# Patient Record
Sex: Male | Born: 1955 | Race: Black or African American | Hispanic: No | Marital: Married | Smoking: Former smoker
Health system: Southern US, Community
[De-identification: ages and names within clinical notes are randomized; demographics above are authoritative.]

## PROBLEM LIST (undated history)

## (undated) DIAGNOSIS — F4310 Post-traumatic stress disorder, unspecified: Secondary | ICD-10-CM

## (undated) DIAGNOSIS — E119 Type 2 diabetes mellitus without complications: Secondary | ICD-10-CM

## (undated) DIAGNOSIS — I10 Essential (primary) hypertension: Secondary | ICD-10-CM

## (undated) DIAGNOSIS — G629 Polyneuropathy, unspecified: Secondary | ICD-10-CM

## (undated) DIAGNOSIS — G4730 Sleep apnea, unspecified: Secondary | ICD-10-CM

## (undated) HISTORY — PX: ABDOMINAL HERNIA REPAIR: SHX539

## (undated) HISTORY — PX: KNEE SURGERY: SHX244

## (undated) HISTORY — PX: BACK SURGERY: SHX140

## (undated) HISTORY — PX: SHOULDER SURGERY: SHX246

---

## 1998-12-05 ENCOUNTER — Encounter: Payer: Self-pay | Admitting: Emergency Medicine

## 1998-12-05 ENCOUNTER — Emergency Department (HOSPITAL_COMMUNITY): Admission: EM | Admit: 1998-12-05 | Discharge: 1998-12-05 | Payer: Self-pay | Admitting: Emergency Medicine

## 1998-12-12 ENCOUNTER — Observation Stay (HOSPITAL_COMMUNITY): Admission: RE | Admit: 1998-12-12 | Discharge: 1998-12-13 | Payer: Self-pay | Admitting: Orthopedic Surgery

## 2002-12-12 ENCOUNTER — Emergency Department (HOSPITAL_COMMUNITY): Admission: EM | Admit: 2002-12-12 | Discharge: 2002-12-12 | Payer: Self-pay | Admitting: Emergency Medicine

## 2013-05-03 ENCOUNTER — Emergency Department (HOSPITAL_COMMUNITY)
Admission: EM | Admit: 2013-05-03 | Discharge: 2013-05-03 | Disposition: A | Discharge status: Home / Self Care | Payer: Medicare Other | Attending: Emergency Medicine | Admitting: Emergency Medicine

## 2013-05-03 ENCOUNTER — Encounter (HOSPITAL_COMMUNITY): Payer: Self-pay | Admitting: Emergency Medicine

## 2013-05-03 DIAGNOSIS — E119 Type 2 diabetes mellitus without complications: Secondary | ICD-10-CM | POA: Insufficient documentation

## 2013-05-03 DIAGNOSIS — R110 Nausea: Secondary | ICD-10-CM | POA: Insufficient documentation

## 2013-05-03 DIAGNOSIS — R739 Hyperglycemia, unspecified: Secondary | ICD-10-CM

## 2013-05-03 DIAGNOSIS — R42 Dizziness and giddiness: Secondary | ICD-10-CM | POA: Insufficient documentation

## 2013-05-03 DIAGNOSIS — R443 Hallucinations, unspecified: Secondary | ICD-10-CM | POA: Insufficient documentation

## 2013-05-03 DIAGNOSIS — F17200 Nicotine dependence, unspecified, uncomplicated: Secondary | ICD-10-CM | POA: Insufficient documentation

## 2013-05-03 DIAGNOSIS — Z794 Long term (current) use of insulin: Secondary | ICD-10-CM | POA: Insufficient documentation

## 2013-05-03 DIAGNOSIS — Z7982 Long term (current) use of aspirin: Secondary | ICD-10-CM | POA: Insufficient documentation

## 2013-05-03 DIAGNOSIS — Z79899 Other long term (current) drug therapy: Secondary | ICD-10-CM | POA: Insufficient documentation

## 2013-05-03 HISTORY — DX: Type 2 diabetes mellitus without complications: E11.9

## 2013-05-03 LAB — GLUCOSE, CAPILLARY: Glucose-Capillary: 264 mg/dL — ABNORMAL HIGH (ref 70–99)

## 2013-05-03 LAB — CBC WITH DIFFERENTIAL/PLATELET
Basophils Absolute: 0.1 10*3/uL (ref 0.0–0.1)
Basophils Relative: 1 % (ref 0–1)
Eosinophils Absolute: 0.3 10*3/uL (ref 0.0–0.7)
Eosinophils Relative: 3 % (ref 0–5)
HCT: 46.5 % (ref 39.0–52.0)
Hemoglobin: 15.9 g/dL (ref 13.0–17.0)
Lymphocytes Relative: 19 % (ref 12–46)
Lymphs Abs: 1.8 10*3/uL (ref 0.7–4.0)
MCH: 28.7 pg (ref 26.0–34.0)
MCHC: 34.2 g/dL (ref 30.0–36.0)
MCV: 83.9 fL (ref 78.0–100.0)
Monocytes Absolute: 0.7 10*3/uL (ref 0.1–1.0)
Monocytes Relative: 7 % (ref 3–12)
Neutro Abs: 6.4 10*3/uL (ref 1.7–7.7)
Neutrophils Relative %: 70 % (ref 43–77)
Platelets: 330 10*3/uL (ref 150–400)
RBC: 5.54 MIL/uL (ref 4.22–5.81)
RDW: 13.5 % (ref 11.5–15.5)
WBC: 9.2 10*3/uL (ref 4.0–10.5)

## 2013-05-03 LAB — POCT I-STAT, CHEM 8
Calcium, Ion: 1.14 mmol/L (ref 1.12–1.23)
Chloride: 102 mEq/L (ref 96–112)
Creatinine, Ser: 0.8 mg/dL (ref 0.50–1.35)
Glucose, Bld: 277 mg/dL — ABNORMAL HIGH (ref 70–99)
HCT: 52 % (ref 39.0–52.0)
Hemoglobin: 17.7 g/dL — ABNORMAL HIGH (ref 13.0–17.0)
Potassium: 4.1 mEq/L (ref 3.5–5.1)

## 2013-05-03 MED ORDER — SODIUM CHLORIDE 0.9 % IV BOLUS (SEPSIS)
1000.0000 mL | Freq: Once | INTRAVENOUS | Status: AC
  Administered 2013-05-03: 1000 mL via INTRAVENOUS

## 2013-05-03 MED ORDER — MECLIZINE HCL 25 MG PO TABS: 25.0000 mg | ORAL_TABLET | Freq: Three times a day (TID) | ORAL | Status: DC | PRN

## 2013-05-03 MED ORDER — ONDANSETRON HCL 4 MG/2ML IJ SOLN
4.0000 mg | Freq: Once | INTRAMUSCULAR | Status: AC
  Administered 2013-05-03: 4 mg via INTRAVENOUS
  Filled 2013-05-03: qty 2

## 2013-05-03 NOTE — ED Notes (Addendum)
He states he is diabetic, and today he is feeling "weak and a little dizzy--I haven't been getting enough sleep".  He is in no distress. CBG in triage 267.

## 2013-05-03 NOTE — ED Provider Notes (Signed)
CSN: [id number]     Arrival date & time 05/03/13  0911 History   First MD Initiated Contact with Patient 05/03/13 0919     Chief Complaint  Patient presents with  . Weakness    HPI The patient works as a Scientist, physiological working the overnight shift. Patient was sleeping in his car this morning, when he woke up he felt shaky, nauseated and weak. Patient checked his blood sugar and it was elevated. He feels generally weak all over. The symptoms are moderate. Has not had any difficulty walking. He denies any trouble with his speech or coordination.   He also denies any pain anywhere. He does not have chest pain or abdominal pain. He has not felt feverish. He has not had a cough or sore throat.   He has noticed some worsening of the symptoms when he turns his head. He does have the sensation that the room is moving.  Past Medical History  Diagnosis Date  . Diabetes mellitus without complication    History reviewed. No pertinent past surgical history. No family history on file. History  Substance Use Topics  . Smoking status: Current Every Day Smoker  . Smokeless tobacco: Not on file  . Alcohol Use: No    Review of Systems  Constitutional: Negative for fever.  HENT: Negative for congestion.   Eyes: Negative for visual disturbance.  Respiratory: Negative for shortness of breath.   Cardiovascular: Negative for chest pain.  Gastrointestinal: Negative for abdominal pain.  Genitourinary: Negative for dysuria.  Musculoskeletal: Negative for arthralgias.  Skin: Negative for rash.  Neurological: Negative for seizures, facial asymmetry and numbness.  Psychiatric/Behavioral: Negative for confusion.  All other systems reviewed and are negative.    Allergies  Gemfibrozil; Oxycontin; and Simvastatin  Home Medications   Current Outpatient Rx  Name  Route  Sig  Dispense  Refill  . aspirin EC 81 MG tablet   Oral   Take 81 mg by mouth daily.         Marland Kitchen buPROPion (WELLBUTRIN SR) 150 MG 12 hr  tablet   Oral   Take 150 mg by mouth 2 (two) times daily.         . carboxymethylcellulose (REFRESH PLUS) 0.5 % SOLN      1 drop 4 (four) times daily as needed (dry eyes).         . Cholecalciferol (VITAMIN D3) 2000 UNITS TABS   Oral   Take 1 tablet by mouth 2 (two) times daily after a meal.         . ibuprofen (ADVIL,MOTRIN) 600 MG tablet   Oral   Take 600 mg by mouth every 6 (six) hours as needed for pain.         Marland Kitchen insulin glargine (LANTUS) 100 UNIT/ML injection   Subcutaneous   Inject 10 Units into the skin at bedtime.         . sildenafil (VIAGRA) 100 MG tablet   Oral   Take 100 mg by mouth daily as needed for erectile dysfunction.         . meclizine (ANTIVERT) 25 MG tablet   Oral   Take 1 tablet (25 mg total) by mouth 3 (three) times daily as needed for dizziness or nausea.   30 tablet   0    BP 161/89  Pulse 70  Temp(Src) 97.7 F (36.5 C) (Oral)  Resp 14  SpO2 100% Physical Exam  Nursing note and vitals reviewed. Constitutional: He is oriented to  person, place, and time. He appears well-developed and well-nourished. No distress.  HENT:  Head: Normocephalic and atraumatic.  Right Ear: External ear normal.  Left Ear: External ear normal.  Mouth/Throat: Oropharynx is clear and moist.  Eyes: Conjunctivae are normal. Right eye exhibits no discharge. Left eye exhibits no discharge. No scleral icterus.  Neck: Neck supple. No tracheal deviation present.  Cardiovascular: Normal rate, regular rhythm and intact distal pulses.   Pulmonary/Chest: Effort normal and breath sounds normal. No stridor. No respiratory distress. He has no wheezes. He has no rales.  Abdominal: Soft. Bowel sounds are normal. He exhibits no distension. There is no tenderness. There is no rebound and no guarding.  Musculoskeletal: He exhibits no edema and no tenderness.  Neurological: He is alert and oriented to person, place, and time. He has normal strength. No sensory deficit.  Cranial nerve deficit:  no gross defecits noted. He exhibits normal muscle tone. He displays no seizure activity. Coordination normal.  No pronator drift bilateral upper extrem, able to hold both legs off bed for 5 seconds, sensation intact in all extremities, no visual field cuts, no left or right sided neglect  Skin: Skin is warm and dry. No rash noted.  Psychiatric: He has a normal mood and affect.    ED Course  Procedures (including critical care time) Labs Review Labs Reviewed  GLUCOSE, CAPILLARY - Abnormal; Notable for the following:    Glucose-Capillary 264 (*)    All other components within normal limits  POCT I-STAT, CHEM 8 - Abnormal; Notable for the following:    Glucose, Bld 277 (*)    Hemoglobin 17.7 (*)    All other components within normal limits  CBC WITH DIFFERENTIAL   Imaging Review No results found.  EKG Interpretation     Ventricular Rate:  80 PR Interval:  185 QRS Duration: 95 QT Interval:  412 QTC Calculation: 475 R Axis:   44 Text Interpretation:  Sinus rhythm Left atrial enlargement Probable anteroseptal infarct, old No significant change since last tracing            MDM   1. Vertigo   2. Hyperglycemia     The patient's blood sugar is mildly elevated but I do not feel this would be the cause of his symptoms. Patient's EKG is unremarkable. I doubt that these symptoms are some type of anginal equivalent.  Patient has a normal neurologic exam, I doubt TIA or stroke.  Is possible that the symptoms may be related to peripheral vertigo. Patient does admit that he has more nausea when turning his head.  At this time there does not appear to be any evidence of an acute emergency medical condition and the patient appears stable for discharge with appropriate outpatient follow up.    Celene Kras, MD 05/03/13 1045

## 2013-05-11 ENCOUNTER — Encounter (HOSPITAL_COMMUNITY): Payer: Self-pay | Admitting: Emergency Medicine

## 2013-05-11 ENCOUNTER — Emergency Department (HOSPITAL_COMMUNITY): Payer: Medicare Other

## 2013-05-11 ENCOUNTER — Emergency Department (HOSPITAL_COMMUNITY)
Admission: EM | Admit: 2013-05-11 | Discharge: 2013-05-11 | Disposition: A | Discharge status: Home / Self Care | Payer: Medicare Other | Attending: Emergency Medicine | Admitting: Emergency Medicine

## 2013-05-11 DIAGNOSIS — Z794 Long term (current) use of insulin: Secondary | ICD-10-CM | POA: Insufficient documentation

## 2013-05-11 DIAGNOSIS — I10 Essential (primary) hypertension: Secondary | ICD-10-CM | POA: Insufficient documentation

## 2013-05-11 DIAGNOSIS — Y929 Unspecified place or not applicable: Secondary | ICD-10-CM | POA: Insufficient documentation

## 2013-05-11 DIAGNOSIS — IMO0002 Reserved for concepts with insufficient information to code with codable children: Secondary | ICD-10-CM | POA: Insufficient documentation

## 2013-05-11 DIAGNOSIS — X500XXA Overexertion from strenuous movement or load, initial encounter: Secondary | ICD-10-CM | POA: Insufficient documentation

## 2013-05-11 DIAGNOSIS — E119 Type 2 diabetes mellitus without complications: Secondary | ICD-10-CM | POA: Insufficient documentation

## 2013-05-11 DIAGNOSIS — Z79899 Other long term (current) drug therapy: Secondary | ICD-10-CM | POA: Insufficient documentation

## 2013-05-11 DIAGNOSIS — Y93E2 Activity, laundry: Secondary | ICD-10-CM | POA: Insufficient documentation

## 2013-05-11 DIAGNOSIS — Z7982 Long term (current) use of aspirin: Secondary | ICD-10-CM | POA: Insufficient documentation

## 2013-05-11 DIAGNOSIS — M549 Dorsalgia, unspecified: Secondary | ICD-10-CM

## 2013-05-11 LAB — CBC WITH DIFFERENTIAL/PLATELET
Basophils Absolute: 0.1 10*3/uL (ref 0.0–0.1)
Basophils Relative: 1 % (ref 0–1)
Eosinophils Absolute: 0.6 10*3/uL (ref 0.0–0.7)
Eosinophils Relative: 6 % — ABNORMAL HIGH (ref 0–5)
HCT: 46.2 % (ref 39.0–52.0)
Hemoglobin: 15.9 g/dL (ref 13.0–17.0)
Lymphs Abs: 2.7 10*3/uL (ref 0.7–4.0)
MCH: 28.9 pg (ref 26.0–34.0)
MCHC: 34.4 g/dL (ref 30.0–36.0)
MCV: 83.8 fL (ref 78.0–100.0)
Monocytes Absolute: 0.8 10*3/uL (ref 0.1–1.0)
Monocytes Relative: 8 % (ref 3–12)
Neutro Abs: 5.9 10*3/uL (ref 1.7–7.7)
RDW: 13.1 % (ref 11.5–15.5)

## 2013-05-11 LAB — BASIC METABOLIC PANEL
BUN: 13 mg/dL (ref 6–23)
Calcium: 9.8 mg/dL (ref 8.4–10.5)
Creatinine, Ser: 0.78 mg/dL (ref 0.50–1.35)
GFR calc Af Amer: >90 mL/min (ref >90)
GFR calc non Af Amer: >90 mL/min (ref >90)
Potassium: 4 mEq/L (ref 3.5–5.1)

## 2013-05-11 LAB — D-DIMER, QUANTITATIVE: D-Dimer, Quant: <0.27 ug/mL-FEU (ref 0.00–0.48)

## 2013-05-11 MED ORDER — KETOROLAC TROMETHAMINE 60 MG/2ML IM SOLN
60.0000 mg | Freq: Once | INTRAMUSCULAR | Status: AC
  Administered 2013-05-11: 60 mg via INTRAMUSCULAR
  Filled 2013-05-11: qty 2

## 2013-05-11 MED ORDER — KETOROLAC TROMETHAMINE 10 MG PO TABS: 10.0000 mg | ORAL_TABLET | Freq: Four times a day (QID) | ORAL | Status: DC | PRN

## 2013-05-11 NOTE — ED Provider Notes (Signed)
CSN: [id number]     Arrival date & time 05/11/13  2028 History   First MD Initiated Contact with Patient 05/11/13 2041     Chief Complaint  Patient presents with  . Back Pain   (Consider location/radiation/quality/duration/timing/severity/associated sxs/prior Treatment) HPI Comments: Patient is a 57 year old male with history of hypertension, diabetes, hyperlipidemia who presents today with sudden onset of right upper back pain. He reports that this past weekend he moved from [place] to [place] and is experiencing normal soreness in his back from the move. Tonight he was doing a load of laundry that exacerbated this pain. He is now experiencing a sharp pain in his right upper back that radiates to his left back. The pain is worse with inspiration and nontender to palpation. He has tried many different positions, but cannot find a position that is comfortable for him. He has intermittent shortness of breath that resolved spontaneously. He had approximately 3 episodes of shortness of breath while he was driving to the emergency department tonight. He denies any history of DVT or PE, leg swelling, any recent long trips, recent surgeries. He reports that his diabetes was well controlled, but has not taken his Lantus and one week. He restarted his Lantus yesterday. He denies chest pain.  Patient is a 57 y.o. male presenting with back pain. The history is provided by the patient. No language interpreter was used.  Back Pain Associated symptoms: no abdominal pain, no chest pain and no fever     Past Medical History  Diagnosis Date  . Diabetes mellitus without complication    Past Surgical History  Procedure Laterality Date  . Shoulder surgery    . Knee surgery    . Back surgery     History reviewed. No pertinent family history. History  Substance Use Topics  . Smoking status: Current Every Day Smoker  . Smokeless tobacco: Not on file  . Alcohol Use: No    Review of Systems   Constitutional: Negative for fever and chills.  Respiratory: Positive for shortness of breath.   Cardiovascular: Negative for chest pain.  Gastrointestinal: Negative for nausea, vomiting and abdominal pain.  Musculoskeletal: Positive for back pain.  All other systems reviewed and are negative.    Allergies  Gemfibrozil; Oxycontin; and Simvastatin  Home Medications   Current Outpatient Rx  Name  Route  Sig  Dispense  Refill  . aspirin EC 81 MG tablet   Oral   Take 81 mg by mouth daily.         Marland Kitchen buPROPion (WELLBUTRIN SR) 150 MG 12 hr tablet   Oral   Take 150 mg by mouth 2 (two) times daily.         . carboxymethylcellulose (REFRESH PLUS) 0.5 % SOLN      1 drop 4 (four) times daily as needed (dry eyes).         . Cholecalciferol (VITAMIN D3) 2000 UNITS TABS   Oral   Take 1 tablet by mouth 2 (two) times daily after a meal.         . ibuprofen (ADVIL,MOTRIN) 600 MG tablet   Oral   Take 600 mg by mouth every 6 (six) hours as needed for pain.         Marland Kitchen insulin glargine (LANTUS) 100 UNIT/ML injection   Subcutaneous   Inject 15 Units into the skin at bedtime.          . meclizine (ANTIVERT) 25 MG tablet   Oral   Take  1 tablet (25 mg total) by mouth 3 (three) times daily as needed for dizziness or nausea.   30 tablet   0   . sildenafil (VIAGRA) 100 MG tablet   Oral   Take 100 mg by mouth daily as needed for erectile dysfunction.          BP 139/98  Temp(Src) 98.3 F (36.8 C) (Oral)  Resp 16  SpO2 100% Physical Exam  Nursing note and vitals reviewed. Constitutional: He is oriented to person, place, and time. He appears well-developed and well-nourished. No distress.  HENT:  Head: Normocephalic and atraumatic.  Right Ear: External ear normal.  Left Ear: External ear normal.  Nose: Nose normal.  Eyes: Conjunctivae are normal.  Neck: Normal range of motion. No tracheal deviation present.  Cardiovascular: Normal rate, regular rhythm, normal heart  sounds, intact distal pulses and normal pulses.   Pulses:      Radial pulses are 2+ on the right side, and 2+ on the left side.       Posterior tibial pulses are 2+ on the right side, and 2+ on the left side.  Pulmonary/Chest: Effort normal and breath sounds normal. No stridor.  Abdominal: Soft. He exhibits no distension. There is no tenderness.  Musculoskeletal: Normal range of motion.  No tenderness to palpation   Neurological: He is alert and oriented to person, place, and time.  Skin: Skin is warm and dry. He is not diaphoretic.  Psychiatric: He has a normal mood and affect. His behavior is normal.    ED Course  Procedures (including critical care time) Labs Review Labs Reviewed  CBC WITH DIFFERENTIAL - Abnormal; Notable for the following:    Eosinophils Relative 6 (*)    All other components within normal limits  BASIC METABOLIC PANEL - Abnormal; Notable for the following:    Sodium 132 (*)    Glucose, Bld 306 (*)    All other components within normal limits  D-DIMER, QUANTITATIVE   Imaging Review Dg Chest 2 View  05/11/2013   CLINICAL DATA:  Shortness of breath  EXAM: CHEST  2 VIEW  COMPARISON:  None.  FINDINGS: The cardiac and mediastinal silhouettes are within normal limits.  The lungs are normally inflated. No airspace consolidation, pleural effusion, or pulmonary edema is identified. There is no pneumothorax. Bibasilar linear opacities are most compatible with atelectasis.  No acute osseous abnormality identified.  IMPRESSION: Bibasilar subsegmental atelectasis. No active cardiopulmonary process.   Electronically Signed   By: Rise Mu M.D.   On: 05/11/2013 22:14    EKG Interpretation   None       MDM   1. Back pain    Patient presents with back pain after straining his back doing laundry earlier today. The pain is not reproducible with palpation. It is worse with inspiration. Initially I was concerned for a PE. His d-dimer is negative. Patient reports  100% resolution of his symptoms after a Toradol injection. He is anxious to go home. I discussed this case with Dr. Estell Harpin who agrees with plan. Strict return instructions given. Vital signs stable for discharge. Patient / Family / Caregiver informed of clinical course, understand medical decision-making process, and agree with plan.     Mora Bellman, PA-C 05/11/13 2358

## 2013-05-11 NOTE — ED Notes (Signed)
Patient is alert and oriented x3.  He states that he was doing some moving this weekend and  Tonight he was doing laundry and bent over and felt a sharp pain in the middle of his pain that  He states is breath taking.  Currently he rates his pain at an intermittent pain 7 of 10.

## 2013-05-12 NOTE — ED Provider Notes (Signed)
Medical screening examination/treatment/procedure(s) were performed by non-physician practitioner and as supervising physician I was immediately available for consultation/collaboration.  EKG Interpretation   None         Tahjae Clausing L Delisha Peaden, MD 05/12/13 1544 

## 2015-02-17 ENCOUNTER — Emergency Department (HOSPITAL_COMMUNITY): Payer: Medicare Other

## 2015-02-17 ENCOUNTER — Encounter (HOSPITAL_COMMUNITY): Payer: Self-pay | Admitting: Emergency Medicine

## 2015-02-17 ENCOUNTER — Emergency Department (HOSPITAL_COMMUNITY)
Admission: EM | Admit: 2015-02-17 | Discharge: 2015-02-18 | Disposition: A | Discharge status: Home / Self Care | Payer: Medicare Other | Attending: Emergency Medicine | Admitting: Emergency Medicine

## 2015-02-17 DIAGNOSIS — Z9889 Other specified postprocedural states: Secondary | ICD-10-CM | POA: Diagnosis not present

## 2015-02-17 DIAGNOSIS — K439 Ventral hernia without obstruction or gangrene: Secondary | ICD-10-CM | POA: Insufficient documentation

## 2015-02-17 DIAGNOSIS — E119 Type 2 diabetes mellitus without complications: Secondary | ICD-10-CM | POA: Diagnosis not present

## 2015-02-17 DIAGNOSIS — Z794 Long term (current) use of insulin: Secondary | ICD-10-CM | POA: Diagnosis not present

## 2015-02-17 DIAGNOSIS — Z720 Tobacco use: Secondary | ICD-10-CM | POA: Insufficient documentation

## 2015-02-17 DIAGNOSIS — R0602 Shortness of breath: Secondary | ICD-10-CM | POA: Diagnosis not present

## 2015-02-17 DIAGNOSIS — Z79899 Other long term (current) drug therapy: Secondary | ICD-10-CM | POA: Diagnosis not present

## 2015-02-17 LAB — COMPREHENSIVE METABOLIC PANEL
ALT: 32 U/L (ref 17–63)
ANION GAP: 8 (ref 5–15)
AST: 26 U/L (ref 15–41)
Albumin: 4.2 g/dL (ref 3.5–5.0)
Alkaline Phosphatase: 91 U/L (ref 38–126)
BUN: 13 mg/dL (ref 6–20)
CO2: 23 mmol/L (ref 22–32)
Calcium: 9.1 mg/dL (ref 8.9–10.3)
Chloride: 105 mmol/L (ref 101–111)
Creatinine, Ser: 0.86 mg/dL (ref 0.61–1.24)
GFR calc Af Amer: >60 mL/min (ref >60)
GLUCOSE: 249 mg/dL — AB (ref 65–99)
POTASSIUM: 4 mmol/L (ref 3.5–5.1)
Sodium: 136 mmol/L (ref 135–145)
Total Bilirubin: 0.4 mg/dL (ref 0.3–1.2)
Total Protein: 7.3 g/dL (ref 6.5–8.1)

## 2015-02-17 LAB — URINALYSIS, ROUTINE W REFLEX MICROSCOPIC
Bilirubin Urine: NEGATIVE
Glucose, UA: >1000 mg/dL — AB
Hgb urine dipstick: NEGATIVE
Ketones, ur: NEGATIVE mg/dL
Leukocytes, UA: NEGATIVE
Nitrite: NEGATIVE
PH: 6 (ref 5.0–8.0)
Protein, ur: NEGATIVE mg/dL
SPECIFIC GRAVITY, URINE: 1.029 (ref 1.005–1.030)
UROBILINOGEN UA: 2 mg/dL — AB (ref 0.0–1.0)

## 2015-02-17 LAB — CBC
HCT: 44.1 % (ref 39.0–52.0)
Hemoglobin: 14.8 g/dL (ref 13.0–17.0)
MCH: 28.2 pg (ref 26.0–34.0)
MCHC: 33.6 g/dL (ref 30.0–36.0)
MCV: 84 fL (ref 78.0–100.0)
Platelets: 364 10*3/uL (ref 150–400)
RBC: 5.25 MIL/uL (ref 4.22–5.81)
RDW: 13.5 % (ref 11.5–15.5)
WBC: 9.6 10*3/uL (ref 4.0–10.5)

## 2015-02-17 LAB — CBG MONITORING, ED: Glucose-Capillary: 203 mg/dL — ABNORMAL HIGH (ref 65–99)

## 2015-02-17 LAB — LIPASE, BLOOD: Lipase: 27 U/L (ref 22–51)

## 2015-02-17 LAB — URINE MICROSCOPIC-ADD ON

## 2015-02-17 LAB — TROPONIN I

## 2015-02-17 LAB — BRAIN NATRIURETIC PEPTIDE: B NATRIURETIC PEPTIDE 5: 40.7 pg/mL (ref 0.0–100.0)

## 2015-02-17 NOTE — ED Notes (Signed)
Pt from home c/o abdominal hernia that causes shortness of breath when laying down and bending forward. Pt is NAD distress. Pt is not short of breath currently and he denies pain.

## 2015-02-17 NOTE — ED Notes (Signed)
CBG registered 203 on ed glucometer

## 2015-02-17 NOTE — Discharge Instructions (Signed)
1. Medications: usual home medications 2. Treatment: rest, drink plenty of fluids,  3. Follow Up: Please followup with your primary doctor in 2-3 days for discussion of your diagnoses and further evaluation after today's visit; if you do not have a primary care doctor use the resource guide provided to find one; Please return to the ER for worsening symptoms, associated chest pain;  Please also follow-up with [place] surgery.    Hernia A hernia occurs when an internal organ pushes out through a weak spot in the abdominal wall. Hernias most commonly occur in the groin and around the navel. Hernias often can be pushed back into place (reduced). Most hernias tend to get worse over time. Some abdominal hernias can get stuck in the opening (irreducible or incarcerated hernia) and cannot be reduced. An irreducible abdominal hernia which is tightly squeezed into the opening is at risk for impaired blood supply (strangulated hernia). A strangulated hernia is a medical emergency. Because of the risk for an irreducible or strangulated hernia, surgery may be recommended to repair a hernia. CAUSES   Heavy lifting.  Prolonged coughing.  Straining to have a bowel movement.  A cut (incision) made during an abdominal surgery. HOME CARE INSTRUCTIONS   Bed rest is not required. You may continue your normal activities.  Avoid lifting more than 10 pounds (4.5 kg) or straining.  Cough gently. If you are a smoker it is best to stop. Even the best hernia repair can break down with the continual strain of coughing. Even if you do not have your hernia repaired, a cough will continue to aggravate the problem.  Do not wear anything tight over your hernia. Do not try to keep it in with an outside bandage or truss. These can damage abdominal contents if they are trapped within the hernia sac.  Eat a normal diet.  Avoid constipation. Straining over long periods of time will increase hernia size and encourage  breakdown of repairs. If you cannot do this with diet alone, stool softeners may be used. SEEK IMMEDIATE MEDICAL CARE IF:   You have a fever.  You develop increasing abdominal pain.  You feel nauseous or vomit.  Your hernia is stuck outside the abdomen, looks discolored, feels hard, or is tender.  You have any changes in your bowel habits or in the hernia that are unusual for you.  You have increased pain or swelling around the hernia.  You cannot push the hernia back in place by applying gentle pressure while lying down. MAKE SURE YOU:   Understand these instructions.  Will watch your condition.  Will get help right away if you are not doing well or get worse. Document Released: 06/22/2005 Document Revised: 09/14/2011 Document Reviewed: 02/09/2008 [facility] Patient Information 2015 [place], [place]. This information is not intended to replace advice given to you by your health care provider. Make sure you discuss any questions you have with your health care provider.

## 2015-02-17 NOTE — ED Provider Notes (Signed)
CSN: [id number]     Arrival date & time 02/17/15  1957 History   First MD Initiated Contact with Patient 02/17/15 2105     Chief Complaint  Patient presents with  . Hernia  . Shortness of Breath     (Consider location/radiation/quality/duration/timing/severity/associated sxs/prior Treatment) The history is provided by the patient and medical records. No language interpreter was used.     Stephan Nelis Chauncey is a 59 y.o. male  with a hx of IDDM presents to the Emergency Department complaining of gradual, persistent, progressively worsening abdominal hernia onset 1 year. Associated symptoms include shortness of breath onset 3 weeks ago which occurs when lifting even light items, and even walking.  Pt reports this lasts 5-10 seconds and then resolves spontaneously until he makes another aggravating movement.  Pt denies pain in there hernia.  Pt reports the hernia gets larger with sitting up.  Pt reports that he has early satiety and feelings of fullness. Nothing makes it better.  Pt denies fever, chills, headache, neck pain, chest pain, abd pain, N/V/D, weakness, dizziness, syncope.  Pt reports he came in tonight because he feels uncomfortably full at this time.    PCP: Rondell Reams, MD - [facility] in [place].    Past Medical History  Diagnosis Date  . Diabetes mellitus without complication    Past Surgical History  Procedure Laterality Date  . Shoulder surgery    . Knee surgery    . Back surgery    . Abdominal hernia repair     No family history on file. Social History  Substance Use Topics  . Smoking status: Current Every Day Smoker  . Smokeless tobacco: None  . Alcohol Use: No    Review of Systems  Constitutional: Negative for fever, diaphoresis, appetite change, fatigue and unexpected weight change.  HENT: Negative for mouth sores.   Eyes: Negative for visual disturbance.  Respiratory: Positive for shortness of breath (intermittent). Negative for cough, chest tightness and wheezing.    Cardiovascular: Negative for chest pain.  Gastrointestinal: Negative for nausea, vomiting, abdominal pain, diarrhea and constipation.       Abdominal wall hernia  Endocrine: Negative for polydipsia, polyphagia and polyuria.  Genitourinary: Negative for dysuria, urgency, frequency and hematuria.  Musculoskeletal: Negative for back pain and neck stiffness.  Skin: Negative for rash.  Allergic/Immunologic: Negative for immunocompromised state.  Neurological: Negative for syncope, light-headedness and headaches.  Hematological: Does not bruise/bleed easily.  Psychiatric/Behavioral: Negative for sleep disturbance. The patient is not nervous/anxious.       Allergies  Gemfibrozil; Oxycontin; and Simvastatin  Home Medications   Prior to Admission medications   Medication Sig Start Date End Date Taking? Authorizing Provider  ATORVASTATIN CALCIUM PO Take 1 tablet by mouth daily.   Yes Historical Provider, MD  buPROPion (WELLBUTRIN SR) 150 MG 12 hr tablet Take 150 mg by mouth 2 (two) times daily.   Yes Historical Provider, MD  carboxymethylcellulose (REFRESH PLUS) 0.5 % SOLN 1 drop 4 (four) times daily as needed (dry eyes).   Yes Historical Provider, MD  Cholecalciferol (VITAMIN D3) 2000 UNITS TABS Take 1 tablet by mouth 2 (two) times daily after a meal.   Yes Historical Provider, MD  GLIPIZIDE PO Take 1 tablet by mouth 2 (two) times daily.   Yes Historical Provider, MD  ibuprofen (ADVIL,MOTRIN) 600 MG tablet Take 600 mg by mouth every 6 (six) hours as needed for pain.   Yes Historical Provider, MD  insulin glargine (LANTUS) 100 UNIT/ML injection Inject 50  Units into the skin at bedtime.    Yes Historical Provider, MD  meclizine (ANTIVERT) 25 MG tablet Take 1 tablet (25 mg total) by mouth 3 (three) times daily as needed for dizziness or nausea. 05/03/13  Yes Dorie Rank, MD  METOPROLOL TARTRATE PO Take 1 tablet by mouth 2 (two) times daily.   Yes Historical Provider, MD  sildenafil (VIAGRA) 100 MG  tablet Take 100 mg by mouth daily as needed for erectile dysfunction.   Yes Historical Provider, MD   BP 161/86 mmHg  Pulse 67  Temp(Src) 97.8 F (36.6 C) (Oral)  Resp 12  SpO2 95% Physical Exam  Constitutional: He appears well-developed and well-nourished. No distress.  Awake, alert, nontoxic appearance  HENT:  Head: Normocephalic and atraumatic.  Mouth/Throat: Oropharynx is clear and moist. No oropharyngeal exudate.  Eyes: Conjunctivae are normal. No scleral icterus.  Neck: Normal range of motion. Neck supple.  Cardiovascular: Normal rate, regular rhythm, normal heart sounds and intact distal pulses.   Pulmonary/Chest: Effort normal and breath sounds normal. No respiratory distress. He has no wheezes.  Equal chest expansion  Abdominal: Soft. Bowel sounds are normal. He exhibits no distension and no mass. There is no tenderness. There is no rebound and no guarding.  Large ventral wall hernia easily reducible without tenderness to palpation Abdomen soft and nontender No CVA tenderness  Musculoskeletal: Normal range of motion. He exhibits no edema.  Neurological: He is alert.  Speech is clear and goal oriented Moves extremities without ataxia  Skin: Skin is warm and dry. He is not diaphoretic.  Psychiatric: He has a normal mood and affect.  Nursing note and vitals reviewed.   ED Course  Procedures (including critical care time) Labs Review Labs Reviewed  COMPREHENSIVE METABOLIC PANEL - Abnormal; Notable for the following:    Glucose, Bld 249 (*)    All other components within normal limits  URINALYSIS, ROUTINE W REFLEX MICROSCOPIC (NOT AT [facility]) - Abnormal; Notable for the following:    Glucose, UA >1000 (*)    Urobilinogen, UA 2.0 (*)    All other components within normal limits  CBG MONITORING, ED - Abnormal; Notable for the following:    Glucose-Capillary 203 (*)    All other components within normal limits  LIPASE, BLOOD  CBC  BRAIN NATRIURETIC PEPTIDE  TROPONIN I   URINE MICROSCOPIC-ADD ON    Imaging Review Dg Chest 2 View  02/17/2015   CLINICAL DATA:  Shortness of breath for 1 week  EXAM: CHEST - 2 VIEW  COMPARISON:  05/11/2013  FINDINGS: Cardiac shadow is within normal limits. The lungs are well aerated bilaterally. No focal infiltrate or sizable effusion is seen. Degenerative changes of the thoracic spine are noted.  IMPRESSION: No active disease.   Electronically Signed   By: Inez Catalina M.D.   On: 02/17/2015 20:29   I, Caetano Oberhaus, Jarrett Soho, personally reviewed and evaluated these images and lab results as part of my medical decision-making.   EKG Interpretation None      MDM   Final diagnoses:  SOB (shortness of breath)  Abdominal wall hernia   Kenji A Maglione presents with complaints of shortness of breath and abdominal fullness in addition to large abdominal wall hernia. Hernia is soft and easily reproducible. No evidence of incarceration or strangulation. Patient shortness of breath is intermittent and is never associated with chest pain, diaphoresis, near syncope, nausea or diaphoresis. Highly doubt ACS. EKG without evidence of ischemia.   11:53 PM Pt labs reassuring without  evidence of acute coronary syndrome, congestive heart failure. Patient is without tachycardia or risk factors for DVT or PE. D-dimer has not been processed. Patient wishes to be discharged home without waiting for this test.  Highly doubt PE. Check return precautions given. Patient ambulatory to the emergency department without decreasing oxygen saturation, tachycardia, dyspnea on exertion or feelings of shortness of breath.  BP 161/86 mmHg  Pulse 67  Temp(Src) 97.8 F (36.6 C) (Oral)  Resp 12  SpO2 95%    Abigail Butts, PA-C 02/17/15 2356  Merrily Pew, MD 02/19/15 9169

## 2015-02-17 NOTE — ED Notes (Signed)
O2 remained around 95 when ambulating. dropped to 91 when turning around to go back to room.

## 2015-02-27 ENCOUNTER — Other Ambulatory Visit: Payer: Self-pay | Admitting: General Surgery

## 2015-02-27 DIAGNOSIS — M6208 Separation of muscle (nontraumatic), other site: Secondary | ICD-10-CM

## 2015-02-27 NOTE — Addendum Note (Signed)
Addended by: Gurney Maxin A on: 02/27/2015 12:46 PM   Modules accepted: Orders

## 2015-02-28 ENCOUNTER — Other Ambulatory Visit: Payer: Self-pay

## 2015-02-28 DIAGNOSIS — M6208 Separation of muscle (nontraumatic), other site: Secondary | ICD-10-CM

## 2015-03-01 ENCOUNTER — Other Ambulatory Visit: Payer: Self-pay | Admitting: General Surgery

## 2015-03-01 ENCOUNTER — Ambulatory Visit
Admission: RE | Admit: 2015-03-01 | Discharge: 2015-03-01 | Disposition: A | Discharge status: Home / Self Care | Payer: Medicare Other | Source: Ambulatory Visit | Attending: General Surgery | Admitting: General Surgery

## 2015-03-01 ENCOUNTER — Other Ambulatory Visit: Payer: Medicare Other

## 2015-03-01 DIAGNOSIS — M6208 Separation of muscle (nontraumatic), other site: Secondary | ICD-10-CM

## 2015-03-01 MED ORDER — IOPAMIDOL (ISOVUE-300) INJECTION 61%
125.0000 mL | Freq: Once | INTRAVENOUS | Status: AC | PRN
  Administered 2015-03-01: 125 mL via INTRAVENOUS

## 2015-05-17 ENCOUNTER — Emergency Department (HOSPITAL_COMMUNITY)
Admission: EM | Admit: 2015-05-17 | Discharge: 2015-05-17 | Disposition: A | Discharge status: Home / Self Care | Payer: Medicare Other | Attending: Emergency Medicine | Admitting: Emergency Medicine

## 2015-05-17 ENCOUNTER — Encounter (HOSPITAL_COMMUNITY): Payer: Self-pay | Admitting: Emergency Medicine

## 2015-05-17 DIAGNOSIS — Y998 Other external cause status: Secondary | ICD-10-CM | POA: Insufficient documentation

## 2015-05-17 DIAGNOSIS — E119 Type 2 diabetes mellitus without complications: Secondary | ICD-10-CM | POA: Insufficient documentation

## 2015-05-17 DIAGNOSIS — Z79899 Other long term (current) drug therapy: Secondary | ICD-10-CM | POA: Insufficient documentation

## 2015-05-17 DIAGNOSIS — Z720 Tobacco use: Secondary | ICD-10-CM | POA: Insufficient documentation

## 2015-05-17 DIAGNOSIS — S39012A Strain of muscle, fascia and tendon of lower back, initial encounter: Secondary | ICD-10-CM | POA: Diagnosis not present

## 2015-05-17 DIAGNOSIS — Y92410 Unspecified street and highway as the place of occurrence of the external cause: Secondary | ICD-10-CM | POA: Insufficient documentation

## 2015-05-17 DIAGNOSIS — Z794 Long term (current) use of insulin: Secondary | ICD-10-CM | POA: Insufficient documentation

## 2015-05-17 DIAGNOSIS — Y9389 Activity, other specified: Secondary | ICD-10-CM | POA: Insufficient documentation

## 2015-05-17 DIAGNOSIS — S3992XA Unspecified injury of lower back, initial encounter: Secondary | ICD-10-CM | POA: Diagnosis present

## 2015-05-17 DIAGNOSIS — T148XXA Other injury of unspecified body region, initial encounter: Secondary | ICD-10-CM

## 2015-05-17 HISTORY — DX: Sleep apnea, unspecified: G47.30

## 2015-05-17 HISTORY — DX: Essential (primary) hypertension: I10

## 2015-05-17 HISTORY — DX: Polyneuropathy, unspecified: G62.9

## 2015-05-17 MED ORDER — METHOCARBAMOL 500 MG PO TABS: 500.0000 mg | ORAL_TABLET | Freq: Four times a day (QID) | ORAL | Status: DC
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
## 2016-09-17 ENCOUNTER — Emergency Department (HOSPITAL_COMMUNITY)
Admission: EM | Admit: 2016-09-17 | Discharge: 2016-09-17 | Disposition: A | Discharge status: Home / Self Care | Payer: Medicare Other | Attending: Emergency Medicine | Admitting: Emergency Medicine

## 2016-09-17 ENCOUNTER — Encounter (HOSPITAL_COMMUNITY): Payer: Self-pay | Admitting: Emergency Medicine

## 2016-09-17 DIAGNOSIS — K047 Periapical abscess without sinus: Secondary | ICD-10-CM | POA: Insufficient documentation

## 2016-09-17 DIAGNOSIS — E1140 Type 2 diabetes mellitus with diabetic neuropathy, unspecified: Secondary | ICD-10-CM | POA: Diagnosis not present

## 2016-09-17 DIAGNOSIS — F17200 Nicotine dependence, unspecified, uncomplicated: Secondary | ICD-10-CM | POA: Diagnosis not present

## 2016-09-17 DIAGNOSIS — I10 Essential (primary) hypertension: Secondary | ICD-10-CM | POA: Diagnosis not present

## 2016-09-17 DIAGNOSIS — Z794 Long term (current) use of insulin: Secondary | ICD-10-CM | POA: Diagnosis not present

## 2016-09-17 DIAGNOSIS — Z79899 Other long term (current) drug therapy: Secondary | ICD-10-CM | POA: Diagnosis not present

## 2016-09-17 DIAGNOSIS — R220 Localized swelling, mass and lump, head: Secondary | ICD-10-CM | POA: Diagnosis present

## 2016-09-17 MED ORDER — PENICILLIN V POTASSIUM 500 MG PO TABS: 500.0000 mg | ORAL_TABLET | Freq: Four times a day (QID) | ORAL | 0 refills | Status: AC

## 2016-09-17 MED ORDER — TRAMADOL HCL 50 MG PO TABS: 50.0000 mg | ORAL_TABLET | Freq: Four times a day (QID) | ORAL | 0 refills | Status: DC | PRN

## 2016-09-17 NOTE — Discharge Instructions (Signed)
Please read the attached information.  Please use medication as directed.  Please follow-up with dentist for further evaluation and management.  Return immediately if any new or worsening signs or symptoms present.

## 2016-09-17 NOTE — ED Provider Notes (Signed)
[facility] DEPT Provider Note   CSN: [id number] Arrival date & time: 09/17/16  1459  By signing my name below, I, Neta Mends, attest that this documentation has been prepared under the direction and in the presence of [employer], PA-C. Electronically Signed: Neta Mends, ED Scribe. 09/17/2016. 4:08 PM.   History   Chief Complaint Chief Complaint  Patient presents with  . Mouth Lesions   The history is provided by the patient. No language interpreter was used.   HPI Comments:  [name] is a 61 y.o. male with PMHx of DM and HTN who presents to the Emergency Department complaining of moderate, gradually worsening area of swelling to the mouth x several weeks. Pt complains of associated facial swelling on the right side. Pt rates his pain at 10/10. Pt does not have a dentist, and notes that he only has 8 teeth remaining. Pt has used ibuprofen with no relief. Pt notes a known allergy to oxycodone. Pt denies other associated symptoms.   Past Medical History:  Diagnosis Date  . Diabetes mellitus without complication ([place])   . Hypertension   . Neuropathy ([place])   . Sleep apnea     There are no active problems to display for this patient.   Past Surgical History:  Procedure Laterality Date  . ABDOMINAL HERNIA REPAIR    . BACK SURGERY    . KNEE SURGERY    . SHOULDER SURGERY         Home Medications    Prior to Admission medications   Medication Sig Start Date End Date Taking? Authorizing Provider  ATORVASTATIN CALCIUM PO Take 1 tablet by mouth daily.    Historical Provider, MD  buPROPion (WELLBUTRIN SR) 150 MG 12 hr tablet Take 150 mg by mouth 2 (two) times daily.    Historical Provider, MD  carboxymethylcellulose (REFRESH PLUS) 0.5 % SOLN 1 drop 4 (four) times daily as needed (dry eyes).    Historical Provider, MD  Cholecalciferol (VITAMIN D3) 2000 UNITS TABS Take 1 tablet by mouth 2 (two) times daily after a meal.    Historical Provider, MD    GLIPIZIDE PO Take 1 tablet by mouth 2 (two) times daily.    Historical Provider, MD  ibuprofen (ADVIL,MOTRIN) 600 MG tablet Take 600 mg by mouth every 6 (six) hours as needed for pain.    Historical Provider, MD  insulin glargine (LANTUS) 100 UNIT/ML injection Inject 50 Units into the skin at bedtime.     Historical Provider, MD  meclizine (ANTIVERT) 25 MG tablet Take 1 tablet (25 mg total) by mouth 3 (three) times daily as needed for dizziness or nausea. 05/03/13   Dorie Rank, MD  methocarbamol (ROBAXIN) 500 MG tablet Take 1 tablet (500 mg total) by mouth 4 (four) times daily. 05/17/15   Carlisle Cater, PA-C  METOPROLOL TARTRATE PO Take 1 tablet by mouth 2 (two) times daily.    Historical Provider, MD  penicillin v potassium (VEETID) 500 MG tablet Take 1 tablet (500 mg total) by mouth 4 (four) times daily. 09/17/16 09/27/16  Okey Regal, PA-C  sildenafil (VIAGRA) 100 MG tablet Take 100 mg by mouth daily as needed for erectile dysfunction.    Historical Provider, MD  traMADol (ULTRAM) 50 MG tablet Take 1 tablet (50 mg total) by mouth every 6 (six) hours as needed. 09/17/16   Okey Regal, PA-C    Family History No family history on file.  Social History Social History  Substance Use Topics  .  Smoking status: Current Every Day Smoker  . Smokeless tobacco: Never Used  . Alcohol use No     Allergies   Gemfibrozil; Oxycontin [oxycodone hcl]; and Simvastatin   Review of Systems Review of Systems  Constitutional: Negative for fever.  HENT: Positive for dental problem and facial swelling.      Physical Exam Updated Vital Signs BP 136/88 (BP Location: Right Arm)   Pulse 88   Temp 98 F (36.7 C) (Oral)   Resp 17   Ht 6' (1.829 m)   Wt 106.1 kg   SpO2 99%   BMI 31.74 kg/m   Physical Exam  Constitutional: He is oriented to person, place, and time. He appears well-developed and well-nourished.  HENT:  Head: Normocephalic and atraumatic.  Poor dentition with very little teeth  left.  Patient has swelling to the right anterior lateral gumline with no fluctuance or masses.  For the most soft, remainder oropharynx is clear.  Neck is supple full active range of motion.  No signs of deep space infection.  Eyes: Conjunctivae are normal. Pupils are equal, round, and reactive to light. Right eye exhibits no discharge. Left eye exhibits no discharge. No scleral icterus.  Neck: Normal range of motion. No JVD present. No tracheal deviation present.  Pulmonary/Chest: Effort normal. No stridor.  Neurological: He is alert and oriented to person, place, and time. Coordination normal.  Psychiatric: He has a normal mood and affect. His behavior is normal. Judgment and thought content normal.  Nursing note and vitals reviewed.    ED Treatments / Results  DIAGNOSTIC STUDIES:  Oxygen Saturation is 97% on RA, normal by my interpretation.    COORDINATION OF CARE:  4:04 PM Discussed treatment plan with pt at bedside and pt agreed to plan.   Labs (all labs ordered are listed, but only abnormal results are displayed) Labs Reviewed - No data to display  EKG  EKG Interpretation None       Radiology No results found.  Procedures Procedures (including critical care time)  Medications Ordered in ED Medications - No data to display    Initial Impression / Assessment and Plan / ED Course  I have reviewed the triage vital signs and the nursing notes.  Pertinent labs & imaging results that were available during my care of the patient were reviewed by me and considered in my medical decision making (see chart for details).     Patient's presentation is consistent with dental infection.  He has no fluctuant or drainable abscess on my exam.  He is afebrile nontoxic with no signs of deep space infection.  He will be treated with antibiotics, close follow-up with dentist, strict return precautions.  He verbalized understanding and agreement to today's plan had no further  questions or concerns at home discharge  Final Clinical Impressions(s) / ED Diagnoses   Final diagnoses:  Dental infection    New Prescriptions Discharge Medication List as of 09/17/2016  4:33 PM    START taking these medications   Details  penicillin v potassium (VEETID) 500 MG tablet Take 1 tablet (500 mg total) by mouth 4 (four) times daily., Starting Thu 09/17/2016, Until Sun 09/27/2016, Print    traMADol (ULTRAM) 50 MG tablet Take 1 tablet (50 mg total) by mouth every 6 (six) hours as needed., Starting Thu 09/17/2016, Print      I personally performed the services described in this documentation, which was scribed in my presence. The recorded information has been reviewed and is accurate.  I personally performed the services described in this documentation, which was scribed in my presence. The recorded information has been reviewed and is accurate.     Okey Regal, PA-C 09/17/16 2045    Orlie Dakin, MD 09/18/16 [phone]

## 2016-09-17 NOTE — ED Triage Notes (Signed)
Patient here with complaints of mouth sore. Reports "its been going on for awhile but today worse". Pain 10/10.

## 2020-07-19 ENCOUNTER — Other Ambulatory Visit: Payer: Self-pay

## 2020-07-19 ENCOUNTER — Emergency Department (HOSPITAL_COMMUNITY): Payer: Medicare Other

## 2020-07-19 ENCOUNTER — Encounter (HOSPITAL_COMMUNITY): Payer: Self-pay | Admitting: Emergency Medicine

## 2020-07-19 ENCOUNTER — Emergency Department (HOSPITAL_COMMUNITY)
Admission: EM | Admit: 2020-07-19 | Discharge: 2020-07-19 | Disposition: A | Discharge status: Home / Self Care | Payer: Medicare Other | Attending: Emergency Medicine | Admitting: Emergency Medicine

## 2020-07-19 DIAGNOSIS — E1140 Type 2 diabetes mellitus with diabetic neuropathy, unspecified: Secondary | ICD-10-CM | POA: Diagnosis not present

## 2020-07-19 DIAGNOSIS — Y92410 Unspecified street and highway as the place of occurrence of the external cause: Secondary | ICD-10-CM | POA: Insufficient documentation

## 2020-07-19 DIAGNOSIS — S161XXA Strain of muscle, fascia and tendon at neck level, initial encounter: Secondary | ICD-10-CM | POA: Diagnosis not present

## 2020-07-19 DIAGNOSIS — M545 Low back pain, unspecified: Secondary | ICD-10-CM | POA: Diagnosis not present

## 2020-07-19 DIAGNOSIS — Z794 Long term (current) use of insulin: Secondary | ICD-10-CM | POA: Insufficient documentation

## 2020-07-19 DIAGNOSIS — S199XXA Unspecified injury of neck, initial encounter: Secondary | ICD-10-CM | POA: Diagnosis present

## 2020-07-19 DIAGNOSIS — Z79899 Other long term (current) drug therapy: Secondary | ICD-10-CM | POA: Insufficient documentation

## 2020-07-19 DIAGNOSIS — F17200 Nicotine dependence, unspecified, uncomplicated: Secondary | ICD-10-CM | POA: Insufficient documentation

## 2020-07-19 DIAGNOSIS — Z7984 Long term (current) use of oral hypoglycemic drugs: Secondary | ICD-10-CM | POA: Insufficient documentation

## 2020-07-19 DIAGNOSIS — I10 Essential (primary) hypertension: Secondary | ICD-10-CM | POA: Insufficient documentation

## 2020-07-19 DIAGNOSIS — M546 Pain in thoracic spine: Secondary | ICD-10-CM | POA: Insufficient documentation

## 2020-07-19 LAB — CBG MONITORING, ED: Glucose-Capillary: 81 mg/dL (ref 70–99)
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
## 2020-07-19 NOTE — Discharge Instructions (Addendum)
Continue your current pain medications.  The stiffness and soreness should improve over the next week
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
## 2020-07-25 ENCOUNTER — Other Ambulatory Visit: Payer: Self-pay

## 2020-07-25 ENCOUNTER — Emergency Department (HOSPITAL_COMMUNITY): Payer: Medicare Other

## 2020-07-25 ENCOUNTER — Encounter (HOSPITAL_COMMUNITY): Payer: Self-pay

## 2020-07-25 ENCOUNTER — Emergency Department (HOSPITAL_COMMUNITY)
Admission: EM | Admit: 2020-07-25 | Discharge: 2020-07-25 | Disposition: A | Discharge status: Home / Self Care | Payer: Medicare Other | Attending: Emergency Medicine | Admitting: Emergency Medicine

## 2020-07-25 DIAGNOSIS — M7918 Myalgia, other site: Secondary | ICD-10-CM

## 2020-07-25 DIAGNOSIS — F17200 Nicotine dependence, unspecified, uncomplicated: Secondary | ICD-10-CM | POA: Insufficient documentation

## 2020-07-25 DIAGNOSIS — M791 Myalgia, unspecified site: Secondary | ICD-10-CM | POA: Diagnosis not present

## 2020-07-25 DIAGNOSIS — Z79899 Other long term (current) drug therapy: Secondary | ICD-10-CM | POA: Insufficient documentation

## 2020-07-25 DIAGNOSIS — M545 Low back pain, unspecified: Secondary | ICD-10-CM | POA: Diagnosis not present

## 2020-07-25 DIAGNOSIS — Z794 Long term (current) use of insulin: Secondary | ICD-10-CM | POA: Insufficient documentation

## 2020-07-25 DIAGNOSIS — M542 Cervicalgia: Secondary | ICD-10-CM | POA: Insufficient documentation

## 2020-07-25 DIAGNOSIS — Z7984 Long term (current) use of oral hypoglycemic drugs: Secondary | ICD-10-CM | POA: Diagnosis not present

## 2020-07-25 DIAGNOSIS — M25512 Pain in left shoulder: Secondary | ICD-10-CM | POA: Insufficient documentation

## 2020-07-25 DIAGNOSIS — H538 Other visual disturbances: Secondary | ICD-10-CM | POA: Diagnosis not present

## 2020-07-25 DIAGNOSIS — I10 Essential (primary) hypertension: Secondary | ICD-10-CM | POA: Diagnosis not present

## 2020-07-25 DIAGNOSIS — E1140 Type 2 diabetes mellitus with diabetic neuropathy, unspecified: Secondary | ICD-10-CM | POA: Diagnosis not present

## 2020-07-25 LAB — CBG MONITORING, ED: Glucose-Capillary: 125 mg/dL — ABNORMAL HIGH (ref 70–99)

## 2020-07-25 MED ORDER — CYCLOBENZAPRINE HCL 10 MG PO TABS: 10.0000 mg | ORAL_TABLET | Freq: Two times a day (BID) | ORAL | 0 refills | Status: DC | PRN

## 2020-07-25 NOTE — Discharge Instructions (Signed)
Recommend following up with your primary doctor and your neurosurgeon.  Take Tylenol and Motrin for pain control.  For breakthrough pain can take the your oxycodone.  Additionally can take the muscle relaxer.  Note the pain medicine can make you drowsy and should not be taken while driving or operating heavy machinery.  If you develop worsening back pain, numbness, weakness, bladder or bowel incontinence, urinary retention or other new concerning symptom, return to ER for reassessment.
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
## 2020-12-27 ENCOUNTER — Ambulatory Visit (HOSPITAL_COMMUNITY)
Admission: EM | Admit: 2020-12-27 | Discharge: 2020-12-27 | Disposition: A | Discharge status: Home / Self Care | Payer: Medicare Other | Attending: Medical Oncology | Admitting: Medical Oncology

## 2020-12-27 ENCOUNTER — Encounter (HOSPITAL_COMMUNITY): Payer: Self-pay | Admitting: Emergency Medicine

## 2020-12-27 ENCOUNTER — Ambulatory Visit (INDEPENDENT_AMBULATORY_CARE_PROVIDER_SITE_OTHER): Payer: Medicare Other

## 2020-12-27 DIAGNOSIS — Z20822 Contact with and (suspected) exposure to covid-19: Secondary | ICD-10-CM | POA: Insufficient documentation

## 2020-12-27 DIAGNOSIS — Z87891 Personal history of nicotine dependence: Secondary | ICD-10-CM | POA: Insufficient documentation

## 2020-12-27 DIAGNOSIS — J449 Chronic obstructive pulmonary disease, unspecified: Secondary | ICD-10-CM | POA: Insufficient documentation

## 2020-12-27 DIAGNOSIS — G4730 Sleep apnea, unspecified: Secondary | ICD-10-CM | POA: Diagnosis not present

## 2020-12-27 DIAGNOSIS — R059 Cough, unspecified: Secondary | ICD-10-CM

## 2020-12-27 DIAGNOSIS — R0602 Shortness of breath: Secondary | ICD-10-CM | POA: Insufficient documentation

## 2020-12-27 LAB — SARS CORONAVIRUS 2 (TAT 6-24 HRS): SARS Coronavirus 2: NEGATIVE

## 2020-12-27 MED ORDER — PREDNISONE 10 MG PO TABS: ORAL_TABLET | ORAL | 0 refills | Status: DC

## 2020-12-27 MED ORDER — ALBUTEROL SULFATE HFA 108 (90 BASE) MCG/ACT IN AERS: 1.0000 | INHALATION_SPRAY | Freq: Four times a day (QID) | RESPIRATORY_TRACT | 0 refills | Status: DC | PRN

## 2020-12-27 NOTE — ED Triage Notes (Signed)
Pt presents with dry cough, sob, and fatigue xs 1-2 months or more.

## 2020-12-27 NOTE — ED Provider Notes (Addendum)
[facility]    CSN: [id number] Arrival date & time: 12/27/20  0809      History   Chief Complaint Chief Complaint  Patient presents with   Cough   Shortness of Breath   Fatigue    HPI [name] is a 65 y.o. male.   HPI  Cough: Patient states that he has had a dry cough and some shortness of breath and fatigue for the past 1 to 2 months. He does have sleep apnea and states that he moved recently and has not been using his machine however he wants to ensure there is no other cause. No wheezing, peripheral edema, hemoptysis, night sweats or unintentional weight loss. He has tried his inhalers for symptoms with some relief.  No known exposures to TB, he is a non-smoker but did grow up in a smoking household.   Past Medical History:  Diagnosis Date   Diabetes mellitus without complication ([place])    Hypertension    Neuropathy    Sleep apnea     There are no problems to display for this patient.   Past Surgical History:  Procedure Laterality Date   ABDOMINAL HERNIA REPAIR     BACK SURGERY     KNEE SURGERY     SHOULDER SURGERY         Home Medications    Prior to Admission medications   Medication Sig Start Date End Date Taking? Authorizing Provider  ATORVASTATIN CALCIUM PO Take 1 tablet by mouth daily.    [provider]  buPROPion (WELLBUTRIN SR) 150 MG 12 hr tablet Take 150 mg by mouth 2 (two) times daily.    [provider]  carboxymethylcellulose (REFRESH PLUS) 0.5 % SOLN 1 drop 4 (four) times daily as needed (dry eyes).    [provider]  Cholecalciferol (VITAMIN D3) 2000 UNITS TABS Take 1 tablet by mouth 2 (two) times daily after a meal.    [provider]  cyclobenzaprine (FLEXERIL) 10 MG tablet Take 1 tablet (10 mg total) by mouth 2 (two) times daily as needed for muscle spasms. 07/25/20   Lucrezia Starch, MD  GLIPIZIDE PO Take 1 tablet by mouth 2 (two) times daily.    [provider]   ibuprofen (ADVIL,MOTRIN) 600 MG tablet Take 600 mg by mouth every 6 (six) hours as needed for pain.    [provider]  insulin glargine (LANTUS) 100 UNIT/ML injection Inject 50 Units into the skin at bedtime.     [provider]  meclizine (ANTIVERT) 25 MG tablet Take 1 tablet (25 mg total) by mouth 3 (three) times daily as needed for dizziness or nausea. 05/03/13   Dorie Rank, MD  methocarbamol (ROBAXIN) 500 MG tablet Take 1 tablet (500 mg total) by mouth 4 (four) times daily. 05/17/15   Carlisle Cater, PA-C  METOPROLOL TARTRATE PO Take 1 tablet by mouth 2 (two) times daily.    [provider]  sildenafil (VIAGRA) 100 MG tablet Take 100 mg by mouth daily as needed for erectile dysfunction.    [provider]  traMADol (ULTRAM) 50 MG tablet Take 1 tablet (50 mg total) by mouth every 6 (six) hours as needed. 09/17/16   Okey Regal, PA-C    Family History History reviewed. No pertinent family history.  Social History Social History   Tobacco Use   Smoking status: Former    Pack years: 0.00    Types: Cigarettes   Smokeless tobacco: Never  Substance Use  Topics   Alcohol use: No     Allergies   Gemfibrozil, Oxycontin [oxycodone hcl], and Simvastatin   Review of Systems Review of Systems  As stated above in HPI Physical Exam Triage Vital Signs ED Triage Vitals  Enc Vitals Group     BP 12/27/20 0835 (!) 151/74     Pulse Rate 12/27/20 0835 67     Resp 12/27/20 0835 17     Temp 12/27/20 0835 98.1 F (36.7 C)     Temp Source 12/27/20 0835 Oral     SpO2 12/27/20 0835 98 %     Weight --      Height --      Head Circumference --      Peak Flow --      Pain Score 12/27/20 0831 0     Pain Loc --      Pain Edu? --      Excl. in [place]? --    No data found.  Updated Vital Signs BP (!) 151/74 (BP Location: Right Arm)   Pulse 67   Temp 98.1 F (36.7 C) (Oral)   Resp 17   SpO2 98%   Physical Exam Vitals and nursing note reviewed.   Constitutional:      General: He is not in acute distress.    Appearance: He is well-developed. He is not ill-appearing, toxic-appearing or diaphoretic.  HENT:     Head: Normocephalic and atraumatic.     Mouth/Throat:     Mouth: Mucous membranes are moist.     Pharynx: Oropharynx is clear.     Comments: FTP3 Neck:     Thyroid: No thyromegaly.  Cardiovascular:     Rate and Rhythm: Normal rate and regular rhythm.  Pulmonary:     Effort: Pulmonary effort is normal.     Breath sounds: Normal breath sounds. No decreased breath sounds, wheezing, rhonchi or rales.  Chest:     Chest wall: No mass or tenderness.  Musculoskeletal:     Cervical back: Neck supple.  Skin:    General: Skin is warm.     Coloration: Skin is not cyanotic or pale.     Findings: No erythema.  Neurological:     Mental Status: He is alert and oriented to person, place, and time.     UC Treatments / Results  Labs (all labs ordered are listed, but only abnormal results are displayed) Labs Reviewed - No data to display  EKG   Radiology No results found.  Procedures Procedures (including critical care time)  Medications Ordered in UC Medications - No data to display  Initial Impression / Assessment and Plan / UC Course  I have reviewed the triage vital signs and the nursing notes.  Pertinent labs & imaging results that were available during my care of the patient were reviewed by me and considered in my medical decision making (see chart for details).     New. Chest x ray pending. He also requests a COVID-19 test as he has been in "many different medical buildings over the past few weeks". If all are negative I would recommend a wedge pillow and unpacking his CPAP machine as well as sending in an albuterol for him to have on hand and a low dose steroid pack.   Final Clinical Impressions(s) / UC Diagnoses   Final diagnoses:  None   Discharge Instructions   None    ED Prescriptions   None     PDMP not reviewed this encounter.  Vallery Sa 12/27/20 0913    Hughie Closs, PA-C 12/27/20 [place], [place] 01/01/21 [phone]

## 2021-02-11 ENCOUNTER — Ambulatory Visit (INDEPENDENT_AMBULATORY_CARE_PROVIDER_SITE_OTHER): Payer: Medicare PPO | Admitting: Pulmonary Disease

## 2021-02-11 ENCOUNTER — Telehealth: Payer: Self-pay | Admitting: Pulmonary Disease

## 2021-02-11 ENCOUNTER — Other Ambulatory Visit: Payer: Self-pay

## 2021-02-11 ENCOUNTER — Encounter: Payer: Self-pay | Admitting: Pulmonary Disease

## 2021-02-11 VITALS — BP 140/80 | HR 65 | Temp 98.1°F | Ht 72.0 in | Wt 233.4 lb

## 2021-02-11 DIAGNOSIS — Z87891 Personal history of nicotine dependence: Secondary | ICD-10-CM

## 2021-02-11 DIAGNOSIS — J4540 Moderate persistent asthma, uncomplicated: Secondary | ICD-10-CM | POA: Diagnosis not present

## 2021-02-11 DIAGNOSIS — R0609 Other forms of dyspnea: Secondary | ICD-10-CM

## 2021-02-11 DIAGNOSIS — R0600 Dyspnea, unspecified: Secondary | ICD-10-CM | POA: Diagnosis not present

## 2021-02-11 LAB — CBC WITH DIFFERENTIAL/PLATELET
Basophils Absolute: 0.1 10*3/uL (ref 0.0–0.1)
Basophils Relative: 0.8 % (ref 0.0–3.0)
Eosinophils Absolute: 0.3 10*3/uL (ref 0.0–0.7)
Eosinophils Relative: 4.1 % (ref 0.0–5.0)
HCT: 48.4 % (ref 39.0–52.0)
Hemoglobin: 16 g/dL (ref 13.0–17.0)
Lymphocytes Relative: 29.1 % (ref 12.0–46.0)
Lymphs Abs: 2.2 10*3/uL (ref 0.7–4.0)
MCHC: 33 g/dL (ref 30.0–36.0)
MCV: 86.6 fl (ref 78.0–100.0)
Monocytes Absolute: 0.7 10*3/uL (ref 0.1–1.0)
Monocytes Relative: 8.7 % (ref 3.0–12.0)
Neutro Abs: 4.3 10*3/uL (ref 1.4–7.7)
Neutrophils Relative %: 57.3 % (ref 43.0–77.0)
Platelets: 365 10*3/uL (ref 150.0–400.0)
RBC: 5.58 Mil/uL (ref 4.22–5.81)
RDW: 14.3 % (ref 11.5–15.5)
WBC: 7.5 10*3/uL (ref 4.0–10.5)

## 2021-02-11 LAB — COMPREHENSIVE METABOLIC PANEL
ALT: 29 U/L (ref 0–53)
AST: 21 U/L (ref 0–37)
Albumin: 4.3 g/dL (ref 3.5–5.2)
Alkaline Phosphatase: 86 U/L (ref 39–117)
BUN: 13 mg/dL (ref 6–23)
CO2: 24 mEq/L (ref 19–32)
Calcium: 9.7 mg/dL (ref 8.4–10.5)
Chloride: 103 mEq/L (ref 96–112)
Creatinine, Ser: 1.02 mg/dL (ref 0.40–1.50)
GFR: 76 mL/min (ref >60.00)
Glucose, Bld: 112 mg/dL — ABNORMAL HIGH (ref 70–99)
Potassium: 4.2 mEq/L (ref 3.5–5.1)
Sodium: 137 mEq/L (ref 135–145)
Total Bilirubin: 0.3 mg/dL (ref 0.2–1.2)
Total Protein: 7.6 g/dL (ref 6.0–8.3)

## 2021-02-11 MED ORDER — FLUTICASONE FUROATE-VILANTEROL 100-25 MCG/INH IN AEPB: 1.0000 | INHALATION_SPRAY | Freq: Every day | RESPIRATORY_TRACT | 5 refills | Status: AC

## 2021-02-11 MED ORDER — ALBUTEROL SULFATE HFA 108 (90 BASE) MCG/ACT IN AERS: 2.0000 | INHALATION_SPRAY | Freq: Four times a day (QID) | RESPIRATORY_TRACT | 5 refills | Status: DC | PRN

## 2021-02-11 NOTE — Progress Notes (Signed)
[facility]  Chief Complaint  Patient presents with   Consult    Sob on exertion x 15 yrs., esp. With bending down, dry cough, wheezing    Constitutional:  BP 140/80 (BP Location: Left Arm, Cuff Size: Normal)   Pulse 65   Temp 98.1 F (36.7 C) (Temporal)   Ht 6' (1.829 m)   Wt 233 lb 6.4 oz (105.9 kg)   SpO2 97%   BMI 31.65 kg/m   Past Medical History:  OSA, DM type 2, HTN, Neuropathy, HLD, ED  Past Surgical History:  He  has a past surgical history that includes Shoulder surgery; Knee surgery; Back surgery; and Abdominal hernia repair.  Brief Summary:  [name] is a 65 y.o. male former smoker with cough and dyspnea.      Subjective:   He is here with his wife.  He has noticed problems with cough and dyspnea for years.  Also getting episodes of wheezing.  He was seen in urgent care and had chest xray from 12/27/20 which showed mild peribronchial thickening and hyperinflation.  He was prescribed prednisone taper and albuterol.  COVID 19 test was negative.  He is from [place], but has lived in [place] for the past 50 years.  He used to smoke cigarettes and then cigars.  He would smoke 12 cigars per day.  He worked as a Geophysicist/field seismologist.  He was in the [employer] in the mid 1980's.  He goes to the [place] in [place] and [place].  He has a Programmer, systems.  No history of pneumonia or tuberculosis.  He gets allergies in Spring and Fall, and notices his cough and wheeze are more troublesome then also.  He brings up clear sputum.  No trouble with food allergies or aspirin sensitivity.  No history of nasal polyps.  He gets short of breath after walking about 1 block, and uses a cane.  No history of leg swelling or thromboembolic disease.  Prednisone made him feel loopy.  Albuterol helped.  He had been on inhalers before, but didn't know he was whether he was supposed to get these refilled.  Physical Exam:   Appearance - well kempt    ENMT - no sinus tenderness, no oral exudate, no LAN, Mallampati 4 airway, no stridor, poor dentition, wears dentures  Respiratory - equal breath sounds bilaterally, no wheezing or rales  CV - s1s2 regular rate and rhythm, no murmurs  Ext - no clubbing, no edema  Skin - no rashes  Psych - normal mood and affect   Pulmonary testing:    Chest Imaging:    Sleep Tests:    Cardiac Tests:    Social History:  He  reports that he has quit smoking. His smoking use included cigarettes. He has never used smokeless tobacco. He reports that he does not drink alcohol.  Family History:  His family history includes COPD in his brother; Coronary artery disease in his father; Stroke in his mother.    Discussion:  He has symptoms suggestive of allergic asthma and rhinitis.  He has extensive prior history of tobacco smoking.  His recent chest xray shows changes of obstructive lung disease.  He has family history of COPD.  Assessment/Plan:   Dyspnea on exertion, cough, and wheezing with concern for asthma and/or COPD. - will have him try breo and prn albuterol - will arrange for pulmonary function test, CBC with differential, CMET, and IgE - if pulmonary assessment is unrevealing,  then might need additional cardiac assessment - likely has a component of deconditioning contributing to his symptoms also  History of obstructive sleep apnea. - he is [facility] for this, but has been unable to tolerate CPAP therapy  Time Spent Involved in Patient Care on Day of Examination:  47 minutes  Follow up:   Patient Instructions  Lab tests today  Will arrange for pulmonary function test  Breo one puff daily, and rinse your mouth after each use  Albuterol two puffs every 6 hours as needed for cough, wheeze, chest congestion, or shortness of breath  Follow up in 4 to 6 weeks with Dr. Halford Chessman or Nurse Practitioner  Medication List:   Allergies as of 02/11/2021        Reactions   Gemfibrozil Other (See Comments)   Doesn't know its been awile   Oxycontin [oxycodone Hcl] Other (See Comments)   Bad reaction "took me to a place I never wanna go again"   Simvastatin Other (See Comments)   Says he doesn't know its been awhile        Medication List        Accurate as of February 11, 2021 12:19 PM. If you have any questions, ask your nurse or doctor.          STOP taking these medications    predniSONE 10 MG tablet Commonly known as: DELTASONE Stopped by: Chesley Mires, MD       TAKE these medications    albuterol 108 (90 Base) MCG/ACT inhaler Commonly known as: VENTOLIN HFA Inhale 2 puffs into the lungs every 6 (six) hours as needed for wheezing or shortness of breath. What changed: how much to take Changed by: Chesley Mires, MD   ATORVASTATIN CALCIUM PO Take 1 tablet by mouth daily.   buPROPion 150 MG 12 hr tablet Commonly known as: WELLBUTRIN SR Take 150 mg by mouth 2 (two) times daily.   carboxymethylcellulose 0.5 % Soln Commonly known as: REFRESH PLUS 1 drop 4 (four) times daily as needed (dry eyes).   cyclobenzaprine 10 MG tablet Commonly known as: FLEXERIL Take 1 tablet (10 mg total) by mouth 2 (two) times daily as needed for muscle spasms.   fluticasone furoate-vilanterol 100-25 MCG/INH Aepb Commonly known as: Breo Ellipta Inhale 1 puff into the lungs daily. Started by: Chesley Mires, MD   GLIPIZIDE PO Take 1 tablet by mouth 2 (two) times daily.   ibuprofen 600 MG tablet Commonly known as: ADVIL Take 600 mg by mouth every 6 (six) hours as needed for pain.   insulin aspart protamine- aspart (70-30) 100 UNIT/ML injection Commonly known as: NOVOLOG MIX 70/30 Inject 80 Units into the skin 2 (two) times daily with a meal.   insulin glargine 100 UNIT/ML injection Commonly known as: LANTUS Inject 50 Units into the skin at bedtime.   meclizine 25 MG tablet Commonly known as: ANTIVERT Take 1 tablet (25 mg total) by  mouth 3 (three) times daily as needed for dizziness or nausea.   methocarbamol 500 MG tablet Commonly known as: ROBAXIN Take 1 tablet (500 mg total) by mouth 4 (four) times daily.   METOPROLOL TARTRATE PO Take 1 tablet by mouth 2 (two) times daily.   sildenafil 100 MG tablet Commonly known as: VIAGRA Take 100 mg by mouth daily as needed for erectile dysfunction.   traMADol 50 MG tablet Commonly known as: ULTRAM Take 1 tablet (50 mg total) by mouth every 6 (six) hours as needed.  Vitamin D3 50 MCG (2000 UT) Tabs Take 1 tablet by mouth 2 (two) times daily after a meal.        Signature:  Chesley Mires, MD [place] Pager - [phone] 02/11/2021, 12:19 PM

## 2021-02-11 NOTE — Telephone Encounter (Signed)
LMTCB

## 2021-02-11 NOTE — Patient Instructions (Signed)
Lab tests today  Will arrange for pulmonary function test  Breo one puff daily, and rinse your mouth after each use  Albuterol two puffs every 6 hours as needed for cough, wheeze, chest congestion, or shortness of breath  Follow up in 4 to 6 weeks with Dr. Halford Chessman or Nurse Practitioner

## 2021-02-12 LAB — IGE: IgE (Immunoglobulin E), Serum: 204 kU/L — ABNORMAL HIGH (ref <=114)

## 2021-02-12 NOTE — Progress Notes (Signed)
ATC patient unable to leave vm, will call back later

## 2021-02-18 NOTE — Telephone Encounter (Signed)
Lmtcb for [employer].

## 2021-02-28 NOTE — Telephone Encounter (Signed)
Called and spoke with [name] per DPR to talk about inhalers that were prescribed. She states that they have not had the chance to call his insurance company to see which ones are on the formulary. She stated they would call insurance tomorrow morning and get the list and call the office back. Advised her that once they get the list we can ask Dr. Halford Chessman which one he prefers and let them know. Will wait to hear back from patient or fiance [name].

## 2021-03-03 NOTE — Telephone Encounter (Signed)
Called spoke with [place]. She states patient already has Albuterol and will call the pharmacy to determine what is comparable to the [employer] and call the office back

## 2021-03-12 ENCOUNTER — Other Ambulatory Visit: Payer: Self-pay | Admitting: Orthopedic Surgery

## 2021-03-12 DIAGNOSIS — M5416 Radiculopathy, lumbar region: Secondary | ICD-10-CM

## 2021-03-12 DIAGNOSIS — M48062 Spinal stenosis, lumbar region with neurogenic claudication: Secondary | ICD-10-CM

## 2021-03-20 NOTE — Telephone Encounter (Signed)
Called and spoke to tech at [facility] and was advised the [employer] was in their system as generic and was $99/month. The tech ran the script as brand name and was $47/month. Called to inform pt to see if he could afford this. Lmtcb.

## 2021-03-27 NOTE — Telephone Encounter (Signed)
ATC pt x 2. Line would ring then go busy. WCB.

## 2021-03-28 ENCOUNTER — Ambulatory Visit: Payer: Medicare PPO | Admitting: Pulmonary Disease

## 2021-03-29 ENCOUNTER — Ambulatory Visit
Admission: RE | Admit: 2021-03-29 | Discharge: 2021-03-29 | Disposition: A | Discharge status: Home / Self Care | Payer: Medicare PPO | Source: Ambulatory Visit | Attending: Orthopedic Surgery | Admitting: Orthopedic Surgery

## 2021-03-29 ENCOUNTER — Other Ambulatory Visit: Payer: Self-pay

## 2021-03-29 DIAGNOSIS — M5416 Radiculopathy, lumbar region: Secondary | ICD-10-CM

## 2021-03-29 DIAGNOSIS — M48062 Spinal stenosis, lumbar region with neurogenic claudication: Secondary | ICD-10-CM

## 2021-04-03 ENCOUNTER — Encounter: Payer: Self-pay | Admitting: Emergency Medicine

## 2021-04-03 NOTE — Telephone Encounter (Signed)
Letter printed and placed in sealed envelope for outgoing mail.  Nothing further at this time.

## 2021-04-03 NOTE — Telephone Encounter (Signed)
Letter created to mail to pt. Will forward to triage to print and mail letter to pt. Can close encounter once complete.

## 2021-04-05 ENCOUNTER — Emergency Department (HOSPITAL_COMMUNITY): Payer: Medicare PPO

## 2021-04-05 ENCOUNTER — Encounter (HOSPITAL_COMMUNITY): Payer: Self-pay | Admitting: Emergency Medicine

## 2021-04-05 ENCOUNTER — Ambulatory Visit (HOSPITAL_COMMUNITY)
Admission: EM | Admit: 2021-04-05 | Discharge: 2021-04-05 | Disposition: A | Discharge status: Home / Self Care | Payer: Medicare PPO

## 2021-04-05 ENCOUNTER — Emergency Department (HOSPITAL_COMMUNITY)
Admission: EM | Admit: 2021-04-05 | Discharge: 2021-04-05 | Disposition: A | Discharge status: Home / Self Care | Payer: Medicare PPO | Attending: Emergency Medicine | Admitting: Emergency Medicine

## 2021-04-05 ENCOUNTER — Other Ambulatory Visit: Payer: Self-pay

## 2021-04-05 DIAGNOSIS — R5383 Other fatigue: Secondary | ICD-10-CM

## 2021-04-05 DIAGNOSIS — Z794 Long term (current) use of insulin: Secondary | ICD-10-CM | POA: Insufficient documentation

## 2021-04-05 DIAGNOSIS — E119 Type 2 diabetes mellitus without complications: Secondary | ICD-10-CM | POA: Insufficient documentation

## 2021-04-05 DIAGNOSIS — Z20822 Contact with and (suspected) exposure to covid-19: Secondary | ICD-10-CM | POA: Insufficient documentation

## 2021-04-05 DIAGNOSIS — Z79899 Other long term (current) drug therapy: Secondary | ICD-10-CM | POA: Diagnosis not present

## 2021-04-05 DIAGNOSIS — R42 Dizziness and giddiness: Secondary | ICD-10-CM | POA: Diagnosis not present

## 2021-04-05 DIAGNOSIS — R531 Weakness: Secondary | ICD-10-CM | POA: Diagnosis not present

## 2021-04-05 DIAGNOSIS — R110 Nausea: Secondary | ICD-10-CM | POA: Diagnosis not present

## 2021-04-05 DIAGNOSIS — I10 Essential (primary) hypertension: Secondary | ICD-10-CM | POA: Diagnosis not present

## 2021-04-05 DIAGNOSIS — Z87891 Personal history of nicotine dependence: Secondary | ICD-10-CM | POA: Insufficient documentation

## 2021-04-05 LAB — CBG MONITORING, ED: Glucose-Capillary: 216 mg/dL — ABNORMAL HIGH (ref 70–99)

## 2021-04-05 LAB — URINALYSIS, ROUTINE W REFLEX MICROSCOPIC
Bacteria, UA: NONE SEEN
Bilirubin Urine: NEGATIVE
Glucose, UA: >=500 mg/dL — AB
Hgb urine dipstick: NEGATIVE
Ketones, ur: 5 mg/dL — AB
Leukocytes,Ua: NEGATIVE
Nitrite: NEGATIVE
Protein, ur: 100 mg/dL — AB
Specific Gravity, Urine: 1.038 — ABNORMAL HIGH (ref 1.005–1.030)
pH: 6 (ref 5.0–8.0)

## 2021-04-05 LAB — RESP PANEL BY RT-PCR (FLU A&B, COVID) ARPGX2
Influenza A by PCR: NEGATIVE
Influenza B by PCR: NEGATIVE
SARS Coronavirus 2 by RT PCR: NEGATIVE

## 2021-04-05 LAB — CBC
HCT: 46.5 % (ref 39.0–52.0)
Hemoglobin: 15 g/dL (ref 13.0–17.0)
MCH: 28.2 pg (ref 26.0–34.0)
MCHC: 32.3 g/dL (ref 30.0–36.0)
MCV: 87.4 fL (ref 80.0–100.0)
Platelets: 355 10*3/uL (ref 150–400)
RBC: 5.32 MIL/uL (ref 4.22–5.81)
RDW: 13.7 % (ref 11.5–15.5)
WBC: 10 10*3/uL (ref 4.0–10.5)
nRBC: 0 % (ref 0.0–0.2)

## 2021-04-05 LAB — BASIC METABOLIC PANEL
Anion gap: 11 (ref 5–15)
BUN: 9 mg/dL (ref 8–23)
CO2: 23 mmol/L (ref 22–32)
Calcium: 9.5 mg/dL (ref 8.9–10.3)
Chloride: 102 mmol/L (ref 98–111)
Creatinine, Ser: 0.9 mg/dL (ref 0.61–1.24)
GFR, Estimated: >60 mL/min (ref >60)
Glucose, Bld: 240 mg/dL — ABNORMAL HIGH (ref 70–99)
Potassium: 4.5 mmol/L (ref 3.5–5.1)
Sodium: 136 mmol/L (ref 135–145)

## 2021-04-05 MED ORDER — ONDANSETRON 8 MG PO TBDP: 8.0000 mg | ORAL_TABLET | Freq: Three times a day (TID) | ORAL | 0 refills | Status: DC | PRN

## 2021-04-05 MED ORDER — SODIUM CHLORIDE 0.9 % IV SOLN: 1000.0000 mL | INTRAVENOUS | Status: DC

## 2021-04-05 MED ORDER — MECLIZINE HCL 25 MG PO TABS
25.0000 mg | ORAL_TABLET | Freq: Once | ORAL | Status: AC
  Administered 2021-04-05: 25 mg via ORAL
  Filled 2021-04-05: qty 1

## 2021-04-05 MED ORDER — SODIUM CHLORIDE 0.9 % IV BOLUS (SEPSIS)
1000.0000 mL | Freq: Once | INTRAVENOUS | Status: AC
  Administered 2021-04-05: 1000 mL via INTRAVENOUS

## 2021-04-05 MED ORDER — MECLIZINE HCL 25 MG PO TABS: 25.0000 mg | ORAL_TABLET | Freq: Three times a day (TID) | ORAL | 0 refills | Status: DC | PRN

## 2021-04-05 MED ORDER — ONDANSETRON HCL 4 MG/2ML IJ SOLN
4.0000 mg | Freq: Once | INTRAMUSCULAR | Status: AC
  Administered 2021-04-05: 4 mg via INTRAVENOUS
  Filled 2021-04-05: qty 2

## 2021-04-05 NOTE — ED Triage Notes (Signed)
Pt is present today with dizziness, fatigue, and vomiting. Pt states sx started this morning.

## 2021-04-05 NOTE — ED Provider Notes (Signed)
[facility] EMERGENCY DEPARTMENT Provider Note   CSN: [id number] Arrival date & time: 04/05/21  1224     History Chief Complaint  Patient presents with   Weakness   Nausea   Dizziness    [name] is a 65 y.o. male.   Weakness Associated symptoms: dizziness   Dizziness Associated symptoms: weakness    Patient presented to the ED for evaluation of dizziness and weakness.  He started having symptoms last night.  He and his fiance had been out to eat.  She had some nausea as well.  He thought that maybe he developed a virus.  He started to feel dizzy today and felt like the room was spinning somewhat when he moved around.  He has not had any fevers or chills.  No coughing.  No vomiting or diarrhea.  Past Medical History:  Diagnosis Date   Diabetes mellitus without complication ([place])    Hypertension    Neuropathy    Sleep apnea     There are no problems to display for this patient.   Past Surgical History:  Procedure Laterality Date   ABDOMINAL HERNIA REPAIR     BACK SURGERY     KNEE SURGERY     SHOULDER SURGERY         Family History  Problem Relation Age of Onset   Stroke Mother    Coronary artery disease Father    COPD Brother     Social History   Tobacco Use   Smoking status: Former    Types: Cigarettes   Smokeless tobacco: Never  Substance Use Topics   Alcohol use: No    Home Medications Prior to Admission medications   Medication Sig Start Date End Date Taking? Authorizing Provider  meclizine (ANTIVERT) 25 MG tablet Take 1 tablet (25 mg total) by mouth 3 (three) times daily as needed for dizziness. 04/05/21  Yes Dorie Rank, MD  ondansetron (ZOFRAN ODT) 8 MG disintegrating tablet Take 1 tablet (8 mg total) by mouth every 8 (eight) hours as needed for nausea or vomiting. 04/05/21  Yes Dorie Rank, MD  albuterol (VENTOLIN HFA) 108 (90 Base) MCG/ACT inhaler Inhale 2 puffs into the lungs every 6 (six) hours as needed for  wheezing or shortness of breath. 02/11/21   Chesley Mires, MD  ATORVASTATIN CALCIUM PO Take 1 tablet by mouth daily.    [provider]  buPROPion (WELLBUTRIN SR) 150 MG 12 hr tablet Take 150 mg by mouth 2 (two) times daily.    [provider]  carboxymethylcellulose (REFRESH PLUS) 0.5 % SOLN 1 drop 4 (four) times daily as needed (dry eyes).    [provider]  Cholecalciferol (VITAMIN D3) 2000 UNITS TABS Take 1 tablet by mouth 2 (two) times daily after a meal.    [provider]  cyclobenzaprine (FLEXERIL) 10 MG tablet Take 1 tablet (10 mg total) by mouth 2 (two) times daily as needed for muscle spasms. 07/25/20   Lucrezia Starch, MD  fluticasone furoate-vilanterol (BREO ELLIPTA) 100-25 MCG/INH AEPB Inhale 1 puff into the lungs daily. 02/11/21   Chesley Mires, MD  GLIPIZIDE PO Take 1 tablet by mouth 2 (two) times daily.    [provider]  ibuprofen (ADVIL,MOTRIN) 600 MG tablet Take 600 mg by mouth every 6 (six) hours as needed for pain.    [provider]  insulin aspart protamine- aspart (NOVOLOG MIX 70/30) (70-30) 100 UNIT/ML injection Inject 80 Units into the skin 2 (two) times  daily with a meal.    [provider]  insulin glargine (LANTUS) 100 UNIT/ML injection Inject 50 Units into the skin at bedtime.  Patient not taking: Reported on 02/11/2021    [provider]  meclizine (ANTIVERT) 25 MG tablet Take 1 tablet (25 mg total) by mouth 3 (three) times daily as needed for dizziness or nausea. 05/03/13   Dorie Rank, MD  methocarbamol (ROBAXIN) 500 MG tablet Take 1 tablet (500 mg total) by mouth 4 (four) times daily. 05/17/15   Carlisle Cater, PA-C  METOPROLOL TARTRATE PO Take 1 tablet by mouth 2 (two) times daily.    [provider]  sildenafil (VIAGRA) 100 MG tablet Take 100 mg by mouth daily as needed for erectile dysfunction.    [provider]  traMADol (ULTRAM) 50 MG tablet Take 1 tablet (50 mg total) by  mouth every 6 (six) hours as needed. 09/17/16   Hedges, Dellis Filbert, PA-C    Allergies    Gemfibrozil, Oxycontin [oxycodone hcl], and Simvastatin  Review of Systems   Review of Systems  Neurological:  Positive for dizziness and weakness.  All other systems reviewed and are negative.  Physical Exam Updated Vital Signs BP (!) 179/90 (BP Location: Right Arm)   Pulse 63   Temp 98.5 F (36.9 C) (Oral)   Resp 16   SpO2 94%   Physical Exam Vitals and nursing note reviewed.  Constitutional:      General: He is not in acute distress.    Appearance: He is well-developed.  HENT:     Head: Normocephalic and atraumatic.     Right Ear: External ear normal.     Left Ear: External ear normal.  Eyes:     General: No scleral icterus.       Right eye: No discharge.        Left eye: No discharge.     Conjunctiva/sclera: Conjunctivae normal.  Neck:     Trachea: No tracheal deviation.  Cardiovascular:     Rate and Rhythm: Normal rate and regular rhythm.  Pulmonary:     Effort: Pulmonary effort is normal. No respiratory distress.     Breath sounds: Normal breath sounds. No stridor. No wheezing or rales.  Abdominal:     General: Bowel sounds are normal. There is no distension.     Palpations: Abdomen is soft.     Tenderness: There is no abdominal tenderness. There is no guarding or rebound.  Musculoskeletal:        General: No tenderness.     Cervical back: Neck supple.  Skin:    General: Skin is warm and dry.     Findings: No rash.  Neurological:     Mental Status: He is alert and oriented to person, place, and time.     Cranial Nerves: No cranial nerve deficit (No facial droop, extraocular movements intact, tongue midline ).     Sensory: No sensory deficit.     Motor: No abnormal muscle tone or seizure activity.     Coordination: Coordination normal.     Comments: No pronator drift bilateral upper extrem, able to hold both legs off bed for 5 seconds, sensation intact in all extremities,  no visual field cuts, no left or right sided neglect, normal finger-nose exam bilaterally, no nystagmus noted     ED Results / Procedures / Treatments   Labs (all labs ordered are listed, but only abnormal results are displayed) Labs Reviewed  BASIC METABOLIC PANEL - Abnormal; Notable for  the following components:      Result Value   Glucose, Bld 240 (*)    All other components within normal limits  URINALYSIS, ROUTINE W REFLEX MICROSCOPIC - Abnormal; Notable for the following components:   Specific Gravity, Urine 1.038 (*)    Glucose, UA >=500 (*)    Ketones, ur 5 (*)    Protein, ur 100 (*)    All other components within normal limits  CBG MONITORING, ED - Abnormal; Notable for the following components:   Glucose-Capillary 216 (*)    All other components within normal limits  RESP PANEL BY RT-PCR (FLU A&B, COVID) ARPGX2  CBC    EKG EKG Interpretation  Date/Time:  Saturday April 05 2021 12:55:35 EDT Ventricular Rate:  64 PR Interval:  180 QRS Duration: 98 QT Interval:  420 QTC Calculation: 433 R Axis:   6 Text Interpretation: Sinus rhythm with Premature atrial complexes Nonspecific T wave abnormality Abnormal ECG No old tracing to compare Confirmed by Dorie Rank [id number]) on 04/05/2021 2:18:23 PM  Radiology DG Chest 2 View  Result Date: 04/05/2021 CLINICAL DATA:  Shortness of breath and chest pain EXAM: CHEST - 2 VIEW COMPARISON:  Chest radiograph 12/27/2020. FINDINGS: The heart size and mediastinal contours are within normal limits. Both lungs are clear. The visualized skeletal structures are unremarkable. IMPRESSION: No active cardiopulmonary disease. Electronically Signed   By: Lovey Newcomer M.D.   On: 04/05/2021 14:02    Procedures Procedures   Medications Ordered in ED Medications  sodium chloride 0.9 % bolus 1,000 mL (1,000 mLs Intravenous New Bag/Given 04/05/21 1526)    Followed by  0.9 %  sodium chloride infusion (has no administration in time range)  meclizine  (ANTIVERT) tablet 25 mg (25 mg Oral Given 04/05/21 1524)  ondansetron (ZOFRAN) injection 4 mg (4 mg Intravenous Given 04/05/21 1524)    ED Course  I have reviewed the triage vital signs and the nursing notes.  Pertinent labs & imaging results that were available during my care of the patient were reviewed by me and considered in my medical decision making (see chart for details).    MDM Rules/Calculators/A&P                           Metabolic panel reviewed, hyperglycemia noted but no signs of acidosis.  Urinalysis does not suggest urinary tract infection.  Chest x-ray without pneumonia.  CBC is pending.  Patient is nontoxic.  Suspect possible component of peripheral vertigo.  ANticipate dc if cbc is normal.   Final Clinical Impression(s) / ED Diagnoses Final diagnoses:  Dizziness  Nausea    Rx / DC Orders ED Discharge Orders          Ordered    meclizine (ANTIVERT) 25 MG tablet  3 times daily PRN        04/05/21 1554    ondansetron (ZOFRAN ODT) 8 MG disintegrating tablet  Every 8 hours PRN        04/05/21 1554             Dorie Rank, MD 04/05/21 1554

## 2021-04-05 NOTE — Discharge Instructions (Addendum)
Take the medication as prescribed.  Follow-up with your doctor next week to be rechecked if the symptoms persist

## 2021-04-05 NOTE — ED Provider Notes (Signed)
[facility]    CSN: [id number] Arrival date & time: 04/05/21  1009      History   Chief Complaint Chief Complaint  Patient presents with   Emesis   Dizziness   Fatigue    HPI [name] is a 65 y.o. male.   Patient here for evaluation of dizziness, fatigue, and nausea that has been ongoing since this morning.  Reports fianc has similar symptoms that started yesterday.  Patient does have a history of COPD and high blood pressure.  Blood pressure 194/78 in office.  Reports history of vertigo but states that this dizziness feels different. Denies any trauma, injury, or other precipitating event.  Denies any specific alleviating or aggravating factors.  Denies any fevers, chest pain, shortness of breath, numbness, tingling, abdominal pain, or headaches.    The history is provided by the patient.  Emesis Dizziness Associated symptoms: nausea and vomiting    Past Medical History:  Diagnosis Date   Diabetes mellitus without complication ([place])    Hypertension    Neuropathy    Sleep apnea     There are no problems to display for this patient.   Past Surgical History:  Procedure Laterality Date   ABDOMINAL HERNIA REPAIR     BACK SURGERY     KNEE SURGERY     SHOULDER SURGERY         Home Medications    Prior to Admission medications   Medication Sig Start Date End Date Taking? Authorizing Provider  albuterol (VENTOLIN HFA) 108 (90 Base) MCG/ACT inhaler Inhale 2 puffs into the lungs every 6 (six) hours as needed for wheezing or shortness of breath. 02/11/21   Chesley Mires, MD  ATORVASTATIN CALCIUM PO Take 1 tablet by mouth daily.    [provider]  buPROPion (WELLBUTRIN SR) 150 MG 12 hr tablet Take 150 mg by mouth 2 (two) times daily.    [provider]  carboxymethylcellulose (REFRESH PLUS) 0.5 % SOLN 1 drop 4 (four) times daily as needed (dry eyes).    [provider]  Cholecalciferol (VITAMIN D3) 2000 UNITS TABS Take 1  tablet by mouth 2 (two) times daily after a meal.    [provider]  cyclobenzaprine (FLEXERIL) 10 MG tablet Take 1 tablet (10 mg total) by mouth 2 (two) times daily as needed for muscle spasms. 07/25/20   Lucrezia Starch, MD  fluticasone furoate-vilanterol (BREO ELLIPTA) 100-25 MCG/INH AEPB Inhale 1 puff into the lungs daily. 02/11/21   Chesley Mires, MD  GLIPIZIDE PO Take 1 tablet by mouth 2 (two) times daily.    [provider]  ibuprofen (ADVIL,MOTRIN) 600 MG tablet Take 600 mg by mouth every 6 (six) hours as needed for pain.    [provider]  insulin aspart protamine- aspart (NOVOLOG MIX 70/30) (70-30) 100 UNIT/ML injection Inject 80 Units into the skin 2 (two) times daily with a meal.    [provider]  insulin glargine (LANTUS) 100 UNIT/ML injection Inject 50 Units into the skin at bedtime.  Patient not taking: Reported on 02/11/2021    [provider]  meclizine (ANTIVERT) 25 MG tablet Take 1 tablet (25 mg total) by mouth 3 (three) times daily as needed for dizziness or nausea. 05/03/13   Dorie Rank, MD  methocarbamol (ROBAXIN) 500 MG tablet Take 1 tablet (500 mg total) by mouth 4 (four) times daily. 05/17/15   Carlisle Cater, PA-C  METOPROLOL TARTRATE PO Take 1 tablet by mouth 2 (two)  times daily.    [provider]  sildenafil (VIAGRA) 100 MG tablet Take 100 mg by mouth daily as needed for erectile dysfunction.    [provider]  traMADol (ULTRAM) 50 MG tablet Take 1 tablet (50 mg total) by mouth every 6 (six) hours as needed. 09/17/16   Okey Regal, PA-C    Family History Family History  Problem Relation Age of Onset   Stroke Mother    Coronary artery disease Father    COPD Brother     Social History Social History   Tobacco Use   Smoking status: Former    Types: Cigarettes   Smokeless tobacco: Never  Substance Use Topics   Alcohol use: No     Allergies   Gemfibrozil, Oxycontin [oxycodone hcl], and  Simvastatin   Review of Systems Review of Systems  Constitutional:  Positive for fatigue.  Gastrointestinal:  Positive for nausea and vomiting.  Neurological:  Positive for dizziness.  All other systems reviewed and are negative.   Physical Exam Triage Vital Signs ED Triage Vitals  Enc Vitals Group     BP 04/05/21 1114 (!) 194/78     Pulse Rate 04/05/21 1114 66     Resp 04/05/21 1114 18     Temp 04/05/21 1115 98.2 F (36.8 C)     Temp src --      SpO2 04/05/21 1114 94 %     Weight --      Height --      Head Circumference --      Peak Flow --      Pain Score --      Pain Loc --      Pain Edu? --      Excl. in [place]? --    No data found.  Updated Vital Signs BP (!) 194/78   Pulse 66   Temp 98.2 F (36.8 C)   Resp 18   SpO2 94%   Visual Acuity Right Eye Distance:   Left Eye Distance:   Bilateral Distance:    Right Eye Near:   Left Eye Near:    Bilateral Near:     Physical Exam Vitals and nursing note reviewed.  Constitutional:      General: He is not in acute distress.    Appearance: Normal appearance. He is not ill-appearing, toxic-appearing or diaphoretic.  HENT:     Head: Normocephalic and atraumatic.     Right Ear: Tympanic membrane, ear canal and external ear normal.     Left Ear: Tympanic membrane, ear canal and external ear normal.     Nose: Nose normal.  Eyes:     Conjunctiva/sclera: Conjunctivae normal.  Cardiovascular:     Rate and Rhythm: Normal rate.     Pulses: Normal pulses.     Heart sounds: Normal heart sounds.  Pulmonary:     Effort: Pulmonary effort is normal.     Breath sounds: Normal breath sounds.  Abdominal:     General: Abdomen is flat.  Musculoskeletal:        General: Normal range of motion.     Cervical back: Normal range of motion.  Skin:    General: Skin is warm and dry.  Neurological:     General: No focal deficit present.     Mental Status: He is alert and oriented to person, place, and time.  Psychiatric:         Mood and Affect: Mood normal.     UC Treatments / Results  Labs (all labs ordered are listed, but only abnormal results are displayed) Labs Reviewed - No data to display  EKG   Radiology No results found.  Procedures Procedures (including critical care time)  Medications Ordered in UC Medications - No data to display  Initial Impression / Assessment and Plan / UC Course  I have reviewed the triage vital signs and the nursing notes.  Pertinent labs & imaging results that were available during my care of the patient were reviewed by me and considered in my medical decision making (see chart for details).    Due to elevated blood pressure, dizziness, fatigue, and nausea it is recommended that patient go to the emergency room for further evaluation.  Patient verbalized understanding.  Patient can be transported to ED via private vehicle with family member who accompanied him to the urgent care. Final Clinical Impressions(s) / UC Diagnoses   Final diagnoses:  Other fatigue  Dizziness   Discharge Instructions   None    ED Prescriptions   None    PDMP not reviewed this encounter.   Pearson Forster, NP 04/05/21 1139

## 2021-04-05 NOTE — ED Provider Notes (Signed)
Emergency Medicine Provider Triage Evaluation Note  [name] , a 65 y.o. male  was evaluated in triage.  Pt complains of generalized weakness and malaise since last night.  States he recently ran out of lancets for checking his sugars. Did not take his metformin today but did take his insulin.  He reports associated nausea and shortness of breath but denies vomiting, fever, chills.  Review of Systems  Positive:  Negative: See above   Physical Exam  BP (!) 179/90 (BP Location: Right Arm)   Pulse 63   Temp 98.5 F (36.9 C) (Oral)   Resp 16   SpO2 94%  Gen:   Awake, no distress   Resp:  Normal effort  MSK:   Moves extremities without difficulty  Other:    Medical Decision Making  Medically screening exam initiated at 1:19 PM.  Appropriate orders placed.  [name] was informed that the remainder of the evaluation will be completed by another provider, this initial triage assessment does not replace that evaluation, and the importance of remaining in the ED until their evaluation is complete.     Myna Bright [place], PA-C 04/05/21 [place], Ankit, MD 04/06/21 1348

## 2021-04-05 NOTE — ED Provider Notes (Signed)
Patient's CBC and COVID and flu all are within normal limits.  On reevaluation patient is feeling his normal self.  He would like to go home.  He was discharged with return precautions   Blanchie Dessert, MD 04/05/21 1711

## 2021-04-05 NOTE — ED Triage Notes (Signed)
Pt reports nausea, vertigo, and generalized weakness since waking up this morning.  History of vertigo.  Reports chronic back pain. Supposed to be having back surgery.  States he ate tuna last night and the other person that ate it also has nausea.

## 2021-04-05 NOTE — ED Notes (Signed)
Patient is being discharged from the Urgent Care and sent to the Emergency Department via POV . Per Caroll Rancher NP , patient is in need of higher level of care due to SOB, dizziness, and vomiting. Patient is aware and verbalizes understanding of plan of care.  Vitals:   04/05/21 1114 04/05/21 1115  BP: (!) 194/78   Pulse: 66   Resp: 18   Temp:  98.2 F (36.8 C)  SpO2: 94%

## 2021-04-14 ENCOUNTER — Ambulatory Visit: Payer: Medicare PPO | Admitting: Adult Health

## 2021-04-29 ENCOUNTER — Other Ambulatory Visit: Payer: Self-pay

## 2021-04-29 ENCOUNTER — Ambulatory Visit: Payer: Medicare PPO | Admitting: Dermatology

## 2021-04-29 DIAGNOSIS — Z1283 Encounter for screening for malignant neoplasm of skin: Secondary | ICD-10-CM | POA: Diagnosis not present

## 2021-04-29 DIAGNOSIS — T148XXA Other injury of unspecified body region, initial encounter: Secondary | ICD-10-CM

## 2021-04-29 DIAGNOSIS — L821 Other seborrheic keratosis: Secondary | ICD-10-CM | POA: Diagnosis not present

## 2021-04-29 DIAGNOSIS — S90424A Blister (nonthermal), right lesser toe(s), initial encounter: Secondary | ICD-10-CM | POA: Diagnosis not present

## 2021-04-29 DIAGNOSIS — D485 Neoplasm of uncertain behavior of skin: Secondary | ICD-10-CM

## 2021-05-01 ENCOUNTER — Telehealth: Payer: Self-pay | Admitting: Dermatology

## 2021-05-01 NOTE — Telephone Encounter (Signed)
[name] is calling to see if patient can use Neosporin on the biopsy site.

## 2021-05-01 NOTE — Telephone Encounter (Signed)
Phone call to [name] to inform her that the patient can use Vaseline or Neosporin for the biopsy site.

## 2021-05-16 ENCOUNTER — Encounter: Payer: Self-pay | Admitting: Dermatology

## 2021-05-16 NOTE — Progress Notes (Signed)
   New Patient   Subjective  [name] is a 65 y.o. male who presents for the following: Skin Problem (Patient has a mole on the left side chest. It has gotten larger over the last couple years. Did fall off per patient last year and now it has grown back larger. Also patient left big toe also has lesion on it. Its fairly new. ).  Skin examination, change in spot on the left chest concerns him and new discoloration on toenail Location:  Duration:  Quality:  Associated Signs/Symptoms: Modifying Factors:  Severity:  Timing: Context:    The following portions of the chart were reviewed this encounter and updated as appropriate:  Tobacco  Allergies  Meds  Problems  Med Hx  Surg Hx  Fam Hx      Objective  Well appearing patient in no apparent distress; mood and affect are within normal limits. Torso - Posterior (Back) Waist up skin examination.  History of change in pigmented spot on left chest will be biopsied; no other atypical spots.  Left Breast 1.2cm slightly inflamed brown crust; dermoscopy supports inflamed keratosis.       Left Hallux Hyponychium of Toe 3 mm dark subungual lesion; dermoscopy shows evidence of tiny red globules compatible with capillary bleed.    All skin waist up examined.  Plus feet   Assessment & Plan  Encounter for screening for malignant neoplasm of skin Torso - Posterior (Back)  Encouraged to self examine his skin twice annually.  Neoplasm of uncertain behavior of skin Left Breast  Skin / nail biopsy Type of biopsy: tangential   Informed consent: discussed and consent obtained   Timeout: patient name, date of birth, surgical site, and procedure verified   Procedure prep:  Patient was prepped and draped in usual sterile fashion (Non sterile) Prep type:  Chlorhexidine Anesthesia: the lesion was anesthetized in a standard fashion   Anesthetic:  1% lidocaine w/ epinephrine 1-100,000 local infiltration Instrument used:  flexible razor blade   Outcome: patient tolerated procedure well   Post-procedure details: wound care instructions given   Additional details:  Cautery only  Specimen 1 - Surgical pathology Differential Diagnosis: r/o atypia  Check Margins: No  Because of history of change lesion concerns patient; biopsy will be done.  Blood blister Left Hallux Hyponychium of Toe  Recheck in 3 months if the lesion has not either grown out more to her disappeared.

## 2021-06-08 ENCOUNTER — Emergency Department (HOSPITAL_COMMUNITY)
Admission: EM | Admit: 2021-06-08 | Discharge: 2021-06-08 | Disposition: A | Discharge status: Home / Self Care | Payer: Medicare PPO | Attending: Emergency Medicine | Admitting: Emergency Medicine

## 2021-06-08 ENCOUNTER — Other Ambulatory Visit: Payer: Self-pay

## 2021-06-08 ENCOUNTER — Encounter (HOSPITAL_COMMUNITY): Payer: Self-pay

## 2021-06-08 DIAGNOSIS — Z5321 Procedure and treatment not carried out due to patient leaving prior to being seen by health care provider: Secondary | ICD-10-CM | POA: Insufficient documentation

## 2021-06-08 DIAGNOSIS — E1165 Type 2 diabetes mellitus with hyperglycemia: Secondary | ICD-10-CM | POA: Diagnosis present

## 2021-06-08 LAB — CBG MONITORING, ED
Glucose-Capillary: 163 mg/dL — ABNORMAL HIGH (ref 70–99)
Glucose-Capillary: 182 mg/dL — ABNORMAL HIGH (ref 70–99)

## 2021-06-08 NOTE — ED Notes (Signed)
Patient wanted his CBG rechecked and if it was good enough he wanted to leave. He said he had to work late and was tired.

## 2021-06-08 NOTE — ED Triage Notes (Addendum)
Patient said he thinks his blood sugar is high, it read 486. Patient has type 2 diabetes. He takes 70/30 novolog shots 2 times a day and Ozempic 1 time a week. Patients CBG runs in the 200s.   On arrival 182 was patients CBG.

## 2021-08-15 DIAGNOSIS — E119 Type 2 diabetes mellitus without complications: Secondary | ICD-10-CM

## 2021-08-28 ENCOUNTER — Emergency Department (HOSPITAL_COMMUNITY)
Admission: EM | Admit: 2021-08-28 | Discharge: 2021-08-28 | Disposition: A | Discharge status: Home / Self Care | Payer: Medicare HMO | Attending: Emergency Medicine | Admitting: Emergency Medicine

## 2021-08-28 ENCOUNTER — Other Ambulatory Visit: Payer: Self-pay

## 2021-08-28 ENCOUNTER — Emergency Department (HOSPITAL_BASED_OUTPATIENT_CLINIC_OR_DEPARTMENT_OTHER): Payer: Medicare HMO

## 2021-08-28 ENCOUNTER — Encounter (HOSPITAL_COMMUNITY): Payer: Self-pay | Admitting: Emergency Medicine

## 2021-08-28 DIAGNOSIS — M79671 Pain in right foot: Secondary | ICD-10-CM | POA: Diagnosis present

## 2021-08-28 DIAGNOSIS — I739 Peripheral vascular disease, unspecified: Secondary | ICD-10-CM | POA: Insufficient documentation

## 2021-08-28 DIAGNOSIS — Z794 Long term (current) use of insulin: Secondary | ICD-10-CM | POA: Diagnosis not present

## 2021-08-28 DIAGNOSIS — Z7984 Long term (current) use of oral hypoglycemic drugs: Secondary | ICD-10-CM | POA: Diagnosis not present

## 2021-08-28 DIAGNOSIS — E1140 Type 2 diabetes mellitus with diabetic neuropathy, unspecified: Secondary | ICD-10-CM | POA: Diagnosis not present

## 2021-08-28 DIAGNOSIS — I70213 Atherosclerosis of native arteries of extremities with intermittent claudication, bilateral legs: Secondary | ICD-10-CM

## 2021-08-28 DIAGNOSIS — M79604 Pain in right leg: Secondary | ICD-10-CM

## 2021-08-28 DIAGNOSIS — L97512 Non-pressure chronic ulcer of other part of right foot with fat layer exposed: Secondary | ICD-10-CM | POA: Diagnosis not present

## 2021-08-28 DIAGNOSIS — L0390 Cellulitis, unspecified: Secondary | ICD-10-CM | POA: Diagnosis not present

## 2021-08-28 LAB — CBC WITH DIFFERENTIAL/PLATELET
Abs Immature Granulocytes: 0.04 10*3/uL (ref 0.00–0.07)
Basophils Absolute: 0.1 10*3/uL (ref 0.0–0.1)
Basophils Relative: 1 %
Eosinophils Absolute: 0.5 10*3/uL (ref 0.0–0.5)
Eosinophils Relative: 6 %
HCT: 45.7 % (ref 39.0–52.0)
Hemoglobin: 14.9 g/dL (ref 13.0–17.0)
Immature Granulocytes: 0 %
Lymphocytes Relative: 26 %
Lymphs Abs: 2.3 10*3/uL (ref 0.7–4.0)
MCH: 28.1 pg (ref 26.0–34.0)
MCHC: 32.6 g/dL (ref 30.0–36.0)
MCV: 86.2 fL (ref 80.0–100.0)
Monocytes Absolute: 0.8 10*3/uL (ref 0.1–1.0)
Monocytes Relative: 9 %
Neutro Abs: 5.3 10*3/uL (ref 1.7–7.7)
Neutrophils Relative %: 58 %
Platelets: 420 10*3/uL — ABNORMAL HIGH (ref 150–400)
RBC: 5.3 MIL/uL (ref 4.22–5.81)
RDW: 13.9 % (ref 11.5–15.5)
WBC: 9.1 10*3/uL (ref 4.0–10.5)
nRBC: 0 % (ref 0.0–0.2)

## 2021-08-28 LAB — COMPREHENSIVE METABOLIC PANEL
ALT: 28 U/L (ref 0–44)
AST: 32 U/L (ref 15–41)
Albumin: 3.9 g/dL (ref 3.5–5.0)
Alkaline Phosphatase: 80 U/L (ref 38–126)
Anion gap: 9 (ref 5–15)
BUN: 12 mg/dL (ref 8–23)
CO2: 25 mmol/L (ref 22–32)
Calcium: 9.2 mg/dL (ref 8.9–10.3)
Chloride: 102 mmol/L (ref 98–111)
Creatinine, Ser: 1.08 mg/dL (ref 0.61–1.24)
GFR, Estimated: >60 mL/min (ref >60)
Glucose, Bld: 90 mg/dL (ref 70–99)
Potassium: 4.5 mmol/L (ref 3.5–5.1)
Sodium: 136 mmol/L (ref 135–145)
Total Bilirubin: 1.4 mg/dL — ABNORMAL HIGH (ref 0.3–1.2)
Total Protein: 7.7 g/dL (ref 6.5–8.1)

## 2021-08-28 NOTE — Progress Notes (Signed)
ABI completed. Refer to "CV Proc" under chart review to view preliminary results.  Preliminary results discussed with Dr. Eulis Foster.  08/28/2021 10:15 AM Kelby Aline., MHA, RVT, RDCS, RDMS

## 2021-08-28 NOTE — ED Triage Notes (Signed)
Pt c/o pain in both calf's due to chronic neuropathy. Pt also want to get a sore on his right foot checked out. Hx diabetes.

## 2021-08-28 NOTE — Discharge Instructions (Addendum)
Someone from Dr. Unk Lightning office will contact you to schedule follow-up appointment.  You can call them if you have not heard from them within 48 hours.  In the meantime continue taking your usual medications.  For the sore in your foot, clean it well with soap and water daily.  Go to the drugstore and get a pad to protect the foot wound, these are shaped like a donut.  See your podiatrist as scheduled for further care of the foot wound.

## 2021-08-28 NOTE — ED Provider Notes (Signed)
[facility] DEPT Provider Note   CSN: [id number] Arrival date & time: 08/28/21  0707     History  Chief Complaint  Patient presents with   Pain in legs    Sore on Foot     [name] is a 66 y.o. male.  HPI He presents with ongoing difficulty of his lower legs described as burning and cramping, pain, which gets worse with ambulation of about 100 feet.  He also has a sore in his right foot that he is "been picking at."  He contacted his PCP who sent him here for further evaluation.  He denies fever, cough, focal weakness or paresthesia.    Home Medications Prior to Admission medications   Medication Sig Start Date End Date Taking? Authorizing Provider  acetaminophen (TYLENOL) 500 MG tablet Take by mouth.    [provider]  albuterol (VENTOLIN HFA) 108 (90 Base) MCG/ACT inhaler Inhale 2 puffs into the lungs every 6 (six) hours as needed for wheezing or shortness of breath. 02/11/21   Chesley Mires, MD  atorvastatin (LIPITOR) 80 MG tablet Take by mouth.    [provider]  ATORVASTATIN CALCIUM PO Take 1 tablet by mouth daily.    [provider]  buPROPion (WELLBUTRIN SR) 150 MG 12 hr tablet Take 150 mg by mouth 2 (two) times daily.    [provider]  buPROPion (WELLBUTRIN XL) 300 MG 24 hr tablet Take by mouth.    [provider]  carboxymethylcellulose (REFRESH PLUS) 0.5 % SOLN 1 drop 4 (four) times daily as needed (dry eyes).    [provider]  Cholecalciferol (VITAMIN D3) 2000 UNITS TABS Take 1 tablet by mouth 2 (two) times daily after a meal.    [provider]  cyclobenzaprine (FLEXERIL) 10 MG tablet Take 1 tablet (10 mg total) by mouth 2 (two) times daily as needed for muscle spasms. 07/25/20   Lucrezia Starch, MD  diclofenac (VOLTAREN) 75 MG EC tablet Take 75 mg by mouth 2 (two) times daily. 02/21/21   [provider]  donepezil (ARICEPT) 5 MG tablet Take 5 mg by mouth  daily. 02/21/21   [provider]  empagliflozin (JARDIANCE) 25 MG TABS tablet Take by mouth.    [provider]  fluticasone furoate-vilanterol (BREO ELLIPTA) 100-25 MCG/INH AEPB Inhale 1 puff into the lungs daily. 02/11/21   Chesley Mires, MD  gabapentin (NEURONTIN) 600 MG tablet Take 600 mg by mouth 3 (three) times daily. 02/21/21   [provider]  GLIPIZIDE PO Take 1 tablet by mouth 2 (two) times daily.    [provider]  ibuprofen (ADVIL,MOTRIN) 600 MG tablet Take 600 mg by mouth every 6 (six) hours as needed for pain.    [provider]  insulin aspart protamine- aspart (NOVOLOG MIX 70/30) (70-30) 100 UNIT/ML injection Inject 80 Units into the skin 2 (two) times daily with a meal.    [provider]  insulin glargine (LANTUS) 100 UNIT/ML injection Inject 50 Units into the skin at bedtime.    [provider]  lisinopril (ZESTRIL) 40 MG tablet Take by mouth.    [provider]  meclizine (ANTIVERT) 25 MG tablet Take 1 tablet (25 mg total) by mouth 3 (three) times daily as needed for dizziness or nausea. 05/03/13   Dorie Rank, MD  meclizine (ANTIVERT) 25 MG tablet Take 1 tablet (25 mg total) by mouth 3 (three) times daily as needed for dizziness. 04/05/21   Tomi Bamberger,  Wille Glaser, MD  metFORMIN (GLUMETZA) 1000 MG (MOD) 24 hr tablet Take by mouth.    [provider]  methocarbamol (ROBAXIN) 500 MG tablet Take 1 tablet (500 mg total) by mouth 4 (four) times daily. 05/17/15   Carlisle Cater, PA-C  METOPROLOL TARTRATE PO Take 1 tablet by mouth 2 (two) times daily.    [provider]  OLANZapine (ZYPREXA) 2.5 MG tablet Take 2.5 mg by mouth at bedtime. 03/06/21   [provider]  omeprazole (PRILOSEC) 20 MG capsule Take by mouth.    [provider]  ondansetron (ZOFRAN ODT) 8 MG disintegrating tablet Take 1 tablet (8 mg total) by mouth every 8 (eight) hours as needed for nausea or vomiting. 04/05/21   Dorie Rank, MD   OXcarbazepine (TRILEPTAL) 150 MG tablet Take by mouth.    [provider]  oxyCODONE (ROXICODONE) 15 MG immediate release tablet Take 15 mg by mouth 4 (four) times daily as needed. 03/21/21   [provider]  Semaglutide, 1 MG/DOSE, (OZEMPIC, 1 MG/DOSE,) 2 MG/1.5ML SOPN Inject into the skin.    [provider]  sildenafil (VIAGRA) 100 MG tablet Take 100 mg by mouth daily as needed for erectile dysfunction.    [provider]  traMADol (ULTRAM) 50 MG tablet Take 1 tablet (50 mg total) by mouth every 6 (six) hours as needed. 09/17/16   Hedges, Dellis Filbert, PA-C      Allergies    Gemfibrozil, Oxycontin [oxycodone hcl], and Simvastatin    Review of Systems   Review of Systems  Physical Exam Updated Vital Signs BP (!) 127/47    Pulse 68    Temp 98.4 F (36.9 C) (Oral)    Resp 18    Ht 6' (1.829 m)    Wt 104.3 kg    SpO2 95%    BMI 31.19 kg/m  Physical Exam Vitals and nursing note reviewed.  Constitutional:      General: He is not in acute distress.    Appearance: He is well-developed. He is obese. He is not ill-appearing or diaphoretic.  HENT:     Head: Normocephalic and atraumatic.     Right Ear: External ear normal.     Left Ear: External ear normal.  Eyes:     Conjunctiva/sclera: Conjunctivae normal.     Pupils: Pupils are equal, round, and reactive to light.  Neck:     Trachea: Phonation normal.  Cardiovascular:     Rate and Rhythm: Normal rate and regular rhythm.     Heart sounds: Normal heart sounds.  Pulmonary:     Effort: Pulmonary effort is normal.     Breath sounds: Normal breath sounds.  Abdominal:     Palpations: Abdomen is soft.     Tenderness: There is no abdominal tenderness.  Musculoskeletal:        General: No swelling, tenderness, deformity or signs of injury. Normal range of motion.     Cervical back: Normal range of motion and neck supple.  Skin:    General: Skin is warm and dry.     Comments: Open wound, plantar aspect  right foot, over fourth MTP joint.  Small amount of white material, in this wound.  Wound measures approximately 1 x 1.5 cm.  No surrounding erythema, frank discharge or fluctuance.  Neurological:     Mental Status: He is alert and oriented to person, place, and time.     Cranial Nerves: No cranial nerve deficit.     Sensory: No sensory deficit.  Motor: No abnormal muscle tone.     Coordination: Coordination normal.  Psychiatric:        Mood and Affect: Mood normal.        Behavior: Behavior normal.        Thought Content: Thought content normal.        Judgment: Judgment normal.    ED Results / Procedures / Treatments   Labs (all labs ordered are listed, but only abnormal results are displayed) Labs Reviewed  COMPREHENSIVE METABOLIC PANEL - Abnormal; Notable for the following components:      Result Value   Total Bilirubin 1.4 (*)    All other components within normal limits  CBC WITH DIFFERENTIAL/PLATELET - Abnormal; Notable for the following components:   Platelets 420 (*)    All other components within normal limits    EKG EKG Interpretation  Date/Time:  Thursday August 28 2021 08:24:11 EST Ventricular Rate:  70 PR Interval:  215 QRS Duration: 101 QT Interval:  414 QTC Calculation: 447 R Axis:   24 Text Interpretation: Sinus rhythm Atrial premature complex Borderline prolonged PR interval Nonspecific T abnormalities, lateral leads since last tracing no significant change Confirmed by Daleen Bo [phone]) on 08/28/2021 8:51:05 AM  Radiology VAS [place] ABI WITH/WO TBI  Result Date: 08/28/2021  [place] STUDY Patient Name:  Vahan Wadsworth Iracheta  Date of Exam:   08/28/2021 Medical Rec #: [id number]          Accession #:    [id number] Date of Birth: 10/21/1955           Patient Gender: M Patient Age:   28 years Exam Location:  [facility] Procedure:      VAS [place] ABI WITH/WO TBI Referring Phys: Daleen Bo  --------------------------------------------------------------------------------  Indications: Claudication, ulceration, and peripheral artery disease. High Risk         Hypertension, hyperlipidemia, Diabetes, past history of Factors:          smoking.  Comparison Study: No prior study Performing Technologist: Maudry Mayhew MHA, RVT, RDCS, RDMS  Examination Guidelines: A complete evaluation includes at minimum, Doppler waveform signals and systolic blood pressure reading at the level of bilateral brachial, anterior tibial, and posterior tibial arteries, when vessel segments are accessible. Bilateral testing is considered an integral part of a complete examination. Photoelectric Plethysmograph (PPG) waveforms and toe systolic pressure readings are included as required and additional duplex testing as needed. Limited examinations for reoccurring indications may be performed as noted.  ABI Findings: +---------+------------------+-----+-------------------+--------+  Right     Rt Pressure (mmHg) Index Waveform            Comment   +---------+------------------+-----+-------------------+--------+  Brachial  143                      triphasic                     +---------+------------------+-----+-------------------+--------+  PTA       74                 0.50  monophasic                    +---------+------------------+-----+-------------------+--------+  DP        24                 0.16  dampened monophasic           +---------+------------------+-----+-------------------+--------+  Great Toe 0  0.00  Absent                        +---------+------------------+-----+-------------------+--------+ +---------+------------------+-----+----------+-------+  Left      Lt Pressure (mmHg) Index Waveform   Comment  +---------+------------------+-----+----------+-------+  Brachial  147                      triphasic           +---------+------------------+-----+----------+-------+  PTA       41                  0.28  monophasic          +---------+------------------+-----+----------+-------+  DP        41                 0.28  monophasic          +---------+------------------+-----+----------+-------+  Great Toe 0                  0.00  Absent              +---------+------------------+-----+----------+-------+ +-------+-----------+-----------+------------+------------+  ABI/TBI Today's ABI Today's TBI Previous ABI Previous TBI  +-------+-----------+-----------+------------+------------+  Right   0.50        0.00                                   +-------+-----------+-----------+------------+------------+  Left    0.28        0.00                                   +-------+-----------+-----------+------------+------------+  Summary: Right: Resting right ankle-brachial index indicates moderate, borderline severe right lower extremity arterial disease. Left: Resting left ankle-brachial index indicates severe left lower extremity arterial disease. Absent great toe PPG waveforms bilaterally.  *See table(s) above for measurements and observations.     Preliminary     Procedures Procedures    Medications Ordered in ED Medications - No data to display  ED Course/ Medical Decision Making/ A&P Clinical Course as of 08/28/21 1123  Thu Aug 28, 2021  1050 I discussed case with on-call vascular technician who did ABIs of the legs.  Right lower leg ABI is 0.5, left lower leg ABI is 0.2.  He does not have any blood flow to the great toes bilaterally. [EW]    Clinical Course User Index [EW] Daleen Bo, MD                           Medical Decision Making Patient with insulin-dependent diabetes presenting with history of neuropathy but new pain in his lower legs, which is worse with ambulation.  He has chronic low back pain for which she is being evaluated for possible surgery.  Symptoms are subacute.  Problems Addressed: Pain in both lower extremities: acute illness or injury    Details: Worsening over the  last week.  Amount and/or Complexity of Data Reviewed Independent Historian:     Details: He is a cogent historian External Data Reviewed: labs and notes.    Details: Endocrinology consultation 08/11/2021, at that time medication changes were made to stabilize his diabetic status Labs: ordered.    Details: EPIC, metabolic panel normal except total bilirubin high, platelets are- ECG/medicine tests: ordered.  Discussion of management or test interpretation with external provider(s): Discussion/consultation with vascular surgery by telephone.  Dr. Virl Cagey states that he can see the patient in the office, for follow-up care and his office will schedule the appointment.  Risk Risk Details: Patient presenting with symptoms of claudication, no rest pain.  Has incidental finding for right foot ulcer, not acutely infected.  He has diabetes.  He has chronic symptoms of neuropathy of the legs.  He states that he has a scheduled follow-up with podiatry, soon.  Doubt DVT, acute arterial clot, metabolic disorder or infection in the legs.  He does have a component of neuropathy likely contributing to some of his lower extremity symptoms.  His wound of the foot is not currently infected, he is instructed on unloading by using a doughnut over the sore.  Anticipate follow-up with vascular surgery as an outpatient soon.           Final Clinical Impression(s) / ED Diagnoses Final diagnoses:  Pain in both lower extremities  Ulcer of right foot with fat layer exposed ([place])  Claudication ([place])  Peripheral vascular disease ([place])    Rx / DC Orders ED Discharge Orders     None         Daleen Bo, MD 08/28/21 1123

## 2021-08-28 NOTE — ED Notes (Signed)
An After Visit Summary was printed and given to the patient. Discharge instructions given and no further questions at this time.  Pt ambulatory, A&Ox4.  

## 2021-09-05 ENCOUNTER — Encounter: Payer: Self-pay | Admitting: Vascular Surgery

## 2021-09-05 ENCOUNTER — Other Ambulatory Visit: Payer: Self-pay

## 2021-09-05 ENCOUNTER — Ambulatory Visit: Payer: Medicare HMO | Admitting: Vascular Surgery

## 2021-09-05 VITALS — BP 138/74 | HR 74 | Temp 98.3°F | Resp 20 | Ht 72.0 in | Wt 234.0 lb

## 2021-09-05 DIAGNOSIS — I70223 Atherosclerosis of native arteries of extremities with rest pain, bilateral legs: Secondary | ICD-10-CM

## 2021-09-05 NOTE — Progress Notes (Signed)
Office Note     CC: Bilateral lower extremity rest pain, right greater than left Requesting Provider:  Harvie Junior, MD  HPI: Luciano Cinquemani Hollenkamp is a 66 y.o. (11-Aug-1955) male presenting at the request of .Harvie Junior, MD for bilateral lower extremity rest pain, right greater than left.  Medical history includes longstanding diabetes, hypertension, OSA, neuropathy.  Originally from [place] he came to [place] several years ago. An Scientist, research (life sciences), and truck driver by trade, he is now retired.   Astin has appreciated bilateral lower extremity pain for several years, but stated over the last several months it is worsened significantly.  The pain will wake him up at night, and is relieved by rubbing his calves and placing his feet in the dependent position.  The pain extends from his thighs down through his calves.  He initially denied issues with wound healing, but noted removing a callus roughly 1 month ago on the plantar surface of his right foot.  This area has failed to heal.  0 also has a history of back pain, and sciatica.  The pt is  on a statin for cholesterol management.  The pt is not on a daily aspirin.   Other AC:  - The pt is  on medication for hypertension.   The pt is  diabetic.  Tobacco hx:  former  Past Medical History:  Diagnosis Date   Diabetes mellitus without complication ([place])    Hypertension    Neuropathy    Sleep apnea     Past Surgical History:  Procedure Laterality Date   ABDOMINAL HERNIA REPAIR     BACK SURGERY     KNEE SURGERY     SHOULDER SURGERY      Social History   Socioeconomic History   Marital status: Married    Spouse name: Not on file   Number of children: Not on file   Years of education: Not on file   Highest education level: Not on file  Occupational History   Not on file  Tobacco Use   Smoking status: Former    Types: Cigarettes   Smokeless tobacco: Never  Substance and Sexual Activity   Alcohol use: No    Drug use: Never   Sexual activity: Not on file  Other Topics Concern   Not on file  Social History Narrative   Not on file   Social Determinants of Health   Financial Resource Strain: Not on file  Food Insecurity: Not on file  Transportation Needs: Not on file  Physical Activity: Not on file  Stress: Not on file  Social Connections: Not on file  Intimate Partner Violence: Not on file   Family History  Problem Relation Age of Onset   Stroke Mother    Coronary artery disease Father    COPD Brother     Current Outpatient Medications  Medication Sig Dispense Refill   acetaminophen (TYLENOL) 500 MG tablet Take by mouth.     albuterol (VENTOLIN HFA) 108 (90 Base) MCG/ACT inhaler Inhale 2 puffs into the lungs every 6 (six) hours as needed for wheezing or shortness of breath. 1 each 5   atorvastatin (LIPITOR) 80 MG tablet Take by mouth.     buPROPion (WELLBUTRIN SR) 150 MG 12 hr tablet Take 150 mg by mouth 2 (two) times daily.     buPROPion (WELLBUTRIN XL) 300 MG 24 hr tablet Take by mouth.     carboxymethylcellulose (REFRESH PLUS) 0.5 % SOLN 1 drop 4 (four)  times daily as needed (dry eyes).     Cholecalciferol (VITAMIN D3) 2000 UNITS TABS Take 1 tablet by mouth 2 (two) times daily after a meal.     cyclobenzaprine (FLEXERIL) 10 MG tablet Take 1 tablet (10 mg total) by mouth 2 (two) times daily as needed for muscle spasms. 20 tablet 0   diclofenac (VOLTAREN) 75 MG EC tablet Take 75 mg by mouth 2 (two) times daily.     donepezil (ARICEPT) 5 MG tablet Take 5 mg by mouth daily.     empagliflozin (JARDIANCE) 25 MG TABS tablet Take by mouth.     fluticasone furoate-vilanterol (BREO ELLIPTA) 100-25 MCG/INH AEPB Inhale 1 puff into the lungs daily. 60 each 5   gabapentin (NEURONTIN) 600 MG tablet Take 600 mg by mouth 3 (three) times daily.     GLIPIZIDE PO Take 1 tablet by mouth 2 (two) times daily.     ibuprofen (ADVIL,MOTRIN) 600 MG tablet Take 600 mg by mouth every 6 (six) hours as needed  for pain.     insulin aspart protamine- aspart (NOVOLOG MIX 70/30) (70-30) 100 UNIT/ML injection Inject 80 Units into the skin 2 (two) times daily with a meal.     insulin glargine (LANTUS) 100 UNIT/ML injection Inject 50 Units into the skin at bedtime.     lisinopril (ZESTRIL) 40 MG tablet Take by mouth.     meclizine (ANTIVERT) 25 MG tablet Take 1 tablet (25 mg total) by mouth 3 (three) times daily as needed for dizziness. 30 tablet 0   metFORMIN (GLUMETZA) 1000 MG (MOD) 24 hr tablet Take by mouth.     methocarbamol (ROBAXIN) 500 MG tablet Take 1 tablet (500 mg total) by mouth 4 (four) times daily. 20 tablet 0   METOPROLOL TARTRATE PO Take 1 tablet by mouth 2 (two) times daily.     OLANZapine (ZYPREXA) 2.5 MG tablet Take 2.5 mg by mouth at bedtime.     omeprazole (PRILOSEC) 20 MG capsule Take by mouth.     ondansetron (ZOFRAN ODT) 8 MG disintegrating tablet Take 1 tablet (8 mg total) by mouth every 8 (eight) hours as needed for nausea or vomiting. 12 tablet 0   OXcarbazepine (TRILEPTAL) 150 MG tablet Take by mouth.     oxyCODONE (ROXICODONE) 15 MG immediate release tablet Take 15 mg by mouth 4 (four) times daily as needed.     Semaglutide, 1 MG/DOSE, (OZEMPIC, 1 MG/DOSE,) 2 MG/1.5ML SOPN Inject into the skin.     sildenafil (VIAGRA) 100 MG tablet Take 100 mg by mouth daily as needed for erectile dysfunction.     traMADol (ULTRAM) 50 MG tablet Take 1 tablet (50 mg total) by mouth every 6 (six) hours as needed. 15 tablet 0   amLODipine (NORVASC) 5 MG tablet Take 5 mg by mouth daily.     olmesartan-hydrochlorothiazide (BENICAR HCT) 40-12.5 MG tablet Take 1 tablet by mouth daily.     No current facility-administered medications for this visit.    Allergies  Allergen Reactions   Gemfibrozil Other (See Comments)    Doesn't know its been awile   Oxycontin [Oxycodone Hcl] Other (See Comments)    Bad reaction "took me to a place I never wanna go again"   Simvastatin Other (See Comments)    Says  he doesn't know its been awhile     REVIEW OF SYSTEMS:   [X]  denotes positive finding, [ ]  denotes negative finding Cardiac  Comments:  Chest pain or chest pressure:  Shortness of breath upon exertion:    Short of breath when lying flat:    Irregular heart rhythm:        Vascular    Pain in calf, thigh, or hip brought on by ambulation:    Pain in feet at night that wakes you up from your sleep:     Blood clot in your veins:    Leg swelling:         Pulmonary    Oxygen at home:    Productive cough:     Wheezing:         Neurologic    Sudden weakness in arms or legs:     Sudden numbness in arms or legs:     Sudden onset of difficulty speaking or slurred speech:    Temporary loss of vision in one eye:     Problems with dizziness:         Gastrointestinal    Blood in stool:     Vomited blood:         Genitourinary    Burning when urinating:     Blood in urine:        Psychiatric    Major depression:         Hematologic    Bleeding problems:    Problems with blood clotting too easily:        Skin    Rashes or ulcers:        Constitutional    Fever or chills:      PHYSICAL EXAMINATION:  Vitals:   09/05/21 1121  BP: 138/74  Pulse: 74  Resp: 20  Temp: 98.3 F (36.8 C)  SpO2: 97%  Weight: 234 lb (106.1 kg)  Height: 6' (1.829 m)    General:  WDWN in NAD; vital signs documented above Gait: Not observed HENT: WNL, normocephalic Pulmonary: normal non-labored breathing , without wheezing Cardiac: regular HR, Abdomen: soft, NT, no masses Skin: without rashes Vascular Exam/Pulses:  Right Left  Radial 2+ (normal) 2+ (normal)  Ulnar 2+ (normal) 2+ (normal)  Femoral 2+ (normal) 2+ (normal)  Popliteal    DP absent absent  PT absent absent   Extremities: without ischemic changes, without Gangrene , without cellulitis; with open wounds; roughly 1 cm x 1 cm cervical on the plantar aspect of the right foot.  Site appears clean. Musculoskeletal: no muscle  wasting or atrophy  Neurologic: A&O X 3;  No focal weakness or paresthesias are detected Psychiatric:  The pt has Normal affect.   Non-Invasive Vascular Imaging:   +-------+-----------+-----------+------------+------------+   ABI/TBI Today's ABI Today's TBI Previous ABI Previous TBI   +-------+-----------+-----------+------------+------------+   Right   0.50        0.00                                    +-------+-----------+-----------+------------+------------+   Left    0.28        0.00                                    +-------+-----------+-----------+------------+------------+     ASSESSMENT/PLAN: Harim Bi Gotwalt is a 66 y.o. male presenting with right lower extremity tissue loss, left lower extremity rest pain.  ABIs demonstrate monophasic waveforms at the ankle bilaterally with depressed toe pressures.  On exam, he had nonpalpable pulses  in the feet.  Josiel's peripheral arterial disease is defined as Rutherford 5 critical limb ischemia on the right, Rutherford 4 critical limb ischemia on the left.  I had a long conversation with him regarding the above.  He is very concerned about amputation, having a brother who required bilateral above-knee amputations prior to passing away.  Jorian would benefit from bilateral lower extremity angiogram with emphasis on the right in an effort to define and improve distal perfusion for wound healing.  I have looked at the schedule and can fit him in this Wednesday.  I will also have an urgent referral sent to my podiatry colleagues for wound management.  After discussing the risk and benefits of the above, Annie elected to proceed  I asked that he continue his current medication regimen.  [name], MD [facility] [phone]

## 2021-09-08 ENCOUNTER — Telehealth: Payer: Self-pay | Admitting: Vascular Surgery

## 2021-09-08 NOTE — Telephone Encounter (Signed)
Made patient appt with [facility] for Tuesday, 7th. Left patient vm ?

## 2021-09-09 ENCOUNTER — Ambulatory Visit: Payer: Medicare HMO | Admitting: Podiatry

## 2021-09-09 ENCOUNTER — Telehealth: Payer: Self-pay

## 2021-09-09 NOTE — Telephone Encounter (Signed)
Received voice message from patient requesting a call regarding authorization for procedure on tomorrow. Attempted to reach patient at number on chart/given. No answer and voicemail was full.  ?

## 2021-09-10 ENCOUNTER — Other Ambulatory Visit: Payer: Self-pay

## 2021-09-10 ENCOUNTER — Encounter (HOSPITAL_COMMUNITY)
Admission: RE | Disposition: A | Discharge status: Home / Self Care | Payer: Self-pay | Source: Home / Self Care | Attending: Vascular Surgery

## 2021-09-10 ENCOUNTER — Ambulatory Visit (HOSPITAL_COMMUNITY)
Admission: RE | Admit: 2021-09-10 | Discharge: 2021-09-10 | Disposition: A | Discharge status: Home / Self Care | Payer: Medicare HMO | Source: Home / Self Care | Attending: Vascular Surgery | Admitting: Vascular Surgery

## 2021-09-10 DIAGNOSIS — Z87891 Personal history of nicotine dependence: Secondary | ICD-10-CM | POA: Insufficient documentation

## 2021-09-10 DIAGNOSIS — A419 Sepsis, unspecified organism: Secondary | ICD-10-CM | POA: Diagnosis not present

## 2021-09-10 DIAGNOSIS — G4733 Obstructive sleep apnea (adult) (pediatric): Secondary | ICD-10-CM | POA: Insufficient documentation

## 2021-09-10 DIAGNOSIS — E1140 Type 2 diabetes mellitus with diabetic neuropathy, unspecified: Secondary | ICD-10-CM | POA: Insufficient documentation

## 2021-09-10 DIAGNOSIS — I70221 Atherosclerosis of native arteries of extremities with rest pain, right leg: Secondary | ICD-10-CM | POA: Insufficient documentation

## 2021-09-10 DIAGNOSIS — E1151 Type 2 diabetes mellitus with diabetic peripheral angiopathy without gangrene: Secondary | ICD-10-CM | POA: Insufficient documentation

## 2021-09-10 DIAGNOSIS — I70222 Atherosclerosis of native arteries of extremities with rest pain, left leg: Secondary | ICD-10-CM | POA: Diagnosis not present

## 2021-09-10 DIAGNOSIS — I10 Essential (primary) hypertension: Secondary | ICD-10-CM | POA: Insufficient documentation

## 2021-09-10 HISTORY — PX: PERIPHERAL VASCULAR BALLOON ANGIOPLASTY: CATH118281

## 2021-09-10 HISTORY — PX: ABDOMINAL AORTOGRAM W/LOWER EXTREMITY: CATH118223

## 2021-09-10 LAB — POCT I-STAT, CHEM 8
BUN: 23 mg/dL (ref 8–23)
Calcium, Ion: 1.2 mmol/L (ref 1.15–1.40)
Chloride: 101 mmol/L (ref 98–111)
Creatinine, Ser: 1.2 mg/dL (ref 0.61–1.24)
Glucose, Bld: 104 mg/dL — ABNORMAL HIGH (ref 70–99)
HCT: 43 % (ref 39.0–52.0)
Hemoglobin: 14.6 g/dL (ref 13.0–17.0)
Potassium: 3.5 mmol/L (ref 3.5–5.1)
Sodium: 137 mmol/L (ref 135–145)
TCO2: 27 mmol/L (ref 22–32)

## 2021-09-10 LAB — GLUCOSE, CAPILLARY
Glucose-Capillary: 103 mg/dL — ABNORMAL HIGH (ref 70–99)
Glucose-Capillary: 79 mg/dL (ref 70–99)

## 2021-09-10 SURGERY — ABDOMINAL AORTOGRAM W/LOWER EXTREMITY
Anesthesia: LOCAL | Laterality: Right

## 2021-09-10 MED ORDER — SODIUM CHLORIDE 0.9% FLUSH: 3.0000 mL | Freq: Two times a day (BID) | INTRAVENOUS | Status: DC

## 2021-09-10 MED ORDER — HEPARIN (PORCINE) IN NACL 1000-0.9 UT/500ML-% IV SOLN
INTRAVENOUS | Status: AC
  Filled 2021-09-10: qty 1000

## 2021-09-10 MED ORDER — LIDOCAINE HCL (PF) 1 % IJ SOLN
INTRAMUSCULAR | Status: DC | PRN
  Administered 2021-09-10: 18 mL

## 2021-09-10 MED ORDER — HEPARIN (PORCINE) IN NACL 1000-0.9 UT/500ML-% IV SOLN
INTRAVENOUS | Status: DC | PRN
  Administered 2021-09-10 (×2): 500 mL

## 2021-09-10 MED ORDER — CLOPIDOGREL BISULFATE 75 MG PO TABS
75.0000 mg | ORAL_TABLET | Freq: Every day | ORAL | 0 refills | Status: DC
Start: 2021-09-10 — End: 2021-10-21

## 2021-09-10 MED ORDER — LABETALOL HCL 5 MG/ML IV SOLN: 10.0000 mg | INTRAVENOUS | Status: DC | PRN

## 2021-09-10 MED ORDER — IODIXANOL 320 MG/ML IV SOLN
INTRAVENOUS | Status: DC | PRN
Start: 2021-09-10 — End: 2021-09-10
  Administered 2021-09-10: 130 mL

## 2021-09-10 MED ORDER — ASPIRIN 81 MG PO CHEW
CHEWABLE_TABLET | ORAL | Status: DC | PRN
  Administered 2021-09-10: 81 mg via ORAL

## 2021-09-10 MED ORDER — FENTANYL CITRATE (PF) 100 MCG/2ML IJ SOLN
INTRAMUSCULAR | Status: DC | PRN
  Administered 2021-09-10 (×2): 50 ug via INTRAVENOUS

## 2021-09-10 MED ORDER — CLOPIDOGREL BISULFATE 75 MG PO TABS: 75.0000 mg | ORAL_TABLET | Freq: Every day | ORAL | Status: DC

## 2021-09-10 MED ORDER — ACETAMINOPHEN 325 MG PO TABS: 650.0000 mg | ORAL_TABLET | ORAL | Status: DC | PRN

## 2021-09-10 MED ORDER — SODIUM CHLORIDE 0.9 % IV SOLN: INTRAVENOUS | Status: DC

## 2021-09-10 MED ORDER — MIDAZOLAM HCL 2 MG/2ML IJ SOLN
INTRAMUSCULAR | Status: DC | PRN
  Administered 2021-09-10: 1 mg via INTRAVENOUS

## 2021-09-10 MED ORDER — MIDAZOLAM HCL 2 MG/2ML IJ SOLN
INTRAMUSCULAR | Status: AC
  Filled 2021-09-10: qty 2

## 2021-09-10 MED ORDER — SODIUM CHLORIDE 0.9% FLUSH: 3.0000 mL | INTRAVENOUS | Status: DC | PRN

## 2021-09-10 MED ORDER — SODIUM CHLORIDE 0.9 % IV SOLN: 250.0000 mL | INTRAVENOUS | Status: DC | PRN

## 2021-09-10 MED ORDER — LIDOCAINE HCL (PF) 1 % IJ SOLN
INTRAMUSCULAR | Status: AC
  Filled 2021-09-10: qty 30

## 2021-09-10 MED ORDER — ASPIRIN EC 81 MG PO TBEC: 81.0000 mg | DELAYED_RELEASE_TABLET | Freq: Every day | ORAL | 2 refills | Status: AC

## 2021-09-10 MED ORDER — FENTANYL CITRATE (PF) 100 MCG/2ML IJ SOLN
INTRAMUSCULAR | Status: AC
  Filled 2021-09-10: qty 2

## 2021-09-10 MED ORDER — CLOPIDOGREL BISULFATE 300 MG PO TABS
ORAL_TABLET | ORAL | Status: DC | PRN
  Administered 2021-09-10: 300 mg via ORAL

## 2021-09-10 MED ORDER — CLOPIDOGREL BISULFATE 300 MG PO TABS
ORAL_TABLET | ORAL | Status: AC
  Filled 2021-09-10: qty 1

## 2021-09-10 MED ORDER — ASPIRIN 81 MG PO CHEW
CHEWABLE_TABLET | ORAL | Status: AC
  Filled 2021-09-10: qty 1

## 2021-09-10 MED ORDER — ONDANSETRON HCL 4 MG/2ML IJ SOLN
4.0000 mg | Freq: Four times a day (QID) | INTRAMUSCULAR | Status: DC | PRN
Start: 2021-09-10 — End: 2021-09-10

## 2021-09-10 MED ORDER — SODIUM CHLORIDE 0.9 % WEIGHT BASED INFUSION: 1.0000 mL/kg/h | INTRAVENOUS | Status: DC

## 2021-09-10 MED ORDER — HYDRALAZINE HCL 20 MG/ML IJ SOLN: 5.0000 mg | INTRAMUSCULAR | Status: DC | PRN

## 2021-09-10 MED ORDER — HEPARIN SODIUM (PORCINE) 1000 UNIT/ML IJ SOLN
INTRAMUSCULAR | Status: DC | PRN
  Administered 2021-09-10: 10000 [IU] via INTRAVENOUS

## 2021-09-10 MED ORDER — ASPIRIN EC 81 MG PO TBEC: 81.0000 mg | DELAYED_RELEASE_TABLET | Freq: Every day | ORAL | Status: DC

## 2021-09-10 SURGICAL SUPPLY — 17 items
BALLN IN.PACT DCB 5X40 (BALLOONS) ×3
CATH OMNI FLUSH 5F 65CM (CATHETERS) ×1 IMPLANT
CATH QUICKCROSS .035X135CM (MICROCATHETER) ×1 IMPLANT
DCB IN.PACT 5X40 (BALLOONS) IMPLANT
DEVICE CLOSURE MYNXGRIP 6/7F (Vascular Products) ×1 IMPLANT
GLIDEWIRE ADV .035X260CM (WIRE) ×1 IMPLANT
KIT ENCORE 26 ADVANTAGE (KITS) ×1 IMPLANT
KIT MICROPUNCTURE NIT STIFF (SHEATH) ×1 IMPLANT
KIT PV (KITS) ×3 IMPLANT
SHEATH HIGHFLEX ANSEL 6FRX55 (SHEATH) ×1 IMPLANT
SHEATH PINNACLE 5F 10CM (SHEATH) ×1 IMPLANT
SHEATH PINNACLE 6F 10CM (SHEATH) ×1 IMPLANT
SYR MEDRAD MARK V 150ML (SYRINGE) ×1 IMPLANT
TRANSDUCER W/STOPCOCK (MISCELLANEOUS) ×3 IMPLANT
TRAY PV CATH (CUSTOM PROCEDURE TRAY) ×3 IMPLANT
WIRE BENTSON .035X145CM (WIRE) ×1 IMPLANT
WIRE ROSEN-J .035X180CM (WIRE) ×1 IMPLANT

## 2021-09-10 NOTE — H&P (Signed)
?Office Note  ? ?Patient seen and examined in preop holding.  No complaints. ?No changes to medication history or physical exam since last seen in clinic. ?After discussing the risks and benefits of bilateral lower extremity angiogram with emphasis on the right for CLI5, [name] elected to proceed.  ? ?[name] John MD ? ? ?CC: Bilateral lower extremity rest pain, right greater than left ?Requesting Provider:  No ref. provider found ? ?HPI: [name] is a 66 y.o. ([birth year]) male presenting at the request of .Harvie Junior, MD for bilateral lower extremity rest pain, right greater than left.  Medical history includes longstanding diabetes, hypertension, OSA, neuropathy. ? ?Originally from [place] he came to [place] several years ago. An Scientist, research (life sciences), and truck driver by trade, he is now retired.   [name] has appreciated bilateral lower extremity pain for several years, but stated over the last several months it is worsened significantly.  The pain will wake him up at night, and is relieved by rubbing his calves and placing his feet in the dependent position.  The pain extends from his thighs down through his calves.  He initially denied issues with wound healing, but noted removing a callus roughly 1 month ago on the plantar surface of his right foot.  This area has failed to heal.  [name] also has a history of back pain, and sciatica. ? ?The pt is  on a statin for cholesterol management.  ?The pt is not on a daily aspirin.   Other AC:  - ?The pt is  on medication for hypertension.   ?The pt is  diabetic.  ?Tobacco hx:  former ? ?Past Medical History:  ?Diagnosis Date  ? Diabetes mellitus without complication ([place])   ? Hypertension   ? Neuropathy   ? Sleep apnea   ? ? ?Past Surgical History:  ?Procedure Laterality Date  ? ABDOMINAL HERNIA REPAIR    ? BACK SURGERY    ? KNEE SURGERY    ? SHOULDER SURGERY    ? ? ?Social History  ? ?Socioeconomic History  ? Marital status: Married  ?   Spouse name: Not on file  ? Number of children: Not on file  ? Years of education: Not on file  ? Highest education level: Not on file  ?Occupational History  ? Not on file  ?Tobacco Use  ? Smoking status: Former  ?  Types: Cigarettes  ? Smokeless tobacco: Never  ?Substance and Sexual Activity  ? Alcohol use: No  ? Drug use: Never  ? Sexual activity: Not on file  ?Other Topics Concern  ? Not on file  ?Social History Narrative  ? Not on file  ? ?Social Determinants of Health  ? ?Financial Resource Strain: Not on file  ?Food Insecurity: Not on file  ?Transportation Needs: Not on file  ?Physical Activity: Not on file  ?Stress: Not on file  ?Social Connections: Not on file  ?Intimate Partner Violence: Not on file  ? ?Family History  ?Problem Relation Age of Onset  ? Stroke Mother   ? Coronary artery disease Father   ? COPD Brother   ? ? ?Current Facility-Administered Medications  ?Medication Dose Route Frequency Provider Last Rate Last Admin  ? [name].9 %  sodium chloride infusion   Intravenous Continuous [name] John, MD      ? ? ?Allergies  ?Allergen Reactions  ? Gemfibrozil Other (See Comments)  ?  Doesn't know its been awhile  ? Oxycontin [Oxycodone  Hcl] Other (See Comments)  ?  Bad reaction "took me to a place I never wanna go again"  ? Simvastatin Other (See Comments)  ?  Says he doesn't know its been awhile  ? ? ? ?REVIEW OF SYSTEMS:  ? ?'[X]'$  denotes positive finding, '[ ]'$  denotes negative finding ?Cardiac  Comments:  ?Chest pain or chest pressure:    ?Shortness of breath upon exertion:    ?Short of breath when lying flat:    ?Irregular heart rhythm:    ?    ?Vascular    ?Pain in calf, thigh, or hip brought on by ambulation:    ?Pain in feet at night that wakes you up from your sleep:     ?Blood clot in your veins:    ?Leg swelling:     ?    ?Pulmonary    ?Oxygen at home:    ?Productive cough:     ?Wheezing:     ?    ?Neurologic    ?Sudden weakness in arms or legs:     ?Sudden numbness in arms or legs:     ?Sudden  onset of difficulty speaking or slurred speech:    ?Temporary loss of vision in one eye:     ?Problems with dizziness:     ?    ?Gastrointestinal    ?Blood in stool:     ?Vomited blood:     ?    ?Genitourinary    ?Burning when urinating:     ?Blood in urine:    ?    ?Psychiatric    ?Major depression:     ?    ?Hematologic    ?Bleeding problems:    ?Problems with blood clotting too easily:    ?    ?Skin    ?Rashes or ulcers:    ?    ?Constitutional    ?Fever or chills:    ? ? ?PHYSICAL EXAMINATION: ? ?Vitals:  ? 09/10/21 0736  ?BP: (!) 142/59  ?Pulse: 74  ?Resp: 19  ?Temp: 98.6 ?F (37 ?C)  ?TempSrc: Oral  ?SpO2: 98%  ?Weight: 106.1 kg  ?Height: 6' (1.829 m)  ? ? ?General:  WDWN in NAD; vital signs documented above ?Gait: Not observed ?HENT: WNL, normocephalic ?Pulmonary: normal non-labored breathing , without wheezing ?Cardiac: regular HR, ?Abdomen: soft, NT, no masses ?Skin: without rashes ?Vascular Exam/Pulses: ? Right Left  ?Radial 2+ (normal) 2+ (normal)  ?Ulnar 2+ (normal) 2+ (normal)  ?Femoral 2+ (normal) 2+ (normal)  ?Popliteal    ?DP absent absent  ?PT absent absent  ? ?Extremities: without ischemic changes, without Gangrene , without cellulitis; with open wounds; roughly 1 cm x 1 cm cervical on the plantar aspect of the right foot.  Site appears clean. ?Musculoskeletal: no muscle wasting or atrophy  ?Neurologic: A&O X 3;  No focal weakness or paresthesias are detected ?Psychiatric:  The pt has Normal affect. ? ? ?Non-Invasive Vascular Imaging:   ?+-------+-----------+-----------+------------+------------+  ?ABI/TBIToday's ABIToday's TBIPrevious ABIPrevious TBI  ?+-------+-----------+-----------+------------+------------+  ?Right  [name].50       [name].00                                 ?+-------+-----------+-----------+------------+------------+  ?Left   [name].28       [name].00                                 ?+-------+-----------+-----------+------------+------------+  ? ? ? ?  ASSESSMENT/PLAN: [name] is a 66 y.o. male presenting with right lower extremity tissue loss, left lower extremity rest pain.  ABIs demonstrate monophasic waveforms at the ankle bilaterally with depressed toe pressures.  On exam, he had nonpalpable pulses in the feet. ? ?[name]'s peripheral arterial disease is defined as Rutherford 5 critical limb ischemia on the right, Rutherford 4 critical limb ischemia on the left. ? ?I had a long conversation with him regarding the above.  He is very concerned about amputation, having a brother who required bilateral above-knee amputations prior to passing away. ? ?[name] would benefit from bilateral lower extremity angiogram with emphasis on the right in an effort to define and improve distal perfusion for wound healing.  I have looked at the schedule and can fit him in this Wednesday.  I will also have an urgent referral sent to my podiatry colleagues for wound management. ? ?After discussing the risk and benefits of the above, [name] elected to proceed ? ?I asked that he continue his current medication regimen. ? ?[name] John, MD ?[facility] ?[phone]  ?

## 2021-09-10 NOTE — Op Note (Signed)
? ? ?Patient name: [name] MRN: [id number] DOB: 07/10/1955 Sex: male ? ?09/10/2021 ?Pre-operative Diagnosis: Right lower extremity Rutherford 5 critical limb ischemia ?Post-operative diagnosis:  Same ?Surgeon:  [name], MD ?Procedure Performed: ?1.  Ultrasound-guided micropuncture access of the left common femoral artery ?2.  Aortogram ?3.  Second-order cannulation, right lower extremity angiogram ?4.  Drug-coated balloon angioplasty of the superficial femoral artery-5 x 40 mm balloon ?5.  Device assisted closure, Mynx ?6.  55 minutes moderate sedation ? ? ?Indications: [name] is a 66 y.o. male presenting with right lower extremity tissue loss, left lower extremity rest pain.  [name] demonstrate monophasic waveforms at the ankle bilaterally with depressed toe pressures.  On exam, he had nonpalpable pulses in the feet. ? ?[name]'s peripheral arterial disease is defined as Rutherford 5 critical limb ischemia on the right, Rutherford 4 critical limb ischemia on the left. ?  ?I had a long conversation with him regarding the above.  He is very concerned about amputation, having a brother who required bilateral above-knee amputations prior to passing away. ?  ?[name] would benefit from bilateral lower extremity angiogram with emphasis on the right in an effort to define and improve distal perfusion for wound healing.  I have looked at the schedule and can fit him in this Wednesday.  I will also have an urgent referral sent to my podiatry colleagues for wound management. ?  ?After discussing the risk and benefits of the above, [name] elected to proceed ? ?Findings:  ?Aortogram: Bilateral renal arteries patent, normal infrarenal abdominal aorta, no flow-limiting stenosis in the iliac system  ? ?On the right: Normal common femoral artery, normal profunda, superficial femoral artery with mild disease, focal 95% stenosis at the distal SFA prior to the popliteal artery.  The popliteal artery is normal.   Ostia of the anterior tibial artery not appreciated.  The anterior tibial trunk is patent.  Both the peroneal and posterior tibial arteries occluded with significant collateralization in the calf.  There is reconstitution of the posterior tibial artery in the distal third of the calf with medial lateral plantar branches running into the foot. ? ?  ?Procedure:  The patient was identified in the holding area and taken to room 8.  The patient was then placed supine on the table and prepped and draped in the usual sterile fashion.  A time out was called.  Ultrasound was used to evaluate the left common femoral artery.  It was patent .  A digital ultrasound image was acquired.  A micropuncture needle was used to access the left common femoral artery under ultrasound guidance.  An 018 wire was advanced without resistance and a micropuncture sheath was placed.  The 018 wire was removed and a benson wire was placed.  The micropuncture sheath was exchanged for a 5 french sheath.  An omniflush catheter was advanced over the wire to the level of L-1.  An abdominal angiogram was obtained.  Next, using the omniflush catheter and a benson wire, the aortic bifurcation was crossed and the catheter was placed into the right external iliac artery and right runoff was obtained.  See results above. ? ?I made the decision to intervene on the superficial femoral artery in an effort to improve distal imaging for possible tibial intervention.  A 6 x 55 sheath was brought onto the field and parked in the mid SFA.  From this location the 95% stenosis was crossed using a Glidewire advantage followed by angiography to  demonstrate true lumen distally.  Next, a 5 x 40 mm drug-coated balloon was brought onto the field and expanded to cross the lesion.  Follow-up imaging demonstrated excellent result with resolution of the stenosis.  There were still areas within the superficial femoral artery with less than 50% stenosis.  These were not  intervened upon.  I performed follow-up angiography distally in an effort to define the ostia of the posterior tibial artery.  This was not appreciated. ? ?The distal posterior tibial artery that reconstitutes was roughly 3 mm in size.  I considered a retrograde access with possible balloon angioplasty should the wire travel through the occlusion into the popliteal artery.  Being that the distal target was so large, I elected to terminate the case and pursued vein mapping.  Due to the patient's age, should he have vein, I would offer femoral to posterior tibial artery bypass surgery.  If there is no vein, I would discuss retrograde access of the dorsalis pedis artery with subsequent balloon angioplasty possible stenting as patency rates are similar to those seen with prosthetic bypass. ? ? ?J. Melene Muller, MD ?[facility] ?Office: [phone] ? ?  ?

## 2021-09-11 ENCOUNTER — Encounter (HOSPITAL_COMMUNITY): Payer: Self-pay | Admitting: Vascular Surgery

## 2021-09-12 ENCOUNTER — Telehealth: Payer: Self-pay

## 2021-09-12 ENCOUNTER — Inpatient Hospital Stay (HOSPITAL_COMMUNITY)
Admission: EM | Admit: 2021-09-12 | Discharge: 2021-09-22 | DRG: 853 | Disposition: A | Discharge status: Skilled Nursing Facility | Payer: Medicare HMO | Source: Ambulatory Visit | Attending: Family Medicine | Admitting: Family Medicine

## 2021-09-12 ENCOUNTER — Encounter (HOSPITAL_COMMUNITY): Payer: Self-pay | Admitting: *Deleted

## 2021-09-12 ENCOUNTER — Other Ambulatory Visit: Payer: Self-pay

## 2021-09-12 DIAGNOSIS — I70261 Atherosclerosis of native arteries of extremities with gangrene, right leg: Secondary | ICD-10-CM | POA: Diagnosis present

## 2021-09-12 DIAGNOSIS — Z794 Long term (current) use of insulin: Secondary | ICD-10-CM

## 2021-09-12 DIAGNOSIS — Z7982 Long term (current) use of aspirin: Secondary | ICD-10-CM

## 2021-09-12 DIAGNOSIS — Z825 Family history of asthma and other chronic lower respiratory diseases: Secondary | ICD-10-CM

## 2021-09-12 DIAGNOSIS — Z87891 Personal history of nicotine dependence: Secondary | ICD-10-CM

## 2021-09-12 DIAGNOSIS — L089 Local infection of the skin and subcutaneous tissue, unspecified: Principal | ICD-10-CM

## 2021-09-12 DIAGNOSIS — E1165 Type 2 diabetes mellitus with hyperglycemia: Secondary | ICD-10-CM | POA: Diagnosis present

## 2021-09-12 DIAGNOSIS — N170 Acute kidney failure with tubular necrosis: Secondary | ICD-10-CM | POA: Diagnosis present

## 2021-09-12 DIAGNOSIS — E1142 Type 2 diabetes mellitus with diabetic polyneuropathy: Secondary | ICD-10-CM

## 2021-09-12 DIAGNOSIS — F32A Depression, unspecified: Secondary | ICD-10-CM

## 2021-09-12 DIAGNOSIS — L03119 Cellulitis of unspecified part of limb: Secondary | ICD-10-CM

## 2021-09-12 DIAGNOSIS — D75838 Other thrombocytosis: Secondary | ICD-10-CM | POA: Diagnosis present

## 2021-09-12 DIAGNOSIS — G4733 Obstructive sleep apnea (adult) (pediatric): Secondary | ICD-10-CM

## 2021-09-12 DIAGNOSIS — E871 Hypo-osmolality and hyponatremia: Secondary | ICD-10-CM | POA: Diagnosis present

## 2021-09-12 DIAGNOSIS — Z823 Family history of stroke: Secondary | ICD-10-CM

## 2021-09-12 DIAGNOSIS — E11621 Type 2 diabetes mellitus with foot ulcer: Secondary | ICD-10-CM | POA: Diagnosis present

## 2021-09-12 DIAGNOSIS — I96 Gangrene, not elsewhere classified: Secondary | ICD-10-CM

## 2021-09-12 DIAGNOSIS — F4310 Post-traumatic stress disorder, unspecified: Secondary | ICD-10-CM | POA: Diagnosis present

## 2021-09-12 DIAGNOSIS — Z79899 Other long term (current) drug therapy: Secondary | ICD-10-CM

## 2021-09-12 DIAGNOSIS — Z7951 Long term (current) use of inhaled steroids: Secondary | ICD-10-CM

## 2021-09-12 DIAGNOSIS — L03115 Cellulitis of right lower limb: Secondary | ICD-10-CM | POA: Diagnosis present

## 2021-09-12 DIAGNOSIS — K219 Gastro-esophageal reflux disease without esophagitis: Secondary | ICD-10-CM

## 2021-09-12 DIAGNOSIS — E11628 Type 2 diabetes mellitus with other skin complications: Secondary | ICD-10-CM | POA: Diagnosis present

## 2021-09-12 DIAGNOSIS — Z7984 Long term (current) use of oral hypoglycemic drugs: Secondary | ICD-10-CM

## 2021-09-12 DIAGNOSIS — E785 Hyperlipidemia, unspecified: Secondary | ICD-10-CM

## 2021-09-12 DIAGNOSIS — Z1611 Resistance to penicillins: Secondary | ICD-10-CM | POA: Diagnosis present

## 2021-09-12 DIAGNOSIS — L02611 Cutaneous abscess of right foot: Secondary | ICD-10-CM | POA: Diagnosis present

## 2021-09-12 DIAGNOSIS — N179 Acute kidney failure, unspecified: Secondary | ICD-10-CM

## 2021-09-12 DIAGNOSIS — E878 Other disorders of electrolyte and fluid balance, not elsewhere classified: Secondary | ICD-10-CM | POA: Diagnosis present

## 2021-09-12 DIAGNOSIS — L97519 Non-pressure chronic ulcer of other part of right foot with unspecified severity: Secondary | ICD-10-CM | POA: Diagnosis present

## 2021-09-12 DIAGNOSIS — Z8249 Family history of ischemic heart disease and other diseases of the circulatory system: Secondary | ICD-10-CM

## 2021-09-12 DIAGNOSIS — Z7902 Long term (current) use of antithrombotics/antiplatelets: Secondary | ICD-10-CM

## 2021-09-12 DIAGNOSIS — E1152 Type 2 diabetes mellitus with diabetic peripheral angiopathy with gangrene: Secondary | ICD-10-CM | POA: Diagnosis present

## 2021-09-12 DIAGNOSIS — R6520 Severe sepsis without septic shock: Secondary | ICD-10-CM | POA: Diagnosis present

## 2021-09-12 DIAGNOSIS — Z20822 Contact with and (suspected) exposure to covid-19: Secondary | ICD-10-CM | POA: Diagnosis present

## 2021-09-12 DIAGNOSIS — I152 Hypertension secondary to endocrine disorders: Secondary | ICD-10-CM

## 2021-09-12 DIAGNOSIS — B9620 Unspecified Escherichia coli [E. coli] as the cause of diseases classified elsewhere: Secondary | ICD-10-CM | POA: Diagnosis present

## 2021-09-12 DIAGNOSIS — E119 Type 2 diabetes mellitus without complications: Secondary | ICD-10-CM

## 2021-09-12 DIAGNOSIS — A419 Sepsis, unspecified organism: Principal | ICD-10-CM | POA: Diagnosis present

## 2021-09-12 DIAGNOSIS — I10 Essential (primary) hypertension: Secondary | ICD-10-CM | POA: Diagnosis present

## 2021-09-12 HISTORY — DX: Post-traumatic stress disorder, unspecified: F43.10

## 2021-09-12 LAB — COMPREHENSIVE METABOLIC PANEL
ALT: 32 U/L (ref 0–44)
AST: 27 U/L (ref 15–41)
Albumin: 3 g/dL — ABNORMAL LOW (ref 3.5–5.0)
Alkaline Phosphatase: 83 U/L (ref 38–126)
Anion gap: 15 (ref 5–15)
BUN: 25 mg/dL — ABNORMAL HIGH (ref 8–23)
CO2: 21 mmol/L — ABNORMAL LOW (ref 22–32)
Calcium: 8.8 mg/dL — ABNORMAL LOW (ref 8.9–10.3)
Chloride: 96 mmol/L — ABNORMAL LOW (ref 98–111)
Creatinine, Ser: 1.85 mg/dL — ABNORMAL HIGH (ref 0.61–1.24)
GFR, Estimated: 39 mL/min — ABNORMAL LOW (ref >60)
Glucose, Bld: 257 mg/dL — ABNORMAL HIGH (ref 70–99)
Potassium: 4 mmol/L (ref 3.5–5.1)
Sodium: 132 mmol/L — ABNORMAL LOW (ref 135–145)
Total Bilirubin: 0.6 mg/dL (ref 0.3–1.2)
Total Protein: 8 g/dL (ref 6.5–8.1)

## 2021-09-12 LAB — CBC
HCT: 40.3 % (ref 39.0–52.0)
Hemoglobin: 13.1 g/dL (ref 13.0–17.0)
MCH: 27.9 pg (ref 26.0–34.0)
MCHC: 32.5 g/dL (ref 30.0–36.0)
MCV: 85.7 fL (ref 80.0–100.0)
Platelets: 623 10*3/uL — ABNORMAL HIGH (ref 150–400)
RBC: 4.7 MIL/uL (ref 4.22–5.81)
RDW: 13.8 % (ref 11.5–15.5)
WBC: 21.8 10*3/uL — ABNORMAL HIGH (ref 4.0–10.5)
nRBC: 0 % (ref 0.0–0.2)

## 2021-09-12 LAB — CBG MONITORING, ED: Glucose-Capillary: 252 mg/dL — ABNORMAL HIGH (ref 70–99)

## 2021-09-12 NOTE — ED Triage Notes (Signed)
The pt has diabetes and tonight he was going to soak his rt foot  he discovered that his  4th toe was black and it had not been that color ?

## 2021-09-12 NOTE — Telephone Encounter (Signed)
Pt's fiance Evelena Peat called to let us know pt thought he was to call about needing antibiotics. Pt thought he might have needed them for a "sore" that he has had since before his angiogram this week. She said it is looking better to her. He has a f/u appt next week. I have advised her to call us for a sooner appt if the sore does not continue to improve.  ?

## 2021-09-13 ENCOUNTER — Emergency Department (HOSPITAL_COMMUNITY): Payer: Medicare HMO

## 2021-09-13 DIAGNOSIS — L03119 Cellulitis of unspecified part of limb: Secondary | ICD-10-CM | POA: Diagnosis present

## 2021-09-13 DIAGNOSIS — E785 Hyperlipidemia, unspecified: Secondary | ICD-10-CM

## 2021-09-13 DIAGNOSIS — F32A Depression, unspecified: Secondary | ICD-10-CM

## 2021-09-13 DIAGNOSIS — E1152 Type 2 diabetes mellitus with diabetic peripheral angiopathy with gangrene: Secondary | ICD-10-CM | POA: Diagnosis present

## 2021-09-13 DIAGNOSIS — Z794 Long term (current) use of insulin: Secondary | ICD-10-CM | POA: Diagnosis not present

## 2021-09-13 DIAGNOSIS — Z01810 Encounter for preprocedural cardiovascular examination: Secondary | ICD-10-CM | POA: Diagnosis not present

## 2021-09-13 DIAGNOSIS — Z8249 Family history of ischemic heart disease and other diseases of the circulatory system: Secondary | ICD-10-CM | POA: Diagnosis not present

## 2021-09-13 DIAGNOSIS — Z20822 Contact with and (suspected) exposure to covid-19: Secondary | ICD-10-CM | POA: Diagnosis present

## 2021-09-13 DIAGNOSIS — K219 Gastro-esophageal reflux disease without esophagitis: Secondary | ICD-10-CM

## 2021-09-13 DIAGNOSIS — E11628 Type 2 diabetes mellitus with other skin complications: Secondary | ICD-10-CM | POA: Diagnosis present

## 2021-09-13 DIAGNOSIS — D75838 Other thrombocytosis: Secondary | ICD-10-CM | POA: Diagnosis present

## 2021-09-13 DIAGNOSIS — E11621 Type 2 diabetes mellitus with foot ulcer: Secondary | ICD-10-CM | POA: Diagnosis present

## 2021-09-13 DIAGNOSIS — Z7984 Long term (current) use of oral hypoglycemic drugs: Secondary | ICD-10-CM | POA: Diagnosis not present

## 2021-09-13 DIAGNOSIS — Z87891 Personal history of nicotine dependence: Secondary | ICD-10-CM | POA: Diagnosis not present

## 2021-09-13 DIAGNOSIS — L97519 Non-pressure chronic ulcer of other part of right foot with unspecified severity: Secondary | ICD-10-CM | POA: Diagnosis present

## 2021-09-13 DIAGNOSIS — E1165 Type 2 diabetes mellitus with hyperglycemia: Secondary | ICD-10-CM | POA: Diagnosis present

## 2021-09-13 DIAGNOSIS — E1151 Type 2 diabetes mellitus with diabetic peripheral angiopathy without gangrene: Secondary | ICD-10-CM | POA: Diagnosis not present

## 2021-09-13 DIAGNOSIS — L089 Local infection of the skin and subcutaneous tissue, unspecified: Secondary | ICD-10-CM

## 2021-09-13 DIAGNOSIS — I10 Essential (primary) hypertension: Secondary | ICD-10-CM | POA: Diagnosis present

## 2021-09-13 DIAGNOSIS — L03115 Cellulitis of right lower limb: Secondary | ICD-10-CM | POA: Diagnosis not present

## 2021-09-13 DIAGNOSIS — I70221 Atherosclerosis of native arteries of extremities with rest pain, right leg: Secondary | ICD-10-CM | POA: Diagnosis not present

## 2021-09-13 DIAGNOSIS — I96 Gangrene, not elsewhere classified: Secondary | ICD-10-CM | POA: Diagnosis not present

## 2021-09-13 DIAGNOSIS — I70261 Atherosclerosis of native arteries of extremities with gangrene, right leg: Secondary | ICD-10-CM | POA: Diagnosis present

## 2021-09-13 DIAGNOSIS — E1142 Type 2 diabetes mellitus with diabetic polyneuropathy: Secondary | ICD-10-CM | POA: Diagnosis present

## 2021-09-13 DIAGNOSIS — Z1611 Resistance to penicillins: Secondary | ICD-10-CM | POA: Diagnosis present

## 2021-09-13 DIAGNOSIS — L0390 Cellulitis, unspecified: Secondary | ICD-10-CM | POA: Diagnosis not present

## 2021-09-13 DIAGNOSIS — E878 Other disorders of electrolyte and fluid balance, not elsewhere classified: Secondary | ICD-10-CM | POA: Diagnosis present

## 2021-09-13 DIAGNOSIS — E871 Hypo-osmolality and hyponatremia: Secondary | ICD-10-CM | POA: Diagnosis present

## 2021-09-13 DIAGNOSIS — I152 Hypertension secondary to endocrine disorders: Secondary | ICD-10-CM

## 2021-09-13 DIAGNOSIS — E1169 Type 2 diabetes mellitus with other specified complication: Secondary | ICD-10-CM | POA: Diagnosis not present

## 2021-09-13 DIAGNOSIS — N179 Acute kidney failure, unspecified: Secondary | ICD-10-CM | POA: Diagnosis not present

## 2021-09-13 DIAGNOSIS — M869 Osteomyelitis, unspecified: Secondary | ICD-10-CM | POA: Diagnosis not present

## 2021-09-13 DIAGNOSIS — N170 Acute kidney failure with tubular necrosis: Secondary | ICD-10-CM | POA: Diagnosis present

## 2021-09-13 DIAGNOSIS — G4733 Obstructive sleep apnea (adult) (pediatric): Secondary | ICD-10-CM

## 2021-09-13 DIAGNOSIS — L02611 Cutaneous abscess of right foot: Secondary | ICD-10-CM | POA: Diagnosis present

## 2021-09-13 DIAGNOSIS — F4310 Post-traumatic stress disorder, unspecified: Secondary | ICD-10-CM | POA: Diagnosis present

## 2021-09-13 DIAGNOSIS — A419 Sepsis, unspecified organism: Secondary | ICD-10-CM | POA: Diagnosis present

## 2021-09-13 LAB — HIV ANTIBODY (ROUTINE TESTING W REFLEX): HIV Screen 4th Generation wRfx: NONREACTIVE

## 2021-09-13 LAB — RESP PANEL BY RT-PCR (FLU A&B, COVID) ARPGX2
Influenza A by PCR: NEGATIVE
Influenza B by PCR: NEGATIVE
SARS Coronavirus 2 by RT PCR: NEGATIVE

## 2021-09-13 LAB — CBC
HCT: 37.3 % — ABNORMAL LOW (ref 39.0–52.0)
Hemoglobin: 12.4 g/dL — ABNORMAL LOW (ref 13.0–17.0)
MCH: 28.6 pg (ref 26.0–34.0)
MCHC: 33.2 g/dL (ref 30.0–36.0)
MCV: 85.9 fL (ref 80.0–100.0)
Platelets: 490 10*3/uL — ABNORMAL HIGH (ref 150–400)
RBC: 4.34 MIL/uL (ref 4.22–5.81)
RDW: 13.8 % (ref 11.5–15.5)
WBC: 20.1 10*3/uL — ABNORMAL HIGH (ref 4.0–10.5)
nRBC: 0 % (ref 0.0–0.2)

## 2021-09-13 LAB — BASIC METABOLIC PANEL
Anion gap: 14 (ref 5–15)
BUN: 27 mg/dL — ABNORMAL HIGH (ref 8–23)
CO2: 21 mmol/L — ABNORMAL LOW (ref 22–32)
Calcium: 8.4 mg/dL — ABNORMAL LOW (ref 8.9–10.3)
Chloride: 97 mmol/L — ABNORMAL LOW (ref 98–111)
Creatinine, Ser: 2.14 mg/dL — ABNORMAL HIGH (ref 0.61–1.24)
GFR, Estimated: 33 mL/min — ABNORMAL LOW (ref >60)
Glucose, Bld: 200 mg/dL — ABNORMAL HIGH (ref 70–99)
Potassium: 3.8 mmol/L (ref 3.5–5.1)
Sodium: 132 mmol/L — ABNORMAL LOW (ref 135–145)

## 2021-09-13 LAB — LACTIC ACID, PLASMA
Lactic Acid, Venous: 1.8 mmol/L (ref 0.5–1.9)
Lactic Acid, Venous: 1.4 mmol/L (ref 0.5–1.9)

## 2021-09-13 LAB — GLUCOSE, CAPILLARY
Glucose-Capillary: 91 mg/dL (ref 70–99)
Glucose-Capillary: 206 mg/dL — ABNORMAL HIGH (ref 70–99)

## 2021-09-13 LAB — CBG MONITORING, ED
Glucose-Capillary: 148 mg/dL — ABNORMAL HIGH (ref 70–99)
Glucose-Capillary: 114 mg/dL — ABNORMAL HIGH (ref 70–99)

## 2021-09-13 MED ORDER — MECLIZINE HCL 25 MG PO TABS
25.0000 mg | ORAL_TABLET | Freq: Three times a day (TID) | ORAL | Status: DC | PRN
  Filled 2021-09-13: qty 1

## 2021-09-13 MED ORDER — VANCOMYCIN HCL IN DEXTROSE 1-5 GM/200ML-% IV SOLN
1000.0000 mg | INTRAVENOUS | Status: DC
Start: 2021-09-13 — End: 2021-09-13

## 2021-09-13 MED ORDER — METRONIDAZOLE 500 MG/100ML IV SOLN
500.0000 mg | Freq: Three times a day (TID) | INTRAVENOUS | Status: DC
Start: 2021-09-13 — End: 2021-09-21
  Administered 2021-09-13 – 2021-09-21 (×24): 500 mg via INTRAVENOUS
  Filled 2021-09-13 (×24): qty 100

## 2021-09-13 MED ORDER — SODIUM CHLORIDE 0.9 % IV SOLN
2.0000 g | Freq: Two times a day (BID) | INTRAVENOUS | Status: DC
  Administered 2021-09-13 (×2): 2 g via INTRAVENOUS
  Filled 2021-09-13 (×3): qty 2

## 2021-09-13 MED ORDER — METOPROLOL TARTRATE 50 MG PO TABS
50.0000 mg | ORAL_TABLET | Freq: Two times a day (BID) | ORAL | Status: DC
  Administered 2021-09-13 – 2021-09-22 (×19): 50 mg via ORAL
  Filled 2021-09-13: qty 2
  Filled 2021-09-13 (×18): qty 1

## 2021-09-13 MED ORDER — PIPERACILLIN-TAZOBACTAM 3.375 G IVPB 30 MIN
3.3750 g | Freq: Once | INTRAVENOUS | Status: AC
  Administered 2021-09-13: 3.375 g via INTRAVENOUS
  Filled 2021-09-13: qty 50

## 2021-09-13 MED ORDER — SEMAGLUTIDE (1 MG/DOSE) 2 MG/1.5ML ~~LOC~~ SOPN: 1.0000 mg | PEN_INJECTOR | SUBCUTANEOUS | Status: DC

## 2021-09-13 MED ORDER — VANCOMYCIN HCL 1750 MG/350ML IV SOLN: 1750.0000 mg | INTRAVENOUS | Status: DC

## 2021-09-13 MED ORDER — METHOCARBAMOL 500 MG PO TABS
500.0000 mg | ORAL_TABLET | Freq: Three times a day (TID) | ORAL | Status: DC
  Administered 2021-09-13 – 2021-09-22 (×31): 500 mg via ORAL
  Filled 2021-09-13 (×31): qty 1

## 2021-09-13 MED ORDER — VITAMIN D 25 MCG (1000 UNIT) PO TABS
2000.0000 [IU] | ORAL_TABLET | Freq: Two times a day (BID) | ORAL | Status: DC
  Administered 2021-09-13 – 2021-09-22 (×17): 2000 [IU] via ORAL
  Filled 2021-09-13 (×17): qty 2

## 2021-09-13 MED ORDER — ATORVASTATIN CALCIUM 80 MG PO TABS
80.0000 mg | ORAL_TABLET | Freq: Every day | ORAL | Status: DC
  Administered 2021-09-13 – 2021-09-22 (×8): 80 mg via ORAL
  Filled 2021-09-13 (×8): qty 1

## 2021-09-13 MED ORDER — EMPAGLIFLOZIN 25 MG PO TABS
25.0000 mg | ORAL_TABLET | Freq: Every day | ORAL | Status: DC
  Administered 2021-09-13: 25 mg via ORAL
  Filled 2021-09-13: qty 1

## 2021-09-13 MED ORDER — GABAPENTIN 300 MG PO CAPS
600.0000 mg | ORAL_CAPSULE | Freq: Three times a day (TID) | ORAL | Status: DC
  Administered 2021-09-13 – 2021-09-22 (×24): 600 mg via ORAL
  Filled 2021-09-13 (×24): qty 2

## 2021-09-13 MED ORDER — PIPERACILLIN-TAZOBACTAM 3.375 G IVPB: 3.3750 g | Freq: Three times a day (TID) | INTRAVENOUS | Status: DC

## 2021-09-13 MED ORDER — INSULIN ASPART PROT & ASPART (70-30 MIX) 100 UNIT/ML ~~LOC~~ SUSP: 80.0000 [IU] | Freq: Two times a day (BID) | SUBCUTANEOUS | Status: DC

## 2021-09-13 MED ORDER — BUPROPION HCL ER (XL) 300 MG PO TB24
300.0000 mg | ORAL_TABLET | Freq: Every day | ORAL | Status: DC
  Administered 2021-09-13 – 2021-09-22 (×8): 300 mg via ORAL
  Filled 2021-09-13: qty 1
  Filled 2021-09-13: qty 2
  Filled 2021-09-13 (×6): qty 1

## 2021-09-13 MED ORDER — ONDANSETRON HCL 4 MG/2ML IJ SOLN: 4.0000 mg | Freq: Four times a day (QID) | INTRAMUSCULAR | Status: DC | PRN

## 2021-09-13 MED ORDER — ONDANSETRON HCL 4 MG PO TABS: 4.0000 mg | ORAL_TABLET | Freq: Four times a day (QID) | ORAL | Status: DC | PRN

## 2021-09-13 MED ORDER — ALBUTEROL SULFATE (2.5 MG/3ML) 0.083% IN NEBU: 2.5000 mg | INHALATION_SOLUTION | Freq: Four times a day (QID) | RESPIRATORY_TRACT | Status: DC | PRN

## 2021-09-13 MED ORDER — LISINOPRIL 20 MG PO TABS: 40.0000 mg | ORAL_TABLET | Freq: Every day | ORAL | Status: DC

## 2021-09-13 MED ORDER — OXYCODONE HCL 5 MG PO TABS
15.0000 mg | ORAL_TABLET | Freq: Four times a day (QID) | ORAL | Status: DC | PRN
  Administered 2021-09-15 – 2021-09-22 (×14): 15 mg via ORAL
  Filled 2021-09-13 (×15): qty 3

## 2021-09-13 MED ORDER — LACTATED RINGERS IV BOLUS (SEPSIS)
1000.0000 mL | Freq: Once | INTRAVENOUS | Status: AC
Start: 2021-09-13 — End: 2021-09-13
  Administered 2021-09-13: 1000 mL via INTRAVENOUS

## 2021-09-13 MED ORDER — DONEPEZIL HCL 5 MG PO TABS
5.0000 mg | ORAL_TABLET | Freq: Every day | ORAL | Status: DC
  Administered 2021-09-13 – 2021-09-22 (×8): 5 mg via ORAL
  Filled 2021-09-13 (×8): qty 1

## 2021-09-13 MED ORDER — ENOXAPARIN SODIUM 40 MG/0.4ML IJ SOSY
40.0000 mg | PREFILLED_SYRINGE | Freq: Every day | INTRAMUSCULAR | Status: DC
  Administered 2021-09-13 – 2021-09-22 (×7): 40 mg via SUBCUTANEOUS
  Filled 2021-09-13 (×7): qty 0.4

## 2021-09-13 MED ORDER — FLUTICASONE FUROATE-VILANTEROL 100-25 MCG/ACT IN AEPB
1.0000 | INHALATION_SPRAY | Freq: Every day | RESPIRATORY_TRACT | Status: DC
  Administered 2021-09-16 – 2021-09-22 (×7): 1 via RESPIRATORY_TRACT
  Filled 2021-09-13 (×3): qty 28

## 2021-09-13 MED ORDER — VANCOMYCIN HCL 2000 MG/400ML IV SOLN
2000.0000 mg | Freq: Once | INTRAVENOUS | Status: AC
  Administered 2021-09-13: 2000 mg via INTRAVENOUS
  Filled 2021-09-13: qty 400

## 2021-09-13 MED ORDER — HYDROCHLOROTHIAZIDE 12.5 MG PO TABS: 12.5000 mg | ORAL_TABLET | Freq: Every day | ORAL | Status: DC

## 2021-09-13 MED ORDER — ACETAMINOPHEN 650 MG RE SUPP: 650.0000 mg | Freq: Four times a day (QID) | RECTAL | Status: DC | PRN

## 2021-09-13 MED ORDER — SODIUM CHLORIDE 0.9 % IV SOLN: INTRAVENOUS | Status: DC

## 2021-09-13 MED ORDER — OLANZAPINE 2.5 MG PO TABS
2.5000 mg | ORAL_TABLET | Freq: Every day | ORAL | Status: DC
  Administered 2021-09-13 – 2021-09-21 (×9): 2.5 mg via ORAL
  Filled 2021-09-13 (×12): qty 1

## 2021-09-13 MED ORDER — TRAMADOL HCL 50 MG PO TABS
50.0000 mg | ORAL_TABLET | Freq: Four times a day (QID) | ORAL | Status: DC | PRN
  Administered 2021-09-17 – 2021-09-20 (×4): 50 mg via ORAL
  Filled 2021-09-13 (×4): qty 1

## 2021-09-13 MED ORDER — POLYVINYL ALCOHOL 1.4 % OP SOLN: 1.0000 [drp] | Freq: Four times a day (QID) | OPHTHALMIC | Status: DC | PRN

## 2021-09-13 MED ORDER — PANTOPRAZOLE SODIUM 40 MG PO TBEC
40.0000 mg | DELAYED_RELEASE_TABLET | Freq: Every day | ORAL | Status: DC
  Administered 2021-09-13 – 2021-09-22 (×8): 40 mg via ORAL
  Filled 2021-09-13 (×8): qty 1

## 2021-09-13 MED ORDER — VANCOMYCIN HCL IN DEXTROSE 1-5 GM/200ML-% IV SOLN: 1000.0000 mg | Freq: Once | INTRAVENOUS | Status: DC

## 2021-09-13 MED ORDER — INSULIN ASPART 100 UNIT/ML IJ SOLN
0.0000 [IU] | Freq: Three times a day (TID) | INTRAMUSCULAR | Status: DC
  Administered 2021-09-13: 5 [IU] via SUBCUTANEOUS
  Administered 2021-09-13: 2 [IU] via SUBCUTANEOUS
  Administered 2021-09-14: 5 [IU] via SUBCUTANEOUS
  Administered 2021-09-14: 2 [IU] via SUBCUTANEOUS
  Administered 2021-09-14 (×2): 3 [IU] via SUBCUTANEOUS
  Administered 2021-09-15 – 2021-09-16 (×5): 2 [IU] via SUBCUTANEOUS
  Administered 2021-09-17: 3 [IU] via SUBCUTANEOUS
  Administered 2021-09-17: 2 [IU] via SUBCUTANEOUS
  Administered 2021-09-17: 3 [IU] via SUBCUTANEOUS
  Administered 2021-09-18: 2 [IU] via SUBCUTANEOUS
  Administered 2021-09-18 (×2): 3 [IU] via SUBCUTANEOUS
  Administered 2021-09-18 – 2021-09-19 (×3): 2 [IU] via SUBCUTANEOUS
  Administered 2021-09-19 – 2021-09-20 (×2): 3 [IU] via SUBCUTANEOUS
  Administered 2021-09-20: 5 [IU] via SUBCUTANEOUS
  Administered 2021-09-20: 3 [IU] via SUBCUTANEOUS
  Administered 2021-09-21: 5 [IU] via SUBCUTANEOUS
  Administered 2021-09-21: 2 [IU] via SUBCUTANEOUS
  Administered 2021-09-21: 5 [IU] via SUBCUTANEOUS
  Administered 2021-09-21 – 2021-09-22 (×2): 3 [IU] via SUBCUTANEOUS
  Administered 2021-09-22: 5 [IU] via SUBCUTANEOUS

## 2021-09-13 MED ORDER — MAGNESIUM HYDROXIDE 400 MG/5ML PO SUSP
30.0000 mL | Freq: Every day | ORAL | Status: DC | PRN
  Filled 2021-09-13: qty 30

## 2021-09-13 MED ORDER — OXCARBAZEPINE 150 MG PO TABS
150.0000 mg | ORAL_TABLET | Freq: Two times a day (BID) | ORAL | Status: DC
  Administered 2021-09-13 – 2021-09-22 (×18): 150 mg via ORAL
  Filled 2021-09-13 (×21): qty 1

## 2021-09-13 MED ORDER — SODIUM CHLORIDE 0.9 % IV SOLN: 2.0000 g | Freq: Once | INTRAVENOUS | Status: DC

## 2021-09-13 MED ORDER — IRBESARTAN 300 MG PO TABS
300.0000 mg | ORAL_TABLET | Freq: Every day | ORAL | Status: DC
  Filled 2021-09-13: qty 1

## 2021-09-13 MED ORDER — CYCLOBENZAPRINE HCL 10 MG PO TABS: 10.0000 mg | ORAL_TABLET | Freq: Two times a day (BID) | ORAL | Status: DC | PRN

## 2021-09-13 MED ORDER — GLIPIZIDE ER 2.5 MG PO TB24
2.5000 mg | ORAL_TABLET | Freq: Every day | ORAL | Status: DC
Start: 2021-09-13 — End: 2021-09-14
  Administered 2021-09-13: 2.5 mg via ORAL
  Filled 2021-09-13 (×2): qty 1

## 2021-09-13 MED ORDER — OLMESARTAN MEDOXOMIL-HCTZ 40-12.5 MG PO TABS: 1.0000 | ORAL_TABLET | Freq: Every day | ORAL | Status: DC

## 2021-09-13 MED ORDER — TRAZODONE HCL 50 MG PO TABS
25.0000 mg | ORAL_TABLET | Freq: Every evening | ORAL | Status: DC | PRN
  Filled 2021-09-13: qty 1

## 2021-09-13 MED ORDER — ACETAMINOPHEN 325 MG PO TABS
650.0000 mg | ORAL_TABLET | Freq: Four times a day (QID) | ORAL | Status: DC | PRN
  Administered 2021-09-13 – 2021-09-17 (×4): 650 mg via ORAL
  Filled 2021-09-13 (×4): qty 2

## 2021-09-13 NOTE — ED Notes (Signed)
Received verbal report from Chris C RN at this time ?

## 2021-09-13 NOTE — ED Notes (Signed)
Pts wifes   [phone] cell phone ?

## 2021-09-13 NOTE — Assessment & Plan Note (Signed)
-   We will continue Wellbutrin XL. 

## 2021-09-13 NOTE — ED Provider Notes (Signed)
[facility] EMERGENCY DEPARTMENT Provider Note   CSN: [id number] Arrival date & time: 09/12/21  2246     History  No chief complaint on file.   [name] is a 66 y.o. male.  Patient with diabetes, known critical limb ischemia, right lower extremity angiogram performed 2 days ago with intervention of angioplasty --presents to the emergency department today for evaluation of right foot infection.  Patient states that symptoms began about 24 hours ago.  He developed some redness over the top portion of the foot.  This has progressed into blistering, lymphangitis, dark discoloration of the fourth toe.  No fevers or chills.  No vomiting or diarrhea.  Patient states that the area does not hurt.  It has been more rapidly progressive this evening.  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  No drainage or discharge from this area.      Home Medications Prior to Admission medications   Medication Sig Start Date End Date Taking? Authorizing Provider  acetaminophen (TYLENOL) 500 MG tablet Take 500 mg by mouth every 8 (eight) hours as needed for moderate pain.    [provider]  albuterol (VENTOLIN HFA) 108 (90 Base) MCG/ACT inhaler Inhale 2 puffs into the lungs every 6 (six) hours as needed for wheezing or shortness of breath. 02/11/21   Chesley Mires, MD  aspirin EC 81 MG tablet Take 1 tablet (81 mg total) by mouth daily. Swallow whole. 09/10/21 09/10/22  [name], MD  atorvastatin (LIPITOR) 80 MG tablet Take 80 mg by mouth daily.    [provider]  buPROPion (WELLBUTRIN XL) 300 MG 24 hr tablet Take 300 mg by mouth daily.    [provider]  carboxymethylcellulose (REFRESH PLUS) 0.5 % SOLN Place 1 drop into both eyes 4 (four) times daily as needed (dry eyes).    [provider]  Cholecalciferol (VITAMIN D3) 2000 UNITS TABS Take 2,000 Units by mouth 2 (two) times daily after a meal.    [provider]  clopidogrel (PLAVIX) 75 MG tablet Take 1 tablet (75 mg total) by mouth daily. 09/10/21 09/10/22  [name], MD  cyclobenzaprine (FLEXERIL) 10 MG tablet Take 1 tablet (10 mg total) by mouth 2 (two) times daily as needed for muscle spasms. 07/25/20   Lucrezia Starch, MD  diclofenac (VOLTAREN) 75 MG EC tablet Take 75 mg by mouth 2 (two) times daily. 02/21/21   [provider]  donepezil (ARICEPT) 5 MG tablet Take 5 mg by mouth daily. 02/21/21   [provider]  empagliflozin (JARDIANCE) 25 MG TABS tablet Take 25 mg by mouth daily.    [provider]  fluticasone furoate-vilanterol (BREO ELLIPTA) 100-25 MCG/INH AEPB Inhale 1 puff into the lungs daily. 02/11/21   Chesley Mires, MD  gabapentin (NEURONTIN) 600 MG tablet Take 600 mg by mouth 3 (three) times daily. 02/21/21   [provider]  GLIPIZIDE PO Take 1 tablet by mouth 2 (two) times daily.    [provider]  ibuprofen (ADVIL,MOTRIN) 600 MG tablet Take 600 mg by mouth every 6 (six) hours as needed for pain.    [provider]  insulin aspart protamine- aspart (NOVOLOG MIX 70/30) (70-30) 100 UNIT/ML injection Inject 80 Units into the skin 2 (two) times daily with a meal.    [provider]  lisinopril (ZESTRIL) 40 MG tablet Take 40 mg by mouth daily.    [provider]  meclizine (ANTIVERT) 25 MG tablet Take 1 tablet (25 mg total) by mouth 3 (three) times daily as needed for dizziness. 04/05/21   Dorie Rank, MD  metFORMIN (GLUCOPHAGE) 1000 MG tablet Take 1,000 mg by mouth 2 (two) times daily with a meal.    [provider]  methocarbamol (ROBAXIN) 500 MG tablet Take 1 tablet (500 mg total) by mouth 4 (four) times daily. 05/17/15   Carlisle Cater, PA-C  metoprolol tartrate (LOPRESSOR) 50 MG tablet Take 50 mg by mouth 2 (two) times daily.    [provider]  OLANZapine (ZYPREXA) 2.5 MG tablet Take 2.5 mg by mouth at bedtime. 03/06/21   [provider]  olmesartan-hydrochlorothiazide (BENICAR HCT) 40-12.5 MG tablet Take 1 tablet by mouth daily. 08/08/21   [provider]  omeprazole (PRILOSEC) 20 MG capsule Take 20 mg by mouth daily.    [provider]  ondansetron (ZOFRAN ODT) 8 MG disintegrating tablet Take 1 tablet (8 mg total) by mouth every 8 (eight) hours as needed for nausea or vomiting. Patient not taking: Reported on 09/05/2021 04/05/21   Dorie Rank, MD  OXcarbazepine (TRILEPTAL) 150 MG tablet Take 150 mg by mouth 2 (two) times daily.    [provider]  oxyCODONE (ROXICODONE) 15 MG immediate release tablet Take 15 mg by mouth 4 (four) times daily as needed for pain. 03/21/21   [provider]  Semaglutide, 1 MG/DOSE, (OZEMPIC, 1 MG/DOSE,) 2 MG/1.5ML SOPN Inject 1 mg into the skin every Sunday.    [provider]  sildenafil (VIAGRA) 100 MG tablet Take 100 mg by mouth daily as needed for erectile dysfunction.    [provider]  traMADol (ULTRAM) 50 MG tablet Take 1 tablet (50 mg total) by mouth every 6 (six) hours as needed. 09/17/16   Hedges, Dellis Filbert, PA-C      Allergies    Gemfibrozil, Oxycontin [oxycodone hcl], and Simvastatin    Review of Systems   Review of Systems  Physical Exam Updated Vital Signs BP 100/68 (BP Location: Right Arm)    Pulse 70    Temp 97.9 F (36.6 C) (Oral)    Resp 16    Ht 6' (1.829 m)    Wt 106.1 kg    SpO2 98%    BMI 31.72 kg/m   Physical Exam Vitals and nursing note reviewed.  Constitutional:      General: He is not in acute distress.    Appearance: He is well-developed.  HENT:     Head: Normocephalic and atraumatic.  Eyes:     General:        Right eye: No discharge.        Left eye: No discharge.     Conjunctiva/sclera: Conjunctivae normal.  Cardiovascular:     Rate and Rhythm: Normal rate and regular rhythm.     Heart sounds: Normal heart sounds.  Pulmonary:     Effort: Pulmonary effort is normal.     Breath sounds:  Normal breath sounds.  Abdominal:     Palpations: Abdomen is soft.     Tenderness: There is no abdominal tenderness.  Musculoskeletal:     Cervical back: Normal range of motion and neck supple.     Comments: Right foot: Dusky fourth toe with hemorrhagic bullae noted just proximal to this toe.  There is erythema consistent with cellulitis over the forefoot, worse laterally extending to the ankle.  More proximally there is streaking lymphangitis to the knee.  Chronic appearing ulceration  to the sole of the foot without active drainage or discharge, no significant surrounding cellulitis.  Skin:    General: Skin is warm and dry.  Neurological:     Mental Status: He is alert.          ED Results / Procedures / Treatments   Labs (all labs ordered are listed, but only abnormal results are displayed) Labs Reviewed  COMPREHENSIVE METABOLIC PANEL - Abnormal; Notable for the following components:      Result Value   Sodium 132 (*)    Chloride 96 (*)    CO2 21 (*)    Glucose, Bld 257 (*)    BUN 25 (*)    Creatinine, Ser 1.85 (*)    Calcium 8.8 (*)    Albumin 3.0 (*)    GFR, Estimated 39 (*)    All other components within normal limits  CBC - Abnormal; Notable for the following components:   WBC 21.8 (*)    Platelets 623 (*)    All other components within normal limits  CBG MONITORING, ED - Abnormal; Notable for the following components:   Glucose-Capillary 252 (*)    All other components within normal limits  RESP PANEL BY RT-PCR (FLU A&B, COVID) ARPGX2  CULTURE, BLOOD (ROUTINE X 2)  CULTURE, BLOOD (ROUTINE X 2)  LACTIC ACID, PLASMA  LACTIC ACID, PLASMA    EKG None  Radiology DG Foot Complete Right  Result Date: 09/13/2021 CLINICAL DATA:  Infection.  Redness, swelling along dorsum of foot. EXAM: RIGHT FOOT COMPLETE - 3+ VIEW COMPARISON:  None. FINDINGS: Soft tissue swelling in the midfoot/forefoot region. No acute bony abnormality. Specifically, no fracture, subluxation,  or dislocation. Joint spaces maintained. No bone destruction to suggest osteomyelitis. No radiopaque foreign bodies. IMPRESSION: No acute bony abnormality. Electronically Signed   By: Rolm Baptise M.D.   On: 09/13/2021 01:18    Procedures Procedures    Medications Ordered in ED Medications  lactated ringers bolus 1,000 mL (has no administration in time range)  vancomycin (VANCOREADY) IVPB 2000 mg/400 mL (has no administration in time range)  piperacillin-tazobactam (ZOSYN) IVPB 3.375 g (has no administration in time range)    ED Course/ Medical Decision Making/ A&P    Patient seen and examined. History obtained directly from patient, wife who gives details regarding progression of infection, and recent vascular procedure notes work-up including labs, imaging, EKG ordered in triage, if performed, were reviewed.    Labs/EKG: Independently reviewed and interpreted.  This included: CBC showing leukocytosis of 21.8 and elevated platelets likely acute phase reactant; CMP showing creatinine 1.85/BUN 25 elevated from patient's baseline which appears normal, glucose of 257 with normal anion gap, mild hyponatremia with normal potassium.  Added blood cultures, lactic acid.  Imaging: Independently visualized and interpreted.  This included: X-ray of foot  Medications/Fluids: Ordered: IV Zosyn, IV vancomycin   Most recent vital signs reviewed and are as follows: BP 100/68 (BP Location: Right Arm)    Pulse 70    Temp 97.9 F (36.6 C) (Oral)    Resp 16    Ht 6' (1.829 m)    Wt 106.1 kg    SpO2 98%    BMI 31.72 kg/m   Initial impression: Diabetic foot infection in setting of recent angioplasty of the right lower extremity, doubt acute arterial occlusion.  Patient does not appear toxic.  1:50 AM Patient seen at bedside with Dr. Leonette Monarch. We used bedside US to confirm blood flow in foot. DP pulse is weakly  visualized at the distal ankle, PT pulse more prominent.   Reassessment performed. Patient  appears comfortable.   Imaging personally visualized and interpreted including: X-ray, agree no bony abnormality.   Reviewed pertinent lab work and imaging with patient at bedside. Questions answered.   Most current vital signs reviewed and are as follows: BP 100/68 (BP Location: Right Arm)    Pulse 70    Temp 97.9 F (36.6 C) (Oral)    Resp 16    Ht 6' (1.829 m)    Wt 106.1 kg    SpO2 98%    BMI 31.72 kg/m   Plan: Admit to hospital. Consulted with Dr. Sidney Ace of [facility] who will see.                            Medical Decision Making Amount and/or Complexity of Data Reviewed Labs: ordered. Radiology: ordered.  Risk Prescription drug management. Decision regarding hospitalization.   Patient presents with progressive redness and swelling of the right foot with lymphangitis.  This is concerning for infection in setting of diabetes.  Patient with recent angioplasty of this lower extremity.  Considered ischemic foot and toe, ultrasound confirms blood flow.  Also no significant pain, pallor, paresthesias or weakness to suggest acute ischemia.  Patient started on antibiotics.  Not systemically ill at this time.  Admit for IV medications and specialty consultation.          Final Clinical Impression(s) / ED Diagnoses Final diagnoses:  Diabetic foot infection ([place])  Acute kidney injury [facility])    Rx / [place] Orders ED Discharge Orders     None         Carlisle Cater, PA-C 09/13/21 0155    Fatima Blank, MD 09/13/21 [phone]

## 2021-09-13 NOTE — Assessment & Plan Note (Signed)
-   We will pursue with IV antibiotic therapy at this time as mentioned above. ?- Salvage of this toe will be very challenging. ?

## 2021-09-13 NOTE — ED Notes (Signed)
Nasal 02 at 2 placed on the pt whenever he goes to sleep his sats drop below 88 percent ?

## 2021-09-13 NOTE — Assessment & Plan Note (Signed)
-   The patient will be placed on supplement coverage with NovoLog. ?- We will continue basal coverage. ?- We will continue glipizide and Jardiance. ?- We will continue Neurontin. ?

## 2021-09-13 NOTE — Assessment & Plan Note (Signed)
-   We will continue PPI therapy 

## 2021-09-13 NOTE — Progress Notes (Signed)
Pharmacy Antibiotic Note ? ?[name] is a 66 y.o. male admitted on 09/12/2021 with  cellulitis/wound infection .  Pharmacy has been consulted for Vancomycin/Zosyn dosing. WBC is elevated. Acute renal insufficiency.  ? ?3/11 AM Addendum:  ?Changing Zosyn to Cefepime ? ?Plan: ?Already on vancomycin  ?Start Cefepime 2g IV q12h ? ?Height: 6' (182.9 cm) ?Weight: 106.1 kg (233 lb 14.5 oz) ?IBW/kg (Calculated) : 77.6 ? ?Temp (24hrs), Avg:98 ?F (36.7 ?C), Min:97.9 ?F (36.6 ?C), Max:98 ?F (36.7 ?C) ? ?Recent Labs  ?Lab 09/10/21 ?9449 09/12/21 ?2309 09/13/21 ?6759  ?WBC  --  21.8*  --   ?CREATININE 1.20 1.85*  --   ?LATICACIDVEN  --   --  1.8  ? ?  ?Estimated Creatinine Clearance: 48.1 mL/min (A) (by C-G formula based on SCr of 1.85 mg/dL (H)).   ? ?Allergies  ?Allergen Reactions  ? Gemfibrozil Other (See Comments)  ?  Doesn't know its been awhile  ? Oxycontin [Oxycodone Hcl] Other (See Comments)  ?  Bad reaction "took me to a place I never wanna go again"  ? Simvastatin Other (See Comments)  ?  Says he doesn't know its been awhile  ? ? ?Narda Bonds, PharmD, BCPS ?Clinical Pharmacist ?Phone: [phone] ? ?

## 2021-09-13 NOTE — Assessment & Plan Note (Addendum)
-   The patient will be admitted to a medical bed. ?- We will continue antibiotic therapy with IV vancomycin and cefepime for severe nonpurulent cellulitis. ?- Vascular surgery consult by Dr. Virl Cagey and possibly podiatry consult can be called in AM. ?- Pain management will be provided. ?- Wound care consult will be obtained. ?- We will hold off aspirin and Plavix for now as well as NSAIDs. ?

## 2021-09-13 NOTE — ED Notes (Signed)
Wife updated on plan of pt care ?

## 2021-09-13 NOTE — Progress Notes (Addendum)
Pharmacy Antibiotic Note ? ?[name] is a 66 y.o. male admitted on 09/12/2021 with  cellulitis/wound infection .  Pharmacy has been consulted for Vancomycin/Zosyn dosing. WBC is elevated. Acute renal insufficiency.  ? ?Plan: ?Change in renal function overnight ?Adjusted vancomycin to 1750 mg q48h -- will continue to monitor and adjust as needed ?Continue Cefepime 2g IV q12h ?De-escalate when able ? ?Height: 6' (182.9 cm) ?Weight: 106.1 kg (233 lb 14.5 oz) ?IBW/kg (Calculated) : 77.6 ? ?Temp (24hrs), Avg:98 ?F (36.7 ?C), Min:97.9 ?F (36.6 ?C), Max:98 ?F (36.7 ?C) ? ?Recent Labs  ?Lab 09/10/21 ?0233 09/12/21 ?2309 09/13/21 ?4356 09/13/21 ?0210 09/13/21 ?0303  ?WBC  --  21.8*  --   --  20.1*  ?CREATININE 1.20 1.85*  --   --  2.14*  ?LATICACIDVEN  --   --  1.8 1.4  --   ? ?  ?Estimated Creatinine Clearance: 41.6 mL/min (A) (by C-G formula based on SCr of 2.14 mg/dL (H)).   ? ?Allergies  ?Allergen Reactions  ? Gemfibrozil Other (See Comments)  ?  Doesn't know its been awhile  ? Oxycontin [Oxycodone Hcl] Other (See Comments)  ?  Bad reaction "took me to a place I never wanna go again"  ? Simvastatin Other (See Comments)  ?  Says he doesn't know its been awhile  ? ? ?Lorelei Pont, PharmD, BCPS ?09/13/2021 2:14 PM ?ED Clinical Pharmacist -  [phone] ? ? ?

## 2021-09-13 NOTE — H&P (Addendum)
[place]   PATIENT NAME: [name]    MR#:  [id number]  DATE OF BIRTH:  1956/07/05  DATE OF ADMISSION:  09/12/2021  PRIMARY CARE PHYSICIAN: Harvie Junior, MD   Patient is coming from: Home  REQUESTING/REFERRING PHYSICIAN: Carlisle Cater, PA-C   CHIEF COMPLAINT:  Right foot infection  HISTORY OF PRESENT ILLNESS:  [name] is a 66 y.o. African-American male with medical history significant for type 2 diabetes mellitus, essential hypertension, peripheral neuropathy and obstructive sleep apnea, who presented to the emergency room with acute onset of worsening right foot redness that progressed to blistering on the dorsum of the right foot and red lymphatic streak extending to his leg as well as associated dark discoloration of the fourth toe.  He believes that this developed over the last 24 hours.  The patient denied any fever or chills.  No chest pain or dyspnea or cough or hemoptysis.  No nausea or vomiting or diarrhea or abdominal pain.  No dysuria, oliguria or hematuria or flank pain.  The patient has a poorly healing ulcer on the plantar side of the fourth metatarsal which is chronic without drainage.  The patient underwent right leg angiography and angioplasty by Dr. Unk Lightning on 3/8  ED Course: Upon presentation to the ER, BP was 98/61 with otherwise normal vital signs.  Labs revealed mild hyponatremia 132 and hypochloremia 96 with a BUN of 25 and creatinine 1.85 above previous levels with a calcium of 8.8 and albumin of 3 with total protein 8.  Lactic acid was 1.8.  CBC showed significantly cytosis 21.8 with thrombocytosis of 623.  Influenza antigens and COVID-19 PCR came back negative.  Blood glucose was 257.  2 blood cultures were drawn.    Imaging: Right foot x-ray showed no acute bony abnormality.  The patient was given IV vancomycin and Zosyn and 1 L bolus of IV lactated Ringer.  He will be admitted to a medical bed for further evaluation and  management.  PAST MEDICAL HISTORY:   Past Medical History:  Diagnosis Date   Diabetes mellitus without complication ([place])    Hypertension    Neuropathy    Sleep apnea     PAST SURGICAL HISTORY:   Past Surgical History:  Procedure Laterality Date   ABDOMINAL AORTOGRAM W/LOWER EXTREMITY Right 09/10/2021   Procedure: ABDOMINAL AORTOGRAM W/LOWER EXTREMITY;  Surgeon: [name], MD;  Location: [facility];  Service: Cardiovascular;  Laterality: Right;   ABDOMINAL HERNIA REPAIR     BACK SURGERY     KNEE SURGERY     PERIPHERAL VASCULAR BALLOON ANGIOPLASTY  09/10/2021   Procedure: PERIPHERAL VASCULAR BALLOON ANGIOPLASTY;  Surgeon: [name], MD;  Location: [facility];  Service: Cardiovascular;;  rt sfa pta   SHOULDER SURGERY      SOCIAL HISTORY:   Social History   Tobacco Use   Smoking status: Former    Types: Cigarettes   Smokeless tobacco: Never  Substance Use Topics   Alcohol use: No    FAMILY HISTORY:   Family History  Problem Relation Age of Onset   Stroke Mother    Coronary artery disease Father    COPD Brother     DRUG ALLERGIES:   Allergies  Allergen Reactions   Gemfibrozil Other (See Comments)    Doesn't know its been awhile   Oxycontin [Oxycodone Hcl] Other (See Comments)    Bad reaction "took me to a place I never wanna  go again"   Simvastatin Other (See Comments)    Says he doesn't know its been awhile    REVIEW OF SYSTEMS:   ROS As per history of present illness. All pertinent systems were reviewed above. Constitutional, HEENT, cardiovascular, respiratory, GI, GU, musculoskeletal, neuro, psychiatric, endocrine, integumentary and hematologic systems were reviewed and are otherwise negative/unremarkable except for positive findings mentioned above in the HPI.   MEDICATIONS AT HOME:   Prior to Admission medications   Medication Sig Start Date End Date Taking? Authorizing Provider  acetaminophen (TYLENOL) 500 MG tablet Take  500 mg by mouth every 8 (eight) hours as needed for moderate pain.    [provider]  albuterol (VENTOLIN HFA) 108 (90 Base) MCG/ACT inhaler Inhale 2 puffs into the lungs every 6 (six) hours as needed for wheezing or shortness of breath. 02/11/21   Chesley Mires, MD  aspirin EC 81 MG tablet Take 1 tablet (81 mg total) by mouth daily. Swallow whole. 09/10/21 09/10/22  [name], MD  atorvastatin (LIPITOR) 80 MG tablet Take 80 mg by mouth daily.    [provider]  buPROPion (WELLBUTRIN XL) 300 MG 24 hr tablet Take 300 mg by mouth daily.    [provider]  carboxymethylcellulose (REFRESH PLUS) 0.5 % SOLN Place 1 drop into both eyes 4 (four) times daily as needed (dry eyes).    [provider]  Cholecalciferol (VITAMIN D3) 2000 UNITS TABS Take 2,000 Units by mouth 2 (two) times daily after a meal.    [provider]  clopidogrel (PLAVIX) 75 MG tablet Take 1 tablet (75 mg total) by mouth daily. 09/10/21 09/10/22  [name], MD  cyclobenzaprine (FLEXERIL) 10 MG tablet Take 1 tablet (10 mg total) by mouth 2 (two) times daily as needed for muscle spasms. 07/25/20   Lucrezia Starch, MD  diclofenac (VOLTAREN) 75 MG EC tablet Take 75 mg by mouth 2 (two) times daily. 02/21/21   [provider]  donepezil (ARICEPT) 5 MG tablet Take 5 mg by mouth daily. 02/21/21   [provider]  empagliflozin (JARDIANCE) 25 MG TABS tablet Take 25 mg by mouth daily.    [provider]  fluticasone furoate-vilanterol (BREO ELLIPTA) 100-25 MCG/INH AEPB Inhale 1 puff into the lungs daily. 02/11/21   Chesley Mires, MD  gabapentin (NEURONTIN) 600 MG tablet Take 600 mg by mouth 3 (three) times daily. 02/21/21   [provider]  GLIPIZIDE PO Take 1 tablet by mouth 2 (two) times daily.    [provider]  ibuprofen (ADVIL,MOTRIN) 600 MG tablet Take 600 mg by mouth every 6 (six) hours as needed for pain.    [provider]  insulin aspart  protamine- aspart (NOVOLOG MIX 70/30) (70-30) 100 UNIT/ML injection Inject 80 Units into the skin 2 (two) times daily with a meal.    [provider]  lisinopril (ZESTRIL) 40 MG tablet Take 40 mg by mouth daily.    [provider]  meclizine (ANTIVERT) 25 MG tablet Take 1 tablet (25 mg total) by mouth 3 (three) times daily as needed for dizziness. 04/05/21   Dorie Rank, MD  metFORMIN (GLUCOPHAGE) 1000 MG tablet Take 1,000 mg by mouth 2 (two) times daily with a meal.    [provider]  methocarbamol (ROBAXIN) 500 MG tablet Take 1 tablet (500 mg total) by mouth 4 (four) times daily. 05/17/15   Carlisle Cater, PA-C  metoprolol tartrate (LOPRESSOR) 50 MG tablet Take 50 mg by mouth 2 (  two) times daily.    [provider]  OLANZapine (ZYPREXA) 2.5 MG tablet Take 2.5 mg by mouth at bedtime. 03/06/21   [provider]  olmesartan-hydrochlorothiazide (BENICAR HCT) 40-12.5 MG tablet Take 1 tablet by mouth daily. 08/08/21   [provider]  omeprazole (PRILOSEC) 20 MG capsule Take 20 mg by mouth daily.    [provider]  ondansetron (ZOFRAN ODT) 8 MG disintegrating tablet Take 1 tablet (8 mg total) by mouth every 8 (eight) hours as needed for nausea or vomiting. Patient not taking: Reported on 09/05/2021 04/05/21   Dorie Rank, MD  OXcarbazepine (TRILEPTAL) 150 MG tablet Take 150 mg by mouth 2 (two) times daily.    [provider]  oxyCODONE (ROXICODONE) 15 MG immediate release tablet Take 15 mg by mouth 4 (four) times daily as needed for pain. 03/21/21   [provider]  Semaglutide, 1 MG/DOSE, (OZEMPIC, 1 MG/DOSE,) 2 MG/1.5ML SOPN Inject 1 mg into the skin every Sunday.    [provider]  sildenafil (VIAGRA) 100 MG tablet Take 100 mg by mouth daily as needed for erectile dysfunction.    [provider]  traMADol (ULTRAM) 50 MG tablet Take 1 tablet (50 mg total) by mouth every 6 (six) hours as needed. 09/17/16   Hedges,  Dellis Filbert, PA-C      VITAL SIGNS:  Blood pressure (!) 113/58, pulse 66, temperature 97.9 F (36.6 C), temperature source Oral, resp. rate 16, height 6' (1.829 m), weight 106.1 kg, SpO2 (!) 88 %.  PHYSICAL EXAMINATION:  Physical Exam  GENERAL:  66 y.o.-year-old African-American male patient lying in the bed with no acute distress.  EYES: Pupils equal, round, reactive to light and accommodation. No scleral icterus. Extraocular muscles intact.  HEENT: Head atraumatic, normocephalic. Oropharynx and nasopharynx clear.  NECK:  Supple, no jugular venous distention. No thyroid enlargement, no tenderness.  LUNGS: Normal breath sounds bilaterally, no wheezing, rales,rhonchi or crepitation. No use of accessory muscles of respiration.  CARDIOVASCULAR: Regular rate and rhythm, S1, S2 normal. No murmurs, rubs, or gallops.  ABDOMEN: Soft, nondistended, nontender. Bowel sounds present. No organomegaly or mass.  EXTREMITIES: No pedal edema, cyanosis, or clubbing.  NEUROLOGIC: Cranial nerves II through XII are intact. Muscle strength 5/5 in all extremities. Sensation intact. Gait not checked.  PSYCHIATRIC: The patient is alert and oriented x 3.  Normal affect and good eye contact. SKIN: Right dorsal foot erythema with associated black discolored bulla at the base of fourth metatarsal and black discoloration of the fourth toe.  He has plantar foot ulcer at the base of the fourth metatarsal with black eschar at the base.        LABORATORY PANEL:   CBC Recent Labs  Lab 09/13/21 0303  WBC 20.1*  HGB 12.4*  HCT 37.3*  PLT 490*   ------------------------------------------------------------------------------------------------------------------  Chemistries  Recent Labs  Lab 09/12/21 2309 09/13/21 0303  NA 132* 132*  K 4.0 3.8  CL 96* 97*  CO2 21* 21*  GLUCOSE 257* 200*  BUN 25* 27*  CREATININE 1.85* 2.14*  CALCIUM 8.8* 8.4*  AST 27  --   ALT 32  --   ALKPHOS 83  --   BILITOT 0.6  --     ------------------------------------------------------------------------------------------------------------------  Cardiac Enzymes No results for input(s): TROPONINI in the last 168 hours. ------------------------------------------------------------------------------------------------------------------  RADIOLOGY:  DG Foot Complete Right  Result Date: 09/13/2021 CLINICAL DATA:  Infection.  Redness, swelling along dorsum of foot. EXAM: RIGHT FOOT COMPLETE - 3+ VIEW COMPARISON:  None. FINDINGS: Soft tissue swelling in the midfoot/forefoot region. No acute bony abnormality. Specifically, no fracture, subluxation, or dislocation. Joint spaces maintained. No bone destruction to suggest osteomyelitis. No radiopaque foreign bodies. IMPRESSION: No acute bony abnormality. Electronically Signed   By: Rolm Baptise M.D.   On: 09/13/2021 01:18      IMPRESSION AND PLAN:  Assessment and Plan: * Cellulitis in diabetic foot ([place]) - The patient will be admitted to a medical bed. - We will continue antibiotic therapy with IV vancomycin and cefepime for severe nonpurulent cellulitis. - Vascular surgery consult by Dr. Virl Cagey and possibly podiatry consult can be called in AM. - Pain management will be provided. - Wound care consult will be obtained. - We will hold off aspirin and Plavix for now as well as NSAIDs.  AKI (acute kidney injury) ([place]) - The patient will be placed on hydration with IV normal saline. - We will hold off nephrotoxins. - We will follow BMP.  Type 2 diabetes mellitus with peripheral neuropathy ([facility]) - The patient will be placed on supplement coverage with NovoLog. - We will continue basal coverage. - We will continue glipizide and Jardiance. - We will continue Neurontin.  Gangrene of toe of right foot ([place]) - We will pursue with IV antibiotic therapy at this time as mentioned above. - Salvage of this toe will be very challenging.  Essential hypertension - We will hold off  Zestril and Benicar HCT given acute kidney injury. - We will continue per blocker therapy with Lopressor. - The patient will be placed on as needed IV labetalol.  Dyslipidemia We will continue statin therapy.  Depression - We will continue Wellbutrin XL.  GERD without esophagitis - We will continue PPI therapy.  Obstructive sleep apnea - He will be monitored while he is here.  He has not tolerated CPAP in the past. - If needed here retrial can be performed.    DVT prophylaxis: Lovenox. Advanced Care Planning:  Code Status: full code. Family Communication:  The plan of care was discussed in details with the patient (and family). I answered all questions. The patient agreed to proceed with the above mentioned plan. Further management will depend upon hospital course. Disposition Plan: Back to previous home environment Consults called: Vascular surgery. All the records are reviewed and case discussed with ED provider.  Status is: Inpatient  At the time of the admission, it appears that the appropriate admission status for this patient is inpatient.  This is judged to be reasonable and necessary in order to provide the required intensity of service to ensure the patient's safety given the presenting symptoms, physical exam findings and initial radiographic and laboratory data in the context of comorbid conditions.  The patient requires inpatient status due to high intensity of service, high risk of further deterioration and high frequency of surveillance required.  I certify that at the time of admission, it is my clinical judgment that the patient will require inpatient hospital care extending more than 2 midnights.                            Dispo: The patient is from: Home              Anticipated d/c is to: Home              Patient currently is not medically stable to d/c.  Difficult to place patient: No  Christel Mormon M.D on 09/13/2021 at 4:43 AM  [facility]    From 7 PM-7 AM, contact night-coverage [email or web address]  CC: Primary care physician; Harvie Junior, MD

## 2021-09-13 NOTE — Consult Note (Signed)
Hospital Consult    Reason for Consult: Gangrenous changes right foot Referring Physician: Dr. Sloan Leiter MRN #:  [id number]  History of Present Illness: This is a 66 y.o. male with history of peripheral vascular disease with chronic limb threatening ischemia and Rutherford 5 tissue loss.  He has recently undergone angiography with drug-coated balloon angioplasty of the right SFA.  He has been considered for retrograde posterior tibial access versus bypass.  He now presents with worsening wound and pain of the right foot.  He has been started on broad-spectrum antibiotics.  He also has associated acute kidney injury.  He has been compliant with aspirin, Plavix and a statin.  Past Medical History:  Diagnosis Date   Diabetes mellitus without complication ([place])    Hypertension    Neuropathy    Sleep apnea     Past Surgical History:  Procedure Laterality Date   ABDOMINAL AORTOGRAM W/LOWER EXTREMITY Right 09/10/2021   Procedure: ABDOMINAL AORTOGRAM W/LOWER EXTREMITY;  Surgeon: [name], MD;  Location: [facility];  Service: Cardiovascular;  Laterality: Right;   ABDOMINAL HERNIA REPAIR     BACK SURGERY     KNEE SURGERY     PERIPHERAL VASCULAR BALLOON ANGIOPLASTY  09/10/2021   Procedure: PERIPHERAL VASCULAR BALLOON ANGIOPLASTY;  Surgeon: [name], MD;  Location: [facility];  Service: Cardiovascular;;  rt sfa pta   SHOULDER SURGERY      Allergies  Allergen Reactions   Gemfibrozil Other (See Comments)    Doesn't know its been awhile   Oxycontin [Oxycodone Hcl] Other (See Comments)    Bad reaction "took me to a place I never wanna go again"   Simvastatin Other (See Comments)    Says he doesn't know its been awhile    Prior to Admission medications   Medication Sig Start Date End Date Taking? Authorizing Provider  acetaminophen (TYLENOL) 500 MG tablet Take 500 mg by mouth every 8 (eight) hours as needed for moderate pain.    [provider]  albuterol  (VENTOLIN HFA) 108 (90 Base) MCG/ACT inhaler Inhale 2 puffs into the lungs every 6 (six) hours as needed for wheezing or shortness of breath. 02/11/21   Chesley Mires, MD  aspirin EC 81 MG tablet Take 1 tablet (81 mg total) by mouth daily. Swallow whole. 09/10/21 09/10/22  [name], MD  atorvastatin (LIPITOR) 80 MG tablet Take 80 mg by mouth daily.    [provider]  buPROPion (WELLBUTRIN XL) 300 MG 24 hr tablet Take 300 mg by mouth daily.    [provider]  carboxymethylcellulose (REFRESH PLUS) 0.5 % SOLN Place 1 drop into both eyes 4 (four) times daily as needed (dry eyes).    [provider]  Cholecalciferol (VITAMIN D3) 2000 UNITS TABS Take 2,000 Units by mouth 2 (two) times daily after a meal.    [provider]  clopidogrel (PLAVIX) 75 MG tablet Take 1 tablet (75 mg total) by mouth daily. 09/10/21 09/10/22  [name], MD  cyclobenzaprine (FLEXERIL) 10 MG tablet Take 1 tablet (10 mg total) by mouth 2 (two) times daily as needed for muscle spasms. 07/25/20   Lucrezia Starch, MD  diclofenac (VOLTAREN) 75 MG EC tablet Take 75 mg by mouth 2 (two) times daily. 02/21/21   [provider]  donepezil (ARICEPT) 5 MG tablet Take 5 mg by mouth daily. 02/21/21   [provider]  empagliflozin (JARDIANCE) 25 MG TABS tablet Take 25 mg by mouth  daily.    [provider]  fluticasone furoate-vilanterol (BREO ELLIPTA) 100-25 MCG/INH AEPB Inhale 1 puff into the lungs daily. 02/11/21   Chesley Mires, MD  gabapentin (NEURONTIN) 600 MG tablet Take 600 mg by mouth 3 (three) times daily. 02/21/21   [provider]  GLIPIZIDE PO Take 1 tablet by mouth 2 (two) times daily.    [provider]  ibuprofen (ADVIL,MOTRIN) 600 MG tablet Take 600 mg by mouth every 6 (six) hours as needed for pain.    [provider]  insulin aspart protamine- aspart (NOVOLOG MIX 70/30) (70-30) 100 UNIT/ML injection Inject 80 Units into the skin 2 (two)  times daily with a meal.    [provider]  lisinopril (ZESTRIL) 40 MG tablet Take 40 mg by mouth daily.    [provider]  meclizine (ANTIVERT) 25 MG tablet Take 1 tablet (25 mg total) by mouth 3 (three) times daily as needed for dizziness. 04/05/21   Dorie Rank, MD  metFORMIN (GLUCOPHAGE) 1000 MG tablet Take 1,000 mg by mouth 2 (two) times daily with a meal.    [provider]  methocarbamol (ROBAXIN) 500 MG tablet Take 1 tablet (500 mg total) by mouth 4 (four) times daily. 05/17/15   Carlisle Cater, PA-C  metoprolol tartrate (LOPRESSOR) 50 MG tablet Take 50 mg by mouth 2 (two) times daily.    [provider]  OLANZapine (ZYPREXA) 2.5 MG tablet Take 2.5 mg by mouth at bedtime. 03/06/21   [provider]  olmesartan-hydrochlorothiazide (BENICAR HCT) 40-12.5 MG tablet Take 1 tablet by mouth daily. 08/08/21   [provider]  omeprazole (PRILOSEC) 20 MG capsule Take 20 mg by mouth daily.    [provider]  ondansetron (ZOFRAN ODT) 8 MG disintegrating tablet Take 1 tablet (8 mg total) by mouth every 8 (eight) hours as needed for nausea or vomiting. Patient not taking: Reported on 09/05/2021 04/05/21   Dorie Rank, MD  OXcarbazepine (TRILEPTAL) 150 MG tablet Take 150 mg by mouth 2 (two) times daily.    [provider]  oxyCODONE (ROXICODONE) 15 MG immediate release tablet Take 15 mg by mouth 4 (four) times daily as needed for pain. 03/21/21   [provider]  Semaglutide, 1 MG/DOSE, (OZEMPIC, 1 MG/DOSE,) 2 MG/1.5ML SOPN Inject 1 mg into the skin every Sunday.    [provider]  sildenafil (VIAGRA) 100 MG tablet Take 100 mg by mouth daily as needed for erectile dysfunction.    [provider]  traMADol (ULTRAM) 50 MG tablet Take 1 tablet (50 mg total) by mouth every 6 (six) hours as needed. 09/17/16   Okey Regal, PA-C    Social History   Socioeconomic History   Marital status: Married    Spouse name:  Not on file   Number of children: Not on file   Years of education: Not on file   Highest education level: Not on file  Occupational History   Not on file  Tobacco Use   Smoking status: Former    Types: Cigarettes   Smokeless tobacco: Never  Substance and Sexual Activity   Alcohol use: No   Drug use: Never   Sexual activity: Not on file  Other Topics Concern   Not on file  Social History Narrative   Not on file   Social Determinants of Health   Financial Resource Strain: Not on file  Food Insecurity: Not on file  Transportation Needs: Not on file  Physical Activity: Not on file  Stress: Not on file  Social Connections: Not on file  Intimate Partner Violence: Not on file     Family History  Problem Relation Age of Onset   Stroke Mother    Coronary artery disease Father    COPD Brother     ROS: Cardiovascular: '[]'$  chest pain/pressure '[]'$  palpitations '[]'$  SOB lying flat '[]'$  DOE '[]'$  pain in legs while walking '[]'$  pain in legs at rest '[]'$  pain in legs at night '[]'$  non-healing ulcers '[]'$  hx of DVT '[x]'$  swelling in legs  Pulmonary: '[]'$  productive cough '[]'$  asthma/wheezing '[]'$  home O2  Neurologic: '[]'$  weakness in '[]'$  arms '[]'$  legs '[]'$  numbness in '[]'$  arms '[]'$  legs '[]'$  hx of CVA '[]'$  mini stroke '[]'$ difficulty speaking or slurred speech '[]'$  temporary loss of vision in one eye '[]'$  dizziness  Hematologic: '[]'$  hx of cancer '[]'$  bleeding problems '[]'$  problems with blood clotting easily  Endocrine:   '[]'$ x diabetes '[]'$  thyroid disease  GI '[]'$  vomiting blood '[]'$  blood in stool  GU: '[]'$  CKD/renal failure '[]'$  HD--'[]'$  M/W/F or '[]'$  T/T/S '[]'$  burning with urination '[]'$  blood in urine  Psychiatric: '[]'$  anxiety '[]'$  depression  Musculoskeletal: '[]'$  arthritis '[]'$  joint pain  Integumentary: '[x]'$  wound '[]'$  ulcers  Constitutional: '[]'$  fever '[]'$  chills   Physical Examination  Vitals:   09/13/21 1400 09/13/21 1458  BP: 139/62 (!) 145/81  Pulse: 76 87  Resp: (!) 22 16  Temp:  98 F (36.7 C)   SpO2: 95% 98%   Body mass index is 31.72 kg/m.  Physical Exam HENT:     Head: Normocephalic.     Nose: Nose normal.  Cardiovascular:     Rate and Rhythm: Normal rate.     Pulses:          Radial pulses are 2+ on the right side and 2+ on the left side.       Femoral pulses are 2+ on the right side and 2+ on the left side.      Popliteal pulses are 2+ on the right side.       Dorsalis pedis pulses are 0 on the right side and 0 on the left side.       Posterior tibial pulses are 0 on the right side and 0 on the left side.  Abdominal:     General: Abdomen is flat.     Palpations: Abdomen is soft.  Musculoskeletal:     Cervical back: Neck supple.     Right lower leg: No edema.     Left lower leg: No edema.  Skin:    Comments: As pictured below  Neurological:     Mental Status: He is alert.        CBC    Component Value Date/Time   WBC 20.1 (H) 09/13/2021 0303   RBC 4.34 09/13/2021 0303   HGB 12.4 (L) 09/13/2021 0303   HCT 37.3 (L) 09/13/2021 0303   PLT 490 (H) 09/13/2021 0303   MCV 85.9 09/13/2021 0303   MCH 28.6 09/13/2021 0303   MCHC 33.2 09/13/2021 0303   RDW 13.8 09/13/2021 0303   LYMPHSABS 2.3 08/28/2021 0813   MONOABS 0.8 08/28/2021 0813   EOSABS 0.5 08/28/2021 0813   BASOSABS 0.1 08/28/2021 0813    BMET    Component Value Date/Time   NA 132 (L) 09/13/2021 0303   K 3.8 09/13/2021 0303   CL 97 (L) 09/13/2021 0303   CO2 21 (L) 09/13/2021 0303   GLUCOSE 200 (H) 09/13/2021 0303  BUN 27 (H) 09/13/2021 0303   CREATININE 2.14 (H) 09/13/2021 0303   CALCIUM 8.4 (L) 09/13/2021 0303   GFRNONAA 33 (L) 09/13/2021 0303   GFRAA >60 02/17/2015 2030    COAGS: No results found for: INR, PROTIME   Non-Invasive Vascular Imaging:   ABIs and vein mapping pending  ASSESSMENT/PLAN: This is a 66 y.o. male with history of chronic limb threatening ischemia with gangrenous changes of the right foot.  Patient will need the right lower extremity femoral to posterior  tibial bypass or retrograde posterior tibial access for revascularization.  I discussed with the patient the plan and he demonstrates good understanding and I will discuss with his primary surgeon.  [name] C. Donzetta Matters, MD [facility] Office: [phone] Pager: [phone]

## 2021-09-13 NOTE — Assessment & Plan Note (Signed)
-   We will hold off Zestril and Benicar HCT given acute kidney injury. ?- We will continue per blocker therapy with Lopressor. ?- The patient will be placed on as needed IV labetalol. ?

## 2021-09-13 NOTE — Progress Notes (Signed)
PROGRESS NOTE    [name] KUENZI  LPN:[id number] DOB: 1955-09-11 DOA: 09/12/2021 PCP: Harvie Junior, MD    Brief Narrative:  66 year old gentleman with history of type 2 diabetes on insulin, essential hypertension, peripheral neuropathy and obstructive sleep apnea presented with right foot redness progressed to blistering on the dorsum of the right foot and associated spreading inflammation rapidly progressing within 24 hours.  Patient was seen by vascular surgery as outpatient and underwent right leg angiography and angioplasty by Dr. Unk Lightning on 3/8 for same.  In the emergency room patient with evidence of acute kidney injury, leukocytosis with WBC count 21.8 and thrombocytosis.  Blood cultures drawn.  Started on broad-spectrum antibiotics and admitted to the hospital. Patient was already scheduled to have surgery/ Bypass ? next week.   Assessment & Plan:   Cellulitis abscess of the right foot, diabetic foot infection, sepsis present on admission with leukocytosis, tachycardia and acute renal failure: Patient is admitted to the hospital.  Blood cultures were drawn.  Patient was started on IV vancomycin and cefepime for severe nonpurulent cellulitis. We will be seen by vascular surgery.  Patient will probably need amputation to clean margin.  Surgery to decide. Angiogram and angioplasty 3/8 with severe peripheral vascular disease, balloon angioplasty superficial femoral artery.  Rutherford 5 critical limb ischemia on the right, rather poor for critical ischemia on the left.  Patient will probably need below-knee amputation.  Acute kidney injury: Recent known normal renal function 1.08-1.2.  Creatinine 2.14 today.  Consistent with ATN from sepsis.  Aggressive IV fluid resuscitation, intake output monitoring.  Essential hypertension: At risk of hypotension.  Holding off ACE inhibitor and hydrochlorothiazide given acute kidney injury.  Continue beta-blockers.  As needed IV labetalol.  Type  2 diabetes, uncontrolled with hyperglycemia.  Complicated with peripheral neuropathy: Started on basal and sliding scale insulin.  Continue glipizide.  Continue Neurontin.  Obstructive sleep apnea: Not using CPAP at home as he could not tolerate that.  GERD: On PPI.  Continue.  Called and discussed with vascular surgery.   DVT prophylaxis: enoxaparin (LOVENOX) injection 40 mg Start: 09/13/21 1000   Code Status: Full code Family Communication: None at the bedside. Disposition Plan: Status is: Inpatient Remains inpatient appropriate because: Severe leg infection.  Ischemia.     Consultants:  Vascular surgery  Procedures:  None  Antimicrobials:  Vancomycin and cefepime 3/10----   Subjective: Patient seen and examined.  He was still in the emergency room.  When I talked to him in the morning rounds, he was very focused on type of surgery he will be needing. Patient was following with vascular surgery as outpatient. He was very worried whether he will need amputation of the right leg, multiple questions I tried to answer. I encouraged him to keep up conversation with vascular surgery about definitive treatment for his poor vasculature.  Objective: Vitals:   09/13/21 0745 09/13/21 1030 09/13/21 1200 09/13/21 1400  BP: 124/61 (!) 143/68 (!) 149/70 139/62  Pulse: 71 78 82 76  Resp: '15 14 16 '$ (!) 22  Temp:      TempSrc:      SpO2: 98% 97% 97% 95%  Weight:      Height:        Intake/Output Summary (Last 24 hours) at 09/13/2021 1425 Last data filed at 09/13/2021 0407 Gross per 24 hour  Intake 1000 ml  Output --  Net 1000 ml   Filed Weights   09/12/21 2256  Weight: 106.1 kg  Examination:  General exam: Appears calm and comfortable  Patient is anxious.  Not in any distress.  On room air. Respiratory system: Clear to auscultation. Respiratory effort normal. Cardiovascular system: S1 & S2 heard, RRR. No JVD, murmurs, rubs, gallops or clicks. . Gastrointestinal  system: Abdomen is nondistended, soft and nontender. No organomegaly or masses felt. Normal bowel sounds heard. Central nervous system: Alert and oriented. No focal neurological deficits. Extremities:  Right lower extremity exam, pictures in the chart. Chronic ischemic changes.  Gangrene bullae fourth toe.  Data Reviewed: I have personally reviewed following labs and imaging studies  CBC: Recent Labs  Lab 09/10/21 0813 09/12/21 2309 09/13/21 0303  WBC  --  21.8* 20.1*  HGB 14.6 13.1 12.4*  HCT 43.0 40.3 37.3*  MCV  --  85.7 85.9  PLT  --  623* 893*   Basic Metabolic Panel: Recent Labs  Lab 09/10/21 0813 09/12/21 2309 09/13/21 0303  NA 137 132* 132*  K 3.5 4.0 3.8  CL 101 96* 97*  CO2  --  21* 21*  GLUCOSE 104* 257* 200*  BUN 23 25* 27*  CREATININE 1.20 1.85* 2.14*  CALCIUM  --  8.8* 8.4*   GFR: Estimated Creatinine Clearance: 41.6 mL/min (A) (by C-G formula based on SCr of 2.14 mg/dL (H)). Liver Function Tests: Recent Labs  Lab 09/12/21 2309  AST 27  ALT 32  ALKPHOS 83  BILITOT 0.6  PROT 8.0  ALBUMIN 3.0*   No results for input(s): LIPASE, AMYLASE in the last 168 hours. No results for input(s): AMMONIA in the last 168 hours. Coagulation Profile: No results for input(s): INR, PROTIME in the last 168 hours. Cardiac Enzymes: No results for input(s): CKTOTAL, CKMB, CKMBINDEX, TROPONINI in the last 168 hours. BNP (last 3 results) No results for input(s): PROBNP in the last 8760 hours. HbA1C: No results for input(s): HGBA1C in the last 72 hours. CBG: Recent Labs  Lab 09/10/21 0740 09/10/21 1239 09/12/21 2259 09/13/21 0742 09/13/21 1135  GLUCAP 103* 79 252* 148* 114*   Lipid Profile: No results for input(s): CHOL, HDL, LDLCALC, TRIG, CHOLHDL, LDLDIRECT in the last 72 hours. Thyroid Function Tests: No results for input(s): TSH, T4TOTAL, FREET4, T3FREE, THYROIDAB in the last 72 hours. Anemia Panel: No results for input(s): VITAMINB12, FOLATE, FERRITIN,  TIBC, IRON, RETICCTPCT in the last 72 hours. Sepsis Labs: Recent Labs  Lab 09/13/21 0053 09/13/21 0210  LATICACIDVEN 1.8 1.4    Recent Results (from the past 240 hour(s))  Resp Panel by RT-PCR (Flu A&B, Covid)     Status: None   Collection Time: 09/13/21 12:29 AM   Specimen: Nasopharyngeal(NP) swabs in vial transport medium  Result Value Ref Range Status   SARS Coronavirus 2 by RT PCR NEGATIVE NEGATIVE Final    Comment: (NOTE) SARS-CoV-2 target nucleic acids are NOT DETECTED.  The SARS-CoV-2 RNA is generally detectable in upper respiratory specimens during the acute phase of infection. The lowest concentration of SARS-CoV-2 viral copies this assay can detect is 138 copies/mL. A negative result does not preclude SARS-Cov-2 infection and should not be used as the sole basis for treatment or other patient management decisions. A negative result may occur with  improper specimen collection/handling, submission of specimen other than nasopharyngeal swab, presence of viral mutation(s) within the areas targeted by this assay, and inadequate number of viral copies(<138 copies/mL). A negative result must be combined with clinical observations, patient history, and epidemiological information. The expected result is Negative.  Fact Sheet for Patients:  EntrepreneurPulse.com.au  Fact Sheet for Healthcare Providers:  IncredibleEmployment.be  This test is no t yet approved or cleared by the [place] FDA and  has been authorized for detection and/or diagnosis of SARS-CoV-2 by FDA under an Emergency Use Authorization (EUA). This EUA will remain  in effect (meaning this test can be used) for the duration of the COVID-19 declaration under Section 564(b)(1) of the Act, 21 U.S.C.section 360bbb-3(b)(1), unless the authorization is terminated  or revoked sooner.       Influenza A by PCR NEGATIVE NEGATIVE Final   Influenza B by PCR NEGATIVE NEGATIVE  Final    Comment: (NOTE) The Xpert Xpress SARS-CoV-2/FLU/RSV plus assay is intended as an aid in the diagnosis of influenza from Nasopharyngeal swab specimens and should not be used as a sole basis for treatment. Nasal washings and aspirates are unacceptable for Xpert Xpress SARS-CoV-2/FLU/RSV testing.  Fact Sheet for Patients: EntrepreneurPulse.com.au  Fact Sheet for Healthcare Providers: IncredibleEmployment.be  This test is not yet approved or cleared by the [place] FDA and has been authorized for detection and/or diagnosis of SARS-CoV-2 by FDA under an Emergency Use Authorization (EUA). This EUA will remain in effect (meaning this test can be used) for the duration of the COVID-19 declaration under Section 564(b)(1) of the Act, 21 U.S.C. section 360bbb-3(b)(1), unless the authorization is terminated or revoked.  Performed at [facility], [place] [address] [place], [place] [address]          Radiology Studies: DG Foot Complete Right  Result Date: 09/13/2021 CLINICAL DATA:  Infection.  Redness, swelling along dorsum of foot. EXAM: RIGHT FOOT COMPLETE - 3+ VIEW COMPARISON:  None. FINDINGS: Soft tissue swelling in the midfoot/forefoot region. No acute bony abnormality. Specifically, no fracture, subluxation, or dislocation. Joint spaces maintained. No bone destruction to suggest osteomyelitis. No radiopaque foreign bodies. IMPRESSION: No acute bony abnormality. Electronically Signed   By: Rolm Baptise M.D.   On: 09/13/2021 01:18        Scheduled Meds:  atorvastatin  80 mg Oral Daily   buPROPion  300 mg Oral Daily   cholecalciferol  2,000 Units Oral BID   donepezil  5 mg Oral Daily   enoxaparin (LOVENOX) injection  40 mg Subcutaneous Daily   fluticasone furoate-vilanterol  1 puff Inhalation Daily   gabapentin  600 mg Oral TID   glipiZIDE  2.5 mg Oral Q breakfast   insulin aspart  0-15 Units Subcutaneous TID AC & HS    methocarbamol  500 mg Oral TID AC & HS   metoprolol tartrate  50 mg Oral BID   OLANZapine  2.5 mg Oral QHS   OXcarbazepine  150 mg Oral BID   pantoprazole  40 mg Oral Daily   Continuous Infusions:  sodium chloride 100 mL/hr at 09/13/21 1326   ceFEPime (MAXIPIME) IV Stopped (09/13/21 0910)   metronidazole Stopped (09/13/21 0910)   [START ON 09/15/2021] vancomycin       LOS: 0 days    Time spent: Additional 30 minutes.    Barb Merino, MD [facility] Pager [phone]

## 2021-09-13 NOTE — Progress Notes (Signed)
Pharmacy Antibiotic Note ? ?[name] is a 66 y.o. male admitted on 09/12/2021 with  cellulitis/wound infection .  Pharmacy has been consulted for Vancomycin/Zosyn dosing. WBC is elevated. Acute renal insufficiency.  ? ?Plan: ?Vancomycin 2000 mg IV x 1, then 1000 mg IV q24h (increase dose if Scr improves) ?>>>Estimated AUC: 481 ?Zosyn 3.375G IV q8h to be infused over 4 hours ?Trend WBC, temp, renal function  ?F/U infectious work-up ?Drug levels as indicated ? ? ?Height: 6' (182.9 cm) ?Weight: 106.1 kg (233 lb 14.5 oz) ?IBW/kg (Calculated) : 77.6 ? ?Temp (24hrs), Avg:98 ?F (36.7 ?C), Min:97.9 ?F (36.6 ?C), Max:98 ?F (36.7 ?C) ? ?Recent Labs  ?Lab 09/10/21 ?4585 09/12/21 ?2309  ?WBC  --  21.8*  ?CREATININE 1.20 1.85*  ?  ?Estimated Creatinine Clearance: 48.1 mL/min (A) (by C-G formula based on SCr of 1.85 mg/dL (H)).   ? ?Allergies  ?Allergen Reactions  ? Gemfibrozil Other (See Comments)  ?  Doesn't know its been awhile  ? Oxycontin [Oxycodone Hcl] Other (See Comments)  ?  Bad reaction "took me to a place I never wanna go again"  ? Simvastatin Other (See Comments)  ?  Says he doesn't know its been awhile  ? ? ?Narda Bonds, PharmD, BCPS ?Clinical Pharmacist ?Phone: [phone] ? ?

## 2021-09-13 NOTE — TOC Initial Note (Signed)
Transition of Care (TOC) - Initial/Assessment Note  ? ? ?Patient Details  ?Name: [name] ?MRN: [id number] ?Date of Birth: 16-Nov-1955 ? ?Transition of Care (TOC) CM/SW Contact:    ?Verdell Carmine, RN ?Phone Number: ?09/13/2021, 11:34 AM ? ?Clinical Narrative:                 ? ?Transition of Care Department [facility]) has reviewed patient and no TOC needs have been identified at this time. We will continue to monitor patient advancement through interdisciplinary progression rounds. If new patient transition needs arise, please place a TOC consult. ?  ?  ?  ?  ? ? ?Patient Goals and CMS Choice ?  ?  ?  ? ?Expected Discharge Plan and Services ?  ?  ?  ?  ?  ?                ?  ?  ?  ?  ?  ?  ?  ?  ?  ?  ? ?Prior Living Arrangements/Services ?  ?  ?  ?       ?  ?  ?  ?  ? ?Activities of Daily Living ?  ?  ? ?Permission Sought/Granted ?  ?  ?   ?   ?   ?   ? ?Emotional Assessment ?  ?  ?  ?  ?  ?  ? ?Admission diagnosis:  Cellulitis in diabetic foot ([place]) [N05.397, Q73.419] ?Patient Active Problem List  ? Diagnosis Date Noted  ? Cellulitis in diabetic foot ([place]) 09/13/2021  ? Dyslipidemia 09/13/2021  ? Type 2 diabetes mellitus with peripheral neuropathy ([place]) 09/13/2021  ? Depression 09/13/2021  ? Essential hypertension 09/13/2021  ? AKI (acute kidney injury) ([place]) 09/13/2021  ? GERD without esophagitis 09/13/2021  ? Obstructive sleep apnea 09/13/2021  ? Gangrene of toe of right foot ([place]) 09/13/2021  ? ?PCP:  Harvie Junior, MD ?Pharmacy:   ?[place], [place] ?[place] ?[place] [address] ?Phone: [phone] Fax: [phone] ? ? ? ? ?Social Determinants of Health (SDOH) Interventions ?  ? ?Readmission Risk Interventions ?No flowsheet data found. ? ? ?

## 2021-09-13 NOTE — Assessment & Plan Note (Signed)
-   The patient will be placed on hydration with IV normal saline. - We will hold off nephrotoxins. - We will follow BMP. 

## 2021-09-13 NOTE — Assessment & Plan Note (Signed)
-   He will be monitored while he is here.  He has not tolerated CPAP in the past. ?- If needed here retrial can be performed. ?

## 2021-09-13 NOTE — Assessment & Plan Note (Signed)
-   We will continue statin therapy. 

## 2021-09-13 NOTE — Progress Notes (Signed)
NEW ADMISSION NOTE ?New Admission Note:  ? ?Arrival Method: E.D. stretcher bed ?Mental Orientation: Alert and oriented x 4. ?Telemetry:NSR  ?Assessment: Completed ?Skin; Right foot cellulitis ,bluish discoloration of right 4th toe,Bluish blister on anterior right foot,assessed with Darylene Price R.N ?IV:Rt forearm with NS '@100'$  cc/hr. ?Pain:Denies ?Tubes:None ?Safety Measures: Safety Fall Prevention Plan has been given, discussed and signed ?Admission: Completed ?[facility] Orientation: Patient has been orientated to the room, unit and staff.  ?Family:None at the bedside. ? ?Orders have been reviewed and implemented. Will continue to monitor the patient. Call light has been placed within reach and bed alarm has been activated.  ? ?Rogelia Mire, RN   ?

## 2021-09-14 ENCOUNTER — Inpatient Hospital Stay (HOSPITAL_COMMUNITY): Payer: Medicare HMO

## 2021-09-14 DIAGNOSIS — Z01810 Encounter for preprocedural cardiovascular examination: Secondary | ICD-10-CM

## 2021-09-14 DIAGNOSIS — L0390 Cellulitis, unspecified: Secondary | ICD-10-CM | POA: Diagnosis not present

## 2021-09-14 DIAGNOSIS — E11628 Type 2 diabetes mellitus with other skin complications: Secondary | ICD-10-CM | POA: Diagnosis not present

## 2021-09-14 DIAGNOSIS — L03119 Cellulitis of unspecified part of limb: Secondary | ICD-10-CM | POA: Diagnosis not present

## 2021-09-14 DIAGNOSIS — I96 Gangrene, not elsewhere classified: Secondary | ICD-10-CM

## 2021-09-14 LAB — BASIC METABOLIC PANEL WITH GFR
Anion gap: 11 (ref 5–15)
BUN: 19 mg/dL (ref 8–23)
CO2: 20 mmol/L — ABNORMAL LOW (ref 22–32)
Calcium: 8.1 mg/dL — ABNORMAL LOW (ref 8.9–10.3)
Chloride: 102 mmol/L (ref 98–111)
Creatinine, Ser: 1.32 mg/dL — ABNORMAL HIGH (ref 0.61–1.24)
GFR, Estimated: 59 mL/min — ABNORMAL LOW
Glucose, Bld: 134 mg/dL — ABNORMAL HIGH (ref 70–99)
Potassium: 3.8 mmol/L (ref 3.5–5.1)
Sodium: 133 mmol/L — ABNORMAL LOW (ref 135–145)

## 2021-09-14 LAB — GLUCOSE, CAPILLARY
Glucose-Capillary: 128 mg/dL — ABNORMAL HIGH (ref 70–99)
Glucose-Capillary: 159 mg/dL — ABNORMAL HIGH (ref 70–99)
Glucose-Capillary: 204 mg/dL — ABNORMAL HIGH (ref 70–99)
Glucose-Capillary: 154 mg/dL — ABNORMAL HIGH (ref 70–99)

## 2021-09-14 LAB — CBC WITH DIFFERENTIAL/PLATELET
Abs Immature Granulocytes: 0.37 K/uL — ABNORMAL HIGH (ref 0.00–0.07)
Basophils Absolute: 0.1 K/uL (ref 0.0–0.1)
Basophils Relative: 0 %
Eosinophils Absolute: 0 K/uL (ref 0.0–0.5)
Eosinophils Relative: 0 %
HCT: 35.3 % — ABNORMAL LOW (ref 39.0–52.0)
Hemoglobin: 11.7 g/dL — ABNORMAL LOW (ref 13.0–17.0)
Immature Granulocytes: 2 %
Lymphocytes Relative: 10 %
Lymphs Abs: 2 K/uL (ref 0.7–4.0)
MCH: 28 pg (ref 26.0–34.0)
MCHC: 33.1 g/dL (ref 30.0–36.0)
MCV: 84.4 fL (ref 80.0–100.0)
Monocytes Absolute: 2.6 K/uL — ABNORMAL HIGH (ref 0.1–1.0)
Monocytes Relative: 13 %
Neutro Abs: 15.2 K/uL — ABNORMAL HIGH (ref 1.7–7.7)
Neutrophils Relative %: 75 %
Platelets: 559 K/uL — ABNORMAL HIGH (ref 150–400)
RBC: 4.18 MIL/uL — ABNORMAL LOW (ref 4.22–5.81)
RDW: 14 % (ref 11.5–15.5)
WBC: 20.3 K/uL — ABNORMAL HIGH (ref 4.0–10.5)
nRBC: 0 % (ref 0.0–0.2)

## 2021-09-14 LAB — PHOSPHORUS: Phosphorus: 2.3 mg/dL — ABNORMAL LOW (ref 2.5–4.6)

## 2021-09-14 LAB — MAGNESIUM: Magnesium: 2 mg/dL (ref 1.7–2.4)

## 2021-09-14 MED ORDER — VANCOMYCIN HCL 1500 MG/300ML IV SOLN
1500.0000 mg | Freq: Once | INTRAVENOUS | Status: AC
  Administered 2021-09-15: 1500 mg via INTRAVENOUS
  Filled 2021-09-14: qty 300

## 2021-09-14 MED ORDER — SODIUM CHLORIDE 0.9 % IV SOLN
2.0000 g | Freq: Three times a day (TID) | INTRAVENOUS | Status: DC
  Administered 2021-09-14 – 2021-09-21 (×20): 2 g via INTRAVENOUS
  Filled 2021-09-14 (×20): qty 2

## 2021-09-14 MED ORDER — VANCOMYCIN VARIABLE DOSE PER UNSTABLE RENAL FUNCTION (PHARMACIST DOSING): Status: DC

## 2021-09-14 MED ORDER — INSULIN GLARGINE-YFGN 100 UNIT/ML ~~LOC~~ SOLN
10.0000 [IU] | Freq: Two times a day (BID) | SUBCUTANEOUS | Status: DC
  Administered 2021-09-14 – 2021-09-22 (×15): 10 [IU] via SUBCUTANEOUS
  Filled 2021-09-14 (×19): qty 0.1

## 2021-09-14 NOTE — Progress Notes (Signed)
VASCULAR LAB ? ? ? ?Vein mapping has been performed. ? ?See CV proc for preliminary results. ? ? ?Jakhari Space, RVT ?09/14/2021, 10:36 AM ? ?

## 2021-09-14 NOTE — Plan of Care (Signed)
  Problem: Elimination: Goal: Will not experience complications related to urinary retention Outcome: Adequate for Discharge   

## 2021-09-14 NOTE — Progress Notes (Signed)
?   09/14/21 1642  ?Assess: MEWS Score  ?Temp (!) 102.8 ?F (39.3 ?C)  ?BP (!) 151/70  ?Pulse Rate 86  ?Resp 18  ?SpO2 96 %  ?O2 Device Room Air  ?Assess: MEWS Score  ?MEWS Temp 2  ?MEWS Systolic 0  ?MEWS Pulse 0  ?MEWS RR 0  ?MEWS LOC 0  ?MEWS Score 2  ?MEWS Score Color Yellow  ?Assess: if the MEWS score is Yellow or Red  ?Were vital signs taken at a resting state? Yes  ?Focused Assessment No change from prior assessment  ?Early Detection of Sepsis Score *See Row Information* Medium  ?MEWS guidelines implemented *See Row Information* No, vital signs rechecked  ?Treat  ?Pain Scale 0-10  ?Pain Score 0  ?Take Vital Signs  ?Increase Vital Sign Frequency  Yellow: Q 2hr X 2 then Q 4hr X 2, if remains yellow, continue Q 4hrs  ?Escalate  ?MEWS: Escalate Yellow: discuss with charge nurse/RN and consider discussing with provider and RRT  ?Notify: Charge Nurse/RN  ?Name of Charge Nurse/RN Notified Gwenlyn Perking  ?Date Charge Nurse/RN Notified 09/14/21  ?Time Charge Nurse/RN Notified 1642  ?Notify: Provider  ?Provider Name/Title Dr Raelyn Mora  ?Date Provider Notified 09/14/21  ?Time Provider Notified [phone]  ?Notification Type Page  ?Notification Reason Other (Comment)  ?Provider response No new orders  ?Date of Provider Response 09/14/21  ?Time of Provider Response 1645  ?Notify: Rapid Response  ?Name of Rapid Response RN Notified N/A  ?Document  ?Patient Outcome Other (Comment);Stabilized after interventions  ? ? ?

## 2021-09-14 NOTE — Progress Notes (Signed)
Pharmacy Antibiotic Note ? ?[name] is a 66 y.o. male admitted on 09/12/2021 with  cellulitis/wound infection of foot. Patient likely to require revascularization.  Pharmacy has been consulted for Vancomycin/Zosyn dosing.  ? ?WBC 20.3 (stable), Tm 103, Scr 1.85>2.14>1.32 (BL?0.8-1.0).  ? ?Plan: ?Vancomycin per unstable renal function  ?- with improved renal function, calculated vancomycin dose 1500 mg IV Q24H (eAUC ~550, Scr used 1.32, Vd 0.5); of note, patient is borderline obese. Using Vd 0.72, eAUC would be 382, but elected to use lower dose due to unstable renal function. ? ?Cefepime 2g IV q12h>>2g IV Q8H (improved renal fxn)  ?De-escalate when able, F/U renal fxn, VVS recs  ?Obtain drug levels as indicated  ? ?Height: 6' (182.9 cm) ?Weight: 102.6 kg (226 lb 3.1 oz) ?IBW/kg (Calculated) : 77.6 ? ?Temp (24hrs), Avg:101 ?F (38.3 ?C), Min:98 ?F (36.7 ?C), Max:103 ?F (39.4 ?C) ? ?Recent Labs  ?Lab 09/10/21 ?3845 09/12/21 ?2309 09/13/21 ?3646 09/13/21 ?0210 09/13/21 ?0303 09/14/21 ?8032  ?WBC  --  21.8*  --   --  20.1* 20.3*  ?CREATININE 1.20 1.85*  --   --  2.14* 1.32*  ?LATICACIDVEN  --   --  1.8 1.4  --   --   ? ?  ?Estimated Creatinine Clearance: 66.4 mL/min (A) (by C-G formula based on SCr of 1.32 mg/dL (H)).   ? ?Allergies  ?Allergen Reactions  ? Gemfibrozil Other (See Comments)  ?  Doesn't know its been awhile  ? Oxycontin [Oxycodone Hcl] Other (See Comments)  ?  Bad reaction "took me to a place I never wanna go again"  ? Simvastatin Other (See Comments)  ?  Says he doesn't know its been awhile  ? ?Dose adjustments this admission:  ?Cefepime 2g IV Q12H>2g IV Q8H (09/14/21)  ? ?Microbiology results: ?3/11 Bcx: NGTD  ? ?Antimicrobials this admission:  ?Vancomycin 3/11>>  ?Cefepime 3/11>> ?Flagyl 3/11>> ?Zosyn 3/11 x1  ? ?Adria Dill, PharmD ?PGY-1 Acute Care Resident  ?09/14/2021 11:06 AM  ? ? ? ?

## 2021-09-14 NOTE — Progress Notes (Signed)
PROGRESS NOTE    [name]  SEG:[id number] DOB: 13-Apr-1956 DOA: 09/12/2021 PCP: Harvie Junior, MD    Brief Narrative:  66 year old gentleman with history of type 2 diabetes on insulin, essential hypertension, peripheral neuropathy and obstructive sleep apnea presented with right foot redness progressed to blistering on the dorsum of the right foot and associated spreading inflammation rapidly progressing within 24 hours.  Patient does have history of nonhealing ulcer on the sole of the right foot.  Patient was seen by vascular surgery as outpatient and underwent right leg angiography and angioplasty by Dr. Unk Lightning on 3/8 for same.  In the emergency room patient with evidence of acute kidney injury, leukocytosis with WBC count 21.8 and thrombocytosis.  Blood cultures drawn.  Started on broad-spectrum antibiotics and admitted to the hospital. Patient was already scheduled to have surgery/ Bypass ? next week.   Assessment & Plan:   Cellulitis and abscess of the right foot, diabetic foot infection, sepsis present on admission with leukocytosis, tachycardia and acute renal failure: Patient was started on IV vancomycin and cefepime for severe nonpurulent cellulitis.  Blood cultures negative so far.  Seen by vascular surgery.  Anticipating to amputation/transmetatarsal amputation.  Vascular surgery deciding. Angiogram and angioplasty 3/8 with severe peripheral vascular disease, balloon angioplasty superficial femoral artery.  We will continue antibiotics.  Acute kidney injury: Recent known normal renal function 1.08-1.2.  Creatinine 2.14 on  presentation. Consistent with ATN from sepsis.  Appropriately trending down.  Urine output is adequate.  Continue to monitor.  Essential hypertension: At risk of hypotension.  Holding off ACE inhibitor and hydrochlorothiazide given acute kidney injury.  Continue beta-blockers.  As needed IV labetalol.  Type 2 diabetes, uncontrolled with hyperglycemia.   Complicated with peripheral neuropathy: Started on basal and sliding scale insulin.  Keeping on low-dose insulin, going to be n.p.o. past midnight.  Obstructive sleep apnea: Not using CPAP at home as he could not tolerate that.  GERD: On PPI.  Continue.   DVT prophylaxis: enoxaparin (LOVENOX) injection 40 mg Start: 09/13/21 1000   Code Status: Full code Family Communication: None at the bedside. Disposition Plan: Status is: Inpatient Remains inpatient appropriate because: Severe leg infection.  Ischemia.     Consultants:  Vascular surgery  Procedures:  None  Antimicrobials:  Vancomycin and cefepime 3/10----   Subjective:  Patient seen and examined.  Complains of poor sleep.  Tmax 102.5 overnight.  Patient does not remember having high fever.  Wants something for the sleep.  More anxious about saving leg. He thinks this is Thursday.  Objective: Vitals:   09/13/21 2005 09/14/21 0009 09/14/21 0442 09/14/21 0859  BP: (!) 155/81 117/70 (!) 147/72 127/68  Pulse: 92 89 88 99  Resp: '20 20 20 18  '$ Temp: (!) 103 F (39.4 C) (!) 102.5 F (39.2 C) (!) 100.7 F (38.2 C) (!) 100.8 F (38.2 C)  TempSrc: Oral Oral Oral Oral  SpO2: 95% 92% 100% 94%  Weight:   102.6 kg   Height:        Intake/Output Summary (Last 24 hours) at 09/14/2021 1031 Last data filed at 09/14/2021 0900 Gross per 24 hour  Intake 2960.75 ml  Output 2850 ml  Net 110.75 ml   Filed Weights   09/12/21 2256 09/14/21 0442  Weight: 106.1 kg 102.6 kg    Examination:  General exam: Appears calm and comfortable but anxious.  On room air. Respiratory system: Clear to auscultation. Respiratory effort normal. Cardiovascular system: S1 & S2 heard, RRR.  No JVD, murmurs, rubs, gallops or clicks. . Gastrointestinal system: Abdomen is nondistended, soft and nontender. No organomegaly or masses felt. Normal bowel sounds heard. Central nervous system: Alert and oriented.  Was confused about the day.  No focal  neurological deficits. Extremities:  Right lower extremity exam, pictures in the chart. Chronic ischemic changes.  Gangrene bullae fourth toe.  Data Reviewed: I have personally reviewed following labs and imaging studies  CBC: Recent Labs  Lab 09/10/21 0813 09/12/21 2309 09/13/21 0303 09/14/21 0058  WBC  --  21.8* 20.1* 20.3*  NEUTROABS  --   --   --  15.2*  HGB 14.6 13.1 12.4* 11.7*  HCT 43.0 40.3 37.3* 35.3*  MCV  --  85.7 85.9 84.4  PLT  --  623* 490* 767*   Basic Metabolic Panel: Recent Labs  Lab 09/10/21 0813 09/12/21 2309 09/13/21 0303 09/14/21 0058  NA 137 132* 132* 133*  K 3.5 4.0 3.8 3.8  CL 101 96* 97* 102  CO2  --  21* 21* 20*  GLUCOSE 104* 257* 200* 134*  BUN 23 25* 27* 19  CREATININE 1.20 1.85* 2.14* 1.32*  CALCIUM  --  8.8* 8.4* 8.1*  MG  --   --   --  2.0  PHOS  --   --   --  2.3*   GFR: Estimated Creatinine Clearance: 66.4 mL/min (A) (by C-G formula based on SCr of 1.32 mg/dL (H)). Liver Function Tests: Recent Labs  Lab 09/12/21 2309  AST 27  ALT 32  ALKPHOS 83  BILITOT 0.6  PROT 8.0  ALBUMIN 3.0*   No results for input(s): LIPASE, AMYLASE in the last 168 hours. No results for input(s): AMMONIA in the last 168 hours. Coagulation Profile: No results for input(s): INR, PROTIME in the last 168 hours. Cardiac Enzymes: No results for input(s): CKTOTAL, CKMB, CKMBINDEX, TROPONINI in the last 168 hours. BNP (last 3 results) No results for input(s): PROBNP in the last 8760 hours. HbA1C: No results for input(s): HGBA1C in the last 72 hours. CBG: Recent Labs  Lab 09/13/21 0742 09/13/21 1135 09/13/21 1620 09/13/21 2104 09/14/21 0730  GLUCAP 148* 114* 91 206* 128*   Lipid Profile: No results for input(s): CHOL, HDL, LDLCALC, TRIG, CHOLHDL, LDLDIRECT in the last 72 hours. Thyroid Function Tests: No results for input(s): TSH, T4TOTAL, FREET4, T3FREE, THYROIDAB in the last 72 hours. Anemia Panel: No results for input(s): VITAMINB12,  FOLATE, FERRITIN, TIBC, IRON, RETICCTPCT in the last 72 hours. Sepsis Labs: Recent Labs  Lab 09/13/21 0053 09/13/21 0210  LATICACIDVEN 1.8 1.4    Recent Results (from the past 240 hour(s))  Resp Panel by RT-PCR (Flu A&B, Covid)     Status: None   Collection Time: 09/13/21 12:29 AM   Specimen: Nasopharyngeal(NP) swabs in vial transport medium  Result Value Ref Range Status   SARS Coronavirus 2 by RT PCR NEGATIVE NEGATIVE Final    Comment: (NOTE) SARS-CoV-2 target nucleic acids are NOT DETECTED.  The SARS-CoV-2 RNA is generally detectable in upper respiratory specimens during the acute phase of infection. The lowest concentration of SARS-CoV-2 viral copies this assay can detect is 138 copies/mL. A negative result does not preclude SARS-Cov-2 infection and should not be used as the sole basis for treatment or other patient management decisions. A negative result may occur with  improper specimen collection/handling, submission of specimen other than nasopharyngeal swab, presence of viral mutation(s) within the areas targeted by this assay, and inadequate number of viral copies(<138 copies/mL). A  negative result must be combined with clinical observations, patient history, and epidemiological information. The expected result is Negative.  Fact Sheet for Patients:  EntrepreneurPulse.com.au  Fact Sheet for Healthcare Providers:  IncredibleEmployment.be  This test is no t yet approved or cleared by the [place] FDA and  has been authorized for detection and/or diagnosis of SARS-CoV-2 by FDA under an Emergency Use Authorization (EUA). This EUA will remain  in effect (meaning this test can be used) for the duration of the COVID-19 declaration under Section 564(b)(1) of the Act, 21 U.S.C.section 360bbb-3(b)(1), unless the authorization is terminated  or revoked sooner.       Influenza A by PCR NEGATIVE NEGATIVE Final   Influenza B by PCR  NEGATIVE NEGATIVE Final    Comment: (NOTE) The Xpert Xpress SARS-CoV-2/FLU/RSV plus assay is intended as an aid in the diagnosis of influenza from Nasopharyngeal swab specimens and should not be used as a sole basis for treatment. Nasal washings and aspirates are unacceptable for Xpert Xpress SARS-CoV-2/FLU/RSV testing.  Fact Sheet for Patients: EntrepreneurPulse.com.au  Fact Sheet for Healthcare Providers: IncredibleEmployment.be  This test is not yet approved or cleared by the [place] FDA and has been authorized for detection and/or diagnosis of SARS-CoV-2 by FDA under an Emergency Use Authorization (EUA). This EUA will remain in effect (meaning this test can be used) for the duration of the COVID-19 declaration under Section 564(b)(1) of the Act, 21 U.S.C. section 360bbb-3(b)(1), unless the authorization is terminated or revoked.  Performed at [facility], [place] [address], [place], [place] [address]   Culture, blood (Routine X 2) w Reflex to ID Panel     Status: None (Preliminary result)   Collection Time: 09/13/21 12:53 AM   Specimen: BLOOD RIGHT ARM  Result Value Ref Range Status   Specimen Description BLOOD RIGHT ARM  Final   Special Requests   Final    BOTTLES DRAWN AEROBIC AND ANAEROBIC Blood Culture results may not be optimal due to an inadequate volume of blood received in culture bottles   Culture   Final    NO GROWTH 1 DAY Performed at [facility], [place] [address], [place], [place] [address]    Report Status PENDING  Incomplete  Culture, blood (Routine X 2) w Reflex to ID Panel     Status: None (Preliminary result)   Collection Time: 09/13/21  1:01 AM   Specimen: BLOOD RIGHT ARM  Result Value Ref Range Status   Specimen Description BLOOD RIGHT ARM  Final   Special Requests   Final    BOTTLES DRAWN AEROBIC AND ANAEROBIC Blood Culture adequate volume   Culture   Final    NO GROWTH 1 DAY Performed at [facility], [place] [address], [place], [place] [address]    Report Status PENDING  Incomplete         Radiology Studies: DG Foot Complete Right  Result Date: 09/13/2021 CLINICAL DATA:  Infection.  Redness, swelling along dorsum of foot. EXAM: RIGHT FOOT COMPLETE - 3+ VIEW COMPARISON:  None. FINDINGS: Soft tissue swelling in the midfoot/forefoot region. No acute bony abnormality. Specifically, no fracture, subluxation, or dislocation. Joint spaces maintained. No bone destruction to suggest osteomyelitis. No radiopaque foreign bodies. IMPRESSION: No acute bony abnormality. Electronically Signed   By: Rolm Baptise M.D.   On: 09/13/2021 01:18        Scheduled Meds:  atorvastatin  80 mg Oral Daily   buPROPion  300 mg Oral Daily  cholecalciferol  2,000 Units Oral BID   donepezil  5 mg Oral Daily   enoxaparin (LOVENOX) injection  40 mg Subcutaneous Daily   fluticasone furoate-vilanterol  1 puff Inhalation Daily   gabapentin  600 mg Oral TID   insulin aspart  0-15 Units Subcutaneous TID AC & HS   insulin glargine-yfgn  10 Units Subcutaneous BID   methocarbamol  500 mg Oral TID AC & HS   metoprolol tartrate  50 mg Oral BID   OLANZapine  2.5 mg Oral QHS   OXcarbazepine  150 mg Oral BID   pantoprazole  40 mg Oral Daily   Continuous Infusions:  sodium chloride 100 mL/hr at 09/14/21 0119   ceFEPime (MAXIPIME) IV 2 g (09/13/21 2321)   metronidazole 500 mg (09/14/21 0652)   [START ON 09/15/2021] vancomycin       LOS: 1 day    Time spent: 35 minutes     Barb Merino, MD [facility] Pager [phone]

## 2021-09-14 NOTE — Progress Notes (Signed)
?  Progress Note ? ? ? ?09/14/2021 ?11:35 AM ?* No surgery date entered * ? ?Subjective: No overnight issues ? ?Vitals:  ? 09/14/21 0442 09/14/21 0859  ?BP: (!) 147/72 127/68  ?Pulse: 88 99  ?Resp: 20 18  ?Temp: (!) 100.7 ?F (38.2 ?C) (!) 100.8 ?F (38.2 ?C)  ?SpO2: 100% 94%  ? ? ?Physical Exam: ?Awake alert oriented ?Palpable right popliteal pulse ?Stable gangrenous changes right foot ? ?CBC ?   ?Component Value Date/Time  ? WBC 20.3 (H) 09/14/2021 0058  ? RBC 4.18 (L) 09/14/2021 0058  ? HGB 11.7 (L) 09/14/2021 0058  ? HCT 35.3 (L) 09/14/2021 0058  ? PLT 559 (H) 09/14/2021 0058  ? MCV 84.4 09/14/2021 0058  ? MCH 28.0 09/14/2021 0058  ? MCHC 33.1 09/14/2021 0058  ? RDW 14.0 09/14/2021 0058  ? LYMPHSABS 2.0 09/14/2021 0058  ? MONOABS 2.6 (H) 09/14/2021 0058  ? EOSABS 0.0 09/14/2021 0058  ? BASOSABS 0.1 09/14/2021 0058  ? ? ?BMET ?   ?Component Value Date/Time  ? NA 133 (L) 09/14/2021 0058  ? K 3.8 09/14/2021 0058  ? CL 102 09/14/2021 0058  ? CO2 20 (L) 09/14/2021 0058  ? GLUCOSE 134 (H) 09/14/2021 0058  ? BUN 19 09/14/2021 0058  ? CREATININE 1.32 (H) 09/14/2021 0058  ? CALCIUM 8.1 (L) 09/14/2021 0058  ? GFRNONAA 59 (L) 09/14/2021 0058  ? GFRAA >60 02/17/2015 2030  ? ? ?INR ?No results found for: INR ? ? ?Intake/Output Summary (Last 24 hours) at 09/14/2021 1135 ?Last data filed at 09/14/2021 0900 ?Gross per 24 hour  ?Intake 2960.75 ml  ?Output 2850 ml  ?Net 110.75 ml  ? ? ? ?Assessment:  66 y.o. male is s/p drug-coated balloon angioplasty right SFA with progressive right lower extremity ischemic changes. ? ?Plan: ?I have tentatively posted him for the OR with Dr. Unk Lightning tomorrow for possible bypass and fourth toe amputation.  N.p.o. past midnight. ? ? ?Lizbeth Feijoo C. Donzetta Matters, MD ?[facility] ?Office: [phone] ?Pager: [phone] ? ?09/14/2021 ?11:35 AM ? ?

## 2021-09-14 NOTE — Progress Notes (Signed)
VASCULAR LAB ? ? ? ?ABIs have been performed. ? ?See CV proc for preliminary results. ? ? ?Jaideep Pollack, RVT ?09/14/2021, 10:40 AM ? ?

## 2021-09-15 ENCOUNTER — Encounter (HOSPITAL_COMMUNITY): Payer: Self-pay | Admitting: Family Medicine

## 2021-09-15 ENCOUNTER — Encounter (HOSPITAL_COMMUNITY)
Admission: EM | Disposition: A | Discharge status: Skilled Nursing Facility | Payer: Self-pay | Source: Home / Self Care | Attending: Family Medicine

## 2021-09-15 ENCOUNTER — Other Ambulatory Visit: Payer: Self-pay | Admitting: *Deleted

## 2021-09-15 ENCOUNTER — Other Ambulatory Visit: Payer: Self-pay

## 2021-09-15 ENCOUNTER — Inpatient Hospital Stay (HOSPITAL_COMMUNITY): Payer: Medicare HMO

## 2021-09-15 DIAGNOSIS — Z7984 Long term (current) use of oral hypoglycemic drugs: Secondary | ICD-10-CM

## 2021-09-15 DIAGNOSIS — Z794 Long term (current) use of insulin: Secondary | ICD-10-CM

## 2021-09-15 DIAGNOSIS — I70221 Atherosclerosis of native arteries of extremities with rest pain, right leg: Secondary | ICD-10-CM

## 2021-09-15 DIAGNOSIS — L03119 Cellulitis of unspecified part of limb: Secondary | ICD-10-CM | POA: Diagnosis not present

## 2021-09-15 DIAGNOSIS — E11628 Type 2 diabetes mellitus with other skin complications: Secondary | ICD-10-CM | POA: Diagnosis not present

## 2021-09-15 DIAGNOSIS — E1151 Type 2 diabetes mellitus with diabetic peripheral angiopathy without gangrene: Secondary | ICD-10-CM

## 2021-09-15 DIAGNOSIS — I70223 Atherosclerosis of native arteries of extremities with rest pain, bilateral legs: Secondary | ICD-10-CM

## 2021-09-15 LAB — CBC WITH DIFFERENTIAL/PLATELET
Abs Immature Granulocytes: 0.37 10*3/uL — ABNORMAL HIGH (ref 0.00–0.07)
Basophils Absolute: 0.1 10*3/uL (ref 0.0–0.1)
Basophils Relative: 0 %
Eosinophils Absolute: 0.1 10*3/uL (ref 0.0–0.5)
Eosinophils Relative: 0 %
HCT: 38 % — ABNORMAL LOW (ref 39.0–52.0)
Hemoglobin: 12.5 g/dL — ABNORMAL LOW (ref 13.0–17.0)
Immature Granulocytes: 2 %
Lymphocytes Relative: 11 %
Lymphs Abs: 2.7 10*3/uL (ref 0.7–4.0)
MCH: 27.7 pg (ref 26.0–34.0)
MCHC: 32.9 g/dL (ref 30.0–36.0)
MCV: 84.3 fL (ref 80.0–100.0)
Monocytes Absolute: 2.9 10*3/uL — ABNORMAL HIGH (ref 0.1–1.0)
Monocytes Relative: 12 %
Neutro Abs: 17.9 10*3/uL — ABNORMAL HIGH (ref 1.7–7.7)
Neutrophils Relative %: 75 %
Platelets: 525 10*3/uL — ABNORMAL HIGH (ref 150–400)
RBC: 4.51 MIL/uL (ref 4.22–5.81)
RDW: 14.2 % (ref 11.5–15.5)
WBC: 24 10*3/uL — ABNORMAL HIGH (ref 4.0–10.5)
nRBC: 0 % (ref 0.0–0.2)

## 2021-09-15 LAB — POCT ACTIVATED CLOTTING TIME
Activated Clotting Time: 251 seconds
Activated Clotting Time: 293 seconds

## 2021-09-15 LAB — BASIC METABOLIC PANEL
Anion gap: 12 (ref 5–15)
BUN: 14 mg/dL (ref 8–23)
CO2: 18 mmol/L — ABNORMAL LOW (ref 22–32)
Calcium: 8.3 mg/dL — ABNORMAL LOW (ref 8.9–10.3)
Chloride: 106 mmol/L (ref 98–111)
Creatinine, Ser: 1.18 mg/dL (ref 0.61–1.24)
GFR, Estimated: >60 mL/min (ref >60)
Glucose, Bld: 115 mg/dL — ABNORMAL HIGH (ref 70–99)
Potassium: 4.1 mmol/L (ref 3.5–5.1)
Sodium: 136 mmol/L (ref 135–145)

## 2021-09-15 LAB — GLUCOSE, CAPILLARY
Glucose-Capillary: 122 mg/dL — ABNORMAL HIGH (ref 70–99)
Glucose-Capillary: 110 mg/dL — ABNORMAL HIGH (ref 70–99)
Glucose-Capillary: 91 mg/dL (ref 70–99)
Glucose-Capillary: 133 mg/dL — ABNORMAL HIGH (ref 70–99)
Glucose-Capillary: 133 mg/dL — ABNORMAL HIGH (ref 70–99)
Glucose-Capillary: 131 mg/dL — ABNORMAL HIGH (ref 70–99)

## 2021-09-15 LAB — SURGICAL PCR SCREEN
MRSA, PCR: NEGATIVE
Staphylococcus aureus: NEGATIVE

## 2021-09-15 LAB — HEMOGLOBIN A1C
Hgb A1c MFr Bld: 7.6 % — ABNORMAL HIGH (ref 4.8–5.6)
Mean Plasma Glucose: 171 mg/dL

## 2021-09-15 SURGERY — CREATION, BYPASS, ARTERIAL, FEMORAL TO TIBIAL, USING GRAFT
Anesthesia: General | Site: Toe | Laterality: Right

## 2021-09-15 MED ORDER — ASPIRIN EC 81 MG PO TBEC
81.0000 mg | DELAYED_RELEASE_TABLET | Freq: Every day | ORAL | Status: DC
  Administered 2021-09-16 – 2021-09-22 (×7): 81 mg via ORAL
  Filled 2021-09-15 (×7): qty 1

## 2021-09-15 MED ORDER — ORAL CARE MOUTH RINSE: 15.0000 mL | Freq: Once | OROMUCOSAL | Status: AC

## 2021-09-15 MED ORDER — ONDANSETRON HCL 4 MG/2ML IJ SOLN
INTRAMUSCULAR | Status: AC
  Filled 2021-09-15: qty 2

## 2021-09-15 MED ORDER — PAPAVERINE HCL 30 MG/ML IJ SOLN
INTRAMUSCULAR | Status: DC | PRN
Start: 2021-09-15 — End: 2021-09-15
  Administered 2021-09-15: 60 mg via INTRAVENOUS

## 2021-09-15 MED ORDER — CHLORHEXIDINE GLUCONATE CLOTH 2 % EX PADS
6.0000 | MEDICATED_PAD | Freq: Every day | CUTANEOUS | Status: DC
  Administered 2021-09-15 – 2021-09-19 (×5): 6 via TOPICAL

## 2021-09-15 MED ORDER — HEPARIN SODIUM (PORCINE) 1000 UNIT/ML IJ SOLN
INTRAMUSCULAR | Status: DC | PRN
Start: 2021-09-15 — End: 2021-09-15
  Administered 2021-09-15: 5000 [IU] via INTRAVENOUS
  Administered 2021-09-15: 10000 [IU] via INTRAVENOUS

## 2021-09-15 MED ORDER — PROPOFOL 10 MG/ML IV BOLUS
INTRAVENOUS | Status: AC
  Filled 2021-09-15: qty 20

## 2021-09-15 MED ORDER — SODIUM CHLORIDE 0.9 % IV SOLN: INTRAVENOUS | Status: DC

## 2021-09-15 MED ORDER — MIDAZOLAM HCL 2 MG/2ML IJ SOLN
INTRAMUSCULAR | Status: AC
  Filled 2021-09-15: qty 2

## 2021-09-15 MED ORDER — ONDANSETRON HCL 4 MG/2ML IJ SOLN
INTRAMUSCULAR | Status: DC | PRN
  Administered 2021-09-15: 4 mg via INTRAVENOUS

## 2021-09-15 MED ORDER — MAGNESIUM SULFATE 2 GM/50ML IV SOLN: 2.0000 g | Freq: Every day | INTRAVENOUS | Status: DC | PRN

## 2021-09-15 MED ORDER — DEXAMETHASONE SODIUM PHOSPHATE 10 MG/ML IJ SOLN
INTRAMUSCULAR | Status: DC | PRN
Start: 2021-09-15 — End: 2021-09-15
  Administered 2021-09-15: 5 mg via INTRAVENOUS

## 2021-09-15 MED ORDER — SUGAMMADEX SODIUM 200 MG/2ML IV SOLN
INTRAVENOUS | Status: DC | PRN
  Administered 2021-09-15: 200 mg via INTRAVENOUS

## 2021-09-15 MED ORDER — PHENYLEPHRINE 40 MCG/ML (10ML) SYRINGE FOR IV PUSH (FOR BLOOD PRESSURE SUPPORT)
PREFILLED_SYRINGE | INTRAVENOUS | Status: AC
  Filled 2021-09-15: qty 20

## 2021-09-15 MED ORDER — PHENOL 1.4 % MT LIQD: 1.0000 | OROMUCOSAL | Status: DC | PRN

## 2021-09-15 MED ORDER — HYDRALAZINE HCL 20 MG/ML IJ SOLN: 5.0000 mg | INTRAMUSCULAR | Status: DC | PRN

## 2021-09-15 MED ORDER — FENTANYL CITRATE (PF) 250 MCG/5ML IJ SOLN
INTRAMUSCULAR | Status: DC | PRN
  Administered 2021-09-15 (×2): 50 ug via INTRAVENOUS
  Administered 2021-09-15: 100 ug via INTRAVENOUS
  Administered 2021-09-15: 50 ug via INTRAVENOUS

## 2021-09-15 MED ORDER — ALUM & MAG HYDROXIDE-SIMETH 200-200-20 MG/5ML PO SUSP: 15.0000 mL | ORAL | Status: DC | PRN

## 2021-09-15 MED ORDER — POTASSIUM CHLORIDE CRYS ER 20 MEQ PO TBCR: 20.0000 meq | EXTENDED_RELEASE_TABLET | Freq: Every day | ORAL | Status: DC | PRN

## 2021-09-15 MED ORDER — FENTANYL CITRATE (PF) 100 MCG/2ML IJ SOLN: 25.0000 ug | INTRAMUSCULAR | Status: DC | PRN

## 2021-09-15 MED ORDER — GUAIFENESIN-DM 100-10 MG/5ML PO SYRP: 15.0000 mL | ORAL_SOLUTION | ORAL | Status: DC | PRN

## 2021-09-15 MED ORDER — CHLORHEXIDINE GLUCONATE 0.12 % MT SOLN
OROMUCOSAL | Status: AC
  Administered 2021-09-15: 15 mL via OROMUCOSAL
  Filled 2021-09-15: qty 15

## 2021-09-15 MED ORDER — METOPROLOL TARTRATE 5 MG/5ML IV SOLN: 2.0000 mg | INTRAVENOUS | Status: DC | PRN

## 2021-09-15 MED ORDER — SODIUM CHLORIDE 0.9 % IV SOLN: 500.0000 mL | Freq: Once | INTRAVENOUS | Status: DC | PRN

## 2021-09-15 MED ORDER — LIDOCAINE 2% (20 MG/ML) 5 ML SYRINGE
INTRAMUSCULAR | Status: DC | PRN
  Administered 2021-09-15: 80 mg via INTRAVENOUS

## 2021-09-15 MED ORDER — ONDANSETRON HCL 4 MG/2ML IJ SOLN: 4.0000 mg | Freq: Once | INTRAMUSCULAR | Status: DC | PRN

## 2021-09-15 MED ORDER — DEXAMETHASONE SODIUM PHOSPHATE 10 MG/ML IJ SOLN
INTRAMUSCULAR | Status: AC
  Filled 2021-09-15: qty 1

## 2021-09-15 MED ORDER — COLLAGENASE 250 UNIT/GM EX OINT
TOPICAL_OINTMENT | Freq: Every day | CUTANEOUS | Status: DC
  Filled 2021-09-15 (×2): qty 30

## 2021-09-15 MED ORDER — MORPHINE SULFATE (PF) 2 MG/ML IV SOLN
2.0000 mg | INTRAVENOUS | Status: DC | PRN
  Administered 2021-09-15: 4 mg via INTRAVENOUS
  Administered 2021-09-16: 2 mg via INTRAVENOUS
  Administered 2021-09-20 – 2021-09-21 (×3): 4 mg via INTRAVENOUS
  Filled 2021-09-15: qty 2
  Filled 2021-09-15: qty 1
  Filled 2021-09-15 (×3): qty 2

## 2021-09-15 MED ORDER — PHENYLEPHRINE 40 MCG/ML (10ML) SYRINGE FOR IV PUSH (FOR BLOOD PRESSURE SUPPORT)
PREFILLED_SYRINGE | INTRAVENOUS | Status: DC | PRN
  Administered 2021-09-15 (×2): 40 ug via INTRAVENOUS
  Administered 2021-09-15: 80 ug via INTRAVENOUS
  Administered 2021-09-15: 40 ug via INTRAVENOUS

## 2021-09-15 MED ORDER — LACTATED RINGERS IV SOLN: INTRAVENOUS | Status: DC

## 2021-09-15 MED ORDER — LABETALOL HCL 5 MG/ML IV SOLN
10.0000 mg | INTRAVENOUS | Status: DC | PRN
  Filled 2021-09-15: qty 4

## 2021-09-15 MED ORDER — LIDOCAINE 2% (20 MG/ML) 5 ML SYRINGE
INTRAMUSCULAR | Status: AC
  Filled 2021-09-15: qty 5

## 2021-09-15 MED ORDER — FENTANYL CITRATE (PF) 250 MCG/5ML IJ SOLN
INTRAMUSCULAR | Status: AC
  Filled 2021-09-15: qty 5

## 2021-09-15 MED ORDER — CEFAZOLIN SODIUM-DEXTROSE 2-3 GM-%(50ML) IV SOLR
INTRAVENOUS | Status: DC | PRN
  Administered 2021-09-15: 2 g via INTRAVENOUS

## 2021-09-15 MED ORDER — HEPARIN SODIUM (PORCINE) 1000 UNIT/ML IJ SOLN
INTRAMUSCULAR | Status: AC
  Filled 2021-09-15: qty 10

## 2021-09-15 MED ORDER — MIDAZOLAM HCL 2 MG/2ML IJ SOLN
INTRAMUSCULAR | Status: DC | PRN
  Administered 2021-09-15: 2 mg via INTRAVENOUS

## 2021-09-15 MED ORDER — PROPOFOL 10 MG/ML IV BOLUS
INTRAVENOUS | Status: DC | PRN
  Administered 2021-09-15: 120 mg via INTRAVENOUS
  Administered 2021-09-15: 30 mg via INTRAVENOUS

## 2021-09-15 MED ORDER — BISACODYL 5 MG PO TBEC: 5.0000 mg | DELAYED_RELEASE_TABLET | Freq: Every day | ORAL | Status: DC | PRN

## 2021-09-15 MED ORDER — SENNOSIDES-DOCUSATE SODIUM 8.6-50 MG PO TABS: 1.0000 | ORAL_TABLET | Freq: Every evening | ORAL | Status: DC | PRN

## 2021-09-15 MED ORDER — 0.9 % SODIUM CHLORIDE (POUR BTL) OPTIME
TOPICAL | Status: DC | PRN
  Administered 2021-09-15: 2000 mL

## 2021-09-15 MED ORDER — ROCURONIUM BROMIDE 10 MG/ML (PF) SYRINGE
PREFILLED_SYRINGE | INTRAVENOUS | Status: AC
  Filled 2021-09-15: qty 10

## 2021-09-15 MED ORDER — CEFAZOLIN SODIUM 1 G IJ SOLR
INTRAMUSCULAR | Status: AC
  Filled 2021-09-15: qty 20

## 2021-09-15 MED ORDER — CLOPIDOGREL BISULFATE 75 MG PO TABS
75.0000 mg | ORAL_TABLET | Freq: Every day | ORAL | Status: DC
  Administered 2021-09-16 – 2021-09-22 (×7): 75 mg via ORAL
  Filled 2021-09-15 (×7): qty 1

## 2021-09-15 MED ORDER — HEPARIN 6000 UNIT IRRIGATION SOLUTION
Status: DC | PRN
  Administered 2021-09-15: 1

## 2021-09-15 MED ORDER — CHLORHEXIDINE GLUCONATE 0.12 % MT SOLN: 15.0000 mL | Freq: Once | OROMUCOSAL | Status: AC

## 2021-09-15 MED ORDER — ROCURONIUM BROMIDE 10 MG/ML (PF) SYRINGE
PREFILLED_SYRINGE | INTRAVENOUS | Status: DC | PRN
  Administered 2021-09-15 (×3): 20 mg via INTRAVENOUS
  Administered 2021-09-15: 60 mg via INTRAVENOUS
  Administered 2021-09-15: 20 mg via INTRAVENOUS

## 2021-09-15 MED ORDER — DOCUSATE SODIUM 100 MG PO CAPS
100.0000 mg | ORAL_CAPSULE | Freq: Every day | ORAL | Status: DC
  Administered 2021-09-16 – 2021-09-21 (×5): 100 mg via ORAL
  Filled 2021-09-15 (×6): qty 1

## 2021-09-15 SURGICAL SUPPLY — 65 items
BAG ISL DRAPE 18X18 STRL (DRAPES) ×4
BAG ISOLATION DRAPE 18X18 (DRAPES) ×1 IMPLANT
BANDAGE ESMARK 6X9 LF (GAUZE/BANDAGES/DRESSINGS) ×1 IMPLANT
BNDG CMPR 9X6 STRL LF SNTH (GAUZE/BANDAGES/DRESSINGS) ×4
BNDG ELASTIC 4X5.8 VLCR STR LF (GAUZE/BANDAGES/DRESSINGS) ×3 IMPLANT
BNDG ESMARK 6X9 LF (GAUZE/BANDAGES/DRESSINGS) ×5
BNDG GAUZE ELAST 4 BULKY (GAUZE/BANDAGES/DRESSINGS) ×3 IMPLANT
CANISTER SUCT 3000ML PPV (MISCELLANEOUS) ×5 IMPLANT
CANNULA VESSEL 3MM 2 BLNT TIP (CANNULA) ×6 IMPLANT
CLIP LIGATING EXTRA MED SLVR (CLIP) ×2 IMPLANT
CLIP LIGATING EXTRA SM BLUE (MISCELLANEOUS) ×6 IMPLANT
COVER SURGICAL LIGHT HANDLE (MISCELLANEOUS) ×3 IMPLANT
CUFF TOURN SGL QUICK 24 (TOURNIQUET CUFF)
CUFF TOURN SGL QUICK 34 (TOURNIQUET CUFF) ×5
CUFF TOURN SGL QUICK 42 (TOURNIQUET CUFF) IMPLANT
CUFF TRNQT CYL 24X4X16.5-23 (TOURNIQUET CUFF) IMPLANT
CUFF TRNQT CYL 34X4.125X (TOURNIQUET CUFF) ×1 IMPLANT
DRAIN CHANNEL 15F RND FF W/TCR (WOUND CARE) IMPLANT
DRAPE EXTREMITY T 121X128X90 (DISPOSABLE) ×5 IMPLANT
DRAPE HALF SHEET 40X57 (DRAPES) ×7 IMPLANT
DRAPE ISOLATION BAG 18X18 (DRAPES) ×5
ELECT REM PT RETURN 9FT ADLT (ELECTROSURGICAL) ×5
ELECTRODE REM PT RTRN 9FT ADLT (ELECTROSURGICAL) ×4 IMPLANT
EVACUATOR SILICONE 100CC (DRAIN) IMPLANT
GAUZE SPONGE 4X4 12PLY STRL (GAUZE/BANDAGES/DRESSINGS) ×5 IMPLANT
GLOVE SRG 8 PF TXTR STRL LF DI (GLOVE) ×8 IMPLANT
GLOVE SURG POLYISO LF SZ8 (GLOVE) IMPLANT
GLOVE SURG UNDER POLY LF SZ8 (GLOVE) ×10
GOWN STRL REUS W/ TWL LRG LVL3 (GOWN DISPOSABLE) ×8 IMPLANT
GOWN STRL REUS W/TWL 2XL LVL3 (GOWN DISPOSABLE) ×5 IMPLANT
GOWN STRL REUS W/TWL LRG LVL3 (GOWN DISPOSABLE) ×10
INSERT FOGARTY SM (MISCELLANEOUS) ×2 IMPLANT
KIT BASIN OR (CUSTOM PROCEDURE TRAY) ×5 IMPLANT
KIT TURNOVER KIT B (KITS) ×5 IMPLANT
NEEDLE HYPO 22GX1.5 SAFETY (NEEDLE) ×2 IMPLANT
NS IRRIG 1000ML POUR BTL (IV SOLUTION) ×10 IMPLANT
PACK GENERAL/GYN (CUSTOM PROCEDURE TRAY) ×5 IMPLANT
PACK PERIPHERAL VASCULAR (CUSTOM PROCEDURE TRAY) ×5 IMPLANT
PAD ARMBOARD 7.5X6 YLW CONV (MISCELLANEOUS) ×10 IMPLANT
PENCIL BUTTON HOLSTER BLD 10FT (ELECTRODE) ×2 IMPLANT
SET COLLECT BLD 21X3/4 12 (NEEDLE) IMPLANT
STOPCOCK 4 WAY LG BORE MALE ST (IV SETS) IMPLANT
SUT ETHILON 3 0 PS 1 (SUTURE) ×5 IMPLANT
SUT GORETEX 5 0 TT13 24 (SUTURE) IMPLANT
SUT GORETEX 6.0 TT13 (SUTURE) IMPLANT
SUT MNCRL AB 4-0 PS2 18 (SUTURE) ×17 IMPLANT
SUT PROLENE 5 0 C 1 24 (SUTURE) ×7 IMPLANT
SUT PROLENE 6 0 BV (SUTURE) ×9 IMPLANT
SUT SILK 2 0 SH CR/8 (SUTURE) ×2 IMPLANT
SUT SILK 3 0 (SUTURE) ×5
SUT SILK 3-0 18XBRD TIE 12 (SUTURE) ×1 IMPLANT
SUT VIC AB 2-0 CT1 27 (SUTURE) ×15
SUT VIC AB 2-0 CT1 TAPERPNT 27 (SUTURE) ×9 IMPLANT
SUT VIC AB 3-0 SH 27 (SUTURE) ×20
SUT VIC AB 3-0 SH 27X BRD (SUTURE) ×13 IMPLANT
SWAB COLLECTION DEVICE MRSA (MISCELLANEOUS) ×2 IMPLANT
SWAB CULTURE ESWAB REG 1ML (MISCELLANEOUS) ×2 IMPLANT
SYR 20CC LL (SYRINGE) ×2 IMPLANT
SYR 3ML LL SCALE MARK (SYRINGE) ×2 IMPLANT
TAPE UMBILICAL 1/8X30 (MISCELLANEOUS) ×2 IMPLANT
TOWEL GREEN STERILE (TOWEL DISPOSABLE) ×10 IMPLANT
TRAY FOLEY MTR SLVR 16FR STAT (SET/KITS/TRAYS/PACK) ×5 IMPLANT
TUBING EXTENTION W/L.L. (IV SETS) IMPLANT
UNDERPAD 30X36 HEAVY ABSORB (UNDERPADS AND DIAPERS) ×5 IMPLANT
WATER STERILE IRR 1000ML POUR (IV SOLUTION) ×5 IMPLANT

## 2021-09-15 NOTE — Anesthesia Postprocedure Evaluation (Signed)
Anesthesia Post Note ? ?Patient: [name] ? ?Procedure(s) Performed: BYPASS GRAFT BELOW KNEE POPLITEAL ARTERY TO POSTERIOR TIBIAL ARTERY (Right: Leg Lower) ?AMPUTATION 4th TOE (Right: Toe) ?GREATER SAPHENOUS VEIN HARVEST (Right: Leg Upper) ? ?  ? ?Patient location during evaluation: PACU ?Anesthesia Type: General ?Level of consciousness: awake and alert and oriented ?Pain management: pain level controlled ?Vital Signs Assessment: post-procedure vital signs reviewed and stable ?Respiratory status: spontaneous breathing, nonlabored ventilation and respiratory function stable ?Cardiovascular status: blood pressure returned to baseline and stable ?Postop Assessment: no apparent nausea or vomiting ?Anesthetic complications: no ? ? ?No notable events documented. ? ?Last Vitals:  ?Vitals:  ? 09/15/21 1649 09/15/21 1725  ?BP: (!) 151/81 (!) 154/75  ?Pulse: 80 84  ?Resp: 17 19  ?Temp:  37.1 ?C  ?SpO2: 100% 99%  ?  ?Last Pain:  ?Vitals:  ? 09/15/21 1725  ?TempSrc: Oral  ?PainSc:   ? ? ?  ?  ?  ?  ?  ?  ? ?[name] ? ? ? ? ?

## 2021-09-15 NOTE — Transfer of Care (Signed)
Immediate Anesthesia Transfer of Care Note ? ?Patient: [name] ? ?Procedure(s) Performed: BYPASS GRAFT BELOW KNEE POPLITEAL ARTERY TO POSTERIOR TIBIAL ARTERY (Right: Leg Lower) ?AMPUTATION 4th TOE (Right: Toe) ?GREATER SAPHENOUS VEIN HARVEST (Right: Leg Upper) ? ?Patient Location: PACU ? ?Anesthesia Type:General ? ?Level of Consciousness: awake and alert  ? ?Airway & Oxygen Therapy: Patient Spontanous Breathing and Patient connected to face mask oxygen ? ?Post-op Assessment: Report given to RN and Post -op Vital signs reviewed and stable ? ?Post vital signs: Reviewed and stable ? ?Last Vitals:  ?Vitals Value Taken Time  ?BP 136/65 09/15/21 1449  ?Temp    ?Pulse 77 09/15/21 1449  ?Resp 17 09/15/21 1449  ?SpO2 95 % 09/15/21 1449  ?Vitals shown include unvalidated device data. ? ?Last Pain:  ?Vitals:  ? 09/15/21 1012  ?TempSrc: Oral  ?PainSc: 0-No pain  ?   ? ?Patients Stated Pain Goal: 2 (09/15/21 1012) ? ?Complications: No notable events documented. ?

## 2021-09-15 NOTE — Op Note (Signed)
? ? ?NAME: [name]    MRN: [id number] ?DOB: 1955/08/25    DATE OF OPERATION: 09/15/2021 ? ?PREOP DIAGNOSIS:   ? 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 ?POSTOP DIAGNOSIS:   ? ?Same ? ?PROCEDURE:  ?  ?Right leg below-knee popliteal artery to posterior tibial artery bypass using nonreversed greater saphenous vein ?Fourth metatarsal ray amputation, left open ? ?SURGEON: [name] ? ?ASSIST: Dr. Deitra Mayo, MD ? ?ANESTHESIA: General ? ?EBL: 200 mL ? ?INDICATIONS:  ? ? [name] is a 66 y.o. male 66 y.o. male is s/p drug-coated balloon angioplasty right SFA with progressive right lower extremity ischemic changes.  [name] has Rutherford 5 critical limb ischemia.  He will need bypass surgery in an effort to increase perfusion distally for wound healing.  The diabetic foot ulcer will need surgical debridement, fourth toe ray amputation. ?  ?I had a long discussion with Augusten regarding his right foot diabetic infection.  This is progressed significantly since Wednesday of last week.  I have looked at his vein mapping and discussed his case with my partners.  He would be best served with below-knee popliteal to posterior tibial artery bypass using saphenous vein.  He will also need formal debridement and fourth toe ray amputation, with plans to return to the OR for further debridement.  After discussing the risk and benefits, [name] elected to proceed. ? ?FINDINGS:  ? ?4 mm proximal greater saphenous vein tapering to 3 mm. ? ?TECHNIQUE:  ? ?Patient was brought to the OR laid in the supine position.  General anesthesia was induced and the patient was prepped and draped in standard fashion.  A timeout was performed. ? ?The case began with a longitudinal incision and standard exposure of the distal posterior tibial artery roughly 10 cm from the medial malleolus.  This was followed by standard exposure of the below-knee popliteal artery through another longitudinal incision.   The posterior tibial artery was exposed, but not controlled.  The below-knee popliteal artery was controlled with the use of Vesseloops.  Next, attention turned to saphenectomy.  The right greater saphenous vein was identified with the use of ultrasound.  Although relatively small, there appeared to be usable segment from the saphenofemoral junction to the mid thigh.  The saphenectomy was performed through skip incisions without issue.  The vein was placed in vein solution as well as diluted papaverine.  The vein was prepped in standard fashion.  Due to the tapering aspect of the vein, I elected to sew the vein in non-reversed fashion.  Blunt dissection was used to create a tunnel from the BK pop to the posterior tibial artery in the subfascial plane.  The patient was heparinized with 10,000 units IV heparin.  The popliteal artery was controlled and a longitudinal arteriotomy made.  The vein was sewn into side using running 5-0 Prolene suture.  Next, a hand-held valvulotome was brought onto the field and valves within the bypass lysed.  At completion, there was an excellent pulse within the bypass graft and pulsatile outflow.  Next, a uterine forcep was used to grab the bypass and tunneled to the posterior tibial artery.  At this point, a tourniquet was placed on the thigh and Esmarch wrap used to from the ankle proximally.  Once the tourniquet was inflated, a longitudinal arteriotomy was made on the posterior tibial artery.  The vein was cut to length and sewn in end to side fashion.  The posterior tibial artery diameter  at this location was roughly 2.5 mm. ?Upon finishing the bypass graft, there was an excellent multiphasic signal distally, which augmented significantly with bypass graft occlusion.  I felt that this was an excellent result. ?The incisions were irrigated with saline and closed using layers of Vicryl suture with Monocryl and Dermabond at the skin. ? ?Once all incisions were closed, my attention  turned to the foot.  There was significant demarcation with dermal necrosis on the dorsal aspect of the foot as well as the plantar aspect.  A scalpel was used to remove soft tissue that was demarcated.  This included a right fourth toe ray amputation.  Cultures were sent to microbiology.  Roughly half of the metatarsal was removed.  The wound was not closed due to active infection.  There was healthy bleeding in the wound bed.  A Betadine soaked gauze was placed in the wound bed, with plan for dressing changes daily. ? ?At case completion, the patient had an excellent multiphasic signal in the foot.  Devitalized tissue was excised along with fourth toe ray amputation. ? ?Given the complexity of the case,  the assistant was necessary in order to expedient the procedure and safely perform the technical aspects of the operation.  The assistant provided traction and countertraction to assist with exposure of the artery proximally and distally.  They assisted with suture ligature of multiple branches, and were pivotal in helping with the saphenectomy.  Their assistance was critical in the performance of both the proximal and distal anastomosis.These skills, especially following the Prolene suture for the anastomosis, could not have been adequately performed by a scrub tech assistant.   ? ? ?[name] Burows, MD ?[facility] ? ?DATE OF DICTATION:   09/15/2021 ? ?

## 2021-09-15 NOTE — Discharge Instructions (Signed)
 [facility]  Discharge instructions  Lower Extremity Bypass Surgery  Please refer to the following instruction for your post-procedure care. Your surgeon or physician assistant will discuss any changes with you.  Activity  You are encouraged to walk as much as you can. You can slowly return to normal activities during the month after your surgery. Avoid strenuous activity and heavy lifting until your doctor tells you it's OK. Avoid activities such as vacuuming or swinging a golf club. Do not drive until your doctor give the OK and you are no longer taking prescription pain medications. It is also normal to have difficulty with sleep habits, eating and bowel movement after surgery. These will go away with time.  Bathing/Showering  Shower daily after you go home. Do not soak in a bathtub, hot tub, or swim until the incision heals completely.  Incision Care  Clean your incision with mild soap and water. Shower every day. Pat the area dry with a clean towel. You do not need a bandage unless otherwise instructed. Do not apply any ointments or creams to your incision. If you have open wounds you will be instructed how to care for them or a visiting nurse may be arranged for you. If you have staples or sutures along your incision they will be removed at your post-op appointment. You may have skin glue on your incision. Do not peel it off. It will come off on its own in about one week.  Wash the groin wound with soap and water daily and pat dry. (No tub bath-only shower)  Then put a dry gauze or washcloth in the groin to keep this area dry to help prevent wound infection.  Do this daily and as needed.  Do not use Vaseline or neosporin on your incisions.  Only use soap and water on your incisions and then protect and keep dry.  Diet  Resume your normal diet. There are no special food restrictions following this procedure. A low fat/ low cholesterol diet is  recommended for all patients with vascular disease. In order to heal from your surgery, it is CRITICAL to get adequate nutrition. Your body requires vitamins, minerals, and protein. Vegetables are the best source of vitamins and minerals. Vegetables also provide the perfect balance of protein. Processed food has little nutritional value, so try to avoid this.  Medications  Resume taking all your medications unless your doctor or physician assistant tells you not to. If your incision is causing pain, you may take over-the-counter pain relievers such as acetaminophen (Tylenol). If you were prescribed a stronger pain medication, please aware these medication can cause nausea and constipation. Prevent nausea by taking the medication with a snack or meal. Avoid constipation by drinking plenty of fluids and eating foods with high amount of fiber, such as fruits, vegetables, and grains. Take Colace 100 mg (an over-the-counter stool softener) twice a day as needed for constipation.  Do not take Tylenol if you are taking prescription pain medications.  Follow Up  Our office will schedule a follow up appointment 2-3 weeks following discharge.  Please call us immediately for any of the following conditions  Severe or worsening pain in your legs or feet while at rest or while walking Increase pain, redness, warmth, or drainage (pus) from your incision site(s) Fever of 101 degree or higher The swelling in your leg with the bypass suddenly worsens and becomes more painful than when you were in the hospital If you have   been instructed to feel your graft pulse then you should do so every day. If you can no longer feel this pulse, call the office immediately. Not all patients are given this instruction.  Leg swelling is common after leg bypass surgery.  The swelling should improve over a few months following surgery. To improve the swelling, you may elevate your legs above the level of your heart while you are  sitting or resting. Your surgeon or physician assistant may ask you to apply an ACE wrap or wear compression (TED) stockings to help to reduce swelling.  Reduce your risk of vascular disease  Stop smoking. If you would like help call [facility] at 1-800-QUIT-NOW (1-800-784-8669) or [place] at [phone].  Manage your cholesterol Maintain a desired weight Control your diabetes weight Control your diabetes Keep your blood pressure down  If you have any questions, please call the office at [phone]  

## 2021-09-15 NOTE — Progress Notes (Signed)
?  Progress Note ? ? ? ?09/15/2021 ?8:07 AM ?* No surgery date entered * ? ?Subjective: No overnight issues ? ?Vitals:  ? 09/15/21 0626 09/15/21 0727  ?BP: (!) 148/72 (!) 148/70  ?Pulse: 92 91  ?Resp: 17 18  ?Temp: (!) 102.1 ?F (38.9 ?C) (!) 101.9 ?F (38.8 ?C)  ?SpO2: 100% 94%  ? ? ?Physical Exam: ?Awake alert oriented ?Palpable right popliteal pulse ?Stable gangrenous changes right foot ? ?CBC ?   ?Component Value Date/Time  ? WBC 24.0 (H) 09/15/2021 0449  ? RBC 4.51 09/15/2021 0449  ? HGB 12.5 (L) 09/15/2021 0449  ? HCT 38.0 (L) 09/15/2021 0449  ? PLT 525 (H) 09/15/2021 0449  ? MCV 84.3 09/15/2021 0449  ? MCH 27.7 09/15/2021 0449  ? MCHC 32.9 09/15/2021 0449  ? RDW 14.2 09/15/2021 0449  ? LYMPHSABS 2.7 09/15/2021 0449  ? MONOABS 2.9 (H) 09/15/2021 0449  ? EOSABS 0.1 09/15/2021 0449  ? BASOSABS 0.1 09/15/2021 0449  ? ? ?BMET ?   ?Component Value Date/Time  ? NA 136 09/15/2021 0449  ? K 4.1 09/15/2021 0449  ? CL 106 09/15/2021 0449  ? CO2 18 (L) 09/15/2021 0449  ? GLUCOSE 115 (H) 09/15/2021 0449  ? BUN 14 09/15/2021 0449  ? CREATININE 1.18 09/15/2021 0449  ? CALCIUM 8.3 (L) 09/15/2021 0449  ? GFRNONAA >60 09/15/2021 0449  ? GFRAA >60 02/17/2015 2030  ? ? ?INR ?No results found for: INR ? ? ?Intake/Output Summary (Last 24 hours) at 09/15/2021 0807 ?Last data filed at 09/15/2021 4854 ?Gross per 24 hour  ?Intake 2208.27 ml  ?Output 4175 ml  ?Net -1966.73 ml  ? ? ? ? ?Assessment:  66 y.o. male is s/p drug-coated balloon angioplasty right SFA with progressive right lower extremity ischemic changes.  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  He will need bypass surgery in an effort to increase perfusion distally for wound healing.  The diabetic foot ulcer will need surgical debridement, fourth toe ray amputation. ? ?Plan: ?I had a long discussion with Carlyn regarding his right foot diabetic infection.  This is progressed significantly since Wednesday of last week.  I have looked at his vein mapping and discussed his  case with my partners.  He would be best served with below-knee popliteal to posterior tibial artery bypass using saphenous vein.  He will also need formal debridement and fourth toe ray amputation, with plans to return to the OR for further debridement. ? ? ?I have discussed his case with Dr. Blenda Mounts, Podiatry who will be involved in his care  ? ? ?After discussing the risks and benefits, nausea elected to proceed. ? ? ? ?[name] MD ?[facility] ?Office: [phone] ?Pager: [phone] ? ?09/15/2021 ?8:07 AM ? ?

## 2021-09-15 NOTE — H&P (View-Only) (Signed)
?  Subjective:  ?Patient ID: [name], male    DOB: October 04, 1955,  MRN: [id number] ? ?Patient with past medical history of PVD, DM and neuropathy seen at beside today for right diabetic foot infection. Today underwent  ?Right leg below-knee popliteal artery to posterior tibial artery bypass using nonreversed greater saphenous vein ?Fourth metatarsal ray amputation, left open. Vascular reached out to discuss case earlier. Right foot was irrigated and debrided and fourth ray amputated and left open. Patient relates he is doing well and pain is well controlled. Denies nausea vomiting, fever and chills currently.  ? ?Past Medical History:  ?Diagnosis Date  ? Diabetes mellitus without complication ([place])   ? Hypertension   ? Neuropathy   ? Sleep apnea   ?  ? ?Past Surgical History:  ?Procedure Laterality Date  ? ABDOMINAL AORTOGRAM W/LOWER EXTREMITY Right 09/10/2021  ? Procedure: ABDOMINAL AORTOGRAM W/LOWER EXTREMITY;  Surgeon: [name], MD;  Location: [facility];  Service: Cardiovascular;  Laterality: Right;  ? ABDOMINAL HERNIA REPAIR    ? BACK SURGERY    ? KNEE SURGERY    ? PERIPHERAL VASCULAR BALLOON ANGIOPLASTY  09/10/2021  ? Procedure: PERIPHERAL VASCULAR BALLOON ANGIOPLASTY;  Surgeon: [name], MD;  Location: [facility];  Service: Cardiovascular;;  rt sfa pta  ? SHOULDER SURGERY    ? ? ?CBC Latest Ref Rng & Units 09/15/2021 09/14/2021 09/13/2021  ?WBC 4.0 - 10.5 K/uL 24.0(H) 20.3(H) 20.1(H)  ?Hemoglobin 13.0 - 17.0 g/dL 12.5(L) 11.7(L) 12.4(L)  ?Hematocrit 39.0 - 52.0 % 38.0(L) 35.3(L) 37.3(L)  ?Platelets 150 - 400 K/uL 525(H) 559(H) 490(H)  ? ? ?BMP Latest Ref Rng & Units 09/15/2021 09/14/2021 09/13/2021  ?Glucose 70 - 99 mg/dL 115(H) 134(H) 200(H)  ?BUN 8 - 23 mg/dL 14 19 27(H)  ?Creatinine 0.61 - 1.24 mg/dL 1.18 1.32(H) 2.14(H)  ?Sodium 135 - 145 mmol/L 136 133(L) 132(L)  ?Potassium 3.5 - 5.1 mmol/L 4.1 3.8 3.8  ?Chloride 98 - 111 mmol/L 106 102 97(L)  ?CO2 22 - 32 mmol/L 18(L) 20(L)  21(L)  ?Calcium 8.9 - 10.3 mg/dL 8.3(L) 8.1(L) 8.4(L)  ? ? ? ?Objective:  ? ?Vitals:  ? 09/15/21 1649 09/15/21 1725  ?BP: (!) 151/81 (!) 154/75  ?Pulse: 80 84  ?Resp: 17 19  ?Temp:  98.7 ?F (37.1 ?C)  ?SpO2: 100% 99%  ? ? ?General:AA&O x 3. Normal mood and affect  ? ?Vascular: DP and PT pulses 2/4 bilateral. Brisk capillary refill to all digits. Pedal hair present  ? ?Neruological. Epicritic sensation grossly intact.  ? ?Derm: Currently foot wound dressed no strike through noted. Photos reviewed from OR  ? ? ? ? ? ? ?MSK: MMT 5/5 in dorsiflexion, plantar flexion, inversion and eversion. Normal joint ROM without pain or crepitus.  ? ? ? ? ?Assessment & Plan:  ?Patient was evaluated and treated and all questions answered. ? ?DX: Right diabetic foot infection s/p bypass ?Wound care: Betadine WTD dressing daily.  ?Antibiotics: Per primary  ?DME: surgical shoe ?Discussed with patient diagnosis and treatment options.  ?Imaging reviewed and media reviewed.  ?Discussed with patient staged procedure for this foot infection. Discussed return to the OR for this left foot for further debridement   ?Patient in agreement with plan and all questions answered.  ?Will plan to return Wednesday afternoon for repeat debridement and possible partial closure.  ?Patient NPO after midnight on Tuesday.  ? ?Lorenda Peck, MD ? ?Accessible via secure chat for questions or concerns. ? ?

## 2021-09-15 NOTE — Consult Note (Signed)
?  Subjective:  ?Patient ID: [name], male    DOB: 1956/02/08,  MRN: [id number] ? ?Patient with past medical history of PVD, DM and neuropathy seen at beside today for right diabetic foot infection. Today underwent  ?Right leg below-knee popliteal artery to posterior tibial artery bypass using nonreversed greater saphenous vein ?Fourth metatarsal ray amputation, left open. Vascular reached out to discuss case earlier. Right foot was irrigated and debrided and fourth ray amputated and left open. Patient relates he is doing well and pain is well controlled. Denies nausea vomiting, fever and chills currently.  ? ?Past Medical History:  ?Diagnosis Date  ? Diabetes mellitus without complication ([place])   ? Hypertension   ? Neuropathy   ? Sleep apnea   ?  ? ?Past Surgical History:  ?Procedure Laterality Date  ? ABDOMINAL AORTOGRAM W/LOWER EXTREMITY Right 09/10/2021  ? Procedure: ABDOMINAL AORTOGRAM W/LOWER EXTREMITY;  Surgeon: [name], MD;  Location: [facility];  Service: Cardiovascular;  Laterality: Right;  ? ABDOMINAL HERNIA REPAIR    ? BACK SURGERY    ? KNEE SURGERY    ? PERIPHERAL VASCULAR BALLOON ANGIOPLASTY  09/10/2021  ? Procedure: PERIPHERAL VASCULAR BALLOON ANGIOPLASTY;  Surgeon: [name], MD;  Location: [facility];  Service: Cardiovascular;;  rt sfa pta  ? SHOULDER SURGERY    ? ? ?CBC Latest Ref Rng & Units 09/15/2021 09/14/2021 09/13/2021  ?WBC 4.0 - 10.5 K/uL 24.0(H) 20.3(H) 20.1(H)  ?Hemoglobin 13.0 - 17.0 g/dL 12.5(L) 11.7(L) 12.4(L)  ?Hematocrit 39.0 - 52.0 % 38.0(L) 35.3(L) 37.3(L)  ?Platelets 150 - 400 K/uL 525(H) 559(H) 490(H)  ? ? ?BMP Latest Ref Rng & Units 09/15/2021 09/14/2021 09/13/2021  ?Glucose 70 - 99 mg/dL 115(H) 134(H) 200(H)  ?BUN 8 - 23 mg/dL 14 19 27(H)  ?Creatinine 0.61 - 1.24 mg/dL 1.18 1.32(H) 2.14(H)  ?Sodium 135 - 145 mmol/L 136 133(L) 132(L)  ?Potassium 3.5 - 5.1 mmol/L 4.1 3.8 3.8  ?Chloride 98 - 111 mmol/L 106 102 97(L)  ?CO2 22 - 32 mmol/L 18(L) 20(L)  21(L)  ?Calcium 8.9 - 10.3 mg/dL 8.3(L) 8.1(L) 8.4(L)  ? ? ? ?Objective:  ? ?Vitals:  ? 09/15/21 1649 09/15/21 1725  ?BP: (!) 151/81 (!) 154/75  ?Pulse: 80 84  ?Resp: 17 19  ?Temp:  98.7 ?F (37.1 ?C)  ?SpO2: 100% 99%  ? ? ?General:AA&O x 3. Normal mood and affect  ? ?Vascular: DP and PT pulses 2/4 bilateral. Brisk capillary refill to all digits. Pedal hair present  ? ?Neruological. Epicritic sensation grossly intact.  ? ?Derm: Currently foot wound dressed no strike through noted. Photos reviewed from OR  ? ? ? ? ? ? ?MSK: MMT 5/5 in dorsiflexion, plantar flexion, inversion and eversion. Normal joint ROM without pain or crepitus.  ? ? ? ? ?Assessment & Plan:  ?Patient was evaluated and treated and all questions answered. ? ?DX: Right diabetic foot infection s/p bypass ?Wound care: Betadine WTD dressing daily.  ?Antibiotics: Per primary  ?DME: surgical shoe ?Discussed with patient diagnosis and treatment options.  ?Imaging reviewed and media reviewed.  ?Discussed with patient staged procedure for this foot infection. Discussed return to the OR for this left foot for further debridement   ?Patient in agreement with plan and all questions answered.  ?Will plan to return Wednesday afternoon for repeat debridement and possible partial closure.  ?Patient NPO after midnight on Tuesday.  ? ?Lorenda Peck, MD ? ?Accessible via secure chat for questions or concerns. ? ?

## 2021-09-15 NOTE — Anesthesia Preprocedure Evaluation (Addendum)
Anesthesia Evaluation  ?Patient identified by MRN, date of birth, ID band ?Patient awake ? ? ? ?Reviewed: ?Allergy & Precautions, NPO status , Patient's Chart, lab work & pertinent test results, reviewed documented beta blocker date and time  ? ?Airway ?Mallampati: II ? ?TM Distance: >3 FB ?Neck ROM: Full ? ? ? Dental ? ?(+) Poor Dentition, Chipped, Edentulous Upper,  ?  ?Pulmonary ?sleep apnea and Continuous Positive Airway Pressure Ventilation , former smoker,  ?  ?Pulmonary exam normal ?breath sounds clear to auscultation ? ? ? ? ? ? Cardiovascular ?hypertension, Pt. on medications and Pt. on home beta blockers ?+ Peripheral Vascular Disease  ?Normal cardiovascular exam ?Rhythm:Regular Rate:Normal ? ?Chronic right lower extremity ischemia ? ?EKG 08/28/21 ?NSR, Right and Left limb lead reversal, ? Inferior MI ?  ?Neuro/Psych ?PSYCHIATRIC DISORDERS Depression Diabetic neuropathy ? Neuromuscular disease   ? GI/Hepatic ?Neg liver ROS, GERD  Medicated and Controlled,  ?Endo/Other  ?diabetes, Poorly Controlled, Type 2, Oral Hypoglycemic Agents, Insulin DependentHyperlipidemia ?Obesity ? Renal/GU ?Renal diseaseHx/o AKI  ?negative genitourinary ?  ?Musculoskeletal ?Gangrene right foot  ? Abdominal ?(+) + obese,   ?Peds ? Hematology ?Plavix therapy- last dose ?   ?Anesthesia Other Findings ? ? Reproductive/Obstetrics ?ED ? ? ?  ? ? ? ? ? ? ? ? ? ? ? ? ? ?  ?  ? ? ? ? ? ? ?Anesthesia Physical ?Anesthesia Plan ? ?ASA: 3 ? ?Anesthesia Plan: General  ? ?Post-op Pain Management: Minimal or no pain anticipated, Gabapentin PO (pre-op)* and Precedex  ? ?Induction: Intravenous ? ?PONV Risk Score and Plan: 3 ? ?Airway Management Planned: Oral ETT ? ?Additional Equipment:  ? ?Intra-op Plan:  ? ?Post-operative Plan: Extubation in OR ? ?Informed Consent: I have reviewed the patients History and Physical, chart, labs and discussed the procedure including the risks, benefits and alternatives for the  proposed anesthesia with the patient or authorized representative who has indicated his/her understanding and acceptance.  ? ? ? ?Dental advisory given ? ?Plan Discussed with: CRNA and Anesthesiologist ? ?Anesthesia Plan Comments:   ? ? ? ? ? ? ?Anesthesia Quick Evaluation ? ?

## 2021-09-15 NOTE — Progress Notes (Signed)
orders

## 2021-09-15 NOTE — Anesthesia Procedure Notes (Signed)
Arterial Line Insertion ?Start/End3/13/2023 11:36 AM, 09/15/2021 11:39 AM ?Performed by: Josephine Igo, MD, Janace Litten, CRNA, CRNA ? Patient location: OR. ?Preanesthetic checklist: patient identified, IV checked, site marked, risks and benefits discussed, surgical consent, monitors and equipment checked, pre-op evaluation, timeout performed and anesthesia consent ?Left, radial was placed ?Catheter size: 20 G ?Maximum sterile barriers used  ? ?Attempts: 1 ?Procedure performed without using ultrasound guided technique. ?Following insertion, dressing applied and Biopatch. ?Post procedure assessment: normal ? ?Patient tolerated the procedure well with no immediate complications. ? ? ?

## 2021-09-15 NOTE — Consult Note (Signed)
[facility] Nurse wound consult note ?Consultation was completed by review of records, images and assistance from the bedside nurse/clinical staff.  ? ? ?Reason for Consult:right foot wound ?Seen by [facility] for gangrenous changes of the right foot. No wound care orders entered, I will add for now until surgical intervention can take place.  ?Wound type: DM with PAD, ulceration right plantar 3rd toe, with discoloration of the 3rd toe and blood filled blister dorsal right foot (also at 3rd toe) ?Pressure Injury POA: YNA ?Measurement: see nursing flowsheet ?Wound EHO:[id number] wound is 100% necrotic  ?Drainage (amount, consistency, odor) not assessed  ?Periwound: edema  ?Dressing procedure/placement/frequency:  ?Enzymatic debridement ointment ordered for plantar surface wound; plans for revascularization and 3rd toe amputation 09/16/21.  Single layer of xeroform gauze to the intact bulla.  ? ?Re consult if needed, will not follow at this time. ?Thanks ? Bernis Schreur [facility] MSN, RN,CWOCN, CNS, CWON-AP [phone])  ?

## 2021-09-15 NOTE — Progress Notes (Addendum)
Pt arrived to 4E from PACU. CHG bath done. Tele applied CCMD called. A&Ox4. VSS. RLE and R groin incisions c/d/I; all other skin intact. Pt rates pain 8/10. Pt oriented to unit. Call light in reach.  ? ?Raelyn Number, RN ? ?

## 2021-09-15 NOTE — Progress Notes (Signed)
PROGRESS NOTE    [name]  PQZ:[id number] DOB: 12-06-1955 DOA: 09/12/2021 PCP: Harvie Junior, MD    Brief Narrative:  66 year old gentleman with history of type 2 diabetes on insulin, essential hypertension, peripheral neuropathy and obstructive sleep apnea presented with right foot redness progressed to blistering on the dorsum of the right foot and associated spreading inflammation rapidly progressing within 24 hours.  Patient does have history of nonhealing ulcer on the sole of the right foot.  Patient was seen by vascular surgery as outpatient and underwent right leg angiography and angioplasty by Dr. Unk Lightning on 3/8 for same.  In the emergency room patient with evidence of acute kidney injury, leukocytosis with WBC count 21.8 and thrombocytosis.  Blood cultures drawn.  Started on broad-spectrum antibiotics and admitted to the hospital. Patient was already scheduled to have surgery/ Bypass ? next week.   Assessment & Plan:   Cellulitis and abscess of the right foot, diabetic foot infection, sepsis present on admission with leukocytosis, tachycardia and acute renal failure: Patient was started on IV vancomycin and cefepime for severe nonpurulent cellulitis.  Blood cultures negative so far.  Seen by vascular surgery.  They have involved podiatry as well.  They are going to do the bypass right lower extremity and recommend below-knee amputation.  Acute kidney injury: Recent known normal renal function 1.08-1.2.  Creatinine 2.14 on  presentation. Consistent with ATN from sepsis.  Resolved and renal function back to normal now.  Essential hypertension: At risk of hypotension.  Holding off ACE inhibitor and hydrochlorothiazide given acute kidney injury.  Continue beta-blockers.  As needed IV labetalol.  Type 2 diabetes, uncontrolled with hyperglycemia.  Complicated with peripheral neuropathy: Started on basal and sliding scale insulin.  Keeping on low-dose insulin, going to be n.p.o.  past midnight.  Obstructive sleep apnea: Not using CPAP at home as he could not tolerate that.  GERD: On PPI.  Continue.   DVT prophylaxis: enoxaparin (LOVENOX) injection 40 mg Start: 09/13/21 1000   Code Status: Full code Family Communication: None at the bedside. Disposition Plan: Status is: Inpatient Remains inpatient appropriate because: Severe leg infection.  Ischemia.  Consultants:  Vascular surgery  Procedures:    Antimicrobials:  Vancomycin and cefepime 3/10----   Subjective:  Seen and examined.  No complaints except mild right foot pain.  Objective: Vitals:   09/15/21 0626 09/15/21 0727 09/15/21 0927 09/15/21 1012  BP: (!) 148/72 (!) 148/70  (!) 141/65  Pulse: 92 91  89  Resp: '17 18  17  '$ Temp: (!) 102.1 F (38.9 C) (!) 101.9 F (38.8 C) 100.2 F (37.9 C) 100.3 F (37.9 C)  TempSrc: Oral Oral Oral Oral  SpO2: 100% 94%  96%  Weight:      Height:        Intake/Output Summary (Last 24 hours) at 09/15/2021 1114 Last data filed at 09/15/2021 0818 Gross per 24 hour  Intake 1868.27 ml  Output 3625 ml  Net -1756.73 ml    Filed Weights   09/12/21 2256 09/14/21 0442 09/14/21 2018  Weight: 106.1 kg 102.6 kg 102 kg    Examination:  General exam: Appears calm and comfortable  Respiratory system: Clear to auscultation. Respiratory effort normal. Cardiovascular system: S1 & S2 heard, RRR. No JVD, murmurs, rubs, gallops or clicks. No pedal edema. Gastrointestinal system: Abdomen is nondistended, soft and nontender. No organomegaly or masses felt. Normal bowel sounds heard. Central nervous system: Alert and oriented. No focal neurological deficits. Extremities: Dressing in the right  foot. Psychiatry: Judgement and insight appear normal. Mood & affect appropriate.    Data Reviewed: I have personally reviewed following labs and imaging studies  CBC: Recent Labs  Lab 09/10/21 0813 09/12/21 2309 09/13/21 0303 09/14/21 0058 09/15/21 0449  WBC  --  21.8*  20.1* 20.3* 24.0*  NEUTROABS  --   --   --  15.2* 17.9*  HGB 14.6 13.1 12.4* 11.7* 12.5*  HCT 43.0 40.3 37.3* 35.3* 38.0*  MCV  --  85.7 85.9 84.4 84.3  PLT  --  623* 490* 559* 525*    Basic Metabolic Panel: Recent Labs  Lab 09/10/21 0813 09/12/21 2309 09/13/21 0303 09/14/21 0058 09/15/21 0449  NA 137 132* 132* 133* 136  K 3.5 4.0 3.8 3.8 4.1  CL 101 96* 97* 102 106  CO2  --  21* 21* 20* 18*  GLUCOSE 104* 257* 200* 134* 115*  BUN 23 25* 27* 19 14  CREATININE 1.20 1.85* 2.14* 1.32* 1.18  CALCIUM  --  8.8* 8.4* 8.1* 8.3*  MG  --   --   --  2.0  --   PHOS  --   --   --  2.3*  --     GFR: Estimated Creatinine Clearance: 74.1 mL/min (by C-G formula based on SCr of 1.18 mg/dL). Liver Function Tests: Recent Labs  Lab 09/12/21 2309  AST 27  ALT 32  ALKPHOS 83  BILITOT 0.6  PROT 8.0  ALBUMIN 3.0*    No results for input(s): LIPASE, AMYLASE in the last 168 hours. No results for input(s): AMMONIA in the last 168 hours. Coagulation Profile: No results for input(s): INR, PROTIME in the last 168 hours. Cardiac Enzymes: No results for input(s): CKTOTAL, CKMB, CKMBINDEX, TROPONINI in the last 168 hours. BNP (last 3 results) No results for input(s): PROBNP in the last 8760 hours. HbA1C: Recent Labs    09/13/21 0303  HGBA1C 7.6*   CBG: Recent Labs  Lab 09/14/21 1641 09/14/21 2129 09/15/21 0726 09/15/21 0923 09/15/21 1112  GLUCAP 204* 154* 122* 110* 91    Lipid Profile: No results for input(s): CHOL, HDL, LDLCALC, TRIG, CHOLHDL, LDLDIRECT in the last 72 hours. Thyroid Function Tests: No results for input(s): TSH, T4TOTAL, FREET4, T3FREE, THYROIDAB in the last 72 hours. Anemia Panel: No results for input(s): VITAMINB12, FOLATE, FERRITIN, TIBC, IRON, RETICCTPCT in the last 72 hours. Sepsis Labs: Recent Labs  Lab 09/13/21 0053 09/13/21 0210  LATICACIDVEN 1.8 1.4     Recent Results (from the past 240 hour(s))  Resp Panel by RT-PCR (Flu A&B, Covid)      Status: None   Collection Time: 09/13/21 12:29 AM   Specimen: Nasopharyngeal(NP) swabs in vial transport medium  Result Value Ref Range Status   SARS Coronavirus 2 by RT PCR NEGATIVE NEGATIVE Final    Comment: (NOTE) SARS-CoV-2 target nucleic acids are NOT DETECTED.  The SARS-CoV-2 RNA is generally detectable in upper respiratory specimens during the acute phase of infection. The lowest concentration of SARS-CoV-2 viral copies this assay can detect is 138 copies/mL. A negative result does not preclude SARS-Cov-2 infection and should not be used as the sole basis for treatment or other patient management decisions. A negative result may occur with  improper specimen collection/handling, submission of specimen other than nasopharyngeal swab, presence of viral mutation(s) within the areas targeted by this assay, and inadequate number of viral copies(<138 copies/mL). A negative result must be combined with clinical observations, patient history, and epidemiological information. The expected result is Negative.  Fact Sheet for Patients:  EntrepreneurPulse.com.au  Fact Sheet for Healthcare Providers:  IncredibleEmployment.be  This test is no t yet approved or cleared by the [place] FDA and  has been authorized for detection and/or diagnosis of SARS-CoV-2 by FDA under an Emergency Use Authorization (EUA). This EUA will remain  in effect (meaning this test can be used) for the duration of the COVID-19 declaration under Section 564(b)(1) of the Act, 21 U.S.C.section 360bbb-3(b)(1), unless the authorization is terminated  or revoked sooner.       Influenza A by PCR NEGATIVE NEGATIVE Final   Influenza B by PCR NEGATIVE NEGATIVE Final    Comment: (NOTE) The Xpert Xpress SARS-CoV-2/FLU/RSV plus assay is intended as an aid in the diagnosis of influenza from Nasopharyngeal swab specimens and should not be used as a sole basis for treatment. Nasal  washings and aspirates are unacceptable for Xpert Xpress SARS-CoV-2/FLU/RSV testing.  Fact Sheet for Patients: EntrepreneurPulse.com.au  Fact Sheet for Healthcare Providers: IncredibleEmployment.be  This test is not yet approved or cleared by the [place] FDA and has been authorized for detection and/or diagnosis of SARS-CoV-2 by FDA under an Emergency Use Authorization (EUA). This EUA will remain in effect (meaning this test can be used) for the duration of the COVID-19 declaration under Section 564(b)(1) of the Act, 21 U.S.C. section 360bbb-3(b)(1), unless the authorization is terminated or revoked.  Performed at [facility], [place] [address], [place], [place] [address]   Culture, blood (Routine X 2) w Reflex to ID Panel     Status: None (Preliminary result)   Collection Time: 09/13/21 12:53 AM   Specimen: BLOOD RIGHT ARM  Result Value Ref Range Status   Specimen Description BLOOD RIGHT ARM  Final   Special Requests   Final    BOTTLES DRAWN AEROBIC AND ANAEROBIC Blood Culture results may not be optimal due to an inadequate volume of blood received in culture bottles   Culture   Final    NO GROWTH 2 DAYS Performed at [facility], [place] [address] [place], [place] [address]    Report Status PENDING  Incomplete  Culture, blood (Routine X 2) w Reflex to ID Panel     Status: None (Preliminary result)   Collection Time: 09/13/21  1:01 AM   Specimen: BLOOD RIGHT ARM  Result Value Ref Range Status   Specimen Description BLOOD RIGHT ARM  Final   Special Requests   Final    BOTTLES DRAWN AEROBIC AND ANAEROBIC Blood Culture adequate volume   Culture   Final    NO GROWTH 2 DAYS Performed at [facility], [place] [address] [place], [place] [address]    Report Status PENDING  Incomplete  Surgical pcr screen     Status: None   Collection Time: 09/15/21  2:52 AM   Specimen: Nasal Mucosa; Nasal Swab  Result Value Ref Range Status    MRSA, PCR NEGATIVE NEGATIVE Final   Staphylococcus aureus NEGATIVE NEGATIVE Final    Comment: (NOTE) The Xpert SA Assay (FDA approved for NASAL specimens in patients 45 years of age and older), is one component of a comprehensive surveillance program. It is not intended to diagnose infection nor to guide or monitor treatment. Performed at [facility], [place] [address] [place], [place] [address]           Radiology Studies: VAS [place] ABI WITH/WO TBI  Result Date: 09/14/2021  LOWER EXTREMITY DOPPLER STUDY Patient Name:  [name]  Date of Exam:   09/14/2021 Medical Rec #: [id number]          Accession #:    [id number] Date of Birth: 1956/05/16           Patient Gender: M Patient Age:   51 years Exam Location:  [facility] Procedure:      VAS [place] ABI WITH/WO TBI Referring Phys: Servando Snare --------------------------------------------------------------------------------  Indications: Ulceration, gangrene, and peripheral artery disease. High Risk         Hypertension, hyperlipidemia, Diabetes, past history of Factors:          smoking. Other Factors: OSA.  Vascular Interventions: PERIPHERAL VASCULAR BALLOON ANGIOPLASTY 09/10/2021.  Comparison Study: Prior ABI 08/28/21 Performing Technologist: Sharion Dove RVS  Examination Guidelines: A complete evaluation includes at minimum, Doppler waveform signals and systolic blood pressure reading at the level of bilateral brachial, anterior tibial, and posterior tibial arteries, when vessel segments are accessible. Bilateral testing is considered an integral part of a complete examination. Photoelectric Plethysmograph (PPG) waveforms and toe systolic pressure readings are included as required and additional duplex testing as needed. Limited examinations for reoccurring indications may be performed as noted.  ABI Findings: +---------+------------------+-----+-------------------+--------+  Right     Rt Pressure (mmHg) Index Waveform             Comment   +---------+------------------+-----+-------------------+--------+  Brachial  131                      multiphasic                   +---------+------------------+-----+-------------------+--------+  PTA       77                 0.55  dampened monophasic           +---------+------------------+-----+-------------------+--------+  DP        0                  0.00  absent                        +---------+------------------+-----+-------------------+--------+  Great Toe                          Absent                        +---------+------------------+-----+-------------------+--------+ +---------+------------------+-----+-------------------+-------+  Left      Lt Pressure (mmHg) Index Waveform            Comment  +---------+------------------+-----+-------------------+-------+  Brachial  139                      multiphasic                  +---------+------------------+-----+-------------------+-------+  PTA       40                 0.29  dampened monophasic          +---------+------------------+-----+-------------------+-------+  DP        0                  0.00  absent                       +---------+------------------+-----+-------------------+-------+  Great Toe  Absent                       +---------+------------------+-----+-------------------+-------+ +-------+-----------+-----------+------------+------------+  ABI/TBI Today's ABI Today's TBI Previous ABI Previous TBI  +-------+-----------+-----------+------------+------------+  Right   0.55        absent      0.50         absent        +-------+-----------+-----------+------------+------------+  Left    0.29        absent      0.28         absent        +-------+-----------+-----------+------------+------------+ Bilateral ABIs and TBIs appear essentially unchanged compared to prior study on 08/28/21.  Summary: Right: Resting right ankle-brachial index indicates moderate right lower extremity arterial disease. The  right toe-brachial index is abnormal. Left: Resting left ankle-brachial index indicates critical left limb ischemia. The left toe-brachial index is abnormal.  *See table(s) above for measurements and observations.  Electronically signed by Servando Snare MD on 09/14/2021 at 4:19:25 PM.    Final    VAS [place] LOWER EXTREMITY SAPHENOUS VEIN MAPPING  Result Date: 09/14/2021 LOWER EXTREMITY VEIN MAPPING Patient Name:  WILLMAN CUNY  Date of Exam:   09/14/2021 Medical Rec #: [id number]          Accession #:    [id number] Date of Birth: 1955-08-21           Patient Gender: M Patient Age:   21 years Exam Location[facility]  [facility] Procedure:      VAS [place] LOWER EXTREMITY SAPHENOUS VEIN MAPPING Referring Phys: Servando Snare --------------------------------------------------------------------------------  Indications:  Pre-op, and ulceration Risk Factors: Hypertension, hyperlipidemia, Diabetes, past history of smoking.  Comparison Study: No prior study Performing Technologist: Sharion Dove RVS  Examination Guidelines: A complete evaluation includes B-mode imaging, spectral Doppler, color Doppler, and power Doppler as needed of all accessible portions of each vessel. Bilateral testing is considered an integral part of a complete examination. Limited examinations for reoccurring indications may be performed as noted. +---------------+-----------+----------------------+---------------+-----------+    RT Diameter   RT Findings          GSV             LT Diameter   LT Findings        (cm)                                               (cm)                    +---------------+-----------+----------------------+---------------+-----------+       0.40                       Saphenofemoral          0.48                                                        Junction                                     +---------------+-----------+----------------------+---------------+-----------+  0.30                       Proximal thigh           0.32                    +---------------+-----------+----------------------+---------------+-----------+       0.22                         Mid thigh             0.34                    +---------------+-----------+----------------------+---------------+-----------+       0.23                        Distal thigh           0.21                    +---------------+-----------+----------------------+---------------+-----------+       0.20                            Knee               0.21                    +---------------+-----------+----------------------+---------------+-----------+       0.23                         Prox calf             0.25        branching   +---------------+-----------+----------------------+---------------+-----------+       0.36        branching         Mid calf             0.16        branching   +---------------+-----------+----------------------+---------------+-----------+       0.35                        Distal calf            0.25                    +---------------+-----------+----------------------+---------------+-----------+       0.32                           Ankle               0.28                    +---------------+-----------+----------------------+---------------+-----------+ Diagnosing physician: Servando Snare MD Electronically signed by Servando Snare MD on 09/14/2021 at 4:19:10 PM.    Final         Scheduled Meds:  [MAR Hold] atorvastatin  80 mg Oral Daily   [MAR Hold] buPROPion  300 mg Oral Daily   [MAR Hold] cholecalciferol  2,000 Units Oral BID   [MAR Hold] collagenase   Topical Daily   [MAR Hold] donepezil  5 mg Oral Daily   [MAR Hold] enoxaparin (LOVENOX) injection  40 mg Subcutaneous Daily   [MAR Hold] fluticasone furoate-vilanterol  1 puff Inhalation Daily   [MAR Hold] gabapentin  600 mg Oral TID   [MAR Hold] insulin aspart  0-15 Units Subcutaneous TID AC & HS   [MAR Hold] insulin glargine-yfgn  10 Units Subcutaneous BID   [MAR Hold] methocarbamol   500 mg Oral TID AC & HS   [MAR Hold] metoprolol tartrate  50 mg Oral BID   [MAR Hold] OLANZapine  2.5 mg Oral QHS   [MAR Hold] OXcarbazepine  150 mg Oral BID   [MAR Hold] pantoprazole  40 mg Oral Daily   [MAR Hold] vancomycin variable dose per unstable renal function (pharmacist dosing)   Does not apply See admin instructions   Continuous Infusions:  sodium chloride 100 mL/hr at 09/14/21 1700   [MAR Hold] ceFEPime (MAXIPIME) IV 2 g (09/15/21 0525)   lactated ringers 10 mL/hr at 09/15/21 1024   [MAR Hold] metronidazole 500 mg (09/15/21 0626)     LOS: 2 days    Time spent: 30 minutes     Darliss Cheney, MD [facility] Pager [phone]

## 2021-09-15 NOTE — Anesthesia Procedure Notes (Addendum)
Procedure Name: Intubation ?Date/Time: 09/15/2021 11:33 AM ?Performed by: Inda Coke, CRNA ?Pre-anesthesia Checklist: Patient identified, Emergency Drugs available, Suction available, Patient being monitored and Timeout performed ?Patient Re-evaluated:Patient Re-evaluated prior to induction ?Oxygen Delivery Method: Circle system utilized ?Preoxygenation: Pre-oxygenation with 100% oxygen ?Induction Type: IV induction ?Ventilation: Mask ventilation without difficulty ?Laryngoscope Size: Mac and 4 ?Grade View: Grade II ?Tube type: Oral ?Tube size: 7.5 mm ?Number of attempts: 1 ?Airway Equipment and Method: Stylet ?Placement Confirmation: ETT inserted through vocal cords under direct vision, positive ETCO2 and breath sounds checked- equal and bilateral ?Secured at: 22 cm ?Tube secured with: Tape ?Dental Injury: Teeth and Oropharynx as per pre-operative assessment  ?Comments: Intubated by Everlene Other SRNA ? ? ? ? ?

## 2021-09-16 ENCOUNTER — Encounter (HOSPITAL_COMMUNITY): Payer: Self-pay | Admitting: Family Medicine

## 2021-09-16 DIAGNOSIS — E11628 Type 2 diabetes mellitus with other skin complications: Secondary | ICD-10-CM | POA: Diagnosis not present

## 2021-09-16 DIAGNOSIS — L03119 Cellulitis of unspecified part of limb: Secondary | ICD-10-CM | POA: Diagnosis not present

## 2021-09-16 LAB — GLUCOSE, CAPILLARY
Glucose-Capillary: 120 mg/dL — ABNORMAL HIGH (ref 70–99)
Glucose-Capillary: 142 mg/dL — ABNORMAL HIGH (ref 70–99)
Glucose-Capillary: 142 mg/dL — ABNORMAL HIGH (ref 70–99)
Glucose-Capillary: 154 mg/dL — ABNORMAL HIGH (ref 70–99)

## 2021-09-16 LAB — CBC WITH DIFFERENTIAL/PLATELET
Abs Immature Granulocytes: 0.3 10*3/uL — ABNORMAL HIGH (ref 0.00–0.07)
Basophils Absolute: 0.1 10*3/uL (ref 0.0–0.1)
Basophils Relative: 0 %
Eosinophils Absolute: 0 10*3/uL (ref 0.0–0.5)
Eosinophils Relative: 0 %
HCT: 35.4 % — ABNORMAL LOW (ref 39.0–52.0)
Hemoglobin: 11.3 g/dL — ABNORMAL LOW (ref 13.0–17.0)
Immature Granulocytes: 2 %
Lymphocytes Relative: 11 %
Lymphs Abs: 2.2 10*3/uL (ref 0.7–4.0)
MCH: 27.8 pg (ref 26.0–34.0)
MCHC: 31.9 g/dL (ref 30.0–36.0)
MCV: 87 fL (ref 80.0–100.0)
Monocytes Absolute: 2.3 10*3/uL — ABNORMAL HIGH (ref 0.1–1.0)
Monocytes Relative: 11 %
Neutro Abs: 15.6 10*3/uL — ABNORMAL HIGH (ref 1.7–7.7)
Neutrophils Relative %: 76 %
Platelets: 589 10*3/uL — ABNORMAL HIGH (ref 150–400)
RBC: 4.07 MIL/uL — ABNORMAL LOW (ref 4.22–5.81)
RDW: 14.6 % (ref 11.5–15.5)
WBC: 20.5 10*3/uL — ABNORMAL HIGH (ref 4.0–10.5)
nRBC: 0 % (ref 0.0–0.2)

## 2021-09-16 LAB — LIPID PANEL
Cholesterol: 72 mg/dL (ref 0–200)
HDL: <10 mg/dL — ABNORMAL LOW (ref >40)
Triglycerides: 105 mg/dL (ref <150)
VLDL: 21 mg/dL (ref 0–40)

## 2021-09-16 LAB — BASIC METABOLIC PANEL
Anion gap: 11 (ref 5–15)
BUN: 16 mg/dL (ref 8–23)
CO2: 17 mmol/L — ABNORMAL LOW (ref 22–32)
Calcium: 8 mg/dL — ABNORMAL LOW (ref 8.9–10.3)
Chloride: 108 mmol/L (ref 98–111)
Creatinine, Ser: 1.08 mg/dL (ref 0.61–1.24)
GFR, Estimated: >60 mL/min (ref >60)
Glucose, Bld: 104 mg/dL — ABNORMAL HIGH (ref 70–99)
Potassium: 4.6 mmol/L (ref 3.5–5.1)
Sodium: 136 mmol/L (ref 135–145)

## 2021-09-16 MED ORDER — VANCOMYCIN HCL 1500 MG/300ML IV SOLN
1500.0000 mg | INTRAVENOUS | Status: DC
Start: 2021-09-16 — End: 2021-09-18
  Administered 2021-09-16 – 2021-09-17 (×2): 1500 mg via INTRAVENOUS
  Filled 2021-09-16 (×3): qty 300

## 2021-09-16 NOTE — Evaluation (Signed)
3Physical Therapy Evaluation ?Patient Details ?Name: [name] ?MRN: [id number] ?DOB: 09-08-1955 ?Today's Date: 09/16/2021 ? ?History of Present Illness ? Patient is a 66 y/o male who presents on 09/13/21 due to right foot infection. 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 Second surgery likely 09/17/21. PMH includes DM, HTN, sleep apnea.  ?Clinical Impression ? Patient presents with RLE pain and post surgical deficits, impaired balance and impaired mobility s/p above. Pt lives at home with fiance and reports being Mod I using SPC for ambulation PTA. Today, pt requires Max A for bed mobility and Mod A of 2 for standing and step pivot transfer to/from [facility]. Not able to maintain NWB, but able to bear weight through heel only with use of RW. Mobility limited to transfer per vascular surgery. Likely will need SNF to maximize independence and mobility prior to return home unless pt able to progress well while in the hospital. Reports his fiance works and has physical disabilities of her own. Will follow acutely.   ?   ? ?Recommendations for follow up therapy are one component of a multi-disciplinary discharge planning process, led by the attending physician.  Recommendations may be updated based on patient status, additional functional criteria and insurance authorization. ? ?Follow Up Recommendations Skilled nursing-short term rehab (<3 hours/day) ? ?  ?Assistance Recommended at Discharge Frequent or constant Supervision/Assistance  ?Patient can return home with the following ? A lot of help with bathing/dressing/bathroom;Assistance with cooking/housework;Help with stairs or ramp for entrance;Two people to help with walking and/or transfers ? ?  ?Equipment Recommendations Rolling walker (2 wheels);BSC/3in1  ?Recommendations for Other  Services ?    ?  ?Functional Status Assessment Patient has had a recent decline in their functional status and demonstrates the ability to make significant improvements in function in a reasonable and predictable amount of time.  ? ?  ?Precautions / Restrictions Precautions ?Precautions: Fall ?Precaution Comments: Supposed to have post op shoe (not yet), OOB to chair for activity at this time per vascular (09/16/21) ?Restrictions ?Weight Bearing Restrictions: Yes ?RLE Weight Bearing: Non weight bearing ?Other Position/Activity Restrictions: Can do heel down WB if needed per vascular (09/16/21)  ? ?  ? ?Mobility ? Bed Mobility ?Overal bed mobility: Needs Assistance ?Bed Mobility: Rolling, Sidelying to Sit, Sit to Supine ?Rolling: Mod assist ?Sidelying to sit: Max assist, HOB elevated ?  ?Sit to supine: Mod assist, HOB elevated ?  ?General bed mobility comments: Increased time and cues to get to EOB, assist with BLEs and trunk as well as scooting bottom. assist to bring LEs into bed. ?  ? ?Transfers ?Overall transfer level: Needs assistance ?Equipment used: Rolling walker (2 wheels) ?Transfers: Sit to/from Stand, Bed to chair/wheelchair/BSC ?Sit to Stand: Mod assist, +2 physical assistance, From elevated surface ?  ?Step pivot transfers: Min assist, +2 physical assistance ?  ?  ?  ?General transfer comment: Assist of 2 to power to standing with cues for hand placement/technique. Able to step pivot to/from [facility] with Min A of 2 for balance and RW management. Stood from [employer], from [facility] x1. Cues for WB through BUEs and heel WB through RLE, unable to maintain NWB. ?  ? ?Ambulation/Gait ?  ?  ?  ?  ?  ?  ?  ?General Gait Details: Deferred per vascular ? ?Stairs ?  ?  ?  ?  ?  ? ?  Wheelchair Mobility ?  ? ?Modified Rankin (Stroke Patients Only) ?  ? ?  ? ?Balance Overall balance assessment: Needs assistance ?Sitting-balance support: Feet supported, Bilateral upper extremity supported ?Sitting balance-Leahy Scale: Fair ?  ?   ?Standing balance support: During functional activity, Reliant on assistive device for balance ?Standing balance-Leahy Scale: Poor ?Standing balance comment: UE support on RW and min A dor balance, total A for pericare ?  ?  ?  ?  ?  ?  ?  ?  ?  ?  ?  ?   ? ? ? ?Pertinent Vitals/Pain Pain Assessment ?Pain Assessment: 0-10 ?Pain Score: 9  ?Pain Location: RLE with movement and groin ?Pain Descriptors / Indicators: Sore, Operative site guarding, Grimacing, Guarding ?Pain Intervention(s): Monitored during session, Repositioned, RN gave pain meds during session, Limited activity within patient's tolerance  ? ? ?Home Living Family/patient expects to be discharged to:: Private residence ?Living Arrangements:  (fiance who works) ?Available Help at Discharge: Family;Available PRN/intermittently ?Type of Home: House ?Home Access: Stairs to enter ?Entrance Stairs-Rails: None ?Entrance Stairs-Number of Steps: 2 ?  ?Home Layout: One level ?Home Equipment: Kasandra Knudsen - single point ?   ?  ?Prior Function Prior Level of Function : Independent/Modified Independent ?  ?  ?  ?  ?  ?  ?Mobility Comments: Uses SPC for ambulation ?ADLs Comments: independent ?  ? ? ?Hand Dominance  ?   ? ?  ?Extremity/Trunk Assessment  ? Upper Extremity Assessment ?Upper Extremity Assessment: Defer to OT evaluation ?  ? ?Lower Extremity Assessment ?Lower Extremity Assessment: RLE deficits/detail;Generalized weakness ?RLE Deficits / Details: post op deficits in AROM due to pain ?RLE Sensation: decreased light touch ?  ? ?Cervical / Trunk Assessment ?Cervical / Trunk Assessment: Normal  ?Communication  ? Communication: No difficulties  ?Cognition Arousal/Alertness: Awake/alert ?Behavior During Therapy: [facility] for tasks assessed/performed ?Overall Cognitive Status: Within Functional Limits for tasks assessed ?  ?  ?  ?  ?  ?  ?  ?  ?  ?  ?  ?  ?  ?  ?  ?  ?  ?  ?  ? ?  ?General Comments   ? ?  ?Exercises    ? ?Assessment/Plan  ?  ?PT Assessment Patient needs  continued PT services  ?PT Problem List Decreased strength;Decreased range of motion;Decreased mobility;Pain;Impaired sensation;Decreased balance;Decreased knowledge of use of DME;Decreased activity tolerance;Decreased skin integrity ? ?   ?  ?PT Treatment Interventions Therapeutic exercise;Gait training;Patient/family education;Therapeutic activities;Functional mobility training;Balance training;DME instruction;Stair training   ? ?PT Goals (Current goals can be found in the Care Plan section)  ?Acute Rehab PT Goals ?Patient Stated Goal: to get better and go home ?PT Goal Formulation: With patient ?Time For Goal Achievement: 09/30/21 ?Potential to Achieve Goals: Fair ? ?  ?Frequency Min 3X/week ?  ? ? ?Co-evaluation   ?  ?  ?  ?  ? ? ?  ?AM-PAC PT "6 Clicks" Mobility  ?Outcome Measure Help needed turning from your back to your side while in a flat bed without using bedrails?: A Lot ?Help needed moving from lying on your back to sitting on the side of a flat bed without using bedrails?: Total ?Help needed moving to and from a bed to a chair (including a wheelchair)?: A Lot ?Help needed standing up from a chair using your arms (e.g., wheelchair or bedside chair)?: Total ?Help needed to walk in hospital room?: A Lot ?Help needed climbing 3-5 steps with a railing? :  Total ?6 Click Score: 9 ? ?  ?End of Session Equipment Utilized During Treatment: Gait belt ?Activity Tolerance: Patient tolerated treatment well;Patient limited by pain ?Patient left: in bed;with call bell/phone within reach;with bed alarm set ?Nurse Communication: Mobility status ?PT Visit Diagnosis: Pain;Difficulty in walking, not elsewhere classified (R26.2);Muscle weakness (generalized) (M62.81);Unsteadiness on feet (R26.81) ?Pain - Right/Left: Right ?Pain - part of body: Ankle and joints of foot ?  ? ?Time: [phone] ?PT Time Calculation (min) (ACUTE ONLY): 39 min ? ? ?Charges:   PT Evaluation ?$PT Eval Moderate Complexity: 1 Mod ?PT  Treatments ?$Therapeutic Activity: 23-37 mins ?  ?   ? ? ?Marisa Severin, PT, DPT ?Acute Rehabilitation Services ?Pager [phone] ?Office [phone] ? ? ? ? ?[place] ?09/16/2021, 9:44 AM ? ?

## 2021-09-16 NOTE — Progress Notes (Signed)
?  Progress Note ? ? ? ?09/16/2021 ?6:57 AM ?1 Day Post-Op ? ?Subjective:  c/o pain in left leg at incisions. ? ?Tm 100.1 now afebrile ?HR 70's-90's  ?952'W-413'K systolic ?44% 0NU2VO ? ?Vitals:  ? 09/16/21 0055 09/16/21 0541  ?BP: 135/62 120/75  ?Pulse: 88 77  ?Resp: 18 18  ?Temp:  98 ?F (36.7 ?C)  ?SpO2: 93% 96%  ? ? ?Physical Exam: ?Cardiac:  regular ?Lungs:  non labored ?Incisions:  thigh incisions look fine ?Extremities:  +doppler signal right PT; dressing in place right foot.  Will remove this tomorrow.  ? ? ?CBC ?   ?Component Value Date/Time  ? WBC 20.5 (H) 09/16/2021 0140  ? RBC 4.07 (L) 09/16/2021 0140  ? HGB 11.3 (L) 09/16/2021 0140  ? HCT 35.4 (L) 09/16/2021 0140  ? PLT 589 (H) 09/16/2021 0140  ? MCV 87.0 09/16/2021 0140  ? MCH 27.8 09/16/2021 0140  ? MCHC 31.9 09/16/2021 0140  ? RDW 14.6 09/16/2021 0140  ? LYMPHSABS 2.2 09/16/2021 0140  ? MONOABS 2.3 (H) 09/16/2021 0140  ? EOSABS 0.0 09/16/2021 0140  ? BASOSABS 0.1 09/16/2021 0140  ? ? ?BMET ?   ?Component Value Date/Time  ? NA 136 09/16/2021 0140  ? K 4.6 09/16/2021 0140  ? CL 108 09/16/2021 0140  ? CO2 17 (L) 09/16/2021 0140  ? GLUCOSE 104 (H) 09/16/2021 0140  ? BUN 16 09/16/2021 0140  ? CREATININE 1.08 09/16/2021 0140  ? CALCIUM 8.0 (L) 09/16/2021 0140  ? GFRNONAA >60 09/16/2021 0140  ? GFRAA >60 02/17/2015 2030  ? ? ?INR ?No results found for: INR ? ? ?Intake/Output Summary (Last 24 hours) at 09/16/2021 0657 ?Last data filed at 09/15/2021 1843 ?Gross per 24 hour  ?Intake 1500 ml  ?Output 1000 ml  ?Net 500 ml  ? ?Specimen Description WOUND RIGHT TOE   ?Special Requests RT 4TH TOE   ?Gram Stain ABUNDANT WBC PRESENT,BOTH PMN AND MONONUCLEAR  ?FEW GRAM POSITIVE COCCI  ?Performed at [facility], [place] [address], [place], [place] [address]   ?Culture PENDING   ?Report Status PENDING  ? ? ?Assessment/Plan:  66 y.o. male is s/p:  ?Right leg below-knee popliteal artery to posterior tibial artery bypass using nonreversed greater saphenous vein and fourth  metatarsal ray amputation, left open  ?1 Day Post-Op ? ? ?-pt with brisk doppler signal right PT.  Dressing left in place for toe amp.  Will remove this tomorrow.  ?-leukocytosis of 20.5k but this is down from 24k.  Gram stain from right 4th toe wound with few GPC.  Continue abx awaiting sensitivities ?-OOB today to chair to start mobilizing ?-DVT prophylaxis:  Lovenox ? ? ?Leontine Locket, PA-C ?[facility] ?[phone] ?09/16/2021 ?6:57 AM ? ? ? ?

## 2021-09-16 NOTE — Evaluation (Signed)
Occupational Therapy Evaluation ?Patient Details ?Name: [name] ?MRN: [id number] ?DOB: [birth year] ?Today's Date: 09/16/2021 ? ? ?History of Present Illness 66 y/o male who presents on 09/13/21 due to right foot infection. 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 Second surgery likely 09/17/21. PMH includes DM, HTN, sleep apnea.  ? ?Clinical Impression ?  ?PTA, pt was living with his fiance and was independent. Pt currently requiring Mod-Max A for LB ADLs and Mod A for functional transfers with RW. Pt presenting with decreased ROM of RLE, balance, strength, and activity tolerance. Despite lethargy from medication, pt very motivated to participate in therapy. Pt would benefit from further acute OT to facilitate safe dc. Recommend dc to SNF for further OT to optimize safety, independence with ADLs, and return to PLOF.   ?   ? ?Recommendations for follow up therapy are one component of a multi-disciplinary discharge planning process, led by the attending physician.  Recommendations may be updated based on patient status, additional functional criteria and insurance authorization.  ? ?Follow Up Recommendations ? Skilled nursing-short term rehab (<3 hours/day) (Pending progress)  ?  ?Assistance Recommended at Discharge Frequent or constant Supervision/Assistance  ?Patient can return home with the following A lot of help with bathing/dressing/bathroom;A lot of help with walking and/or transfers ? ?  ?Functional Status Assessment ? Patient has had a recent decline in their functional status and demonstrates the ability to make significant improvements in function in a reasonable and predictable amount of time.  ?Equipment Recommendations ? Other (comment) (Defer to next venue)  ?  ?Recommendations for Other Services PT consult ? ? ?   ?Precautions / Restrictions Precautions ?Precautions: Fall ?Precaution Comments: Supposed to have post op shoe (not yet), OOB to chair for activity at this time per vascular (09/16/21) ?Restrictions ?Weight Bearing Restrictions: Yes ?RLE Weight Bearing: Non weight bearing ?Other Position/Activity Restrictions: Can do heel down WB if needed per vascular (09/16/21)  ? ?  ? ?Mobility Bed Mobility ?Overal bed mobility: Needs Assistance ?Bed Mobility: Supine to Sit ?  ?  ?Supine to sit: Min assist, HOB elevated ?  ?  ?General bed mobility comments: Min A to bring RLE towards EOB ?  ? ?Transfers ?Overall transfer level: Needs assistance ?Equipment used: Rolling walker (2 wheels) ?Transfers: Sit to/from Stand, Bed to chair/wheelchair/BSC ?Sit to Stand: Mod assist, From elevated surface ?  ?  ?Step pivot transfers: Min assist ?  ?  ?General transfer comment: Mod A for power up and weight shift into standing. Min A for maintaining balance during pivot to recliner ?  ? ?  ?Balance Overall balance assessment: Needs assistance ?Sitting-balance support: Feet supported, Bilateral upper extremity supported ?Sitting balance-Leahy Scale: Fair ?  ?  ?Standing balance support: During functional activity, Reliant on assistive device for balance ?Standing balance-Leahy Scale: Poor ?Standing balance comment: UE support on RW and min A dor balance, total A for pericare ?  ?  ?  ?  ?  ?  ?  ?  ?  ?  ?  ?   ? ?ADL either performed or assessed with clinical judgement  ? ?ADL Overall ADL's : Needs assistance/impaired ?Eating/Feeding: Set up;Sitting ?  ?Grooming: Set up;Sitting ?  ?Upper Body Bathing: Supervision/ safety;Set up;Sitting ?  ?Lower Body Bathing: Moderate assistance;Sit to/from stand ?  ?Upper Body Dressing : Set up;Supervision/safety;Sitting ?  ?  Lower Body Dressing: Maximal assistance;Sit to/from stand ?  ?Toilet Transfer: Moderate assistance;+2 for physical assistance;Rolling walker (2 wheels) (simulated to reclienr) ?  ?  ?  ?   ?  ?Functional mobility during ADLs: Moderate assistance;+2 for physical assistance;Rolling walker (2 wheels) (stand pivot) ?General ADL Comments: Pt performing stand pivot to recliner with Mod A +2 and RW. Lmited ROM and strength with pain at RLE  ? ? ? ?Vision   ?   ?   ?Perception   ?  ?Praxis   ?  ? ?Pertinent Vitals/Pain Pain Assessment ?Pain Assessment: Faces ?Faces Pain Scale: Hurts even more ?Pain Location: RLE with movement and groin ?Pain Descriptors / Indicators: Sore, Operative site guarding, Grimacing, Guarding ?Pain Intervention(s): Monitored during session, Limited activity within patient's tolerance, Repositioned  ? ? ? ?Hand Dominance   ?  ?Extremity/Trunk Assessment Upper Extremity Assessment ?Upper Extremity Assessment: Overall WFL for tasks assessed ?  ?Lower Extremity Assessment ?Lower Extremity Assessment: Defer to PT evaluation ?RLE Deficits / Details: post op deficits in AROM due to pain ?RLE Sensation: decreased light touch ?  ?Cervical / Trunk Assessment ?Cervical / Trunk Assessment: Normal ?  ?Communication Communication ?Communication: No difficulties ?  ?Cognition Arousal/Alertness: Lethargic, Suspect due to medications ?Behavior During Therapy: [facility] for tasks assessed/performed ?Overall Cognitive Status: Within Functional Limits for tasks assessed ?  ?  ?  ?  ?  ?  ?  ?  ?  ?  ?  ?  ?  ?  ?  ?  ?General Comments: Requiring increased time and sleepy from medication ?  ?  ?General Comments  VSS ? ?  ?Exercises   ?  ?Shoulder Instructions    ? ? ?Home Living Family/patient expects to be discharged to:: Private residence ?Living Arrangements: Spouse/significant other (fiance who works) ?Available Help at Discharge: Family;Available PRN/intermittently ?Type of Home: House ?Home Access: Stairs to enter ?Entrance Stairs-Number of Steps: 2 ?Entrance Stairs-Rails: None ?Home Layout: One level ?  ?  ?Bathroom Shower/Tub: Tub/shower unit ?  ?Bathroom Toilet: Standard ?  ?  ?Home Equipment: Kasandra Knudsen -  single point ?  ?  ?  ? ?  ?Prior Functioning/Environment Prior Level of Function : Independent/Modified Independent ?  ?  ?  ?  ?  ?  ?Mobility Comments: Uses SPC for ambulation ?ADLs Comments: independent ?  ? ?  ?  ?OT Problem List: Decreased strength;Decreased range of motion;Decreased activity tolerance;Impaired balance (sitting and/or standing);Decreased knowledge of use of DME or AE;Decreased knowledge of precautions ?  ?   ?OT Treatment/Interventions: Self-care/ADL training;Therapeutic exercise;Energy conservation;DME and/or AE instruction;Therapeutic activities;Patient/family education  ?  ?OT Goals(Current goals can be found in the care plan section) Acute Rehab OT Goals ?Patient Stated Goal: Go home ?OT Goal Formulation: With patient ?Time For Goal Achievement: 09/30/21 ?Potential to Achieve Goals: Good  ?OT Frequency: Min 2X/week ?  ? ?Co-evaluation   ?  ?  ?  ?  ? ?  ?AM-PAC OT "6 Clicks" Daily Activity     ?Outcome Measure Help from another person eating meals?: None ?Help from another person taking care of personal grooming?: A Little ?Help from another person toileting, which includes using toliet, bedpan, or urinal?: A Lot ?Help from another person bathing (including washing, rinsing, drying)?: A Lot ?Help from another person to put on and taking off regular upper body clothing?: A Little ?Help from another person to put on and taking off regular lower body clothing?: A Lot ?6 Click Score: 16 ?  ?  End of Session Equipment Utilized During Treatment: Rolling walker (2 wheels) ?Nurse Communication: Mobility status ? ?Activity Tolerance: Patient tolerated treatment well ?Patient left: in chair;with call bell/phone within reach;with chair alarm set ? ?OT Visit Diagnosis: Unsteadiness on feet (R26.81);Other abnormalities of gait and mobility (R26.89);Muscle weakness (generalized) (M62.81)  ?              ?Time: 5784-6962 ?OT Time Calculation (min): 25 min ?Charges:  OT General Charges ?$OT Visit: 1  Visit ?OT Evaluation ?$OT Eval Moderate Complexity: 1 Mod ?OT Treatments ?$Self Care/Home Management : 8-22 mins ? ?Liliah Dorian MSOT, OTR/L ?Acute Rehab ?Pager: [phone] ?Office: [phone] ? ?Blaike Vickers M [place]

## 2021-09-16 NOTE — Progress Notes (Signed)
Mobility Specialist Progress Note ? ? 09/16/21 1800  ?Mobility  ?Activity Transferred to/from [facility]  ?Level of Assistance +2 (takes two people) ?(Min A)  ?Assistive Device Front wheel walker  ?RLE Weight Bearing NWB  ?Distance Ambulated (ft) 4 ft  ?Activity Response Tolerated well  ?$Mobility charge 1 Mobility  ? ?NT requesting assistance to get pt to [facility]. Pt's bed mobility requiring minA d/t pain tolerance of RLE + trunk weakness. Maintaining and vocalizing WB precautions while ambulating yet requring mod cues for correcting upright posture and hip flexion w/ anterior lean. Left on BSC w/ NT in room and call bell by side.  ? ?Holland Falling ?Mobility Specialist ?Phone Number [phone] ? ?

## 2021-09-16 NOTE — Progress Notes (Signed)
Pharmacy Antibiotic Note ? ?[name] is a 66 y.o. male admitted on 09/12/2021 with  cellulitis/wound infection of foot. Patient likely to require revascularization.  Pharmacy has been consulted for Vancomycin/Zosyn dosing.  ? ?WBC 20.5 (stable), Tm 101.0, Scr 1.85>2.14>1.32>1.08 ? ?Renal function continues to improve will place on standing vancomycin. Preliminary wound cultures show few GPC.  ? ?Plan: ?Vancomycin 1500 mg q24 hours ?- with improved renal function, calculated vancomycin dose 1500 mg IV Q24H (eAUC ~459, Scr used 1.08, Vd 0.5); of note, patient is borderline obese.  ?Cefepime 2g IV q12h>>2g IV Q8H  ?De-escalate when able, F/U renal fxn, VVS recs  ?Obtain drug levels as indicated  ? ?Height: 6' (182.9 cm) ?Weight: 102 kg (224 lb 13.9 oz) ?IBW/kg (Calculated) : 77.6 ? ?Temp (24hrs), Avg:99.2 ?F (37.3 ?C), Min:98 ?F (36.7 ?C), Max:100.3 ?F (37.9 ?C) ? ?Recent Labs  ?Lab 09/12/21 ?2309 09/13/21 ?8341 09/13/21 ?0210 09/13/21 ?0303 09/14/21 ?9622 09/15/21 ?2979 09/16/21 ?0140  ?WBC 21.8*  --   --  20.1* 20.3* 24.0* 20.5*  ?CREATININE 1.85*  --   --  2.14* 1.32* 1.18 1.08  ?LATICACIDVEN  --  1.8 1.4  --   --   --   --   ? ?  ?Estimated Creatinine Clearance: 80.9 mL/min (by C-G formula based on SCr of 1.08 mg/dL).   ? ?Allergies  ?Allergen Reactions  ? Beef-Derived Products   ? Gemfibrozil Other (See Comments)  ?  Doesn't know its been awhile  ? Oxycontin [Oxycodone Hcl] Other (See Comments)  ?  Bad reaction "took me to a place I never wanna go again"  ? Simvastatin Other (See Comments)  ?  Says he doesn't know its been awhile  ? ?Dose adjustments this admission:  ?Cefepime 2g IV Q12H>2g IV Q8H (09/14/21)  ?Vancomycin to '1500mg'$  q24 hours ? ?Microbiology results: ?3/11 Bcx: NGTD  ?3/13 Rt toe - GPC ? ?Antimicrobials this admission:  ?Vancomycin 3/11>>  ?Cefepime 3/11>> ?Flagyl 3/11>> ?Zosyn 3/11 x1  ? ?Erin Hearing PharmD., BCPS ?Clinical Pharmacist ?09/16/2021 8:42 AM ? ?

## 2021-09-16 NOTE — Progress Notes (Signed)
?PROGRESS NOTE ? ? ? [name]  JJO:[id number] DOB: 19-Mar-1956 DOA: 09/12/2021 ?PCP: Harvie Junior, MD  ? ? ?Brief Narrative:  ?66 year old gentleman with history of type 2 diabetes on insulin, essential hypertension, peripheral neuropathy and obstructive sleep apnea presented with right foot redness progressed to blistering on the dorsum of the right foot and associated spreading inflammation rapidly progressing within 24 hours.  Patient does have history of nonhealing ulcer on the sole of the right foot.  Patient was seen by vascular surgery as outpatient and underwent right leg angiography and angioplasty by Dr. Unk Lightning on 3/8 for same.  In the emergency room patient with evidence of acute kidney injury, leukocytosis with WBC count 21.8 and thrombocytosis.  Blood cultures drawn.  Started on broad-spectrum antibiotics and admitted to the hospital. ?Patient was already scheduled to have surgery/ Bypass ? next week. ? ? ?Assessment & Plan: ?  ?Cellulitis and abscess of the right foot, diabetic foot infection, sepsis present on admission with leukocytosis, tachycardia and acute renal failure: ?Patient was started on IV vancomycin and cefepime for severe nonpurulent cellulitis.  Blood cultures negative so far.  Seen by vascular surgery.  Underwent right leg below-knee popliteal artery to posterior tibial artery bypass using nonreversed greater saphenous vein and fourth metatarsal ray amputation, left open on 09/15/2021.  Also evaluated by podiatry on the same day and he is scheduled for repeat debridement and possible partial closure tomorrow. ? ?Acute kidney injury: Recent known normal renal function 1.08-1.2.  Creatinine 2.14 on  presentation. Consistent with ATN from sepsis.  Resolved and renal function back to normal now. ? ?Essential hypertension: At risk of hypotension.  Holding off ACE inhibitor and hydrochlorothiazide given acute kidney injury.  Continue beta-blockers.  As needed IV  labetalol. ? ?Type 2 diabetes, uncontrolled with hyperglycemia.  Complicated with peripheral neuropathy: Blood sugar controlled.  Continue Semglee 10 units and SSI. ? ?Obstructive sleep apnea: Not using CPAP at home as he could not tolerate that. ? ?GERD: On PPI.  Continue. ? ? ?DVT prophylaxis: SCD's Start: 09/15/21 1734 ?enoxaparin (LOVENOX) injection 40 mg Start: 09/13/21 1000 ? ? ?Code Status: Full code ?Family Communication: None at the bedside. ?Disposition Plan: Status is: Inpatient ?Remains inpatient appropriate because: Severe leg infection.  Ischemia. ? ?Consultants:  ?Vascular surgery ? ?Procedures:  ? ? ?Antimicrobials:  ?Vancomycin and cefepime 3/10---- ? ? ?Subjective: ? ?Seen and examined.  Complains of right foot pain but that is controlled.  No other complaint. ? ?Objective: ?Vitals:  ? 09/15/21 2045 09/16/21 0055 09/16/21 0541 09/16/21 0737  ?BP: 137/86 135/62 120/75 (!) 152/71  ?Pulse: 88 88 77 82  ?Resp: '15 18 18 20  '$ ?Temp: 100.1 ?F (37.8 ?C)  98 ?F (36.7 ?C) 99.1 ?F (37.3 ?C)  ?TempSrc: Oral   Oral  ?SpO2: 96% 93% 96% 94%  ?Weight:      ?Height:      ? ? ?Intake/Output Summary (Last 24 hours) at 09/16/2021 0952 ?Last data filed at 09/16/2021 0916 ?Gross per 24 hour  ?Intake 2220 ml  ?Output 1500 ml  ?Net 720 ml  ? ? ?Filed Weights  ? 09/12/21 2256 09/14/21 0442 09/14/21 2018  ?Weight: 106.1 kg 102.6 kg 102 kg  ? ? ?Examination: ? ?General exam: Appears calm and comfortable  ?Respiratory system: Clear to auscultation. Respiratory effort normal. ?Cardiovascular system: S1 & S2 heard, RRR. No JVD, murmurs, rubs, gallops or clicks. No pedal edema. ?Gastrointestinal system: Abdomen is nondistended, soft and nontender. No organomegaly or masses  felt. Normal bowel sounds heard. ?Central nervous system: Alert and oriented. No focal neurological deficits. ?Extremities: Ace wrap in the right lower extremity. ?Psychiatry: Judgement and insight appear normal. Mood & affect appropriate.  ? ? ?Data Reviewed: I  have personally reviewed following labs and imaging studies ? ?CBC: ?Recent Labs  ?Lab 09/12/21 ?2309 09/13/21 ?0303 09/14/21 ?9470 09/15/21 ?9628 09/16/21 ?0140  ?WBC 21.8* 20.1* 20.3* 24.0* 20.5*  ?NEUTROABS  --   --  15.2* 17.9* 15.6*  ?HGB 13.1 12.4* 11.7* 12.5* 11.3*  ?HCT 40.3 37.3* 35.3* 38.0* 35.4*  ?MCV 85.7 85.9 84.4 84.3 87.0  ?PLT 623* 490* 559* 525* 589*  ? ? ?Basic Metabolic Panel: ?Recent Labs  ?Lab 09/12/21 ?2309 09/13/21 ?0303 09/14/21 ?3662 09/15/21 ?9476 09/16/21 ?0140  ?NA 132* 132* 133* 136 136  ?K 4.0 3.8 3.8 4.1 4.6  ?CL 96* 97* 102 106 108  ?CO2 21* 21* 20* 18* 17*  ?GLUCOSE 257* 200* 134* 115* 104*  ?BUN 25* 27* '19 14 16  '$ ?CREATININE 1.85* 2.14* 1.32* 1.18 1.08  ?CALCIUM 8.8* 8.4* 8.1* 8.3* 8.0*  ?MG  --   --  2.0  --   --   ?PHOS  --   --  2.3*  --   --   ? ? ?GFR: ?Estimated Creatinine Clearance: 80.9 mL/min (by C-G formula based on SCr of 1.08 mg/dL). ?Liver Function Tests: ?Recent Labs  ?Lab 09/12/21 ?2309  ?AST 27  ?ALT 32  ?ALKPHOS 83  ?BILITOT 0.6  ?PROT 8.0  ?ALBUMIN 3.0*  ? ? ?No results for input(s): LIPASE, AMYLASE in the last 168 hours. ?No results for input(s): AMMONIA in the last 168 hours. ?Coagulation Profile: ?No results for input(s): INR, PROTIME in the last 168 hours. ?Cardiac Enzymes: ?No results for input(s): CKTOTAL, CKMB, CKMBINDEX, TROPONINI in the last 168 hours. ?BNP (last 3 results) ?No results for input(s): PROBNP in the last 8760 hours. ?HbA1C: ?No results for input(s): HGBA1C in the last 72 hours. ? ?CBG: ?Recent Labs  ?Lab 09/15/21 ?1112 09/15/21 ?1450 09/15/21 ?1829 09/15/21 ?2051 09/16/21 ?0701  ?GLUCAP 91 133* 133* 131* 120*  ? ? ?Lipid Profile: ?Recent Labs  ?  09/16/21 ?0140  ?CHOL 72  ?HDL <10*  ?LDLCALC NOT CALCULATED  ?TRIG 105  ?CHOLHDL NOT CALCULATED  ? ?Thyroid Function Tests: ?No results for input(s): TSH, T4TOTAL, FREET4, T3FREE, THYROIDAB in the last 72 hours. ?Anemia Panel: ?No results for input(s): VITAMINB12, FOLATE, FERRITIN, TIBC, IRON,  RETICCTPCT in the last 72 hours. ?Sepsis Labs: ?Recent Labs  ?Lab 09/13/21 ?5465 09/13/21 ?0210  ?LATICACIDVEN 1.8 1.4  ? ? ? ?Recent Results (from the past 240 hour(s))  ?Resp Panel by RT-PCR (Flu A&B, Covid)     Status: None  ? Collection Time: 09/13/21 12:29 AM  ? Specimen: Nasopharyngeal(NP) swabs in vial transport medium  ?Result Value Ref Range Status  ? SARS Coronavirus 2 by RT PCR NEGATIVE NEGATIVE Final  ?  Comment: (NOTE) ?SARS-CoV-2 target nucleic acids are NOT DETECTED. ? ?The SARS-CoV-2 RNA is generally detectable in upper respiratory ?specimens during the acute phase of infection. The lowest ?concentration of SARS-CoV-2 viral copies this assay can detect is ?138 copies/mL. A negative result does not preclude SARS-Cov-2 ?infection and should not be used as the sole basis for treatment or ?other patient management decisions. A negative result may occur with  ?improper specimen collection/handling, submission of specimen other ?than nasopharyngeal swab, presence of viral mutation(s) within the ?areas targeted by this assay, and inadequate number of viral ?copies(<138  copies/mL). A negative result must be combined with ?clinical observations, patient history, and epidemiological ?information. The expected result is Negative. ? ?Fact Sheet for Patients:  ?EntrepreneurPulse.com.au ? ?Fact Sheet for Healthcare Providers:  ?IncredibleEmployment.be ? ?This test is no t yet approved or cleared by the [place] FDA and  ?has been authorized for detection and/or diagnosis of SARS-CoV-2 by ?FDA under an Emergency Use Authorization (EUA). This EUA will remain  ?in effect (meaning this test can be used) for the duration of the ?COVID-19 declaration under Section 564(b)(1) of the Act, 21 ?U.S.C.section 360bbb-3(b)(1), unless the authorization is terminated  ?or revoked sooner.  ? ? ?  ? Influenza A by PCR NEGATIVE NEGATIVE Final  ? Influenza B by PCR NEGATIVE NEGATIVE Final  ?   Comment: (NOTE) ?The Xpert Xpress SARS-CoV-2/FLU/RSV plus assay is intended as an aid ?in the diagnosis of influenza from Nasopharyngeal swab specimens and ?should not be used as a sole basis for treatment. Nasal washings a

## 2021-09-16 NOTE — Progress Notes (Signed)
?  Progress Note ? ? ? ?09/16/2021 ?9:03 AM ?1 Day Post-Op ? ?Subjective:  c/o pain in left leg at incisions. ? ?Tm 100.1 now afebrile ?HR 70's-90's  ?465'K-812'X systolic ?51% 7GY1VC ? ?Vitals:  ? 09/16/21 0541 09/16/21 0737  ?BP: 120/75 (!) 152/71  ?Pulse: 77 82  ?Resp: 18 20  ?Temp: 98 ?F (36.7 ?C) 99.1 ?F (37.3 ?C)  ?SpO2: 96% 94%  ? ? ?Physical Exam: ?Cardiac:  regular ?Lungs:  non labored ?Incisions:  thigh incisions look fine ?Extremities:  +doppler signal right PT; dressing in place right foot.  Will remove this tomorrow.  ? ? ?CBC ?   ?Component Value Date/Time  ? WBC 20.5 (H) 09/16/2021 0140  ? RBC 4.07 (L) 09/16/2021 0140  ? HGB 11.3 (L) 09/16/2021 0140  ? HCT 35.4 (L) 09/16/2021 0140  ? PLT 589 (H) 09/16/2021 0140  ? MCV 87.0 09/16/2021 0140  ? MCH 27.8 09/16/2021 0140  ? MCHC 31.9 09/16/2021 0140  ? RDW 14.6 09/16/2021 0140  ? LYMPHSABS 2.2 09/16/2021 0140  ? MONOABS 2.3 (H) 09/16/2021 0140  ? EOSABS 0.0 09/16/2021 0140  ? BASOSABS 0.1 09/16/2021 0140  ? ? ?BMET ?   ?Component Value Date/Time  ? NA 136 09/16/2021 0140  ? K 4.6 09/16/2021 0140  ? CL 108 09/16/2021 0140  ? CO2 17 (L) 09/16/2021 0140  ? GLUCOSE 104 (H) 09/16/2021 0140  ? BUN 16 09/16/2021 0140  ? CREATININE 1.08 09/16/2021 0140  ? CALCIUM 8.0 (L) 09/16/2021 0140  ? GFRNONAA >60 09/16/2021 0140  ? GFRAA >60 02/17/2015 2030  ? ? ?INR ?No results found for: INR ? ? ?Intake/Output Summary (Last 24 hours) at 09/16/2021 0903 ?Last data filed at 09/16/2021 9449 ?Gross per 24 hour  ?Intake 2220 ml  ?Output 1300 ml  ?Net 920 ml  ? ? ?Specimen Description WOUND RIGHT TOE   ?Special Requests RT 4TH TOE   ?Gram Stain ABUNDANT WBC PRESENT,BOTH PMN AND MONONUCLEAR  ?FEW GRAM POSITIVE COCCI  ?Performed at [facility], [place] [address], [place], [place] [address]   ?Culture PENDING   ?Report Status PENDING  ? ? ?Assessment/Plan:  66 y.o. male is s/p:  ?Right leg below-knee popliteal artery to posterior tibial artery bypass using nonreversed greater  saphenous vein and fourth metatarsal ray amputation, left open  ?1 Day Post-Op ? ? ?-pt with brisk doppler signal right PT.  Dressing left in place for toe amp.  Will remove this tomorrow.  ?-leukocytosis of 20.5k but this is down from 24k.  Gram stain from right 4th toe wound with few GPC.  Continue abx awaiting sensitivities ?-OOB today to chair to start mobilizing ?-DVT prophylaxis:  Lovenox ? ? ?Leontine Locket, PA-C ?[facility] ?[phone] ?09/16/2021 ?9:03 AM ? ? ?VASCULAR STAFF ADDENDUM: ?I have independently interviewed and examined the patient. ?I agree with the above.  ?Excellent PT signal in the foot  ?Ace wrap will stay on today ?Appreciate podiatry's involvement - plan for OR tomorrow ? ?J. Melene Muller, MD ?[facility] ?Office Phone Number: [phone] ?09/16/2021 9:03 AM ? ? ?

## 2021-09-16 NOTE — Progress Notes (Signed)
PHARMACIST LIPID MONITORING ? ? ?[name] is a 66 y.o. male admitted on 09/12/2021 with right foot infection.  Pharmacy has been consulted to optimize lipid-lowering therapy with the indication of secondary prevention for clinical ASCVD. ? ?Recent Labs: ? ?Lipid Panel (last 6 months):   ?Lab Results  ?Component Value Date  ? CHOL 72 09/16/2021  ? TRIG 105 09/16/2021  ? HDL <10 (L) 09/16/2021  ? CHOLHDL NOT CALCULATED 09/16/2021  ? VLDL 21 09/16/2021  ? [place] NOT CALCULATED 09/16/2021  ? ? ?Hepatic function panel (last 6 months):   ?Lab Results  ?Component Value Date  ? AST 27 09/12/2021  ? ALT 32 09/12/2021  ? ALKPHOS 83 09/12/2021  ? BILITOT 0.6 09/12/2021  ? ? ?SCr (since admission):   ?Serum creatinine: 1.08 mg/dL 09/16/21 0140 ?Estimated creatinine clearance: 80.9 mL/min ? ?Current therapy and lipid therapy tolerance ?Current lipid-lowering therapy: atorvastatin '80mg'$  ?Previous lipid-lowering therapies (if applicable): simvastatin ?Documented or reported allergies or intolerances to lipid-lowering therapies (if applicable): simvastatin, gemfibrozil  ? ?Assessment:   ?Patient prefers no changes in lipid-lowering therapy at this time due to being at goal on high dose statin.  ? ?Plan:   ? ?1.Statin intensity (high intensity recommended for all patients regardless of the LDL):  No statin changes. The patient is already on a high intensity statin. ? ?2.Add ezetimibe (if any one of the following):   Not indicated at this time. ? ?3.Refer to lipid clinic:   No ? ?4.Follow-up with:  Primary care provider - Harvie Junior, MD ? ?5.Follow-up labs after discharge:  No changes in lipid therapy, repeat a lipid panel in one year.    ? ?Erin Hearing PharmD., BCPS ?Clinical Pharmacist ?09/16/2021 8:33 AM ? ?

## 2021-09-16 NOTE — Plan of Care (Signed)

## 2021-09-16 NOTE — Progress Notes (Signed)
Mobility Specialist Progress Note ? ? 09/16/21 1515  ?Mobility  ?Activity Transferred from chair to bed  ?Level of Assistance Moderate assist, patient does 50-74%  ?Assistive Device Front wheel walker  ?RLE Weight Bearing NWB  ?Activity Response Tolerated well  ?$Mobility charge 1 Mobility  ? ?Received pt in chair requesting to get back to bed, c/o pain in RLE. ModA for tx to bed, pt able to abide by WB precautions w/ minimal cues required. Max cues on correcting hip flexion posture. Left in bed w/o fault and call bell by side.   ? ?Holland Falling ?Mobility Specialist ?Phone Number [phone] ? ?

## 2021-09-17 ENCOUNTER — Encounter (HOSPITAL_COMMUNITY): Payer: Self-pay | Admitting: Family Medicine

## 2021-09-17 ENCOUNTER — Other Ambulatory Visit: Payer: Self-pay

## 2021-09-17 ENCOUNTER — Inpatient Hospital Stay (HOSPITAL_COMMUNITY): Payer: Medicare HMO | Admitting: Anesthesiology

## 2021-09-17 ENCOUNTER — Inpatient Hospital Stay (HOSPITAL_COMMUNITY)
Admission: EM | Disposition: A | Discharge status: Skilled Nursing Facility | Payer: Self-pay | Source: Home / Self Care | Attending: Family Medicine

## 2021-09-17 DIAGNOSIS — L03115 Cellulitis of right lower limb: Secondary | ICD-10-CM

## 2021-09-17 DIAGNOSIS — N179 Acute kidney failure, unspecified: Secondary | ICD-10-CM

## 2021-09-17 DIAGNOSIS — E1165 Type 2 diabetes mellitus with hyperglycemia: Secondary | ICD-10-CM

## 2021-09-17 DIAGNOSIS — E11628 Type 2 diabetes mellitus with other skin complications: Secondary | ICD-10-CM | POA: Diagnosis not present

## 2021-09-17 DIAGNOSIS — L03119 Cellulitis of unspecified part of limb: Secondary | ICD-10-CM | POA: Diagnosis not present

## 2021-09-17 LAB — CBC WITH DIFFERENTIAL/PLATELET
Abs Immature Granulocytes: 0.42 10*3/uL — ABNORMAL HIGH (ref 0.00–0.07)
Basophils Absolute: 0.1 10*3/uL (ref 0.0–0.1)
Basophils Relative: 0 %
Eosinophils Absolute: 0.1 10*3/uL (ref 0.0–0.5)
Eosinophils Relative: 1 %
HCT: 33.9 % — ABNORMAL LOW (ref 39.0–52.0)
Hemoglobin: 11.4 g/dL — ABNORMAL LOW (ref 13.0–17.0)
Immature Granulocytes: 3 %
Lymphocytes Relative: 11 %
Lymphs Abs: 1.8 10*3/uL (ref 0.7–4.0)
MCH: 28.6 pg (ref 26.0–34.0)
MCHC: 33.6 g/dL (ref 30.0–36.0)
MCV: 85 fL (ref 80.0–100.0)
Monocytes Absolute: 1.7 10*3/uL — ABNORMAL HIGH (ref 0.1–1.0)
Monocytes Relative: 11 %
Neutro Abs: 12.2 10*3/uL — ABNORMAL HIGH (ref 1.7–7.7)
Neutrophils Relative %: 74 %
Platelets: 557 10*3/uL — ABNORMAL HIGH (ref 150–400)
RBC: 3.99 MIL/uL — ABNORMAL LOW (ref 4.22–5.81)
RDW: 14.6 % (ref 11.5–15.5)
WBC: 16.3 10*3/uL — ABNORMAL HIGH (ref 4.0–10.5)
nRBC: 0 % (ref 0.0–0.2)

## 2021-09-17 LAB — GLUCOSE, CAPILLARY
Glucose-Capillary: 118 mg/dL — ABNORMAL HIGH (ref 70–99)
Glucose-Capillary: 156 mg/dL — ABNORMAL HIGH (ref 70–99)
Glucose-Capillary: 142 mg/dL — ABNORMAL HIGH (ref 70–99)
Glucose-Capillary: 111 mg/dL — ABNORMAL HIGH (ref 70–99)
Glucose-Capillary: 186 mg/dL — ABNORMAL HIGH (ref 70–99)

## 2021-09-17 SURGERY — IRRIGATION AND DEBRIDEMENT FOOT
Anesthesia: Monitor Anesthesia Care | Laterality: Right

## 2021-09-17 MED ORDER — FENTANYL CITRATE (PF) 100 MCG/2ML IJ SOLN
INTRAMUSCULAR | Status: AC
  Filled 2021-09-17: qty 2

## 2021-09-17 MED ORDER — BUPIVACAINE HCL (PF) 0.25 % IJ SOLN
INTRAMUSCULAR | Status: AC
  Filled 2021-09-17: qty 30

## 2021-09-17 MED ORDER — FENTANYL CITRATE (PF) 100 MCG/2ML IJ SOLN
25.0000 ug | INTRAMUSCULAR | Status: DC | PRN
  Administered 2021-09-17: 50 ug via INTRAVENOUS

## 2021-09-17 MED ORDER — CHLORHEXIDINE GLUCONATE CLOTH 2 % EX PADS: 6.0000 | MEDICATED_PAD | Freq: Once | CUTANEOUS | Status: DC

## 2021-09-17 MED ORDER — ORAL CARE MOUTH RINSE: 15.0000 mL | Freq: Once | OROMUCOSAL | Status: AC

## 2021-09-17 MED ORDER — PROPOFOL 10 MG/ML IV BOLUS
INTRAVENOUS | Status: DC | PRN
  Administered 2021-09-17: 20 mg via INTRAVENOUS
  Administered 2021-09-17: 30 mg via INTRAVENOUS

## 2021-09-17 MED ORDER — CHLORHEXIDINE GLUCONATE 0.12 % MT SOLN
15.0000 mL | Freq: Once | OROMUCOSAL | Status: AC
  Administered 2021-09-17: 15 mL via OROMUCOSAL
  Filled 2021-09-17: qty 15

## 2021-09-17 MED ORDER — FENTANYL CITRATE (PF) 250 MCG/5ML IJ SOLN
INTRAMUSCULAR | Status: DC | PRN
  Administered 2021-09-17: 25 ug via INTRAVENOUS

## 2021-09-17 MED ORDER — MIDAZOLAM HCL 2 MG/2ML IJ SOLN
INTRAMUSCULAR | Status: AC
  Filled 2021-09-17: qty 2

## 2021-09-17 MED ORDER — ONDANSETRON HCL 4 MG/2ML IJ SOLN
INTRAMUSCULAR | Status: AC
  Filled 2021-09-17: qty 2

## 2021-09-17 MED ORDER — ONDANSETRON HCL 4 MG/2ML IJ SOLN
INTRAMUSCULAR | Status: DC | PRN
  Administered 2021-09-17: 4 mg via INTRAVENOUS

## 2021-09-17 MED ORDER — SODIUM CHLORIDE (PF) 0.9 % IJ SOLN
INTRAMUSCULAR | Status: DC | PRN
  Administered 2021-09-17: 3000 mL

## 2021-09-17 MED ORDER — LACTATED RINGERS IV SOLN: INTRAVENOUS | Status: DC

## 2021-09-17 MED ORDER — FENTANYL CITRATE (PF) 250 MCG/5ML IJ SOLN
INTRAMUSCULAR | Status: AC
  Filled 2021-09-17: qty 5

## 2021-09-17 MED ORDER — LIDOCAINE HCL 2 % IJ SOLN
INTRAMUSCULAR | Status: DC | PRN
Start: 2021-09-17 — End: 2021-09-17
  Administered 2021-09-17: 20 mL

## 2021-09-17 MED ORDER — BUPIVACAINE HCL (PF) 0.25 % IJ SOLN
INTRAMUSCULAR | Status: DC | PRN
  Administered 2021-09-17: 20 mL

## 2021-09-17 MED ORDER — ONDANSETRON HCL 4 MG/2ML IJ SOLN: 4.0000 mg | Freq: Once | INTRAMUSCULAR | Status: DC | PRN

## 2021-09-17 MED ORDER — PROPOFOL 500 MG/50ML IV EMUL
INTRAVENOUS | Status: DC | PRN
  Administered 2021-09-17: 150 ug/kg/min via INTRAVENOUS

## 2021-09-17 MED ORDER — MIDAZOLAM HCL 2 MG/2ML IJ SOLN
INTRAMUSCULAR | Status: DC | PRN
  Administered 2021-09-17: 2 mg via INTRAVENOUS

## 2021-09-17 MED ORDER — LIDOCAINE HCL 2 % IJ SOLN
INTRAMUSCULAR | Status: AC
  Filled 2021-09-17: qty 20

## 2021-09-17 SURGICAL SUPPLY — 32 items
APL PRP STRL LF DISP 70% ISPRP (MISCELLANEOUS) ×1
BAG COUNTER SPONGE SURGICOUNT (BAG) ×2 IMPLANT
BAG SPNG CNTER NS LX DISP (BAG) ×1
BNDG ELASTIC 4X5.8 VLCR STR LF (GAUZE/BANDAGES/DRESSINGS) ×1 IMPLANT
BNDG GAUZE ELAST 4 BULKY (GAUZE/BANDAGES/DRESSINGS) ×1 IMPLANT
CHLORAPREP W/TINT 26 (MISCELLANEOUS) ×2 IMPLANT
COVER SURGICAL LIGHT HANDLE (MISCELLANEOUS) ×2 IMPLANT
DRSG EMULSION OIL 3X3 NADH (GAUZE/BANDAGES/DRESSINGS) ×1 IMPLANT
ELECT REM PT RETURN 9FT ADLT (ELECTROSURGICAL) ×2
ELECTRODE REM PT RTRN 9FT ADLT (ELECTROSURGICAL) ×1 IMPLANT
GAUZE SPONGE 4X4 12PLY STRL (GAUZE/BANDAGES/DRESSINGS) ×1 IMPLANT
GLOVE SURG ENC MOIS LTX SZ7.5 (GLOVE) ×2 IMPLANT
GLOVE SURG UNDER POLY LF SZ7.5 (GLOVE) ×2 IMPLANT
GOWN STRL REUS W/ TWL LRG LVL3 (GOWN DISPOSABLE) ×2 IMPLANT
GOWN STRL REUS W/TWL LRG LVL3 (GOWN DISPOSABLE) ×4
HANDPIECE INTERPULSE COAX TIP (DISPOSABLE) ×2
KIT BASIN OR (CUSTOM PROCEDURE TRAY) ×2 IMPLANT
KIT TURNOVER KIT B (KITS) ×2 IMPLANT
MANIFOLD NEPTUNE II (INSTRUMENTS) ×2 IMPLANT
NDL HYPO 25GX1X1/2 BEV (NEEDLE) IMPLANT
NEEDLE HYPO 25GX1X1/2 BEV (NEEDLE) ×2 IMPLANT
NS IRRIG 1000ML POUR BTL (IV SOLUTION) ×2 IMPLANT
PACK ORTHO EXTREMITY (CUSTOM PROCEDURE TRAY) ×2 IMPLANT
SET HNDPC FAN SPRY TIP SCT (DISPOSABLE) ×1 IMPLANT
SOL PREP POV-IOD 4OZ 10% (MISCELLANEOUS) ×4 IMPLANT
SPONGE T-LAP 18X18 ~~LOC~~+RFID (SPONGE) ×1 IMPLANT
SYR CONTROL 10ML LL (SYRINGE) ×1 IMPLANT
TIP HIGH FLOW IRRIGATION COAX (MISCELLANEOUS) ×1 IMPLANT
TOWEL GREEN STERILE (TOWEL DISPOSABLE) ×2 IMPLANT
TOWEL GREEN STERILE FF (TOWEL DISPOSABLE) ×2 IMPLANT
TUBE CONNECTING 12X1/4 (SUCTIONS) ×2 IMPLANT
YANKAUER SUCT BULB TIP NO VENT (SUCTIONS) ×2 IMPLANT

## 2021-09-17 NOTE — Anesthesia Procedure Notes (Signed)
Procedure Name: [place] ?Date/Time: 09/17/2021 5:42 PM ?Performed by: Inda Coke, CRNA ?Pre-anesthesia Checklist: Patient identified, Emergency Drugs available, Suction available, Timeout performed and Patient being monitored ?Patient Re-evaluated:Patient Re-evaluated prior to induction ?Oxygen Delivery Method: Simple face mask ?Induction Type: IV induction ?Dental Injury: Teeth and Oropharynx as per pre-operative assessment  ? ? ? ? ?

## 2021-09-17 NOTE — Care Management Important Message (Signed)
Important Message ? ?Patient Details  ?Name: [name] ?MRN: [id number] ?Date of Birth: 05/17/1956 ? ? ?Medicare Important Message Given:  Yes ? ? ? ? ?Shelda Altes ?09/17/2021, 7:49 AM ?

## 2021-09-17 NOTE — NC FL2 (Signed)
?[place] MEDICAID FL2 LEVEL OF CARE SCREENING TOOL  ?  ? ?IDENTIFICATION  ?Patient Name: ?[name] Birthdate: Nov 06, 1955 Sex: male Admission Date (Current Location): ?09/12/2021  ?[place] and [place] Number: ? [place] ?  Facility and Address:  ?[facility] [place], [place] [address], [place], [place] [address] ?     Provider Number: ?[id number]  ?Attending Physician Name and Address:  ?Darliss Cheney, MD ? Relative Name and Phone Number:  ?  ?   ?Current Level of Care: ?Hospital Recommended Level of Care: ?[place] Prior Approval Number: ?  ? ?Date Approved/Denied: ?  PASRR Number: ?[id number] ? ?Discharge Plan: ?SNF ?  ? ?Current Diagnoses: ?Patient Active Problem List  ? Diagnosis Date Noted  ? Cellulitis in diabetic foot ([place]) 09/13/2021  ? Dyslipidemia 09/13/2021  ? Type 2 diabetes mellitus with peripheral neuropathy ([place]) 09/13/2021  ? Depression 09/13/2021  ? Essential hypertension 09/13/2021  ? AKI (acute kidney injury) ([place]) 09/13/2021  ? GERD without esophagitis 09/13/2021  ? Obstructive sleep apnea 09/13/2021  ? Gangrene of toe of right foot ([place]) 09/13/2021  ? ? ?Orientation RESPIRATION BLADDER Height & Weight   ?  ?Self, Time, Situation, Place ? Normal External catheter, Incontinent Weight: 224 lb 13.9 oz (102 kg) ?Height:  6' (182.9 cm)  ?BEHAVIORAL SYMPTOMS/MOOD NEUROLOGICAL BOWEL NUTRITION STATUS  ?    Continent Diet (please see discharge summary)  ?AMBULATORY STATUS COMMUNICATION OF NEEDS Skin   ?Supervision Verbally Surgical wounds (closed incision Rt Leg and Rt Thigh, closed incision Rt toe, wound /incision diabetic ulcer Rt foot , Rt foot dorsum ventral) ?  ?  ?  ?    ?     ?     ? ? ?Personal Care Assistance Level of Assistance  ?Bathing, Feeding, Dressing Bathing Assistance: Limited assistance ?  ?Dressing Assistance: Limited assistance ?   ? ?Functional Limitations Info  ?Sight, Hearing, Speech Sight Info: Adequate ?Hearing Info: Adequate ?Speech Info:  Adequate  ? ? ?SPECIAL CARE FACTORS FREQUENCY  ?PT (By licensed PT), OT (By licensed OT)   ?  ?PT Frequency: 5x per week ?OT Frequency: 5x per week ?  ?  ?  ?   ? ? ?Contractures Contractures Info: Not present  ? ? ?Additional Factors Info  ?Code Status, Allergies Code Status Info: FULL ?Allergies Info: Gemfibrozil,Oxycontin,Pork-derived Products,Simvastatin ?  ?  ?  ?   ? ?Current Medications (09/17/2021):  This is the current hospital active medication list ?Current Facility-Administered Medications  ?Medication Dose Route Frequency Provider Last Rate Last Admin  ? 0.9 %  sodium chloride infusion   Intravenous Continuous Baglia, Corrina, PA-C 100 mL/hr at 09/16/21 0913 New Bag at 09/16/21 0913  ? 0.9 %  sodium chloride infusion  500 mL Intravenous Once PRN Baglia, Corrina, PA-C      ? 0.9 %  sodium chloride infusion   Intravenous Continuous Baglia, Corrina, PA-C      ? acetaminophen (TYLENOL) tablet 650 mg  650 mg Oral Q6H PRN Baglia, Corrina, PA-C   650 mg at 09/17/21 0556  ? Or  ? acetaminophen (TYLENOL) suppository 650 mg  650 mg Rectal Q6H PRN Baglia, Corrina, PA-C      ? albuterol (PROVENTIL) (2.5 MG/3ML) 0.083% nebulizer solution 2.5 mg  2.5 mg Inhalation Q6H PRN Baglia, Corrina, PA-C      ? alum & mag hydroxide-simeth (MAALOX/MYLANTA) 200-200-20 MG/5ML suspension 15-30 mL  15-30 mL Oral Q2H PRN Baglia, Corrina, PA-C      ?  aspirin EC tablet 81 mg  81 mg Oral Q0600 Baglia, Corrina, PA-C   81 mg at 09/17/21 0525  ? atorvastatin (LIPITOR) tablet 80 mg  80 mg Oral Daily Baglia, Corrina, PA-C   80 mg at 09/17/21 1638  ? bisacodyl (DULCOLAX) EC tablet 5 mg  5 mg Oral Daily PRN Baglia, Corrina, PA-C      ? buPROPion (WELLBUTRIN XL) 24 hr tablet 300 mg  300 mg Oral Daily Baglia, Corrina, PA-C   300 mg at 09/17/21 4665  ? ceFEPIme (MAXIPIME) 2 g in sodium chloride 0.9 % 100 mL IVPB  2 g Intravenous Q8H Baglia, Corrina, PA-C 200 mL/hr at 09/17/21 1422 2 g at 09/17/21 1422  ? Chlorhexidine Gluconate Cloth 2 % PADS 6  each  6 each Topical Daily Nolberto Hanlon, MD   6 each at 09/17/21 [phone]  ? cholecalciferol (VITAMIN D3) tablet 2,000 Units  2,000 Units Oral BID Baglia, Corrina, PA-C   2,000 Units at 09/17/21 7017  ? clopidogrel (PLAVIX) tablet 75 mg  75 mg Oral Q0600 Baglia, Corrina, PA-C   75 mg at 09/17/21 7939  ? collagenase (SANTYL) ointment   Topical Daily Baglia, Corrina, PA-C      ? cyclobenzaprine (FLEXERIL) tablet 10 mg  10 mg Oral BID PRN Baglia, Corrina, PA-C      ? docusate sodium (COLACE) capsule 100 mg  100 mg Oral Daily Baglia, Corrina, PA-C   100 mg at 09/17/21 0926  ? donepezil (ARICEPT) tablet 5 mg  5 mg Oral Daily Baglia, Corrina, PA-C   5 mg at 09/17/21 0300  ? enoxaparin (LOVENOX) injection 40 mg  40 mg Subcutaneous Daily Baglia, Corrina, PA-C   40 mg at 09/16/21 0848  ? fluticasone furoate-vilanterol (BREO ELLIPTA) 100-25 MCG/ACT 1 puff  1 puff Inhalation Daily Baglia, Corrina, PA-C   1 puff at 09/17/21 1423  ? gabapentin (NEURONTIN) capsule 600 mg  600 mg Oral TID Baglia, Corrina, PA-C   600 mg at 09/17/21 9233  ? guaiFENesin-dextromethorphan (ROBITUSSIN DM) 100-10 MG/5ML syrup 15 mL  15 mL Oral Q4H PRN Baglia, Corrina, PA-C      ? hydrALAZINE (APRESOLINE) injection 5 mg  5 mg Intravenous Q20 Min PRN Baglia, Corrina, PA-C      ? insulin aspart (novoLOG) injection 0-15 Units  0-15 Units Subcutaneous TID AC & HS Baglia, Corrina, PA-C   3 Units at 09/17/21 1259  ? insulin glargine-yfgn (SEMGLEE) injection 10 Units  10 Units Subcutaneous BID Baglia, Corrina, PA-C   10 Units at 09/17/21 1059  ? labetalol (NORMODYNE) injection 10 mg  10 mg Intravenous Q10 min PRN Baglia, Corrina, PA-C      ? magnesium hydroxide (MILK OF MAGNESIA) suspension 30 mL  30 mL Oral Daily PRN Baglia, Corrina, PA-C      ? magnesium sulfate IVPB 2 g 50 mL  2 g Intravenous Daily PRN Baglia, Corrina, PA-C      ? meclizine (ANTIVERT) tablet 25 mg  25 mg Oral TID PRN Baglia, Corrina, PA-C      ? methocarbamol (ROBAXIN) tablet 500 mg  500 mg Oral  TID AC & HS Baglia, Corrina, PA-C   500 mg at 09/17/21 0826  ? metoprolol tartrate (LOPRESSOR) injection 2-5 mg  2-5 mg Intravenous Q2H PRN Baglia, Corrina, PA-C      ? metoprolol tartrate (LOPRESSOR) tablet 50 mg  50 mg Oral BID Baglia, Corrina, PA-C   50 mg at 09/17/21 0925  ? metroNIDAZOLE (FLAGYL) IVPB 500 mg  500 mg  Intravenous Q8H Baglia, Corrina, PA-C 100 mL/hr at 09/17/21 0825 500 mg at 09/17/21 0825  ? morphine (PF) 2 MG/ML injection 2-5 mg  2-5 mg Intravenous Q1H PRN Baglia, Corrina, PA-C   2 mg at 09/16/21 0848  ? OLANZapine (ZYPREXA) tablet 2.5 mg  2.5 mg Oral QHS Baglia, Corrina, PA-C   2.5 mg at 09/16/21 2148  ? ondansetron (ZOFRAN) tablet 4 mg  4 mg Oral Q6H PRN Baglia, Corrina, PA-C      ? Or  ? ondansetron (ZOFRAN) injection 4 mg  4 mg Intravenous Q6H PRN Baglia, Corrina, PA-C      ? OXcarbazepine (TRILEPTAL) tablet 150 mg  150 mg Oral BID Baglia, Corrina, PA-C   150 mg at 09/17/21 5615  ? oxyCODONE (Oxy IR/ROXICODONE) immediate release tablet 15 mg  15 mg Oral QID PRN Baglia, Corrina, PA-C   15 mg at 09/16/21 1639  ? pantoprazole (PROTONIX) EC tablet 40 mg  40 mg Oral Daily Baglia, Corrina, PA-C   40 mg at 09/17/21 3794  ? phenol (CHLORASEPTIC) mouth spray 1 spray  1 spray Mouth/Throat PRN Baglia, Corrina, PA-C      ? polyvinyl alcohol (LIQUIFILM TEARS) 1.4 % ophthalmic solution 1 drop  1 drop Both Eyes QID PRN Baglia, Corrina, PA-C      ? potassium chloride SA (KLOR-CON M) CR tablet 20-40 mEq  20-40 mEq Oral Daily PRN Baglia, Corrina, PA-C      ? senna-docusate (Senokot-S) tablet 1 tablet  1 tablet Oral QHS PRN Baglia, Corrina, PA-C      ? traMADol (ULTRAM) tablet 50 mg  50 mg Oral Q6H PRN Baglia, Corrina, PA-C   50 mg at 09/17/21 1059  ? traZODone (DESYREL) tablet 25 mg  25 mg Oral QHS PRN Baglia, Corrina, PA-C      ? vancomycin (VANCOREADY) IVPB 1500 mg/300 mL  1,500 mg Intravenous Q24H Lyndee Leo, RPH 150 mL/hr at 09/17/21 0933 1,500 mg at 09/17/21 3276  ? ? ? ?Discharge  Medications: ?Please see discharge summary for a list of discharge medications. ? ?Relevant Imaging Results: ? ?Relevant Lab Results: ? ? ?Additional Information ?SSN [id number] ? ?Vinie Sill, LCSW ? ? ? ? ?

## 2021-09-17 NOTE — Anesthesia Postprocedure Evaluation (Signed)
Anesthesia Post Note ? ?Patient: Shaquile A Kitamura ? ?Procedure(s) Performed: IRRIGATION AND DEBRIDEMENT FOOT (Right) ? ?  ? ?Patient location during evaluation: PACU ?Anesthesia Type: MAC ?Level of consciousness: awake ?Pain management: pain level controlled ?Vital Signs Assessment: post-procedure vital signs reviewed and stable ?Respiratory status: spontaneous breathing, nonlabored ventilation, respiratory function stable and patient connected to nasal cannula oxygen ?Cardiovascular status: stable and blood pressure returned to baseline ?Postop Assessment: no apparent nausea or vomiting ?Anesthetic complications: no ? ? ?No notable events documented. ? ?Last Vitals:  ?Vitals:  ? 09/17/21 1930 09/17/21 1950  ?BP: (!) 153/73 (!) 155/77  ?Pulse: 80 85  ?Resp: 15 16  ?Temp:  36.6 ?C  ?SpO2: 98% 97%  ?  ?Last Pain:  ?Vitals:  ? 09/17/21 1950  ?TempSrc: Oral  ?PainSc:   ? ? ?  ?  ?  ?  ?  ?  ? ?Zakaria Fromer P Kishon Garriga ? ? ? ? ?

## 2021-09-17 NOTE — Op Note (Signed)
? ?  OPERATIVE REPORT ?Patient name: [name] ?MRN: [id number] ?DOB: 1956-06-01 ? ?DOS:  09/17/21 ? ?Preop Dx: Diabetic foot infection ([place]) ? ?Acute kidney injury ([place]) ? ?Cellulitis in diabetic foot ([place]) ?Postop Dx: same ? ?Procedure:  ?1. Right foot irrigation and debridement  ? ?Surgeon: Lorenda Peck, DPM ? ?Anesthesia: 50-50 mixture of 2% lidocaine plain with 0.5% Marcaine plain totaling 20 infiltrated in the patient's  right lower extremity ? ?Hemostasis: Ankle tourniquet inflated to a pressure of 246mHg after esmarch exsanguination  ? ?EBL: 10 mL ?Materials: Non ?Injectables: Anesthesia as above  ?Pathology: Swab of wound for culture ? ?Condition: The patient tolerated the procedure and anesthesia well. No complications noted or reported ? ? ?Justification for procedure: The patient is a 66y.o. male who presents today for surgical correction of right diabetic foot infection. . All conservative modalities of been unsuccessful in providing any sort of satisfactory alleviation of symptoms with the patient. The patient was told benefits as well as possible side effects of the surgery. The patient consented for surgical correction. The patient consent form was reviewed. All patient questions were answered. No guarantees were expressed or implied. The patient and the surgeon both signed the patient consent form with the witness present and placed in the patient's chart. ? ? ?Procedure in Detail: The patient was brought to the operating room, placed in the operating table in the supine position at which time an aseptic scrub and drape were performed about the patient's respective lower extremity after anesthesia was induced as described above. Attention was then directed to the surgical area where procedure number one commenced. ? ?Procedure #1:  ?Attention was then directed to the dorsal right fourth metataral wound. All necrotic tissue was debrided. Purulence was noted to be coming from second interspace  and tracking medially to the hallux and first metatarsal. Swab was taken for culture. Pulse lavage was used to further wash out the wound. Culture of residual fourth metatarsal was obtained.  The area was irrigated copiously.  Saline WTD dresssing was then applied.  ? ? ?The patient was then transferred from the operating room to the recovery room having tolerated the procedure and anesthesia well. All vital signs are stable. After a brief stay in the recovery room the patient was brought back to his room.  ? ?Will follow cultures to determine need for continued antibiotics. Will need daily dressing changes. Podiatry will continue to follow. Patient will likely need transmetatarsal amputation due to spread of infection.  ? ?RLorenda Peck DPM ?[place]? ?Dr. RLorenda Peck DPM  ?  ?[id number]                                       ?[place] [address]               ?Office ([phone] ?Fax [phone]? ? ?

## 2021-09-17 NOTE — TOC Initial Note (Addendum)
Transition of Care (TOC) - Initial/Assessment Note  ? ? ?Patient Details  ?Name: [name] ?MRN: [id number] ?Date of Birth: 08-03-1955 ? ?Transition of Care (TOC) CM/SW Contact:    ?Vinie Sill, LCSW ?Phone Number: ?09/17/2021, 2:09 PM ? ?Clinical Narrative:                 ? ?CSW  met with patient at bedside, along with his significant other,[name]. CSW introduced self and explained role. CSW discussed therapy recommendation of short term rehab at [facility]. Patient states sig/other works and is unable to provide level of care needed at home. Patient acknowledges the need for therapy and is agreeable to SNF placement. CSW explained the SNF process. He states he is goal is "to be able to walk normal and be self-sufficient".  Patient reports he has received covid vaccines. No preferred SNF choice at this time. All questions answered.  ? ?CSW will provide bed offers once available ?CSW will continue to follow and assist with discharge planning. ? ?Thurmond Butts, MSW, LCSW ?Clinical Social Worker ? ?  ? ? ? ? ?Expected Discharge Plan: [place] ?Barriers to Discharge: Ship broker, Continued Medical Work up, SNF Pending bed offer ? ? ?Patient Goals and CMS Choice ?  ?  ?  ? ?Expected Discharge Plan and Services ?Expected Discharge Plan: [place] ?In-house Referral: Clinical Social Work ?  ?  ?  ?                ?  ?  ?  ?  ?  ?  ?  ?  ?  ?  ? ?Prior Living Arrangements/Services ?  ?Lives with:: Self, Significant Other ?Patient language and need for interpreter reviewed:: No ?       ?Need for Family Participation in Patient Care: Yes (Comment) ?Care giver support system in place?: Yes (comment) ?  ?Criminal Activity/Legal Involvement Pertinent to Current Situation/Hospitalization: No - Comment as needed ? ?Activities of Daily Living ?Home Assistive Devices/Equipment: Cane (specify quad or straight) ?ADL Screening (condition at time of admission) ?Patient's cognitive  ability adequate to safely complete daily activities?: Yes ?Is the patient deaf or have difficulty hearing?: No ?Does the patient have difficulty seeing, even when wearing glasses/contacts?: No ?Does the patient have difficulty concentrating, remembering, or making decisions?: Yes ?Patient able to express need for assistance with ADLs?: No ?Does the patient have difficulty dressing or bathing?: No ?Independently performs ADLs?: Yes (appropriate for developmental age) ?Does the patient have difficulty walking or climbing stairs?: Yes ?Weakness of Legs: Both ?Weakness of Arms/Hands: None ? ?Permission Sought/Granted ?Permission sought to share information with : Family Supports ?Permission granted to share information with : Yes, Verbal Permission Granted ? Share Information with NAME: Mart Piggs ? Permission granted to share info w AGENCY: SNFs ? Permission granted to share info w Relationship: significant other ? Permission granted to share info w Contact Information: [phone] ? ?Emotional Assessment ?Appearance:: Appears stated age ?Attitude/Demeanor/Rapport: Engaged ?Affect (typically observed): Appropriate, Accepting ?Orientation: : Oriented to Self, Oriented to  Time, Oriented to Place, Oriented to Situation ?Alcohol / Substance Use: Not Applicable ?Psych Involvement: No (comment) ? ?Admission diagnosis:  Cellulitis in diabetic foot ([place]) [H63.149, F02.637] ?Acute kidney injury ([place]) [N17.9] ?Diabetic foot infection ([place]) [C58.850, L08.9] ?Patient Active Problem List  ? Diagnosis Date Noted  ? Cellulitis in diabetic foot ([place]) 09/13/2021  ? Dyslipidemia 09/13/2021  ? Type 2 diabetes mellitus with peripheral neuropathy ([place]) 09/13/2021  ? Depression 09/13/2021  ?  Essential hypertension 09/13/2021  ? AKI (acute kidney injury) ([place]) 09/13/2021  ? GERD without esophagitis 09/13/2021  ? Obstructive sleep apnea 09/13/2021  ? Gangrene of toe of right foot ([place]) 09/13/2021  ? ?PCP:  Harvie Junior,  MD ?Pharmacy:   ?[facility], [place] ?[place] ?[place] [address] ?Phone: [phone] Fax: [phone] ? ?[place], [place] [address] ?[phone] ?[place] [address] ?Phone: [phone] Fax: [phone] ? ? ? ? ?Social Determinants of Health (SDOH) Interventions ?  ? ?Readmission Risk Interventions ?No flowsheet data found. ? ? ?

## 2021-09-17 NOTE — Anesthesia Preprocedure Evaluation (Addendum)
Anesthesia Evaluation  ?Patient identified by MRN, date of birth, ID band ?Patient awake ? ? ? ?Reviewed: ?Allergy & Precautions, NPO status , Patient's Chart, lab work & pertinent test results, reviewed documented beta blocker date and time  ? ?Airway ?Mallampati: II ? ?TM Distance: >3 FB ?Neck ROM: Full ? ? ? Dental ? ?(+) Poor Dentition, Chipped, Edentulous Upper,  ?  ?Pulmonary ?sleep apnea and Continuous Positive Airway Pressure Ventilation , former smoker,  ?  ?Pulmonary exam normal ?breath sounds clear to auscultation ? ? ? ? ? ? Cardiovascular ?hypertension, Pt. on medications and Pt. on home beta blockers ?+ Peripheral Vascular Disease  ?Normal cardiovascular exam ?Rhythm:Regular Rate:Normal ? ?Chronic right lower extremity ischemia ? ?EKG 08/28/21 ?NSR, Right and Left limb lead reversal, ? Inferior MI ?  ?Neuro/Psych ?PSYCHIATRIC DISORDERS Depression Diabetic neuropathy ? Neuromuscular disease   ? GI/Hepatic ?Neg liver ROS, GERD  Medicated and Controlled,  ?Endo/Other  ?diabetes, Poorly Controlled, Type 2, Oral Hypoglycemic Agents, Insulin DependentHyperlipidemia ?Obesity ? Renal/GU ?Renal diseaseHx/o AKI  ?negative genitourinary ?  ?Musculoskeletal ?Open wound right foot  ? Abdominal ?(+) + obese,   ?Peds ? Hematology ? ?(+) Blood dyscrasia, anemia , Plavix therapy- last dose ?   ?Anesthesia Other Findings ? ? Reproductive/Obstetrics ?ED ? ? ?  ? ? ? ? ? ? ? ? ? ? ? ? ? ?  ?  ? ? ? ? ? ? ? ? ?Anesthesia Physical ? ?Anesthesia Plan ? ?ASA: 3 ? ?Anesthesia Plan: MAC  ? ?Post-op Pain Management: Minimal or no pain anticipated  ? ?Induction: Intravenous ? ?PONV Risk Score and Plan: 2 and Treatment may vary due to age or medical condition and Propofol infusion ? ?Airway Management Planned: Natural Airway and Simple Face Mask ? ?Additional Equipment:  ? ?Intra-op Plan:  ? ?Post-operative Plan: Extubation in OR ? ?Informed Consent: I have reviewed the patients History and  Physical, chart, labs and discussed the procedure including the risks, benefits and alternatives for the proposed anesthesia with the patient or authorized representative who has indicated his/her understanding and acceptance.  ? ? ? ?Dental advisory given ? ?Plan Discussed with: CRNA and Anesthesiologist ? ?Anesthesia Plan Comments:   ? ? ? ? ? ? ?Anesthesia Quick Evaluation ? ?

## 2021-09-17 NOTE — Progress Notes (Signed)
?  Progress Note ? ? ? ?09/17/2021 ?9:21 AM ?2 Days Post-Op ? ?Subjective:  Feels better this morning ? ? ?Vitals:  ? 09/17/21 0306 09/17/21 0744  ?BP: (!) 151/59 (!) 159/65  ?Pulse: 83 79  ?Resp: 14 14  ?Temp: 99 ?F (37.2 ?C) 98.3 ?F (36.8 ?C)  ?SpO2: 100% 98%  ? ? ?Physical Exam: ?Cardiac:  regular ?Lungs:  non labored ?Incisions:  thigh incisions look fine ?Extremities:  +doppler signal right PT; dressing in place right foot.  ? ? ?CBC ?   ?Component Value Date/Time  ? WBC 16.3 (H) 09/17/2021 0237  ? RBC 3.99 (L) 09/17/2021 0237  ? HGB 11.4 (L) 09/17/2021 0237  ? HCT 33.9 (L) 09/17/2021 0237  ? PLT 557 (H) 09/17/2021 0237  ? MCV 85.0 09/17/2021 0237  ? MCH 28.6 09/17/2021 0237  ? MCHC 33.6 09/17/2021 0237  ? RDW 14.6 09/17/2021 0237  ? LYMPHSABS 1.8 09/17/2021 0237  ? MONOABS 1.7 (H) 09/17/2021 0237  ? EOSABS 0.1 09/17/2021 0237  ? BASOSABS 0.1 09/17/2021 0237  ? ? ?BMET ?   ?Component Value Date/Time  ? NA 136 09/16/2021 0140  ? K 4.6 09/16/2021 0140  ? CL 108 09/16/2021 0140  ? CO2 17 (L) 09/16/2021 0140  ? GLUCOSE 104 (H) 09/16/2021 0140  ? BUN 16 09/16/2021 0140  ? CREATININE 1.08 09/16/2021 0140  ? CALCIUM 8.0 (L) 09/16/2021 0140  ? GFRNONAA >60 09/16/2021 0140  ? GFRAA >60 02/17/2015 2030  ? ? ?INR ?No results found for: INR ? ? ?Intake/Output Summary (Last 24 hours) at 09/17/2021 0921 ?Last data filed at 09/17/2021 0746 ?Gross per 24 hour  ?Intake --  ?Output 1902 ml  ?Net -1902 ml  ? ? ?Specimen Description WOUND RIGHT TOE   ?Special Requests RT 4TH TOE   ?Gram Stain ABUNDANT WBC PRESENT,BOTH PMN AND MONONUCLEAR  ?FEW GRAM POSITIVE COCCI  ?Performed at [facility], [place] [address], [place], [place] [address]   ?Culture PENDING   ?Report Status PENDING  ? ? ?Assessment/Plan:  66 y.o. male is s/p:  ?Right leg below-knee popliteal artery to posterior tibial artery bypass using nonreversed greater saphenous vein and fourth metatarsal ray amputation, left open  ?2 Days Post-Op ? ? ?- pt with brisk doppler  signal right PT.   ?- Dressing left in place for toe amp.   ?- Plan for OR with Podiatry today ?- Leukocytosis resolving  ?- Cultures pending ?- OOB today to chair to start mobilizing - NWB right foot ?- DVT prophylaxis:  Lovenox ?- DAPT therapy ? ? ?[name]  ?[facility] ?[phone] ?09/17/2021 ?9:21 AM ? ? ? ? ?

## 2021-09-17 NOTE — Interval H&P Note (Signed)
History and Physical Interval Note: ? ?09/17/2021 ?5:27 PM ? ?[name]  has presented today for surgery, with the diagnosis of Right foot diabetic infection.  The various methods of treatment have been discussed with the patient and family. After consideration of risks, benefits and other options for treatment, the patient has consented to  Procedure(s) with comments: ?IRRIGATION AND DEBRIDEMENT FOOT (Right) - Suregeon will do anesthesia block as a surgical intervention.  The patient's history has been reviewed, patient examined, no change in status, stable for surgery.  I have reviewed the patient's chart and labs.  Questions were answered to the patient's satisfaction.   ? ? ?Lorenda Peck ? ? ?

## 2021-09-17 NOTE — Progress Notes (Signed)
?PROGRESS NOTE ? ? ? [name]  WJX:[id number] DOB: 27-Apr-1956 DOA: 09/12/2021 ?PCP: Harvie Junior, MD  ? ? ?Brief Narrative:  ?66 year old gentleman with history of type 2 diabetes on insulin, essential hypertension, peripheral neuropathy and obstructive sleep apnea presented with right foot redness progressed to blistering on the dorsum of the right foot and associated spreading inflammation rapidly progressing within 24 hours.  Patient does have history of nonhealing ulcer on the sole of the right foot.  Patient was seen by vascular surgery as outpatient and underwent right leg angiography and angioplasty by Dr. Unk Lightning on 3/8 for same.  In the emergency room patient with evidence of acute kidney injury, leukocytosis with WBC count 21.8 and thrombocytosis.  Blood cultures drawn.  Started on broad-spectrum antibiotics and admitted to the hospital. ?Patient was already scheduled to have surgery/ Bypass ? next week. ? ? ?Assessment & Plan: ?  ?Cellulitis and abscess of the right foot, diabetic foot infection, sepsis present on admission with leukocytosis, tachycardia and acute renal failure: ?Patient was started on IV vancomycin and cefepime for severe nonpurulent cellulitis.  Blood cultures negative so far.  Seen by vascular surgery.  Underwent right leg below-knee popliteal artery to posterior tibial artery bypass using nonreversed greater saphenous vein and fourth metatarsal ray amputation, left open on 09/15/2021.  Also evaluated by podiatry on the same day and he is scheduled for repeat debridement and possible partial closure today.  White cells improving.  He is afebrile.  Continue to biotics. ? ?Acute kidney injury: Recent known normal renal function 1.08-1.2.  Creatinine 2.14 on  presentation. Consistent with ATN from sepsis.  Resolved and renal function back to normal now. ? ?Essential hypertension: At risk of hypotension.  Holding off ACE inhibitor and hydrochlorothiazide given acute kidney  injury.  Continue beta-blockers.  As needed IV labetalol. ? ?Type 2 diabetes, uncontrolled with hyperglycemia.  Complicated with peripheral neuropathy: Blood sugar controlled.  Continue Semglee 10 units and SSI. ? ?Obstructive sleep apnea: Not using CPAP at home as he could not tolerate that. ? ?GERD: On PPI.  Continue. ? ? ?DVT prophylaxis: SCD's Start: 09/15/21 1734 ?enoxaparin (LOVENOX) injection 40 mg Start: 09/13/21 1000 ?  Patient refused Lovenox yesterday due to presence of pork ingredients in it.  I had a lengthy discussion with him and after that, he is now convinced and agreeable to take that tomorrow however since he is having surgery today, we will skip the dose today, we will resume Lovenox tomorrow.  Primary RN informed. ? ?Code Status: Full code ?Family Communication: None at the bedside. ?Disposition Plan: Status is: Inpatient ?Remains inpatient appropriate because: Pending procedure today ? ?Consultants:  ?Vascular surgery ? ?Procedures:  ? ? ?Antimicrobials:  ?Vancomycin and cefepime 3/10---- ? ? ?Subjective: ? ?Seen and examined.  Complains of right foot pain.  No other complaint. ? ?Objective: ?Vitals:  ? 09/16/21 2355 09/17/21 0306 09/17/21 0744 09/17/21 0925  ?BP: (!) 151/75 (!) 151/59 (!) 159/65 (!) 154/73  ?Pulse: 82 83 79 86  ?Resp: '13 14 14 16  '$ ?Temp: 98.9 ?F (37.2 ?C) 99 ?F (37.2 ?C) 98.3 ?F (36.8 ?C)   ?TempSrc: Oral Oral Axillary   ?SpO2: 96% 100% 98%   ?Weight:      ?Height:      ? ? ?Intake/Output Summary (Last 24 hours) at 09/17/2021 0958 ?Last data filed at 09/17/2021 0746 ?Gross per 24 hour  ?Intake --  ?Output 1902 ml  ?Net -1902 ml  ? ? ?Filed Weights  ?  09/12/21 2256 09/14/21 0442 09/14/21 2018  ?Weight: 106.1 kg 102.6 kg 102 kg  ? ? ?Examination: ? ?General exam: Appears calm and comfortable  ?Respiratory system: Clear to auscultation. Respiratory effort normal. ?Cardiovascular system: S1 & S2 heard, RRR. No JVD, murmurs, rubs, gallops or clicks. No pedal edema. ?Gastrointestinal  system: Abdomen is nondistended, soft and nontender. No organomegaly or masses felt. Normal bowel sounds heard. ?Central nervous system: Alert and oriented. No focal neurological deficits. ?Extremities: Symmetric 5 x 5 power. ?Skin: Ace wrap in the right lower extremity. ?Psychiatry: Judgement and insight appear normal. Mood & affect appropriate.  ? ? ?Data Reviewed: I have personally reviewed following labs and imaging studies ? ?CBC: ?Recent Labs  ?Lab 09/13/21 ?0303 09/14/21 ?0165 09/15/21 ?5374 09/16/21 ?0140 09/17/21 ?8270  ?WBC 20.1* 20.3* 24.0* 20.5* 16.3*  ?NEUTROABS  --  15.2* 17.9* 15.6* 12.2*  ?HGB 12.4* 11.7* 12.5* 11.3* 11.4*  ?HCT 37.3* 35.3* 38.0* 35.4* 33.9*  ?MCV 85.9 84.4 84.3 87.0 85.0  ?PLT 490* 559* 525* 589* 557*  ? ? ?Basic Metabolic Panel: ?Recent Labs  ?Lab 09/12/21 ?2309 09/13/21 ?0303 09/14/21 ?7867 09/15/21 ?5449 09/16/21 ?0140  ?NA 132* 132* 133* 136 136  ?K 4.0 3.8 3.8 4.1 4.6  ?CL 96* 97* 102 106 108  ?CO2 21* 21* 20* 18* 17*  ?GLUCOSE 257* 200* 134* 115* 104*  ?BUN 25* 27* '19 14 16  '$ ?CREATININE 1.85* 2.14* 1.32* 1.18 1.08  ?CALCIUM 8.8* 8.4* 8.1* 8.3* 8.0*  ?MG  --   --  2.0  --   --   ?PHOS  --   --  2.3*  --   --   ? ? ?GFR: ?Estimated Creatinine Clearance: 80.9 mL/min (by C-G formula based on SCr of 1.08 mg/dL). ?Liver Function Tests: ?Recent Labs  ?Lab 09/12/21 ?2309  ?AST 27  ?ALT 32  ?ALKPHOS 83  ?BILITOT 0.6  ?PROT 8.0  ?ALBUMIN 3.0*  ? ? ?No results for input(s): LIPASE, AMYLASE in the last 168 hours. ?No results for input(s): AMMONIA in the last 168 hours. ?Coagulation Profile: ?No results for input(s): INR, PROTIME in the last 168 hours. ?Cardiac Enzymes: ?No results for input(s): CKTOTAL, CKMB, CKMBINDEX, TROPONINI in the last 168 hours. ?BNP (last 3 results) ?No results for input(s): PROBNP in the last 8760 hours. ?HbA1C: ?No results for input(s): HGBA1C in the last 72 hours. ? ?CBG: ?Recent Labs  ?Lab 09/16/21 ?0701 09/16/21 ?1109 09/16/21 ?1602 09/16/21 ?2117  09/17/21 ?2010  ?GLUCAP 120* 142* 142* 154* 118*  ? ? ?Lipid Profile: ?Recent Labs  ?  09/16/21 ?0140  ?CHOL 72  ?HDL <10*  ?LDLCALC NOT CALCULATED  ?TRIG 105  ?CHOLHDL NOT CALCULATED  ? ? ?Thyroid Function Tests: ?No results for input(s): TSH, T4TOTAL, FREET4, T3FREE, THYROIDAB in the last 72 hours. ?Anemia Panel: ?No results for input(s): VITAMINB12, FOLATE, FERRITIN, TIBC, IRON, RETICCTPCT in the last 72 hours. ?Sepsis Labs: ?Recent Labs  ?Lab 09/13/21 ?0712 09/13/21 ?0210  ?LATICACIDVEN 1.8 1.4  ? ? ? ?Recent Results (from the past 240 hour(s))  ?Resp Panel by RT-PCR (Flu A&B, Covid)     Status: None  ? Collection Time: 09/13/21 12:29 AM  ? Specimen: Nasopharyngeal(NP) swabs in vial transport medium  ?Result Value Ref Range Status  ? SARS Coronavirus 2 by RT PCR NEGATIVE NEGATIVE Final  ?  Comment: (NOTE) ?SARS-CoV-2 target nucleic acids are NOT DETECTED. ? ?The SARS-CoV-2 RNA is generally detectable in upper respiratory ?specimens during the acute phase of infection. The lowest ?concentration of  SARS-CoV-2 viral copies this assay can detect is ?138 copies/mL. A negative result does not preclude SARS-Cov-2 ?infection and should not be used as the sole basis for treatment or ?other patient management decisions. A negative result may occur with  ?improper specimen collection/handling, submission of specimen other ?than nasopharyngeal swab, presence of viral mutation(s) within the ?areas targeted by this assay, and inadequate number of viral ?copies(<138 copies/mL). A negative result must be combined with ?clinical observations, patient history, and epidemiological ?information. The expected result is Negative. ? ?Fact Sheet for Patients:  ?EntrepreneurPulse.com.au ? ?Fact Sheet for Healthcare Providers:  ?IncredibleEmployment.be ? ?This test is no t yet approved or cleared by the [place] FDA and  ?has been authorized for detection and/or diagnosis of SARS-CoV-2 by ?FDA  under an Emergency Use Authorization (EUA). This EUA will remain  ?in effect (meaning this test can be used) for the duration of the ?COVID-19 declaration under Section 564(b)(1) of the Act, 21 ?U.S.C.section 360bbb-3(b

## 2021-09-17 NOTE — Transfer of Care (Signed)
Immediate Anesthesia Transfer of Care Note ? ?Patient: [name] ? ?Procedure(s) Performed: IRRIGATION AND DEBRIDEMENT FOOT (Right) ? ?Patient Location: PACU ? ?Anesthesia Type:MAC ? ?Level of Consciousness: awake and drowsy ? ?Airway & Oxygen Therapy: Patient Spontanous Breathing ? ?Post-op Assessment: Report given to RN and Post -op Vital signs reviewed and stable ? ?Post vital signs: Reviewed and stable ? ?Last Vitals:  ?Vitals Value Taken Time  ?BP 131/65 09/17/21 1813  ?Temp    ?Pulse 87 09/17/21 1815  ?Resp 21 09/17/21 1815  ?SpO2 93 % 09/17/21 1815  ?Vitals shown include unvalidated device data. ? ?Last Pain:  ?Vitals:  ? 09/17/21 1620  ?TempSrc: Oral  ?PainSc: Asleep  ?   ? ?Patients Stated Pain Goal: 2 (09/15/21 1012) ? ?Complications: No notable events documented. ?

## 2021-09-17 NOTE — Brief Op Note (Signed)
09/12/2021 - 09/17/2021 ? ?5:27 PM ? ?PATIENT:  [name]  66 y.o. male ? ?PRE-OPERATIVE DIAGNOSIS:  Right foot diabetic infection ? ?POST-OPERATIVE DIAGNOSIS:  * No post-op diagnosis entered * ? ?PROCEDURE:  Procedure(s) with comments: ?IRRIGATION AND DEBRIDEMENT FOOT (Right) - Suregeon will do anesthesia block ? ?SURGEON:  Surgeon(s) and Role: ?   Lorenda Peck, MD - Primary ? ?PHYSICIAN ASSISTANT:  ? ?ASSISTANTS: none  ? ?ANESTHESIA:   local ? ?EBL: 10cc ? ?BLOOD ADMINISTERED:none ? ?DRAINS: none  ? ?LOCAL MEDICATIONS USED:  MARCAINE   , LIDOCAINE , and Amount: 20 ml ? ?SPECIMEN:  Source of Specimen:  Right foot wound  ? ?DISPOSITION OF SPECIMEN:   culture ? ?COUNTS:  YES ? ?TOURNIQUET:  * No tourniquets in log * ? ?DICTATION: .Note written in EPIC ? ?PLAN OF CARE: Admit to inpatient  ? ?PATIENT DISPOSITION:  PACU - hemodynamically stable. ?  ?Delay start of Pharmacological VTE agent (>24hrs) due to surgical blood loss or risk of bleeding: yes ? ?

## 2021-09-17 NOTE — H&P (Signed)
Anesthesia H&P Update: History and Physical Exam reviewed; patient is OK for planned anesthetic and procedure. ? ?

## 2021-09-18 ENCOUNTER — Encounter (HOSPITAL_COMMUNITY): Payer: Self-pay | Admitting: Podiatry

## 2021-09-18 DIAGNOSIS — E11628 Type 2 diabetes mellitus with other skin complications: Secondary | ICD-10-CM | POA: Diagnosis not present

## 2021-09-18 DIAGNOSIS — L03119 Cellulitis of unspecified part of limb: Secondary | ICD-10-CM | POA: Diagnosis not present

## 2021-09-18 LAB — CBC WITH DIFFERENTIAL/PLATELET
Abs Immature Granulocytes: 0.47 10*3/uL — ABNORMAL HIGH (ref 0.00–0.07)
Basophils Absolute: 0.1 10*3/uL (ref 0.0–0.1)
Basophils Relative: 0 %
Eosinophils Absolute: 0.2 10*3/uL (ref 0.0–0.5)
Eosinophils Relative: 1 %
HCT: 34 % — ABNORMAL LOW (ref 39.0–52.0)
Hemoglobin: 11 g/dL — ABNORMAL LOW (ref 13.0–17.0)
Immature Granulocytes: 3 %
Lymphocytes Relative: 12 %
Lymphs Abs: 1.8 10*3/uL (ref 0.7–4.0)
MCH: 27.5 pg (ref 26.0–34.0)
MCHC: 32.4 g/dL (ref 30.0–36.0)
MCV: 85 fL (ref 80.0–100.0)
Monocytes Absolute: 1.6 10*3/uL — ABNORMAL HIGH (ref 0.1–1.0)
Monocytes Relative: 11 %
Neutro Abs: 10.7 10*3/uL — ABNORMAL HIGH (ref 1.7–7.7)
Neutrophils Relative %: 73 %
Platelets: 596 10*3/uL — ABNORMAL HIGH (ref 150–400)
RBC: 4 MIL/uL — ABNORMAL LOW (ref 4.22–5.81)
RDW: 14.5 % (ref 11.5–15.5)
WBC: 14.8 10*3/uL — ABNORMAL HIGH (ref 4.0–10.5)
nRBC: 0 % (ref 0.0–0.2)

## 2021-09-18 LAB — CULTURE, BLOOD (ROUTINE X 2)
Culture: NO GROWTH
Culture: NO GROWTH
Special Requests: ADEQUATE

## 2021-09-18 LAB — GLUCOSE, CAPILLARY
Glucose-Capillary: 143 mg/dL — ABNORMAL HIGH (ref 70–99)
Glucose-Capillary: 137 mg/dL — ABNORMAL HIGH (ref 70–99)
Glucose-Capillary: 170 mg/dL — ABNORMAL HIGH (ref 70–99)
Glucose-Capillary: 158 mg/dL — ABNORMAL HIGH (ref 70–99)

## 2021-09-18 LAB — BASIC METABOLIC PANEL
Anion gap: 8 (ref 5–15)
BUN: 13 mg/dL (ref 8–23)
CO2: 19 mmol/L — ABNORMAL LOW (ref 22–32)
Calcium: 7.8 mg/dL — ABNORMAL LOW (ref 8.9–10.3)
Chloride: 105 mmol/L (ref 98–111)
Creatinine, Ser: 0.77 mg/dL (ref 0.61–1.24)
GFR, Estimated: >60 mL/min (ref >60)
Glucose, Bld: 178 mg/dL — ABNORMAL HIGH (ref 70–99)
Potassium: 3.8 mmol/L (ref 3.5–5.1)
Sodium: 132 mmol/L — ABNORMAL LOW (ref 135–145)

## 2021-09-18 LAB — AEROBIC/ANAEROBIC CULTURE W GRAM STAIN (SURGICAL/DEEP WOUND)

## 2021-09-18 MED ORDER — HYDRALAZINE HCL 20 MG/ML IJ SOLN: 10.0000 mg | Freq: Four times a day (QID) | INTRAMUSCULAR | Status: DC | PRN

## 2021-09-18 MED ORDER — VANCOMYCIN HCL IN DEXTROSE 1-5 GM/200ML-% IV SOLN
1000.0000 mg | Freq: Two times a day (BID) | INTRAVENOUS | Status: DC
  Administered 2021-09-18 – 2021-09-21 (×6): 1000 mg via INTRAVENOUS
  Filled 2021-09-18 (×7): qty 200

## 2021-09-18 MED ORDER — LISINOPRIL 20 MG PO TABS
20.0000 mg | ORAL_TABLET | Freq: Every day | ORAL | Status: DC
Start: 2021-09-18 — End: 2021-09-22
  Administered 2021-09-18 – 2021-09-22 (×4): 20 mg via ORAL
  Filled 2021-09-18 (×4): qty 1

## 2021-09-18 MED FILL — Sodium Chloride Irrigation Soln 0.9%: Qty: 3000 | Status: AC

## 2021-09-18 NOTE — Progress Notes (Signed)
Patient received from PACU, AO x4. Vitals stable at the time of arrival. Connected to tele, CCMD notified. Call bell within reach. Right Lower leg and Right foot covered with ACE wrap. Clean dry and intact. Right Posterior Tibial has strong doppler signal. Will continue to monitor.  ?

## 2021-09-18 NOTE — Progress Notes (Signed)
Physical Therapy Treatment ?Patient Details ?Name: [name] ?MRN: [id number] ?DOB: 11-27-1955 ?Today's Date: 09/18/2021 ? ? ?History of Present Illness 66 y/o male who presents on 09/13/21 due to right foot infection. 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 S/p R foot irrigation and debridement on 3/15. Plan for R TMA on 3/17. PMH includes DM, HTN, sleep apnea. ? ?  ?PT Comments  ? ? Pt received seated on BSC, agreeable for PT session with emphasis on transfer training and bed mobility. Pt c/o LBP and RLE pain, pt instructed on log roll transfer for return to supine for decreased strain on back given LBP. RN entering room to give PO meds at end of session, pt heels floated for pressure relief and HOB >30 deg. Pt continues to benefit from PT services to progress toward functional mobility goals. Per chart review plan for R TMA next session, pt may need PT re-eval and goal update post-op.  ?Recommendations for follow up therapy are one component of a multi-disciplinary discharge planning process, led by the attending physician.  Recommendations may be updated based on patient status, additional functional criteria and insurance authorization. ? ?Follow Up Recommendations ? Skilled nursing-short term rehab (<3 hours/day) ?  ?  ?Assistance Recommended at Discharge Frequent or constant Supervision/Assistance  ?Patient can return home with the following A lot of help with bathing/dressing/bathroom;Assistance with cooking/housework;Help with stairs or ramp for entrance;Two people to help with walking and/or transfers ?  ?Equipment Recommendations ? Rolling walker (2 wheels);BSC/3in1  ?  ?Recommendations for Other Services   ? ? ?  ?Precautions / Restrictions Precautions ?Precautions: Fall ?Precaution Comments: Supposed to have post op shoe  (not yet), OOB to chair for activity at this time per vascular (09/16/21) ?Restrictions ?Weight Bearing Restrictions: Yes ?RLE Weight Bearing: Non weight bearing ?Other Position/Activity Restrictions: Can do heel down WB if needed per vascular (09/16/21) for pivot transfers only  ?  ? ?Mobility ? Bed Mobility ?Overal bed mobility: Needs Assistance ?Bed Mobility: Sit to Sidelying, Rolling ?Rolling: Min guard ?  ?  ?  ?Sit to sidelying: Min assist ?General bed mobility comments: cues for log rolling due to c/o back pain; pt needs minA to raise BLE over EOB and mod cues for sequencing log roll safely ?  ? ?Transfers ?Overall transfer level: Needs assistance ?Equipment used: Rolling walker (2 wheels) ?Transfers: Sit to/from Stand, Bed to chair/wheelchair/BSC ?Sit to Stand: Mod assist, From elevated surface ?  ?Step pivot transfers: Min assist ?  ?  ?  ?General transfer comment: Continued Mod A to power up and weight shift forward into standing. Mod cues for sequencing/technique and minA for RW management and stability while pivoting to bed from [facility] ?  ? ?Ambulation/Gait ?  ?  ?  ?  ?  ?  ?  ?  ? ? ?Stairs ?  ?  ?  ?  ?  ? ? ?Wheelchair Mobility ?  ? ?Modified Rankin (Stroke Patients Only) ?  ? ? ?  ?Balance Overall balance assessment: Needs assistance ?Sitting-balance support: No upper extremity supported, Feet supported ?Sitting balance-Leahy Scale: Fair ?  ?  ?Standing balance support: During functional activity, Bilateral upper extremity supported ?Standing balance-Leahy Scale: Poor ?Standing balance comment: UE support on RW and min A for balance, total A for pericare; crouched posture, heavy cues for upright posture  needed ?  ?  ?  ?  ?  ?  ?  ?  ?  ?  ?  ?  ? ?  ?Cognition Arousal/Alertness: Awake/alert ?Behavior During Therapy: [facility] for tasks assessed/performed ?Overall Cognitive Status: Within Functional Limits for tasks assessed ?  ?  ?  ?  ?  ?  ?  ?  ?  ?  ?  ?  ?  ?  ?  ?  ?General Comments: Polite,  cooperative; increased time to initiate/perform tasks. ?  ?  ? ?  ?Exercises   ? ?  ?General Comments General comments (skin integrity, edema, etc.): SpO2 98% on RA (pt on RA seated on BSC on PTA arrival to room); HR WFL ?  ?  ? ?Pertinent Vitals/Pain Pain Assessment ?Pain Assessment: Faces ?Faces Pain Scale: Hurts even more ?Pain Location: LBP and RLE with movement, pain at groin/thigh incisions and foot ?Pain Descriptors / Indicators: Sore, Operative site guarding, Grimacing, Guarding ?Pain Intervention(s): Monitored during session, Repositioned, Patient requesting pain meds-RN notified, Limited activity within patient's tolerance  ? ? ?Home Living   ?  ?  ?  ?  ?  ?  ?  ?  ?  ?   ?  ?Prior Function    ?  ?  ?   ? ?PT Goals (current goals can now be found in the care plan section) Acute Rehab PT Goals ?Patient Stated Goal: to get better and go home ?PT Goal Formulation: With patient ?Time For Goal Achievement: 09/30/21 ?Progress towards PT goals: Progressing toward goals ? ?  ?Frequency ? ? ? Min 3X/week ? ? ? ?  ?PT Plan Current plan remains appropriate  ? ? ?Co-evaluation   ?  ?  ?  ?  ? ?  ?AM-PAC PT "6 Clicks" Mobility   ?Outcome Measure ? Help needed turning from your back to your side while in a flat bed without using bedrails?: A Little ?Help needed moving from lying on your back to sitting on the side of a flat bed without using bedrails?: A Lot ?Help needed moving to and from a bed to a chair (including a wheelchair)?: A Lot ?Help needed standing up from a chair using your arms (e.g., wheelchair or bedside chair)?: A Lot ?Help needed to walk in hospital room?: Total ?Help needed climbing 3-5 steps with a railing? : Total ?6 Click Score: 11 ? ?  ?End of Session Equipment Utilized During Treatment: Gait belt ?Activity Tolerance: Patient tolerated treatment well;Patient limited by pain ?Patient left: in bed;with call bell/phone within reach;with bed alarm set;with nursing/sitter in room (HOB >30 deg as he is  about to take PO meds) ?Nurse Communication: Mobility status;Patient requests pain meds;Weight bearing status;Precautions ?PT Visit Diagnosis: Pain;Difficulty in walking, not elsewhere classified (R26.2);Muscle weakness (generalized) (M62.81);Unsteadiness on feet (R26.81) ?Pain - Right/Left: Right ?Pain - part of body: Ankle and joints of foot ?  ? ? ?Time: 4665-9935 ?PT Time Calculation (min) (ACUTE ONLY): 18 min ? ?Charges:  $Therapeutic Activity: 8-22 mins          ?          ? ?Monroe Qin P., PTA ?Acute Rehabilitation Services ?Pager: [phone] ?Office: [phone]  ? ? ?Caral Whan M Shiv Shuey ?09/18/2021, 5:09 PM ? ?

## 2021-09-18 NOTE — Progress Notes (Signed)
Pt agreed to receiving Lovenox injection for the prevention of DVT despite his cultural beliefs in not consuming pork or pork-derived products. ? ?Raelyn Number, RN ? ?

## 2021-09-18 NOTE — Progress Notes (Signed)
?PROGRESS NOTE ? ? ? [name]  RNH:[id number] DOB: 04-06-1956 DOA: 09/12/2021 ?PCP: Harvie Junior, MD  ? ? ?Brief Narrative:  ?66 year old gentleman with history of type 2 diabetes on insulin, essential hypertension, peripheral neuropathy and obstructive sleep apnea presented with right foot redness progressed to blistering on the dorsum of the right foot and associated spreading inflammation rapidly progressing within 24 hours.  Patient does have history of nonhealing ulcer on the sole of the right foot.  Patient was seen by vascular surgery as outpatient and underwent right leg angiography and angioplasty by Dr. Unk Lightning on 3/8 for same.  In the emergency room patient with evidence of acute kidney injury, leukocytosis with WBC count 21.8 and thrombocytosis.  Blood cultures drawn.  Started on broad-spectrum antibiotics and admitted to the hospital. ?Patient was already scheduled to have surgery/ Bypass ? next week. ? ? ?Assessment & Plan: ?  ?Cellulitis and abscess of the right foot, diabetic foot infection, sepsis present on admission with leukocytosis, tachycardia and acute renal failure: ?Patient was started on IV vancomycin and cefepime for severe nonpurulent cellulitis.  Blood cultures negative so far.  Seen by vascular surgery.  Underwent right leg below-knee popliteal artery to posterior tibial artery bypass using nonreversed greater saphenous vein and fourth metatarsal ray amputation, left open on 09/15/2021.  Also underwent irrigation and debridement of the right foot by podiatry on 09/17/2021.  Now they are planning to do transmetatarsal amputation tomorrow.  Patient is agreeable.  Continue antibiotics.  ? ?Acute kidney injury: Recent known normal renal function 1.08-1.2.  Creatinine 2.14 on  presentation. Consistent with ATN from sepsis.  Resolved and renal function back to normal now. ? ?Essential hypertension: Blood pressure now rising.  We will resume lisinopril at lower dose of 20 mg but  hold rest of the antihypertensives.  Placed on as needed hydralazine. ? ?Type 2 diabetes, uncontrolled with hyperglycemia.  Blood sugar controlled.  Continue Semglee 10 units and SSI. ? ?Obstructive sleep apnea: Not using CPAP at home as he could not tolerate that. ? ?GERD: On PPI.  Continue. ? ? ?DVT prophylaxis: SCD's Start: 09/15/21 1734 ?enoxaparin (LOVENOX) injection 40 mg Start: 09/13/21 1000 ? ? ?Code Status: Full code ?Family Communication: None at the bedside. ?Disposition Plan: Status is: Inpatient ?Remains inpatient appropriate because: Pending procedure tomorrow ? ?Consultants:  ?Vascular surgery ? ?Procedures:  ?As above ? ?Antimicrobials:  ?Vancomycin and cefepime 3/10---- ? ? ?Subjective: ? ?Seen and examined.  He was very drowsy and sleepy.  Easily arousable.  He said that he received pain medications and that is why he is sleepy.  He had no complaints. ? ?Objective: ?Vitals:  ? 09/17/21 2311 09/18/21 0400 09/18/21 0815 09/18/21 0900  ?BP: (!) 148/75 (!) 152/76 (!) 155/70 (!) 155/70  ?Pulse: 95 81 85 84  ?Resp: '16 17 16 16  '$ ?Temp: 98.8 ?F (37.1 ?C) 98.1 ?F (36.7 ?C) 99.7 ?F (37.6 ?C) 99.7 ?F (37.6 ?C)  ?TempSrc: Oral Oral Oral Oral  ?SpO2: 98% 97% 94% 99%  ?Weight:      ?Height:      ? ? ?Intake/Output Summary (Last 24 hours) at 09/18/2021 1035 ?Last data filed at 09/18/2021 3832 ?Gross per 24 hour  ?Intake 1852.63 ml  ?Output 3885 ml  ?Net -2032.37 ml  ? ? ?Filed Weights  ? 09/12/21 2256 09/14/21 0442 09/14/21 2018  ?Weight: 106.1 kg 102.6 kg 102 kg  ? ? ?Examination: ? ?General exam: Appears calm and comfortable but sleepy ?Respiratory system: Clear to  auscultation. Respiratory effort normal. ?Cardiovascular system: S1 & S2 heard, RRR. No JVD, murmurs, rubs, gallops or clicks. No pedal edema. ?Gastrointestinal system: Abdomen is nondistended, soft and nontender. No organomegaly or masses felt. Normal bowel sounds heard. ?Central nervous system: Sleepy but oriented. No focal neurological  deficits. ?Extremities: Dressing in the right lower extremity. ? ? ?Data Reviewed: I have personally reviewed following labs and imaging studies ? ?CBC: ?Recent Labs  ?Lab 09/14/21 ?2355 09/15/21 ?7322 09/16/21 ?0140 09/17/21 ?0254 09/18/21 ?0152  ?WBC 20.3* 24.0* 20.5* 16.3* 14.8*  ?NEUTROABS 15.2* 17.9* 15.6* 12.2* 10.7*  ?HGB 11.7* 12.5* 11.3* 11.4* 11.0*  ?HCT 35.3* 38.0* 35.4* 33.9* 34.0*  ?MCV 84.4 84.3 87.0 85.0 85.0  ?PLT 559* 525* 589* 557* 596*  ? ? ?Basic Metabolic Panel: ?Recent Labs  ?Lab 09/13/21 ?0303 09/14/21 ?2706 09/15/21 ?2376 09/16/21 ?0140 09/18/21 ?0152  ?NA 132* 133* 136 136 132*  ?K 3.8 3.8 4.1 4.6 3.8  ?CL 97* 102 106 108 105  ?CO2 21* 20* 18* 17* 19*  ?GLUCOSE 200* 134* 115* 104* 178*  ?BUN 27* '19 14 16 13  '$ ?CREATININE 2.14* 1.32* 1.18 1.08 0.77  ?CALCIUM 8.4* 8.1* 8.3* 8.0* 7.8*  ?MG  --  2.0  --   --   --   ?PHOS  --  2.3*  --   --   --   ? ? ?GFR: ?Estimated Creatinine Clearance: 109.3 mL/min (by C-G formula based on SCr of 0.77 mg/dL). ?Liver Function Tests: ?Recent Labs  ?Lab 09/12/21 ?2309  ?AST 27  ?ALT 32  ?ALKPHOS 83  ?BILITOT 0.6  ?PROT 8.0  ?ALBUMIN 3.0*  ? ? ?No results for input(s): LIPASE, AMYLASE in the last 168 hours. ?No results for input(s): AMMONIA in the last 168 hours. ?Coagulation Profile: ?No results for input(s): INR, PROTIME in the last 168 hours. ?Cardiac Enzymes: ?No results for input(s): CKTOTAL, CKMB, CKMBINDEX, TROPONINI in the last 168 hours. ?BNP (last 3 results) ?No results for input(s): PROBNP in the last 8760 hours. ?HbA1C: ?No results for input(s): HGBA1C in the last 72 hours. ? ?CBG: ?Recent Labs  ?Lab 09/17/21 ?1121 09/17/21 ?1611 09/17/21 ?1814 09/17/21 ?2106 09/18/21 ?2831  ?GLUCAP 156* 142* 111* 186* 143*  ? ? ?Lipid Profile: ?Recent Labs  ?  09/16/21 ?0140  ?CHOL 72  ?HDL <10*  ?LDLCALC NOT CALCULATED  ?TRIG 105  ?CHOLHDL NOT CALCULATED  ? ? ?Thyroid Function Tests: ?No results for input(s): TSH, T4TOTAL, FREET4, T3FREE, THYROIDAB in the last 72  hours. ?Anemia Panel: ?No results for input(s): VITAMINB12, FOLATE, FERRITIN, TIBC, IRON, RETICCTPCT in the last 72 hours. ?Sepsis Labs: ?Recent Labs  ?Lab 09/13/21 ?5176 09/13/21 ?0210  ?LATICACIDVEN 1.8 1.4  ? ? ? ?Recent Results (from the past 240 hour(s))  ?Resp Panel by RT-PCR (Flu A&B, Covid)     Status: None  ? Collection Time: 09/13/21 12:29 AM  ? Specimen: Nasopharyngeal(NP) swabs in vial transport medium  ?Result Value Ref Range Status  ? SARS Coronavirus 2 by RT PCR NEGATIVE NEGATIVE Final  ?  Comment: (NOTE) ?SARS-CoV-2 target nucleic acids are NOT DETECTED. ? ?The SARS-CoV-2 RNA is generally detectable in upper respiratory ?specimens during the acute phase of infection. The lowest ?concentration of SARS-CoV-2 viral copies this assay can detect is ?138 copies/mL. A negative result does not preclude SARS-Cov-2 ?infection and should not be used as the sole basis for treatment or ?other patient management decisions. A negative result may occur with  ?improper specimen collection/handling, submission of specimen other ?than nasopharyngeal  swab, presence of viral mutation(s) within the ?areas targeted by this assay, and inadequate number of viral ?copies(<138 copies/mL). A negative result must be combined with ?clinical observations, patient history, and epidemiological ?information. The expected result is Negative. ? ?Fact Sheet for Patients:  ?EntrepreneurPulse.com.au ? ?Fact Sheet for Healthcare Providers:  ?IncredibleEmployment.be ? ?This test is no t yet approved or cleared by the [place] FDA and  ?has been authorized for detection and/or diagnosis of SARS-CoV-2 by ?FDA under an Emergency Use Authorization (EUA). This EUA will remain  ?in effect (meaning this test can be used) for the duration of the ?COVID-19 declaration under Section 564(b)(1) of the Act, 21 ?U.S.C.section 360bbb-3(b)(1), unless the authorization is terminated  ?or revoked sooner.  ? ? ?  ?  Influenza A by PCR NEGATIVE NEGATIVE Final  ? Influenza B by PCR NEGATIVE NEGATIVE Final  ?  Comment: (NOTE) ?The Xpert Xpress SARS-CoV-2/FLU/RSV plus assay is intended as an aid ?in the diagnosis of influenza from [place]

## 2021-09-18 NOTE — Progress Notes (Signed)
Pharmacy Antibiotic Note ? ?[name] is a 66 y.o. male admitted on 09/12/2021 with  cellulitis/wound infection of foot. Patient likely to require revascularization.  Pharmacy has been consulted for Vancomycin/Zosyn dosing.  ? ?WBC down 14, afebrile. Scr 1.85>2.14>1.32>1.08>0.77. ? ?Planning right TMA tomorrow 3/17. No significant growth in cultures, although new cultures obtained in OR 3/16.  ? ?Plan: ?Vancomycin 1g q12 hours ?- with improved renal function, calculated vancomycin dose 1g IV Q12H (eAUC ~445, Scr used 0.77, Vd 0.5) ?Cefepime 2g IV q12h>>2g IV Q8H  ?De-escalate when able, F/U renal fxn, VVS recs  ?Obtain drug levels as indicated  ? ?Height: 6' (182.9 cm) ?Weight: 102 kg (224 lb 13.9 oz) ?IBW/kg (Calculated) : 77.6 ? ?Temp (24hrs), Avg:98.5 ?F (36.9 ?C), Min:97.8 ?F (36.6 ?C), Max:99.7 ?F (37.6 ?C) ? ?Recent Labs  ?Lab 09/13/21 ?0102 09/13/21 ?0210 09/13/21 ?0303 09/14/21 ?7253 09/15/21 ?6644 09/16/21 ?0140 09/17/21 ?0347 09/18/21 ?0152  ?WBC  --   --  20.1* 20.3* 24.0* 20.5* 16.3* 14.8*  ?CREATININE  --   --  2.14* 1.32* 1.18 1.08  --  0.77  ?LATICACIDVEN 1.8 1.4  --   --   --   --   --   --   ? ?  ?Estimated Creatinine Clearance: 109.3 mL/min (by C-G formula based on SCr of 0.77 mg/dL).   ? ?Allergies  ?Allergen Reactions  ? Gemfibrozil Other (See Comments)  ?  Doesn't know its been awhile  ? Oxycontin [Oxycodone Hcl] Other (See Comments)  ?  Bad reaction "took me to a place I never wanna go again"  ? Pork-Derived Products Other (See Comments)  ?  Pt does not eat pork due to culture reasons  ? Simvastatin Other (See Comments)  ?  Says he doesn't know its been awhile  ? ?Dose adjustments this admission:  ?Cefepime 2g IV Q12H>2g IV Q8H (09/14/21)  ?Vancomycin to '1500mg'$  q24 hours ? ?Microbiology results: ?3/11 Bcx: ngtd ?3/13 Rt toe: few gpc>>holding for possible anaerobe ?3/15 bone: ngtd ?3/15 wound: reincubate ? ?Antimicrobials this admission:  ?Vancomycin 3/11>>  ?Cefepime 3/11>> ?Flagyl  3/11>> ?Zosyn 3/11 x1  ? ?Erin Hearing PharmD., BCPS ?Clinical Pharmacist ?09/18/2021 8:43 AM ? ?

## 2021-09-18 NOTE — Progress Notes (Addendum)
Occupational Therapy Treatment ?Patient Details ?Name: [name] ?MRN: [id number] ?DOB: [birth year] ?Today's Date: 09/18/2021 ? ? ?History of present illness 66 y/o male who presents on 09/13/21 due to right foot infection. 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 S/p R foot irrigation and debridement on 3/15. PMH includes DM, HTN, sleep apnea. ?  ?OT comments ? Upon arrival, pt lethargic, supine in bed, and fiance at bedside reporting he needs to use toilet. Despite drowsiness, pt very agreeable to OOB and participation in therapy. Pt performing bed mobility with Min A and use of bedrails. Pt performing sit<>stand with Mod A and then Min A for pivot to [facility]. Continue to recommend dc to SNF and will continue to follow acutely.   ? ?Recommendations for follow up therapy are one component of a multi-disciplinary discharge planning process, led by the attending physician.  Recommendations may be updated based on patient status, additional functional criteria and insurance authorization. ?   ?Follow Up Recommendations ? Skilled nursing-short term rehab (<3 hours/day) (Pending progress)  ?  ?Assistance Recommended at Discharge Frequent or constant Supervision/Assistance  ?Patient can return home with the following ? A lot of help with bathing/dressing/bathroom;A lot of help with walking and/or transfers ?  ?Equipment Recommendations ? Other (comment) (Defer to next venue)  ?  ?Recommendations for Other Services PT consult ? ?  ?Precautions / Restrictions Precautions ?Precautions: Fall ?Precaution Comments: Supposed to have post op shoe (not yet), OOB to chair for activity at this time per vascular (09/16/21) ?Restrictions ?Weight Bearing Restrictions: Yes ?Other Position/Activity Restrictions: Can do heel down WB if needed per vascular (09/16/21)   ? ? ?  ? ?Mobility Bed Mobility ?Overal bed mobility: Needs Assistance ?Bed Mobility: Supine to Sit ?  ?  ?Supine to sit: Min assist, HOB elevated ?  ?  ?General bed mobility comments: Pt bringing BLEs towards EOB and then using bedrails to pull into upright posutre at EOB. Min A for elevating trunk. ?  ? ?Transfers ?Overall transfer level: Needs assistance ?Equipment used: Rolling walker (2 wheels) ?Transfers: Sit to/from Stand, Bed to chair/wheelchair/BSC ?Sit to Stand: Mod assist, From elevated surface ?  ?  ?Step pivot transfers: Min assist ?  ?  ?General transfer comment: Mod A to power up and weight shift forward into standing. Light Min A for maintaining balance during step pivot to BSC. ?  ?  ?Balance Overall balance assessment: Needs assistance ?Sitting-balance support: No upper extremity supported, Feet supported ?Sitting balance-Leahy Scale: Fair ?  ?  ?Standing balance support: During functional activity, Bilateral upper extremity supported ?Standing balance-Leahy Scale: Poor ?  ?  ?  ?  ?  ?  ?  ?  ?  ?  ?  ?  ?   ? ?ADL either performed or assessed with clinical judgement  ? ?ADL Overall ADL's : Needs assistance/impaired ?  ?  ?  ?  ?  ?  ?  ?  ?  ?  ?  ?  ?Toilet Transfer: Minimal assistance;Moderate assistance;Stand-pivot;BSC/3in1;Rolling walker (2 wheels) ?Toilet Transfer Details (indicate cue type and reason): Mod A for power up into standing. Min A for balance during pivot to [facility]. ?  ?  ?  ?  ?Functional mobility during ADLs: Moderate assistance;Minimal assistance;Rolling walker (2 wheels) ?General ADL Comments: Pt very agreeable to OOB and participation  in therapy. Pt performing stand pivot to [facility]. Presenting with decreased balance, strength, and arousal. ?  ? ?Extremity/Trunk Assessment Upper Extremity Assessment ?Upper Extremity Assessment: Overall WFL for tasks assessed ?  ?Lower Extremity Assessment ?Lower Extremity Assessment: Defer to PT evaluation ?  ?  ?  ? ?Vision   ?  ?  ?Perception    ?  ?Praxis   ?  ? ?Cognition Arousal/Alertness: Lethargic, Suspect due to medications ?Behavior During Therapy: [facility] for tasks assessed/performed ?Overall Cognitive Status: Within Functional Limits for tasks assessed ?  ?  ?  ?  ?  ?  ?  ?  ?  ?  ?  ?  ?  ?  ?  ?  ?General Comments: Requiring increased time and cues. Lethargic and drowsy ?  ?  ?   ?Exercises   ? ?  ?Shoulder Instructions   ? ? ?  ?General Comments Fiance present during session. VSS on RA  ? ? ?Pertinent Vitals/ Pain       Pain Assessment ?Pain Assessment: Faces ?Faces Pain Scale: Hurts even more ?Pain Location: RLE with movement and groin ?Pain Descriptors / Indicators: Sore, Operative site guarding, Grimacing, Guarding ?Pain Intervention(s): Monitored during session, Limited activity within patient's tolerance, Repositioned ? ?Home Living   ?  ?  ?  ?  ?  ?  ?  ?  ?  ?  ?  ?  ?  ?  ?  ?  ?  ?  ? ?  ?Prior Functioning/Environment    ?  ?  ?  ?   ? ?Frequency ? Min 2X/week  ? ? ? ? ?  ?Progress Toward Goals ? ?OT Goals(current goals can now be found in the care plan section) ? Progress towards OT goals: Progressing toward goals ? ?Acute Rehab OT Goals ?OT Goal Formulation: With patient ?Time For Goal Achievement: 09/30/21 ?Potential to Achieve Goals: Good ?ADL Goals ?Pt Will Perform Lower Body Dressing: sit to/from stand;with min guard assist ?Pt Will Transfer to Toilet: with min guard assist;bedside commode;ambulating ?Pt Will Perform Toileting - Clothing Manipulation and hygiene: with supervision;sitting/lateral leans;sit to/from stand ?Additional ADL Goal #1: Pt will perform bed mobility with Supervision in preparation for ADLs  ?Plan Discharge plan remains appropriate   ? ?Co-evaluation ? ? ?   ?  ?  ?  ?  ? ?  ?AM-PAC OT "6 Clicks" Daily Activity     ?Outcome Measure ? ? Help from another person eating meals?: None ?Help from another person taking care of personal grooming?: A Little ?Help from another person toileting, which includes using  toliet, bedpan, or urinal?: A Lot ?Help from another person bathing (including washing, rinsing, drying)?: A Lot ?Help from another person to put on and taking off regular upper body clothing?: A Little ?Help from another person to put on and taking off regular lower body clothing?: A Lot ?6 Click Score: 16 ? ?  ?End of Session Equipment Utilized During Treatment: Gait belt;Rolling walker (2 wheels) ? ?OT Visit Diagnosis: Unsteadiness on feet (R26.81);Other abnormalities of gait and mobility (R26.89);Muscle weakness (generalized) (M62.81) ?  ?Activity Tolerance Patient tolerated treatment well ?  ?Patient Left Other (comment) (BSC) ?  ?Nurse Communication Mobility status ?  ? ?   ? ?Time: 0350-0938 ?OT Time Calculation (min): 19 min ? ?Charges: OT General Charges ?$OT Visit: 1 Visit ?OT Treatments ?$Self Care/Home Management : 8-22 mins ? ?Silvio Sausedo MSOT, OTR/L ?Acute Rehab ?Pager: [phone] ?Office: [phone] ? ?Linden Mikes  M Benjamin Merrihew ?09/18/2021, 12:48 PM ? ? ?

## 2021-09-18 NOTE — Plan of Care (Signed)

## 2021-09-18 NOTE — Progress Notes (Addendum)
?  Progress Note ? ? ? ?09/18/2021 ?7:46 AM ?1 Day Post-Op ? ?Subjective:  says he is having an amputation of his toes tomorrow.  Says he's not ready for this.  ? ?Afebrile ? ? ?Vitals:  ? 09/17/21 2311 09/18/21 0400  ?BP: (!) 148/75 (!) 152/76  ?Pulse: 95 81  ?Resp: 16 17  ?Temp: 98.8 ?F (37.1 ?C) 98.1 ?F (36.7 ?C)  ?SpO2: 98% 97%  ? ? ?Physical Exam: ?Cardiac:  regular ?Lungs:  non labored ?Incisions:  right leg incisions look good ?Extremities:  brisk right PT doppler signal ?Abdomen:  soft ? ?CBC ?   ?Component Value Date/Time  ? WBC 14.8 (H) 09/18/2021 0152  ? RBC 4.00 (L) 09/18/2021 0152  ? HGB 11.0 (L) 09/18/2021 0152  ? HCT 34.0 (L) 09/18/2021 0152  ? PLT 596 (H) 09/18/2021 0152  ? MCV 85.0 09/18/2021 0152  ? MCH 27.5 09/18/2021 0152  ? MCHC 32.4 09/18/2021 0152  ? RDW 14.5 09/18/2021 0152  ? LYMPHSABS 1.8 09/18/2021 0152  ? MONOABS 1.6 (H) 09/18/2021 0152  ? EOSABS 0.2 09/18/2021 0152  ? BASOSABS 0.1 09/18/2021 0152  ? ? ?BMET ?   ?Component Value Date/Time  ? NA 132 (L) 09/18/2021 0152  ? K 3.8 09/18/2021 0152  ? CL 105 09/18/2021 0152  ? CO2 19 (L) 09/18/2021 0152  ? GLUCOSE 178 (H) 09/18/2021 0152  ? BUN 13 09/18/2021 0152  ? CREATININE 0.77 09/18/2021 0152  ? CALCIUM 7.8 (L) 09/18/2021 0152  ? GFRNONAA >60 09/18/2021 0152  ? GFRAA >60 02/17/2015 2030  ? ? ?INR ?No results found for: INR ? ? ?Intake/Output Summary (Last 24 hours) at 09/18/2021 0746 ?Last data filed at 09/18/2021 4401 ?Gross per 24 hour  ?Intake 1852.63 ml  ?Output 3885 ml  ?Net -2032.37 ml  ? ? ? ?Assessment/Plan:  66 y.o. male is s/p:  ?Right leg below-knee popliteal artery to posterior tibial artery bypass using nonreversed greater saphenous vein and fourth metatarsal ray amputation, left open ?3 Days Post-Op  ?And ?Right foot irrigation and debridement by Dr. Blenda Mounts ?1 Day Post-Op ? ? ?-pt continues to have brisk right PT doppler signal ?-dressing changes right foot per podiatry.  She is planning right TMA tomorrow.   ?-leukocytosis  improving ? ? ? ?Leontine Locket, PA-C ?[facility] ?[phone] ?09/18/2021 ?7:46 AM ? ?VASCULAR STAFF ADDENDUM: ?I have independently interviewed and examined the patient. ?I agree with the above.  ?Bypass with excellent flow.  ?Appreciate care from hospital medicine and podiatry.  ? ?J. Melene Muller, MD ?[facility] ?Office Phone Number: [phone] ?09/18/2021 10:25 AM ? ? ? ?

## 2021-09-18 NOTE — Progress Notes (Addendum)
Occupational Therapy Treatment ?Patient Details ?Name: [name] ?MRN: [id number] ?DOB: 1955/11/13 ?Today's Date: 09/18/2021 ? ? ?History of present illness 66 y/o male who presents on 09/13/21 due to right foot infection. 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 S/p R foot irrigation and debridement on 3/15. PMH includes DM, HTN, sleep apnea. ?  ?OT comments ? Return to complete toileting. Pt with successful BM (liquid soft stool; notified RN). Pt performing Mod A for power up into standing and then Min guard A for balance while completing peri care. Pt then completing pivot to recliner; maintaining WB at R heel only. Pt more awake now OOB and very thankful for therapy session. Continue to recommend dc to SNF and will continue to follow acutely as admitted.   ? ?Recommendations for follow up therapy are one component of a multi-disciplinary discharge planning process, led by the attending physician.  Recommendations may be updated based on patient status, additional functional criteria and insurance authorization. ?   ?Follow Up Recommendations ? Skilled nursing-short term rehab (<3 hours/day) (Pending progress)  ?  ?Assistance Recommended at Discharge Frequent or constant Supervision/Assistance  ?Patient can return home with the following ? A lot of help with bathing/dressing/bathroom;A lot of help with walking and/or transfers ?  ?Equipment Recommendations ? Other (comment) (Defer to next venue)  ?  ?Recommendations for Other Services PT consult ? ?  ?Precautions / Restrictions Precautions ?Precautions: Fall ?Precaution Comments: Supposed to have post op shoe (not yet), OOB to chair for activity at this time per vascular (09/16/21) ?Restrictions ?Weight Bearing Restrictions: Yes ?Other Position/Activity Restrictions: Can do heel down  WB if needed per vascular (09/16/21)  ? ? ?  ? ?Mobility Bed Mobility ?Overal bed mobility: Needs Assistance ?Bed Mobility: Supine to Sit ?  ?  ?Supine to sit: Min assist, HOB elevated ?  ?  ?General bed mobility comments: at [facility] upon arrival ?  ? ?Transfers ?Overall transfer level: Needs assistance ?Equipment used: Rolling walker (2 wheels) ?Transfers: Sit to/from Stand, Bed to chair/wheelchair/BSC ?Sit to Stand: Mod assist, From elevated surface ?  ?  ?Step pivot transfers: Min guard ?  ?  ?General transfer comment: Continued Mod A to power up and weight shift forward into standing.Min guard A for maintaining balance during step pivot to recliner. ?  ?  ?Balance Overall balance assessment: Needs assistance ?Sitting-balance support: No upper extremity supported, Feet supported ?Sitting balance-Leahy Scale: Fair ?  ?  ?Standing balance support: During functional activity, Bilateral upper extremity supported ?Standing balance-Leahy Scale: Poor ?  ?  ?  ?  ?  ?  ?  ?  ?  ?  ?  ?  ?   ? ?ADL either performed or assessed with clinical judgement  ? ?ADL Overall ADL's : Needs assistance/impaired ?  ?  ?  ?  ?  ?  ?  ?  ?  ?  ?  ?  ?Toilet Transfer: BSC/3in1;Rolling walker (2 wheels);Min guard;Moderate assistance;Stand-pivot ?Toilet Transfer Details (indicate cue type and reason): Mod A for power up and then Min Guard A for step pivot to recliner ?Toileting- Water quality scientist and Hygiene: Min guard;Sit to/from stand ?Toileting - Clothing Manipulation Details (indicate cue type and reason): Min guard A for safety in standing while pt performing posterior peri care ?  ?  ?Functional mobility during ADLs:  Rolling walker (2 wheels);Min guard;Moderate assistance ?General ADL Comments: Pt performing toileting with Min-Mod A. demonstrating increased balance and maintaining WB status with heel only at RLE ?  ? ?Extremity/Trunk Assessment Upper Extremity Assessment ?Upper Extremity Assessment: Overall WFL for tasks assessed ?   ?Lower Extremity Assessment ?Lower Extremity Assessment: Defer to PT evaluation ?  ?  ?  ? ?Vision   ?  ?  ?Perception   ?  ?Praxis   ?  ? ?Cognition Arousal/Alertness: Awake/alert ?Behavior During Therapy: [facility] for tasks assessed/performed ?Overall Cognitive Status: Within Functional Limits for tasks assessed ?  ?  ?  ?  ?  ?  ?  ?  ?  ?  ?  ?  ?  ?  ?  ?  ?General Comments: More awake now OOB. Continues to present increased time ?  ?  ?   ?Exercises   ? ?  ?Shoulder Instructions   ? ? ?  ?General Comments VSS  ? ? ?Pertinent Vitals/ Pain       Pain Assessment ?Pain Assessment: Faces ?Faces Pain Scale: Hurts little more ?Pain Location: RLE with movement and groin ?Pain Descriptors / Indicators: Sore, Operative site guarding, Grimacing, Guarding ?Pain Intervention(s): Monitored during session, Limited activity within patient's tolerance, Repositioned ? ?Home Living   ?  ?  ?  ?  ?  ?  ?  ?  ?  ?  ?  ?  ?  ?  ?  ?  ?  ?  ? ?  ?Prior Functioning/Environment    ?  ?  ?  ?   ? ?Frequency ? Min 2X/week  ? ? ? ? ?  ?Progress Toward Goals ? ?OT Goals(current goals can now be found in the care plan section) ? Progress towards OT goals: Progressing toward goals ? ?Acute Rehab OT Goals ?OT Goal Formulation: With patient ?Time For Goal Achievement: 09/30/21 ?Potential to Achieve Goals: Good ?ADL Goals ?Pt Will Perform Lower Body Dressing: sit to/from stand;with min guard assist ?Pt Will Transfer to Toilet: with min guard assist;bedside commode;ambulating ?Pt Will Perform Toileting - Clothing Manipulation and hygiene: with supervision;sitting/lateral leans;sit to/from stand ?Additional ADL Goal #1: Pt will perform bed mobility with Supervision in preparation for ADLs  ?Plan Discharge plan remains appropriate   ? ?Co-evaluation ? ? ?   ?  ?  ?  ?  ? ?  ?AM-PAC OT "6 Clicks" Daily Activity     ?Outcome Measure ? ? Help from another person eating meals?: None ?Help from another person taking care of personal grooming?: A  Little ?Help from another person toileting, which includes using toliet, bedpan, or urinal?: A Lot ?Help from another person bathing (including washing, rinsing, drying)?: A Lot ?Help from another person to put on and taking off regular upper body clothing?: A Little ?Help from another person to put on and taking off regular lower body clothing?: A Lot ?6 Click Score: 16 ? ?  ?End of Session Equipment Utilized During Treatment: Gait belt;Rolling walker (2 wheels) ? ?OT Visit Diagnosis: Unsteadiness on feet (R26.81);Other abnormalities of gait and mobility (R26.89);Muscle weakness (generalized) (M62.81) ?  ?Activity Tolerance Patient tolerated treatment well ?  ?Patient Left in chair;with call bell/phone within reach;with chair alarm set;with family/visitor present;with nursing/sitter in room ?  ?Nurse Communication Mobility status ?  ? ?   ? ?Time: 3785-8850 ?OT Time Calculation (min): 13 min ? ?Charges: OT General Charges ?$OT Visit: 1 Visit ?OT Treatments ?$Self Care/Home Management : 8-22  mins ? ?Cherrell Maybee MSOT, OTR/L ?Acute Rehab ?Pager: [phone] ?Office: [phone] ? ?Allena Pietila M [place]levy ?09/18/2021, 12:52 PM ? ? ?

## 2021-09-18 NOTE — Progress Notes (Signed)
?Subjective:  ?Patient ID: [name], male    DOB: Apr 08, 1956,  MRN: [id number] ? ?Patient seen at bedside this morning s/p I&D of right foot. Patient relates some pain this morning but managing. Denies n/v/f/c.  ? ?Past Medical History:  ?Diagnosis Date  ? Diabetes mellitus without complication ([place])   ? Hypertension   ? Neuropathy   ? Sleep apnea   ?  ? ?Past Surgical History:  ?Procedure Laterality Date  ? ABDOMINAL AORTOGRAM W/LOWER EXTREMITY Right 09/10/2021  ? Procedure: ABDOMINAL AORTOGRAM W/LOWER EXTREMITY;  Surgeon: [name], MD;  Location: [place] [facility];  Service: Cardiovascular;  Laterality: Right;  ? ABDOMINAL HERNIA REPAIR    ? AMPUTATION TOE Right 09/15/2021  ? Procedure: AMPUTATION 4th TOE;  Surgeon: [name], MD;  Location: [place];  Service: Vascular;  Laterality: Right;  ? BACK SURGERY    ? FEMORAL-TIBIAL BYPASS GRAFT Right 09/15/2021  ? Procedure: BYPASS GRAFT BELOW KNEE POPLITEAL ARTERY TO POSTERIOR TIBIAL ARTERY;  Surgeon: [name], MD;  Location: [place];  Service: Vascular;  Laterality: Right;  ? IRRIGATION AND DEBRIDEMENT FOOT Right 09/17/2021  ? Procedure: IRRIGATION AND DEBRIDEMENT FOOT;  Surgeon: Lorenda Peck, MD;  Location: [place];  Service: Podiatry;  Laterality: Right;  Suregeon will do anesthesia block  ? KNEE SURGERY    ? PERIPHERAL VASCULAR BALLOON ANGIOPLASTY  09/10/2021  ? Procedure: PERIPHERAL VASCULAR BALLOON ANGIOPLASTY;  Surgeon: [name], MD;  Location: [place] [facility];  Service: Cardiovascular;;  rt sfa pta  ? SHOULDER SURGERY    ? VEIN HARVEST Right 09/15/2021  ? Procedure: GREATER SAPHENOUS VEIN HARVEST;  Surgeon: [name], MD;  Location: [facility];  Service: Vascular;  Laterality: Right;  ? ? ?CBC Latest Ref Rng & Units 09/18/2021 09/17/2021 09/16/2021  ?WBC 4.0 - 10.5 K/uL 14.8(H) 16.3(H) 20.5(H)  ?Hemoglobin 13.0 - 17.0 g/dL 11.0(L) 11.4(L) 11.3(L)  ?Hematocrit 39.0 - 52.0 % 34.0(L) 33.9(L) 35.4(L)  ?Platelets 150 - 400 K/uL 596(H)  557(H) 589(H)  ? ? ?BMP Latest Ref Rng & Units 09/18/2021 09/16/2021 09/15/2021  ?Glucose 70 - 99 mg/dL 178(H) 104(H) 115(H)  ?BUN 8 - 23 mg/dL '13 16 14  '$ ?Creatinine 0.61 - 1.24 mg/dL 0.77 1.08 1.18  ?Sodium 135 - 145 mmol/L 132(L) 136 136  ?Potassium 3.5 - 5.1 mmol/L 3.8 4.6 4.1  ?Chloride 98 - 111 mmol/L 105 108 106  ?CO2 22 - 32 mmol/L 19(L) 17(L) 18(L)  ?Calcium 8.9 - 10.3 mg/dL 7.8(L) 8.0(L) 8.3(L)  ? ? ? ?Objective:  ? ?Vitals:  ? 09/17/21 2311 09/18/21 0400  ?BP: (!) 148/75 (!) 152/76  ?Pulse: 95 81  ?Resp: 16 17  ?Temp: 98.8 ?F (37.1 ?C) 98.1 ?F (36.7 ?C)  ?SpO2: 98% 97%  ? ? ?General:AA&O x 3. Normal mood and affect  ? ?Vascular: DP and PT pulses 2/4 bilateral.  ? ?Neruological. Epicritic sensation grossly intact.  ? ?Derm: Wound to dorsal and plantar right foot about 7 cm x 5 cm fibro granular base with areas of necrosis.  No purulence noted today. Mild erythema and edema present surrounding.  ? ?MSK: MMT 5/5 in dorsiflexion, plantar flexion, inversion and eversion. Normal joint ROM without pain or crepitus.  ? ? ? ? ?Assessment & Plan:  ?Patient was evaluated and treated and all questions answered. ? ?DX: Right diabetic foot infection  ?Wound care: saline WTD daily dressing changes ?Antibiotics: Per primary  ?DME: post-op shoe  ?Discussed with patient diagnosis and treatment options.  ?  Discussed with patients findings from OR yesterday and spread of infection to media toes. Recommended that due to residual infection and look of the lateral toes his best option to give him a functional foot is to proceed with a transmetatarsal amputation. Patient understandably disappointed but  agrees to proceed. Will plan for Right transmetatarsal amputation Friday afternoon.  ?NPO after midnight tonight ?Patient in agreement with plan and all questions answered.  ? ?Lorenda Peck, MD ? ?Accessible via secure chat for questions or concerns. ? ?

## 2021-09-19 ENCOUNTER — Inpatient Hospital Stay (HOSPITAL_COMMUNITY): Payer: Medicare HMO | Admitting: Anesthesiology

## 2021-09-19 ENCOUNTER — Encounter (HOSPITAL_COMMUNITY): Payer: Medicare HMO

## 2021-09-19 ENCOUNTER — Inpatient Hospital Stay (HOSPITAL_COMMUNITY): Payer: Medicare HMO

## 2021-09-19 ENCOUNTER — Encounter: Payer: Medicare HMO | Admitting: Vascular Surgery

## 2021-09-19 ENCOUNTER — Other Ambulatory Visit: Payer: Self-pay

## 2021-09-19 ENCOUNTER — Encounter (HOSPITAL_COMMUNITY)
Admission: EM | Disposition: A | Discharge status: Skilled Nursing Facility | Payer: Self-pay | Source: Home / Self Care | Attending: Family Medicine

## 2021-09-19 ENCOUNTER — Encounter (HOSPITAL_COMMUNITY): Payer: Self-pay | Admitting: Family Medicine

## 2021-09-19 DIAGNOSIS — E1169 Type 2 diabetes mellitus with other specified complication: Secondary | ICD-10-CM

## 2021-09-19 DIAGNOSIS — Z7984 Long term (current) use of oral hypoglycemic drugs: Secondary | ICD-10-CM

## 2021-09-19 DIAGNOSIS — M869 Osteomyelitis, unspecified: Secondary | ICD-10-CM

## 2021-09-19 DIAGNOSIS — E11628 Type 2 diabetes mellitus with other skin complications: Secondary | ICD-10-CM | POA: Diagnosis not present

## 2021-09-19 DIAGNOSIS — L03115 Cellulitis of right lower limb: Secondary | ICD-10-CM | POA: Diagnosis not present

## 2021-09-19 DIAGNOSIS — L03119 Cellulitis of unspecified part of limb: Secondary | ICD-10-CM | POA: Diagnosis not present

## 2021-09-19 DIAGNOSIS — Z794 Long term (current) use of insulin: Secondary | ICD-10-CM

## 2021-09-19 HISTORY — PX: AMPUTATION: SHX166

## 2021-09-19 LAB — CBC WITH DIFFERENTIAL/PLATELET
Abs Immature Granulocytes: 0.68 10*3/uL — ABNORMAL HIGH (ref 0.00–0.07)
Basophils Absolute: 0.1 10*3/uL (ref 0.0–0.1)
Basophils Relative: 1 %
Eosinophils Absolute: 0.3 10*3/uL (ref 0.0–0.5)
Eosinophils Relative: 2 %
HCT: 32.5 % — ABNORMAL LOW (ref 39.0–52.0)
Hemoglobin: 11 g/dL — ABNORMAL LOW (ref 13.0–17.0)
Immature Granulocytes: 5 %
Lymphocytes Relative: 13 %
Lymphs Abs: 1.9 10*3/uL (ref 0.7–4.0)
MCH: 28.4 pg (ref 26.0–34.0)
MCHC: 33.8 g/dL (ref 30.0–36.0)
MCV: 83.8 fL (ref 80.0–100.0)
Monocytes Absolute: 1.3 10*3/uL — ABNORMAL HIGH (ref 0.1–1.0)
Monocytes Relative: 9 %
Neutro Abs: 10.1 10*3/uL — ABNORMAL HIGH (ref 1.7–7.7)
Neutrophils Relative %: 70 %
Platelets: 578 10*3/uL — ABNORMAL HIGH (ref 150–400)
RBC: 3.88 MIL/uL — ABNORMAL LOW (ref 4.22–5.81)
RDW: 14.6 % (ref 11.5–15.5)
WBC: 14.3 10*3/uL — ABNORMAL HIGH (ref 4.0–10.5)
nRBC: 0 % (ref 0.0–0.2)

## 2021-09-19 LAB — GLUCOSE, CAPILLARY
Glucose-Capillary: 145 mg/dL — ABNORMAL HIGH (ref 70–99)
Glucose-Capillary: 123 mg/dL — ABNORMAL HIGH (ref 70–99)
Glucose-Capillary: 137 mg/dL — ABNORMAL HIGH (ref 70–99)
Glucose-Capillary: 116 mg/dL — ABNORMAL HIGH (ref 70–99)
Glucose-Capillary: 109 mg/dL — ABNORMAL HIGH (ref 70–99)
Glucose-Capillary: 122 mg/dL — ABNORMAL HIGH (ref 70–99)
Glucose-Capillary: 186 mg/dL — ABNORMAL HIGH (ref 70–99)

## 2021-09-19 SURGERY — AMPUTATION, FOOT, PARTIAL
Anesthesia: Monitor Anesthesia Care | Site: Foot | Laterality: Right

## 2021-09-19 MED ORDER — PHENYLEPHRINE 40 MCG/ML (10ML) SYRINGE FOR IV PUSH (FOR BLOOD PRESSURE SUPPORT)
PREFILLED_SYRINGE | INTRAVENOUS | Status: DC | PRN
Start: 2021-09-19 — End: 2021-09-19
  Administered 2021-09-19 (×3): 80 ug via INTRAVENOUS

## 2021-09-19 MED ORDER — ONDANSETRON HCL 4 MG/2ML IJ SOLN
INTRAMUSCULAR | Status: AC
  Filled 2021-09-19: qty 2

## 2021-09-19 MED ORDER — MIDAZOLAM HCL 2 MG/2ML IJ SOLN: 0.5000 mg | Freq: Once | INTRAMUSCULAR | Status: DC | PRN

## 2021-09-19 MED ORDER — LIDOCAINE 2% (20 MG/ML) 5 ML SYRINGE
INTRAMUSCULAR | Status: AC
  Filled 2021-09-19: qty 5

## 2021-09-19 MED ORDER — LIDOCAINE 2% (20 MG/ML) 5 ML SYRINGE
INTRAMUSCULAR | Status: DC | PRN
  Administered 2021-09-19: 50 mg via INTRAVENOUS

## 2021-09-19 MED ORDER — CHLORHEXIDINE GLUCONATE CLOTH 2 % EX PADS: 6.0000 | MEDICATED_PAD | Freq: Once | CUTANEOUS | Status: DC

## 2021-09-19 MED ORDER — PROPOFOL 500 MG/50ML IV EMUL
INTRAVENOUS | Status: DC | PRN
Start: 2021-09-19 — End: 2021-09-19
  Administered 2021-09-19: 50 ug/kg/min via INTRAVENOUS

## 2021-09-19 MED ORDER — PHENYLEPHRINE 40 MCG/ML (10ML) SYRINGE FOR IV PUSH (FOR BLOOD PRESSURE SUPPORT)
PREFILLED_SYRINGE | INTRAVENOUS | Status: AC
  Filled 2021-09-19: qty 10

## 2021-09-19 MED ORDER — ORAL CARE MOUTH RINSE: 15.0000 mL | Freq: Once | OROMUCOSAL | Status: AC

## 2021-09-19 MED ORDER — FENTANYL CITRATE (PF) 250 MCG/5ML IJ SOLN
INTRAMUSCULAR | Status: DC | PRN
  Administered 2021-09-19: 25 ug via INTRAVENOUS

## 2021-09-19 MED ORDER — LIDOCAINE HCL 2 % IJ SOLN
INTRAMUSCULAR | Status: DC | PRN
  Administered 2021-09-19: 10 mL

## 2021-09-19 MED ORDER — LACTATED RINGERS IV SOLN: INTRAVENOUS | Status: DC

## 2021-09-19 MED ORDER — INSULIN ASPART 100 UNIT/ML IJ SOLN: 0.0000 [IU] | INTRAMUSCULAR | Status: DC | PRN

## 2021-09-19 MED ORDER — BUPIVACAINE HCL (PF) 0.25 % IJ SOLN
INTRAMUSCULAR | Status: AC
  Filled 2021-09-19: qty 30

## 2021-09-19 MED ORDER — BUPIVACAINE HCL (PF) 0.25 % IJ SOLN
INTRAMUSCULAR | Status: DC | PRN
  Administered 2021-09-19: 10 mL

## 2021-09-19 MED ORDER — FENTANYL CITRATE (PF) 250 MCG/5ML IJ SOLN
INTRAMUSCULAR | Status: AC
  Filled 2021-09-19: qty 5

## 2021-09-19 MED ORDER — CHLORHEXIDINE GLUCONATE 0.12 % MT SOLN
OROMUCOSAL | Status: AC
  Administered 2021-09-19: 15 mL via OROMUCOSAL
  Filled 2021-09-19: qty 15

## 2021-09-19 MED ORDER — HYDROMORPHONE HCL 1 MG/ML IJ SOLN: 0.2500 mg | INTRAMUSCULAR | Status: DC | PRN

## 2021-09-19 MED ORDER — CHLORHEXIDINE GLUCONATE 0.12 % MT SOLN: 15.0000 mL | Freq: Once | OROMUCOSAL | Status: AC

## 2021-09-19 MED ORDER — ONDANSETRON HCL 4 MG/2ML IJ SOLN
INTRAMUSCULAR | Status: DC | PRN
  Administered 2021-09-19: 4 mg via INTRAVENOUS

## 2021-09-19 MED ORDER — MEPERIDINE HCL 25 MG/ML IJ SOLN: 6.2500 mg | INTRAMUSCULAR | Status: DC | PRN

## 2021-09-19 MED ORDER — LIDOCAINE HCL 2 % IJ SOLN
INTRAMUSCULAR | Status: AC
  Filled 2021-09-19: qty 20

## 2021-09-19 MED ORDER — CHLORHEXIDINE GLUCONATE CLOTH 2 % EX PADS
6.0000 | MEDICATED_PAD | Freq: Once | CUTANEOUS | Status: AC
  Administered 2021-09-19: 6 via TOPICAL

## 2021-09-19 MED ORDER — PROPOFOL 10 MG/ML IV BOLUS
INTRAVENOUS | Status: DC | PRN
  Administered 2021-09-19: 60 mg via INTRAVENOUS

## 2021-09-19 SURGICAL SUPPLY — 49 items
BAG COUNTER SPONGE SURGICOUNT (BAG) ×2 IMPLANT
BAG SPNG CNTER NS LX DISP (BAG) ×1
BLADE AVERAGE 25X9 (BLADE) ×2 IMPLANT
BNDG CMPR 9X4 STRL LF SNTH (GAUZE/BANDAGES/DRESSINGS) ×1
BNDG CMPR STD VLCR NS LF 5.8X3 (GAUZE/BANDAGES/DRESSINGS) ×1
BNDG CONFORM 2 STRL LF (GAUZE/BANDAGES/DRESSINGS) ×2 IMPLANT
BNDG ELASTIC 3X5.8 VLCR NS LF (GAUZE/BANDAGES/DRESSINGS) ×1 IMPLANT
BNDG ELASTIC 3X5.8 VLCR STR LF (GAUZE/BANDAGES/DRESSINGS) ×2 IMPLANT
BNDG ESMARK 4X9 LF (GAUZE/BANDAGES/DRESSINGS) ×2 IMPLANT
BNDG GAUZE ELAST 4 BULKY (GAUZE/BANDAGES/DRESSINGS) ×1 IMPLANT
CNTNR URN SCR LID CUP LEK RST (MISCELLANEOUS) IMPLANT
CONT SPEC 4OZ STRL OR WHT (MISCELLANEOUS) ×2
COVER SURGICAL LIGHT HANDLE (MISCELLANEOUS) ×1 IMPLANT
CUFF TOURN SGL QUICK 24 (TOURNIQUET CUFF)
CUFF TOURN SGL QUICK 34 (TOURNIQUET CUFF)
CUFF TRNQT CYL 24X4X16.5-23 (TOURNIQUET CUFF) IMPLANT
CUFF TRNQT CYL 34X4.125X (TOURNIQUET CUFF) IMPLANT
DRSG EMULSION OIL 3X3 NADH (GAUZE/BANDAGES/DRESSINGS) ×1 IMPLANT
DRSG PAD ABDOMINAL 8X10 ST (GAUZE/BANDAGES/DRESSINGS) ×1 IMPLANT
DURAPREP 26ML APPLICATOR (WOUND CARE) ×2 IMPLANT
ELECT REM PT RETURN 9FT ADLT (ELECTROSURGICAL) ×2
ELECTRODE REM PT RTRN 9FT ADLT (ELECTROSURGICAL) ×1 IMPLANT
GAUZE SPONGE 4X4 12PLY STRL (GAUZE/BANDAGES/DRESSINGS) ×2 IMPLANT
GLOVE SURG ENC MOIS LTX SZ8 (GLOVE) ×2 IMPLANT
GOWN STRL REUS W/ TWL LRG LVL3 (GOWN DISPOSABLE) ×1 IMPLANT
GOWN STRL REUS W/ TWL XL LVL3 (GOWN DISPOSABLE) ×1 IMPLANT
GOWN STRL REUS W/TWL LRG LVL3 (GOWN DISPOSABLE) ×2
GOWN STRL REUS W/TWL XL LVL3 (GOWN DISPOSABLE) ×2
HANDPIECE INTERPULSE COAX TIP (DISPOSABLE) ×4
KIT BASIN OR (CUSTOM PROCEDURE TRAY) ×2 IMPLANT
KIT TURNOVER KIT B (KITS) ×2 IMPLANT
NDL 18GX1X1/2 (RX/OR ONLY) (NEEDLE) ×1 IMPLANT
NDL HYPO 25X1 1.5 SAFETY (NEEDLE) ×2 IMPLANT
NEEDLE 18GX1X1/2 (RX/OR ONLY) (NEEDLE) ×2 IMPLANT
NEEDLE HYPO 25X1 1.5 SAFETY (NEEDLE) ×4 IMPLANT
NS IRRIG 1000ML POUR BTL (IV SOLUTION) ×2 IMPLANT
PACK ORTHO EXTREMITY (CUSTOM PROCEDURE TRAY) ×2 IMPLANT
PAD ARMBOARD 7.5X6 YLW CONV (MISCELLANEOUS) ×4 IMPLANT
SET HNDPC FAN SPRY TIP SCT (DISPOSABLE) ×1 IMPLANT
SPONGE T-LAP 18X18 ~~LOC~~+RFID (SPONGE) ×1 IMPLANT
SUT ETHILON 3 0 PS 1 (SUTURE) ×4 IMPLANT
SUT VIC AB 3-0 FS2 27 (SUTURE) ×2 IMPLANT
SUT VICRYL 4-0 PS2 18IN ABS (SUTURE) IMPLANT
SWAB COLLECTION DEVICE MRSA (MISCELLANEOUS) ×1 IMPLANT
SWAB CULTURE ESWAB REG 1ML (MISCELLANEOUS) ×2 IMPLANT
SYR CONTROL 10ML LL (SYRINGE) ×3 IMPLANT
TUBE CONNECTING 12X1/4 (SUCTIONS) ×1 IMPLANT
WATER STERILE IRR 1000ML POUR (IV SOLUTION) ×2 IMPLANT
YANKAUER SUCT BULB TIP NO VENT (SUCTIONS) ×1 IMPLANT

## 2021-09-19 NOTE — TOC Progression Note (Signed)
Transition of Care (TOC) - Progression Note  ? ? ?Patient Details  ?Name: [name] ?MRN: [id number] ?Date of Birth: 01-Jun-1956 ? ?Transition of Care (TOC) CM/SW Contact  ?Vinie Sill, LCSW ?Phone Number: ?09/19/2021, 2:26 PM ? ?Clinical Narrative:    ? ?CSW spoke with patient's significant other,[name]- CSW provided bed offers. She chose [facility].  ? ?[facility], they have confirmed bed offer.  ? ?TOC will continue to follow and assist with discharge planning. ? ?Thurmond Butts, MSW, LCSW ?Clinical Social Worker ? ? ? ?Expected Discharge Plan: [place] ?Barriers to Discharge: Ship broker, Continued Medical Work up, SNF Pending bed offer ? ?Expected Discharge Plan and Services ?Expected Discharge Plan: [name] ?In-house Referral: Clinical Social Work ?  ?  ?  ?                ?  ?  ?  ?  ?  ?  ?  ?  ?  ?  ? ? ?Social Determinants of Health (SDOH) Interventions ?  ? ?Readmission Risk Interventions ?No flowsheet data found. ? ?

## 2021-09-19 NOTE — Transfer of Care (Signed)
Immediate Anesthesia Transfer of Care Note ? ?Patient: [name] ? ?Procedure(s) Performed: TRANSMETATARSAL AMPUTATION FOOT (Right: Foot) ? ?Patient Location: PACU ? ?Anesthesia Type:MAC ? ?Level of Consciousness: awake and alert  ? ?Airway & Oxygen Therapy: Patient Spontanous Breathing ? ?Post-op Assessment: Report given to RN and Post -op Vital signs reviewed and stable ? ?Post vital signs: Reviewed and stable ? ?Last Vitals:  ?Vitals Value Taken Time  ?BP 108/66 09/19/21 1519  ?Temp 98.8   ?Pulse 71   ?Resp 13 09/19/21 1520  ?SpO2 100   ?Vitals shown include unvalidated device data. ? ?Last Pain:  ?Vitals:  ? 09/19/21 1125  ?TempSrc: Oral  ?PainSc:   ?   ? ?Patients Stated Pain Goal: 2 (09/15/21 1012) ? ?Complications: No notable events documented. ?

## 2021-09-19 NOTE — Care Management Important Message (Signed)
Important Message ? ?Patient Details  ?Name: [name] ?MRN: [id number] ?Date of Birth: [birth year] ? ? ?Medicare Important Message Given:  Yes ? ? ? ? ?Shelda Altes ?09/19/2021, 9:17 AM ?

## 2021-09-19 NOTE — Progress Notes (Signed)
Reinforced dressing to RLL per Md verbal order. ?

## 2021-09-19 NOTE — Progress Notes (Signed)
?PROGRESS NOTE ? ? ? [name]  CHY:[id number] DOB: 10-Feb-1956 DOA: 09/12/2021 ?PCP: Harvie Junior, MD  ? ? ?Brief Narrative:  ?66 year old gentleman with history of type 2 diabetes on insulin, essential hypertension, peripheral neuropathy and obstructive sleep apnea presented with right foot redness progressed to blistering on the dorsum of the right foot and associated spreading inflammation rapidly progressing within 24 hours.  Patient does have history of nonhealing ulcer on the sole of the right foot.  Patient was seen by vascular surgery as outpatient and underwent right leg angiography and angioplasty by Dr. Unk Lightning on 3/8 for same.  In the emergency room patient with evidence of acute kidney injury, leukocytosis with WBC count 21.8 and thrombocytosis.  Blood cultures drawn.  Started on broad-spectrum antibiotics and admitted to the hospital. ?Patient was already scheduled to have surgery/ Bypass ? next week. ? ? ?Assessment & Plan: ?  ?Cellulitis and abscess of the right foot, diabetic foot infection, sepsis present on admission with leukocytosis, tachycardia and acute renal failure: ?Patient was started on IV vancomycin and cefepime for severe nonpurulent cellulitis.  Blood cultures negative so far.  Seen by vascular surgery.  Underwent right leg below-knee popliteal artery to posterior tibial artery bypass using nonreversed greater saphenous vein and fourth metatarsal ray amputation, left open on 09/15/2021.  Also underwent irrigation and debridement of the right foot by podiatry on 09/17/2021.  Now they are planning to do transmetatarsal amputation today.  Patient is agreeable.  Continue antibiotics.  ? ?Acute kidney injury: Recent known normal renal function 1.08-1.2.  Creatinine 2.14 on  presentation. Consistent with ATN from sepsis.  Resolved and renal function back to normal now. ? ?Essential hypertension: Blood pressure fairly controlled we will continue reduced dose of lisinopril for  now. ? ?Type 2 diabetes, uncontrolled with hyperglycemia.  Blood sugar controlled.  Continue Semglee 10 units and SSI. ? ?Obstructive sleep apnea: Not using CPAP at home as he could not tolerate that. ? ?GERD: On PPI.  Continue. ? ? ?DVT prophylaxis: SCD's Start: 09/19/21 0755 ?SCD's Start: 09/15/21 1734 ?enoxaparin (LOVENOX) injection 40 mg Start: 09/13/21 1000 ? ? ?Code Status: Full code ?Family Communication: None at the bedside. ?Disposition Plan: Status is: Inpatient ?Remains inpatient appropriate because: Pending procedure tomorrow ? ?Consultants:  ?Vascular surgery ? ?Procedures:  ?As above ? ?Antimicrobials:  ?Vancomycin and cefepime 3/10---- ? ? ?Subjective: ? ?Seen and examined.  No complaints other than right foot pain. ? ?Objective: ?Vitals:  ? 09/18/21 2044 09/18/21 2315 09/19/21 0403 09/19/21 0820  ?BP: 140/75 (!) 142/67 (!) 124/109 (!) 144/80  ?Pulse: 82 71 73 79  ?Resp: '17 10 11 14  '$ ?Temp: 98.7 ?F (37.1 ?C) 98.5 ?F (36.9 ?C) 98.9 ?F (37.2 ?C) (!) 100.4 ?F (38 ?C)  ?TempSrc: Oral Oral Oral Oral  ?SpO2: 97% 94% 97% 100%  ?Weight:      ?Height:      ? ? ?Intake/Output Summary (Last 24 hours) at 09/19/2021 1057 ?Last data filed at 09/19/2021 8786 ?Gross per 24 hour  ?Intake 401.92 ml  ?Output 2300 ml  ?Net -1898.08 ml  ? ? ?Filed Weights  ? 09/12/21 2256 09/14/21 0442 09/14/21 2018  ?Weight: 106.1 kg 102.6 kg 102 kg  ? ? ?Examination: ? ?General exam: Appears calm and comfortable  ?Respiratory system: Clear to auscultation. Respiratory effort normal. ?Cardiovascular system: S1 & S2 heard, RRR. No JVD, murmurs, rubs, gallops or clicks. No pedal edema. ?Gastrointestinal system: Abdomen is nondistended, soft and nontender. No organomegaly or masses  felt. Normal bowel sounds heard. ?Central nervous system: Alert and oriented. No focal neurological deficits. ?Extremities: Dressing in the right foot. ?Psychiatry: Judgement and insight appear normal. Mood & affect appropriate.  ? ? ?Data Reviewed: I have  personally reviewed following labs and imaging studies ? ?CBC: ?Recent Labs  ?Lab 09/15/21 ?5784 09/16/21 ?0140 09/17/21 ?6962 09/18/21 ?0152 09/19/21 ?0150  ?WBC 24.0* 20.5* 16.3* 14.8* 14.3*  ?NEUTROABS 17.9* 15.6* 12.2* 10.7* 10.1*  ?HGB 12.5* 11.3* 11.4* 11.0* 11.0*  ?HCT 38.0* 35.4* 33.9* 34.0* 32.5*  ?MCV 84.3 87.0 85.0 85.0 83.8  ?PLT 525* 589* 557* 596* 578*  ? ? ?Basic Metabolic Panel: ?Recent Labs  ?Lab 09/13/21 ?0303 09/14/21 ?9528 09/15/21 ?4132 09/16/21 ?0140 09/18/21 ?0152  ?NA 132* 133* 136 136 132*  ?K 3.8 3.8 4.1 4.6 3.8  ?CL 97* 102 106 108 105  ?CO2 21* 20* 18* 17* 19*  ?GLUCOSE 200* 134* 115* 104* 178*  ?BUN 27* '19 14 16 13  '$ ?CREATININE 2.14* 1.32* 1.18 1.08 0.77  ?CALCIUM 8.4* 8.1* 8.3* 8.0* 7.8*  ?MG  --  2.0  --   --   --   ?PHOS  --  2.3*  --   --   --   ? ? ?GFR: ?Estimated Creatinine Clearance: 109.3 mL/min (by C-G formula based on SCr of 0.77 mg/dL). ?Liver Function Tests: ?Recent Labs  ?Lab 09/12/21 ?2309  ?AST 27  ?ALT 32  ?ALKPHOS 83  ?BILITOT 0.6  ?PROT 8.0  ?ALBUMIN 3.0*  ? ? ?No results for input(s): LIPASE, AMYLASE in the last 168 hours. ?No results for input(s): AMMONIA in the last 168 hours. ?Coagulation Profile: ?No results for input(s): INR, PROTIME in the last 168 hours. ?Cardiac Enzymes: ?No results for input(s): CKTOTAL, CKMB, CKMBINDEX, TROPONINI in the last 168 hours. ?BNP (last 3 results) ?No results for input(s): PROBNP in the last 8760 hours. ?HbA1C: ?No results for input(s): HGBA1C in the last 72 hours. ? ?CBG: ?Recent Labs  ?Lab 09/18/21 ?1145 09/18/21 ?1728 09/18/21 ?2122 09/19/21 ?0617 09/19/21 ?1053  ?GLUCAP 137* 170* 158* 145* 123*  ? ? ?Lipid Profile: ?No results for input(s): CHOL, HDL, LDLCALC, TRIG, CHOLHDL, LDLDIRECT in the last 72 hours. ? ?Thyroid Function Tests: ?No results for input(s): TSH, T4TOTAL, FREET4, T3FREE, THYROIDAB in the last 72 hours. ?Anemia Panel: ?No results for input(s): VITAMINB12, FOLATE, FERRITIN, TIBC, IRON, RETICCTPCT in the last 72  hours. ?Sepsis Labs: ?Recent Labs  ?Lab 09/13/21 ?4401 09/13/21 ?0210  ?LATICACIDVEN 1.8 1.4  ? ? ? ?Recent Results (from the past 240 hour(s))  ?Resp Panel by RT-PCR (Flu A&B, Covid)     Status: None  ? Collection Time: 09/13/21 12:29 AM  ? Specimen: Nasopharyngeal(NP) swabs in vial transport medium  ?Result Value Ref Range Status  ? SARS Coronavirus 2 by RT PCR NEGATIVE NEGATIVE Final  ?  Comment: (NOTE) ?SARS-CoV-2 target nucleic acids are NOT DETECTED. ? ?The SARS-CoV-2 RNA is generally detectable in upper respiratory ?specimens during the acute phase of infection. The lowest ?concentration of SARS-CoV-2 viral copies this assay can detect is ?138 copies/mL. A negative result does not preclude SARS-Cov-2 ?infection and should not be used as the sole basis for treatment or ?other patient management decisions. A negative result may occur with  ?improper specimen collection/handling, submission of specimen other ?than nasopharyngeal swab, presence of viral mutation(s) within the ?areas targeted by this assay, and inadequate number of viral ?copies(<138 copies/mL). A negative result must be combined with ?clinical observations, patient history, and epidemiological ?information. The  expected result is Negative. ? ?Fact Sheet for Patients:  ?EntrepreneurPulse.com.au ? ?Fact Sheet for Healthcare Providers:  ?IncredibleEmployment.be ? ?This test is no t yet approved or cleared by the [place] FDA and  ?has been authorized for detection and/or diagnosis of SARS-CoV-2 by ?FDA under an Emergency Use Authorization (EUA). This EUA will remain  ?in effect (meaning this test can be used) for the duration of the ?COVID-19 declaration under Section 564(b)(1) of the Act, 21 ?U.S.C.section 360bbb-3(b)(1), unless the authorization is terminated  ?or revoked sooner.  ? ? ?  ? Influenza A by PCR NEGATIVE NEGATIVE Final  ? Influenza B by PCR NEGATIVE NEGATIVE Final  ?  Comment: (NOTE) ?The Xpert  Xpress SARS-CoV-2/FLU/RSV plus assay is intended as an aid ?in the diagnosis of influenza from Nasopharyngeal swab specimens and ?should not be used as a sole basis for treatment. Nasal washings and ?aspirates are u

## 2021-09-19 NOTE — Interval H&P Note (Signed)
History and Physical Interval Note: ? ?09/19/2021 ?2:15 PM ? ?[name]  has presented today for surgery, with the diagnosis of Right foot diabetic infection.  The various methods of treatment have been discussed with the patient and family. After consideration of risks, benefits and other options for treatment, the patient has consented to  Procedure(s) with comments: ?TRANSMETATARSAL AMPUTATION FOOT (Right) - Surgical team to do local block as a surgical intervention.  The patient's history has been reviewed, patient examined, no change in status, stable for surgery.  I have reviewed the patient's chart and labs.  Questions were answered to the patient's satisfaction.   ? ? ?Lorenda Peck ? ? ?

## 2021-09-19 NOTE — Op Note (Addendum)
DATE: 09/19/21 ? ? ?SURGEON: Lorenda Peck, DPM ? ?PREOPERATIVE DIAGNOSIS:Right foot diabetic infection  ? ? ? ? ?POSTOPERATIVE DIAGNOSIS: Same ? ? ? ? ?PROCEDURE PERFORMED:right Transmetatarsal Amputation  ? ? ? ?HEMOSTASIS:  n/a ? ? ? ? ?ESTIMATED BLOOD LOSS:  Minimal ? ? ? ? ?ANESTHESIA:  General with local 20cc of 1:1 mixture 1% lidocaine and 0.5% marcaine plain ? ? ? ? ?SPECIMENS: Bone culture and pathology  ? ? ?COMPLICATIONS:  None. ? ? ? ? ?INDICATIONS FOR PROCEDURE:  This patient is a pleasant 66 y.o. male who has been hospitalized for infected amputation site and osteomyelitis.   Patient opts for revisional amputation. Risks and complications include but are not limited to infection, recurrence of symptoms, pain, numbness, wound dehiscence, delayed healing, as well as need for future surgery/further amputation.  No guarantees were given or applied.  All questions were answered to the patient?s satisfaction, and the patient has consented to the above procedure.  All preoperative labs and H&P, medical clearances have been obtained and NPO status has been confirmed. ? ?PROCEDURE IN DETAIL: ?After suitable general anesthesia, the patient's right lower extremity, with a tourniquet cuff in place, was prepped and draped in the usual sterile fashion. Attention was directed to the operative extremity with visible site marking. Incision was planned with skin marker. Dorsal incision was then made with a 15 blade through skin and subcutaneous tissue down to bone. Electrocautery was used to ligate any bleeders. Incision was then made along the plantar aspect of the foot around the previous wound areas and careful to ligate any bleeders with electrocautery. Next, the bony cuts were made using sagittal saw, and these were made approximately at the mid level of the metatarsals and in a cascade fashion, transecting all of the 5 metatarsal bones. Next, the amputated forefoot was then removed. Following this, the wound was  thoroughly irrigated and then clean area of bone from fourth metatarsal was biopsied for culture. Hemostasis was then obtained After full hemostasis had been obtained, the wound was again thoroughly irrigated and then the dorsal plantar flap was further tailored so that the plantar flap was lying in a dorsal direction on the medial aspect. The skin  was closed with interrupted sutures using 3-0 Nylon medially and plantarly. Unfortunatley did not have enough viable flap to cover dorsal wound. Wound measured about 6 cm x 5 cm.  This area was dressed with saline soaked gauze .Adaptic and a bulky foot dressing were applied, and the patient awakened and transferred to the recovery room in a stable condition. ? ?DISPO: ?Patient to return to medical floor. Continue IV Abx. Will need daily dressing changes WTD on dorsal foot wound.  Podiatry will continue to follow. Will likely need home health for dressing changes upon discharge.  ? ?Lorenda Peck, MD  ? ? ?

## 2021-09-19 NOTE — Anesthesia Preprocedure Evaluation (Addendum)
Anesthesia Evaluation  ?Patient identified by MRN, date of birth, ID band ?Patient awake ? ? ? ?Reviewed: ?Allergy & Precautions, NPO status , Patient's Chart, lab work & pertinent test results, reviewed documented beta blocker date and time  ? ?History of Anesthesia Complications ?Negative for: history of anesthetic complications ? ?Airway ?Mallampati: I ? ?TM Distance: >3 FB ?Neck ROM: Full ? ? ? Dental ? ?(+) Edentulous Upper, Edentulous Lower ?  ?Pulmonary ?sleep apnea (does not use his CPAP or home O2) , former smoker,  ?  ?breath sounds clear to auscultation ? ? ? ? ? ? Cardiovascular ?hypertension, Pt. on medications and Pt. on home beta blockers ?(-) angina ?Rhythm:Regular Rate:Normal ? ? ?  ?Neuro/Psych ?PSYCHIATRIC DISORDERS (PTSD) Anxiety Depression Peripheral neuropathy ?  ? GI/Hepatic ?Neg liver ROS, GERD  Controlled,  ?Endo/Other  ?diabetes (glu 116), Insulin Dependent, Oral Hypoglycemic Agents ? Renal/GU ?Renal InsufficiencyRenal disease  ? ?  ?Musculoskeletal ? ? Abdominal ?  ?Peds ? Hematology ?negative hematology ROS ?(+) plavix   ?Anesthesia Other Findings ? ? Reproductive/Obstetrics ? ?  ? ? ? ? ? ? ? ? ? ? ? ? ? ?  ?  ? ? ? ? ? ? ? ?Anesthesia Physical ?Anesthesia Plan ? ?ASA: 3 ? ?Anesthesia Plan: MAC  ? ?Post-op Pain Management: Tylenol PO (pre-op)*  ? ?Induction:  ? ?PONV Risk Score and Plan: 1 and Ondansetron and Treatment may vary due to age or medical condition ? ?Airway Management Planned: Natural Airway and Simple Face Mask ? ?Additional Equipment: None ? ?Intra-op Plan:  ? ?Post-operative Plan:  ? ?Informed Consent: I have reviewed the patients History and Physical, chart, labs and discussed the procedure including the risks, benefits and alternatives for the proposed anesthesia with the patient or authorized representative who has indicated his/her understanding and acceptance.  ? ? ? ? ? ?Plan Discussed with: CRNA and Surgeon ? ?Anesthesia Plan  Comments:   ? ? ? ? ? ? ?Anesthesia Quick Evaluation ? ?

## 2021-09-19 NOTE — Progress Notes (Signed)
Patient dressing after procedure was found to be bleeding thru all dressings. Md Blenda Mounts made aware and gave verbal order to place ABD, Kerlex and ace wrap on top of current dressing. ?

## 2021-09-19 NOTE — Brief Op Note (Signed)
09/12/2021 - 09/19/2021 ? ?2:16 PM ? ?PATIENT:  [name]  66 y.o. male ? ?PRE-OPERATIVE DIAGNOSIS:  Right foot diabetic infection ? ?POST-OPERATIVE DIAGNOSIS:  * No post-op diagnosis entered * ? ?PROCEDURE:  Procedure(s) with comments: ?TRANSMETATARSAL AMPUTATION FOOT (Right) - Surgical team to do local block ? ?SURGEON:  Surgeon(s) and Role: ?   Lorenda Peck, MD - Primary ? ?PHYSICIAN ASSISTANT:  ? ?ASSISTANTS: none  ? ?ANESTHESIA:   local and MAC ? ?EBL: <50 ml  ? ?BLOOD ADMINISTERED:none ? ?DRAINS: none  ? ?LOCAL MEDICATIONS USED:  MARCAINE   , LIDOCAINE , and Amount: 20 ? ml ? ?SPECIMEN:  Source of Specimen:  right foot  ? ?DISPOSITION OF SPECIMEN:   pathology and culture  ? ?COUNTS:  YES ? ?TOURNIQUET:  * Missing tourniquet times found for documented tourniquets in log: [id number] * ? ?DICTATION: .Note written in EPIC ? ?PLAN OF CARE: Admit to inpatient  ? ?PATIENT DISPOSITION:  PACU - hemodynamically stable. ?  ?Delay start of Pharmacological VTE agent (>24hrs) due to surgical blood loss or risk of bleeding: yes ? ?

## 2021-09-19 NOTE — Progress Notes (Addendum)
[facility] ? ?Subjective  - No new issues ? ? ?Objective ?(!) 124/109 ?73 ?98.9 ?F (37.2 ?C) (Oral) ?11 ?97% ? ?Intake/Output Summary (Last 24 hours) at 09/19/2021 0753 ?Last data filed at 09/19/2021 0545 ?Gross per 24 hour  ?Intake 401.92 ml  ?Output 1900 ml  ?Net -1498.08 ml  ? ? ?Right LE with mod edema ?Incisions healing well PT doppler signal brisk ?Dressing clean and dry right foot ?Lungs non labored breathing ? ?Assessment/Planning: ?POD # 2  ?66 y.o. male is s/p:  ?Right leg below-knee popliteal artery to posterior tibial artery bypass using nonreversed greater saphenous vein and fourth metatarsal ray amputation, left open ? ?Plan for OR today with DPM for toe amputation ?On IV antibiotics Cefepime ?Lovenox for DVT prophylaxis ? ? ?Roxy Horseman ?09/19/2021 ?7:53 AM ?-- ? ?VASCULAR STAFF ADDENDUM: ?I have independently interviewed and examined the patient. ?I agree with the above.  ?Appreciate hospital medicine and podiatry involvement.  ? ?J. Melene Muller, MD ?[facility] ?Office Phone Number: [phone] ?09/19/2021 8:06 AM ? ? ? ?Laboratory ?Lab Results: ?Recent Labs  ?  09/18/21 ?8280 09/19/21 ?0150  ?WBC 14.8* 14.3*  ?HGB 11.0* 11.0*  ?HCT 34.0* 32.5*  ?PLT 596* 578*  ? ?BMET ?Recent Labs  ?  09/18/21 ?0152  ?NA 132*  ?K 3.8  ?CL 105  ?CO2 19*  ?GLUCOSE 178*  ?BUN 13  ?CREATININE 0.77  ?CALCIUM 7.8*  ? ? ?COAG ?No results found for: INR, PROTIME ?No results found for: PTT ? ? ? ?

## 2021-09-19 NOTE — Anesthesia Procedure Notes (Signed)
Procedure Name: [place] ?Date/Time: 09/19/2021 2:26 PM ?Performed by: Lorie Phenix, CRNA ?Pre-anesthesia Checklist: Emergency Drugs available, Patient identified, Suction available and Patient being monitored ?Patient Re-evaluated:Patient Re-evaluated prior to induction ?Oxygen Delivery Method: Simple face mask ?Induction Type: IV induction ?Placement Confirmation: positive ETCO2 ? ? ? ? ?

## 2021-09-19 NOTE — Progress Notes (Signed)
PT Cancellation Note ? ?Patient Details ?Name: [name] ?MRN: [id number] ?DOB: [birth year] ? ? ?Cancelled Treatment:    Reason Eval/Treat Not Completed: Patient at procedure or test/unavailable. Pt in OR. ? ? ?Shary Decamp [facility] ?09/19/2021, 2:27 PM ?Suanne Marker PT ?Acute Rehabilitation Services ?Pager [phone] ?Office [phone] ? ? ? ?

## 2021-09-19 NOTE — Anesthesia Postprocedure Evaluation (Signed)
Anesthesia Post Note ? ?Patient: [name] ? ?Procedure(s) Performed: TRANSMETATARSAL AMPUTATION FOOT (Right: Foot) ? ?  ? ?Patient location during evaluation: PACU ?Anesthesia Type: MAC ?Level of consciousness: awake and alert, oriented and patient cooperative ?Pain management: pain level controlled ?Vital Signs Assessment: post-procedure vital signs reviewed and stable ?Respiratory status: spontaneous breathing, nonlabored ventilation and respiratory function stable ?Cardiovascular status: blood pressure returned to baseline and stable ?Postop Assessment: no apparent nausea or vomiting ?Anesthetic complications: no ? ? ?No notable events documented. ? ?Last Vitals:  ?Vitals:  ? 09/19/21 1550 09/19/21 1602  ?BP: (!) 123/57 124/64  ?Pulse: 68 66  ?Resp: 15 13  ?Temp:  37.1 ?C  ?SpO2: 96% 100%  ?  ?Last Pain:  ?Vitals:  ? 09/19/21 1602  ?TempSrc:   ?PainSc: 0-No pain  ? ? ?  ?  ?  ?  ?  ?  ? ?Prerna Harold,E. Deuce Paternoster ? ? ? ? ?

## 2021-09-20 ENCOUNTER — Encounter (HOSPITAL_COMMUNITY): Payer: Self-pay | Admitting: Podiatry

## 2021-09-20 DIAGNOSIS — L03119 Cellulitis of unspecified part of limb: Secondary | ICD-10-CM | POA: Diagnosis not present

## 2021-09-20 DIAGNOSIS — E11628 Type 2 diabetes mellitus with other skin complications: Secondary | ICD-10-CM | POA: Diagnosis not present

## 2021-09-20 LAB — GLUCOSE, CAPILLARY
Glucose-Capillary: 153 mg/dL — ABNORMAL HIGH (ref 70–99)
Glucose-Capillary: 208 mg/dL — ABNORMAL HIGH (ref 70–99)
Glucose-Capillary: 225 mg/dL — ABNORMAL HIGH (ref 70–99)
Glucose-Capillary: 191 mg/dL — ABNORMAL HIGH (ref 70–99)

## 2021-09-20 LAB — CBC WITH DIFFERENTIAL/PLATELET
Abs Immature Granulocytes: 0.65 10*3/uL — ABNORMAL HIGH (ref 0.00–0.07)
Basophils Absolute: 0.1 10*3/uL (ref 0.0–0.1)
Basophils Relative: 0 %
Eosinophils Absolute: 0.2 10*3/uL (ref 0.0–0.5)
Eosinophils Relative: 2 %
HCT: 28.1 % — ABNORMAL LOW (ref 39.0–52.0)
Hemoglobin: 9.6 g/dL — ABNORMAL LOW (ref 13.0–17.0)
Immature Granulocytes: 4 %
Lymphocytes Relative: 14 %
Lymphs Abs: 2.1 10*3/uL (ref 0.7–4.0)
MCH: 28.7 pg (ref 26.0–34.0)
MCHC: 34.2 g/dL (ref 30.0–36.0)
MCV: 83.9 fL (ref 80.0–100.0)
Monocytes Absolute: 1.5 10*3/uL — ABNORMAL HIGH (ref 0.1–1.0)
Monocytes Relative: 10 %
Neutro Abs: 10.3 10*3/uL — ABNORMAL HIGH (ref 1.7–7.7)
Neutrophils Relative %: 70 %
Platelets: 617 10*3/uL — ABNORMAL HIGH (ref 150–400)
RBC: 3.35 MIL/uL — ABNORMAL LOW (ref 4.22–5.81)
RDW: 14.6 % (ref 11.5–15.5)
WBC: 14.8 10*3/uL — ABNORMAL HIGH (ref 4.0–10.5)
nRBC: 0 % (ref 0.0–0.2)

## 2021-09-20 NOTE — Evaluation (Signed)
Physical Therapy Re-Evaluation ?Patient Details ?Name: [name] ?MRN: [id number] ?DOB: 1955-11-15 ?Today's Date: 09/20/2021 ? ?History of Present Illness ? 66 y/o male who presents on 09/13/21 due to right foot infection. 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 S/p R foot irrigation and debridement on 3/15. 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 PMH includes DM, HTN, sleep apnea. ?  ?Clinical Impression ? Pt now s/p R transmet amputation 3/17. Pt cleared to weight bear through his heel. Still awaiting post-op shoe as it was not present upon PT arrival today. Thus, coordinated with ortho tech. Pt presents with condition above and deficits mentioned below, see PT Problem List. PTA, he was living with his significant other in a 1-level house with 2 STE. Pt was mod I using a SPC. Currently, pt is requiring modA to transfer to stand and minA to take a few steps to transfer between surfaces using a RW. Pt displays deficits in strength, balance, and activity tolerance placing him at risk for falls. Recommending pt receive short-term rehab at a SNF to improve his functional mobility independence and safety prior to return home. Will continue to follow acutely. ?   ? ?Recommendations for follow up therapy are one component of a multi-disciplinary discharge planning process, led by the attending physician.  Recommendations may be updated based on patient status, additional functional criteria and insurance authorization. ? ?Follow Up Recommendations Skilled nursing-short term rehab (<3 hours/day) ? ?  ?Assistance Recommended at Discharge Frequent or constant Supervision/Assistance  ?Patient can return home with the following ? A lot of help with bathing/dressing/bathroom;Assistance with cooking/housework;Help with stairs or ramp for entrance;Two  people to help with walking and/or transfers;Assist for transportation ? ?  ?Equipment Recommendations Rolling walker (2 wheels);BSC/3in1  ?Recommendations for Other Services ?    ?  ?Functional Status Assessment Patient has had a recent decline in their functional status and demonstrates the ability to make significant improvements in function in a reasonable and predictable amount of time.  ? ?  ?Precautions / Restrictions Precautions ?Precautions: Fall ?Required Braces or Orthoses: Other Brace ?Other Brace: supposed to have post-op shoe, placed order 3/18 ?Restrictions ?Weight Bearing Restrictions: Yes ?RLE Weight Bearing: Partial weight bearing (heel only) ?RLE Partial Weight Bearing Percentage or Pounds:  (Heel Only)  ? ?  ? ?Mobility ? Bed Mobility ?  ?  ?  ?  ?  ?  ?  ?General bed mobility comments: OOB on commode upon arrival ?  ? ?Transfers ?Overall transfer level: Needs assistance ?Equipment used: Rolling walker (2 wheels) ?Transfers: Sit to/from Stand, Bed to chair/wheelchair/BSC ?Sit to Stand: Mod assist ?  ?Step pivot transfers: Min assist ?  ?  ?  ?General transfer comment: Pt needing modA to initiate buttocks clearance and maintain balance coming to stand from commode. Cues for hand placement. MinA to take several steps to turn and transfer commode > recliner. ?  ? ?Ambulation/Gait ?Ambulation/Gait assistance: Min assist ?Gait Distance (Feet): 6 Feet ?Assistive device: Rolling walker (2 wheels) ?Gait Pattern/deviations: Step-through pattern, Decreased stride length, Antalgic, Decreased dorsiflexion - right, Decreased weight shift to right, Trunk flexed ?Gait velocity: reduced ?Gait velocity interpretation: <1.31 ft/sec, indicative of household ambulator ?  ?General Gait Details: Pt with very flexed posture even when cued to push up through arms on RW to extend trunk. Pt with slow, antalgic gait pattern  stepping from commode > recliner nearby. MinA to steady. ? ?Stairs ?  ?  ?  ?  ?  ? ?Wheelchair  Mobility ?  ? ?Modified Rankin (Stroke Patients Only) ?  ? ?  ? ?Balance Overall balance assessment: Needs assistance ?Sitting-balance support: No upper extremity supported, Feet supported ?Sitting balance-Leahy Scale: Fair ?  ?  ?Standing balance support: Bilateral upper extremity supported, During functional activity, Reliant on assistive device for balance ?Standing balance-Leahy Scale: Poor ?Standing balance comment: Reliant on UE support and minA ?  ?  ?  ?  ?  ?  ?  ?  ?  ?  ?  ?   ? ? ? ?Pertinent Vitals/Pain Pain Assessment ?Pain Assessment: Faces ?Faces Pain Scale: Hurts even more ?Pain Location: R foot ?Pain Descriptors / Indicators: Discomfort, Grimacing, Operative site guarding ?Pain Intervention(s): Monitored during session, Limited activity within patient's tolerance, Repositioned  ? ? ?Home Living Family/patient expects to be discharged to:: Private residence ?Living Arrangements: Spouse/significant other ?Available Help at Discharge: Family;Available PRN/intermittently ?Type of Home: House ?Home Access: Stairs to enter ?Entrance Stairs-Rails: None ?Entrance Stairs-Number of Steps: 2 ?  ?Home Layout: One level ?Home Equipment: Kasandra Knudsen - single point ?   ?  ?Prior Function Prior Level of Function : Independent/Modified Independent ?  ?  ?  ?  ?  ?  ?Mobility Comments: Uses SPC for ambulation ?ADLs Comments: independent ?  ? ? ?Hand Dominance  ?   ? ?  ?Extremity/Trunk Assessment  ? Upper Extremity Assessment ?Upper Extremity Assessment: Defer to OT evaluation ?  ? ?Lower Extremity Assessment ?Lower Extremity Assessment: RLE deficits/detail ?RLE Deficits / Details: S/p transmet amputation with edema noted limiting ankle AROM ?  ? ?Cervical / Trunk Assessment ?Cervical / Trunk Assessment: Normal  ?Communication  ? Communication: No difficulties  ?Cognition Arousal/Alertness: Awake/alert ?Behavior During Therapy: [facility] for tasks assessed/performed ?Overall Cognitive Status: Within Functional Limits for tasks  assessed ?  ?  ?  ?  ?  ?  ?  ?  ?  ?  ?  ?  ?  ?  ?  ?  ?General Comments: Polite, cooperative; increased time to initiate/perform tasks. ?  ?  ? ?  ?General Comments General comments (skin integrity, edema, etc.): educated pt to elevate and perform ankle pumps on R leg ? ?  ?Exercises    ? ?Assessment/Plan  ?  ?PT Assessment Patient needs continued PT services  ?PT Problem List Decreased strength;Decreased range of motion;Decreased mobility;Pain;Impaired sensation;Decreased balance;Decreased knowledge of use of DME;Decreased activity tolerance;Decreased skin integrity ? ?   ?  ?PT Treatment Interventions Therapeutic exercise;Gait training;Patient/family education;Therapeutic activities;Functional mobility training;Balance training;DME instruction;Stair training;Neuromuscular re-education   ? ?PT Goals (Current goals can be found in the Care Plan section)  ?Acute Rehab PT Goals ?Patient Stated Goal: to get better ?PT Goal Formulation: With patient ?Time For Goal Achievement: 09/30/21 ?Potential to Achieve Goals: Fair ? ?  ?Frequency Min 3X/week ?  ? ? ?Co-evaluation   ?  ?  ?  ?  ? ? ?  ?AM-PAC PT "6 Clicks" Mobility  ?Outcome Measure Help needed turning from your back to your side while in a flat bed without using bedrails?: A Little ?Help needed moving from lying on your back to sitting on the side of a flat bed without using bedrails?: A Little ?Help needed moving to and from a bed to a chair (including a wheelchair)?: A Little ?Help needed standing up from a chair using your  arms (e.g., wheelchair or bedside chair)?: A Lot ?Help needed to walk in hospital room?: Total ?Help needed climbing 3-5 steps with a railing? : Total ?6 Click Score: 13 ? ?  ?End of Session Equipment Utilized During Treatment: Gait belt ?Activity Tolerance: Patient tolerated treatment well;Patient limited by pain ?Patient left: in chair;with call bell/phone within reach ?  ?PT Visit Diagnosis: Pain;Difficulty in walking, not elsewhere  classified (R26.2);Muscle weakness (generalized) (M62.81);Unsteadiness on feet (R26.81);Other abnormalities of gait and mobility (R26.89) ?Pain - Right/Left: Right ?Pain - part of body: Ankle and joints of fo

## 2021-09-20 NOTE — Progress Notes (Signed)
Orthopedic Tech Progress Note ?Patient Details:  ?Geovanie A Lasseter ?1955-11-08 ?[id number] ? ? ?Ortho Devices ?Type of Ortho Device: Postop shoe/boot ?Ortho Device/Splint Location: RLE ?Ortho Device/Splint Interventions: Ordered, Application, Adjustment ?  ?Post Interventions ?Patient Tolerated: Well ?Instructions Provided: Care of device, Adjustment of device, Poper ambulation with device ? ?Deshundra Waller Jeri Modena ?09/20/2021, 5:54 PM ? ?

## 2021-09-20 NOTE — Progress Notes (Signed)
?PROGRESS NOTE ? ? ? [name]  HGD:[id number] DOB: 14-Feb-1956 DOA: 09/12/2021 ?PCP: Harvie Junior, MD  ? ? ?Brief Narrative:  ?66 year old gentleman with history of type 2 diabetes on insulin, essential hypertension, peripheral neuropathy and obstructive sleep apnea presented with right foot redness progressed to blistering on the dorsum of the right foot and associated spreading inflammation rapidly progressing within 24 hours.  Patient does have history of nonhealing ulcer on the sole of the right foot.  Patient was seen by vascular surgery as outpatient and underwent right leg angiography and angioplasty by Dr. Unk Lightning on 3/8 for same.  In the emergency room patient with evidence of acute kidney injury, leukocytosis with WBC count 21.8 and thrombocytosis.  Blood cultures drawn.  Started on broad-spectrum antibiotics and admitted to the hospital. ?Patient was already scheduled to have surgery/ Bypass ? next week. ? ?Assessment & Plan: ?  ?Cellulitis and abscess of the right foot, diabetic foot infection, sepsis present on admission with leukocytosis, tachycardia and acute renal failure: ?Patient was started on IV vancomycin and cefepime for severe nonpurulent cellulitis.  Blood cultures negative so far.  Seen by vascular surgery.  Underwent right leg below-knee popliteal artery to posterior tibial artery bypass using nonreversed greater saphenous vein and fourth metatarsal ray amputation, left open on 09/15/2021.  Also underwent irrigation and debridement of the right foot by podiatry on 09/17/2021.  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  Management per podiatry.  He still on antibiotics.  Awaiting further recommendations regarding that from podiatry.  Vascular on board as well. ? ?Acute kidney injury: Recent known normal renal function 1.08-1.2.  Creatinine 2.14 on  presentation. Consistent with ATN from sepsis.  Resolved and renal function back to normal  now. ? ?Essential hypertension: Blood pressure fairly controlled we will continue reduced dose of lisinopril for now. ? ?Type 2 diabetes, uncontrolled with hyperglycemia.  Blood sugar controlled.  Continue Semglee 10 units and SSI. ? ?Obstructive sleep apnea: Not using CPAP at home as he could not tolerate that. ? ?GERD: On PPI.  Continue. ? ? ?DVT prophylaxis: SCD's Start: 09/15/21 1734 ?enoxaparin (LOVENOX) injection 40 mg Start: 09/13/21 1000 ? ? ?Code Status: Full code ?Family Communication: None at the bedside. ?Disposition Plan: Status is: Inpatient ?Remains inpatient appropriate because: Awaiting for podiatry to explain the plan and TOC to arrange the bed at SNF. ? ?Consultants:  ?Vascular surgery ? ?Procedures:  ?As above ? ?Antimicrobials:  ?Vancomycin and cefepime 3/10---- ? ? ?Subjective: ? ?Patient seen and examined.  No complaints. ? ?Objective: ?Vitals:  ? 09/19/21 1953 09/19/21 2329 09/20/21 0357 09/20/21 0959  ?BP: 126/67 110/60 129/67 125/65  ?Pulse: 73 73 75 73  ?Resp: '12 16 18 16  '$ ?Temp: 98.9 ?F (37.2 ?C) 98.9 ?F (37.2 ?C) 98 ?F (36.7 ?C) 98.4 ?F (36.9 ?C)  ?TempSrc: Oral Oral Oral Oral  ?SpO2: 98% 100% 100% 100%  ?Weight:      ?Height:      ? ? ?Intake/Output Summary (Last 24 hours) at 09/20/2021 1034 ?Last data filed at 09/20/2021 1000 ?Gross per 24 hour  ?Intake 1186 ml  ?Output 1900 ml  ?Net -714 ml  ? ? ?Filed Weights  ? 09/12/21 2256 09/14/21 0442 09/14/21 2018  ?Weight: 106.1 kg 102.6 kg 102 kg  ? ? ?Examination: ? ?General exam: Appears calm and comfortable  ?Respiratory system: Clear to auscultation. Respiratory effort normal. ?Cardiovascular system: S1 & S2 heard, RRR. No JVD, murmurs, rubs, gallops or clicks. No  pedal edema. ?Gastrointestinal system: Abdomen is nondistended, soft and nontender. No organomegaly or masses felt. Normal bowel sounds heard. ?Central nervous system: Alert and oriented. No focal neurological deficits. ?Extremities: Dressing in the right lower extremity. ?Skin:  No rashes, lesions or ulcers.  ?Psychiatry: Judgement and insight appear normal. Mood & affect appropriate.  ? ?Data Reviewed: I have personally reviewed following labs and imaging studies ? ?CBC: ?Recent Labs  ?Lab 09/16/21 ?0140 09/17/21 ?5809 09/18/21 ?0152 09/19/21 ?0150 09/20/21 ?0245  ?WBC 20.5* 16.3* 14.8* 14.3* 14.8*  ?NEUTROABS 15.6* 12.2* 10.7* 10.1* 10.3*  ?HGB 11.3* 11.4* 11.0* 11.0* 9.6*  ?HCT 35.4* 33.9* 34.0* 32.5* 28.1*  ?MCV 87.0 85.0 85.0 83.8 83.9  ?PLT 589* 557* 596* 578* 617*  ? ? ?Basic Metabolic Panel: ?Recent Labs  ?Lab 09/14/21 ?9833 09/15/21 ?8250 09/16/21 ?0140 09/18/21 ?0152  ?NA 133* 136 136 132*  ?K 3.8 4.1 4.6 3.8  ?CL 102 106 108 105  ?CO2 20* 18* 17* 19*  ?GLUCOSE 134* 115* 104* 178*  ?BUN '19 14 16 13  '$ ?CREATININE 1.32* 1.18 1.08 0.77  ?CALCIUM 8.1* 8.3* 8.0* 7.8*  ?MG 2.0  --   --   --   ?PHOS 2.3*  --   --   --   ? ? ?GFR: ?Estimated Creatinine Clearance: 109.3 mL/min (by C-G formula based on SCr of 0.77 mg/dL). ?Liver Function Tests: ?No results for input(s): AST, ALT, ALKPHOS, BILITOT, PROT, ALBUMIN in the last 168 hours. ? ?No results for input(s): LIPASE, AMYLASE in the last 168 hours. ?No results for input(s): AMMONIA in the last 168 hours. ?Coagulation Profile: ?No results for input(s): INR, PROTIME in the last 168 hours. ?Cardiac Enzymes: ?No results for input(s): CKTOTAL, CKMB, CKMBINDEX, TROPONINI in the last 168 hours. ?BNP (last 3 results) ?No results for input(s): PROBNP in the last 8760 hours. ?HbA1C: ?No results for input(s): HGBA1C in the last 72 hours. ? ?CBG: ?Recent Labs  ?Lab 09/19/21 ?1307 09/19/21 ?1521 09/19/21 ?1707 09/19/21 ?2115 09/20/21 ?0602  ?GLUCAP 116* 109* 122* 186* 153*  ? ? ?Lipid Profile: ?No results for input(s): CHOL, HDL, LDLCALC, TRIG, CHOLHDL, LDLDIRECT in the last 72 hours. ? ?Thyroid Function Tests: ?No results for input(s): TSH, T4TOTAL, FREET4, T3FREE, THYROIDAB in the last 72 hours. ?Anemia Panel: ?No results for input(s): VITAMINB12,  FOLATE, FERRITIN, TIBC, IRON, RETICCTPCT in the last 72 hours. ?Sepsis Labs: ?No results for input(s): PROCALCITON, LATICACIDVEN in the last 168 hours. ? ? ?Recent Results (from the past 240 hour(s))  ?Resp Panel by RT-PCR (Flu A&B, Covid)     Status: None  ? Collection Time: 09/13/21 12:29 AM  ? Specimen: Nasopharyngeal(NP) swabs in vial transport medium  ?Result Value Ref Range Status  ? SARS Coronavirus 2 by RT PCR NEGATIVE NEGATIVE Final  ?  Comment: (NOTE) ?SARS-CoV-2 target nucleic acids are NOT DETECTED. ? ?The SARS-CoV-2 RNA is generally detectable in upper respiratory ?specimens during the acute phase of infection. The lowest ?concentration of SARS-CoV-2 viral copies this assay can detect is ?138 copies/mL. A negative result does not preclude SARS-Cov-2 ?infection and should not be used as the sole basis for treatment or ?other patient management decisions. A negative result may occur with  ?improper specimen collection/handling, submission of specimen other ?than nasopharyngeal swab, presence of viral mutation(s) within the ?areas targeted by this assay, and inadequate number of viral ?copies(<138 copies/mL). A negative result must be combined with ?clinical observations, patient history, and epidemiological ?information. The expected result is Negative. ? ?Fact Sheet for Patients:  ?  EntrepreneurPulse.com.au ? ?Fact Sheet for Healthcare Providers:  ?IncredibleEmployment.be ? ?This test is no t yet approved or cleared by the [place] FDA and  ?has been authorized for detection and/or diagnosis of SARS-CoV-2 by ?FDA under an Emergency Use Authorization (EUA). This EUA will remain  ?in effect (meaning this test can be used) for the duration of the ?COVID-19 declaration under Section 564(b)(1) of the Act, 21 ?U.S.C.section 360bbb-3(b)(1), unless the authorization is terminated  ?or revoked sooner.  ? ? ?  ? Influenza A by PCR NEGATIVE NEGATIVE Final  ? Influenza B by PCR  NEGATIVE NEGATIVE Final  ?  Comment: (NOTE) ?The Xpert Xpress SARS-CoV-2/FLU/RSV plus assay is intended as an aid ?in the diagnosis of influenza from Nasopharyngeal swab specimens and ?should not be used as a sole b

## 2021-09-20 NOTE — Progress Notes (Signed)
?Subjective:  ?Patient ID: [name], male    DOB: 08-11-1955,  MRN: [id number] ? ?Patient seen at bedside this morning s/p right transmetatarsal amputation and irrigation and debridement of wound right foot.  Patient relates some pain this morning but managing. Denies n/v/f/c.  ? ?Past Medical History:  ?Diagnosis Date  ? Diabetes mellitus without complication ([place])   ? Hypertension   ? Neuropathy   ? PTSD (post-traumatic stress disorder)   ? Sleep apnea   ?  ? ?Past Surgical History:  ?Procedure Laterality Date  ? ABDOMINAL AORTOGRAM W/LOWER EXTREMITY Right 09/10/2021  ? Procedure: ABDOMINAL AORTOGRAM W/LOWER EXTREMITY;  Surgeon: [name], MD;  Location: [facility];  Service: Cardiovascular;  Laterality: Right;  ? ABDOMINAL HERNIA REPAIR    ? AMPUTATION Right 09/19/2021  ? Procedure: TRANSMETATARSAL AMPUTATION FOOT;  Surgeon: Lorenda Peck, MD;  Location: [facility];  Service: Podiatry;  Laterality: Right;  Surgical team to do local block  ? AMPUTATION TOE Right 09/15/2021  ? Procedure: AMPUTATION 4th TOE;  Surgeon: [name], MD;  Location: [facility];  Service: Vascular;  Laterality: Right;  ? BACK SURGERY    ? FEMORAL-TIBIAL BYPASS GRAFT Right 09/15/2021  ? Procedure: BYPASS GRAFT BELOW KNEE POPLITEAL ARTERY TO POSTERIOR TIBIAL ARTERY;  Surgeon: [name], MD;  Location: [facility];  Service: Vascular;  Laterality: Right;  ? IRRIGATION AND DEBRIDEMENT FOOT Right 09/17/2021  ? Procedure: IRRIGATION AND DEBRIDEMENT FOOT;  Surgeon: Lorenda Peck, MD;  Location: [facility];  Service: Podiatry;  Laterality: Right;  Suregeon will do anesthesia block  ? KNEE SURGERY    ? PERIPHERAL VASCULAR BALLOON ANGIOPLASTY  09/10/2021  ? Procedure: PERIPHERAL VASCULAR BALLOON ANGIOPLASTY;  Surgeon: [name], MD;  Location: [facility];  Service: Cardiovascular;;  rt sfa pta  ? SHOULDER SURGERY    ? VEIN HARVEST Right 09/15/2021  ? Procedure: GREATER SAPHENOUS VEIN HARVEST;  Surgeon: [name], MD;   Location: [facility];  Service: Vascular;  Laterality: Right;  ? ? ?CBC Latest Ref Rng & Units 09/20/2021 09/19/2021 09/18/2021  ?WBC 4.0 - 10.5 K/uL 14.8(H) 14.3(H) 14.8(H)  ?Hemoglobin 13.0 - 17.0 g/dL 9.6(L) 11.0(L) 11.0(L)  ?Hematocrit 39.0 - 52.0 % 28.1(L) 32.5(L) 34.0(L)  ?Platelets 150 - 400 K/uL 617(H) 578(H) 596(H)  ? ? ?BMP Latest Ref Rng & Units 09/18/2021 09/16/2021 09/15/2021  ?Glucose 70 - 99 mg/dL 178(H) 104(H) 115(H)  ?BUN 8 - 23 mg/dL '13 16 14  '$ ?Creatinine 0.61 - 1.24 mg/dL 0.77 1.08 1.18  ?Sodium 135 - 145 mmol/L 132(L) 136 136  ?Potassium 3.5 - 5.1 mmol/L 3.8 4.6 4.1  ?Chloride 98 - 111 mmol/L 105 108 106  ?CO2 22 - 32 mmol/L 19(L) 17(L) 18(L)  ?Calcium 8.9 - 10.3 mg/dL 7.8(L) 8.0(L) 8.3(L)  ? ? ? ?Objective:  ? ?Vitals:  ? 09/20/21 0959 09/20/21 1156  ?BP: 125/65 (!) 130/54  ?Pulse: 73 70  ?Resp: 16 18  ?Temp: 98.4 ?F (36.9 ?C) 98 ?F (36.7 ?C)  ?SpO2: 100% 100%  ? ? ?General:AA&O x 3. Normal mood and affect  ? ?Vascular: DP and PT pulses 2/4 bilateral.  ? ?Neruological. Epicritic sensation grossly intact.  ? ?Derm: Wound to dorsal aright foot with mostly granular base about 6 cm x 5 cm x 0.5 cm   No purulence noted today. No erythema or edema surrounding. Some early maceration of surrounding skin. Plantar and medial incisions well coapted and intact.  ? ? ? ? ? ? ? ? ?  MSK: MMT 5/5 in dorsiflexion, plantar flexion, inversion and eversion. Normal joint ROM without pain or crepitus.  ? ? ? ? ?Assessment & Plan:  ?Patient was evaluated and treated and all questions answered. ? ?DX: Right diabetic foot infection s/p right transmetatarsal amputation.  ?Wound care: saline WTD daily dressing changes ?Antibiotics: Per primary  ?DME: post-op shoe  ?Patient ok to begin heel weightbearing on the operative extremity.  ?Discussed with patient treatment plan.  ?Patient is ok for discharge from podiatry standpoint to rehab/SNF.  Feel we have source control of infection.Cultures and gram stain pending. Likely will need  antibiotics for 2 weeks upon discharge if cultures negative. Will need daily dressing changes. Will follow up in clinic in one week from discharge. We will get scheduled for him.  ? ?Lorenda Peck, MD ? ?Accessible via secure chat for questions or concerns. ? ?

## 2021-09-20 NOTE — Progress Notes (Signed)
Mobility Specialist Progress Note ? ? 09/20/21 1500  ?Mobility  ?Activity Transferred to/from [facility]  ?Level of Assistance Moderate assist, patient does 50-74%  ?Assistive Device Front wheel walker  ?RLE Weight Bearing PWB  ?RLE Partial Weight Bearing Percentage or Pounds  ?(Heel Only)  ?Distance Ambulated (ft) 2 ft  ?Activity Response Tolerated well  ?$Mobility charge 1 Mobility  ? ?Pt received in bed c/o of R foot pain but requesting to use BSC for possible BM. Min A for LE and trunk weakness to EOB, mod A for STS. Upon standing, pt in heavy flexed trunk posture and requiring mod cues for correcting. SPT to [facility] w/ call bell in reach, PT notified and RN notified about where pt was left.   ? ?Holland Falling ?Mobility Specialist ?Phone Number [phone] ? ?

## 2021-09-20 NOTE — Progress Notes (Addendum)
?  Progress Note ? ? ? ?09/20/2021 ?8:34 AM ?1 Day Post-Op ? ?Subjective:  sleepy. No major complaints ? ? ?Vitals:  ? 09/19/21 2329 09/20/21 0357  ?BP: 110/60 129/67  ?Pulse: 73 75  ?Resp: 16 18  ?Temp: 98.9 ?F (37.2 ?C) 98 ?F (36.7 ?C)  ?SpO2: 100% 100%  ? ?Physical Exam: ?Cardiac:  regular ?Lungs:  non labored ?Incisions:  RLE incisions are clean, dry and intact. Right foot dressed  ?Extremities:  RLE well perfused and warm. Doppler PT signal ?Neurologic: sleepy ? ?CBC ?   ?Component Value Date/Time  ? WBC 14.8 (H) 09/20/2021 0245  ? RBC 3.35 (L) 09/20/2021 0245  ? HGB 9.6 (L) 09/20/2021 0245  ? HCT 28.1 (L) 09/20/2021 0245  ? PLT 617 (H) 09/20/2021 0245  ? MCV 83.9 09/20/2021 0245  ? MCH 28.7 09/20/2021 0245  ? MCHC 34.2 09/20/2021 0245  ? RDW 14.6 09/20/2021 0245  ? LYMPHSABS 2.1 09/20/2021 0245  ? MONOABS 1.5 (H) 09/20/2021 0245  ? EOSABS 0.2 09/20/2021 0245  ? BASOSABS 0.1 09/20/2021 0245  ? ? ?BMET ?   ?Component Value Date/Time  ? NA 132 (L) 09/18/2021 0152  ? K 3.8 09/18/2021 0152  ? CL 105 09/18/2021 0152  ? CO2 19 (L) 09/18/2021 0152  ? GLUCOSE 178 (H) 09/18/2021 0152  ? BUN 13 09/18/2021 0152  ? CREATININE 0.77 09/18/2021 0152  ? CALCIUM 7.8 (L) 09/18/2021 0152  ? GFRNONAA >60 09/18/2021 0152  ? GFRAA >60 02/17/2015 2030  ? ? ?INR ?No results found for: INR ? ? ?Intake/Output Summary (Last 24 hours) at 09/20/2021 0834 ?Last data filed at 09/20/2021 0602 ?Gross per 24 hour  ?Intake 1186 ml  ?Output 900 ml  ?Net 286 ml  ? ? ? ?Assessment/Plan:  66 y.o. male is s/p Right leg below-knee popliteal artery to posterior tibial artery bypass using nonreversed greater saphenous vein and fourth metatarsal ray amputation, left open 3 Days Post Op.  ? ?1 Day post op from right TMA by Podiatry- wound care per Podiatry ?RLE well perfused and warm with Doppler PT signal ?On IV abx ?Leukocytosis trending down still 14 ?Mobilize as tolerated ? ?DVT prophylaxis:  lovenox ? ? ?Karoline Caldwell, PA-C ?[facility] ?[phone] ?09/20/2021 ?8:34 AM ? ?VASCULAR STAFF ADDENDUM: ?I have independently interviewed and examined the patient. ?I agree with the above.  ?Brisk doppler flow in PT. ?Local care to TMA per podiatry ?Mobilize as able. ? ?[name]. Stanford Breed, MD ?[facility] [place] ?Office Phone Number: [phone] ?09/20/2021 11:20 AM ? ? ?

## 2021-09-21 DIAGNOSIS — E11628 Type 2 diabetes mellitus with other skin complications: Secondary | ICD-10-CM | POA: Diagnosis not present

## 2021-09-21 DIAGNOSIS — L03119 Cellulitis of unspecified part of limb: Secondary | ICD-10-CM | POA: Diagnosis not present

## 2021-09-21 LAB — CBC WITH DIFFERENTIAL/PLATELET
Abs Immature Granulocytes: 0.59 K/uL — ABNORMAL HIGH (ref 0.00–0.07)
Basophils Absolute: 0.1 K/uL (ref 0.0–0.1)
Basophils Relative: 1 %
Eosinophils Absolute: 0.3 K/uL (ref 0.0–0.5)
Eosinophils Relative: 2 %
HCT: 27.4 % — ABNORMAL LOW (ref 39.0–52.0)
Hemoglobin: 9.1 g/dL — ABNORMAL LOW (ref 13.0–17.0)
Immature Granulocytes: 4 %
Lymphocytes Relative: 16 %
Lymphs Abs: 2.3 K/uL (ref 0.7–4.0)
MCH: 28.8 pg (ref 26.0–34.0)
MCHC: 33.2 g/dL (ref 30.0–36.0)
MCV: 86.7 fL (ref 80.0–100.0)
Monocytes Absolute: 1.4 K/uL — ABNORMAL HIGH (ref 0.1–1.0)
Monocytes Relative: 9 %
Neutro Abs: 9.7 K/uL — ABNORMAL HIGH (ref 1.7–7.7)
Neutrophils Relative %: 68 %
Platelets: 668 K/uL — ABNORMAL HIGH (ref 150–400)
RBC: 3.16 MIL/uL — ABNORMAL LOW (ref 4.22–5.81)
RDW: 14.7 % (ref 11.5–15.5)
WBC: 14.4 K/uL — ABNORMAL HIGH (ref 4.0–10.5)
nRBC: 0 % (ref 0.0–0.2)

## 2021-09-21 LAB — GLUCOSE, CAPILLARY
Glucose-Capillary: 176 mg/dL — ABNORMAL HIGH (ref 70–99)
Glucose-Capillary: 228 mg/dL — ABNORMAL HIGH (ref 70–99)
Glucose-Capillary: 145 mg/dL — ABNORMAL HIGH (ref 70–99)
Glucose-Capillary: 212 mg/dL — ABNORMAL HIGH (ref 70–99)

## 2021-09-21 MED ORDER — AMOXICILLIN-POT CLAVULANATE 875-125 MG PO TABS: 1.0000 | ORAL_TABLET | Freq: Two times a day (BID) | ORAL | Status: DC

## 2021-09-21 MED ORDER — CIPROFLOXACIN HCL 500 MG PO TABS: 500.0000 mg | ORAL_TABLET | Freq: Two times a day (BID) | ORAL | Status: DC

## 2021-09-21 MED ORDER — CEFADROXIL 500 MG PO CAPS
500.0000 mg | ORAL_CAPSULE | Freq: Two times a day (BID) | ORAL | Status: DC
  Administered 2021-09-21 – 2021-09-22 (×3): 500 mg via ORAL
  Filled 2021-09-21 (×4): qty 1

## 2021-09-21 NOTE — Progress Notes (Addendum)
?  Progress Note ? ? ? ?09/21/2021 ?8:21 AM ?2 Days Post-Op ? ?Subjective:  no complaints ? ? ?Vitals:  ? 09/21/21 0507 09/21/21 0743  ?BP: 93/63 133/61  ?Pulse: 74 73  ?Resp: 16 14  ?Temp: 98.6 ?F (37 ?C) 98.5 ?F (36.9 ?C)  ?SpO2: 100% 100%  ? ?Physical Exam: ?Cardiac:  regular ?Lungs:  non labored ?Incisions:  RLE incisions are c/d/I without swelling or hematoma ?Extremities:  RLE well perfused and warm. Doppler PT signal. Right foot dressed ?Neurologic: alert and oriented ? ?CBC ?   ?Component Value Date/Time  ? WBC 14.4 (H) 09/21/2021 0114  ? RBC 3.16 (L) 09/21/2021 0114  ? HGB 9.1 (L) 09/21/2021 0114  ? HCT 27.4 (L) 09/21/2021 0114  ? PLT 668 (H) 09/21/2021 0114  ? MCV 86.7 09/21/2021 0114  ? MCH 28.8 09/21/2021 0114  ? MCHC 33.2 09/21/2021 0114  ? RDW 14.7 09/21/2021 0114  ? LYMPHSABS 2.3 09/21/2021 0114  ? MONOABS 1.4 (H) 09/21/2021 0114  ? EOSABS 0.3 09/21/2021 0114  ? BASOSABS 0.1 09/21/2021 0114  ? ? ?BMET ?   ?Component Value Date/Time  ? NA 132 (L) 09/18/2021 0152  ? K 3.8 09/18/2021 0152  ? CL 105 09/18/2021 0152  ? CO2 19 (L) 09/18/2021 0152  ? GLUCOSE 178 (H) 09/18/2021 0152  ? BUN 13 09/18/2021 0152  ? CREATININE 0.77 09/18/2021 0152  ? CALCIUM 7.8 (L) 09/18/2021 0152  ? GFRNONAA >60 09/18/2021 0152  ? GFRAA >60 02/17/2015 2030  ? ? ?INR ?No results found for: INR ? ? ?Intake/Output Summary (Last 24 hours) at 09/21/2021 0821 ?Last data filed at 09/21/2021 0745 ?Gross per 24 hour  ?Intake 1940.78 ml  ?Output 2300 ml  ?Net -359.22 ml  ? ? ? ?Assessment/Plan:  66 y.o. male is s/p  Right leg below-knee popliteal artery to posterior tibial artery bypass using nonreversed greater saphenous vein and fourth metatarsal ray amputation, left open 4 Days Post op and  Right TMA by Podiatry  2 Days Post-Op  ? ?RLE remains well perfused and warm ?Incisions are intact and well appearing ?Dopper PT signal ?Right TMA wound care per Podiatry ?Okay for heel weight bearing only with podiatry ?2 weeks ABx per podiatry  rec ?Continue to mobilize ?Continue Aspirin, statin, plavix ?Okay for d/c from vascular standpoint. Will arrange close outpatient follow up in 2 weeks ? ? ?Karoline Caldwell, PA-C ?[facility] ?[phone] ?09/21/2021 ?8:21 AM ? ?VASCULAR STAFF ADDENDUM: ?I have independently interviewed and examined the patient. ?I agree with the above.  ? ?[name]. Stanford Breed, MD ?[facility] [place] ?Office Phone Number: [phone] ?09/21/2021 11:00 AM ? ? ?

## 2021-09-21 NOTE — Progress Notes (Addendum)
?PROGRESS NOTE ? ? ? [name]  LGX:[id number] DOB: 1956/06/28 DOA: 09/12/2021 ?PCP: Harvie Junior, MD  ? ? ?Brief Narrative:  ?66 year old gentleman with history of type 2 diabetes on insulin, essential hypertension, peripheral neuropathy and obstructive sleep apnea presented with right foot redness progressed to blistering on the dorsum of the right foot and associated spreading inflammation rapidly progressing within 24 hours.  Patient does have history of nonhealing ulcer on the sole of the right foot.  Patient was seen by vascular surgery as outpatient and underwent right leg angiography and angioplasty by Dr. Unk Lightning on 3/8 for same.  In the emergency room patient with evidence of acute kidney injury, leukocytosis with WBC count 21.8 and thrombocytosis.  Blood cultures drawn.  Started on broad-spectrum antibiotics and admitted to the hospital. ?Patient was already scheduled to have surgery/ Bypass ? next week. ? ?Assessment & Plan: ?  ?Cellulitis and abscess of the right foot, diabetic foot infection, sepsis present on admission with leukocytosis, tachycardia and acute renal failure: ?Patient was started on IV vancomycin and cefepime for severe nonpurulent cellulitis.  Blood cultures negative so far.  Seen by vascular surgery.  Underwent right leg below-knee popliteal artery to posterior tibial artery bypass using nonreversed greater saphenous vein and fourth metatarsal ray amputation, left open on 09/15/2021.  Also underwent irrigation and debridement of the right foot by podiatry on 09/17/2021.  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  Management per podiatry.  Seen and cleared by vascular surgery for discharge and cleared by podiatry for discharge as well.  Discussed with Dr. Blenda Mounts of podiatry who recommends transitioning to combination of oral Augmentin and ciprofloxacin.  One of the wound culture from 09/17/2021 is growing E. coli which is resistant to  ampicillin, I did inform this to Dr. Blenda Mounts. ? ?Acute kidney injury: Recent known normal renal function 1.08-1.2.  Creatinine 2.14 on  presentation. Consistent with ATN from sepsis.  Resolved and renal function back to normal now. ? ?Essential hypertension: Blood pressure fairly controlled we will continue reduced dose of lisinopril for now. ? ?Type 2 diabetes, uncontrolled with hyperglycemia.  Blood sugar controlled.  Continue Semglee 10 units and SSI. ? ?Obstructive sleep apnea: Not using CPAP at home as he could not tolerate that. ? ?GERD: On PPI.  Continue. ? ? ?DVT prophylaxis: SCD's Start: 09/15/21 1734 ?enoxaparin (LOVENOX) injection 40 mg Start: 09/13/21 1000 ? ? ?Code Status: Full code ?Family Communication: None at the bedside. ?Disposition Plan: Status is: Inpatient ?Remains inpatient appropriate because: Medically cleared.  Waiting for bed arrangement at SNF. ? ?Consultants:  ?Vascular surgery ? ?Procedures:  ?As above ? ?Antimicrobials:  ?Vancomycin and cefepime 3/10---- ? ? ?Subjective: ? ?Seen and examined.  Intermittent right lower extremity pain.  No other complaint. ? ?Objective: ?Vitals:  ? 09/21/21 0300 09/21/21 0310 09/21/21 0507 09/21/21 0743  ?BP:   93/63 133/61  ?Pulse: 77 72 74 73  ?Resp: '15 10 16 14  '$ ?Temp:   98.6 ?F (37 ?C) 98.5 ?F (36.9 ?C)  ?TempSrc:   Oral Oral  ?SpO2: 98% 100% 100% 100%  ?Weight:      ?Height:      ? ? ?Intake/Output Summary (Last 24 hours) at 09/21/2021 1005 ?Last data filed at 09/21/2021 0745 ?Gross per 24 hour  ?Intake 1690.78 ml  ?Output 1300 ml  ?Net 390.78 ml  ? ? ?Filed Weights  ? 09/12/21 2256 09/14/21 0442 09/14/21 2018  ?Weight: 106.1 kg 102.6 kg 102 kg  ? ? ?  Examination: ? ?General exam: Appears calm and comfortable  ?Respiratory system: Clear to auscultation. Respiratory effort normal. ?Cardiovascular system: S1 & S2 heard, RRR. No JVD, murmurs, rubs, gallops or clicks. No pedal edema. ?Gastrointestinal system: Abdomen is nondistended, soft and nontender. No  organomegaly or masses felt. Normal bowel sounds heard. ?Central nervous system: Alert and oriented. No focal neurological deficits. ?Extremities: Dressing in the right lower extremity. ?Skin: No rashes, lesions or ulcers.  ?Psychiatry: Judgement and insight appear normal. Mood & affect appropriate.  ? ?Data Reviewed: I have personally reviewed following labs and imaging studies ? ?CBC: ?Recent Labs  ?Lab 09/17/21 ?1572 09/18/21 ?0152 09/19/21 ?0150 09/20/21 ?6203 09/21/21 ?0114  ?WBC 16.3* 14.8* 14.3* 14.8* 14.4*  ?NEUTROABS 12.2* 10.7* 10.1* 10.3* 9.7*  ?HGB 11.4* 11.0* 11.0* 9.6* 9.1*  ?HCT 33.9* 34.0* 32.5* 28.1* 27.4*  ?MCV 85.0 85.0 83.8 83.9 86.7  ?PLT 557* 596* 578* 617* 668*  ? ? ?Basic Metabolic Panel: ?Recent Labs  ?Lab 09/15/21 ?5597 09/16/21 ?0140 09/18/21 ?0152  ?NA 136 136 132*  ?K 4.1 4.6 3.8  ?CL 106 108 105  ?CO2 18* 17* 19*  ?GLUCOSE 115* 104* 178*  ?BUN '14 16 13  '$ ?CREATININE 1.18 1.08 0.77  ?CALCIUM 8.3* 8.0* 7.8*  ? ? ?GFR: ?Estimated Creatinine Clearance: 109.3 mL/min (by C-G formula based on SCr of 0.77 mg/dL). ?Liver Function Tests: ?No results for input(s): AST, ALT, ALKPHOS, BILITOT, PROT, ALBUMIN in the last 168 hours. ? ?No results for input(s): LIPASE, AMYLASE in the last 168 hours. ?No results for input(s): AMMONIA in the last 168 hours. ?Coagulation Profile: ?No results for input(s): INR, PROTIME in the last 168 hours. ?Cardiac Enzymes: ?No results for input(s): CKTOTAL, CKMB, CKMBINDEX, TROPONINI in the last 168 hours. ?BNP (last 3 results) ?No results for input(s): PROBNP in the last 8760 hours. ?HbA1C: ?No results for input(s): HGBA1C in the last 72 hours. ? ?CBG: ?Recent Labs  ?Lab 09/20/21 ?0602 09/20/21 ?1155 09/20/21 ?1612 09/20/21 ?2132 09/21/21 ?0612  ?GLUCAP 153* 208* 225* 191* 176*  ? ? ?Lipid Profile: ?No results for input(s): CHOL, HDL, LDLCALC, TRIG, CHOLHDL, LDLDIRECT in the last 72 hours. ? ?Thyroid Function Tests: ?No results for input(s): TSH, T4TOTAL, FREET4, T3FREE,  THYROIDAB in the last 72 hours. ?Anemia Panel: ?No results for input(s): VITAMINB12, FOLATE, FERRITIN, TIBC, IRON, RETICCTPCT in the last 72 hours. ?Sepsis Labs: ?No results for input(s): PROCALCITON, LATICACIDVEN in the last 168 hours. ? ? ?Recent Results (from the past 240 hour(s))  ?Resp Panel by RT-PCR (Flu A&B, Covid)     Status: None  ? Collection Time: 09/13/21 12:29 AM  ? Specimen: Nasopharyngeal(NP) swabs in vial transport medium  ?Result Value Ref Range Status  ? SARS Coronavirus 2 by RT PCR NEGATIVE NEGATIVE Final  ?  Comment: (NOTE) ?SARS-CoV-2 target nucleic acids are NOT DETECTED. ? ?The SARS-CoV-2 RNA is generally detectable in upper respiratory ?specimens during the acute phase of infection. The lowest ?concentration of SARS-CoV-2 viral copies this assay can detect is ?138 copies/mL. A negative result does not preclude SARS-Cov-2 ?infection and should not be used as the sole basis for treatment or ?other patient management decisions. A negative result may occur with  ?improper specimen collection/handling, submission of specimen other ?than nasopharyngeal swab, presence of viral mutation(s) within the ?areas targeted by this assay, and inadequate number of viral ?copies(<138 copies/mL). A negative result must be combined with ?clinical observations, patient history, and epidemiological ?information. The expected result is Negative. ? ?Fact Sheet for Patients:  ?EntrepreneurPulse.com.au ? ?  Fact Sheet for Healthcare Providers:  ?IncredibleEmployment.be ? ?This test is no t yet approved or cleared by the [place] FDA and  ?has been authorized for detection and/or diagnosis of SARS-CoV-2 by ?FDA under an Emergency Use Authorization (EUA). This EUA will remain  ?in effect (meaning this test can be used) for the duration of the ?COVID-19 declaration under Section 564(b)(1) of the Act, 21 ?U.S.C.section 360bbb-3(b)(1), unless the authorization is terminated  ?or  revoked sooner.  ? ? ?  ? Influenza A by PCR NEGATIVE NEGATIVE Final  ? Influenza B by PCR NEGATIVE NEGATIVE Final  ?  Comment: (NOTE) ?The Xpert Xpress SARS-CoV-2/FLU/RSV plus assay is intended as an aid ?in the d

## 2021-09-21 NOTE — Progress Notes (Signed)
?Subjective:  ?Patient ID: [name], male    DOB: [birth year],  MRN: [id number] ? ?Patient seen at bedside this morning s/p right transmetatarsal amputation and irrigation and debridement of wound right foot.  Patient relates he is doing well. Denies n/v/f/c.  ? ?Past Medical History:  ?Diagnosis Date  ? Diabetes mellitus without complication ([place])   ? Hypertension   ? Neuropathy   ? PTSD (post-traumatic stress disorder)   ? Sleep apnea   ?  ? ?Past Surgical History:  ?Procedure Laterality Date  ? ABDOMINAL AORTOGRAM W/LOWER EXTREMITY Right 09/10/2021  ? Procedure: ABDOMINAL AORTOGRAM W/LOWER EXTREMITY;  Surgeon: [name], MD;  Location: [facility];  Service: Cardiovascular;  Laterality: Right;  ? ABDOMINAL HERNIA REPAIR    ? AMPUTATION Right 09/19/2021  ? Procedure: TRANSMETATARSAL AMPUTATION FOOT;  Surgeon: Lorenda Peck, MD;  Location: [facility];  Service: Podiatry;  Laterality: Right;  Surgical team to do local block  ? AMPUTATION TOE Right 09/15/2021  ? Procedure: AMPUTATION 4th TOE;  Surgeon: [name], MD;  Location: [facility];  Service: Vascular;  Laterality: Right;  ? BACK SURGERY    ? FEMORAL-TIBIAL BYPASS GRAFT Right 09/15/2021  ? Procedure: BYPASS GRAFT BELOW KNEE POPLITEAL ARTERY TO POSTERIOR TIBIAL ARTERY;  Surgeon: [name], MD;  Location: [facility];  Service: Vascular;  Laterality: Right;  ? IRRIGATION AND DEBRIDEMENT FOOT Right 09/17/2021  ? Procedure: IRRIGATION AND DEBRIDEMENT FOOT;  Surgeon: Lorenda Peck, MD;  Location: [facility];  Service: Podiatry;  Laterality: Right;  Suregeon will do anesthesia block  ? KNEE SURGERY    ? PERIPHERAL VASCULAR BALLOON ANGIOPLASTY  09/10/2021  ? Procedure: PERIPHERAL VASCULAR BALLOON ANGIOPLASTY;  Surgeon: [name], MD;  Location: [facility];  Service: Cardiovascular;;  rt sfa pta  ? SHOULDER SURGERY    ? VEIN HARVEST Right 09/15/2021  ? Procedure: GREATER SAPHENOUS VEIN HARVEST;  Surgeon: [name], MD;  Location: [facility];   Service: Vascular;  Laterality: Right;  ? ? ?CBC Latest Ref Rng & Units 09/21/2021 09/20/2021 09/19/2021  ?WBC 4.0 - 10.5 K/uL 14.4(H) 14.8(H) 14.3(H)  ?Hemoglobin 13.0 - 17.0 g/dL 9.1(L) 9.6(L) 11.0(L)  ?Hematocrit 39.0 - 52.0 % 27.4(L) 28.1(L) 32.5(L)  ?Platelets 150 - 400 K/uL 668(H) 617(H) 578(H)  ? ? ?BMP Latest Ref Rng & Units 09/18/2021 09/16/2021 09/15/2021  ?Glucose 70 - 99 mg/dL 178(H) 104(H) 115(H)  ?BUN 8 - 23 mg/dL '13 16 14  '$ ?Creatinine 0.61 - 1.24 mg/dL 0.77 1.08 1.18  ?Sodium 135 - 145 mmol/L 132(L) 136 136  ?Potassium 3.5 - 5.1 mmol/L 3.8 4.6 4.1  ?Chloride 98 - 111 mmol/L 105 108 106  ?CO2 22 - 32 mmol/L 19(L) 17(L) 18(L)  ?Calcium 8.9 - 10.3 mg/dL 7.8(L) 8.0(L) 8.3(L)  ? ? ? ?Objective:  ? ?Vitals:  ? 09/21/21 0743 09/21/21 1129  ?BP: 133/61 113/63  ?Pulse: 73 72  ?Resp: 14 17  ?Temp: 98.5 ?F (36.9 ?C) 98.2 ?F (36.8 ?C)  ?SpO2: 100% 100%  ? ? ?General:AA&O x 3. Normal mood and affect  ? ?Vascular: DP and PT pulses 2/4 bilateral.  ? ?Neruological. Epicritic sensation grossly intact.  ? ?Derm: Wound to dorsal aright foot with mostly granular base about 6 cm x 5 cm x 0.4 cm starting to fill in with more granular tissue.   No purulence noted today. No erythema or edema surrounding. Some early maceration of surrounding skin. Plantar and medial incisions well coapted and intact.  ? ? ?  MSK: MMT 5/5 in dorsiflexion, plantar flexion, inversion and eversion. Normal joint ROM without pain or crepitus.  ? ? ? ? ?Assessment & Plan:  ?Patient was evaluated and treated and all questions answered. ? ?DX: Right diabetic foot infection s/p right transmetatarsal amputation.  ?Wound care: saline WTD daily dressing changes ?Antibiotics: Per primary Will do two weeks of cefadroxil upon discharge.  ?DME: post-op shoe  ?Patient ok to begin heel weightbearing on the operative extremity.  ?Discussed with patient treatment plan.  ?Patient is ok for discharge from podiatry standpoint to rehab/SNF.  Feel we have source control of  infection.Cultures pending. Cultures have grown out E.coli. Will do cefodroxil to cover.  Will need daily dressing changes. Will follow up in clinic in one week from discharge. We will get scheduled for him.  ? ?Lorenda Peck, MD ? ?Accessible via secure chat for questions or concerns. ? ?

## 2021-09-21 NOTE — TOC Progression Note (Addendum)
Transition of Care (TOC) - Progression Note  ? ? ?Patient Details  ?Name: [name] ?MRN: [id number] ?Date of Birth: Jul 31, 1955 ? ?Transition of Care (TOC) CM/SW Contact  ?Vinie Sill, LCSW ?Phone Number: ?09/21/2021, 11:06 AM ? ?Clinical Narrative:    ? ?Insurance auth started -reference # [id number] ? ?Expected Discharge Plan: [place] ?Barriers to Discharge: Insurance Authorization ? ?Expected Discharge Plan and Services ?Expected Discharge Plan: [place] ?In-house Referral: Clinical Social Work ?  ?  ?  ?                ?  ?  ?  ?  ?  ?  ?  ?  ?  ?  ? ? ?Social Determinants of Health (SDOH) Interventions ?  ? ?Readmission Risk Interventions ?No flowsheet data found. ? ?

## 2021-09-22 DIAGNOSIS — E11628 Type 2 diabetes mellitus with other skin complications: Secondary | ICD-10-CM | POA: Diagnosis not present

## 2021-09-22 DIAGNOSIS — L03119 Cellulitis of unspecified part of limb: Secondary | ICD-10-CM | POA: Diagnosis not present

## 2021-09-22 LAB — CBC WITH DIFFERENTIAL/PLATELET
Abs Immature Granulocytes: 0.42 10*3/uL — ABNORMAL HIGH (ref 0.00–0.07)
Basophils Absolute: 0.1 10*3/uL (ref 0.0–0.1)
Basophils Relative: 1 %
Eosinophils Absolute: 0.3 10*3/uL (ref 0.0–0.5)
Eosinophils Relative: 3 %
HCT: 26.1 % — ABNORMAL LOW (ref 39.0–52.0)
Hemoglobin: 8.4 g/dL — ABNORMAL LOW (ref 13.0–17.0)
Immature Granulocytes: 3 %
Lymphocytes Relative: 18 %
Lymphs Abs: 2.3 10*3/uL (ref 0.7–4.0)
MCH: 27.9 pg (ref 26.0–34.0)
MCHC: 32.2 g/dL (ref 30.0–36.0)
MCV: 86.7 fL (ref 80.0–100.0)
Monocytes Absolute: 1.1 10*3/uL — ABNORMAL HIGH (ref 0.1–1.0)
Monocytes Relative: 9 %
Neutro Abs: 8.3 10*3/uL — ABNORMAL HIGH (ref 1.7–7.7)
Neutrophils Relative %: 66 %
Platelets: 858 10*3/uL — ABNORMAL HIGH (ref 150–400)
RBC: 3.01 MIL/uL — ABNORMAL LOW (ref 4.22–5.81)
RDW: 14.8 % (ref 11.5–15.5)
WBC: 12.5 10*3/uL — ABNORMAL HIGH (ref 4.0–10.5)
nRBC: 0 % (ref 0.0–0.2)

## 2021-09-22 LAB — BASIC METABOLIC PANEL
Anion gap: 7 (ref 5–15)
BUN: 6 mg/dL — ABNORMAL LOW (ref 8–23)
CO2: 22 mmol/L (ref 22–32)
Calcium: 7.5 mg/dL — ABNORMAL LOW (ref 8.9–10.3)
Chloride: 104 mmol/L (ref 98–111)
Creatinine, Ser: 0.7 mg/dL (ref 0.61–1.24)
GFR, Estimated: >60 mL/min (ref >60)
Glucose, Bld: 148 mg/dL — ABNORMAL HIGH (ref 70–99)
Potassium: 3.6 mmol/L (ref 3.5–5.1)
Sodium: 133 mmol/L — ABNORMAL LOW (ref 135–145)

## 2021-09-22 LAB — AEROBIC/ANAEROBIC CULTURE W GRAM STAIN (SURGICAL/DEEP WOUND): Culture: NO GROWTH

## 2021-09-22 LAB — GLUCOSE, CAPILLARY
Glucose-Capillary: 161 mg/dL — ABNORMAL HIGH (ref 70–99)
Glucose-Capillary: 217 mg/dL — ABNORMAL HIGH (ref 70–99)
Glucose-Capillary: 163 mg/dL — ABNORMAL HIGH (ref 70–99)

## 2021-09-22 LAB — SURGICAL PATHOLOGY

## 2021-09-22 MED ORDER — INSULIN GLARGINE-YFGN 100 UNIT/ML ~~LOC~~ SOLN
15.0000 [IU] | Freq: Two times a day (BID) | SUBCUTANEOUS | Status: DC
  Administered 2021-09-22: 5 [IU] via SUBCUTANEOUS
  Filled 2021-09-22 (×2): qty 0.15

## 2021-09-22 MED ORDER — LISINOPRIL 20 MG PO TABS: 20.0000 mg | ORAL_TABLET | Freq: Every day | ORAL | 0 refills | Status: DC

## 2021-09-22 MED ORDER — TRAMADOL HCL 50 MG PO TABS: 50.0000 mg | ORAL_TABLET | Freq: Four times a day (QID) | ORAL | 0 refills | Status: DC | PRN

## 2021-09-22 MED ORDER — CEFADROXIL 500 MG PO CAPS: 500.0000 mg | ORAL_CAPSULE | Freq: Two times a day (BID) | ORAL | 0 refills | Status: AC

## 2021-09-22 MED ORDER — OXYCODONE HCL 15 MG PO TABS: 15.0000 mg | ORAL_TABLET | Freq: Four times a day (QID) | ORAL | 0 refills | Status: DC

## 2021-09-22 NOTE — Progress Notes (Addendum)
[facility] ? ?Subjective  - Doing well. ? ? ?Objective ?127/78 ?75 ?98.9 ?F (37.2 ?C) (Oral) ?11 ?98% ? ?Intake/Output Summary (Last 24 hours) at 09/22/2021 0738 ?Last data filed at 09/22/2021 0605 ?Gross per 24 hour  ?Intake 500 ml  ?Output 1550 ml  ?Net -1050 ml  ? ? ?Right TMA dressing clean and dry ?Leg incision healing well, motor intact and PT doppler signal brisk ?Lungs non labored breathing ? ? ?Assessment/Planning: ?66 y.o. male is s/p  Right leg below-knee popliteal artery to posterior tibial artery bypass using nonreversed greater saphenous vein and fourth metatarsal ray amputation, left open 4 Days Post op and  Right TMA by Podiatry  3 Days Post-Op  ? ?Right LE well perfused with brisk PT signal ?Right TMA followed by DPM with heel weight bearing status ?F/U with vascula in 2 weeks for incision checks. ?Continue Aspirin, statin, plavix, and antibiotics per DPM ? ? ?[name] ?09/22/2021 ?7:38 AM ?-- ? ?Laboratory ?Lab Results: ?Recent Labs  ?  09/21/21 ?0114 09/22/21 ?0411  ?WBC 14.4* 12.5*  ?HGB 9.1* 8.4*  ?HCT 27.4* 26.1*  ?PLT 668* 858*  ? ?BMET ?Recent Labs  ?  09/22/21 ?0411  ?NA 133*  ?K 3.6  ?CL 104  ?CO2 22  ?GLUCOSE 148*  ?BUN 6*  ?CREATININE 0.70  ?CALCIUM 7.5*  ? ? ?COAG ?No results found for: INR, PROTIME ?No results found for: PTT ? ?VASCULAR STAFF ADDENDUM: ?I have independently interviewed and examined the patient. ?I agree with the above.  ? ? ?[name]. [name] Breed, MD ?[facility] ?Office Phone Number: [phone] ?09/22/2021 10:04 AM ? ? ? ?

## 2021-09-22 NOTE — Progress Notes (Addendum)
Subjective: ?Delfino Friesen [name] is a 66 y.o. male patient seen at bedside, resting comfortably in no acute distress s/p day # 3 Right transmetatarsal amputation and dorsal wound debridement performed by Dr. Blenda Mounts.  Patient is also status post vascular intervention.  Patient denies pain at surgical site, states that he is tired and has a hard time sleeping at night, denies calf pain, or any other constitutional symptoms at this time. No other issues noted.  ? ?Patient Active Problem List  ? Diagnosis Date Noted  ? Cellulitis in diabetic foot ([place]) 09/13/2021  ? Dyslipidemia 09/13/2021  ? Type 2 diabetes mellitus with peripheral neuropathy ([place]) 09/13/2021  ? Depression 09/13/2021  ? Essential hypertension 09/13/2021  ? AKI (acute kidney injury) ([place]) 09/13/2021  ? GERD without esophagitis 09/13/2021  ? Obstructive sleep apnea 09/13/2021  ? Gangrene of toe of right foot ([place]) 09/13/2021  ? ? ? ?Current Facility-Administered Medications:  ?  acetaminophen (TYLENOL) tablet 650 mg, 650 mg, Oral, Q6H PRN, 650 mg at 09/17/21 0556 **OR** acetaminophen (TYLENOL) suppository 650 mg, 650 mg, Rectal, Q6H PRN, Lorenda Peck, MD ?  albuterol (PROVENTIL) (2.5 MG/3ML) 0.083% nebulizer solution 2.5 mg, 2.5 mg, Inhalation, Q6H PRN, Lorenda Peck, MD ?  alum & mag hydroxide-simeth (MAALOX/MYLANTA) 200-200-20 MG/5ML suspension 15-30 mL, 15-30 mL, Oral, Q2H PRN, Lorenda Peck, MD ?  aspirin EC tablet 81 mg, 81 mg, Oral, Q0600, Lorenda Peck, MD, 81 mg at 09/22/21 0612 ?  atorvastatin (LIPITOR) tablet 80 mg, 80 mg, Oral, Daily, Lorenda Peck, MD, 80 mg at 09/22/21 9833 ?  bisacodyl (DULCOLAX) EC tablet 5 mg, 5 mg, Oral, Daily PRN, Lorenda Peck, MD ?  buPROPion (WELLBUTRIN XL) 24 hr tablet 300 mg, 300 mg, Oral, Daily, Lorenda Peck, MD, 300 mg at 09/22/21 8250 ?  cefadroxil (DURICEF) capsule 500 mg, 500 mg, Oral, BID, Ursula Beath, RPH, 500 mg at 09/22/21 5397 ?  cholecalciferol (VITAMIN D3) tablet 2,000 Units, 2,000  Units, Oral, BID, Lorenda Peck, MD, 2,000 Units at 09/22/21 0910 ?  clopidogrel (PLAVIX) tablet 75 mg, 75 mg, Oral, Q0600, Lorenda Peck, MD, 75 mg at 09/22/21 0612 ?  collagenase (SANTYL) ointment, , Topical, Daily, Lorenda Peck, MD, Given at 09/22/21 0515 ?  cyclobenzaprine (FLEXERIL) tablet 10 mg, 10 mg, Oral, BID PRN, Lorenda Peck, MD ?  docusate sodium (COLACE) capsule 100 mg, 100 mg, Oral, Daily, Lorenda Peck, MD, 100 mg at 09/21/21 6734 ?  donepezil (ARICEPT) tablet 5 mg, 5 mg, Oral, Daily, Lorenda Peck, MD, 5 mg at 09/22/21 0911 ?  enoxaparin (LOVENOX) injection 40 mg, 40 mg, Subcutaneous, Daily, Lorenda Peck, MD, 40 mg at 09/22/21 0910 ?  fluticasone furoate-vilanterol (BREO ELLIPTA) 100-25 MCG/ACT 1 puff, 1 puff, Inhalation, Daily, Lorenda Peck, MD, 1 puff at 09/21/21 0815 ?  gabapentin (NEURONTIN) capsule 600 mg, 600 mg, Oral, TID, Lorenda Peck, MD, 600 mg at 09/22/21 0910 ?  guaiFENesin-dextromethorphan (ROBITUSSIN DM) 100-10 MG/5ML syrup 15 mL, 15 mL, Oral, Q4H PRN, Lorenda Peck, MD ?  hydrALAZINE (APRESOLINE) injection 10 mg, 10 mg, Intravenous, Q6H PRN, Lorenda Peck, MD ?  insulin aspart (novoLOG) injection 0-15 Units, 0-15 Units, Subcutaneous, TID AC & HS, Lorenda Peck, MD, 3 Units at 09/22/21 1937 ?  insulin glargine-yfgn (SEMGLEE) injection 15 Units, 15 Units, Subcutaneous, BID, Pahwani, Ravi, MD ?  labetalol (NORMODYNE) injection 10 mg, 10 mg, Intravenous, Q10 min PRN, Lorenda Peck, MD ?  lisinopril (ZESTRIL) tablet 20 mg, 20 mg, Oral, Daily, Lorenda Peck, MD, 20 mg at 09/22/21 9024 ?  magnesium hydroxide (MILK OF MAGNESIA) suspension 30 mL, 30 mL, Oral, Daily PRN, Lorenda Peck, MD ?  magnesium sulfate IVPB 2 g 50 mL, 2 g, Intravenous, Daily PRN, Lorenda Peck, MD ?  meclizine (ANTIVERT) tablet 25 mg, 25 mg, Oral, TID PRN, Lorenda Peck, MD ?  methocarbamol (ROBAXIN) tablet 500 mg, 500 mg, Oral, TID AC & HS, Lorenda Peck, MD, 500 mg at 09/22/21  2694 ?  metoprolol tartrate (LOPRESSOR) injection 2-5 mg, 2-5 mg, Intravenous, Q2H PRN, Lorenda Peck, MD ?  metoprolol tartrate (LOPRESSOR) tablet 50 mg, 50 mg, Oral, BID, Lorenda Peck, MD, 50 mg at 09/22/21 8546 ?  morphine (PF) 2 MG/ML injection 2-5 mg, 2-5 mg, Intravenous, Q1H PRN, Lorenda Peck, MD, 4 mg at 09/21/21 2037 ?  OLANZapine (ZYPREXA) tablet 2.5 mg, 2.5 mg, Oral, QHS, Lorenda Peck, MD, 2.5 mg at 09/21/21 2056 ?  ondansetron (ZOFRAN) tablet 4 mg, 4 mg, Oral, Q6H PRN **OR** ondansetron (ZOFRAN) injection 4 mg, 4 mg, Intravenous, Q6H PRN, Lorenda Peck, MD ?  OXcarbazepine (TRILEPTAL) tablet 150 mg, 150 mg, Oral, BID, Lorenda Peck, MD, 150 mg at 09/22/21 2703 ?  oxyCODONE (Oxy IR/ROXICODONE) immediate release tablet 15 mg, 15 mg, Oral, QID PRN, Lorenda Peck, MD, 15 mg at 09/22/21 5009 ?  pantoprazole (PROTONIX) EC tablet 40 mg, 40 mg, Oral, Daily, Lorenda Peck, MD, 40 mg at 09/22/21 0910 ?  phenol (CHLORASEPTIC) mouth spray 1 spray, 1 spray, Mouth/Throat, PRN, Lorenda Peck, MD ?  polyvinyl alcohol (LIQUIFILM TEARS) 1.4 % ophthalmic solution 1 drop, 1 drop, Both Eyes, QID PRN, Lorenda Peck, MD ?  potassium chloride SA (KLOR-CON M) CR tablet 20-40 mEq, 20-40 mEq, Oral, Daily PRN, Lorenda Peck, MD ?  senna-docusate (Senokot-S) tablet 1 tablet, 1 tablet, Oral, QHS PRN, Lorenda Peck, MD ?  traMADol Veatrice Bourbon) tablet 50 mg, 50 mg, Oral, Q6H PRN, Lorenda Peck, MD, 50 mg at 09/20/21 1545 ?  traZODone (DESYREL) tablet 25 mg, 25 mg, Oral, QHS PRN, Lorenda Peck, MD ? ?Allergies  ?Allergen Reactions  ? Gemfibrozil Other (See Comments)  ?  Doesn't know its been awhile  ? Oxycontin [Oxycodone Hcl] Other (See Comments)  ?  Bad reaction "took me to a place I never wanna go again"  ? Pork-Derived Products   ? Simvastatin Other (See Comments)  ?  Says he doesn't know its been awhile  ? ? ? ?Objective: ?Today's Vitals  ? 09/22/21 3818 09/22/21 2993 09/22/21 1012 09/22/21 1056  ?BP:  127/65   (!) 122/57  ?Pulse: 83   70  ?Resp: 13   14  ?Temp: 98.1 ?F (36.7 ?C)   98 ?F (36.7 ?C)  ?TempSrc: Oral   Oral  ?SpO2: 100%   100%  ?Weight:      ?Height:      ?PainSc:  6  3    ? ? ?General: No acute distress ? ?Right Lower extremity: Dressing to Right foot clean, dry, intact. No strikethrough noted, Upon removal of dressings sutures intact with no dehiscence at amputation stump site however dorsally to the stump there is a large wound that measures greater than 5 cm with fibrogranular tissue and dorsal extensor tendons exposed, wound bed appears to be viable no evidence of necrosis, healthy bleeding margins, localized edema, erythema, no purulence, no malodor.  No pain to palpation to the stump.  No calf pain.  ?  ?Assessment and Plan:  ?Problem List Items Addressed This Visit   ? ?  ? Endocrine  ? * (Principal) Cellulitis  in diabetic foot ([place])  ?  - The patient will be admitted to a medical bed. ?- We will continue antibiotic therapy with IV vancomycin and cefepime for severe nonpurulent cellulitis. ?- Vascular surgery consult by Dr. Virl Cagey and possibly podiatry consult can be called in AM. ?- Pain management will be provided. ?- Wound care consult will be obtained. ?- We will hold off aspirin and Plavix for now as well as NSAIDs. ?  ?  ? Relevant Medications  ? atorvastatin (LIPITOR) tablet 80 mg  ? insulin aspart (novoLOG) injection 0-15 Units  ? aspirin EC tablet 81 mg  ? lisinopril (ZESTRIL) tablet 20 mg  ? insulin glargine-yfgn (SEMGLEE) injection 15 Units  ? lisinopril (ZESTRIL) 20 MG tablet (Start on 09/23/2021)  ? ?Other Visit Diagnoses   ? ? Diabetic foot infection ([place])    -  Primary  ? Relevant Medications  ? vancomycin (VANCOREADY) IVPB 2000 mg/400 mL (Completed)  ? atorvastatin (LIPITOR) tablet 80 mg  ? insulin aspart (novoLOG) injection 0-15 Units  ? vancomycin (VANCOREADY) IVPB 1500 mg/300 mL (Completed)  ? aspirin EC tablet 81 mg  ? lisinopril (ZESTRIL) tablet 20 mg  ? cefadroxil  (DURICEF) capsule 500 mg  ? insulin glargine-yfgn (SEMGLEE) injection 15 Units  ? lisinopril (ZESTRIL) 20 MG tablet (Start on 09/23/2021)  ? Acute kidney injury ([place])      ? ?  ? ? ?-Patient seen and evaluated at bedside

## 2021-09-22 NOTE — Progress Notes (Signed)
Occupational Therapy Treatment ?Patient Details ?Name: [name] ?MRN: [id number] ?DOB: [birth year] ?Today's Date: 09/22/2021 ? ? ?History of present illness 66 y/o male who presents on 09/13/21 due to right foot infection. 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 S/p R foot irrigation and debridement on 3/15. 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 PMH includes DM, HTN, sleep apnea. ?  ?OT comments ? Pt progressing towards established OT goals. Pt continues to present with drowsiness and decreased balance, strength, and activity tolerance impacting his functional performance. Despite fatigue, pt very motivated to participate in therapy. Pt performing step pivot to [facility] with Min-Mod A; unsuccessful for BM. Performing short distance mobility in room with Mod A and +2 for chair follow. Continue to recommend dc to SNF and will continue to follow acutely as admitted.   ? ?Recommendations for follow up therapy are one component of a multi-disciplinary discharge planning process, led by the attending physician.  Recommendations may be updated based on patient status, additional functional criteria and insurance authorization. ?   ?Follow Up Recommendations ? Skilled nursing-short term rehab (<3 hours/day)  ?  ?Assistance Recommended at Discharge Frequent or constant Supervision/Assistance  ?Patient can return home with the following ? A lot of help with bathing/dressing/bathroom;A lot of help with walking and/or transfers ?  ?Equipment Recommendations ? Other (comment)  ?  ?Recommendations for Other Services PT consult ? ?  ?Precautions / Restrictions Precautions ?Precautions: Fall ?Precaution Comments: Cleared by vascular for gait with post-op shoe 3/20 ?Required Braces or Orthoses: Other Brace ?Other Brace: post-op shoe ?Restrictions ?Weight  Bearing Restrictions: Yes ?RLE Weight Bearing: Partial weight bearing ?Other Position/Activity Restrictions: Heel only  ? ? ?  ? ?Mobility Bed Mobility ?  ?  ?  ?  ?  ?  ?  ?General bed mobility comments: In recliner upon arrival ?  ? ?Transfers ?Overall transfer level: Needs assistance ?Equipment used: Rolling walker (2 wheels) ?Transfers: Sit to/from Stand, Bed to chair/wheelchair/BSC ?Sit to Stand: Mod assist ?  ?  ?Step pivot transfers: Min assist ?  ?  ?General transfer comment: Mod A for power up and weight shift forward. Min A for maintaining balance to BSC. ?  ?  ?Balance Overall balance assessment: Needs assistance ?Sitting-balance support: No upper extremity supported, Feet supported ?Sitting balance-Leahy Scale: Fair ?  ?  ?Standing balance support: Bilateral upper extremity supported, During functional activity ?Standing balance-Leahy Scale: Poor ?  ?  ?  ?  ?  ?  ?  ?  ?  ?  ?  ?  ?   ? ?ADL either performed or assessed with clinical judgement  ? ?ADL Overall ADL's : Needs assistance/impaired ?  ?  ?  ?  ?  ?  ?  ?  ?  ?  ?Lower Body Dressing: Maximal assistance;Sit to/from stand ?Lower Body Dressing Details (indicate cue type and reason): Max A for donning post-op shoe ?Toilet Transfer: Moderate assistance;BSC/3in1;Rolling walker (2 wheels);Stand-pivot ?Toilet Transfer Details (indicate cue type and reason): Mod A for power up into standing and weight shift forward ?  ?  ?  ?  ?Functional mobility during ADLs: Moderate assistance;+2 for safety/equipment;Rolling walker (2 wheels) ?General ADL Comments: Pt performing stand pivot to BSC (unsucessful for BM) and then performing short distance mobility in room with RW. Pt requiring Mod A for sit<>stand. ?  ? ?  Extremity/Trunk Assessment Upper Extremity Assessment ?Upper Extremity Assessment: Overall WFL for tasks assessed ?  ?Lower Extremity Assessment ?Lower Extremity Assessment: Defer to PT evaluation ?  ?  ?  ? ?Vision   ?  ?  ?Perception   ?  ?Praxis   ?   ? ?Cognition Arousal/Alertness: Awake/alert (Slightly lethargic with medications) ?Behavior During Therapy: [facility] for tasks assessed/performed, Flat affect ?Overall Cognitive Status: Impaired/Different from baseline ?Area of Impairment: Problem solving ?  ?  ?  ?  ?  ?  ?  ?  ?  ?  ?  ?  ?  ?  ?Problem Solving: Slow processing ?General Comments: Requiring increased time. Feel due to medications. Aware and asking good questions. ?  ?  ?   ?Exercises   ? ?  ?Shoulder Instructions   ? ? ?  ?General Comments VSS  ? ? ?Pertinent Vitals/ Pain       Pain Assessment ?Pain Assessment: Faces ?Faces Pain Scale: Hurts even more ?Pain Location: R foot ?Pain Descriptors / Indicators: Discomfort, Grimacing, Operative site guarding ?Pain Intervention(s): Monitored during session, Repositioned ? ?Home Living   ?  ?  ?  ?  ?  ?  ?  ?  ?  ?  ?  ?  ?  ?  ?  ?  ?  ?  ? ?  ?Prior Functioning/Environment    ?  ?  ?  ?   ? ?Frequency ? Min 2X/week  ? ? ? ? ?  ?Progress Toward Goals ? ?OT Goals(current goals can now be found in the care plan section) ? Progress towards OT goals: Progressing toward goals ? ?Acute Rehab OT Goals ?OT Goal Formulation: With patient ?Time For Goal Achievement: 09/30/21 ?Potential to Achieve Goals: Good ?ADL Goals ?Pt Will Perform Lower Body Dressing: sit to/from stand;with min guard assist ?Pt Will Transfer to Toilet: with min guard assist;bedside commode;ambulating ?Pt Will Perform Toileting - Clothing Manipulation and hygiene: with supervision;sitting/lateral leans;sit to/from stand ?Additional ADL Goal #1: Pt will perform bed mobility with Supervision in preparation for ADLs  ?Plan Discharge plan remains appropriate   ? ?Co-evaluation ? ? ?   ?  ?  ?  ?  ? ?  ?AM-PAC OT "6 Clicks" Daily Activity     ?Outcome Measure ? ? Help from another person eating meals?: None ?Help from another person taking care of personal grooming?: A Little ?Help from another person toileting, which includes using toliet, bedpan, or  urinal?: A Lot ?Help from another person bathing (including washing, rinsing, drying)?: A Lot ?Help from another person to put on and taking off regular upper body clothing?: A Little ?Help from another person to put on and taking off regular lower body clothing?: A Lot ?6 Click Score: 16 ? ?  ?End of Session Equipment Utilized During Treatment: Gait belt;Rolling walker (2 wheels) ? ?OT Visit Diagnosis: Unsteadiness on feet (R26.81);Other abnormalities of gait and mobility (R26.89);Muscle weakness (generalized) (M62.81) ?  ?Activity Tolerance Patient tolerated treatment well ?  ?Patient Left in chair;with call bell/phone within reach;with chair alarm set;with nursing/sitter in room ?  ?Nurse Communication Mobility status ?  ? ?   ? ?Time: 2536-6440 ?OT Time Calculation (min): 29 min ? ?Charges: OT General Charges ?$OT Visit: 1 Visit ?OT Treatments ?$Self Care/Home Management : 8-22 mins ?$Therapeutic Activity: 8-22 mins ? ?Mamoudou Mulvehill MSOT, OTR/L ?Acute Rehab ?Pager: [phone] ?Office: [phone] ? ?Taelon Bendorf M Katlyn Muldrew ?09/22/2021, 10:26 AM ? ? ?

## 2021-09-22 NOTE — TOC Progression Note (Signed)
Transition of Care (TOC) - Progression Note  ? ? ?Patient Details  ?Name: [name] ?MRN: [id number] ?Date of Birth: 1956/04/09 ? ?Transition of Care (TOC) CM/SW Contact  ?Vinie Sill, LCSW ?Phone Number: ?09/22/2021, 10:11 AM ? ?Clinical Narrative:    ? ?Insurance remains pending  ? ?Thurmond Butts, MSW, LCSW ?Clinical Social Worker ? ? ? ?Expected Discharge Plan: [place] ?Barriers to Discharge: Insurance Authorization ? ?Expected Discharge Plan and Services ?Expected Discharge Plan: [place] ?In-house Referral: Clinical Social Work ?  ?  ?  ?                ?  ?  ?  ?  ?  ?  ?  ?  ?  ?  ? ? ?Social Determinants of Health (SDOH) Interventions ?  ? ?Readmission Risk Interventions ?No flowsheet data found. ? ?

## 2021-09-22 NOTE — Progress Notes (Signed)
?PROGRESS NOTE ? ? ? [name]  YKZ:[id number] DOB: [birth year] DOA: 09/12/2021 ?PCP: Harvie Junior, MD  ? ? ?Brief Narrative:  ?66 year old gentleman with history of type 2 diabetes on insulin, essential hypertension, peripheral neuropathy and obstructive sleep apnea presented with right foot redness progressed to blistering on the dorsum of the right foot and associated spreading inflammation rapidly progressing within 24 hours.  Patient does have history of nonhealing ulcer on the sole of the right foot.  Patient was seen by vascular surgery as outpatient and underwent right leg angiography and angioplasty by Dr. Unk Lightning on 3/8 for same.  In the emergency room patient with evidence of acute kidney injury, leukocytosis with WBC count 21.8 and thrombocytosis.  Blood cultures drawn.  Started on broad-spectrum antibiotics and admitted to the hospital. ?Patient was already scheduled to have surgery/ Bypass ? next week. ? ?Assessment & Plan: ?  ?Cellulitis and abscess of the right foot, diabetic foot infection, sepsis present on admission with leukocytosis, tachycardia and acute renal failure: ?Patient was started on IV vancomycin and cefepime for severe nonpurulent cellulitis.  Blood cultures negative so far.  Seen by vascular surgery.  Underwent right leg below-knee popliteal artery to posterior tibial artery bypass using nonreversed greater saphenous vein and fourth metatarsal ray amputation, left open on 09/15/2021.  Also underwent irrigation and debridement of the right foot by podiatry on 09/17/2021.  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  Management per podiatry.  Seen and cleared by vascular surgery for discharge and cleared by podiatry for discharge as well.  Patient on [place] and per podiatry recommendations, continue for 2 weeks.  Pain controlled. ? ?Acute kidney injury: Recent known normal renal function 1.08-1.2.  Creatinine 2.14 on  presentation. Consistent  with ATN from sepsis.  Resolved and renal function back to normal now. ? ?Essential hypertension: Blood pressure fairly controlled we will continue reduced dose of lisinopril for now. ? ?Type 2 diabetes, uncontrolled with hyperglycemia.  Blood sugar elevated, increase Semglee to 15 units twice daily. ? ?Obstructive sleep apnea: Not using CPAP at home as he could not tolerate that. ? ?GERD: On PPI.  Continue. ? ? ?DVT prophylaxis: SCD's Start: 09/15/21 1734 ?enoxaparin (LOVENOX) injection 40 mg Start: 09/13/21 1000 ? ? ?Code Status: Full code ?Family Communication: None at the bedside. ?Disposition Plan: Status is: Inpatient ?Remains inpatient appropriate because: Medically cleared.  Waiting for bed arrangement at SNF. ? ?Consultants:  ?Vascular surgery ? ?Procedures:  ?As above ? ?Antimicrobials:  ?Vancomycin and cefepime 3/10---- ? ? ?Subjective: ? ?Seen and examined.  No new complaint.  Pain controlled. ? ?Objective: ?Vitals:  ? 09/22/21 0017 09/22/21 0421 09/22/21 0808 09/22/21 1056  ?BP: (!) 113/54 127/78 127/65 (!) 122/57  ?Pulse: 76 75 83 70  ?Resp: '16 11 13 14  '$ ?Temp: 98.9 ?F (37.2 ?C) 98.9 ?F (37.2 ?C) 98.1 ?F (36.7 ?C) 98 ?F (36.7 ?C)  ?TempSrc: Oral Oral Oral Oral  ?SpO2: 98% 98% 100% 100%  ?Weight:      ?Height:      ? ? ?Intake/Output Summary (Last 24 hours) at 09/22/2021 1316 ?Last data filed at 09/22/2021 0930 ?Gross per 24 hour  ?Intake 480 ml  ?Output 1550 ml  ?Net -1070 ml  ? ? ?Filed Weights  ? 09/12/21 2256 09/14/21 0442 09/14/21 2018  ?Weight: 106.1 kg 102.6 kg 102 kg  ? ? ?Examination: ? ?General exam: Appears calm and comfortable  ?Respiratory system: Clear to auscultation. Respiratory effort normal. ?Cardiovascular system: S1 &  S2 heard, RRR. No JVD, murmurs, rubs, gallops or clicks. No pedal edema. ?Gastrointestinal system: Abdomen is nondistended, soft and nontender. No organomegaly or masses felt. Normal bowel sounds heard. ?Central nervous system: Alert and oriented. No focal neurological  deficits. ?Extremities: Dressing in the right lower extremity. ?Psychiatry: Judgement and insight appear normal. Mood & affect appropriate.  ? ?Data Reviewed: I have personally reviewed following labs and imaging studies ? ?CBC: ?Recent Labs  ?Lab 09/18/21 ?0152 09/19/21 ?0150 09/20/21 ?6269 09/21/21 ?0114 09/22/21 ?0411  ?WBC 14.8* 14.3* 14.8* 14.4* 12.5*  ?NEUTROABS 10.7* 10.1* 10.3* 9.7* 8.3*  ?HGB 11.0* 11.0* 9.6* 9.1* 8.4*  ?HCT 34.0* 32.5* 28.1* 27.4* 26.1*  ?MCV 85.0 83.8 83.9 86.7 86.7  ?PLT 596* 578* 617* 668* 858*  ? ? ?Basic Metabolic Panel: ?Recent Labs  ?Lab 09/16/21 ?0140 09/18/21 ?0152 09/22/21 ?0411  ?NA 136 132* 133*  ?K 4.6 3.8 3.6  ?CL 108 105 104  ?CO2 17* 19* 22  ?GLUCOSE 104* 178* 148*  ?BUN 16 13 6*  ?CREATININE 1.08 0.77 0.70  ?CALCIUM 8.0* 7.8* 7.5*  ? ? ?GFR: ?Estimated Creatinine Clearance: 109.3 mL/min (by C-G formula based on SCr of 0.7 mg/dL). ?Liver Function Tests: ?No results for input(s): AST, ALT, ALKPHOS, BILITOT, PROT, ALBUMIN in the last 168 hours. ? ?No results for input(s): LIPASE, AMYLASE in the last 168 hours. ?No results for input(s): AMMONIA in the last 168 hours. ?Coagulation Profile: ?No results for input(s): INR, PROTIME in the last 168 hours. ?Cardiac Enzymes: ?No results for input(s): CKTOTAL, CKMB, CKMBINDEX, TROPONINI in the last 168 hours. ?BNP (last 3 results) ?No results for input(s): PROBNP in the last 8760 hours. ?HbA1C: ?No results for input(s): HGBA1C in the last 72 hours. ? ?CBG: ?Recent Labs  ?Lab 09/21/21 ?1127 09/21/21 ?1719 09/21/21 ?2124 09/22/21 ?0610 09/22/21 ?1059  ?GLUCAP 228* 145* 212* 161* 217*  ? ? ?Lipid Profile: ?No results for input(s): CHOL, HDL, LDLCALC, TRIG, CHOLHDL, LDLDIRECT in the last 72 hours. ? ?Thyroid Function Tests: ?No results for input(s): TSH, T4TOTAL, FREET4, T3FREE, THYROIDAB in the last 72 hours. ?Anemia Panel: ?No results for input(s): VITAMINB12, FOLATE, FERRITIN, TIBC, IRON, RETICCTPCT in the last 72 hours. ?Sepsis  Labs: ?No results for input(s): PROCALCITON, LATICACIDVEN in the last 168 hours. ? ? ?Recent Results (from the past 240 hour(s))  ?Resp Panel by RT-PCR (Flu A&B, Covid)     Status: None  ? Collection Time: 09/13/21 12:29 AM  ? Specimen: Nasopharyngeal(NP) swabs in vial transport medium  ?Result Value Ref Range Status  ? SARS Coronavirus 2 by RT PCR NEGATIVE NEGATIVE Final  ?  Comment: (NOTE) ?SARS-CoV-2 target nucleic acids are NOT DETECTED. ? ?The SARS-CoV-2 RNA is generally detectable in upper respiratory ?specimens during the acute phase of infection. The lowest ?concentration of SARS-CoV-2 viral copies this assay can detect is ?138 copies/mL. A negative result does not preclude SARS-Cov-2 ?infection and should not be used as the sole basis for treatment or ?other patient management decisions. A negative result may occur with  ?improper specimen collection/handling, submission of specimen other ?than nasopharyngeal swab, presence of viral mutation(s) within the ?areas targeted by this assay, and inadequate number of viral ?copies(<138 copies/mL). A negative result must be combined with ?clinical observations, patient history, and epidemiological ?information. The expected result is Negative. ? ?Fact Sheet for Patients:  ?EntrepreneurPulse.com.au ? ?Fact Sheet for Healthcare Providers:  ?IncredibleEmployment.be ? ?This test is no t yet approved or cleared by the [place] and  ?has been authorized  for detection and/or diagnosis of SARS-CoV-2 by ?FDA under an Emergency Use Authorization (EUA). This EUA will remain  ?in effect (meaning this test can be used) for the duration of the ?COVID-19 declaration under Section 564(b)(1) of the Act, 21 ?U.S.C.section 360bbb-3(b)(1), unless the authorization is terminated  ?or revoked sooner.  ? ? ?  ? Influenza A by PCR NEGATIVE NEGATIVE Final  ? Influenza B by PCR NEGATIVE NEGATIVE Final  ?  Comment: (NOTE) ?The Xpert Xpress  SARS-CoV-2/FLU/RSV plus assay is intended as an aid ?in the diagnosis of influenza from Nasopharyngeal swab specimens and ?should not be used as a sole basis for treatment. Nasal washings and ?aspirates are unacceptable

## 2021-09-22 NOTE — Progress Notes (Signed)
Physical Therapy Treatment ?Patient Details ?Name: [name] ?MRN: [id number] ?DOB: 1956/05/02 ?Today's Date: 09/22/2021 ? ? ?History of Present Illness 66 y/o male who presents on 09/13/21 due to right foot infection. 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 S/p R foot irrigation and debridement on 3/15. 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 PMH includes DM, HTN, sleep apnea. ? ?  ?PT Comments  ? ? Pt received in supine, agreeable to therapy session and with good participation and tolerance for transfer training and seated/standing exercises. Pt fatigued after transfer training and requesting to remain seated EOB to eat meal that arrived. No post-op shoe found when PTA arrived to room so ortho tech contacted and brought a shoe for him for RLE (black post-op shoe from ED as regular white & blue OR post-op shoe did not fit over dressing). Pt needing up to modA for transfers and quick to fatigue with pre-gait standing exercises and sidesteps along EOB. Pt continues to benefit from PT services to progress toward functional mobility goals.   ?Recommendations for follow up therapy are one component of a multi-disciplinary discharge planning process, led by the attending physician.  Recommendations may be updated based on patient status, additional functional criteria and insurance authorization. ? ?Follow Up Recommendations ? Skilled nursing-short term rehab (<3 hours/day) ?  ?  ?Assistance Recommended at Discharge Frequent or constant Supervision/Assistance  ?Patient can return home with the following A lot of help with bathing/dressing/bathroom;Assistance with cooking/housework;Help with stairs or ramp for entrance;Two people to help with walking and/or transfers;Assist for transportation ?  ?Equipment Recommendations ? Rolling walker (2  wheels);BSC/3in1  ?  ?Recommendations for Other Services   ? ? ?  ?Precautions / Restrictions Precautions ?Precautions: Fall ?Precaution Comments: Cleared by vascular for gait with post-op shoe 3/20 ?Required Braces or Orthoses: Other Brace ?Other Brace: post-op shoe (brought to room 3/20 during PT session, no shoe was found in room at beginning of session) ?Restrictions ?Weight Bearing Restrictions: Yes ?RLE Weight Bearing: Partial weight bearing ?Other Position/Activity Restrictions: Heel only  ?  ? ?Mobility ? Bed Mobility ?Overal bed mobility: Needs Assistance ?Bed Mobility: Rolling, Sidelying to Sit ?Rolling: Min guard ?Sidelying to sit: Mod assist, HOB elevated ?  ?  ?  ?General bed mobility comments: increased time/cues to perform; pt sitting EOB to eat meal at end of session, bed alarm on ?  ? ?Transfers ?Overall transfer level: Needs assistance ?Equipment used: Rolling walker (2 wheels) ?Transfers: Sit to/from Stand, Bed to chair/wheelchair/BSC ?Sit to Stand: Mod assist ?  ?  ?  ?  ?  ?General transfer comment: heavy Mod A for power up and weight shift forward. Use of momentum strategy needed for anterior weight shifting and power up. Cues for UE placement needed prior to standing to RW. ?  ? ?Ambulation/Gait ?Ambulation/Gait assistance: Min assist ?  ?Assistive device: Rolling walker (2 wheels) ?  ?  ?  ?Pre-gait activities: standing RLE hip flexion and forward/side steps with RW along EOB; pt defers OOB to chair due to upcoming DC and food arriving to room at end of session ?  ? ? ?Stairs ?  ?  ?  ?  ?  ? ? ?Wheelchair Mobility ?  ? ?Modified Rankin (Stroke Patients Only) ?  ? ? ?  ?Balance Overall balance assessment: Needs assistance ?Sitting-balance support: No upper extremity  supported, Feet supported ?Sitting balance-Leahy Scale: Fair ?  ?  ?Standing balance support: Bilateral upper extremity supported, During functional activity ?Standing balance-Leahy Scale: Poor ?  ?  ?  ?  ?  ?  ?  ?  ?  ?  ?  ?   ?  ? ?  ?Cognition Arousal/Alertness: Awake/alert ?Behavior During Therapy: [facility] for tasks assessed/performed, Flat affect ?Overall Cognitive Status: Impaired/Different from baseline ?Area of Impairment: Problem solving, Following commands, Memory ?  ?  ?  ?  ?  ?  ?  ?  ?  ?  ?Memory: Decreased short-term memory, Decreased recall of precautions ?Following Commands: Follows one step commands with increased time ?  ?  ?Problem Solving: Slow processing, Requires verbal cues ?General Comments: Requiring increased time. Pt reports "seeing numbers and shapes, like a pac man" and hearing voices (adult and child voices) for "the past few weeks", RN/MD notified. ?  ?  ? ?  ?Exercises General Exercises - Lower Extremity ?Long Arc Quad: AAROM, Right, 10 reps, Seated ?Hip ABduction/ADduction: AROM, Both, 10 reps, Seated ?Hip Flexion/Marching: AROM, Right, 20 reps, Seated, Standing (x10 reps ea posture) ? ?  ?General Comments General comments (skin integrity, edema, etc.): SpO2 100% on RA and HR 70's-80's bpm ?  ?  ? ?Pertinent Vitals/Pain Pain Assessment ?Pain Assessment: Faces ?Faces Pain Scale: Hurts little more ?Pain Location: R foot and glute ?Pain Descriptors / Indicators: Discomfort, Grimacing, Operative site guarding ?Pain Intervention(s): Limited activity within patient's tolerance, Monitored during session, Repositioned  ? ? ?Home Living   ?  ?  ?  ?  ?  ?  ?  ?  ?  ?   ?  ?Prior Function    ?  ?  ?   ? ?PT Goals (current goals can now be found in the care plan section) Acute Rehab PT Goals ?Patient Stated Goal: to get better ?PT Goal Formulation: With patient ?Time For Goal Achievement: 09/30/21 ?Progress towards PT goals: Progressing toward goals ? ?  ?Frequency ? ? ? Min 3X/week ? ? ? ?  ?PT Plan Current plan remains appropriate  ? ? ?Co-evaluation   ?  ?  ?  ?  ? ?  ?AM-PAC PT "6 Clicks" Mobility   ?Outcome Measure ? Help needed turning from your back to your side while in a flat bed without using bedrails?: A  Little ?Help needed moving from lying on your back to sitting on the side of a flat bed without using bedrails?: A Lot ?Help needed moving to and from a bed to a chair (including a wheelchair)?: A Lot ?Help needed standing up from a chair using your arms (e.g., wheelchair or bedside chair)?: A Lot ?Help needed to walk in hospital room?: Total ?Help needed climbing 3-5 steps with a railing? : Total ?6 Click Score: 11 ? ?  ?End of Session Equipment Utilized During Treatment: Gait belt;Other (comment) (RLE post-op shoe) ?Activity Tolerance: Patient tolerated treatment well;Patient limited by pain ?Patient left: in bed;with call bell/phone within reach;with bed alarm set;Other (comment) (pt seated EOB to eat meal) ?Nurse Communication: Mobility status;Other (comment) (now has post-op shoe) ?PT Visit Diagnosis: Pain;Difficulty in walking, not elsewhere classified (R26.2);Muscle weakness (generalized) (M62.81);Unsteadiness on feet (R26.81);Other abnormalities of gait and mobility (R26.89) ?Pain - Right/Left: Right ?Pain - part of body: Ankle and joints of foot ?  ? ? ?Time: 8144-8185 ?PT Time Calculation (min) (ACUTE ONLY): 24 min ? ?Charges:  $Therapeutic Exercise: 8-22 mins ?$Therapeutic Activity: 8-22  mins          ?          ? ?Averly Ericson P., PTA ?Acute Rehabilitation Services ?Pager: [phone] ?Office: [phone]  ? ? ?[name] ?09/22/2021, 4:08 PM ? ?

## 2021-09-22 NOTE — TOC Transition Note (Addendum)
Transition of Care (TOC) - CM/SW Discharge Note ? ? ?Patient Details  ?Name: [name] ?MRN: [id number] ?Date of Birth: [birth year] ? ?Transition of Care (TOC) CM/SW Contact:  ?Vinie Sill, LCSW ?Phone Number: ?09/22/2021, 2:55 PM ? ? ?Clinical Narrative:    ? ?Received insurance authorization for SNF. ? ?Patient will Discharge to: [facility] ?Discharge Date: 03/20/20232 ?Family Notified: Evelena Peat ?Transport By: Corey Harold ? ?Per MD patient is ready for discharge. RN, patient, and facility notified of discharge. Discharge Summary sent to facility. RN given number for [phone], Room 304-A. Ambulance transport requested for patient.  ? ?Clinical Social Worker signing off. ? ?Thurmond Butts, MSW, LCSW ?Clinical Social Worker ? ? ? ? ?Final next level of care: [place] ?Barriers to Discharge: Barriers Resolved ? ? ?Patient Goals and CMS Choice ?  ?  ?  ? ?Discharge Placement ?  ?           ?Patient chooses bed at: [facility] ?Patient to be transferred to facility by: [facility] ?Name of family member notified: sig/other Evelena Peat ?Patient and family notified of of transfer: 09/22/21 ? ?Discharge Plan and Services ?In-house Referral: Clinical Social Work ?  ?           ?  ?  ?  ?  ?  ?  ?  ?  ?  ?  ? ?Social Determinants of Health (SDOH) Interventions ?  ? ? ?Readmission Risk Interventions ?No flowsheet data found. ? ? ? ? ?

## 2021-09-22 NOTE — Progress Notes (Signed)
Orthopedic Tech Progress Note ?Patient Details:  ?Armin A Degrazia ?[birth year] ?[id number] ? ?Ortho Devices ?Type of Ortho Device: Postop shoe/boot ?Ortho Device/Splint Location: RLE ?Ortho Device/Splint Interventions: Ordered, Application ?  ?Post Interventions ?Patient Tolerated: Well ?Instructions Provided: Care of device, Adjustment of device, Poper ambulation with device ? ?[name] ?09/22/2021, 3:53 PM ?Delivered and applied post op shoe. ?

## 2021-09-22 NOTE — Progress Notes (Signed)
Patient discharged to [facility]. Telemetry box removed, CCMD notified. PIVs removed. Called to give report to [facility] with no answer. [facility] picked up patient for transport to facility. ? ?Daymon Larsen, RN  ?

## 2021-09-22 NOTE — Discharge Summary (Signed)
PatientPhysician Discharge Summary  ?[name] KZL:[id number] DOB: October 26, 1955 DOA: 09/12/2021 ? ?PCP: Harvie Junior, MD ? ?Admit date: 09/12/2021 ?Discharge date: 09/22/2021 ?30 Day Unplanned Readmission Risk Score   ? ?Flowsheet Row ED to Hosp-Admission (Current) from 09/12/2021 in [facility]E CV SURGICAL PROGRESSIVE CARE  ?30 Day Unplanned Readmission Risk Score (%) 26.36 Filed at 09/22/2021 1200  ? ?  ? ? This score is the patient's risk of an unplanned readmission within 30 days of being discharged (0 -100%). The score is based on dignosis, age, lab data, medications, orders, and past utilization.   ?Low:  0-14.9   Medium: 15-21.9   High: 22-29.9   Extreme: 30 and above ? ?  ? ?  ? ? ? ?Admitted From: Home ?Disposition: SNF ? ?Recommendations for Outpatient Follow-up:  ?Follow up with PCP in 1-2 weeks ?Please obtain BMP/CBC in one week ?Follow-up with vascular surgery in 2 weeks ?Follow-up with podiatry in 1 to 2 weeks ?Please follow up with your PCP on the following pending results: ?Unresulted Labs (From admission, onward)  ? ?  Start     Ordered  ? 09/14/21 0500  CBC with Differential/Platelet  Daily,   R     ? 09/13/21 0747  ? ?  ?  ? ?  ?  ? ? ?Home Health: None ?Equipment/Devices: None ? ?Discharge Condition: Stable ?CODE STATUS: Full code ?Diet recommendation: Diabetic and cardiac ? ?Subjective: Seen and examined.  No complaints other than right lower extremity pain which is controlled with the current pain medications. ? ?Brief/Interim Summary: 66 year old gentleman with history of type 2 diabetes on insulin, essential hypertension, peripheral neuropathy and obstructive sleep apnea presented with right foot redness progressed to blistering on the dorsum of the right foot and associated spreading inflammation rapidly progressing within 24 hours.  Patient does have history of nonhealing ulcer on the sole of the right foot.  Patient was seen by vascular surgery as outpatient and underwent right leg  angiography and angioplasty by Dr. Unk Lightning on 3/8 for same.  In the emergency room patient with evidence of acute kidney injury, leukocytosis with WBC count 21.8 and thrombocytosis.  Blood cultures drawn.  Started on broad-spectrum antibiotics and admitted to the hospital.  Admitted with following issues with ?  ?Cellulitis and abscess of the right foot, diabetic foot infection, sepsis present on admission with leukocytosis, tachycardia and acute renal failure: ?Patient was started on IV vancomycin and cefepime for severe nonpurulent cellulitis.  Blood cultures negative so far.  Seen by vascular surgery.  Underwent right leg below-knee popliteal artery to posterior tibial artery bypass using nonreversed greater saphenous vein and fourth metatarsal ray amputation, left open on 09/15/2021.  Also underwent irrigation and debridement of the right foot by podiatry on 09/17/2021.  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  Seen and cleared by vascular surgery for discharge and cleared by podiatry for discharge as well.  Patient on [place] and per podiatry recommendations, continue for 2 weeks.  He has received 1 day, he will be discharged on 13 days.  He needs to follow-up with vascular surgery in 2 weeks and podiatry in 1 to 2 weeks.  Discharged on pain medications as below. ?  ?Acute kidney injury: Recent known normal renal function 1.08-1.2.  Creatinine 2.14 on  presentation. Consistent with ATN from sepsis.  Resolved and renal function back to normal now. ?  ?Essential hypertension: All his antihypertensives were on hold, blood pressure remained fairly controlled, then started  to elevate, he was resumed on low-dose of lisinopril and based on the data, he is being discharged on that.  Discontinuing rest of the medications. ? ?Type 2 diabetes, uncontrolled with hyperglycemia.  Blood sugar elevated, this was managed with Semglee.  He will resume his home medications which will include  metformin, Jardiance and others as listed below. ? ?Obstructive sleep apnea: Not using CPAP at home as he could not tolerate that. ? ?GERD: On PPI.  Continue. ? ?Discharge plan was discussed with patient and/or family member and they verbalized understanding and agreed with it.  ?Discharge Diagnoses:  ?Principal Problem: ?  Cellulitis in diabetic foot ([place]) ?Active Problems: ?  AKI (acute kidney injury) ([place]) ?  Type 2 diabetes mellitus with peripheral neuropathy ([facility]) ?  Gangrene of toe of right foot ([place]) ?  Dyslipidemia ?  Essential hypertension ?  Depression ?  GERD without esophagitis ?  Obstructive sleep apnea ? ? ? ?Discharge Instructions ? ? ?Allergies as of 09/22/2021   ? ?   Reactions  ? Gemfibrozil Other (See Comments)  ? Doesn't know its been awhile  ? Oxycontin [oxycodone Hcl] Other (See Comments)  ? Bad reaction "took me to a place I never wanna go again"  ? Pork-derived Products   ? Simvastatin Other (See Comments)  ? Says he doesn't know its been awhile  ? ?  ? ?  ?Medication List  ?  ? ?STOP taking these medications   ? ?cyclobenzaprine 10 MG tablet ?Commonly known as: FLEXERIL ?  ?methocarbamol 500 MG tablet ?Commonly known as: ROBAXIN ?  ?olmesartan-hydrochlorothiazide 40-12.5 MG tablet ?Commonly known as: BENICAR HCT ?  ?ondansetron 8 MG disintegrating tablet ?Commonly known as: Zofran ODT ?  ? ?  ? ?TAKE these medications   ? ?albuterol 108 (90 Base) MCG/ACT inhaler ?Commonly known as: VENTOLIN HFA ?Inhale 2 puffs into the lungs every 6 (six) hours as needed for wheezing or shortness of breath. ?  ?amLODipine 5 MG tablet ?Commonly known as: NORVASC ?Take 5 mg by mouth every morning. ?  ?aspirin EC 81 MG tablet ?Take 1 tablet (81 mg total) by mouth daily. Swallow whole. ?  ?atorvastatin 80 MG tablet ?Commonly known as: LIPITOR ?Take 80 mg by mouth at bedtime. ?  ?buPROPion 300 MG 24 hr tablet ?Commonly known as: WELLBUTRIN XL ?Take 300 mg by mouth every morning. ?  ?carboxymethylcellulose 0.5 %  Soln ?Commonly known as: REFRESH PLUS ?Place 1 drop into both eyes 4 (four) times daily as needed (dry eyes). ?  ?cefadroxil 500 MG capsule ?Commonly known as: DURICEF ?Take 1 capsule (500 mg total) by mouth 2 (two) times daily for 13 days. ?  ?cholecalciferol 25 MCG (1000 UNIT) tablet ?Commonly known as: VITAMIN D ?Take 1,000 Units by mouth daily. ?  ?clopidogrel 75 MG tablet ?Commonly known as: Plavix ?Take 1 tablet (75 mg total) by mouth daily. ?  ?diclofenac 75 MG EC tablet ?Commonly known as: VOLTAREN ?Take 75 mg by mouth 2 (two) times daily. ?  ?donepezil 5 MG tablet ?Commonly known as: ARICEPT ?Take 5 mg by mouth daily. ?  ?empagliflozin 25 MG Tabs tablet ?Commonly known as: JARDIANCE ?Take 12.5 mg by mouth daily. ?  ?fluticasone furoate-vilanterol 100-25 MCG/INH Aepb ?Commonly known as: Breo Ellipta ?Inhale 1 puff into the lungs daily. ?  ?gabapentin 300 MG capsule ?Commonly known as: NEURONTIN ?Take 300 mg by mouth 2 (two) times daily. ?  ?gabapentin 600 MG tablet ?Commonly known as: NEURONTIN ?Take 600 mg  by mouth 2 (two) times daily as needed (when 300 mg capsules are not available). ?  ?GLIPIZIDE PO ?Take 1 tablet by mouth 2 (two) times daily. ?  ?insulin aspart protamine- aspart (70-30) 100 UNIT/ML injection ?Commonly known as: NOVOLOG MIX 70/30 ?Inject 80 Units into the skin 2 (two) times daily with a meal. ?  ?lisinopril 20 MG tablet ?Commonly known as: ZESTRIL ?Take 1 tablet (20 mg total) by mouth daily. ?Start taking on: September 23, 2021 ?What changed:  ?medication strength ?how much to take ?when to take this ?  ?lurasidone 40 MG Tabs tablet ?Commonly known as: LATUDA ?Take 40 mg by mouth daily with supper. ?  ?meclizine 25 MG tablet ?Commonly known as: ANTIVERT ?Take 1 tablet (25 mg total) by mouth 3 (three) times daily as needed for dizziness. ?  ?metFORMIN 1000 MG tablet ?Commonly known as: GLUCOPHAGE ?Take 1,000 mg by mouth 2 (two) times daily with a meal. ?  ?metoprolol tartrate 50 MG  tablet ?Commonly known as: LOPRESSOR ?Take 50 mg by mouth 2 (two) times daily. ?  ?OLANZapine 2.5 MG tablet ?Commonly known as: ZYPREXA ?Take 2.5 mg by mouth at bedtime. ?  ?omeprazole 20 MG capsule ?Commonly known as: PRILO

## 2021-09-23 ENCOUNTER — Encounter: Payer: Self-pay | Admitting: Internal Medicine

## 2021-09-23 ENCOUNTER — Non-Acute Institutional Stay (SKILLED_NURSING_FACILITY): Payer: Medicare HMO | Admitting: Adult Health

## 2021-09-23 ENCOUNTER — Encounter: Payer: Self-pay | Admitting: Adult Health

## 2021-09-23 DIAGNOSIS — I739 Peripheral vascular disease, unspecified: Secondary | ICD-10-CM

## 2021-09-23 DIAGNOSIS — Z9189 Other specified personal risk factors, not elsewhere classified: Secondary | ICD-10-CM | POA: Insufficient documentation

## 2021-09-23 DIAGNOSIS — N179 Acute kidney failure, unspecified: Secondary | ICD-10-CM | POA: Diagnosis not present

## 2021-09-23 DIAGNOSIS — E1142 Type 2 diabetes mellitus with diabetic polyneuropathy: Secondary | ICD-10-CM

## 2021-09-23 DIAGNOSIS — F333 Major depressive disorder, recurrent, severe with psychotic symptoms: Secondary | ICD-10-CM

## 2021-09-23 DIAGNOSIS — I10 Essential (primary) hypertension: Secondary | ICD-10-CM

## 2021-09-23 DIAGNOSIS — E11628 Type 2 diabetes mellitus with other skin complications: Secondary | ICD-10-CM | POA: Diagnosis not present

## 2021-09-23 DIAGNOSIS — L03119 Cellulitis of unspecified part of limb: Secondary | ICD-10-CM

## 2021-09-23 MED ORDER — OXYCODONE HCL 15 MG PO TABS: 15.0000 mg | ORAL_TABLET | Freq: Four times a day (QID) | ORAL | 0 refills | Status: DC | PRN

## 2021-09-23 NOTE — Progress Notes (Signed)
? ?Location:  [facility] ?Nursing Home Room Number: 818 H ?Place of Service:  SNF (31) ?Provider:  Durenda Age, DNP, FNP-BC ? ?Patient Care Team: ?Harvie Junior, MD as PCP - General (Family Medicine) ?Lavonna Monarch, MD as Consulting Physician (Dermatology) ? ?Extended Emergency Contact Information ?Primary Emergency Contact: Ogburn,[name] ?Mobile Phone: [phone] ?Relation: Significant other ? ?Code Status:  Full Code ? ?Goals of care: Advanced Directive information ?Advanced Directives 09/13/2021  ?Does Patient Have a Medical Advance Directive? -  ?Would patient like information on creating a medical advance directive? No - Patient declined  ? ? ? ?Chief Complaint  ?Patient presents with  ? Acute Visit  ?  Follow-up hospitalization  ? ? ?HPI:  ?Pt is a 66 y.o. male who was admitted to [facility] on  09/22/21 post hospital admission 09/12/21 to 09/22/21. He has a PMH of type 2 diabetes mellitus, peripheral neuropathy and obstructive  sleep apnea. He presented to the hospital with right foot redness progressed to blistering on the dorsum of  the right foot and associated spreading inflammation rapidly progressing within 24 hours. He had an angiography and angioplasty performed by Dr. Unk Lightning, Vascular Surgery, on 3/8. Labs in ED showed creatinine 1.85, wbc 21.8,  thrombocytosis of 623 and lactic acid 1.8. Blood cultures were drawn. He was started on IV Vancomycin and Zosyn for severe nonpurulent cellulitis. Blood cultures were negative. Vascular surgery was consulted and underwent right leg below knee popliteal artery to posterior tibial artery bypass using nonreversed greater saphenous vein and fourth metatarsal ray amputation, left open on 09/15/21. He, also, underwent irrigation and debridement of the right foot by podiatry on 09/17/21. 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 He was started on Duricef X 1 day in the hospital and to  continue X 13 more days upon discharge. Creatinine was 0.70, normal, on discharge. ? ?He was seen in his room today. He stated that he is "allergic to Oxycontin but not to Oxycodone" and that he has been getting it in the hospital without adverse reaction. Simvastatin is listed as one of his allergies. He stated that he has been taking Atorvastatin and is not allergic to it.   ? ? ?Past Medical History:  ?Diagnosis Date  ? Diabetes mellitus without complication ([place])   ? Hypertension   ? Neuropathy   ? PTSD (post-traumatic stress disorder)   ? Sleep apnea   ? ?Past Surgical History:  ?Procedure Laterality Date  ? ABDOMINAL AORTOGRAM W/LOWER EXTREMITY Right 09/10/2021  ? Procedure: ABDOMINAL AORTOGRAM W/LOWER EXTREMITY;  Surgeon: [name], MD;  Location: [facility];  Service: Cardiovascular;  Laterality: Right;  ? ABDOMINAL HERNIA REPAIR    ? AMPUTATION Right 09/19/2021  ? Procedure: TRANSMETATARSAL AMPUTATION FOOT;  Surgeon: Lorenda Peck, MD;  Location: [facility];  Service: Podiatry;  Laterality: Right;  Surgical team to do local block  ? AMPUTATION TOE Right 09/15/2021  ? Procedure: AMPUTATION 4th TOE;  Surgeon: [name], MD;  Location: [facility];  Service: Vascular;  Laterality: Right;  ? BACK SURGERY    ? FEMORAL-TIBIAL BYPASS GRAFT Right 09/15/2021  ? Procedure: BYPASS GRAFT BELOW KNEE POPLITEAL ARTERY TO POSTERIOR TIBIAL ARTERY;  Surgeon: [name], MD;  Location: [facility];  Service: Vascular;  Laterality: Right;  ? IRRIGATION AND DEBRIDEMENT FOOT Right 09/17/2021  ? Procedure: IRRIGATION AND DEBRIDEMENT FOOT;  Surgeon: Lorenda Peck, MD;  Location: [facility];  Service: Podiatry;  Laterality: Right;  Suregeon will do anesthesia block  ? KNEE SURGERY    ? PERIPHERAL VASCULAR BALLOON ANGIOPLASTY  09/10/2021  ? Procedure: PERIPHERAL VASCULAR BALLOON ANGIOPLASTY;  Surgeon: [name], MD;  Location: [facility];  Service: Cardiovascular;;  rt sfa pta  ? SHOULDER SURGERY    ? VEIN HARVEST  Right 09/15/2021  ? Procedure: GREATER SAPHENOUS VEIN HARVEST;  Surgeon: [name], MD;  Location: [facility];  Service: Vascular;  Laterality: Right;  ? ? ?Allergies  ?Allergen Reactions  ? Gemfibrozil Other (See Comments)  ?  Doesn't know its been awhile  ? Oxycontin [Oxycodone Hcl] Other (See Comments)  ?  Bad reaction "took me to a place I never wanna go again"  ? Pork-Derived Products   ? Simvastatin Other (See Comments)  ?  Says he doesn't know its been awhile  ? ? ?Outpatient Encounter Medications as of 09/23/2021  ?Medication Sig  ? albuterol (VENTOLIN HFA) 108 (90 Base) MCG/ACT inhaler Inhale 2 puffs into the lungs every 6 (six) hours as needed for wheezing or shortness of breath.  ? amLODipine (NORVASC) 5 MG tablet Take 5 mg by mouth every morning.  ? aspirin EC 81 MG tablet Take 1 tablet (81 mg total) by mouth daily. Swallow whole.  ? atorvastatin (LIPITOR) 80 MG tablet Take 80 mg by mouth at bedtime.  ? buPROPion (WELLBUTRIN XL) 300 MG 24 hr tablet Take 300 mg by mouth every morning.  ? carboxymethylcellulose (REFRESH PLUS) 0.5 % SOLN Place 1 drop into both eyes 4 (four) times daily as needed (dry eyes). (Patient not taking: Reported on 09/17/2021)  ? cefadroxil (DURICEF) 500 MG capsule Take 1 capsule (500 mg total) by mouth 2 (two) times daily for 13 days.  ? cholecalciferol (VITAMIN D) 25 MCG (1000 UNIT) tablet Take 1,000 Units by mouth daily.  ? clopidogrel (PLAVIX) 75 MG tablet Take 1 tablet (75 mg total) by mouth daily.  ? diclofenac (VOLTAREN) 75 MG EC tablet Take 75 mg by mouth 2 (two) times daily.  ? donepezil (ARICEPT) 5 MG tablet Take 5 mg by mouth daily.  ? empagliflozin (JARDIANCE) 25 MG TABS tablet Take 12.5 mg by mouth daily.  ? fluticasone furoate-vilanterol (BREO ELLIPTA) 100-25 MCG/INH AEPB Inhale 1 puff into the lungs daily.  ? gabapentin (NEURONTIN) 300 MG capsule Take 300 mg by mouth 2 (two) times daily.  ? gabapentin (NEURONTIN) 600 MG tablet Take 600 mg by mouth 2 (two) times daily  as needed (when 300 mg capsules are not available).  ? GLIPIZIDE PO Take 1 tablet by mouth 2 (two) times daily. (Patient not taking: Reported on 09/17/2021)  ? insulin aspart protamine- aspart (NOVOLOG MIX 70/30) (70-30) 100 UNIT/ML injection Inject 80 Units into the skin 2 (two) times daily with a meal.  ? lisinopril (ZESTRIL) 20 MG tablet Take 1 tablet (20 mg total) by mouth daily.  ? lurasidone (LATUDA) 40 MG TABS tablet Take 40 mg by mouth daily with supper.  ? meclizine (ANTIVERT) 25 MG tablet Take 1 tablet (25 mg total) by mouth 3 (three) times daily as needed for dizziness. (Patient not taking: Reported on 09/17/2021)  ? metFORMIN (GLUCOPHAGE) 1000 MG tablet Take 1,000 mg by mouth 2 (two) times daily with a meal.  ? metoprolol tartrate (LOPRESSOR) 50 MG tablet Take 50 mg by mouth 2 (two) times daily.  ? OLANZapine (ZYPREXA) 2.5 MG tablet Take 2.5 mg by mouth at bedtime.  ? omeprazole (PRILOSEC) 20 MG capsule Take 20 mg by  mouth at bedtime.  ? OXcarbazepine (TRILEPTAL) 150 MG tablet Take 150 mg by mouth 2 (two) times daily. (Patient not taking: Reported on 09/17/2021)  ? oxyCODONE (ROXICODONE) 15 MG immediate release tablet Take 1 tablet (15 mg total) by mouth every 6 (six) hours as needed for pain.  ? Semaglutide, 1 MG/DOSE, (OZEMPIC, 1 MG/DOSE,) 2 MG/1.5ML SOPN Inject 1 mg into the skin every Sunday. (Patient not taking: Reported on 09/17/2021)  ? tadalafil (CIALIS) 20 MG tablet Take 10 mg by mouth daily as needed for erectile dysfunction.  ? [DISCONTINUED] oxyCODONE (ROXICODONE) 15 MG immediate release tablet Take 1 tablet (15 mg total) by mouth 4 (four) times daily.  ? [DISCONTINUED] oxyCODONE (ROXICODONE) 15 MG immediate release tablet Take 1 tablet (15 mg total) by mouth every 6 (six) hours as needed for pain.  ? [DISCONTINUED] traMADol (ULTRAM) 50 MG tablet Take 1 tablet (50 mg total) by mouth every 6 (six) hours as needed.  ? ?No facility-administered encounter medications on file as of 09/23/2021.   ? ? ?Review of Systems  ?Constitutional:  Negative for activity change, appetite change and fever.  ?HENT:  Negative for sore throat.   ?Eyes: Negative.   ?Cardiovascular:  Negative for chest pain and leg swell

## 2021-09-24 LAB — AEROBIC/ANAEROBIC CULTURE W GRAM STAIN (SURGICAL/DEEP WOUND)

## 2021-09-25 ENCOUNTER — Non-Acute Institutional Stay (SKILLED_NURSING_FACILITY): Payer: Medicare HMO | Admitting: Internal Medicine

## 2021-09-25 ENCOUNTER — Encounter: Payer: Self-pay | Admitting: Internal Medicine

## 2021-09-25 DIAGNOSIS — E1142 Type 2 diabetes mellitus with diabetic polyneuropathy: Secondary | ICD-10-CM | POA: Diagnosis not present

## 2021-09-25 DIAGNOSIS — E11628 Type 2 diabetes mellitus with other skin complications: Secondary | ICD-10-CM | POA: Diagnosis not present

## 2021-09-25 DIAGNOSIS — N179 Acute kidney failure, unspecified: Secondary | ICD-10-CM | POA: Diagnosis not present

## 2021-09-25 DIAGNOSIS — Z9189 Other specified personal risk factors, not elsewhere classified: Secondary | ICD-10-CM

## 2021-09-25 DIAGNOSIS — L03119 Cellulitis of unspecified part of limb: Secondary | ICD-10-CM

## 2021-09-25 DIAGNOSIS — G4733 Obstructive sleep apnea (adult) (pediatric): Secondary | ICD-10-CM

## 2021-09-25 DIAGNOSIS — I10 Essential (primary) hypertension: Secondary | ICD-10-CM

## 2021-09-25 NOTE — Progress Notes (Signed)
? ?NURSING HOME LOCATION:  [place] ?ROOM NUMBER:  [id number] ? ?CODE STATUS:  Full Code ? ?PCP:  York Ram MD ? ?This is a comprehensive admission note to this SNFperformed on this date less than 30 days from date of admission. ?Included are preadmission medical/surgical history; reconciled medication list; family history; social history and comprehensive review of systems.  ?Corrections and additions to the records were documented. Comprehensive physical exam was also performed. Additionally a clinical summary was entered for each active diagnosis pertinent to this admission in the Problem List to enhance continuity of care. ? ?HPI: He was hospitalized 3/10 - 09/22/2021 presenting with cellulitis of the right foot which progressed to blistering over 24 hours.  This is in the context of a nonhealing ulcer on the sole of the right foot for which Dr Vonna Kotyk Robins,VVS had performed right leg angiography and angioplasty on 3/8 as an outpatient. ?At presentation he had AKI with a creatinine of 2.14 and GFR 33.  Leukocytosis was present with a white count of 21,800 with associated thrombocytosis.  Empiric broad-spectrum antibiotics were initiated with vancomycin and cefepime. ?VVS performed right below the knee popliteal artery to posterior tibial artery bypass using nonreversed greater saphenous vein.  Fourth metatarsal ray amputation was performed as well 3/13.  Podiatry performed I&D of the right foot on 3/15.  Transmetatarsal amputation was completed 3/17. ?Antibiotics were transitioned to [facility] to be continued for total of 2 weeks. ?His AKI reversed and at discharge creatinine was 0.7 and GFR greater than 60 indicating CKD stage II.  At admission H/H was 13.1/40.3; with the surgeries ,procedures and blood draws at discharge H/H was 8.4/26.1. ?During hospitalization glucoses range from 104 up to 257.  A1c was 7.6%.  Albumin was 3 and total protein 8.  Lipid panel revealed a total  cholesterol of 72. ?PT/OT recommended SNF placement for rehab. ? ?Past medical and surgical history: Includes OSA, PTSD, and diabetes with peripheral vascular disease and neuropathy. ?Surgeries and procedures include abdominal hernia repair, back surgery, and shoulder surgery. ? ?Social history: Presently nondrinker; former smoker. He served in [place] in [employer]. ? ?Family history: Reviewed; family history includes stroke and CAD. ?  ?Review of systems: He stated he had been in the hospital because of "bad circulation in the veins".  At home glucoses have ranged from a low of 79 which is an outlier up to a high of 230.  Typically they are above 110.  He does check both fasting and glucose pre bedtime intermittently.  He denies any peripheral neuropathic symptoms of numbness and tingling.  He became aware of the extremity issues when he saw a "flush of warm blood". ?He has been noncompliant with CPAP.  He describes occasional exertional dyspnea. ?Although generic Aricept has been prescribed,significant clinical neurocognitive deficits not suggested clinically beyond suboptimal knowledge of active medications. He receives Rxs from his PCP & specialists @ the [place].[facility] He was unaware he had been on this med (See Med List reconciliation notes).  ?He does validate anxiety and depression. He can not define why or how the diagnosis of PTSD was made. ?He describes blurred vision intermittently as well as "spots".  He also has intermittent dizziness.  He has had some pill dysphagia. ? ?Constitutional: No fever, significant weight change  ?Eyes: No redness, discharge, pain ?ENT/mouth: No nasal congestion, purulent discharge, earache, change in hearing, sore throat  ?Cardiovascular: No chest pain, palpitations, paroxysmal nocturnal dyspnea, claudication, edema  ?Respiratory: No cough, sputum production,  hemoptysis ?Gastrointestinal: No heartburn, abdominal pain, nausea /vomiting, rectal bleeding, melena, change in  bowels ?Genitourinary: No dysuria, hematuria, pyuria, incontinence, nocturia ?Musculoskeletal: No joint stiffness, joint swelling, weakness, pain ?Dermatologic: No rash, pruritus, change in appearance of skin ?Neurologic: No headache, syncope, seizures ?Psychiatric: No significant insomnia, anorexia ?Endocrine: No change in hair/nails, excessive thirst, excessive hunger, excessive urination  ?Hematologic/lymphatic: No significant bruising, lymphadenopathy, abnormal bleeding ?Allergy/immunology: No itchy/watery eyes, significant sneezing, urticaria, angioedema ? ?Physical exam:  ?Pertinent or positive findings: He has a mustache and beard.  Apparently he had just taken pain medicine as he was somnolent.  Responses were slow and his speech was slightly slurred.  The maxilla is edentulous.  The mandibular teeth are eroded to the gumline and below.  Heart sounds are distant.  Abdomen is protuberant.  The right foot is wrapped.  The right posterior tibial pulse is stronger than dorsalis pedis pulse.  The right pedal pulses are stronger than the left pedal pulses.  Eschar present over 2 elongated vertical op scars over the right lower extremity medially.  He has a tattoo over the right proximal forearm. ? ?General appearance: Adequately nourished; no acute distress, increased work of breathing is present.   ?Lymphatic: No lymphadenopathy about the head, neck, axilla. ?Eyes: No conjunctival inflammation or lid edema is present. There is no scleral icterus. ?Ears:  External ear exam shows no significant lesions or deformities.   ?Nose:  External nasal examination shows no deformity or inflammation. Nasal mucosa are pink and moist without lesions, exudates ?Oral exam: Lips and gums are healthy appearing.There is no oropharyngeal erythema or exudate. ?Neck:  No thyromegaly, masses, tenderness noted.    ?Heart:  No gallop, murmur, click, rub.  ?Lungs: Chest clear to auscultation without wheezes, rhonchi, rales, rubs. ?Abdomen:  Bowel sounds are normal.  Abdomen is soft and nontender with no organomegaly, hernias, masses. ?GU: Deferred  ?Extremities:  No cyanosis, clubbing, edema. ?Neurologic exam:  Balance, Rhomberg, finger to nose testing could not be completed due to clinical state ?Deep tendon reflexes are equal ?Skin: Warm & dry w/o tenting. ?No significant visible rash. ? ?See clinical summary under each active problem in the Problem List with associated updated therapeutic plan ? ?

## 2021-09-27 ENCOUNTER — Encounter: Payer: Self-pay | Admitting: Internal Medicine

## 2021-09-27 NOTE — Assessment & Plan Note (Signed)
Glucoses as an inpatient ranged from 104 up to 257.  This corresponds to glucose recordings at home.  A1c was 7.6%.  Optimal control would be less than 7% as long as he is not having any hypoglycemia. ?

## 2021-09-27 NOTE — Assessment & Plan Note (Signed)
3/10 - 09/22/2021 AKI present with creatinine of 2.14 and GFR 33.  With aggressive intervention creatinine was 0.7 and GFR greater than 60 at discharge indicating CKD stage II.  No change in present medications unless there is recurrence of AKI and progression of CKD ?

## 2021-09-27 NOTE — Assessment & Plan Note (Addendum)
VVS follow-up as scheduled.  Wound Care Nurse will monitor wound at the SNF. ?

## 2021-09-27 NOTE — Assessment & Plan Note (Signed)
Risk of continuing opioids long-term was discussed with him.  The major risk is his untreated OSA. ?

## 2021-09-27 NOTE — Assessment & Plan Note (Addendum)
He is on vasodilating CCB, beta-blocker, and high-dose ACE inhibitor.  If persistent systolic hypertension is identified; consideration will be given to changing his beta-blocker to carvedilol with dose titration as indicated by blood pressure average. ?

## 2021-09-27 NOTE — Patient Instructions (Signed)
See assessment and plan under each diagnosis in the problem list and acutely for this visit 

## 2021-09-27 NOTE — Assessment & Plan Note (Signed)
Risk of the untreated OSA such as A-fib and embolic stroke discussed with him.  Compliance urged based on these risks. ?

## 2021-09-29 ENCOUNTER — Non-Acute Institutional Stay (SKILLED_NURSING_FACILITY): Payer: Medicare HMO | Admitting: Adult Health

## 2021-09-29 ENCOUNTER — Encounter: Payer: Self-pay | Admitting: Adult Health

## 2021-09-29 DIAGNOSIS — E1142 Type 2 diabetes mellitus with diabetic polyneuropathy: Secondary | ICD-10-CM | POA: Diagnosis not present

## 2021-09-29 DIAGNOSIS — R1084 Generalized abdominal pain: Secondary | ICD-10-CM

## 2021-09-29 DIAGNOSIS — G8928 Other chronic postprocedural pain: Secondary | ICD-10-CM

## 2021-09-29 DIAGNOSIS — E11628 Type 2 diabetes mellitus with other skin complications: Secondary | ICD-10-CM

## 2021-09-29 DIAGNOSIS — L03119 Cellulitis of unspecified part of limb: Secondary | ICD-10-CM

## 2021-09-29 MED ORDER — OXYCODONE HCL 5 MG PO TABS: 10.0000 mg | ORAL_TABLET | Freq: Four times a day (QID) | ORAL | 0 refills | Status: DC | PRN

## 2021-09-29 NOTE — Progress Notes (Signed)
? ?Location:  [facility] ?Nursing Home Room Number: 284-X ?Place of Service:  SNF (31) ?Provider:  Durenda Age, DNP, FNP-BC ? ?Patient Care Team: ?Harvie Junior, MD as PCP - General (Family Medicine) ?Lavonna Monarch, MD as Consulting Physician (Dermatology) ? ?Extended Emergency Contact Information ?Primary Emergency Contact: Ogburn,[name] ?Mobile Phone: [phone] ?Relation: Significant other ? ?Code Status:  Full Code ? ?Goals of care: Advanced Directive information ? ?  09/29/2021  ? 11:04 AM  ?Advanced Directives  ?Does Patient Have a Medical Advance Directive? No  ?Would patient like information on creating a medical advance directive? No - Patient declined  ? ? ? ?Chief Complaint  ?Patient presents with  ? Acute Visit  ?  Short term rehab  ? ? ?HPI:  ?Pt is a 66 y.o. male seen today for short-term rehabilitation. He is currently having PT and OT at [place]. CBGs ranging from 77 to 298, with outlier 430. He takes Novolog Mix 70-30 flexpen 80 units BID, Metformin 1,000 mg BID, Glipizide 5 mg 1 tab BID, Ozempic 1 mg/dose inject 1 mg SQ once a week and Jardiance 12.5 mg daily. He complained of abdominal pain. He had bowel movement yesterday. He takes Aricept but denies having dementia. ? ? ?Past Medical History:  ?Diagnosis Date  ? Diabetes mellitus with peripheral vascular disease   ? Hypertension   ? Neuropathy   ? PTSD (post-traumatic stress disorder)   ? Sleep apnea   ? ?Past Surgical History:  ?Procedure Laterality Date  ? ABDOMINAL AORTOGRAM W/LOWER EXTREMITY Right 09/10/2021  ? Procedure: ABDOMINAL AORTOGRAM W/LOWER EXTREMITY;  Surgeon: [name], MD;  Location: [place] [facility];  Service: Cardiovascular;  Laterality: Right;  ? ABDOMINAL HERNIA REPAIR    ? AMPUTATION Right 09/19/2021  ? Procedure: TRANSMETATARSAL AMPUTATION FOOT;  Surgeon: Lorenda Peck, MD;  Location: [place];  Service: Podiatry;  Laterality: Right;  Surgical team to do local block   ? AMPUTATION TOE Right 09/15/2021  ? Procedure: AMPUTATION 4th TOE;  Surgeon: [name], MD;  Location: [place];  Service: Vascular;  Laterality: Right;  ? BACK SURGERY    ? FEMORAL-TIBIAL BYPASS GRAFT Right 09/15/2021  ? Procedure: BYPASS GRAFT BELOW KNEE POPLITEAL ARTERY TO POSTERIOR TIBIAL ARTERY;  Surgeon: [name], MD;  Location: [place];  Service: Vascular;  Laterality: Right;  ? IRRIGATION AND DEBRIDEMENT FOOT Right 09/17/2021  ? Procedure: IRRIGATION AND DEBRIDEMENT FOOT;  Surgeon: Lorenda Peck, MD;  Location: [place];  Service: Podiatry;  Laterality: Right;  Suregeon will do anesthesia block  ? KNEE SURGERY    ? PERIPHERAL VASCULAR BALLOON ANGIOPLASTY  09/10/2021  ? Procedure: PERIPHERAL VASCULAR BALLOON ANGIOPLASTY;  Surgeon: [name], MD;  Location: [place] [facility];  Service: Cardiovascular;;  rt sfa pta  ? SHOULDER SURGERY    ? VEIN HARVEST Right 09/15/2021  ? Procedure: GREATER SAPHENOUS VEIN HARVEST;  Surgeon: [name], MD;  Location: [place];  Service: Vascular;  Laterality: Right;  ? ? ?Allergies  ?Allergen Reactions  ? Gemfibrozil Other (See Comments)  ?  Doesn't know its been awhile  ? Oxycontin [Oxycodone Hcl] Other (See Comments)  ?  Bad reaction "took me to a place I never wanna go again"  ? Pork-Derived Products   ? Simvastatin Other (See Comments)  ?  Says he doesn't know its been awhile  ? ? ?Outpatient Encounter Medications as of 09/29/2021  ?Medication Sig  ? albuterol (VENTOLIN HFA) 108 (90 Base)  MCG/ACT inhaler Inhale 2 puffs into the lungs every 6 (six) hours as needed for wheezing or shortness of breath.  ? amLODipine (NORVASC) 5 MG tablet Take 5 mg by mouth every morning.  ? aspirin EC 81 MG tablet Take 1 tablet (81 mg total) by mouth daily. Swallow whole.  ? atorvastatin (LIPITOR) 80 MG tablet Take 80 mg by mouth at bedtime.  ? bisacodyl (DULCOLAX) 10 MG suppository If not relieved by MOM, give 10 mg Bisacodyl suppositiory rectally X 1 dose in 24 hours as needed   ? buPROPion (WELLBUTRIN XL) 300 MG 24 hr tablet Take 300 mg by mouth every morning.  ? carboxymethylcellulose (REFRESH PLUS) 0.5 % SOLN Place 1 drop into both eyes 4 (four) times daily as needed (dry eyes).  ? cefadroxil (DURICEF) 500 MG capsule Take 1 capsule (500 mg total) by mouth 2 (two) times daily for 13 days.  ? cholecalciferol (VITAMIN D) 25 MCG (1000 UNIT) tablet Take 1,000 Units by mouth daily.  ? clopidogrel (PLAVIX) 75 MG tablet Take 1 tablet (75 mg total) by mouth daily.  ? diclofenac (VOLTAREN) 75 MG EC tablet Take 75 mg by mouth 2 (two) times daily.  ? donepezil (ARICEPT) 5 MG tablet Take 5 mg by mouth daily.  ? empagliflozin (JARDIANCE) 25 MG TABS tablet Take 12.5 mg by mouth daily.  ? fluticasone furoate-vilanterol (BREO ELLIPTA) 100-25 MCG/INH AEPB Inhale 1 puff into the lungs daily.  ? gabapentin (NEURONTIN) 300 MG capsule Take 300 mg by mouth 2 (two) times daily.  ? gabapentin (NEURONTIN) 600 MG tablet Take 600 mg by mouth 2 (two) times daily as needed (when 300 mg capsules are not available).  ? glipiZIDE (GLUCOTROL) 5 MG tablet Take 5 mg by mouth in the morning and at bedtime.  ? insulin aspart protamine- aspart (NOVOLOG MIX 70/30) (70-30) 100 UNIT/ML injection Inject 80 Units into the skin 2 (two) times daily with a meal.  ? lisinopril (ZESTRIL) 20 MG tablet Take 1 tablet (20 mg total) by mouth daily.  ? lurasidone (LATUDA) 40 MG TABS tablet Take 40 mg by mouth daily with supper.  ? Magnesium Hydroxide (MILK OF MAGNESIA PO) If no BM in 3 days, give 30 cc Milk of Magnesium p.o. x 1 dose in 24 hours as needed (Do not use standing constipation orders for residents with renal failure CFR less than 30. Contact MD for orders)  ? meclizine (ANTIVERT) 25 MG tablet Take 1 tablet (25 mg total) by mouth 3 (three) times daily as needed for dizziness.  ? metFORMIN (GLUCOPHAGE) 1000 MG tablet Take 1,000 mg by mouth 2 (two) times daily with a meal.  ? metoprolol tartrate (LOPRESSOR) 50 MG tablet Take 50 mg  by mouth 2 (two) times daily.  ? NON FORMULARY Heart healthy LCS diet  ? OLANZapine (ZYPREXA) 2.5 MG tablet Take 2.5 mg by mouth at bedtime.  ? omeprazole (PRILOSEC) 20 MG capsule Take 20 mg by mouth at bedtime.  ? OXcarbazepine (TRILEPTAL) 150 MG tablet Take 150 mg by mouth 2 (two) times daily.  ? oxyCODONE (ROXICODONE) 15 MG immediate release tablet Take 1 tablet (15 mg total) by mouth every 6 (six) hours as needed for pain.  ? Semaglutide, 1 MG/DOSE, (OZEMPIC, 1 MG/DOSE,) 2 MG/1.5ML SOPN Inject 1 mg into the skin every Sunday.  ? Sodium Phosphates (RA SALINE ENEMA RE) If not relieved by Biscodyl suppository, give disposable Saline Enema rectally X 1 dose/24 hrs as needed  ? tadalafil (CIALIS) 20 MG tablet Take  10 mg by mouth daily as needed for erectile dysfunction.  ? [DISCONTINUED] GLIPIZIDE PO Take 1 tablet by mouth 2 (two) times daily. (Patient not taking: Reported on 09/17/2021)  ? ?No facility-administered encounter medications on file as of 09/29/2021.  ? ? ?Review of Systems  ?Constitutional:  Negative for activity change, appetite change and fever.  ?HENT:  Negative for sore throat.   ?Eyes: Negative.   ?Cardiovascular:  Negative for chest pain and leg swelling.  ?Gastrointestinal:  Positive for abdominal pain. Negative for abdominal distention, diarrhea and vomiting.  ?Genitourinary:  Negative for dysuria, frequency and urgency.  ?Skin:  Positive for wound. Negative for color change.  ?Neurological:  Negative for dizziness and headaches.  ?Psychiatric/Behavioral:  Negative for behavioral problems and sleep disturbance. The patient is not nervous/anxious.    ? ? ? ? ?There is no immunization history on file for this patient. ?Pertinent  Health Maintenance Due  ?Topic Date Due  ? FOOT EXAM  Never done  ? OPHTHALMOLOGY EXAM  Never done  ? COLONOSCOPY (Pts 45-9yr Insurance coverage will need to be confirmed)  Never done  ? INFLUENZA VACCINE  Never done  ? HEMOGLOBIN A1C  03/16/2022  ? ? ?  09/19/2021  ?   9:05 AM 09/20/2021  ?  4:53 PM 09/21/2021  ?  6:06 PM 09/21/2021  ?  8:00 PM 09/22/2021  ?  9:11 AM  ?Fall Risk  ?Patient Fall Risk Level High fall risk High fall risk High fall risk High fall risk High fall

## 2021-10-01 ENCOUNTER — Ambulatory Visit (INDEPENDENT_AMBULATORY_CARE_PROVIDER_SITE_OTHER): Payer: Medicare HMO | Admitting: Podiatry

## 2021-10-01 ENCOUNTER — Encounter: Payer: Self-pay | Admitting: Podiatry

## 2021-10-01 ENCOUNTER — Other Ambulatory Visit: Payer: Self-pay

## 2021-10-01 ENCOUNTER — Ambulatory Visit (INDEPENDENT_AMBULATORY_CARE_PROVIDER_SITE_OTHER): Payer: Medicare HMO

## 2021-10-01 DIAGNOSIS — L03115 Cellulitis of right lower limb: Secondary | ICD-10-CM

## 2021-10-01 DIAGNOSIS — Z9889 Other specified postprocedural states: Secondary | ICD-10-CM

## 2021-10-01 DIAGNOSIS — E1142 Type 2 diabetes mellitus with diabetic polyneuropathy: Secondary | ICD-10-CM

## 2021-10-01 NOTE — Progress Notes (Signed)
?Subjective:  ?Patient ID: [name], male    DOB: 25-Dec-1955,  MRN: [id number] ? ?Chief Complaint  ?Patient presents with  ? Wound Check  ?  s/p Right TMA  ? ? ?DOS: 09/19/21 ?Procedure: Right transmetatarsal amputation and right foot I&D ? ?66 y.o. male returns for POV#1. Doing well and having dressing changed daily at the facility. Relates pain is well controlled. Still unable to look at his foot.  ? ?Review of Systems: Negative except as noted in the HPI. Denies N/V/F/Ch. ? ?Past Medical History:  ?Diagnosis Date  ? Diabetes mellitus with peripheral vascular disease   ? Hypertension   ? Neuropathy   ? PTSD (post-traumatic stress disorder)   ? Sleep apnea   ? ? ?Current Outpatient Medications:  ?  albuterol (VENTOLIN HFA) 108 (90 Base) MCG/ACT inhaler, Inhale 2 puffs into the lungs every 6 (six) hours as needed for wheezing or shortness of breath., Disp: 1 each, Rfl: 5 ?  amLODipine (NORVASC) 5 MG tablet, Take 5 mg by mouth every morning., Disp: , Rfl:  ?  aspirin EC 81 MG tablet, Take 1 tablet (81 mg total) by mouth daily. Swallow whole., Disp: 150 tablet, Rfl: 2 ?  atorvastatin (LIPITOR) 80 MG tablet, Take 80 mg by mouth at bedtime., Disp: , Rfl:  ?  bisacodyl (DULCOLAX) 10 MG suppository, If not relieved by MOM, give 10 mg Bisacodyl suppositiory rectally X 1 dose in 24 hours as needed, Disp: , Rfl:  ?  buPROPion (WELLBUTRIN XL) 300 MG 24 hr tablet, Take 300 mg by mouth every morning., Disp: , Rfl:  ?  carboxymethylcellulose (REFRESH PLUS) 0.5 % SOLN, Place 1 drop into both eyes 4 (four) times daily as needed (dry eyes)., Disp: , Rfl:  ?  cefadroxil (DURICEF) 500 MG capsule, Take 1 capsule (500 mg total) by mouth 2 (two) times daily for 13 days., Disp: 26 capsule, Rfl: 0 ?  cholecalciferol (VITAMIN D) 25 MCG (1000 UNIT) tablet, Take 1,000 Units by mouth daily., Disp: , Rfl:  ?  clopidogrel (PLAVIX) 75 MG tablet, Take 1 tablet (75 mg total) by mouth daily., Disp: 360 tablet, Rfl: 0 ?  diclofenac (VOLTAREN)  75 MG EC tablet, Take 75 mg by mouth 2 (two) times daily., Disp: , Rfl:  ?  empagliflozin (JARDIANCE) 25 MG TABS tablet, Take 25 mg by mouth daily., Disp: , Rfl:  ?  fluticasone furoate-vilanterol (BREO ELLIPTA) 100-25 MCG/INH AEPB, Inhale 1 puff into the lungs daily., Disp: 60 each, Rfl: 5 ?  gabapentin (NEURONTIN) 300 MG capsule, Take 300 mg by mouth 2 (two) times daily., Disp: , Rfl:  ?  gabapentin (NEURONTIN) 600 MG tablet, Take 600 mg by mouth 2 (two) times daily as needed (when 300 mg capsules are not available)., Disp: , Rfl:  ?  insulin aspart protamine- aspart (NOVOLOG MIX 70/30) (70-30) 100 UNIT/ML injection, Inject 80 Units into the skin 2 (two) times daily with a meal., Disp: , Rfl:  ?  lisinopril (ZESTRIL) 20 MG tablet, Take 1 tablet (20 mg total) by mouth daily., Disp: 30 tablet, Rfl: 0 ?  lurasidone (LATUDA) 40 MG TABS tablet, Take 40 mg by mouth daily with supper., Disp: , Rfl:  ?  Magnesium Hydroxide (MILK OF MAGNESIA PO), If no BM in 3 days, give 30 cc Milk of Magnesium p.o. x 1 dose in 24 hours as needed (Do not use standing constipation orders for residents with renal failure CFR less than 30. Contact MD for orders), Disp: ,  Rfl:  ?  meclizine (ANTIVERT) 25 MG tablet, Take 1 tablet (25 mg total) by mouth 3 (three) times daily as needed for dizziness., Disp: 30 tablet, Rfl: 0 ?  metFORMIN (GLUCOPHAGE) 1000 MG tablet, Take 1,000 mg by mouth 2 (two) times daily with a meal., Disp: , Rfl:  ?  metoprolol tartrate (LOPRESSOR) 50 MG tablet, Take 50 mg by mouth 2 (two) times daily., Disp: , Rfl:  ?  NON FORMULARY, Heart healthy LCS diet, Disp: , Rfl:  ?  OLANZapine (ZYPREXA) 2.5 MG tablet, Take 2.5 mg by mouth at bedtime., Disp: , Rfl:  ?  omeprazole (PRILOSEC) 20 MG capsule, Take 20 mg by mouth at bedtime., Disp: , Rfl:  ?  OXcarbazepine (TRILEPTAL) 150 MG tablet, Take 150 mg by mouth 2 (two) times daily., Disp: , Rfl:  ?  oxyCODONE (OXY IR/ROXICODONE) 5 MG immediate release tablet, Take 2 tablets (10  mg total) by mouth every 6 (six) hours as needed for severe pain., Disp: 60 tablet, Rfl: 0 ?  Semaglutide, 1 MG/DOSE, (OZEMPIC, 1 MG/DOSE,) 2 MG/1.5ML SOPN, Inject 1 mg into the skin every Sunday., Disp: , Rfl:  ?  Sodium Phosphates (RA SALINE ENEMA RE), If not relieved by Biscodyl suppository, give disposable Saline Enema rectally X 1 dose/24 hrs as needed, Disp: , Rfl:  ?  tadalafil (CIALIS) 20 MG tablet, Take 10 mg by mouth daily as needed for erectile dysfunction., Disp: , Rfl:  ? ?Social History  ? ?Tobacco Use  ?Smoking Status Former  ? Types: Cigarettes  ?Smokeless Tobacco Never  ? ? ?Allergies  ?Allergen Reactions  ? Gemfibrozil Other (See Comments)  ?  Doesn't know its been awhile  ? Oxycontin [Oxycodone Hcl] Other (See Comments)  ?  Bad reaction "took me to a place I never wanna go again"  ? Pork-Derived Products   ? Simvastatin Other (See Comments)  ?  Says he doesn't know its been awhile  ? ?Objective:  ?There were no vitals filed for this visit. ?There is no height or weight on file to calculate BMI. ?Constitutional Well developed. ?Well nourished.  ?Vascular Foot warm and well perfused. ?Capillary refill normal to all digits.   ?Neurologic Normal speech. ?Oriented to person, place, and time. ?Epicritic sensation to light touch grossly present bilaterally.  ?Dermatologic Skin incisions well without signs of infection. Skin edges well coapted without signs of infection. Large dorsal foot wound with noted improvement of granular tissue starting to cover metatarsals some necrotic tissue noted to the edges of wound. Proixmally and distally.    ?Orthopedic: Tenderness to palpation noted about the surgical site.  ? ?Radiographs: interval loss of distal forefoot with resection at transmetatarsal level.  ?Assessment:  ? ?1. Post-operative state   ?2. Type 2 diabetes mellitus with peripheral neuropathy (HCC)   ? ?Plan:  ?Patient was evaluated and treated and all questions answered. ? ?S/p foot surgery  right ?-Progressing as expected post-operatively. ?-WB Status: Heel WBAT in surgical shoe ?-Sutures: intact. ?-Continue with daily WTD dressing changes.  ?Debridement of wound preformed today to healthy bleeding tissue.  ?-Medications: continue course of cefadroxil ?-Foot redressed. ?-Will refer to wound care to aid with dorsal wound closure.  ?Discussed if any worsening pain swelling, redness, fever nausea or vomiting to report to the emergency room.  ?Return in 2 weeks for suture removal.  ? ?Return in about 2 weeks (around 10/15/2021) for post op.  ? ?

## 2021-10-06 ENCOUNTER — Encounter: Payer: Self-pay | Admitting: Adult Health

## 2021-10-06 ENCOUNTER — Encounter: Payer: Self-pay | Admitting: Vascular Surgery

## 2021-10-06 ENCOUNTER — Non-Acute Institutional Stay (SKILLED_NURSING_FACILITY): Payer: Medicare HMO | Admitting: Adult Health

## 2021-10-06 ENCOUNTER — Ambulatory Visit (INDEPENDENT_AMBULATORY_CARE_PROVIDER_SITE_OTHER): Payer: Medicare HMO | Admitting: Vascular Surgery

## 2021-10-06 VITALS — BP 134/69 | HR 80 | Temp 98.1°F | Resp 16 | Ht 72.0 in | Wt 218.0 lb

## 2021-10-06 DIAGNOSIS — E162 Hypoglycemia, unspecified: Secondary | ICD-10-CM

## 2021-10-06 DIAGNOSIS — E1142 Type 2 diabetes mellitus with diabetic polyneuropathy: Secondary | ICD-10-CM | POA: Diagnosis not present

## 2021-10-06 DIAGNOSIS — I10 Essential (primary) hypertension: Secondary | ICD-10-CM

## 2021-10-06 DIAGNOSIS — R197 Diarrhea, unspecified: Secondary | ICD-10-CM

## 2021-10-06 DIAGNOSIS — Z9889 Other specified postprocedural states: Secondary | ICD-10-CM

## 2021-10-06 NOTE — Progress Notes (Signed)
?Office Note  ? ? ? ? ?HPI: [name] is a 66 y.o. (21-Jun-1956) male presenting short-term follow-up status post 09/15/21 right below-knee popliteal artery to posterior tibial artery bypass using nonreversed greater saphenous vein for Rutherford 5 critical limb ischemia.  Patient underwent TMA by podiatry due to a nonsalvageable forefoot from diabetic foot infection. ? ?On exam today,  Red was doing well.  He denied drainage from his right lower extremity incisions.  He is heel weightbearing on his TMA, which is open, with wet-to-dry dressings daily.  Pain is well controlled.  Jalin stated his foot was healing and asked that I not remove the bandages ? ?The pt is  on a statin for cholesterol management.  ?The pt is  on a daily aspirin.   Other AC:  plavix ?The pt is  on medication for hypertension.   ?The pt is  diabetic.  ?Tobacco hx:  - ? ?Past Medical History:  ?Diagnosis Date  ? Diabetes mellitus with peripheral vascular disease   ? Hypertension   ? Neuropathy   ? PTSD (post-traumatic stress disorder)   ? Sleep apnea   ? ? ?Past Surgical History:  ?Procedure Laterality Date  ? ABDOMINAL AORTOGRAM W/LOWER EXTREMITY Right 09/10/2021  ? Procedure: ABDOMINAL AORTOGRAM W/LOWER EXTREMITY;  Surgeon: [name], MD;  Location: [facility];  Service: Cardiovascular;  Laterality: Right;  ? ABDOMINAL HERNIA REPAIR    ? AMPUTATION Right 09/19/2021  ? Procedure: TRANSMETATARSAL AMPUTATION FOOT;  Surgeon: Lorenda Peck, MD;  Location: [facility];  Service: Podiatry;  Laterality: Right;  Surgical team to do local block  ? AMPUTATION TOE Right 09/15/2021  ? Procedure: AMPUTATION 4th TOE;  Surgeon: [name], MD;  Location: [facility];  Service: Vascular;  Laterality: Right;  ? BACK SURGERY    ? FEMORAL-TIBIAL BYPASS GRAFT Right 09/15/2021  ? Procedure: BYPASS GRAFT BELOW KNEE POPLITEAL ARTERY TO POSTERIOR TIBIAL ARTERY;  Surgeon: [name], MD;  Location: [facility];  Service: Vascular;  Laterality: Right;  ?  IRRIGATION AND DEBRIDEMENT FOOT Right 09/17/2021  ? Procedure: IRRIGATION AND DEBRIDEMENT FOOT;  Surgeon: Lorenda Peck, MD;  Location: [facility];  Service: Podiatry;  Laterality: Right;  Suregeon will do anesthesia block  ? KNEE SURGERY    ? PERIPHERAL VASCULAR BALLOON ANGIOPLASTY  09/10/2021  ? Procedure: PERIPHERAL VASCULAR BALLOON ANGIOPLASTY;  Surgeon: [name], MD;  Location: [facility];  Service: Cardiovascular;;  rt sfa pta  ? SHOULDER SURGERY    ? VEIN HARVEST Right 09/15/2021  ? Procedure: GREATER SAPHENOUS VEIN HARVEST;  Surgeon: [name], MD;  Location: [facility];  Service: Vascular;  Laterality: Right;  ? ? ?Social History  ? ?Socioeconomic History  ? Marital status: Married  ?  Spouse name: Not on file  ? Number of children: Not on file  ? Years of education: Not on file  ? Highest education level: Not on file  ?Occupational History  ? Not on file  ?Tobacco Use  ? Smoking status: Former  ?  Types: Cigarettes  ? Smokeless tobacco: Never  ?Vaping Use  ? Vaping Use: Never used  ?Substance and Sexual Activity  ? Alcohol use: No  ? Drug use: Never  ? Sexual activity: Not on file  ?Other Topics Concern  ? Not on file  ?Social History Narrative  ? Not on file  ? ?Social Determinants of Health  ? ?Financial Resource Strain: Not on file  ?Food Insecurity: Not on file  ?  Transportation Needs: Not on file  ?Physical Activity: Not on file  ?Stress: Not on file  ?Social Connections: Not on file  ?Intimate Partner Violence: Not on file  ? ?Family History  ?Problem Relation Age of Onset  ? Stroke Mother   ? Coronary artery disease Father   ? COPD Brother   ? ? ?Current Outpatient Medications  ?Medication Sig Dispense Refill  ? albuterol (VENTOLIN HFA) 108 (90 Base) MCG/ACT inhaler Inhale 2 puffs into the lungs every 6 (six) hours as needed for wheezing or shortness of breath. 1 each 5  ? amLODipine (NORVASC) 5 MG tablet Take 5 mg by mouth every morning.    ? aspirin EC 81 MG tablet Take 1 tablet (81 mg  total) by mouth daily. Swallow whole. 150 tablet 2  ? atorvastatin (LIPITOR) 80 MG tablet Take 80 mg by mouth at bedtime.    ? bisacodyl (DULCOLAX) 10 MG suppository If not relieved by MOM, give 10 mg Bisacodyl suppositiory rectally X 1 dose in 24 hours as needed    ? buPROPion (WELLBUTRIN XL) 300 MG 24 hr tablet Take 300 mg by mouth every morning.    ? carboxymethylcellulose (REFRESH PLUS) 0.5 % SOLN Place 1 drop into both eyes 4 (four) times daily as needed (dry eyes).    ? cholecalciferol (VITAMIN D) 25 MCG (1000 UNIT) tablet Take 1,000 Units by mouth daily.    ? clopidogrel (PLAVIX) 75 MG tablet Take 1 tablet (75 mg total) by mouth daily. 360 tablet 0  ? diclofenac (VOLTAREN) 75 MG EC tablet Take 75 mg by mouth 2 (two) times daily.    ? empagliflozin (JARDIANCE) 25 MG TABS tablet Take 25 mg by mouth daily.    ? fluticasone furoate-vilanterol (BREO ELLIPTA) 100-25 MCG/INH AEPB Inhale 1 puff into the lungs daily. 60 each 5  ? gabapentin (NEURONTIN) 300 MG capsule Take 300 mg by mouth 2 (two) times daily.    ? gabapentin (NEURONTIN) 600 MG tablet Take 600 mg by mouth 2 (two) times daily as needed (when 300 mg capsules are not available).    ? insulin aspart protamine- aspart (NOVOLOG MIX 70/30) (70-30) 100 UNIT/ML injection Inject 80 Units into the skin 2 (two) times daily with a meal.    ? lisinopril (ZESTRIL) 20 MG tablet Take 1 tablet (20 mg total) by mouth daily. 30 tablet 0  ? lurasidone (LATUDA) 40 MG TABS tablet Take 40 mg by mouth daily with supper.    ? Magnesium Hydroxide (MILK OF MAGNESIA PO) If no BM in 3 days, give 30 cc Milk of Magnesium p.o. x 1 dose in 24 hours as needed (Do not use standing constipation orders for residents with renal failure CFR less than 30. Contact MD for orders)    ? meclizine (ANTIVERT) 25 MG tablet Take 1 tablet (25 mg total) by mouth 3 (three) times daily as needed for dizziness. 30 tablet 0  ? metFORMIN (GLUCOPHAGE) 1000 MG tablet Take 1,000 mg by mouth 2 (two) times daily  with a meal.    ? metoprolol tartrate (LOPRESSOR) 50 MG tablet Take 50 mg by mouth 2 (two) times daily.    ? NON FORMULARY Heart healthy LCS diet    ? OLANZapine (ZYPREXA) 2.5 MG tablet Take 2.5 mg by mouth at bedtime.    ? omeprazole (PRILOSEC) 20 MG capsule Take 20 mg by mouth at bedtime.    ? OXcarbazepine (TRILEPTAL) 150 MG tablet Take 150 mg by mouth 2 (two) times daily.    ?  oxyCODONE (OXY IR/ROXICODONE) 5 MG immediate release tablet Take 2 tablets (10 mg total) by mouth every 6 (six) hours as needed for severe pain. 60 tablet 0  ? Semaglutide, 1 MG/DOSE, (OZEMPIC, 1 MG/DOSE,) 2 MG/1.5ML SOPN Inject 1 mg into the skin every Sunday.    ? Sodium Phosphates (RA SALINE ENEMA RE) If not relieved by Biscodyl suppository, give disposable Saline Enema rectally X 1 dose/24 hrs as needed    ? tadalafil (CIALIS) 20 MG tablet Take 10 mg by mouth daily as needed for erectile dysfunction.    ? ?No current facility-administered medications for this visit.  ? ? ?Allergies  ?Allergen Reactions  ? Gemfibrozil Other (See Comments)  ?  Doesn't know its been awhile  ? Oxycontin [Oxycodone Hcl] Other (See Comments)  ?  Bad reaction "took me to a place I never wanna go again"  ? Pork-Derived Products   ? Simvastatin Other (See Comments)  ?  Says he doesn't know its been awhile  ? ? ? ?REVIEW OF SYSTEMS:  ? ?'[X]'$  denotes positive finding, '[ ]'$  denotes negative finding ?Cardiac  Comments:  ?Chest pain or chest pressure:    ?Shortness of breath upon exertion:    ?Short of breath when lying flat:    ?Irregular heart rhythm:    ?    ?Vascular    ?Pain in calf, thigh, or hip brought on by ambulation:    ?Pain in feet at night that wakes you up from your sleep:     ?Blood clot in your veins:    ?Leg swelling:     ?    ?Pulmonary    ?Oxygen at home:    ?Productive cough:     ?Wheezing:     ?    ?Neurologic    ?Sudden weakness in arms or legs:     ?Sudden numbness in arms or legs:     ?Sudden onset of difficulty speaking or slurred speech:     ?Temporary loss of vision in one eye:     ?Problems with dizziness:     ?    ?Gastrointestinal    ?Blood in stool:     ?Vomited blood:     ?    ?Genitourinary    ?Burning when urinating:     ?Blood in

## 2021-10-06 NOTE — Progress Notes (Signed)
? ?Location:  [facility] ?Nursing Home Room Number: 811-X ?Place of Service:  SNF (31) ?Provider:  Durenda Age, DNP, FNP-BC ? ?Patient Care Team: ?Harvie Junior, MD as PCP - General (Family Medicine) ?Lavonna Monarch, MD as Consulting Physician (Dermatology) ? ?Extended Emergency Contact Information ?Primary Emergency Contact: Ogburn,[name] ?Mobile Phone: [phone] ?Relation: Significant other ? ?Code Status:  Full Code ? ?Goals of care: Advanced Directive information ? ?  10/06/2021  ?  2:54 PM  ?Advanced Directives  ?Does Patient Have a Medical Advance Directive? No  ?Would patient like information on creating a medical advance directive? No - Patient declined  ? ? ? ?Chief Complaint  ?Patient presents with  ? Acute Visit  ?  Short term rehab  ? ? ?HPI:  ?Pt is a 66 y.o. male seen today for short-term rehabilitation. He is currently having PT, OT and ST. ? ?Hypoglycemia  -  CBGs ranging from 55 to 280. He is currently taking NovoLog Mix 70-3318 units SQ twice a day, metformin 1000 mg 1 tab twice a day, Jardiance 25 mg 1 tab daily ? ?Diarrhea, unspecified type  -complained of having watery stools 2 times ? ?Type 2 diabetes mellitus with peripheral neuropathy (HCC) ? ?Essential hypertension  -  SBPs ranging from 119-154, takes metoprolol tartrate 50 mg 1 tab twice a day, lisinopril 20 mg 1 tab daily and amlodipine 5 mg 1 tab daily ? ? ? ?Past Medical History:  ?Diagnosis Date  ? Diabetes mellitus with peripheral vascular disease   ? Hypertension   ? Neuropathy   ? PTSD (post-traumatic stress disorder)   ? Sleep apnea   ? ?Past Surgical History:  ?Procedure Laterality Date  ? ABDOMINAL AORTOGRAM W/LOWER EXTREMITY Right 09/10/2021  ? Procedure: ABDOMINAL AORTOGRAM W/LOWER EXTREMITY;  Surgeon: [name], MD;  Location: [facility];  Service: Cardiovascular;  Laterality: Right;  ? ABDOMINAL HERNIA REPAIR    ? AMPUTATION Right 09/19/2021  ? Procedure: TRANSMETATARSAL AMPUTATION FOOT;   Surgeon: Lorenda Peck, MD;  Location: [facility];  Service: Podiatry;  Laterality: Right;  Surgical team to do local block  ? AMPUTATION TOE Right 09/15/2021  ? Procedure: AMPUTATION 4th TOE;  Surgeon: [name], MD;  Location: [facility];  Service: Vascular;  Laterality: Right;  ? BACK SURGERY    ? FEMORAL-TIBIAL BYPASS GRAFT Right 09/15/2021  ? Procedure: BYPASS GRAFT BELOW KNEE POPLITEAL ARTERY TO POSTERIOR TIBIAL ARTERY;  Surgeon: [name], MD;  Location: [facility];  Service: Vascular;  Laterality: Right;  ? IRRIGATION AND DEBRIDEMENT FOOT Right 09/17/2021  ? Procedure: IRRIGATION AND DEBRIDEMENT FOOT;  Surgeon: Lorenda Peck, MD;  Location: [facility];  Service: Podiatry;  Laterality: Right;  Suregeon will do anesthesia block  ? KNEE SURGERY    ? PERIPHERAL VASCULAR BALLOON ANGIOPLASTY  09/10/2021  ? Procedure: PERIPHERAL VASCULAR BALLOON ANGIOPLASTY;  Surgeon: [name], MD;  Location: [facility];  Service: Cardiovascular;;  rt sfa pta  ? SHOULDER SURGERY    ? VEIN HARVEST Right 09/15/2021  ? Procedure: GREATER SAPHENOUS VEIN HARVEST;  Surgeon: [name], MD;  Location: [facility];  Service: Vascular;  Laterality: Right;  ? ? ?Allergies  ?Allergen Reactions  ? Gemfibrozil Other (See Comments)  ?  Doesn't know its been awhile  ? Oxycontin [Oxycodone Hcl] Other (See Comments)  ?  Bad reaction "took me to a place I never wanna go again"  ? Pork-Derived Products   ? Simvastatin Other (See Comments)  ?  Says he doesn't know its been awhile  ? ? ?Outpatient Encounter Medications as of 10/06/2021  ?Medication Sig  ? albuterol (VENTOLIN HFA) 108 (90 Base) MCG/ACT inhaler Inhale 2 puffs into the lungs every 6 (six) hours as needed for wheezing or shortness of breath.  ? amLODipine (NORVASC) 5 MG tablet Take 5 mg by mouth every morning.  ? aspirin EC 81 MG tablet Take 1 tablet (81 mg total) by mouth daily. Swallow whole.  ? atorvastatin (LIPITOR) 80 MG tablet Take 80 mg by mouth at bedtime.  ? bisacodyl  (DULCOLAX) 10 MG suppository If not relieved by MOM, give 10 mg Bisacodyl suppositiory rectally X 1 dose in 24 hours as needed  ? buPROPion (WELLBUTRIN XL) 300 MG 24 hr tablet Take 300 mg by mouth every morning.  ? carboxymethylcellulose (REFRESH PLUS) 0.5 % SOLN Place 1 drop into both eyes 4 (four) times daily as needed (dry eyes).  ? cholecalciferol (VITAMIN D) 25 MCG (1000 UNIT) tablet Take 1,000 Units by mouth daily.  ? clopidogrel (PLAVIX) 75 MG tablet Take 1 tablet (75 mg total) by mouth daily.  ? empagliflozin (JARDIANCE) 25 MG TABS tablet Take 25 mg by mouth daily.  ? fluticasone furoate-vilanterol (BREO ELLIPTA) 100-25 MCG/INH AEPB Inhale 1 puff into the lungs daily.  ? gabapentin (NEURONTIN) 300 MG capsule Take 300 mg by mouth 2 (two) times daily.  ? gabapentin (NEURONTIN) 600 MG tablet Take 600 mg by mouth 2 (two) times daily as needed (when 300 mg capsules are not available).  ? insulin aspart protamine- aspart (NOVOLOG MIX 70/30) (70-30) 100 UNIT/ML injection Inject 80 Units into the skin 2 (two) times daily with a meal.  ? lisinopril (ZESTRIL) 20 MG tablet Take 1 tablet (20 mg total) by mouth daily.  ? lurasidone (LATUDA) 40 MG TABS tablet Take 40 mg by mouth daily with supper.  ? Magnesium Hydroxide (MILK OF MAGNESIA PO) If no BM in 3 days, give 30 cc Milk of Magnesium p.o. x 1 dose in 24 hours as needed (Do not use standing constipation orders for residents with renal failure CFR less than 30. Contact MD for orders)  ? meclizine (ANTIVERT) 25 MG tablet Take 1 tablet (25 mg total) by mouth 3 (three) times daily as needed for dizziness.  ? metFORMIN (GLUCOPHAGE) 1000 MG tablet Take 1,000 mg by mouth 2 (two) times daily with a meal.  ? metoprolol tartrate (LOPRESSOR) 50 MG tablet Take 50 mg by mouth 2 (two) times daily.  ? NON FORMULARY Heart healthy LCS diet  ? OLANZapine (ZYPREXA) 2.5 MG tablet Take 2.5 mg by mouth at bedtime.  ? omeprazole (PRILOSEC) 20 MG capsule Take 20 mg by mouth at bedtime.  ?  OXcarbazepine (TRILEPTAL) 150 MG tablet Take 150 mg by mouth 2 (two) times daily.  ? oxyCODONE (OXY IR/ROXICODONE) 5 MG immediate release tablet Take 2 tablets (10 mg total) by mouth every 6 (six) hours as needed for severe pain.  ? Sodium Phosphates (RA SALINE ENEMA RE) If not relieved by Biscodyl suppository, give disposable Saline Enema rectally X 1 dose/24 hrs as needed  ? diclofenac (VOLTAREN) 75 MG EC tablet Take 75 mg by mouth 2 (two) times daily.  ? tadalafil (CIALIS) 20 MG tablet Take 10 mg by mouth daily as needed for erectile dysfunction.  ? [DISCONTINUED] Semaglutide, 1 MG/DOSE, (OZEMPIC, 1 MG/DOSE,) 2 MG/1.5ML SOPN Inject 1 mg into the skin every Sunday.  ? ?No facility-administered encounter medications on file as of 10/06/2021.  ? ? ?  Review of Systems  ?Constitutional:  Negative for activity change, appetite change and fever.  ?HENT:  Negative for sore throat.   ?Eyes: Negative.   ?Cardiovascular:  Negative for chest pain and leg swelling.  ?Gastrointestinal:  Positive for diarrhea. Negative for abdominal distention and vomiting.  ?Genitourinary:  Negative for dysuria, frequency and urgency.  ?Skin:  Negative for color change.  ?Neurological:  Negative for dizziness and headaches.  ?Psychiatric/Behavioral:  Negative for behavioral problems and sleep disturbance. The patient is not nervous/anxious.    ? ? ? ? ?There is no immunization history on file for this patient. ?Pertinent  Health Maintenance Due  ?Topic Date Due  ? FOOT EXAM  Never done  ? OPHTHALMOLOGY EXAM  Never done  ? COLONOSCOPY (Pts 45-24yr Insurance coverage will need to be confirmed)  Never done  ? INFLUENZA VACCINE  02/03/2022  ? HEMOGLOBIN A1C  03/16/2022  ? ? ?  09/19/2021  ?  9:05 AM 09/20/2021  ?  4:53 PM 09/21/2021  ?  6:06 PM 09/21/2021  ?  8:00 PM 09/22/2021  ?  9:11 AM  ?Fall Risk  ?Patient Fall Risk Level High fall risk High fall risk High fall risk High fall risk High fall risk  ? ? ? ?Vitals:  ? 10/06/21 1446  ?BP: 134/75  ?Pulse:  90  ?Resp: 18  ?Temp: (!) 97.2 ?F (36.2 ?C)  ?Weight: 218 lb (98.9 kg)  ?Height: 6' (1.829 m)  ? ?Body mass index is 29.57 kg/m?. ? ?Physical Exam ?Constitutional:   ?   Appearance: Normal appearance.  ?HENT:

## 2021-10-06 NOTE — Progress Notes (Signed)
This encounter was created in error - please disregard.

## 2021-10-07 LAB — COMPREHENSIVE METABOLIC PANEL: Calcium: 8.8 (ref 8.7–10.7)

## 2021-10-07 LAB — BASIC METABOLIC PANEL
BUN: 12 (ref 4–21)
CO2: 21 (ref 13–22)
Chloride: 101 (ref 99–108)
Creatinine: 0.7 (ref 0.6–1.3)
Glucose: 129
Potassium: 4.1 mEq/L (ref 3.5–5.1)
Sodium: 135 — AB (ref 137–147)

## 2021-10-07 LAB — CBC AND DIFFERENTIAL
HCT: 34 — AB (ref 41–53)
Hemoglobin: 11.1 — AB (ref 13.5–17.5)
Platelets: 496 10*3/uL — AB (ref 150–400)
WBC: 5.9

## 2021-10-07 LAB — CBC: RBC: 3.95 (ref 3.87–5.11)

## 2021-10-09 ENCOUNTER — Telehealth: Payer: Self-pay

## 2021-10-09 NOTE — Telephone Encounter (Signed)
[name] called on behalf of [name] to request documentation of his surgery by Dr.Robins get sent to his lawyer. I advised that he will need to sign a release of information before we can release any info to his lawyer. [name] stated she will stop by our office next week to pick up the release form to be completed. ?

## 2021-10-10 ENCOUNTER — Encounter: Payer: Medicare HMO | Admitting: Vascular Surgery

## 2021-10-10 ENCOUNTER — Encounter (HOSPITAL_BASED_OUTPATIENT_CLINIC_OR_DEPARTMENT_OTHER): Payer: Medicare HMO | Admitting: Internal Medicine

## 2021-10-15 ENCOUNTER — Encounter: Payer: Self-pay | Admitting: Adult Health

## 2021-10-15 ENCOUNTER — Non-Acute Institutional Stay (SKILLED_NURSING_FACILITY): Payer: Medicare HMO | Admitting: Adult Health

## 2021-10-15 ENCOUNTER — Other Ambulatory Visit: Payer: Self-pay | Admitting: *Deleted

## 2021-10-15 ENCOUNTER — Encounter: Payer: Medicare HMO | Admitting: Podiatry

## 2021-10-15 DIAGNOSIS — I739 Peripheral vascular disease, unspecified: Secondary | ICD-10-CM

## 2021-10-15 DIAGNOSIS — E1142 Type 2 diabetes mellitus with diabetic polyneuropathy: Secondary | ICD-10-CM | POA: Diagnosis not present

## 2021-10-15 DIAGNOSIS — F333 Major depressive disorder, recurrent, severe with psychotic symptoms: Secondary | ICD-10-CM | POA: Diagnosis not present

## 2021-10-15 DIAGNOSIS — Z9889 Other specified postprocedural states: Secondary | ICD-10-CM

## 2021-10-15 DIAGNOSIS — I10 Essential (primary) hypertension: Secondary | ICD-10-CM | POA: Diagnosis not present

## 2021-10-15 DIAGNOSIS — L03119 Cellulitis of unspecified part of limb: Secondary | ICD-10-CM

## 2021-10-15 DIAGNOSIS — E11628 Type 2 diabetes mellitus with other skin complications: Secondary | ICD-10-CM

## 2021-10-15 NOTE — Progress Notes (Addendum)
SUDAIS, BANGHART A. ([id number]) ?Visit Report for 10/16/2021 ?Allergy List Details ?Patient Name: Date of Service: ?[name], [name] 10/16/2021 10:15 A M ?Medical Record Number: [id number] ?Patient Account Number: [id number] ?Date of Birth/Sex: Treating RN: ?Mar 27, 1956 (66 y.o. [name] ?Primary Care Keyandra Swenson: York Ram Other Clinician: ?Referring Rosene Pilling: ?Treating Karolyn Messing/Extender: Linton Ham ?SIKO RA, REBECCA ?Weeks in Treatment: 0 ?Allergies ?Active Allergies ?gemfibrozil ?OxyContin ?pork derived (porcine) ?simvastatin ?Allergy Notes ?Electronic Signature(s) ?Signed: 10/16/2021 2:31:44 PM By: Lorrin Jackson ?Previous Signature: 10/15/2021 9:16:47 AM Version By: Lorrin Jackson ?Entered By: Lorrin Jackson on 10/16/2021 10:41:27 ?-------------------------------------------------------------------------------- ?Arrival Information Details ?Patient Name: Date of Service: ?[name], [name] 10/16/2021 10:15 A M ?Medical Record Number: [id number] ?Patient Account Number: [id number] ?Date of Birth/Sex: Treating RN: ?[birth year] (66 y.o. [name] ?Primary Care Ogle Hoeffner: York Ram Other Clinician: ?Referring Reinhard Schack: ?Treating Reshanda Lewey/Extender: Linton Ham ?SIKO RA, REBECCA ?Weeks in Treatment: 0 ?Visit Information ?Patient Arrived: Wheel Chair ?Arrival Time: 10:24 ?Transfer Assistance: None ?Patient Identification Verified: Yes ?Secondary Verification Process Completed: Yes ?Patient Requires Transmission-Based Precautions: No ?Patient Has Alerts: Yes ?Patient Alerts: Patient on Blood Thinner ?09/14/21 ABI: R=.55 L=.29 ?09/14/21 TBI=Absent ?Electronic Signature(s) ?Signed: 10/16/2021 2:31:44 PM By: Lorrin Jackson ?Entered By: Lorrin Jackson on 10/16/2021 11:04:02 ?-------------------------------------------------------------------------------- ?Clinic Level of Care Assessment Details ?Patient Name: Date of Service: ?[name], [name] 10/16/2021 10:15 A M ?Medical Record Number:  [id number] ?Patient Account Number: [id number] ?Date of Birth/Sex: Treating RN: ?1955/10/20 (66 y.o. [name] ?Primary Care Corran Lalone: York Ram Other Clinician: ?Referring Tawfiq Favila: ?Treating Issa Kosmicki/Extender: Linton Ham ?SIKO RA, REBECCA ?Weeks in Treatment: 0 ?Clinic Level of Care Assessment Items ?TOOL 1 Quantity Score ?X- 1 0 ?Use when EandM and Procedure is performed on INITIAL visit ?ASSESSMENTS - Nursing Assessment / Reassessment ?X- 1 20 ?General Physical Exam (combine w/ comprehensive assessment (listed just below) when performed on new pt. evals) ?X- 1 25 ?Comprehensive Assessment (HX, ROS, Risk Assessments, Wounds Hx, etc.) ?ASSESSMENTS - Wound and Skin Assessment / Reassessment ?'[]'$  - 0 ?Dermatologic / Skin Assessment (not related to wound area) ?ASSESSMENTS - Ostomy and/or Continence Assessment and Care ?'[]'$  - 0 ?Incontinence Assessment and Management ?'[]'$  - 0 ?Ostomy Care Assessment and Management (repouching, etc.) ?PROCESS - Coordination of Care ?'[]'$  - 0 ?Simple Patient / Family Education for ongoing care ?X- 1 20 ?Complex (extensive) Patient / Family Education for ongoing care ?X- 1 10 ?Staff obtains Consents, Records, T Results / Process Orders ?est ?X- 1 10 ?Staff telephones HHA, Nursing Homes / Clarify orders / etc ?'[]'$  - 0 ?Routine Transfer to another Facility (non-emergent condition) ?'[]'$  - 0 ?Routine Hospital Admission (non-emergent condition) ?'[]'$  - 0 ?New Admissions / Biomedical engineer / Ordering NPWT Apligraf, etc. ?, ?'[]'$  - 0 ?Emergency Hospital Admission (emergent condition) ?PROCESS - Special Needs ?'[]'$  - 0 ?Pediatric / Minor Patient Management ?'[]'$  - 0 ?Isolation Patient Management ?'[]'$  - 0 ?Hearing / Language / Visual special needs ?'[]'$  - 0 ?Assessment of Community assistance (transportation, D/C planning, etc.) ?'[]'$  - 0 ?Additional assistance / Altered mentation ?'[]'$  - 0 ?Support Surface(s) Assessment (bed, cushion, seat, etc.) ?INTERVENTIONS - Miscellaneous ?'[]'$  -  0 ?External ear exam ?'[]'$  - 0 ?Patient Transfer (multiple staff / Civil Service fast streamer / Similar devices) ?'[]'$  - 0 ?Simple Staple / Suture removal (25 or less) ?'[]'$  - 0 ?Complex Staple / Suture removal (26 or more) ?'[]'$  - 0 ?Hypo/Hyperglycemic Management (do not check if billed separately) ?'[]'$  - 0 ?Ankle / Brachial Index (ABI) -  do not check if billed separately ?Has the patient been seen at the hospital within the last three years: Yes ?Total Score: 85 ?Level Of Care: New/Established - Level 3 ?Electronic Signature(s) ?Signed: 10/16/2021 2:31:44 PM By: Lorrin Jackson ?Signed: 10/16/2021 2:31:44 PM By: Lorrin Jackson ?Entered By: Lorrin Jackson on 10/16/2021 11:32:26 ?-------------------------------------------------------------------------------- ?Encounter Discharge Information Details ?Patient Name: ?Date of Service: ?[name], [name] 10/16/2021 10:15 A M ?Medical Record Number: [id number] ?Patient Account Number: [id number] ?Date of Birth/Sex: ?Treating RN: ?31-May-1956 (66 y.o. [name] ?Primary Care Niklas Chretien: York Ram ?Other Clinician: ?Referring Kamaree Wheatley: ?Treating Caydan Mctavish/Extender: Linton Ham ?SIKO RA, REBECCA ?Weeks in Treatment: 0 ?Encounter Discharge Information Items Post Procedure Vitals ?Discharge Condition: Stable ?Temperature (F): 98.5 ?Ambulatory Status: Wheelchair ?Pulse (bpm): 76 ?Discharge Destination: [place] ?Respiratory Rate (breaths/min): 18 ?Orders Sent: Yes ?Blood Pressure (mmHg): 149/84 ?Transportation: Other ?Schedule Follow-up Appointment: Yes ?Clinical Summary of Care: Provided on 10/16/2021 ?Form Type Recipient ?Paper Patient Patient ?Electronic Signature(s) ?Signed: 10/16/2021 2:31:44 PM By: Lorrin Jackson ?Entered By: Lorrin Jackson on 10/16/2021 11:34:08 ?-------------------------------------------------------------------------------- ?Lower Extremity Assessment Details ?Patient Name: ?Date of Service: ?[name], [name] 10/16/2021 10:15 A M ?Medical Record  Number: [id number] ?Patient Account Number: [id number] ?Date of Birth/Sex: ?Treating RN: ?05-29-1956 (66 y.o. [name] ?Primary Care Amro Winebarger: York Ram ?Other Clinician: ?Referring Leonila Speranza: ?Treating Arnika Larzelere/Extender: Linton Ham ?SIKO RA, REBECCA ?Weeks in Treatment: 0 ?Edema Assessment ?Assessed: [Left: No] [Right: Yes] ?Edema: [Left: N] [Right: o] ?Calf ?Left: Right: ?Point of Measurement: 33 cm From Medial Instep 37 cm ?Ankle ?Left: Right: ?Point of Measurement: 10 cm From Medial Instep 24 cm ?Knee To Floor ?Left: Right: ?From Medial Instep 46 cm ?Vascular Assessment ?Pulses: ?Dorsalis Pedis ?Palpable: [Right:No] ?Notes ?Arterial Studies 09/14/21 ABI: R=.55 L=.29 TBI=Absent ?Electronic Signature(s) ?Signed: 10/16/2021 2:31:44 PM By: Lorrin Jackson ?Entered By: Lorrin Jackson on 10/16/2021 11:03:03 ?-------------------------------------------------------------------------------- ?Multi Wound Chart Details ?Patient Name: ?Date of Service: ?[name], [name] 10/16/2021 10:15 A M ?Medical Record Number: [id number] ?Patient Account Number: [id number] ?Date of Birth/Sex: ?Treating RN: ?1956-03-30 (66 y.o. [name] ?Primary Care Willine Schwalbe: York Ram ?Other Clinician: ?Referring Claudio Mondry: ?Treating Sanad Fearnow/Extender: Linton Ham ?SIKO RA, REBECCA ?Weeks in Treatment: 0 ?Vital Signs ?Height(in): 72 ?Pulse(bpm): 76 ?Weight(lbs): 216 ?Blood Pressure(mmHg): 149/84 ?Body Mass Index(BMI): 29.3 ?Temperature(??F): 98.5 ?Respiratory Rate(breaths/min): 18 ?Photos: [N/A:N/A] ?Right Amputation Site - N/A N/A ?Wound Location: ?Transmetatarsal ?Surgical Injury N/A N/A ?Wounding Event: ?Dehisced Wound N/A N/A ?Primary Etiology: ?Asthma, Sleep Apnea, Hypertension, N/A N/A ?Comorbid History: ?Peripheral Venous Disease, Type II ?Diabetes, Osteomyelitis, Neuropathy ?09/19/2021 N/A N/A ?Date Acquired: ?0 N/A N/A ?Weeks of Treatment: ?Open N/A N/A ?Wound Status: ?No N/A N/A ?Wound Recurrence: ?13x9.8x2.8  N/A N/A ?Measurements L x W x D (cm) ?100.06 N/A N/A ?A (cm?) : ?rea ?280.167 N/A N/A ?Volume (cm?) : ?0.00% N/A N/A ?% Reduction in Area: ?0.00% N/A N/A ?% Reduction in Volume: ?Full Thickness With Exposed Sup

## 2021-10-15 NOTE — Progress Notes (Signed)
? ?Location:  [facility] ?Nursing Home Room Number: 161-W ?Place of Service:  SNF (31) ?Provider:  Durenda Age, DNP, FNP-BC ? ?Patient Care Team: ?Harvie Junior, MD as PCP - General (Family Medicine) ?Lavonna Monarch, MD as Consulting Physician (Dermatology) ? ?Extended Emergency Contact Information ?Primary Emergency Contact: Ogburn,[name] ?Mobile Phone: [phone] ?Relation: Significant other ? ?Code Status:  Full Code  ? ?Goals of care: Advanced Directive information ? ?  10/21/2021  ?  4:30 PM  ?Advanced Directives  ?Does Patient Have a Medical Advance Directive? No  ?Would patient like information on creating a medical advance directive? No - Patient declined  ? ? ? ?Chief Complaint  ?Patient presents with  ? Acute Visit  ?  Short-term rehabilitation visit  ? ? ?HPI:  ?Pt is a 66 y.o. male sateen today for an acute visit regarding short-term rehabilitation.  He is currently having PT and OT. ? ?Essential hypertension  _  SBPs ranging from 130-149, takes amlodipine 10 mg daily, 50 mg twice a day and lisinopril 20 mg 1 tab daily ? ?Type 2 diabetes mellitus with peripheral neuropathy (HCC) -  CBGs ranging from 98 to 326, takes NovoLog Mix 70-30 insulin 40 units twice a day, Jardiance 25 mg 1 tab daily ? ?PAD (peripheral artery disease) (HCC) -  S/P right leg below-knee popliteal artery to posterior tibial artery bypass on 09/15/2021, takes aspirin EC 81 mg daily, Plavix 75 mg daily atorvastatin 80 mg at bedtime ? ?Major psychotic depression, recurrent ([place]) -   no reported agitation, takes olanzapine 2.5 mg at bedtime, lurasidone 40 mg 1 tab every evening, ox carbamazepine 150 mg twice a day ? ?Cellulitis in diabetic foot ([place]) --completed antibiotics, wound treatment daily ? ? ?Past Medical History:  ?Diagnosis Date  ? Diabetes mellitus with peripheral vascular disease   ? Hypertension   ? Neuropathy   ? PTSD (post-traumatic stress disorder)   ? Sleep apnea   ? ?Past Surgical History:   ?Procedure Laterality Date  ? ABDOMINAL AORTOGRAM W/LOWER EXTREMITY Right 09/10/2021  ? Procedure: ABDOMINAL AORTOGRAM W/LOWER EXTREMITY;  Surgeon: [name], MD;  Location: [place] [facility];  Service: Cardiovascular;  Laterality: Right;  ? ABDOMINAL HERNIA REPAIR    ? AMPUTATION Right 09/19/2021  ? Procedure: TRANSMETATARSAL AMPUTATION FOOT;  Surgeon: Lorenda Peck, MD;  Location: [place];  Service: Podiatry;  Laterality: Right;  Surgical team to do local block  ? AMPUTATION TOE Right 09/15/2021  ? Procedure: AMPUTATION 4th TOE;  Surgeon: [name], MD;  Location: [place];  Service: Vascular;  Laterality: Right;  ? BACK SURGERY    ? FEMORAL-TIBIAL BYPASS GRAFT Right 09/15/2021  ? Procedure: BYPASS GRAFT BELOW KNEE POPLITEAL ARTERY TO POSTERIOR TIBIAL ARTERY;  Surgeon: [name], MD;  Location: [place];  Service: Vascular;  Laterality: Right;  ? IRRIGATION AND DEBRIDEMENT FOOT Right 09/17/2021  ? Procedure: IRRIGATION AND DEBRIDEMENT FOOT;  Surgeon: Lorenda Peck, MD;  Location: [place];  Service: Podiatry;  Laterality: Right;  Suregeon will do anesthesia block  ? KNEE SURGERY    ? PERIPHERAL VASCULAR BALLOON ANGIOPLASTY  09/10/2021  ? Procedure: PERIPHERAL VASCULAR BALLOON ANGIOPLASTY;  Surgeon: [name], MD;  Location: [place] [facility];  Service: Cardiovascular;;  rt sfa pta  ? SHOULDER SURGERY    ? VEIN HARVEST Right 09/15/2021  ? Procedure: GREATER SAPHENOUS VEIN HARVEST;  Surgeon: [name], MD;  Location: [place];  Service: Vascular;  Laterality: Right;  ? ? ?Allergies  ?  Allergen Reactions  ? Gemfibrozil Other (See Comments)  ?  Doesn't know its been awhile  ? Oxycontin [Oxycodone Hcl] Other (See Comments)  ?  Bad reaction "took me to a place I never wanna go again"  ? Pork-Derived Products   ? Simvastatin Other (See Comments)  ?  Says he doesn't know its been awhile  ? ? ?Outpatient Encounter Medications as of 10/15/2021  ?Medication Sig  ? albuterol (VENTOLIN HFA) 108 (90 Base)  MCG/ACT inhaler Inhale 2 puffs into the lungs every 6 (six) hours as needed for wheezing or shortness of breath.  ? amLODipine (NORVASC) 5 MG tablet Take 5 mg by mouth every morning.  ? aspirin EC 81 MG tablet Take 1 tablet (81 mg total) by mouth daily. Swallow whole.  ? atorvastatin (LIPITOR) 80 MG tablet Take 80 mg by mouth at bedtime.  ? bisacodyl (DULCOLAX) 10 MG suppository If not relieved by MOM, give 10 mg Bisacodyl suppositiory rectally X 1 dose in 24 hours as needed  ? buPROPion (WELLBUTRIN XL) 300 MG 24 hr tablet Take 300 mg by mouth every morning.  ? carboxymethylcellulose (REFRESH PLUS) 0.5 % SOLN Place 1 drop into both eyes 4 (four) times daily as needed (dry eyes).  ? cholecalciferol (VITAMIN D) 25 MCG (1000 UNIT) tablet Take 1,000 Units by mouth daily.  ? clopidogrel (PLAVIX) 75 MG tablet Take 1 tablet (75 mg total) by mouth daily.  ? diclofenac (VOLTAREN) 75 MG EC tablet Take 75 mg by mouth 2 (two) times daily.  ? empagliflozin (JARDIANCE) 25 MG TABS tablet Take 25 mg by mouth daily.  ? fluticasone furoate-vilanterol (BREO ELLIPTA) 100-25 MCG/INH AEPB Inhale 1 puff into the lungs daily.  ? gabapentin (NEURONTIN) 300 MG capsule Take 300 mg by mouth 2 (two) times daily.  ? gabapentin (NEURONTIN) 600 MG tablet Take 600 mg by mouth 2 (two) times daily as needed.  ? insulin aspart protamine- aspart (NOVOLOG MIX 70/30) (70-30) 100 UNIT/ML injection Inject 40 Units into the skin 2 (two) times daily with a meal.  ? lisinopril (ZESTRIL) 20 MG tablet Take 1 tablet (20 mg total) by mouth daily.  ? lurasidone (LATUDA) 40 MG TABS tablet Take 40 mg by mouth daily with supper.  ? Magnesium Hydroxide (MILK OF MAGNESIA PO) If no BM in 3 days, give 30 cc Milk of Magnesium p.o. x 1 dose in 24 hours as needed (Do not use standing constipation orders for residents with renal failure CFR less than 30. Contact MD for orders)  ? meclizine (ANTIVERT) 25 MG tablet Take 1 tablet (25 mg total) by mouth 3 (three) times daily as  needed for dizziness.  ? metoprolol tartrate (LOPRESSOR) 50 MG tablet Take 50 mg by mouth 2 (two) times daily.  ? NON FORMULARY Heart healthy LCS diet  ? OLANZapine (ZYPREXA) 2.5 MG tablet Take 2.5 mg by mouth at bedtime.  ? omeprazole (PRILOSEC) 20 MG capsule Take 20 mg by mouth at bedtime.  ? OXcarbazepine (TRILEPTAL) 150 MG tablet Take 150 mg by mouth 2 (two) times daily.  ? Oxycodone HCl 10 MG TABS Take 10 mg by mouth every 6 (six) hours as needed.  ? Sodium Phosphates (RA SALINE ENEMA RE) If not relieved by Biscodyl suppository, give disposable Saline Enema rectally X 1 dose/24 hrs as needed  ? [DISCONTINUED] metFORMIN (GLUCOPHAGE) 1000 MG tablet Take 1,000 mg by mouth 2 (two) times daily with a meal.  ? [DISCONTINUED] oxyCODONE (OXY IR/ROXICODONE) 5 MG immediate release tablet Take 2  tablets (10 mg total) by mouth every 6 (six) hours as needed for severe pain. (Patient not taking: Reported on 10/15/2021)  ? [DISCONTINUED] tadalafil (CIALIS) 20 MG tablet Take 10 mg by mouth daily as needed for erectile dysfunction.  ? ?No facility-administered encounter medications on file as of 10/15/2021.  ? ? ?Review of Systems  ?Constitutional:  Negative for activity change, appetite change and fever.  ?HENT:  Negative for sore throat.   ?Eyes: Negative.   ?Cardiovascular:  Negative for chest pain and leg swelling.  ?Gastrointestinal:  Negative for abdominal distention, diarrhea and vomiting.  ?Genitourinary:  Negative for dysuria, frequency and urgency.  ?Skin:  Negative for color change.  ?Neurological:  Negative for dizziness and headaches.  ?Psychiatric/Behavioral:  Negative for behavioral problems and sleep disturbance. The patient is not nervous/anxious.    ? ? ? ?Immunization History  ?Administered Date(s) Administered  ? Influenza, High Dose Seasonal PF 09/03/2021  ? PFIZER(Purple Top)SARS-COV-2 Vaccination 08/07/2019, 09/04/2019  ? Pension scheme manager 19yr & up 03/06/2021  ?  Pneumococcal-Unspecified 07/06/2018  ? Zoster Recombinat (Shingrix) 02/04/2020  ? ?Pertinent  Health Maintenance Due  ?Topic Date Due  ? FOOT EXAM  Never done  ? OPHTHALMOLOGY EXAM  Never done  ? COLONOSCOPY (Pts [id number]

## 2021-10-16 ENCOUNTER — Encounter (HOSPITAL_BASED_OUTPATIENT_CLINIC_OR_DEPARTMENT_OTHER): Payer: Medicare HMO | Attending: Internal Medicine | Admitting: Internal Medicine

## 2021-10-16 DIAGNOSIS — E11621 Type 2 diabetes mellitus with foot ulcer: Secondary | ICD-10-CM | POA: Diagnosis not present

## 2021-10-16 DIAGNOSIS — E1152 Type 2 diabetes mellitus with diabetic peripheral angiopathy with gangrene: Secondary | ICD-10-CM | POA: Diagnosis not present

## 2021-10-16 DIAGNOSIS — L97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 severity: Secondary | ICD-10-CM | POA: Insufficient documentation

## 2021-10-16 DIAGNOSIS — G4733 Obstructive sleep apnea (adult) (pediatric): Secondary | ICD-10-CM | POA: Insufficient documentation

## 2021-10-16 DIAGNOSIS — Z87891 Personal history of nicotine dependence: Secondary | ICD-10-CM | POA: Insufficient documentation

## 2021-10-16 DIAGNOSIS — E1142 Type 2 diabetes mellitus with diabetic polyneuropathy: Secondary | ICD-10-CM | POA: Insufficient documentation

## 2021-10-16 DIAGNOSIS — I10 Essential (primary) hypertension: Secondary | ICD-10-CM | POA: Diagnosis not present

## 2021-10-16 NOTE — Progress Notes (Signed)
SHAYDON, LEASE A. ([id number]) ?Visit Report for 10/16/2021 ?Chief Complaint Document Details ?Patient Name: Date of Service: ?[name], [name] 10/16/2021 10:15 A M ?Medical Record Number: [id number] ?Patient Account Number: [id number] ?Date of Birth/Sex: Treating RN: ?09/22/1955 (66 y.o. [name] ?Primary Care Provider: York Ram Other Clinician: ?Referring Provider: ?Treating Provider/Extender: Linton Ham ?SIKO RA, REBECCA ?Weeks in Treatment: 0 ?Information Obtained from: Patient ?Chief Complaint ?10/16/20; patient is here with a substantial wound on his right foot in the setting of a recent transmetatarsal amputation. ?Electronic Signature(s) ?Signed: 10/16/2021 4:28:39 PM By: Linton Ham MD ?Entered By: Linton Ham on 10/16/2021 11:53:20 ?-------------------------------------------------------------------------------- ?Debridement Details ?Patient Name: Date of Service: ?[name], [name] 10/16/2021 10:15 A M ?Medical Record Number: [id number] ?Patient Account Number: [id number] ?Date of Birth/Sex: Treating RN: ?1956-04-22 (66 y.o. [name] ?Primary Care Provider: York Ram Other Clinician: ?Referring Provider: ?Treating Provider/Extender: Linton Ham ?SIKO RA, REBECCA ?Weeks in Treatment: 0 ?Debridement Performed for Assessment: Wound #1 Right Amputation Site - Transmetatarsal ?Performed By: Physician Ricard Dillon., MD ?Debridement Type: Debridement ?Level of Consciousness (Pre-procedure): Awake and Alert ?Pre-procedure Verification/Time Out Yes - 11:17 ?Taken: ?Start Time: 11:18 ?Pain Control: ?Other : Benzocaine ?T Area Debrided (L x W): ?otal 4 (cm) x 4 (cm) = 16 (cm?) ?Tissue and other material debrided: Non-Viable, [place], Subcutaneous, [place] ?Level: Skin/Subcutaneous Tissue ?Debridement Description: Excisional ?Instrument: Curette, Forceps, Scissors ?Bleeding: Minimum ?Hemostasis Achieved: Pressure ?End Time: 11:25 ?Response to Treatment: Procedure  was tolerated well ?Level of Consciousness (Post- Awake and Alert ?procedure): ?Post Debridement Measurements of Total Wound ?Length: (cm) 13 ?Width: (cm) 9.8 ?Depth: (cm) 2.8 ?Volume: (cm?) 280.167 ?Character of Wound/Ulcer Post Debridement: Requires Further Debridement ?Post Procedure Diagnosis ?Same as Pre-procedure ?Electronic Signature(s) ?Signed: 10/16/2021 2:31:44 PM By: Lorrin Jackson ?Signed: 10/16/2021 4:28:39 PM By: Linton Ham MD ?Entered By: Linton Ham on 10/16/2021 11:52:40 ?-------------------------------------------------------------------------------- ?HPI Details ?Patient Name: Date of Service: ?[name], [name] 10/16/2021 10:15 A M ?Medical Record Number: [id number] ?Patient Account Number: [id number] ?Date of Birth/Sex: Treating RN: ?1955/07/27 (66 y.o. [name] ?Primary Care Provider: York Ram Other Clinician: ?Referring Provider: ?Treating Provider/Extender: Linton Ham ?SIKO RA, REBECCA ?Weeks in Treatment: 0 ?History of Present Illness ?HPI Description: ADMISSION ?10/16/20 ?This is a 66 year old man who is a type II diabetic. Initially seen by Dr. Unk Lightning of vein and vascular on 09/10/2021 for bilateral lower extremity rest pain right ?greater than left. His ABIs demonstrated monophasic waveforms at the ankles bilaterally with depressed toe pressures. His ABI in the right was 0.5 and on the ?left 0.28. There were no obtainable waveforms for TBI's. He underwent angiography on 09/10/2021 and had findings of severe PAD with 95% stenosis of the ?distal SFA. He also had peroneal and posterior tibial arteries occluded with significant collateralization in the calf there was reconstitution of the posterior tibial ?artery in the distal third of the calf. [place] of the anterior tibial artery was not appreciated. He underwent a angioplasty of the superficial femoral artery. He ?required admission to hospital from 09/12/2021 through 09/22/2021 with right foot cellulitis and sepsis.  On 3/13 he underwent a right below-knee popliteal to ?posterior tibial bypass using a greater saphenous vein. Ultimately however he required a right TMA by podiatry which I believe was done on 3/17 for ?progressive diabetic foot infection. He was discharged to [facility] skilled facility. ?He arrives in clinic today with a large wound area over the entirety of his amputation site extending proximally. The attempted  flap closure of the amputation ?site and his TMA has failed and the wound extends under the attempt to closure. Still some sutures in place. There is an odor but he is not systemically unwell. ?He is due for follow-up arterial studies and has an appointment with vascular surgery on 10/28/2021. They are using wet-to-dry dressings at [facility]. ?Past medical history includes type 2 diabetes with peripheral neuropathy, hypertension, obstructive sleep apnea ?Electronic Signature(s) ?Signed: 10/16/2021 4:28:39 PM By: Linton Ham MD ?Entered By: Linton Ham on 10/16/2021 12:05:51 ?-------------------------------------------------------------------------------- ?Physical Exam Details ?Patient Name: Date of Service: ?[name], [name] 10/16/2021 10:15 A M ?Medical Record Number: [id number] ?Patient Account Number: [id number] ?Date of Birth/Sex: Treating RN: ?08-23-1955 (66 y.o. [name] ?Primary Care Provider: York Ram Other Clinician: ?Referring Provider: ?Treating Provider/Extender: Linton Ham ?SIKO RA, REBECCA ?Weeks in Treatment: 0 ?Constitutional ?Patient is hypertensive.. Pulse regular and within target range for patient.Marland Kitchen Respirations regular, non-labored and within target range.. Temperature is normal and ?within the target range for the patient.Marland Kitchen Appears in no distress. ?Cardiovascular ?He has a faint right dorsalis pedis and posterior tibial pulse which was surprising.Marland Kitchen ?Notes ?Wound exam; large defect over the entirety of his TMA amputation site extending proximally. Moderate  amount of necrotic tissue which I removed with pickups ?and scissors and a #5 curette. The wound extends over the metatarsal heads under the surgical flaps. There is an odor but no purulence no surrounding soft ?tissue erythema or streaking. ?His popliteal bypass surgical site has dry flaking tissue skin over the top but this does not look open ?Electronic Signature(s) ?Signed: 10/16/2021 4:28:39 PM By: Linton Ham MD ?Entered By: Linton Ham on 10/16/2021 12:07:51 ?-------------------------------------------------------------------------------- ?Physician Orders Details ?Patient Name: ?Date of Service: ?[name], [name] 10/16/2021 10:15 A M ?Medical Record Number: [id number] ?Patient Account Number: [id number] ?Date of Birth/Sex: ?Treating RN: ?Jan 07, 1956 (66 y.o. [name] ?Primary Care Provider: York Ram ?Other Clinician: ?Referring Provider: ?Treating Provider/Extender: Linton Ham ?SIKO RA, REBECCA ?Weeks in Treatment: 0 ?Verbal / Phone Orders: No ?Diagnosis Coding ?Follow-up Appointments ?ppointment in 1 week. - with Dr. Heber Chinle Leveda Anna, Room 7) ?Return A ?Other: - Keep appointment with Vascular on 10/28/21 ?Bathing/ Shower/ Hygiene ?May shower and wash wound with soap and water. - May wash with antibacterial soap with dressing changes ?Off-Loading ?Open toe surgical shoe to: - to right foot ?Additional Orders / Instructions ?Follow Nutritious Diet - -High Protein Diet ?-Monitor/Control Blood Sugar ?Wound Treatment ?Wound #1 - Amputation Site - Transmetatarsal Wound Laterality: Right ?Cleanser: Soap and Water 1 x Per Day/30 Days ?Discharge Instructions: May shower and wash wound with dial antibacterial soap and water prior to dressing change. ?Cleanser: Wound Cleanser 1 x Per Day/30 Days ?Discharge Instructions: Cleanse the wound with wound cleanser prior to applying a clean dressing using gauze sponges, not tissue or cotton balls. ?Topical: Dakins 1/4 Strength 1 x Per Day/30  Days ?Discharge Instructions: Moisten gauze with Dakins ?Secondary Dressing: ABD Pad, 8x10 1 x Per Day/30 Days ?Discharge Instructions: Apply over primary dressing as directed. ?Secondary Dressing: Woven Gauze

## 2021-10-16 NOTE — Progress Notes (Signed)
PEARLEY, BARANEK A. ([id number]) ?Visit Report for 10/16/2021 ?Abuse Risk Screen Details ?Patient Name: Date of Service: ?[name], [name] 10/16/2021 10:15 A M ?Medical Record Number: [id number] ?Patient Account Number: [id number] ?Date of Birth/Sex: Treating RN: ?[birth year] (66 y.o. [name] ?Primary Care Batool Majid: York Ram Other Clinician: ?Referring Monte Zinni: ?Treating Anie Juniel/Extender: Linton Ham ?SIKO RA, REBECCA ?Weeks in Treatment: 0 ?Abuse Risk Screen Items ?Answer ?ABUSE RISK SCREEN: ?Has anyone close to you tried to hurt or harm you recentlyo No ?Do you feel uncomfortable with anyone in your familyo No ?Has anyone forced you do things that you didnt want to doo No ?Electronic Signature(s) ?Signed: 10/16/2021 2:31:44 PM By: Lorrin Jackson ?Entered By: Lorrin Jackson on 10/16/2021 10:44:44 ?-------------------------------------------------------------------------------- ?Activities of Daily Living Details ?Patient Name: Date of Service: ?[name], [name] 10/16/2021 10:15 A M ?Medical Record Number: [id number] ?Patient Account Number: [id number] ?Date of Birth/Sex: Treating RN: ?December 08, 1955 (66 y.o. [name] ?Primary Care Aundre Hietala: York Ram Other Clinician: ?Referring Lisett Dirusso: ?Treating Arlana Canizales/Extender: Linton Ham ?SIKO RA, REBECCA ?Weeks in Treatment: 0 ?Activities of Daily Living Items ?Answer ?Activities of Daily Living (Please select one for each item) ?Drive Automobile Not Able ?T Medications ?ake Need Assistance ?Use T elephone Completely Able ?Care for Appearance Completely Able ?Use T oilet Completely Able ?Bath / Shower Need Assistance ?Dress Self Completely Able ?Feed Self Completely Able ?Walk Need Assistance ?Get In / Out Bed Completely Able ?Housework Need Assistance ?Prepare Meals Need Assistance ?Handle Money Completely Able ?Shop for Self Need Assistance ?Electronic Signature(s) ?Signed: 10/16/2021 2:31:44 PM By: Lorrin Jackson ?Entered By:  Lorrin Jackson on 10/16/2021 10:45:23 ?-------------------------------------------------------------------------------- ?Education Screening Details ?Patient Name: ?Date of Service: ?[name], [name] 10/16/2021 10:15 A M ?Medical Record Number: [id number] ?Patient Account Number: [id number] ?Date of Birth/Sex: ?Treating RN: ?May 07, 1956 (66 y.o. [name] ?Primary Care Andreka Stucki: York Ram ?Other Clinician: ?Referring Crystie Yanko: ?Treating Ryder Man/Extender: Linton Ham ?SIKO RA, REBECCA ?Weeks in Treatment: 0 ?Primary Learner Assessed: Patient ?Learning Preferences/Education Level/Primary Language ?Learning Preference: Explanation, Demonstration, Printed Material ?Highest Education Level: College or Above ?Preferred Language: English ?Cognitive Barrier ?Language Barrier: No ?Translator Needed: No ?Memory Deficit: No ?Emotional Barrier: No ?Cultural/Religious Beliefs Affecting Medical Care: No ?Physical Barrier ?Impaired Vision: Yes Glasses ?Impaired Hearing: No ?Decreased Hand dexterity: No ?Knowledge/Comprehension ?Knowledge Level: High ?Comprehension Level: High ?Ability to understand written instructions: High ?Ability to understand verbal instructions: High ?Motivation ?Anxiety Level: Calm ?Cooperation: Cooperative ?Education Importance: Acknowledges Need ?Interest in Health Problems: Asks Questions ?Perception: Coherent ?Willingness to Engage in Self-Management High ?Activities: ?Readiness to Engage in Self-Management High ?Activities: ?Electronic Signature(s) ?Signed: 10/16/2021 2:31:44 PM By: Lorrin Jackson ?Entered By: Lorrin Jackson on 10/16/2021 10:45:52 ?-------------------------------------------------------------------------------- ?Fall Risk Assessment Details ?Patient Name: ?Date of Service: ?[name], [name] 10/16/2021 10:15 A M ?Medical Record Number: [id number] ?Patient Account Number: [id number] ?Date of Birth/Sex: ?Treating RN: ?1955/09/25 (66 y.o. [name] ?Primary Care  Taylan Mayhan: York Ram ?Other Clinician: ?Referring Tymeer Vaquera: ?Treating Sharay Bellissimo/Extender: Linton Ham ?SIKO RA, REBECCA ?Weeks in Treatment: 0 ?Fall Risk Assessment Items ?Have you had 2 or more falls in the last 12 monthso 0 No ?Have you had any fall that resulted in injury in the last 12 monthso 0 No ?FALLS RISK SCREEN ?History of falling - immediate or within 3 months 0 No ?Secondary diagnosis (Do you have 2 or more medical diagnoseso) 15 Yes ?Ambulatory aid ?None/bed rest/wheelchair/nurse 0 No ?Crutches/cane/walker 15 Yes ?Furniture 0 No ?Intravenous therapy Access/Saline/Heparin Lock 0  No ?Gait/Transferring ?Normal/ bed rest/ wheelchair 0 Yes ?Weak (short steps with or without shuffle, stooped but able to lift head while walking, may seek 0 No ?support from furniture) ?Impaired (short steps with shuffle, may have difficulty arising from chair, head down, impaired 0 No ?balance) ?Mental Status ?Oriented to own ability 0 Yes ?Electronic Signature(s) ?Signed: 10/16/2021 2:31:44 PM By: Lorrin Jackson ?Entered By: Lorrin Jackson on 10/16/2021 10:46:10 ?-------------------------------------------------------------------------------- ?Foot Assessment Details ?Patient Name: ?Date of Service: ?[name], [name] 10/16/2021 10:15 A M ?Medical Record Number: [id number] ?Patient Account Number: [id number] ?Date of Birth/Sex: ?Treating RN: ?Nov 08, 1955 (66 y.o. [name] ?Primary Care Jarquis Walker: York Ram ?Other Clinician: ?Referring Federick Levene: ?Treating Rahma Meller/Extender: Linton Ham ?SIKO RA, REBECCA ?Weeks in Treatment: 0 ?Foot Assessment Items ?Site Locations ?+ = Sensation present, - = Sensation absent, C = Callus, U = Ulcer ?R = Redness, W = Warmth, M = Maceration, PU = Pre-ulcerative lesion ?F = Fissure, S = Swelling, D = Dryness ?Assessment ?Right: Left: ?Other Deformity: No No ?Prior Foot Ulcer: No No ?Prior Amputation: Yes No ?Charcot Joint: No No ?Ambulatory Status: Ambulatory With  Help ?Assistance Device: Walker ?Gait: Steady ?Electronic Signature(s) ?Signed: 10/16/2021 2:31:44 PM By: Lorrin Jackson ?Entered By: Lorrin Jackson on 10/16/2021 11:00:24 ?-------------------------------------------------------------------------------- ?Nutrition Risk Screening Details ?Patient Name: ?Date of Service: ?[name], [name] 10/16/2021 10:15 A M ?Medical Record Number: [id number] ?Patient Account Number: [id number] ?Date of Birth/Sex: ?Treating RN: ?17-Oct-1955 (66 y.o. [name] ?Primary Care Jhony Antrim: York Ram ?Other Clinician: ?Referring Demitria Hay: ?Treating Babyboy Loya/Extender: Linton Ham ?SIKO RA, REBECCA ?Weeks in Treatment: 0 ?Height (in): 72 ?Weight (lbs): 216 ?Body Mass Index (BMI): 29.3 ?Nutrition Risk Screening Items ?Score Screening ?NUTRITION RISK SCREEN: ?I have an illness or condition that made me change the kind and/or amount of food I eat 0 No ?I eat fewer than two meals per day 0 No ?I eat few fruits and vegetables, or milk products 0 No ?I have three or more drinks of beer, liquor or wine almost every day 0 No ?I have tooth or mouth problems that make it hard for me to eat 0 No ?I don't always have enough money to buy the food I need 0 No ?I eat alone most of the time 0 No ?I take three or more different prescribed or over-the-counter drugs a day 1 Yes ?Without wanting to, I have lost or gained 10 pounds in the last six months 0 No ?I am not always physically able to shop, cook and/or feed myself 0 No ?Nutrition Protocols ?Good Risk Protocol 0 No interventions needed ?Moderate Risk Protocol ?High Risk Proctocol ?Risk Level: Good Risk ?Score: 1 ?Electronic Signature(s) ?Signed: 10/16/2021 2:31:44 PM By: Lorrin Jackson ?Entered By: Lorrin Jackson on 10/16/2021 10:46:24 ?

## 2021-10-21 ENCOUNTER — Non-Acute Institutional Stay (SKILLED_NURSING_FACILITY): Payer: Medicare HMO | Admitting: Adult Health

## 2021-10-21 ENCOUNTER — Encounter: Payer: Self-pay | Admitting: Adult Health

## 2021-10-21 DIAGNOSIS — I739 Peripheral vascular disease, unspecified: Secondary | ICD-10-CM

## 2021-10-21 DIAGNOSIS — E11628 Type 2 diabetes mellitus with other skin complications: Secondary | ICD-10-CM

## 2021-10-21 DIAGNOSIS — F333 Major depressive disorder, recurrent, severe with psychotic symptoms: Secondary | ICD-10-CM

## 2021-10-21 DIAGNOSIS — L03119 Cellulitis of unspecified part of limb: Secondary | ICD-10-CM

## 2021-10-21 DIAGNOSIS — E1142 Type 2 diabetes mellitus with diabetic polyneuropathy: Secondary | ICD-10-CM

## 2021-10-21 DIAGNOSIS — I10 Essential (primary) hypertension: Secondary | ICD-10-CM

## 2021-10-21 DIAGNOSIS — K219 Gastro-esophageal reflux disease without esophagitis: Secondary | ICD-10-CM

## 2021-10-21 DIAGNOSIS — J449 Chronic obstructive pulmonary disease, unspecified: Secondary | ICD-10-CM

## 2021-10-21 DIAGNOSIS — G629 Polyneuropathy, unspecified: Secondary | ICD-10-CM

## 2021-10-21 NOTE — Progress Notes (Signed)
? ?Location:  [facility] ?Nursing Home Room Number: [id number] ?Place of Service:  SNF (31) ?Provider:  Durenda Age, DNP, FNP-BC ? ?Patient Care Team: ?Harvie Junior, MD as PCP - General (Family Medicine) ?Lavonna Monarch, MD as Consulting Physician (Dermatology) ? ?Extended Emergency Contact Information ?Primary Emergency Contact: Ogburn,[name] ?Mobile Phone: [phone] ?Relation: Significant other ? ?Code Status:  Full Code  ? ?Goals of care: Advanced Directive information ? ?  10/21/2021  ?  4:30 PM  ?Advanced Directives  ?Does Patient Have a Medical Advance Directive? No  ?Would patient like information on creating a medical advance directive? No - Patient declined  ? ? ? ?Chief Complaint  ?Patient presents with  ? Discharge Note  ?  For discharge home on 10/23/21 from [place]   ? ? ?HPI:  ?[name] is a 66 y.o. male who is for discharge home on 10/23/2021 with home health [name], OT and Nursing. ? ?He was admitted to [facility] on 09/22/2021 post hospital admission 09/12/2021 to 09/22/21.  He has a PMH of type 2 diabetes mellitus, peripheral neuropathy and obstructive sleep apnea.  He presented to the hospital with right foot redness progressed to blistering on the dorsum of the right foot and associated spreading inflammation rapidly progressing within 24 hours.  He had an angiography and angioplasty performed by Dr. Unk Lightning, vascular surgery, on 3/8.  Labs showed creatinine 1.85, WBC 21.8, thrombocytosis of 623 and lactic acid 1.8.  Blood cultures were drawn.  He was started on IV vancomycin and Zosyn for severe nonpurulent cellulitis.  Blood cultures were negative.  Vascular surgery was consulted and underwent right leg below-knee popliteal artery to posterior tibial artery bypass using nonreversed greater saphenous vein and fourth metatarsal ray amputation, left open on 09/15/2021.  He, also, underwent irrigation and debridement of the right foot by podiatry on  09/17/2021.  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 He was started on Duricef X 1 day in the hospital and  continued X 13  more days upon discharge. Creatinine was 0.70, normal, on discharge. ? ?Patient was admitted to this facility for short-term rehabilitation after the patient's recent hospitalization.  Patient has completed SNF rehabilitation and therapy has cleared the patient for discharge. ? ? ?Past Medical History:  ?Diagnosis Date  ? Diabetes mellitus with peripheral vascular disease   ? Hypertension   ? Neuropathy   ? PTSD (post-traumatic stress disorder)   ? Sleep apnea   ? ?Past Surgical History:  ?Procedure Laterality Date  ? ABDOMINAL AORTOGRAM W/LOWER EXTREMITY Right 09/10/2021  ? Procedure: ABDOMINAL AORTOGRAM W/LOWER EXTREMITY;  Surgeon: [name], MD;  Location: [place] [facility];  Service: Cardiovascular;  Laterality: Right;  ? ABDOMINAL HERNIA REPAIR    ? AMPUTATION Right 09/19/2021  ? Procedure: TRANSMETATARSAL AMPUTATION FOOT;  Surgeon: Lorenda Peck, MD;  Location: [place];  Service: Podiatry;  Laterality: Right;  Surgical team to do local block  ? AMPUTATION TOE Right 09/15/2021  ? Procedure: AMPUTATION 4th TOE;  Surgeon: [name], MD;  Location: [place];  Service: Vascular;  Laterality: Right;  ? BACK SURGERY    ? FEMORAL-TIBIAL BYPASS GRAFT Right 09/15/2021  ? Procedure: BYPASS GRAFT BELOW KNEE POPLITEAL ARTERY TO POSTERIOR TIBIAL ARTERY;  Surgeon: [name], MD;  Location: [place];  Service: Vascular;  Laterality: Right;  ? IRRIGATION AND DEBRIDEMENT FOOT Right 09/17/2021  ? Procedure: IRRIGATION AND DEBRIDEMENT FOOT;  Surgeon: Lorenda Peck, MD;  Location: [place];  Service: Podiatry;  Laterality: Right;  Suregeon will do anesthesia block  ? KNEE SURGERY    ? PERIPHERAL VASCULAR BALLOON ANGIOPLASTY  09/10/2021  ? Procedure: PERIPHERAL VASCULAR BALLOON ANGIOPLASTY;  Surgeon: [name], MD;  Location: [place] [facility];  Service:  Cardiovascular;;  rt sfa pta  ? SHOULDER SURGERY    ? VEIN HARVEST Right 09/15/2021  ? Procedure: GREATER SAPHENOUS VEIN HARVEST;  Surgeon: [name], MD;  Location: [place];  Service: Vascular;  Laterality: Right;  ? ? ?Allergies  ?Allergen Reactions  ? Gemfibrozil Other (See Comments)  ?  Doesn't know its been awhile  ? Oxycontin [Oxycodone Hcl] Other (See Comments)  ?  Bad reaction "took me to a place I never wanna go again"  ? Pork-Derived Products   ? Simvastatin Other (See Comments)  ?  Says he doesn't know its been awhile  ? ? ?Outpatient Encounter Medications as of 10/21/2021  ?Medication Sig  ? aspirin EC 81 MG tablet Take 1 tablet (81 mg total) by mouth daily. Swallow whole.  ? bisacodyl (DULCOLAX) 10 MG suppository If not relieved by MOM, give 10 mg Bisacodyl suppositiory rectally X 1 dose in 24 hours as needed  ? carboxymethylcellulose (REFRESH PLUS) 0.5 % SOLN Place 1 drop into both eyes 4 (four) times daily as needed (dry eyes).  ? cholecalciferol (VITAMIN D) 25 MCG (1000 UNIT) tablet Take 1,000 Units by mouth daily.  ? fluticasone furoate-vilanterol (BREO ELLIPTA) 100-25 MCG/INH AEPB Inhale 1 puff into the lungs daily.  ? gabapentin (NEURONTIN) 600 MG tablet Take 600 mg by mouth 2 (two) times daily as needed.  ? Magnesium Hydroxide (MILK OF MAGNESIA PO) If no BM in 3 days, give 30 cc Milk of Magnesium p.o. x 1 dose in 24 hours as needed (Do not use standing constipation orders for residents with renal failure CFR less than 30. Contact MD for orders)  ? NON FORMULARY Heart healthy LCS diet  ? omeprazole (PRILOSEC) 20 MG capsule Take 20 mg by mouth at bedtime.  ? Sodium Phosphates (RA SALINE ENEMA RE) If not relieved by Biscodyl suppository, give disposable Saline Enema rectally X 1 dose/24 hrs as needed  ? [DISCONTINUED] diclofenac (VOLTAREN) 75 MG EC tablet Take 75 mg by mouth 2 (two) times daily.  ? albuterol (VENTOLIN HFA) 108 (90 Base) MCG/ACT inhaler Inhale 2 puffs into the lungs every 6 (six)  hours as needed for wheezing or shortness of breath.  ? amLODipine (NORVASC) 5 MG tablet Take 1 tablet (5 mg total) by mouth every morning.  ? atorvastatin (LIPITOR) 80 MG tablet Take 1 tablet (80 mg total) by mouth at bedtime.  ? buPROPion (WELLBUTRIN XL) 300 MG 24 hr tablet Take 1 tablet (300 mg total) by mouth every morning.  ? clopidogrel (PLAVIX) 75 MG tablet Take 1 tablet (75 mg total) by mouth daily.  ? empagliflozin (JARDIANCE) 25 MG TABS tablet Take 1 tablet (25 mg total) by mouth daily.  ? gabapentin (NEURONTIN) 300 MG capsule Take 1 capsule (300 mg total) by mouth 2 (two) times daily.  ? insulin aspart protamine- aspart (NOVOLOG MIX 70/30) (70-30) 100 UNIT/ML injection Inject 0.4 mLs (40 Units total) into the skin 2 (two) times daily with a meal.  ? lisinopril (ZESTRIL) 20 MG tablet Take 1 tablet (20 mg total) by mouth daily.  ? lurasidone (LATUDA) 40 MG TABS tablet Take 1 tablet (40 mg total) by mouth daily with supper.  ? meclizine (ANTIVERT)  25 MG tablet Take 1 tablet (25 mg total) by mouth 3 (three) times daily as needed for dizziness.  ? metoprolol tartrate (LOPRESSOR) 50 MG tablet Take 1 tablet (50 mg total) by mouth 2 (two) times daily.  ? OLANZapine (ZYPREXA) 2.5 MG tablet Take 1 tablet (2.5 mg total) by mouth at bedtime.  ? OXcarbazepine (TRILEPTAL) 150 MG tablet Take 1 tablet (150 mg total) by mouth 2 (two) times daily.  ? Oxycodone HCl 10 MG TABS Take 1 tablet (10 mg total) by mouth every 6 (six) hours as needed.  ? [DISCONTINUED] albuterol (VENTOLIN HFA) 108 (90 Base) MCG/ACT inhaler Inhale 2 puffs into the lungs every 6 (six) hours as needed for wheezing or shortness of breath.  ? [DISCONTINUED] amLODipine (NORVASC) 5 MG tablet Take 5 mg by mouth every morning.  ? [DISCONTINUED] atorvastatin (LIPITOR) 80 MG tablet Take 80 mg by mouth at bedtime.  ? [DISCONTINUED] buPROPion (WELLBUTRIN XL) 300 MG 24 hr tablet Take 300 mg by mouth every morning.  ? [DISCONTINUED] clopidogrel (PLAVIX) 75 MG  tablet Take 1 tablet (75 mg total) by mouth daily.  ? [DISCONTINUED] empagliflozin (JARDIANCE) 25 MG TABS tablet Take 25 mg by mouth daily.  ? [DISCONTINUED] gabapentin (NEURONTIN) 300 MG capsule Take 300 mg by mo

## 2021-10-22 MED ORDER — CLOPIDOGREL BISULFATE 75 MG PO TABS: 75.0000 mg | ORAL_TABLET | Freq: Every day | ORAL | 0 refills | Status: AC

## 2021-10-22 MED ORDER — INSULIN ASPART PROT & ASPART (70-30 MIX) 100 UNIT/ML ~~LOC~~ SUSP: 40.0000 [IU] | Freq: Two times a day (BID) | SUBCUTANEOUS | 0 refills | Status: DC

## 2021-10-22 MED ORDER — ATORVASTATIN CALCIUM 80 MG PO TABS: 80.0000 mg | ORAL_TABLET | Freq: Every day | ORAL | 0 refills | Status: AC

## 2021-10-22 MED ORDER — METOPROLOL TARTRATE 50 MG PO TABS: 50.0000 mg | ORAL_TABLET | Freq: Two times a day (BID) | ORAL | 0 refills | Status: AC

## 2021-10-22 MED ORDER — EMPAGLIFLOZIN 25 MG PO TABS: 25.0000 mg | ORAL_TABLET | Freq: Every day | ORAL | 0 refills | Status: AC

## 2021-10-22 MED ORDER — BUPROPION HCL ER (XL) 300 MG PO TB24: 300.0000 mg | ORAL_TABLET | Freq: Every morning | ORAL | 0 refills | Status: AC

## 2021-10-22 MED ORDER — OXCARBAZEPINE 150 MG PO TABS: 150.0000 mg | ORAL_TABLET | Freq: Two times a day (BID) | ORAL | 0 refills | Status: AC

## 2021-10-22 MED ORDER — GABAPENTIN 300 MG PO CAPS: 300.0000 mg | ORAL_CAPSULE | Freq: Two times a day (BID) | ORAL | 0 refills | Status: DC

## 2021-10-22 MED ORDER — AMLODIPINE BESYLATE 5 MG PO TABS: 5.0000 mg | ORAL_TABLET | Freq: Every morning | ORAL | 0 refills | Status: AC

## 2021-10-22 MED ORDER — ALBUTEROL SULFATE HFA 108 (90 BASE) MCG/ACT IN AERS: 2.0000 | INHALATION_SPRAY | Freq: Four times a day (QID) | RESPIRATORY_TRACT | 0 refills | Status: DC | PRN

## 2021-10-22 MED ORDER — OMEPRAZOLE 20 MG PO CPDR: 20.0000 mg | DELAYED_RELEASE_CAPSULE | Freq: Every day | ORAL | 0 refills | Status: AC

## 2021-10-22 MED ORDER — OLANZAPINE 2.5 MG PO TABS: 2.5000 mg | ORAL_TABLET | Freq: Every day | ORAL | 0 refills | Status: AC

## 2021-10-22 MED ORDER — MECLIZINE HCL 25 MG PO TABS: 25.0000 mg | ORAL_TABLET | Freq: Three times a day (TID) | ORAL | 0 refills | Status: AC | PRN

## 2021-10-22 MED ORDER — LISINOPRIL 20 MG PO TABS: 20.0000 mg | ORAL_TABLET | Freq: Every day | ORAL | 0 refills | Status: DC

## 2021-10-22 MED ORDER — OXYCODONE HCL 10 MG PO TABS: 10.0000 mg | ORAL_TABLET | Freq: Four times a day (QID) | ORAL | 0 refills | Status: DC | PRN

## 2021-10-22 MED ORDER — LURASIDONE HCL 40 MG PO TABS: 40.0000 mg | ORAL_TABLET | Freq: Every day | ORAL | 0 refills | Status: AC

## 2021-10-23 ENCOUNTER — Encounter (HOSPITAL_BASED_OUTPATIENT_CLINIC_OR_DEPARTMENT_OTHER): Payer: Medicare HMO | Admitting: Internal Medicine

## 2021-10-23 ENCOUNTER — Other Ambulatory Visit: Payer: Self-pay | Admitting: Adult Health

## 2021-10-23 DIAGNOSIS — L97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 severity: Secondary | ICD-10-CM

## 2021-10-23 DIAGNOSIS — F333 Major depressive disorder, recurrent, severe with psychotic symptoms: Secondary | ICD-10-CM

## 2021-10-23 DIAGNOSIS — E1152 Type 2 diabetes mellitus with diabetic peripheral angiopathy with gangrene: Secondary | ICD-10-CM

## 2021-10-23 DIAGNOSIS — I10 Essential (primary) hypertension: Secondary | ICD-10-CM

## 2021-10-23 DIAGNOSIS — E1142 Type 2 diabetes mellitus with diabetic polyneuropathy: Secondary | ICD-10-CM

## 2021-10-23 DIAGNOSIS — E11621 Type 2 diabetes mellitus with foot ulcer: Secondary | ICD-10-CM | POA: Diagnosis not present

## 2021-10-23 DIAGNOSIS — J449 Chronic obstructive pulmonary disease, unspecified: Secondary | ICD-10-CM

## 2021-10-23 DIAGNOSIS — I739 Peripheral vascular disease, unspecified: Secondary | ICD-10-CM

## 2021-10-23 NOTE — Progress Notes (Signed)
YOMAR, MEJORADO A. ([id number]) ?Visit Report for 10/23/2021 ?Arrival Information Details ?Patient Name: Date of Service: ?[name], [name] ?Medical Record Number: [id number] ?Patient Account Number: [id number] ?Date of Birth/Sex: Treating RN: ?1955-12-18 (66 y.o. [name] ?Primary Care [name]: [name] Other Clinician: ?Referring [name]: ?Treating [name]/Extender: [name] ?[name] ?Weeks in Treatment: 1 ?Visit Information History Since Last Visit ?Added or deleted any medications: No ?Patient Arrived: Wheel Chair ?Any new allergies or adverse reactions: No ?Arrival Time: 09:07 ?Had a fall or experienced change in No ?Transfer Assistance: None ?activities of daily living that may affect ?Patient Identification Verified: Yes ?risk of falls: ?Secondary Verification Process Completed: Yes ?Signs or symptoms of abuse/neglect since No ?Patient Requires Transmission-Based Precautions: No last visito ?Patient Has Alerts: Yes ?Hospitalized since last visit: No ?Patient Alerts: Patient on Blood Thinner ?Implantable device outside of the clinic No ?09/14/21 ABI: R=.55 L=.29 ?excluding ?09/14/21 TBI=Absent cellular tissue based products placed in the ?center ?since last visit: ?Has Dressing in Place as Prescribed: Yes ?Has Footwear/Offloading in Place as Yes ?Prescribed: ?Right: Surgical Shoe with Pressure Relief Insole ?Pain Present Now: No ?Electronic Signature(s) ?Signed: 10/23/2021 4:55:52 PM By: [name] ?Entered By: [name] on 10/23/2021 09:11:38 ?-------------------------------------------------------------------------------- ?Clinic Level of Care Assessment Details ?Patient Name: Date of Service: ?[name], [name] ?Medical Record Number: [id number] ?Patient Account Number: [id number] ?Date of Birth/Sex: Treating RN: ?06-19-1956 (66 y.o. [name] ?Primary Care [name]: [name] Other Clinician: ?Referring  [name]: ?Treating [name]/Extender: [name] ?[name] ?Weeks in Treatment: 1 ?Clinic Level of Care Assessment Items ?TOOL 4 Quantity Score ?X- 1 0 ?Use when only an EandM is performed on FOLLOW-UP visit ?ASSESSMENTS - Nursing Assessment / Reassessment ?X- 1 10 ?Reassessment of Co-morbidities (includes updates in patient status) ?X- 1 5 ?Reassessment of Adherence to Treatment Plan ?ASSESSMENTS - Wound and Skin A ssessment / Reassessment ?X - Simple Wound Assessment / Reassessment - one wound 1 5 ?'[]'$  - 0 ?Complex Wound Assessment / Reassessment - multiple wounds ?'[]'$  - 0 ?Dermatologic / Skin Assessment (not related to wound area) ?ASSESSMENTS - Focused Assessment ?'[]'$  - 0 ?Circumferential Edema Measurements - multi extremities ?'[]'$  - 0 ?Nutritional Assessment / Counseling / Intervention ?'[]'$  - 0 ?Lower Extremity Assessment (monofilament, tuning fork, pulses) ?'[]'$  - 0 ?Peripheral Arterial Disease Assessment (using hand held doppler) ?ASSESSMENTS - Ostomy and/or Continence Assessment and Care ?'[]'$  - 0 ?Incontinence Assessment and Management ?'[]'$  - 0 ?Ostomy Care Assessment and Management (repouching, etc.) ?PROCESS - Coordination of Care ?'[]'$  - 0 ?Simple Patient / Family Education for ongoing care ?X- 1 20 ?Complex (extensive) Patient / Family Education for ongoing care ?X- 1 10 ?Staff obtains Consents, Records, T Results / Process Orders ?est ?X- 1 10 ?Staff telephones HHA, Nursing Homes / Clarify orders / etc ?'[]'$  - 0 ?Routine Transfer to another Facility (non-emergent condition) ?'[]'$  - 0 ?Routine Hospital Admission (non-emergent condition) ?'[]'$  - 0 ?New Admissions / Biomedical engineer / Ordering NPWT Apligraf, etc. ?, ?'[]'$  - 0 ?Emergency Hospital Admission (emergent condition) ?'[]'$  - 0 ?Simple Discharge Coordination ?'[]'$  - 0 ?Complex (extensive) Discharge Coordination ?PROCESS - Special Needs ?'[]'$  - 0 ?Pediatric / Minor Patient Management ?'[]'$  - 0 ?Isolation Patient Management ?'[]'$  - 0 ?Hearing / Language /  Visual special needs ?'[]'$  - 0 ?Assessment of Community assistance (transportation, D/C planning, etc.) ?'[]'$  - 0 ?Additional assistance / Altered mentation ?'[]'$  - 0 ?Support Surface(s) Assessment (bed, cushion,  seat, etc.) ?INTERVENTIONS - Wound Cleansing / Measurement ?X - Simple Wound Cleansing - one wound 1 5 ?'[]'$  - 0 ?Complex Wound Cleansing - multiple wounds ?X- 1 5 ?Wound Imaging (photographs - any number of wounds) ?'[]'$  - 0 ?Wound Tracing (instead of photographs) ?X- 1 5 ?Simple Wound Measurement - one wound ?'[]'$  - 0 ?Complex Wound Measurement - multiple wounds ?INTERVENTIONS - Wound Dressings ?'[]'$  - 0 ?Small Wound Dressing one or multiple wounds ?X- 1 15 ?Medium Wound Dressing one or multiple wounds ?'[]'$  - 0 ?Large Wound Dressing one or multiple wounds ?'[]'$  - 0 ?Application of Medications - topical ?'[]'$  - 0 ?Application of Medications - injection ?INTERVENTIONS - Miscellaneous ?'[]'$  - 0 ?External ear exam ?'[]'$  - 0 ?Specimen Collection (cultures, biopsies, blood, body fluids, etc.) ?'[]'$  - 0 ?Specimen(s) / Culture(s) sent or taken to Lab for analysis ?'[]'$  - 0 ?Patient Transfer (multiple staff / Civil Service fast streamer / Similar devices) ?'[]'$  - 0 ?Simple Staple / Suture removal (25 or less) ?'[]'$  - 0 ?Complex Staple / Suture removal (26 or more) ?'[]'$  - 0 ?Hypo / Hyperglycemic Management (close monitor of Blood Glucose) ?'[]'$  - 0 ?Ankle / Brachial Index (ABI) - do not check if billed separately ?X- 1 5 ?Vital Signs ?Has the patient been seen at the hospital within the last three years: Yes ?Total Score: 95 ?Level Of Care: New/Established - Level 3 ?Electronic Signature(s) ?Signed: 10/23/2021 4:55:52 PM By: [name] ?Entered By: [name] on 10/23/2021 10:04:47 ?-------------------------------------------------------------------------------- ?Encounter Discharge Information Details ?Patient Name: Date of Service: ?[name], [name] ?Medical Record Number: [id number] ?Patient Account Number: [id number] ?Date of  Birth/Sex: Treating RN: ?April 19, 1956 (66 y.o. [name] ?Primary Care Maysen Sudol: [name] Other Clinician: ?Referring Kmya Placide: ?Treating Takeyah Wieman/Extender: [name] ?[name] ?Weeks in Treatment: 1 ?Encounter Discharge Information Items ?Discharge Condition: Stable ?Ambulatory Status: Wheelchair ?Discharge Destination: [place] ?Orders Sent: Yes ?Transportation: Other ?Schedule Follow-up Appointment: Yes ?Clinical Summary of Care: Provided on 10/23/2021 ?Form Type Recipient ?Paper Patient Patient ?Electronic Signature(s) ?Signed: 10/23/2021 4:55:52 PM By: [name] ?Entered By: [name] on 10/23/2021 10:05:55 ?-------------------------------------------------------------------------------- ?Lower Extremity Assessment Details ?Patient Name: Date of Service: ?[name], [name] ?Medical Record Number: [id number] ?Patient Account Number: [id number] ?Date of Birth/Sex: Treating RN: ?[birth year] (66 y.o. [name] ?Primary Care Sanel Stemmer: [name] Other Clinician: ?Referring Timotheus Salm: ?Treating Francena Zender/Extender: [name] ?[name] ?Weeks in Treatment: 1 ?Edema Assessment ?Assessed: [Left: No] [Right: Yes] ?Edema: [Left: N] [Right: o] ?Calf ?Left: Right: ?Point of Measurement: 33 cm From Medial Instep 37 cm ?Ankle ?Left: Right: ?Point of Measurement: 10 cm From Medial Instep 24 cm ?Knee To Floor ?Left: Right: ?From Medial Instep 46 cm ?Vascular Assessment ?Pulses: ?Dorsalis Pedis ?Palpable: [Right:No] ?Electronic Signature(s) ?Signed: 10/23/2021 4:55:52 PM By: [name] ?Entered By: [name] on 10/23/2021 09:24:02 ?-------------------------------------------------------------------------------- ?Multi Wound Chart Details ?Patient Name: ?Date of Service: ?[name], [name] ?Medical Record Number: [id number] ?Patient Account Number: [id number] ?Date of Birth/Sex: ?Treating RN: ?Dec 09, 1955 (66  y.o. [name] ?Primary Care Brant Peets: [name] ?Other Clinician: ?Referring Elaine Roanhorse: ?Treating Caran Storck/Extender: [name] ?[name] ?Weeks in Treatment: 1 ?Vital Signs ?Height

## 2021-10-23 NOTE — Progress Notes (Signed)
[name], [name] A. ([id number]) ?Visit Report for 10/23/2021 ?Chief Complaint Document Details ?Patient Name: Date of Service: ?[name], [name] 10/23/2021 8:45 A M ?Medical Record Number: [id number] ?Patient Account Number: [id number] ?Date of Birth/Sex: Treating RN: ?[birth year] (66 y.o. [name] ?Primary Care Provider: York Ram Other Clinician: ?Referring Provider: ?Treating Provider/Extender: [name] ?York Ram ?Weeks in Treatment: 1 ?Information Obtained from: Patient ?Chief Complaint ?10/16/20; patient is here with a substantial wound on his right foot in the setting of a recent transmetatarsal amputation. ?Electronic Signature(s) ?Signed: 10/23/2021 1:32:24 PM By: [name] DO ?Entered By: [name] on 10/23/2021 10:40:29 ?-------------------------------------------------------------------------------- ?HPI Details ?Patient Name: Date of Service: ?[name], [name] 10/23/2021 8:45 A M ?Medical Record Number: [id number] ?Patient Account Number: [id number] ?Date of Birth/Sex: Treating RN: ?09/18/1955 (67 y.o. [name] ?Primary Care Provider: York Ram Other Clinician: ?Referring Provider: ?Treating Provider/Extender: [name] ?York Ram ?Weeks in Treatment: 1 ?History of Present Illness ?HPI Description: ADMISSION ?10/16/20 ?This is a 66 year old man who is a type II diabetic. Initially seen by Dr. Unk Lightning of vein and vascular on 09/10/2021 for bilateral lower extremity rest pain right ?greater than left. His ABIs demonstrated monophasic waveforms at the ankles bilaterally with depressed toe pressures. His ABI in the right was 0.5 and on the ?left 0.28. There were no obtainable waveforms for TBI's. He underwent angiography on 09/10/2021 and had findings of severe PAD with 95% stenosis of the ?distal SFA. He also had peroneal and posterior tibial arteries occluded with significant collateralization in the calf there was reconstitution of the  posterior tibial ?artery in the distal third of the calf. Ostia of the anterior tibial artery was not appreciated. He underwent a angioplasty of the superficial femoral artery. He ?required admission to hospital from 09/12/2021 through 09/22/2021 with right foot cellulitis and sepsis. On 3/13 he underwent a right below-knee popliteal to ?posterior tibial bypass using a greater saphenous vein. Ultimately however he required a right TMA by podiatry which I believe was done on 3/17 for ?progressive diabetic foot infection. He was discharged to [facility] skilled facility. ?He arrives in clinic today with a large wound area over the entirety of his amputation site extending proximally. The attempted flap closure of the amputation ?site and his TMA has failed and the wound extends under the attempt to closure. Still some sutures in place. There is an odor but he is not systemically unwell. ?He is due for follow-up arterial studies and has an appointment with vascular surgery on 10/28/2021. They are using wet-to-dry dressings at [facility]. ?Past medical history includes type 2 diabetes with peripheral neuropathy, hypertension, obstructive sleep apnea ?4/20; patient presents for follow-up. He is being discharged from his facility at [facility] today. They have been doing Dakin's wet-to-dry dressings. He states ?that his fiance can help with dressing changes. He is getting [facility] home [facility] to come out 3 times a week once he leaves the facility. He is scheduled to ?see vein and vascular on 5/12. He has not seen the wound himself. He puts covers over his face for wound exams. He currently denies systemic signs of ?infection. ?Electronic Signature(s) ?Signed: 10/23/2021 1:32:24 PM By: [name] DO ?Entered By: [name] on 10/23/2021 10:42:05 ?-------------------------------------------------------------------------------- ?Physical Exam Details ?Patient Name: Date of Service: ?[name], [name] 10/23/2021 8:45 A  M ?Medical Record Number: [id number] ?Patient Account Number: [id number] ?Date of Birth/Sex: Treating RN: ?Aug 07, 1955 (66 y.o. [name] ?Primary Care Provider: York Ram  Other Clinician: ?Referring Provider: ?Treating Provider/Extender: [name] ?York Ram ?Weeks in Treatment: 1 ?Constitutional ?respirations regular, non-labored and within target range for patient.Marland Kitchen ?Psychiatric ?pleasant and cooperative. ?Notes ?Right foot: TMA amputation with significant wound dehiscence. Some sutures in place however to the posterior aspect there is complete dehiscence of the ?previous incision site. Surface has granulation tissue and nonviable tissue. No obvious signs of infection. ?Electronic Signature(s) ?Signed: 10/23/2021 1:32:24 PM By: [name] DO ?Entered By: [name] on 10/23/2021 10:44:03 ?-------------------------------------------------------------------------------- ?Physician Orders Details ?Patient Name: Date of Service: ?[name], [name] 10/23/2021 8:45 A M ?Medical Record Number: [id number] ?Patient Account Number: [id number] ?Date of Birth/Sex: Treating RN: ?1956/06/05 (66 y.o. [name] ?Primary Care Provider: York Ram Other Clinician: ?Referring Provider: ?Treating Provider/Extender: [name] ?York Ram ?Weeks in Treatment: 1 ?Verbal / Phone Orders: No ?Diagnosis Coding ?ICD-10 Coding ?Code Description ?E11.621 Type 2 diabetes mellitus with foot ulcer ?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 severity ?E11.52 Type 2 diabetes mellitus with diabetic peripheral angiopathy with gangrene ?E11.42 Type 2 diabetes mellitus with diabetic polyneuropathy ?Follow-up Appointments ?ppointment in 1 week. - Friday with Dr. Heber Eastwood Leveda Anna, Room 7) ?Return A ?Other: - Keep appointment with Vascular on 10/28/21 ?Bathing/ Shower/ Hygiene ?May shower and wash wound with soap and water. - May wash with antibacterial  soap with dressing changes ?Off-Loading ?Open toe surgical shoe to: - to right foot: Reduce pressure to area as much as possible. ?Additional Orders / Instructions ?Follow Nutritious Diet - -High Protein Diet ?-Monitor/Control Blood Sugar ?Home Health ?Admit to [facility] for wound care. May utilize formulary equivalent dressing for wound treatment orders unless otherwise specified. ?Dressing changes to be completed by Home Health on Monday / Wednesday / Friday except when patient has scheduled visit at [facility]Care Center. ?Other Home Health Orders/Instructions: [facility] ?Wound Treatment ?Wound #1 - Amputation Site - Transmetatarsal Wound Laterality: Right ?Cleanser: Soap and Water (Home Health) 1 x Per Day/30 Days ?Discharge Instructions: May shower and wash wound with dial antibacterial soap and water prior to dressing change. ?Cleanser: Wound Cleanser (Home Health) 1 x Per Day/30 Days ?Discharge Instructions: Cleanse the wound with wound cleanser prior to applying a clean dressing using gauze sponges, not tissue or cotton balls. ?Topical: Dakins 1/4 Strength (Home Health) 1 x Per Day/30 Days ?Discharge Instructions: Moisten gauze with Dakins ?Secondary Dressing: ABD Pad, 8x10 (Home Health) 1 x Per Day/30 Days ?Discharge Instructions: Apply over primary dressing as directed. ?Secondary Dressing: Woven Gauze Sponge, Non-Sterile 4x4 in (Home Health) 1 x Per Day/30 Days ?Discharge Instructions: Moisten with Dakins and pack lightly into wound ?Secured With: Elastic Bandage 4 inch (ACE bandage) (Home Health) 1 x Per Day/30 Days ?Discharge Instructions: Secure with ACE bandage as directed. ?Secured With: [employer], 4.5x3.1 (in/yd) (Home Health) 1 x Per Day/30 Days ?Discharge Instructions: Secure with Kerlix as directed. ?Patient Medications ?llergies: gemfibrozil, OxyContin, pork derived (porcine), simvastatin ?A ?Notifications Medication Indication Start End ?10/23/2021 ?Dakin's Solution ?DOSE 1 -  miscellaneous 0.125 % solution - moisten gauze for wet to dry dressings ?Electronic Signature(s) ?Signed: 10/23/2021 1:32:24 PM By: [name] DO ?Previous Signature: 10/23/2021 10:11:20 AM Version By: Norm Parcel

## 2021-10-28 ENCOUNTER — Ambulatory Visit (INDEPENDENT_AMBULATORY_CARE_PROVIDER_SITE_OTHER): Payer: Medicare HMO | Admitting: Podiatry

## 2021-10-28 ENCOUNTER — Encounter: Payer: Self-pay | Admitting: Podiatry

## 2021-10-28 ENCOUNTER — Telehealth: Payer: Self-pay | Admitting: *Deleted

## 2021-10-28 DIAGNOSIS — Z9889 Other specified postprocedural states: Secondary | ICD-10-CM

## 2021-10-28 DIAGNOSIS — E1142 Type 2 diabetes mellitus with diabetic polyneuropathy: Secondary | ICD-10-CM

## 2021-10-28 MED ORDER — CEFADROXIL 500 MG PO CAPS: 500.0000 mg | ORAL_CAPSULE | Freq: Two times a day (BID) | ORAL | Status: DC

## 2021-10-28 MED ORDER — CEFADROXIL 500 MG PO CAPS: 500.0000 mg | ORAL_CAPSULE | Freq: Two times a day (BID) | ORAL | 0 refills | Status: AC

## 2021-10-28 NOTE — Telephone Encounter (Signed)
[name] [facility] [phone]), has completed OT home health evaluation,requesting verbal orders for 1 time a week for 1 week, 2 times weekly for 1 week, 1 time a week for 4 weeks. Please advise. ?

## 2021-10-28 NOTE — Progress Notes (Signed)
?Subjective:  ?Patient ID: [name], male    DOB: [birth year],  MRN: [id number] ? ?Chief Complaint  ?Patient presents with  ? Wound Check  ?  Right foot wound check   ? ? ?DOS: 09/19/21 ?Procedure: Right transmetatarsal amputation and right foot I&D ? ?66 y.o. male returns for POV#2. Doing well and having dressing changed daily at the facility. He has been following up with wound care and doing dakins WTD.  Relates pain is well controlled. Still unable to look at his foot.  ? ?Review of Systems: Negative except as noted in the HPI. Denies N/V/F/Ch. ? ?Past Medical History:  ?Diagnosis Date  ? Diabetes mellitus with peripheral vascular disease   ? Hypertension   ? Neuropathy   ? PTSD (post-traumatic stress disorder)   ? Sleep apnea   ? ? ?Current Outpatient Medications:  ?  albuterol (VENTOLIN HFA) 108 (90 Base) MCG/ACT inhaler, Inhale 2 puffs into the lungs every 6 (six) hours as needed for wheezing or shortness of breath., Disp: 1 each, Rfl: 0 ?  amLODipine (NORVASC) 5 MG tablet, Take 1 tablet (5 mg total) by mouth every morning., Disp: 30 tablet, Rfl: 0 ?  aspirin EC 81 MG tablet, Take 1 tablet (81 mg total) by mouth daily. Swallow whole., Disp: 150 tablet, Rfl: 2 ?  atorvastatin (LIPITOR) 80 MG tablet, Take 1 tablet (80 mg total) by mouth at bedtime., Disp: 30 tablet, Rfl: 0 ?  bisacodyl (DULCOLAX) 10 MG suppository, If not relieved by MOM, give 10 mg Bisacodyl suppositiory rectally X 1 dose in 24 hours as needed, Disp: , Rfl:  ?  buPROPion (WELLBUTRIN XL) 300 MG 24 hr tablet, Take 1 tablet (300 mg total) by mouth every morning., Disp: 30 tablet, Rfl: 0 ?  carboxymethylcellulose (REFRESH PLUS) 0.5 % SOLN, Place 1 drop into both eyes 4 (four) times daily as needed (dry eyes)., Disp: , Rfl:  ?  cholecalciferol (VITAMIN D) 25 MCG (1000 UNIT) tablet, Take 1,000 Units by mouth daily., Disp: , Rfl:  ?  clopidogrel (PLAVIX) 75 MG tablet, Take 1 tablet (75 mg total) by mouth daily., Disp: 30 tablet, Rfl: 0 ?   empagliflozin (JARDIANCE) 25 MG TABS tablet, Take 1 tablet (25 mg total) by mouth daily., Disp: 30 tablet, Rfl: 0 ?  fluticasone furoate-vilanterol (BREO ELLIPTA) 100-25 MCG/INH AEPB, Inhale 1 puff into the lungs daily., Disp: 60 each, Rfl: 5 ?  gabapentin (NEURONTIN) 300 MG capsule, Take 1 capsule (300 mg total) by mouth 2 (two) times daily., Disp: 30 capsule, Rfl: 0 ?  gabapentin (NEURONTIN) 600 MG tablet, Take 600 mg by mouth 2 (two) times daily as needed., Disp: , Rfl:  ?  insulin aspart protamine- aspart (NOVOLOG MIX 70/30) (70-30) 100 UNIT/ML injection, Inject 0.4 mLs (40 Units total) into the skin 2 (two) times daily with a meal., Disp: 10 mL, Rfl: 0 ?  lisinopril (ZESTRIL) 20 MG tablet, Take 1 tablet (20 mg total) by mouth daily., Disp: 30 tablet, Rfl: 0 ?  lurasidone (LATUDA) 40 MG TABS tablet, Take 1 tablet (40 mg total) by mouth daily with supper., Disp: 30 tablet, Rfl: 0 ?  Magnesium Hydroxide (MILK OF MAGNESIA PO), If no BM in 3 days, give 30 cc Milk of Magnesium p.o. x 1 dose in 24 hours as needed (Do not use standing constipation orders for residents with renal failure CFR less than 30. Contact MD for orders), Disp: , Rfl:  ?  meclizine (ANTIVERT) 25 MG tablet, Take 1 tablet (  25 mg total) by mouth 3 (three) times daily as needed for dizziness., Disp: 30 tablet, Rfl: 0 ?  metoprolol tartrate (LOPRESSOR) 50 MG tablet, Take 1 tablet (50 mg total) by mouth 2 (two) times daily., Disp: 60 tablet, Rfl: 0 ?  NON FORMULARY, Heart healthy LCS diet, Disp: , Rfl:  ?  OLANZapine (ZYPREXA) 2.5 MG tablet, Take 1 tablet (2.5 mg total) by mouth at bedtime., Disp: 30 tablet, Rfl: 0 ?  omeprazole (PRILOSEC) 20 MG capsule, Take 1 capsule (20 mg total) by mouth at bedtime., Disp: 30 capsule, Rfl: 0 ?  OXcarbazepine (TRILEPTAL) 150 MG tablet, Take 1 tablet (150 mg total) by mouth 2 (two) times daily., Disp: 60 tablet, Rfl: 0 ?  Oxycodone HCl 10 MG TABS, Take 1 tablet (10 mg total) by mouth every 6 (six) hours as needed.,  Disp: 30 tablet, Rfl: 0 ?  Sodium Phosphates (RA SALINE ENEMA RE), If not relieved by Biscodyl suppository, give disposable Saline Enema rectally X 1 dose/24 hrs as needed, Disp: , Rfl:  ? ?Social History  ? ?Tobacco Use  ?Smoking Status Former  ? Types: Cigarettes  ?Smokeless Tobacco Never  ? ? ?Allergies  ?Allergen Reactions  ? Gemfibrozil Other (See Comments)  ?  Doesn't know its been awhile  ? Oxycontin [Oxycodone Hcl] Other (See Comments)  ?  Bad reaction "took me to a place I never wanna go again"  ? Pork-Derived Products   ? Simvastatin Other (See Comments)  ?  Says he doesn't know its been awhile  ? ?Objective:  ?There were no vitals filed for this visit. ?There is no height or weight on file to calculate BMI. ?Constitutional Well developed. ?Well nourished.  ?Vascular Foot warm and well perfused. ?Capillary refill normal to all digits.   ?Neurologic Normal speech. ?Oriented to person, place, and time. ?Epicritic sensation to light touch grossly present bilaterally.  ?Dermatologic Skin incisions plantarly have dehisced and distal flap loose with some fibrous tissue and discharge.  . Large dorsal foot wound with noted improvement of granular tissue starting to cover metatarsals some necrotic tissue noted to the edges of wound. Proixmally and distally.  Malodor noted  ?Orthopedic: Tenderness to palpation noted about the surgical site.  ? ?Radiographs: interval loss of distal forefoot with resection at transmetatarsal level.  ?Assessment:  ? ?1. Post-operative state   ?2. Type 2 diabetes mellitus with peripheral neuropathy (HCC)   ? ? ?Plan:  ?Patient was evaluated and treated and all questions answered. ? ?S/p foot surgery right ?-Progressing as expected post-operatively. ?-WB Status: Heel WBAT in surgical shoe ?-Sutures: intact. ?-Continue with daily WTD dressing changes. Seeing wound care weekly.  ?Debridement of wound preformed today to healthy bleeding tissue.  ?-Medications: continue course of cefadroxil.  Refill x 10 days.  ?-Foot redressed. ?-Will refer to wound care to aid with dorsal wound closure.  ?Discussed if any worsening pain swelling, redness, fever nausea or vomiting to report to the emergency room.  ?Return in 3 week ? ?Return in about 3 weeks (around 11/18/2021) for post op.  ? ?

## 2021-10-29 NOTE — Telephone Encounter (Signed)
Called [facility] and spoke with Selinda Eon, giving verbal orders 1 time a week for 4 weeks per Dr Loraine Grip understanding and stated that she will notate in patient's chart.

## 2021-10-30 ENCOUNTER — Other Ambulatory Visit: Payer: Self-pay | Admitting: *Deleted

## 2021-10-30 DIAGNOSIS — I70223 Atherosclerosis of native arteries of extremities with rest pain, bilateral legs: Secondary | ICD-10-CM

## 2021-10-31 ENCOUNTER — Encounter (HOSPITAL_BASED_OUTPATIENT_CLINIC_OR_DEPARTMENT_OTHER): Payer: Medicare HMO | Admitting: Internal Medicine

## 2021-10-31 DIAGNOSIS — E11621 Type 2 diabetes mellitus with foot ulcer: Secondary | ICD-10-CM | POA: Diagnosis not present

## 2021-11-05 ENCOUNTER — Other Ambulatory Visit: Payer: Self-pay

## 2021-11-05 ENCOUNTER — Emergency Department (HOSPITAL_COMMUNITY): Payer: Medicare HMO

## 2021-11-05 ENCOUNTER — Emergency Department (HOSPITAL_COMMUNITY)
Admission: EM | Admit: 2021-11-05 | Discharge: 2021-11-05 | Disposition: A | Discharge status: Home / Self Care | Payer: Medicare HMO | Attending: Emergency Medicine | Admitting: Emergency Medicine

## 2021-11-05 ENCOUNTER — Encounter (HOSPITAL_COMMUNITY): Payer: Self-pay

## 2021-11-05 DIAGNOSIS — M79671 Pain in right foot: Secondary | ICD-10-CM | POA: Insufficient documentation

## 2021-11-05 DIAGNOSIS — Z794 Long term (current) use of insulin: Secondary | ICD-10-CM | POA: Diagnosis not present

## 2021-11-05 DIAGNOSIS — R55 Syncope and collapse: Secondary | ICD-10-CM | POA: Diagnosis not present

## 2021-11-05 DIAGNOSIS — R42 Dizziness and giddiness: Secondary | ICD-10-CM | POA: Diagnosis not present

## 2021-11-05 DIAGNOSIS — R5383 Other fatigue: Secondary | ICD-10-CM | POA: Insufficient documentation

## 2021-11-05 DIAGNOSIS — Z7982 Long term (current) use of aspirin: Secondary | ICD-10-CM | POA: Diagnosis not present

## 2021-11-05 LAB — CBC WITH DIFFERENTIAL/PLATELET
Abs Immature Granulocytes: 0.05 10*3/uL (ref 0.00–0.07)
Basophils Absolute: 0.1 10*3/uL (ref 0.0–0.1)
Basophils Relative: 1 %
Eosinophils Absolute: 0.3 10*3/uL (ref 0.0–0.5)
Eosinophils Relative: 3 %
HCT: 42.2 % (ref 39.0–52.0)
Hemoglobin: 13.4 g/dL (ref 13.0–17.0)
Immature Granulocytes: 0 %
Lymphocytes Relative: 16 %
Lymphs Abs: 1.8 10*3/uL (ref 0.7–4.0)
MCH: 27.1 pg (ref 26.0–34.0)
MCHC: 31.8 g/dL (ref 30.0–36.0)
MCV: 85.4 fL (ref 80.0–100.0)
Monocytes Absolute: 1 10*3/uL (ref 0.1–1.0)
Monocytes Relative: 9 %
Neutro Abs: 8.3 10*3/uL — ABNORMAL HIGH (ref 1.7–7.7)
Neutrophils Relative %: 71 %
Platelets: 732 10*3/uL — ABNORMAL HIGH (ref 150–400)
RBC: 4.94 MIL/uL (ref 4.22–5.81)
RDW: 14.1 % (ref 11.5–15.5)
WBC: 11.6 10*3/uL — ABNORMAL HIGH (ref 4.0–10.5)
nRBC: 0 % (ref 0.0–0.2)

## 2021-11-05 LAB — URINALYSIS, ROUTINE W REFLEX MICROSCOPIC
Bacteria, UA: NONE SEEN
Bilirubin Urine: NEGATIVE
Glucose, UA: >=500 mg/dL — AB
Hgb urine dipstick: NEGATIVE
Ketones, ur: NEGATIVE mg/dL
Leukocytes,Ua: NEGATIVE
Nitrite: NEGATIVE
Protein, ur: NEGATIVE mg/dL
Specific Gravity, Urine: 1.031 — ABNORMAL HIGH (ref 1.005–1.030)
pH: 5 (ref 5.0–8.0)

## 2021-11-05 LAB — COMPREHENSIVE METABOLIC PANEL
ALT: 16 U/L (ref 0–44)
AST: 12 U/L — ABNORMAL LOW (ref 15–41)
Albumin: 3.8 g/dL (ref 3.5–5.0)
Alkaline Phosphatase: 79 U/L (ref 38–126)
Anion gap: 9 (ref 5–15)
BUN: 19 mg/dL (ref 8–23)
CO2: 25 mmol/L (ref 22–32)
Calcium: 9.4 mg/dL (ref 8.9–10.3)
Chloride: 102 mmol/L (ref 98–111)
Creatinine, Ser: 0.98 mg/dL (ref 0.61–1.24)
GFR, Estimated: >60 mL/min (ref >60)
Glucose, Bld: 110 mg/dL — ABNORMAL HIGH (ref 70–99)
Potassium: 4.1 mmol/L (ref 3.5–5.1)
Sodium: 136 mmol/L (ref 135–145)
Total Bilirubin: 0.4 mg/dL (ref 0.3–1.2)
Total Protein: 9.1 g/dL — ABNORMAL HIGH (ref 6.5–8.1)

## 2021-11-05 LAB — TROPONIN I (HIGH SENSITIVITY)
Troponin I (High Sensitivity): 5 ng/L (ref <18)
Troponin I (High Sensitivity): 3 ng/L (ref <18)

## 2021-11-05 LAB — CBG MONITORING, ED: Glucose-Capillary: 108 mg/dL — ABNORMAL HIGH (ref 70–99)

## 2021-11-05 LAB — GLUCOSE, CAPILLARY: Glucose-Capillary: 140 mg/dL — ABNORMAL HIGH (ref 70–99)

## 2021-11-05 MED ORDER — SODIUM CHLORIDE 0.9 % IV BOLUS
500.0000 mL | Freq: Once | INTRAVENOUS | Status: AC
  Administered 2021-11-05: 500 mL via INTRAVENOUS

## 2021-11-05 MED ORDER — ACETAMINOPHEN 325 MG PO TABS
650.0000 mg | ORAL_TABLET | Freq: Once | ORAL | Status: AC
  Administered 2021-11-05: 650 mg via ORAL
  Filled 2021-11-05: qty 2

## 2021-11-05 NOTE — ED Triage Notes (Signed)
Pt BIB EMS from wound clinic. Per staff pt "passed out in lobby" Pt showed up to appointment today but was told appt was on 5/4. Pt c/o right foot pain states had all toes amputated in March. A&o x 4  ?

## 2021-11-05 NOTE — Discharge Instructions (Signed)
Return for any problem.  ?

## 2021-11-05 NOTE — ED Provider Notes (Signed)
?[facility] DEPT ?Provider Note ? ? ?CSN: [id number] ?Arrival date & time: 11/05/21  1157 ? ?  ? ?History ? ?Chief Complaint  ?Patient presents with  ? Near Syncope  ? ? ?[name] is a 66 y.o. male. ? ?66 year old male with prior medical history as detailed below presents for evaluation.  Patient reports that he presented at the wound clinic today for what he thought was an appointment.  He did not realize until later that he was at the clinic on the wrong day.  As he was in the lobby of the wound clinic he began to feel lightheaded.  He sat down and then apparently passed out.  His period of syncope was very brief. ? 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 ? ?Patient denies associated chest pain or shortness of breath.  Patient denies palpitations.  Patient reports that he had to walk into the clinic from his vehicle.  He felt very fatigued upon arriving to the lobby. ? ?He denies recent illness or fever.   ? ?The history is provided by the patient and medical records.  ?Loss of Consciousness ?Episode history:  Single ?Most recent episode:  Today ?Timing:  Rare ?Progression:  Resolved ?Chronicity:  New ? ?  ? ?Home Medications ?Prior to Admission medications   ?Medication Sig Start Date End Date Taking? Authorizing Provider  ?albuterol (VENTOLIN HFA) 108 (90 Base) MCG/ACT inhaler Inhale 2 puffs into the lungs every 6 (six) hours as needed for wheezing or shortness of breath. 10/22/21   Medina-Vargas, Monina C, NP  ?amLODipine (NORVASC) 5 MG tablet Take 1 tablet (5 mg total) by mouth every morning. 10/22/21   Medina-Vargas, Monina C, NP  ?aspirin EC 81 MG tablet Take 1 tablet (81 mg total) by mouth daily. Swallow whole. 09/10/21 09/10/22  [name], MD  ?atorvastatin (LIPITOR) 80 MG tablet Take 1 tablet (80 mg total) by mouth at bedtime. 10/22/21   Medina-Vargas, Monina C, NP  ?bisacodyl (DULCOLAX) 10 MG suppository If not  relieved by MOM, give 10 mg Bisacodyl suppositiory rectally X 1 dose in 24 hours as needed    [provider]  ?buPROPion (WELLBUTRIN XL) 300 MG 24 hr tablet Take 1 tablet (300 mg total) by mouth every morning. 10/22/21   Medina-Vargas, Monina C, NP  ?carboxymethylcellulose (REFRESH PLUS) 0.5 % SOLN Place 1 drop into both eyes 4 (four) times daily as needed (dry eyes).    [provider]  ?cefadroxil (DURICEF) 500 MG capsule Take 1 capsule (500 mg total) by mouth 2 (two) times daily for 10 days. 10/28/21 11/07/21  Lorenda Peck, DPM  ?cholecalciferol (VITAMIN D) 25 MCG (1000 UNIT) tablet Take 1,000 Units by mouth daily.    [provider]  ?clopidogrel (PLAVIX) 75 MG tablet Take 1 tablet (75 mg total) by mouth daily. 10/22/21 11/21/21  Medina-Vargas, Monina C, NP  ?empagliflozin (JARDIANCE) 25 MG TABS tablet Take 1 tablet (25 mg total) by mouth daily. 10/22/21   Medina-Vargas, Monina C, NP  ?fluticasone furoate-vilanterol (BREO ELLIPTA) 100-25 MCG/INH AEPB Inhale 1 puff into the lungs daily. 02/11/21   Chesley Mires, MD  ?gabapentin (NEURONTIN) 300 MG capsule Take 1 capsule (300 mg total) by mouth 2 (two) times daily. 10/22/21   Medina-Vargas, Monina C, NP  ?gabapentin (NEURONTIN) 600 MG tablet Take 600 mg by mouth 2 (two) times daily as needed. 02/21/21   [provider]  ?insulin aspart protamine-  aspart (NOVOLOG MIX 70/30) (70-30) 100 UNIT/ML injection Inject 0.4 mLs (40 Units total) into the skin 2 (two) times daily with a meal. 10/22/21   Medina-Vargas, Monina C, NP  ?lisinopril (ZESTRIL) 20 MG tablet Take 1 tablet (20 mg total) by mouth daily. 10/22/21 11/21/21  Medina-Vargas, Monina C, NP  ?lurasidone (LATUDA) 40 MG TABS tablet Take 1 tablet (40 mg total) by mouth daily with supper. 10/22/21   Medina-Vargas, Senaida Lange, NP  ?Magnesium Hydroxide (MILK OF MAGNESIA PO) If no BM in 3 days, give 30 cc Milk of Magnesium p.o. x 1 dose in 24 hours as needed (Do not use standing constipation orders  for residents with renal failure CFR less than 30. Contact MD for orders)    [provider]  ?meclizine (ANTIVERT) 25 MG tablet Take 1 tablet (25 mg total) by mouth 3 (three) times daily as needed for dizziness. 10/22/21   Medina-Vargas, Monina C, NP  ?metoprolol tartrate (LOPRESSOR) 50 MG tablet Take 1 tablet (50 mg total) by mouth 2 (two) times daily. 10/22/21   Medina-Vargas, Monina C, NP  ?NON FORMULARY Heart healthy LCS diet    [provider]  ?OLANZapine (ZYPREXA) 2.5 MG tablet Take 1 tablet (2.5 mg total) by mouth at bedtime. 10/22/21   Medina-Vargas, Monina C, NP  ?omeprazole (PRILOSEC) 20 MG capsule Take 1 capsule (20 mg total) by mouth at bedtime. 10/22/21   Medina-Vargas, Senaida Lange, NP  ?OXcarbazepine (TRILEPTAL) 150 MG tablet Take 1 tablet (150 mg total) by mouth 2 (two) times daily. 10/22/21   Medina-Vargas, Senaida Lange, NP  ?Oxycodone HCl 10 MG TABS Take 1 tablet (10 mg total) by mouth every 6 (six) hours as needed. 10/22/21   Medina-Vargas, Senaida Lange, NP  ?Sodium Phosphates (RA SALINE ENEMA RE) If not relieved by Biscodyl suppository, give disposable Saline Enema rectally X 1 dose/24 hrs as needed    [provider]  ?   ? ?Allergies    ?Gemfibrozil, Oxycontin [oxycodone hcl], Pork-derived products, and Simvastatin   ? ?Review of Systems   ?Review of Systems  ?Cardiovascular:  Positive for syncope.  ?All other systems reviewed and are negative. ? ?Physical Exam ?Updated Vital Signs ?BP (!) 151/72   Pulse 63   Temp 99 ?F (37.2 ?C) (Oral)   Resp 15   Ht 6' (1.829 m)   Wt 98.4 kg   SpO2 96%   BMI 29.43 kg/m?  ?Physical Exam ?Vitals and nursing note reviewed.  ?Constitutional:   ?   General: He is not in acute distress. ?   Appearance: Normal appearance. He is well-developed.  ?HENT:  ?   Head: Normocephalic and atraumatic.  ?Eyes:  ?   Conjunctiva/sclera: Conjunctivae normal.  ?   Pupils: Pupils are equal, round, and reactive to light.  ?Cardiovascular:  ?   Rate and Rhythm:  Normal rate and regular rhythm.  ?   Heart sounds: Normal heart sounds.  ?Pulmonary:  ?   Effort: Pulmonary effort is normal. No respiratory distress.  ?   Breath sounds: Normal breath sounds.  ?Abdominal:  ?   General: There is no distension.  ?   Palpations: Abdomen is soft.  ?   Tenderness: There is no abdominal tenderness.  ?Musculoskeletal:     ?   General: No deformity. Normal range of motion.  ?   Cervical back: Normal range of motion and neck supple.  ?   Comments: Wound to right foot is CDI sp amputations  ?Skin: ?  General: Skin is warm and dry.  ?Neurological:  ?   General: No focal deficit present.  ?   Mental Status: He is alert and oriented to person, place, and time.  ? ? ?ED Results / Procedures / Treatments   ?Labs ?(all labs ordered are listed, but only abnormal results are displayed) ?Labs Reviewed  ?COMPREHENSIVE METABOLIC PANEL - Abnormal; Notable for the following components:  ?    Result Value  ? Glucose, Bld 110 (*)   ? Total Protein 9.1 (*)   ? AST 12 (*)   ? All other components within normal limits  ?CBC WITH DIFFERENTIAL/PLATELET - Abnormal; Notable for the following components:  ? WBC 11.6 (*)   ? Platelets 732 (*)   ? Neutro Abs 8.3 (*)   ? All other components within normal limits  ?CBG MONITORING, ED - Abnormal; Notable for the following components:  ? Glucose-Capillary 108 (*)   ? All other components within normal limits  ?URINALYSIS, ROUTINE W REFLEX MICROSCOPIC  ?TROPONIN I (HIGH SENSITIVITY)  ?TROPONIN I (HIGH SENSITIVITY)  ? ? ?EKG ?EKG Interpretation ? ?Date/Time:  Wednesday Nov 05 2021 16:20:24 EDT ?Ventricular Rate:  68 ?PR Interval:  190 ?QRS Duration: 98 ?QT Interval:  428 ?QTC Calculation: 456 ?R Axis:   8 ?Text Interpretation: Sinus rhythm Baseline wander in lead(s) V2 Confirmed by Dene Gentry [phone]) on 11/05/2021 4:49:42 PM ?EKG - 11/05/2021 12:14 - NSR, Rate 72, No ischemic changes ? ? ? ? ? ? ?Radiology ?CT Head Wo Contrast ? ?Result Date: 11/05/2021 ?CLINICAL DATA:   Syncope. EXAM: CT HEAD WITHOUT CONTRAST TECHNIQUE: Contiguous axial images were obtained from the base of the skull through the vertex without intravenous contrast. RADIATION DOSE REDUCTION: This exam was perform

## 2021-11-06 ENCOUNTER — Other Ambulatory Visit: Payer: Self-pay

## 2021-11-06 ENCOUNTER — Encounter (HOSPITAL_BASED_OUTPATIENT_CLINIC_OR_DEPARTMENT_OTHER): Payer: Medicare HMO | Attending: Internal Medicine | Admitting: Internal Medicine

## 2021-11-06 DIAGNOSIS — L97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 severity: Secondary | ICD-10-CM | POA: Insufficient documentation

## 2021-11-06 DIAGNOSIS — E1151 Type 2 diabetes mellitus with diabetic peripheral angiopathy without gangrene: Secondary | ICD-10-CM | POA: Diagnosis not present

## 2021-11-06 DIAGNOSIS — E11621 Type 2 diabetes mellitus with foot ulcer: Secondary | ICD-10-CM | POA: Insufficient documentation

## 2021-11-06 DIAGNOSIS — M869 Osteomyelitis, unspecified: Secondary | ICD-10-CM | POA: Diagnosis not present

## 2021-11-06 DIAGNOSIS — E1169 Type 2 diabetes mellitus with other specified complication: Secondary | ICD-10-CM | POA: Insufficient documentation

## 2021-11-06 DIAGNOSIS — Y835 Amputation of limb(s) as the cause of abnormal reaction of the patient, or of later complication, without mention of misadventure at the time of the procedure: Secondary | ICD-10-CM | POA: Insufficient documentation

## 2021-11-06 DIAGNOSIS — G4733 Obstructive sleep apnea (adult) (pediatric): Secondary | ICD-10-CM | POA: Insufficient documentation

## 2021-11-06 DIAGNOSIS — E1142 Type 2 diabetes mellitus with diabetic polyneuropathy: Secondary | ICD-10-CM | POA: Insufficient documentation

## 2021-11-06 DIAGNOSIS — T8781 Dehiscence of amputation stump: Secondary | ICD-10-CM | POA: Diagnosis not present

## 2021-11-06 DIAGNOSIS — I10 Essential (primary) hypertension: Secondary | ICD-10-CM | POA: Diagnosis not present

## 2021-11-06 NOTE — Progress Notes (Signed)
DUAN, SCHARNHORST A. ([id number]) ?Visit Report for 11/06/2021 ?Arrival Information Details ?Patient Name: Date of Service: ?[name], [name] 11/06/2021 10:15 A M ?Medical Record Number: [id number] ?Patient Account Number: [id number] ?Date of Birth/Sex: Treating RN: ?09-09-1955 (66 y.o. [name] ?Primary Care Future Yeldell: York Ram Other Clinician: ?Referring Leroi Haque: ?Treating Henchy Mccauley/Extender: [name] ?York Ram ?Weeks in Treatment: 3 ?Visit Information History Since Last Visit ?Added or deleted any medications: Yes ?Patient Arrived: Wheel Chair ?Any new allergies or adverse reactions: No ?Arrival Time: 10:17 ?Had a fall or experienced change in No ?Transfer Assistance: Manual ?activities of daily living that may affect ?Patient Identification Verified: Yes ?risk of falls: ?Secondary Verification Process Completed: Yes ?Signs or symptoms of abuse/neglect since No ?Patient Requires Transmission-Based Precautions: No last visito ?Patient Has Alerts: Yes ?Hospitalized since last visit: No ?Patient Alerts: Patient on Blood Thinner ?Implantable device outside of the clinic No ?09/14/21 ABI: R=.55 L=.29 ?excluding ?09/14/21 TBI=Absent cellular tissue based products placed in the ?center ?since last visit: ?Has Dressing in Place as Prescribed: Yes ?Has Footwear/Offloading in Place as Yes ?Prescribed: ?Right: Surgical Shoe with Pressure Relief Insole ?Pain Present Now: Yes ?Electronic Signature(s) ?Signed: 11/06/2021 4:49:55 PM By: Lorrin Jackson ?Entered By: Lorrin Jackson on 11/06/2021 10:22:54 ?-------------------------------------------------------------------------------- ?Encounter Discharge Information Details ?Patient Name: Date of Service: ?[name], [name] 11/06/2021 10:15 A M ?Medical Record Number: [id number] ?Patient Account Number: [id number] ?Date of Birth/Sex: Treating RN: ?07/22/1955 (66 y.o. [name] ?Primary Care Shamyah Stantz: York Ram Other Clinician: ?Referring  Josselyne Onofrio: ?Treating Desani Sprung/Extender: [name] ?York Ram ?Weeks in Treatment: 3 ?Encounter Discharge Information Items ?Discharge Condition: Stable ?Ambulatory Status: Wheelchair ?Discharge Destination: Home ?Transportation: Other ?Schedule Follow-up Appointment: Yes ?Clinical Summary of Care: Provided on 11/06/2021 ?Form Type Recipient ?Paper Patient Patient ?Electronic Signature(s) ?Signed: 11/06/2021 11:23:27 AM By: Lorrin Jackson ?Entered By: Lorrin Jackson on 11/06/2021 11:23:26 ?-------------------------------------------------------------------------------- ?Lower Extremity Assessment Details ?Patient Name: ?Date of Service: ?[name], [name] 11/06/2021 10:15 A M ?Medical Record Number: [id number] ?Patient Account Number: [id number] ?Date of Birth/Sex: ?Treating RN: ?21-Jan-1956 (66 y.o. [name] ?Primary Care Asli Tokarski: York Ram ?Other Clinician: ?Referring Zelie Asbill: ?Treating Oather Muilenburg/Extender: [name] ?York Ram ?Weeks in Treatment: 3 ?Edema Assessment ?Assessed: [Left: No] [Right: Yes] ?Edema: [Left: Ye] [Right: s] ?Calf ?Left: Right: ?Point of Measurement: 33 cm From Medial Instep 41.4 cm ?Ankle ?Left: Right: ?Point of Measurement: 10 cm From Medial Instep 25 cm ?Vascular Assessment ?Pulses: ?Dorsalis Pedis ?Palpable: [Right:No] ?Electronic Signature(s) ?Signed: 11/06/2021 4:49:55 PM By: Lorrin Jackson ?Entered By: Lorrin Jackson on 11/06/2021 10:24:55 ?-------------------------------------------------------------------------------- ?Multi Wound Chart Details ?Patient Name: ?Date of Service: ?[name], [name] 11/06/2021 10:15 A M ?Medical Record Number: [id number] ?Patient Account Number: [id number] ?Date of Birth/Sex: ?Treating RN: ?February 16, 1956 (66 y.o. [name] ?Primary Care Jesenia Spera: York Ram ?Other Clinician: ?Referring Trasean Delima: ?Treating Ramiah Helfrich/Extender: [name] ?York Ram ?Weeks in Treatment: 3 ?Vital Signs ?Height(in):  72 ?Pulse(bpm): 67 ?Weight(lbs): 216 ?Blood Pressure(mmHg): 135/71 ?Body Mass Index(BMI): 29.3 ?Temperature(??F): 99.5 ?Respiratory Rate(breaths/min): 18 ?Photos: [1:Right Amputation Site -] [N/A:N/A N/A] ?Wound Location: [1:Transmetatarsal Surgical Injury] [N/A:N/A] ?Wounding Event: [1:Dehisced Wound] [N/A:N/A] ?Primary Etiology: [1:Asthma, Sleep Apnea, Hypertension,] [N/A:N/A] ?Comorbid History: [1:Peripheral Venous Disease, Type II Diabetes, Osteomyelitis, Neuropathy 09/19/2021] [N/A:N/A] ?Date Acquired: [1:3] [N/A:N/A] ?Weeks of Treatment: [1:Open] [N/A:N/A] ?Wound Status: [1:No] [N/A:N/A] ?Wound Recurrence: [1:12.8x6.5x3] [N/A:N/A] ?Measurements L x W x D (cm) [1:65.345] [N/A:N/A] ?A (cm?) : ?rea [1:196.035] [N/A:N/A] ?Volume (cm?) : [1:34.70%] [N/A:N/A] ?% Reduction in Area: [1:30.00%] [N/A:N/A] ?%  Reduction in Volume: [1:Full Thickness With Exposed Support] [N/A:N/A] ?Classification: [1:Structures Large] [N/A:N/A] ?Exudate A mount: [1:Purulent] [N/A:N/A] ?Exudate Type: [1:yellow, brown, green] [N/A:N/A] ?Exudate Color: [1:Yes] [N/A:N/A] ?Foul Odor A Cleansing: [1:fter No] [N/A:N/A] ?Odor Anticipated Due to Product ?Use: [1:Well defined, not attached] [N/A:N/A] ?Wound Margin: [1:Medium (34-66%)] [N/A:N/A] ?Granulation A mount: [1:Red] [N/A:N/A] ?Granulation Quality: [1:Medium (34-66%)] [N/A:N/A] ?Necrotic Amount: [1:Eschar, Adherent Slough] [N/A:N/A] ?Necrotic Tissue: ?[1:Fat Layer (Subcutaneous Tissue): Yes N/A] ?Exposed Structures: ?[1:Muscle: Yes Bone: Yes Fascia: No Tendon: No Joint: No Small (1-33%)] [N/A:N/A] ?Epithelialization: [1:Debridement - Excisional] [N/A:N/A] ?Debridement: ?Pre-procedure Verification/Time Out 10:49 [N/A:N/A] ?Taken: [1:Other] [N/A:N/A] ?Pain Control: [1:Bone] [N/A:N/A] ?Tissue Debrided: [1:Skin/Subcutaneous] [N/A:N/A] ?Level: [1:Tissue/Muscle/Bone 1] [N/A:N/A] ?Debridement A (sq cm): [1:rea Rongeur] [N/A:N/A] ?Instrument: [1:Moderate] [N/A:N/A] ?Bleeding: [1:Pressure]  [N/A:N/A] ?Hemostasis Achieved: ?Debridement Treatment Response: Procedure was tolerated well [N/A:N/A] ?Post Debridement Measurements L x 12.8x6.5x3 [N/A:N/A] ?W x D (cm) [1:196.035] [N/A:N/A] ?Post Debridement Volume: (cm?) [1:Debridement] [N/A:N/A] ?Treatment Notes ?Wound #1 (Amputation Site - Transmetatarsal) Wound Laterality: Right ?Cleanser ?Soap and Water ?Discharge Instruction: May shower and wash wound with dial antibacterial soap and water prior to dressing change. ?Wound Cleanser ?Discharge Instruction: Cleanse the wound with wound cleanser prior to applying a clean dressing using gauze sponges, not tissue or cotton balls. ?Peri-Wound Care ?Topical ?Primary Dressing ?Dakin's Solution 0.25%, 16 (oz) ?Discharge Instruction: Moisten gauze with Dakin's solution ?Secondary Dressing ?ABD Pad, 8x10 ?Discharge Instruction: Apply over primary dressing as directed. ?Woven Gauze Sponge, Non-Sterile 4x4 in ?Discharge Instruction: Moisten with Dakins and pack lightly into wound ?Secured With ?Elastic Bandage 4 inch (ACE bandage) ?Discharge Instruction: Secure with ACE bandage as directed. ?Kerlix Roll Sterile, 4.5x3.1 (in/yd) ?Discharge Instruction: Secure with Kerlix as directed. ?Compression Wrap ?Compression Stockings ?Add-Ons ?Electronic Signature(s) ?Signed: 11/06/2021 11:49:25 AM By: [name] DO ?Signed: 11/06/2021 4:49:55 PM By: Lorrin Jackson ?Entered By: [name] on 11/06/2021 11:31:02 ?-------------------------------------------------------------------------------- ?Multi-Disciplinary Care Plan Details ?Patient Name: ?Date of Service: ?[name], [name] 11/06/2021 10:15 A M ?Medical Record Number: [id number] ?Patient Account Number: [id number] ?Date of Birth/Sex: ?Treating RN: ?1955-10-26 (66 y.o. [name] ?Primary Care Praveen Coia: York Ram ?Other Clinician: ?Referring Sharma Lawrance: ?Treating Versia Mignogna/Extender: [name] ?York Ram ?Weeks in Treatment: 3 ?Active  Inactive ?Wound/Skin Impairment ?Nursing Diagnoses: ?Impaired tissue integrity ?Goals: ?Patient/caregiver will verbalize understanding of skin care regimen ?Date Initiated: 10/16/2021 ?Target Resolution Date: 11/13/2021 ?Goal Stat

## 2021-11-06 NOTE — Progress Notes (Signed)
[name], [name] A. ([id number]) ?Visit Report for 11/06/2021 ?Chief Complaint Document Details ?Patient Name: Date of Service: ?[name], [name] 11/06/2021 10:15 A M ?Medical Record Number: [id number] ?Patient Account Number: [id number] ?Date of Birth/Sex: Treating RN: ?06-Dec-1955 (66 y.o. [name] ?Primary Care Provider: York [name] Other Clinician: ?Referring Provider: ?Treating Provider/Extender: [name] ?[name] ?Weeks in Treatment: 3 ?Information Obtained from: Patient ?Chief Complaint ?10/16/20; patient is here with a substantial wound on his right foot in the setting of a recent transmetatarsal amputation. ?Electronic Signature(s) ?Signed: 11/06/2021 11:49:25 AM By: [name] Shan DO ?Entered By: [name] on 11/06/2021 11:31:21 ?-------------------------------------------------------------------------------- ?Debridement Details ?Patient Name: Date of Service: ?[name], [name] 11/06/2021 10:15 A M ?Medical Record Number: [id number] ?Patient Account Number: [id number] ?Date of Birth/Sex: Treating RN: ?Feb 15, 1956 (66 y.o. [name] ?Primary Care Provider: York [name] Other Clinician: ?Referring Provider: ?Treating Provider/Extender: [name] ?[name] ?Weeks in Treatment: 3 ?Debridement Performed for Assessment: Wound #1 Right Amputation Site - Transmetatarsal ?Performed By: Physician [name] Shan, DO ?Debridement Type: Debridement ?Level of Consciousness (Pre-procedure): Awake and Alert ?Pre-procedure Verification/Time Out Yes - 10:49 ?Taken: ?Start Time: 10:50 ?Pain Control: ?Other : Benzocaine ?T Area Debrided (L x W): ?otal 1 (cm) x 1 (cm) = 1 (cm?) ?Tissue and other material debrided: Non-Viable, Bone ?Level: Skin/Subcutaneous Tissue/Muscle/Bone ?Debridement Description: Excisional ?Instrument: Rongeur ?Specimen: Tissue Culture ?Number of Specimens T aken: 1 ?Bleeding: Moderate ?Hemostasis Achieved: Pressure ?End Time: 10:55 ?Response  to Treatment: Procedure was tolerated well ?Level of Consciousness (Post- Awake and Alert ?procedure): ?Post Debridement Measurements of Total Wound ?Length: (cm) 12.8 ?Width: (cm) 6.5 ?Depth: (cm) 3 ?Volume: (cm?) 196.035 ?Character of Wound/Ulcer Post Debridement: Requires Further Debridement ?Post Procedure Diagnosis ?Same as Pre-procedure ?Electronic Signature(s) ?Signed: 11/06/2021 11:29:35 AM By: [name] ?Signed: 11/06/2021 11:49:25 AM By: [name] Shan DO ?Entered By: [name] on 11/06/2021 11:29:35 ?-------------------------------------------------------------------------------- ?HPI Details ?Patient Name: Date of Service: ?[name], [name] 11/06/2021 10:15 A M ?Medical Record Number: [id number] ?Patient Account Number: [id number] ?Date of Birth/Sex: Treating RN: ?Dec 26, 1955 (66 y.o. [name] ?Primary Care Provider: York [name] Other Clinician: ?Referring Provider: ?Treating Provider/Extender: [name] ?[name] ?Weeks in Treatment: 3 ?History of Present Illness ?HPI Description: ADMISSION ?10/16/20 ?This is a 66 year old man who is a type II diabetic. Initially seen by Dr. Unk Lightning of vein and vascular on 09/10/2021 for bilateral lower extremity rest pain right ?greater than left. His ABIs demonstrated monophasic waveforms at the ankles bilaterally with depressed toe pressures. His ABI in the right was 0.5 and on the ?left 0.28. There were no obtainable waveforms for TBI's. He underwent angiography on 09/10/2021 and had findings of severe PAD with 95% stenosis of the ?distal SFA. He also had peroneal and posterior tibial arteries occluded with significant collateralization in the calf there was reconstitution of the posterior tibial ?artery in the distal third of the calf. [place] of the anterior tibial artery was not appreciated. He underwent a angioplasty of the superficial femoral artery. He ?required admission to hospital from 09/12/2021 through 09/22/2021 with right foot  cellulitis and sepsis. On 3/13 he underwent a right below-knee popliteal to ?posterior tibial bypass using a greater saphenous vein. Ultimately however he required a right TMA by podiatry which I believe was done on 3/17 for ?progressive diabetic foot infection. He was discharged to [facility] skilled facility. ?He arrives in clinic today with a large wound area over the entirety of his amputation site extending proximally. The  attempted flap closure of the amputation ?site and his TMA has failed and the wound extends under the attempt to closure. Still some sutures in place. There is an odor but he is not systemically unwell. ?He is due for follow-up arterial studies and has an appointment with vascular surgery on 10/28/2021. They are using wet-to-dry dressings at [facility]. ?Past medical history includes type 2 diabetes with peripheral neuropathy, hypertension, obstructive sleep apnea ?4/20; patient presents for follow-up. He is being discharged from his facility at [facility] today. They have been doing Dakin's wet-to-dry dressings. He states ?that his fiance can help with dressing changes. He is getting [facility] to come out 3 times a week once he leaves the facility. He is scheduled to ?see vein and vascular on 5/12. He has not seen the wound himself. He puts covers over his face for wound exams. He currently denies systemic signs of ?infection. ?4/28; patient admitted to the clinic 2 weeks ago with a dehisced right TMA in the setting of type 2 diabetes with significant PAD. He is status post ?revascularization. He has been discharged from the nursing home he was at and now is at home using Dakin's wet-to-dry dressings daily he has home health. ?He denies any systemic signs of infection ?5/4; patient presents for follow-up. His fiance and home health have been changing his dressings. He currently denies systemic signs of infection. He ?continues to put sheet covers over his face during our wound  encounters because he does not want a look at the wound. ?Electronic Signature(s) ?Signed: 11/06/2021 11:49:25 AM By: [name] Shan DO ?Entered By: [name] on 11/06/2021 11:33:50 ?-------------------------------------------------------------------------------- ?Physical Exam Details ?Patient Name: Date of Service: ?[name], [name] 11/06/2021 10:15 A M ?Medical Record Number: [id number] ?Patient Account Number: [id number] ?Date of Birth/Sex: Treating RN: ?[birth year] (66 y.o. [name] ?Primary Care Provider: York [name] Other Clinician: ?Referring Provider: ?Treating Provider/Extender: [name] ?[name] ?Weeks in Treatment: 3 ?Constitutional ?respirations regular, non-labored and within target range for patient.Marland Kitchen ?Psychiatric ?pleasant and cooperative. ?Notes ?Right foot with transmetatarsal amputation with large wound dehiscence. There is a flap of tissue inferiorly that undermines. There is exposed bone that ?appears to be the first and third metatarsal. Tissue present is mostly granulation tissue with some nonviable tissue. No surrounding soft tissue infection. ?Electronic Signature(s) ?Signed: 11/06/2021 11:49:25 AM By: [name] Shan DO ?Entered By: [name] on 11/06/2021 11:36:40 ?-------------------------------------------------------------------------------- ?Physician Orders Details ?Patient Name: Date of Service: ?[name], [name] 11/06/2021 10:15 A M ?Medical Record Number: [id number] ?Patient Account Number: [id number] ?Date of Birth/Sex: Treating RN: ?09-25-1955 (66 y.o. [name] ?Primary Care Provider: York [name] Other Clinician: ?Referring Provider: ?Treating Provider/Extender: [name] ?[name] ?Weeks in Treatment: 3 ?Verbal / Phone Orders: No ?Diagnosis Coding ?ICD-10 Coding ?Code Description ?E11.621 Type 2 diabetes mellitus with foot ulcer ?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  specified severity ?E11.52 Type 2 diabetes mellitus with diabetic peripheral angiopathy with gangrene ?E11.42 Type 2 diabetes mellitus with diabetic polyneuropathy ?Follow-up Appointments ?ppointment in 1 week. - 5

## 2021-11-07 DIAGNOSIS — T8781 Dehiscence of amputation stump: Secondary | ICD-10-CM | POA: Diagnosis not present

## 2021-11-11 NOTE — Progress Notes (Signed)
BERTHOLD, GLACE A. ([id number]) ?Visit Report for 10/31/2021 ?HPI Details ?Patient Name: Date of Service: ?[name], [name] 10/31/2021 2:30 PM ?Medical Record Number: [id number] ?Patient Account Number: [id number] ?Date of Birth/Sex: Treating RN: ?10/19/1955 (66 y.o. [name] ?Primary Care Provider: York Ram Other Clinician: ?Referring Provider: ?Treating Provider/Extender: Linton Ham ?York Ram ?Weeks in Treatment: 2 ?History of Present Illness ?HPI Description: ADMISSION ?10/16/20 ?This is a 66 year old man who is a type II diabetic. Initially seen by Dr. Unk Lightning of vein and vascular on 09/10/2021 for bilateral lower extremity rest pain right ?greater than left. His ABIs demonstrated monophasic waveforms at the ankles bilaterally with depressed toe pressures. His ABI in the right was 0.5 and on the ?left 0.28. There were no obtainable waveforms for TBI's. He underwent angiography on 09/10/2021 and had findings of severe PAD with 95% stenosis of the ?distal SFA. He also had peroneal and posterior tibial arteries occluded with significant collateralization in the calf there was reconstitution of the posterior tibial ?artery in the distal third of the calf. [place] of the anterior tibial artery was not appreciated. He underwent a angioplasty of the superficial femoral artery. He ?required admission to hospital from 09/12/2021 through 09/22/2021 with right foot cellulitis and sepsis. On 3/13 he underwent a right below-knee popliteal to ?posterior tibial bypass using a greater saphenous vein. Ultimately however he required a right TMA by podiatry which I believe was done on 3/17 for ?progressive diabetic foot infection. He was discharged to [facility] skilled facility. ?He arrives in clinic today with a large wound area over the entirety of his amputation site extending proximally. The attempted flap closure of the amputation ?site and his TMA has failed and the wound extends under the attempt to  closure. Still some sutures in place. There is an odor but he is not systemically unwell. ?He is due for follow-up arterial studies and has an appointment with vascular surgery on 10/28/2021. They are using wet-to-dry dressings at [facility] ?Past medical history includes type 2 diabetes with peripheral neuropathy, hypertension, obstructive sleep apnea ?4/20; patient presents for follow-up. He is being discharged from his facility at [facility] today. They have been doing Dakin's wet-to-dry dressings. He states ?that his fiance can help with dressing changes. He is getting [facility] home [facility] to come out 3 times a week once he leaves the facility. He is scheduled to ?see vein and vascular on 5/12. He has not seen the wound himself. He puts covers over his face for wound exams. He currently denies systemic signs of ?infection. ?4/28; patient admitted to the clinic 2 weeks ago with a dehisced right TMA in the setting of type 2 diabetes with significant PAD. He is status post ?revascularization. He has been discharged from the nursing home he was at and now is at home using Dakin's wet-to-dry dressings daily he has home health. ?He denies any systemic signs of infection ?Electronic Signature(s) ?Signed: 10/31/2021 4:01:11 PM By: Linton Ham MD ?Entered By: Linton Ham on 10/31/2021 15:36:41 ?-------------------------------------------------------------------------------- ?Physical Exam Details ?Patient Name: Date of Service: ?[name], [name] 10/31/2021 2:30 PM ?Medical Record Number: [id number] ?Patient Account Number: [id number] ?Date of Birth/Sex: Treating RN: ?1956/01/23 (66 y.o. [name] ?Primary Care Provider: York Ram Other Clinician: ?Referring Provider: ?Treating Provider/Extender: Linton Ham ?York Ram ?Weeks in Treatment: 2 ?Constitutional ?Sitting or standing Blood Pressure is within target range for patient.. Pulse regular and within target range for patient.Marland Kitchen Respirations  regular, non-labored and ?within target range.. Temperature  is normal and within the target range for the patient.Marland Kitchen Appears in no distress. ?Cardiovascular ?Surprisingly posterior tibial artery pulses palpable his forefoot is warm.Marland Kitchen ?Notes ?Wound exam; right foot TMA site. Large wound dehiscence. Flaps of tissue inferiorly the wound probes widely under these. There is too wide areas of exposed ?bone which I think are the first and third metatarsals. Versus 2 weeks ago when I saw this patient first the surface of the wound has less necrotic tissue but ?there is been little evidence of "healing". No overt progressive infection. He says his pain is at 5 out of 10 ?The wound was vigorously washed out with wound cleanser and gauze. I removed several 5 or 6 sutures that were not holding anything together in particular ?Electronic Signature(s) ?Signed: 10/31/2021 4:01:11 PM By: Linton Ham MD ?Entered By: Linton Ham on 10/31/2021 15:40:20 ?-------------------------------------------------------------------------------- ?Physician Orders Details ?Patient Name: ?Date of Service: ?[name], [name] 10/31/2021 2:30 PM ?Medical Record Number: [id number] ?Patient Account Number: [id number] ?Date of Birth/Sex: ?Treating RN: ?1955-07-15 (66 y.o. [name] ?Primary Care Provider: York Ram ?Other Clinician: ?Referring Provider: ?Treating Provider/Extender: Linton Ham ?York Ram ?Weeks in Treatment: 2 ?Verbal / Phone Orders: No ?Diagnosis Coding ?ICD-10 Coding ?Code Description ?E11.621 Type 2 diabetes mellitus with foot ulcer ?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 severity ?E11.52 Type 2 diabetes mellitus with diabetic peripheral angiopathy with gangrene ?E11.42 Type 2 diabetes mellitus with diabetic polyneuropathy ?Follow-up Appointments ?ppointment in 1 week. - Friday with Dr. Heber Rivereno Leveda Anna, Room 7) ?Return A ?Bathing/ Shower/ Hygiene ?May shower and  wash wound with soap and water. - May wash with antibacterial soap with dressing changes ?Off-Loading ?Open toe surgical shoe to: - to right foot: Reduce pressure to area as much as possible. ?Additional Orders / Instructions ?Follow Nutritious Diet - -High Protein Diet ?-Monitor/Control Blood Sugar ?Home Health ?Admit to [facility] for wound care. May utilize formulary equivalent dressing for wound treatment orders unless otherwise specified. ?Dressing changes to be completed by Home Health on Monday / Wednesday / Friday except when patient has scheduled visit at [facility]. ?Other Home Health Orders/Instructions: [facility] ?Wound Treatment ?Wound #1 - Amputation Site - Transmetatarsal Wound Laterality: Right ?Cleanser: Soap and Water 1 x Per Day/30 Days ?Discharge Instructions: May shower and wash wound with dial antibacterial soap and water prior to dressing change. ?Cleanser: Wound Cleanser 1 x Per Day/30 Days ?Discharge Instructions: Cleanse the wound with wound cleanser prior to applying a clean dressing using gauze sponges, not tissue or cotton balls. ?Prim Dressing: Dakin's Solution 0.25%, 16 (oz) (DME) (Generic) 1 x Per Day/30 Days ?ary ?Discharge Instructions: Moisten gauze with Dakin's solution ?Secondary Dressing: ABD Pad, 8x10 1 x Per Day/30 Days ?Discharge Instructions: Apply over primary dressing as directed. ?Secondary Dressing: Woven Gauze Sponge, Non-Sterile 4x4 in 1 x Per Day/30 Days ?Discharge Instructions: Moisten with Dakins and pack lightly into wound ?Secured With: Elastic Bandage 4 inch (ACE bandage) 1 x Per Day/30 Days ?Discharge Instructions: Secure with ACE bandage as directed. ?Secured With: [employer], 4.5x3.1 (in/yd) 1 x Per Day/30 Days ?Discharge Instructions: Secure with Kerlix as directed. ?Electronic Signature(s) ?Signed: 10/31/2021 4:52:29 PM By: [name] RN, [name] ?Signed: 10/31/2021 5:37:59 PM By: Linton Ham MD ?Previous Signature: 10/31/2021 4:01:11 PM  Version By: Linton Ham MD ?Entered By: [name] on 10/31/2021 16:05:02 ?-------------------------------------------------------------------------------- ?Problem List Details ?Patient Name: ?

## 2021-11-11 NOTE — Progress Notes (Signed)
KENJI, MAPEL A. ([id number]) ?Visit Report for 10/31/2021 ?Arrival Information Details ?Patient Name: Date of Service: ?[name], [name] 10/31/2021 2:30 PM ?Medical Record Number: [id number] ?Patient Account Number: [id number] ?Date of Birth/Sex: Treating RN: ?[birth year] (66 y.o. [name] ?Primary Care Diyana Starrett: [facility] Ram Other Clinician: ?Referring Nykayla Marcelli: ?Treating Haniah Penny/Extender: Linton Ham ?[facility] Ram ?Weeks in Treatment: 2 ?Visit Information History Since Last Visit ?Added or deleted any medications: No ?Patient Arrived: [name] ?Any new allergies or adverse reactions: No ?Arrival Time: 14:59 ?Had a fall or experienced change in No ?Accompanied By: self ?activities of daily living that may affect ?Transfer Assistance: None ?risk of falls: ?Patient Identification Verified: Yes ?Signs or symptoms of abuse/neglect since No ?Secondary Verification Process Completed: Yes last visito ?Patient Requires Transmission-Based Precautions: No ?Hospitalized since last visit: No ?Patient Has Alerts: Yes ?Implantable device outside of the clinic No ?Patient Alerts: Patient on Blood Thinner excluding ?09/14/21 ABI: R=.55 L=.29 cellular tissue based products placed in the ?09/14/21 TBI=Absent center ?since last visit: ?Has Dressing in Place as Prescribed: Yes ?Has Footwear/Offloading in Place as Yes ?Prescribed: ?Left: Surgical Shoe with Pressure Relief Insole ?Pain Present Now: Yes ?Electronic Signature(s) ?Signed: 11/11/2021 8:25:23 AM By: Sharyn Creamer RN, [name] ?Entered By: Sharyn Creamer on 10/31/2021 15:00:55 ?-------------------------------------------------------------------------------- ?Encounter Discharge Information Details ?Patient Name: Date of Service: ?[name], [name] 10/31/2021 2:30 PM ?Medical Record Number: [id number] ?Patient Account Number: [id number] ?Date of Birth/Sex: Treating RN: ?08/23/1955 (66 y.o. [name] ?Primary Care Crayton Savarese: [facility] Ram Other  Clinician: ?Referring Harless Molinari: ?Treating Marcelle Bebout/Extender: Linton Ham ?[facility] Ram ?Weeks in Treatment: 2 ?Encounter Discharge Information Items ?Discharge Condition: Stable ?Ambulatory Status: [name] ?Discharge Destination: Home ?Transportation: Private Auto ?Accompanied By: self ?Schedule Follow-up Appointment: Yes ?Clinical Summary of Care: Patient Declined ?Electronic Signature(s) ?Signed: 11/11/2021 8:25:23 AM By: Sharyn Creamer RN, [name] ?Entered By: Sharyn Creamer on 10/31/2021 16:17:37 ?-------------------------------------------------------------------------------- ?Lower Extremity Assessment Details ?Patient Name: ?Date of Service: ?[name], [name] 10/31/2021 2:30 PM ?Medical Record Number: [id number] ?Patient Account Number: [id number] ?Date of Birth/Sex: ?Treating RN: ?[birth year] (66 y.o. [name] ?Primary Care Landry Lookingbill: [facility] Ram ?Other Clinician: ?Referring Oda Placke: ?Treating Marissa Weaver/Extender: Linton Ham ?[facility] Ram ?Weeks in Treatment: 2 ?Edema Assessment ?Assessed: [Left: No] [Right: Yes] ?Edema: [Left: Ye] [Right: s] ?Calf ?Left: Right: ?Point of Measurement: 33 cm From Medial Instep 41.6 cm ?Ankle ?Left: Right: ?Point of Measurement: 10 cm From Medial Instep 25.4 cm ?Electronic Signature(s) ?Signed: 11/11/2021 8:25:23 AM By: Sharyn Creamer RN, [name] ?Entered By: Sharyn Creamer on 10/31/2021 15:13:17 ?-------------------------------------------------------------------------------- ?Multi Wound Chart Details ?Patient Name: ?Date of Service: ?[name], [name] 10/31/2021 2:30 PM ?Medical Record Number: [id number] ?Patient Account Number: [id number] ?Date of Birth/Sex: ?Treating RN: ?[birth year] (66 y.o. [name] ?Primary Care Albino Bufford: [facility] Ram ?Other Clinician: ?Referring Jadon Harbaugh: ?Treating Blythe Veach/Extender: Linton Ham ?[facility] Ram ?Weeks in Treatment: 2 ?Vital Signs ?Height(in): 72 ?Pulse(bpm): 76 ?Weight(lbs): 216 ?Blood Pressure(mmHg):  127/76 ?Body Mass Index(BMI): 29.3 ?Temperature(??F): 98.6 ?Respiratory Rate(breaths/min): 18 ?Photos: [N/A:N/A] ?Right Amputation Site - N/A N/A ?Wound Location: ?Transmetatarsal ?Surgical Injury N/A N/A ?Wounding Event: ?Dehisced Wound N/A N/A ?Primary Etiology: ?Asthma, Sleep Apnea, Hypertension, N/A N/A ?Comorbid History: ?Peripheral Venous Disease, Type II ?Diabetes, Osteomyelitis, Neuropathy ?09/19/2021 N/A N/A ?Date Acquired: ?2 N/A N/A ?Weeks of Treatment: ?Open N/A N/A ?Wound Status: ?No N/A N/A ?Wound Recurrence: ?10x7x2.5 N/A N/A ?Measurements L x W x D (cm) ?54.978 N/A N/A ?A (cm?) : ?rea ?137.445 N/A N/A ?Volume (cm?) : ?45.10% N/A N/A ?%  Reduction in Area: ?50.90% N/A N/A ?% Reduction in Volume: ?Full Thickness With Exposed Support N/A N/A ?Classification: ?Structures ?Large N/A N/A ?Exudate A mount: ?Purulent N/A N/A ?Exudate Type: ?yellow, brown, green N/A N/A ?Exudate Color: ?Well defined, not attached N/A N/A ?Wound Margin: ?Medium (34-66%) N/A N/A ?Granulation Amount: ?Red N/A N/A ?Granulation Quality: ?Medium (34-66%) N/A N/A ?Necrotic Amount: ?Eschar, Adherent Slough N/A N/A ?Necrotic Tissue: ?Fat Layer (Subcutaneous Tissue): Yes N/A N/A ?Exposed Structures: ?Muscle: Yes ?Bone: Yes ?Fascia: No ?Tendon: No ?Joint: No ?Small (1-33%) N/A N/A ?Epithelialization: ?sutures x4 intact. odor noted to wound. N/A N/A ?Assessment Notes: ?Treatment Notes ?Electronic Signature(s) ?Signed: 10/31/2021 4:01:11 PM By: Linton Ham MD ?Signed: 11/03/2021 5:13:39 PM By: Lorrin Jackson ?Entered By: Linton Ham on 10/31/2021 15:35:26 ?-------------------------------------------------------------------------------- ?Multi-Disciplinary Care Plan Details ?Patient Name: ?Date of Service: ?[name], [name] 10/31/2021 2:30 PM ?Medical Record Number: [id number] ?Patient Account Number: [id number] ?Date of Birth/Sex: ?Treating RN: ?[birth year] (66 y.o. [name] ?Primary Care Mckinna Demars: [facility] Ram ?Other  Clinician: ?Referring Kaedan Richert: ?Treating Tristina Sahagian/Extender: Linton Ham ?[facility] Ram ?Weeks in Treatment: 2 ?Active Inactive ?Wound/Skin Impairment ?Nursing Diagnoses: ?Impaired tissue integrity ?Goals: ?Patient/caregiver will verbalize understanding of skin care regimen ?Date Initiated: 10/16/2021 ?Target Resolution Date: 11/13/2021 ?Goal Status: Active ?Ulcer/skin breakdown will have a volume reduction of 30% by week 4 ?Date Initiated: 10/16/2021 ?Target Resolution Date: 11/13/2021 ?Goal Status: Active ?Interventions: ?Assess patient/caregiver ability to obtain necessary supplies ?Assess patient/caregiver ability to perform ulcer/skin care regimen upon admission and as needed ?Assess ulceration(s) every visit ?Provide education on ulcer and skin care ?Treatment Activities: ?Topical wound management initiated : 10/16/2021 ?Notes: ?Electronic Signature(s) ?Signed: 11/11/2021 8:25:23 AM By: Sharyn Creamer RN, [name] ?Entered By: Sharyn Creamer on 10/31/2021 15:28:44 ?-------------------------------------------------------------------------------- ?Pain Assessment Details ?Patient Name: ?Date of Service: ?[name], [name] 10/31/2021 2:30 PM ?Medical Record Number: [id number] ?Patient Account Number: [id number] ?Date of Birth/Sex: ?Treating RN: ?March 10, 1956 (66 y.o. [name] ?Primary Care Raylin Diguglielmo: [facility] Ram ?Other Clinician: ?Referring Johntavius Shepard: ?Treating Rayshon Albaugh/Extender: Linton Ham ?[facility] Ram ?Weeks in Treatment: 2 ?Active Problems ?Location of Pain Severity and Description of Pain ?Patient Has Paino Yes ?Site Locations ?Duration of the Pain. ?Constant / Intermittento Intermittent ?Rate the pain. ?Current Pain Level: 3 ?Worst Pain Level: 7 ?Least Pain Level: 2 ?Tolerable Pain Level: 4 ?Character of Pain ?Describe the Pain: Burning ?Pain Management and Medication ?Current Pain Management: ?Medication: Yes ?Electronic Signature(s) ?Signed: 11/11/2021 8:25:23 AM By: Sharyn Creamer RN,  [name] ?Entered By: Sharyn Creamer on 10/31/2021 15:04:35 ?-------------------------------------------------------------------------------- ?Patient/Caregiver Education Details ?Patient Name: ?Date of Service: ?[name],

## 2021-11-12 LAB — AEROBIC/ANAEROBIC CULTURE W GRAM STAIN (SURGICAL/DEEP WOUND): Gram Stain: NONE SEEN

## 2021-11-13 ENCOUNTER — Encounter (HOSPITAL_BASED_OUTPATIENT_CLINIC_OR_DEPARTMENT_OTHER): Payer: Medicare HMO | Admitting: Internal Medicine

## 2021-11-14 ENCOUNTER — Ambulatory Visit: Payer: Medicare HMO

## 2021-11-14 ENCOUNTER — Ambulatory Visit (HOSPITAL_COMMUNITY)
Admission: RE | Admit: 2021-11-14 | Discharge: 2021-11-14 | Disposition: A | Discharge status: Home / Self Care | Payer: Medicare HMO | Source: Ambulatory Visit | Attending: Vascular Surgery | Admitting: Vascular Surgery

## 2021-11-14 ENCOUNTER — Ambulatory Visit (INDEPENDENT_AMBULATORY_CARE_PROVIDER_SITE_OTHER)
Admission: RE | Admit: 2021-11-14 | Discharge: 2021-11-14 | Disposition: A | Discharge status: Home / Self Care | Payer: Medicare HMO | Source: Ambulatory Visit | Attending: Vascular Surgery | Admitting: Vascular Surgery

## 2021-11-14 DIAGNOSIS — I70223 Atherosclerosis of native arteries of extremities with rest pain, bilateral legs: Secondary | ICD-10-CM | POA: Diagnosis not present

## 2021-11-19 ENCOUNTER — Encounter: Payer: Self-pay | Admitting: Podiatry

## 2021-11-19 ENCOUNTER — Ambulatory Visit (INDEPENDENT_AMBULATORY_CARE_PROVIDER_SITE_OTHER): Payer: Medicare HMO | Admitting: Podiatry

## 2021-11-19 DIAGNOSIS — Z9889 Other specified postprocedural states: Secondary | ICD-10-CM

## 2021-11-19 DIAGNOSIS — E1142 Type 2 diabetes mellitus with diabetic polyneuropathy: Secondary | ICD-10-CM

## 2021-11-19 NOTE — Progress Notes (Signed)
?Subjective:  ?Patient ID: [name], male    DOB: 06-08-1956,  MRN: [id number] ? ?No chief complaint on file. ? ? ?DOS: 09/19/21 ?Procedure: Right transmetatarsal amputation and right foot I&D ? ?66 y.o. male returns for POV#3. Doing well and having dressing changed daily at the facility. He has been following up with wound care and doing dakins WTD. Finished the course of antibiotics.   Relates pain is well controlled. Has been able to look at the wound.  ? ?Review of Systems: Negative except as noted in the HPI. Denies N/V/F/Ch. ? ?Past Medical History:  ?Diagnosis Date  ? Diabetes mellitus with peripheral vascular disease   ? Hypertension   ? Neuropathy   ? PTSD (post-traumatic stress disorder)   ? Sleep apnea   ? ? ?Current Outpatient Medications:  ?  albuterol (VENTOLIN HFA) 108 (90 Base) MCG/ACT inhaler, Inhale 2 puffs into the lungs every 6 (six) hours as needed for wheezing or shortness of breath., Disp: 1 each, Rfl: 0 ?  amLODipine (NORVASC) 5 MG tablet, Take 1 tablet (5 mg total) by mouth every morning., Disp: 30 tablet, Rfl: 0 ?  aspirin EC 81 MG tablet, Take 1 tablet (81 mg total) by mouth daily. Swallow whole., Disp: 150 tablet, Rfl: 2 ?  atorvastatin (LIPITOR) 80 MG tablet, Take 1 tablet (80 mg total) by mouth at bedtime., Disp: 30 tablet, Rfl: 0 ?  bisacodyl (DULCOLAX) 10 MG suppository, If not relieved by MOM, give 10 mg Bisacodyl suppositiory rectally X 1 dose in 24 hours as needed, Disp: , Rfl:  ?  buPROPion (WELLBUTRIN XL) 300 MG 24 hr tablet, Take 1 tablet (300 mg total) by mouth every morning., Disp: 30 tablet, Rfl: 0 ?  carboxymethylcellulose (REFRESH PLUS) 0.5 % SOLN, Place 1 drop into both eyes 4 (four) times daily as needed (dry eyes)., Disp: , Rfl:  ?  cholecalciferol (VITAMIN D) 25 MCG (1000 UNIT) tablet, Take 1,000 Units by mouth daily., Disp: , Rfl:  ?  clopidogrel (PLAVIX) 75 MG tablet, Take 1 tablet (75 mg total) by mouth daily., Disp: 30 tablet, Rfl: 0 ?  empagliflozin  (JARDIANCE) 25 MG TABS tablet, Take 1 tablet (25 mg total) by mouth daily., Disp: 30 tablet, Rfl: 0 ?  fluticasone furoate-vilanterol (BREO ELLIPTA) 100-25 MCG/INH AEPB, Inhale 1 puff into the lungs daily., Disp: 60 each, Rfl: 5 ?  gabapentin (NEURONTIN) 300 MG capsule, Take 1 capsule (300 mg total) by mouth 2 (two) times daily., Disp: 30 capsule, Rfl: 0 ?  gabapentin (NEURONTIN) 600 MG tablet, Take 600 mg by mouth 2 (two) times daily as needed., Disp: , Rfl:  ?  insulin aspart protamine- aspart (NOVOLOG MIX 70/30) (70-30) 100 UNIT/ML injection, Inject 0.4 mLs (40 Units total) into the skin 2 (two) times daily with a meal., Disp: 10 mL, Rfl: 0 ?  lisinopril (ZESTRIL) 20 MG tablet, Take 1 tablet (20 mg total) by mouth daily., Disp: 30 tablet, Rfl: 0 ?  lurasidone (LATUDA) 40 MG TABS tablet, Take 1 tablet (40 mg total) by mouth daily with supper., Disp: 30 tablet, Rfl: 0 ?  Magnesium Hydroxide (MILK OF MAGNESIA PO), If no BM in 3 days, give 30 cc Milk of Magnesium p.o. x 1 dose in 24 hours as needed (Do not use standing constipation orders for residents with renal failure CFR less than 30. Contact MD for orders), Disp: , Rfl:  ?  meclizine (ANTIVERT) 25 MG tablet, Take 1 tablet (25 mg total) by mouth 3 (three)  times daily as needed for dizziness., Disp: 30 tablet, Rfl: 0 ?  metoprolol tartrate (LOPRESSOR) 50 MG tablet, Take 1 tablet (50 mg total) by mouth 2 (two) times daily., Disp: 60 tablet, Rfl: 0 ?  NON FORMULARY, Heart healthy LCS diet, Disp: , Rfl:  ?  OLANZapine (ZYPREXA) 2.5 MG tablet, Take 1 tablet (2.5 mg total) by mouth at bedtime., Disp: 30 tablet, Rfl: 0 ?  omeprazole (PRILOSEC) 20 MG capsule, Take 1 capsule (20 mg total) by mouth at bedtime., Disp: 30 capsule, Rfl: 0 ?  OXcarbazepine (TRILEPTAL) 150 MG tablet, Take 1 tablet (150 mg total) by mouth 2 (two) times daily., Disp: 60 tablet, Rfl: 0 ?  Oxycodone HCl 10 MG TABS, Take 1 tablet (10 mg total) by mouth every 6 (six) hours as needed., Disp: 30  tablet, Rfl: 0 ?  Sodium Phosphates (RA SALINE ENEMA RE), If not relieved by Biscodyl suppository, give disposable Saline Enema rectally X 1 dose/24 hrs as needed, Disp: , Rfl:  ? ?Social History  ? ?Tobacco Use  ?Smoking Status Former  ? Types: Cigarettes  ?Smokeless Tobacco Never  ? ? ?Allergies  ?Allergen Reactions  ? Gemfibrozil Other (See Comments)  ?  Doesn't know its been awhile  ? Oxycontin [Oxycodone Hcl] Other (See Comments)  ?  Bad reaction "took me to a place I never wanna go again"  ? Pork-Derived Products   ? Simvastatin Other (See Comments)  ?  Says he doesn't know its been awhile  ? ?Objective:  ?There were no vitals filed for this visit. ?There is no height or weight on file to calculate BMI. ?Constitutional Well developed. ?Well nourished.  ?Vascular Foot warm and well perfused. ?Capillary refill normal to all digits.   ?Neurologic Normal speech. ?Oriented to person, place, and time. ?Epicritic sensation to light touch grossly present bilaterally.  ?Dermatologic Skin incisions plantarly have dehisced and distal flap loose with some fibrous tissue and discharge.  . Large dorsal foot wound with noted improvement of granular tissue starting to cover metatarsals some necrotic tissue noted to the edges of wound. Proixmally and distally.  Malodor noted. Does appear improved.   ?Orthopedic: Tenderness to palpation noted about the surgical site.  ? ?Radiographs: interval loss of distal forefoot with resection at transmetatarsal level.  ?Assessment:  ? ?1. Post-operative state   ?2. Type 2 diabetes mellitus with peripheral neuropathy (HCC)   ? ? ? ?Plan:  ?Patient was evaluated and treated and all questions answered. ? ?S/p foot surgery right ?-Progressing as expected post-operatively. ?-WB Status: Heel WBAT in surgical shoe ?-Sutures: additional sutures removed today.  ?-Continue with daily WTD dressing changes. Seeing wound care weekly.  ?Debridement of wound preformed today to healthy bleeding tissue.   ?-Medications: N/a ?-Foot redressed. ?Discussed if any worsening pain swelling, redness, fever nausea or vomiting to report to the emergency room.  ?Return in 4 week ? ?No follow-ups on file.  ? ?

## 2021-11-21 ENCOUNTER — Other Ambulatory Visit: Payer: Self-pay

## 2021-11-21 ENCOUNTER — Ambulatory Visit: Payer: Medicare HMO | Admitting: Physician Assistant

## 2021-11-21 VITALS — BP 119/74 | HR 62 | Temp 97.7°F | Resp 20 | Ht 72.0 in | Wt 218.5 lb

## 2021-11-21 DIAGNOSIS — I70223 Atherosclerosis of native arteries of extremities with rest pain, bilateral legs: Secondary | ICD-10-CM

## 2021-11-21 NOTE — Progress Notes (Signed)
Office Note     CC:  follow up Requesting Provider:  Harvie Junior, MD  HPI: [name] is a 66 y.o. (03-18-1956) male who presents status post right below the knee popliteal artery to posterior tibial artery bypass using vein as well as fourth metatarsal ray amputation by Dr. Unk Lightning on 09/15/2021.  Subsequently he underwent transmetatarsal amputation by podiatry on 09/19/2021.  He returns today for bypass surveillance.  He believes the right TMA is healing appropriately.  He denies any rest pain or further tissue loss.  Patient also states that about 1 month ago his left great toenail fell off without trauma and his toe tip is now turning black.  He denies any pain to his foot.  Past medical history significant for insulin-dependent diabetes mellitus.  He denies fevers, chills, nausea/vomiting.  He is taking his Plavix daily.  He is a former smoker.  Past Medical History:  Diagnosis Date   Diabetes mellitus with peripheral vascular disease    Hypertension    Neuropathy    PTSD (post-traumatic stress disorder)    Sleep apnea     Past Surgical History:  Procedure Laterality Date   ABDOMINAL AORTOGRAM W/LOWER EXTREMITY Right 09/10/2021   Procedure: ABDOMINAL AORTOGRAM W/LOWER EXTREMITY;  Surgeon: [name], MD;  Location: [facility];  Service: Cardiovascular;  Laterality: Right;   ABDOMINAL HERNIA REPAIR     AMPUTATION Right 09/19/2021   Procedure: TRANSMETATARSAL AMPUTATION FOOT;  Surgeon: Lorenda Peck, MD;  Location: [facility];  Service: Podiatry;  Laterality: Right;  Surgical team to do local block   AMPUTATION TOE Right 09/15/2021   Procedure: AMPUTATION 4th TOE;  Surgeon: [name], MD;  Location: [facility];  Service: Vascular;  Laterality: Right;   BACK SURGERY     FEMORAL-TIBIAL BYPASS GRAFT Right 09/15/2021   Procedure: BYPASS GRAFT BELOW KNEE POPLITEAL ARTERY TO POSTERIOR TIBIAL ARTERY;  Surgeon: [name], MD;  Location: [facility];  Service: Vascular;   Laterality: Right;   IRRIGATION AND DEBRIDEMENT FOOT Right 09/17/2021   Procedure: IRRIGATION AND DEBRIDEMENT FOOT;  Surgeon: Lorenda Peck, MD;  Location: [facility];  Service: Podiatry;  Laterality: Right;  Suregeon will do anesthesia block   KNEE SURGERY     PERIPHERAL VASCULAR BALLOON ANGIOPLASTY  09/10/2021   Procedure: PERIPHERAL VASCULAR BALLOON ANGIOPLASTY;  Surgeon: [name], MD;  Location: [facility];  Service: Cardiovascular;;  rt sfa pta   SHOULDER SURGERY     VEIN HARVEST Right 09/15/2021   Procedure: GREATER SAPHENOUS VEIN HARVEST;  Surgeon: [name], MD;  Location: [facility];  Service: Vascular;  Laterality: Right;    Social History   Socioeconomic History   Marital status: Married    Spouse name: Not on file   Number of children: Not on file   Years of education: Not on file   Highest education level: Not on file  Occupational History   Not on file  Tobacco Use   Smoking status: Former    Types: Cigarettes    Passive exposure: Never   Smokeless tobacco: Never  Vaping Use   Vaping Use: Never used  Substance and Sexual Activity   Alcohol use: No   Drug use: Never   Sexual activity: Not on file  Other Topics Concern   Not on file  Social History Narrative   Not on file   Social Determinants of Health   Financial Resource Strain: Not on file  Food Insecurity: Not  on file  Transportation Needs: Not on file  Physical Activity: Not on file  Stress: Not on file  Social Connections: Not on file  Intimate Partner Violence: Not on file    Family History  Problem Relation Age of Onset   Stroke Mother    Coronary artery disease Father    COPD Brother     Current Outpatient Medications  Medication Sig Dispense Refill   albuterol (VENTOLIN HFA) 108 (90 Base) MCG/ACT inhaler Inhale 2 puffs into the lungs every 6 (six) hours as needed for wheezing or shortness of breath. 1 each 0   amLODipine (NORVASC) 5 MG tablet Take 1 tablet (5 mg total) by  mouth every morning. 30 tablet 0   aspirin EC 81 MG tablet Take 1 tablet (81 mg total) by mouth daily. Swallow whole. 150 tablet 2   atorvastatin (LIPITOR) 80 MG tablet Take 1 tablet (80 mg total) by mouth at bedtime. 30 tablet 0   bisacodyl (DULCOLAX) 10 MG suppository If not relieved by MOM, give 10 mg Bisacodyl suppositiory rectally X 1 dose in 24 hours as needed     buPROPion (WELLBUTRIN XL) 300 MG 24 hr tablet Take 1 tablet (300 mg total) by mouth every morning. 30 tablet 0   carboxymethylcellulose (REFRESH PLUS) 0.5 % SOLN Place 1 drop into both eyes 4 (four) times daily as needed (dry eyes).     cholecalciferol (VITAMIN D) 25 MCG (1000 UNIT) tablet Take 1,000 Units by mouth daily.     clopidogrel (PLAVIX) 75 MG tablet Take 1 tablet (75 mg total) by mouth daily. 30 tablet 0   empagliflozin (JARDIANCE) 25 MG TABS tablet Take 1 tablet (25 mg total) by mouth daily. 30 tablet 0   fluticasone furoate-vilanterol (BREO ELLIPTA) 100-25 MCG/INH AEPB Inhale 1 puff into the lungs daily. 60 each 5   gabapentin (NEURONTIN) 300 MG capsule Take 1 capsule (300 mg total) by mouth 2 (two) times daily. 30 capsule 0   gabapentin (NEURONTIN) 600 MG tablet Take 600 mg by mouth 2 (two) times daily as needed.     insulin aspart protamine- aspart (NOVOLOG MIX 70/30) (70-30) 100 UNIT/ML injection Inject 0.4 mLs (40 Units total) into the skin 2 (two) times daily with a meal. 10 mL 0   lisinopril (ZESTRIL) 20 MG tablet Take 1 tablet (20 mg total) by mouth daily. 30 tablet 0   lurasidone (LATUDA) 40 MG TABS tablet Take 1 tablet (40 mg total) by mouth daily with supper. 30 tablet 0   Magnesium Hydroxide (MILK OF MAGNESIA PO) If no BM in 3 days, give 30 cc Milk of Magnesium p.o. x 1 dose in 24 hours as needed (Do not use standing constipation orders for residents with renal failure CFR less than 30. Contact MD for orders)     meclizine (ANTIVERT) 25 MG tablet Take 1 tablet (25 mg total) by mouth 3 (three) times daily as  needed for dizziness. 30 tablet 0   metoprolol tartrate (LOPRESSOR) 50 MG tablet Take 1 tablet (50 mg total) by mouth 2 (two) times daily. 60 tablet 0   NON FORMULARY Heart healthy LCS diet     OLANZapine (ZYPREXA) 2.5 MG tablet Take 1 tablet (2.5 mg total) by mouth at bedtime. 30 tablet 0   omeprazole (PRILOSEC) 20 MG capsule Take 1 capsule (20 mg total) by mouth at bedtime. 30 capsule 0   OXcarbazepine (TRILEPTAL) 150 MG tablet Take 1 tablet (150 mg total) by mouth 2 (two) times daily. [employer]  tablet 0   Oxycodone HCl 10 MG TABS Take 1 tablet (10 mg total) by mouth every 6 (six) hours as needed. 30 tablet 0   Sodium Phosphates (RA SALINE ENEMA RE) If not relieved by Biscodyl suppository, give disposable Saline Enema rectally X 1 dose/24 hrs as needed     No current facility-administered medications for this visit.    Allergies  Allergen Reactions   Gemfibrozil Other (See Comments)    Doesn't know its been awhile   Oxycontin [Oxycodone Hcl] Other (See Comments)    Bad reaction "took me to a place I never wanna go again"   Pork-Derived Products    Simvastatin Other (See Comments)    Says he doesn't know its been awhile     REVIEW OF SYSTEMS:   '[X]'$  denotes positive finding, '[ ]'$  denotes negative finding Cardiac  Comments:  Chest pain or chest pressure:    Shortness of breath upon exertion:    Short of breath when lying flat:    Irregular heart rhythm:        Vascular    Pain in calf, thigh, or hip brought on by ambulation:    Pain in feet at night that wakes you up from your sleep:     Blood clot in your veins:    Leg swelling:         Pulmonary    Oxygen at home:    Productive cough:     Wheezing:         Neurologic    Sudden weakness in arms or legs:     Sudden numbness in arms or legs:     Sudden onset of difficulty speaking or slurred speech:    Temporary loss of vision in one eye:     Problems with dizziness:         Gastrointestinal    Blood in stool:     Vomited  blood:         Genitourinary    Burning when urinating:     Blood in urine:        Psychiatric    Major depression:         Hematologic    Bleeding problems:    Problems with blood clotting too easily:        Skin    Rashes or ulcers:        Constitutional    Fever or chills:      PHYSICAL EXAMINATION:  Vitals:   11/21/21 1223  BP: 119/74  Pulse: 62  Resp: 20  Temp: 97.7 F (36.5 C)  TempSrc: Temporal  SpO2: 99%  Weight: 218 lb 8 oz (99.1 kg)  Height: 6' (1.829 m)    General:  WDWN in NAD; vital signs documented above Gait: Not observed HENT: WNL, normocephalic Pulmonary: normal non-labored breathing , without Rales, rhonchi,  wheezing Cardiac: regular HR Abdomen: soft, NT, no masses Skin: without rashes Vascular Exam/Pulses: Patient deferred exam of right TMA due to recent dressing change with home health nurse; left great toe pictured below Extremities:     Musculoskeletal: no muscle wasting or atrophy  Neurologic: A&O X 3;  No focal weakness or paresthesias are detected Psychiatric:  The pt has Normal affect.   Non-Invasive Vascular Imaging:   Patent bypass of right leg however patient has a 556 cm/s in the mid to distal graft as well as low flow volumes elsewhere  ABI/TBIToday's ABIToday's TBIPrevious ABIPrevious TBI  +-------+-----------+-----------+------------+------------+  Right  0.7  amp        0.55        absent        +-------+-----------+-----------+------------+------------+  Left   0.3        gangrene   0.29        absent        +-------+-----------+-----------+------------+------------+   ASSESSMENT/PLAN:: 66 y.o. male here for follow up for surveillance of bypass graft; new left great toe gangrene  -Based on arterial duplex, bypass graft the right leg is patent however is threatened given a high-grade stenosis in the mid to distal graft.  Plan will be for aortogram with right leg runoff to address area of  stenosis.  During our exam patient also mentioned a wound of his left great toe.  The left great toe is pictured above with dry gangrene.  He has an ABI of 0.3 on the left with a toe pressure of 0.  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  Case was discussed with Dr. Virl Cagey who will attempt to study the left leg in addition to addressing the threatened bypass on the right leg.  Angiography will take place on Wednesday, 11/26/2021.  Procedure was discussed in detail with the patient including risks and he agrees to proceed.  He will continue Plavix perioperatively.   Dagoberto Ligas, PA-C [facility] [phone]  Clinic MD:   Virl Cagey

## 2021-11-26 ENCOUNTER — Encounter (HOSPITAL_COMMUNITY): Admission: RE | Payer: Self-pay | Source: Home / Self Care

## 2021-11-26 ENCOUNTER — Ambulatory Visit (HOSPITAL_COMMUNITY): Admission: RE | Admit: 2021-11-26 | Payer: Medicare HMO | Source: Home / Self Care | Admitting: Vascular Surgery

## 2021-11-26 SURGERY — ABDOMINAL AORTOGRAM W/LOWER EXTREMITY
Anesthesia: LOCAL

## 2021-11-27 ENCOUNTER — Encounter (HOSPITAL_BASED_OUTPATIENT_CLINIC_OR_DEPARTMENT_OTHER): Payer: Medicare HMO | Admitting: Internal Medicine

## 2021-11-27 DIAGNOSIS — T8781 Dehiscence of amputation stump: Secondary | ICD-10-CM | POA: Diagnosis not present

## 2021-11-27 DIAGNOSIS — L97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 severity: Secondary | ICD-10-CM | POA: Diagnosis not present

## 2021-11-27 NOTE — Progress Notes (Signed)
SPENCE, SOBERANO ([id number]) Visit Report for 11/27/2021 Chief Complaint Document Details Patient Name: Date of Service: [name], [name] 11/27/2021 9:00 A M Medical Record Number: [id number] Patient Account Number: [id number] Date of Birth/Sex: Treating RN: 07-11-1955 (66 y.o. [name] Primary Care Provider: York Ram Other Clinician: Referring Provider: Treating Provider/Extender: Coralee North Weeks in Treatment: 6 Information Obtained from: Patient Chief Complaint 10/16/20; patient is here with a substantial wound on his right foot in the setting of a recent transmetatarsal amputation. Electronic Signature(s) Signed: 11/27/2021 12:50:58 PM By: [name] DO Entered By: [name] on 11/27/2021 12:45:35 -------------------------------------------------------------------------------- Debridement Details Patient Name: Date of Service: [name], [name] 11/27/2021 9:00 A M Medical Record Number: [id number] Patient Account Number: [id number] Date of Birth/Sex: Treating RN: 10-31-1955 (66 y.o. [name] Primary Care Provider: York Ram Other Clinician: Referring Provider: Treating Provider/Extender: Coralee North Weeks in Treatment: 6 Debridement Performed for Assessment: Wound #1 Right Amputation Site - Transmetatarsal Performed By: Physician [name], DO Debridement Type: Debridement Level of Consciousness (Pre-procedure): Awake and Alert Pre-procedure Verification/Time Out Yes - 10:12 Taken: Start Time: 10:13 Pain Control: Other : Benzocaine 20% T Area Debrided (L x W): otal 11 (cm) x 7 (cm) = 77 (cm) Tissue and other material debrided: Viable, Non-Viable, Callus, Slough, Subcutaneous, Slough Level: Skin/Subcutaneous Tissue Debridement Description: Excisional Instrument: Blade, Curette, Forceps Bleeding: Moderate Hemostasis Achieved: Pressure End Time: 10:18 Procedural Pain:  0 Post Procedural Pain: 0 Response to Treatment: Procedure was tolerated well Level of Consciousness (Post- Awake and Alert procedure): Post Debridement Measurements of Total Wound Length: (cm) 11 Width: (cm) 7 Depth: (cm) 2.3 Volume: (cm) 139.094 Character of Wound/Ulcer Post Debridement: Improved Post Procedure Diagnosis Same as Pre-procedure Electronic Signature(s) Signed: 11/27/2021 12:50:58 PM By: [name] DO Signed: 11/27/2021 5:01:49 PM By: [name] RN, [name] Entered By: [name] on 11/27/2021 10:19:33 -------------------------------------------------------------------------------- HPI Details Patient Name: Date of Service: [name], [name] 11/27/2021 9:00 A M Medical Record Number: [id number] Patient Account Number: [id number] Date of Birth/Sex: Treating RN: [birth year] (66 y.o. [name] Primary Care Provider: York Ram Other Clinician: Referring Provider: Treating Provider/Extender: Coralee North Weeks in Treatment: 6 History of Present Illness HPI Description: ADMISSION 10/16/20 This is a 66 year old man who is a type II diabetic. Initially seen by Dr. Unk Lightning of vein and vascular on 09/10/2021 for bilateral lower extremity rest pain right greater than left. His ABIs demonstrated monophasic waveforms at the ankles bilaterally with depressed toe pressures. His ABI in the right was 0.5 and on the left 0.28. There were no obtainable waveforms for TBI's. He underwent angiography on 09/10/2021 and had findings of severe PAD with 95% stenosis of the distal SFA. He also had peroneal and posterior tibial arteries occluded with significant collateralization in the calf there was reconstitution of the posterior tibial artery in the distal third of the calf. Ostia of the anterior tibial artery was not appreciated. He underwent a angioplasty of the superficial femoral artery. He required admission to hospital from 09/12/2021 through  09/22/2021 with right foot cellulitis and sepsis. On 3/13 he underwent a right below-knee popliteal to posterior tibial bypass using a greater saphenous vein. 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 He was discharged to [facility] skilled facility. He arrives in clinic today with a large wound area over the entirety of his  amputation site extending proximally. The attempted flap closure of the amputation site and his TMA has failed and the wound extends under the attempt to closure. Still some sutures in place. There is an odor but he is not systemically unwell. He is due for follow-up arterial studies and has an appointment with vascular surgery on 10/28/2021. They are using wet-to-dry dressings at [facility]. Past medical history includes type 2 diabetes with peripheral neuropathy, hypertension, obstructive sleep apnea 4/20; patient presents for follow-up. He is being discharged from his facility at [facility] today. They have been doing Dakin's wet-to-dry dressings. He states that his fiance can help with dressing changes. He is getting [facility] home [facility] to come out 3 times a week once he leaves the facility. He is scheduled to see vein and vascular on 5/12. He has not seen the wound himself. He puts covers over his face for wound exams. He currently denies systemic signs of infection. 4/28; patient admitted to the clinic 2 weeks ago with a dehisced right TMA in the setting of type 2 diabetes with significant PAD. He is status post revascularization. He has been discharged from the nursing home he was at and now is at home using Dakin's wet-to-dry dressings daily he has home health. He denies any systemic signs of infection 5/4; patient presents for follow-up. His fiance and home health have been changing his dressings. He currently denies systemic signs of infection. He continues to put sheet covers over his face  during our wound encounters because he does not want a look at the wound. 5/25; Patient presents for follow-up. He missed his last clinic appointment. He had a bone biopsy and culture done at last clinic visit that showed acute osteomyelitis with growth of E. coli, stenotrophomonas maltophilia and enteroccocus faecalis. A referral to infectious disease was made. He has not heard from them yet. He has been using Dakin's wet-to-dry dressings. He is now more comfortable with looking at the wound bed. Electronic Signature(s) Signed: 11/27/2021 12:50:58 PM By: [name] DO Entered By: [name] on 11/27/2021 12:47:50 -------------------------------------------------------------------------------- Physical Exam Details Patient Name: Date of Service: [name], [name] 11/27/2021 9:00 A M Medical Record Number: [id number] Patient Account Number: [id number] Date of Birth/Sex: Treating RN: Dec 21, 1955 (66 y.o. [name] Primary Care Provider: York Ram Other Clinician: Referring Provider: Treating Provider/Extender: Coralee North Weeks in Treatment: 6 Constitutional respirations regular, non-labored and within target range for patient.Marland Kitchen Psychiatric pleasant and cooperative. Notes Right foot with transmetatarsal amputation with large wound dehiscence. There is a flap of tissue inferiorly that undermines. There is exposed bone that appears to be the first and third metatarsal. Tissue present is mostly granulation tissue with some nonviable tissue. No surrounding soft tissue infection. Electronic Signature(s) Signed: 11/27/2021 12:50:58 PM By: [name] DO Entered By: [name] on 11/27/2021 12:48:16 -------------------------------------------------------------------------------- Physician Orders Details Patient Name: Date of Service: [name], [name] 11/27/2021 9:00 A M Medical Record Number: [id number] Patient Account Number:  [id number] Date of Birth/Sex: Treating RN: 1955-08-13 (66 y.o. [name] Primary Care Provider: York Ram Other Clinician: Referring Provider: Treating Provider/Extender: Coralee North Weeks in Treatment: 6 Verbal / Phone Orders: No Diagnosis Coding Follow-up Appointments ppointment in 2 weeks. - 12/09/21 @ 1100 with Dr. Heber [place] [facility] Anna, Room 7) Return A Other: - ensure to follow up with infectious disease and vascular. Pick up oral antibiotics. Bathing/ Shower/ Hygiene May shower and wash wound with soap and water. -  May wash with antibacterial soap with dressing changes Off-Loading Open toe surgical shoe to: - to right foot: Reduce pressure to area as much as possible. Additional Orders / Instructions Follow Nutritious Diet - -High Protein Diet -Monitor/Control Blood Sugar Home Health Admit to Home Health for wound care. May utilize formulary equivalent dressing for wound treatment orders unless otherwise specified. Dressing changes to be completed by [place] on Monday / Wednesday / Friday except when patient has scheduled visit at [facility]. Other Home Health Orders/Instructions: [facility] Wound Treatment Wound #1 - Amputation Site - Transmetatarsal Wound Laterality: Right Cleanser: Soap and Water 1 x Per Day/30 Days Discharge Instructions: May shower and wash wound with dial antibacterial soap and water prior to dressing change. Cleanser: Wound Cleanser 1 x Per Day/30 Days Discharge Instructions: Cleanse the wound with wound cleanser prior to applying a clean dressing using gauze sponges, not tissue or cotton balls. Prim Dressing: Dakin's Solution 0.25%, 16 (oz) (Generic) 1 x Per Day/30 Days ary Discharge Instructions: Moisten gauze with Dakin's solution Secondary Dressing: ABD Pad, 8x10 1 x Per Day/30 Days Discharge Instructions: Apply over primary dressing as directed. Secondary Dressing: Woven Gauze Sponge, Non-Sterile 4x4 in 1 x  Per Day/30 Days Discharge Instructions: Moisten with Dakins and pack lightly into wound Secured With: Elastic Bandage 4 inch (ACE bandage) 1 x Per Day/30 Days Discharge Instructions: Secure with ACE bandage as directed. Secured With: [employer], 4.5x3.1 (in/yd) 1 x Per Day/30 Days Discharge Instructions: Secure with Kerlix as directed. Patient Medications llergies: gemfibrozil, OxyContin, pork derived (porcine), simvastatin A Notifications Medication Indication Start End 11/27/2021 Keflex DOSE 1 - oral 500 mg capsule - 1 capsule oral q6h x 14 days Electronic Signature(s) Signed: 11/27/2021 12:50:58 PM By: [name] DO Previous Signature: 11/27/2021 10:27:55 AM Version By: [name] DO Entered By: [name] on 11/27/2021 12:48:25 -------------------------------------------------------------------------------- Problem List Details Patient Name: Date of Service: [name], [name] 11/27/2021 9:00 A M Medical Record Number: [id number] Patient Account Number: [id number] Date of Birth/Sex: Treating RN: [birth year] (66 y.o. [name] Primary Care Provider: York Ram Other Clinician: Referring Provider: Treating Provider/Extender: Coralee North Weeks in Treatment: 6 Active Problems ICD-10 Encounter Code Description Active Date MDM Diagnosis E11.621 Type 2 diabetes mellitus with foot ulcer 10/16/2021 No Yes 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 10/16/2021 No Yes severity E11.52 Type 2 diabetes mellitus with diabetic peripheral angiopathy with gangrene 10/16/2021 No Yes E11.42 Type 2 diabetes mellitus with diabetic polyneuropathy 10/16/2021 No Yes Inactive Problems Resolved Problems Electronic Signature(s) Signed: 11/27/2021 12:50:58 PM By: [name] DO Entered By: [name] on 11/27/2021  12:45:12 -------------------------------------------------------------------------------- Progress Note Details Patient Name: Date of Service: [name], [name] 11/27/2021 9:00 A M Medical Record Number: [id number] Patient Account Number: [id number] Date of Birth/Sex: Treating RN: October 20, 1955 (66 y.o. [name] Primary Care Provider: York Ram Other Clinician: Referring Provider: Treating Provider/Extender: Coralee North Weeks in Treatment: 6 Subjective Chief Complaint Information obtained from Patient 10/16/20; patient is here with a substantial wound on his right foot in the setting of a recent transmetatarsal amputation. History of Present Illness (HPI) ADMISSION 10/16/20 This is a 66 year old man who is a type II diabetic. Initially seen by Dr. Unk Lightning of vein and vascular on 09/10/2021 for bilateral lower extremity rest pain right greater than left. His ABIs demonstrated monophasic waveforms at the ankles bilaterally with depressed toe pressures.  His ABI in the right was 0.5 and on the left 0.28. There were no obtainable waveforms for TBI's. He underwent angiography on 09/10/2021 and had findings of severe PAD with 95% stenosis of the distal SFA. He also had peroneal and posterior tibial arteries occluded with significant collateralization in the calf there was reconstitution of the posterior tibial artery in the distal third of the calf. Ostia of the anterior tibial artery was not appreciated. He underwent a angioplasty of the superficial femoral artery. He required admission to hospital from 09/12/2021 through 09/22/2021 with right foot cellulitis and sepsis. On 3/13 he underwent a right below-knee popliteal to posterior tibial bypass using a greater saphenous vein. 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 He was discharged to [facility] skilled facility. He arrives in  clinic today with a large wound area over the entirety of his amputation site extending proximally. The attempted flap closure of the amputation site and his TMA has failed and the wound extends under the attempt to closure. Still some sutures in place. There is an odor but he is not systemically unwell. He is due for follow-up arterial studies and has an appointment with vascular surgery on 10/28/2021. They are using wet-to-dry dressings at [facility]. Past medical history includes type 2 diabetes with peripheral neuropathy, hypertension, obstructive sleep apnea 4/20; patient presents for follow-up. He is being discharged from his facility at [facility] today. They have been doing Dakin's wet-to-dry dressings. He states that his fiance can help with dressing changes. He is getting [facility] home [facility] to come out 3 times a week once he leaves the facility. He is scheduled to see vein and vascular on 5/12. He has not seen the wound himself. He puts covers over his face for wound exams. He currently denies systemic signs of infection. 4/28; patient admitted to the clinic 2 weeks ago with a dehisced right TMA in the setting of type 2 diabetes with significant PAD. He is status post revascularization. He has been discharged from the nursing home he was at and now is at home using Dakin's wet-to-dry dressings daily he has home health. He denies any systemic signs of infection 5/4; patient presents for follow-up. His fiance and home health have been changing his dressings. He currently denies systemic signs of infection. He continues to put sheet covers over his face during our wound encounters because he does not want a look at the wound. 5/25; Patient presents for follow-up. He missed his last clinic appointment. He had a bone biopsy and culture done at last clinic visit that showed acute osteomyelitis with growth of E. coli, stenotrophomonas maltophilia and enteroccocus faecalis. A referral to infectious  disease was made. He has not heard from them yet. He has been using Dakin's wet-to-dry dressings. He is now more comfortable with looking at the wound bed. Patient History Information obtained from Patient, Chart. Family History Diabetes - Mother,Siblings, Heart Disease - Siblings, Hypertension - Siblings, Stroke - Mother, No family history of Cancer, Hereditary Spherocytosis, Kidney Disease, Lung Disease, Seizures, Thyroid Problems, Tuberculosis. Social History Former smoker, Marital Status - Single, Alcohol Use - Never, Drug Use - No History, Caffeine Use - Daily. Medical History Respiratory Patient has history of Asthma, Sleep Apnea Cardiovascular Patient has history of Hypertension, Peripheral Venous Disease Endocrine Patient has history of Type II Diabetes Musculoskeletal Patient has history of Osteomyelitis Neurologic Patient has history of Neuropathy Medical A Surgical History Notes  nd Psychiatric PTSD Objective Constitutional respirations regular, non-labored and within target range for patient.. Vitals Time Taken: 9:15 AM, Height: 72 in, Weight: 216 lbs, BMI: 29.3, Temperature: 98 F, Pulse: 89 bpm, Respiratory Rate: 20 breaths/min, Blood Pressure: 143/85 mmHg, Capillary Blood Glucose: 235 mg/dl. Psychiatric pleasant and cooperative. General Notes: Right foot with transmetatarsal amputation with large wound dehiscence. There is a flap of tissue inferiorly that undermines. There is exposed bone that appears to be the first and third metatarsal. Tissue present is mostly granulation tissue with some nonviable tissue. No surrounding soft tissue infection. Integumentary (Hair, Skin) Wound #1 status is Open. Original cause of wound was Surgical Injury. The date acquired was: 09/19/2021. The wound has been in treatment 6 weeks. The wound is located on the Right Amputation Site - Transmetatarsal. The wound measures 11cm length x 7cm width x 2.3cm depth; 60.476cm^2 area  and 139.094cm^3 volume. There is bone, muscle, and Fat Layer (Subcutaneous Tissue) exposed. There is no tunneling noted, however, there is undermining starting at 11:00 and ending at 1:00 with a maximum distance of 1cm. There is additional undermining and at 3:00 and ending at 8:00 with a maximum distance of 1.8cm. There is a large amount of purulent drainage noted. Foul odor after cleansing was noted. The wound margin is well defined and not attached to the wound base. There is large (67-100%) red granulation within the wound bed. There is a small (1-33%) amount of necrotic tissue within the wound bed including Adherent Slough. Assessment Active Problems ICD-10 Type 2 diabetes mellitus with foot ulcer 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 severity Type 2 diabetes mellitus with diabetic peripheral angiopathy with gangrene Type 2 diabetes mellitus with diabetic polyneuropathy Patient's wound has shown improvement in size appearance since last clinic visit. I debrided nonviable tissue. There is still exposed bone. I recommended continuing Dakin's wet-to-dry dressings. He had a wound culture that abundant E. coli sensitive to cefazolin. I will go ahead and start him on this. A referral was made to infectious disease for further assistance with his acute osteomyelitis. Continue aggressive offloading. Follow-up in 1 week. Procedures Wound #1 Pre-procedure diagnosis of Wound #1 is a Dehisced Wound located on the Right Amputation Site - Transmetatarsal . There was a Excisional Skin/Subcutaneous Tissue Debridement with a total area of 77 sq cm performed by [name], DO. With the following instrument(s): Blade, Curette, and Forceps to remove Viable and Non-Viable tissue/material. Material removed includes Callus, Subcutaneous Tissue, and Slough after achieving pain control using Other (Benzocaine 20%). A time out was conducted at 10:12, prior to the start of  the procedure. A Moderate amount of bleeding was controlled with Pressure. The procedure was tolerated well with a pain level of 0 throughout and a pain level of 0 following the procedure. Post Debridement Measurements: 11cm length x 7cm width x 2.3cm depth; 139.094cm^3 volume. Character of Wound/Ulcer Post Debridement is improved. Post procedure Diagnosis Wound #1: Same as Pre-Procedure Plan Follow-up Appointments: Return Appointment in 2 weeks. - 12/09/21 @ 1100 with Dr. Heber [place] Leveda Anna, Room 7) Other: - ensure to follow up with infectious disease and vascular. Pick up oral antibiotics. Bathing/ Shower/ Hygiene: May shower and wash wound with soap and water. - May wash with antibacterial soap with dressing changes Off-Loading: Open toe surgical shoe to: - to right foot: Reduce pressure to area as much as possible. Additional Orders / Instructions: Follow Nutritious Diet - -High Protein Diet -Monitor/Control Blood Sugar Home Health: Admit  to [facility] for wound care. May utilize formulary equivalent dressing for wound treatment orders unless otherwise specified. Dressing changes to be completed by [facility] on Monday / Wednesday / Friday except when patient has scheduled visit at [facility]. Other Home Health Orders/Instructions: Alvis Lemmings [facility] The following medication(s) was prescribed: Keflex oral 500 mg capsule 1 1 capsule oral q6h x 14 days starting 11/27/2021 WOUND #1: - Amputation Site - Transmetatarsal Wound Laterality: Right Cleanser: Soap and Water 1 x Per Day/30 Days Discharge Instructions: May shower and wash wound with dial antibacterial soap and water prior to dressing change. Cleanser: Wound Cleanser 1 x Per Day/30 Days Discharge Instructions: Cleanse the wound with wound cleanser prior to applying a clean dressing using gauze sponges, not tissue or cotton balls. Prim Dressing: Dakin's Solution 0.25%, 16 (oz) (Generic) 1 x Per Day/30 Days ary Discharge Instructions:  Moisten gauze with Dakin's solution Secondary Dressing: ABD Pad, 8x10 1 x Per Day/30 Days Discharge Instructions: Apply over primary dressing as directed. Secondary Dressing: Woven Gauze Sponge, Non-Sterile 4x4 in 1 x Per Day/30 Days Discharge Instructions: Moisten with Dakins and pack lightly into wound Secured With: Elastic Bandage 4 inch (ACE bandage) 1 x Per Day/30 Days Discharge Instructions: Secure with ACE bandage as directed. Secured With: [employer], 4.5x3.1 (in/yd) 1 x Per Day/30 Days Discharge Instructions: Secure with Kerlix as directed. 1. Dakin's wet-to-dry dressings 2. In office sharp debridement 3. Keflex 4. Infectious diseaseooreferral made on 5/19 5. Follow-up in 1 week Electronic Signature(s) Signed: 11/27/2021 12:50:58 PM By: [name] DO Entered By: [name] on 11/27/2021 12:49:58 -------------------------------------------------------------------------------- HxROS Details Patient Name: Date of Service: [name], [name] 11/27/2021 9:00 A M Medical Record Number: [id number] Patient Account Number: [id number] Date of Birth/Sex: Treating RN: 1955/08/18 (66 y.o. [name] Primary Care Provider: York Ram Other Clinician: Referring Provider: Treating Provider/Extender: Coralee North Weeks in Treatment: 6 Information Obtained From Patient Chart Respiratory Medical History: Positive for: Asthma; Sleep Apnea Cardiovascular Medical History: Positive for: Hypertension; Peripheral Venous Disease Endocrine Medical History: Positive for: Type II Diabetes Time with diabetes: 20 years Treated with: Insulin, Oral agents Blood sugar tested every day: Yes Tested : 4x day Musculoskeletal Medical History: Positive for: Osteomyelitis Neurologic Medical History: Positive for: Neuropathy Psychiatric Medical History: Past Medical History Notes: PTSD Immunizations Pneumococcal Vaccine: Received  Pneumococcal Vaccination: No Implantable Devices None Family and Social History Cancer: No; Diabetes: Yes - Mother,Siblings; Heart Disease: Yes - Siblings; Hereditary Spherocytosis: No; Hypertension: Yes - Siblings; Kidney Disease: No; Lung Disease: No; Seizures: No; Stroke: Yes - Mother; Thyroid Problems: No; Tuberculosis: No; Former smoker; Marital Status - Single; Alcohol Use: Never; Drug Use: No History; Caffeine Use: Daily; Financial Concerns: No; Food, Clothing or Shelter Needs: No; Support System Lacking: No; Transportation Concerns: No Electronic Signature(s) Signed: 11/27/2021 12:50:58 PM By: [name] DO Signed: 11/27/2021 4:54:47 PM By: Lorrin Jackson Entered By: [name] on 11/27/2021 12:47:56 -------------------------------------------------------------------------------- SuperBill Details Patient Name: Date of Service: [name], [name] 11/27/2021 Medical Record Number: [id number] Patient Account Number: [id number] Date of Birth/Sex: Treating RN: 02/23/1956 (66 y.o. 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 mellitus with diabetic polyneuropathy Facility Procedures CPT4 Code: 76546503 Description: 54656 - DEB SUBQ TISSUE 20 SQ CM/< ICD-10 Diagnosis Description 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 sev Modifier: erity Quantity: 1 CPT4 Code: [id number] Description: 01749 - DEB SUBQ TISS EA ADDL 20CM ICD-10 Diagnosis Description L97.518 Non-pressure chronic ulcer of other part of right  foot with other specified sev Modifier: erity Quantity: 3 Physician Procedures : CPT4 Code Description Modifier [id number] 16384 - WC PHYS SUBQ TISS 20 SQ CM ICD-10 Diagnosis Description 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 severity Quantity: 1 Electronic Signature(s) Signed: 11/27/2021 12:50:58 PM By: [name] DO Entered By: [name] on 11/27/2021 12:50:37

## 2021-11-27 NOTE — Progress Notes (Signed)
[name] ([id number]) Visit Report for 11/27/2021 Arrival Information Details Patient Name: Date of Service: [place], [name] 11/27/2021 9:00 A M Medical Record Number: [id number] Patient Account Number: [id number] Date of Birth/Sex: Treating RN: April 25, 1956 (66 y.o. [name] Primary Care Iyah Laguna: York Ram Other Clinician: Referring Urie Loughner: Treating Torri Michalski/Extender: Coralee North Weeks in Treatment: 6 Visit Information History Since Last Visit Added or deleted any medications: No Patient Arrived: [name] Any new allergies or adverse reactions: No Arrival Time: 09:15 Had a fall or experienced change in No Accompanied By: self activities of daily living that may affect Transfer Assistance: None risk of falls: Patient Identification Verified: Yes Signs or symptoms of abuse/neglect since No Secondary Verification Process Completed: Yes last visito Patient Requires Transmission-Based Precautions: No Hospitalized since last visit: No Patient Has Alerts: Yes Implantable device outside of the clinic No Patient Alerts: Patient on Blood Thinner excluding 09/14/21 ABI: R=.55 L=.29 cellular tissue based products placed in the 09/14/21 TBI=Absent center since last visit: Has Dressing in Place as Prescribed: Yes Has Footwear/Offloading in Place as Yes Prescribed: Right: Surgical Shoe with Pressure Relief Insole Pain Present Now: Yes Electronic Signature(s) Signed: 11/27/2021 5:01:49 PM By: [name] RN, [name] Entered By: [name] on 11/27/2021 [place] -------------------------------------------------------------------------------- Encounter Discharge Information Details Patient Name: Date of Service: [name], [name] 11/27/2021 9:00 A M Medical Record Number: [id number] Patient Account Number: [id number] Date of Birth/Sex: Treating RN: [birth year] (66 y.o. [name] Primary Care Khaleah Duer: York Ram Other  Clinician: Referring Nashly Olsson: Treating Tessi Eustache/Extender: Coralee North Weeks in Treatment: 6 Encounter Discharge Information Items Post Procedure Vitals Discharge Condition: Stable Temperature (F): 98 Ambulatory Status: Ambulatory Pulse (bpm): 89 Discharge Destination: Home Respiratory Rate (breaths/min): 20 Transportation: Private Auto Blood Pressure (mmHg): 143/85 Accompanied By: self Schedule Follow-up Appointment: Yes Clinical Summary of Care: Electronic Signature(s) Signed: 11/27/2021 5:01:49 PM By: [name] RN, [name] Entered By: [name] on 11/27/2021 10:19:24 -------------------------------------------------------------------------------- Lower Extremity Assessment Details Patient Name: Date of Service: [name], [name] 11/27/2021 9:00 A M Medical Record Number: [id number] Patient Account Number: [id number] Date of Birth/Sex: Treating RN: [birth year] (66 y.o. [name] Primary Care Mickael Mcnutt: York Ram Other Clinician: Referring Sybel Standish: Treating Melaney Tellefsen/Extender: Coralee North Weeks in Treatment: 6 Edema Assessment Assessed: [Left: No] [Right: Yes] Edema: [Left: Ye] [Right: s] Calf Left: Right: Point of Measurement: 33 cm From Medial Instep 40 cm Ankle Left: Right: Point of Measurement: 10 cm From Medial Instep 23 cm Vascular Assessment Pulses: Dorsalis Pedis Palpable: [Right:Yes] Electronic Signature(s) Signed: 11/27/2021 5:01:49 PM By: [name] RN, [name] Entered By: [name] on 11/27/2021 09:24:07 -------------------------------------------------------------------------------- Multi Wound Chart Details Patient Name: Date of Service: [name], [name] 11/27/2021 9:00 A M Medical Record Number: [id number] Patient Account Number: [id number] Date of Birth/Sex: Treating RN: 02-03-1956 (66 y.o. [name] Primary Care Ryder Man: York Ram Other Clinician: Referring  Dennard Vezina: Treating Betty Daidone/Extender: Coralee North Weeks in Treatment: 6 Vital Signs Height(in): 72 Capillary Blood Glucose(mg/dl): 235 Weight(lbs): 216 Pulse(bpm): 99 Body Mass Index(BMI): 29.3 Blood Pressure(mmHg): 143/85 Temperature(F): 98 Respiratory Rate(breaths/min): 20 Photos: [1:Right Amputation Site -] [N/A:N/A N/A] Wound Location: [1:Transmetatarsal Surgical Injury] [N/A:N/A] Wounding Event: [1:Dehisced Wound] [N/A:N/A] Primary Etiology: [1:Asthma, Sleep Apnea, Hypertension,] [N/A:N/A] Comorbid History: [1:Peripheral Venous Disease, Type II Diabetes, Osteomyelitis, Neuropathy 09/19/2021] [N/A:N/A] Date Acquired: [1:6] [N/A:N/A] Weeks of Treatment: [1:Open] [N/A:N/A] Wound Status: [1:No] [N/A:N/A] Wound Recurrence: [1:11x7x2.3] [N/A:N/A] Measurements L  x W x D (cm) [1:60.476] [N/A:N/A] A (cm) : rea [1:139.094] [N/A:N/A] Volume (cm) : [1:39.60%] [N/A:N/A] % Reduction in A rea: [1:50.40%] [N/A:N/A] % Reduction in Volume: [1:11] Starting Position 1 (o'clock): [1:1] Ending Position 1 (o'clock): [1:1] Maximum Distance 1 (cm): [1:3] Starting Position 2 (o'clock): [1:8] Ending Position 2 (o'clock): [1:1.8] Maximum Distance 2 (cm): [1:Yes] [N/A:N/A] Undermining: [1:Full Thickness With Exposed Support] [N/A:N/A] Classification: [1:Structures Large] [N/A:N/A] Exudate Amount: [1:Purulent] [N/A:N/A] Exudate Type: [1:yellow, brown, green] [N/A:N/A] Exudate Color: [1:Yes] [N/A:N/A] Foul Odor A Cleansing: [1:fter No] [N/A:N/A] Odor Anticipated Due to Product Use: [1:Well defined, not attached] [N/A:N/A] Wound Margin: [1:Large (67-100%)] [N/A:N/A] Granulation A mount: [1:Red] [N/A:N/A] Granulation Quality: [1:Small (1-33%)] [N/A:N/A] Necrotic Amount: [1:Fat Layer (Subcutaneous Tissue): Yes N/A] Exposed Structures: [1:Muscle: Yes Bone: Yes Fascia: No Tendon: No Joint: No Small (1-33%)] [N/A:N/A] Epithelialization: [1:Debridement - Excisional]  [N/A:N/A] Debridement: Pre-procedure Verification/Time Out 10:12 [N/A:N/A] Taken: [1:Other] [N/A:N/A] Pain Control: [1:Callus, Subcutaneous, Slough] [N/A:N/A] Tissue Debrided: [1:Skin/Subcutaneous Tissue] [N/A:N/A] Level: [1:77] [N/A:N/A] Debridement A (sq cm): [1:rea Blade, Curette, Forceps] [N/A:N/A] Instrument: [1:Moderate] [N/A:N/A] Bleeding: [1:Pressure] [N/A:N/A] Hemostasis A chieved: [1:0] [N/A:N/A] Procedural Pain: [1:0] [N/A:N/A] Post Procedural Pain: [1:Procedure was tolerated well] [N/A:N/A] Debridement Treatment Response: [1:11x7x2.3] [N/A:N/A] Post Debridement Measurements L x W x D (cm) [1:139.094] [N/A:N/A] Post Debridement Volume: (cm) [1:Debridement] [N/A:N/A] Treatment Notes Wound #1 (Amputation Site - Transmetatarsal) Wound Laterality: Right Cleanser Soap and Water Discharge Instruction: May shower and wash wound with dial antibacterial soap and water prior to dressing change. Wound Cleanser Discharge Instruction: Cleanse the wound with wound cleanser prior to applying a clean dressing using gauze sponges, not tissue or cotton balls. Peri-Wound Care Topical Primary Dressing Dakin's Solution 0.25%, 16 (oz) Discharge Instruction: Moisten gauze with Dakin's solution Secondary Dressing ABD Pad, 8x10 Discharge Instruction: Apply over primary dressing as directed. Woven Gauze Sponge, Non-Sterile 4x4 in Discharge Instruction: Moisten with Dakins and pack lightly into wound Secured With Elastic Bandage 4 inch (ACE bandage) Discharge Instruction: Secure with ACE bandage as directed. Kerlix Roll Sterile, 4.5x3.1 (in/yd) Discharge Instruction: Secure with Kerlix as directed. Compression Wrap Compression Stockings Add-Ons Electronic Signature(s) Signed: 11/27/2021 12:50:58 PM By: [name] DO Signed: 11/27/2021 4:54:47 PM By: Fara Chute By: [name] on 11/27/2021  12:45:18 -------------------------------------------------------------------------------- Multi-Disciplinary Care Plan Details Patient Name: Date of Service: [name], [name] 11/27/2021 9:00 A M Medical Record Number: [id number] Patient Account Number: [id number] Date of Birth/Sex: Treating RN: 04-02-1956 (66 y.o. [name] Primary Care Sargun Rummell: York Ram Other Clinician: Referring Kathrine Rieves: Treating Arcadia Gorgas/Extender: Coralee North Weeks in Treatment: 6 Active Inactive Wound/Skin Impairment Nursing Diagnoses: Impaired tissue integrity Goals: Patient/caregiver will verbalize understanding of skin care regimen Date Initiated: 10/16/2021 Target Resolution Date: 01/02/2022 Goal Status: Active Ulcer/skin breakdown will have a volume reduction of 30% by week 4 Date Initiated: 10/16/2021 Date Inactivated: 11/27/2021 Target Resolution Date: 11/13/2021 Unmet Reason: see wound Goal Status: Unmet measurements; positive bone culture and biopsy Interventions: Assess patient/caregiver ability to obtain necessary supplies Assess patient/caregiver ability to perform ulcer/skin care regimen upon admission and as needed Assess ulceration(s) every visit Provide education on ulcer and skin care Treatment Activities: Topical wound management initiated : 10/16/2021 Notes: Electronic Signature(s) Signed: 11/27/2021 5:01:49 PM By: [name] RN, [name] Entered By: [name] on 11/27/2021 09:36:45 -------------------------------------------------------------------------------- Pain Assessment Details Patient Name: Date of Service: [name], [name] 11/27/2021 9:00 [place] Record Number: [id number] Patient Account Number: [id number] Date of Birth/Sex: Treating RN: 10-27-1955 (66 y.o.  [name] Primary Care Travanti Mcmanus: York Ram Other Clinician: Referring Hamlet Lasecki: Treating Jamiah Recore/Extender: Coralee North Weeks in  Treatment: 6 Active Problems Location of Pain Severity and Description of Pain Patient Has Paino Yes Site Locations Pain Location: Pain in Ulcers Rate the pain. Current Pain Level: 4 Pain Management and Medication Current Pain Management: Medication: No Cold Application: No Rest: No Massage: No Activity: No T.E.N.S.: No Heat Application: No Leg drop or elevation: No Is the Current Pain Management Adequate: Adequate How does your wound impact your activities of daily livingo Sleep: No Bathing: No Appetite: No Relationship With Others: No Bladder Continence: No Emotions: No Bowel Continence: No Work: No Toileting: No Drive: No Dressing: No Hobbies: No Notes right foot pain. per patient has taken pain medication and pain decrease to current 4/10. Electronic Signature(s) Signed: 11/27/2021 5:01:49 PM By: [name] RN, [name] Entered By: [name] on 11/27/2021 09:17:02 -------------------------------------------------------------------------------- Patient/Caregiver Education Details Patient Name: Date of Service: [name], [name] 5/25/2023andnbsp9:00 A M Medical Record Number: [id number] Patient Account Number: [id number] Date of Birth/Gender: Treating RN: 08/22/1955 (66 y.o. [name] Primary Care Physician: York Ram Other Clinician: Referring Physician: Treating Physician/Extender: Dimas Aguas in Treatment: 6 Education Assessment Education Provided To: Patient Education Topics Provided Wound/Skin Impairment: Handouts: Skin Care Do's and Dont's Methods: Explain/Verbal Responses: Reinforcements needed Electronic Signature(s) Signed: 11/27/2021 5:01:49 PM By: [name] RN, [name] Entered By: [name] on 11/27/2021 09:36:57 -------------------------------------------------------------------------------- Wound Assessment Details Patient Name: Date of Service: [name], [name] 11/27/2021 9:00 A  M Medical Record Number: [id number] Patient Account Number: [id number] Date of Birth/Sex: Treating RN: [birth year] (66 y.o. [name] Primary Care Takaya Hyslop: York Ram Other Clinician: Referring Lossie Kalp: Treating Amellia Panik/Extender: Coralee North Weeks in Treatment: 6 Wound Status Wound Number: 1 Primary Dehisced Wound Etiology: Wound Location: Right Amputation Site - Transmetatarsal Wound Open Wounding Event: Surgical Injury Status: Date Acquired: 09/19/2021 Comorbid Asthma, Sleep Apnea, Hypertension, Peripheral Venous Disease, Weeks Of Treatment: 6 History: Type II Diabetes, Osteomyelitis, Neuropathy Clustered Wound: No Photos Wound Measurements Length: (cm) 11 Width: (cm) 7 Depth: (cm) 2.3 Area: (cm) 60.476 Volume: (cm) 139.094 Wound Description Classification: Full Thickness With Exposed Support Structures Wound Margin: Well defined, not attached Exudate Amount: Large Exudate Type: Purulent Exudate Color: yellow, brown, green Foul Odor After Cleansing: Due to Product Use: Slough/Fibrino % Reduction in Area: 39.6% % Reduction in Volume: 50.4% Epithelialization: Small (1-33%) Tunneling: No Undermining: Yes Location 1 Starting Position (o'clock): 11 Ending Position (o'clock): 1 Maximum Distance: (cm) 1 Location 2 Starting Position (o'clock): 3 Ending Position (o'clock): 8 Maximum Distance: (cm) 1.8 Yes No Yes Wound Bed Granulation Amount: Large (67-100%) Exposed Structure Granulation Quality: Red Fascia Exposed: No Necrotic Amount: Small (1-33%) Fat Layer (Subcutaneous Tissue) Exposed: Yes Necrotic Quality: Adherent Slough Tendon Exposed: No Muscle Exposed: Yes Necrosis of Muscle: No Joint Exposed: No Bone Exposed: Yes Treatment Notes Wound #1 (Amputation Site - Transmetatarsal) Wound Laterality: Right Cleanser Soap and Water Discharge Instruction: May shower and wash wound with dial antibacterial soap and water  prior to dressing change. Wound Cleanser Discharge Instruction: Cleanse the wound with wound cleanser prior to applying a clean dressing using gauze sponges, not tissue or cotton balls. Peri-Wound Care Topical Primary Dressing Dakin's Solution 0.25%, 16 (oz) Discharge Instruction: Moisten gauze with Dakin's solution Secondary Dressing ABD Pad, 8x10 Discharge Instruction: Apply over primary dressing as directed. Woven Gauze Sponge, Non-Sterile 4x4 in Discharge Instruction: Moisten  with Dakins and pack lightly into wound Secured With Elastic Bandage 4 inch (ACE bandage) Discharge Instruction: Secure with ACE bandage as directed. Kerlix Roll Sterile, 4.5x3.1 (in/yd) Discharge Instruction: Secure with Kerlix as directed. Compression Wrap Compression Stockings Add-Ons Electronic Signature(s) Signed: 11/27/2021 5:01:49 PM By: [name] RN, [name] Entered By: [name] on 11/27/2021 09:25:38 -------------------------------------------------------------------------------- Vitals Details Patient Name: Date of Service: [name], [name] 11/27/2021 9:00 A M Medical Record Number: [id number] Patient Account Number: [id number] Date of Birth/Sex: Treating RN: 02-12-1956 (66 y.o. [name] Primary Care Alyia Lacerte: York Ram Other Clinician: Referring Shell Yandow: Treating Teng Decou/Extender: Coralee North Weeks in Treatment: 6 Vital Signs Time Taken: 09:15 Temperature (F): 98 Height (in): 72 Pulse (bpm): 89 Weight (lbs): 216 Respiratory Rate (breaths/min): 20 Body Mass Index (BMI): 29.3 Blood Pressure (mmHg): 143/85 Capillary Blood Glucose (mg/dl): 235 Reference Range: 80 - 120 mg / dl Electronic Signature(s) Signed: 11/27/2021 5:01:49 PM By: [name] RN, [name] Entered By: [name] on 11/27/2021 09:16:26

## 2021-11-28 ENCOUNTER — Other Ambulatory Visit: Payer: Self-pay

## 2021-11-28 DIAGNOSIS — I70223 Atherosclerosis of native arteries of extremities with rest pain, bilateral legs: Secondary | ICD-10-CM

## 2021-12-02 ENCOUNTER — Telehealth: Payer: Self-pay

## 2021-12-02 NOTE — Telephone Encounter (Signed)
Informed Gay Filler of provider recommendations. She verbalized understanding.

## 2021-12-02 NOTE — Telephone Encounter (Signed)
-----   Message from [name], MD sent at 12/02/2021 11:07 AM EDT ----- Regarding: RE: Please advise He should call his pcp today to get an appointment. We should keep the procedure for tomorrow though ----- Message ----- From: Nicholas Lose, RN Sent: 12/02/2021  10:54 AM EDT To: [name], MD Subject: Please advise                                  Dr. Virl Cagey,   Patient is rescheduled for his angiogram on tomorrow. His fiance' Evelena Peat called to advise that he has been having vertigo x 2-3 days. Patient denies any other symptoms. He is out of his meclizine Rx. Evelena Peat inquired if patient should continue with procedure on tomorrow. I advised if patient is still symptomatic the morning of procedure, recommend to reschedule and to contact PCP office regarding his symptoms, refill request and any further recommendations. I can try to keep patient at the end of schedule so schedule is not disrupted.    Any further recommendations?   Herma Ard

## 2021-12-03 ENCOUNTER — Ambulatory Visit (HOSPITAL_BASED_OUTPATIENT_CLINIC_OR_DEPARTMENT_OTHER): Payer: Medicare HMO

## 2021-12-03 ENCOUNTER — Ambulatory Visit (HOSPITAL_COMMUNITY)
Admission: RE | Admit: 2021-12-03 | Discharge: 2021-12-03 | Disposition: A | Discharge status: Home / Self Care | Payer: Medicare HMO | Attending: Vascular Surgery | Admitting: Vascular Surgery

## 2021-12-03 ENCOUNTER — Encounter (HOSPITAL_COMMUNITY)
Admission: RE | Disposition: A | Discharge status: Home / Self Care | Payer: Self-pay | Source: Home / Self Care | Attending: Vascular Surgery

## 2021-12-03 DIAGNOSIS — E11621 Type 2 diabetes mellitus with foot ulcer: Secondary | ICD-10-CM | POA: Insufficient documentation

## 2021-12-03 DIAGNOSIS — Z87891 Personal history of nicotine dependence: Secondary | ICD-10-CM | POA: Insufficient documentation

## 2021-12-03 DIAGNOSIS — T82858A Stenosis of vascular prosthetic devices, implants and grafts, initial encounter: Secondary | ICD-10-CM | POA: Diagnosis not present

## 2021-12-03 DIAGNOSIS — Z7902 Long term (current) use of antithrombotics/antiplatelets: Secondary | ICD-10-CM | POA: Insufficient documentation

## 2021-12-03 DIAGNOSIS — I70262 Atherosclerosis of native arteries of extremities with gangrene, left leg: Secondary | ICD-10-CM | POA: Insufficient documentation

## 2021-12-03 DIAGNOSIS — Z95820 Peripheral vascular angioplasty status with implants and grafts: Secondary | ICD-10-CM | POA: Diagnosis not present

## 2021-12-03 DIAGNOSIS — Z01810 Encounter for preprocedural cardiovascular examination: Secondary | ICD-10-CM | POA: Diagnosis not present

## 2021-12-03 DIAGNOSIS — E1140 Type 2 diabetes mellitus with diabetic neuropathy, unspecified: Secondary | ICD-10-CM | POA: Diagnosis not present

## 2021-12-03 DIAGNOSIS — Y832 Surgical operation with anastomosis, bypass or graft as the cause of abnormal reaction of the patient, or of later complication, without mention of misadventure at the time of the procedure: Secondary | ICD-10-CM | POA: Diagnosis not present

## 2021-12-03 DIAGNOSIS — L97529 Non-pressure chronic ulcer of other part of left foot with unspecified severity: Secondary | ICD-10-CM | POA: Insufficient documentation

## 2021-12-03 DIAGNOSIS — E1152 Type 2 diabetes mellitus with diabetic peripheral angiopathy with gangrene: Secondary | ICD-10-CM | POA: Insufficient documentation

## 2021-12-03 DIAGNOSIS — I70223 Atherosclerosis of native arteries of extremities with rest pain, bilateral legs: Secondary | ICD-10-CM

## 2021-12-03 DIAGNOSIS — T82856A Stenosis of peripheral vascular stent, initial encounter: Secondary | ICD-10-CM | POA: Diagnosis not present

## 2021-12-03 HISTORY — PX: ABDOMINAL AORTOGRAM W/LOWER EXTREMITY: CATH118223

## 2021-12-03 HISTORY — PX: PERIPHERAL VASCULAR BALLOON ANGIOPLASTY: CATH118281

## 2021-12-03 LAB — POCT I-STAT, CHEM 8
BUN: 9 mg/dL (ref 8–23)
Calcium, Ion: 1.14 mmol/L — ABNORMAL LOW (ref 1.15–1.40)
Chloride: 105 mmol/L (ref 98–111)
Creatinine, Ser: 0.8 mg/dL (ref 0.61–1.24)
Glucose, Bld: 125 mg/dL — ABNORMAL HIGH (ref 70–99)
HCT: 47 % (ref 39.0–52.0)
Hemoglobin: 16 g/dL (ref 13.0–17.0)
Potassium: 4.2 mmol/L (ref 3.5–5.1)
Sodium: 139 mmol/L (ref 135–145)
TCO2: 26 mmol/L (ref 22–32)

## 2021-12-03 LAB — GLUCOSE, CAPILLARY
Glucose-Capillary: 194 mg/dL — ABNORMAL HIGH (ref 70–99)
Glucose-Capillary: 63 mg/dL — ABNORMAL LOW (ref 70–99)
Glucose-Capillary: 98 mg/dL (ref 70–99)

## 2021-12-03 SURGERY — ABDOMINAL AORTOGRAM W/LOWER EXTREMITY
Anesthesia: LOCAL | Laterality: Right

## 2021-12-03 MED ORDER — DIPHENHYDRAMINE HCL 50 MG/ML IJ SOLN
INTRAMUSCULAR | Status: DC | PRN
  Administered 2021-12-03: 25 mg via INTRAVENOUS

## 2021-12-03 MED ORDER — CLOPIDOGREL BISULFATE 75 MG PO TABS: 75.0000 mg | ORAL_TABLET | Freq: Every day | ORAL | 11 refills | Status: AC

## 2021-12-03 MED ORDER — MIDAZOLAM HCL 2 MG/2ML IJ SOLN
INTRAMUSCULAR | Status: AC
  Filled 2021-12-03: qty 2

## 2021-12-03 MED ORDER — HEPARIN SODIUM (PORCINE) 1000 UNIT/ML IJ SOLN
INTRAMUSCULAR | Status: AC
  Filled 2021-12-03: qty 10

## 2021-12-03 MED ORDER — LIDOCAINE HCL (PF) 1 % IJ SOLN
INTRAMUSCULAR | Status: DC | PRN
  Administered 2021-12-03: 15 mL

## 2021-12-03 MED ORDER — HEPARIN SODIUM (PORCINE) 1000 UNIT/ML IJ SOLN
INTRAMUSCULAR | Status: DC | PRN
  Administered 2021-12-03: 3000 [IU] via INTRAVENOUS
  Administered 2021-12-03: 11000 [IU] via INTRAVENOUS

## 2021-12-03 MED ORDER — CLOPIDOGREL BISULFATE 300 MG PO TABS
ORAL_TABLET | ORAL | Status: AC
Start: 2021-12-03 — End: ?
  Filled 2021-12-03: qty 1

## 2021-12-03 MED ORDER — SODIUM CHLORIDE 0.9% FLUSH: 3.0000 mL | INTRAVENOUS | Status: DC | PRN

## 2021-12-03 MED ORDER — MIDAZOLAM HCL 2 MG/2ML IJ SOLN
INTRAMUSCULAR | Status: DC | PRN
  Administered 2021-12-03: 1 mg via INTRAVENOUS

## 2021-12-03 MED ORDER — SODIUM CHLORIDE 0.9 % WEIGHT BASED INFUSION
1.0000 mL/kg/h | INTRAVENOUS | Status: DC
Start: 2021-12-03 — End: 2021-12-03

## 2021-12-03 MED ORDER — SODIUM CHLORIDE 0.9% FLUSH: 3.0000 mL | Freq: Two times a day (BID) | INTRAVENOUS | Status: DC

## 2021-12-03 MED ORDER — ACETAMINOPHEN 325 MG PO TABS: 650.0000 mg | ORAL_TABLET | ORAL | Status: DC | PRN

## 2021-12-03 MED ORDER — SODIUM CHLORIDE 0.9 % IV SOLN: INTRAVENOUS | Status: DC

## 2021-12-03 MED ORDER — LIDOCAINE HCL (PF) 1 % IJ SOLN
INTRAMUSCULAR | Status: AC
  Filled 2021-12-03: qty 30

## 2021-12-03 MED ORDER — DIPHENHYDRAMINE HCL 50 MG/ML IJ SOLN
INTRAMUSCULAR | Status: AC
  Filled 2021-12-03: qty 1

## 2021-12-03 MED ORDER — FENTANYL CITRATE (PF) 100 MCG/2ML IJ SOLN
INTRAMUSCULAR | Status: AC
  Filled 2021-12-03: qty 2

## 2021-12-03 MED ORDER — SODIUM CHLORIDE 0.9 % IV SOLN: 250.0000 mL | INTRAVENOUS | Status: DC | PRN

## 2021-12-03 MED ORDER — ONDANSETRON HCL 4 MG/2ML IJ SOLN: 4.0000 mg | Freq: Four times a day (QID) | INTRAMUSCULAR | Status: DC | PRN

## 2021-12-03 MED ORDER — CLOPIDOGREL BISULFATE 300 MG PO TABS
ORAL_TABLET | ORAL | Status: DC | PRN
  Administered 2021-12-03: 300 mg via ORAL

## 2021-12-03 MED ORDER — HEPARIN (PORCINE) IN NACL 1000-0.9 UT/500ML-% IV SOLN
INTRAVENOUS | Status: DC | PRN
  Administered 2021-12-03 (×2): 500 mL

## 2021-12-03 MED ORDER — HYDRALAZINE HCL 20 MG/ML IJ SOLN: 5.0000 mg | INTRAMUSCULAR | Status: DC | PRN

## 2021-12-03 MED ORDER — IODIXANOL 320 MG/ML IV SOLN
INTRAVENOUS | Status: DC | PRN
  Administered 2021-12-03: 150 mL

## 2021-12-03 MED ORDER — LABETALOL HCL 5 MG/ML IV SOLN: 10.0000 mg | INTRAVENOUS | Status: DC | PRN

## 2021-12-03 MED ORDER — FENTANYL CITRATE (PF) 100 MCG/2ML IJ SOLN
INTRAMUSCULAR | Status: DC | PRN
  Administered 2021-12-03: 50 ug via INTRAVENOUS

## 2021-12-03 MED ORDER — HEPARIN (PORCINE) IN NACL 1000-0.9 UT/500ML-% IV SOLN
INTRAVENOUS | Status: AC
  Filled 2021-12-03: qty 1000

## 2021-12-03 SURGICAL SUPPLY — 19 items
BALLN CHOCOLATE 3.0X40X150 (BALLOONS) ×3
BALLOON CHOCOLATE 3.0X40X150 (BALLOONS) IMPLANT
CATH OMNI FLUSH 5F 65CM (CATHETERS) ×1 IMPLANT
CATH QUICKCROSS .018X135CM (MICROCATHETER) ×1 IMPLANT
DEVICE CLOSURE MYNXGRIP 6/7F (Vascular Products) ×1 IMPLANT
GLIDEWIRE ADV .035X260CM (WIRE) ×1 IMPLANT
KIT ENCORE 26 ADVANTAGE (KITS) ×1 IMPLANT
KIT MICROPUNCTURE NIT STIFF (SHEATH) ×1 IMPLANT
KIT PV (KITS) ×3 IMPLANT
SHEATH PINNACLE 5F 10CM (SHEATH) ×1 IMPLANT
SHEATH PINNACLE 6F 10CM (SHEATH) ×1 IMPLANT
SHEATH PINNACLE ST 6F 65CM (SHEATH) ×1 IMPLANT
SHEATH PROBE COVER 6X72 (BAG) ×1 IMPLANT
SYR MEDRAD MARK V 150ML (SYRINGE) ×1 IMPLANT
TRANSDUCER W/STOPCOCK (MISCELLANEOUS) ×3 IMPLANT
TRAY PV CATH (CUSTOM PROCEDURE TRAY) ×3 IMPLANT
WIRE BENTSON .035X145CM (WIRE) ×1 IMPLANT
WIRE G V18X300CM (WIRE) ×1 IMPLANT
WIRE SPARTACORE .014X300CM (WIRE) ×2 IMPLANT

## 2021-12-03 NOTE — H&P (Signed)
Office Note   Patient seen and examined in preop holding.  No complaints. No changes to medication history or physical exam since last seen in clinic. After discussing the risks and benefits of bilateral lower extremity angiogram to assess stenotic area within the right sided bypass, as well as define left leg peripheral arterial disease for Rutherford 5 CLI, [name] elected to proceed.   [name] John MD   CC:  follow up Requesting Provider:  No ref. provider found  HPI: [name] is a 66 y.o. (Nov 23, 1955) male who presents status post right below the knee popliteal artery to posterior tibial artery bypass using vein as well as fourth metatarsal ray amputation by Dr. Unk Lightning on 09/15/2021.  Subsequently he underwent transmetatarsal amputation by podiatry on 09/19/2021.  He returns today for bypass surveillance.  He believes the right TMA is healing appropriately.  He denies any rest pain or further tissue loss.  Patient also states that about 1 month ago his left great toenail fell off without trauma and his toe tip is now turning black.  He denies any pain to his foot.  Past medical history significant for insulin-dependent diabetes mellitus.  He denies fevers, chills, nausea/vomiting.  He is taking his Plavix daily.  He is a former smoker.  Past Medical History:  Diagnosis Date   Diabetes mellitus with peripheral vascular disease    Hypertension    Neuropathy    PTSD (post-traumatic stress disorder)    Sleep apnea     Past Surgical History:  Procedure Laterality Date   ABDOMINAL AORTOGRAM W/LOWER EXTREMITY Right 09/10/2021   Procedure: ABDOMINAL AORTOGRAM W/LOWER EXTREMITY;  Surgeon: [name] John, MD;  Location: [facility];  Service: Cardiovascular;  Laterality: Right;   ABDOMINAL HERNIA REPAIR     AMPUTATION Right 09/19/2021   Procedure: TRANSMETATARSAL AMPUTATION FOOT;  Surgeon: Lorenda Peck, MD;  Location: [facility];  Service: Podiatry;  Laterality:  Right;  Surgical team to do local block   AMPUTATION TOE Right 09/15/2021   Procedure: AMPUTATION 4th TOE;  Surgeon: [name] John, MD;  Location: [facility];  Service: Vascular;  Laterality: Right;   BACK SURGERY     FEMORAL-TIBIAL BYPASS GRAFT Right 09/15/2021   Procedure: BYPASS GRAFT BELOW KNEE POPLITEAL ARTERY TO POSTERIOR TIBIAL ARTERY;  Surgeon: [name] John, MD;  Location: [facility];  Service: Vascular;  Laterality: Right;   IRRIGATION AND DEBRIDEMENT FOOT Right 09/17/2021   Procedure: IRRIGATION AND DEBRIDEMENT FOOT;  Surgeon: Lorenda Peck, MD;  Location: [facility];  Service: Podiatry;  Laterality: Right;  Suregeon will do anesthesia block   KNEE SURGERY     PERIPHERAL VASCULAR BALLOON ANGIOPLASTY  09/10/2021   Procedure: PERIPHERAL VASCULAR BALLOON ANGIOPLASTY;  Surgeon: [name] John, MD;  Location: [facility];  Service: Cardiovascular;;  rt sfa pta   SHOULDER SURGERY     VEIN HARVEST Right 09/15/2021   Procedure: GREATER SAPHENOUS VEIN HARVEST;  Surgeon: [name] John, MD;  Location: [facility];  Service: Vascular;  Laterality: Right;    Social History   Socioeconomic History   Marital status: Married    Spouse name: Not on file   Number of children: Not on file   Years of education: Not on file   Highest education level: Not on file  Occupational History   Not on file  Tobacco Use   Smoking status: Former    Types: Cigarettes    Passive exposure: Never   Smokeless tobacco:  Never  Vaping Use   Vaping Use: Never used  Substance and Sexual Activity   Alcohol use: No   Drug use: Never   Sexual activity: Not on file  Other Topics Concern   Not on file  Social History Narrative   Not on file   Social Determinants of Health   Financial Resource Strain: Not on file  Food Insecurity: Not on file  Transportation Needs: Not on file  Physical Activity: Not on file  Stress: Not on file  Social Connections: Not on file  Intimate Partner Violence: Not on file     Family History  Problem Relation Age of Onset   Stroke Mother    Coronary artery disease Father    COPD Brother     Current Facility-Administered Medications  Medication Dose Route Frequency Provider Last Rate Last Admin   0.9 %  sodium chloride infusion   Intravenous Continuous [name] John, MD 100 mL/hr at 12/03/21 1259 New Bag at 12/03/21 1259   0.9 %  sodium chloride infusion   Intravenous Continuous [name] John, MD       clopidogrel (PLAVIX) tablet    PRN [name] John, MD   300 mg at 12/03/21 1501   diphenhydrAMINE (BENADRYL) injection    PRN [name] John, MD   25 mg at 12/03/21 1428   fentaNYL (SUBLIMAZE) injection    PRN [name] John, MD   25 mcg at 12/03/21 1430   Heparin (Porcine) in NaCl 1000-0.9 UT/500ML-% SOLN    PRN [name] John, MD   500 mL at 12/03/21 1348   heparin sodium (porcine) injection    PRN [name] John, MD   3,000 Units at 12/03/21 1421   iodixanol (VISIPAQUE) 320 MG/ML injection    PRN [name] John, MD   150 mL at 12/03/21 1455   lidocaine (PF) (XYLOCAINE) 1 % injection    PRN [name] John, MD   15 mL at 12/03/21 1355   midazolam (VERSED) injection    PRN [name] John, MD   1 mg at 12/03/21 1354    Allergies  Allergen Reactions   Gemfibrozil Other (See Comments)    Doesn't know its been awhile   Oxycontin [Oxycodone Hcl] Other (See Comments)    Bad reaction "took me to a place I never wanna go again"   Pork-Derived Products    Simvastatin Other (See Comments)    Says he doesn't know its been awhile     REVIEW OF SYSTEMS:   '[X]'$  denotes positive finding, '[ ]'$  denotes negative finding Cardiac  Comments:  Chest pain or chest pressure:    Shortness of breath upon exertion:    Short of breath when lying flat:    Irregular heart rhythm:        Vascular    Pain in calf, thigh, or hip brought on by ambulation:    Pain in feet at night that wakes you up from your sleep:     Blood clot in your veins:    Leg  swelling:         Pulmonary    Oxygen at home:    Productive cough:     Wheezing:         Neurologic    Sudden weakness in arms or legs:     Sudden numbness in arms or legs:     Sudden onset of difficulty speaking or slurred speech:    Temporary loss of vision in  one eye:     Problems with dizziness:         Gastrointestinal    Blood in stool:     Vomited blood:         Genitourinary    Burning when urinating:     Blood in urine:        Psychiatric    Major depression:         Hematologic    Bleeding problems:    Problems with blood clotting too easily:        Skin    Rashes or ulcers:        Constitutional    Fever or chills:      PHYSICAL EXAMINATION:  Vitals:   12/03/21 1444 12/03/21 1449 12/03/21 1454 12/03/21 1459  BP: (!) 165/76 (!) 162/77 (!) 169/78 (!) 144/80  Pulse: 78 79 78 78  Resp: '15 14 14 16  '$ Temp:      TempSrc:      SpO2: 99% 99% 99% 98%  Weight:      Height:        General:  WDWN in NAD; vital signs documented above Gait: Not observed HENT: WNL, normocephalic Pulmonary: normal non-labored breathing , without Rales, rhonchi,  wheezing Cardiac: regular HR Abdomen: soft, NT, no masses Skin: without rashes Vascular Exam/Pulses: Patient deferred exam of right TMA due to recent dressing change with home health nurse; left great toe pictured below Extremities:     Musculoskeletal: no muscle wasting or atrophy  Neurologic: A&O X 3;  No focal weakness or paresthesias are detected Psychiatric:  The pt has Normal affect.   Non-Invasive Vascular Imaging:   Patent bypass of right leg however patient has a 556 cm/s in the mid to distal graft as well as low flow volumes elsewhere  ABI/TBIToday's ABIToday's TBIPrevious ABIPrevious TBI  +-------+-----------+-----------+------------+------------+  Right  0.7        amp        0.55        absent        +-------+-----------+-----------+------------+------------+  Left   0.3         gangrene   0.29        absent        +-------+-----------+-----------+------------+------------+   ASSESSMENT/PLAN:: 66 y.o. male here for follow up for surveillance of bypass graft; new left great toe gangrene  -Based on arterial duplex, bypass graft the right leg is patent however is threatened given a high-grade stenosis in the mid to distal graft.  Plan will be for aortogram with right leg runoff to address area of stenosis.  During our exam patient also mentioned a wound of his left great toe.  The left great toe is pictured above with dry gangrene.  He has an ABI of 0.3 on the left with a toe pressure of 0.  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  Case was discussed with Dr. Virl Cagey who will attempt to study the left leg in addition to addressing the threatened bypass on the right leg.  Angiography will take place on Wednesday, 11/26/2021.  Procedure was discussed in detail with the patient including risks and he agrees to proceed.  He will continue Plavix perioperatively.   [name] John, PA-C [facility] [phone]  Clinic MD:   Virl Cagey

## 2021-12-03 NOTE — Progress Notes (Signed)
Lower extremity vein mapping completed. Refer to "CV Proc" under chart review to view preliminary results.  12/03/2021 4:02 PM Kelby Aline., MHA, RVT, RDCS, RDMS

## 2021-12-03 NOTE — Op Note (Signed)
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  Ultrasound-guided micropuncture access of the left common femoral artery 2.  Aortogram 3.  Second-order cannulation, right lower extremity angiogram 4.  Balloon angioplasty using a chocolate balloon 3 x 20m of retained valve and previous bypass 5.  Left lower extremity angiogram 6.  Moderate sedation time 59 minutes 7.  Contrast volume 150 mL  Indications: Patient is a 66year old male who is well-known to my practice having previously undergone right-sided popliteal to posterior tibial artery bypass using ipsilateral, nonreversed greater saphenous vein.  Ultrasound studies postoperatively demonstrated high velocities focally within the bypass concerning for retained valve.  At the same visit, NNigelwas found to have left-sided Rutherford 5 critical limb ischemia.  After discussing the risk and benefits of bilateral lower extremity angiogram-right-sided 4 assisted bypass patency, left-sided for Rutherford 5 critical limb ischemia, and is elected to proceed.  Findings:  Aortogram: Bilateral renal arteries patent, no flow-limiting stenosis appreciated in the aortoiliac segment  On the right: Widely patent common femoral artery, profunda, mild disease throughout the superficial femoral artery, no flow-limiting stenosis.  No flow-limiting stenosis in the popliteal artery.  Proximal anastomosis of the popliteal to posterior tibial artery bypass widely patent.  Small area of focal stenosis greater than 95% appreciated in the mid bypass.  No stenosis appreciated distally.  Excellent posterior tibial runoff into the foot.  On the left: Widely patent common femoral artery,  profunda.  Two tandem, tapering stenoses appreciated in the superficial femoral artery with occlusion.  Popliteal artery without flow-limiting disease.  No named vessels in the calf with reconstitution at the distal posterior tibial artery roughly 15 cm from the malleolus.  This is sizable, and continues into the foot   Procedure:  The patient was identified in the holding area and taken to room 8.  The patient was then placed supine on the table and prepped and draped in the usual sterile fashion.  A time out was called.  Ultrasound was used to evaluate the left common femoral artery.  It was patent .  A digital ultrasound image was acquired.  A micropuncture needle was used to access the left common femoral artery under ultrasound guidance.  An 018 wire was advanced without resistance and a micropuncture sheath was placed.  The 018 wire was removed and a benson wire was placed.  The micropuncture sheath was exchanged for a 5 french sheath.  An omniflush catheter was advanced over the wire to the level of L-1.  An abdominal angiogram was obtained.  Next, using the omniflush catheter and a benson wire, the aortic bifurcation was crossed and the catheter was placed into theright external iliac artery and right runoff was obtained.  left runoff was performed via retrograde sheath injections.  I elected to intervene on the retained bypass valve.  A 6 x 65 cm sheath was brought onto the field and parked in the right superficial femoral artery from this location a series of wires and catheters were used to select the bypass and cross the retained valve.  Next, a 3 x 40 mm chocolate balloon was brought onto the field and expanded across the lesion.  Several inflations followed.  The balloon was expanded to profile and flossed across the valve.  Follow-up angiography demonstrated an excellent result with resolution of the stenosis.    Impression: Successful balloon angioplasty and valve lysis using a 3 x 40 mm  chocolate balloon. In the left leg, patient has Rutherford 5 critical limb ischemia with multilevel occlusive disease.  He would benefit from a hybrid repair as he has a small amount of usable vein in the calf.  My plan is for SFA stenting with popliteal to posterior tibial artery bypass using ipsilateral GSV.    Cassandria Santee, MD [facility] [place] Office: [phone]

## 2021-12-05 ENCOUNTER — Telehealth: Payer: Self-pay | Admitting: *Deleted

## 2021-12-05 ENCOUNTER — Other Ambulatory Visit: Payer: Self-pay

## 2021-12-05 ENCOUNTER — Encounter (HOSPITAL_COMMUNITY): Payer: Self-pay | Admitting: Vascular Surgery

## 2021-12-05 DIAGNOSIS — I70223 Atherosclerosis of native arteries of extremities with rest pain, bilateral legs: Secondary | ICD-10-CM

## 2021-12-05 NOTE — Telephone Encounter (Signed)
Yes will continue wound care on right foot. Will evaluate the toe at next visit. If any worsening in meantime advise to go to emergency room. Thanks

## 2021-12-05 NOTE — Telephone Encounter (Signed)
Called Glenard Haring w/ [facility] giving verbal orders to continue skilled nursing and continue wound care for the right foot/ other recommendations per Dr Blenda Mounts, verbalized understanding.

## 2021-12-05 NOTE — Telephone Encounter (Addendum)
Nurse w/ [facility]-[phone],fax # [phone] is calling to request verbal orders to continue skilled nursing, has been seeing patient for wound care one month for right foot surgical amputation site.   She is also reporting that the patient has black gangrene looking of the right great toe,currently being seen at wound center([facility])biweekly for the left foot.  First noticed and patient said that it's been like that since toenail fell off 30 days ago.Please advise. He is currently taking Cephalexin-500 mg, 4 times daily

## 2021-12-08 ENCOUNTER — Encounter (HOSPITAL_COMMUNITY)
Admission: RE | Disposition: A | Discharge status: Home / Self Care | Payer: Self-pay | Source: Home / Self Care | Attending: Vascular Surgery

## 2021-12-08 ENCOUNTER — Ambulatory Visit (HOSPITAL_COMMUNITY)
Admission: RE | Admit: 2021-12-08 | Discharge: 2021-12-08 | Disposition: A | Discharge status: Home / Self Care | Payer: Medicare HMO | Attending: Vascular Surgery | Admitting: Vascular Surgery

## 2021-12-08 DIAGNOSIS — I10 Essential (primary) hypertension: Secondary | ICD-10-CM | POA: Diagnosis not present

## 2021-12-08 DIAGNOSIS — E1140 Type 2 diabetes mellitus with diabetic neuropathy, unspecified: Secondary | ICD-10-CM | POA: Diagnosis not present

## 2021-12-08 DIAGNOSIS — I70362 Atherosclerosis of unspecified type of bypass graft(s) of the extremities with gangrene, left leg: Secondary | ICD-10-CM | POA: Insufficient documentation

## 2021-12-08 DIAGNOSIS — Z87891 Personal history of nicotine dependence: Secondary | ICD-10-CM | POA: Insufficient documentation

## 2021-12-08 DIAGNOSIS — I70262 Atherosclerosis of native arteries of extremities with gangrene, left leg: Secondary | ICD-10-CM | POA: Diagnosis not present

## 2021-12-08 DIAGNOSIS — E1152 Type 2 diabetes mellitus with diabetic peripheral angiopathy with gangrene: Secondary | ICD-10-CM | POA: Diagnosis present

## 2021-12-08 DIAGNOSIS — I70223 Atherosclerosis of native arteries of extremities with rest pain, bilateral legs: Secondary | ICD-10-CM

## 2021-12-08 HISTORY — PX: CORONARY ATHERECTOMY: CATH118238

## 2021-12-08 HISTORY — PX: LOWER EXTREMITY ANGIOGRAPHY: CATH118251

## 2021-12-08 HISTORY — PX: PERIPHERAL VASCULAR INTERVENTION: CATH118257

## 2021-12-08 LAB — POCT I-STAT, CHEM 8
BUN: 13 mg/dL (ref 8–23)
Calcium, Ion: 1.29 mmol/L (ref 1.15–1.40)
Chloride: 104 mmol/L (ref 98–111)
Creatinine, Ser: 0.8 mg/dL (ref 0.61–1.24)
Glucose, Bld: 163 mg/dL — ABNORMAL HIGH (ref 70–99)
HCT: 37 % — ABNORMAL LOW (ref 39.0–52.0)
Hemoglobin: 12.6 g/dL — ABNORMAL LOW (ref 13.0–17.0)
Potassium: 4.1 mmol/L (ref 3.5–5.1)
Sodium: 139 mmol/L (ref 135–145)
TCO2: 24 mmol/L (ref 22–32)

## 2021-12-08 LAB — GLUCOSE, CAPILLARY
Glucose-Capillary: 150 mg/dL — ABNORMAL HIGH (ref 70–99)
Glucose-Capillary: 60 mg/dL — ABNORMAL LOW (ref 70–99)
Glucose-Capillary: 87 mg/dL (ref 70–99)

## 2021-12-08 SURGERY — LOWER EXTREMITY ANGIOGRAPHY
Anesthesia: LOCAL

## 2021-12-08 MED ORDER — SODIUM CHLORIDE 0.9 % IV SOLN: INTRAVENOUS | Status: DC

## 2021-12-08 MED ORDER — SODIUM CHLORIDE 0.9 % WEIGHT BASED INFUSION: 1.0000 mL/kg/h | INTRAVENOUS | Status: DC

## 2021-12-08 MED ORDER — LIDOCAINE HCL (PF) 1 % IJ SOLN
INTRAMUSCULAR | Status: DC | PRN
  Administered 2021-12-08: 18 mL
  Administered 2021-12-08: 3 mL

## 2021-12-08 MED ORDER — FENTANYL CITRATE (PF) 100 MCG/2ML IJ SOLN
INTRAMUSCULAR | Status: DC | PRN
  Administered 2021-12-08 (×2): 50 ug via INTRAVENOUS

## 2021-12-08 MED ORDER — SODIUM CHLORIDE 0.9% FLUSH: 3.0000 mL | Freq: Two times a day (BID) | INTRAVENOUS | Status: DC

## 2021-12-08 MED ORDER — SODIUM CHLORIDE 0.9 % IV SOLN: 250.0000 mL | INTRAVENOUS | Status: DC | PRN

## 2021-12-08 MED ORDER — CLOPIDOGREL BISULFATE 300 MG PO TABS
ORAL_TABLET | ORAL | Status: AC
  Filled 2021-12-08: qty 1

## 2021-12-08 MED ORDER — MIDAZOLAM HCL 2 MG/2ML IJ SOLN
INTRAMUSCULAR | Status: DC | PRN
  Administered 2021-12-08: 1 mg via INTRAVENOUS

## 2021-12-08 MED ORDER — HEPARIN (PORCINE) IN NACL 1000-0.9 UT/500ML-% IV SOLN
INTRAVENOUS | Status: DC | PRN
  Administered 2021-12-08 (×2): 500 mL

## 2021-12-08 MED ORDER — HEPARIN SODIUM (PORCINE) 1000 UNIT/ML IJ SOLN
INTRAMUSCULAR | Status: DC | PRN
  Administered 2021-12-08: 10000 [IU] via INTRAVENOUS

## 2021-12-08 MED ORDER — CLOPIDOGREL BISULFATE 300 MG PO TABS
ORAL_TABLET | ORAL | Status: DC | PRN
  Administered 2021-12-08: 300 mg via ORAL

## 2021-12-08 MED ORDER — LABETALOL HCL 5 MG/ML IV SOLN: 10.0000 mg | INTRAVENOUS | Status: DC | PRN

## 2021-12-08 MED ORDER — MIDAZOLAM HCL 2 MG/2ML IJ SOLN
INTRAMUSCULAR | Status: AC
  Filled 2021-12-08: qty 2

## 2021-12-08 MED ORDER — MORPHINE SULFATE (PF) 2 MG/ML IV SOLN: 2.0000 mg | INTRAVENOUS | Status: DC | PRN

## 2021-12-08 MED ORDER — LIDOCAINE HCL (PF) 1 % IJ SOLN
INTRAMUSCULAR | Status: AC
Start: 2021-12-08 — End: ?
  Filled 2021-12-08: qty 30

## 2021-12-08 MED ORDER — ONDANSETRON HCL 4 MG/2ML IJ SOLN: 4.0000 mg | Freq: Four times a day (QID) | INTRAMUSCULAR | Status: DC | PRN

## 2021-12-08 MED ORDER — HEPARIN (PORCINE) IN NACL 1000-0.9 UT/500ML-% IV SOLN
INTRAVENOUS | Status: AC
Start: 2021-12-08 — End: ?
  Filled 2021-12-08: qty 1000

## 2021-12-08 MED ORDER — FENTANYL CITRATE (PF) 100 MCG/2ML IJ SOLN
INTRAMUSCULAR | Status: AC
  Filled 2021-12-08: qty 2

## 2021-12-08 MED ORDER — HYDRALAZINE HCL 20 MG/ML IJ SOLN: 5.0000 mg | INTRAMUSCULAR | Status: DC | PRN

## 2021-12-08 MED ORDER — ACETAMINOPHEN 325 MG PO TABS: 650.0000 mg | ORAL_TABLET | ORAL | Status: DC | PRN

## 2021-12-08 MED ORDER — CLOPIDOGREL BISULFATE 75 MG PO TABS: 75.0000 mg | ORAL_TABLET | Freq: Every day | ORAL | Status: DC

## 2021-12-08 MED ORDER — IODIXANOL 320 MG/ML IV SOLN
INTRAVENOUS | Status: DC | PRN
  Administered 2021-12-08: 120 mL

## 2021-12-08 MED ORDER — CLOPIDOGREL BISULFATE 75 MG PO TABS: 300.0000 mg | ORAL_TABLET | Freq: Once | ORAL | Status: DC

## 2021-12-08 MED ORDER — SODIUM CHLORIDE 0.9% FLUSH: 3.0000 mL | INTRAVENOUS | Status: DC | PRN

## 2021-12-08 SURGICAL SUPPLY — 30 items
BALLN COYOTE OTW 3X220X150 (BALLOONS) ×3
BALLN STERLING OTW 5X150X150 (BALLOONS) ×3
BALLOON COYOTE OTW 3X220X150 (BALLOONS) IMPLANT
BALLOON STERLING OTW 5X150X150 (BALLOONS) IMPLANT
BNDG HMST LF TOP THROMBIX (HEMOSTASIS) ×2
CATH AURYON 5FR ATHEREC 1.5 (CATHETERS) ×1 IMPLANT
CATH CXI 2.6F 65 ST (CATHETERS) ×3
CATH OMNI FLUSH 5F 65CM (CATHETERS) ×1 IMPLANT
CATH QUICKCROSS SUPP .035X90CM (MICROCATHETER) ×1 IMPLANT
CATH SPRT STRG 65X2.6FR ACPT (CATHETERS) IMPLANT
CLOSURE MYNX CONTROL 6F/7F (Vascular Products) ×1 IMPLANT
DEVICE ONE SNARE 10MM (MISCELLANEOUS) ×1 IMPLANT
GLIDEWIRE ADV .035X260CM (WIRE) ×1 IMPLANT
KIT ENCORE 26 ADVANTAGE (KITS) ×1 IMPLANT
KIT MICROPUNCTURE NIT STIFF (SHEATH) ×1 IMPLANT
KIT PV (KITS) ×3 IMPLANT
PATCH THROMBIX TOPICAL PLAIN (HEMOSTASIS) ×1 IMPLANT
SHEATH FLEX ANSEL ANG 6F 45CM (SHEATH) ×1 IMPLANT
SHEATH MICROPUNCTURE PEDAL 5FR (SHEATH) ×1 IMPLANT
SHEATH PINNACLE 5F 10CM (SHEATH) ×1 IMPLANT
SHEATH PINNACLE 6F 10CM (SHEATH) ×1 IMPLANT
SHEATH PROBE COVER 6X72 (BAG) ×1 IMPLANT
STENT ELUVIA 6X150X130 (Permanent Stent) ×2 IMPLANT
STENT ELUVIA 6X80X130 (Permanent Stent) ×2 IMPLANT
SYR MEDRAD MARK V 150ML (SYRINGE) ×1 IMPLANT
TRANSDUCER W/STOPCOCK (MISCELLANEOUS) ×3 IMPLANT
TRAY PV CATH (CUSTOM PROCEDURE TRAY) ×3 IMPLANT
WIRE BENTSON .035X145CM (WIRE) ×1 IMPLANT
WIRE G V18X300CM (WIRE) ×1 IMPLANT
WIRE SPARTACORE .014X300CM (WIRE) ×1 IMPLANT

## 2021-12-08 NOTE — H&P (View-Only) (Signed)
HPI: [name] is a 66 y.o. ([birth year]) male who presents status post right below the knee popliteal artery to posterior tibial artery bypass using vein as well as fourth metatarsal ray amputation by Dr. Virl Cagey on 09/15/2021.  Subsequently he underwent transmetatarsal amputation by podiatry on 09/19/2021.  He returns today for bypass surveillance.  He believes the right TMA is healing appropriately.  He denies any rest pain or further tissue loss.   Patient also states that about 1 month ago his left great toenail fell off without trauma and his toe tip is now turning black.  He denies any pain to his foot.  Past medical history significant for insulin-dependent diabetes mellitus.  He denies fevers, chills, nausea/vomiting.  He is taking his Plavix daily.  He is a former smoker.       Past Medical History:  Diagnosis Date   Diabetes mellitus with peripheral vascular disease     Hypertension     Neuropathy     PTSD (post-traumatic stress disorder)     Sleep apnea             Past Surgical History:  Procedure Laterality Date   ABDOMINAL AORTOGRAM W/LOWER EXTREMITY Right 09/10/2021    Procedure: ABDOMINAL AORTOGRAM W/LOWER EXTREMITY;  Surgeon: [name], MD;  Location: [facility];  Service: Cardiovascular;  Laterality: Right;   ABDOMINAL HERNIA REPAIR       AMPUTATION Right 09/19/2021    Procedure: TRANSMETATARSAL AMPUTATION FOOT;  Surgeon: Lorenda Peck, MD;  Location: [facility];  Service: Podiatry;  Laterality: Right;  Surgical team to do local block   AMPUTATION TOE Right 09/15/2021    Procedure: AMPUTATION 4th TOE;  Surgeon: [name], MD;  Location: [facility];  Service: Vascular;  Laterality: Right;   BACK SURGERY       FEMORAL-TIBIAL BYPASS GRAFT Right 09/15/2021    Procedure: BYPASS GRAFT BELOW KNEE POPLITEAL ARTERY TO POSTERIOR TIBIAL ARTERY;  Surgeon: [name], MD;  Location: [facility];  Service: Vascular;  Laterality: Right;   IRRIGATION AND DEBRIDEMENT FOOT  Right 09/17/2021    Procedure: IRRIGATION AND DEBRIDEMENT FOOT;  Surgeon: Lorenda Peck, MD;  Location: [facility];  Service: Podiatry;  Laterality: Right;  Suregeon will do anesthesia block   KNEE SURGERY       PERIPHERAL VASCULAR BALLOON ANGIOPLASTY   09/10/2021    Procedure: PERIPHERAL VASCULAR BALLOON ANGIOPLASTY;  Surgeon: [name], MD;  Location: [facility];  Service: Cardiovascular;;  rt sfa pta   SHOULDER SURGERY       VEIN HARVEST Right 09/15/2021    Procedure: GREATER SAPHENOUS VEIN HARVEST;  Surgeon: [name], MD;  Location: [facility];  Service: Vascular;  Laterality: Right;      Social History         Socioeconomic History   Marital status: Married      Spouse name: Not on file   Number of children: Not on file   Years of education: Not on file   Highest education level: Not on file  Occupational History   Not on file  Tobacco Use   Smoking status: Former      Types: Cigarettes      Passive exposure: Never   Smokeless tobacco: Never  Vaping Use   Vaping Use: Never used  Substance and Sexual Activity   Alcohol use: No   Drug use: Never   Sexual activity: Not on file  Other Topics Concern  Not on file  Social History Narrative   Not on file    Social Determinants of Health    Financial Resource Strain: Not on file  Food Insecurity: Not on file  Transportation Needs: Not on file  Physical Activity: Not on file  Stress: Not on file  Social Connections: Not on file  Intimate Partner Violence: Not on file           Family History  Problem Relation Age of Onset   Stroke Mother     Coronary artery disease Father     COPD Brother                 Current Facility-Administered Medications  Medication Dose Route Frequency Provider Last Rate Last Admin   0.9 %  sodium chloride infusion   Intravenous Continuous [name], MD 100 mL/hr at 12/03/21 1259 New Bag at 12/03/21 1259   0.9 %  sodium chloride infusion   Intravenous Continuous  [name], MD       clopidogrel (PLAVIX) tablet     PRN [name], MD   300 mg at 12/03/21 1501   diphenhydrAMINE (BENADRYL) injection     PRN [name], MD   25 mg at 12/03/21 1428   fentaNYL (SUBLIMAZE) injection     PRN [name], MD   25 mcg at 12/03/21 1430   Heparin (Porcine) in NaCl 1000-0.9 UT/500ML-% SOLN     PRN [name], MD   500 mL at 12/03/21 1348   heparin sodium (porcine) injection     PRN [name], MD   3,000 Units at 12/03/21 1421   iodixanol (VISIPAQUE) 320 MG/ML injection     PRN [name], MD   150 mL at 12/03/21 1455   lidocaine (PF) (XYLOCAINE) 1 % injection     PRN [name], MD   15 mL at 12/03/21 1355   midazolam (VERSED) injection     PRN [name], MD   1 mg at 12/03/21 1354           Allergies  Allergen Reactions   Gemfibrozil Other (See Comments)      Doesn't know its been awhile   Oxycontin [Oxycodone Hcl] Other (See Comments)      Bad reaction "took me to a place I never wanna go again"   Pork-Derived Products     Simvastatin Other (See Comments)      Says he doesn't know its been awhile        REVIEW OF SYSTEMS:    '[X]'$  denotes positive finding, '[ ]'$  denotes negative finding Cardiac   Comments:  Chest pain or chest pressure:      Shortness of breath upon exertion:      Short of breath when lying flat:      Irregular heart rhythm:             Vascular      Pain in calf, thigh, or hip brought on by ambulation:      Pain in feet at night that wakes you up from your sleep:       Blood clot in your veins:      Leg swelling:              Pulmonary      Oxygen at home:      Productive cough:       Wheezing:  Neurologic      Sudden weakness in arms or legs:       Sudden numbness in arms or legs:       Sudden onset of difficulty speaking or slurred speech:      Temporary loss of vision in one eye:       Problems with dizziness:              Gastrointestinal      Blood  in stool:       Vomited blood:              Genitourinary      Burning when urinating:       Blood in urine:             Psychiatric      Major depression:              Hematologic      Bleeding problems:      Problems with blood clotting too easily:             Skin      Rashes or ulcers:             Constitutional      Fever or chills:          PHYSICAL EXAMINATION:   Vitals:   12/08/21 1311  BP: 122/61  Pulse: 61  Temp: 98 F (36.7 C)  SpO2: 98%    General:  WDWN in NAD; vital signs documented above Gait: Not observed HENT: WNL, normocephalic Pulmonary: normal non-labored breathing , without Rales, rhonchi,  wheezing Cardiac: regular HR Abdomen: soft, NT, no masses Skin: without rashes Vascular Exam/Pulses: Patient deferred exam of right TMA due to recent dressing change with home health nurse; left great toe pictured below Extremities:      Musculoskeletal: no muscle wasting or atrophy       Neurologic: A&O X 3;  No focal weakness or paresthesias are detected Psychiatric:  The pt has Normal affect.     Non-Invasive Vascular Imaging:   Patent bypass of right leg however patient has a 556 cm/s in the mid to distal graft as well as low flow volumes elsewhere   ABI/TBIToday's ABIToday's TBIPrevious ABIPrevious TBI  +-------+-----------+-----------+------------+------------+  Right  0.7        amp        0.55        absent        +-------+-----------+-----------+------------+------------+  Left   0.3        gangrene   0.29        absent        +-------+-----------+-----------+------------+------------+     ASSESSMENT/PLAN:: 66 y.o. male here for follow up for surveillance of bypass graft; new left great toe gangrene. Plan angiogram today today to evaluate left lower extremity.     Andrue Dini C. Donzetta Matters, MD [facility] [place] Office: [phone] Pager: [phone]

## 2021-12-08 NOTE — Progress Notes (Signed)
CBG 60 and client given soda

## 2021-12-08 NOTE — Op Note (Signed)
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 Donzetta Matters, MD Procedure Performed: 1.  Ultrasound-guided cannulation right common femoral artery 2.  Selection of left common femoral artery and left lower extremity angiography 3.  Ultrasound-guided cannulation of the left posterior tibial artery 4.  Laser athrectomy with 1.55m [place] left posterior tibial artery and 3 mm balloon angioplasty 5.  Stent of left SFA with 6 x 150, 6 x 150 and 6 x 80 mm Elluvia postdilated with 5 mm balloon 6.  Mynx device closure right common femoral artery 7.  Moderate sedation with fentanyl and Versed for 90 minutes   Indications: 66year old male with previous right lower extremity bypass recently underwent balloon angioplasty of a retained valve.  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  He is indicated for angiography.  Wi-Fi = 6  Findings: The left common femoral artery was healthy with profunda very large and the dominant flow in the thigh.  The SFA was very diminutive in takeoff and then chronically occluded for a long segment and after stenting this was reduced to 0% stenosis.  Posterior tibial artery is the only runoff to the foot and after laser arthrectomy and balloon angioplasty where this was occluded now was reduced to 0% stenosis.  At completion the sheath in the left posterior tibial artery had a mean arterial pressure of 85 mmHg.   Procedure:  The patient was identified in the holding area and taken to room 8.  The patient was then placed supine on the table and prepped and draped in the usual sterile fashion.  A time out was called.  Ultrasound was used to evaluate the right common femoral artery which was patent and compressible.  There is  no spasm percent lidocaine cannulated by functional followed by wire and the sheath.  Images saved the permanent record.  Concomitantly fentanyl and Versed was administered and his vital signs were monitored throughout the case by nursing.  A Bentson wire was placed followed by a 5 FPakistansheath and an Omni catheter was used to cross the bifurcation with a Glidewire advantage placed the Omni catheter in the left common femoral artery for left lower extremity angiography.  We then exchanged over Glidewire advantage for a long 6 FPakistansheath patient was fully heparinized.  I then crossed the occluded SFA down to the level of the popliteal artery perform angiography and this demonstrated long segment occlusion of the posterior tibial artery.  Given the long segment difficulty to cross from above I elected to prep out the left foot.  Ultrasound was used to then identify the left posterior tibial artery which is very large at the ankle.  This area was anesthetized cannulated with micropuncture needle followed by wire and a 4 French micro sheath.  We confirmed intraluminal access.  We then used a V18 and CXI catheter to cross retrograde and this did cross very easily into the SFA and we then snared a [place] core 014 wire and pulled through and through access.  We began with laser arthrectomy posterior tibial artery followed by plain balloon angioplasty and completion demonstrated great flow with no residual stenosis no dissection.  We then primarily stented the left SFA with 3 drug-eluting stents and postdilated with 5 mm balloons.  Completion demonstrated  no residual dissection or stenosis.  Pressure at the ankle now measured 85 mmHg is a mean arterial pressure.  We then remove the wire.  Sheath was pulled in the ankle and pressure held.  We performed retrograde angiography from the right groin elected to exchanged for short 6 French sheath and deployed a minx device which deployed well.  He did tolerate procedure well  without immediate complications  Contrast: 120cc  Heaven Meeker C. Donzetta Matters, MD [facility] Office: [phone] Pager: [phone]

## 2021-12-08 NOTE — H&P (Signed)
HPI: [name] is a 66 y.o. (08/07/1955) male who presents status post right below the knee popliteal artery to posterior tibial artery bypass using vein as well as fourth metatarsal ray amputation by Dr. Virl Cagey on 09/15/2021.  Subsequently he underwent transmetatarsal amputation by podiatry on 09/19/2021.  He returns today for bypass surveillance.  He believes the right TMA is healing appropriately.  He denies any rest pain or further tissue loss.   Patient also states that about 1 month ago his left great toenail fell off without trauma and his toe tip is now turning black.  He denies any pain to his foot.  Past medical history significant for insulin-dependent diabetes mellitus.  He denies fevers, chills, nausea/vomiting.  He is taking his Plavix daily.  He is a former smoker.       Past Medical History:  Diagnosis Date   Diabetes mellitus with peripheral vascular disease     Hypertension     Neuropathy     PTSD (post-traumatic stress disorder)     Sleep apnea             Past Surgical History:  Procedure Laterality Date   ABDOMINAL AORTOGRAM W/LOWER EXTREMITY Right 09/10/2021    Procedure: ABDOMINAL AORTOGRAM W/LOWER EXTREMITY;  Surgeon: [name], MD;  Location: [facility];  Service: Cardiovascular;  Laterality: Right;   ABDOMINAL HERNIA REPAIR       AMPUTATION Right 09/19/2021    Procedure: TRANSMETATARSAL AMPUTATION FOOT;  Surgeon: Lorenda Peck, MD;  Location: [facility];  Service: Podiatry;  Laterality: Right;  Surgical team to do local block   AMPUTATION TOE Right 09/15/2021    Procedure: AMPUTATION 4th TOE;  Surgeon: [name], MD;  Location: [place];  Service: Vascular;  Laterality: Right;   BACK SURGERY       FEMORAL-TIBIAL BYPASS GRAFT Right 09/15/2021    Procedure: BYPASS GRAFT BELOW KNEE POPLITEAL ARTERY TO POSTERIOR TIBIAL ARTERY;  Surgeon: [name], MD;  Location: [facility];  Service: Vascular;  Laterality: Right;   IRRIGATION AND DEBRIDEMENT FOOT  Right 09/17/2021    Procedure: IRRIGATION AND DEBRIDEMENT FOOT;  Surgeon: Lorenda Peck, MD;  Location: [place];  Service: Podiatry;  Laterality: Right;  Suregeon will do anesthesia block   KNEE SURGERY       PERIPHERAL VASCULAR BALLOON ANGIOPLASTY   09/10/2021    Procedure: PERIPHERAL VASCULAR BALLOON ANGIOPLASTY;  Surgeon: [name], MD;  Location: [facility];  Service: Cardiovascular;;  rt sfa pta   SHOULDER SURGERY       VEIN HARVEST Right 09/15/2021    Procedure: GREATER SAPHENOUS VEIN HARVEST;  Surgeon: [name], MD;  Location: [facility];  Service: Vascular;  Laterality: Right;      Social History         Socioeconomic History   Marital status: Married      Spouse name: Not on file   Number of children: Not on file   Years of education: Not on file   Highest education level: Not on file  Occupational History   Not on file  Tobacco Use   Smoking status: Former      Types: Cigarettes      Passive exposure: Never   Smokeless tobacco: Never  Vaping Use   Vaping Use: Never used  Substance and Sexual Activity   Alcohol use: No   Drug use: Never   Sexual activity: Not on file  Other Topics Concern  Not on file  Social History Narrative   Not on file    Social Determinants of Health    Financial Resource Strain: Not on file  Food Insecurity: Not on file  Transportation Needs: Not on file  Physical Activity: Not on file  Stress: Not on file  Social Connections: Not on file  Intimate Partner Violence: Not on file           Family History  Problem Relation Age of Onset   Stroke Mother     Coronary artery disease Father     COPD Brother                 Current Facility-Administered Medications  Medication Dose Route Frequency Provider Last Rate Last Admin   0.9 %  sodium chloride infusion   Intravenous Continuous [name], MD 100 mL/hr at 12/03/21 1259 New Bag at 12/03/21 1259   0.9 %  sodium chloride infusion   Intravenous Continuous  [name], MD       clopidogrel (PLAVIX) tablet     PRN [name], MD   300 mg at 12/03/21 1501   diphenhydrAMINE (BENADRYL) injection     PRN [name], MD   25 mg at 12/03/21 1428   fentaNYL (SUBLIMAZE) injection     PRN [name], MD   25 mcg at 12/03/21 1430   Heparin (Porcine) in NaCl 1000-0.9 UT/500ML-% SOLN     PRN [name], MD   500 mL at 12/03/21 1348   heparin sodium (porcine) injection     PRN [name], MD   3,000 Units at 12/03/21 1421   iodixanol (VISIPAQUE) 320 MG/ML injection     PRN [name], MD   150 mL at 12/03/21 1455   lidocaine (PF) (XYLOCAINE) 1 % injection     PRN [name], MD   15 mL at 12/03/21 1355   midazolam (VERSED) injection     PRN [name], MD   1 mg at 12/03/21 1354           Allergies  Allergen Reactions   Gemfibrozil Other (See Comments)      Doesn't know its been awhile   Oxycontin [Oxycodone Hcl] Other (See Comments)      Bad reaction "took me to a place I never wanna go again"   Pork-Derived Products     Simvastatin Other (See Comments)      Says he doesn't know its been awhile        REVIEW OF SYSTEMS:    '[X]'$  denotes positive finding, '[ ]'$  denotes negative finding Cardiac   Comments:  Chest pain or chest pressure:      Shortness of breath upon exertion:      Short of breath when lying flat:      Irregular heart rhythm:             Vascular      Pain in calf, thigh, or hip brought on by ambulation:      Pain in feet at night that wakes you up from your sleep:       Blood clot in your veins:      Leg swelling:              Pulmonary      Oxygen at home:      Productive cough:       Wheezing:  Neurologic      Sudden weakness in arms or legs:       Sudden numbness in arms or legs:       Sudden onset of difficulty speaking or slurred speech:      Temporary loss of vision in one eye:       Problems with dizziness:              Gastrointestinal      Blood  in stool:       Vomited blood:              Genitourinary      Burning when urinating:       Blood in urine:             Psychiatric      Major depression:              Hematologic      Bleeding problems:      Problems with blood clotting too easily:             Skin      Rashes or ulcers:             Constitutional      Fever or chills:          PHYSICAL EXAMINATION:   Vitals:   12/08/21 1311  BP: 122/61  Pulse: 61  Temp: 98 F (36.7 C)  SpO2: 98%    General:  WDWN in NAD; vital signs documented above Gait: Not observed HENT: WNL, normocephalic Pulmonary: normal non-labored breathing , without Rales, rhonchi,  wheezing Cardiac: regular HR Abdomen: soft, NT, no masses Skin: without rashes Vascular Exam/Pulses: Patient deferred exam of right TMA due to recent dressing change with home health nurse; left great toe pictured below Extremities:      Musculoskeletal: no muscle wasting or atrophy       Neurologic: A&O X 3;  No focal weakness or paresthesias are detected Psychiatric:  The pt has Normal affect.     Non-Invasive Vascular Imaging:   Patent bypass of right leg however patient has a 556 cm/s in the mid to distal graft as well as low flow volumes elsewhere   ABI/TBIToday's ABIToday's TBIPrevious ABIPrevious TBI  +-------+-----------+-----------+------------+------------+  Right  0.7        amp        0.55        absent        +-------+-----------+-----------+------------+------------+  Left   0.3        gangrene   0.29        absent        +-------+-----------+-----------+------------+------------+     ASSESSMENT/PLAN:: 66 y.o. male here for follow up for surveillance of bypass graft; new left great toe gangrene. Plan angiogram today today to evaluate left lower extremity.     Jolly Bleicher C. Donzetta Matters, MD [facility] Office: [phone] Pager: [phone]

## 2021-12-09 ENCOUNTER — Encounter (HOSPITAL_COMMUNITY): Payer: Self-pay | Admitting: Vascular Surgery

## 2021-12-09 ENCOUNTER — Telehealth: Payer: Self-pay

## 2021-12-09 ENCOUNTER — Encounter (HOSPITAL_BASED_OUTPATIENT_CLINIC_OR_DEPARTMENT_OTHER): Payer: Medicare HMO | Attending: Internal Medicine | Admitting: Internal Medicine

## 2021-12-09 DIAGNOSIS — E11621 Type 2 diabetes mellitus with foot ulcer: Secondary | ICD-10-CM | POA: Diagnosis present

## 2021-12-09 DIAGNOSIS — E1142 Type 2 diabetes mellitus with diabetic polyneuropathy: Secondary | ICD-10-CM | POA: Insufficient documentation

## 2021-12-09 DIAGNOSIS — E1152 Type 2 diabetes mellitus with diabetic peripheral angiopathy with gangrene: Secondary | ICD-10-CM | POA: Diagnosis not present

## 2021-12-09 DIAGNOSIS — L97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 severity: Secondary | ICD-10-CM | POA: Diagnosis not present

## 2021-12-09 DIAGNOSIS — Z89431 Acquired absence of right foot: Secondary | ICD-10-CM | POA: Insufficient documentation

## 2021-12-10 ENCOUNTER — Encounter: Payer: Self-pay | Admitting: Internal Medicine

## 2021-12-10 ENCOUNTER — Telehealth: Payer: Self-pay

## 2021-12-10 ENCOUNTER — Ambulatory Visit (INDEPENDENT_AMBULATORY_CARE_PROVIDER_SITE_OTHER): Payer: Medicare HMO | Admitting: Internal Medicine

## 2021-12-10 ENCOUNTER — Other Ambulatory Visit: Payer: Self-pay

## 2021-12-10 VITALS — BP 105/65 | HR 65 | Resp 16 | Ht 70.0 in | Wt 217.0 lb

## 2021-12-10 DIAGNOSIS — E1151 Type 2 diabetes mellitus with diabetic peripheral angiopathy without gangrene: Secondary | ICD-10-CM

## 2021-12-10 DIAGNOSIS — L97509 Non-pressure chronic ulcer of other part of unspecified foot with unspecified severity: Secondary | ICD-10-CM

## 2021-12-10 DIAGNOSIS — I739 Peripheral vascular disease, unspecified: Secondary | ICD-10-CM

## 2021-12-10 DIAGNOSIS — T148XXA Other injury of unspecified body region, initial encounter: Secondary | ICD-10-CM | POA: Diagnosis not present

## 2021-12-10 DIAGNOSIS — E11621 Type 2 diabetes mellitus with foot ulcer: Secondary | ICD-10-CM

## 2021-12-10 NOTE — Patient Instructions (Addendum)
You have an open wound now of around 3 months. At some point there seems to be bone exposure although I didn't see it   There was a biopsy that showed "osteomyelitis." This is likely just inflammatory cells in the bone. There was no culture to say it was definitively a bone infection   Your blood sugar has been at baseline and you have been feeling well unlike when you would be sick   At this time, even if it is infected in the bone, we need the surgeon (podiatrist or vascular surgery) to get the wound closed after they potentially need to debride some more bone and send for cultures   If cultures grow bacteria, depends on what the surgeon do, we will have the right antibiotics choice and duration of treatment for you    ---- Stop cephalexin today  If pain/redness of the foot skin and fever chill, call us   Make sure you either see podiatry or vascular surgery next week See [place] again after this is done. In 3-4 weeks

## 2021-12-10 NOTE — Progress Notes (Signed)
[name] for Infectious Disease  Reason for Consult:diabetic/pvd right foot om  Referring Provider: Heber Charlevoix    Patient Active Problem List   Diagnosis Date Noted   At risk for adverse drug event 09/23/2021   Cellulitis in diabetic foot ([place]) 09/13/2021   Dyslipidemia 09/13/2021   Type 2 diabetes mellitus with peripheral neuropathy ([place]) 09/13/2021   Depression 09/13/2021   Essential hypertension 09/13/2021   AKI (acute kidney injury) ([place]) 09/13/2021   GERD without esophagitis 09/13/2021   Obstructive sleep apnea 09/13/2021   Gangrene of toe of right foot ([place]) 09/13/2021      HPI: [name] is a 66 y.o. male PVD, dm2, here for diabetic foot infection  I reviewed epic and outpatient wound clinic referal  Patient has had a right foot ischemic ulcer. He had multiple procedures as below in the past few months  Procedures: 6/05 vascular surgery LLE angiogram for left toe gangrene: laser atherectomy of left posterior tibial artery and angioplasty; stent left SFA 5/31 vascular surgery RLE & LLE angiogram and aortogram: finding see below 3/17 podiatry right TMA 3/15 podiatry right foot I&D 3/13 vascular surgery RLE below-knee popliteal artery to posterior tibial artery bypass using nonreversed greater saphenous vein. 4th MT ray amputation with delayed closure 3/08 vascular surgery aortogram and RLE angioplasty superficial femoral artery   Angiogram finding from 5/31: Findings:  Aortogram: Bilateral renal arteries patent, no flow-limiting stenosis appreciated in the aortoiliac segment   On the right: Widely patent common femoral artery, profunda, mild disease throughout the superficial femoral artery, no flow-limiting stenosis.  No flow-limiting stenosis in the popliteal artery.  Proximal anastomosis of the popliteal to posterior tibial artery bypass widely patent.  Small area of focal stenosis greater than 95% appreciated in the mid bypass.  No stenosis  appreciated distally.  Excellent posterior tibial runoff into the foot.   On the left: Widely patent common femoral artery, profunda.  Two tandem, tapering stenoses appreciated in the superficial femoral artery with occlusion.  Popliteal artery without flow-limiting disease.  No named vessels in the calf with reconstitution at the distal posterior tibial artery roughly 15 cm from the malleolus.  This is sizable, and continues into the foot   He has been seeing wound clinic as well. On 5/04 he had a right foot bone biopsy that showed "acute osteomyelitis." There was no label on the sample to identify what part of the right foot.   From wound clinic note: 10/16/2020 --> "He arrives today with large wound over the entirety of his amputation site extending proximally. The flap closure has failed with some sutures still in place. He is not systemically unwell. Was at snf 4/28 --> dakin's wet-to-dry dressing. At this time discharged home from snf 5/04 --> no systemic signs of infection. Exam -- right foot tma with large wound dehiscence. Exposed bone that appears to be first and third mt. Tissue present is mostly granulating and some nonviable. Foul odor noted. Bone biopsy done    The biopsy again states acute osteomyelitis/no malignancy. There is no actual description or gram stain or any IHC stain report otherwise.  There was no bone culture done  He is here with his wife today  He also reports he has been on cephalexin for a month  Review of Systems: ROS All other ros negative      Past Medical History:  Diagnosis Date   Diabetes mellitus with peripheral vascular disease    Hypertension  Neuropathy    PTSD (post-traumatic stress disorder)    Sleep apnea     Social History   Tobacco Use   Smoking status: Former    Types: Cigarettes    Passive exposure: Never   Smokeless tobacco: Never  Vaping Use   Vaping Use: Never used  Substance Use Topics   Alcohol use: No   Drug  use: Never    Family History  Problem Relation Age of Onset   Stroke Mother    Coronary artery disease Father    COPD Brother     Allergies  Allergen Reactions   Gemfibrozil Other (See Comments)    Doesn't know its been awhile   Oxycontin [Oxycodone Hcl] Other (See Comments)    Bad reaction "took me to a place I never wanna go again"   Pork-Derived Products    Simvastatin Other (See Comments)    Says he doesn't know its been awhile    OBJECTIVE: There were no vitals filed for this visit. There is no height or weight on file to calculate BMI.   Physical Exam General/constitutional: no distress, pleasant HEENT: Normocephalic, PER, Conj Clear, EOMI, Oropharynx clear Neck supple CV: rrr no mrg Lungs: clear to auscultation, normal respiratory effort Abd: Soft, Nontender Ext: no edema Skin: No Rash Neuro: nonfocal Msk: see picture. Clean granulating wound bed. No obvious exposed bone. No purulence. No cellulitis change. No malodor        Lab:  Microbiology:  Serology:  Imaging: I have reviewed and incoroporated imaging finding into decisoin making  09/19/21 xray right foot FINDINGS: Post transmetatarsal amputation of all 5 rays. Resection margins are smooth. Possible soft tissue gas in the overlying dorsal soft tissues of the midfoot, obscured by overlying dressing. No erosive or bony destructive changes of the mid and hindfoot.   IMPRESSION: Prior transmetatarsal amputation of all 5 rays. Possible soft tissue gas in the overlying soft tissues, partially obscured by overlying dressing.    Assessment/plan: Problem List Items Addressed This Visit   None Visit Diagnoses     Open wound    -  Primary   Type 2 diabetes mellitus with foot ulcer, unspecified whether long term insulin use ([place])       PVD (peripheral vascular disease) ([place])             Explains pathogenesis of dm foot infection, diagnosis requirement  Explain to him I am not sure if he  has a bone infection at this time without culture data. Path indicate osteomyelitis which could be reactive inflammatory cells in the bone. The term "acute" leans toward true infection but again need culture  Eitherway, this needs definitive surgical management at least more I&D and Flap coverage otherwise it is not possible to cure this with antibiotics   -see podiatry or surgery to get I&D and bone cx and plan for flap -stop cephalexin (on it now for almost a month) -if redness of skin/fever/chill see me/call me -see me in f/u in 1 month      I have spent a total of 65 minutes of face-to-face and non-face-to-face time, excluding clinical staff time, preparing to see patient, ordering tests and/or medications, and provide counseling the patient    Follow-up: Return in about 4 weeks (around 01/07/2022).  Jabier Mutton, [place] [facility] 12/10/2021, 9:41 AM

## 2021-12-11 NOTE — Telephone Encounter (Signed)
No additional nursing notes.

## 2021-12-11 NOTE — Progress Notes (Addendum)
[name] ([id number]) Visit Report for 12/09/2021 Arrival Information Details Patient Name: Date of Service: [name], [place] [name] [name] [name]. 12/09/2021 11:00 [name] M Medical Record Number: [id number] Patient Account Number: [id number] Date of Birth/Sex: Treating RN: 04/30/1956 (66 y.o. [name], [name] Primary Care [name]: [name] Other Clinician: Referring [name]: Treating [name]/Extender: [name] in Treatment: 7 Visit Information History Since Last Visit Added or deleted any medications: No Patient Arrived: [name] Any new allergies or adverse reactions: No Arrival Time: 11:44 Had [name] fall or experienced change in No Accompanied By: fiance activities of daily living that may affect Transfer Assistance: Manual risk of falls: Patient Requires Transmission-Based Precautions: No Signs or symptoms of abuse/neglect since last visito No Patient Has Alerts: Yes Hospitalized since last visit: No Patient Alerts: Patient on Blood Thinner Implantable device outside of the clinic excluding No 09/14/21 ABI: R=.55 L=.29 cellular tissue based products placed in the center 09/14/21 TBI=Absent since last visit: Has Dressing in Place as Prescribed: Yes Pain Present Now: No Electronic Signature(s) Signed: 12/11/2021 4:55:17 PM By: [name] Hammock RN Entered By: [name] on 12/09/2021 11:45:29 -------------------------------------------------------------------------------- Encounter Discharge Information Details Patient Name: Date of Service: [name], NA [name] [name] [name]. 12/09/2021 11:00 [name] M Medical Record Number: [id number] Patient Account Number: [id number] Date of Birth/Sex: Treating RN: 20-Mar-1956 (66 y.o. [name] Primary Care [name]: [name] Other Clinician: Referring [name]: Treating [name]/Extender: [name] in Treatment: 7 Encounter Discharge Information Items Post Procedure  Vitals Discharge Condition: Stable Temperature (F): 98.1 Ambulatory Status: Walker Pulse (bpm): 71 Discharge Destination: Home Respiratory Rate (breaths/min): 17 Transportation: Private Auto Blood Pressure (mmHg): 123/72 Schedule Follow-up Appointment: Yes Clinical Summary of Care: Provided on 12/09/2021 Form Type Recipient Paper Patient Patient Electronic Signature(s) Signed: 12/11/2021 4:55:17 PM By: [name] Hammock RN Entered By: [name] on 12/09/2021 12:35:51 -------------------------------------------------------------------------------- Lower Extremity Assessment Details Patient Name: Date of Service: [name], NA [name] [name] [name]. 12/09/2021 11:00 [name] M Medical Record Number: [id number] Patient Account Number: [id number] Date of Birth/Sex: Treating RN: 1956-04-24 (66 y.o. [name], [name] Primary Care Shamyah Stantz: [name] Other Clinician: Referring [name]: Treating Senie Lanese/Extender: [name] Weeks in Treatment: 7 Edema Assessment Assessed: [name]: No] [name]: Yes] Edema: [Left: Ye] [Right: s] Calf Left: Right: Point of Measurement: 33 cm From Medial Instep 39.5 cm Ankle Left: Right: Point of Measurement: 10 cm From Medial Instep 22.5 cm Vascular Assessment Pulses: Dorsalis Pedis Palpable: [Right:Yes] Posterior Tibial Palpable: [Right:Yes] Electronic Signature(s) Signed: 12/11/2021 4:55:17 PM By: [name] Hammock RN Entered By: [name] on 12/09/2021 11:51:57 -------------------------------------------------------------------------------- Multi Wound Chart Details Patient Name: Date of Service: [name], NA [name] [name] [name]. 12/09/2021 11:00 [name] M Medical Record Number: [id number] Patient Account Number: [id number] Date of Birth/Sex: Treating RN: Aug 01, 1955 (66 y.o. [name] Primary Care [name]: [name] Other Clinician: Referring [name]: Treating [name]/Extender: [name]  in Treatment: 7 Vital Signs Height(in): 72 Capillary Blood Glucose(mg/dl): 187 Weight(lbs): 216 Pulse(bpm): 61 Body Mass Index(BMI): 29.3 Blood Pressure(mmHg): 123/72 Temperature(F): 98.1 Respiratory Rate(breaths/min): 17 Photos: [1:Right Amputation Site -] [2:Left T Great oe] [3:Left, Lateral Foot] Wound Location: [1:Transmetatarsal Surgical Injury] [2:Gradually Appeared] [3:Gradually Appeared] Wounding Event: [1:Dehisced Wound] [2:Arterial Insufficiency Ulcer] [3:Arterial Insufficiency Ulcer] Primary Etiology: [1:Asthma, Sleep Apnea, Hypertension,] [2:Asthma, Sleep Apnea, Hypertension,] [3:Asthma, Sleep Apnea, Hypertension,] Comorbid History: [1:Peripheral Venous Disease, Type II Diabetes, Osteomyelitis, Neuropathy 09/19/2021] [2:Peripheral Venous Disease, Type II Diabetes, Osteomyelitis, Neuropathy 11/14/2021] [3:Peripheral Venous Disease, Type II Diabetes, Osteomyelitis,  Neuropathy 11/14/2021] Date Acquired: [1:7] [2:0] [3:0] Weeks of Treatment: [1:Open] [2:Open] [3:Open] Wound Status: [1:No] [2:No] [3:No] Wound Recurrence: [1:10x6.4x1.4] [2:4.5x3.5x0.1] [3:1.8x1.9x0.8] Measurements L x W x D (cm) [1:50.265] [2:12.37] [3:2.686] [name] (cm) : rea [1:70.372] [2:1.237] [3:2.149] Volume (cm) : [1:49.80%] [2:0.00%] [3:0.00%] % Reduction in [name] rea: [1:74.90%] [2:0.00%] [3:0.00%] % Reduction in Volume: [1:3] [3:12] Starting Position 1 (o'clock): [1:6] [3:12] Ending Position 1 (o'clock): [1:2] [3:0.9] Maximum Distance 1 (cm): [1:Yes] [2:No] [3:Yes] Undermining: [1:Full Thickness With Exposed Support] [2:Unclassifiable] [3:Full Thickness With Exposed Support] Classification: [1:Structures Large] [2:Medium] [3:Structures Medium] Exudate [name] mount: [1:Purulent] [2:Serosanguineous] [3:Serosanguineous] Exudate Type: [1:yellow, brown, green] [2:red, brown] [3:red, brown] Exudate Color: [1:Yes] [2:Yes] [3:Yes] Foul Odor [name] Cleansing: [1:fter No] [2:No] [3:No] Odor Anticipated Due to Product Use:  [1:Well defined, not attached] [2:Distinct, outline attached] [3:Distinct, outline attached] Wound Margin: [1:Large (67-100%)] [2:None Present (0%)] [3:None Present (0%)] Granulation [name] mount: [1:Red] [2:[name]] [3:[name]] Granulation Quality: [1:Small (1-33%)] [2:Large (67-100%)] [3:Large (67-100%)] Necrotic Amount: [1:Adherent Slough] [2:Eschar, Adherent Slough] [3:Eschar, Adherent Slough] Necrotic Tissue: [1:Fat Layer (Subcutaneous Tissue): Yes [name]] [3:Bone: Yes] Exposed Structures: [1:Muscle: Yes Bone: Yes Fascia: No Tendon: No Joint: No Small (1-33%)] [2:None] [3:None] Epithelialization: [1:[name]] [2:[name]] [3:Debridement - Selective/Open Wound] Debridement: Pre-procedure Verification/Time Out [name] [2:[name]] [3:12:12] Taken: [1:[name]] [2:[name]] [3:Necrotic/Eschar] Tissue Debrided: [1:[name]] [2:[name]] [3:Skin/Dermis] Level: [1:[name]] [2:[name]] [3:3.42] Debridement [name] (sq cm): [1:rea [name]] [2:[name]] [3:Forceps, Scissors] Instrument: [1:[name]] [2:[name]] [3:Minimum] Bleeding: [1:[name]] [2:[name]] [3:Pressure] Hemostasis [name] chieved: [1:[name]] [2:[name]] [3:Procedure was tolerated well] Debridement Treatment Response: [1:[name]] [2:[name]] [3:1.8x1.9x0.8] Post Debridement Measurements L x W x D (cm) [1:[name]] [2:[name]] [3:2.149] Post Debridement Volume: (cm) [1:[name]] [2:[name]] [3:Debridement] Treatment Notes Electronic Signature(s) Signed: 12/09/2021 5:45:31 PM By: Linton Ham MD Signed: 12/09/2021 6:00:15 PM By: Lorrin Jackson Entered By: Linton Ham on 12/09/2021 12:32:42 -------------------------------------------------------------------------------- Multi-Disciplinary Care Plan Details Patient Name: Date of Service: [name], NA [name] [name] [name]. 12/09/2021 11:00 [name] M Medical Record Number: [id number] Patient Account Number: [id number] Date of Birth/Sex: Treating RN: 04-30-1956 (66 y.o. [name] Primary Care Nesbit Michon: [name] Other Clinician: Referring Roshon Duell: Treating Aodhan Scheidt/Extender: [name] in Treatment: 7 Active Inactive Electronic Signature(s) Signed: 01/19/2022 8:02:48 AM By: Lorrin Jackson Signed: 02/06/2022 2:04:28 PM By: [name] Hammock RN Previous Signature: 12/11/2021 4:55:17 PM Version By: [name] Hammock RN Entered By: Lorrin Jackson on 01/19/2022 08:02:48 -------------------------------------------------------------------------------- Pain Assessment Details Patient Name: Date of Service: [name], NA [name] [name] [name]. 12/09/2021 11:00 [name] M Medical Record Number: [id number] Patient Account Number: [id number] Date of Birth/Sex: Treating RN: 1955/12/05 (66 y.o. [name] Primary Care Elysia Grand: [name] Other Clinician: Referring Annalyce Lanpher: Treating Myan Locatelli/Extender: [name] in Treatment: 7 Active Problems Location of Pain Severity and Description of Pain Patient Has Paino No Site Locations Pain Management and Medication Current Pain Management: Electronic Signature(s) Signed: 12/11/2021 4:55:17 PM By: [name] Hammock RN Entered By: [name] on 12/09/2021 11:46:46 -------------------------------------------------------------------------------- Patient/Caregiver Education Details Patient Name: Date of Service: [name], Vinetta Bergamo 6/6/2023andnbsp11:00 Star Record Number: [id number] Patient Account Number: [id number] Date of Birth/Gender: Treating RN: May 27, 1956 (66 y.o. [name] Primary Care Physician: [name] Other Clinician: Referring Physician: Treating Physician/Extender: [name] in Treatment: 7 Education Assessment Education Provided To: Patient Education Topics Provided Infection: Methods: Explain/Verbal, Printed Responses: State content correctly Wound/Skin Impairment: Methods: Demonstration, Explain/Verbal, Printed Responses: State content correctly Electronic Signature(s) Signed: 12/11/2021 4:55:17 PM By: [name] Hammock RN Entered By: [name] on 12/09/2021 12:14:13 --------------------------------------------------------------------------------  Wound Assessment Details Patient Name: Date of Service: [name], [place] [name] [name] [name]. 12/09/2021 11:00 [name] M Medical Record Number: [id number] Patient Account Number: [id number] Date of Birth/Sex: Treating RN: Aug 05, 1955 (66 y.o. [name], [name] Primary Care Jalesa Thien: [name] Other Clinician: Referring Dmitriy Gair: Treating Guiliana Shor/Extender: [name] in Treatment: 7 Wound Status Wound Number: 1 Primary Dehisced Wound Etiology: Wound Location: Right Amputation Site - Transmetatarsal Wound Open Wounding Event: Surgical Injury Status: Date Acquired: 09/19/2021 Comorbid Asthma, Sleep Apnea, Hypertension, Peripheral Venous Disease, Weeks Of Treatment: 7 History: Type II Diabetes, Osteomyelitis, Neuropathy Clustered Wound: No Photos Wound Measurements Length: (cm) 10 Width: (cm) 6.4 Depth: (cm) 1.4 Area: (cm) 50.265 Volume: (cm) 70.372 % Reduction in Area: 49.8% % Reduction in Volume: 74.9% Epithelialization: Small (1-33%) Tunneling: No Undermining: Yes Starting Position (o'clock): 3 Ending Position (o'clock): 6 Maximum Distance: (cm) 2 Wound Description Classification: Full Thickness With Exposed Support Structures Wound Margin: Well defined, not attached Exudate Amount: Large Exudate Type: Purulent Exudate Color: yellow, brown, green Foul Odor After Cleansing: Yes Due to Product Use: No Slough/Fibrino Yes Wound Bed Granulation Amount: Large (67-100%) Exposed Structure Granulation Quality: Red Fascia Exposed: No Necrotic Amount: Small (1-33%) Fat Layer (Subcutaneous Tissue) Exposed: Yes Necrotic Quality: Adherent Slough Tendon Exposed: No Muscle Exposed: Yes Necrosis of Muscle: No Joint Exposed: No Bone Exposed: Yes Electronic Signature(s) Signed: 12/09/2021 6:06:53 PM By: [name]  RN, [name] Signed: 12/11/2021 4:55:17 PM By: [name] Hammock RN Entered By: [name] on 12/09/2021 11:56:29 -------------------------------------------------------------------------------- Wound Assessment Details Patient Name: Date of Service: [name], NA [name] [name] [name]. 12/09/2021 11:00 [name] M Medical Record Number: [id number] Patient Account Number: [id number] Date of Birth/Sex: Treating RN: April 16, 1956 (66 y.o. [name], [name] Primary Care Randolf Sansoucie: [name] Other Clinician: Referring Jashua Knaak: Treating Genna Casimir/Extender: [name] in Treatment: 7 Wound Status Wound Number: 2 Primary Arterial Insufficiency Ulcer Etiology: Wound Location: Left T Great oe Wound Open Wounding Event: Gradually Appeared Status: Date Acquired: 11/14/2021 Comorbid Asthma, Sleep Apnea, Hypertension, Peripheral Venous Disease, Weeks Of Treatment: 0 History: Type II Diabetes, Osteomyelitis, Neuropathy Clustered Wound: No Photos Wound Measurements Length: (cm) 4.5 Width: (cm) 3.5 Depth: (cm) 0.1 Area: (cm) 12.37 Volume: (cm) 1.237 % Reduction in Area: 0% % Reduction in Volume: 0% Epithelialization: None Tunneling: No Undermining: No Wound Description Classification: Unclassifiable Wound Margin: Distinct, outline attached Exudate Amount: Medium Exudate Type: Serosanguineous Exudate Color: red, brown Wound Bed Granulation Amount: None Present (0%) Necrotic Amount: Large (67-100%) Necrotic Quality: Eschar, Adherent Slough Foul Odor After Cleansing: Yes Due to Product Use: No Slough/Fibrino Yes Electronic Signature(s) Signed: 12/09/2021 6:06:53 PM By: [name] RN, [name] Signed: 12/11/2021 4:55:17 PM By: [name] Hammock RN Entered By: [name] on 12/09/2021 12:02:38 -------------------------------------------------------------------------------- Wound Assessment Details Patient Name: Date of Service: [name], NA [name] [name] [name]. 12/09/2021 11:00 [name]  M Medical Record Number: [id number] Patient Account Number: [id number] Date of Birth/Sex: Treating RN: [birth year] (66 y.o. [name], [name] Primary Care Jaeleah Smyser: [name] Other Clinician: Referring Tomaz Janis: Treating Quanika Solem/Extender: [name] Weeks in Treatment: 7 Wound Status Wound Number: 3 Primary Arterial Insufficiency Ulcer Etiology: Wound Location: Left, Lateral Foot Wound Open Wounding Event: Gradually Appeared Status: Date Acquired: 11/14/2021 Comorbid Asthma, Sleep Apnea, Hypertension, Peripheral Venous Disease, Weeks Of Treatment: 0 History: Type II Diabetes, Osteomyelitis, Neuropathy Clustered Wound: No Photos Wound Measurements Length: (cm) 1.8 Width: (cm) 1.9 Depth: (cm) 0.8 Area: (cm) 2.686 Volume: (cm) 2.149 % Reduction in Area: 0% % Reduction in  Volume: 0% Epithelialization: None Tunneling: No Undermining: Yes Starting Position (o'clock): 12 Ending Position (o'clock): 12 Maximum Distance: (cm) 0.9 Wound Description Classification: Full Thickness With Exposed Support Structures Wound Margin: Distinct, outline attached Exudate Amount: Medium Exudate Type: Serosanguineous Exudate Color: red, brown Foul Odor After Cleansing: Yes Due to Product Use: No Slough/Fibrino Yes Wound Bed Granulation Amount: None Present (0%) Exposed Structure Necrotic Amount: Large (67-100%) Bone Exposed: Yes Necrotic Quality: Eschar, Adherent Therapist, music) Signed: 12/09/2021 6:06:53 PM By: [name] RN, [name] Signed: 12/11/2021 4:55:17 PM By: [name] Hammock RN Entered By: [name] on 12/09/2021 12:04:00 -------------------------------------------------------------------------------- Vitals Details Patient Name: Date of Service: [name], NA [name] [name] [name]. 12/09/2021 11:00 [name] M Medical Record Number: [id number] Patient Account Number: [id number] Date of Birth/Sex: Treating RN: [birth year] (66 y.o. [name],  [name] Primary Care Gleb Mcguire: [name] Other Clinician: Referring Kupono Marling: Treating June Vacha/Extender: [name] in Treatment: 7 Vital Signs Time Taken: 11:45 Temperature (F): 98.1 Height (in): 72 Pulse (bpm): 71 Weight (lbs): 216 Respiratory Rate (breaths/min): 17 Body Mass Index (BMI): 29.3 Blood Pressure (mmHg): 123/72 Capillary Blood Glucose (mg/dl): 187 Reference Range: 80 - 120 mg / dl Electronic Signature(s) Signed: 12/11/2021 4:55:17 PM By: [name] Hammock RN Entered By: [name] on 12/09/2021 11:46:39

## 2021-12-11 NOTE — Progress Notes (Signed)
JALIN, ALICEA ([id number]) Visit Report for 12/09/2021 Debridement Details Patient Name: Date of Service: [place], [name] 12/09/2021 11:00 A M Medical Record Number: [id number] Patient Account Number: [id number] Date of Birth/Sex: Treating RN: 09/14/1955 (66 y.o. [name] Primary Care Provider: York Ram Other Clinician: Referring Provider: Treating Provider/Extender: Lily Peer in Treatment: 7 Debridement Performed for Assessment: Wound #3 Left,Lateral Foot Performed By: Physician Ricard Dillon., MD Debridement Type: Debridement Severity of Tissue Pre Debridement: Fat layer exposed Level of Consciousness (Pre-procedure): Awake and Alert Pre-procedure Verification/Time Out Yes - 12:12 Taken: Start Time: 12:13 T Area Debrided (L x W): otal 1.8 (cm) x 1.9 (cm) = 3.42 (cm) Tissue and other material debrided: Eschar, Subcutaneous, Skin: Dermis Level: Skin/Subcutaneous Tissue Debridement Description: Excisional Instrument: Forceps, Scissors Bleeding: Minimum Hemostasis Achieved: Pressure End Time: 12:17 Response to Treatment: Procedure was tolerated well Level of Consciousness (Post- Awake and Alert procedure): Post Debridement Measurements of Total Wound Length: (cm) 1.8 Width: (cm) 1.9 Depth: (cm) 0.8 Volume: (cm) 2.149 Character of Wound/Ulcer Post Debridement: Stable Severity of Tissue Post Debridement: Fat layer exposed Post Procedure Diagnosis Same as Pre-procedure Electronic Signature(s) Signed: 12/09/2021 5:45:31 PM By: Linton Ham MD Signed: 12/09/2021 6:00:15 PM By: Lorrin Jackson Entered By: Linton Ham on 12/09/2021 12:36:25 -------------------------------------------------------------------------------- HPI Details Patient Name: Date of Service: [name], [name] 12/09/2021 11:00 A M Medical Record Number: [id number] Patient Account Number: [id number] Date of Birth/Sex: Treating RN: 1956-06-20 (66  y.o. [name] Primary Care Provider: York Ram Other Clinician: Referring Provider: Treating Provider/Extender: Lily Peer in Treatment: 7 History of Present Illness HPI Description: ADMISSION 10/16/20 This is a 66 year old man who is a type II diabetic. Initially seen by Dr. Unk Lightning of vein and vascular on 09/10/2021 for bilateral lower extremity rest pain right greater than left. His ABIs demonstrated monophasic waveforms at the ankles bilaterally with depressed toe pressures. His ABI in the right was 0.5 and on the left 0.28. There were no obtainable waveforms for TBI's. He underwent angiography on 09/10/2021 and had findings of severe PAD with 95% stenosis of the distal SFA. He also had peroneal and posterior tibial arteries occluded with significant collateralization in the calf there was reconstitution of the posterior tibial artery in the distal third of the calf. [place] of the anterior tibial artery was not appreciated. He underwent a angioplasty of the superficial femoral artery. He required admission to hospital from 09/12/2021 through 09/22/2021 with right foot cellulitis and sepsis. On 3/13 he underwent a right below-knee popliteal to posterior tibial bypass using a greater saphenous vein. 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 He was discharged to [facility] skilled facility. He arrives in clinic today with a large wound area over the entirety of his amputation site extending proximally. The attempted flap closure of the amputation site and his TMA has failed and the wound extends under the attempt to closure. Still some sutures in place. There is an odor but he is not systemically unwell. He is due for follow-up arterial studies and has an appointment with vascular surgery on 10/28/2021. They are using wet-to-dry dressings at [facility]. Past medical history  includes type 2 diabetes with peripheral neuropathy, hypertension, obstructive sleep apnea 4/20; patient presents for follow-up. He is being discharged from his facility at [facility] today. They have been doing Dakin's wet-to-dry dressings. He states that his fiance  can help with dressing changes. He is getting [facility] home [facility] to come out 3 times a week once he leaves the facility. He is scheduled to see vein and vascular on 5/12. He has not seen the wound himself. He puts covers over his face for wound exams. He currently denies systemic signs of infection. 4/28; patient admitted to the clinic 2 weeks ago with a dehisced right TMA in the setting of type 2 diabetes with significant PAD. He is status post revascularization. He has been discharged from the nursing home he was at and now is at home using Dakin's wet-to-dry dressings daily he has home health. He denies any systemic signs of infection 5/4; patient presents for follow-up. His fiance and home health have been changing his dressings. He currently denies systemic signs of infection. He continues to put sheet covers over his face during our wound encounters because he does not want a look at the wound. 5/25; Patient presents for follow-up. He missed his last clinic appointment. He had a bone biopsy and culture done at last clinic visit that showed acute osteomyelitis with growth of E. coli, stenotrophomonas maltophilia and enteroccocus faecalis. A referral to infectious disease was made. He has not heard from them yet. He has been using Dakin's wet-to-dry dressings. He is now more comfortable with looking at the wound bed. 6/6; the patient comes into clinic today with 2 wounds on the left foot which apparently have been there for several months. This was not known to assess but was known by Dr. Donzetta Matters. In fact he underwent an angiogram yesterday. He had a laser atherectomy of the left posterior tibial artery with a 3 mm balloon angioplasty,  also a stent of the last left SFA. 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 This is a lot better than the last time I saw it. He has been using Dakin's wet-to-dry. He has an appointment with infectious disease for a bone biopsy which showed E. coli, stenotrophomonas and Enterococcus faecalis. The appointment is for tomorrow Electronic Signature(s) Signed: 12/09/2021 5:45:31 PM By: Linton Ham MD Entered By: Linton Ham on 12/09/2021 12:39:05 -------------------------------------------------------------------------------- Physical Exam Details Patient Name: Date of Service: [name], [name] 12/09/2021 11:00 A M Medical Record Number: [id number] Patient Account Number: [id number] Date of Birth/Sex: Treating RN: 1956-04-26 (66 y.o. [name] Primary Care Provider: York Ram Other Clinician: Referring Provider: Treating Provider/Extender: Lily Peer in Treatment: 7 Constitutional Sitting or standing Blood Pressure is within target range for patient.. Pulse regular and within target range for patient.Marland Kitchen Respirations regular, non-labored and within target range.. Temperature is normal and within the target range for the patient.Marland Kitchen Appears in no distress. Cardiovascular Both the dorsalis pedis and posterior tibial pulses on the right foot are palpable.. Notes Wound exam; 1. Right foot transmetatarsal amputation site with large dehiscence. Inferior flap. He has an area of exposed bone. Nevertheless the base of the wound actually looks quite a bit better than I remember. 2. New this week dry gangrene of the first toe which is Muffifying. On the left lateral first metatarsal head completely necrotic wound. I used pickups and scissors to remove eschar and subcutaneous tissue. This does not  probe to bone but does not look completely healthy either. Electronic Signature(s) Signed: 12/09/2021 5:45:31 PM  By: Linton Ham MD Entered By: Linton Ham on 12/09/2021 12:42:24 -------------------------------------------------------------------------------- Physician Orders Details Patient Name: Date of Service: [name], [name] 12/09/2021 11:00 A M Medical Record Number: [id number] Patient Account Number: [id number] Date of Birth/Sex: Treating RN: [birth year] (66 y.o. [name], [name] Primary Care Provider: York Ram Other Clinician: Referring Provider: Treating Provider/Extender: Lily Peer in Treatment: 7 Verbal / Phone Orders: No Diagnosis Coding ICD-10 Coding Code Description E11.621 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 severity E11.52 Type 2 diabetes mellitus with diabetic peripheral angiopathy with gangrene E11.42 Type 2 diabetes mellitus with diabetic polyneuropathy Follow-up Appointments ppointment in 1 week. - 12/16/21 @ 10:15 am with Dr. Heber [place] Leveda Anna, Room 7) Return A Other: - ID Appt 12/10/21 Bathing/ Shower/ Hygiene May shower and wash wound with soap and water. - May wash with antibacterial soap with dressing changes Off-Loading Open toe surgical shoe to: - to right foot: Reduce pressure to area as much as possible. Additional Orders / Instructions Follow Nutritious Diet - -High Protein Diet -Monitor/Control Blood Sugar Home Health New wound care orders this week; continue Home Health for wound care. May utilize formulary equivalent dressing for wound treatment orders unless otherwise specified. Dressing changes to be completed by [place] on Monday / Wednesday / Friday except when patient has scheduled visit at [facility]. Other Home Health Orders/Instructions: [facility] Wound Treatment Wound #1 - Amputation Site -  Transmetatarsal Wound Laterality: Right Cleanser: Soap and Water ([place]) 1 x Per Day/30 Days Discharge Instructions: May shower and wash wound with dial antibacterial soap and water prior to dressing change. Cleanser: Wound Cleanser 1 x Per Day/30 Days Discharge Instructions: Cleanse the wound with wound cleanser prior to applying a clean dressing using gauze sponges, not tissue or cotton balls. Prim Dressing: Dakin's Solution 0.25%, 16 (oz) (Home Health) (Generic) 1 x Per Day/30 Days ary Discharge Instructions: Moisten gauze with Dakin's solution Secondary Dressing: ABD Pad, 8x10 (Home Health) 1 x Per Day/30 Days Discharge Instructions: Apply over primary dressing as directed. Secondary Dressing: Woven Gauze Sponge, Non-Sterile 4x4 in (Home Health) 1 x Per Day/30 Days Discharge Instructions: Moisten with Dakins and pack lightly into wound Secured With: Elastic Bandage 4 inch (ACE bandage) (Home Health) 1 x Per Day/30 Days Discharge Instructions: Secure with ACE bandage as directed. Secured With: [employer], 4.5x3.1 (in/yd) (Home Health) 1 x Per Day/30 Days Discharge Instructions: Secure with Kerlix as directed. Wound #2 - T Great oe Wound Laterality: Left Cleanser: Soap and Water (Home Health) 1 x Per Day/30 Days Discharge Instructions: May shower and wash wound with dial antibacterial soap and water prior to dressing change. Topical: Betadine (Home Health) 1 x Per Day/30 Days Wound #3 - Foot Wound Laterality: Left, Lateral Cleanser: Soap and Water (Home Health) 1 x Per Day/30 Days Discharge Instructions: May shower and wash wound with dial antibacterial soap and water prior to dressing change. Cleanser: Wound Cleanser (Home Health) 1 x Per Day/30 Days Discharge Instructions: Cleanse the wound with wound cleanser prior to applying a clean dressing using gauze sponges, not tissue or cotton balls. Prim Dressing: KerraCel Ag Gelling Fiber Dressing, 2x2 in (silver alginate)  (Home Health) 1 x Per Day/30 Days ary Discharge Instructions: Apply silver alginate to wound bed as instructed Secondary Dressing: Woven Gauze Sponges 2x2 in ([place]) 1 x Per Day/30 Days Discharge Instructions: Apply over primary  dressing as directed. Secured With: Child psychotherapist, Sterile 2x75 (in/in) (Home Health) 1 x Per Day/30 Days Discharge Instructions: Secure with stretch gauze as directed. Secured With: 43M Medipore Public affairs consultant Surgical T 2x10 (in/yd) ([place]) 1 x Per Day/30 Days ape Discharge Instructions: Secure with tape as directed. Electronic Signature(s) Signed: 12/09/2021 5:45:31 PM By: Linton Ham MD Signed: 12/11/2021 4:55:17 PM By: Rhae Hammock RN Entered By: Rhae Hammock on 12/09/2021 12:22:45 -------------------------------------------------------------------------------- Problem List Details Patient Name: Date of Service: [name], [name] 12/09/2021 11:00 A M Medical Record Number: [id number] Patient Account Number: [id number] Date of Birth/Sex: Treating RN: 1956-01-06 (66 y.o. [name] Primary Care Provider: York Ram Other Clinician: Referring Provider: Treating Provider/Extender: Lily Peer in Treatment: 7 Active Problems ICD-10 Encounter Code Description Active Date MDM Diagnosis L97.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 specified 12/09/2021 No Yes severity E11.621 Type 2 diabetes mellitus with foot ulcer 10/16/2021 No Yes 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 10/16/2021 No Yes severity E11.52 Type 2 diabetes mellitus with diabetic peripheral angiopathy with gangrene 10/16/2021 No Yes E11.42 Type 2 diabetes mellitus with diabetic polyneuropathy 10/16/2021 No Yes Inactive Problems Resolved Problems Electronic Signature(s) Signed: 12/09/2021 5:45:31 PM By: Linton Ham MD Entered By: Linton Ham  on 12/09/2021 12:28:37 -------------------------------------------------------------------------------- Progress Note Details Patient Name: Date of Service: [name], [name] 12/09/2021 11:00 A M Medical Record Number: [id number] Patient Account Number: [id number] Date of Birth/Sex: Treating RN: May 19, 1956 (66 y.o. [name] Primary Care Provider: York Ram Other Clinician: Referring Provider: Treating Provider/Extender: Lily Peer in Treatment: 7 Subjective History of Present Illness (HPI) ADMISSION 10/16/20 This is a 66 year old man who is a type II diabetic. Initially seen by Dr. Unk Lightning of vein and vascular on 09/10/2021 for bilateral lower extremity rest pain right greater than left. His ABIs demonstrated monophasic waveforms at the ankles bilaterally with depressed toe pressures. His ABI in the right was 0.5 and on the left 0.28. There were no obtainable waveforms for TBI's. He underwent angiography on 09/10/2021 and had findings of severe PAD with 95% stenosis of the distal SFA. He also had peroneal and posterior tibial arteries occluded with significant collateralization in the calf there was reconstitution of the posterior tibial artery in the distal third of the calf. [place] of the anterior tibial artery was not appreciated. He underwent a angioplasty of the superficial femoral artery. He required admission to hospital from 09/12/2021 through 09/22/2021 with right foot cellulitis and sepsis. On 3/13 he underwent a right below-knee popliteal to posterior tibial bypass using a greater saphenous vein. 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 He was discharged to [facility] skilled facility. He arrives in clinic today with a large wound area over the entirety of his amputation site extending proximally. The attempted flap closure of the amputation site and his TMA  has failed and the wound extends under the attempt to closure. Still some sutures in place. There is an odor but he is not systemically unwell. He is due for follow-up arterial studies and has an appointment with vascular surgery on 10/28/2021. They are using wet-to-dry dressings at [facility]. Past medical history includes type 2 diabetes with peripheral neuropathy, hypertension, obstructive sleep apnea 4/20; patient presents for follow-up. He is being discharged from his facility at [facility] today.  They have been doing Dakin's wet-to-dry dressings. He states that his fiance can help with dressing changes. He is getting [facility] home [facility] to come out 3 times a week once he leaves the facility. He is scheduled to see vein and vascular on 5/12. He has not seen the wound himself. He puts covers over his face for wound exams. He currently denies systemic signs of infection. 4/28; patient admitted to the clinic 2 weeks ago with a dehisced right TMA in the setting of type 2 diabetes with significant PAD. He is status post revascularization. He has been discharged from the nursing home he was at and now is at home using Dakin's wet-to-dry dressings daily he has home health. He denies any systemic signs of infection 5/4; patient presents for follow-up. His fiance and home health have been changing his dressings. He currently denies systemic signs of infection. He continues to put sheet covers over his face during our wound encounters because he does not want a look at the wound. 5/25; Patient presents for follow-up. He missed his last clinic appointment. He had a bone biopsy and culture done at last clinic visit that showed acute osteomyelitis with growth of E. coli, stenotrophomonas maltophilia and enteroccocus faecalis. A referral to infectious disease was made. He has not heard from them yet. He has been using Dakin's wet-to-dry dressings. He is now more comfortable with looking at the wound bed. 6/6;  the patient comes into clinic today with 2 wounds on the left foot which apparently have been there for several months. This was not known to assess but was known by Dr. Donzetta Matters. In fact he underwent an angiogram yesterday. He had a laser atherectomy of the left posterior tibial artery with a 3 mm balloon angioplasty, also a stent of the last left SFA. 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 This is a lot better than the last time I saw it. He has been using Dakin's wet-to-dry. He has an appointment with infectious disease for a bone biopsy which showed E. coli, stenotrophomonas and Enterococcus faecalis. The appointment is for tomorrow Objective Constitutional Sitting or standing Blood Pressure is within target range for patient.. Pulse regular and within target range for patient.Marland Kitchen Respirations regular, non-labored and within target range.. Temperature is normal and within the target range for the patient.Marland Kitchen Appears in no distress. Vitals Time Taken: 11:45 AM, Height: 72 in, Weight: 216 lbs, BMI: 29.3, Temperature: 98.1 F, Pulse: 71 bpm, Respiratory Rate: 17 breaths/min, Blood Pressure: 123/72 mmHg, Capillary Blood Glucose: 187 mg/dl. Cardiovascular Both the dorsalis pedis and posterior tibial pulses on the right foot are palpable.. General Notes: Wound exam; 1. Right foot transmetatarsal amputation site with large dehiscence. Inferior flap. He has an area of exposed bone. Nevertheless the base of the wound actually looks quite a bit better than I remember. 2. New this week dry gangrene of the first toe which is Muffifying. On the left lateral first metatarsal head completely necrotic wound. I used pickups and scissors to remove eschar and subcutaneous tissue. This does not probe to bone but does not look completely healthy  either. Integumentary (Hair, Skin) Wound #1 status is Open. Original cause of wound was Surgical Injury. The date acquired was: 09/19/2021. The wound has been in treatment 7 weeks. The wound is located on the  Right Amputation Site - Transmetatarsal. The wound measures 10cm length x 6.4cm width x 1.4cm depth; 50.265cm^2 area and 70.372cm^3 volume. There is bone, muscle, and Fat Layer (Subcutaneous Tissue) exposed. There is no tunneling noted, however, there is undermining starting at 3:00 and ending at 6:00 with a maximum distance of 2cm. There is a large amount of purulent drainage noted. Foul odor after cleansing was noted. The wound margin is well defined and not attached to the wound base. There is large (67-100%) red granulation within the wound bed. There is a small (1-33%) amount of necrotic tissue within the wound bed including Adherent Slough. Wound #2 status is Open. Original cause of wound was Gradually Appeared. The date acquired was: 11/14/2021. The wound is located on the Left T Great. The oe wound measures 4.5cm length x 3.5cm width x 0.1cm depth; 12.37cm^2 area and 1.237cm^3 volume. There is no tunneling or undermining noted. There is a medium amount of serosanguineous drainage noted. Foul odor after cleansing was noted. The wound margin is distinct with the outline attached to the wound base. There is no granulation within the wound bed. There is a large (67-100%) amount of necrotic tissue within the wound bed including Eschar and Adherent Slough. Wound #3 status is Open. Original cause of wound was Gradually Appeared. The date acquired was: 11/14/2021. The wound is located on the Left,Lateral Foot. The wound measures 1.8cm length x 1.9cm width x 0.8cm depth; 2.686cm^2 area and 2.149cm^3 volume. There is bone exposed. There is no tunneling noted, however, there is undermining starting at 12:00 and ending at 12:00 with a maximum distance of 0.9cm. There is a medium amount of  serosanguineous drainage noted. Foul odor after cleansing was noted. The wound margin is distinct with the outline attached to the wound base. There is no granulation within the wound bed. There is a large (67-100%) amount of necrotic tissue within the wound bed including Eschar and Adherent Slough. Assessment Active Problems ICD-10 Non-pressure chronic ulcer of other part of left foot with other specified severity Type 2 diabetes mellitus with foot ulcer 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 severity Type 2 diabetes mellitus with diabetic peripheral angiopathy with gangrene Type 2 diabetes mellitus with diabetic polyneuropathy Procedures Wound #3 Pre-procedure diagnosis of Wound #3 is an Arterial Insufficiency Ulcer located on the Left,Lateral Foot .Severity of Tissue Pre Debridement is: Fat layer exposed. There was a Excisional Skin/Subcutaneous Tissue Debridement with a total area of 3.42 sq cm performed by Ricard Dillon., MD. With the following instrument(s): Forceps, and Scissors Material removed includes Eschar, Subcutaneous Tissue, and Skin: Dermis. No specimens were taken. A time out was conducted at 12:12, prior to the start of the procedure. A Minimum amount of bleeding was controlled with Pressure. The procedure was tolerated well. Post Debridement Measurements: 1.8cm length x 1.9cm width x 0.8cm depth; 2.149cm^3 volume. Character of Wound/Ulcer Post Debridement is stable. Severity of Tissue Post Debridement is: Fat layer exposed. Post procedure Diagnosis Wound #3: Same as Pre-Procedure Plan Follow-up Appointments: Return Appointment in 1 week. - 12/16/21 @ 10:15 am with Dr. Heber [place] Leveda Anna, Room 7) Other: - ID Appt 12/10/21 Bathing/ Shower/ Hygiene: May shower and wash wound with soap and water. - May wash with antibacterial soap with dressing changes Off-Loading: Open toe surgical shoe to: - to right foot: Reduce pressure to area as much as  possible. Additional Orders / Instructions: Follow Nutritious Diet - -High Protein Diet -Monitor/Control Blood Sugar Home Health:  New wound care orders this week; continue Home Health for wound care. May utilize formulary equivalent dressing for wound treatment orders unless otherwise specified. Dressing changes to be completed by [place] on Monday / Wednesday / Friday except when patient has scheduled visit at [facility]. Other Home Health Orders/Instructions: [facility] WOUND #1: - Amputation Site - Transmetatarsal Wound Laterality: Right Cleanser: Soap and Water (Home Health) 1 x Per Day/30 Days Discharge Instructions: May shower and wash wound with dial antibacterial soap and water prior to dressing change. Cleanser: Wound Cleanser 1 x Per Day/30 Days Discharge Instructions: Cleanse the wound with wound cleanser prior to applying a clean dressing using gauze sponges, not tissue or cotton balls. Prim Dressing: Dakin's Solution 0.25%, 16 (oz) (Home Health) (Generic) 1 x Per Day/30 Days ary Discharge Instructions: Moisten gauze with Dakin's solution Secondary Dressing: ABD Pad, 8x10 (Home Health) 1 x Per Day/30 Days Discharge Instructions: Apply over primary dressing as directed. Secondary Dressing: Woven Gauze Sponge, Non-Sterile 4x4 in (Home Health) 1 x Per Day/30 Days Discharge Instructions: Moisten with Dakins and pack lightly into wound Secured With: Elastic Bandage 4 inch (ACE bandage) (Home Health) 1 x Per Day/30 Days Discharge Instructions: Secure with ACE bandage as directed. Secured With: [employer], 4.5x3.1 (in/yd) (Home Health) 1 x Per Day/30 Days Discharge Instructions: Secure with Kerlix as directed. WOUND #2: - T Great Wound Laterality: Left oe Cleanser: Soap and Water (Home Health) 1 x Per Day/30 Days Discharge Instructions: May shower and wash wound with dial antibacterial soap and water prior to dressing change. Topical: Betadine (Home Health) 1 x  Per Day/30 Days WOUND #3: - Foot Wound Laterality: Left, Lateral Cleanser: Soap and Water (Home Health) 1 x Per Day/30 Days Discharge Instructions: May shower and wash wound with dial antibacterial soap and water prior to dressing change. Cleanser: Wound Cleanser (Home Health) 1 x Per Day/30 Days Discharge Instructions: Cleanse the wound with wound cleanser prior to applying a clean dressing using gauze sponges, not tissue or cotton balls. Prim Dressing: KerraCel Ag Gelling Fiber Dressing, 2x2 in (silver alginate) (Home Health) 1 x Per Day/30 Days ary Discharge Instructions: Apply silver alginate to wound bed as instructed Secondary Dressing: Woven Gauze Sponges 2x2 in ([place]) 1 x Per Day/30 Days Discharge Instructions: Apply over primary dressing as directed. Secured With: Child psychotherapist, Sterile 2x75 (in/in) (Home Health) 1 x Per Day/30 Days Discharge Instructions: Secure with stretch gauze as directed. Secured With: 85M Medipore Public affairs consultant Surgical T 2x10 (in/yd) ([place]) 1 x Per Day/30 Days ape Discharge Instructions: Secure with tape as directed. 1. Continue with the Dakin's wet-to-dry to the right foot which is being changed by his girlfriend. He has an appointment with infectious disease tomorrow we will see what they want to do in terms of antibiotics and/or imaging. Ultimately he may have enough of a revascularization to consider more advanced options 2. New this week the left first toe which is not going to be salvageable. Apparently Dr. Donzetta Matters has a plan to amputate this in 2 weeks. Also very concerning is the left lateral foot. I looked through epic I did not see any imaging of this area this may be necessary next week. We use silver alginate in this.. We use Betadine on the first toe Electronic Signature(s) Signed: 12/09/2021 5:45:31 PM By: Linton Ham MD Entered By: Linton Ham on 12/09/2021  12:44:49 -------------------------------------------------------------------------------- SuperBill Details Patient Name: Date of Service: [name], [place]  A. 12/09/2021 Medical Record Number: [id number] Patient Account Number: [id number] Date of Birth/Sex: Treating RN: 1956-02-05 (66 y.o. [name] Primary Care Provider: York Ram Other Clinician: Referring Provider: Treating Provider/Extender: Lily Peer in Treatment: 7 Diagnosis Coding ICD-10 Codes Code Description [phone] Non-pressure chronic ulcer of other part of left foot with other specified severity E11.621 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 severity E11.52 Type 2 diabetes mellitus with diabetic peripheral angiopathy with gangrene E11.42 Type 2 diabetes mellitus with diabetic polyneuropathy Facility Procedures CPT4 Code: [id number] Description: 85462 - DEB SUBQ TISSUE 20 SQ CM/< ICD-10 Diagnosis Description L97.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 specified seve Modifier: rity Quantity: 1 Physician Procedures : CPT4 Code Description Modifier [id number] - WC PHYS SUBQ TISS 20 SQ CM ICD-10 Diagnosis Description L97.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 specified severity Quantity: 1 Electronic Signature(s) Signed: 12/09/2021 5:45:31 PM By: Linton Ham MD Entered By: Linton Ham on 12/09/2021 12:45:04

## 2021-12-12 ENCOUNTER — Other Ambulatory Visit: Payer: Self-pay | Admitting: Vascular Surgery

## 2021-12-12 DIAGNOSIS — I70223 Atherosclerosis of native arteries of extremities with rest pain, bilateral legs: Secondary | ICD-10-CM

## 2021-12-14 ENCOUNTER — Inpatient Hospital Stay (HOSPITAL_COMMUNITY)
Admission: EM | Admit: 2021-12-14 | Discharge: 2021-12-24 | DRG: 854 | Disposition: A | Discharge status: Skilled Nursing Facility | Payer: Medicare HMO | Attending: Internal Medicine | Admitting: Internal Medicine

## 2021-12-14 ENCOUNTER — Encounter (HOSPITAL_COMMUNITY): Payer: Self-pay

## 2021-12-14 ENCOUNTER — Other Ambulatory Visit: Payer: Self-pay

## 2021-12-14 ENCOUNTER — Inpatient Hospital Stay (HOSPITAL_COMMUNITY): Payer: Medicare HMO

## 2021-12-14 ENCOUNTER — Emergency Department (HOSPITAL_COMMUNITY): Payer: Medicare HMO

## 2021-12-14 DIAGNOSIS — E785 Hyperlipidemia, unspecified: Secondary | ICD-10-CM | POA: Diagnosis present

## 2021-12-14 DIAGNOSIS — I70262 Atherosclerosis of native arteries of extremities with gangrene, left leg: Secondary | ICD-10-CM | POA: Diagnosis present

## 2021-12-14 DIAGNOSIS — F4310 Post-traumatic stress disorder, unspecified: Secondary | ICD-10-CM | POA: Diagnosis present

## 2021-12-14 DIAGNOSIS — E871 Hypo-osmolality and hyponatremia: Secondary | ICD-10-CM | POA: Diagnosis not present

## 2021-12-14 DIAGNOSIS — M86142 Other acute osteomyelitis, left hand: Secondary | ICD-10-CM | POA: Diagnosis not present

## 2021-12-14 DIAGNOSIS — D72825 Bandemia: Secondary | ICD-10-CM

## 2021-12-14 DIAGNOSIS — E1152 Type 2 diabetes mellitus with diabetic peripheral angiopathy with gangrene: Secondary | ICD-10-CM | POA: Diagnosis present

## 2021-12-14 DIAGNOSIS — N179 Acute kidney failure, unspecified: Secondary | ICD-10-CM | POA: Diagnosis present

## 2021-12-14 DIAGNOSIS — Z87891 Personal history of nicotine dependence: Secondary | ICD-10-CM

## 2021-12-14 DIAGNOSIS — E1142 Type 2 diabetes mellitus with diabetic polyneuropathy: Secondary | ICD-10-CM | POA: Diagnosis present

## 2021-12-14 DIAGNOSIS — K219 Gastro-esophageal reflux disease without esophagitis: Secondary | ICD-10-CM | POA: Diagnosis present

## 2021-12-14 DIAGNOSIS — I70202 Unspecified atherosclerosis of native arteries of extremities, left leg: Secondary | ICD-10-CM | POA: Diagnosis not present

## 2021-12-14 DIAGNOSIS — J449 Chronic obstructive pulmonary disease, unspecified: Secondary | ICD-10-CM | POA: Diagnosis present

## 2021-12-14 DIAGNOSIS — I739 Peripheral vascular disease, unspecified: Secondary | ICD-10-CM | POA: Diagnosis not present

## 2021-12-14 DIAGNOSIS — M609 Myositis, unspecified: Secondary | ICD-10-CM | POA: Diagnosis present

## 2021-12-14 DIAGNOSIS — I70222 Atherosclerosis of native arteries of extremities with rest pain, left leg: Secondary | ICD-10-CM | POA: Diagnosis not present

## 2021-12-14 DIAGNOSIS — I998 Other disorder of circulatory system: Secondary | ICD-10-CM | POA: Diagnosis not present

## 2021-12-14 DIAGNOSIS — E1165 Type 2 diabetes mellitus with hyperglycemia: Secondary | ICD-10-CM | POA: Diagnosis not present

## 2021-12-14 DIAGNOSIS — Z95828 Presence of other vascular implants and grafts: Secondary | ICD-10-CM | POA: Diagnosis not present

## 2021-12-14 DIAGNOSIS — Z888 Allergy status to other drugs, medicaments and biological substances status: Secondary | ICD-10-CM

## 2021-12-14 DIAGNOSIS — L0390 Cellulitis, unspecified: Secondary | ICD-10-CM | POA: Diagnosis not present

## 2021-12-14 DIAGNOSIS — R6520 Severe sepsis without septic shock: Secondary | ICD-10-CM | POA: Diagnosis present

## 2021-12-14 DIAGNOSIS — M869 Osteomyelitis, unspecified: Secondary | ICD-10-CM | POA: Diagnosis not present

## 2021-12-14 DIAGNOSIS — E876 Hypokalemia: Secondary | ICD-10-CM | POA: Diagnosis not present

## 2021-12-14 DIAGNOSIS — D638 Anemia in other chronic diseases classified elsewhere: Secondary | ICD-10-CM | POA: Diagnosis present

## 2021-12-14 DIAGNOSIS — Z823 Family history of stroke: Secondary | ICD-10-CM

## 2021-12-14 DIAGNOSIS — G4733 Obstructive sleep apnea (adult) (pediatric): Secondary | ICD-10-CM | POA: Diagnosis present

## 2021-12-14 DIAGNOSIS — D75839 Thrombocytosis, unspecified: Secondary | ICD-10-CM | POA: Diagnosis present

## 2021-12-14 DIAGNOSIS — E119 Type 2 diabetes mellitus without complications: Secondary | ICD-10-CM | POA: Diagnosis present

## 2021-12-14 DIAGNOSIS — I152 Hypertension secondary to endocrine disorders: Secondary | ICD-10-CM | POA: Diagnosis present

## 2021-12-14 DIAGNOSIS — I9789 Other postprocedural complications and disorders of the circulatory system, not elsewhere classified: Secondary | ICD-10-CM

## 2021-12-14 DIAGNOSIS — Z825 Family history of asthma and other chronic lower respiratory diseases: Secondary | ICD-10-CM

## 2021-12-14 DIAGNOSIS — Z91014 Allergy to mammalian meats: Secondary | ICD-10-CM

## 2021-12-14 DIAGNOSIS — F32A Depression, unspecified: Secondary | ICD-10-CM | POA: Diagnosis present

## 2021-12-14 DIAGNOSIS — E1169 Type 2 diabetes mellitus with other specified complication: Secondary | ICD-10-CM | POA: Diagnosis present

## 2021-12-14 DIAGNOSIS — D649 Anemia, unspecified: Secondary | ICD-10-CM

## 2021-12-14 DIAGNOSIS — E11628 Type 2 diabetes mellitus with other skin complications: Secondary | ICD-10-CM | POA: Diagnosis present

## 2021-12-14 DIAGNOSIS — Z7982 Long term (current) use of aspirin: Secondary | ICD-10-CM

## 2021-12-14 DIAGNOSIS — E1159 Type 2 diabetes mellitus with other circulatory complications: Secondary | ICD-10-CM | POA: Diagnosis present

## 2021-12-14 DIAGNOSIS — E11621 Type 2 diabetes mellitus with foot ulcer: Secondary | ICD-10-CM | POA: Diagnosis present

## 2021-12-14 DIAGNOSIS — Z7902 Long term (current) use of antithrombotics/antiplatelets: Secondary | ICD-10-CM

## 2021-12-14 DIAGNOSIS — L97509 Non-pressure chronic ulcer of other part of unspecified foot with unspecified severity: Secondary | ICD-10-CM | POA: Diagnosis not present

## 2021-12-14 DIAGNOSIS — I96 Gangrene, not elsewhere classified: Principal | ICD-10-CM | POA: Diagnosis present

## 2021-12-14 DIAGNOSIS — I10 Essential (primary) hypertension: Secondary | ICD-10-CM | POA: Diagnosis not present

## 2021-12-14 DIAGNOSIS — A419 Sepsis, unspecified organism: Secondary | ICD-10-CM | POA: Diagnosis present

## 2021-12-14 DIAGNOSIS — Z89431 Acquired absence of right foot: Secondary | ICD-10-CM

## 2021-12-14 DIAGNOSIS — B9620 Unspecified Escherichia coli [E. coli] as the cause of diseases classified elsewhere: Secondary | ICD-10-CM | POA: Diagnosis not present

## 2021-12-14 DIAGNOSIS — Z794 Long term (current) use of insulin: Secondary | ICD-10-CM | POA: Diagnosis not present

## 2021-12-14 DIAGNOSIS — M659 Synovitis and tenosynovitis, unspecified: Secondary | ICD-10-CM | POA: Diagnosis present

## 2021-12-14 DIAGNOSIS — Z955 Presence of coronary angioplasty implant and graft: Secondary | ICD-10-CM

## 2021-12-14 DIAGNOSIS — M86172 Other acute osteomyelitis, left ankle and foot: Secondary | ICD-10-CM | POA: Diagnosis present

## 2021-12-14 DIAGNOSIS — Z8249 Family history of ischemic heart disease and other diseases of the circulatory system: Secondary | ICD-10-CM | POA: Diagnosis not present

## 2021-12-14 DIAGNOSIS — Z885 Allergy status to narcotic agent status: Secondary | ICD-10-CM

## 2021-12-14 DIAGNOSIS — Z79899 Other long term (current) drug therapy: Secondary | ICD-10-CM

## 2021-12-14 LAB — COMPREHENSIVE METABOLIC PANEL
ALT: 22 U/L (ref 0–44)
AST: 21 U/L (ref 15–41)
Albumin: 3.1 g/dL — ABNORMAL LOW (ref 3.5–5.0)
Alkaline Phosphatase: 82 U/L (ref 38–126)
Anion gap: 14 (ref 5–15)
BUN: 19 mg/dL (ref 8–23)
CO2: 21 mmol/L — ABNORMAL LOW (ref 22–32)
Calcium: 9.2 mg/dL (ref 8.9–10.3)
Chloride: 99 mmol/L (ref 98–111)
Creatinine, Ser: 1.32 mg/dL — ABNORMAL HIGH (ref 0.61–1.24)
GFR, Estimated: 59 mL/min — ABNORMAL LOW (ref >60)
Glucose, Bld: 91 mg/dL (ref 70–99)
Potassium: 4.3 mmol/L (ref 3.5–5.1)
Sodium: 134 mmol/L — ABNORMAL LOW (ref 135–145)
Total Bilirubin: 0.6 mg/dL (ref 0.3–1.2)
Total Protein: 8 g/dL (ref 6.5–8.1)

## 2021-12-14 LAB — CBC WITH DIFFERENTIAL/PLATELET
Abs Immature Granulocytes: 0.11 10*3/uL — ABNORMAL HIGH (ref 0.00–0.07)
Basophils Absolute: 0.1 10*3/uL (ref 0.0–0.1)
Basophils Relative: 0 %
Eosinophils Absolute: 0.1 10*3/uL (ref 0.0–0.5)
Eosinophils Relative: 1 %
HCT: 40.1 % (ref 39.0–52.0)
Hemoglobin: 12.5 g/dL — ABNORMAL LOW (ref 13.0–17.0)
Immature Granulocytes: 1 %
Lymphocytes Relative: 10 %
Lymphs Abs: 1.7 10*3/uL (ref 0.7–4.0)
MCH: 25.6 pg — ABNORMAL LOW (ref 26.0–34.0)
MCHC: 31.2 g/dL (ref 30.0–36.0)
MCV: 82.2 fL (ref 80.0–100.0)
Monocytes Absolute: 1.9 10*3/uL — ABNORMAL HIGH (ref 0.1–1.0)
Monocytes Relative: 11 %
Neutro Abs: 13 10*3/uL — ABNORMAL HIGH (ref 1.7–7.7)
Neutrophils Relative %: 77 %
Platelets: 598 10*3/uL — ABNORMAL HIGH (ref 150–400)
RBC: 4.88 MIL/uL (ref 4.22–5.81)
RDW: 14.8 % (ref 11.5–15.5)
WBC: 16.8 10*3/uL — ABNORMAL HIGH (ref 4.0–10.5)
nRBC: 0 % (ref 0.0–0.2)

## 2021-12-14 LAB — CBG MONITORING, ED: Glucose-Capillary: 212 mg/dL — ABNORMAL HIGH (ref 70–99)

## 2021-12-14 LAB — LACTIC ACID, PLASMA
Lactic Acid, Venous: 2.8 mmol/L (ref 0.5–1.9)
Lactic Acid, Venous: 1.8 mmol/L (ref 0.5–1.9)

## 2021-12-14 MED ORDER — LURASIDONE HCL 40 MG PO TABS
40.0000 mg | ORAL_TABLET | Freq: Every day | ORAL | Status: DC
  Administered 2021-12-16 – 2021-12-23 (×7): 40 mg via ORAL
  Filled 2021-12-14 (×11): qty 1

## 2021-12-14 MED ORDER — OXCARBAZEPINE 150 MG PO TABS
150.0000 mg | ORAL_TABLET | Freq: Two times a day (BID) | ORAL | Status: DC
  Administered 2021-12-15 – 2021-12-24 (×17): 150 mg via ORAL
  Filled 2021-12-14 (×24): qty 1

## 2021-12-14 MED ORDER — PANTOPRAZOLE SODIUM 40 MG PO TBEC
40.0000 mg | DELAYED_RELEASE_TABLET | Freq: Every day | ORAL | Status: DC
  Administered 2021-12-15 – 2021-12-23 (×10): 40 mg via ORAL
  Filled 2021-12-14 (×10): qty 1

## 2021-12-14 MED ORDER — BUPROPION HCL ER (XL) 150 MG PO TB24
300.0000 mg | ORAL_TABLET | Freq: Every morning | ORAL | Status: DC
  Administered 2021-12-15 – 2021-12-24 (×9): 300 mg via ORAL
  Filled 2021-12-14 (×6): qty 2
  Filled 2021-12-14 (×2): qty 1
  Filled 2021-12-14: qty 2

## 2021-12-14 MED ORDER — METRONIDAZOLE 500 MG/100ML IV SOLN
500.0000 mg | Freq: Two times a day (BID) | INTRAVENOUS | Status: DC
  Administered 2021-12-15 – 2021-12-20 (×12): 500 mg via INTRAVENOUS
  Filled 2021-12-14 (×12): qty 100

## 2021-12-14 MED ORDER — SODIUM CHLORIDE 0.9 % IV SOLN
2.0000 g | INTRAVENOUS | Status: DC
  Administered 2021-12-14 – 2021-12-15 (×2): 2 g via INTRAVENOUS
  Filled 2021-12-14 (×2): qty 20

## 2021-12-14 MED ORDER — ACETAMINOPHEN 650 MG RE SUPP: 650.0000 mg | Freq: Four times a day (QID) | RECTAL | Status: DC | PRN

## 2021-12-14 MED ORDER — ATORVASTATIN CALCIUM 80 MG PO TABS
80.0000 mg | ORAL_TABLET | Freq: Every day | ORAL | Status: DC
  Administered 2021-12-15 – 2021-12-23 (×10): 80 mg via ORAL
  Filled 2021-12-14: qty 1
  Filled 2021-12-14: qty 2
  Filled 2021-12-14 (×8): qty 1

## 2021-12-14 MED ORDER — AMLODIPINE BESYLATE 5 MG PO TABS
5.0000 mg | ORAL_TABLET | Freq: Every morning | ORAL | Status: DC
  Administered 2021-12-15 – 2021-12-24 (×9): 5 mg via ORAL
  Filled 2021-12-14 (×9): qty 1

## 2021-12-14 MED ORDER — FLUTICASONE FUROATE-VILANTEROL 100-25 MCG/ACT IN AEPB
1.0000 | INHALATION_SPRAY | Freq: Every day | RESPIRATORY_TRACT | Status: DC
  Administered 2021-12-16 – 2021-12-24 (×7): 1 via RESPIRATORY_TRACT
  Filled 2021-12-14 (×3): qty 28

## 2021-12-14 MED ORDER — LACTATED RINGERS IV SOLN: INTRAVENOUS | Status: DC

## 2021-12-14 MED ORDER — ALBUTEROL SULFATE (2.5 MG/3ML) 0.083% IN NEBU: 2.5000 mg | INHALATION_SOLUTION | Freq: Four times a day (QID) | RESPIRATORY_TRACT | Status: DC | PRN

## 2021-12-14 MED ORDER — INSULIN ASPART 100 UNIT/ML IJ SOLN
0.0000 [IU] | Freq: Three times a day (TID) | INTRAMUSCULAR | Status: DC
  Administered 2021-12-15 (×2): 1 [IU] via SUBCUTANEOUS
  Administered 2021-12-16: 2 [IU] via SUBCUTANEOUS

## 2021-12-14 MED ORDER — GABAPENTIN 300 MG PO CAPS
300.0000 mg | ORAL_CAPSULE | Freq: Two times a day (BID) | ORAL | Status: DC
  Administered 2021-12-15 – 2021-12-17 (×6): 300 mg via ORAL
  Filled 2021-12-14 (×6): qty 1

## 2021-12-14 MED ORDER — VANCOMYCIN HCL 1250 MG/250ML IV SOLN: 1250.0000 mg | INTRAVENOUS | Status: DC

## 2021-12-14 MED ORDER — OLANZAPINE 2.5 MG PO TABS
2.5000 mg | ORAL_TABLET | Freq: Every day | ORAL | Status: DC
  Administered 2021-12-15 – 2021-12-23 (×9): 2.5 mg via ORAL
  Filled 2021-12-14 (×12): qty 1

## 2021-12-14 MED ORDER — VANCOMYCIN HCL 2000 MG/400ML IV SOLN
2000.0000 mg | Freq: Once | INTRAVENOUS | Status: AC
  Administered 2021-12-15: 2000 mg via INTRAVENOUS
  Filled 2021-12-14: qty 400

## 2021-12-14 MED ORDER — ONDANSETRON HCL 4 MG/2ML IJ SOLN: 4.0000 mg | Freq: Four times a day (QID) | INTRAMUSCULAR | Status: DC | PRN

## 2021-12-14 MED ORDER — ONDANSETRON HCL 4 MG PO TABS: 4.0000 mg | ORAL_TABLET | Freq: Four times a day (QID) | ORAL | Status: DC | PRN

## 2021-12-14 MED ORDER — METRONIDAZOLE 500 MG/100ML IV SOLN: 500.0000 mg | Freq: Three times a day (TID) | INTRAVENOUS | Status: DC

## 2021-12-14 MED ORDER — METOPROLOL TARTRATE 50 MG PO TABS
50.0000 mg | ORAL_TABLET | Freq: Two times a day (BID) | ORAL | Status: DC
  Administered 2021-12-15 – 2021-12-24 (×19): 50 mg via ORAL
  Filled 2021-12-14 (×10): qty 1
  Filled 2021-12-14: qty 2
  Filled 2021-12-14 (×8): qty 1

## 2021-12-14 MED ORDER — ASPIRIN 81 MG PO TBEC
81.0000 mg | DELAYED_RELEASE_TABLET | Freq: Every day | ORAL | Status: DC
  Administered 2021-12-15 – 2021-12-24 (×10): 81 mg via ORAL
  Filled 2021-12-14 (×10): qty 1

## 2021-12-14 MED ORDER — HEPARIN SODIUM (PORCINE) 5000 UNIT/ML IJ SOLN
5000.0000 [IU] | Freq: Three times a day (TID) | INTRAMUSCULAR | Status: DC
  Administered 2021-12-15 – 2021-12-24 (×29): 5000 [IU] via SUBCUTANEOUS
  Filled 2021-12-14 (×29): qty 1

## 2021-12-14 MED ORDER — SODIUM CHLORIDE 0.9 % IV BOLUS
1000.0000 mL | Freq: Once | INTRAVENOUS | Status: AC
  Administered 2021-12-14: 1000 mL via INTRAVENOUS

## 2021-12-14 MED ORDER — INSULIN GLARGINE-YFGN 100 UNIT/ML ~~LOC~~ SOLN
10.0000 [IU] | Freq: Two times a day (BID) | SUBCUTANEOUS | Status: DC
Start: 2021-12-15 — End: 2021-12-20
  Administered 2021-12-15 – 2021-12-20 (×11): 10 [IU] via SUBCUTANEOUS
  Filled 2021-12-14 (×15): qty 0.1

## 2021-12-14 MED ORDER — CLOPIDOGREL BISULFATE 75 MG PO TABS
75.0000 mg | ORAL_TABLET | Freq: Every day | ORAL | Status: DC
  Administered 2021-12-15 – 2021-12-24 (×9): 75 mg via ORAL
  Filled 2021-12-14 (×9): qty 1

## 2021-12-14 MED ORDER — SENNOSIDES-DOCUSATE SODIUM 8.6-50 MG PO TABS
1.0000 | ORAL_TABLET | Freq: Every evening | ORAL | Status: DC | PRN
Start: 2021-12-14 — End: 2021-12-24

## 2021-12-14 MED ORDER — ACETAMINOPHEN 500 MG PO TABS
1000.0000 mg | ORAL_TABLET | Freq: Four times a day (QID) | ORAL | Status: DC | PRN
  Administered 2021-12-18: 1000 mg via ORAL
  Filled 2021-12-14: qty 2

## 2021-12-14 MED ORDER — HYDROCODONE-ACETAMINOPHEN 5-325 MG PO TABS
1.0000 | ORAL_TABLET | ORAL | Status: DC | PRN
  Administered 2021-12-15 – 2021-12-21 (×10): 2 via ORAL
  Administered 2021-12-22: 1 via ORAL
  Administered 2021-12-22: 2 via ORAL
  Filled 2021-12-14 (×6): qty 2
  Filled 2021-12-14: qty 1
  Filled 2021-12-14 (×5): qty 2

## 2021-12-14 NOTE — ED Provider Notes (Signed)
[facility] EMERGENCY DEPARTMENT Provider Note   CSN: [id number] Arrival date & time: 12/14/21  1525     History  Chief Complaint  Patient presents with   Wound Infection    [name] is a 66 y.o. male.  66 year old male with history of diabetes, neuropathy, HTN, presents with complaint of left foot swelling, redness, drainage from lateral aspect of the foot. 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 Patient with recent right MTA with complications, had left SFA intervention 12/08/21. Wife has been applying silver dressing to foot as directed by ID, recently dc Keflex. Denies pain in the foot. Reports foul smelling drainage from the lateral aspect of the left foot.        Home Medications Prior to Admission medications   Medication Sig Start Date End Date Taking? Authorizing Provider  albuterol (VENTOLIN HFA) 108 (90 Base) MCG/ACT inhaler Inhale 2 puffs into the lungs every 6 (six) hours as needed for wheezing or shortness of breath. 10/22/21   Medina-Vargas, Monina C, NP  amLODipine (NORVASC) 5 MG tablet Take 1 tablet (5 mg total) by mouth every morning. 10/22/21   Medina-Vargas, Monina C, NP  aspirin EC 81 MG tablet Take 1 tablet (81 mg total) by mouth daily. Swallow whole. 09/10/21 09/10/22  [name], MD  atorvastatin (LIPITOR) 80 MG tablet Take 1 tablet (80 mg total) by mouth at bedtime. 10/22/21   Medina-Vargas, Monina C, NP  buPROPion (WELLBUTRIN XL) 300 MG 24 hr tablet Take 1 tablet (300 mg total) by mouth every morning. 10/22/21   Medina-Vargas, Monina C, NP  cephALEXin (KEFLEX) 500 MG capsule Take 500 mg by mouth every 6 (six) hours. 11/27/21   [provider]  cholecalciferol (VITAMIN D) 25 MCG (1000 UNIT) tablet Take 1,000 Units by mouth daily.    [provider]  clopidogrel (PLAVIX) 75 MG tablet Take 1 tablet (75 mg total) by mouth daily. 12/03/21 12/03/22  [name], MD  empagliflozin  (JARDIANCE) 25 MG TABS tablet Take 1 tablet (25 mg total) by mouth daily. 10/22/21   Medina-Vargas, Monina C, NP  fluticasone furoate-vilanterol (BREO ELLIPTA) 100-25 MCG/INH AEPB Inhale 1 puff into the lungs daily. 02/11/21   Chesley Mires, MD  gabapentin (NEURONTIN) 300 MG capsule Take 1 capsule (300 mg total) by mouth 2 (two) times daily. 10/22/21   Medina-Vargas, Monina C, NP  gabapentin (NEURONTIN) 600 MG tablet Take 600 mg by mouth 2 (two) times daily. 02/21/21   [provider]  insulin aspart protamine- aspart (NOVOLOG MIX 70/30) (70-30) 100 UNIT/ML injection Inject 0.4 mLs (40 Units total) into the skin 2 (two) times daily with a meal. Patient taking differently: Inject 40-80 Units into the skin See admin instructions. 80 units in the morning, 40 units at bedtime 10/22/21   Medina-Vargas, Monina C, NP  lisinopril (ZESTRIL) 20 MG tablet Take 20 mg by mouth daily.    [provider]  lurasidone (LATUDA) 40 MG TABS tablet Take 1 tablet (40 mg total) by mouth daily with supper. 10/22/21   Medina-Vargas, Monina C, NP  Magnesium Hydroxide (MILK OF MAGNESIA PO) If no BM in 3 days, give 30 cc Milk of Magnesium p.o. x 1 dose in 24 hours as needed (Do not use standing constipation orders for residents with renal failure CFR less than 30. Contact MD for orders) Patient not taking: Reported on 12/05/2021    [provider]  meclizine (  ANTIVERT) 25 MG tablet Take 1 tablet (25 mg total) by mouth 3 (three) times daily as needed for dizziness. 10/22/21   Medina-Vargas, Monina C, NP  metoprolol tartrate (LOPRESSOR) 50 MG tablet Take 1 tablet (50 mg total) by mouth 2 (two) times daily. 10/22/21   Medina-Vargas, Monina C, NP  NON FORMULARY Heart healthy LCS diet    [provider]  OLANZapine (ZYPREXA) 2.5 MG tablet Take 1 tablet (2.5 mg total) by mouth at bedtime. 10/22/21   Medina-Vargas, Monina C, NP  omeprazole (PRILOSEC) 20 MG capsule Take 1 capsule (20 mg total) by mouth at bedtime.  10/22/21   Medina-Vargas, Monina C, NP  OXcarbazepine (TRILEPTAL) 150 MG tablet Take 1 tablet (150 mg total) by mouth 2 (two) times daily. 10/22/21   Medina-Vargas, Monina C, NP  oxyCODONE (ROXICODONE) 15 MG immediate release tablet Take 15 mg by mouth 4 (four) times daily as needed. 11/27/21   [provider]  Oxycodone HCl 10 MG TABS Take 1 tablet (10 mg total) by mouth every 6 (six) hours as needed. Patient not taking: Reported on 12/05/2021 10/22/21   Medina-Vargas, Monina C, NP      Allergies    Gemfibrozil, Oxycontin [oxycodone hcl], Pork-derived products, and Simvastatin    Review of Systems   Review of Systems Negative except as per HPI Physical Exam Updated Vital Signs BP 123/69   Pulse (!) 102   Temp 98.4 F (36.9 C) (Oral)   Resp 18   SpO2 97%  Physical Exam Vitals and nursing note reviewed.  Constitutional:      General: He is not in acute distress.    Appearance: He is well-developed. He is not diaphoretic.  HENT:     Head: Normocephalic and atraumatic.  Pulmonary:     Effort: Pulmonary effort is normal.  Musculoskeletal:        General: Swelling present. No tenderness.     Comments: Erythema to left foot with streaking up anterior left lower leg to mid leg, mild/moderate swelling of the foot, non tender. Unable to obtain DP or PT pulse by doppler (in hall chair) Gangrene of left great toe No active drainage from wound along lateral foot near 5th MTP  Skin:    General: Skin is warm.     Findings: Erythema present.  Neurological:     Mental Status: He is alert and oriented to person, place, and time.  Psychiatric:        Behavior: Behavior normal.               ED Results / Procedures / Treatments   Labs (all labs ordered are listed, but only abnormal results are displayed) Labs Reviewed  LACTIC ACID, PLASMA - Abnormal; Notable for the following components:      Result Value   Lactic Acid, Venous 2.8 (*)    All other components within  normal limits  CBC WITH DIFFERENTIAL/PLATELET - Abnormal; Notable for the following components:   WBC 16.8 (*)    Hemoglobin 12.5 (*)    MCH 25.6 (*)    Platelets 598 (*)    Neutro Abs 13.0 (*)    Monocytes Absolute 1.9 (*)    Abs Immature Granulocytes 0.11 (*)    All other components within normal limits  COMPREHENSIVE METABOLIC PANEL - Abnormal; Notable for the following components:   Sodium 134 (*)    CO2 21 (*)    Creatinine, Ser 1.32 (*)    Albumin 3.1 (*)    GFR,  Estimated 59 (*)    All other components within normal limits  CULTURE, BLOOD (ROUTINE X 2)  CULTURE, BLOOD (ROUTINE X 2)  LACTIC ACID, PLASMA  PREALBUMIN  BASIC METABOLIC PANEL  CBC    EKG None  Radiology DG Foot Complete Left  Result Date: 12/14/2021 CLINICAL DATA:  Diabetic foot wound, possible gangrene of great toe EXAM: LEFT FOOT - COMPLETE 3+ VIEW COMPARISON:  None Available. FINDINGS: No fracture or dislocation of the left foot. No bony erosion or sclerosis. Subcutaneous soft tissue gas about the left fifth metatarsal and metatarsophalangeal joint, and possibly additionally about the tip of the great toe. Diffuse soft tissue edema about the forefoot. IMPRESSION: 1. Subcutaneous soft tissue gas about the left fifth metatarsal and metatarsophalangeal joint, and possibly additionally about the tip of the great toe. Findings are concerning for gas-forming infection. 2. No bony erosion or sclerosis to suggest osteomyelitis. MRI is the most sensitive test to detect bone marrow edema and osteomyelitis if clinically suspected. 3.  No fracture or dislocation of the left foot. These results were called by telephone at the time of interpretation on 12/14/2021 at 4:32 pm to [facility], [place], who verbally acknowledged these results. Electronically Signed   By: Delanna Ahmadi M.D.   On: 12/14/2021 16:51    Procedures Procedures    Medications Ordered in ED Medications  sodium chloride 0.9 % bolus 1,000 mL (has no  administration in time range)  heparin injection 5,000 Units (has no administration in time range)  cefTRIAXone (ROCEPHIN) 2 g in sodium chloride 0.9 % 100 mL IVPB (has no administration in time range)  metroNIDAZOLE (FLAGYL) IVPB 500 mg (has no administration in time range)  acetaminophen (TYLENOL) tablet 1,000 mg (has no administration in time range)    Or  acetaminophen (TYLENOL) suppository 650 mg (has no administration in time range)  lactated ringers infusion (has no administration in time range)  ondansetron (ZOFRAN) tablet 4 mg (has no administration in time range)    Or  ondansetron (ZOFRAN) injection 4 mg (has no administration in time range)  senna-docusate (Senokot-S) tablet 1 tablet (has no administration in time range)  HYDROcodone-acetaminophen (NORCO/VICODIN) 5-325 MG per tablet 1-2 tablet (has no administration in time range)  insulin aspart (novoLOG) injection 0-6 Units (has no administration in time range)    ED Course/ Medical Decision Making/ A&P                           Medical Decision Making  This patient presents to the ED for concern of left foot swelling, redness, drainage, this involves an extensive number of treatment options, and is a complaint that carries with it a high risk of complications and morbidity.  The differential diagnosis includes but not limited to peripheral vascular disease, gangrene, osteomyelitis, cellulitis     Co morbidities that complicate the patient evaluation  Diabetes, hypertension, neuropathy   Additional history obtained:  Additional history obtained from patient's wife at bedside who reports surgery last Monday, complaints over dressings at home, no longer on antibiotics External records from outside source obtained and reviewed including recent visit to ID on 12/10/21 notes need for further surgical management/I&D and flap coverage of the right foot site   Lab Tests:  I Ordered, and personally interpreted labs.  The  pertinent results include:  CBC with leukocytosis/16.8, increase in neutorphila. CMP with elevated Cr to 1032 (previously 0.8); lactic acid 1.8   Imaging Studies ordered:  I ordered imaging studies including XR left foot  I independently visualized and interpreted imaging which showed gas in soft tissue along 5th metatarsal  I agree with the radiologist interpretation  Consultations Obtained:  I requested consultation with the vascular surgeon, Dr. Donzetta Matters,  and discussed lab and imaging findings as well as pertinent plan - they recommend: admit to medicine service, abx ordered by Dr. Donzetta Matters, podiatry to follow.  Discussed with Dr. Posey Pronto with [facility] service who will admit.    Problem List / ED Course / Critical interventions / Medication management  66 year old male with left foot infection as above.  Does have swelling of the foot with erythema, streaking to mid anterior lower leg.  Does have wound to lateral aspect of left foot, no active drainage at this time.  Concern for gangrene of left great toe.  X-ray with gas along lateral aspect of foot.  Discussed with vascular surgery who has seen the patient, confirmed PT pulse is present.  Antibiotics ordered.  Discussed with hospitalist who will consult for admission. I have reviewed the patients home medicines and have made adjustments as needed   Social Determinants of Health:  Followed by podiatry, vascular surgery, infectious disease.    Test / Admission - Considered:  Admit for abx and further treatment MRI pending for osteomyelitis left foot         Final Clinical Impression(s) / ED Diagnoses Final diagnoses:  Gangrene of left foot ([place])  AKI (acute kidney injury) [facility])    Rx / [place] Orders ED Discharge Orders     None         Roque Lias 12/14/21 2004    Davonna Belling, MD 12/15/21 [id number]

## 2021-12-14 NOTE — Progress Notes (Signed)
Pharmacy Antibiotic Note  [name] is a 66 y.o. male admitted on 12/14/2021 with foul smelling left great toe with drainage.  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  Pharmacy has been consulted for vancomycin dosing.  Patient is also on Rocephin and Flagyl.  SCr 1.32 (BL SCr 0.7-0.8), afebrile, WBC 16.8.  Plan: Vanc 2gm IV x 1, then '1250mg'$  IV Q24H for AUC 505 using SCr 1.32 Rocephin 2gm IV Q24H and Flagyl '500mg'$  IV Q12H Monitor renal fxn, clinical progress, vanc level as indicated F/u MRI    Temp (24hrs), Avg:98.4 F (36.9 C), Min:98.4 F (36.9 C), Max:98.4 F (36.9 C)  Recent Labs  Lab 12/08/21 1336 12/14/21 1540 12/14/21 1740  WBC  --  16.8*  --   CREATININE 0.80 1.32*  --   LATICACIDVEN  --  2.8* 1.8    Estimated Creatinine Clearance: 63 mL/min (A) (by C-G formula based on SCr of 1.32 mg/dL (H)).    Allergies  Allergen Reactions   Gemfibrozil Other (See Comments)    Doesn't know its been awhile   Oxycontin [Oxycodone Hcl] Other (See Comments)    Bad reaction "took me to a place I never wanna go again"   Pork-Derived Products    Simvastatin Other (See Comments)    Says he doesn't know its been awhile    Rocephin 6/11 >> Flagyl 6/11 >> Vanc 6/11 >>   6/11 BCx -   Mykira Hofmeister D. Mina Marble, PharmD, BCPS, [place] 12/14/2021, 8:20 PM

## 2021-12-14 NOTE — Assessment & Plan Note (Signed)
Continue atorvastatin

## 2021-12-14 NOTE — Assessment & Plan Note (Signed)
Continue home bupropion, Latuda, Trileptal, Zyprexa.

## 2021-12-14 NOTE — H&P (Signed)
Hospital Consult    Reason for Consult:  left foot pain and drainage Referring Physician:  Dr. Alvino Chapel MRN #:  [id number]  History of Present Illness: This is a 66 y.o. male has a history of right below-knee popliteal artery to posterior tibial artery bypass and fourth metatarsal ray amputation followed by right transmetatarsal amputation by podiatry on March 17 of this year.  More recently he is followed for left great toe gangrene.  1 week ago he underwent angiography with stenting of the left SFA and balloon angioplasty of the left posterior tibial artery.  He states that he was on antibiotics until Wednesday and then stop then began having pain and drainage from the left small toe which was new.  Per the wife he has had fevers as well with decreased appetite.  He is now having severe pain in the left foot.  He is taking aspirin, Plavix and a statin.  Past Medical History:  Diagnosis Date   Diabetes mellitus with peripheral vascular disease    Hypertension    Neuropathy    PTSD (post-traumatic stress disorder)    Sleep apnea     Past Surgical History:  Procedure Laterality Date   ABDOMINAL AORTOGRAM W/LOWER EXTREMITY Right 09/10/2021   Procedure: ABDOMINAL AORTOGRAM W/LOWER EXTREMITY;  Surgeon: [name], MD;  Location: [facility];  Service: Cardiovascular;  Laterality: Right;   ABDOMINAL AORTOGRAM W/LOWER EXTREMITY N/A 12/03/2021   Procedure: ABDOMINAL AORTOGRAM W/LOWER EXTREMITY;  Surgeon: [name], MD;  Location: [facility];  Service: Cardiovascular;  Laterality: N/A;   ABDOMINAL HERNIA REPAIR     AMPUTATION Right 09/19/2021   Procedure: TRANSMETATARSAL AMPUTATION FOOT;  Surgeon: Lorenda Peck, MD;  Location: [facility];  Service: Podiatry;  Laterality: Right;  Surgical team to do local block   AMPUTATION TOE Right 09/15/2021   Procedure: AMPUTATION 4th TOE;  Surgeon: [name], MD;  Location: [facility];  Service: Vascular;  Laterality: Right;   BACK  SURGERY     CORONARY ATHERECTOMY N/A 12/08/2021   Procedure: CORONARY ATHERECTOMY;  Surgeon: Waynetta Sandy, MD;  Location: [facility];  Service: Cardiovascular;  Laterality: N/A;  Laser - Lt. PT   FEMORAL-TIBIAL BYPASS GRAFT Right 09/15/2021   Procedure: BYPASS GRAFT BELOW KNEE POPLITEAL ARTERY TO POSTERIOR TIBIAL ARTERY;  Surgeon: [name], MD;  Location: [facility];  Service: Vascular;  Laterality: Right;   IRRIGATION AND DEBRIDEMENT FOOT Right 09/17/2021   Procedure: IRRIGATION AND DEBRIDEMENT FOOT;  Surgeon: Lorenda Peck, MD;  Location: [facility];  Service: Podiatry;  Laterality: Right;  Suregeon will do anesthesia block   KNEE SURGERY     LOWER EXTREMITY ANGIOGRAPHY Left 12/08/2021   Procedure: Lower Extremity Angiography;  Surgeon: Waynetta Sandy, MD;  Location: [facility];  Service: Cardiovascular;  Laterality: Left;   PERIPHERAL VASCULAR BALLOON ANGIOPLASTY  09/10/2021   Procedure: PERIPHERAL VASCULAR BALLOON ANGIOPLASTY;  Surgeon: [name], MD;  Location: [facility];  Service: Cardiovascular;;  rt sfa pta   PERIPHERAL VASCULAR BALLOON ANGIOPLASTY Right 12/03/2021   Procedure: PERIPHERAL VASCULAR BALLOON ANGIOPLASTY;  Surgeon: [name], MD;  Location: [facility];  Service: Cardiovascular;  Laterality: Right;   PERIPHERAL VASCULAR INTERVENTION Left 12/08/2021   Procedure: PERIPHERAL VASCULAR INTERVENTION;  Surgeon: Waynetta Sandy, MD;  Location: [facility];  Service: Cardiovascular;  Laterality: Left;  LT. SFA   SHOULDER SURGERY     VEIN HARVEST Right  09/15/2021   Procedure: GREATER SAPHENOUS VEIN HARVEST;  Surgeon: [name], MD;  Location: [facility];  Service: Vascular;  Laterality: Right;    Allergies  Allergen Reactions   Gemfibrozil Other (See Comments)    Doesn't know its been awhile   Oxycontin [Oxycodone Hcl] Other (See Comments)    Bad reaction "took me to a place I never wanna go again"    Pork-Derived Products    Simvastatin Other (See Comments)    Says he doesn't know its been awhile    Prior to Admission medications   Medication Sig Start Date End Date Taking? Authorizing Provider  albuterol (VENTOLIN HFA) 108 (90 Base) MCG/ACT inhaler Inhale 2 puffs into the lungs every 6 (six) hours as needed for wheezing or shortness of breath. 10/22/21   Medina-Vargas, Monina C, NP  amLODipine (NORVASC) 5 MG tablet Take 1 tablet (5 mg total) by mouth every morning. 10/22/21   Medina-Vargas, Monina C, NP  aspirin EC 81 MG tablet Take 1 tablet (81 mg total) by mouth daily. Swallow whole. 09/10/21 09/10/22  [name], MD  atorvastatin (LIPITOR) 80 MG tablet Take 1 tablet (80 mg total) by mouth at bedtime. 10/22/21   Medina-Vargas, Monina C, NP  buPROPion (WELLBUTRIN XL) 300 MG 24 hr tablet Take 1 tablet (300 mg total) by mouth every morning. 10/22/21   Medina-Vargas, Monina C, NP  cephALEXin (KEFLEX) 500 MG capsule Take 500 mg by mouth every 6 (six) hours. 11/27/21   [provider]  cholecalciferol (VITAMIN D) 25 MCG (1000 UNIT) tablet Take 1,000 Units by mouth daily.    [provider]  clopidogrel (PLAVIX) 75 MG tablet Take 1 tablet (75 mg total) by mouth daily. 12/03/21 12/03/22  [name], MD  empagliflozin (JARDIANCE) 25 MG TABS tablet Take 1 tablet (25 mg total) by mouth daily. 10/22/21   Medina-Vargas, Monina C, NP  fluticasone furoate-vilanterol (BREO ELLIPTA) 100-25 MCG/INH AEPB Inhale 1 puff into the lungs daily. 02/11/21   Chesley Mires, MD  gabapentin (NEURONTIN) 300 MG capsule Take 1 capsule (300 mg total) by mouth 2 (two) times daily. 10/22/21   Medina-Vargas, Monina C, NP  gabapentin (NEURONTIN) 600 MG tablet Take 600 mg by mouth 2 (two) times daily. 02/21/21   [provider]  insulin aspart protamine- aspart (NOVOLOG MIX 70/30) (70-30) 100 UNIT/ML injection Inject 0.4 mLs (40 Units total) into the skin 2 (two) times daily with a meal. Patient taking  differently: Inject 40-80 Units into the skin See admin instructions. 80 units in the morning, 40 units at bedtime 10/22/21   Medina-Vargas, Monina C, NP  lisinopril (ZESTRIL) 20 MG tablet Take 20 mg by mouth daily.    [provider]  lurasidone (LATUDA) 40 MG TABS tablet Take 1 tablet (40 mg total) by mouth daily with supper. 10/22/21   Medina-Vargas, Monina C, NP  Magnesium Hydroxide (MILK OF MAGNESIA PO) If no BM in 3 days, give 30 cc Milk of Magnesium p.o. x 1 dose in 24 hours as needed (Do not use standing constipation orders for residents with renal failure CFR less than 30. Contact MD for orders) Patient not taking: Reported on 12/05/2021    [provider]  meclizine (ANTIVERT) 25 MG tablet Take 1 tablet (25 mg total) by mouth 3 (three) times daily as needed for dizziness. 10/22/21   Medina-Vargas, Monina C, NP  metoprolol tartrate (LOPRESSOR) 50 MG tablet Take 1 tablet (50 mg total) by mouth 2 (two) times daily. 10/22/21  Medina-Vargas, Monina C, NP  NON FORMULARY Heart healthy LCS diet    [provider]  OLANZapine (ZYPREXA) 2.5 MG tablet Take 1 tablet (2.5 mg total) by mouth at bedtime. 10/22/21   Medina-Vargas, Monina C, NP  omeprazole (PRILOSEC) 20 MG capsule Take 1 capsule (20 mg total) by mouth at bedtime. 10/22/21   Medina-Vargas, Monina C, NP  OXcarbazepine (TRILEPTAL) 150 MG tablet Take 1 tablet (150 mg total) by mouth 2 (two) times daily. 10/22/21   Medina-Vargas, Monina C, NP  oxyCODONE (ROXICODONE) 15 MG immediate release tablet Take 15 mg by mouth 4 (four) times daily as needed. 11/27/21   [provider]  Oxycodone HCl 10 MG TABS Take 1 tablet (10 mg total) by mouth every 6 (six) hours as needed. Patient not taking: Reported on 12/05/2021 10/22/21   Medina-Vargas, Senaida Lange, NP    Social History   Socioeconomic History   Marital status: Married    Spouse name: Not on file   Number of children: Not on file   Years of education: Not on file    Highest education level: Not on file  Occupational History   Not on file  Tobacco Use   Smoking status: Former    Types: Cigarettes    Passive exposure: Never   Smokeless tobacco: Never  Vaping Use   Vaping Use: Never used  Substance and Sexual Activity   Alcohol use: No   Drug use: Never   Sexual activity: Not on file  Other Topics Concern   Not on file  Social History Narrative   Not on file   Social Determinants of Health   Financial Resource Strain: Not on file  Food Insecurity: Not on file  Transportation Needs: Not on file  Physical Activity: Not on file  Stress: Not on file  Social Connections: Not on file  Intimate Partner Violence: Not on file     Family History  Problem Relation Age of Onset   Stroke Mother    Coronary artery disease Father    COPD Brother     Review of Systems  Constitutional:  Positive for chills, fever and malaise/fatigue.  HENT: Negative.    Eyes: Negative.   Respiratory: Negative.    Cardiovascular: Negative.   Gastrointestinal: Negative.   Musculoskeletal:        Foot pain  Skin: Negative.   Neurological: Negative.   Endo/Heme/Allergies: Negative.   Psychiatric/Behavioral: Negative.      Physical Examination  Vitals:   12/14/21 1532 12/14/21 1759  BP: 110/62 123/69  Pulse: 93 (!) 102  Resp: 15 18  Temp: 98.4 F (36.9 C)   SpO2: 95% 97%   There is no height or weight on file to calculate BMI. Physical Activity: Not on file   Physical Exam HENT:     Head: Normocephalic.     Nose: Nose normal.  Eyes:     Pupils: Pupils are equal, round, and reactive to light.  Cardiovascular:     Rate and Rhythm: Tachycardia present.     Pulses:          Popliteal pulses are 2+ on the left side.     Comments: Multiphasic left posterior tibial signal Pulmonary:     Effort: Pulmonary effort is normal.  Abdominal:     General: Abdomen is flat.     Palpations: Abdomen is soft.  Musculoskeletal:     Cervical back: Normal  range of motion.     Left lower leg: Edema present.  Skin:    Findings: Erythema present.  Neurological:     General: No focal deficit present.     Mental Status: He is alert. Mental status is at baseline.         CBC    Component Value Date/Time   WBC 16.8 (H) 12/14/2021 1540   RBC 4.88 12/14/2021 1540   HGB 12.5 (L) 12/14/2021 1540   HCT 40.1 12/14/2021 1540   PLT 598 (H) 12/14/2021 1540   MCV 82.2 12/14/2021 1540   MCH 25.6 (L) 12/14/2021 1540   MCHC 31.2 12/14/2021 1540   RDW 14.8 12/14/2021 1540   LYMPHSABS 1.7 12/14/2021 1540   MONOABS 1.9 (H) 12/14/2021 1540   EOSABS 0.1 12/14/2021 1540   BASOSABS 0.1 12/14/2021 1540    BMET    Component Value Date/Time   NA 134 (L) 12/14/2021 1540   NA 135 (A) 10/07/2021 0000   K 4.3 12/14/2021 1540   CL 99 12/14/2021 1540   CO2 21 (L) 12/14/2021 1540   GLUCOSE 91 12/14/2021 1540   BUN 19 12/14/2021 1540   BUN 12 10/07/2021 0000   CREATININE 1.32 (H) 12/14/2021 1540   CALCIUM 9.2 12/14/2021 1540   GFRNONAA 59 (L) 12/14/2021 1540   GFRAA >60 02/17/2015 2030    COAGS: No results found for: "INR", "PROTIME"   Non-Invasive Vascular Imaging:   Left foot xray IMPRESSION: 1. Subcutaneous soft tissue gas about the left fifth metatarsal and metatarsophalangeal joint, and possibly additionally about the tip of the great toe. Findings are concerning for gas-forming infection.   2. No bony erosion or sclerosis to suggest osteomyelitis. MRI is the most sensitive test to detect bone marrow edema and osteomyelitis if clinically suspected.   3.  No fracture or dislocation of the left foot.  ASSESSMENT/PLAN: This is a 66 y.o. male with previous right lower extremity bypass transmetatarsal amputation that is followed by podiatry and now with left great toe gangrene status post endovascular revascularization with new left fifth toe infection and drainage as pictured above.  He has multiphasic posterior tibial signal to suggest  patency of recent intervention.  I will order a duplex.  He will need antibiotics and podiatry evaluation.  I will notify his primary surgeon of his admission.  Jakira Mcfadden C. Donzetta Matters, MD [facility] Office: [phone] Pager: [phone]

## 2021-12-14 NOTE — Assessment & Plan Note (Addendum)
PAD  S/p Rt transmetatarsal amputation 09/19/2021 S/p left posterior tibial artery balloon angioplasty and stent to left SFA 12/08/2021 Gangrene left first toe with new left lateral fifth toe wound infection.  Seen by vascular surgery, recommended antibiotics and podiatry evaluation. -Start on IV vancomycin, ceftriaxone, Flagyl -Follow blood cultures -MRI left foot -Continue aspirin and Plavix given recent stenting -Vascular surgery following -Podiatry consult in a.m.

## 2021-12-14 NOTE — Assessment & Plan Note (Signed)
Basal insulin Semglee 10 units twice daily and SSI.

## 2021-12-14 NOTE — ED Triage Notes (Signed)
Patient is a diabetic and has already had amputation of toes on right foot now having wound and foul drainage from left great toe.  Great toe is black.

## 2021-12-14 NOTE — ED Notes (Signed)
Dr. Donzetta Matters at bedside.

## 2021-12-14 NOTE — H&P (Signed)
History and Physical    [name] NUU:[id number] DOB: January 18, 1956 DOA: 12/14/2021  PCP: Harvie Junior, MD  Patient coming from: Home  I have personally briefly reviewed patient's old medical records in [place]  Chief Complaint: Left foot infection  HPI: [name] is a 66 y.o. male with medical history significant for T2DM, HTN, HLD, COPD, depression, OSA not using CPAP, PVD (s/p right below-knee popliteal artery to posterior tibial artery bypass and right transmetatarsal amputation 09/19/2021, s/p left posterior tibial artery balloon angioplasty and stent to left SFA 12/08/2021) who is admitted with left foot infection.  Patient with recent admission for right diabetic foot infection s/p right below-knee popliteal artery to posterior tibial artery bypass and eventual right transmetatarsal amputation on 09/19/2021.  He has been following with vascular surgery, wound care, ID as an outpatient.  More recently followed for left first toe gangrene.  Underwent angiography on 6/5 with left posterior tibial artery balloon angioplasty and stenting to left SFA.  He has been on aspirin and Plavix.  He was previously on antibiotics with Keflex which was discontinued by ID 4 days ago.  Since then he noticed new wound to his left lateral foot.  This has been draining bloody discharge.  He has not had any pain to the area.  He denies any subjective fevers, chills, diaphoresis, chest pain, dyspnea, nausea, vomiting.  ED Course  Labs/Imaging on admission: I have personally reviewed following labs and imaging studies.  Initial vitals showed BP 110/62, pulse 93, RR 15, temp 98.4 F, SPO2 95% room air.  Labs show WBC 16.8, hemoglobin 12.5, platelets 598,000, sodium 134, potassium 4.3, bicarb 21, BUN 19, creatinine 1.32, serum glucose 91, LFTs within normal limits, lactic acid 2.8 >1.8.  Blood cultures in process.  Left foot x-ray showed subcutaneous soft tissue gas about the left fifth  metatarsal and metatarsophalangeal joint and possibly additionally about the tip of the great toe.  No bony erosion or sclerosis to suggest osteomyelitis seen.  Patient was given 1 L normal saline.  MRI left foot ordered and pending.  Vascular surgery were consulted.  Dr. Carolin Guernsey evaluated patient and recommended medical admission, antibiotics, podiatry consult tomorrow.  Hospitalist service was consulted to admit for further evaluation and management.  Review of Systems: All systems reviewed and are negative except as documented in history of present illness above.   Past Medical History:  Diagnosis Date   Diabetes mellitus with peripheral vascular disease    Hypertension    Neuropathy    PTSD (post-traumatic stress disorder)    Sleep apnea     Past Surgical History:  Procedure Laterality Date   ABDOMINAL AORTOGRAM W/LOWER EXTREMITY Right 09/10/2021   Procedure: ABDOMINAL AORTOGRAM W/LOWER EXTREMITY;  Surgeon: [name], MD;  Location: [place] [facility];  Service: Cardiovascular;  Laterality: Right;   ABDOMINAL AORTOGRAM W/LOWER EXTREMITY N/A 12/03/2021   Procedure: ABDOMINAL AORTOGRAM W/LOWER EXTREMITY;  Surgeon: [name], MD;  Location: [place] [facility];  Service: Cardiovascular;  Laterality: N/A;   ABDOMINAL HERNIA REPAIR     AMPUTATION Right 09/19/2021   Procedure: TRANSMETATARSAL AMPUTATION FOOT;  Surgeon: Lorenda Peck, MD;  Location: [place];  Service: Podiatry;  Laterality: Right;  Surgical team to do local block   AMPUTATION TOE Right 09/15/2021   Procedure: AMPUTATION 4th TOE;  Surgeon: [name], MD;  Location: [facility];  Service: Vascular;  Laterality: Right;   BACK SURGERY     CORONARY ATHERECTOMY N/A  12/08/2021   Procedure: CORONARY ATHERECTOMY;  Surgeon: Waynetta Sandy, MD;  Location: [place] [facility];  Service: Cardiovascular;  Laterality: N/A;  Laser - Lt. PT   FEMORAL-TIBIAL BYPASS GRAFT Right 09/15/2021   Procedure: BYPASS GRAFT BELOW KNEE  POPLITEAL ARTERY TO POSTERIOR TIBIAL ARTERY;  Surgeon: [name], MD;  Location: [place];  Service: Vascular;  Laterality: Right;   IRRIGATION AND DEBRIDEMENT FOOT Right 09/17/2021   Procedure: IRRIGATION AND DEBRIDEMENT FOOT;  Surgeon: Lorenda Peck, MD;  Location: [place];  Service: Podiatry;  Laterality: Right;  Suregeon will do anesthesia block   KNEE SURGERY     LOWER EXTREMITY ANGIOGRAPHY Left 12/08/2021   Procedure: Lower Extremity Angiography;  Surgeon: Waynetta Sandy, MD;  Location: [place] [facility];  Service: Cardiovascular;  Laterality: Left;   PERIPHERAL VASCULAR BALLOON ANGIOPLASTY  09/10/2021   Procedure: PERIPHERAL VASCULAR BALLOON ANGIOPLASTY;  Surgeon: [name], MD;  Location: [place] [facility];  Service: Cardiovascular;;  rt sfa pta   PERIPHERAL VASCULAR BALLOON ANGIOPLASTY Right 12/03/2021   Procedure: PERIPHERAL VASCULAR BALLOON ANGIOPLASTY;  Surgeon: [name], MD;  Location: [place] [facility];  Service: Cardiovascular;  Laterality: Right;   PERIPHERAL VASCULAR INTERVENTION Left 12/08/2021   Procedure: PERIPHERAL VASCULAR INTERVENTION;  Surgeon: Waynetta Sandy, MD;  Location: [place] [facility];  Service: Cardiovascular;  Laterality: Left;  LT. SFA   SHOULDER SURGERY     VEIN HARVEST Right 09/15/2021   Procedure: GREATER SAPHENOUS VEIN HARVEST;  Surgeon: [name], MD;  Location: [facility] OR;  Service: Vascular;  Laterality: Right;    Social History:  reports that he has quit smoking. His smoking use included cigarettes. He has never been exposed to tobacco smoke. He has never used smokeless tobacco. He reports that he does not drink alcohol and does not use drugs.  Allergies  Allergen Reactions   Gemfibrozil Other (See Comments)    Doesn't know its been awhile   Oxycontin [Oxycodone Hcl] Other (See Comments)    Bad reaction "took me to a place I never wanna go again"   Pork-Derived Products    Simvastatin Other (See Comments)     Says he doesn't know its been awhile    Family History  Problem Relation Age of Onset   Stroke Mother    Coronary artery disease Father    COPD Brother      Prior to Admission medications   Medication Sig Start Date End Date Taking? Authorizing Provider  albuterol (VENTOLIN HFA) 108 (90 Base) MCG/ACT inhaler Inhale 2 puffs into the lungs every 6 (six) hours as needed for wheezing or shortness of breath. 10/22/21   Medina-Vargas, Monina C, NP  amLODipine (NORVASC) 5 MG tablet Take 1 tablet (5 mg total) by mouth every morning. 10/22/21   Medina-Vargas, Monina C, NP  aspirin EC 81 MG tablet Take 1 tablet (81 mg total) by mouth daily. Swallow whole. 09/10/21 09/10/22  [name], MD  atorvastatin (LIPITOR) 80 MG tablet Take 1 tablet (80 mg total) by mouth at bedtime. 10/22/21   Medina-Vargas, Monina C, NP  buPROPion (WELLBUTRIN XL) 300 MG 24 hr tablet Take 1 tablet (300 mg total) by mouth every morning. 10/22/21   Medina-Vargas, Monina C, NP  cephALEXin (KEFLEX) 500 MG capsule Take 500 mg by mouth every 6 (six) hours. 11/27/21   [provider]  cholecalciferol (VITAMIN D) 25 MCG (1000 UNIT) tablet Take 1,000 Units by mouth daily.    [provider]  clopidogrel (PLAVIX) 75 MG tablet Take 1 tablet (75 mg total) by mouth daily. 12/03/21 12/03/22  [name], MD  empagliflozin (JARDIANCE) 25 MG TABS tablet Take 1 tablet (25 mg total) by mouth daily. 10/22/21   Medina-Vargas, Monina C, NP  fluticasone furoate-vilanterol (BREO ELLIPTA) 100-25 MCG/INH AEPB Inhale 1 puff into the lungs daily. 02/11/21   Chesley Mires, MD  gabapentin (NEURONTIN) 300 MG capsule Take 1 capsule (300 mg total) by mouth 2 (two) times daily. 10/22/21   Medina-Vargas, Monina C, NP  gabapentin (NEURONTIN) 600 MG tablet Take 600 mg by mouth 2 (two) times daily. 02/21/21   [provider]  insulin aspart protamine- aspart (NOVOLOG MIX 70/30) (70-30) 100 UNIT/ML injection Inject 0.4 mLs (40 Units total) into  the skin 2 (two) times daily with a meal. Patient taking differently: Inject 40-80 Units into the skin See admin instructions. 80 units in the morning, 40 units at bedtime 10/22/21   Medina-Vargas, Monina C, NP  lisinopril (ZESTRIL) 20 MG tablet Take 20 mg by mouth daily.    [provider]  lurasidone (LATUDA) 40 MG TABS tablet Take 1 tablet (40 mg total) by mouth daily with supper. 10/22/21   Medina-Vargas, Monina C, NP  Magnesium Hydroxide (MILK OF MAGNESIA PO) If no BM in 3 days, give 30 cc Milk of Magnesium p.o. x 1 dose in 24 hours as needed (Do not use standing constipation orders for residents with renal failure CFR less than 30. Contact MD for orders) Patient not taking: Reported on 12/05/2021    [provider]  meclizine (ANTIVERT) 25 MG tablet Take 1 tablet (25 mg total) by mouth 3 (three) times daily as needed for dizziness. 10/22/21   Medina-Vargas, Monina C, NP  metoprolol tartrate (LOPRESSOR) 50 MG tablet Take 1 tablet (50 mg total) by mouth 2 (two) times daily. 10/22/21   Medina-Vargas, Monina C, NP  NON FORMULARY Heart healthy LCS diet    [provider]  OLANZapine (ZYPREXA) 2.5 MG tablet Take 1 tablet (2.5 mg total) by mouth at bedtime. 10/22/21   Medina-Vargas, Monina C, NP  omeprazole (PRILOSEC) 20 MG capsule Take 1 capsule (20 mg total) by mouth at bedtime. 10/22/21   Medina-Vargas, Monina C, NP  OXcarbazepine (TRILEPTAL) 150 MG tablet Take 1 tablet (150 mg total) by mouth 2 (two) times daily. 10/22/21   Medina-Vargas, Monina C, NP  oxyCODONE (ROXICODONE) 15 MG immediate release tablet Take 15 mg by mouth 4 (four) times daily as needed. 11/27/21   [provider]  Oxycodone HCl 10 MG TABS Take 1 tablet (10 mg total) by mouth every 6 (six) hours as needed. Patient not taking: Reported on 12/05/2021 10/22/21   Medina-Vargas, Jaymes Graff C, NP    Physical Exam: Vitals:   12/14/21 1532 12/14/21 1759  BP: 110/62 123/69  Pulse: 93 (!) 102  Resp: 15 18  Temp:  98.4 F (36.9 C)   TempSrc: Oral   SpO2: 95% 97%   Constitutional: NAD, calm, comfortable Eyes: EOMI, lids and conjunctivae normal ENMT: Mucous membranes are moist. Posterior pharynx clear of any exudate or lesions.Normal dentition.  Neck: normal, supple, no masses. Respiratory: clear to auscultation bilaterally, no wheezing, no crackles. Normal respiratory effort. No accessory muscle use.  Cardiovascular: Regular rate and rhythm, no murmurs / rubs / gallops. No extremity edema.  Abdomen: no tenderness, no masses palpated. No hepatosplenomegaly. Bowel sounds positive.  Musculoskeletal: S/p right transmetatarsal amputation, right foot wound dressing in place with foot  in boot Skin: Dry gangrene left first toe with left lateral fifth toe infected wound with active bloody drainage Neurologic:Strength 5/5 in all 4.  Psychiatric: Normal judgment and insight. Alert and oriented x 3. Normal mood.               EKG: Personally reviewed. Normal sinus rhythm, rate 96, PVC present.  Rate is faster when compared to prior.  Assessment/Plan Principal Problem:   Gangrene of left foot (HCC) Active Problems:   PAD (peripheral artery disease) (HCC)   AKI (acute kidney injury) ([place])   Type 2 diabetes mellitus with peripheral neuropathy ([place])   Hypertension associated with diabetes ([place])   Depression   Hyperlipidemia associated with type 2 diabetes mellitus ([place])   [name] is a 66 y.o. 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  Assessment and Plan: * Gangrene of left foot (HCC) PAD  S/p Rt transmetatarsal amputation 09/19/2021 S/p  left posterior tibial artery balloon angioplasty and stent to left SFA 12/08/2021 Gangrene left first toe with new left lateral fifth toe wound infection.  Seen by vascular surgery, recommended antibiotics and podiatry evaluation. -Start on IV vancomycin, ceftriaxone, Flagyl -Follow blood cultures -MRI left foot -Continue aspirin and Plavix given recent stenting -Vascular surgery following -Podiatry consult in a.m.  AKI (acute kidney injury) ([place]) Creatinine 1.32 on admission compared to baseline 0.7-0.8. -Continue IV fluid hydration overnight -Hold lisinopril, Jardiance  Type 2 diabetes mellitus with peripheral neuropathy (HCC) Basal insulin Semglee 10 units twice daily and SSI.  Hypertension associated with diabetes ([place]) Continue home Lopressor and amlodipine.  Hold lisinopril with AKI.  Depression Continue home bupropion, Latuda, Trileptal, Zyprexa.  Hyperlipidemia associated with type 2 diabetes mellitus (HCC) Continue atorvastatin.  DVT prophylaxis: heparin injection 5,000 Units Start: 12/14/21 2200 Code Status: Full code, confirmed on admission Family Communication: Significant other at bedside Disposition Plan: From home, dispo pending clinical progress Consults called: Vascular surgery Severity of Illness: The appropriate patient status for this patient is INPATIENT. Inpatient status is judged to be reasonable and necessary in order to provide the required intensity of service to ensure the patient's safety. The patient's presenting symptoms, physical exam findings, and initial radiographic and laboratory data in the context of their chronic comorbidities is felt to place them at high risk for further clinical deterioration. Furthermore, it is not anticipated that the patient will be medically stable for discharge from the hospital within 2 midnights of admission.   * I certify that at the point of admission it is my clinical judgment that the patient will require inpatient  hospital care spanning beyond 2 midnights from the point of admission due to high intensity of service, high risk for further deterioration and high frequency of surveillance required.Zada Finders MD [facility]  If 7PM-7AM, please contact night-coverage [email or web address]  12/14/2021, 8:12 PM

## 2021-12-14 NOTE — Assessment & Plan Note (Signed)
Creatinine 1.32 on admission compared to baseline 0.7-0.8. -Continue IV fluid hydration overnight -Hold lisinopril, Jardiance

## 2021-12-14 NOTE — Hospital Course (Signed)
[name] is a 66 y.o. 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

## 2021-12-14 NOTE — Assessment & Plan Note (Signed)
Continue home Lopressor and amlodipine.  Hold lisinopril with AKI.

## 2021-12-14 NOTE — ED Provider Triage Note (Addendum)
Emergency Medicine Provider Triage Evaluation Note  [name] , a 66 y.o. male  was evaluated in triage.  Diabetic patient complaining of left foot drainage for a while.  He endorses drainage and foul smell from the left great toe.  Has been changing bandages however these have been soaking.  He does have a prior amputation to the right foot.  Patient did have an angiogram at the end of May.  Denies any fever at home.  Review of Systems  Positive: Wound drainage Negative: fever  Physical Exam  BP 110/62 (BP Location: Right Arm)   Pulse 93   Temp 98.4 F (36.9 C) (Oral)   Resp 15   SpO2 95%  Gen:   Awake, no distress   Resp:  Normal effort  MSK:   Moves extremities without difficulty  Other:  2+ DP pulses of the left foot, foul smelling ulcer at the forefoot, toe appears discolored with thick nail present.  Medical Decision Making  Medically screening exam initiated at 3:33 PM.  Appropriate orders placed.  [name] was informed that the remainder of the evaluation will be completed by another provider, this initial triage assessment does not replace that evaluation, and the importance of remaining in the ED until their evaluation is complete.  She here with diabetic foot, high suspicion for osteomyelitis.  MRI has been ordered.  Recent CT angiogram performed by Dr. Unk Lightning.   Janeece Fitting, PA-C 12/14/21 1542    Janeece Fitting, PA-C 12/14/21 1542

## 2021-12-15 ENCOUNTER — Inpatient Hospital Stay (HOSPITAL_COMMUNITY): Payer: Medicare HMO

## 2021-12-15 ENCOUNTER — Telehealth: Payer: Self-pay

## 2021-12-15 DIAGNOSIS — I70202 Unspecified atherosclerosis of native arteries of extremities, left leg: Secondary | ICD-10-CM

## 2021-12-15 DIAGNOSIS — I96 Gangrene, not elsewhere classified: Secondary | ICD-10-CM

## 2021-12-15 DIAGNOSIS — I739 Peripheral vascular disease, unspecified: Secondary | ICD-10-CM | POA: Diagnosis not present

## 2021-12-15 LAB — BASIC METABOLIC PANEL
Anion gap: 12 (ref 5–15)
BUN: 15 mg/dL (ref 8–23)
CO2: 21 mmol/L — ABNORMAL LOW (ref 22–32)
Calcium: 8.2 mg/dL — ABNORMAL LOW (ref 8.9–10.3)
Chloride: 101 mmol/L (ref 98–111)
Creatinine, Ser: 0.96 mg/dL (ref 0.61–1.24)
GFR, Estimated: >60 mL/min (ref >60)
Glucose, Bld: 198 mg/dL — ABNORMAL HIGH (ref 70–99)
Potassium: 4 mmol/L (ref 3.5–5.1)
Sodium: 134 mmol/L — ABNORMAL LOW (ref 135–145)

## 2021-12-15 LAB — GLUCOSE, CAPILLARY: Glucose-Capillary: 199 mg/dL — ABNORMAL HIGH (ref 70–99)

## 2021-12-15 LAB — CBG MONITORING, ED
Glucose-Capillary: 169 mg/dL — ABNORMAL HIGH (ref 70–99)
Glucose-Capillary: 195 mg/dL — ABNORMAL HIGH (ref 70–99)
Glucose-Capillary: 137 mg/dL — ABNORMAL HIGH (ref 70–99)

## 2021-12-15 LAB — CBC
HCT: 32.8 % — ABNORMAL LOW (ref 39.0–52.0)
Hemoglobin: 10.6 g/dL — ABNORMAL LOW (ref 13.0–17.0)
MCH: 25.9 pg — ABNORMAL LOW (ref 26.0–34.0)
MCHC: 32.3 g/dL (ref 30.0–36.0)
MCV: 80 fL (ref 80.0–100.0)
Platelets: 549 10*3/uL — ABNORMAL HIGH (ref 150–400)
RBC: 4.1 MIL/uL — ABNORMAL LOW (ref 4.22–5.81)
RDW: 14.9 % (ref 11.5–15.5)
WBC: 16.8 10*3/uL — ABNORMAL HIGH (ref 4.0–10.5)
nRBC: 0 % (ref 0.0–0.2)

## 2021-12-15 LAB — PREALBUMIN: Prealbumin: 5.8 mg/dL — ABNORMAL LOW (ref 18–38)

## 2021-12-15 MED ORDER — OXYCODONE HCL 5 MG PO TABS
15.0000 mg | ORAL_TABLET | Freq: Four times a day (QID) | ORAL | Status: DC | PRN
  Administered 2021-12-15 – 2021-12-24 (×14): 15 mg via ORAL
  Filled 2021-12-15 (×14): qty 3

## 2021-12-15 MED ORDER — VANCOMYCIN HCL 1500 MG/300ML IV SOLN
1500.0000 mg | INTRAVENOUS | Status: DC
  Administered 2021-12-16 – 2021-12-19 (×4): 1500 mg via INTRAVENOUS
  Filled 2021-12-15 (×4): qty 300

## 2021-12-15 MED ORDER — VITAMIN D 25 MCG (1000 UNIT) PO TABS
1000.0000 [IU] | ORAL_TABLET | Freq: Every day | ORAL | Status: DC
  Administered 2021-12-15 – 2021-12-24 (×9): 1000 [IU] via ORAL
  Filled 2021-12-15 (×9): qty 1

## 2021-12-15 MED ORDER — MECLIZINE HCL 25 MG PO TABS
25.0000 mg | ORAL_TABLET | Freq: Three times a day (TID) | ORAL | Status: DC | PRN
Start: 2021-12-15 — End: 2021-12-24

## 2021-12-15 NOTE — ED Notes (Signed)
Patient transported to MRI 

## 2021-12-15 NOTE — Telephone Encounter (Signed)
Pt's significant other, [name], called complaining that pt was still in ED hallway with no room assignment and was concerned with his well-being and open foot wound. Pt reassured that Dr Donzetta Matters had seen him, started abx, and they would get him moved into a room as soon as possible. She confirmed understanding.

## 2021-12-15 NOTE — Progress Notes (Signed)
Left lower extremity arterial duplex completed. Refer to "CV Proc" under chart review to view preliminary results.  12/15/2021 11:35 AM Kelby Aline., MHA, RVT, RDCS, RDMS

## 2021-12-15 NOTE — ED Notes (Signed)
Pt still at [facility]

## 2021-12-15 NOTE — ED Notes (Signed)
Transported to vascular. 

## 2021-12-15 NOTE — Progress Notes (Signed)
Jenner A Mistry  Code Status: FULL Jaja Switalski Reichenbach is a 66 y.o. male patient admitted from ED awake, alert - oriented X4 - no acute distress noted. VSS -  no c/o shortness of breath, no c/o chest pain. Fall assessment complete, with patient able to verbalize understanding of risk associated with falls, and verbalized understanding to call nursing before up out of bed. Skin clean and dry, dressings changed to wounds on right foot and left diabetic foot ulcers. Scattered abrasions on legs. No evidence of skin break down noted on exam.  ?  Will cont to eval and treat per MD orders.  Melonie Florida, RN  12/15/2021

## 2021-12-15 NOTE — Progress Notes (Signed)
PROGRESS NOTE    [name]  ACZ:[id number] DOB: 1955/07/31 DOA: 12/14/2021 PCP: Harvie Junior, MD    Brief Narrative:  [name] is a 66 y.o. 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  Seen by vascular surgery, recommended antibiotics and podiatry evaluation. -podiatry consult called 6/12 Posey Pronto) -Start on IV vancomycin, ceftriaxone, Flagyl -Follow blood cultures -MRI left foot- Soft tissue ulceration plantar lateral to the fifth metatarsal head contacting the bone where there is mild cortical erosion. Marrow edema throughout the fifth metatarsal head and fifth metatarsal shaft indicating fifth metatarsal head acute osteomyelitis possibly extending proximally into the metatarsal shaft. -Continue aspirin and Plavix given recent stenting -Vascular surgery following   AKI (acute kidney injury) ([place]) Creatinine 1.32 on admission compared to baseline 0.7-0.8. -resolved  Type 2 diabetes mellitus with peripheral neuropathy (HCC) Basal insulin Semglee 10 units twice daily and SSI.  Hypertension associated with diabetes ([place]) Continue home Lopressor and amlodipine.  Hold lisinopril with AKI.  Depression Continue home bupropion, Latuda, Trileptal, Zyprexa.  Hyperlipidemia associated with type 2 diabetes mellitus (HCC) Continue atorvastatin.     DVT  prophylaxis: heparin injection 5,000 Units Start: 12/14/21 2200    Code Status: Full Code Family Communication:   Disposition Plan:  Level of care: Med-Surg Status is: Inpatient Remains inpatient appropriate because: needs procedure    Consultants:  Vascular/podiatry   Subjective: No complaints  Objective: Vitals:   12/14/21 1532 12/14/21 1759 12/15/21 0157 12/15/21 0715  BP: 110/62 123/69 (!) 156/87 (!) 149/74  Pulse: 93 (!) 102 (!) 111 (!) 103  Resp: '15 18 18 17  '$ Temp: 98.4 F (36.9 C)     TempSrc: Oral     SpO2: 95% 97% 97% 99%    Intake/Output Summary (Last 24 hours) at 12/15/2021 0949 Last data filed at 12/15/2021 0913 Gross per 24 hour  Intake 2069.26 ml  Output 300 ml  Net 1769.26 ml   There were no vitals filed for this visit.  Examination:   General: Appearance:    Obese male in no acute distress     Lungs:     respirations unlabored  Heart:    Tachycardic.    MS:   Right foot wrapped   Neurologic:   Awake, alert       Data Reviewed: I have personally reviewed following labs and imaging studies  CBC: Recent Labs  Lab 12/08/21 1336 12/14/21 1540 12/15/21 0448  WBC  --  16.8* 16.8*  NEUTROABS  --  13.0*  --   HGB 12.6* 12.5* 10.6*  HCT 37.0* 40.1 32.8*  MCV  --  82.2 80.0  PLT  --  598* 109*   Basic Metabolic Panel: Recent Labs  Lab 12/08/21 1336 12/14/21 1540 12/15/21 0448  NA 139 134* 134*  K 4.1 4.3 4.0  CL 104 99 101  CO2  --  21* 21*  GLUCOSE 163* 91 198*  BUN 13 19 15  CREATININE 0.80 1.32* 0.96  CALCIUM  --  9.2 8.2*   GFR: Estimated Creatinine Clearance: 86.7 mL/min (by C-G formula based on SCr of 0.96 mg/dL). Liver Function Tests: Recent Labs  Lab 12/14/21 1540  AST 21  ALT 22  ALKPHOS 82  BILITOT 0.6  PROT 8.0  ALBUMIN 3.1*   No results for input(s): "LIPASE", "AMYLASE" in the last 168 hours. No results for input(s): "AMMONIA" in the last 168 hours. Coagulation Profile: No results for input(s): "INR",  "PROTIME" in the last 168 hours. Cardiac Enzymes: No results for input(s): "CKTOTAL", "CKMB", "CKMBINDEX", "TROPONINI" in the last 168 hours. BNP (last 3 results) No results for input(s): "PROBNP" in the last 8760 hours. HbA1C: No results for input(s): "HGBA1C" in the last 72 hours. CBG: Recent Labs  Lab 12/08/21 1309 12/08/21 1756 12/08/21 1827 12/14/21 2141 12/15/21 0721  GLUCAP 150* 60* 87 212* 169*   Lipid Profile: No results for input(s): "CHOL", "HDL", "LDLCALC", "TRIG", "CHOLHDL", "LDLDIRECT" in the last 72 hours. Thyroid Function Tests: No results for input(s): "TSH", "T4TOTAL", "FREET4", "T3FREE", "THYROIDAB" in the last 72 hours. Anemia Panel: No results for input(s): "VITAMINB12", "FOLATE", "FERRITIN", "TIBC", "IRON", "RETICCTPCT" in the last 72 hours. Sepsis Labs: Recent Labs  Lab 12/14/21 1540 12/14/21 1740  LATICACIDVEN 2.8* 1.8    Recent Results (from the past 240 hour(s))  Blood culture (routine x 2)     Status: None (Preliminary result)   Collection Time: 12/14/21  3:40 PM   Specimen: BLOOD RIGHT HAND  Result Value Ref Range Status   Specimen Description BLOOD RIGHT HAND  Final   Special Requests   Final    BOTTLES DRAWN AEROBIC ONLY Blood Culture results may not be optimal due to an inadequate volume of blood received in culture bottles   Culture   Final    NO GROWTH < 24 HOURS Performed at [facility], [place] [address] [place], [place] [address]    Report Status PENDING  Incomplete  Blood culture (routine x 2)     Status: None (Preliminary result)   Collection Time: 12/14/21  6:40 PM   Specimen: BLOOD RIGHT FOREARM  Result Value Ref Range Status   Specimen Description BLOOD RIGHT FOREARM  Final   Special Requests   Final    BOTTLES DRAWN AEROBIC AND ANAEROBIC Blood Culture adequate volume   Culture   Final    NO GROWTH < 12 HOURS Performed at [facility], [place] [address], [place], [place] [address]    Report Status PENDING   Incomplete         Radiology Studies: MR FOOT LEFT WO CONTRAST  Result Date: 12/15/2021 CLINICAL DATA:  History of type 2 diabetes follow-up for left great toe gangrene. Previously on antibiotics which were discontinued 4 days ago. Since then has noticed new wound on lateral left foot draining bloody discharge. Left foot infection. Evaluate for osteomyelitis. EXAM: MRI OF THE LEFT FOOT WITHOUT CONTRAST TECHNIQUE: Multiplanar, multisequence MR imaging of the left forefoot was performed. No intravenous contrast was administered. The technologist reports the patient's leg was jerking and his foot became too painful to complete the exam. Axial T2 fat saturation and axial T1 images and coronal T2 fat saturation and coronal T1 images as well as sagittal STIR images were performed. COMPARISON:  Left foot radiographs 12/14/2021 FINDINGS: Despite efforts by the technologist and patient, motion artifact is present on today's exam and could not be eliminated. This reduces exam sensitivity and specificity. Bones/Joint/Cartilage  There is soft tissue ulceration within the plantar lateral aspect of the forefoot to the level of the fifth metatarsal head contacting the plantar lateral aspect of the metatarsal head. Moderate subcutaneous fat edema in this region. Mild erosion of the plantar lateral aspect of the fifth metatarsal head (axial series 4, image 19) with marrow edema throughout the fifth metatarsal head and majority of the fifth metatarsal shaft concerning for acute osteomyelitis. There is scattered low signal air corresponding to the subcutaneous air seen lateral to the fifth metatarsophalangeal joint and lateral to the fifth metatarsal shaft on prior day radiographs. There is focal ulceration within the distal tip of the great toe soft tissues contacting the distal aspect of the distal metatarsal where there is mild cortical erosion (sagittal series 8 images 7 through 9). There is diffuse decreased T1 increased  T2 bone marrow edema throughout the fifth metatarsal concerning for osteomyelitis. Ligaments The Lisfranc ligament complex appears intact. The plantar plates appear intact. Muscles and Tendons Moderate atrophy of the extensor digitorum brevis muscle. Mild fatty infiltration of the abductor hallucis muscle. Moderate to high-grade edema throughout the plantar and dorsal midfoot musculature. Mild fluid within the extensor digitorum brevis tendon sheaths (axial series 5, images 1-9), tenosynovitis. Soft tissues Moderate dorsal midfoot and forefoot subcutaneous fat edema and swelling. IMPRESSION: 1. Soft tissue ulceration plantar lateral to the fifth metatarsal head contacting the bone where there is mild cortical erosion. Marrow edema throughout the fifth metatarsal head and fifth metatarsal shaft indicating fifth metatarsal head acute osteomyelitis possibly extending proximally into the metatarsal shaft. 2. Focal soft tissue ulceration within the distal tip of the great toe with mild cortical erosion within the distal aspect of the distal phalanx and diffuse marrow edema throughout the distal phalanx indicating acute osteomyelitis. 3. Moderate plantar and dorsal midfoot nonspecific myositis. This may be bland or infected. 4. Mild extensor digitorum brevis tenosynovitis. Electronically Signed   By: [name] M.D.   On: 12/15/2021 08:21   DG Foot Complete Left  Result Date: 12/14/2021 CLINICAL DATA:  Diabetic foot wound, possible gangrene of great toe EXAM: LEFT FOOT - COMPLETE 3+ VIEW COMPARISON:  None Available. FINDINGS: No fracture or dislocation of the left foot. No bony erosion or sclerosis. Subcutaneous soft tissue gas about the left fifth metatarsal and metatarsophalangeal joint, and possibly additionally about the tip of the great toe. Diffuse soft tissue edema about the forefoot. IMPRESSION: 1. Subcutaneous soft tissue gas about the left fifth metatarsal and metatarsophalangeal joint, and possibly  additionally about the tip of the great toe. Findings are concerning for gas-forming infection. 2. No bony erosion or sclerosis to suggest osteomyelitis. MRI is the most sensitive test to detect bone marrow edema and osteomyelitis if clinically suspected. 3.  No fracture or dislocation of the left foot. These results were called by telephone at the time of interpretation on 12/14/2021 at 4:32 pm to [facility], [place], who verbally acknowledged these results. Electronically Signed   By: Delanna Ahmadi M.D.   On: 12/14/2021 16:51        Scheduled Meds:  amLODipine  5 mg Oral q morning   aspirin EC  81 mg Oral Daily   atorvastatin  80 mg Oral QHS   buPROPion  300 mg Oral q morning   clopidogrel  75 mg Oral Daily   fluticasone furoate-vilanterol  1 puff Inhalation Daily   gabapentin  300 mg Oral BID   heparin  5,000 Units Subcutaneous Q8H   insulin aspart  0-6 Units Subcutaneous TID WC   insulin glargine-yfgn  10 Units Subcutaneous BID   lurasidone  40 mg Oral Q supper   metoprolol tartrate  50 mg Oral BID   OLANZapine  2.5 mg Oral QHS   OXcarbazepine  150 mg Oral BID   pantoprazole  40 mg Oral QHS   Continuous Infusions:  cefTRIAXone (ROCEPHIN)  IV Stopped (12/15/21 0037)   metronidazole Stopped (12/15/21 0913)   vancomycin       LOS: 1 day    Time spent: 45 minutes spent on chart review, discussion with nursing staff, consultants, updating family and interview/physical exam; more than 50% of that time was spent in counseling and/or coordination of care.    Geradine Girt, DO [facility] Available via Epic secure chat 7am-7pm After these hours, please refer to coverage provider listed on [email or web address] 12/15/2021, 9:49 AM

## 2021-12-15 NOTE — Consult Note (Signed)
  Subjective:  Patient ID: [name], male    DOB: 05-22-1956,  MRN: [id number]  A 66 y.o. 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  Patient states that the wound progressively got worse and started getting dark and discolored with left hallux gangrene and left fifth metatarsal ulceration.  He has extensive vascular history as well.  He is being followed by vascular surgery.  Objective:   Vitals:   12/15/21 1810 12/15/21 1959  BP: (!) 159/85 (!) 153/68  Pulse: 85 94  Resp: 18 18  Temp: 99.7 F (37.6 C) 99.9 F (37.7 C)  SpO2: 97% 100%   General AA&O x3. Normal mood and affect.  Vascular Dorsalis pedis and posterior tibial pulses nonpalpable Brisk capillary refill to diminished to all digits pedal hair not present.  Neurologic Epicritic sensation grossly intact.  Dermatologic Left hallux gangrene with mild malodor present.  No redness noted.  Appears to be more dry and stable in nature.  Left lateral fifth metatarsal head ulceration with some superficial purulent drainage.  Malodor present.  Probing down to bone consistent with osteomyelitis.  No cellulitis noted.  Duskiness changes noted to forefoot  Orthopedic: MMT 5/5 in dorsiflexion, plantarflexion, inversion, and eversion. Normal joint ROM without pain or crepitus.       IMPRESSION: 1. Soft tissue ulceration plantar lateral to the fifth metatarsal head contacting the bone where there is mild cortical erosion. Marrow edema throughout the fifth metatarsal head and fifth metatarsal shaft indicating fifth metatarsal head acute osteomyelitis possibly extending proximally into the metatarsal shaft. 2.  Focal soft tissue ulceration within the distal tip of the great toe with mild cortical erosion within the distal aspect of the distal phalanx and diffuse marrow edema throughout the distal phalanx indicating acute osteomyelitis. 3. Moderate plantar and dorsal midfoot nonspecific myositis. This may be bland or infected. 4. Mild extensor digitorum brevis tenosynovitis.  Assessment & Plan:  Patient was evaluated and treated and all questions answered.  Left hallux gangrene and left fifth metatarsal head ulceration with underlying osteomyelitis -All questions or concerns were discussed with the patient in extensive detail -Patient has recent history of undergoing endovascular revascularization by vascular surgery.   -Duplex appears to show multilevel vessel disease.  I will await vascular intervention prior to undergoing amputation. -Patient is a high risk for undergoing further amputation versus loss of leg/foot. -Local wound care with Betadine wet-to-dry dressing changes daily -Partial weightbearing to the heel to the left foot. -Patient will need partial hallux amputation as well as partial fifth ray amputation during this admission.  I will take him to the operating room once his vascular flow has been restored.  [name], DPM  Accessible via secure chat for questions or concerns.

## 2021-12-15 NOTE — Consult Note (Signed)
[facility] Nurse Consult Note: Reason for Consult:Previously transmetatarsal head amputation on right (09/19/21), New left great toe and left lateral foot wounds with necrotic changes and pain. Previous interventions by Vascular Surgery.    WOC Nursing is simultaneously consulted with Vascular surgery and Vascular (Dr. Donzetta Matters) saw last evening. See photos of bilateral feet taken during that Encounter in the EHR. Additionally, Dr. Donzetta Matters notes that he will notify the patient's primary surgeon of the patient's admission. At this time, there is no role for [place] other than to provide Nursing with interim dressing change orders.   Dressing procedure/placement/frequency: I will provide dressing change orders for this patient consisting of a NS cleanse, pat dry and covering with saline moistened gauze. This is to be topped with dry gauze and secured with Kerlix roll gauze/paper tape and changed twice daily and PRN drainage strike-through onto exterior of dressing. Heels are to be floated and a sacral foam placed for PI prevention.  [facility] nursing team will not follow, but will remain available to this patient, the nursing and medical teams.  Please re-consult if needed.  Thank you for inviting [place] to participate in this patient's Plan of Care.  Maudie Flakes, MSN, RN, CNS, [place], Serita Grammes, [employer], [employer] phone:  [phone]

## 2021-12-15 NOTE — Progress Notes (Signed)
Pharmacy Antibiotic Note  [name] is a 66 y.o. male admitted on 12/14/2021 with foul smelling left great toe with drainage.  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  Pharmacy has been consulted for vancomycin dosing.  Patient is also on Rocephin and Flagyl. Scr has improved. MRI reported osteomyelitis.   Plan: Change vancomycin to '1500mg'$  IV Q24H F/u renal fxn, C&S, clinical status and peak/trough at SS  Temp (24hrs), Avg:98.4 F (36.9 C), Min:98.4 F (36.9 C), Max:98.4 F (36.9 C)  Recent Labs  Lab 12/08/21 1336 12/14/21 1540 12/14/21 1740 12/15/21 0448  WBC  --  16.8*  --  16.8*  CREATININE 0.80 1.32*  --  0.96  LATICACIDVEN  --  2.8* 1.8  --      Estimated Creatinine Clearance: 86.7 mL/min (by C-G formula based on SCr of 0.96 mg/dL).    Allergies  Allergen Reactions   Gemfibrozil Other (See Comments)    Doesn't know its been awhile   Oxycontin [Oxycodone Hcl] Other (See Comments)    Bad reaction "took me to a place I never wanna go again"   Pork-Derived Products    Simvastatin Other (See Comments)    Says he doesn't know its been awhile    Rocephin 6/11 >> Flagyl 6/11 >> Vanc 6/11 >>   6/11 BCx - NGTD  Salome Arnt, PharmD, BCPS Clinical Pharmacist Please see AMION for all pharmacy numbers 12/15/2021 11:11 AM

## 2021-12-15 NOTE — Progress Notes (Signed)
[facility]  Subjective  - doing OK, pain is an issue in the left LE/peripheral neuropathy   Objective (!) 149/74 (!) 103 98.4 F (36.9 C) (Oral) 17 99%  Intake/Output Summary (Last 24 hours) at 12/15/2021 0831 Last data filed at 12/15/2021 1610 Gross per 24 hour  Intake 1969.26 ml  Output 300 ml  Net 1669.26 ml    Sitting up eating breakfast Lungs non labored breathing Clean dry dressings on B feet Doppler signals PT left LE  Assessment/Planning: PAD with history of previous right lower extremity bypass transmetatarsal amputation that is followed by podiatry and now with left great toe gangrene status post endovascular revascularization with new left fifth toe infection and drainage as pictured above.   Presented with left 5th toe open wound and infection and GT wound Doppler signal PT intact. Pending Duplex to prove open bypass and assist with toe amputation healing. Pending DPM exam and plan   Roxy Horseman 12/15/2021 8:31 AM --  Laboratory Lab Results: Recent Labs    12/14/21 1540 12/15/21 0448  WBC 16.8* 16.8*  HGB 12.5* 10.6*  HCT 40.1 32.8*  PLT 598* 549*   BMET Recent Labs    12/14/21 1540 12/15/21 0448  NA 134* 134*  K 4.3 4.0  CL 99 101  CO2 21* 21*  GLUCOSE 91 198*  BUN 19 15  CREATININE 1.32* 0.96  CALCIUM 9.2 8.2*    COAG No results found for: "INR", "PROTIME" No results found for: "PTT"

## 2021-12-16 ENCOUNTER — Inpatient Hospital Stay (HOSPITAL_COMMUNITY): Payer: Medicare HMO

## 2021-12-16 ENCOUNTER — Ambulatory Visit (HOSPITAL_BASED_OUTPATIENT_CLINIC_OR_DEPARTMENT_OTHER): Payer: Medicare HMO | Admitting: Internal Medicine

## 2021-12-16 DIAGNOSIS — Z89431 Acquired absence of right foot: Secondary | ICD-10-CM

## 2021-12-16 DIAGNOSIS — E1169 Type 2 diabetes mellitus with other specified complication: Secondary | ICD-10-CM

## 2021-12-16 DIAGNOSIS — A419 Sepsis, unspecified organism: Secondary | ICD-10-CM | POA: Insufficient documentation

## 2021-12-16 DIAGNOSIS — I739 Peripheral vascular disease, unspecified: Secondary | ICD-10-CM

## 2021-12-16 DIAGNOSIS — M86142 Other acute osteomyelitis, left hand: Secondary | ICD-10-CM | POA: Diagnosis not present

## 2021-12-16 DIAGNOSIS — N179 Acute kidney failure, unspecified: Secondary | ICD-10-CM

## 2021-12-16 DIAGNOSIS — M869 Osteomyelitis, unspecified: Secondary | ICD-10-CM

## 2021-12-16 DIAGNOSIS — M659 Synovitis and tenosynovitis, unspecified: Secondary | ICD-10-CM

## 2021-12-16 DIAGNOSIS — F32A Depression, unspecified: Secondary | ICD-10-CM

## 2021-12-16 DIAGNOSIS — K219 Gastro-esophageal reflux disease without esophagitis: Secondary | ICD-10-CM

## 2021-12-16 DIAGNOSIS — I152 Hypertension secondary to endocrine disorders: Secondary | ICD-10-CM

## 2021-12-16 DIAGNOSIS — R6520 Severe sepsis without septic shock: Secondary | ICD-10-CM | POA: Insufficient documentation

## 2021-12-16 DIAGNOSIS — I96 Gangrene, not elsewhere classified: Secondary | ICD-10-CM

## 2021-12-16 DIAGNOSIS — E11621 Type 2 diabetes mellitus with foot ulcer: Secondary | ICD-10-CM

## 2021-12-16 DIAGNOSIS — L0390 Cellulitis, unspecified: Secondary | ICD-10-CM | POA: Diagnosis not present

## 2021-12-16 DIAGNOSIS — M86172 Other acute osteomyelitis, left ankle and foot: Secondary | ICD-10-CM | POA: Diagnosis not present

## 2021-12-16 DIAGNOSIS — E1159 Type 2 diabetes mellitus with other circulatory complications: Secondary | ICD-10-CM

## 2021-12-16 DIAGNOSIS — E1165 Type 2 diabetes mellitus with hyperglycemia: Secondary | ICD-10-CM

## 2021-12-16 DIAGNOSIS — Z794 Long term (current) use of insulin: Secondary | ICD-10-CM

## 2021-12-16 DIAGNOSIS — L97509 Non-pressure chronic ulcer of other part of unspecified foot with unspecified severity: Secondary | ICD-10-CM

## 2021-12-16 DIAGNOSIS — E785 Hyperlipidemia, unspecified: Secondary | ICD-10-CM

## 2021-12-16 DIAGNOSIS — D649 Anemia, unspecified: Secondary | ICD-10-CM

## 2021-12-16 LAB — CBC
HCT: 33.5 % — ABNORMAL LOW (ref 39.0–52.0)
Hemoglobin: 10.6 g/dL — ABNORMAL LOW (ref 13.0–17.0)
MCH: 25 pg — ABNORMAL LOW (ref 26.0–34.0)
MCHC: 31.6 g/dL (ref 30.0–36.0)
MCV: 79 fL — ABNORMAL LOW (ref 80.0–100.0)
Platelets: 589 10*3/uL — ABNORMAL HIGH (ref 150–400)
RBC: 4.24 MIL/uL (ref 4.22–5.81)
RDW: 14.9 % (ref 11.5–15.5)
WBC: 14.1 10*3/uL — ABNORMAL HIGH (ref 4.0–10.5)
nRBC: 0 % (ref 0.0–0.2)

## 2021-12-16 LAB — BASIC METABOLIC PANEL
Anion gap: 8 (ref 5–15)
BUN: 11 mg/dL (ref 8–23)
CO2: 22 mmol/L (ref 22–32)
Calcium: 8.3 mg/dL — ABNORMAL LOW (ref 8.9–10.3)
Chloride: 103 mmol/L (ref 98–111)
Creatinine, Ser: 0.78 mg/dL (ref 0.61–1.24)
GFR, Estimated: >60 mL/min (ref >60)
Glucose, Bld: 161 mg/dL — ABNORMAL HIGH (ref 70–99)
Potassium: 4 mmol/L (ref 3.5–5.1)
Sodium: 133 mmol/L — ABNORMAL LOW (ref 135–145)

## 2021-12-16 LAB — GLUCOSE, CAPILLARY
Glucose-Capillary: 122 mg/dL — ABNORMAL HIGH (ref 70–99)
Glucose-Capillary: 202 mg/dL — ABNORMAL HIGH (ref 70–99)
Glucose-Capillary: 133 mg/dL — ABNORMAL HIGH (ref 70–99)
Glucose-Capillary: 149 mg/dL — ABNORMAL HIGH (ref 70–99)

## 2021-12-16 MED ORDER — SODIUM CHLORIDE 0.9 % IV SOLN
2.0000 g | Freq: Three times a day (TID) | INTRAVENOUS | Status: DC
  Administered 2021-12-16 – 2021-12-20 (×13): 2 g via INTRAVENOUS
  Filled 2021-12-16 (×13): qty 12.5

## 2021-12-16 MED ORDER — PROSOURCE PLUS PO LIQD
30.0000 mL | Freq: Every day | ORAL | Status: DC
  Administered 2021-12-16 – 2021-12-22 (×4): 30 mL via ORAL
  Filled 2021-12-16 (×6): qty 30

## 2021-12-16 MED ORDER — ENSURE ENLIVE PO LIQD
237.0000 mL | Freq: Two times a day (BID) | ORAL | Status: DC
  Administered 2021-12-18 – 2021-12-24 (×9): 237 mL via ORAL

## 2021-12-16 MED ORDER — INSULIN ASPART 100 UNIT/ML IJ SOLN
0.0000 [IU] | Freq: Three times a day (TID) | INTRAMUSCULAR | Status: DC
  Administered 2021-12-16: 1 [IU] via SUBCUTANEOUS
  Administered 2021-12-17: 2 [IU] via SUBCUTANEOUS
  Administered 2021-12-17 – 2021-12-18 (×4): 1 [IU] via SUBCUTANEOUS
  Administered 2021-12-18 – 2021-12-19 (×2): 2 [IU] via SUBCUTANEOUS
  Administered 2021-12-19: 3 [IU] via SUBCUTANEOUS
  Administered 2021-12-19: 2 [IU] via SUBCUTANEOUS
  Administered 2021-12-20 (×2): 5 [IU] via SUBCUTANEOUS
  Administered 2021-12-21 (×3): 2 [IU] via SUBCUTANEOUS
  Administered 2021-12-22: 3 [IU] via SUBCUTANEOUS
  Administered 2021-12-22: 2 [IU] via SUBCUTANEOUS
  Administered 2021-12-22: 3 [IU] via SUBCUTANEOUS
  Administered 2021-12-23: 7 [IU] via SUBCUTANEOUS
  Administered 2021-12-23: 5 [IU] via SUBCUTANEOUS
  Administered 2021-12-23: 2 [IU] via SUBCUTANEOUS
  Administered 2021-12-24: 3 [IU] via SUBCUTANEOUS
  Administered 2021-12-24: 7 [IU] via SUBCUTANEOUS
  Filled 2021-12-16: qty 1

## 2021-12-16 MED ORDER — INSULIN ASPART 100 UNIT/ML IJ SOLN
0.0000 [IU] | Freq: Every day | INTRAMUSCULAR | Status: DC
  Administered 2021-12-18 – 2021-12-19 (×2): 2 [IU] via SUBCUTANEOUS
  Administered 2021-12-20: 3 [IU] via SUBCUTANEOUS
  Administered 2021-12-21: 2 [IU] via SUBCUTANEOUS
  Administered 2021-12-22 – 2021-12-23 (×2): 3 [IU] via SUBCUTANEOUS

## 2021-12-16 NOTE — Progress Notes (Signed)
Pharmacy Antibiotic Note  [name] is a 66 y.o. male admitted on 12/14/2021 with diabetic wound infection.  Pharmacy has been consulted for cefepime dosing.  Plan: Cefepime 2gm IV q8h Monitor for cultures, clinical course and deescalation     Temp (24hrs), Avg:99.9 F (37.7 C), Min:99.2 F (37.3 C), Max:100.7 F (38.2 C)  Recent Labs  Lab 12/14/21 1540 12/14/21 1740 12/15/21 0448 12/16/21 0123  WBC 16.8*  --  16.8* 14.1*  CREATININE 1.32*  --  0.96 0.78  LATICACIDVEN 2.8* 1.8  --   --     Estimated Creatinine Clearance: 104 mL/min (by C-G formula based on SCr of 0.78 mg/dL).    Allergies  Allergen Reactions   Gemfibrozil Other (See Comments)    Doesn't know its been awhile   Oxycontin [Oxycodone Hcl] Other (See Comments)    Bad reaction "took me to a place I never wanna go again"   Pork-Derived Products    Simvastatin Other (See Comments)    Says he doesn't know its been awhile    Antimicrobials this admission: 6/11 ceftriaxone >> 6/13 6/12 vancomycin >>  6/13 Cefepime >>  Dose adjustments this admission:   Microbiology results: 6/11 BCx: NGTD   Dione Mccombie A. Levada Dy, PharmD, BCPS, FNKF Clinical Pharmacist [place] Please utilize Amion for appropriate phone number to reach the unit pharmacist ([place])  12/16/2021 12:57 PM

## 2021-12-16 NOTE — Inpatient Diabetes Management (Signed)
Inpatient Diabetes Program Recommendations  AACE/ADA: New Consensus Statement on Inpatient Glycemic Control   Target Ranges:  Prepandial:   less than 140 mg/dL      Peak postprandial:   less than 180 mg/dL (1-2 hours)      Critically ill patients:  140 - 180 mg/dL    Latest Reference Range & Units 12/15/21 07:21 12/15/21 11:48 12/15/21 16:41 12/15/21 19:59 12/16/21 07:32  Glucose-Capillary 70 - 99 mg/dL 169 (H) 195 (H) 137 (H) 199 (H) 122 (H)   Review of Glycemic Control  Diabetes history: DM2 Outpatient Diabetes medications: Jardiance 25 mg daily, 70/30 80 units QAM, 70/30 40 units QHS Current orders for Inpatient glycemic control: Semglee 10 units BID, Novolog 0-6 units TID with meals  NOTE: Noted consult for diabetes coordinator per lower extremity wound order set. Admitted with gangrene of left foot and AKI.  Chart reviewed. Agree with current inpatient orders for glycemic control. Will follow along while inpatient.  Thanks, Barnie Alderman, RN, MSN, [place] Diabetes Coordinator Inpatient Diabetes Program [phone] (Team Pager from 8am to [place])

## 2021-12-16 NOTE — Progress Notes (Addendum)
Initial Nutrition Assessment  DOCUMENTATION CODES:   Not applicable  INTERVENTION:  Provide Ensure Enlive po BID, each supplement provides 350 kcal and 20 grams of protein.  Provide 30 ml Prosource plus po once daily, each supplement provides 100 kcal and 15 grams of protein.   Recommend obtaining new weight to fully assess weight trends.  NUTRITION DIAGNOSIS:   Increased nutrient needs related to wound healing as evidenced by estimated needs.  GOAL:   Patient will meet greater than or equal to 90% of their needs  MONITOR:   PO intake, Supplement acceptance, Labs, Weight trends, Skin, I & O's  REASON FOR ASSESSMENT:   Consult Wound healing  ASSESSMENT:   66 year old M with PMH of IDDM-2 with neuropathy and other complications, PVD s/p right below-knee popliteal artery to PTA bypass and right TMT amputation on 09/19/2021 and s/p left PTA PCI and stent to left SFA on 12/08/2021, COPD, OSA not on CPAP, HTN, HLD, GERD and depression presented with new pain and drainage from left small toe. Pt admitted with gangrenous left foot infection and PAD.  X-ray with subcutaneous soft tissue gas about left fifth metatarsal and metatarsophalangeal joint concerning for gas-forming infection. MRI of left foot without contrast shows soft tissue ulceration and findings suggestive for acute osteomyelitis of fifth metatarsal head, fifth metatarsal shaft, distal phalanx of left great toe and mild extensor digitorum brevis tenosynovitis.    Pt reports good appetite at time of visit with no difficulties. No recent meal completion recorded, however pt reports eating most to all of his meals. RD consulted for wound healing. RD to order nutritional supplements to aid in increased caloric and protein needs. Pt encouraged to eat his food at meals and to drink his supplements. Noted no new weight measurement at admission. Recommend obtaining new weight to fully assess weight trends. Plan for aortogram by vascular  surgery tomorrow.  NUTRITION - FOCUSED PHYSICAL EXAM:  Flowsheet Row Most Recent Value  Orbital Region No depletion  Upper Arm Region No depletion  Thoracic and Lumbar Region No depletion  Buccal Region No depletion  Temple Region No depletion  Clavicle Bone Region No depletion  Clavicle and Acromion Bone Region No depletion  Scapular Bone Region Unable to assess  Dorsal Hand No depletion  Patellar Region No depletion  Anterior Thigh Region No depletion  Posterior Calf Region No depletion  Edema (RD Assessment) Mild  Hair Reviewed  Eyes Reviewed  Mouth Reviewed  Skin Reviewed  Nails Reviewed      Labs and medications reviewed.   Diet Order:   Diet Order             Diet NPO time specified  Diet effective midnight           Diet heart healthy/carb modified Room service appropriate? Yes; Fluid consistency: Thin  Diet effective now                   EDUCATION NEEDS:   Not appropriate for education at this time  Skin:  Skin Assessment: Skin Integrity Issues: Skin Integrity Issues:: Other (Comment) Other: wound R foot, gangrene L foot  Last BM:  Unknown  Height:   Ht Readings from Last 1 Encounters:  12/10/21 '5\' 10"'$  (1.778 m)    Weight:   Wt Readings from Last 1 Encounters:  12/10/21 98.4 kg   BMI:  There is no height or weight on file to calculate BMI.  Estimated Nutritional Needs:   Kcal:  2200-2500  Protein:  115-130 grams  Fluid:  >/= 2 L/day  Corrin Parker, MS, RD, LDN RD pager number/after hours weekend pager number on Amion.

## 2021-12-16 NOTE — Progress Notes (Signed)
ABI completed. Refer to "CV Proc" under chart review to view preliminary results.  12/16/2021 9:32 AM Kelby Aline., MHA, RVT, RDCS, RDMS

## 2021-12-16 NOTE — Progress Notes (Addendum)
  Progress Note    12/16/2021 7:33 AM * No surgery found *  Subjective:  no complaints   Vitals:   12/15/21 1959 12/16/21 0408  BP: (!) 153/68 (!) 148/71  Pulse: 94 94  Resp: 18 19  Temp: 99.9 F (37.7 C) (!) 100.7 F (38.2 C)  SpO2: 100% 97%   Physical Exam: Cardiac:  regular Lungs:  non labored Extremities:  Bilateral feet dressed. 2+ femoral pulses bilaterally. Left PT signal.  Neurologic: alert and oriented  CBC    Component Value Date/Time   WBC 14.1 (H) 12/16/2021 0123   RBC 4.24 12/16/2021 0123   HGB 10.6 (L) 12/16/2021 0123   HCT 33.5 (L) 12/16/2021 0123   PLT 589 (H) 12/16/2021 0123   MCV 79.0 (L) 12/16/2021 0123   MCH 25.0 (L) 12/16/2021 0123   MCHC 31.6 12/16/2021 0123   RDW 14.9 12/16/2021 0123   LYMPHSABS 1.7 12/14/2021 1540   MONOABS 1.9 (H) 12/14/2021 1540   EOSABS 0.1 12/14/2021 1540   BASOSABS 0.1 12/14/2021 1540    BMET    Component Value Date/Time   NA 133 (L) 12/16/2021 0123   NA 135 (A) 10/07/2021 0000   K 4.0 12/16/2021 0123   CL 103 12/16/2021 0123   CO2 22 12/16/2021 0123   GLUCOSE 161 (H) 12/16/2021 0123   BUN 11 12/16/2021 0123   BUN 12 10/07/2021 0000   CREATININE 0.78 12/16/2021 0123   CALCIUM 8.3 (L) 12/16/2021 0123   GFRNONAA >60 12/16/2021 0123   GFRAA >60 02/17/2015 2030    INR No results found for: "INR"   Intake/Output Summary (Last 24 hours) at 12/16/2021 0733 Last data filed at 12/16/2021 0108 Gross per 24 hour  Intake 1349.26 ml  Output 1050 ml  Net 299.26 ml     Assessment/Plan:  66 y.o. male with dry gangrene of left GT and 5th toe ulcer with osteo s/p recent revascularization  Duplex showing some stenosis in SFA, DFA and PT with monophasic flow throughout Pending ABI Febrile Leukocytosis trending back down 14.1 Podiatry recommending partial hallux amputation as well as partial 5th toe amputation Recommend repeat Angiogram to try to optimize him from revascularization standpoint prior to surgical  intervention by Podiatry. Will schedule this for tomorrow 6/14 with Dr. Virl Cagey NPO after Midnight Consent ordered   Karoline Caldwell, [facility] [phone] 12/16/2021 7:33 AM  VASCULAR STAFF ADDENDUM: I have independently interviewed and examined the patient. I agree with the above.  Spoke to Dr. Posey Pronto, [place] for Thursday  I will perform an angiogram tomorrow in an effort to ensure no significant stenosis  J. Melene Muller, MD [facility] Phone Number: [phone] 12/16/2021 2:00 PM

## 2021-12-16 NOTE — Progress Notes (Addendum)
PROGRESS NOTE  [name]:[id number] DOB: 07/12/1955   PCP: Harvie Junior, MD  Patient is from: Home.  Lives with fianc.  Has rolling walker  DOA: 12/14/2021 LOS: 2  Chief complaints Chief Complaint  Patient presents with   Wound Infection     Brief Narrative / Interim history: 66 year old M with PMH of IDDM-2 with neuropathy and other complications, PVD s/p right below-knee popliteal artery to PTA bypass and right TMT amputation on 09/19/2021 and s/p left PTA PCI and stent to left SFA on 12/08/2021, COPD, OSA not on CPAP, HTN, HLD, GERD and depression presented with new pain and drainage from left small toe, subjective fever and decreased appetite.  He was on p.o. Keflex after recent hospitalization, which was discontinued by ID 4 days prior to presentation.  He was admitted with gangrenous left foot infection and PAD.  He was started on IV vancomycin, CTX and Flagyl.  Vascular surgery and podiatry consulted.    X-ray with subcutaneous soft tissue gas about left fifth metatarsal and metatarsophalangeal joint concerning for gas-forming infection.  MRI of left foot without contrast shows soft tissue ulceration and findings suggestive for acute osteomyelitis of fifth metatarsal head, fifth metatarsal shaft, distal phalanx of left great toe and mild extensor digitorum brevis tenosynovitis.   LE Doppler concerning for some stenosis in SFA, DFA and PT with monophasic flow throughout.  ABI pending.  Plan for aortogram on 6/14.  Subjective: Seen and examined earlier this morning.  No major events overnight of this morning.  He had a fever to 100.7 about 4 AM.  He reports pain in his left leg.  He rates his pain 5/10.  He attributes his pain to neuropathy and ulcer.  He says he is confused about "everything".  He denies chest pain, dyspnea, nausea, vomiting or abdominal pain.  He denies UTI symptoms.  Objective: Vitals:   12/15/21 1959 12/16/21 0408 12/16/21 0738 12/16/21 1343  BP:  (!) 153/68 (!) 148/71 133/67 125/63  Pulse: 94 94 93 74  Resp: 18 19 17 17   Temp: 99.9 F (37.7 C) (!) 100.7 F (38.2 C) 100.2 F (37.9 C) 99.1 F (37.3 C)  TempSrc: Oral  Oral Oral  SpO2: 100% 97% 95% (!) 89%    Examination:  GENERAL: No apparent distress.  Nontoxic. HEENT: MMM.  Vision and hearing grossly intact.  NECK: Supple.  No apparent JVD.  RESP:  No IWOB.  Fair aeration bilaterally. CVS:  RRR. Heart sounds normal.  ABD/GI/GU: BS+. Abd soft, NTND.  MSK/EXT:  Moves extremities. S/p right TMT amputation.  Ulcer at the base of left fifth toe laterally with some purulent drainage.  Left hallux gangrene with erythema or drainage. SKIN: no apparent skin lesion or wound NEURO: Awake, alert and oriented appropriately.  No apparent focal neuro deficit. PSYCH: Calm. Normal affect.   Procedures:  None  Microbiology summarized: Blood cultures NGTD.  Assessment and plan: Principal Problem:   Gangrene of left foot (HCC) Active Problems:   Acute osteomyelitis of phalanx of foot, left (HCC)   Acute osteomyelitis of metacarpal bone, left (HCC)   Diabetic foot ulcer with osteomyelitis (HCC)   Severe sepsis (HCC)   Tenosynovitis of left foot   PAD (peripheral artery disease) (HCC)   AKI (acute kidney injury) ([place])   Type 2 diabetes mellitus ([place])   Hypertension associated with diabetes ([place])   Depression   GERD without esophagitis   Hyperlipidemia associated with type 2 diabetes mellitus ([place])   S/P  transmetatarsal amputation of foot, right (HCC)   Normocytic anemia  Severe sepsis: POA.  Had tachycardia, leukocytosis, lactic acidosis and AKI.  Now spiking fever. Acute osteomyelitis of left foot  Diabetic foot ulcer with osteomyelitis in patient with uncontrolled IDDM-2 Tenosynovitis of left foot Left great toe gangrenous change Bilateral peripheral artery disease -X-ray, MRI and LE Doppler as above. -Blood cultures NGTD. -Continue vancomycin and Flagyl -Change  ceftriaxone to cefepime for pseudomonal coverage pending surgery -Check CRP and ESR -Follow ABI -Plan for aortogram by vascular surgery on 6/14 -Continue Lipitor, Plavix and aspirin -Podiatry and vascular surgery following -Pain control with bowel regimen  Uncontrolled IDDM-2 with neuropathy, PAD, ulcer infection with osteomyelitis and hyperglycemia A1c 7.6% on 09/13/2021. Recent Labs  Lab 12/15/21 1641 12/15/21 1959 12/16/21 0732 12/16/21 1115 12/16/21 1555  GLUCAP 137* 199* 122* 202* 133*  -Recheck hemoglobin A1c -Increase SSI to sensitive and add night coverage -Continue Semglee 10 units twice daily -Continue Lipitor 80 mg daily -Continue gabapentin.   AKI: Resolved. Recent Labs    09/16/21 0140 09/18/21 0152 09/22/21 0411 10/07/21 0000 11/05/21 1223 12/03/21 1244 12/08/21 1336 12/14/21 1540 12/15/21 0448 12/16/21 0123  BUN 16 13 6* 12 19 9 13 19 15 11   CREATININE 1.08 0.77 0.70 0.7 0.98 0.80 0.80 1.32* 0.96 0.78  -Monitor   Hypertension associated with diabetes: BP within acceptable range. -Continue home amlodipine and Lopressor. -Continue holding lisinopril   Depression/mood disorder: Stable. -Continue home bupropion, Latuda, Trileptal, Zyprexa.   Hyperlipidemia associated with type 2 diabetes mellitus (HCC) -Continue atorvastatin.  OSA not on CPAP  Obesity: BMI 31.14. -Encourage lifestyle change to lose weight -May benefit from GLP-1 agonist.  GERD: -Continue PPI  DVT prophylaxis:  heparin injection 5,000 Units Start: 12/14/21 2200  Code Status: Full code Family Communication: None at bedside Level of care: Med-Surg Status is: Inpatient Remains inpatient appropriate because: Due to diabetic foot ulcer infection with osteomyelitis requiring IV antibiotics, further evaluation and surgical intervention   Final disposition: TBD Consultants:  Vascular surgery Podiatry  Sch Meds:  Scheduled Meds:  (feeding supplement) PROSource Plus  30 mL  Oral Daily   amLODipine  5 mg Oral q morning   aspirin EC  81 mg Oral Daily   atorvastatin  80 mg Oral QHS   buPROPion  300 mg Oral q morning   cholecalciferol  1,000 Units Oral Daily   clopidogrel  75 mg Oral Daily   [START ON 12/17/2021] feeding supplement  237 mL Oral BID BM   fluticasone furoate-vilanterol  1 puff Inhalation Daily   gabapentin  300 mg Oral BID   heparin  5,000 Units Subcutaneous Q8H   insulin aspart  0-5 Units Subcutaneous QHS   insulin aspart  0-9 Units Subcutaneous TID WC   insulin glargine-yfgn  10 Units Subcutaneous BID   lurasidone  40 mg Oral Q supper   metoprolol tartrate  50 mg Oral BID   OLANZapine  2.5 mg Oral QHS   OXcarbazepine  150 mg Oral BID   pantoprazole  40 mg Oral QHS   Continuous Infusions:  ceFEPime (MAXIPIME) IV 2 g (12/16/21 1435)   metronidazole 500 mg (12/16/21 0754)   vancomycin 1,500 mg (12/16/21 0326)   PRN Meds:.acetaminophen **OR** acetaminophen, albuterol, HYDROcodone-acetaminophen, meclizine, ondansetron **OR** ondansetron (ZOFRAN) IV, oxyCODONE, senna-docusate  Antimicrobials: Anti-infectives (From admission, onward)    Start     Dose/Rate Route Frequency Ordered Stop   12/16/21 1400  ceFEPIme (MAXIPIME) 2 g in sodium chloride 0.9 % 100  mL IVPB        2 g 200 mL/hr over 30 Minutes Intravenous Every 8 hours 12/16/21 1259     12/16/21 0200  vancomycin (VANCOREADY) IVPB 1500 mg/300 mL        1,500 mg 150 mL/hr over 120 Minutes Intravenous Every 24 hours 12/15/21 1108     12/15/21 2200  vancomycin (VANCOREADY) IVPB 1250 mg/250 mL  Status:  Discontinued        1,250 mg 166.7 mL/hr over 90 Minutes Intravenous Every 24 hours 12/14/21 2022 12/15/21 1108   12/14/21 2100  metroNIDAZOLE (FLAGYL) IVPB 500 mg        500 mg 100 mL/hr over 60 Minutes Intravenous Every 12 hours 12/14/21 2022     12/14/21 2030  vancomycin (VANCOREADY) IVPB 2000 mg/400 mL        2,000 mg 200 mL/hr over 120 Minutes Intravenous  Once 12/14/21 2015 12/15/21  0516   12/14/21 2000  cefTRIAXone (ROCEPHIN) 2 g in sodium chloride 0.9 % 100 mL IVPB  Status:  Discontinued        2 g 200 mL/hr over 30 Minutes Intravenous Every 24 hours 12/14/21 1948 12/16/21 1242   12/14/21 2000  metroNIDAZOLE (FLAGYL) IVPB 500 mg  Status:  Discontinued        500 mg 100 mL/hr over 60 Minutes Intravenous Every 8 hours 12/14/21 1948 12/14/21 2022        I have personally reviewed the following labs and images: CBC: Recent Labs  Lab 12/14/21 1540 12/15/21 0448 12/16/21 0123  WBC 16.8* 16.8* 14.1*  NEUTROABS 13.0*  --   --   HGB 12.5* 10.6* 10.6*  HCT 40.1 32.8* 33.5*  MCV 82.2 80.0 79.0*  PLT 598* 549* 589*   BMP &GFR Recent Labs  Lab 12/14/21 1540 12/15/21 0448 12/16/21 0123  NA 134* 134* 133*  K 4.3 4.0 4.0  CL 99 101 103  CO2 21* 21* 22  GLUCOSE 91 198* 161*  BUN 19 15 11   CREATININE 1.32* 0.96 0.78  CALCIUM 9.2 8.2* 8.3*   Estimated Creatinine Clearance: 104 mL/min (by C-G formula based on SCr of 0.78 mg/dL). Liver & Pancreas: Recent Labs  Lab 12/14/21 1540  AST 21  ALT 22  ALKPHOS 82  BILITOT 0.6  PROT 8.0  ALBUMIN 3.1*   No results for input(s): "LIPASE", "AMYLASE" in the last 168 hours. No results for input(s): "AMMONIA" in the last 168 hours. Diabetic: No results for input(s): "HGBA1C" in the last 72 hours. Recent Labs  Lab 12/15/21 1641 12/15/21 1959 12/16/21 0732 12/16/21 1115 12/16/21 1555  GLUCAP 137* 199* 122* 202* 133*   Cardiac Enzymes: No results for input(s): "CKTOTAL", "CKMB", "CKMBINDEX", "TROPONINI" in the last 168 hours. No results for input(s): "PROBNP" in the last 8760 hours. Coagulation Profile: No results for input(s): "INR", "PROTIME" in the last 168 hours. Thyroid Function Tests: No results for input(s): "TSH", "T4TOTAL", "FREET4", "T3FREE", "THYROIDAB" in the last 72 hours. Lipid Profile: No results for input(s): "CHOL", "HDL", "LDLCALC", "TRIG", "CHOLHDL", "LDLDIRECT" in the last 72  hours. Anemia Panel: No results for input(s): "VITAMINB12", "FOLATE", "FERRITIN", "TIBC", "IRON", "RETICCTPCT" in the last 72 hours. Urine analysis:    Component Value Date/Time   COLORURINE YELLOW 11/05/2021 1224   APPEARANCEUR CLEAR 11/05/2021 1224   LABSPEC 1.031 (H) 11/05/2021 1224   PHURINE 5.0 11/05/2021 1224   GLUCOSEU >=500 (A) 11/05/2021 1224   HGBUR NEGATIVE 11/05/2021 [place] 11/05/2021 [place] 11/05/2021  [place] 11/05/2021 1224   UROBILINOGEN 2.0 (H) 02/17/2015 2051   NITRITE NEGATIVE 11/05/2021 1224   LEUKOCYTESUR NEGATIVE 11/05/2021 1224   Sepsis Labs: Invalid input(s): "PROCALCITONIN", "LACTICIDVEN"  Microbiology: Recent Results (from the past 240 hour(s))  Blood culture (routine x 2)     Status: None (Preliminary result)   Collection Time: 12/14/21  3:40 PM   Specimen: BLOOD RIGHT HAND  Result Value Ref Range Status   Specimen Description BLOOD RIGHT HAND  Final   Special Requests   Final    BOTTLES DRAWN AEROBIC ONLY Blood Culture results may not be optimal due to an inadequate volume of blood received in culture bottles   Culture   Final    NO GROWTH 2 DAYS Performed at [facility], [place] [address] [place], [place] [address]    Report Status PENDING  Incomplete  Blood culture (routine x 2)     Status: None (Preliminary result)   Collection Time: 12/14/21  6:40 PM   Specimen: BLOOD RIGHT FOREARM  Result Value Ref Range Status   Specimen Description BLOOD RIGHT FOREARM  Final   Special Requests   Final    BOTTLES DRAWN AEROBIC AND ANAEROBIC Blood Culture adequate volume   Culture   Final    NO GROWTH 2 DAYS Performed at [facility], [place] [address], [place],  [address]    Report Status PENDING  Incomplete    Radiology Studies: VAS [place] ABI WITH/WO TBI  Result Date: 12/16/2021  LOWER EXTREMITY DOPPLER STUDY Patient Name:  [name]  Date of Exam:   12/16/2021 Medical  Rec #: [id number]          Accession #:    [id number] Date of Birth: [birth year]           Patient Gender: M Patient Age:   53 years Exam Location:  [facility] Procedure:      VAS [place] ABI WITH/WO TBI Referring Phys: JOSHUA ROBINS --------------------------------------------------------------------------------  Indications: Ulceration, gangrene, and peripheral artery disease. High Risk         Hypertension, hyperlipidemia, Diabetes, past history of Factors:          smoking.  Comparison Study: 11/14/21 ABI/TBI- right=0.7/amp, left=0.3/gangrene Performing Technologist: Maudry Mayhew MHA, RVT, RDCS, RDMS  Examination Guidelines: A complete evaluation includes at minimum, Doppler waveform signals and systolic blood pressure reading at the level of bilateral brachial, anterior tibial, and posterior tibial arteries, when vessel segments are accessible. Bilateral testing is considered an integral part of a complete examination. Photoelectric Plethysmograph (PPG) waveforms and toe systolic pressure readings are included as required and additional duplex testing as needed. Limited examinations for reoccurring indications may be performed as noted.  ABI Findings: +---------+------------------+-----+----------+----------+ Right    Rt Pressure (mmHg)IndexWaveform  Comment    +---------+------------------+-----+----------+----------+ Brachial 148                    triphasic            +---------+------------------+-----+----------+----------+ PTA      117               0.79 monophasic           +---------+------------------+-----+----------+----------+ DP       90                0.61 monophasic           +---------+------------------+-----+----------+----------+ Great Toe  amputation +---------+------------------+-----+----------+----------+ +---------+------------------+-----+----------+--------+ Left     Lt Pressure (mmHg)IndexWaveform  Comment   +---------+------------------+-----+----------+--------+ Brachial 140                    triphasic          +---------+------------------+-----+----------+--------+ PTA      118               0.80 monophasic         +---------+------------------+-----+----------+--------+ DP       88                0.59 monophasic         +---------+------------------+-----+----------+--------+ Great Toe                                 gangrene +---------+------------------+-----+----------+--------+ +-------+-----------+-----------+------------+------------+ ABI/TBIToday's ABIToday's TBIPrevious ABIPrevious TBI +-------+-----------+-----------+------------+------------+ Right  0.79       0.00       0.7         amp          +-------+-----------+-----------+------------+------------+ Left   0.80       0.00       0.3         gangrene     +-------+-----------+-----------+------------+------------+  Bilateral ABIs appear increased compared to prior study on 11/14/2021.  Summary: Right: Resting right ankle-brachial index indicates moderate right lower extremity arterial disease. Left: Resting left ankle-brachial index indicates mild left lower extremity arterial disease. *See table(s) above for measurements and observations.  Electronically signed by Deitra Mayo MD on 12/16/2021 at 3:07:48 PM.    Final       Korene Dula T. Piney  If 7PM-7AM, please contact night-coverage [email or web address] 12/16/2021, 5:23 PM

## 2021-12-16 NOTE — Plan of Care (Signed)
No acute events overnight    Problem: Education: Goal: Understanding of CV disease, CV risk reduction, and recovery process will improve Outcome: Progressing Goal: Individualized Educational Video(s) Outcome: Progressing   Problem: Activity: Goal: Ability to return to baseline activity level will improve Outcome: Progressing   Problem: Cardiovascular: Goal: Ability to achieve and maintain adequate cardiovascular perfusion will improve Outcome: Progressing Goal: Vascular access site(s) Level 0-1 will be maintained Outcome: Progressing   Problem: Health Behavior/Discharge Planning: Goal: Ability to safely manage health-related needs after discharge will improve Outcome: Progressing   Problem: Education: Goal: Ability to describe self-care measures that may prevent or decrease complications (Diabetes Survival Skills Education) will improve Outcome: Progressing Goal: Individualized Educational Video(s) Outcome: Progressing   Problem: Coping: Goal: Ability to adjust to condition or change in health will improve Outcome: Progressing   Problem: Fluid Volume: Goal: Ability to maintain a balanced intake and output will improve Outcome: Progressing   Problem: Health Behavior/Discharge Planning: Goal: Ability to identify and utilize available resources and services will improve Outcome: Progressing Goal: Ability to manage health-related needs will improve Outcome: Progressing   Problem: Metabolic: Goal: Ability to maintain appropriate glucose levels will improve Outcome: Progressing   Problem: Nutritional: Goal: Maintenance of adequate nutrition will improve Outcome: Progressing Goal: Progress toward achieving an optimal weight will improve Outcome: Progressing   Problem: Skin Integrity: Goal: Risk for impaired skin integrity will decrease Outcome: Progressing   Problem: Tissue Perfusion: Goal: Adequacy of tissue perfusion will improve Outcome: Progressing    Problem: Education: Goal: Knowledge of General Education information will improve Description: Including pain rating scale, medication(s)/side effects and non-pharmacologic comfort measures Outcome: Progressing   Problem: Health Behavior/Discharge Planning: Goal: Ability to manage health-related needs will improve Outcome: Progressing   Problem: Clinical Measurements: Goal: Ability to maintain clinical measurements within normal limits will improve Outcome: Progressing Goal: Will remain free from infection Outcome: Progressing Goal: Diagnostic test results will improve Outcome: Progressing Goal: Respiratory complications will improve Outcome: Progressing Goal: Cardiovascular complication will be avoided Outcome: Progressing   Problem: Activity: Goal: Risk for activity intolerance will decrease Outcome: Progressing   Problem: Nutrition: Goal: Adequate nutrition will be maintained Outcome: Progressing   Problem: Coping: Goal: Level of anxiety will decrease Outcome: Progressing   Problem: Elimination: Goal: Will not experience complications related to bowel motility Outcome: Progressing Goal: Will not experience complications related to urinary retention Outcome: Progressing   Problem: Pain Managment: Goal: General experience of comfort will improve Outcome: Progressing   Problem: Safety: Goal: Ability to remain free from injury will improve Outcome: Progressing   Problem: Skin Integrity: Goal: Risk for impaired skin integrity will decrease Outcome: Progressing

## 2021-12-17 ENCOUNTER — Encounter (HOSPITAL_COMMUNITY)
Admission: EM | Disposition: A | Discharge status: Skilled Nursing Facility | Payer: Self-pay | Source: Home / Self Care | Attending: Student

## 2021-12-17 ENCOUNTER — Encounter: Payer: Medicare HMO | Admitting: Podiatry

## 2021-12-17 DIAGNOSIS — D75839 Thrombocytosis, unspecified: Secondary | ICD-10-CM

## 2021-12-17 DIAGNOSIS — R6520 Severe sepsis without septic shock: Secondary | ICD-10-CM

## 2021-12-17 DIAGNOSIS — D649 Anemia, unspecified: Secondary | ICD-10-CM

## 2021-12-17 DIAGNOSIS — I70222 Atherosclerosis of native arteries of extremities with rest pain, left leg: Secondary | ICD-10-CM

## 2021-12-17 DIAGNOSIS — I96 Gangrene, not elsewhere classified: Secondary | ICD-10-CM

## 2021-12-17 DIAGNOSIS — Z95828 Presence of other vascular implants and grafts: Secondary | ICD-10-CM

## 2021-12-17 DIAGNOSIS — D72825 Bandemia: Secondary | ICD-10-CM

## 2021-12-17 DIAGNOSIS — M86172 Other acute osteomyelitis, left ankle and foot: Secondary | ICD-10-CM | POA: Diagnosis not present

## 2021-12-17 DIAGNOSIS — I998 Other disorder of circulatory system: Secondary | ICD-10-CM | POA: Diagnosis not present

## 2021-12-17 DIAGNOSIS — M86142 Other acute osteomyelitis, left hand: Secondary | ICD-10-CM | POA: Diagnosis not present

## 2021-12-17 DIAGNOSIS — E11621 Type 2 diabetes mellitus with foot ulcer: Secondary | ICD-10-CM | POA: Diagnosis not present

## 2021-12-17 HISTORY — PX: ABDOMINAL AORTOGRAM W/LOWER EXTREMITY: CATH118223

## 2021-12-17 LAB — CBC
HCT: 32 % — ABNORMAL LOW (ref 39.0–52.0)
HCT: 36.3 % — ABNORMAL LOW (ref 39.0–52.0)
Hemoglobin: 10.8 g/dL — ABNORMAL LOW (ref 13.0–17.0)
Hemoglobin: 12 g/dL — ABNORMAL LOW (ref 13.0–17.0)
MCH: 26.1 pg (ref 26.0–34.0)
MCH: 25.4 pg — ABNORMAL LOW (ref 26.0–34.0)
MCHC: 33.8 g/dL (ref 30.0–36.0)
MCHC: 33.1 g/dL (ref 30.0–36.0)
MCV: 77.3 fL — ABNORMAL LOW (ref 80.0–100.0)
MCV: 76.9 fL — ABNORMAL LOW (ref 80.0–100.0)
Platelets: 631 10*3/uL — ABNORMAL HIGH (ref 150–400)
Platelets: 551 10*3/uL — ABNORMAL HIGH (ref 150–400)
RBC: 4.14 MIL/uL — ABNORMAL LOW (ref 4.22–5.81)
RBC: 4.72 MIL/uL (ref 4.22–5.81)
RDW: 14.8 % (ref 11.5–15.5)
RDW: 14.9 % (ref 11.5–15.5)
WBC: 13.6 10*3/uL — ABNORMAL HIGH (ref 4.0–10.5)
WBC: 14.1 10*3/uL — ABNORMAL HIGH (ref 4.0–10.5)
nRBC: 0 % (ref 0.0–0.2)
nRBC: 0 % (ref 0.0–0.2)

## 2021-12-17 LAB — RENAL FUNCTION PANEL
Albumin: 2.4 g/dL — ABNORMAL LOW (ref 3.5–5.0)
Anion gap: 13 (ref 5–15)
BUN: 12 mg/dL (ref 8–23)
CO2: 22 mmol/L (ref 22–32)
Calcium: 8.7 mg/dL — ABNORMAL LOW (ref 8.9–10.3)
Chloride: 99 mmol/L (ref 98–111)
Creatinine, Ser: 0.82 mg/dL (ref 0.61–1.24)
GFR, Estimated: >60 mL/min (ref >60)
Glucose, Bld: 146 mg/dL — ABNORMAL HIGH (ref 70–99)
Phosphorus: 2.7 mg/dL (ref 2.5–4.6)
Potassium: 4 mmol/L (ref 3.5–5.1)
Sodium: 134 mmol/L — ABNORMAL LOW (ref 135–145)

## 2021-12-17 LAB — SEDIMENTATION RATE: Sed Rate: 139 mm/hr — ABNORMAL HIGH (ref 0–16)

## 2021-12-17 LAB — GLUCOSE, CAPILLARY
Glucose-Capillary: 189 mg/dL — ABNORMAL HIGH (ref 70–99)
Glucose-Capillary: 138 mg/dL — ABNORMAL HIGH (ref 70–99)
Glucose-Capillary: 135 mg/dL — ABNORMAL HIGH (ref 70–99)
Glucose-Capillary: 98 mg/dL (ref 70–99)
Glucose-Capillary: 156 mg/dL — ABNORMAL HIGH (ref 70–99)

## 2021-12-17 LAB — LIPID PANEL
Cholesterol: 90 mg/dL (ref 0–200)
HDL: 16 mg/dL — ABNORMAL LOW (ref >40)
LDL Cholesterol: 55 mg/dL (ref 0–99)
Total CHOL/HDL Ratio: 5.6 RATIO
Triglycerides: 94 mg/dL (ref <150)
VLDL: 19 mg/dL (ref 0–40)

## 2021-12-17 LAB — C-REACTIVE PROTEIN: CRP: 26.4 mg/dL — ABNORMAL HIGH (ref <1.0)

## 2021-12-17 LAB — HEMOGLOBIN A1C
Hgb A1c MFr Bld: 8 % — ABNORMAL HIGH (ref 4.8–5.6)
Mean Plasma Glucose: 182.9 mg/dL

## 2021-12-17 LAB — MAGNESIUM: Magnesium: 1.9 mg/dL (ref 1.7–2.4)

## 2021-12-17 SURGERY — ABDOMINAL AORTOGRAM W/LOWER EXTREMITY
Anesthesia: LOCAL

## 2021-12-17 MED ORDER — HEPARIN (PORCINE) IN NACL 1000-0.9 UT/500ML-% IV SOLN
INTRAVENOUS | Status: DC | PRN
  Administered 2021-12-17 (×2): 500 mL

## 2021-12-17 MED ORDER — LABETALOL HCL 5 MG/ML IV SOLN: 10.0000 mg | INTRAVENOUS | Status: DC | PRN

## 2021-12-17 MED ORDER — IODIXANOL 320 MG/ML IV SOLN
INTRAVENOUS | Status: DC | PRN
  Administered 2021-12-17: 80 mL via INTRA_ARTERIAL

## 2021-12-17 MED ORDER — GABAPENTIN 300 MG PO CAPS
300.0000 mg | ORAL_CAPSULE | ORAL | Status: DC
Start: 2021-12-18 — End: 2021-12-24
  Administered 2021-12-18 – 2021-12-24 (×13): 300 mg via ORAL
  Filled 2021-12-17 (×13): qty 1

## 2021-12-17 MED ORDER — GABAPENTIN 300 MG PO CAPS
600.0000 mg | ORAL_CAPSULE | Freq: Every day | ORAL | Status: DC
  Administered 2021-12-17 – 2021-12-23 (×7): 600 mg via ORAL
  Filled 2021-12-17 (×7): qty 2

## 2021-12-17 MED ORDER — FENTANYL CITRATE (PF) 100 MCG/2ML IJ SOLN
INTRAMUSCULAR | Status: AC
  Filled 2021-12-17: qty 2

## 2021-12-17 MED ORDER — HEPARIN (PORCINE) IN NACL 1000-0.9 UT/500ML-% IV SOLN
INTRAVENOUS | Status: AC
Start: 2021-12-17 — End: ?
  Filled 2021-12-17: qty 500

## 2021-12-17 MED ORDER — MIDAZOLAM HCL 2 MG/2ML IJ SOLN
INTRAMUSCULAR | Status: DC | PRN
  Administered 2021-12-17: 1 mg via INTRAVENOUS

## 2021-12-17 MED ORDER — LIDOCAINE HCL (PF) 1 % IJ SOLN
INTRAMUSCULAR | Status: DC | PRN
  Administered 2021-12-17: 15 mL via INTRADERMAL

## 2021-12-17 MED ORDER — SODIUM CHLORIDE 0.9% FLUSH
3.0000 mL | Freq: Two times a day (BID) | INTRAVENOUS | Status: DC
  Administered 2021-12-19 – 2021-12-24 (×7): 3 mL via INTRAVENOUS

## 2021-12-17 MED ORDER — SODIUM CHLORIDE 0.9 % IV SOLN: INTRAVENOUS | Status: DC

## 2021-12-17 MED ORDER — HYDRALAZINE HCL 20 MG/ML IJ SOLN: 5.0000 mg | INTRAMUSCULAR | Status: DC | PRN

## 2021-12-17 MED ORDER — MIDAZOLAM HCL 2 MG/2ML IJ SOLN
INTRAMUSCULAR | Status: AC
  Filled 2021-12-17: qty 2

## 2021-12-17 MED ORDER — FENTANYL CITRATE (PF) 100 MCG/2ML IJ SOLN
INTRAMUSCULAR | Status: DC | PRN
  Administered 2021-12-17: 50 ug via INTRAVENOUS

## 2021-12-17 MED ORDER — HEPARIN (PORCINE) IN NACL 1000-0.9 UT/500ML-% IV SOLN
INTRAVENOUS | Status: AC
  Filled 2021-12-17: qty 500

## 2021-12-17 MED ORDER — SODIUM CHLORIDE 0.9 % IV SOLN: 250.0000 mL | INTRAVENOUS | Status: DC | PRN

## 2021-12-17 MED ORDER — SODIUM CHLORIDE 0.9 % WEIGHT BASED INFUSION: 1.0000 mL/kg/h | INTRAVENOUS | Status: AC

## 2021-12-17 MED ORDER — ONDANSETRON HCL 4 MG/2ML IJ SOLN: 4.0000 mg | Freq: Four times a day (QID) | INTRAMUSCULAR | Status: DC | PRN

## 2021-12-17 MED ORDER — LIDOCAINE HCL (PF) 1 % IJ SOLN
INTRAMUSCULAR | Status: AC
  Filled 2021-12-17: qty 30

## 2021-12-17 MED ORDER — SODIUM CHLORIDE 0.9% FLUSH: 3.0000 mL | INTRAVENOUS | Status: DC | PRN

## 2021-12-17 SURGICAL SUPPLY — 11 items
CATH OMNI FLUSH 5F 65CM (CATHETERS) ×1 IMPLANT
DEVICE CLOSURE MYNXGRIP 5F (Vascular Products) ×1 IMPLANT
GLIDEWIRE ADV .035X260CM (WIRE) ×1 IMPLANT
KIT MICROPUNCTURE NIT STIFF (SHEATH) ×1 IMPLANT
KIT PV (KITS) ×2 IMPLANT
SHEATH PINNACLE 5F 10CM (SHEATH) ×1 IMPLANT
SHEATH PROBE COVER 6X72 (BAG) ×1 IMPLANT
SYR MEDRAD MARK V 150ML (SYRINGE) ×1 IMPLANT
TRANSDUCER W/STOPCOCK (MISCELLANEOUS) ×2 IMPLANT
TRAY PV CATH (CUSTOM PROCEDURE TRAY) ×2 IMPLANT
WIRE BENTSON .035X145CM (WIRE) ×1 IMPLANT

## 2021-12-17 NOTE — Progress Notes (Signed)
PROGRESS NOTE  [name] EXB:[id number] DOB: 23-Sep-1955   PCP: Harvie Junior, MD  Patient is from: Home.  Lives with fianc.  Has rolling walker  DOA: 12/14/2021 LOS: 3  Chief complaints Chief Complaint  Patient presents with   Wound Infection     Brief Narrative / Interim history: 66 year old M with PMH of IDDM-2 with neuropathy and other complications, PVD s/p right below-knee popliteal artery to PTA bypass and right TMT amputation on 09/19/2021 and s/p left PTA PCI and stent to left SFA on 12/08/2021, COPD, OSA not on CPAP, HTN, HLD, GERD and depression presented with new pain and drainage from left small toe, subjective fever and decreased appetite.  He was on p.o. Keflex after recent hospitalization, which was discontinued by ID 4 days prior to presentation.  He was admitted with gangrenous left foot infection and PAD.  He was started on IV vancomycin, CTX and Flagyl.  Vascular surgery and podiatry consulted.    X-ray with subcutaneous soft tissue gas about left fifth metatarsal and metatarsophalangeal joint concerning for gas-forming infection.  MRI of left foot without contrast shows soft tissue ulceration and findings suggestive for acute osteomyelitis of fifth metatarsal head, fifth metatarsal shaft, distal phalanx of left great toe and mild extensor digitorum brevis tenosynovitis.   LE Doppler on 6/12 concerning for some stenosis in SFA, DFA and PT with monophasic flow throughout.  ABI with moderate right and mild left PAD improved from prior ABI.  Plan for aortogram today  Subjective: Seen and examined earlier this morning.  No major events overnight of this morning.  He complains pain in his left leg that he contributes to his neuropathy.  He rates his pain 6/10.  No other complaints.  Objective: Vitals:   12/16/21 1343 12/16/21 1932 12/17/21 0404 12/17/21 0723  BP: 125/63 (!) 155/65 (!) 154/75 136/75  Pulse: 74 91 95 98  Resp: _0 Temp: 99.1 F (37.3 C)  99.9 F (37.7 C) 98.4 F (36.9 C) 99.8 F (37.7 C)  TempSrc: Oral   Oral  SpO2: (!) 89% 99% 97% 96%    Examination:  GENERAL: No apparent distress.  Nontoxic. HEENT: MMM.  Vision and hearing grossly intact.  NECK: Supple.  No apparent JVD.  RESP:  No IWOB.  Fair aeration bilaterally. CVS:  RRR. Heart sounds normal.  ABD/GI/GU: BS+. Abd soft, NTND.  MSK/EXT:  Moves extremities. S/p right TMT amputation.  Dressing over left and right foot DCI.  Trace edema proximally. SKIN: no apparent skin lesion or wound NEURO: Sleepy but wakes to voice.  Fairly oriented.  No apparent focal neuro deficit. PSYCH: Calm. Normal affect.   Procedures:  None  Microbiology summarized: Blood cultures NGTD.  Assessment and plan: Principal Problem:   Gangrene of left foot (HCC) Active Problems:   Acute osteomyelitis of phalanx of foot, left (HCC)   Acute osteomyelitis of metacarpal bone, left (HCC)   Diabetic foot ulcer with osteomyelitis (HCC)   Severe sepsis (HCC)   Tenosynovitis of left foot   PAD (peripheral artery disease) (HCC)   AKI (acute kidney injury) ([place])   Type 2 diabetes mellitus ([place])   Hypertension associated with diabetes ([place])   Depression   GERD without esophagitis   Hyperlipidemia associated with type 2 diabetes mellitus (HCC)   S/P transmetatarsal amputation of foot, right (HCC)   Normocytic anemia  Severe sepsis: POA.  Had tachycardia, leukocytosis, lactic acidosis and AKI.  Now spiking fever. Acute osteomyelitis of left  foot  Diabetic foot ulcer with osteomyelitis in patient with uncontrolled IDDM-2 Tenosynovitis of left foot Left great toe gangrenous change Bilateral peripheral artery disease -X-ray, MRI, LE arterial duplex and ABI as above. -CRP 26.4.  ESR 139. -Blood cultures NGTD. -Continue vancomycin, cefepime and Flagyl. -Plan for aortogram by vascular surgery -Continue Lipitor, Plavix and aspirin -Increase gabapentin to 300 mg in the morning and afternoon  and 600 mg at night -Podiatry and vascular surgery following -Pain control with bowel regimen  Uncontrolled IDDM-2 with neuropathy, PAD, ulcer infection with osteomyelitis and hyperglycemia A1c 8.0%. Recent Labs  Lab 12/16/21 1115 12/16/21 1555 12/16/21 1932 12/17/21 0803 12/17/21 1134  GLUCAP 202* 133* 149* 189* 138*  -Continue SSI-sensitive -Continue Semglee 10 units twice daily -Continue Lipitor 80 mg daily -Increased gabapentin as above.   AKI: Resolved. Recent Labs    09/18/21 0152 09/22/21 0411 10/07/21 0000 11/05/21 1223 12/03/21 1244 12/08/21 1336 12/14/21 1540 12/15/21 0448 12/16/21 0123 12/17/21 0141  BUN 13 6* _0 CREATININE 0.77 0.70 0.7 0.98 0.80 0.80 1.32* 0.96 0.78 0.82  -Monitor   Normocytic anemia: Stable after initial drop. Recent Labs    09/21/21 0114 09/22/21 0411 10/07/21 0000 11/05/21 1223 12/03/21 1244 12/08/21 1336 12/14/21 1540 12/15/21 0448 12/16/21 0123 12/17/21 0141  HGB 9.1* 8.4* 11.1* 13.4 16.0 12.6* 12.5* 10.6* 10.6* 10.8*  -Continue monitoring  Hypertension associated with diabetes: BP within acceptable range. -Continue home amlodipine and Lopressor. -Continue holding lisinopril   Depression/mood disorder: Stable. -Continue home bupropion, Latuda, Trileptal, Zyprexa.   Hyperlipidemia associated with type 2 diabetes mellitus (HCC) -Continue atorvastatin.  OSA not on CPAP  Thrombocytosis/bandemia: likely due to sepsis. -Continue monitoring  Obesity: BMI 31.14. -Encourage lifestyle change to lose weight -May benefit from GLP-1 agonist.  GERD: -Continue PPI  DVT prophylaxis:  heparin injection 5,000 Units Start: 12/14/21 2200  Code Status: Full code Family Communication: None at bedside Level of care: Med-Surg Status is: Inpatient Remains inpatient appropriate because: Due to diabetic foot ulcer infection with osteomyelitis requiring IV antibiotics, further evaluation and surgical  intervention   Final disposition: TBD Consultants:  Vascular surgery Podiatry  Sch Meds:  Scheduled Meds:  (feeding supplement) PROSource Plus  30 mL Oral Daily   amLODipine  5 mg Oral q morning   aspirin EC  81 mg Oral Daily   atorvastatin  80 mg Oral QHS   buPROPion  300 mg Oral q morning   cholecalciferol  1,000 Units Oral Daily   clopidogrel  75 mg Oral Daily   feeding supplement  237 mL Oral BID BM   fluticasone furoate-vilanterol  1 puff Inhalation Daily   gabapentin  300 mg Oral BID   heparin  5,000 Units Subcutaneous Q8H   insulin aspart  0-5 Units Subcutaneous QHS   insulin aspart  0-9 Units Subcutaneous TID WC   insulin glargine-yfgn  10 Units Subcutaneous BID   lurasidone  40 mg Oral Q supper   metoprolol tartrate  50 mg Oral BID   OLANZapine  2.5 mg Oral QHS   OXcarbazepine  150 mg Oral BID   pantoprazole  40 mg Oral QHS   Continuous Infusions:  sodium chloride 100 mL/hr at 12/17/21 1046   ceFEPime (MAXIPIME) IV Stopped (12/17/21 1345)   metronidazole 500 mg (12/17/21 0814)   vancomycin Stopped (12/17/21 1040)   PRN Meds:.acetaminophen **OR** acetaminophen, albuterol, HYDROcodone-acetaminophen, meclizine, ondansetron **OR** ondansetron (ZOFRAN) IV, oxyCODONE, senna-docusate  Antimicrobials: Anti-infectives (From admission, onward)  Start     Dose/Rate Route Frequency Ordered Stop   12/16/21 1400  ceFEPIme (MAXIPIME) 2 g in sodium chloride 0.9 % 100 mL IVPB        2 g 200 mL/hr over 30 Minutes Intravenous Every 8 hours 12/16/21 1259     12/16/21 0200  vancomycin (VANCOREADY) IVPB 1500 mg/300 mL        1,500 mg 150 mL/hr over 120 Minutes Intravenous Every 24 hours 12/15/21 1108     12/15/21 2200  vancomycin (VANCOREADY) IVPB 1250 mg/250 mL  Status:  Discontinued        1,250 mg 166.7 mL/hr over 90 Minutes Intravenous Every 24 hours 12/14/21 2022 12/15/21 1108   12/14/21 2100  metroNIDAZOLE (FLAGYL) IVPB 500 mg        500 mg 100 mL/hr over 60 Minutes  Intravenous Every 12 hours 12/14/21 2022     12/14/21 2030  vancomycin (VANCOREADY) IVPB 2000 mg/400 mL        2,000 mg 200 mL/hr over 120 Minutes Intravenous  Once 12/14/21 2015 12/15/21 0516   12/14/21 2000  cefTRIAXone (ROCEPHIN) 2 g in sodium chloride 0.9 % 100 mL IVPB  Status:  Discontinued        2 g 200 mL/hr over 30 Minutes Intravenous Every 24 hours 12/14/21 1948 12/16/21 1242   12/14/21 2000  metroNIDAZOLE (FLAGYL) IVPB 500 mg  Status:  Discontinued        500 mg 100 mL/hr over 60 Minutes Intravenous Every 8 hours 12/14/21 1948 12/14/21 2022        I have personally reviewed the following labs and images: CBC: Recent Labs  Lab 12/14/21 1540 12/15/21 0448 12/16/21 0123 12/17/21 0141  WBC 16.8* 16.8* 14.1* 13.6*  NEUTROABS 13.0*  --   --   --   HGB 12.5* 10.6* 10.6* 10.8*  HCT 40.1 32.8* 33.5* 32.0*  MCV 82.2 80.0 79.0* 77.3*  PLT 598* 549* 589* 631*   BMP &GFR Recent Labs  Lab 12/14/21 1540 12/15/21 0448 12/16/21 0123 12/17/21 0141  NA 134* 134* 133* 134*  K 4.3 4.0 4.0 4.0  CL 99 101 103 99  CO2 21* 21* 22 22  GLUCOSE 91 198* 161* 146*  BUN _0 CREATININE 1.32* 0.96 0.78 0.82  CALCIUM 9.2 8.2* 8.3* 8.7*  MG  --   --   --  1.9  PHOS  --   --   --  2.7   Estimated Creatinine Clearance: 101.5 mL/min (by C-G formula based on SCr of 0.82 mg/dL). Liver & Pancreas: Recent Labs  Lab 12/14/21 1540 12/17/21 0141  AST 21  --   ALT 22  --   ALKPHOS 82  --   BILITOT 0.6  --   PROT 8.0  --   ALBUMIN 3.1* 2.4*   No results for input(s): "LIPASE", "AMYLASE" in the last 168 hours. No results for input(s): "AMMONIA" in the last 168 hours. Diabetic: Recent Labs    12/17/21 0141  HGBA1C 8.0*   Recent Labs  Lab 12/16/21 1115 12/16/21 1555 12/16/21 1932 12/17/21 0803 12/17/21 1134  GLUCAP 202* 133* 149* 189* 138*   Cardiac Enzymes: No results for input(s): "CKTOTAL", "CKMB", "CKMBINDEX", "TROPONINI" in the last 168 hours. No results for  input(s): "PROBNP" in the last 8760 hours. Coagulation Profile: No results for input(s): "INR", "PROTIME" in the last 168 hours. Thyroid Function Tests: No results for input(s): "TSH", "T4TOTAL", "FREET4", "T3FREE", "THYROIDAB" in the last 72 hours. Lipid Profile:  Recent Labs    12/17/21 0141  CHOL 90  HDL 16*  LDLCALC 55  TRIG 94  CHOLHDL 5.6   Anemia Panel: No results for input(s): "VITAMINB12", "FOLATE", "FERRITIN", "TIBC", "IRON", "RETICCTPCT" in the last 72 hours. Urine analysis:    Component Value Date/Time   COLORURINE YELLOW 11/05/2021 1224   APPEARANCEUR CLEAR 11/05/2021 1224   LABSPEC 1.031 (H) 11/05/2021 1224   PHURINE 5.0 11/05/2021 1224   GLUCOSEU >=500 (A) 11/05/2021 1224   HGBUR NEGATIVE 11/05/2021 1224   BILIRUBINUR NEGATIVE 11/05/2021 1224   KETONESUR NEGATIVE 11/05/2021 1224   PROTEINUR NEGATIVE 11/05/2021 1224   UROBILINOGEN 2.0 (H) 02/17/2015 2051   NITRITE NEGATIVE 11/05/2021 1224   LEUKOCYTESUR NEGATIVE 11/05/2021 1224   Sepsis Labs: Invalid input(s): "PROCALCITONIN", "LACTICIDVEN"  Microbiology: Recent Results (from the past 240 hour(s))  Blood culture (routine x 2)     Status: None (Preliminary result)   Collection Time: 12/14/21  3:40 PM   Specimen: BLOOD RIGHT HAND  Result Value Ref Range Status   Specimen Description BLOOD RIGHT HAND  Final   Special Requests   Final    BOTTLES DRAWN AEROBIC ONLY Blood Culture results may not be optimal due to an inadequate volume of blood received in culture bottles   Culture   Final    NO GROWTH 3 DAYS Performed at [facility], [place] [address], [place], [place] [address]    Report Status PENDING  Incomplete  Blood culture (routine x 2)     Status: None (Preliminary result)   Collection Time: 12/14/21  6:40 PM   Specimen: BLOOD RIGHT FOREARM  Result Value Ref Range Status   Specimen Description BLOOD RIGHT FOREARM  Final   Special Requests   Final    BOTTLES DRAWN AEROBIC AND ANAEROBIC Blood  Culture adequate volume   Culture   Final    NO GROWTH 3 DAYS Performed at [facility], [place] [address], [place], [place] [address]    Report Status PENDING  Incomplete    Radiology Studies: No results found.    Jillana Selph T. [place]  If 7PM-7AM, please contact night-coverage [email or web address] 12/17/2021, 2:30 PM

## 2021-12-17 NOTE — Progress Notes (Signed)
  Progress Note    12/17/2021 5:59 PM Day of Surgery  Subjective:  Hungry   Vitals:   12/17/21 1513 12/17/21 1756  BP:    Pulse:    Resp: 18   Temp:    SpO2:  98%   Physical Exam: Cardiac:  regular Lungs:  non labored Extremities:  Bilateral feet dressed. 2+ femoral pulses bilaterally. Left PT signal.  Neurologic: alert and oriented  CBC    Component Value Date/Time   WBC 13.6 (H) 12/17/2021 0141   RBC 4.14 (L) 12/17/2021 0141   HGB 10.8 (L) 12/17/2021 0141   HCT 32.0 (L) 12/17/2021 0141   PLT 631 (H) 12/17/2021 0141   MCV 77.3 (L) 12/17/2021 0141   MCH 26.1 12/17/2021 0141   MCHC 33.8 12/17/2021 0141   RDW 14.8 12/17/2021 0141   LYMPHSABS 1.7 12/14/2021 1540   MONOABS 1.9 (H) 12/14/2021 1540   EOSABS 0.1 12/14/2021 1540   BASOSABS 0.1 12/14/2021 1540    BMET    Component Value Date/Time   NA 134 (L) 12/17/2021 0141   NA 135 (A) 10/07/2021 0000   K 4.0 12/17/2021 0141   CL 99 12/17/2021 0141   CO2 22 12/17/2021 0141   GLUCOSE 146 (H) 12/17/2021 0141   BUN 12 12/17/2021 0141   BUN 12 10/07/2021 0000   CREATININE 0.82 12/17/2021 0141   CALCIUM 8.7 (L) 12/17/2021 0141   GFRNONAA >60 12/17/2021 0141   GFRAA >60 02/17/2015 2030    INR No results found for: "INR"   Intake/Output Summary (Last 24 hours) at 12/17/2021 1759 Last data filed at 12/17/2021 0900 Gross per 24 hour  Intake --  Output 450 ml  Net -450 ml      Assessment/Plan:  66 y.o. male with dry gangrene of left GT and 5th toe ulcer with osteo s/p recent revascularization  Spoke to Dr. Posey Pronto, [place] for Thursday  Angiogram today  Cassandria Santee, MD [facility] Phone Number: [phone] 12/17/2021 5:59 PM

## 2021-12-17 NOTE — Progress Notes (Signed)
  Subjective:  Patient ID: [name], male    DOB: [birth year],  MRN: [id number]  A 66 y.o. male doing well at bedside.  No acute complaints.  Patient is undergoing angiogram today with vascular surgery.  He denies any nausea fever chills vomiting  Objective:   Vitals:   12/17/21 0404 12/17/21 0723  BP: (!) 154/75 136/75  Pulse: 95 98  Resp: 18 17  Temp: 98.4 F (36.9 C) 99.8 F (37.7 C)  SpO2: 97% 96%   General AA&O x3. Normal mood and affect.  Vascular Dorsalis pedis and posterior tibial pulses nonpalpable Brisk capillary refill to diminished to all digits pedal hair not present.  Neurologic Epicritic sensation grossly intact.  Dermatologic Left hallux gangrene with mild malodor present.  No redness noted.  Appears to be more dry and stable in nature.  Left lateral fifth metatarsal head ulceration with some superficial purulent drainage.  Malodor present.  Probing down to bone consistent with osteomyelitis.  No cellulitis noted.  Duskiness changes noted to forefoot  Orthopedic: MMT 5/5 in dorsiflexion, plantarflexion, inversion, and eversion. Normal joint ROM without pain or crepitus.       IMPRESSION: 1. Soft tissue ulceration plantar lateral to the fifth metatarsal head contacting the bone where there is mild cortical erosion. Marrow edema throughout the fifth metatarsal head and fifth metatarsal shaft indicating fifth metatarsal head acute osteomyelitis possibly extending proximally into the metatarsal shaft. 2. Focal soft tissue ulceration within the distal tip of the great toe with mild cortical erosion within the distal aspect of the distal phalanx and diffuse marrow edema throughout the distal phalanx indicating acute osteomyelitis. 3. Moderate plantar and dorsal midfoot nonspecific myositis. This may be bland or infected. 4. Mild extensor digitorum brevis tenosynovitis.  Assessment & Plan:  Patient was evaluated and treated and all questions  answered.  Left hallux gangrene and left fifth metatarsal head ulceration with underlying osteomyelitis -All questions or concerns were discussed with the patient in extensive detail -Patient scheduled to go undergo endovascular revascularization today -I will plan on taking him back to the operating room tomorrow for partial fifth ray amputation as well as partial hallux amputation of the left side. -N.p.o. after midnight -Partial weightbearing to the heel -Betadine wet-to-dry dressing changes daily  [name], DPM  Accessible via secure chat for questions or concerns.

## 2021-12-17 NOTE — Op Note (Signed)
    Patient name: [name] MRN: [id number] DOB: 08/17/1955 Sex: male  12/17/2021 Pre-operative Diagnosis: left leg rutherford 5 critical limb ischemia Post-operative diagnosis:  Same Surgeon:  [name], MD Procedure Performed: 1.  Ultrasound-guided micropuncture access of the right common femoral artery in retrograde fashion 2.  Second order cannulation, left low extremity angiogram.  3.  Device assisted closure, mynx     Indications: Patient is a 66 year old male with multiple interventions on bilateral lower extremities, most recently left lower extremity complex revascularization from both antegrade and retrograde approaches by my partner Dr. Donzetta Matters.  This resulted in superficial femoral artery stenting, single-vessel posterior tibial outflow to the foot.  He was recently admitted to the hospital with new onset foot infection.  Duplex ultrasonography demonstrated concern for high-grade stenosis in the superficial femoral artery.  After discussing the risk and benefits of diagnostic angiogram with possible intervention for assisted primary stent patency, Danial elected to proceed.  Findings:   Left common femoral artery patent, profunda patent, interval stents appreciated in the superficial femoral artery extending a small amount into the common femoral artery.  This does not jail the profunda.  Distally, the superficial femoral artery is lined with self-expanding stents, no flow-limiting stenosis.  The popliteal artery is without flow-limiting stenosis.  Outflow is brisk single-vessel posterior tibial runoff to the foot.  There is an area of stenosis less than 50% of the mid posterior tibial artery.  The medial and lateral plantar arteries fill in the foot, no filling of the dorsalis pedis.  The pedal arch is not intact.   Procedure:  The patient was identified in the holding area and taken to room 8.  The patient was then placed supine on the table and prepped and draped in the  usual sterile fashion.  A time out was called.  Ultrasound was used to evaluate the right common femoral artery.  It was patent .  A digital ultrasound image was acquired.  A micropuncture needle was used to access the right common femoral artery under ultrasound guidance.  An 018 wire was advanced without resistance and a micropuncture sheath was placed.  The 018 wire was removed and a benson wire was placed.  The micropuncture sheath was exchanged for a 5 french sheath.  An omniflush catheter was advanced over the aortic bifurcation into the left external iliac artery.  Left lower extremity runoff was obtained.  Impression: Patient maximally revascularized.  No intervention taken.     Cassandria Santee, MD [facility] [place] Office: [phone]

## 2021-12-17 NOTE — Care Management Important Message (Signed)
Important Message  Patient Details  Name: [name] MRN: [id number] Date of Birth: [birth year]   Medicare Important Message Given:  Yes     Stpehanie Montroy 12/17/2021, 3:30 PM

## 2021-12-18 ENCOUNTER — Inpatient Hospital Stay (HOSPITAL_COMMUNITY): Payer: Medicare HMO | Admitting: Certified Registered"

## 2021-12-18 ENCOUNTER — Encounter (HOSPITAL_COMMUNITY): Payer: Self-pay | Admitting: Internal Medicine

## 2021-12-18 ENCOUNTER — Encounter (HOSPITAL_COMMUNITY)
Admission: EM | Disposition: A | Discharge status: Skilled Nursing Facility | Payer: Self-pay | Source: Home / Self Care | Attending: Student

## 2021-12-18 DIAGNOSIS — E871 Hypo-osmolality and hyponatremia: Secondary | ICD-10-CM

## 2021-12-18 DIAGNOSIS — I96 Gangrene, not elsewhere classified: Secondary | ICD-10-CM

## 2021-12-18 DIAGNOSIS — I10 Essential (primary) hypertension: Secondary | ICD-10-CM | POA: Diagnosis not present

## 2021-12-18 DIAGNOSIS — M86172 Other acute osteomyelitis, left ankle and foot: Secondary | ICD-10-CM | POA: Diagnosis not present

## 2021-12-18 DIAGNOSIS — Z794 Long term (current) use of insulin: Secondary | ICD-10-CM

## 2021-12-18 DIAGNOSIS — M86142 Other acute osteomyelitis, left hand: Secondary | ICD-10-CM | POA: Diagnosis not present

## 2021-12-18 DIAGNOSIS — E1169 Type 2 diabetes mellitus with other specified complication: Secondary | ICD-10-CM | POA: Diagnosis not present

## 2021-12-18 DIAGNOSIS — E11621 Type 2 diabetes mellitus with foot ulcer: Secondary | ICD-10-CM | POA: Diagnosis not present

## 2021-12-18 DIAGNOSIS — M869 Osteomyelitis, unspecified: Secondary | ICD-10-CM

## 2021-12-18 HISTORY — PX: AMPUTATION TOE: SHX6595

## 2021-12-18 LAB — GLUCOSE, CAPILLARY
Glucose-Capillary: 166 mg/dL — ABNORMAL HIGH (ref 70–99)
Glucose-Capillary: 144 mg/dL — ABNORMAL HIGH (ref 70–99)
Glucose-Capillary: 127 mg/dL — ABNORMAL HIGH (ref 70–99)
Glucose-Capillary: 174 mg/dL — ABNORMAL HIGH (ref 70–99)
Glucose-Capillary: 219 mg/dL — ABNORMAL HIGH (ref 70–99)

## 2021-12-18 LAB — LIPID PANEL
Cholesterol: 86 mg/dL (ref 0–200)
HDL: 14 mg/dL — ABNORMAL LOW (ref >40)
LDL Cholesterol: 43 mg/dL (ref 0–99)
Total CHOL/HDL Ratio: 6.1 RATIO
Triglycerides: 143 mg/dL (ref <150)
VLDL: 29 mg/dL (ref 0–40)

## 2021-12-18 LAB — MAGNESIUM: Magnesium: 1.9 mg/dL (ref 1.7–2.4)

## 2021-12-18 LAB — CBC
HCT: 34.8 % — ABNORMAL LOW (ref 39.0–52.0)
Hemoglobin: 11.1 g/dL — ABNORMAL LOW (ref 13.0–17.0)
MCH: 24.8 pg — ABNORMAL LOW (ref 26.0–34.0)
MCHC: 31.9 g/dL (ref 30.0–36.0)
MCV: 77.9 fL — ABNORMAL LOW (ref 80.0–100.0)
Platelets: 649 10*3/uL — ABNORMAL HIGH (ref 150–400)
RBC: 4.47 MIL/uL (ref 4.22–5.81)
RDW: 15 % (ref 11.5–15.5)
WBC: 14.3 10*3/uL — ABNORMAL HIGH (ref 4.0–10.5)
nRBC: 0 % (ref 0.0–0.2)

## 2021-12-18 LAB — RENAL FUNCTION PANEL
Albumin: 2.3 g/dL — ABNORMAL LOW (ref 3.5–5.0)
Anion gap: 11 (ref 5–15)
BUN: 11 mg/dL (ref 8–23)
CO2: 20 mmol/L — ABNORMAL LOW (ref 22–32)
Calcium: 8.2 mg/dL — ABNORMAL LOW (ref 8.9–10.3)
Chloride: 102 mmol/L (ref 98–111)
Creatinine, Ser: 0.79 mg/dL (ref 0.61–1.24)
GFR, Estimated: >60 mL/min (ref >60)
Glucose, Bld: 205 mg/dL — ABNORMAL HIGH (ref 70–99)
Phosphorus: 2 mg/dL — ABNORMAL LOW (ref 2.5–4.6)
Potassium: 4 mmol/L (ref 3.5–5.1)
Sodium: 133 mmol/L — ABNORMAL LOW (ref 135–145)

## 2021-12-18 LAB — SURGICAL PCR SCREEN
MRSA, PCR: POSITIVE — AB
Staphylococcus aureus: POSITIVE — AB

## 2021-12-18 SURGERY — AMPUTATION, TOE
Anesthesia: General | Site: Toe | Laterality: Left

## 2021-12-18 MED ORDER — MEPERIDINE HCL 25 MG/ML IJ SOLN: 6.2500 mg | INTRAMUSCULAR | Status: DC | PRN

## 2021-12-18 MED ORDER — OXYCODONE HCL 5 MG/5ML PO SOLN: 5.0000 mg | Freq: Once | ORAL | Status: DC | PRN

## 2021-12-18 MED ORDER — MIDAZOLAM HCL 2 MG/2ML IJ SOLN
INTRAMUSCULAR | Status: AC
  Filled 2021-12-18: qty 2

## 2021-12-18 MED ORDER — PROPOFOL 10 MG/ML IV BOLUS
INTRAVENOUS | Status: DC | PRN
  Administered 2021-12-18: 130 mg via INTRAVENOUS

## 2021-12-18 MED ORDER — FENTANYL CITRATE (PF) 250 MCG/5ML IJ SOLN
INTRAMUSCULAR | Status: DC | PRN
  Administered 2021-12-18 (×2): 50 ug via INTRAVENOUS
  Administered 2021-12-18 (×2): 25 ug via INTRAVENOUS

## 2021-12-18 MED ORDER — SODIUM PHOSPHATES 45 MMOLE/15ML IV SOLN
15.0000 mmol | Freq: Once | INTRAVENOUS | Status: AC
  Administered 2021-12-18: 15 mmol via INTRAVENOUS
  Filled 2021-12-18: qty 5

## 2021-12-18 MED ORDER — 0.9 % SODIUM CHLORIDE (POUR BTL) OPTIME
TOPICAL | Status: DC | PRN
  Administered 2021-12-18: 1000 mL

## 2021-12-18 MED ORDER — INSULIN ASPART 100 UNIT/ML IJ SOLN
0.0000 [IU] | INTRAMUSCULAR | Status: DC | PRN
  Administered 2021-12-18: 2 [IU] via SUBCUTANEOUS

## 2021-12-18 MED ORDER — CHLORHEXIDINE GLUCONATE 0.12 % MT SOLN
OROMUCOSAL | Status: AC
  Administered 2021-12-18: 15 mL via OROMUCOSAL
  Filled 2021-12-18: qty 15

## 2021-12-18 MED ORDER — ONDANSETRON HCL 4 MG/2ML IJ SOLN
INTRAMUSCULAR | Status: AC
Start: 2021-12-18 — End: ?
  Filled 2021-12-18: qty 2

## 2021-12-18 MED ORDER — BUPIVACAINE HCL (PF) 0.5 % IJ SOLN
INTRAMUSCULAR | Status: AC
  Filled 2021-12-18: qty 30

## 2021-12-18 MED ORDER — HYDROMORPHONE HCL 1 MG/ML IJ SOLN
0.2500 mg | INTRAMUSCULAR | Status: DC | PRN
  Administered 2021-12-18 (×2): 0.5 mg via INTRAVENOUS

## 2021-12-18 MED ORDER — LACTATED RINGERS IV SOLN: INTRAVENOUS | Status: DC

## 2021-12-18 MED ORDER — HYDROMORPHONE HCL 1 MG/ML IJ SOLN
INTRAMUSCULAR | Status: AC
  Filled 2021-12-18: qty 1

## 2021-12-18 MED ORDER — ORAL CARE MOUTH RINSE: 15.0000 mL | Freq: Once | OROMUCOSAL | Status: AC

## 2021-12-18 MED ORDER — MIDAZOLAM HCL 2 MG/2ML IJ SOLN
INTRAMUSCULAR | Status: DC | PRN
  Administered 2021-12-18: 1 mg via INTRAVENOUS

## 2021-12-18 MED ORDER — OXYCODONE HCL 5 MG PO TABS: 5.0000 mg | ORAL_TABLET | Freq: Once | ORAL | Status: DC | PRN

## 2021-12-18 MED ORDER — CHLORHEXIDINE GLUCONATE 0.12 % MT SOLN: 15.0000 mL | Freq: Once | OROMUCOSAL | Status: AC

## 2021-12-18 MED ORDER — LIDOCAINE-EPINEPHRINE 1 %-1:100000 IJ SOLN
INTRAMUSCULAR | Status: AC
  Filled 2021-12-18: qty 1

## 2021-12-18 MED ORDER — LIDOCAINE 2% (20 MG/ML) 5 ML SYRINGE
INTRAMUSCULAR | Status: AC
  Filled 2021-12-18: qty 5

## 2021-12-18 MED ORDER — PROPOFOL 10 MG/ML IV BOLUS
INTRAVENOUS | Status: AC
  Filled 2021-12-18: qty 20

## 2021-12-18 MED ORDER — LIDOCAINE 2% (20 MG/ML) 5 ML SYRINGE
INTRAMUSCULAR | Status: DC | PRN
  Administered 2021-12-18: 60 mg via INTRAVENOUS

## 2021-12-18 MED ORDER — MUPIROCIN 2 % EX OINT
1.0000 "application " | TOPICAL_OINTMENT | Freq: Two times a day (BID) | CUTANEOUS | Status: AC
  Administered 2021-12-18 – 2021-12-22 (×10): 1 via NASAL
  Filled 2021-12-18 (×5): qty 22

## 2021-12-18 MED ORDER — BUPIVACAINE HCL 0.5 % IJ SOLN
INTRAMUSCULAR | Status: AC
  Filled 2021-12-18: qty 1

## 2021-12-18 MED ORDER — ONDANSETRON HCL 4 MG/2ML IJ SOLN
INTRAMUSCULAR | Status: DC | PRN
  Administered 2021-12-18: 4 mg via INTRAVENOUS

## 2021-12-18 MED ORDER — MIDAZOLAM HCL 2 MG/2ML IJ SOLN: 0.5000 mg | Freq: Once | INTRAMUSCULAR | Status: DC | PRN

## 2021-12-18 MED ORDER — FENTANYL CITRATE (PF) 250 MCG/5ML IJ SOLN
INTRAMUSCULAR | Status: AC
  Filled 2021-12-18: qty 5

## 2021-12-18 MED ORDER — CHLORHEXIDINE GLUCONATE CLOTH 2 % EX PADS
6.0000 | MEDICATED_PAD | Freq: Every day | CUTANEOUS | Status: AC
  Administered 2021-12-18 – 2021-12-22 (×5): 6 via TOPICAL

## 2021-12-18 SURGICAL SUPPLY — 34 items
BAG COUNTER SPONGE SURGICOUNT (BAG) IMPLANT
BLADE AVERAGE 25X9 (BLADE) ×1 IMPLANT
BNDG COHESIVE 4X5 TAN STRL (GAUZE/BANDAGES/DRESSINGS) ×2 IMPLANT
BNDG ELASTIC 4X5.8 VLCR STR LF (GAUZE/BANDAGES/DRESSINGS) ×2 IMPLANT
BNDG ESMARK 4X9 LF (GAUZE/BANDAGES/DRESSINGS) IMPLANT
BNDG GAUZE ELAST 4 BULKY (GAUZE/BANDAGES/DRESSINGS) ×2 IMPLANT
COVER SURGICAL LIGHT HANDLE (MISCELLANEOUS) ×4 IMPLANT
CUFF TOURN SGL QUICK 18X4 (TOURNIQUET CUFF) ×2 IMPLANT
DRSG PAD ABDOMINAL 8X10 ST (GAUZE/BANDAGES/DRESSINGS) ×2 IMPLANT
ELECT REM PT RETURN 9FT ADLT (ELECTROSURGICAL) ×2
ELECTRODE REM PT RTRN 9FT ADLT (ELECTROSURGICAL) ×1 IMPLANT
GAUZE PACKING IODOFORM 1INX5YD (GAUZE/BANDAGES/DRESSINGS) ×1
GAUZE SPONGE 4X4 12PLY STRL (GAUZE/BANDAGES/DRESSINGS) ×2 IMPLANT
GLOVE BIO SURGEON STRL SZ7 (GLOVE) ×2 IMPLANT
GLOVE BIOGEL PI IND STRL 7.5 (GLOVE) ×1 IMPLANT
GLOVE BIOGEL PI INDICATOR 7.5 (GLOVE) ×1
GOWN STRL REUS W/ TWL LRG LVL3 (GOWN DISPOSABLE) ×2 IMPLANT
GOWN STRL REUS W/TWL LRG LVL3 (GOWN DISPOSABLE) ×4
IV NS 1000ML (IV SOLUTION) ×2
IV NS 1000ML BAXH (IV SOLUTION) ×1 IMPLANT
KIT BASIN OR (CUSTOM PROCEDURE TRAY) ×2 IMPLANT
KIT TURNOVER KIT B (KITS) ×2 IMPLANT
MANIFOLD NEPTUNE II (INSTRUMENTS) ×2 IMPLANT
MICROMATRIX 500MG (Tissue) ×2 IMPLANT
NEEDLE 22X1 1/2 (OR ONLY) (NEEDLE) IMPLANT
NS IRRIG 1000ML POUR BTL (IV SOLUTION) ×2 IMPLANT
PACK ORTHO EXTREMITY (CUSTOM PROCEDURE TRAY) ×2 IMPLANT
PACKING GAUZE IODOFORM 1INX5YD (GAUZE/BANDAGES/DRESSINGS) IMPLANT
PAD ABD 8X10 STRL (GAUZE/BANDAGES/DRESSINGS) ×1 IMPLANT
PAD ARMBOARD 7.5X6 YLW CONV (MISCELLANEOUS) ×4 IMPLANT
SOLUTION PARTIC MCRMTRX 500MG (Tissue) IMPLANT
SUT PROLENE 3 0 PS 1 (SUTURE) ×2 IMPLANT
SYR CONTROL 10ML LL (SYRINGE) IMPLANT
TOWEL GREEN STERILE (TOWEL DISPOSABLE) ×2 IMPLANT

## 2021-12-18 NOTE — Transfer of Care (Signed)
Immediate Anesthesia Transfer of Care Note  Patient: [name]  Procedure(s) Performed: AMPUTATION LEFT BIG TOE; LEFT 5TH RAY PARTIAL AMPUTATION (Left: Toe)  Patient Location: PACU  Anesthesia Type:General  Level of Consciousness: drowsy, patient cooperative and responds to stimulation  Airway & Oxygen Therapy: Patient Spontanous Breathing and Patient connected to nasal cannula oxygen  Post-op Assessment: Report given to RN and Post -op Vital signs reviewed and stable  Post vital signs: Reviewed and stable  Last Vitals:  Vitals Value Taken Time  BP 131/61 12/18/21 1311  Temp    Pulse 80 12/18/21 1312  Resp 16 12/18/21 1312  SpO2 99 % 12/18/21 1312  Vitals shown include unvalidated device data.  Last Pain:  Vitals:   12/18/21 1115  TempSrc:   PainSc: 6       Patients Stated Pain Goal: 3 (61/95/09 3267)  Complications: No notable events documented.

## 2021-12-18 NOTE — Anesthesia Preprocedure Evaluation (Addendum)
Anesthesia Evaluation  Patient identified by MRN, date of birth, ID band Patient awake    Reviewed: Allergy & Precautions, H&P , NPO status , Patient's Chart, lab work & pertinent test results  Airway Mallampati: II  TM Distance: >3 FB Neck ROM: Full    Dental no notable dental hx. (+) Edentulous Upper, Edentulous Lower, Dental Advisory Given   Pulmonary sleep apnea , former smoker,    Pulmonary exam normal breath sounds clear to auscultation       Cardiovascular hypertension, Pt. on medications and Pt. on home beta blockers + Peripheral Vascular Disease   Rhythm:Regular Rate:Normal     Neuro/Psych PSYCHIATRIC DISORDERS (PTSD) Anxiety Depression negative neurological ROS     GI/Hepatic Neg liver ROS, GERD  ,  Endo/Other  diabetes, Insulin Dependent  Renal/GU negative Renal ROS  negative genitourinary   Musculoskeletal   Abdominal   Peds  Hematology  (+) Blood dyscrasia, anemia , plavix   Anesthesia Other Findings   Reproductive/Obstetrics negative OB ROS                           Anesthesia Physical Anesthesia Plan  ASA: 3  Anesthesia Plan: General   Post-op Pain Management: Tylenol PO (pre-op)*   Induction: Intravenous  PONV Risk Score and Plan: 2 and Ondansetron, Dexamethasone and Treatment may vary due to age or medical condition  Airway Management Planned: LMA  Additional Equipment:   Intra-op Plan:   Post-operative Plan: Extubation in OR  Informed Consent: I have reviewed the patients History and Physical, chart, labs and discussed the procedure including the risks, benefits and alternatives for the proposed anesthesia with the patient or authorized representative who has indicated his/her understanding and acceptance.     Dental advisory given  Plan Discussed with: CRNA  Anesthesia Plan Comments:        Anesthesia Quick Evaluation

## 2021-12-18 NOTE — Progress Notes (Signed)
PROGRESS NOTE  [name] LJQ:[id number] DOB: [birth year]   PCP: Harvie Junior, MD  Patient is from: Home.  Lives with fianc.  Has rolling walker  DOA: 12/14/2021 LOS: 4  Chief complaints Chief Complaint  Patient presents with   Wound Infection     Brief Narrative / Interim history: 66 year old M with PMH of IDDM-2 with neuropathy and other complications, PVD s/p right below-knee popliteal artery to PTA bypass and right TMT amputation on 09/19/2021 and s/p left PTA PCI and stent to left SFA on 12/08/2021, COPD, OSA not on CPAP, HTN, HLD, GERD and depression presented with new pain and drainage from left small toe, subjective fever and decreased appetite.  He was on p.o. Keflex after recent hospitalization, which was discontinued by ID 4 days prior to presentation.  He was admitted with gangrenous left foot infection and PAD.  He was started on IV vancomycin, CTX and Flagyl.  Vascular surgery and podiatry consulted.    X-ray with subcutaneous soft tissue gas about left fifth metatarsal and metatarsophalangeal joint concerning for gas-forming infection.  MRI of left foot without contrast shows soft tissue ulceration and findings suggestive for acute osteomyelitis of fifth metatarsal head, fifth metatarsal shaft, distal phalanx of left great toe and mild extensor digitorum brevis tenosynovitis.   LE Doppler on 6/12 concerning for some stenosis in SFA, DFA and PT with monophasic flow throughout.  ABI with moderate right and mild left PAD improved from prior ABI.  Had aortogram on 6/14 that showed maximal revascularization without intervention.  Underwent left big toe amputation and left 3 partial amputation by Dr. Posey Pronto on 6/15.  Plan to return to the OR on 6/17  Subjective: Seen and examined earlier this morning before he went down for surgery.  No major events overnight of this morning.  Reports having restless night due to neuropathic pain in his left leg.  He states he feels fine  now.  Objective: Vitals:   12/18/21 1325 12/18/21 1340 12/18/21 1345 12/18/21 1401  BP: 122/66 126/63  (!) 147/79  Pulse: 85 85 84 84  Resp: _0 Temp:    99.5 F (37.5 C)  TempSrc:    Oral  SpO2: 95% 96% 97% 97%  Weight:      Height:        Examination:  GENERAL: No apparent distress.  Nontoxic. HEENT: MMM.  Vision and hearing grossly intact.  NECK: Supple.  No apparent JVD.  RESP:  No IWOB.  Fair aeration bilaterally. CVS:  RRR. Heart sounds normal.  ABD/GI/GU: BS+. Abd soft, NTND.  MSK/EXT:  Moves extremities. S/p right TMT amputation.  Dressing over left and right foot DCI.  Trace edema proximally. SKIN: no apparent skin lesion or wound NEURO: Awake and alert. Oriented appropriately.  No apparent focal neuro deficit. PSYCH: Calm. Normal affect.   Procedures:  None  Microbiology summarized: Blood cultures NGTD. Surgical MRSA PCR screen positive. Surgical tissue culture pending.  Assessment and plan: Principal Problem:   Gangrene of left foot (HCC) Active Problems:   Acute osteomyelitis of phalanx of foot, left (HCC)   Acute osteomyelitis of metacarpal bone, left (HCC)   Diabetic foot ulcer with osteomyelitis (HCC)   Severe sepsis (HCC)   Tenosynovitis of left foot   PAD (peripheral artery disease) (HCC)   AKI (acute kidney injury) ([place])   Type 2 diabetes mellitus ([place])   Hypertension associated with diabetes ([place])   Depression   GERD without esophagitis   Hyperlipidemia  associated with type 2 diabetes mellitus (HCC)   S/P transmetatarsal amputation of foot, right ([place])   Normocytic anemia   Bandemia   Thrombocytosis  Severe sepsis: POA.  Had tachycardia, leukocytosis, lactic acidosis and AKI.  Now spiking fever. Acute osteomyelitis of left foot  Diabetic foot ulcer with osteomyelitis in patient with uncontrolled IDDM-2 Tenosynovitis of left foot Left great toe gangrenous change Bilateral peripheral artery disease -X-ray, MRI, LE arterial  duplex and ABI as above. -CRP 26.4.  ESR 139.  Blood cultures NGTD.  Surgical MRSA screen PCR positive. -6/14-aortogram with maximally revascularized LLE without intervention -6/15-left big toe amputation and left 3 partial amputation by Dr. Posey Pronto  -Continue vancomycin, cefepime and Flagyl pending surgical culture -Continue Lipitor, Plavix and aspirin -Increased gabapentin to 300 mg in the morning and afternoon and 600 mg at night -Plan for return to the OR on 6/17 with podiatry  Uncontrolled IDDM-2 with neuropathy, PAD, ulcer infection with osteomyelitis and hyperglycemia A1c 8.0%. Recent Labs  Lab 12/17/21 1934 12/17/21 2115 12/18/21 0652 12/18/21 1056 12/18/21 1350  GLUCAP 98 156* 166* 144* 127*  -Continue SSI-sensitive -Continue Semglee 10 units twice daily -Continue Lipitor 80 mg daily -Increased gabapentin as above.   AKI: Resolved. Recent Labs    09/22/21 0411 10/07/21 0000 11/05/21 1223 12/03/21 1244 12/08/21 1336 12/14/21 1540 12/15/21 0448 12/16/21 0123 12/17/21 0141 12/18/21 0118  BUN 6* _0 CREATININE 0.70 0.7 0.98 0.80 0.80 1.32* 0.96 0.78 0.82 0.79  -Monitor   Normocytic anemia: Stable after initial drop. Recent Labs    10/07/21 0000 11/05/21 1223 12/03/21 1244 12/08/21 1336 12/14/21 1540 12/15/21 0448 12/16/21 0123 12/17/21 0141 12/17/21 2019 12/18/21 0118  HGB 11.1* 13.4 16.0 12.6* 12.5* 10.6* 10.6* 10.8* 12.0* 11.1*  -Continue monitoring  Hypertension associated with diabetes: BP within acceptable range. -Continue home amlodipine and Lopressor. -Continue holding lisinopril   Depression/mood disorder: Stable. -Continue home bupropion, Latuda, Trileptal, Zyprexa.   Hyperlipidemia associated with type 2 diabetes mellitus (HCC) -Continue atorvastatin.  OSA not on CPAP  Thrombocytosis/bandemia: likely due to sepsis. -Continue monitoring  Hyponatremia: Mild. -Continue monitoring  Obesity: BMI  31.14. -Encourage lifestyle change to lose weight -May benefit from GLP-1 agonist.  GERD: -Continue PPI  DVT prophylaxis:  heparin injection 5,000 Units Start: 12/14/21 2200  Code Status: Full code Family Communication: None at bedside Level of care: Progressive Cardiac Status is: Inpatient Remains inpatient appropriate because: Due to diabetic foot ulcer infection with osteomyelitis requiring IV antibiotics, further evaluation and surgical intervention   Final disposition: TBD Consultants:  Vascular surgery Podiatry  Sch Meds:  Scheduled Meds:  (feeding supplement) PROSource Plus  30 mL Oral Daily   amLODipine  5 mg Oral q morning   aspirin EC  81 mg Oral Daily   atorvastatin  80 mg Oral QHS   buPROPion  300 mg Oral q morning   Chlorhexidine Gluconate Cloth  6 each Topical Q0600   cholecalciferol  1,000 Units Oral Daily   clopidogrel  75 mg Oral Daily   feeding supplement  237 mL Oral BID BM   fluticasone furoate-vilanterol  1 puff Inhalation Daily   gabapentin  300 mg Oral [id number]   And   gabapentin  600 mg Oral QHS   heparin  5,000 Units Subcutaneous Q8H   HYDROmorphone       insulin aspart  0-5 Units Subcutaneous QHS   insulin aspart  0-9 Units Subcutaneous TID WC   insulin glargine-yfgn  10 Units Subcutaneous BID   lurasidone  40 mg Oral Q supper   metoprolol tartrate  50 mg Oral BID   mupirocin ointment  1 application  Nasal BID   OLANZapine  2.5 mg Oral QHS   OXcarbazepine  150 mg Oral BID   pantoprazole  40 mg Oral QHS   sodium chloride flush  3 mL Intravenous Q12H   Continuous Infusions:  sodium chloride 100 mL/hr at 12/18/21 0450   sodium chloride     ceFEPime (MAXIPIME) IV 2 g (12/18/21 1415)   metronidazole 500 mg (12/18/21 0856)   sodium phosphate 15 mmol in dextrose 5 % 250 mL infusion     vancomycin Stopped (12/18/21 0434)   PRN Meds:.sodium chloride, acetaminophen **OR** acetaminophen, albuterol, hydrALAZINE, HYDROcodone-acetaminophen,  HYDROmorphone, labetalol, meclizine, ondansetron **OR** ondansetron (ZOFRAN) IV, ondansetron (ZOFRAN) IV, oxyCODONE, senna-docusate, sodium chloride flush  Antimicrobials: Anti-infectives (From admission, onward)    Start     Dose/Rate Route Frequency Ordered Stop   12/16/21 1400  ceFEPIme (MAXIPIME) 2 g in sodium chloride 0.9 % 100 mL IVPB        2 g 200 mL/hr over 30 Minutes Intravenous Every 8 hours 12/16/21 1259     12/16/21 0200  vancomycin (VANCOREADY) IVPB 1500 mg/300 mL        1,500 mg 150 mL/hr over 120 Minutes Intravenous Every 24 hours 12/15/21 1108     12/15/21 2200  vancomycin (VANCOREADY) IVPB 1250 mg/250 mL  Status:  Discontinued        1,250 mg 166.7 mL/hr over 90 Minutes Intravenous Every 24 hours 12/14/21 2022 12/15/21 1108   12/14/21 2100  metroNIDAZOLE (FLAGYL) IVPB 500 mg        500 mg 100 mL/hr over 60 Minutes Intravenous Every 12 hours 12/14/21 2022     12/14/21 2030  vancomycin (VANCOREADY) IVPB 2000 mg/400 mL        2,000 mg 200 mL/hr over 120 Minutes Intravenous  Once 12/14/21 2015 12/15/21 0516   12/14/21 2000  cefTRIAXone (ROCEPHIN) 2 g in sodium chloride 0.9 % 100 mL IVPB  Status:  Discontinued        2 g 200 mL/hr over 30 Minutes Intravenous Every 24 hours 12/14/21 1948 12/16/21 1242   12/14/21 2000  metroNIDAZOLE (FLAGYL) IVPB 500 mg  Status:  Discontinued        500 mg 100 mL/hr over 60 Minutes Intravenous Every 8 hours 12/14/21 1948 12/14/21 2022        I have personally reviewed the following labs and images: CBC: Recent Labs  Lab 12/14/21 1540 12/15/21 0448 12/16/21 0123 12/17/21 0141 12/17/21 2019 12/18/21 0118  WBC 16.8* 16.8* 14.1* 13.6* 14.1* 14.3*  NEUTROABS 13.0*  --   --   --   --   --   HGB 12.5* 10.6* 10.6* 10.8* 12.0* 11.1*  HCT 40.1 32.8* 33.5* 32.0* 36.3* 34.8*  MCV 82.2 80.0 79.0* 77.3* 76.9* 77.9*  PLT 598* 549* 589* 631* 551* 649*   BMP &GFR Recent Labs  Lab 12/14/21 1540 12/15/21 0448 12/16/21 0123 12/17/21 0141  12/18/21 0118  NA 134* 134* 133* 134* 133*  K 4.3 4.0 4.0 4.0 4.0  CL 99 101 103 99 102  CO2 21* 21* 22 22 20*  GLUCOSE 91 198* 161* 146* 205*  BUN _0 CREATININE 1.32* 0.96 0.78 0.82 0.79  CALCIUM 9.2 8.2* 8.3* 8.7* 8.2*  MG  --   --   --  1.9 1.9  PHOS  --   --   --  2.7 2.0*   Estimated Creatinine Clearance: 109 mL/min (by C-G formula based on SCr of 0.79 mg/dL). Liver & Pancreas: Recent Labs  Lab 12/14/21 1540 12/17/21 0141 12/18/21 0118  AST 21  --   --   ALT 22  --   --   ALKPHOS 82  --   --   BILITOT 0.6  --   --   PROT 8.0  --   --   ALBUMIN 3.1* 2.4* 2.3*   No results for input(s): "LIPASE", "AMYLASE" in the last 168 hours. No results for input(s): "AMMONIA" in the last 168 hours. Diabetic: Recent Labs    12/17/21 0141  HGBA1C 8.0*   Recent Labs  Lab 12/17/21 1934 12/17/21 2115 12/18/21 0652 12/18/21 1056 12/18/21 1350  GLUCAP 98 156* 166* 144* 127*   Cardiac Enzymes: No results for input(s): "CKTOTAL", "CKMB", "CKMBINDEX", "TROPONINI" in the last 168 hours. No results for input(s): "PROBNP" in the last 8760 hours. Coagulation Profile: No results for input(s): "INR", "PROTIME" in the last 168 hours. Thyroid Function Tests: No results for input(s): "TSH", "T4TOTAL", "FREET4", "T3FREE", "THYROIDAB" in the last 72 hours. Lipid Profile: Recent Labs    12/17/21 0141 12/18/21 0118  CHOL 90 86  HDL 16* 14*  LDLCALC 55 43  TRIG 94 143  CHOLHDL 5.6 6.1   Anemia Panel: No results for input(s): "VITAMINB12", "FOLATE", "FERRITIN", "TIBC", "IRON", "RETICCTPCT" in the last 72 hours. Urine analysis:    Component Value Date/Time   COLORURINE YELLOW 11/05/2021 1224   APPEARANCEUR CLEAR 11/05/2021 1224   LABSPEC 1.031 (H) 11/05/2021 1224   PHURINE 5.0 11/05/2021 1224   GLUCOSEU >=500 (A) 11/05/2021 1224   HGBUR NEGATIVE 11/05/2021 1224   BILIRUBINUR NEGATIVE 11/05/2021 1224   KETONESUR NEGATIVE 11/05/2021 1224   PROTEINUR NEGATIVE  11/05/2021 1224   UROBILINOGEN 2.0 (H) 02/17/2015 2051   NITRITE NEGATIVE 11/05/2021 1224   LEUKOCYTESUR NEGATIVE 11/05/2021 1224   Sepsis Labs: Invalid input(s): "PROCALCITONIN", "LACTICIDVEN"  Microbiology: Recent Results (from the past 240 hour(s))  Blood culture (routine x 2)     Status: None (Preliminary result)   Collection Time: 12/14/21  3:40 PM   Specimen: BLOOD RIGHT HAND  Result Value Ref Range Status   Specimen Description BLOOD RIGHT HAND  Final   Special Requests   Final    BOTTLES DRAWN AEROBIC ONLY Blood Culture results may not be optimal due to an inadequate volume of blood received in culture bottles   Culture   Final    NO GROWTH 4 DAYS Performed at [facility], [place] [address] [place], [place] [address]    Report Status PENDING  Incomplete  Blood culture (routine x 2)     Status: None (Preliminary result)   Collection Time: 12/14/21  6:40 PM   Specimen: BLOOD RIGHT FOREARM  Result Value Ref Range Status   Specimen Description BLOOD RIGHT FOREARM  Final   Special Requests   Final    BOTTLES DRAWN AEROBIC AND ANAEROBIC Blood Culture adequate volume   Culture   Final    NO GROWTH 4 DAYS Performed at [facility], [place] [address] [place], [place] [address]    Report Status PENDING  Incomplete  Surgical pcr screen     Status: Abnormal   Collection Time: 12/18/21  2:44 AM   Specimen: Nasal Mucosa; Nasal Swab  Result Value Ref Range Status   MRSA, PCR POSITIVE (A) NEGATIVE Final  Staphylococcus aureus POSITIVE (A) NEGATIVE Final    Comment: RESULTS CALLED TO READ BACK BY AND VERIFIED WITH RN [email or web address] _0  ON 12/18/2021 BY NM (NOTE) The Xpert SA Assay (FDA approved for NASAL specimens in patients 36 years of age and older), is one component of a comprehensive surveillance program. It is not intended to diagnose infection nor to guide or monitor treatment. Performed at [facility], [place] [address], [place],  [address]      Radiology Studies: PERIPHERAL VASCULAR CATHETERIZATION  Result Date: 12/18/2021 Images from the original result were not included. Patient name: [name] MRN: [id number] DOB: Nov 06, 1955 Sex: male 12/17/2021 Pre-operative Diagnosis: left leg rutherford 5 critical limb ischemia Post-operative diagnosis:  Same Surgeon:  [name], MD Procedure Performed: 1.  Ultrasound-guided micropuncture access of the right common femoral artery in retrograde fashion 2.  Second order cannulation, left low extremity angiogram. 3.  Device assisted closure, mynx Indications: Patient is a 66 year old male with multiple interventions on bilateral lower extremities, most recently left lower extremity complex revascularization from both antegrade and retrograde approaches by my partner Dr. Donzetta Matters.  This resulted in superficial femoral artery stenting, single-vessel posterior tibial outflow to the foot.  He was recently admitted to the hospital with new onset foot infection.  Duplex ultrasonography demonstrated concern for high-grade stenosis in the superficial femoral artery.  After discussing the risk and benefits of diagnostic angiogram with possible intervention for assisted primary stent patency, Madden elected to proceed. Findings: Left common femoral artery patent, profunda patent, interval stents appreciated in the superficial femoral artery extending a small amount into the common femoral artery.  This does not jail the profunda.  Distally, the superficial femoral artery is lined with self-expanding stents, no flow-limiting stenosis.  The popliteal artery is without flow-limiting stenosis.  Outflow is brisk single-vessel posterior tibial runoff to the foot.  There is an area of stenosis less than 50% of the mid posterior tibial artery.  The medial and lateral plantar arteries fill in the foot, no filling of the dorsalis pedis.  The pedal arch is not intact.  Procedure:  The patient was identified in the holding  area and taken to room 8.  The patient was then placed supine on the table and prepped and draped in the usual sterile fashion.  A time out was called.  Ultrasound was used to evaluate the right common femoral artery.  It was patent .  A digital ultrasound image was acquired.  A micropuncture needle was used to access the right common femoral artery under ultrasound guidance.  An 018 wire was advanced without resistance and a micropuncture sheath was placed.  The 018 wire was removed and a benson wire was placed.  The micropuncture sheath was exchanged for a 5 french sheath.  An omniflush catheter was advanced over the aortic bifurcation into the left external iliac artery.  Left lower extremity runoff was obtained. Impression: Patient maximally revascularized.  No intervention taken. Cassandria Santee, MD [facility]: [phone]      Megyn Leng T. [place]  If 7PM-7AM, please contact night-coverage [email or web address] 12/18/2021, 2:45 PM

## 2021-12-18 NOTE — Anesthesia Postprocedure Evaluation (Signed)
Anesthesia Post Note  Patient: [name]  Procedure(s) Performed: AMPUTATION LEFT BIG TOE; LEFT 5TH RAY PARTIAL AMPUTATION (Left: Toe)     Patient location during evaluation: PACU Anesthesia Type: General Level of consciousness: awake and alert Pain management: pain level controlled Vital Signs Assessment: post-procedure vital signs reviewed and stable Respiratory status: spontaneous breathing, nonlabored ventilation and respiratory function stable Cardiovascular status: blood pressure returned to baseline and stable Postop Assessment: no apparent nausea or vomiting Anesthetic complications: no   No notable events documented.  Last Vitals:  Vitals:   12/18/21 1345 12/18/21 1401  BP:  (!) 147/79  Pulse: 84 84  Resp: 12 18  Temp:  37.5 C  SpO2: 97% 97%    Last Pain:  Vitals:   12/18/21 1401  TempSrc: Oral  PainSc:                  Markeesha Char,W. EDMOND

## 2021-12-18 NOTE — Interval H&P Note (Signed)
History and Physical Interval Note:  12/18/2021 10:22 AM  [name]  has presented today for surgery, with the diagnosis of Osteomyelitis.  The various methods of treatment have been discussed with the patient and family. After consideration of risks, benefits and other options for treatment, the patient has consented to  Procedure(s): AMPUTATION LEFT BIG TOE; LEFT 5TH RAY PARTIAL AMPUTATION (Left) as a surgical intervention.  The patient's history has been reviewed, patient examined, no change in status, stable for surgery.  I have reviewed the patient's chart and labs.  Questions were answered to the patient's satisfaction.     [name]

## 2021-12-18 NOTE — Progress Notes (Signed)
PHARMACIST LIPID MONITORING  [name] is a 66 y.o. male admitted on 12/14/2021 with dry gangrene s/p recent revascularization. Pharmacy has been consulted to optimize lipid-lowering therapy with the indication of secondary prevention for clinical ASCVD.  Recent Labs:  Lipid Panel (last 6 months):   Lab Results  Component Value Date   CHOL 86 12/18/2021   TRIG 143 12/18/2021   HDL 14 (L) 12/18/2021   CHOLHDL 6.1 12/18/2021   VLDL 29 12/18/2021   LDLCALC 43 12/18/2021    Hepatic function panel (last 6 months):   Lab Results  Component Value Date   AST 21 12/14/2021   ALT 22 12/14/2021   ALKPHOS 82 12/14/2021   BILITOT 0.6 12/14/2021    SCr (since admission):   Serum creatinine: 0.79 mg/dL 12/18/21 0118 Estimated creatinine clearance: 103.5 mL/min  Current therapy and lipid therapy tolerance Current lipid-lowering therapy: atorvastatin '80mg'$   Previous lipid-lowering therapies (if applicable): see below Documented or reported allergies or intolerances to lipid-lowering therapies (if applicable): gemfibrozil, simvastatin  Assessment:   Patient currently at goal LDL. No changes needed at this time. Patient agrees with changes to lipid-lowering therapy  Plan:    1.Statin intensity (high intensity recommended for all patients regardless of the LDL):  No statin changes. The patient is already on a high intensity statin.  2.Add ezetimibe (if any one of the following):   Not indicated at this time.  3.Refer to lipid clinic:   No  4.Follow-up with:  Cardiology provider - None  5.Follow-up labs after discharge:  No changes in lipid therapy, repeat a lipid panel in one year.      Thank you for allowing pharmacy to be a part of this patient's care.  Ardyth Harps, PharmD Clinical Pharmacist

## 2021-12-18 NOTE — Progress Notes (Signed)
  Progress Note    12/18/2021 7:18 AM 1 Day Post-Op  Subjective:  Patient says he feels tired and his diabetic neuropathy is causing him pain in his feet.   Vitals:   12/18/21 0427 12/18/21 0552  BP: 126/66   Pulse: 89 86  Resp: 17   Temp: (!) 103.3 F (39.6 C) 100 F (37.8 C)  SpO2: 96% 96%    Physical Exam: Cardiac:  regular Lungs:  nonlabored Incisions:  right groin incision intact, covered with gauze. No bleeding. Extremities:  bilateral feet dressed in gauze and ace wraps. 2+ femoral pulses bilaterally. Left PT doppler signal and right AT doppler.   CBC    Component Value Date/Time   WBC 14.3 (H) 12/18/2021 0118   RBC 4.47 12/18/2021 0118   HGB 11.1 (L) 12/18/2021 0118   HCT 34.8 (L) 12/18/2021 0118   PLT 649 (H) 12/18/2021 0118   MCV 77.9 (L) 12/18/2021 0118   MCH 24.8 (L) 12/18/2021 0118   MCHC 31.9 12/18/2021 0118   RDW 15.0 12/18/2021 0118   LYMPHSABS 1.7 12/14/2021 1540   MONOABS 1.9 (H) 12/14/2021 1540   EOSABS 0.1 12/14/2021 1540   BASOSABS 0.1 12/14/2021 1540    BMET    Component Value Date/Time   NA 133 (L) 12/18/2021 0118   NA 135 (A) 10/07/2021 0000   K 4.0 12/18/2021 0118   CL 102 12/18/2021 0118   CO2 20 (L) 12/18/2021 0118   GLUCOSE 205 (H) 12/18/2021 0118   BUN 11 12/18/2021 0118   BUN 12 10/07/2021 0000   CREATININE 0.79 12/18/2021 0118   CALCIUM 8.2 (L) 12/18/2021 0118   GFRNONAA >60 12/18/2021 0118   GFRAA >60 02/17/2015 2030    INR No results found for: "INR"   Intake/Output Summary (Last 24 hours) at 12/18/2021 0718 Last data filed at 12/18/2021 0450 Gross per 24 hour  Intake 2728.74 ml  Output 800 ml  Net 1928.74 ml     Assessment/Plan:  66 y.o. male is 1 day post op, s/p: angiogram with lower extremity runoff. Full dictation awaiting procedure note by Dr.Robins.   -Stable dry gangrene of left GT and 5th toe ulcer with osteomyelitis -Palpable 2+ femoral pulses with left PT doppler signal and right AT. -Right  groin incision intact and covered with gauze dressing -Bilateral feet dressed in gauze -Going to OR today with podiatry for left partial 5th ray amputation and partial hallux amputation    Vicente Serene, PA-C [facility] [phone] 12/18/2021 7:18 AM

## 2021-12-18 NOTE — Anesthesia Procedure Notes (Signed)
Procedure Name: LMA Insertion Date/Time: 12/18/2021 12:32 PM  Performed by: Cathren Harsh, CRNAPre-anesthesia Checklist: Patient identified, Emergency Drugs available, Suction available and Patient being monitored Patient Re-evaluated:Patient Re-evaluated prior to induction Oxygen Delivery Method: Circle System Utilized Preoxygenation: Pre-oxygenation with 100% oxygen Induction Type: IV induction Ventilation: Mask ventilation without difficulty LMA: LMA inserted LMA Size: 4.0 Number of attempts: 1 Airway Equipment and Method: Bite block Placement Confirmation: positive ETCO2 Tube secured with: Tape Dental Injury: Teeth and Oropharynx as per pre-operative assessment

## 2021-12-18 NOTE — Op Note (Signed)
Surgeon: Surgeon(s): [name], DPM  Assistants: None Pre-operative diagnosis: Osteomyelitis  Post-operative diagnosis: same Procedure: Procedure(s) (LRB): AMPUTATION LEFT BIG TOE; LEFT 5TH RAY PARTIAL AMPUTATION (Left)  Pathology:  ID Type Source Tests Collected by Time Destination  1 : Fifth Ray Toe, Left Hallux Amputation Toe, Left SURGICAL PATHOLOGY [name], DPM 12/18/2021 1200   2 : bone left fifth metarsal Tissue Bone SURGICAL PATHOLOGY [name], DPM 12/18/2021 1249   A : left fifth Toe Tissue Soft Tissue, Other AEROBIC/ANAEROBIC CULTURE W Lonell Grandchild STAIN (SURGICAL/DEEP WOUND) [name], DPM 12/18/2021 1248     Pertinent Intra-op findings: Osteomyelitis noted of the left hallux.  Significant purulent drainage as well as osteomyelitis noted of the fifth metatarsal.  Multiple cultures taken packed open Anesthesia: General  Hemostasis: * No tourniquets in log * EBL: 25 mL  Materials: 3-0 Prolene.  Iodoform packing to the lateral side Injectables: None Complications: None  Indications for surgery: A 66 y.o. male presents with left foot osteomyelitis at the hallux and fifth metatarsal. Patient has failed all conservative therapy including but not limited to local wound care and IV antibiotics. He wishes to have surgical correction of the foot/deformity. It was determined that patient would benefit from left hallux partial amputation with closure and left partial fifth ray amputation packed open. Informed surgical risk consent was reviewed and read aloud to the patient.  I reviewed the films.  I have discussed my findings with the patient in great detail.  I have discussed all risks including but not limited to infection, stiffness, scarring, limp, disability, deformity, damage to blood vessels and nerves, numbness, poor healing, need for braces, arthritis, chronic pain, amputation, death.  All benefits and realistic expectations discussed in great detail.  I have made no  promises as to the outcome.  I have provided realistic expectations.  I have offered the patient a 2nd opinion, which they have declined and assured me they preferred to proceed despite the risks   Procedure in detail: The patient was both verbally and visually identified by myself, the nursing staff, and anesthesia staff in the preoperative holding area. They were then transferred to the operating room and placed on the operative table in supine position.  Attention was directed to the  left great toe, a fishmouth style incision was delineated.  Using #15 blade the incision was carried down to the level of the epidermis dermal down to the level of the bone.  The toe was disarticulated at the distal interphalangeal joint followed by sagittal saw was used to resect head in the middle part of the proximal phalanx.  This was all sent to pathology in standard technique.  No further osteomyelitic changes noted at the great toe.  Hard indurated bone noted.  The wound was thoroughly irrigated and closed with 3-0 Prolene in simple interrupted suture technique.  Attention was directed to the left lateral foot at fifth metatarsal digit and metatarsal, a skin marker was used to the needed fishmouth style incision to encompass the fifth digit as well as the fifth metatarsal partially.  Using #15 blade incision was carried down for dermatomal junction down to the level of the bone.  Partial fifth ray amputation was performed using sagittal saw.  This was sent to pathology in standard technique.  It is important note that a significant amount of purulent drainage was noted a culture was obtained.  Multiple cultures were taken including base of the fifth metatarsal.  The wound was thoroughly  irrigated with normal saline solution.  Further debridement was carried out.  No further purulent drainage noted however it was made decision the patient will benefit from staged procedure I will plan to bring her back on Saturday for  further debridement with possible wound VAC closure.  The wound was packed with iodoform packing.  We will plan on removing it on Saturday.  Disposition  Return to the operating room on Saturday for further debridement and wound VAC closure versus primary closure.  Nonweightbearing to the left lower extremity.  Continue IV antibiotics  At the conclusion of the procedure the patient was awoken from anesthesia and found to have tolerated the procedure well any complications. There were transferred to PACU with vital signs stable and vascular status intact.  Boneta Lucks, DPM

## 2021-12-18 NOTE — Progress Notes (Signed)
Patient had a fever of 103.3 F. Gave 650 mg of Tylenol at 0432.  Will continue to monitor.  Lupita Dawn, RN

## 2021-12-19 ENCOUNTER — Encounter (HOSPITAL_COMMUNITY): Payer: Self-pay | Admitting: Vascular Surgery

## 2021-12-19 ENCOUNTER — Inpatient Hospital Stay (HOSPITAL_COMMUNITY): Payer: Medicare HMO

## 2021-12-19 DIAGNOSIS — I96 Gangrene, not elsewhere classified: Secondary | ICD-10-CM | POA: Diagnosis not present

## 2021-12-19 DIAGNOSIS — E11621 Type 2 diabetes mellitus with foot ulcer: Secondary | ICD-10-CM | POA: Diagnosis not present

## 2021-12-19 DIAGNOSIS — E876 Hypokalemia: Secondary | ICD-10-CM

## 2021-12-19 DIAGNOSIS — M86142 Other acute osteomyelitis, left hand: Secondary | ICD-10-CM | POA: Diagnosis not present

## 2021-12-19 DIAGNOSIS — M86172 Other acute osteomyelitis, left ankle and foot: Secondary | ICD-10-CM | POA: Diagnosis not present

## 2021-12-19 LAB — CBC
HCT: 31.5 % — ABNORMAL LOW (ref 39.0–52.0)
Hemoglobin: 10.1 g/dL — ABNORMAL LOW (ref 13.0–17.0)
MCH: 24.9 pg — ABNORMAL LOW (ref 26.0–34.0)
MCHC: 32.1 g/dL (ref 30.0–36.0)
MCV: 77.8 fL — ABNORMAL LOW (ref 80.0–100.0)
Platelets: 618 10*3/uL — ABNORMAL HIGH (ref 150–400)
RBC: 4.05 MIL/uL — ABNORMAL LOW (ref 4.22–5.81)
RDW: 15 % (ref 11.5–15.5)
WBC: 16 10*3/uL — ABNORMAL HIGH (ref 4.0–10.5)
nRBC: 0 % (ref 0.0–0.2)

## 2021-12-19 LAB — RENAL FUNCTION PANEL
Albumin: 1.9 g/dL — ABNORMAL LOW (ref 3.5–5.0)
Anion gap: 9 (ref 5–15)
BUN: 7 mg/dL — ABNORMAL LOW (ref 8–23)
CO2: 22 mmol/L (ref 22–32)
Calcium: 8.2 mg/dL — ABNORMAL LOW (ref 8.9–10.3)
Chloride: 104 mmol/L (ref 98–111)
Creatinine, Ser: 0.76 mg/dL (ref 0.61–1.24)
GFR, Estimated: >60 mL/min (ref >60)
Glucose, Bld: 165 mg/dL — ABNORMAL HIGH (ref 70–99)
Phosphorus: 2.1 mg/dL — ABNORMAL LOW (ref 2.5–4.6)
Potassium: 3.4 mmol/L — ABNORMAL LOW (ref 3.5–5.1)
Sodium: 135 mmol/L (ref 135–145)

## 2021-12-19 LAB — GLUCOSE, CAPILLARY
Glucose-Capillary: 155 mg/dL — ABNORMAL HIGH (ref 70–99)
Glucose-Capillary: 157 mg/dL — ABNORMAL HIGH (ref 70–99)
Glucose-Capillary: 237 mg/dL — ABNORMAL HIGH (ref 70–99)
Glucose-Capillary: 185 mg/dL — ABNORMAL HIGH (ref 70–99)

## 2021-12-19 LAB — CULTURE, BLOOD (ROUTINE X 2)
Culture: NO GROWTH
Culture: NO GROWTH
Special Requests: ADEQUATE

## 2021-12-19 LAB — MAGNESIUM: Magnesium: 2 mg/dL (ref 1.7–2.4)

## 2021-12-19 MED ORDER — VANCOMYCIN HCL 1250 MG/250ML IV SOLN
1250.0000 mg | Freq: Two times a day (BID) | INTRAVENOUS | Status: DC
  Administered 2021-12-20 (×2): 1250 mg via INTRAVENOUS
  Filled 2021-12-19 (×4): qty 250

## 2021-12-19 MED ORDER — POTASSIUM PHOSPHATES 15 MMOLE/5ML IV SOLN
15.0000 mmol | Freq: Once | INTRAVENOUS | Status: AC
  Administered 2021-12-19: 15 mmol via INTRAVENOUS
  Filled 2021-12-19: qty 5

## 2021-12-19 MED ORDER — POTASSIUM CHLORIDE CRYS ER 20 MEQ PO TBCR
40.0000 meq | EXTENDED_RELEASE_TABLET | Freq: Once | ORAL | Status: AC
  Administered 2021-12-19: 40 meq via ORAL
  Filled 2021-12-19: qty 2

## 2021-12-19 NOTE — Progress Notes (Signed)
Pt arrived to unit, assessed, informed of POC, all questions answered

## 2021-12-19 NOTE — Progress Notes (Addendum)
PROGRESS NOTE  [name] OMV:[id number] DOB: 19-Jul-1955   PCP: Harvie Junior, MD  Patient is from: Home.  Lives with fianc.  Has rolling walker  DOA: 12/14/2021 LOS: 5  Chief complaints Chief Complaint  Patient presents with   Wound Infection     Brief Narrative / Interim history: 66 year old M with PMH of IDDM-2 with neuropathy and other complications, PVD s/p right below-knee popliteal artery to PTA bypass and right TMT amputation on 09/19/2021 and s/p left PTA PCI and stent to left SFA on 12/08/2021, COPD, OSA not on CPAP, HTN, HLD, GERD and depression presented with new pain and drainage from left small toe, subjective fever and decreased appetite.  He was on p.o. Keflex after recent hospitalization, which was discontinued by ID 4 days prior to presentation.  He was admitted with gangrenous left foot infection and PAD.  He was started on IV vancomycin, CTX and Flagyl.  Vascular surgery and podiatry consulted.    X-ray with subcutaneous soft tissue gas about left fifth metatarsal and metatarsophalangeal joint concerning for gas-forming infection.  MRI of left foot without contrast shows soft tissue ulceration and findings suggestive for acute osteomyelitis of fifth metatarsal head, fifth metatarsal shaft, distal phalanx of left great toe and mild extensor digitorum brevis tenosynovitis.   LE Doppler on 6/12 concerning for some stenosis in SFA, DFA and PT with monophasic flow throughout.  ABI with moderate right and mild left PAD improved from prior ABI.  Had aortogram on 6/14 that showed maximal revascularization without intervention.  Underwent left big toe amputation and left 3 partial amputation by Dr. Posey Pronto on 6/15.  Plan to return to the OR on 6/17  Subjective: Seen and examined earlier this morning.  No major events overnight of this morning.  Complaining of pain at surgical site.  He rates pain 7/10.  No other concerns.  Objective: Vitals:   12/19/21 0017 12/19/21 0424  12/19/21 0816 12/19/21 1149  BP: 125/69 (!) 143/72 136/72 (!) 147/78  Pulse: 78 83 81   Resp: 18 20 18 18   Temp: 99.6 F (37.6 C) 100.1 F (37.8 C) 98.4 F (36.9 C) 98.5 F (36.9 C)  TempSrc: Oral Oral Oral Oral  SpO2: 99% 95% 93% 98%  Weight:  95.1 kg    Height:        Examination:  GENERAL: No apparent distress.  Nontoxic. HEENT: MMM.  Vision and hearing grossly intact.  NECK: Supple.  No apparent JVD.  RESP:  No IWOB.  Fair aeration bilaterally. CVS:  RRR. Heart sounds normal.  ABD/GI/GU: BS+. Abd soft, NTND.  MSK/EXT:  Moves extremities. S/p right TMT amputation.  Dressing over left and right feet DCI. SKIN: no apparent skin lesion or wound NEURO: Awake and alert. Oriented appropriately.  No apparent focal neuro deficit. PSYCH: Calm. Normal affect.   Procedures:  None  Microbiology summarized: Blood cultures NGTD. Surgical MRSA PCR screen positive. Surgical tissue culture pending. GS with abundant GPC in pairs and GNR  Assessment and plan: Principal Problem:   Gangrene of left foot ([place]) Active Problems:   Acute osteomyelitis of phalanx of foot, left (HCC)   Acute osteomyelitis of metacarpal bone, left (HCC)   Diabetic foot ulcer with osteomyelitis (HCC)   Severe sepsis (HCC)   Tenosynovitis of left foot   PAD (peripheral artery disease) ([place])   AKI (acute kidney injury) ([place])   Type 2 diabetes mellitus ([place])   Hypertension associated with diabetes ([place])   Depression   GERD  without esophagitis   Hyperlipidemia associated with type 2 diabetes mellitus (HCC)   S/P transmetatarsal amputation of foot, right (HCC)   Normocytic anemia   Bandemia   Thrombocytosis   Hyponatremia  Severe sepsis: POA.  Had tachycardia, leukocytosis, lactic acidosis, fever and AKI.  Acute osteomyelitis of left foot  Diabetic foot ulcer with osteomyelitis in patient with uncontrolled IDDM-2 Tenosynovitis of left foot Left great toe gangrenous change Bilateral peripheral artery  disease -X-ray, MRI, LE arterial duplex and ABI as above. -CRP 26.4.  ESR 139.  Blood cultures NGTD.  Surgical MRSA screen PCR positive. -6/14-aortogram with maximally revascularized LLE without intervention -6/15-left big toe amputation and left 3 partial amputation by Dr. Posey Pronto  -Continue vancomycin, cefepime and Flagyl pending surgical culture speciation -Continue Lipitor, Plavix and aspirin -Increased gabapentin to 300 mg in the morning and afternoon and 600 mg at night on 6/15. -Further pain control per podiatry. -Plan for return to the OR on 6/17 with podiatry  Uncontrolled IDDM-2 with neuropathy, PAD, ulcer infection with osteomyelitis and hyperglycemia A1c 8.0%. Recent Labs  Lab 12/18/21 1558 12/18/21 2126 12/19/21 0553 12/19/21 1044 12/19/21 1153  GLUCAP 174* 219* 155* 157* 237*  -Increase SSI to moderate -Continue Semglee 10 units twice daily -Continue Lipitor 80 mg daily -Increased gabapentin as above.   AKI: Resolved. Recent Labs    10/07/21 0000 11/05/21 1223 12/03/21 1244 12/08/21 1336 12/14/21 1540 12/15/21 0448 12/16/21 0123 12/17/21 0141 12/18/21 0118 12/19/21 0754  BUN 12 19 9 13 19 15 11 12 11  7*  CREATININE 0.7 0.98 0.80 0.80 1.32* 0.96 0.78 0.82 0.79 0.76  -Monitor   Normocytic anemia: Stable after initial drop. Recent Labs    11/05/21 1223 12/03/21 1244 12/08/21 1336 12/14/21 1540 12/15/21 0448 12/16/21 0123 12/17/21 0141 12/17/21 2019 12/18/21 0118 12/19/21 0754  HGB 13.4 16.0 12.6* 12.5* 10.6* 10.6* 10.8* 12.0* 11.1* 10.1*  -Continue monitoring  Hypertension associated with diabetes: BP within acceptable range. -Continue home amlodipine and Lopressor. -Continue holding lisinopril   Depression/mood disorder: Stable. -Continue home bupropion, Latuda, Trileptal, Zyprexa.   Hyperlipidemia associated with type 2 diabetes mellitus (HCC) -Continue atorvastatin.  OSA not on CPAP  Thrombocytosis/bandemia: likely due to  sepsis. -Continue monitoring  Hyponatremia: Mild. -Continue monitoring  Hypokalemia and hypophosphatemia -Monitor replenish as appropriate  Bandemia: Likely related to sepsis. -Continue monitoring  Obesity: BMI 31.14. -Encourage lifestyle change to lose weight -May benefit from GLP-1 agonist.  GERD: -Continue PPI  DVT prophylaxis:  heparin injection 5,000 Units Start: 12/14/21 2200  Code Status: Full code Family Communication: None at bedside Level of care: Med-Surg Status is: Inpatient Remains inpatient appropriate because: Due to diabetic foot ulcer infection with osteomyelitis requiring IV antibiotics, further evaluation and surgical intervention   Final disposition: TBD Consultants:  Vascular surgery-signed off Podiatry  Sch Meds:  Scheduled Meds:  (feeding supplement) PROSource Plus  30 mL Oral Daily   amLODipine  5 mg Oral q morning   aspirin EC  81 mg Oral Daily   atorvastatin  80 mg Oral QHS   buPROPion  300 mg Oral q morning   Chlorhexidine Gluconate Cloth  6 each Topical Q0600   cholecalciferol  1,000 Units Oral Daily   clopidogrel  75 mg Oral Daily   feeding supplement  237 mL Oral BID BM   fluticasone furoate-vilanterol  1 puff Inhalation Daily   gabapentin  300 mg Oral [id number]   And   gabapentin  600 mg Oral QHS   heparin  5,000 Units  Subcutaneous Q8H   insulin aspart  0-5 Units Subcutaneous QHS   insulin aspart  0-9 Units Subcutaneous TID WC   insulin glargine-yfgn  10 Units Subcutaneous BID   lurasidone  40 mg Oral Q supper   metoprolol tartrate  50 mg Oral BID   mupirocin ointment  1 application  Nasal BID   OLANZapine  2.5 mg Oral QHS   OXcarbazepine  150 mg Oral BID   pantoprazole  40 mg Oral QHS   potassium chloride  40 mEq Oral Once   sodium chloride flush  3 mL Intravenous Q12H   Continuous Infusions:  sodium chloride 100 mL/hr at 12/19/21 0815   sodium chloride     ceFEPime (MAXIPIME) IV 2 g (12/19/21 1324)   metronidazole 500  mg (12/19/21 1011)   potassium PHOSPHATE IVPB (in mmol)     vancomycin     PRN Meds:.sodium chloride, acetaminophen **OR** acetaminophen, albuterol, hydrALAZINE, HYDROcodone-acetaminophen, labetalol, meclizine, ondansetron **OR** ondansetron (ZOFRAN) IV, ondansetron (ZOFRAN) IV, oxyCODONE, senna-docusate, sodium chloride flush  Antimicrobials: Anti-infectives (From admission, onward)    Start     Dose/Rate Route Frequency Ordered Stop   12/19/21 2200  vancomycin (VANCOREADY) IVPB 1250 mg/250 mL        1,250 mg 166.7 mL/hr over 90 Minutes Intravenous Every 12 hours 12/19/21 1040     12/16/21 1400  ceFEPIme (MAXIPIME) 2 g in sodium chloride 0.9 % 100 mL IVPB        2 g 200 mL/hr over 30 Minutes Intravenous Every 8 hours 12/16/21 1259     12/16/21 0200  vancomycin (VANCOREADY) IVPB 1500 mg/300 mL  Status:  Discontinued        1,500 mg 150 mL/hr over 120 Minutes Intravenous Every 24 hours 12/15/21 1108 12/19/21 1040   12/15/21 2200  vancomycin (VANCOREADY) IVPB 1250 mg/250 mL  Status:  Discontinued        1,250 mg 166.7 mL/hr over 90 Minutes Intravenous Every 24 hours 12/14/21 2022 12/15/21 1108   12/14/21 2100  metroNIDAZOLE (FLAGYL) IVPB 500 mg        500 mg 100 mL/hr over 60 Minutes Intravenous Every 12 hours 12/14/21 2022     12/14/21 2030  vancomycin (VANCOREADY) IVPB 2000 mg/400 mL        2,000 mg 200 mL/hr over 120 Minutes Intravenous  Once 12/14/21 2015 12/15/21 0516   12/14/21 2000  cefTRIAXone (ROCEPHIN) 2 g in sodium chloride 0.9 % 100 mL IVPB  Status:  Discontinued        2 g 200 mL/hr over 30 Minutes Intravenous Every 24 hours 12/14/21 1948 12/16/21 1242   12/14/21 2000  metroNIDAZOLE (FLAGYL) IVPB 500 mg  Status:  Discontinued        500 mg 100 mL/hr over 60 Minutes Intravenous Every 8 hours 12/14/21 1948 12/14/21 2022        I have personally reviewed the following labs and images: CBC: Recent Labs  Lab 12/14/21 1540 12/15/21 0448 12/16/21 0123 12/17/21 0141  12/17/21 2019 12/18/21 0118 12/19/21 0754  WBC 16.8*   < > 14.1* 13.6* 14.1* 14.3* 16.0*  NEUTROABS 13.0*  --   --   --   --   --   --   HGB 12.5*   < > 10.6* 10.8* 12.0* 11.1* 10.1*  HCT 40.1   < > 33.5* 32.0* 36.3* 34.8* 31.5*  MCV 82.2   < > 79.0* 77.3* 76.9* 77.9* 77.8*  PLT 598*   < > 589* 631* 551* 649*  618*   < > = values in this interval not displayed.   BMP &GFR Recent Labs  Lab 12/15/21 0448 12/16/21 0123 12/17/21 0141 12/18/21 0118 12/19/21 0754  NA 134* 133* 134* 133* 135  K 4.0 4.0 4.0 4.0 3.4*  CL 101 103 99 102 104  CO2 21* 22 22 20* 22  GLUCOSE 198* 161* 146* 205* 165*  BUN 15 11 12 11  7*  CREATININE 0.96 0.78 0.82 0.79 0.76  CALCIUM 8.2* 8.3* 8.7* 8.2* 8.2*  MG  --   --  1.9 1.9 2.0  PHOS  --   --  2.7 2.0* 2.1*   Estimated Creatinine Clearance: 105.8 mL/min (by C-G formula based on SCr of 0.76 mg/dL). Liver & Pancreas: Recent Labs  Lab 12/14/21 1540 12/17/21 0141 12/18/21 0118 12/19/21 0754  AST 21  --   --   --   ALT 22  --   --   --   ALKPHOS 82  --   --   --   BILITOT 0.6  --   --   --   PROT 8.0  --   --   --   ALBUMIN 3.1* 2.4* 2.3* 1.9*   No results for input(s): "LIPASE", "AMYLASE" in the last 168 hours. No results for input(s): "AMMONIA" in the last 168 hours. Diabetic: Recent Labs    12/17/21 0141  HGBA1C 8.0*   Recent Labs  Lab 12/18/21 1558 12/18/21 2126 12/19/21 0553 12/19/21 1044 12/19/21 1153  GLUCAP 174* 219* 155* 157* 237*   Cardiac Enzymes: No results for input(s): "CKTOTAL", "CKMB", "CKMBINDEX", "TROPONINI" in the last 168 hours. No results for input(s): "PROBNP" in the last 8760 hours. Coagulation Profile: No results for input(s): "INR", "PROTIME" in the last 168 hours. Thyroid Function Tests: No results for input(s): "TSH", "T4TOTAL", "FREET4", "T3FREE", "THYROIDAB" in the last 72 hours. Lipid Profile: Recent Labs    12/17/21 0141 12/18/21 0118  CHOL 90 86  HDL 16* 14*  LDLCALC 55 43  TRIG 94 143   CHOLHDL 5.6 6.1   Anemia Panel: No results for input(s): "VITAMINB12", "FOLATE", "FERRITIN", "TIBC", "IRON", "RETICCTPCT" in the last 72 hours. Urine analysis:    Component Value Date/Time   COLORURINE YELLOW 11/05/2021 1224   APPEARANCEUR CLEAR 11/05/2021 1224   LABSPEC 1.031 (H) 11/05/2021 1224   PHURINE 5.0 11/05/2021 1224   GLUCOSEU >=500 (A) 11/05/2021 1224   HGBUR NEGATIVE 11/05/2021 1224   BILIRUBINUR NEGATIVE 11/05/2021 1224   KETONESUR NEGATIVE 11/05/2021 1224   PROTEINUR NEGATIVE 11/05/2021 1224   UROBILINOGEN 2.0 (H) 02/17/2015 2051   NITRITE NEGATIVE 11/05/2021 1224   LEUKOCYTESUR NEGATIVE 11/05/2021 1224   Sepsis Labs: Invalid input(s): "PROCALCITONIN", "LACTICIDVEN"  Microbiology: Recent Results (from the past 240 hour(s))  Blood culture (routine x 2)     Status: None   Collection Time: 12/14/21  3:40 PM   Specimen: BLOOD RIGHT HAND  Result Value Ref Range Status   Specimen Description BLOOD RIGHT HAND  Final   Special Requests   Final    BOTTLES DRAWN AEROBIC ONLY Blood Culture results may not be optimal due to an inadequate volume of blood received in culture bottles   Culture   Final    NO GROWTH 5 DAYS Performed at [facility], [place] [address], [place], [place] [address]    Report Status 12/19/2021 FINAL  Final  Blood culture (routine x 2)     Status: None   Collection Time: 12/14/21  6:40 PM  Specimen: BLOOD RIGHT FOREARM  Result Value Ref Range Status   Specimen Description BLOOD RIGHT FOREARM  Final   Special Requests   Final    BOTTLES DRAWN AEROBIC AND ANAEROBIC Blood Culture adequate volume   Culture   Final    NO GROWTH 5 DAYS Performed at [facility], [address], [place], [place] [address]    Report Status 12/19/2021 FINAL  Final  Surgical pcr screen     Status: Abnormal   Collection Time: 12/18/21  2:44 AM   Specimen: Nasal Mucosa; Nasal Swab  Result Value Ref Range Status   MRSA, PCR POSITIVE (A) NEGATIVE Final    Staphylococcus aureus POSITIVE (A) NEGATIVE Final    Comment: RESULTS CALLED TO READ BACK BY AND VERIFIED WITH RN [email or web address] @0722  ON 12/18/2021 BY NM (NOTE) The Xpert SA Assay (FDA approved for NASAL specimens in patients 93 years of age and older), is one component of a comprehensive surveillance program. It is not intended to diagnose infection nor to guide or monitor treatment. Performed at [facility], [place] [address] [place], [place] [address]   Aerobic/Anaerobic Culture w Gram Stain (surgical/deep wound)     Status: None (Preliminary result)   Collection Time: 12/18/21 12:48 PM   Specimen: Soft Tissue, Other  Result Value Ref Range Status   Specimen Description TISSUE  Final   Special Requests LEFT FIFTH TOE  Final   Gram Stain   Final    ABUNDANT WBC PRESENT, PREDOMINANTLY PMN ABUNDANT GRAM POSITIVE COCCI IN PAIRS ABUNDANT GRAM NEGATIVE RODS Performed at [facility], [address], [place], [place] [address]    Culture Emmit Pomfret NEGATIVE RODS  Final   Report Status PENDING  Incomplete    Radiology Studies: DG Foot Complete Left  Result Date: 12/19/2021 CLINICAL DATA:  Status post foot surgery. EXAM: LEFT FOOT - COMPLETE 3+ VIEW COMPARISON:  Left foot radiographs 12/14/2021 and MRI left foot 12/15/2021 i FINDINGS: Interval amputation of fifth digit to the level of the proximal metatarsal shaft. Interval amputation of the great toe to the level of the proximal metaphysis of the proximal phalanx. Likely wound VAC material overlying the distal lateral amputation site. The postsurgical bone margins are sharp. No acute fracture or dislocation. IMPRESSION: Interval amputation of the sites of osteomyelitis seen on prior MRI including the fifth ray to the level of the proximal metatarsal shaft and the great toe to the level of the proximal metaphysis of the proximal phalanx. Electronically Signed   By: [name] M.D.   On: 12/19/2021 09:09      Melquiades Kovar T. [place]  If 7PM-7AM, please contact night-coverage [email or web address] 12/19/2021, 3:20 PM

## 2021-12-19 NOTE — Progress Notes (Signed)
Pharmacy Antibiotic Note  [name] is a 66 y.o. male admitted on 6/11 with foul smelling left great toe with drainage.  Patient underwent R TM amputation on 3/17 and stenting to L SFA on 6/05. Pharmacy has been consulted for vancomycin and cefepime dosing.  Patient is also on Flagyl. Scr has improved. MRI reported osteomyelitis.  Plan: Increase to Vancomycin '1250mg'$  IV Q12h (eAUC 430, goal AUC 400-550, Scr 0.76, Vd 0.72) Cefepime 2g Q8h  Trend WBC, fever, renal function F/u cultures, clinical progress, levels as indicated De-escalate when able   Height: 6' (182.9 cm) Weight: 95.1 kg (209 lb 10.5 oz) IBW/kg (Calculated) : 77.6  Temp (24hrs), Avg:99.4 F (37.4 C), Min:98.4 F (36.9 C), Max:100.1 F (37.8 C)  Recent Labs  Lab 12/14/21 1540 12/14/21 1740 12/15/21 0448 12/16/21 0123 12/17/21 0141 12/17/21 2019 12/18/21 0118 12/19/21 0754  WBC 16.8*  --  16.8* 14.1* 13.6* 14.1* 14.3* 16.0*  CREATININE 1.32*  --  0.96 0.78 0.82  --  0.79 0.76  LATICACIDVEN 2.8* 1.8  --   --   --   --   --   --     Estimated Creatinine Clearance: 105.8 mL/min (by C-G formula based on SCr of 0.76 mg/dL).    Allergies  Allergen Reactions   Gemfibrozil Other (See Comments)    Doesn't know its been awhile   Oxycontin [Oxycodone Hcl] Other (See Comments)    Bad reaction "took me to a place I never wanna go again"   Pork-Derived Products    Simvastatin Other (See Comments)    Says he doesn't know its been awhile    Antimicrobials this admission: Ceftriaxone 6/11 >> 6/12 Metronidazole 6/12 >>  Vancomycin 6/12 >>  Cefepime 6/13 >>    Microbiology results: 6/11 BCx: NGTD 6/15 Surgical Cx: GPCs, GNRs (prelim) 6/15 MRSA PCR: positive   Thank you for allowing pharmacy to be a part of this patient's care.  Ardyth Harps, PharmD Clinical Pharmacist

## 2021-12-19 NOTE — Progress Notes (Signed)
Report called to [facility] on [place] . Patient transfer to 2w 13 alert and rin. With no complaints. With belongings.

## 2021-12-19 NOTE — Progress Notes (Signed)
  Progress Note    12/19/2021 8:15 AM 1 Day Post-Op  Subjective:  Feeling good   Vitals:   12/19/21 0017 12/19/21 0424  BP: 125/69 (!) 143/72  Pulse: 78 83  Resp: 18 20  Temp: 99.6 F (37.6 C) 100.1 F (37.8 C)  SpO2: 99% 95%   Physical Exam: Cardiac:  regular Lungs:  non labored Extremities:  Bilateral feet dressed. 2+ femoral pulses bilaterally, soft  Neurologic: alert and oriented  CBC    Component Value Date/Time   WBC 14.3 (H) 12/18/2021 0118   RBC 4.47 12/18/2021 0118   HGB 11.1 (L) 12/18/2021 0118   HCT 34.8 (L) 12/18/2021 0118   PLT 649 (H) 12/18/2021 0118   MCV 77.9 (L) 12/18/2021 0118   MCH 24.8 (L) 12/18/2021 0118   MCHC 31.9 12/18/2021 0118   RDW 15.0 12/18/2021 0118   LYMPHSABS 1.7 12/14/2021 1540   MONOABS 1.9 (H) 12/14/2021 1540   EOSABS 0.1 12/14/2021 1540   BASOSABS 0.1 12/14/2021 1540    BMET    Component Value Date/Time   NA 133 (L) 12/18/2021 0118   NA 135 (A) 10/07/2021 0000   K 4.0 12/18/2021 0118   CL 102 12/18/2021 0118   CO2 20 (L) 12/18/2021 0118   GLUCOSE 205 (H) 12/18/2021 0118   BUN 11 12/18/2021 0118   BUN 12 10/07/2021 0000   CREATININE 0.79 12/18/2021 0118   CALCIUM 8.2 (L) 12/18/2021 0118   GFRNONAA >60 12/18/2021 0118   GFRAA >60 02/17/2015 2030    INR No results found for: "INR"   Intake/Output Summary (Last 24 hours) at 12/19/2021 0815 Last data filed at 12/19/2021 0657 Gross per 24 hour  Intake 2200 ml  Output 2175 ml  Net 25 ml      Assessment/Plan:  66 y.o. male with dry gangrene of left GT and 5th toe ulcer with osteo s/p recent revascularization  Will schedule outpt follow up with our office. Appreciate multidisciplinary approach for limb salvage.    Cassandria Santee, MD [facility] Phone Number: [phone] 12/19/2021 8:15 AM

## 2021-12-20 ENCOUNTER — Inpatient Hospital Stay (HOSPITAL_COMMUNITY): Payer: Medicare HMO | Admitting: Certified Registered Nurse Anesthetist

## 2021-12-20 ENCOUNTER — Encounter (HOSPITAL_COMMUNITY): Payer: Self-pay | Admitting: Internal Medicine

## 2021-12-20 ENCOUNTER — Encounter (HOSPITAL_COMMUNITY)
Admission: EM | Disposition: A | Discharge status: Skilled Nursing Facility | Payer: Self-pay | Source: Home / Self Care | Attending: Student

## 2021-12-20 DIAGNOSIS — M869 Osteomyelitis, unspecified: Secondary | ICD-10-CM | POA: Diagnosis not present

## 2021-12-20 DIAGNOSIS — I10 Essential (primary) hypertension: Secondary | ICD-10-CM

## 2021-12-20 DIAGNOSIS — E11621 Type 2 diabetes mellitus with foot ulcer: Secondary | ICD-10-CM | POA: Diagnosis not present

## 2021-12-20 DIAGNOSIS — Z794 Long term (current) use of insulin: Secondary | ICD-10-CM | POA: Diagnosis not present

## 2021-12-20 DIAGNOSIS — B9620 Unspecified Escherichia coli [E. coli] as the cause of diseases classified elsewhere: Secondary | ICD-10-CM

## 2021-12-20 DIAGNOSIS — M86172 Other acute osteomyelitis, left ankle and foot: Secondary | ICD-10-CM | POA: Diagnosis not present

## 2021-12-20 DIAGNOSIS — I96 Gangrene, not elsewhere classified: Secondary | ICD-10-CM

## 2021-12-20 DIAGNOSIS — E1169 Type 2 diabetes mellitus with other specified complication: Secondary | ICD-10-CM

## 2021-12-20 DIAGNOSIS — M86142 Other acute osteomyelitis, left hand: Secondary | ICD-10-CM | POA: Diagnosis not present

## 2021-12-20 HISTORY — PX: AMPUTATION: SHX166

## 2021-12-20 LAB — GLUCOSE, CAPILLARY
Glucose-Capillary: 227 mg/dL — ABNORMAL HIGH (ref 70–99)
Glucose-Capillary: 148 mg/dL — ABNORMAL HIGH (ref 70–99)
Glucose-Capillary: 158 mg/dL — ABNORMAL HIGH (ref 70–99)
Glucose-Capillary: 277 mg/dL — ABNORMAL HIGH (ref 70–99)
Glucose-Capillary: 216 mg/dL — ABNORMAL HIGH (ref 70–99)
Glucose-Capillary: 297 mg/dL — ABNORMAL HIGH (ref 70–99)

## 2021-12-20 LAB — RENAL FUNCTION PANEL
Albumin: 1.8 g/dL — ABNORMAL LOW (ref 3.5–5.0)
Anion gap: 7 (ref 5–15)
BUN: 8 mg/dL (ref 8–23)
CO2: 20 mmol/L — ABNORMAL LOW (ref 22–32)
Calcium: 7.8 mg/dL — ABNORMAL LOW (ref 8.9–10.3)
Chloride: 105 mmol/L (ref 98–111)
Creatinine, Ser: 0.62 mg/dL (ref 0.61–1.24)
GFR, Estimated: >60 mL/min (ref >60)
Glucose, Bld: 192 mg/dL — ABNORMAL HIGH (ref 70–99)
Phosphorus: 2.1 mg/dL — ABNORMAL LOW (ref 2.5–4.6)
Potassium: 3.7 mmol/L (ref 3.5–5.1)
Sodium: 132 mmol/L — ABNORMAL LOW (ref 135–145)

## 2021-12-20 LAB — CBC
HCT: 30.8 % — ABNORMAL LOW (ref 39.0–52.0)
Hemoglobin: 10 g/dL — ABNORMAL LOW (ref 13.0–17.0)
MCH: 25.1 pg — ABNORMAL LOW (ref 26.0–34.0)
MCHC: 32.5 g/dL (ref 30.0–36.0)
MCV: 77.2 fL — ABNORMAL LOW (ref 80.0–100.0)
Platelets: 649 10*3/uL — ABNORMAL HIGH (ref 150–400)
RBC: 3.99 MIL/uL — ABNORMAL LOW (ref 4.22–5.81)
RDW: 15.2 % (ref 11.5–15.5)
WBC: 13 10*3/uL — ABNORMAL HIGH (ref 4.0–10.5)
nRBC: 0 % (ref 0.0–0.2)

## 2021-12-20 LAB — MAGNESIUM: Magnesium: 2 mg/dL (ref 1.7–2.4)

## 2021-12-20 SURGERY — AMPUTATION, FOOT, RAY
Anesthesia: General | Site: Foot | Laterality: Left

## 2021-12-20 MED ORDER — BUPIVACAINE HCL (PF) 0.5 % IJ SOLN
INTRAMUSCULAR | Status: AC
  Filled 2021-12-20: qty 30

## 2021-12-20 MED ORDER — SODIUM CHLORIDE 0.9 % IV SOLN
1.0000 g | Freq: Three times a day (TID) | INTRAVENOUS | Status: DC
  Administered 2021-12-20 – 2021-12-23 (×10): 1 g via INTRAVENOUS
  Filled 2021-12-20 (×12): qty 20

## 2021-12-20 MED ORDER — FENTANYL CITRATE (PF) 250 MCG/5ML IJ SOLN
INTRAMUSCULAR | Status: AC
  Filled 2021-12-20: qty 5

## 2021-12-20 MED ORDER — DEXAMETHASONE SODIUM PHOSPHATE 10 MG/ML IJ SOLN
INTRAMUSCULAR | Status: DC | PRN
  Administered 2021-12-20: 4 mg via INTRAVENOUS

## 2021-12-20 MED ORDER — ONDANSETRON HCL 4 MG/2ML IJ SOLN
INTRAMUSCULAR | Status: DC | PRN
  Administered 2021-12-20: 4 mg via INTRAVENOUS

## 2021-12-20 MED ORDER — LIDOCAINE 2% (20 MG/ML) 5 ML SYRINGE
INTRAMUSCULAR | Status: AC
  Filled 2021-12-20: qty 5

## 2021-12-20 MED ORDER — INSULIN ASPART 100 UNIT/ML IJ SOLN
0.0000 [IU] | INTRAMUSCULAR | Status: DC | PRN
  Administered 2021-12-20: 2 [IU] via SUBCUTANEOUS

## 2021-12-20 MED ORDER — ORAL CARE MOUTH RINSE: 15.0000 mL | Freq: Once | OROMUCOSAL | Status: AC

## 2021-12-20 MED ORDER — LIDOCAINE 2% (20 MG/ML) 5 ML SYRINGE
INTRAMUSCULAR | Status: DC | PRN
  Administered 2021-12-20: 60 mg via INTRAVENOUS

## 2021-12-20 MED ORDER — LACTATED RINGERS IV SOLN: INTRAVENOUS | Status: DC

## 2021-12-20 MED ORDER — CHLORHEXIDINE GLUCONATE 0.12 % MT SOLN
15.0000 mL | Freq: Once | OROMUCOSAL | Status: AC
  Administered 2021-12-20: 15 mL via OROMUCOSAL

## 2021-12-20 MED ORDER — INDOCYANINE GREEN 25 MG IV SOLR
INTRAVENOUS | Status: DC | PRN
  Administered 2021-12-20: 7.5 mg via INTRAVENOUS

## 2021-12-20 MED ORDER — PROPOFOL 10 MG/ML IV BOLUS
INTRAVENOUS | Status: AC
  Filled 2021-12-20: qty 20

## 2021-12-20 MED ORDER — FENTANYL CITRATE (PF) 100 MCG/2ML IJ SOLN
INTRAMUSCULAR | Status: DC | PRN
  Administered 2021-12-20: 25 ug via INTRAVENOUS
  Administered 2021-12-20: 50 ug via INTRAVENOUS
  Administered 2021-12-20: 25 ug via INTRAVENOUS

## 2021-12-20 MED ORDER — ACETAMINOPHEN 500 MG PO TABS
1000.0000 mg | ORAL_TABLET | Freq: Once | ORAL | Status: AC
  Administered 2021-12-20: 1000 mg via ORAL
  Filled 2021-12-20: qty 2

## 2021-12-20 MED ORDER — ONDANSETRON HCL 4 MG/2ML IJ SOLN
INTRAMUSCULAR | Status: AC
  Filled 2021-12-20: qty 2

## 2021-12-20 MED ORDER — MIDAZOLAM HCL 2 MG/2ML IJ SOLN
INTRAMUSCULAR | Status: DC | PRN
  Administered 2021-12-20: 2 mg via INTRAVENOUS

## 2021-12-20 MED ORDER — LIDOCAINE HCL 1 % IJ SOLN
INTRAMUSCULAR | Status: DC | PRN
  Administered 2021-12-20: 10 mL via INTRAMUSCULAR

## 2021-12-20 MED ORDER — 0.9 % SODIUM CHLORIDE (POUR BTL) OPTIME
TOPICAL | Status: DC | PRN
  Administered 2021-12-20: 1000 mL

## 2021-12-20 MED ORDER — MIDAZOLAM HCL 2 MG/2ML IJ SOLN
INTRAMUSCULAR | Status: AC
  Filled 2021-12-20: qty 2

## 2021-12-20 MED ORDER — DEXAMETHASONE SODIUM PHOSPHATE 10 MG/ML IJ SOLN
INTRAMUSCULAR | Status: AC
  Filled 2021-12-20: qty 1

## 2021-12-20 MED ORDER — LIDOCAINE-EPINEPHRINE 1 %-1:100000 IJ SOLN
INTRAMUSCULAR | Status: AC
  Filled 2021-12-20: qty 1

## 2021-12-20 MED ORDER — CHLORHEXIDINE GLUCONATE 0.12 % MT SOLN
OROMUCOSAL | Status: AC
  Filled 2021-12-20: qty 15

## 2021-12-20 MED ORDER — INSULIN GLARGINE-YFGN 100 UNIT/ML ~~LOC~~ SOLN
20.0000 [IU] | Freq: Two times a day (BID) | SUBCUTANEOUS | Status: DC
  Administered 2021-12-20 – 2021-12-24 (×8): 20 [IU] via SUBCUTANEOUS
  Filled 2021-12-20 (×9): qty 0.2

## 2021-12-20 MED ORDER — PROPOFOL 10 MG/ML IV BOLUS
INTRAVENOUS | Status: DC | PRN
  Administered 2021-12-20: 170 mg via INTRAVENOUS

## 2021-12-20 SURGICAL SUPPLY — 31 items
BAG COUNTER SPONGE SURGICOUNT (BAG) IMPLANT
BLADE LONG MED 31X9 (MISCELLANEOUS) ×1 IMPLANT
BNDG COHESIVE 4X5 TAN STRL (GAUZE/BANDAGES/DRESSINGS) ×2 IMPLANT
BNDG ELASTIC 4X5.8 VLCR STR LF (GAUZE/BANDAGES/DRESSINGS) ×2 IMPLANT
BNDG ESMARK 4X9 LF (GAUZE/BANDAGES/DRESSINGS) IMPLANT
BNDG GAUZE ELAST 4 BULKY (GAUZE/BANDAGES/DRESSINGS) ×2 IMPLANT
COVER SURGICAL LIGHT HANDLE (MISCELLANEOUS) ×4 IMPLANT
CUFF TOURN SGL QUICK 18X4 (TOURNIQUET CUFF) ×2 IMPLANT
DRSG PAD ABDOMINAL 8X10 ST (GAUZE/BANDAGES/DRESSINGS) ×2 IMPLANT
ELECT REM PT RETURN 9FT ADLT (ELECTROSURGICAL) ×2
ELECTRODE REM PT RTRN 9FT ADLT (ELECTROSURGICAL) ×1 IMPLANT
GAUZE KERLIX 2  STERILE LF (GAUZE/BANDAGES/DRESSINGS) ×1 IMPLANT
GAUZE SPONGE 4X4 12PLY STRL (GAUZE/BANDAGES/DRESSINGS) IMPLANT
GLOVE BIO SURGEON STRL SZ7 (GLOVE) ×2 IMPLANT
GLOVE BIOGEL PI IND STRL 7.5 (GLOVE) ×1 IMPLANT
GLOVE BIOGEL PI INDICATOR 7.5 (GLOVE) ×1
GOWN STRL REUS W/ TWL LRG LVL3 (GOWN DISPOSABLE) ×2 IMPLANT
GOWN STRL REUS W/TWL LRG LVL3 (GOWN DISPOSABLE) ×4
HANDPIECE INTERPULSE COAX TIP (DISPOSABLE) ×2
IV NS 1000ML (IV SOLUTION) ×2
IV NS 1000ML BAXH (IV SOLUTION) ×1 IMPLANT
KIT BASIN OR (CUSTOM PROCEDURE TRAY) ×2 IMPLANT
KIT TURNOVER KIT B (KITS) ×2 IMPLANT
MANIFOLD NEPTUNE II (INSTRUMENTS) ×2 IMPLANT
NEEDLE 22X1 1/2 (OR ONLY) (NEEDLE) IMPLANT
NS IRRIG 1000ML POUR BTL (IV SOLUTION) ×2 IMPLANT
PACK ORTHO EXTREMITY (CUSTOM PROCEDURE TRAY) ×2 IMPLANT
PAD ARMBOARD 7.5X6 YLW CONV (MISCELLANEOUS) ×4 IMPLANT
SET HNDPC FAN SPRY TIP SCT (DISPOSABLE) ×1 IMPLANT
SYR CONTROL 10ML LL (SYRINGE) IMPLANT
TOWEL GREEN STERILE (TOWEL DISPOSABLE) ×2 IMPLANT

## 2021-12-20 NOTE — Anesthesia Procedure Notes (Signed)
Procedure Name: LMA Insertion Date/Time: 12/20/2021 7:56 AM  Performed by: Inda Coke, CRNAPre-anesthesia Checklist: Patient identified, Emergency Drugs available, Suction available, Timeout performed and Patient being monitored Patient Re-evaluated:Patient Re-evaluated prior to induction Oxygen Delivery Method: Circle system utilized Preoxygenation: Pre-oxygenation with 100% oxygen Induction Type: IV induction Ventilation: Mask ventilation without difficulty LMA: LMA inserted LMA Size: 4.0 Tube type: Oral Placement Confirmation: positive ETCO2, CO2 detector and breath sounds checked- equal and bilateral Tube secured with: Tape Dental Injury: Teeth and Oropharynx as per pre-operative assessment

## 2021-12-20 NOTE — Transfer of Care (Signed)
Immediate Anesthesia Transfer of Care Note  Patient: [name]  Procedure(s) Performed: LEFT FOOT REVISION PARTIAL FIRST RAY AMPUTATION WITH PLACEMENT OF PRESSURE WOUND VAC (Left: Foot)  Patient Location: PACU  Anesthesia Type:General  Level of Consciousness: awake and drowsy  Airway & Oxygen Therapy: Patient Spontanous Breathing and Patient connected to face mask oxygen  Post-op Assessment: Report given to RN and Post -op Vital signs reviewed and stable  Post vital signs: Reviewed and stable  Last Vitals:  Vitals Value Taken Time  BP 151/86 12/20/21 0833  Temp    Pulse 92 12/20/21 0836  Resp 13 12/20/21 0836  SpO2 94 % 12/20/21 0836  Vitals shown include unvalidated device data.  Last Pain:  Vitals:   12/20/21 0702  TempSrc: Oral  PainSc:       Patients Stated Pain Goal: 3 (17/35/67 0141)  Complications: No notable events documented.

## 2021-12-20 NOTE — Progress Notes (Signed)
Pt headed off unit with transport for surgical procedure

## 2021-12-20 NOTE — Anesthesia Preprocedure Evaluation (Signed)
Anesthesia Evaluation  Patient identified by MRN, date of birth, ID band Patient awake    Reviewed: Allergy & Precautions, H&P , NPO status , Patient's Chart, lab work & pertinent test results  History of Anesthesia Complications Negative for: history of anesthetic complications  Airway Mallampati: II  TM Distance: >3 FB Neck ROM: Full    Dental no notable dental hx. (+) Edentulous Upper, Edentulous Lower, Dental Advisory Given   Pulmonary neg shortness of breath, sleep apnea , neg COPD, neg recent URI, former smoker,    breath sounds clear to auscultation       Cardiovascular hypertension, Pt. on medications and Pt. on home beta blockers + Peripheral Vascular Disease   Rhythm:Regular Rate:Normal     Neuro/Psych PSYCHIATRIC DISORDERS (PTSD) Anxiety Depression negative neurological ROS     GI/Hepatic Neg liver ROS, GERD  ,  Endo/Other  diabetes, Insulin Dependent  Renal/GU negative Renal ROS  negative genitourinary   Musculoskeletal   Abdominal   Peds  Hematology  (+) Blood dyscrasia, anemia , plavix  Lab Results      Component                Value               Date                      WBC                      13.0 (H)            12/20/2021                HGB                      10.0 (L)            12/20/2021                HCT                      30.8 (L)            12/20/2021                MCV                      77.2 (L)            12/20/2021                PLT                      649 (H)             12/20/2021              Anesthesia Other Findings   Reproductive/Obstetrics negative OB ROS                             Anesthesia Physical Anesthesia Plan  ASA: 3  Anesthesia Plan: General   Post-op Pain Management:    Induction: Intravenous  PONV Risk Score and Plan: 2 and Ondansetron and Dexamethasone  Airway Management Planned: LMA  Additional Equipment:  None  Intra-op Plan:   Post-operative Plan: Extubation in OR  Informed Consent: I have reviewed the patients History and Physical, chart, labs and discussed the procedure  including the risks, benefits and alternatives for the proposed anesthesia with the patient or authorized representative who has indicated his/her understanding and acceptance.     Dental advisory given  Plan Discussed with: CRNA  Anesthesia Plan Comments:         Anesthesia Quick Evaluation

## 2021-12-20 NOTE — Progress Notes (Signed)
PROGRESS NOTE  CLEARANCE CHENAULT HUD:[id number] DOB: 1956/02/07   PCP: Harvie Junior, MD  Patient is from: Home.  Lives with fianc.  Has rolling walker  DOA: 12/14/2021 LOS: 6  Chief complaints Chief Complaint  Patient presents with   Wound Infection     Brief Narrative / Interim history: 66 year old M with PMH of IDDM-2 with neuropathy and other complications, PVD s/p right below-knee popliteal artery to PTA bypass and right TMT amputation on 09/19/2021 and s/p left PTA PCI and stent to left SFA on 12/08/2021, COPD, OSA not on CPAP, HTN, HLD, GERD and depression presented with new pain and drainage from left small toe, subjective fever and decreased appetite.  He was on p.o. Keflex after recent hospitalization, which was discontinued by ID 4 days prior to presentation.  He was admitted with gangrenous left foot infection and PAD.  He was started on IV vancomycin, CTX and Flagyl.  Vascular surgery and podiatry consulted.    X-ray with subcutaneous soft tissue gas about left fifth metatarsal and metatarsophalangeal joint concerning for gas-forming infection.  MRI of left foot without contrast shows soft tissue ulceration and findings suggestive for acute osteomyelitis of fifth metatarsal head, fifth metatarsal shaft, distal phalanx of left great toe and mild extensor digitorum brevis tenosynovitis.   LE Doppler on 6/12 concerning for some stenosis in SFA, DFA and PT with monophasic flow throughout.  ABI with moderate right and mild left PAD improved from prior ABI.  Had aortogram on 6/14 that showed maximal revascularization without intervention.    Patient underwent left big toe amputation and left 5th ray partial amputation on 6/15, and left lateral foot excisional debridement with washout and wound VAC application on 8/58.  Surgical culture with ESBL E. coli. Infectious disease consulted.  He will be NWB on left foot.   Subjective: Seen and examined earlier this morning after he returned  from foot surgery.  No complaints.  He reports clear mucus from his bottom.  Denies abdominal pain, nausea or vomiting.  Objective: Vitals:   12/20/21 0835 12/20/21 0850 12/20/21 0905 12/20/21 0929  BP: (!) 151/86 (!) 150/75 (!) 148/66 138/84  Pulse: 92 88 90 85  Resp: 13 14 13 14   Temp: 98.4 F (36.9 C)  98.8 F (37.1 C) 98.4 F (36.9 C)  TempSrc:    Oral  SpO2: 96% 93% 92% 95%  Weight:      Height:        Examination:  GENERAL: No apparent distress.  Nontoxic. HEENT: MMM.  Vision and hearing grossly intact.  NECK: Supple.  No apparent JVD.  RESP:  No IWOB.  Fair aeration bilaterally. CVS:  RRR. Heart sounds normal.  ABD/GI/GU: BS+. Abd soft, NTND.  MSK/EXT: S/p right TMT amputation.  Dressing over right foot DCI.  Wound VAC over left foot. SKIN: As above. NEURO: Awake and alert. Oriented appropriately.  No apparent focal neuro deficit. PSYCH: Calm. Normal affect.   Procedures:  None  Microbiology summarized: Blood cultures NGTD. Surgical MRSA PCR screen positive. Surgical tissue culture ESBL E. coli  Assessment and plan: Principal Problem:   Gangrene of left foot (HCC) Active Problems:   Acute osteomyelitis of phalanx of foot, left (HCC)   Acute osteomyelitis of metacarpal bone, left (HCC)   Diabetic foot ulcer with osteomyelitis (HCC)   Severe sepsis (HCC)   Tenosynovitis of left foot   PAD (peripheral artery disease) ([place])   AKI (acute kidney injury) ([place])   Type 2 diabetes mellitus ([place])  Hypertension associated with diabetes ([place])   Depression   GERD without esophagitis   Hyperlipidemia associated with type 2 diabetes mellitus (HCC)   S/P transmetatarsal amputation of foot, right (HCC)   Normocytic anemia   Bandemia   Thrombocytosis   Hyponatremia   Hypokalemia   Hypophosphatemia  Severe sepsis: POA.  Had tachycardia, leukocytosis, lactic acidosis, fever and AKI.  Acute osteomyelitis of left foot  Diabetic foot ulcer with osteomyelitis in  patient with uncontrolled IDDM-2 Tenosynovitis of left foot Left great toe gangrenous change Bilateral peripheral artery disease -X-ray, MRI, LE arterial duplex and ABI as above. -CRP 26.4.  ESR 139.  Blood cultures NGTD.  Surgical MRSA screen PCR positive. -6/14-aortogram with maximally revascularized LLE without intervention -6/15-left big toe amputation and left 5th ray partial amputation by Dr. Posey Pronto  -6/17-left lateral foot excisional debridement with washout and wound VAC application -He was on Vanco, cefepime and Flagyl.  Surgical culture with ESBL E. coli.  ID consulted. -Continue Lipitor, Plavix and aspirin -Increased gabapentin to 300 mg in the morning and afternoon and 600 mg at night on 6/15. -Nonweightbearing on left foot. -PT/OT consult  Uncontrolled IDDM-2 with neuropathy, PAD, ulcer infection with osteomyelitis and hyperglycemia A1c 8.0%. Recent Labs  Lab 12/19/21 1606 12/19/21 2200 12/20/21 0705 12/20/21 0832 12/20/21 1127  GLUCAP 185* 227* 148* 158* 277*  -Increase SSI to moderate -Increase Semglee from 10 to 20 units twice daily starting tonight -Continue Lipitor 80 mg daily -Increased gabapentin as above.   AKI: Resolved. Recent Labs    11/05/21 1223 12/03/21 1244 12/08/21 1336 12/14/21 1540 12/15/21 0448 12/16/21 0123 12/17/21 0141 12/18/21 0118 12/19/21 0754 12/20/21 0215  BUN 19 9 13 19 15 11 12 11  7* 8  CREATININE 0.98 0.80 0.80 1.32* 0.96 0.78 0.82 0.79 0.76 0.62  -Monitor   Normocytic anemia: Stable after initial drop. Recent Labs    12/03/21 1244 12/08/21 1336 12/14/21 1540 12/15/21 0448 12/16/21 0123 12/17/21 0141 12/17/21 2019 12/18/21 0118 12/19/21 0754 12/20/21 0215  HGB 16.0 12.6* 12.5* 10.6* 10.6* 10.8* 12.0* 11.1* 10.1* 10.0*  -Continue monitoring  Hypertension associated with diabetes: BP within acceptable range. -Continue home amlodipine and Lopressor. -Continue holding lisinopril   Depression/mood disorder:  Stable. -Continue home bupropion, Latuda, Trileptal, Zyprexa.   Hyperlipidemia associated with type 2 diabetes mellitus (HCC) -Continue atorvastatin.  OSA not on CPAP  Thrombocytosis/bandemia: likely due to sepsis. -Continue monitoring  Hyponatremia: Mild. -Continue monitoring  Hypokalemia and hypophosphatemia -Monitor replenish as appropriate  Bandemia: Likely related to sepsis. -Continue monitoring  Obesity: BMI 31.14. -Encourage lifestyle change to lose weight -May benefit from GLP-1 agonist.  GERD: -Continue PPI  DVT prophylaxis:  heparin injection 5,000 Units Start: 12/14/21 2200  Code Status: Full code Family Communication: None at bedside Level of care: Med-Surg Status is: Inpatient Remains inpatient appropriate because: Due to diabetic foot ulcer infection with osteomyelitis requiring IV antibiotics, further evaluation and surgical intervention   Final disposition: TBD Consultants:  Vascular surgery-signed off Podiatry Infectious disease  Sch Meds:  Scheduled Meds:  (feeding supplement) PROSource Plus  30 mL Oral Daily   amLODipine  5 mg Oral q morning   aspirin EC  81 mg Oral Daily   atorvastatin  80 mg Oral QHS   buPROPion  300 mg Oral q morning   chlorhexidine       Chlorhexidine Gluconate Cloth  6 each Topical Q0600   cholecalciferol  1,000 Units Oral Daily   clopidogrel  75 mg Oral Daily  feeding supplement  237 mL Oral BID BM   fluticasone furoate-vilanterol  1 puff Inhalation Daily   gabapentin  300 mg Oral [id number]   And   gabapentin  600 mg Oral QHS   heparin  5,000 Units Subcutaneous Q8H   insulin aspart  0-5 Units Subcutaneous QHS   insulin aspart  0-9 Units Subcutaneous TID WC   insulin glargine-yfgn  10 Units Subcutaneous BID   lurasidone  40 mg Oral Q supper   metoprolol tartrate  50 mg Oral BID   mupirocin ointment  1 application  Nasal BID   OLANZapine  2.5 mg Oral QHS   OXcarbazepine  150 mg Oral BID   pantoprazole  40 mg  Oral QHS   sodium chloride flush  3 mL Intravenous Q12H   Continuous Infusions:  sodium chloride 100 mL/hr at 12/20/21 1001   sodium chloride     ceFEPime (MAXIPIME) IV 2 g (12/20/21 1428)   metronidazole 500 mg (12/20/21 1217)   vancomycin 1,250 mg (12/20/21 1003)   PRN Meds:.sodium chloride, acetaminophen **OR** acetaminophen, albuterol, chlorhexidine, hydrALAZINE, HYDROcodone-acetaminophen, labetalol, meclizine, ondansetron **OR** ondansetron (ZOFRAN) IV, ondansetron (ZOFRAN) IV, oxyCODONE, senna-docusate, sodium chloride flush  Antimicrobials: Anti-infectives (From admission, onward)    Start     Dose/Rate Route Frequency Ordered Stop   12/19/21 2200  vancomycin (VANCOREADY) IVPB 1250 mg/250 mL        1,250 mg 166.7 mL/hr over 90 Minutes Intravenous Every 12 hours 12/19/21 1040     12/16/21 1400  ceFEPIme (MAXIPIME) 2 g in sodium chloride 0.9 % 100 mL IVPB        2 g 200 mL/hr over 30 Minutes Intravenous Every 8 hours 12/16/21 1259     12/16/21 0200  vancomycin (VANCOREADY) IVPB 1500 mg/300 mL  Status:  Discontinued        1,500 mg 150 mL/hr over 120 Minutes Intravenous Every 24 hours 12/15/21 1108 12/19/21 1040   12/15/21 2200  vancomycin (VANCOREADY) IVPB 1250 mg/250 mL  Status:  Discontinued        1,250 mg 166.7 mL/hr over 90 Minutes Intravenous Every 24 hours 12/14/21 2022 12/15/21 1108   12/14/21 2100  metroNIDAZOLE (FLAGYL) IVPB 500 mg        500 mg 100 mL/hr over 60 Minutes Intravenous Every 12 hours 12/14/21 2022     12/14/21 2030  vancomycin (VANCOREADY) IVPB 2000 mg/400 mL        2,000 mg 200 mL/hr over 120 Minutes Intravenous  Once 12/14/21 2015 12/15/21 0516   12/14/21 2000  cefTRIAXone (ROCEPHIN) 2 g in sodium chloride 0.9 % 100 mL IVPB  Status:  Discontinued        2 g 200 mL/hr over 30 Minutes Intravenous Every 24 hours 12/14/21 1948 12/16/21 1242   12/14/21 2000  metroNIDAZOLE (FLAGYL) IVPB 500 mg  Status:  Discontinued        500 mg 100 mL/hr over 60  Minutes Intravenous Every 8 hours 12/14/21 1948 12/14/21 2022        I have personally reviewed the following labs and images: CBC: Recent Labs  Lab 12/14/21 1540 12/15/21 0448 12/17/21 0141 12/17/21 2019 12/18/21 0118 12/19/21 0754 12/20/21 0215  WBC 16.8*   < > 13.6* 14.1* 14.3* 16.0* 13.0*  NEUTROABS 13.0*  --   --   --   --   --   --   HGB 12.5*   < > 10.8* 12.0* 11.1* 10.1* 10.0*  HCT 40.1   < > 32.0*  36.3* 34.8* 31.5* 30.8*  MCV 82.2   < > 77.3* 76.9* 77.9* 77.8* 77.2*  PLT 598*   < > 631* 551* 649* 618* 649*   < > = values in this interval not displayed.   BMP &GFR Recent Labs  Lab 12/16/21 0123 12/17/21 0141 12/18/21 0118 12/19/21 0754 12/20/21 0215  NA 133* 134* 133* 135 132*  K 4.0 4.0 4.0 3.4* 3.7  CL 103 99 102 104 105  CO2 22 22 20* 22 20*  GLUCOSE 161* 146* 205* 165* 192*  BUN 11 12 11  7* 8  CREATININE 0.78 0.82 0.79 0.76 0.62  CALCIUM 8.3* 8.7* 8.2* 8.2* 7.8*  MG  --  1.9 1.9 2.0 2.0  PHOS  --  2.7 2.0* 2.1* 2.1*   Estimated Creatinine Clearance: 105.8 mL/min (by C-G formula based on SCr of 0.62 mg/dL). Liver & Pancreas: Recent Labs  Lab 12/14/21 1540 12/17/21 0141 12/18/21 0118 12/19/21 0754 12/20/21 0215  AST 21  --   --   --   --   ALT 22  --   --   --   --   ALKPHOS 82  --   --   --   --   BILITOT 0.6  --   --   --   --   PROT 8.0  --   --   --   --   ALBUMIN 3.1* 2.4* 2.3* 1.9* 1.8*   No results for input(s): "LIPASE", "AMYLASE" in the last 168 hours. No results for input(s): "AMMONIA" in the last 168 hours. Diabetic: No results for input(s): "HGBA1C" in the last 72 hours.  Recent Labs  Lab 12/19/21 1606 12/19/21 2200 12/20/21 0705 12/20/21 0832 12/20/21 1127  GLUCAP 185* 227* 148* 158* 277*   Cardiac Enzymes: No results for input(s): "CKTOTAL", "CKMB", "CKMBINDEX", "TROPONINI" in the last 168 hours. No results for input(s): "PROBNP" in the last 8760 hours. Coagulation Profile: No results for input(s): "INR", "PROTIME"  in the last 168 hours. Thyroid Function Tests: No results for input(s): "TSH", "T4TOTAL", "FREET4", "T3FREE", "THYROIDAB" in the last 72 hours. Lipid Profile: Recent Labs    12/18/21 0118  CHOL 86  HDL 14*  LDLCALC 43  TRIG 143  CHOLHDL 6.1   Anemia Panel: No results for input(s): "VITAMINB12", "FOLATE", "FERRITIN", "TIBC", "IRON", "RETICCTPCT" in the last 72 hours. Urine analysis:    Component Value Date/Time   COLORURINE YELLOW 11/05/2021 1224   APPEARANCEUR CLEAR 11/05/2021 1224   LABSPEC 1.031 (H) 11/05/2021 1224   PHURINE 5.0 11/05/2021 1224   GLUCOSEU >=500 (A) 11/05/2021 1224   HGBUR NEGATIVE 11/05/2021 1224   BILIRUBINUR NEGATIVE 11/05/2021 1224   KETONESUR NEGATIVE 11/05/2021 1224   PROTEINUR NEGATIVE 11/05/2021 1224   UROBILINOGEN 2.0 (H) 02/17/2015 2051   NITRITE NEGATIVE 11/05/2021 1224   LEUKOCYTESUR NEGATIVE 11/05/2021 1224   Sepsis Labs: Invalid input(s): "PROCALCITONIN", "LACTICIDVEN"  Microbiology: Recent Results (from the past 240 hour(s))  Blood culture (routine x 2)     Status: None   Collection Time: 12/14/21  3:40 PM   Specimen: BLOOD RIGHT HAND  Result Value Ref Range Status   Specimen Description BLOOD RIGHT HAND  Final   Special Requests   Final    BOTTLES DRAWN AEROBIC ONLY Blood Culture results may not be optimal due to an inadequate volume of blood received in culture bottles   Culture   Final    NO GROWTH 5 DAYS Performed at [facility], [place],  [place] [address]    Report Status 12/19/2021 FINAL  Final  Blood culture (routine x 2)     Status: None   Collection Time: 12/14/21  6:40 PM   Specimen: BLOOD RIGHT FOREARM  Result Value Ref Range Status   Specimen Description BLOOD RIGHT FOREARM  Final   Special Requests   Final    BOTTLES DRAWN AEROBIC AND ANAEROBIC Blood Culture adequate volume   Culture   Final    NO GROWTH 5 DAYS Performed at [facility], [place] [address], [place], [place] [address]     Report Status 12/19/2021 FINAL  Final  Surgical pcr screen     Status: Abnormal   Collection Time: 12/18/21  2:44 AM   Specimen: Nasal Mucosa; Nasal Swab  Result Value Ref Range Status   MRSA, PCR POSITIVE (A) NEGATIVE Final   Staphylococcus aureus POSITIVE (A) NEGATIVE Final    Comment: RESULTS CALLED TO READ BACK BY AND VERIFIED WITH RN [email or web address] @0722  ON 12/18/2021 BY NM (NOTE) The Xpert SA Assay (FDA approved for NASAL specimens in patients 62 years of age and older), is one component of a comprehensive surveillance program. It is not intended to diagnose infection nor to guide or monitor treatment. Performed at [facility], [place] [address], [place], [place] [address]   Aerobic/Anaerobic Culture w Gram Stain (surgical/deep wound)     Status: None (Preliminary result)   Collection Time: 12/18/21 12:48 PM   Specimen: Soft Tissue, Other  Result Value Ref Range Status   Specimen Description TISSUE  Final   Special Requests LEFT FIFTH TOE  Final   Gram Stain   Final    ABUNDANT WBC PRESENT, PREDOMINANTLY PMN ABUNDANT GRAM POSITIVE COCCI IN PAIRS ABUNDANT GRAM NEGATIVE RODS    Culture   Final    ABUNDANT ESCHERICHIA COLI Confirmed Extended Spectrum Beta-Lactamase Producer (ESBL).  In bloodstream infections from ESBL organisms, carbapenems are preferred over piperacillin/tazobactam. They are shown to have a lower risk of mortality. HOLDING FOR POSSIBLE ANAEROBE Performed at [facility], [place] [address], [place], [place] [address]    Report Status PENDING  Incomplete   Organism ID, Bacteria ESCHERICHIA COLI  Final      Susceptibility   Escherichia coli - MIC*    AMPICILLIN >=32 RESISTANT Resistant     CEFAZOLIN >=64 RESISTANT Resistant     CEFEPIME >=32 RESISTANT Resistant     CEFTAZIDIME >=64 RESISTANT Resistant     CEFTRIAXONE >=64 RESISTANT Resistant     CIPROFLOXACIN >=4 RESISTANT Resistant     GENTAMICIN <=1 SENSITIVE Sensitive     IMIPENEM <=0.25 SENSITIVE  Sensitive     TRIMETH/SULFA >=320 RESISTANT Resistant     AMPICILLIN/SULBACTAM >=32 RESISTANT Resistant     PIP/TAZO <=4 SENSITIVE Sensitive     * ABUNDANT ESCHERICHIA COLI  Aerobic/Anaerobic Culture w Gram Stain (surgical/deep wound)     Status: None (Preliminary result)   Collection Time: 12/20/21  8:17 AM   Specimen: PATH Bone biopsy; Tissue  Result Value Ref Range Status   Specimen Description BONE  Final   Special Requests LEFT CLEAN MARGIN  Final   Gram Stain   Final    NO WBC SEEN NO ORGANISMS SEEN Performed at [facility], [address], [place],  [address]    Culture PENDING  Incomplete   Report Status PENDING  Incomplete    Radiology Studies: No results found.    Dawnmarie Breon T. Bellbrook  If  7PM-7AM, please contact night-coverage [email or web address] 12/20/2021, 3:30 PM

## 2021-12-20 NOTE — Consult Note (Signed)
[facility] for Infectious Disease       Reason for Consult: osteomyelitis    Referring Physician: Dr. Cyndia Skeeters  Principal Problem:   Gangrene of left foot ([place]) Active Problems:   Type 2 diabetes mellitus ([place])   Depression   Hypertension associated with diabetes ([place])   AKI (acute kidney injury) ([place])   GERD without esophagitis   PAD (peripheral artery disease) ([place])   Hyperlipidemia associated with type 2 diabetes mellitus ([place])   Acute osteomyelitis of phalanx of foot, left ([place])   Acute osteomyelitis of metacarpal bone, left ([place])   Tenosynovitis of left foot   S/P transmetatarsal amputation of foot, right (HCC)   Diabetic foot ulcer with osteomyelitis (HCC)   Severe sepsis (HCC)   Normocytic anemia   Bandemia   Thrombocytosis   Hyponatremia   Hypokalemia   Hypophosphatemia    (feeding supplement) PROSource Plus  30 mL Oral Daily   amLODipine  5 mg Oral q morning   aspirin EC  81 mg Oral Daily   atorvastatin  80 mg Oral QHS   buPROPion  300 mg Oral q morning   chlorhexidine       Chlorhexidine Gluconate Cloth  6 each Topical Q0600   cholecalciferol  1,000 Units Oral Daily   clopidogrel  75 mg Oral Daily   feeding supplement  237 mL Oral BID BM   fluticasone furoate-vilanterol  1 puff Inhalation Daily   gabapentin  300 mg Oral [id number]   And   gabapentin  600 mg Oral QHS   heparin  5,000 Units Subcutaneous Q8H   insulin aspart  0-5 Units Subcutaneous QHS   insulin aspart  0-9 Units Subcutaneous TID WC   insulin glargine-yfgn  20 Units Subcutaneous BID   lurasidone  40 mg Oral Q supper   metoprolol tartrate  50 mg Oral BID   mupirocin ointment  1 application  Nasal BID   OLANZapine  2.5 mg Oral QHS   OXcarbazepine  150 mg Oral BID   pantoprazole  40 mg Oral QHS   sodium chloride flush  3 mL Intravenous Q12H    Recommendations: Augmetin x 7 days at discharge  Assessment: Has has dry gangrene of his great toe and now s/p revascularization and  osteomyelitis of left 5th toe s/p amputation.  Culture from 6/15 with ESBL E coli.  Has not been on carbapenem.  At this point, his infection has been surgically treated and I would recommend above for any possible polymicrobial soft tissue infection left.  I will change him to meropenem pending discharge, though ok from ID standpoint for discharge when ready    Antibiotics: Day 7 vancomycin, cefepime, metronidazole  HPI: [name] is a 66 y.o. male with a history of type 2 diabetes, HTN, COPD, OSA, PVD and recent right below the knee popliteal to posterior tibial artery bypass and right transmetatarsal amputation in March and angioplasty in the left leg this month presented 6/11 with left foot infection.  He started having pain and drainage of his left 5th toe and pain in his foot and came to the ED.  Found to have left great toe dry gangrene in addition to left toe infection and revascularized on 6/14.  He was seen by podiatry and underwent left 5th toe amputation on 6/15.    Review of Systems:  Constitutional: negative for fevers and chills All other systems reviewed and are negative    Past Medical History:  Diagnosis Date  Diabetes mellitus with peripheral vascular disease    Hypertension    Neuropathy    PTSD (post-traumatic stress disorder)    Sleep apnea     Social History   Tobacco Use   Smoking status: Former    Types: Cigarettes    Passive exposure: Never   Smokeless tobacco: Never  Vaping Use   Vaping Use: Never used  Substance Use Topics   Alcohol use: No   Drug use: Never    Family History  Problem Relation Age of Onset   Stroke Mother    Coronary artery disease Father    COPD Brother     Allergies  Allergen Reactions   Gemfibrozil Other (See Comments)    Doesn't know its been awhile   Oxycontin [Oxycodone Hcl] Other (See Comments)    Bad reaction "took me to a place I never wanna go again"   Pork-Derived Products    Simvastatin Other (See  Comments)    Says he doesn't know its been awhile    Physical Exam: Constitutional: in no apparent distress  Vitals:   12/20/21 0905 12/20/21 0929  BP: (!) 148/66 138/84  Pulse: 90 85  Resp: 13 14  Temp: 98.8 F (37.1 C) 98.4 F (36.9 C)  SpO2: 92% 95%   EYES: anicteric Respiratory: normal respiratory effort Musculoskeletal: feet wrapped  Lab Results  Component Value Date   WBC 13.0 (H) 12/20/2021   HGB 10.0 (L) 12/20/2021   HCT 30.8 (L) 12/20/2021   MCV 77.2 (L) 12/20/2021   PLT 649 (H) 12/20/2021    Lab Results  Component Value Date   CREATININE 0.62 12/20/2021   BUN 8 12/20/2021   NA 132 (L) 12/20/2021   K 3.7 12/20/2021   CL 105 12/20/2021   CO2 20 (L) 12/20/2021    Lab Results  Component Value Date   ALT 22 12/14/2021   AST 21 12/14/2021   ALKPHOS 82 12/14/2021     Microbiology: Recent Results (from the past 240 hour(s))  Blood culture (routine x 2)     Status: None   Collection Time: 12/14/21  3:40 PM   Specimen: BLOOD RIGHT HAND  Result Value Ref Range Status   Specimen Description BLOOD RIGHT HAND  Final   Special Requests   Final    BOTTLES DRAWN AEROBIC ONLY Blood Culture results may not be optimal due to an inadequate volume of blood received in culture bottles   Culture   Final    NO GROWTH 5 DAYS Performed at [facility], [place] [address], [place], [place] [address]    Report Status 12/19/2021 FINAL  Final  Blood culture (routine x 2)     Status: None   Collection Time: 12/14/21  6:40 PM   Specimen: BLOOD RIGHT FOREARM  Result Value Ref Range Status   Specimen Description BLOOD RIGHT FOREARM  Final   Special Requests   Final    BOTTLES DRAWN AEROBIC AND ANAEROBIC Blood Culture adequate volume   Culture   Final    NO GROWTH 5 DAYS Performed at [facility], [place] [address], [place], [place] [address]    Report Status 12/19/2021 FINAL  Final  Surgical pcr screen     Status: Abnormal   Collection Time: 12/18/21  2:44 AM    Specimen: Nasal Mucosa; Nasal Swab  Result Value Ref Range Status   MRSA, PCR POSITIVE (A) NEGATIVE Final   Staphylococcus aureus POSITIVE (A) NEGATIVE Final    Comment: RESULTS  CALLED TO READ BACK BY AND VERIFIED WITH RN [email or web address] '@[id number]'$  ON 12/18/2021 BY NM (NOTE) The Xpert SA Assay (FDA approved for NASAL specimens in patients 62 years of age and older), is one component of a comprehensive surveillance program. It is not intended to diagnose infection nor to guide or monitor treatment. Performed at [facility], [place] [address], [place], [place] [address]   Aerobic/Anaerobic Culture w Gram Stain (surgical/deep wound)     Status: None (Preliminary result)   Collection Time: 12/18/21 12:48 PM   Specimen: Soft Tissue, Other  Result Value Ref Range Status   Specimen Description TISSUE  Final   Special Requests LEFT FIFTH TOE  Final   Gram Stain   Final    ABUNDANT WBC PRESENT, PREDOMINANTLY PMN ABUNDANT GRAM POSITIVE COCCI IN PAIRS ABUNDANT GRAM NEGATIVE RODS    Culture   Final    ABUNDANT ESCHERICHIA COLI Confirmed Extended Spectrum Beta-Lactamase Producer (ESBL).  In bloodstream infections from ESBL organisms, carbapenems are preferred over piperacillin/tazobactam. They are shown to have a lower risk of mortality. HOLDING FOR POSSIBLE ANAEROBE Performed at [facility], [place] [address], [place], [place] [address]    Report Status PENDING  Incomplete   Organism ID, Bacteria ESCHERICHIA COLI  Final      Susceptibility   Escherichia coli - MIC*    AMPICILLIN >=32 RESISTANT Resistant     CEFAZOLIN >=64 RESISTANT Resistant     CEFEPIME >=32 RESISTANT Resistant     CEFTAZIDIME >=64 RESISTANT Resistant     CEFTRIAXONE >=64 RESISTANT Resistant     CIPROFLOXACIN >=4 RESISTANT Resistant     GENTAMICIN <=1 SENSITIVE Sensitive     IMIPENEM <=0.25 SENSITIVE Sensitive     TRIMETH/SULFA >=320 RESISTANT Resistant     AMPICILLIN/SULBACTAM >=32 RESISTANT Resistant     PIP/TAZO <=4  SENSITIVE Sensitive     * ABUNDANT ESCHERICHIA COLI  Aerobic/Anaerobic Culture w Gram Stain (surgical/deep wound)     Status: None (Preliminary result)   Collection Time: 12/20/21  8:17 AM   Specimen: PATH Bone biopsy; Tissue  Result Value Ref Range Status   Specimen Description BONE  Final   Special Requests LEFT CLEAN MARGIN  Final   Gram Stain   Final    NO WBC SEEN NO ORGANISMS SEEN Performed at [facility], [address] [place],  [address]    Culture PENDING  Incomplete   Report Status PENDING  Incomplete    Thayer Headings, [facility] [email or web address] 12/20/2021, 4:01 PM

## 2021-12-20 NOTE — Progress Notes (Signed)
Pharmacy Antibiotic Note  [name] is a 66 y.o. male admitted on 12/14/2021 with ESBL infection.  Pharmacy has been consulted for Meropenem dosing. Patient has dry gangrene of his great toe and now s/p revascularization and osteomyelitis of left 5th toe s/p amputation.  Culture from 6/15 now with ESBL E coli.  Plan: D/c vanc, cefepime, Flagyl Start Meropenem 1 gram IV every 8 hours Monitor clinical progress, cultures/sensitivities, renal function, abx plan  Height: 6' (182.9 cm) Weight: 95.1 kg (209 lb 10.5 oz) IBW/kg (Calculated) : 77.6  Temp (24hrs), Avg:98.9 F (37.2 C), Min:98.4 F (36.9 C), Max:99.5 F (37.5 C)  Recent Labs  Lab 12/14/21 1540 12/14/21 1740 12/15/21 0448 12/16/21 0123 12/17/21 0141 12/17/21 2019 12/18/21 0118 12/19/21 0754 12/20/21 0215  WBC 16.8*  --    < > 14.1* 13.6* 14.1* 14.3* 16.0* 13.0*  CREATININE 1.32*  --    < > 0.78 0.82  --  0.79 0.76 0.62  LATICACIDVEN 2.8* 1.8  --   --   --   --   --   --   --    < > = values in this interval not displayed.    Estimated Creatinine Clearance: 105.8 mL/min (by C-G formula based on SCr of 0.62 mg/dL).    Allergies  Allergen Reactions   Gemfibrozil Other (See Comments)    Doesn't know its been awhile   Oxycontin [Oxycodone Hcl] Other (See Comments)    Bad reaction "took me to a place I never wanna go again"   Pork-Derived Products    Simvastatin Other (See Comments)    Says he doesn't know its been awhile    Antimicrobials this admission: Rocephin 6/11 >> 6/13 Flagyl 6/11 >>6/17 Vanc 6/11 >>6/17 Cefepime 6/13>>6/17 6/17 Meropenem >>   Microbiology results: 6/15 wCx - e.col - sens zosyn 6/17 wCx - ESBL e coli   Thank you for allowing [place] to participate in this patients care. Jens Som, PharmD 12/20/2021 4:27 PM  **Pharmacist phone directory can be found on [email or web address] listed under [facility]**

## 2021-12-20 NOTE — Interval H&P Note (Signed)
History and Physical Interval Note:  12/20/2021 7:24 AM  [name]  has presented today for surgery, with the diagnosis of Osteomyelitis.  The various methods of treatment have been discussed with the patient and family. After consideration of risks, benefits and other options for treatment, the patient has consented to  Procedure(s): Plum (Left) with negative pressure wound therapy as a surgical intervention.  The patient's history has been reviewed, patient examined, no change in status, stable for surgery.  I have reviewed the patient's chart and labs.  Questions were answered to the patient's satisfaction.     [name]

## 2021-12-20 NOTE — Evaluation (Signed)
Physical Therapy Evaluation Patient Details Name: [name] MRN: [id number] DOB: Aug 07, 1955 Today's Date: 12/20/2021  History of Present Illness  66 y.o. male presents to [facility] hospital on 12/14/2021 with L foot infection. Pt recently admitted in June, underwent angioplasty and stenting to L SFA on 6/5.   Pt underwent angiogram on 6/14, followed by L big toe and 5th ray amputation on 6/15. Pt underwent L lateral foot debridement on 6/17. 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  Clinical Impression  Pt presents to PT with deficits in functional mobility, strength, power, sensation, balance. Pt is limited by LE weakness and WB restrictions to bilateral feet. Pt is unable to stand this session, requiring maxA to clear buttocks from bed a few inches with PT assistance. Pt does demonstrate the ability to laterally scoot and transfers from elevated surface to recliner. Pt will benefit from further transfer training along with wheelchair training as this will likely be the safest method of mobilization while his WB restrictions remain in place. PT recommends SNF placement at the time of discharge.       Recommendations for follow up therapy are one component of a multi-disciplinary discharge planning process, led by the attending physician.  Recommendations may be updated based on patient status, additional functional criteria and insurance authorization.  Follow Up Recommendations Skilled nursing-short term rehab (<3 hours/day)    Assistance Recommended at Discharge Intermittent Supervision/Assistance  Patient can return home with the following  A lot of help with walking and/or transfers;A lot of help with bathing/dressing/bathroom;Assistance with cooking/housework;Assist for transportation;Help with stairs or ramp for entrance    Equipment Recommendations Wheelchair (measurements PT);Wheelchair cushion (measurements PT)  Recommendations for Other Services        Functional Status Assessment Patient has had a recent decline in their functional status and demonstrates the ability to make significant improvements in function in a reasonable and predictable amount of time.     Precautions / Restrictions Precautions Precautions: Fall Precaution Comments: wound vac L foot Restrictions Weight Bearing Restrictions: Yes RLE Weight Bearing:  (WBAT through heel in post-op shoe) LLE Weight Bearing: Non weight bearing      Mobility  Bed Mobility Overal bed mobility: Needs Assistance Bed Mobility: Supine to Sit     Supine to sit: Supervision, HOB elevated     General bed mobility comments: use of rails    Transfers Overall transfer level: Needs assistance Equipment used: Rolling walker (2 wheels), 1 person hand held assist Transfers: Sit to/from Stand, Bed to chair/wheelchair/BSC Sit to Stand: Total assist (unable to successfully stand with use of RW or face to face assist while maintaining WB restrictions)          Lateral/Scoot Transfers: Min assist, From elevated surface General transfer comment: pt laterally scoots from bed to drop arm recliner with verbal cues and minA to aide in posterior scoot    Ambulation/Gait                  Stairs            Wheelchair Mobility    Modified Rankin (Stroke Patients Only)       Balance Overall balance assessment: Needs assistance Sitting-balance support: No upper extremity supported, Feet supported Sitting balance-Leahy Scale: Good     Standing balance support:  (unable to stand)  Pertinent Vitals/Pain Pain Assessment Pain Assessment: 0-10 Pain Score: 3  Pain Location: L foot Pain Descriptors / Indicators: Burning Pain Intervention(s): Monitored during session    Home Living Family/patient expects to be discharged to:: Private residence Living Arrangements: Spouse/significant other Available Help at Discharge:  Family;Available PRN/intermittently Type of Home: House Home Access: Stairs to enter Entrance Stairs-Rails: None Entrance Stairs-Number of Steps: 2   Home Layout: One level Home Equipment: Conservation officer, nature (2 wheels);Cane - single point      Prior Function Prior Level of Function : Independent/Modified Independent             Mobility Comments: pt ambulating household distances with RW, still working with HHPT since R transmet amputation       Hand Dominance        Extremity/Trunk Assessment   Upper Extremity Assessment Upper Extremity Assessment: Overall WFL for tasks assessed    Lower Extremity Assessment Lower Extremity Assessment: Generalized weakness;RLE deficits/detail;LLE deficits/detail RLE Deficits / Details: R transmet amputation RLE Sensation: history of peripheral neuropathy LLE Deficits / Details: L great toe and fifth ray amputation LLE Sensation: history of peripheral neuropathy    Cervical / Trunk Assessment Cervical / Trunk Assessment: Normal  Communication   Communication: No difficulties  Cognition Arousal/Alertness: Awake/alert Behavior During Therapy: WFL for tasks assessed/performed Overall Cognitive Status: Within Functional Limits for tasks assessed                                          General Comments General comments (skin integrity, edema, etc.): VSS on RA    Exercises     Assessment/Plan    PT Assessment Patient needs continued PT services  PT Problem List Decreased strength;Decreased activity tolerance;Decreased balance;Decreased mobility;Decreased knowledge of precautions;Decreased knowledge of use of DME;Pain;Impaired sensation       PT Treatment Interventions DME instruction;Functional mobility training;Therapeutic activities;Gait training;Therapeutic exercise;Balance training;Neuromuscular re-education;Patient/family education;Wheelchair mobility training    PT Goals (Current goals can be found in the  Care Plan section)  Acute Rehab PT Goals Patient Stated Goal: to improve mobility quality while maintaining WB precautions PT Goal Formulation: With patient Time For Goal Achievement: 01/03/22 Potential to Achieve Goals: Fair Additional Goals Additional Goal #1: Pt will mobilize in a manual wheelchair with use of BUE at a supervision level to aide in improving independence in household mobility    Frequency Min 3X/week     Co-evaluation               AM-PAC PT "6 Clicks" Mobility  Outcome Measure Help needed turning from your back to your side while in a flat bed without using bedrails?: A Little Help needed moving from lying on your back to sitting on the side of a flat bed without using bedrails?: A Little Help needed moving to and from a bed to a chair (including a wheelchair)?: A Lot Help needed standing up from a chair using your arms (e.g., wheelchair or bedside chair)?: Total Help needed to walk in hospital room?: Total Help needed climbing 3-5 steps with a railing? : Total 6 Click Score: 11    End of Session   Activity Tolerance: Patient tolerated treatment well Patient left: in chair;with call bell/phone within reach;with chair alarm set Nurse Communication: Mobility status;Need for lift equipment PT Visit Diagnosis: Other abnormalities of gait and mobility (R26.89);Muscle weakness (generalized) (M62.81)    Time: 2952-8413 PT  Time Calculation (min) (ACUTE ONLY): 44 min   Charges:   PT Evaluation $PT Eval Moderate Complexity: 1 Mod PT Treatments $Therapeutic Activity: 8-22 mins        Zenaida Niece, PT, DPT Acute Rehabilitation Office [phone]   Zenaida Niece 12/20/2021, 3:42 PM

## 2021-12-20 NOTE — Progress Notes (Signed)
0925- Patient returned to unit from PACU. Right foot wound vac in place, -125 mmHg. VSS

## 2021-12-20 NOTE — Progress Notes (Signed)
Pt placed on bedpan.. informed of npo status and plan for surgery. No active orders at this time, pt aware of poc

## 2021-12-20 NOTE — Op Note (Signed)
Surgeon: Surgeon(s): [name], DPM  Assistants: None Pre-operative diagnosis: Osteomyelitis  Post-operative diagnosis: same Procedure: Left lateral foot excisional debridement with washout and application of negative pressure wound therapy Pathology:  ID Type Source Tests Collected by Time Destination  1 : LEFT CLEAN [facility]  Tissue PATH Bone biopsy SURGICAL PATHOLOGY [name], DPM 12/20/2021 8563   A : LEFT CLEAN [facility] Tissue PATH Bone biopsy AEROBIC/ANAEROBIC CULTURE W GRAM STAIN (SURGICAL/DEEP WOUND) [name], DPM 12/20/2021 1497     Pertinent Intra-op findings: Hard indurated bone noted of the fifth remaining metatarsal.  Adequate bleeding. Anesthesia: General 10% of half percent Marcaine plain Hemostasis:  EBL: 10 mL  Materials: Application of wound VAC Injectables: None Complications: None  Indications for surgery: A 66 y.o. male presents with left foot osteomyelitis with diabetic foot ulceration. Patient has failed all conservative therapy including but not limited to local wound care and IV antibiotic. He wishes to have surgical correction of the foot/deformity. It was determined that patient would benefit from excisional debridement of the wound with application of negative pressure wound therapy.  This was part of a staged procedure. Informed surgical risk consent was reviewed and read aloud to the patient.  I reviewed the films.  I have discussed my findings with the patient in great detail.  I have discussed all risks including but not limited to infection, stiffness, scarring, limp, disability, deformity, damage to blood vessels and nerves, numbness, poor healing, need for braces, arthritis, chronic pain, amputation, death.  All benefits and realistic expectations discussed in great detail.  I have made no promises as to the outcome.  I have provided realistic expectations.  I have offered the patient a 2nd opinion, which they have declined and assured me they  preferred to proceed despite the risks   Procedure in detail: The patient was both verbally and visually identified by myself, the nursing staff, and anesthesia staff in the preoperative holding area. They were then transferred to the operating room and placed on the operative table in supine position.  Attention was directed to the left lateral foot, using rongeur fibrotic tissue was thoroughly debrided down.  This was excised out.  It was carried down into bleeding tissue.  No further purulent drainage noted.  At this time was determined that patient will benefit from negative pressure wound VAC therapy.  The wound was thoroughly irrigated with normal saline solution.  Clean margins were taken of the fifth metatarsal remaining bone sent to micro and pathology in standard technique.  Negative pressure wound therapy was applied in standard technique.  The fifth metatarsal appeared to be clear of infection.  The foot was secured with Kerlix and Ace bandage.  All bony prominences were adequately padded.   Disposition -Patient is okay to be discharged from our standpoint.  Patient will need infectious disease consult for antibiotics management.  He will need wound VAC changes Monday Wednesday Friday set up.  He will benefit from a nursing facility as he will need to be nonweightbearing to the left lower extremity.  He can follow-up in our clinic 1 week from discharge.  At the conclusion of the procedure the patient was awoken from anesthesia and found to have tolerated the procedure well any complications. There were transferred to PACU with vital signs stable and vascular status intact.  Boneta Lucks, DPM

## 2021-12-21 ENCOUNTER — Encounter (HOSPITAL_COMMUNITY): Payer: Self-pay | Admitting: Podiatry

## 2021-12-21 DIAGNOSIS — F32A Depression, unspecified: Secondary | ICD-10-CM | POA: Diagnosis not present

## 2021-12-21 DIAGNOSIS — I96 Gangrene, not elsewhere classified: Secondary | ICD-10-CM | POA: Diagnosis not present

## 2021-12-21 DIAGNOSIS — N179 Acute kidney failure, unspecified: Secondary | ICD-10-CM | POA: Diagnosis not present

## 2021-12-21 DIAGNOSIS — M86142 Other acute osteomyelitis, left hand: Secondary | ICD-10-CM | POA: Diagnosis not present

## 2021-12-21 LAB — GLUCOSE, CAPILLARY
Glucose-Capillary: 195 mg/dL — ABNORMAL HIGH (ref 70–99)
Glucose-Capillary: 152 mg/dL — ABNORMAL HIGH (ref 70–99)
Glucose-Capillary: 207 mg/dL — ABNORMAL HIGH (ref 70–99)
Glucose-Capillary: 243 mg/dL — ABNORMAL HIGH (ref 70–99)

## 2021-12-21 LAB — CBC
HCT: 30.8 % — ABNORMAL LOW (ref 39.0–52.0)
Hemoglobin: 10.3 g/dL — ABNORMAL LOW (ref 13.0–17.0)
MCH: 25.2 pg — ABNORMAL LOW (ref 26.0–34.0)
MCHC: 33.4 g/dL (ref 30.0–36.0)
MCV: 75.5 fL — ABNORMAL LOW (ref 80.0–100.0)
Platelets: 746 10*3/uL — ABNORMAL HIGH (ref 150–400)
RBC: 4.08 MIL/uL — ABNORMAL LOW (ref 4.22–5.81)
RDW: 15 % (ref 11.5–15.5)
WBC: 15.2 10*3/uL — ABNORMAL HIGH (ref 4.0–10.5)
nRBC: 0 % (ref 0.0–0.2)

## 2021-12-21 LAB — RENAL FUNCTION PANEL
Albumin: 1.9 g/dL — ABNORMAL LOW (ref 3.5–5.0)
Anion gap: 9 (ref 5–15)
BUN: 12 mg/dL (ref 8–23)
CO2: 23 mmol/L (ref 22–32)
Calcium: 8.3 mg/dL — ABNORMAL LOW (ref 8.9–10.3)
Chloride: 105 mmol/L (ref 98–111)
Creatinine, Ser: 0.71 mg/dL (ref 0.61–1.24)
GFR, Estimated: >60 mL/min (ref >60)
Glucose, Bld: 215 mg/dL — ABNORMAL HIGH (ref 70–99)
Phosphorus: 2.1 mg/dL — ABNORMAL LOW (ref 2.5–4.6)
Potassium: 3.7 mmol/L (ref 3.5–5.1)
Sodium: 137 mmol/L (ref 135–145)

## 2021-12-21 LAB — AEROBIC/ANAEROBIC CULTURE W GRAM STAIN (SURGICAL/DEEP WOUND)

## 2021-12-21 NOTE — Progress Notes (Signed)
[facility]                                                                               [name], is a 65 y.o. male, DOB - 1955/07/16, JFH:[id number] Admit date - 12/14/2021    Outpatient Primary MD for the patient is Harvie Junior, MD  LOS - 7  days    Brief summary   66 year old M with PMH of IDDM-2 with neuropathy and other complications, PVD s/p right below-knee popliteal artery to PTA bypass and right TMT amputation on 09/19/2021 and s/p left PTA PCI and stent to left SFA on 12/08/2021, COPD, OSA not on CPAP, HTN, HLD, GERD and depression presented with new pain and drainage from left small toe, subjective fever and decreased appetite.  He was on p.o. Keflex after recent hospitalization, which was discontinued by ID 4 days prior to presentation.  He was admitted with gangrenous left foot infection and PAD.  He was started on IV vancomycin, CTX and Flagyl.  Vascular surgery and podiatry consulted.     X-ray with subcutaneous soft tissue gas about left fifth metatarsal and metatarsophalangeal joint concerning for gas-forming infection.  MRI of left foot without contrast shows soft tissue ulceration and findings suggestive for acute osteomyelitis of fifth metatarsal head, fifth metatarsal shaft, distal phalanx of left great toe and mild extensor digitorum brevis tenosynovitis.   LE Doppler on 6/12 concerning for some stenosis in SFA, DFA and PT with monophasic flow throughout.  ABI with moderate right and mild left PAD improved from prior ABI.  Had aortogram on 6/14 that showed maximal revascularization without intervention.     Patient underwent left big toe amputation and left 5th ray partial amputation on 6/15, and left lateral foot excisional debridement with washout and wound VAC application on 9/37.  Surgical culture with ESBL E. coli. Infectious disease consulted.  He will be NWB on left foot.    Assessment & Plan    Assessment and Plan:  Severe sepsis  present on admission with tachycardia leukocytosis, lactic acidosis, fever and AKI Acute osteomyelitis of the left foot, diabetic foot ulcer in the setting of uncontrolled diabetes mellitus. Left great toe gangrenous changes in the setting of bilateral peripheral artery disease Blood cultures negative till date. Aortogram with maximally revascularized left lower extremity without intervention on June 14. Patient underwent left big toe amputation and left fifth ray partial amputation by Dr. Posey Pronto on 12/18/2021. He also underwent left lateral foot excisional debridement with washout and wound VAC application on 3/42/8768.  Patient on broad-spectrum IV antibiotics, ID on board and appreciate recommendations. Nonweightbearing on the left foot. Continue with aspirin Plavix and Lipitor. Therapy evaluations recommending SNF on discharge.  Uncontrolled type 2 diabetes mellitus insulin-dependent - Increase Semglee from 10 to 20 units twice daily starting tonight -Continue Lipitor 80 mg daily -Increased gabapentin as above. -CBG (last 3)  Recent Labs    12/20/21 1637 12/20/21 2054 12/21/21 0803  GLUCAP 216* 297* 195*   Continue with SSI.    AKI Resolved   Anemia of chronic disease/normocytic anemia Hemoglobin stable at this time.    Hypertension Blood pressure parameters are optimal, continue with  amlodipine and Lopressor. Hold lisinopril in view of recent AKI.  Hyperlipidemia Continue with statin.   Thrombocytosis probably secondary to sepsis Continue to monitor   Depression/mood disorder Continue with home medications    GERD stable continue with PPI.      Malnutrition Type:  Nutrition Problem: Increased nutrient needs Etiology: wound healing   Malnutrition Characteristics:  Signs/Symptoms: estimated needs   Nutrition Interventions:  Interventions: Ensure Enlive (each supplement provides 350kcal and 20 grams of protein), Prostat  Estimated body mass index  is 28.43 kg/m as calculated from the following:   Height as of this encounter: 6' (1.829 m).   Weight as of this encounter: 95.1 kg.  Code Status: Full code DVT Prophylaxis:  heparin injection 5,000 Units Start: 12/14/21 2200   Level of Care: Level of care: Med-Surg Family Communication: None at bedside  Disposition Plan:     Remains inpatient appropriate: SNF     Consultants:   Vascular surgery Podiatry ID  Antimicrobials:   Anti-infectives (From admission, onward)    Start     Dose/Rate Route Frequency Ordered Stop   12/20/21 1715  meropenem (MERREM) 1 g in sodium chloride 0.9 % 100 mL IVPB        1 g 200 mL/hr over 30 Minutes Intravenous Every 8 hours 12/20/21 1619     12/19/21 2200  vancomycin (VANCOREADY) IVPB 1250 mg/250 mL  Status:  Discontinued        1,250 mg 166.7 mL/hr over 90 Minutes Intravenous Every 12 hours 12/19/21 1040 12/20/21 1615   12/16/21 1400  ceFEPIme (MAXIPIME) 2 g in sodium chloride 0.9 % 100 mL IVPB  Status:  Discontinued        2 g 200 mL/hr over 30 Minutes Intravenous Every 8 hours 12/16/21 1259 12/20/21 1615   12/16/21 0200  vancomycin (VANCOREADY) IVPB 1500 mg/300 mL  Status:  Discontinued        1,500 mg 150 mL/hr over 120 Minutes Intravenous Every 24 hours 12/15/21 1108 12/19/21 1040   12/15/21 2200  vancomycin (VANCOREADY) IVPB 1250 mg/250 mL  Status:  Discontinued        1,250 mg 166.7 mL/hr over 90 Minutes Intravenous Every 24 hours 12/14/21 2022 12/15/21 1108   12/14/21 2100  metroNIDAZOLE (FLAGYL) IVPB 500 mg  Status:  Discontinued        500 mg 100 mL/hr over 60 Minutes Intravenous Every 12 hours 12/14/21 2022 12/20/21 1615   12/14/21 2030  vancomycin (VANCOREADY) IVPB 2000 mg/400 mL        2,000 mg 200 mL/hr over 120 Minutes Intravenous  Once 12/14/21 2015 12/15/21 0516   12/14/21 2000  cefTRIAXone (ROCEPHIN) 2 g in sodium chloride 0.9 % 100 mL IVPB  Status:  Discontinued        2 g 200 mL/hr over 30 Minutes Intravenous Every  24 hours 12/14/21 1948 12/16/21 1242   12/14/21 2000  metroNIDAZOLE (FLAGYL) IVPB 500 mg  Status:  Discontinued        500 mg 100 mL/hr over 60 Minutes Intravenous Every 8 hours 12/14/21 1948 12/14/21 2022        Medications  Scheduled Meds:  (feeding supplement) PROSource Plus  30 mL Oral Daily   amLODipine  5 mg Oral q morning   aspirin EC  81 mg Oral Daily   atorvastatin  80 mg Oral QHS   buPROPion  300 mg Oral q morning   Chlorhexidine Gluconate Cloth  6 each Topical Q0600   cholecalciferol  1,000  Units Oral Daily   clopidogrel  75 mg Oral Daily   feeding supplement  237 mL Oral BID BM   fluticasone furoate-vilanterol  1 puff Inhalation Daily   gabapentin  300 mg Oral BH-[id number]   And   gabapentin  600 mg Oral QHS   heparin  5,000 Units Subcutaneous Q8H   insulin aspart  0-5 Units Subcutaneous QHS   insulin aspart  0-9 Units Subcutaneous TID WC   insulin glargine-yfgn  20 Units Subcutaneous BID   lurasidone  40 mg Oral Q supper   metoprolol tartrate  50 mg Oral BID   mupirocin ointment  1 application  Nasal BID   OLANZapine  2.5 mg Oral QHS   OXcarbazepine  150 mg Oral BID   pantoprazole  40 mg Oral QHS   sodium chloride flush  3 mL Intravenous Q12H   Continuous Infusions:  sodium chloride 100 mL/hr at 12/20/21 1001   sodium chloride     meropenem (MERREM) IV 1 g (12/21/21 0525)   PRN Meds:.sodium chloride, acetaminophen **OR** acetaminophen, albuterol, hydrALAZINE, HYDROcodone-acetaminophen, labetalol, meclizine, ondansetron **OR** ondansetron (ZOFRAN) IV, ondansetron (ZOFRAN) IV, oxyCODONE, senna-docusate, sodium chloride flush    Subjective:   [name] was seen and examined today.  Patient denies any chest pain or shortness of breath. Patient wants to know when he can be discharged.  His pain in the left foot is about 3 out of 10. Objective:   Vitals:   12/20/21 0850 12/20/21 0905 12/20/21 0929 12/20/21 1942  BP: (!) 150/75 (!) 148/66 138/84 130/70   Pulse: 88 90 85 80  Resp: '14 13 14 19  '$ Temp:  98.8 F (37.1 C) 98.4 F (36.9 C) 98.1 F (36.7 C)  TempSrc:   Oral Oral  SpO2: 93% 92% 95% 100%  Weight:      Height:        Intake/Output Summary (Last 24 hours) at 12/21/2021 1001 Last data filed at 12/21/2021 0525 Gross per 24 hour  Intake 2520 ml  Output 3500 ml  Net -980 ml   Filed Weights   12/18/21 1051 12/19/21 0424 12/20/21 0702  Weight: 101.6 kg 95.1 kg 95.1 kg     Exam General exam: Appears calm and comfortable  Respiratory system: Clear to auscultation. Respiratory effort normal. Cardiovascular system: S1 & S2 heard, RRR. No JVD, Gastrointestinal system: Abdomen is nondistended, soft and nontender.  Normal bowel sounds heard. Central nervous system: Alert and oriented. No focal neurological deficits. Extremities: S/p right TMT amputation, dressing over the right foot wound VAC over the left foot. Skin: No rashes, lesions or ulcers Psychiatry: Judgement and insight appear normal. Mood & affect appropriate.     Data Reviewed:  I have personally reviewed following labs and imaging studies   CBC Lab Results  Component Value Date   WBC 15.2 (H) 12/21/2021   RBC 4.08 (L) 12/21/2021   HGB 10.3 (L) 12/21/2021   HCT 30.8 (L) 12/21/2021   MCV 75.5 (L) 12/21/2021   MCH 25.2 (L) 12/21/2021   PLT 746 (H) 12/21/2021   MCHC 33.4 12/21/2021   RDW 15.0 12/21/2021   LYMPHSABS 1.7 12/14/2021   MONOABS 1.9 (H) 12/14/2021   EOSABS 0.1 12/14/2021   BASOSABS 0.1 62/94/7654     Last metabolic panel Lab Results  Component Value Date   NA 137 12/21/2021   K 3.7 12/21/2021   CL 105 12/21/2021   CO2 23 12/21/2021   BUN 12 12/21/2021   CREATININE 0.71 12/21/2021   GLUCOSE  215 (H) 12/21/2021   GFRNONAA >60 12/21/2021   GFRAA >60 02/17/2015   CALCIUM 8.3 (L) 12/21/2021   PHOS 2.1 (L) 12/21/2021   PROT 8.0 12/14/2021   ALBUMIN 1.9 (L) 12/21/2021   BILITOT 0.6 12/14/2021   ALKPHOS 82 12/14/2021   AST 21 12/14/2021    ALT 22 12/14/2021   ANIONGAP 9 12/21/2021    CBG (last 3)  Recent Labs    12/20/21 1637 12/20/21 2054 12/21/21 0803  GLUCAP 216* 297* 195*      Coagulation Profile: No results for input(s): "INR", "PROTIME" in the last 168 hours.   Radiology Studies: No results found.     Hosie Poisson M.D. [facility] 12/21/2021, 10:01 AM  Available via Epic secure chat 7am-7pm After 7 pm, please refer to night coverage provider listed on amion.

## 2021-12-21 NOTE — NC FL2 (Signed)
[place] LEVEL OF CARE SCREENING TOOL     IDENTIFICATION  Patient Name: [name] Birthdate: January 12, 1956 Sex: male Admission Date (Current Location): 12/14/2021  [facility] and [place] Number:  Herbalist and Address:  The . [facility], [place] [address] [place], [place] [address]      Provider Number: [id number]  Attending Physician Name and Address:  Hosie Poisson, MD  Relative Name and Phone Number:  Evelena Peat (significant other) [phone]    Current Level of Care: Hospital Recommended Level of Care: [place] Prior Approval Number:    Date Approved/Denied:   PASRR Number: [id number]  Discharge Plan: SNF    Current Diagnoses: Patient Active Problem List   Diagnosis Date Noted   Hypokalemia 12/19/2021   Hypophosphatemia 12/19/2021   Hyponatremia 12/18/2021   Bandemia 12/17/2021   Thrombocytosis 12/17/2021   Acute osteomyelitis of phalanx of foot, left ([place]) 12/16/2021   Acute osteomyelitis of metacarpal bone, left ([place]) 12/16/2021   Tenosynovitis of left foot 12/16/2021   S/P transmetatarsal amputation of foot, right ([place]) 12/16/2021   Diabetic foot ulcer with osteomyelitis ([place]) 12/16/2021   Severe sepsis ([place]) 12/16/2021   Normocytic anemia 12/16/2021   Gangrene of left foot ([place]) 12/14/2021   PAD (peripheral artery disease) ([place]) 12/14/2021   Hyperlipidemia associated with type 2 diabetes mellitus ([place]) 12/14/2021   At risk for adverse drug event 09/23/2021   Cellulitis in diabetic foot ([place]) 09/13/2021   Dyslipidemia 09/13/2021   Depression 09/13/2021   Hypertension associated with diabetes ([place]) 09/13/2021   AKI (acute kidney injury) ([place]) 09/13/2021   GERD without esophagitis 09/13/2021   Obstructive sleep apnea 09/13/2021   Gangrene of toe of right foot ([place]) 09/13/2021   Type 2 diabetes mellitus ([place]) 08/15/2021    Orientation RESPIRATION BLADDER Height & Weight     Self, Time,  Situation, Place (WDL)  Normal Incontinent, External catheter (External Urinary Catheter) Weight: 209 lb 10.5 oz (95.1 kg) Height:  6' (182.9 cm)  BEHAVIORAL SYMPTOMS/MOOD NEUROLOGICAL BOWEL NUTRITION STATUS      Continent Diet (Please see discharge summary)  AMBULATORY STATUS COMMUNICATION OF NEEDS Skin   Extensive Assist Verbally Other (Comment) (Apropriate for ethnicity,dry,non-tenting,Incision closed foot Left,negative pressure wound therapy,dressing in place,Wound incision LDAs,Please see additional info)                       Personal Care Assistance Level of Assistance  Bathing, Feeding, Dressing Bathing Assistance: Maximum assistance Feeding assistance: Limited assistance (Able to feed self;Needs set up;Carb Modified) Dressing Assistance: Maximum assistance     Functional Limitations Info  Sight, Hearing, Speech Sight Info: Adequate (WDL) Hearing Info: Adequate Speech Info: Adequate    SPECIAL CARE FACTORS FREQUENCY  PT (By licensed PT), OT (By licensed OT)     PT Frequency: 5x min weekly OT Frequency: 5x min weekly            Contractures Contractures Info: Not present    Additional Factors Info  Code Status, Allergies, Psychotropic, Insulin Sliding Scale Code Status Info: FULL Allergies Info: Gemfibrozil,Oxycontin (oxycodone Hcl),Pork-derived Products,Simvastatin Psychotropic Info: buPROPion (WELLBUTRIN XL) 24 hr tablet 300 mg every morning,OLANZapine (ZYPREXA) tablet 2.5 mg daily at bedtime Insulin Sliding Scale Info: insulin aspart (novoLOG) injection 0-5 Units daily at bedtime,insulin aspart (novoLOG) injection 0-9 Units 3 times daily with meals,insulin glargine-yfgn (SEMGLEE) injection 20 Units 2 times daily       Current Medications (12/21/2021):  This is the current hospital  active medication list Current Facility-Administered Medications  Medication Dose Route Frequency Provider Last Rate Last Admin   (feeding supplement) PROSource Plus liquid 30 mL   30 mL Oral Daily [name], DPM   30 mL at 12/20/21 0953   0.9 %  sodium chloride infusion   Intravenous Continuous [name], DPM 100 mL/hr at 12/20/21 1001 New Bag at 12/20/21 1001   0.9 %  sodium chloride infusion  250 mL Intravenous PRN [name], DPM       acetaminophen (TYLENOL) tablet 1,000 mg  1,000 mg Oral Q6H PRN [name], DPM   1,000 mg at 12/18/21 4132   Or   acetaminophen (TYLENOL) suppository 650 mg  650 mg Rectal Q6H PRN [name], DPM       albuterol (PROVENTIL) (2.5 MG/3ML) 0.083% nebulizer solution 2.5 mg  2.5 mg Inhalation Q6H PRN [name], DPM       amLODipine (NORVASC) tablet 5 mg  5 mg Oral q morning [name], DPM   5 mg at 12/21/21 1001   aspirin EC tablet 81 mg  81 mg Oral Daily [name], DPM   81 mg at 12/21/21 1001   atorvastatin (LIPITOR) tablet 80 mg  80 mg Oral QHS [name], DPM   80 mg at 12/20/21 2214   buPROPion (WELLBUTRIN XL) 24 hr tablet 300 mg  300 mg Oral q morning [name], DPM   300 mg at 12/21/21 1001   Chlorhexidine Gluconate Cloth 2 % PADS 6 each  6 each Topical Q0600 [name], DPM   6 each at 12/21/21 4401   cholecalciferol (VITAMIN D3) tablet 1,000 Units  1,000 Units Oral Daily [name], DPM   1,000 Units at 12/21/21 1001   clopidogrel (PLAVIX) tablet 75 mg  75 mg Oral Daily [name], DPM   75 mg at 12/21/21 1001   feeding supplement (ENSURE ENLIVE / ENSURE PLUS) liquid 237 mL  237 mL Oral BID BM [name], DPM   237 mL at 12/21/21 1003   fluticasone furoate-vilanterol (BREO ELLIPTA) 100-25 MCG/ACT 1 puff  1 puff Inhalation Daily [name], DPM   1 puff at 12/20/21 0954   gabapentin (NEURONTIN) capsule 300 mg  300 mg Oral BH-q8a2p [name], DPM   300 mg at 12/21/21 1308   And   gabapentin (NEURONTIN) capsule 600 mg  600 mg Oral QHS [name], DPM   600 mg at 12/20/21 2214   heparin injection 5,000 Units  5,000 Units Subcutaneous Q8H [name], DPM   5,000  Units at 12/21/21 1308   hydrALAZINE (APRESOLINE) injection 5 mg  5 mg Intravenous Q20 Min PRN [name], DPM       HYDROcodone-acetaminophen (NORCO/VICODIN) 5-325 MG per tablet 1-2 tablet  1-2 tablet Oral Q4H PRN [name], DPM   2 tablet at 12/21/21 1001   insulin aspart (novoLOG) injection 0-5 Units  0-5 Units Subcutaneous QHS [name], DPM   3 Units at 12/20/21 2215   insulin aspart (novoLOG) injection 0-9 Units  0-9 Units Subcutaneous TID WC [name], DPM   2 Units at 12/21/21 1308   insulin glargine-yfgn (SEMGLEE) injection 20 Units  20 Units Subcutaneous BID Wendee Beavers T, MD   20 Units at 12/21/21 1002   labetalol (NORMODYNE) injection 10 mg  10 mg Intravenous Q10 min PRN [name], DPM  lurasidone (LATUDA) tablet 40 mg  40 mg Oral Q supper [name], DPM   40 mg at 12/20/21 1655   meclizine (ANTIVERT) tablet 25 mg  25 mg Oral TID PRN [name], DPM       meropenem (MERREM) 1 g in sodium chloride 0.9 % 100 mL IVPB  1 g Intravenous Q8H Gonfa, Taye T, MD 200 mL/hr at 12/21/21 1312 1 g at 12/21/21 1312   metoprolol tartrate (LOPRESSOR) tablet 50 mg  50 mg Oral BID [name], DPM   50 mg at 12/21/21 1001   mupirocin ointment (BACTROBAN) 2 % 1 application   1 application  Nasal BID [name], DPM   1 application  at 76/54/65 1003   OLANZapine (ZYPREXA) tablet 2.5 mg  2.5 mg Oral QHS [name], DPM   2.5 mg at 12/20/21 2213   ondansetron (ZOFRAN) tablet 4 mg  4 mg Oral Q6H PRN [name], DPM       Or   ondansetron (ZOFRAN) injection 4 mg  4 mg Intravenous Q6H PRN [name], DPM       ondansetron (ZOFRAN) injection 4 mg  4 mg Intravenous Q6H PRN [name], DPM       OXcarbazepine (TRILEPTAL) tablet 150 mg  150 mg Oral BID [name], DPM   150 mg at 12/21/21 1001   oxyCODONE (Oxy IR/ROXICODONE) immediate release tablet 15 mg  15 mg Oral Q6H PRN [name], DPM   15 mg at 12/21/21 1308   pantoprazole (PROTONIX) EC  tablet 40 mg  40 mg Oral QHS [name], DPM   40 mg at 12/20/21 2213   senna-docusate (Senokot-S) tablet 1 tablet  1 tablet Oral QHS PRN [name], DPM       sodium chloride flush (NS) 0.9 % injection 3 mL  3 mL Intravenous Q12H [name], DPM   3 mL at 12/20/21 2216   sodium chloride flush (NS) 0.9 % injection 3 mL  3 mL Intravenous PRN [name], DPM         Discharge Medications: Please see discharge summary for a list of discharge medications.  Relevant Imaging Results:  Relevant Lab Results:   Additional Information [phone], Both Covid Vaccines 1 booster Incision closed Leg Right,compression wrap,gauze,clean.dry.intact,negative pressure wound therapy,foot anterior,Left lateral,dressing in place,Negative pressure wound therapy toe,anterior,Left,dry,dressing in place  Milas Gain, [place]

## 2021-12-21 NOTE — Evaluation (Signed)
Occupational Therapy Evaluation Patient Details Name: [name] MRN: [id number] DOB: [birth year] Today's Date: 12/21/2021   History of Present Illness 66 y.o. male presents to [facility] hospital on 12/14/2021 with L foot infection. Pt recently admitted in June, underwent angioplasty and stenting to L SFA on 6/5.   Pt underwent angiogram on 6/14, followed by L big toe and 5th ray amputation on 6/15. Pt underwent L lateral foot debridement on 6/17. 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   Clinical Impression   Pt independent at baseline with ADLs and functional mobility, lives with spouse and has family that can provide assist at d/c. Pt currently mod I - mod A for ADLs, min A for bed mobility and min A for lateral scoot transfer chair > bed to support LLE.  Pt with decreased recall of WB precautions, verbally reviewed with pt, able to adhere to precautions with assist during session. Pt presenting with impairments listed below, will follow acutely. Recommend SNF at d/c.     Recommendations for follow up therapy are one component of a multi-disciplinary discharge planning process, led by the attending physician.  Recommendations may be updated based on patient status, additional functional criteria and insurance authorization.   Follow Up Recommendations  Skilled nursing-short term rehab (<3 hours/day)    Assistance Recommended at Discharge Intermittent Supervision/Assistance  Patient can return home with the following Two people to help with walking and/or transfers;A lot of help with bathing/dressing/bathroom;Assistance with cooking/housework;Assist for transportation;Help with stairs or ramp for entrance    Functional Status Assessment  Patient has had a recent decline in their functional status and demonstrates the ability to make significant improvements in function in a reasonable and predictable amount of time.  Equipment Recommendations  None  recommended by OT;Other (comment) (defer to next venue of care)    Recommendations for Other Services PT consult     Precautions / Restrictions Precautions Precautions: Fall Precaution Comments: wound vac L foot Restrictions Weight Bearing Restrictions: Yes RLE Weight Bearing: Weight bearing as tolerated (in post op shoe) LLE Weight Bearing: Non weight bearing      Mobility Bed Mobility Overal bed mobility: Needs Assistance Bed Mobility: Sit to Supine       Sit to supine: Min assist   General bed mobility comments: for BLE mgmt    Transfers Overall transfer level: Needs assistance                Lateral/Scoot Transfers: Min assist General transfer comment: min A for LLE elevation during transfer      Balance Overall balance assessment: Needs assistance Sitting-balance support: No upper extremity supported, Feet supported Sitting balance-Leahy Scale: Good                                     ADL either performed or assessed with clinical judgement   ADL Overall ADL's : Needs assistance/impaired Eating/Feeding: Modified independent;Sitting   Grooming: Modified independent;Sitting   Upper Body Bathing: Sitting;Moderate assistance Upper Body Bathing Details (indicate cue type and reason): applying lotion to back Lower Body Bathing: Moderate assistance;Sitting/lateral leans   Upper Body Dressing : Supervision/safety;Sitting   Lower Body Dressing: Supervision/safety Lower Body Dressing Details (indicate cue type and reason): donning post op shoe Toilet Transfer: BSC/3in1;Requires drop arm;Minimal assistance Toilet Transfer Details (indicate cue type and reason): simulated lateral scoot Toileting- Clothing Manipulation and Hygiene: Supervision/safety  Functional mobility during ADLs: Minimal assistance       Vision   Vision Assessment?: No apparent visual deficits     Perception     Praxis      Pertinent Vitals/Pain Pain  Assessment Pain Assessment: No/denies pain     Hand Dominance     Extremity/Trunk Assessment Upper Extremity Assessment Upper Extremity Assessment: Overall WFL for tasks assessed   Lower Extremity Assessment Lower Extremity Assessment: Defer to PT evaluation   Cervical / Trunk Assessment Cervical / Trunk Assessment: Normal   Communication Communication Communication: No difficulties   Cognition Arousal/Alertness: Awake/alert Behavior During Therapy: WFL for tasks assessed/performed Overall Cognitive Status: Within Functional Limits for tasks assessed                                       General Comments  VSS on RA, family present in room    Exercises     Shoulder Instructions      Home Living Family/patient expects to be discharged to:: Private residence Living Arrangements: Spouse/significant other Available Help at Discharge: Family;Available PRN/intermittently Type of Home: House Home Access: Stairs to enter [employer] of Steps: 2 Entrance Stairs-Rails: None Home Layout: One level     Bathroom Shower/Tub: Teacher, early years/pre: Standard     Home Equipment: Conservation officer, nature (2 wheels);Cane - single point          Prior Functioning/Environment Prior Level of Function : Independent/Modified Independent             Mobility Comments: pt ambulating household distances with RW, still working with [place] since R transmet amputation ADLs Comments: independent        OT Problem List: Decreased strength;Decreased range of motion;Decreased activity tolerance;Impaired balance (sitting and/or standing);Decreased knowledge of use of DME or AE;Decreased knowledge of precautions      OT Treatment/Interventions: Self-care/ADL training;Therapeutic exercise;Energy conservation;DME and/or AE instruction;Therapeutic activities;Patient/family education;Balance training    OT Goals(Current goals can be found in the care plan  section) Acute Rehab OT Goals Patient Stated Goal: to get better OT Goal Formulation: With patient Time For Goal Achievement: 01/04/22 Potential to Achieve Goals: Good ADL Goals Pt Will Perform Upper Body Dressing: sitting;with modified independence Pt Will Perform Lower Body Dressing: with min guard assist;sitting/lateral leans;bed level Pt Will Transfer to Toilet: bedside commode;with mod assist Additional ADL Goal #1: pt will complete bed mobility mod I in prep for ADLs  OT Frequency: Min 2X/week    Co-evaluation              AM-PAC OT "6 Clicks" Daily Activity     Outcome Measure Help from another person eating meals?: None Help from another person taking care of personal grooming?: None Help from another person toileting, which includes using toliet, bedpan, or urinal?: A Lot Help from another person bathing (including washing, rinsing, drying)?: A Lot Help from another person to put on and taking off regular upper body clothing?: A Little Help from another person to put on and taking off regular lower body clothing?: A Lot 6 Click Score: 17   End of Session Equipment Utilized During Treatment: Gait belt Nurse Communication: Mobility status  Activity Tolerance: Patient tolerated treatment well Patient left: in bed;with call bell/phone within reach;with bed alarm set  OT Visit Diagnosis: Unsteadiness on feet (R26.81);Other abnormalities of gait and mobility (R26.89);Muscle weakness (generalized) (M62.81)  Time: 2992-4268 OT Time Calculation (min): 29 min Charges:  OT General Charges $OT Visit: 1 Visit OT Evaluation $OT Eval Moderate Complexity: 1 Mod OT Treatments $Therapeutic Activity: 8-22 mins  Lynnda Child, OTD, OTR/L Acute Rehab [phone]) [phone] - [place] 12/21/2021, 4:20 PM

## 2021-12-21 NOTE — Plan of Care (Signed)
  Problem: Education: Goal: Understanding of CV disease, CV risk reduction, and recovery process will improve Outcome: Progressing Goal: Individualized Educational Video(s) Outcome: Progressing   Problem: Activity: Goal: Ability to return to baseline activity level will improve Outcome: Progressing   Problem: Health Behavior/Discharge Planning: Goal: Ability to safely manage health-related needs after discharge will improve Outcome: Progressing   Problem: Education: Goal: Ability to describe self-care measures that may prevent or decrease complications (Diabetes Survival Skills Education) will improve Outcome: Progressing Goal: Individualized Educational Video(s) Outcome: Progressing   Problem: Coping: Goal: Ability to adjust to condition or change in health will improve Outcome: Progressing   Problem: Fluid Volume: Goal: Ability to maintain a balanced intake and output will improve Outcome: Progressing   Problem: Nutritional: Goal: Maintenance of adequate nutrition will improve Outcome: Progressing Goal: Progress toward achieving an optimal weight will improve Outcome: Progressing   Problem: Skin Integrity: Goal: Risk for impaired skin integrity will decrease Outcome: Progressing

## 2021-12-21 NOTE — TOC Initial Note (Signed)
Transition of Care [facility]) - Initial/Assessment Note    Patient Details  Name: [name] MRN: [id number] Date of Birth: 1955-09-17  Transition of Care [facility]) CM/SW Contact:    Milas Gain, [place] Phone Number: 12/21/2021, 1:09 PM  Clinical Narrative:                  CSW received consult for possible SNF placement at time of discharge. CSW spoke with patient regarding PT recommendation of SNF placement at time of discharge. Patient reports he comes from home with significant other.Patient expressed understanding of PT recommendation and is agreeable to SNF placement at time of discharge. Patient reports preference for [facility] . Patient gave CSW permission to fax out initial referral near the [place] area.CSW discussed insurance authorization process with patient. Patient reports he has received the COVID vaccines as well as 1 booster.  No further questions reported at this time. CSW to continue to follow and assist with discharge planning needs.  Expected Discharge Plan: Skilled Nursing Facility Barriers to Discharge: Continued Medical Work up   Patient Goals and CMS Choice Patient states their goals for this hospitalization and ongoing recovery are:: SNF CMS Medicare.gov Compare Post Acute Care list provided to:: Patient Choice offered to / list presented to : Patient  Expected Discharge Plan and Services Expected Discharge Plan: [place] In-house Referral: Clinical Social Work     Living arrangements for the past 2 months: [place]                                      Prior Living Arrangements/Services Living arrangements for the past 2 months: Single Family Home Lives with:: Significant Other Patient language and need for interpreter reviewed:: Yes Do you feel safe going back to the place where you live?: No   SNF  Need for Family Participation in Patient Care: Yes (Comment) Care giver support system in place?: Yes (comment)    Criminal Activity/Legal Involvement Pertinent to Current Situation/Hospitalization: No - Comment as needed  Activities of Daily Living      Permission Sought/Granted Permission sought to share information with : Family Supports, Case Freight forwarder, Chartered certified accountant granted to share information with : Yes, Verbal Permission Granted  Share Information with NAME: Evelena Peat  Permission granted to share info w AGENCY: SNF  Permission granted to share info w Relationship: Significant Other  Permission granted to share info w Contact Information: Evelena Peat [phone]  Emotional Assessment Appearance:: Appears stated age Attitude/Demeanor/Rapport: Gracious Affect (typically observed): Calm Orientation: : Oriented to Self, Oriented to Place, Oriented to  Time, Oriented to Situation (WDL) Alcohol / Substance Use: Not Applicable Psych Involvement: No (comment)  Admission diagnosis:  AKI (acute kidney injury) ([place]) [N17.9] Gangrene of left foot ([place]) [I96] Patient Active Problem List   Diagnosis Date Noted   Hypokalemia 12/19/2021   Hypophosphatemia 12/19/2021   Hyponatremia 12/18/2021   Bandemia 12/17/2021   Thrombocytosis 12/17/2021   Acute osteomyelitis of phalanx of foot, left ([place]) 12/16/2021   Acute osteomyelitis of metacarpal bone, left ([place]) 12/16/2021   Tenosynovitis of left foot 12/16/2021   S/P transmetatarsal amputation of foot, right ([place]) 12/16/2021   Diabetic foot ulcer with osteomyelitis ([place]) 12/16/2021   Severe sepsis ([place]) 12/16/2021   Normocytic anemia 12/16/2021   Gangrene of left foot ([place]) 12/14/2021   PAD (peripheral artery disease) ([place]) 12/14/2021   Hyperlipidemia associated with type 2 diabetes  mellitus ([place]) 12/14/2021   At risk for adverse drug event 09/23/2021   Cellulitis in diabetic foot ([place]) 09/13/2021   Dyslipidemia 09/13/2021   Depression 09/13/2021   Hypertension associated with diabetes ([place]) 09/13/2021   AKI (acute kidney  injury) ([place]) 09/13/2021   GERD without esophagitis 09/13/2021   Obstructive sleep apnea 09/13/2021   Gangrene of toe of right foot ([place]) 09/13/2021   Type 2 diabetes mellitus ([place]) 08/15/2021   PCP:  Harvie Junior, MD Pharmacy:   [facility] [place], [place] [address] Phone: [phone] Fax: [phone]     Social Determinants of Health (SDOH) Interventions    Readmission Risk Interventions     No data to display

## 2021-12-22 DIAGNOSIS — N179 Acute kidney failure, unspecified: Secondary | ICD-10-CM | POA: Diagnosis not present

## 2021-12-22 DIAGNOSIS — M86142 Other acute osteomyelitis, left hand: Secondary | ICD-10-CM | POA: Diagnosis not present

## 2021-12-22 DIAGNOSIS — F32A Depression, unspecified: Secondary | ICD-10-CM | POA: Diagnosis not present

## 2021-12-22 DIAGNOSIS — I96 Gangrene, not elsewhere classified: Secondary | ICD-10-CM | POA: Diagnosis not present

## 2021-12-22 LAB — CBC WITH DIFFERENTIAL/PLATELET
Abs Immature Granulocytes: 0.27 10*3/uL — ABNORMAL HIGH (ref 0.00–0.07)
Basophils Absolute: 0.1 10*3/uL (ref 0.0–0.1)
Basophils Relative: 1 %
Eosinophils Absolute: 0.7 10*3/uL — ABNORMAL HIGH (ref 0.0–0.5)
Eosinophils Relative: 6 %
HCT: 32.9 % — ABNORMAL LOW (ref 39.0–52.0)
Hemoglobin: 10.5 g/dL — ABNORMAL LOW (ref 13.0–17.0)
Immature Granulocytes: 2 %
Lymphocytes Relative: 25 %
Lymphs Abs: 3 10*3/uL (ref 0.7–4.0)
MCH: 24.9 pg — ABNORMAL LOW (ref 26.0–34.0)
MCHC: 31.9 g/dL (ref 30.0–36.0)
MCV: 78 fL — ABNORMAL LOW (ref 80.0–100.0)
Monocytes Absolute: 1.1 10*3/uL — ABNORMAL HIGH (ref 0.1–1.0)
Monocytes Relative: 9 %
Neutro Abs: 6.6 10*3/uL (ref 1.7–7.7)
Neutrophils Relative %: 57 %
Platelets: 805 10*3/uL — ABNORMAL HIGH (ref 150–400)
RBC: 4.22 MIL/uL (ref 4.22–5.81)
RDW: 15.3 % (ref 11.5–15.5)
WBC: 11.7 10*3/uL — ABNORMAL HIGH (ref 4.0–10.5)
nRBC: 0 % (ref 0.0–0.2)

## 2021-12-22 LAB — GLUCOSE, CAPILLARY
Glucose-Capillary: 153 mg/dL — ABNORMAL HIGH (ref 70–99)
Glucose-Capillary: 210 mg/dL — ABNORMAL HIGH (ref 70–99)
Glucose-Capillary: 207 mg/dL — ABNORMAL HIGH (ref 70–99)
Glucose-Capillary: 288 mg/dL — ABNORMAL HIGH (ref 70–99)

## 2021-12-22 LAB — SURGICAL PATHOLOGY

## 2021-12-22 MED ORDER — DIPHENHYDRAMINE HCL 25 MG PO CAPS
25.0000 mg | ORAL_CAPSULE | Freq: Four times a day (QID) | ORAL | Status: DC | PRN
Start: 2021-12-22 — End: 2021-12-24
  Administered 2021-12-22: 25 mg via ORAL
  Filled 2021-12-22: qty 1

## 2021-12-22 NOTE — Consult Note (Signed)
WOC Nurse Consult Note: Reason for Consult: NPWT dressing; left lateral foot Wound type: surgical; debridement 12/20/21; Dr. Posey Pronto Pressure Injury POA: NA Measurement: 8cm x 4cm x 1cm Wound bed: 80% pnk, fibrinous/slough proximal aspect of wound bed; linear white area (tendon?)  Drainage (amount, consistency, odor) scant in VAC canister; serosanguinous Periwound: peeling skin; area of darkness; linear on the dorsal foot; will follow up with surgeon about this. Great toe amputation with sutures intact  Dressing procedure/placement/frequency: Dr. Blenda Mounts had removed NPWT dressing this am.  Removed saline moist gauze dressing  Filled wound with  _1__ piece of black foam Used 1/4 of ostomy barrier ring between surgical wound and existing 4th toe.  Used 1/4 of ostomy barrier ring along the plantar surface of the surgical wound to aid in seal Sealed NPWT dressing at 156m HG Patient received PO pain medication per bedside nurse prior to dressing change Patient tolerated procedure well  WOC nurse will continue to provide NPWT dressing changed due to the complexity of the dressing change.    CM in the room; aware of the need for home NPWT, sounds as if may be going to SNF. She is aware for the need for the therapy/wound care.   MOxford CConyers CDupo

## 2021-12-22 NOTE — Progress Notes (Signed)
Physical Therapy Treatment Patient Details Name: [name] MRN: [id number] DOB: January 07, 1956 Today's Date: 12/22/2021   History of Present Illness 66 y.o. male presents to [facility] hospital on 12/14/2021 with L foot infection. Pt recently admitted in June, underwent angioplasty and stenting to L SFA on 6/5.   Pt underwent angiogram on 6/14, followed by L big toe and 5th ray amputation on 6/15. Pt underwent L lateral foot debridement on 6/17. 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    PT Comments    Pt supine in bed on arrival. Pt agreeable to PT session.  He is slow and guarded with poor sitting balance during dynamic activity.  Pt required use of sliding board from OOB to recliner.    Recommendations for follow up therapy are one component of a multi-disciplinary discharge planning process, led by the attending physician.  Recommendations may be updated based on patient status, additional functional criteria and insurance authorization.  Follow Up Recommendations  Skilled nursing-short term rehab (<3 hours/day)     Assistance Recommended at Discharge Intermittent Supervision/Assistance  Patient can return home with the following A lot of help with walking and/or transfers;A lot of help with bathing/dressing/bathroom;Assistance with cooking/housework;Assist for transportation;Help with stairs or ramp for entrance   Equipment Recommendations  Wheelchair (measurements PT);Wheelchair cushion (measurements PT)    Recommendations for Other Services       Precautions / Restrictions Precautions Precautions: Fall Precaution Comments: wound vac L foot Restrictions Weight Bearing Restrictions: Yes RLE Weight Bearing: Partial weight bearing (through R heel only) LLE Weight Bearing: Non weight bearing (wound vac in place)     Mobility  Bed Mobility Overal bed mobility: Needs Assistance Bed Mobility: Supine to Sit, Sit to Supine     Supine to sit: HOB  elevated, Min assist     General bed mobility comments: Pt required assistance for trunk, with minimal LOB but self correcting at times.    Transfers Overall transfer level: Needs assistance Equipment used: Sliding board Transfers: Bed to chair/wheelchair/BSC            Lateral/Scoot Transfers: Mod assist General transfer comment: Assistance to scoot from bed to recliner with poor trunk control and slow movement.  Use of bed pad to move from surface to surface.  Pt with poor activity tolerance and endurance.    Ambulation/Gait                   Stairs             Wheelchair Mobility    Modified Rankin (Stroke Patients Only)       Balance Overall balance assessment: Needs assistance Sitting-balance support: No upper extremity supported, Feet supported Sitting balance-Leahy Scale: Poor Sitting balance - Comments: static posture fair, dynamic  sitting balance poor.                                    Cognition Arousal/Alertness: Awake/alert Behavior During Therapy: WFL for tasks assessed/performed Overall Cognitive Status: Within Functional Limits for tasks assessed                                          Exercises      General Comments        Pertinent Vitals/Pain Pain Assessment Pain Assessment: No/denies  pain Pain Location: L foot Pain Descriptors / Indicators: Burning Pain Intervention(s): Repositioned    Home Living                          Prior Function            PT Goals (current goals can now be found in the care plan section) Acute Rehab PT Goals Patient Stated Goal: to improve mobility quality while maintaining WB precautions Potential to Achieve Goals: Fair Additional Goals Additional Goal #1: Pt will mobilize in a manual wheelchair with use of BUE at a supervision level to aide in improving independence in household mobility Progress towards PT goals: Progressing toward goals     Frequency    Min 3X/week      PT Plan Current plan remains appropriate    Co-evaluation              AM-PAC PT "6 Clicks" Mobility   Outcome Measure  Help needed turning from your back to your side while in a flat bed without using bedrails?: A Little Help needed moving from lying on your back to sitting on the side of a flat bed without using bedrails?: A Little Help needed moving to and from a bed to a chair (including a wheelchair)?: A Lot Help needed standing up from a chair using your arms (e.g., wheelchair or bedside chair)?: Total Help needed to walk in hospital room?: Total Help needed climbing 3-5 steps with a railing? : Total 6 Click Score: 11    End of Session Equipment Utilized During Treatment: Gait belt Activity Tolerance: Patient tolerated treatment well Patient left: in chair;with call bell/phone within reach;with chair alarm set (maxi move sling under patient's bottom) Nurse Communication: Mobility status;Need for lift equipment PT Visit Diagnosis: Other abnormalities of gait and mobility (R26.89);Muscle weakness (generalized) (M62.81)     Time: 0973-5329 PT Time Calculation (min) (ACUTE ONLY): 22 min  Charges:  $Therapeutic Activity: 8-22 mins                     Tzivia Oneil R. , PTA Acute Rehabilitation Services     Tramel [place] Eli Hose 12/22/2021, 4:16 PM

## 2021-12-22 NOTE — Progress Notes (Signed)
Pt wound vac is showing blockage, tubing unclamped, lowered to floo with no relief..call down to supply and floors for replacement canister...currently waiting for supply for new canister. Pt resting with no other concerns

## 2021-12-22 NOTE — Progress Notes (Signed)
[facility]                                                                               [name], is a 66 y.o. male, DOB - 1955-07-25, IOE:[id number] Admit date - 12/14/2021    Outpatient Primary MD for the patient is Harvie Junior, MD  LOS - 8  days    Brief summary   66 year old M with PMH of IDDM-2 with neuropathy and other complications, PVD s/p right below-knee popliteal artery to PTA bypass and right TMT amputation on 09/19/2021 and s/p left PTA PCI and stent to left SFA on 12/08/2021, COPD, OSA not on CPAP, HTN, HLD, GERD and depression presented with new pain and drainage from left small toe, subjective fever and decreased appetite.  He was on p.o. Keflex after recent hospitalization, which was discontinued by ID 4 days prior to presentation.  He was admitted with gangrenous left foot infection and PAD.  He was started on IV vancomycin, CTX and Flagyl.  Vascular surgery and podiatry consulted.     X-ray with subcutaneous soft tissue gas about left fifth metatarsal and metatarsophalangeal joint concerning for gas-forming infection.  MRI of left foot without contrast shows soft tissue ulceration and findings suggestive for acute osteomyelitis of fifth metatarsal head, fifth metatarsal shaft, distal phalanx of left great toe and mild extensor digitorum brevis tenosynovitis.   LE Doppler on 6/12 concerning for some stenosis in SFA, DFA and PT with monophasic flow throughout.  ABI with moderate right and mild left PAD improved from prior ABI.  Had aortogram on 6/14 that showed maximal revascularization without intervention.     Patient underwent left big toe amputation and left 5th ray partial amputation on 6/15, and left lateral foot excisional debridement with washout and wound VAC application on 1/82.  Surgical culture with ESBL E. coli. Infectious disease consulted.  He will be NWB on left foot.    Assessment & Plan    Assessment and Plan:  Severe sepsis  present on admission with tachycardia leukocytosis, lactic acidosis, fever and AKI Acute osteomyelitis of the left foot, diabetic foot ulcer in the setting of uncontrolled diabetes mellitus. Left great toe gangrenous changes in the setting of bilateral peripheral artery disease Blood cultures negative till date. Aortogram with maximally revascularized left lower extremity without intervention on June 14. Patient underwent left big toe amputation and left fifth ray partial amputation by Dr. Posey Pronto on 12/18/2021. He also underwent left lateral foot excisional debridement with washout and wound VAC application on 9/93/7169.  Patient on broad-spectrum IV antibiotics, ID on board and appreciate recommendations. Plan for oral augmentin for 7 day s on discharge. Wound vac taken off today. Podiatry recommends MWF wound VAC changes, and outpatient follow up in one week from discharge.  Nonweightbearing on the left foot. Continue with aspirin Plavix and Lipitor. Therapy evaluations recommending SNF on discharge.  Uncontrolled type 2 diabetes mellitus insulin-dependent with hyperglycemia.  -currently on 20 units of semglee BID, will add 2 units of novolog TIDAC. -Continue Lipitor 80 mg daily -Increased gabapentin as above. -CBG (last 3)  Recent Labs    12/21/21 1626 12/21/21 2117 12/22/21 0743  GLUCAP  207* 243* 153*    Continue with SSI.    AKI Resolved   Anemia of chronic disease/normocytic anemia Hemoglobin stable around 10 .     Hypertension Blood pressure parameters are optimal, continue with amlodipine and Lopressor. Hold lisinopril in view of recent AKI.  Hyperlipidemia Continue with statin.   Thrombocytosis probably secondary to sepsis Continue to monitor   Depression/mood disorder Continue with home medications    GERD stable continue with PPI.   Leukocytosis:  Recheck cbc today.      Malnutrition Type:  Nutrition Problem: Increased nutrient needs Etiology:  wound healing   Malnutrition Characteristics:  Signs/Symptoms: estimated needs   Nutrition Interventions:  Interventions: Ensure Enlive (each supplement provides 350kcal and 20 grams of protein), Prostat  Estimated body mass index is 26.91 kg/m as calculated from the following:   Height as of this encounter: 6' (1.829 m).   Weight as of this encounter: 90 kg.  Code Status: Full code DVT Prophylaxis:  heparin injection 5,000 Units Start: 12/14/21 2200   Level of Care: Level of care: Med-Surg Family Communication: None at bedside  Disposition Plan:     Remains inpatient appropriate: SNF     Consultants:   Vascular surgery Podiatry ID  Antimicrobials:   Anti-infectives (From admission, onward)    Start     Dose/Rate Route Frequency Ordered Stop   12/20/21 1715  meropenem (MERREM) 1 g in sodium chloride 0.9 % 100 mL IVPB        1 g 200 mL/hr over 30 Minutes Intravenous Every 8 hours 12/20/21 1619     12/19/21 2200  vancomycin (VANCOREADY) IVPB 1250 mg/250 mL  Status:  Discontinued        1,250 mg 166.7 mL/hr over 90 Minutes Intravenous Every 12 hours 12/19/21 1040 12/20/21 1615   12/16/21 1400  ceFEPIme (MAXIPIME) 2 g in sodium chloride 0.9 % 100 mL IVPB  Status:  Discontinued        2 g 200 mL/hr over 30 Minutes Intravenous Every 8 hours 12/16/21 1259 12/20/21 1615   12/16/21 0200  vancomycin (VANCOREADY) IVPB 1500 mg/300 mL  Status:  Discontinued        1,500 mg 150 mL/hr over 120 Minutes Intravenous Every 24 hours 12/15/21 1108 12/19/21 1040   12/15/21 2200  vancomycin (VANCOREADY) IVPB 1250 mg/250 mL  Status:  Discontinued        1,250 mg 166.7 mL/hr over 90 Minutes Intravenous Every 24 hours 12/14/21 2022 12/15/21 1108   12/14/21 2100  metroNIDAZOLE (FLAGYL) IVPB 500 mg  Status:  Discontinued        500 mg 100 mL/hr over 60 Minutes Intravenous Every 12 hours 12/14/21 2022 12/20/21 1615   12/14/21 2030  vancomycin (VANCOREADY) IVPB 2000 mg/400 mL        2,000  mg 200 mL/hr over 120 Minutes Intravenous  Once 12/14/21 2015 12/15/21 0516   12/14/21 2000  cefTRIAXone (ROCEPHIN) 2 g in sodium chloride 0.9 % 100 mL IVPB  Status:  Discontinued        2 g 200 mL/hr over 30 Minutes Intravenous Every 24 hours 12/14/21 1948 12/16/21 1242   12/14/21 2000  metroNIDAZOLE (FLAGYL) IVPB 500 mg  Status:  Discontinued        500 mg 100 mL/hr over 60 Minutes Intravenous Every 8 hours 12/14/21 1948 12/14/21 2022        Medications  Scheduled Meds:  (feeding supplement) PROSource Plus  30 mL Oral Daily   amLODipine  5  mg Oral q morning   aspirin EC  81 mg Oral Daily   atorvastatin  80 mg Oral QHS   buPROPion  300 mg Oral q morning   cholecalciferol  1,000 Units Oral Daily   clopidogrel  75 mg Oral Daily   feeding supplement  237 mL Oral BID BM   fluticasone furoate-vilanterol  1 puff Inhalation Daily   gabapentin  300 mg Oral [id number]   And   gabapentin  600 mg Oral QHS   heparin  5,000 Units Subcutaneous Q8H   insulin aspart  0-5 Units Subcutaneous QHS   insulin aspart  0-9 Units Subcutaneous TID WC   insulin glargine-yfgn  20 Units Subcutaneous BID   lurasidone  40 mg Oral Q supper   metoprolol tartrate  50 mg Oral BID   mupirocin ointment  1 application  Nasal BID   OLANZapine  2.5 mg Oral QHS   OXcarbazepine  150 mg Oral BID   pantoprazole  40 mg Oral QHS   sodium chloride flush  3 mL Intravenous Q12H   Continuous Infusions:  sodium chloride 100 mL/hr at 12/20/21 1001   sodium chloride     meropenem (MERREM) IV 1 g (12/22/21 0506)   PRN Meds:.sodium chloride, acetaminophen **OR** acetaminophen, albuterol, hydrALAZINE, HYDROcodone-acetaminophen, labetalol, meclizine, ondansetron **OR** ondansetron (ZOFRAN) IV, ondansetron (ZOFRAN) IV, oxyCODONE, senna-docusate, sodium chloride flush    Subjective:   [name] was seen and examined today.  No new complaints today. Looking forward to being discharged.  Objective:   Vitals:    12/21/21 1000 12/21/21 2005 12/22/21 0500 12/22/21 0743  BP: (!) 149/70 124/73  (!) 150/92  Pulse: 77 72  70  Resp: 18 20    Temp: 98.2 F (36.8 C) 98.8 F (37.1 C)  98.8 F (37.1 C)  TempSrc: Oral Oral  Oral  SpO2: 95% 100%  98%  Weight:   90 kg   Height:        Intake/Output Summary (Last 24 hours) at 12/22/2021 0827 Last data filed at 12/22/2021 0600 Gross per 24 hour  Intake 240 ml  Output 1650 ml  Net -1410 ml    Filed Weights   12/19/21 0424 12/20/21 0702 12/22/21 0500  Weight: 95.1 kg 95.1 kg 90 kg     Exam General exam: Appears calm and comfortable  Respiratory system: Clear to auscultation. Respiratory effort normal. Cardiovascular system: S1 & S2 heard, RRR. No JVD, No pedal edema. Gastrointestinal system: Abdomen is nondistended, soft and nontender.Normal bowel sounds heard. Central nervous system: Alert and oriented. No focal neurological deficits. Extremities: S/p right TMT amputation, dressing over the right foot wound VAC over the left foot. Skin: No rashes, lesions or ulcers Psychiatry: Judgement and insight appear normal. Mood & affect appropriate.      Data Reviewed:  I have personally reviewed following labs and imaging studies   CBC Lab Results  Component Value Date   WBC 15.2 (H) 12/21/2021   RBC 4.08 (L) 12/21/2021   HGB 10.3 (L) 12/21/2021   HCT 30.8 (L) 12/21/2021   MCV 75.5 (L) 12/21/2021   MCH 25.2 (L) 12/21/2021   PLT 746 (H) 12/21/2021   MCHC 33.4 12/21/2021   RDW 15.0 12/21/2021   LYMPHSABS 1.7 12/14/2021   MONOABS 1.9 (H) 12/14/2021   EOSABS 0.1 12/14/2021   BASOSABS 0.1 82/95/6213     Last metabolic panel Lab Results  Component Value Date   NA 137 12/21/2021   K 3.7 12/21/2021   CL 105 12/21/2021  CO2 23 12/21/2021   BUN 12 12/21/2021   CREATININE 0.71 12/21/2021   GLUCOSE 215 (H) 12/21/2021   GFRNONAA >60 12/21/2021   GFRAA >60 02/17/2015   CALCIUM 8.3 (L) 12/21/2021   PHOS 2.1 (L) 12/21/2021   PROT 8.0  12/14/2021   ALBUMIN 1.9 (L) 12/21/2021   BILITOT 0.6 12/14/2021   ALKPHOS 82 12/14/2021   AST 21 12/14/2021   ALT 22 12/14/2021   ANIONGAP 9 12/21/2021    CBG (last 3)  Recent Labs    12/21/21 1626 12/21/21 2117 12/22/21 0743  GLUCAP 207* 243* 153*       Coagulation Profile: No results for input(s): "INR", "PROTIME" in the last 168 hours.   Radiology Studies: No results found.     Hosie Poisson M.D. [facility] 12/22/2021, 8:27 AM  Available via Epic secure chat 7am-7pm After 7 pm, please refer to night coverage provider listed on amion.

## 2021-12-22 NOTE — Progress Notes (Signed)
Subjective:  Patient ID: [name], male    DOB: 1956-01-01,  MRN: [id number]  Patient seen at bedside this morning s/p left lateral foot excisional debridement with application of wound vac. (12/20/21) S/p left hallux amputation and partial left fifth ray (12/18/21). Patient relates he is doing well and not having as much pain. Vac beeping when entered room with blockage noted.  Denies any other complaints this morning.   Past Medical History:  Diagnosis Date   Diabetes mellitus with peripheral vascular disease    Hypertension    Neuropathy    PTSD (post-traumatic stress disorder)    Sleep apnea      Past Surgical History:  Procedure Laterality Date   ABDOMINAL AORTOGRAM W/LOWER EXTREMITY Right 09/10/2021   Procedure: ABDOMINAL AORTOGRAM W/LOWER EXTREMITY;  Surgeon: [name], MD;  Location: [place] [facility];  Service: Cardiovascular;  Laterality: Right;   ABDOMINAL AORTOGRAM W/LOWER EXTREMITY N/A 12/17/2021   Procedure: ABDOMINAL AORTOGRAM W/LOWER EXTREMITY;  Surgeon: [name], MD;  Location: [place] [facility];  Service: Cardiovascular;  Laterality: N/A;   ABDOMINAL AORTOGRAM W/LOWER EXTREMITY N/A 12/03/2021   Procedure: ABDOMINAL AORTOGRAM W/LOWER EXTREMITY;  Surgeon: [name], MD;  Location: [place] [facility];  Service: Cardiovascular;  Laterality: N/A;   ABDOMINAL HERNIA REPAIR     AMPUTATION Right 09/19/2021   Procedure: TRANSMETATARSAL AMPUTATION FOOT;  Surgeon: Lorenda Peck, MD;  Location: [place];  Service: Podiatry;  Laterality: Right;  Surgical team to do local block   AMPUTATION Left 12/20/2021   Procedure: LEFT FOOT REVISION PARTIAL FIRST RAY AMPUTATION WITH PLACEMENT OF PRESSURE WOUND VAC;  Surgeon: [name], DPM;  Location: [place];  Service: Podiatry;  Laterality: Left;   AMPUTATION TOE Right 09/15/2021   Procedure: AMPUTATION 4th TOE;  Surgeon: [name], MD;  Location: [place];  Service: Vascular;  Laterality: Right;   AMPUTATION TOE  Left 12/18/2021   Procedure: AMPUTATION LEFT BIG TOE; LEFT 5TH RAY PARTIAL AMPUTATION;  Surgeon: [name], DPM;  Location: [place];  Service: Podiatry;  Laterality: Left;   BACK SURGERY     CORONARY ATHERECTOMY N/A 12/08/2021   Procedure: CORONARY ATHERECTOMY;  Surgeon: Waynetta Sandy, MD;  Location: [place] [facility];  Service: Cardiovascular;  Laterality: N/A;  Laser - Lt. PT   FEMORAL-TIBIAL BYPASS GRAFT Right 09/15/2021   Procedure: BYPASS GRAFT BELOW KNEE POPLITEAL ARTERY TO POSTERIOR TIBIAL ARTERY;  Surgeon: [name], MD;  Location: [place];  Service: Vascular;  Laterality: Right;   IRRIGATION AND DEBRIDEMENT FOOT Right 09/17/2021   Procedure: IRRIGATION AND DEBRIDEMENT FOOT;  Surgeon: Lorenda Peck, MD;  Location: [place];  Service: Podiatry;  Laterality: Right;  Suregeon will do anesthesia block   KNEE SURGERY     LOWER EXTREMITY ANGIOGRAPHY Left 12/08/2021   Procedure: Lower Extremity Angiography;  Surgeon: Waynetta Sandy, MD;  Location: [place] [facility];  Service: Cardiovascular;  Laterality: Left;   PERIPHERAL VASCULAR BALLOON ANGIOPLASTY  09/10/2021   Procedure: PERIPHERAL VASCULAR BALLOON ANGIOPLASTY;  Surgeon: [name], MD;  Location: [place] [facility];  Service: Cardiovascular;;  rt sfa pta   PERIPHERAL VASCULAR BALLOON ANGIOPLASTY Right 12/03/2021   Procedure: PERIPHERAL VASCULAR BALLOON ANGIOPLASTY;  Surgeon: [name], MD;  Location: [place] [facility];  Service: Cardiovascular;  Laterality: Right;   PERIPHERAL VASCULAR INTERVENTION Left 12/08/2021   Procedure: PERIPHERAL VASCULAR INTERVENTION;  Surgeon: Waynetta Sandy, MD;  Location: [place] [facility];  Service: Cardiovascular;  Laterality: Left;  LT. SFA   SHOULDER SURGERY     VEIN HARVEST Right 09/15/2021   Procedure: GREATER SAPHENOUS VEIN HARVEST;  Surgeon: [name], MD;  Location: [facility];  Service: Vascular;  Laterality: Right;       Latest Ref Rng & Units  12/21/2021    3:53 AM 12/20/2021    2:15 AM 12/19/2021    7:54 AM  CBC  WBC 4.0 - 10.5 K/uL 15.2  13.0  16.0   Hemoglobin 13.0 - 17.0 g/dL 10.3  10.0  10.1   Hematocrit 39.0 - 52.0 % 30.8  30.8  31.5   Platelets 150 - 400 K/uL 746  649  618        Latest Ref Rng & Units 12/21/2021    3:53 AM 12/20/2021    2:15 AM 12/19/2021    7:54 AM  BMP  Glucose 70 - 99 mg/dL 215  192  165   BUN 8 - 23 mg/dL '12  8  7   '$ Creatinine 0.61 - 1.24 mg/dL 0.71  0.62  0.76   Sodium 135 - 145 mmol/L 137  132  135   Potassium 3.5 - 5.1 mmol/L 3.7  3.7  3.4   Chloride 98 - 111 mmol/L 105  105  104   CO2 22 - 32 mmol/L '23  20  22   '$ Calcium 8.9 - 10.3 mg/dL 8.3  7.8  8.2      Objective:   Vitals:   12/21/21 2005 12/22/21 0743  BP: 124/73 (!) 150/92  Pulse: 72 70  Resp: 20   Temp: 98.8 F (37.1 C) 98.8 F (37.1 C)  SpO2: 100% 98%    General:AA&O x 3. Normal mood and affect   Vascular: DP and PT pulses 2/4 bilateral. Brisk capillary refill to all digits. Pedal hair present   Neruological. Epicritic sensation grossly intact.   Derm: Left foot medial incision well coapted. No erythema edema or purulence noted. Lateral fifth metatarsal incision with mostly granular base and some areas of fibrosis. Periwound no erythema or edema noted No purulence and no malodor noted.  Right foot wound remains stable.   MSK: MMT 5/5 in dorsiflexion, plantar flexion, inversion and eversion. Normal joint ROM without pain or crepitus.       Assessment & Plan:  Patient was evaluated and treated and all questions answered.  S/p left hallux amputation and left fifth ray partial amputation with staged debridement.  Patient is ok for discharge from podiatry standpoint.  Appreciate ID recommendations.  Wound VAC taken down this morning. There were no dressing supplies or new canisters on floor today. Once those can be brought to floor. Will continue with MWF wound VAC changes. In meantime dressed with Saline WTD twice  daily.  Will be NWB to left lower extremity.  Waiting on placement in SNF.  Will follow-up in one week from discharge in our clinic.     Lorenda Peck, DPM  Accessible via secure chat for questions or concerns.

## 2021-12-23 DIAGNOSIS — N179 Acute kidney failure, unspecified: Secondary | ICD-10-CM | POA: Diagnosis not present

## 2021-12-23 DIAGNOSIS — M86142 Other acute osteomyelitis, left hand: Secondary | ICD-10-CM | POA: Diagnosis not present

## 2021-12-23 DIAGNOSIS — I96 Gangrene, not elsewhere classified: Secondary | ICD-10-CM | POA: Diagnosis not present

## 2021-12-23 DIAGNOSIS — F32A Depression, unspecified: Secondary | ICD-10-CM | POA: Diagnosis not present

## 2021-12-23 LAB — GLUCOSE, CAPILLARY
Glucose-Capillary: 181 mg/dL — ABNORMAL HIGH (ref 70–99)
Glucose-Capillary: 251 mg/dL — ABNORMAL HIGH (ref 70–99)
Glucose-Capillary: 281 mg/dL — ABNORMAL HIGH (ref 70–99)
Glucose-Capillary: 286 mg/dL — ABNORMAL HIGH (ref 70–99)
Glucose-Capillary: 268 mg/dL — ABNORMAL HIGH (ref 70–99)

## 2021-12-23 LAB — SURGICAL PATHOLOGY

## 2021-12-23 MED ORDER — AMOXICILLIN-POT CLAVULANATE 875-125 MG PO TABS
1.0000 | ORAL_TABLET | Freq: Two times a day (BID) | ORAL | Status: DC
  Administered 2021-12-23: 1 via ORAL
  Filled 2021-12-23 (×2): qty 1

## 2021-12-23 NOTE — Anesthesia Postprocedure Evaluation (Signed)
Anesthesia Post Note  Patient: Lytle A Buitron  Procedure(s) Performed: LEFT FOOT REVISION PARTIAL FIRST RAY AMPUTATION WITH PLACEMENT OF PRESSURE WOUND VAC (Left: Foot)     Patient location during evaluation: PACU Anesthesia Type: General Level of consciousness: awake and alert Pain management: pain level controlled Vital Signs Assessment: post-procedure vital signs reviewed and stable Respiratory status: spontaneous breathing, nonlabored ventilation, respiratory function stable and patient connected to nasal cannula oxygen Cardiovascular status: blood pressure returned to baseline and stable Postop Assessment: no apparent nausea or vomiting Anesthetic complications: no   No notable events documented.  Last Vitals:  Vitals:   12/22/21 2158 12/23/21 1058  BP: (!) 149/71 117/85  Pulse: 74 73  Resp: 20 18  Temp: 37.5 C 36.6 C  SpO2: 97% 97%    Last Pain:  Vitals:   12/23/21 1331  TempSrc:   PainSc: 2                  Laila Myhre

## 2021-12-23 NOTE — Progress Notes (Signed)
[facility]                                                                               [name], is a 66 y.o. male, DOB - 12/09/1955, PNT:[id number] Admit date - 12/14/2021    Outpatient Primary MD for the patient is Harvie Junior, MD  LOS - 9  days    Brief summary   66 year old M with PMH of IDDM-2 with neuropathy and other complications, PVD s/p right below-knee popliteal artery to PTA bypass and right TMT amputation on 09/19/2021 and s/p left PTA PCI and stent to left SFA on 12/08/2021, COPD, OSA not on CPAP, HTN, HLD, GERD and depression presented with new pain and drainage from left small toe, subjective fever and decreased appetite.  He was on p.o. Keflex after recent hospitalization, which was discontinued by ID 4 days prior to presentation.  He was admitted with gangrenous left foot infection and PAD.  He was started on IV vancomycin, CTX and Flagyl.  Vascular surgery and podiatry consulted.     X-ray with subcutaneous soft tissue gas about left fifth metatarsal and metatarsophalangeal joint concerning for gas-forming infection.  MRI of left foot without contrast shows soft tissue ulceration and findings suggestive for acute osteomyelitis of fifth metatarsal head, fifth metatarsal shaft, distal phalanx of left great toe and mild extensor digitorum brevis tenosynovitis.   LE Doppler on 6/12 concerning for some stenosis in SFA, DFA and PT with monophasic flow throughout.  ABI with moderate right and mild left PAD improved from prior ABI.  Had aortogram on 6/14 that showed maximal revascularization without intervention.     Patient underwent left big toe amputation and left 5th ray partial amputation on 6/15, and left lateral foot excisional debridement with washout and wound VAC application on 0/86.  Surgical culture with ESBL E. coli. Infectious disease consulted.  He will be NWB on left foot.    Assessment & Plan    Assessment and Plan:  Severe sepsis  present on admission with tachycardia leukocytosis, lactic acidosis, fever and AKI Acute osteomyelitis of the left foot, diabetic foot ulcer in the setting of uncontrolled diabetes mellitus. Left great toe gangrenous changes in the setting of bilateral peripheral artery disease Blood cultures negative till date. Aortogram with maximally revascularized left lower extremity without intervention on June 14. Patient underwent left big toe amputation and left fifth ray partial amputation by Dr. Posey Pronto on 12/18/2021. He also underwent left lateral foot excisional debridement with washout and wound VAC application on 7/61/9509.  Patient on broad-spectrum IV antibiotics, ID on board and appreciate recommendations. Plan for oral augmentin for 7 days on discharge. Pt stable for discharge. Currently waiting for SNF Placement.  Wound vac taken off today. Podiatry recommends MWF wound VAC changes, and outpatient follow up in one week from discharge.  Nonweightbearing on the left foot. Continue with aspirin Plavix and Lipitor. Therapy evaluations recommending SNF on discharge.  Uncontrolled type 2 diabetes mellitus insulin-dependent with hyperglycemia.  -currently on 20 units of semglee BID, will add 2 units of novolog TIDAC. -Continue Lipitor 80 mg daily -Increased gabapentin as above. -CBG (last 3)  Recent Labs  12/22/21 2156 12/23/21 0811 12/23/21 1230  GLUCAP 288* 181* 251*    Continue with SSI.    AKI Resolved   Anemia of chronic disease/normocytic anemia Hemoglobin stable around 10 .     Hypertension Blood pressure parameters are optimal., continue with amlodipine and Lopressor. Hold lisinopril in view of recent AKI.  Hyperlipidemia Continue with statin.   Thrombocytosis probably secondary to sepsis Continue to monitor   Depression/mood disorder Continue with home medications    GERD stable continue with PPI.   Leukocytosis:  Improving.      Malnutrition  Type:  Nutrition Problem: Increased nutrient needs Etiology: wound healing   Malnutrition Characteristics:  Signs/Symptoms: estimated needs   Nutrition Interventions:  Interventions: Ensure Enlive (each supplement provides 350kcal and 20 grams of protein), Prostat  Estimated body mass index is 26.91 kg/m as calculated from the following:   Height as of this encounter: 6' (1.829 m).   Weight as of this encounter: 90 kg.  Code Status: Full code DVT Prophylaxis:  heparin injection 5,000 Units Start: 12/14/21 2200   Level of Care: Level of care: Med-Surg Family Communication: None at bedside  Disposition Plan:     Remains inpatient appropriate: SNF     Consultants:   Vascular surgery Podiatry ID  Antimicrobials:   Anti-infectives (From admission, onward)    Start     Dose/Rate Route Frequency Ordered Stop   12/20/21 1715  meropenem (MERREM) 1 g in sodium chloride 0.9 % 100 mL IVPB        1 g 200 mL/hr over 30 Minutes Intravenous Every 8 hours 12/20/21 1619     12/19/21 2200  vancomycin (VANCOREADY) IVPB 1250 mg/250 mL  Status:  Discontinued        1,250 mg 166.7 mL/hr over 90 Minutes Intravenous Every 12 hours 12/19/21 1040 12/20/21 1615   12/16/21 1400  ceFEPIme (MAXIPIME) 2 g in sodium chloride 0.9 % 100 mL IVPB  Status:  Discontinued        2 g 200 mL/hr over 30 Minutes Intravenous Every 8 hours 12/16/21 1259 12/20/21 1615   12/16/21 0200  vancomycin (VANCOREADY) IVPB 1500 mg/300 mL  Status:  Discontinued        1,500 mg 150 mL/hr over 120 Minutes Intravenous Every 24 hours 12/15/21 1108 12/19/21 1040   12/15/21 2200  vancomycin (VANCOREADY) IVPB 1250 mg/250 mL  Status:  Discontinued        1,250 mg 166.7 mL/hr over 90 Minutes Intravenous Every 24 hours 12/14/21 2022 12/15/21 1108   12/14/21 2100  metroNIDAZOLE (FLAGYL) IVPB 500 mg  Status:  Discontinued        500 mg 100 mL/hr over 60 Minutes Intravenous Every 12 hours 12/14/21 2022 12/20/21 1615   12/14/21  2030  vancomycin (VANCOREADY) IVPB 2000 mg/400 mL        2,000 mg 200 mL/hr over 120 Minutes Intravenous  Once 12/14/21 2015 12/15/21 0516   12/14/21 2000  cefTRIAXone (ROCEPHIN) 2 g in sodium chloride 0.9 % 100 mL IVPB  Status:  Discontinued        2 g 200 mL/hr over 30 Minutes Intravenous Every 24 hours 12/14/21 1948 12/16/21 1242   12/14/21 2000  metroNIDAZOLE (FLAGYL) IVPB 500 mg  Status:  Discontinued        500 mg 100 mL/hr over 60 Minutes Intravenous Every 8 hours 12/14/21 1948 12/14/21 2022        Medications  Scheduled Meds:  amLODipine  5 mg Oral q morning  aspirin EC  81 mg Oral Daily   atorvastatin  80 mg Oral QHS   buPROPion  300 mg Oral q morning   cholecalciferol  1,000 Units Oral Daily   clopidogrel  75 mg Oral Daily   feeding supplement  237 mL Oral BID BM   fluticasone furoate-vilanterol  1 puff Inhalation Daily   gabapentin  300 mg Oral [id number]   And   gabapentin  600 mg Oral QHS   heparin  5,000 Units Subcutaneous Q8H   insulin aspart  0-5 Units Subcutaneous QHS   insulin aspart  0-9 Units Subcutaneous TID WC   insulin glargine-yfgn  20 Units Subcutaneous BID   lurasidone  40 mg Oral Q supper   metoprolol tartrate  50 mg Oral BID   OLANZapine  2.5 mg Oral QHS   OXcarbazepine  150 mg Oral BID   pantoprazole  40 mg Oral QHS   sodium chloride flush  3 mL Intravenous Q12H   Continuous Infusions:  sodium chloride     meropenem (MERREM) IV 1 g (12/23/21 1336)   PRN Meds:.sodium chloride, acetaminophen **OR** acetaminophen, albuterol, diphenhydrAMINE, hydrALAZINE, HYDROcodone-acetaminophen, labetalol, meclizine, ondansetron **OR** ondansetron (ZOFRAN) IV, ondansetron (ZOFRAN) IV, oxyCODONE, senna-docusate, sodium chloride flush    Subjective:   [name] was seen and examined today. Itching of the feet improved.  No new complaints. Looking forward to be discharged. Pain is getting better  Objective:   Vitals:   12/22/21 0500 12/22/21 0743  12/22/21 2158 12/23/21 1058  BP:  (!) 150/92 (!) 149/71 117/85  Pulse:  70 74 73  Resp:   20 18  Temp:  98.8 F (37.1 C) 99.5 F (37.5 C) 97.9 F (36.6 C)  TempSrc:  Oral Oral Oral  SpO2:  98% 97% 97%  Weight: 90 kg     Height:        Intake/Output Summary (Last 24 hours) at 12/23/2021 1356 Last data filed at 12/23/2021 0900 Gross per 24 hour  Intake 840 ml  Output 1975 ml  Net -1135 ml    Filed Weights   12/19/21 0424 12/20/21 0702 12/22/21 0500  Weight: 95.1 kg 95.1 kg 90 kg     Exam General exam: Appears calm and comfortable  Respiratory system: Clear to auscultation. Respiratory effort normal. Cardiovascular system: S1 & S2 heard, RRR.  No pedal edema. Gastrointestinal system: Abdomen is nondistended, soft and nontender. Normal bowel sounds heard. Central nervous system: Alert and oriented. No focal neurological deficits. Extremities: wound vac on the left foot. Right foot bandaged.  Skin: No rashes, lesions or ulcers Psychiatry:  Mood & affect appropriate.      Data Reviewed:  I have personally reviewed following labs and imaging studies   CBC Lab Results  Component Value Date   WBC 11.7 (H) 12/22/2021   RBC 4.22 12/22/2021   HGB 10.5 (L) 12/22/2021   HCT 32.9 (L) 12/22/2021   MCV 78.0 (L) 12/22/2021   MCH 24.9 (L) 12/22/2021   PLT 805 (H) 12/22/2021   MCHC 31.9 12/22/2021   RDW 15.3 12/22/2021   LYMPHSABS 3.0 12/22/2021   MONOABS 1.1 (H) 12/22/2021   EOSABS 0.7 (H) 12/22/2021   BASOSABS 0.1 56/38/9373     Last metabolic panel Lab Results  Component Value Date   NA 137 12/21/2021   K 3.7 12/21/2021   CL 105 12/21/2021   CO2 23 12/21/2021   BUN 12 12/21/2021   CREATININE 0.71 12/21/2021   GLUCOSE 215 (H) 12/21/2021   GFRNONAA >60  12/21/2021   GFRAA >60 02/17/2015   CALCIUM 8.3 (L) 12/21/2021   PHOS 2.1 (L) 12/21/2021   PROT 8.0 12/14/2021   ALBUMIN 1.9 (L) 12/21/2021   BILITOT 0.6 12/14/2021   ALKPHOS 82 12/14/2021   AST 21 12/14/2021    ALT 22 12/14/2021   ANIONGAP 9 12/21/2021    CBG (last 3)  Recent Labs    12/22/21 2156 12/23/21 0811 12/23/21 1230  GLUCAP 288* 181* 251*       Coagulation Profile: No results for input(s): "INR", "PROTIME" in the last 168 hours.   Radiology Studies: No results found.     Hosie Poisson M.D. [facility] 12/23/2021, 1:56 PM  Available via Epic secure chat 7am-7pm After 7 pm, please refer to night coverage provider listed on amion.

## 2021-12-23 NOTE — TOC Progression Note (Addendum)
Transition of Care [facility]) - Progression Note    Patient Details  Name: [name] MRN: [id number] Date of Birth: 04-Oct-1955  Transition of Care [facility]) CM/SW [place], RN Phone Number: 12/23/2021, 10:31 AM  Clinical Narrative:    CM spoke with Perrin Smack, CM at [facility] and the facility states that the patient has an outstanding balance at the facility for $11,000 that would need to be paid by the patient before they are able to offer him an admission bed.  I spoke with the patient and he is unable to afford to pay the bill off at this time.  The patient has 69 days of Medicare days left for SNF admission that would be covered 100% at the accepting facility.  The patient was offered Medicare choice for placement and he was agreeable to accept the bed offer for placement at [facility] SNF facility.  12/23/21 - I called and spoke with Perrin Smack, CM at [facility] again and the patient does not owe 11,000 but rather 1,100.00 to the facility.  The patient states that he will most likely be able to pay the owed amount to the facility upon admission.  Perrin Smack, CM at [facility] states that they facility is able to offer an admission bed to the patient tomorrow as long as insurance authorization is complete through [facility].  Clinicals were uploaded into [place] and authorization was started by [place], MSW and [employer] # is [id number] - status is pending at this time for placement at [facility].  CM and MSW with DTP Team will continue to follow the patient for SNF placement - possible [facility].   Expected Discharge Plan: Skilled Nursing Facility Barriers to Discharge: Ship broker, Continued Medical Work up  Expected Discharge Plan and Services Expected Discharge Plan: [place] In-house Referral: Clinical Social Work Discharge Planning Services: CM Consult Post Acute Care Choice: [place] Living arrangements  for the past 2 months: [place] Determinants of Health (SDOH) Interventions    Readmission Risk Interventions    12/22/2021   10:38 AM  Readmission Risk Prevention Plan  Transportation Screening Complete  Medication Review (RN Care Manager) Complete  PCP or Specialist appointment within 3-5 days of discharge Complete  HRI or Home Care Consult Complete  SW Recovery Care/Counseling Consult Complete  Palliative Care Screening Not [place] Complete

## 2021-12-23 NOTE — Progress Notes (Addendum)
Nutrition Follow-up  DOCUMENTATION CODES:   Not applicable  INTERVENTION:   Continue Ensure Enlive po BID, each supplement provides 350 kcal and 20 grams of protein.  D/C Prosource Plus.  NUTRITION DIAGNOSIS:   Increased nutrient needs related to wound healing as evidenced by estimated needs.  Ongoing   GOAL:   Patient will meet greater than or equal to 90% of their needs  Progressing   MONITOR:   PO intake, Supplement acceptance, Labs, Weight trends, Skin, I & O's  REASON FOR ASSESSMENT:   Consult Wound healing  ASSESSMENT:   66 year old M with PMH of IDDM-2 with neuropathy and other complications, PVD s/p right below-knee popliteal artery to PTA bypass and right TMT amputation on 09/19/2021 and s/p left PTA PCI and stent to left SFA on 12/08/2021, COPD, OSA not on CPAP, HTN, HLD, GERD and depression presented with new pain and drainage from left small toe. Pt admitted with gangrenous left foot infection and PAD.  6/17 - Left lateral foot excisional debridement with washout and application of negative pressure wound therapy. VAC output 50 ml x 24 hours.  Plans for D/C to SNF soon.  Patient reports good appetite and good intake of meals.  He is drinking 1-2 Ensure supplements every day. He has not been taking the Prosource Plus supplements; RD to d/c.   Currently on a carb modified diet. Meal intakes: 75-100%  New weight 90 kg 6/19. Patient has lost 9% of his usual weight within the past month.  Labs reviewed. Phos 2.1 (L) on 6/18 CBG: 181-251  Medications reviewed and include cholecalciferol, Novolog, Semglee.  Diet Order:   Diet Order             Diet Carb Modified Fluid consistency: Thin; Room service appropriate? Yes  Diet effective now                   EDUCATION NEEDS:   Not appropriate for education at this time  Skin:  Skin Assessment: Skin Integrity Issues: Skin Integrity Issues:: Other (Comment) Other: wound R foot, gangrene L  foot  Last BM:  6/20  Height:   Ht Readings from Last 1 Encounters:  12/20/21 6' (1.829 m)    Weight:   Wt Readings from Last 1 Encounters:  12/22/21 90 kg    BMI:  Body mass index is 26.91 kg/m.  Estimated Nutritional Needs:   Kcal:  2200-2500  Protein:  115-130 grams  Fluid:  >/= 2 L/day    Lucas Mallow RD, LDN, CNSC Please refer to Amion for contact information.

## 2021-12-23 NOTE — Progress Notes (Signed)
TRH night cross cover note:   I was notified by RN that the patient is reporting mild pruritus involving the bilateral feet, in the absence of new rash, and conveys the patient's request for order for prn Benadryl, which I subsequently ordered for him.       Babs Bertin, DO Hospitalist

## 2021-12-23 NOTE — Progress Notes (Signed)
Inpatient Diabetes Program Recommendations  AACE/ADA: New Consensus Statement on Inpatient Glycemic Control (2015)  Target Ranges:  Prepandial:   less than 140 mg/dL      Peak postprandial:   less than 180 mg/dL (1-2 hours)      Critically ill patients:  140 - 180 mg/dL   Lab Results  Component Value Date   GLUCAP 251 (H) 12/23/2021   HGBA1C 8.0 (H) 12/17/2021    Review of Glycemic Control  Latest Reference Range & Units 12/22/21 11:46 12/22/21 16:07 12/22/21 21:56 12/23/21 08:11 12/23/21 12:30  Glucose-Capillary 70 - 99 mg/dL 210 (H) 207 (H) 288 (H) 181 (H) 251 (H)   Diabetes history: DM 2 Outpatient Diabetes medications:  Jardiance 25 mg daily Novolog 70/30 mix 80 units in the AM and 40 units in the PM Current orders for Inpatient glycemic control:  Semglee 20 units bid Novolog sensitive tid with meals and HS  Inpatient Diabetes Program Recommendations:    Please consider adding Novolog meal coverage 3 units tid with meals (hold if patient eats less than 50% or NPO).   Thanks,  Adah Perl, RN, BC-ADM Inpatient Diabetes Coordinator Pager [phone]  (8a-5p)

## 2021-12-23 NOTE — Plan of Care (Signed)
  Problem: Activity: Goal: Ability to return to baseline activity level will improve Outcome: Progressing   Problem: Coping: Goal: Ability to adjust to condition or change in health will improve Outcome: Progressing   Problem: Metabolic: Goal: Ability to maintain appropriate glucose levels will improve Outcome: Progressing   Problem: Nutritional: Goal: Maintenance of adequate nutrition will improve Outcome: Progressing   Problem: Skin Integrity: Goal: Risk for impaired skin integrity will decrease Outcome: Progressing   Problem: Tissue Perfusion: Goal: Adequacy of tissue perfusion will improve Outcome: Progressing

## 2021-12-24 DIAGNOSIS — I96 Gangrene, not elsewhere classified: Secondary | ICD-10-CM | POA: Diagnosis not present

## 2021-12-24 DIAGNOSIS — K219 Gastro-esophageal reflux disease without esophagitis: Secondary | ICD-10-CM | POA: Diagnosis not present

## 2021-12-24 DIAGNOSIS — N179 Acute kidney failure, unspecified: Secondary | ICD-10-CM | POA: Diagnosis not present

## 2021-12-24 DIAGNOSIS — M86142 Other acute osteomyelitis, left hand: Secondary | ICD-10-CM | POA: Diagnosis not present

## 2021-12-24 LAB — CBC WITH DIFFERENTIAL/PLATELET
Abs Immature Granulocytes: 0.37 10*3/uL — ABNORMAL HIGH (ref 0.00–0.07)
Abs Immature Granulocytes: 0.39 10*3/uL — ABNORMAL HIGH (ref 0.00–0.07)
Basophils Absolute: 0.1 10*3/uL (ref 0.0–0.1)
Basophils Absolute: 0.1 10*3/uL (ref 0.0–0.1)
Basophils Relative: 1 %
Basophils Relative: 1 %
Eosinophils Absolute: 0.4 10*3/uL (ref 0.0–0.5)
Eosinophils Absolute: 0.4 10*3/uL (ref 0.0–0.5)
Eosinophils Relative: 4 %
Eosinophils Relative: 3 %
HCT: 35.1 % — ABNORMAL LOW (ref 39.0–52.0)
HCT: 36.2 % — ABNORMAL LOW (ref 39.0–52.0)
Hemoglobin: 11.3 g/dL — ABNORMAL LOW (ref 13.0–17.0)
Hemoglobin: 11.6 g/dL — ABNORMAL LOW (ref 13.0–17.0)
Immature Granulocytes: 3 %
Immature Granulocytes: 3 %
Lymphocytes Relative: 24 %
Lymphocytes Relative: 25 %
Lymphs Abs: 2.9 10*3/uL (ref 0.7–4.0)
Lymphs Abs: 3 10*3/uL (ref 0.7–4.0)
MCH: 25.1 pg — ABNORMAL LOW (ref 26.0–34.0)
MCH: 24.9 pg — ABNORMAL LOW (ref 26.0–34.0)
MCHC: 32.2 g/dL (ref 30.0–36.0)
MCHC: 32 g/dL (ref 30.0–36.0)
MCV: 77.8 fL — ABNORMAL LOW (ref 80.0–100.0)
MCV: 77.8 fL — ABNORMAL LOW (ref 80.0–100.0)
Monocytes Absolute: 0.8 10*3/uL (ref 0.1–1.0)
Monocytes Absolute: 0.9 10*3/uL (ref 0.1–1.0)
Monocytes Relative: 7 %
Monocytes Relative: 8 %
Neutro Abs: 7.1 10*3/uL (ref 1.7–7.7)
Neutro Abs: 7.5 10*3/uL (ref 1.7–7.7)
Neutrophils Relative %: 61 %
Neutrophils Relative %: 60 %
Platelets: 980 10*3/uL (ref 150–400)
Platelets: 817 10*3/uL — ABNORMAL HIGH (ref 150–400)
RBC: 4.51 MIL/uL (ref 4.22–5.81)
RBC: 4.65 MIL/uL (ref 4.22–5.81)
RDW: 15.8 % — ABNORMAL HIGH (ref 11.5–15.5)
RDW: 15.8 % — ABNORMAL HIGH (ref 11.5–15.5)
WBC: 11.7 10*3/uL — ABNORMAL HIGH (ref 4.0–10.5)
WBC: 12.4 10*3/uL — ABNORMAL HIGH (ref 4.0–10.5)
nRBC: 0 % (ref 0.0–0.2)
nRBC: 0 % (ref 0.0–0.2)

## 2021-12-24 LAB — BASIC METABOLIC PANEL
Anion gap: 10 (ref 5–15)
BUN: 13 mg/dL (ref 8–23)
CO2: 24 mmol/L (ref 22–32)
Calcium: 8.9 mg/dL (ref 8.9–10.3)
Chloride: 101 mmol/L (ref 98–111)
Creatinine, Ser: 0.68 mg/dL (ref 0.61–1.24)
GFR, Estimated: >60 mL/min (ref >60)
Glucose, Bld: 262 mg/dL — ABNORMAL HIGH (ref 70–99)
Potassium: 4.4 mmol/L (ref 3.5–5.1)
Sodium: 135 mmol/L (ref 135–145)

## 2021-12-24 LAB — TECHNOLOGIST SMEAR REVIEW
Plt Morphology: INCREASED
WBC MORPHOLOGY: NORMAL

## 2021-12-24 LAB — GLUCOSE, CAPILLARY
Glucose-Capillary: 202 mg/dL — ABNORMAL HIGH (ref 70–99)
Glucose-Capillary: 318 mg/dL — ABNORMAL HIGH (ref 70–99)

## 2021-12-24 MED ORDER — DOXYCYCLINE HYCLATE 100 MG PO TABS
100.0000 mg | ORAL_TABLET | Freq: Two times a day (BID) | ORAL | Status: DC
  Administered 2021-12-24: 100 mg via ORAL
  Filled 2021-12-24: qty 1

## 2021-12-24 MED ORDER — LACTATED RINGERS IV SOLN: INTRAVENOUS | Status: AC

## 2021-12-24 MED ORDER — DOXYCYCLINE HYCLATE 100 MG PO TABS: 100.0000 mg | ORAL_TABLET | Freq: Two times a day (BID) | ORAL | 0 refills | Status: AC

## 2021-12-24 MED ORDER — INSULIN GLARGINE-YFGN 100 UNIT/ML ~~LOC~~ SOLN: 20.0000 [IU] | Freq: Two times a day (BID) | SUBCUTANEOUS | 11 refills | Status: AC

## 2021-12-24 MED ORDER — INSULIN ASPART 100 UNIT/ML IJ SOLN: INTRAMUSCULAR | 11 refills | Status: AC

## 2021-12-24 MED ORDER — SENNOSIDES-DOCUSATE SODIUM 8.6-50 MG PO TABS
1.0000 | ORAL_TABLET | Freq: Every evening | ORAL | Status: AC | PRN
Start: 2021-12-24 — End: ?

## 2021-12-24 MED ORDER — INSULIN ASPART 100 UNIT/ML IJ SOLN
3.0000 [IU] | Freq: Three times a day (TID) | INTRAMUSCULAR | Status: DC
  Administered 2021-12-24: 3 [IU] via SUBCUTANEOUS

## 2021-12-24 MED ORDER — OXYCODONE HCL 15 MG PO TABS: 15.0000 mg | ORAL_TABLET | Freq: Four times a day (QID) | ORAL | 0 refills | Status: AC | PRN

## 2021-12-24 MED ORDER — ENSURE ENLIVE PO LIQD: 237.0000 mL | Freq: Two times a day (BID) | ORAL | 2 refills | Status: AC

## 2021-12-24 NOTE — Consult Note (Addendum)
[place] Nurse Consult follow-up note: Reason for Consult: NPWT dressing; left lateral foot Wound type: surgical; debridement 12/20/21; Dr. Posey Pronto Refer to previous progress notes for measurements Wound bed: 80% pale pink, 20% yellow fibrinous/slough proximal aspect of wound bed Drainage: mod amt pink drainage  in VAC canister; serosanguinous in the cannister Periwound: loose peeling skin surrounding skin.  Dark red-purple deep tissue pressure injuries; left anterior foot 6X1cm and left outer ankle 3X3cm with same appearance; previously noted on 6/19 Left great toe amputation with sutures intact  Dressing procedure/placement/frequency: Pt medicated prior to the procedure and tolerated with minimal amt discomfort.  Applied barrier ring to wound edges, then one piece black foam to 122m cont suction. Applied xeroform to left anterior foot/suture line/left outer foot, then applied ace wrap. W[facility]team will change dressing again on Fri if patient is still in the hospital at that time. DJulien GirtMSN, RN, [place]

## 2021-12-24 NOTE — Progress Notes (Signed)
TRH night cross cover note:   I was notified by RN of critical platelet level, noting that platelet count is 980.  This is relative to most recent prior platelet count of 805 when checked on 12/22/2021.    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  I placed order for peripherals near via technologist smear review, ordered a brief course of continuous IV fluids and also ordered repeat CBC to be checked around 8 AM.  Further the patient is on dual antiplatelet therapy as well as pharmacologic DVT prophylaxis.      Babs Bertin, DO Hospitalist

## 2021-12-24 NOTE — TOC Transition Note (Signed)
Transition of Care [facility]) - CM/SW Discharge Note   Patient Details  Name: [name] MRN: [id number] Date of Birth: 18-Nov-1955  Transition of Care [facility]) CM/SW Contact:  [name] Labrum, RN Phone Number: 12/24/2021, 11:11 AM   Clinical Narrative:    CM called and spoke with [name], CM with [facility] and I assisted the patient to speak with the business office at the facility and the patient paid his bill of 1,100.00 at the facility.  A firm bed offer was given to the patient.  Insurance authorization has been approved so that the patient can transfer to the facility.  I spoke with [name], CM at the facility and she is ordered the wound vac for the patient.  The patient is aware that he will likely transfer to the facility this afternoon after 2 pm once the wound vac has arrived and room is ready.  I notified bedside nursing and the attending, Dr. Karleen Hampshire and they are aware that the patient will transfer to the facility by [facility] after 2 pm today. Discharge orders and summary will be completed by the attending physician and sent to the facility.  PTAR was called and transportation was arranged for 2 pm today.  Bedside nursing to call nursing report to [facility] at [phone].  CM and MSW with DTP Team will continue to follow the patient for discharge to [facility] today.   Final next level of care: Skilled Nursing Facility Barriers to Discharge: Ship broker, Continued Medical Work up   Patient Goals and CMS Choice Patient states their goals for this hospitalization and ongoing recovery are:: SNF CMS Medicare.gov Compare Post Acute Care list provided to:: Patient Choice offered to / list presented to : Patient  Discharge Placement  Discharge to [place] facility today.                     Discharge Plan and Services In-house Referral: Clinical Social Work Discharge Planning Services: CM Consult Post Acute Care  Choice: [place]                               Social Determinants of Health (SDOH) Interventions     Readmission Risk Interventions    12/22/2021   10:38 AM  Readmission Risk Prevention Plan  Transportation Screening Complete  Medication Review (RN Care Manager) Complete  PCP or Specialist appointment within 3-5 days of discharge Complete  HRI or Home Care Consult Complete  SW Recovery Care/Counseling Consult Complete  Palliative Care Screening Not [place] Complete

## 2021-12-24 NOTE — Discharge Summary (Addendum)
Physician Discharge Summary   Patient: [name]: [id number] DOB: 06-06-1956  Admit date:     12/14/2021  Discharge date: 12/24/21  Discharge Physician: Hosie Poisson   PCP: Harvie Junior, MD   Recommendations at discharge:  Please follow up with PCP in one week Please follow up with orthopedics as recommended.   Discharge Diagnoses: Principal Problem:   Gangrene of left foot ([place]) Active Problems:   Acute osteomyelitis of phalanx of foot, left (HCC)   Acute osteomyelitis of metacarpal bone, left (HCC)   Diabetic foot ulcer with osteomyelitis (HCC)   Severe sepsis (HCC)   Tenosynovitis of left foot   PAD (peripheral artery disease) (HCC)   AKI (acute kidney injury) ([place])   Type 2 diabetes mellitus ([place])   Hypertension associated with diabetes ([place])   Depression   GERD without esophagitis   Hyperlipidemia associated with type 2 diabetes mellitus (HCC)   S/P transmetatarsal amputation of foot, right (HCC)   Normocytic anemia   Bandemia   Thrombocytosis   Hyponatremia   Hypokalemia   Hypophosphatemia    Hospital Course:   66 year old M with PMH of IDDM-2 with neuropathy and other complications, PVD s/p right below-knee popliteal artery to PTA bypass and right TMT amputation on 09/19/2021 and s/p left PTA PCI and stent to left SFA on 12/08/2021, COPD, OSA not on CPAP, HTN, HLD, GERD and depression presented with new pain and drainage from left small toe, subjective fever and decreased appetite.  He was on p.o. Keflex after recent hospitalization, which was discontinued by ID 4 days prior to presentation.  He was admitted with gangrenous left foot infection and PAD.  He was started on IV vancomycin, CTX and Flagyl.  Vascular surgery and podiatry consulted.     X-ray with subcutaneous soft tissue gas about left fifth metatarsal and metatarsophalangeal joint concerning for gas-forming infection.  MRI of left foot without contrast shows soft tissue ulceration and  findings suggestive for acute osteomyelitis of fifth metatarsal head, fifth metatarsal shaft, distal phalanx of left great toe and mild extensor digitorum brevis tenosynovitis.   LE Doppler on 6/12 concerning for some stenosis in SFA, DFA and PT with monophasic flow throughout.  ABI with moderate right and mild left PAD improved from prior ABI.  Had aortogram on 6/14 that showed maximal revascularization without intervention.     Patient underwent left big toe amputation and left 5th ray partial amputation on 6/15, and left lateral foot excisional debridement with washout and wound VAC application on 9/83.  Surgical culture with ESBL E. coli. Infectious disease consulted.  He will be NWB on left foot.    Assessment and Plan:   Severe sepsis present on admission with tachycardia leukocytosis, lactic acidosis, fever and AKI Acute osteomyelitis of the left foot, diabetic foot ulcer in the setting of uncontrolled diabetes mellitus. Left great toe gangrenous changes in the setting of bilateral peripheral artery disease Blood cultures negative till date. Aortogram with maximally revascularized left lower extremity without intervention on June 14. Patient underwent left big toe amputation and left fifth ray partial amputation by Dr. Posey Pronto on 12/18/2021. He also underwent left lateral foot excisional debridement with washout and wound VAC application on 3/82/5053.  Patient was started on broad-spectrum IV antibiotics completed 7 days of vancomycin rocephin and flagyl followed by 3 days of Meropenam, ID on board and appreciate recommendations. OR cultures growing rare E coli and MRSA.  He will be discharged on 7 days of doxycycline to complete  the course.    Podiatry recommends MWF wound VAC changes, and outpatient follow up in one week from discharge.  Nonweightbearing on the left foot. Continue with aspirin Plavix and Lipitor. Therapy evaluations recommending SNF on discharge.   Uncontrolled type 2  diabetes mellitus insulin-dependent with hyperglycemia.  -currently on 20 units of Lantus BID, SSI, and Jardiance.  -Continue Lipitor 80 mg daily -Increased gabapentin as above.    AKI Resolved     Anemia of chronic disease/normocytic anemia Hemoglobin stable around 10 .        Hypertension Blood pressure parameters are optimal., continue with amlodipine and Lopressor.  Hyperlipidemia Continue with statin.     Thrombocytosis probably secondary to sepsis Continue to monitor, reactive. Recommend outpatient follow up with CBC in one week      Depression/mood disorder Continue with home medications      GERD stable continue with PPI.    Leukocytosis:  Improving.          Malnutrition Type:   Nutrition Problem: Increased nutrient needs Etiology: wound healing     Malnutrition Characteristics:   Signs/Symptoms: estimated needs     Nutrition Interventions:   Interventions: Ensure Enlive (each supplement provides 350kcal and 20 grams of protein), Prostat   Estimated body mass index is 26.91 kg/m as calculated from the following:   Height as of this encounter: 6' (1.829 m).   Weight as of this encounter: 90 kg.        Consultants: orthopedics Procedures performed:  left big toe amputation and left fifth ray partial amputation by Dr. Posey Pronto on 12/18/2021.  Disposition: Skilled nursing facility Diet recommendation:  Discharge Diet Orders (From admission, onward)     Start     Ordered   12/24/21 0000  Diet - low sodium heart healthy        12/24/21 1044           Cardiac diet DISCHARGE MEDICATION: Allergies as of 12/24/2021       Reactions   Gemfibrozil Other (See Comments)   Doesn't know its been awhile   Oxycontin [oxycodone Hcl] Other (See Comments)   Bad reaction "took me to a place I never wanna go again"   Pork-derived Products    Simvastatin Other (See Comments)   Says he doesn't know its been awhile        Medication List      STOP taking these medications    insulin aspart protamine- aspart (70-30) 100 UNIT/ML injection Commonly known as: NOVOLOG MIX 70/30   MILK OF MAGNESIA PO       TAKE these medications    albuterol 108 (90 Base) MCG/ACT inhaler Commonly known as: VENTOLIN HFA Inhale 2 puffs into the lungs every 6 (six) hours as needed for wheezing or shortness of breath.   amLODipine 5 MG tablet Commonly known as: NORVASC Take 1 tablet (5 mg total) by mouth every morning.   aspirin EC 81 MG tablet Take 1 tablet (81 mg total) by mouth daily. Swallow whole.   atorvastatin 80 MG tablet Commonly known as: LIPITOR Take 1 tablet (80 mg total) by mouth at bedtime.   buPROPion 300 MG 24 hr tablet Commonly known as: WELLBUTRIN XL Take 1 tablet (300 mg total) by mouth every morning.   cholecalciferol 25 MCG (1000 UNIT) tablet Commonly known as: VITAMIN D Take 1,000 Units by mouth daily.   clopidogrel 75 MG tablet Commonly known as: Plavix Take 1 tablet (75 mg total) by mouth daily.  doxycycline 100 MG tablet Commonly known as: VIBRA-TABS Take 1 tablet (100 mg total) by mouth every 12 (twelve) hours for 7 days.   empagliflozin 25 MG Tabs tablet Commonly known as: JARDIANCE Take 1 tablet (25 mg total) by mouth daily.   feeding supplement Liqd Take 237 mLs by mouth 2 (two) times daily between meals.   fluticasone furoate-vilanterol 100-25 MCG/INH Aepb Commonly known as: Breo Ellipta Inhale 1 puff into the lungs daily.   gabapentin 600 MG tablet Commonly known as: NEURONTIN Take 600 mg by mouth 2 (two) times daily. What changed: Another medication with the same name was removed. Continue taking this medication, and follow the directions you see here.   insulin aspart 100 UNIT/ML injection Commonly known as: novoLOG CBG 70 - 120: 0 units CBG 121 - 150: 1 unit CBG 151 - 200: 2 units CBG 201 - 250: 3 units CBG 251 - 300: 5 units CBG 301 - 350: 7 units CBG 351 - 400: 9 units   insulin  glargine-yfgn 100 UNIT/ML injection Commonly known as: SEMGLEE Inject 0.2 mLs (20 Units total) into the skin 2 (two) times daily.   lisinopril 20 MG tablet Commonly known as: ZESTRIL Take 20 mg by mouth daily.   lurasidone 40 MG Tabs tablet Commonly known as: LATUDA Take 1 tablet (40 mg total) by mouth daily with supper.   meclizine 25 MG tablet Commonly known as: ANTIVERT Take 1 tablet (25 mg total) by mouth 3 (three) times daily as needed for dizziness.   metoprolol tartrate 50 MG tablet Commonly known as: LOPRESSOR Take 1 tablet (50 mg total) by mouth 2 (two) times daily.   OLANZapine 2.5 MG tablet Commonly known as: ZYPREXA Take 1 tablet (2.5 mg total) by mouth at bedtime.   omeprazole 20 MG capsule Commonly known as: PRILOSEC Take 1 capsule (20 mg total) by mouth at bedtime.   OXcarbazepine 150 MG tablet Commonly known as: TRILEPTAL Take 1 tablet (150 mg total) by mouth 2 (two) times daily.   oxyCODONE 15 MG immediate release tablet Commonly known as: ROXICODONE Take 1 tablet (15 mg total) by mouth every 6 (six) hours as needed for up to 3 days for pain. What changed:  when to take this Another medication with the same name was removed. Continue taking this medication, and follow the directions you see here.   senna-docusate 8.6-50 MG tablet Commonly known as: Senokot-S Take 1 tablet by mouth at bedtime as needed for mild constipation.               Durable Medical Equipment  (From admission, onward)           Start     Ordered   12/22/21 0822  For home use only DME Negative pressure wound device  Once       Comments: Medium foam  Question Answer Comment  Frequency of dressing change 3 times per week   Length of need 3 Months   Dressing type Foam   Amount of suction 125 mm/Hg   Pressure application Continuous pressure   Supplies 10 canisters and 15 dressings per month for duration of therapy      12/22/21 9924              Discharge  Care Instructions  (From admission, onward)           Start     Ordered   12/24/21 0000  Discharge wound care:       Comments: Negative  pressure wound therapy  Every Mon-Wed-Fri 1000    Comments: WOC will change Vac dressing Q M/W/F.  Apply xeroform to previous toe amputation site and anterior and outer foot Deep tissue pressure injury.   12/24/21 1044            Follow-up Information     Harvie Junior, MD Follow up.   Specialty: Family Medicine Contact information: [address] [address] [phone]                Discharge Exam: [name] Weights   12/19/21 0424 12/20/21 0702 12/22/21 0500  Weight: 95.1 kg 95.1 kg 90 kg   General exam: Appears calm and comfortable  Respiratory system: Clear to auscultation. Respiratory effort normal. Cardiovascular system: S1 & S2 heard, RRR. No JVD, No pedal edema. Gastrointestinal system: Abdomen is nondistended, soft and non tender Normal bowel sounds heard. Central nervous system: Alert and oriented. No focal neurological deficits. Extremities: wound vac on the left foot. Right foot bandaged.  Skin: No rashes,  Psychiatry: Judgement and insight appear normal. Mood & affect appropriate.    Condition at discharge: fair  The results of significant diagnostics from this hospitalization (including imaging, microbiology, ancillary and laboratory) are listed below for reference.   Imaging Studies: DG Foot Complete Left  Result Date: 12/19/2021 CLINICAL DATA:  Status post foot surgery. EXAM: LEFT FOOT - COMPLETE 3+ VIEW COMPARISON:  Left foot radiographs 12/14/2021 and MRI left foot 12/15/2021 i FINDINGS: Interval amputation of fifth digit to the level of the proximal metatarsal shaft. Interval amputation of the great toe to the level of the proximal metaphysis of the proximal phalanx. Likely wound VAC material overlying the distal lateral amputation site. The postsurgical bone margins are sharp. No acute  fracture or dislocation. IMPRESSION: Interval amputation of the sites of osteomyelitis seen on prior MRI including the fifth ray to the level of the proximal metatarsal shaft and the great toe to the level of the proximal metaphysis of the proximal phalanx. Electronically Signed   By: [name] M.D.   On: 12/19/2021 09:09   PERIPHERAL VASCULAR CATHETERIZATION  Result Date: 12/18/2021 Images from the original result were not included. Patient name: [name] [name]: [id number] DOB: 1955-09-09 Sex: male 12/17/2021 Pre-operative Diagnosis: left leg [place]uther[place] 5 critical limb ischemia Post-operative diagnosis:  Same Surgeon:  [name], MD Procedure Performed: 1.  Ultrasound-guided micropuncture access of the right common femoral artery in retrograde fashion 2.  Second order cannulation, left low extremity angiogram. 3.  Device assisted closure, mynx Indications: Patient is a 66 year old male with multiple interventions on bilateral lower extremities, most recently left lower extremity complex revascularization from both antegrade and retrograde approaches by my partner Dr. Donzetta Matters.  This resulted in superficial femoral artery stenting, single-vessel posterior tibial outflow to the foot.  He was recently admitted to the hospital with new onset foot infection.  Duplex ultrasonography demonstrated concern for high-grade stenosis in the superficial femoral artery.  After discussing the risk and benefits of diagnostic angiogram with possible intervention for assisted primary stent patency, Khalee elected to proceed. Findings: Left common femoral artery patent, profunda patent, interval stents appreciated in the superficial femoral artery extending a small amount into the common femoral artery.  This does not jail the profunda.  Distally, the superficial femoral artery is lined with self-expanding stents, no flow-limiting stenosis.  The popliteal artery is without flow-limiting stenosis.  Outflow is brisk  single-vessel posterior tibial runoff to the foot.  There is an area of stenosis less than 50% of the mid posterior tibial artery.  The medial and lateral plantar arteries fill in the foot, no filling of the dorsalis pedis.  The pedal arch is not intact.  Procedure:  The patient was identified in the holding area and taken to room 8.  The patient was then placed supine on the table and prepped and draped in the usual sterile fashion.  A time out was called.  Ultrasound was used to evaluate the right common femoral artery.  It was patent .  A digital ultrasound image was acquired.  A micropuncture needle was used to access the right common femoral artery under ultrasound guidance.  An 018 wire was advanced without resistance and a micropuncture sheath was placed.  The 018 wire was removed and a benson wire was placed.  The micropuncture sheath was exchanged for a 5 french sheath.  An omniflush catheter was advanced over the aortic bifurcation into the left external iliac artery.  Left lower extremity runoff was obtained. Impression: Patient maximally revascularized.  No intervention taken. Cassandria Santee, MD [facility] Office: [phone]   VAS [place] ABI WITH/WO TBI  Result Date: 12/16/2021  LOWER EXTREMITY DOPPLER STUDY Patient Name:  [name]  Date of Exam:   12/16/2021 Medical Rec #: [id number]          Accession #:    [id number] Date of Birth: [birth year]           Patient Gender: M Patient Age:   65 years Exam Location:  [facility] Procedure:      VAS [place] ABI WITH/WO TBI Referring Phys: JOSHUA ROBINS --------------------------------------------------------------------------------  Indications: Ulceration, gangrene, and peripheral artery disease. High Risk         Hypertension, hyperlipidemia, Diabetes, past history of Factors:          smoking.  Comparison Study: 11/14/21 ABI/TBI- right=0.7/amp, left=0.3/gangrene Performing Technologist: Maudry Mayhew MHA, RVT,  RDCS, RDMS  Examination Guidelines: A complete evaluation includes at minimum, Doppler waveform signals and systolic blood pressure reading at the level of bilateral brachial, anterior tibial, and posterior tibial arteries, when vessel segments are accessible. Bilateral testing is considered an integral part of a complete examination. Photoelectric Plethysmograph (PPG) waveforms and toe systolic pressure readings are included as required and additional duplex testing as needed. Limited examinations for reoccurring indications may be performed as noted.  ABI Findings: +---------+------------------+-----+----------+----------+ Right    Rt Pressure (mmHg)IndexWaveform  Comment    +---------+------------------+-----+----------+----------+ Brachial 148                    triphasic            +---------+------------------+-----+----------+----------+ PTA      117               0.79 monophasic           +---------+------------------+-----+----------+----------+ DP       90                0.61 monophasic           +---------+------------------+-----+----------+----------+ Great Toe                                 amputation +---------+------------------+-----+----------+----------+ +---------+------------------+-----+----------+--------+ Left     Lt Pressure (mmHg)IndexWaveform  Comment  +---------+------------------+-----+----------+--------+ Brachial 140  triphasic          +---------+------------------+-----+----------+--------+ PTA      118               0.80 monophasic         +---------+------------------+-----+----------+--------+ DP       88                0.59 monophasic         +---------+------------------+-----+----------+--------+ Great Toe                                 gangrene +---------+------------------+-----+----------+--------+ +-------+-----------+-----------+------------+------------+ ABI/TBIToday's ABIToday's  TBIPrevious ABIPrevious TBI +-------+-----------+-----------+------------+------------+ Right  0.79       0.00       0.7         amp          +-------+-----------+-----------+------------+------------+ Left   0.80       0.00       0.3         gangrene     +-------+-----------+-----------+------------+------------+  Bilateral ABIs appear increased compared to prior study on 11/14/2021.  Summary: Right: Resting right ankle-brachial index indicates moderate right lower extremity arterial disease. Left: Resting left ankle-brachial index indicates mild left lower extremity arterial disease. *See table(s) above for measurements and observations.  Electronically signed by Deitra Mayo MD on 12/16/2021 at 3:07:48 PM.    Final    VAS [place] LOWER EXTREMITY ARTERIAL DUPLEX  Result Date: 12/15/2021 LOWER EXTREMITY ARTERIAL DUPLEX STUDY Patient Name:  [name]  Date of Exam:   12/15/2021 Medical Rec #: [id number]          Accession #:    [id number] Date of Birth: [birth year]           Patient Gender: M Patient Age:   12 years Exam Location:  [facility] Procedure:      VAS [place] LOWER EXTREMITY ARTERIAL DUPLEX Referring Phys: Servando Snare --------------------------------------------------------------------------------  Indications: Gangrene, and peripheral artery disease. High Risk Factors: Hypertension, Diabetes.  Vascular Interventions: 12/08/2021-                         1. Ultrasound-guided cannulation right common femoral                         artery                         2. Selection of left common femoral artery and left                         lower extremity angiography                         3. Ultrasound-guided cannulation of the left posterior                         tibial artery                         4. Laser athrectomy with 1.17m [place] left posterior                         tibial artery and 3 mm balloon angioplasty  5. Stent of left SFA with 6 x 150, 6 x  150 and 6 x 80 mm                         [place] postdilated with 5 mm balloon. Current ABI:            Not obtained Comparison Study: No prior study Performing Technologist: Maudry Mayhew MHA, RDMS, RVT, RDCS  Examination Guidelines: A complete evaluation includes B-mode imaging, spectral Doppler, color Doppler, and power Doppler as needed of all accessible portions of each vessel. Bilateral testing is considered an integral part of a complete examination. Limited examinations for reoccurring indications may be performed as noted.   +----------+--------+-----+---------------+----------+--------+ LEFT      PSV cm/sRatioStenosis       Waveform  Comments +----------+--------+-----+---------------+----------+--------+ CFA Distal142                         monophasic         +----------+--------+-----+---------------+----------+--------+ DFA       461          75-99% stenosismonophasic         +----------+--------+-----+---------------+----------+--------+ SFA Prox  595          75-99% stenosismonophasicInflow   +----------+--------+-----+---------------+----------+--------+ SFA Distal241                         monophasic         +----------+--------+-----+---------------+----------+--------+ POP Prox  132                         monophasic         +----------+--------+-----+---------------+----------+--------+ POP Distal111                         monophasic         +----------+--------+-----+---------------+----------+--------+ TP Trunk  105                         monophasic         +----------+--------+-----+---------------+----------+--------+ PTA Prox  148                         monophasic         +----------+--------+-----+---------------+----------+--------+ PTA Mid   229          50-99% stenosismonophasic         +----------+--------+-----+---------------+----------+--------+ PTA Distal75                          monophasic          +----------+--------+-----+---------------+----------+--------+  Left Stent(s): +---------------+--------+--------+----------+--------+ SFA            PSV cm/sStenosisWaveform  Comments +---------------+--------+--------+----------+--------+ Prox to Stent  595             monophasic         +---------------+--------+--------+----------+--------+ Proximal Stent 133             monophasic         +---------------+--------+--------+----------+--------+ Mid Stent      111             monophasic         +---------------+--------+--------+----------+--------+ Distal Stent   115             monophasic         +---------------+--------+--------+----------+--------+  Distal to Stent116             monophasic         +---------------+--------+--------+----------+--------+  Summary: Left: 75-99% stenosis noted at the deep femoral artery origin. 75-99% stenosis noted in the superficial femoral artery, inflow to the SFA stent. Unable to definitively visualize the posterior tibial artery stent, however there is 50-99% stenosis (by stent criteria) noted in the mid segment of the posterior tibial artery.  See table(s) above for measurements and observations. Electronically signed by Servando Snare MD on 12/15/2021 at 6:01:53 PM.    Final    MR FOOT LEFT WO CONTRAST  Result Date: 12/15/2021 CLINICAL DATA:  History of type 2 diabetes follow-up for left great toe gangrene. Previously on antibiotics which were discontinued 4 days ago. Since then has noticed new wound on lateral left foot draining bloody discharge. Left foot infection. Evaluate for osteomyelitis. EXAM: MRI OF THE LEFT FOOT WITHOUT CONTRAST TECHNIQUE: Multiplanar, multisequence MR imaging of the left forefoot was performed. No intravenous contrast was administered. The technologist reports the patient's leg was jerking and his foot became too painful to complete the exam. Axial T2 fat saturation and axial T1 images and coronal T2  fat saturation and coronal T1 images as well as sagittal STIR images were performed. COMPARISON:  Left foot radiographs 12/14/2021 FINDINGS: Despite efforts by the technologist and patient, motion artifact is present on today's exam and could not be eliminated. This reduces exam sensitivity and specificity. Bones/Joint/Cartilage There is soft tissue ulceration within the plantar lateral aspect of the forefoot to the level of the fifth metatarsal head contacting the plantar lateral aspect of the metatarsal head. Moderate subcutaneous fat edema in this region. Mild erosion of the plantar lateral aspect of the fifth metatarsal head (axial series 4, image 19) with marrow edema throughout the fifth metatarsal head and majority of the fifth metatarsal shaft concerning for acute osteomyelitis. There is scattered low signal air corresponding to the subcutaneous air seen lateral to the fifth metatarsophalangeal joint and lateral to the fifth metatarsal shaft on prior day radiographs. There is focal ulceration within the distal tip of the great toe soft tissues contacting the distal aspect of the distal metatarsal where there is mild cortical erosion (sagittal series 8 images 7 through 9). There is diffuse decreased T1 increased T2 bone marrow edema throughout the fifth metatarsal concerning for osteomyelitis. Ligaments The Lisfranc ligament complex appears intact. The plantar plates appear intact. Muscles and Tendons Moderate atrophy of the extensor digitorum brevis muscle. Mild fatty infiltration of the abductor hallucis muscle. Moderate to high-grade edema throughout the plantar and dorsal midfoot musculature. Mild fluid within the extensor digitorum brevis tendon sheaths (axial series 5, images 1-9), tenosynovitis. Soft tissues Moderate dorsal midfoot and forefoot subcutaneous fat edema and swelling. IMPRESSION: 1. Soft tissue ulceration plantar lateral to the fifth metatarsal head contacting the bone where there is mild  cortical erosion. Marrow edema throughout the fifth metatarsal head and fifth metatarsal shaft indicating fifth metatarsal head acute osteomyelitis possibly extending proximally into the metatarsal shaft. 2. Focal soft tissue ulceration within the distal tip of the great toe with mild cortical erosion within the distal aspect of the distal phalanx and diffuse marrow edema throughout the distal phalanx indicating acute osteomyelitis. 3. Moderate plantar and dorsal midfoot nonspecific myositis. This may be bland or infected. 4. Mild extensor digitorum brevis tenosynovitis. Electronically Signed   By: [name] M.D.   On: 12/15/2021 08:21   DG Foot Complete Left  Result Date: 12/14/2021 CLINICAL DATA:  Diabetic foot wound, possible gangrene of great toe EXAM: LEFT FOOT - COMPLETE 3+ VIEW COMPARISON:  None Available. FINDINGS: No fracture or dislocation of the left foot. No bony erosion or sclerosis. Subcutaneous soft tissue gas about the left fifth metatarsal and metatarsophalangeal joint, and possibly additionally about the tip of the great toe. Diffuse soft tissue edema about the forefoot. IMPRESSION: 1. Subcutaneous soft tissue gas about the left fifth metatarsal and metatarsophalangeal joint, and possibly additionally about the tip of the great toe. Findings are concerning for gas-forming infection. 2. No bony erosion or sclerosis to suggest osteomyelitis. MRI is the most sensitive test to detect bone marrow edema and osteomyelitis if clinically suspected. 3.  No fracture or dislocation of the left foot. These results were called by telephone at the time of interpretation on 12/14/2021 at 4:32 pm to [facility], [place], who verbally acknowledged these results. Electronically Signed   By: Delanna Ahmadi M.D.   On: 12/14/2021 16:51   PERIPHERAL VASCULAR CATHETERIZATION  Result Date: 12/08/2021 Images from the original result were not included. 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 Donzetta Matters, MD Procedure Performed: 1.  Ultrasound-guided cannulation right common femoral artery 2.  Selection of left common femoral artery and left lower extremity angiography 3.  Ultrasound-guided cannulation of the left posterior tibial artery 4.  Laser athrectomy with 1.21m Auryon left posterior tibial artery and 3 mm balloon angioplasty 5.  Stent of left SFA with 6 x 150, 6 x 150 and 6 x 80 mm Elluvia postdilated with 5 mm balloon 6.  Mynx device closure right common femoral artery 7.  Moderate sedation with fentanyl and Versed for 90 minutes Indications: 66year old male with previous right lower extremity bypass recently underwent balloon angioplasty of a retained valve.  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  He is indicated for angiography. Wi-Fi = 6 Findings: The left common femoral artery was healthy with profunda very large and the dominant flow in the thigh.  The SFA was very diminutive in takeoff and then chronically occluded for a long segment and after stenting this was reduced to 0% stenosis.  Posterior tibial artery is the only runoff to the foot and after laser arthrectomy and balloon angioplasty where this was occluded now was reduced to 0% stenosis.  At completion the sheath in the left posterior tibial artery had a mean arterial pressure of 85 mmHg.  Procedure:  The patient was identified in the holding area and taken to room 8.  The patient was then placed supine on the table and prepped and draped in the usual sterile fashion.  A time out was called.  Ultrasound was used to evaluate the right common femoral artery which was patent and compressible.  There is no spasm percent lidocaine cannulated by functional followed by wire and the sheath.  Images saved  the permanent record.  Concomitantly fentanyl and Versed was administered and his vital signs were monitored throughout the case by nursing.  A Bentson wire was placed followed by a 5 FPakistansheath and an Omni catheter was used to cross the bifurcation with a Glidewire advantage placed the Omni catheter in the left common femoral artery for left lower extremity angiography.  We then exchanged over  Glidewire advantage for a long 6 [place] sheath patient was fully heparinized.  I then crossed the occluded SFA down to the level of the popliteal artery perform angiography and this demonstrated long segment occlusion of the posterior tibial artery.  Given the long segment difficulty to cross from above I elected to prep out the left foot.  Ultrasound was used to then identify the left posterior tibial artery which is very large at the ankle.  This area was anesthetized cannulated with micropuncture needle followed by wire and a 4 French micro sheath.  We confirmed intraluminal access.  We then used a V18 and CXI catheter to cross retrograde and this did cross very easily into the SFA and we then snared a [place] core 014 wire and pulled through and through access.  We began with laser arthrectomy posterior tibial artery followed by plain balloon angioplasty and completion demonstrated great flow with no residual stenosis no dissection.  We then primarily stented the left SFA with 3 drug-eluting stents and postdilated with 5 mm balloons.  Completion demonstrated no residual dissection or stenosis.  Pressure at the ankle now measured 85 mmHg is a mean arterial pressure.  We then remove the wire.  Sheath was pulled in the ankle and pressure held.  We performed retrograde angiography from the right groin elected to exchanged for short 6 French sheath and deployed a minx device which deployed well.  He did tolerate procedure well without immediate complications Contrast: [id number] Brandon C. Donzetta Matters, MD [facility] [place] Office: [phone] Pager: [phone]   CARDIAC CATHETERIZATION  Result Date: 12/08/2021 Images from the original result were not included. 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 Donzetta Matters, MD Procedure Performed: 1.  Ultrasound-guided cannulation right common femoral artery 2.  Selection of left common femoral artery and left lower extremity angiography 3.  Ultrasound-guided cannulation of the left posterior tibial artery 4.  Laser athrectomy with 1.46m Auryon left posterior tibial artery and 3 mm balloon angioplasty 5.  Stent of left SFA with 6 x 150, 6 x 150 and 6 x 80 mm Elluvia postdilated with 5 mm balloon 6.  Mynx device closure right common femoral artery 7.  Moderate sedation with fentanyl and Versed for 90 minutes Indications: 66year old male with previous right lower extremity bypass recently underwent balloon angioplasty of a retained valve.  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  He is indicated for angiography. Wi-Fi = 6 Findings: The left common femoral artery was healthy with profunda very large and the dominant flow in the thigh.  The SFA was very diminutive in takeoff and then chronically occluded for a long segment and after stenting this was reduced to 0% stenosis.  Posterior tibial artery is the only runoff to the foot and after laser arthrectomy and balloon angioplasty where this was occluded now was reduced to 0% stenosis.  At completion the sheath in the left posterior tibial artery had a mean arterial pressure of 85 mmHg.  Procedure:  The patient was identified in the holding area and taken to room 8.  The patient was then placed supine on the table and prepped and draped in the usual sterile fashion.  A time  out was called.  Ultrasound was used to evaluate the right  common femoral artery which was patent and compressible.  There is no spasm percent lidocaine cannulated by functional followed by wire and the sheath.  Images saved the permanent record.  Concomitantly fentanyl and Versed was administered and his vital signs were monitored throughout the case by nursing.  A Bentson wire was placed followed by a 5 [place] sheath and an Omni catheter was used to cross the bifurcation with a Glidewire advantage placed the Omni catheter in the left common femoral artery for left lower extremity angiography.  We then exchanged over Glidewire advantage for a long 6 [place] sheath patient was fully heparinized.  I then crossed the occluded SFA down to the level of the popliteal artery perform angiography and this demonstrated long segment occlusion of the posterior tibial artery.  Given the long segment difficulty to cross from above I elected to prep out the left foot.  Ultrasound was used to then identify the left posterior tibial artery which is very large at the ankle.  This area was anesthetized cannulated with micropuncture needle followed by wire and a 4 French micro sheath.  We confirmed intraluminal access.  We then used a V18 and CXI catheter to cross retrograde and this did cross very easily into the SFA and we then snared a [place] core 014 wire and pulled through and through access.  We began with laser arthrectomy posterior tibial artery followed by plain balloon angioplasty and completion demonstrated great flow with no residual stenosis no dissection.  We then primarily stented the left SFA with 3 drug-eluting stents and postdilated with 5 mm balloons.  Completion demonstrated no residual dissection or stenosis.  Pressure at the ankle now measured 85 mmHg is a mean arterial pressure.  We then remove the wire.  Sheath was pulled in the ankle and pressure held.  We performed retrograde angiography from the right groin  elected to exchanged for short 6 French sheath and deployed a minx device which deployed well.  He did tolerate procedure well without immediate complications Contrast: [id number] Brandon C. Donzetta Matters, MD [facility] [place] Office: [phone] Pager: [phone]   PERIPHERAL VASCULAR CATHETERIZATION  Result Date: 12/04/2021 Images from the original result were not included.   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  Ultrasound-guided micropuncture access of the left common femoral artery 2.  Aortogram 3.  Second-order cannulation, right lower extremity angiogram 4.  Balloon angioplasty using a chocolate balloon 3 x 40m of retained valve and previous bypass 5.  Left lower extremity angiogram 6.  Moderate sedation time 59 minutes 7.  Contrast volume 150 mL  Indications: Patient is a 66year old male who is well-known to my practice having previously undergone right-sided popliteal to posterior tibial artery bypass using ipsilateral, nonreversed greater saphenous vein.  Ultrasound studies postoperatively demonstrated high velocities focally within the bypass concerning for retained valve.  At the same visit, [place] found to have left-sided Rutherford 5 critical limb ischemia.  After discussing the risk and benefits of bilateral lower extremity angiogram-right-sided 4 assisted bypass patency, left-sided for Rutherford 5 critical limb ischemia, and is elected to proceed.  Findings: Aortogram:  Bilateral renal arteries patent, no flow-limiting stenosis appreciated in the aortoiliac segment  On the right: Widely patent common femoral artery, profunda, mild disease throughout the superficial  femoral artery, no flow-limiting stenosis.  No flow-limiting stenosis in the popliteal artery.  Proximal anastomosis of the popliteal to posterior tibial artery bypass widely patent.  Small area of focal stenosis greater than 95% appreciated in the mid bypass.  No stenosis appreciated distally.  Excellent posterior tibial runoff into the foot.  On the left: Widely patent common femoral artery, profunda.  Two tandem, tapering stenoses appreciated in the superficial femoral artery with occlusion.  Popliteal artery without flow-limiting disease.  No named vessels in the calf with reconstitution at the distal posterior tibial artery roughly 15 cm from the malleolus.  This is sizable, and continues into the foot             Procedure:  The patient was identified in the holding area and taken to room 8.  The patient was then placed supine on the table and prepped and draped in the usual sterile fashion.  A time out was called.  Ultrasound was used to evaluate the left common femoral artery.  It was patent .  A digital ultrasound image was acquired.  A micropuncture needle was used to access the left common femoral artery under ultrasound guidance.  An 018 wire was advanced without resistance and a micropuncture sheath was placed.  The 018 wire was removed and a benson wire was placed.  The micropuncture sheath was exchanged for a 5 french sheath.  An omniflush catheter was advanced over the wire to the level of L-1.  An abdominal angiogram was obtained.  Next, using the omniflush catheter and a benson wire, the aortic bifurcation was crossed and the catheter was placed into theright external iliac artery and right runoff was obtained.  left runoff was performed via retrograde sheath injections.  I elected to intervene on the retained bypass valve.  A 6 x 65 cm sheath was brought onto the field and parked in the right superficial femoral artery from this location a series of wires and catheters were used to select the  bypass and cross the retained valve.  Next, a 3 x 40 mm chocolate balloon was brought onto the field and expanded across the lesion.  Several inflations followed.  The balloon was expanded to profile and flossed across the valve.  Follow-up angiography demonstrated an excellent result with resolution of the stenosis.   Impression: Successful balloon angioplasty and valve lysis using a 3 x 40 mm chocolate balloon. In the left leg, patient has Rutherford 5 critical limb ischemia with multilevel occlusive disease.  He would benefit from a hybrid repair as he has a small amount of usable vein in the calf.  My plan is for SFA stenting with popliteal to posterior tibial artery bypass using ipsilateral GSV.    Cassandria Santee, MD [facility] [place] Office: [phone]    VAS [place] LOWER EXTREMITY SAPHENOUS VEIN MAPPING  Result Date: 12/03/2021 LOWER EXTREMITY VEIN MAPPING Patient Name:  [name]  Date of Exam:   12/03/2021 Medical Rec #: [id number]          Accession #:    [id number] Date of Birth: 1955-12-25           Patient Gender: M Patient Age:   29 years Exam Location:  [facility] Procedure:      VAS [place] LOWER EXTREMITY SAPHENOUS VEIN  MAPPING Referring Phys: Vonna Kotyk ROBINS --------------------------------------------------------------------------------  Indications: Pre-op  Comparison Study: 09/14/2021 lower extremity vein mapping Performing Technologist: Maudry Mayhew MHA, RDMS, RVT, RDCS  Examination Guidelines: A complete evaluation includes B-mode imaging, spectral Doppler, color Doppler, and power Doppler as needed of all accessible portions of each vessel. Bilateral testing is considered an integral part of a complete examination. Limited examinations for reoccurring indications may be performed as noted. +------------+--------------+----------------------+------------+--------------+ RT Diameter  RT Findings           GSV          LT Diameter  LT Findings        (cm)                                            (cm)                   +------------+--------------+----------------------+------------+--------------+     0.34                      Saphenofemoral        0.49      branching                                     Junction                                  +------------+--------------+----------------------+------------+--------------+     0.24                      Proximal thigh        0.15                   +------------+--------------+----------------------+------------+--------------+     0.08                        Mid thigh                   not visualized +------------+--------------+----------------------+------------+--------------+     0.17                       Distal thigh         0.05                   +------------+--------------+----------------------+------------+--------------+     0.23      branching            Knee             0.14                   +------------+--------------+----------------------+------------+--------------+     0.27      branching         Prox calf           0.16                   +------------+--------------+----------------------+------------+--------------+     0.30                         Mid calf           0.15  branching    +------------+--------------+----------------------+------------+--------------+     0.34                       Distal calf          0.19                   +------------+--------------+----------------------+------------+--------------+             not visualized        Ankle                                    +------------+--------------+----------------------+------------+--------------+ +----------------+--------------+--------------+----------------+--------------+ RT diameter (cm) RT Findings       SSV      LT Diameter (cm) LT Findings   +----------------+--------------+--------------+----------------+--------------+                  not visualized  Popliteal         0.25                                                       fossa                                    +----------------+--------------+--------------+----------------+--------------+                 not visualizedProximal calf       0.22                     +----------------+--------------+--------------+----------------+--------------+                 not visualized   Mid calf                   not visualized +----------------+--------------+--------------+----------------+--------------+                 not visualized Distal calf                  not visualized +----------------+--------------+--------------+----------------+--------------+ Diagnosing physician: Harold Barban MD Electronically signed by Harold Barban MD on 12/03/2021 at 7:53:38 PM.    Final     Microbiology: Results for orders placed or performed during the hospital encounter of 12/14/21  Blood culture (routine x 2)     Status: None   Collection Time: 12/14/21  3:40 PM   Specimen: BLOOD RIGHT HAND  Result Value Ref Range Status   Specimen Description BLOOD RIGHT HAND  Final   Special Requests   Final    BOTTLES DRAWN AEROBIC ONLY Blood Culture results may not be optimal due to an inadequate volume of blood received in culture bottles   Culture   Final    NO GROWTH 5 DAYS Performed at [facility], [place] [address] [place], [place] [address]    Report Status 12/19/2021 FINAL  Final  Blood culture (routine x 2)     Status: None   Collection Time: 12/14/21  6:40 PM   Specimen: BLOOD RIGHT FOREARM  Result Value Ref Range Status   Specimen Description BLOOD RIGHT FOREARM  Final   Special Requests   Final    BOTTLES DRAWN AEROBIC AND ANAEROBIC Blood  Culture adequate volume   Culture   Final    NO GROWTH 5 DAYS Performed at [facility], [place] [address] [place], [place] [address]    Report Status 12/19/2021 FINAL  Final  Surgical pcr screen      Status: Abnormal   Collection Time: 12/18/21  2:44 AM   Specimen: Nasal Mucosa; Nasal Swab  Result Value Ref Range Status   MRSA, PCR POSITIVE (A) NEGATIVE Final   Staphylococcus aureus POSITIVE (A) NEGATIVE Final    Comment: RESULTS CALLED TO READ BACK BY AND VERIFIED WITH RN [email or web address] '@[id number]'$  ON 12/18/2021 BY NM (NOTE) The Xpert SA Assay (FDA approved for NASAL specimens in patients 78 years of age and older), is one component of a comprehensive surveillance program. It is not intended to diagnose infection nor to guide or monitor treatment. Performed at [facility], [place] [address], [place], [place] [address]   Aerobic/Anaerobic Culture w Gram Stain (surgical/deep wound)     Status: None   Collection Time: 12/18/21 12:48 PM   Specimen: Soft Tissue, Other  Result Value Ref Range Status   Specimen Description TISSUE  Final   Special Requests LEFT FIFTH TOE  Final   Gram Stain   Final    ABUNDANT WBC PRESENT, PREDOMINANTLY PMN ABUNDANT GRAM POSITIVE COCCI IN PAIRS ABUNDANT GRAM NEGATIVE RODS    Culture   Final    ABUNDANT ESCHERICHIA COLI Confirmed Extended Spectrum Beta-Lactamase Producer (ESBL).  In bloodstream infections from ESBL organisms, carbapenems are preferred over piperacillin/tazobactam. They are shown to have a lower risk of mortality. MODERATE BACTEROIDES THETAIOTAOMICRON ABUNDANT PREVOTELLA SPECIES BETA LACTAMASE POSITIVE Performed at [facility], [place] [address] [place], [place] [address]    Report Status 12/21/2021 FINAL  Final   Organism ID, Bacteria ESCHERICHIA COLI  Final      Susceptibility   Escherichia coli - MIC*    AMPICILLIN >=32 RESISTANT Resistant     CEFAZOLIN >=64 RESISTANT Resistant     CEFEPIME >=32 RESISTANT Resistant     CEFTAZIDIME >=64 RESISTANT Resistant     CEFTRIAXONE >=64 RESISTANT Resistant     CIPROFLOXACIN >=4 RESISTANT Resistant     GENTAMICIN <=1 SENSITIVE Sensitive     IMIPENEM <=0.25 SENSITIVE Sensitive      TRIMETH/SULFA >=320 RESISTANT Resistant     AMPICILLIN/SULBACTAM >=32 RESISTANT Resistant     PIP/TAZO <=4 SENSITIVE Sensitive     * ABUNDANT ESCHERICHIA COLI  Aerobic/Anaerobic Culture w Gram Stain (surgical/deep wound)     Status: None (Preliminary result)   Collection Time: 12/20/21  8:17 AM   Specimen: PATH Bone biopsy; Tissue  Result Value Ref Range Status   Specimen Description BONE  Final   Special Requests LEFT CLEAN MARGIN  Final   Gram Stain   Final    NO WBC SEEN NO ORGANISMS SEEN Performed at [facility], [address], [place], [place] [address]    Culture   Final    RARE ESCHERICHIA COLI Confirmed Extended Spectrum Beta-Lactamase Producer (ESBL).  In bloodstream infections from ESBL organisms, carbapenems are preferred over piperacillin/tazobactam. They are shown to have a lower risk of mortality. RARE METHICILLIN RESISTANT STAPHYLOCOCCUS AUREUS NO ANAEROBES ISOLATED; CULTURE IN PROGRESS FOR 5 DAYS    Report Status PENDING  Incomplete   Organism ID, Bacteria ESCHERICHIA COLI  Final   Organism ID, Bacteria METHICILLIN RESISTANT STAPHYLOCOCCUS AUREUS  Final      Susceptibility   Escherichia coli - MIC*  AMPICILLIN >=32 RESISTANT Resistant     CEFAZOLIN >=64 RESISTANT Resistant     CEFEPIME >=32 RESISTANT Resistant     CEFTAZIDIME >=64 RESISTANT Resistant     CEFTRIAXONE >=64 RESISTANT Resistant     CIPROFLOXACIN >=4 RESISTANT Resistant     GENTAMICIN <=1 SENSITIVE Sensitive     IMIPENEM <=0.25 SENSITIVE Sensitive     TRIMETH/SULFA >=320 RESISTANT Resistant     AMPICILLIN/SULBACTAM >=32 RESISTANT Resistant     PIP/TAZO <=4 SENSITIVE Sensitive     * RARE ESCHERICHIA COLI   Methicillin resistant staphylococcus aureus - MIC*    CIPROFLOXACIN >=8 RESISTANT Resistant     ERYTHROMYCIN >=8 RESISTANT Resistant     GENTAMICIN <=0.5 SENSITIVE Sensitive     OXACILLIN >=4 RESISTANT Resistant     TETRACYCLINE <=1 SENSITIVE Sensitive     VANCOMYCIN <=0.5 SENSITIVE  Sensitive     TRIMETH/SULFA <=10 SENSITIVE Sensitive     CLINDAMYCIN <=0.25 SENSITIVE Sensitive     RIFAMPIN <=0.5 SENSITIVE Sensitive     Inducible Clindamycin NEGATIVE Sensitive     * RARE METHICILLIN RESISTANT STAPHYLOCOCCUS AUREUS    Labs: CBC: Recent Labs  Lab 12/20/21 0215 12/21/21 0353 12/22/21 0839 12/24/21 0107 12/24/21 0811  WBC 13.0* 15.2* 11.7* 11.7* 12.4*  NEUTROABS  --   --  6.6 7.1 7.5  HGB 10.0* 10.3* 10.5* 11.3* 11.6*  HCT 30.8* 30.8* 32.9* 35.1* 36.2*  MCV 77.2* 75.5* 78.0* 77.8* 77.8*  PLT 649* 746* 805* 980* 056*   Basic Metabolic Panel: Recent Labs  Lab 12/18/21 0118 12/19/21 0754 12/20/21 0215 12/21/21 0353 12/24/21 0107  NA 133* 135 132* 137 135  K 4.0 3.4* 3.7 3.7 4.4  CL 102 104 105 105 101  CO2 20* 22 20* 23 24  GLUCOSE 205* 165* 192* 215* 262*  BUN 11 7* '8 12 13  '$ CREATININE 0.79 0.76 0.62 0.71 0.68  CALCIUM 8.2* 8.2* 7.8* 8.3* 8.9  MG 1.9 2.0 2.0  --   --   PHOS 2.0* 2.1* 2.1* 2.1*  --    Liver Function Tests: Recent Labs  Lab 12/18/21 0118 12/19/21 0754 12/20/21 0215 12/21/21 0353  ALBUMIN 2.3* 1.9* 1.8* 1.9*   CBG: Recent Labs  Lab 12/23/21 1230 12/23/21 1639 12/23/21 1940 12/23/21 2309 12/24/21 0831  GLUCAP 251* 281* 286* 268* 202*    Discharge time spent: 39 minutes.   Signed: Hosie Poisson, MD [facility] 12/24/2021

## 2021-12-25 LAB — AEROBIC/ANAEROBIC CULTURE W GRAM STAIN (SURGICAL/DEEP WOUND): Gram Stain: NONE SEEN

## 2021-12-25 LAB — PATHOLOGIST SMEAR REVIEW

## 2021-12-27 ENCOUNTER — Other Ambulatory Visit: Payer: Self-pay

## 2021-12-27 DIAGNOSIS — I70223 Atherosclerosis of native arteries of extremities with rest pain, bilateral legs: Secondary | ICD-10-CM

## 2021-12-31 ENCOUNTER — Encounter: Payer: Self-pay | Admitting: Podiatry

## 2021-12-31 ENCOUNTER — Ambulatory Visit: Payer: Medicare HMO | Admitting: Podiatry

## 2021-12-31 ENCOUNTER — Ambulatory Visit (INDEPENDENT_AMBULATORY_CARE_PROVIDER_SITE_OTHER): Payer: Medicare HMO | Admitting: Podiatry

## 2021-12-31 DIAGNOSIS — Z89422 Acquired absence of other left toe(s): Secondary | ICD-10-CM

## 2021-12-31 DIAGNOSIS — E1142 Type 2 diabetes mellitus with diabetic polyneuropathy: Secondary | ICD-10-CM

## 2021-12-31 DIAGNOSIS — Z9889 Other specified postprocedural states: Secondary | ICD-10-CM

## 2022-01-07 NOTE — Progress Notes (Signed)
Subjective:  Patient ID: [name], male    DOB: 1955/07/22,  MRN: [id number]  Chief Complaint  Patient presents with   Wound Check    DOS: 12/18/2021 Procedure:  Left partial fifth ray amputation packed open with left partial hallux amputation  66 y.o. male returns for post-op check.  Patient states he is doing okay.  The wound VAC is being changed 3 times a week.  Pain is controlled he denies any other acute complaints.  Bandages clean dry and intact  Review of Systems: Negative except as noted in the HPI. Denies N/V/F/Ch.  Past Medical History:  Diagnosis Date   Diabetes mellitus with peripheral vascular disease    Hypertension    Neuropathy    PTSD (post-traumatic stress disorder)    Sleep apnea     Current Outpatient Medications:    albuterol (VENTOLIN HFA) 108 (90 Base) MCG/ACT inhaler, Inhale 2 puffs into the lungs every 6 (six) hours as needed for wheezing or shortness of breath., Disp: 1 each, Rfl: 0   amLODipine (NORVASC) 5 MG tablet, Take 1 tablet (5 mg total) by mouth every morning., Disp: 30 tablet, Rfl: 0   aspirin EC 81 MG tablet, Take 1 tablet (81 mg total) by mouth daily. Swallow whole., Disp: 150 tablet, Rfl: 2   atorvastatin (LIPITOR) 80 MG tablet, Take 1 tablet (80 mg total) by mouth at bedtime., Disp: 30 tablet, Rfl: 0   buPROPion (WELLBUTRIN XL) 300 MG 24 hr tablet, Take 1 tablet (300 mg total) by mouth every morning., Disp: 30 tablet, Rfl: 0   cholecalciferol (VITAMIN D) 25 MCG (1000 UNIT) tablet, Take 1,000 Units by mouth daily., Disp: , Rfl:    clopidogrel (PLAVIX) 75 MG tablet, Take 1 tablet (75 mg total) by mouth daily., Disp: 30 tablet, Rfl: 11   empagliflozin (JARDIANCE) 25 MG TABS tablet, Take 1 tablet (25 mg total) by mouth daily., Disp: 30 tablet, Rfl: 0   feeding supplement (ENSURE ENLIVE / ENSURE PLUS) LIQD, Take 237 mLs by mouth 2 (two) times daily between meals., Disp: 14220 mL, Rfl: 2   fluticasone furoate-vilanterol (BREO ELLIPTA) 100-25  MCG/INH AEPB, Inhale 1 puff into the lungs daily., Disp: 60 each, Rfl: 5   gabapentin (NEURONTIN) 600 MG tablet, Take 600 mg by mouth 2 (two) times daily., Disp: , Rfl:    insulin aspart (NOVOLOG) 100 UNIT/ML injection, CBG 70 - 120: 0 units CBG 121 - 150: 1 unit CBG 151 - 200: 2 units CBG 201 - 250: 3 units CBG 251 - 300: 5 units CBG 301 - 350: 7 units CBG 351 - 400: 9 units, Disp: 10 mL, Rfl: 11   insulin glargine-yfgn (SEMGLEE) 100 UNIT/ML injection, Inject 0.2 mLs (20 Units total) into the skin 2 (two) times daily., Disp: 10 mL, Rfl: 11   lisinopril (ZESTRIL) 20 MG tablet, Take 20 mg by mouth daily., Disp: , Rfl:    lurasidone (LATUDA) 40 MG TABS tablet, Take 1 tablet (40 mg total) by mouth daily with supper. (Patient not taking: Reported on 12/14/2021), Disp: 30 tablet, Rfl: 0   meclizine (ANTIVERT) 25 MG tablet, Take 1 tablet (25 mg total) by mouth 3 (three) times daily as needed for dizziness., Disp: 30 tablet, Rfl: 0   metoprolol tartrate (LOPRESSOR) 50 MG tablet, Take 1 tablet (50 mg total) by mouth 2 (two) times daily., Disp: 60 tablet, Rfl: 0   OLANZapine (ZYPREXA) 2.5 MG tablet, Take 1 tablet (2.5 mg total) by mouth at bedtime., Disp: 30  tablet, Rfl: 0   omeprazole (PRILOSEC) 20 MG capsule, Take 1 capsule (20 mg total) by mouth at bedtime., Disp: 30 capsule, Rfl: 0   OXcarbazepine (TRILEPTAL) 150 MG tablet, Take 1 tablet (150 mg total) by mouth 2 (two) times daily., Disp: 60 tablet, Rfl: 0   senna-docusate (SENOKOT-S) 8.6-50 MG tablet, Take 1 tablet by mouth at bedtime as needed for mild constipation., Disp: , Rfl:   Social History   Tobacco Use  Smoking Status Former   Types: Cigarettes   Passive exposure: Never  Smokeless Tobacco Never    Allergies  Allergen Reactions   Gemfibrozil Other (See Comments)    Doesn't know its been awhile   Oxycontin [Oxycodone Hcl] Other (See Comments)    Bad reaction "took me to a place I never wanna go again"   Pork-Derived Products     Simvastatin Other (See Comments)    Says he doesn't know its been awhile   Objective:  There were no vitals filed for this visit. There is no height or weight on file to calculate BMI. Constitutional Well developed. Well nourished.  Vascular Foot warm and well perfused. Capillary refill normal to all digits.   Neurologic Normal speech. Oriented to person, place, and time. Epicritic sensation to light touch grossly present bilaterally.  Dermatologic Skin healing well without signs of infection. Skin edges well coapted without signs of infection.  Orthopedic: Tenderness to palpation noted about the surgical site.   Radiographs: None Assessment:   1. Post-operative state   2. Type 2 diabetes mellitus with peripheral neuropathy (HCC)   3. 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  S/p foot surgery left -Progressing as expected post-operatively. -XR: See above -WB Status: Partial weightbearing to the heel in surgical shoe -Wound VAC is intact.  Wound VAC was removed and a granular wound that was noted underneath.  No signs of purulent drainage or bone exposure noted. -Medications: None -Continue Betadine wet-to-dry dressing to the hallux and wound VAC to the lateral foot  Return in about 3 weeks (around 01/21/2022).

## 2022-01-08 ENCOUNTER — Ambulatory Visit (INDEPENDENT_AMBULATORY_CARE_PROVIDER_SITE_OTHER): Payer: Medicare HMO | Admitting: Internal Medicine

## 2022-01-08 ENCOUNTER — Other Ambulatory Visit: Payer: Self-pay

## 2022-01-08 VITALS — BP 113/77 | HR 72 | Resp 16 | Ht 72.0 in

## 2022-01-08 DIAGNOSIS — M869 Osteomyelitis, unspecified: Secondary | ICD-10-CM

## 2022-01-08 DIAGNOSIS — E11628 Type 2 diabetes mellitus with other skin complications: Secondary | ICD-10-CM | POA: Diagnosis not present

## 2022-01-08 DIAGNOSIS — L089 Local infection of the skin and subcutaneous tissue, unspecified: Secondary | ICD-10-CM

## 2022-01-08 NOTE — Telephone Encounter (Signed)
Appt scheduled

## 2022-01-08 NOTE — Progress Notes (Signed)
[facility]  Reason for Consult:diabetic/pvd right foot om  Referring Provider: Heber [place]    Patient Active Problem List   Diagnosis Date Noted   Hypokalemia 12/19/2021   Hypophosphatemia 12/19/2021   Hyponatremia 12/18/2021   Bandemia 12/17/2021   Thrombocytosis 12/17/2021   Acute osteomyelitis of phalanx of foot, left ([place]) 12/16/2021   Acute osteomyelitis of metacarpal bone, left ([place]) 12/16/2021   Tenosynovitis of left foot 12/16/2021   S/P transmetatarsal amputation of foot, right ([place]) 12/16/2021   Diabetic foot ulcer with osteomyelitis ([place]) 12/16/2021   Severe sepsis ([place]) 12/16/2021   Normocytic anemia 12/16/2021   Gangrene of left foot ([place]) 12/14/2021   PAD (peripheral artery disease) ([place]) 12/14/2021   Hyperlipidemia associated with type 2 diabetes mellitus ([place]) 12/14/2021   At risk for adverse drug event 09/23/2021   Cellulitis in diabetic foot ([place]) 09/13/2021   Dyslipidemia 09/13/2021   Depression 09/13/2021   Hypertension associated with diabetes ([place]) 09/13/2021   AKI (acute kidney injury) ([place]) 09/13/2021   GERD without esophagitis 09/13/2021   Obstructive sleep apnea 09/13/2021   Gangrene of toe of right foot ([place]) 09/13/2021   Type 2 diabetes mellitus ([place]) 08/15/2021      HPI: Irwin Toran Celia is a 66 y.o. male PVD, dm2, here for diabetic foot infection  I reviewed epic and outpatient wound clinic referal; first visit with ID 6/07  Patient has had a right foot ischemic ulcer. He had multiple procedures as below in the past few months  Procedures: 6/05 vascular surgery LLE angiogram for left toe gangrene: laser atherectomy of left posterior tibial artery and angioplasty; stent left SFA 5/31 vascular surgery RLE & LLE angiogram and aortogram: finding see below 3/17 podiatry right TMA 3/15 podiatry right foot I&D 3/13 vascular surgery RLE below-knee popliteal artery to posterior tibial artery bypass using  nonreversed greater saphenous vein. 4th MT ray amputation with delayed closure 3/08 vascular surgery aortogram and RLE angioplasty superficial femoral artery   Angiogram finding from 5/31: Findings:  Aortogram: Bilateral renal arteries patent, no flow-limiting stenosis appreciated in the aortoiliac segment   On the right: Widely patent common femoral artery, profunda, mild disease throughout the superficial femoral artery, no flow-limiting stenosis.  No flow-limiting stenosis in the popliteal artery.  Proximal anastomosis of the popliteal to posterior tibial artery bypass widely patent.  Small area of focal stenosis greater than 95% appreciated in the mid bypass.  No stenosis appreciated distally.  Excellent posterior tibial runoff into the foot.   On the left: Widely patent common femoral artery, profunda.  Two tandem, tapering stenoses appreciated in the superficial femoral artery with occlusion.  Popliteal artery without flow-limiting disease.  No named vessels in the calf with reconstitution at the distal posterior tibial artery roughly 15 cm from the malleolus.  This is sizable, and continues into the foot   He has been seeing wound clinic as well. On 5/04 he had a right foot bone biopsy that showed "acute osteomyelitis." There was no label on the sample to identify what part of the right foot.   From wound clinic note: 10/16/2020 --> "He arrives today with large wound over the entirety of his amputation site extending proximally. The flap closure has failed with some sutures still in place. He is not systemically unwell. Was at snf 4/28 --> dakin's wet-to-dry dressing. At this time discharged home from snf 5/04 --> no systemic signs of infection. Exam -- right foot tma with large  wound dehiscence. Exposed bone that appears to be first and third mt. Tissue present is mostly granulating and some nonviable. Foul odor noted. Bone biopsy done    The biopsy again states acute osteomyelitis/no  malignancy. There is no actual description or gram stain or any IHC stain report otherwise.  There was no bone culture done  He is here with his wife today  He also reports he has been on cephalexin for a month    ------------------- 01/08/22 id clinic f/u He came from nursing home [facility] I previously see him for right foot open wound He was admitted for diabetic foot infection and seen by dr Linus Salmons in the hospital on 6/17.  Prior to admission had left angiogram with stent left sfa and laser athrectomyleft posterior tibial artery  He underwent angiogram 6/14 no intervention done as there was maximally revascularized vessel He underwent amputation partial left 5th ray and dip joint disarticulation of hallux on 6/15 and repeat I&D 6/17; the culture was esbl ecoli and mrsa. Admission blood cx 6/11 was negative He saw podiatry on follow up on 6/28. No concern. Wound vac remains.  He had received 7 day vanc/cefepime/metronidazole by 6/17 and was changed to meropenem on that day and ultimately 7 more days of doxy on 6/21 discharge date  He has left 5th MT margin cleared of OM on 6/15, however on 6/17 path where he had more bone debridement of the 5th mt that shows OM  He reports right foot wound is closing/doing well  No f/c/n/v/diarrhea  Review of Systems: ROS All other ros negative      Past Medical History:  Diagnosis Date   Diabetes mellitus with peripheral vascular disease    Hypertension    Neuropathy    PTSD (post-traumatic stress disorder)    Sleep apnea     Social History   Tobacco Use   Smoking status: Former    Types: Cigarettes    Passive exposure: Never   Smokeless tobacco: Never  Vaping Use   Vaping Use: Never used  Substance Use Topics   Alcohol use: No   Drug use: Never    Family History  Problem Relation Age of Onset   Stroke Mother    Coronary artery disease Father    COPD Brother     Allergies  Allergen Reactions    Gemfibrozil Other (See Comments)    Doesn't know its been awhile   Oxycontin [Oxycodone Hcl] Other (See Comments)    Bad reaction "took me to a place I never wanna go again"   Pork-Derived Products    Simvastatin Other (See Comments)    Says he doesn't know its been awhile    OBJECTIVE: Vitals:   01/08/22 1640  BP: 113/77  Pulse: 72  Resp: 16  SpO2: 98%  Height: 6' (1.829 m)   Body mass index is 26.91 kg/m.   Physical Exam General: patient in wheel chair Heent: normocephalic Cv: rrr no mrg Lungs: normal respiratory effort Msk/ext: right foot dressing clear; left foot hallux amputation site intact without redness/sweling/discharge; lateral left foot wound vac in place and no surrounding erythema/redness        Lab: Lab Results  Component Value Date   WBC 12.4 (H) 12/24/2021   HGB 11.6 (L) 12/24/2021   HCT 36.2 (L) 12/24/2021   MCV 77.8 (L) 12/24/2021   PLT 817 (H) 41/96/2229   Last metabolic panel Lab Results  Component Value Date   GLUCOSE 262 (H) 12/24/2021  NA 135 12/24/2021   K 4.4 12/24/2021   CL 101 12/24/2021   CO2 24 12/24/2021   BUN 13 12/24/2021   CREATININE 0.68 12/24/2021   GFRNONAA >60 12/24/2021   CALCIUM 8.9 12/24/2021   PHOS 2.1 (L) 12/21/2021   PROT 8.0 12/14/2021   ALBUMIN 1.9 (L) 12/21/2021   BILITOT 0.6 12/14/2021   ALKPHOS 82 12/14/2021   AST 21 12/14/2021   ALT 22 12/14/2021   ANIONGAP 10 12/24/2021    Microbiology:  Serology:  Imaging: I have reviewed and incoroporated imaging finding into decisoin making  09/19/21 xray right foot FINDINGS: Post transmetatarsal amputation of all 5 rays. Resection margins are smooth. Possible soft tissue gas in the overlying dorsal soft tissues of the midfoot, obscured by overlying dressing. No erosive or bony destructive changes of the mid and hindfoot.   IMPRESSION: Prior transmetatarsal amputation of all 5 rays. Possible soft tissue gas in the overlying soft tissues, partially  obscured by overlying dressing.    Assessment/plan: Problem List Items Addressed This Visit   None Visit Diagnoses     Diabetic foot infection ([place])    -  Primary   Osteomyelitis, unspecified site, unspecified type ([place])            Explains pathogenesis of dm foot infection, diagnosis requirement  Explain to him I am not sure if he has a bone infection at this time without culture data. Path indicate osteomyelitis which could be reactive inflammatory cells in the bone. The term "acute" leans toward true infection but again need culture  Eitherway, this needs definitive surgical management at least more I&D and Flap coverage otherwise it is not possible to cure this with antibiotics  ---------- 01/08/22 assessment The right foot wound appears to be doing well since I last saw him off abx He is s/p left hallux dip joint disarticulation and partial I&D of the proximal phalanx with path evidence OM; cx esbl ecoli He is then s/p further I&D of the 5th mt that was resected distally; the second I&D path showed OM despite the previous resection margin being negative; cx esbl ecoli and mrsa    There is no oral option available for his esbl (cipro/bactrim)  Would do 6 weeks doxy orally and ertapenem 1 gram daily  Follow up in 3-4 weeks with dr Linus Salmons  -weekly cbc, cmp, crp while on iv ertapnenem 1 gram daily -ertapenem 1 gram daily iv until 8/18 -doxy 100 mg po bid until 8/18 -orders provided in snf paperwork   I have spent a total of 40 minutes of face-to-face and non-face-to-face time, excluding clinical staff time, preparing to see patient, ordering tests and/or medications, and provide counseling the patient    Follow-up: Return in about 4 weeks (around 02/05/2022).  Jabier Mutton, [facility] 01/08/2022, 4:31 PM

## 2022-01-08 NOTE — Addendum Note (Signed)
Addended by: Prudencio Pair T on: 01/08/2022 05:09 PM   Modules accepted: Orders

## 2022-01-08 NOTE — Patient Instructions (Signed)
We need to treat you for another 6 weeks as you pathology showed residual infection in the lateral side of the foot   One antibiotics is IV 1gram ertapenem daily One is oral doxycycline 100 mg twice a day   Duration is 6 weeks until 8/18  Picc will be placed hopefully next week by our radiologist  Start doxyycline today, start ertapenem when picc is placed   See dr comer in 3-4 weeks

## 2022-01-09 ENCOUNTER — Encounter (HOSPITAL_COMMUNITY): Payer: Medicare HMO

## 2022-01-09 ENCOUNTER — Telehealth: Payer: Self-pay

## 2022-01-09 ENCOUNTER — Encounter: Payer: Medicare HMO | Admitting: Vascular Surgery

## 2022-01-09 NOTE — Telephone Encounter (Signed)
picc placement [place].  Provider: Dr Gale Journey  PICC place [place]: 01/15/22 11 am  END DATE: 02/20/22  Orders sent to facility a([facility] [phone]) and lab orders as well with fax number for results.  Called Facility and spoke to [place]. She is checking to see if facility can place picc line since they will administer meds. She will call me back if needing to cancel 01/15/22 appt with IR. Otherwise they are aware of placement appt.   Notified [place] for OPAT following

## 2022-01-09 NOTE — Telephone Encounter (Signed)
Noted - thanks Roselyn Reef!

## 2022-01-13 NOTE — Telephone Encounter (Signed)
Call placed to skilled nursing facility to ensure that they where able to coordinate transportation for patient Picc line placement scheduled for 01/15/22. Left triage phone number with receptionist, Bethena Roys. Will await return call to confirm.  Eugenia Mcalpine

## 2022-01-15 ENCOUNTER — Ambulatory Visit (HOSPITAL_COMMUNITY): Payer: Medicare HMO

## 2022-01-15 ENCOUNTER — Ambulatory Visit: Payer: Medicare HMO | Admitting: Podiatry

## 2022-01-22 NOTE — Progress Notes (Unsigned)
Office Note     CC:  follow up Requesting Provider:  Harvie Junior, MD  HPI: [name] is a 66 y.o. ([birth year]) male well-known to me who has severe peripheral arterial disease in bilateral lower extremities.    Surgical history includes: Right below-knee pop to posterior tibial artery bypass using vein, with subsequent balloon angioplasty for assisted primary patency at retained valve. Right TMA. Left SFA stenting, tibial laser atherectomy, angioplasty Left fifth ray amputation  On exam today, [name] was doing well.  Bilateral feet were wrapped -these are being managed by podiatry.  He stated both lower extremity wounds were healing well.  He denied drainage, foul smell, purulence.  He also denied fever, chills.    Past Medical History:  Diagnosis Date   Diabetes mellitus with peripheral vascular disease    Hypertension    Neuropathy    PTSD (post-traumatic stress disorder)    Sleep apnea     Past Surgical History:  Procedure Laterality Date   ABDOMINAL AORTOGRAM W/LOWER EXTREMITY Right 09/10/2021   Procedure: ABDOMINAL AORTOGRAM W/LOWER EXTREMITY;  Surgeon: [name], MD;  Location: [place] [facility];  Service: Cardiovascular;  Laterality: Right;   ABDOMINAL AORTOGRAM W/LOWER EXTREMITY N/A 12/17/2021   Procedure: ABDOMINAL AORTOGRAM W/LOWER EXTREMITY;  Surgeon: [name], MD;  Location: [place] [facility];  Service: Cardiovascular;  Laterality: N/A;   ABDOMINAL AORTOGRAM W/LOWER EXTREMITY N/A 12/03/2021   Procedure: ABDOMINAL AORTOGRAM W/LOWER EXTREMITY;  Surgeon: [name], MD;  Location: [place] [facility];  Service: Cardiovascular;  Laterality: N/A;   ABDOMINAL HERNIA REPAIR     AMPUTATION Right 09/19/2021   Procedure: TRANSMETATARSAL AMPUTATION FOOT;  Surgeon: Lorenda Peck, MD;  Location: [place];  Service: Podiatry;  Laterality: Right;  Surgical team to do local block   AMPUTATION Left 12/20/2021   Procedure: LEFT FOOT REVISION PARTIAL  FIRST RAY AMPUTATION WITH PLACEMENT OF PRESSURE WOUND VAC;  Surgeon: [name], DPM;  Location: [place];  Service: Podiatry;  Laterality: Left;   AMPUTATION TOE Right 09/15/2021   Procedure: AMPUTATION 4th TOE;  Surgeon: [name], MD;  Location: [place];  Service: Vascular;  Laterality: Right;   AMPUTATION TOE Left 12/18/2021   Procedure: AMPUTATION LEFT BIG TOE; LEFT 5TH RAY PARTIAL AMPUTATION;  Surgeon: [name], DPM;  Location: [place];  Service: Podiatry;  Laterality: Left;   BACK SURGERY     CORONARY ATHERECTOMY N/A 12/08/2021   Procedure: CORONARY ATHERECTOMY;  Surgeon: Waynetta Sandy, MD;  Location: [place] [facility];  Service: Cardiovascular;  Laterality: N/A;  Laser - Lt. PT   FEMORAL-TIBIAL BYPASS GRAFT Right 09/15/2021   Procedure: BYPASS GRAFT BELOW KNEE POPLITEAL ARTERY TO POSTERIOR TIBIAL ARTERY;  Surgeon: [name], MD;  Location: [place];  Service: Vascular;  Laterality: Right;   IRRIGATION AND DEBRIDEMENT FOOT Right 09/17/2021   Procedure: IRRIGATION AND DEBRIDEMENT FOOT;  Surgeon: Lorenda Peck, MD;  Location: [place];  Service: Podiatry;  Laterality: Right;  Suregeon will do anesthesia block   KNEE SURGERY     LOWER EXTREMITY ANGIOGRAPHY Left 12/08/2021   Procedure: Lower Extremity Angiography;  Surgeon: Waynetta Sandy, MD;  Location: [place] [facility];  Service: Cardiovascular;  Laterality: Left;   PERIPHERAL VASCULAR BALLOON ANGIOPLASTY  09/10/2021   Procedure: PERIPHERAL VASCULAR BALLOON ANGIOPLASTY;  Surgeon: [name], MD;  Location: [place] [facility];  Service: Cardiovascular;;  rt sfa pta   PERIPHERAL VASCULAR BALLOON ANGIOPLASTY Right  12/03/2021   Procedure: PERIPHERAL VASCULAR BALLOON ANGIOPLASTY;  Surgeon: [name], MD;  Location: [place] [facility];  Service: Cardiovascular;  Laterality: Right;   PERIPHERAL VASCULAR INTERVENTION Left 12/08/2021   Procedure: PERIPHERAL VASCULAR INTERVENTION;  Surgeon: Waynetta Sandy, MD;  Location: [place] [facility];  Service: Cardiovascular;  Laterality: Left;  LT. SFA   SHOULDER SURGERY     VEIN HARVEST Right 09/15/2021   Procedure: GREATER SAPHENOUS VEIN HARVEST;  Surgeon: [name], MD;  Location: [facility];  Service: Vascular;  Laterality: Right;    Social History   Socioeconomic History   Marital status: Married    Spouse name: Not on file   Number of children: Not on file   Years of education: Not on file   Highest education level: Not on file  Occupational History   Not on file  Tobacco Use   Smoking status: Former    Types: Cigarettes    Passive exposure: Never   Smokeless tobacco: Never  Vaping Use   Vaping Use: Never used  Substance and Sexual Activity   Alcohol use: No   Drug use: Never   Sexual activity: Not on file  Other Topics Concern   Not on file  Social History Narrative   Not on file   Social Determinants of Health   Financial Resource Strain: Not on file  Food Insecurity: Not on file  Transportation Needs: Not on file  Physical Activity: Not on file  Stress: Not on file  Social Connections: Not on file  Intimate Partner Violence: Not on file    Family History  Problem Relation Age of Onset   Stroke Mother    Coronary artery disease Father    COPD Brother     Current Outpatient Medications  Medication Sig Dispense Refill   albuterol (VENTOLIN HFA) 108 (90 Base) MCG/ACT inhaler Inhale 2 puffs into the lungs every 6 (six) hours as needed for wheezing or shortness of breath. 1 each 0   amLODipine (NORVASC) 5 MG tablet Take 1 tablet (5 mg total) by mouth every morning. 30 tablet 0   aspirin EC 81 MG tablet Take 1 tablet (81 mg total) by mouth daily. Swallow whole. 150 tablet 2   atorvastatin (LIPITOR) 80 MG tablet Take 1 tablet (80 mg total) by mouth at bedtime. 30 tablet 0   buPROPion (WELLBUTRIN XL) 300 MG 24 hr tablet Take 1 tablet (300 mg total) by mouth every morning. 30 tablet 0   cholecalciferol  (VITAMIN D) 25 MCG (1000 UNIT) tablet Take 1,000 Units by mouth daily.     clopidogrel (PLAVIX) 75 MG tablet Take 1 tablet (75 mg total) by mouth daily. 30 tablet 11   empagliflozin (JARDIANCE) 25 MG TABS tablet Take 1 tablet (25 mg total) by mouth daily. 30 tablet 0   feeding supplement (ENSURE ENLIVE / ENSURE PLUS) LIQD Take 237 mLs by mouth 2 (two) times daily between meals. 14220 mL 2   fluticasone furoate-vilanterol (BREO ELLIPTA) 100-25 MCG/INH AEPB Inhale 1 puff into the lungs daily. 60 each 5   gabapentin (NEURONTIN) 600 MG tablet Take 600 mg by mouth 2 (two) times daily.     insulin aspart (NOVOLOG) 100 UNIT/ML injection CBG 70 - 120: 0 units CBG 121 - 150: 1 unit CBG 151 - 200: 2 units CBG 201 - 250: 3 units CBG 251 - 300: 5 units CBG 301 - 350: 7 units CBG 351 - 400: 9 units 10 mL 11  insulin glargine-yfgn (SEMGLEE) 100 UNIT/ML injection Inject 0.2 mLs (20 Units total) into the skin 2 (two) times daily. 10 mL 11   lisinopril (ZESTRIL) 20 MG tablet Take 20 mg by mouth daily.     lurasidone (LATUDA) 40 MG TABS tablet Take 1 tablet (40 mg total) by mouth daily with supper. 30 tablet 0   meclizine (ANTIVERT) 25 MG tablet Take 1 tablet (25 mg total) by mouth 3 (three) times daily as needed for dizziness. 30 tablet 0   metoprolol tartrate (LOPRESSOR) 50 MG tablet Take 1 tablet (50 mg total) by mouth 2 (two) times daily. 60 tablet 0   OLANZapine (ZYPREXA) 2.5 MG tablet Take 1 tablet (2.5 mg total) by mouth at bedtime. 30 tablet 0   omeprazole (PRILOSEC) 20 MG capsule Take 1 capsule (20 mg total) by mouth at bedtime. 30 capsule 0   OXcarbazepine (TRILEPTAL) 150 MG tablet Take 1 tablet (150 mg total) by mouth 2 (two) times daily. 60 tablet 0   senna-docusate (SENOKOT-S) 8.6-50 MG tablet Take 1 tablet by mouth at bedtime as needed for mild constipation.     No current facility-administered medications for this visit.    Allergies  Allergen Reactions   Gemfibrozil Other (See Comments)     Doesn't know its been awhile   Oxycontin [Oxycodone Hcl] Other (See Comments)    Bad reaction "took me to a place I never wanna go again"   Pork-Derived Products    Simvastatin Other (See Comments)    Says he doesn't know its been awhile     REVIEW OF SYSTEMS:   '[X]'$  denotes positive finding, '[ ]'$  denotes negative finding Cardiac  Comments:  Chest pain or chest pressure:    Shortness of breath upon exertion:    Short of breath when lying flat:    Irregular heart rhythm:        Vascular    Pain in calf, thigh, or hip brought on by ambulation:    Pain in feet at night that wakes you up from your sleep:     Blood clot in your veins:    Leg swelling:         Pulmonary    Oxygen at home:    Productive cough:     Wheezing:         Neurologic    Sudden weakness in arms or legs:     Sudden numbness in arms or legs:     Sudden onset of difficulty speaking or slurred speech:    Temporary loss of vision in one eye:     Problems with dizziness:         Gastrointestinal    Blood in stool:     Vomited blood:         Genitourinary    Burning when urinating:     Blood in urine:        Psychiatric    Major depression:         Hematologic    Bleeding problems:    Problems with blood clotting too easily:        Skin    Rashes or ulcers:        Constitutional    Fever or chills:      PHYSICAL EXAMINATION:  There were no vitals filed for this visit.   General:  WDWN in NAD; vital signs documented above Gait: Not observed HENT: WNL, normocephalic Pulmonary: normal non-labored breathing , without Rales, rhonchi,  wheezing Cardiac: regular  HR Abdomen: soft, NT, no masses Skin: without rashes Vascular Exam/Pulses: Excellent Doppler signals bilaterally. Extremities: Feet bandaged. Musculoskeletal: no muscle wasting or atrophy  Neurologic: A&O X 3;  No focal weakness or paresthesias are detected Psychiatric:  The pt has Normal affect.   Non-Invasive Vascular Imaging:    Right lower extremity patent bypass, some areas of stenosis, nonflow limiting.  Stable ABI Left lower extremity SFA stents widely patent, some stenosis within the native posterior tibial artery, however patent. ABIs reviewed demonstrating stable ABI in the right lower extremity slightly depressed ABI in the left lower extremity.  Left lower extremity was recently angio demonstrating no flow-limiting stenosis, therefore we will continue to treat conservatively.  ASSESSMENT/PLAN:: 66 y.o. male here for follow up status post right lower extremity angiogram for assisted bypass patency-retained valve lysis, left lower extremity SFA pop stenting, native posterior tibial artery angioplasty.  ABIs reviewed demonstrating stable ABI in the right lower extremity slightly depressed ABI in the left lower extremity.  Left lower extremity was recently angio demonstrating no flow-limiting stenosis, therefore we will continue to treat conservatively.  He was asked to call my office should any questions or concerns arise or should wound healing stagnate. I plan to see him in 3 months.   [name],  [facility] [phone]

## 2022-01-23 ENCOUNTER — Encounter: Payer: Medicare HMO | Admitting: Vascular Surgery

## 2022-01-23 ENCOUNTER — Ambulatory Visit (HOSPITAL_COMMUNITY)
Admission: RE | Admit: 2022-01-23 | Discharge: 2022-01-23 | Disposition: A | Discharge status: Home / Self Care | Payer: Medicare HMO | Source: Ambulatory Visit | Attending: Vascular Surgery | Admitting: Vascular Surgery

## 2022-01-23 ENCOUNTER — Ambulatory Visit (INDEPENDENT_AMBULATORY_CARE_PROVIDER_SITE_OTHER): Payer: Medicare HMO | Admitting: Vascular Surgery

## 2022-01-23 ENCOUNTER — Ambulatory Visit (INDEPENDENT_AMBULATORY_CARE_PROVIDER_SITE_OTHER)
Admit: 2022-01-23 | Discharge: 2022-01-23 | Disposition: A | Discharge status: Home / Self Care | Payer: Medicare HMO | Attending: Vascular Surgery | Admitting: Vascular Surgery

## 2022-01-23 VITALS — BP 113/70 | HR 64 | Temp 97.7°F | Resp 20 | Ht 72.0 in | Wt 198.0 lb

## 2022-01-23 DIAGNOSIS — I70223 Atherosclerosis of native arteries of extremities with rest pain, bilateral legs: Secondary | ICD-10-CM

## 2022-01-28 ENCOUNTER — Other Ambulatory Visit: Payer: Self-pay

## 2022-01-28 DIAGNOSIS — I70223 Atherosclerosis of native arteries of extremities with rest pain, bilateral legs: Secondary | ICD-10-CM

## 2022-01-30 ENCOUNTER — Ambulatory Visit (INDEPENDENT_AMBULATORY_CARE_PROVIDER_SITE_OTHER): Payer: Medicare HMO | Admitting: Podiatry

## 2022-01-30 DIAGNOSIS — E1142 Type 2 diabetes mellitus with diabetic polyneuropathy: Secondary | ICD-10-CM

## 2022-01-30 DIAGNOSIS — Z9889 Other specified postprocedural states: Secondary | ICD-10-CM

## 2022-02-05 NOTE — Progress Notes (Signed)
Subjective:  Patient ID: [name], male    DOB: [birth year],  MRN: [id number]  Chief Complaint  Patient presents with   Routine Post Op    3 week follow up, pain level is at a 3 on right foot, no numbness/tingling    DOS: 12/18/2021 Procedure:  Left partial fifth ray amputation packed open with left partial hallux amputation  66 y.o. male returns for post-op check.  Patient states he is doing okay.  The wound VAC is being changed 3 times a week.  Pain is controlled he denies any other acute complaints.  Bandages clean dry and intact  Review of Systems: Negative except as noted in the HPI. Denies N/V/F/Ch.  Past Medical History:  Diagnosis Date   Diabetes mellitus with peripheral vascular disease    Hypertension    Neuropathy    PTSD (post-traumatic stress disorder)    Sleep apnea     Current Outpatient Medications:    albuterol (VENTOLIN HFA) 108 (90 Base) MCG/ACT inhaler, Inhale 2 puffs into the lungs every 6 (six) hours as needed for wheezing or shortness of breath., Disp: 1 each, Rfl: 0   amLODipine (NORVASC) 5 MG tablet, Take 1 tablet (5 mg total) by mouth every morning., Disp: 30 tablet, Rfl: 0   aspirin EC 81 MG tablet, Take 1 tablet (81 mg total) by mouth daily. Swallow whole., Disp: 150 tablet, Rfl: 2   atorvastatin (LIPITOR) 80 MG tablet, Take 1 tablet (80 mg total) by mouth at bedtime., Disp: 30 tablet, Rfl: 0   buPROPion (WELLBUTRIN XL) 300 MG 24 hr tablet, Take 1 tablet (300 mg total) by mouth every morning., Disp: 30 tablet, Rfl: 0   cholecalciferol (VITAMIN D) 25 MCG (1000 UNIT) tablet, Take 1,000 Units by mouth daily., Disp: , Rfl:    clopidogrel (PLAVIX) 75 MG tablet, Take 1 tablet (75 mg total) by mouth daily., Disp: 30 tablet, Rfl: 11   empagliflozin (JARDIANCE) 25 MG TABS tablet, Take 1 tablet (25 mg total) by mouth daily., Disp: 30 tablet, Rfl: 0   feeding supplement (ENSURE ENLIVE / ENSURE PLUS) LIQD, Take 237 mLs by mouth 2 (two) times daily between  meals., Disp: 14220 mL, Rfl: 2   fluticasone furoate-vilanterol (BREO ELLIPTA) 100-25 MCG/INH AEPB, Inhale 1 puff into the lungs daily., Disp: 60 each, Rfl: 5   gabapentin (NEURONTIN) 600 MG tablet, Take 600 mg by mouth 2 (two) times daily., Disp: , Rfl:    insulin aspart (NOVOLOG) 100 UNIT/ML injection, CBG 70 - 120: 0 units CBG 121 - 150: 1 unit CBG 151 - 200: 2 units CBG 201 - 250: 3 units CBG 251 - 300: 5 units CBG 301 - 350: 7 units CBG 351 - 400: 9 units, Disp: 10 mL, Rfl: 11   insulin glargine-yfgn (SEMGLEE) 100 UNIT/ML injection, Inject 0.2 mLs (20 Units total) into the skin 2 (two) times daily., Disp: 10 mL, Rfl: 11   lisinopril (ZESTRIL) 20 MG tablet, Take 20 mg by mouth daily., Disp: , Rfl:    lurasidone (LATUDA) 40 MG TABS tablet, Take 1 tablet (40 mg total) by mouth daily with supper., Disp: 30 tablet, Rfl: 0   meclizine (ANTIVERT) 25 MG tablet, Take 1 tablet (25 mg total) by mouth 3 (three) times daily as needed for dizziness., Disp: 30 tablet, Rfl: 0   metoprolol tartrate (LOPRESSOR) 50 MG tablet, Take 1 tablet (50 mg total) by mouth 2 (two) times daily., Disp: 60 tablet, Rfl: 0   OLANZapine (ZYPREXA) 2.5 MG  tablet, Take 1 tablet (2.5 mg total) by mouth at bedtime., Disp: 30 tablet, Rfl: 0   omeprazole (PRILOSEC) 20 MG capsule, Take 1 capsule (20 mg total) by mouth at bedtime., Disp: 30 capsule, Rfl: 0   OXcarbazepine (TRILEPTAL) 150 MG tablet, Take 1 tablet (150 mg total) by mouth 2 (two) times daily., Disp: 60 tablet, Rfl: 0   senna-docusate (SENOKOT-S) 8.6-50 MG tablet, Take 1 tablet by mouth at bedtime as needed for mild constipation., Disp: , Rfl:   Social History   Tobacco Use  Smoking Status Former   Types: Cigarettes   Passive exposure: Never  Smokeless Tobacco Never    Allergies  Allergen Reactions   Gemfibrozil Other (See Comments)    Doesn't know its been awhile   Oxycontin [Oxycodone Hcl] Other (See Comments)    Bad reaction "took me to a place I never wanna go  again"   Pork-Derived Products    Simvastatin Other (See Comments)    Says he doesn't know its been awhile   Objective:  There were no vitals filed for this visit. There is no height or weight on file to calculate BMI. Constitutional Well developed. Well nourished.  Vascular Foot warm and well perfused. Capillary refill normal to all digits.   Neurologic Normal speech. Oriented to person, place, and time. Epicritic sensation to light touch grossly present bilaterally.  Dermatologic Left partial fifth ray wound healing well.  Granular wound bed noted no exposure of bone noted.  Malodor present but no purulent drainage noted.  Orthopedic: Tenderness to palpation noted about the surgical site.   Radiographs: None Assessment:   1. Post-operative state   2. Type 2 diabetes mellitus with peripheral neuropathy (HCC)     Plan:  Patient was evaluated and treated and all questions answered.  S/p foot surgery left -Progressing as expected post-operatively. -XR: See above -WB Status: Partial weightbearing to the heel in surgical shoe -Wound VAC is intact.  Wound VAC was removed and a granular wound that was noted underneath.  No signs of purulent drainage or bone exposure noted. -Medications: None -Hallux wound has reepithelialized.  No signs of Deis is noted. -Please reapply the VAC at nursing facility.  Betadine wet-to-dry dressing applied for today  No follow-ups on file.

## 2022-02-09 ENCOUNTER — Ambulatory Visit: Payer: Medicare HMO | Admitting: Infectious Diseases

## 2022-02-09 ENCOUNTER — Telehealth: Payer: Self-pay

## 2022-02-09 NOTE — Telephone Encounter (Signed)
Patient missed appointment today with Colletta Maryland.  I reached out to [facility] to get the patient rescheduled with one of the Mds.  I spoke with Jackelyn Poling the transportation coordinator and advised her that patient has been rescheduled to 02/20/22 with Dr. Linus Salmons.  I also spoke with the nurse and requested that she fax over all labs for the patient since he has been receiving IV antibiotics. They should be faxing labs over today.  Nolita Kutter T Brooks Sailors

## 2022-02-20 ENCOUNTER — Telehealth: Payer: Self-pay

## 2022-02-20 ENCOUNTER — Encounter: Payer: Self-pay | Admitting: Internal Medicine

## 2022-02-20 ENCOUNTER — Ambulatory Visit (INDEPENDENT_AMBULATORY_CARE_PROVIDER_SITE_OTHER): Payer: Medicare HMO | Admitting: Internal Medicine

## 2022-02-20 ENCOUNTER — Other Ambulatory Visit: Payer: Self-pay

## 2022-02-20 VITALS — BP 109/72 | HR 58 | Temp 97.8°F

## 2022-02-20 DIAGNOSIS — M86172 Other acute osteomyelitis, left ankle and foot: Secondary | ICD-10-CM

## 2022-02-20 DIAGNOSIS — M86142 Other acute osteomyelitis, left hand: Secondary | ICD-10-CM

## 2022-02-20 NOTE — Telephone Encounter (Signed)
Thank you Erin.

## 2022-02-20 NOTE — Assessment & Plan Note (Signed)
He is doing well overall and wound seems to be healing appropriately.  CRP has trended down to near normal and no issues on labs.  At this time, the current infection appears healed and will stop antibiotics and have picc line pulled by the facility.   He otherwise can follow up as needed.  I have personally spent 40 minutes involved in face-to-face and non-face-to-face activities for this patient on the day of the visit. Professional time spent includes the following activities: Preparing to see the patient (review of tests), Obtaining and/or reviewing separately obtained history (admission/discharge record), Performing a medically appropriate examination and/or evaluation , Ordering medications/tests/procedures, referring and communicating with other health care professionals, Documenting clinical information in the EMR, Independently interpreting results (not separately reported), Communicating results to the patient/family/caregiver, Counseling and educating the patient/family/caregiver and Care coordination (not separately reported).

## 2022-02-20 NOTE — Progress Notes (Signed)
   Subjective:    Patient ID: [name], male    DOB: 08-Dec-1955, 66 y.o.   MRN: [id number]  HPI He is here for follow up of osteomyelitis.   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  ESBL E coli grew in culture from resected bone with good gross margins.  He was seen in follow up by Dr. Gale Journey and he was concerned with osteomyelitis and started on ertapenem and oral doxycycline for 6 weeks. Picc line placed on 01/15/22, over 5 weeks ago.  He has continued to follow with podiatry and has a wound vac at the site.  Good granulation tissue reported and no exposed bone.  No issues with rash or diarrhea.  Extensive history reviewed.  See previous note for details.     Review of Systems  Constitutional:  Negative for fatigue.  Gastrointestinal:  Negative for diarrhea.  Skin:  Negative for rash.       Objective:   Physical Exam Eyes:     General: No scleral icterus. Pulmonary:     Effort: Pulmonary effort is normal.  Skin:    Findings: No rash.  Neurological:     Mental Status: He is alert.   SH: no tobacco presently        Assessment & Plan:

## 2022-02-20 NOTE — Telephone Encounter (Signed)
[place] [phone]) and spoke with Percell Locus, [place]. Reviewed Report of Consultation sent with patient. Per Dr. Henreitta Leber written orders, communicated to stop antibiotics and pull PICC line.  Percell Locus verbalized understanding, and requested if PICC could be pulled at [facility]. Upon review of patient's chart and discussion with Percell Locus, PICC was placed at patient's facility and Percell Locus was unable to obtain documentation of the length of the PICC line. Per discussion with [facility] supervisors and provider, this RN told Percell Locus that [facility] staff were unable to pull PICC without knowing PICC length (per [facility] protocol). Percell Locus stated understanding.  Binnie Kand, RN

## 2022-02-27 ENCOUNTER — Ambulatory Visit: Payer: Medicare HMO | Admitting: Family Medicine

## 2022-03-04 ENCOUNTER — Telehealth: Payer: Self-pay | Admitting: *Deleted

## 2022-03-04 NOTE — Telephone Encounter (Signed)
Nurse with [facility] just received patient today from [facility] rehab, has a wound vac but it belongs them, has to returned. So they will need a wound vac ,updated wound care /dressing changing orders.has an upcoming appointment at office 09/08.  Please advise.

## 2022-03-05 NOTE — Telephone Encounter (Signed)
Recently released from [facility].

## 2022-03-05 NOTE — Telephone Encounter (Signed)
Yes, [facility]

## 2022-03-12 ENCOUNTER — Telehealth: Payer: Self-pay | Admitting: *Deleted

## 2022-03-12 NOTE — Telephone Encounter (Signed)
Returned call to [employer], giving verbal orders, no answer , left voice message for approval of the orders per physician. Is the weight bearing status partial for both feet? Please advise.

## 2022-03-12 NOTE — Telephone Encounter (Signed)
Raelyn Mora PT is requesting verbal orders for 1 times wkly for 4 weeks, functional mobility, focus on transfers(struggling w/ transfers) clarify the weight bearing status for both feet lower extremities, please advise.

## 2022-03-13 ENCOUNTER — Ambulatory Visit: Payer: Medicare HMO | Admitting: Podiatry

## 2022-03-13 DIAGNOSIS — E1142 Type 2 diabetes mellitus with diabetic polyneuropathy: Secondary | ICD-10-CM

## 2022-03-13 DIAGNOSIS — Z9889 Other specified postprocedural states: Secondary | ICD-10-CM

## 2022-03-13 NOTE — Telephone Encounter (Signed)
[name] has been notified

## 2022-03-13 NOTE — Telephone Encounter (Signed)
Left a voice message of orders

## 2022-03-13 NOTE — Progress Notes (Signed)
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 male returns for post-op check.  Patient states he is doing okay.  Discharged from the facility.  Were working on getting him set up with wound VAC.  For now local wound care.  Review of Systems: Negative except as noted in the HPI. Denies N/V/F/Ch.  Past Medical History:  Diagnosis Date   Diabetes mellitus with peripheral vascular disease    Hypertension    Neuropathy    PTSD (post-traumatic stress disorder)    Sleep apnea     Current Outpatient Medications:    albuterol (VENTOLIN HFA) 108 (90 Base) MCG/ACT inhaler, Inhale 2 puffs into the lungs every 6 (six) hours as needed for wheezing or shortness of breath., Disp: 1 each, Rfl: 0   amLODipine (NORVASC) 5 MG tablet, Take 1 tablet (5 mg total) by mouth every morning., Disp: 30 tablet, Rfl: 0   aspirin EC 81 MG tablet, Take 1 tablet (81 mg total) by mouth daily. Swallow whole., Disp: 150 tablet, Rfl: 2   atorvastatin (LIPITOR) 80 MG tablet, Take 1 tablet (80 mg total) by mouth at bedtime., Disp: 30 tablet, Rfl: 0   buPROPion (WELLBUTRIN XL) 300 MG 24 hr tablet, Take 1 tablet (300 mg total) by mouth every morning., Disp: 30 tablet, Rfl: 0   cholecalciferol (VITAMIN D) 25 MCG (1000 UNIT) tablet, Take 1,000 Units by mouth daily., Disp: , Rfl:    clopidogrel (PLAVIX) 75 MG tablet, Take 1 tablet (75 mg total) by mouth daily., Disp: 30 tablet, Rfl: 11   empagliflozin (JARDIANCE) 25 MG TABS tablet, Take 1 tablet (25 mg total) by mouth daily., Disp: 30 tablet, Rfl: 0   feeding supplement (ENSURE ENLIVE / ENSURE PLUS) LIQD, Take 237 mLs by mouth 2 (two) times daily between meals., Disp: 14220 mL, Rfl: 2   fluticasone furoate-vilanterol (BREO ELLIPTA) 100-25 MCG/INH AEPB, Inhale 1 puff  into the lungs daily., Disp: 60 each, Rfl: 5   gabapentin (NEURONTIN) 600 MG tablet, Take 600 mg by mouth 2 (two) times daily., Disp: , Rfl:    insulin aspart (NOVOLOG) 100 UNIT/ML injection, CBG 70 - 120: 0 units CBG 121 - 150: 1 unit CBG 151 - 200: 2 units CBG 201 - 250: 3 units CBG 251 - 300: 5 units CBG 301 - 350: 7 units CBG 351 - 400: 9 units, Disp: 10 mL, Rfl: 11   insulin glargine-yfgn (SEMGLEE) 100 UNIT/ML injection, Inject 0.2 mLs (20 Units total) into the skin 2 (two) times daily., Disp: 10 mL, Rfl: 11   lisinopril (ZESTRIL) 20 MG tablet, Take 20 mg by mouth daily., Disp: , Rfl:    lurasidone (LATUDA) 40 MG TABS tablet, Take 1 tablet (40 mg total) by mouth daily with supper., Disp: 30 tablet, Rfl: 0   meclizine (ANTIVERT) 25 MG tablet, Take 1 tablet (25 mg total) by mouth 3 (three) times daily as needed for dizziness., Disp: 30 tablet, Rfl: 0   metoprolol tartrate (LOPRESSOR) 50 MG tablet, Take 1 tablet (50 mg total) by mouth 2 (two) times daily., Disp: 60 tablet, Rfl: 0   OLANZapine (ZYPREXA) 2.5 MG tablet, Take 1 tablet (2.5 mg total) by mouth at bedtime., Disp: 30 tablet, Rfl: 0   omeprazole (PRILOSEC) 20 MG capsule, Take  1 capsule (20 mg total) by mouth at bedtime., Disp: 30 capsule, Rfl: 0   OXcarbazepine (TRILEPTAL) 150 MG tablet, Take 1 tablet (150 mg total) by mouth 2 (two) times daily., Disp: 60 tablet, Rfl: 0   senna-docusate (SENOKOT-S) 8.6-50 MG tablet, Take 1 tablet by mouth at bedtime as needed for mild constipation., Disp: , Rfl:   Social History   Tobacco Use  Smoking Status Former   Types: Cigarettes   Passive exposure: Never  Smokeless Tobacco Never    Allergies  Allergen Reactions   Gemfibrozil Other (See Comments)    Doesn't know its been awhile   Oxycontin [Oxycodone Hcl] Other (See Comments)    Bad reaction "took me to a place I never wanna go again"   Pork-Derived Products    Simvastatin Other (See Comments)    Says he doesn't know its been awhile    Objective:  There were no vitals filed for this visit. There is no height or weight on file to calculate BMI. Constitutional Well developed. Well nourished.  Vascular Foot warm and well perfused. Capillary refill normal to all digits.   Neurologic Normal speech. Oriented to person, place, and time. Epicritic sensation to light touch grossly present bilaterally.  Dermatologic Left partial fifth ray wound healing well.  Granular wound bed noted no exposure of bone noted. No malodor   Orthopedic: Tenderness to palpation noted about the surgical site.   Radiographs: None Assessment:   1. Post-operative state   2. Type 2 diabetes mellitus with peripheral neuropathy (HCC)      Plan:  Patient was evaluated and treated and all questions answered.  S/p foot surgery left -Progressing as expected post-operatively. -XR: See above -WB Status: Partial weightbearing to the heel in surgical shoe -Waiting on wound VAC approval through [facility] Medicare -Medications: None -Hallux wound has reepithelialized.  No signs of Deis is noted. -Please reapply the [facility] as soon as he gets the wound VAC.  He has been discharged from the nursing facility  Left lateral foot wound -The wound is measuring 5 cm x 2.5 cm x 0.3 cm.  It is granulation wound bed no signs of infection.  Patient will benefit from negative pressure wound therapy once approved.  For now local wound care with Dakin's and Betadine wet-to-dry. -Minimal debridement was performed in standard technique  No follow-ups on file.

## 2022-03-13 NOTE — Telephone Encounter (Signed)
Called giving verbal orders per physician

## 2022-03-16 NOTE — Telephone Encounter (Signed)
Progression , please clarify,advise.

## 2022-03-16 NOTE — Telephone Encounter (Signed)
PT(Hoffman) is calling for clarification on the WB status for the right heel as well as left, notes from epic specifies partial WB on b/l heels but this can be difficult to do for the patient.Please advise.

## 2022-03-18 ENCOUNTER — Telehealth: Payer: Self-pay | Admitting: Podiatry

## 2022-03-18 ENCOUNTER — Telehealth: Payer: Self-pay | Admitting: *Deleted

## 2022-03-18 NOTE — Telephone Encounter (Signed)
Herbert Deaner the Physical therapist @ [facility] asking for an update orders on pt is he weight bearing on both feet and does he need to continue wearing the boot. He was returning a call to Ammie.  Please call him back at [phone]

## 2022-03-18 NOTE — Telephone Encounter (Signed)
Roque Cash OT, calling to because patient's wound left lateral foot, proximal end that is a soft yellow fluff. Is there anything else to do besides wet to dry. They are still waiting on the approval of wound vac. Please advise.

## 2022-03-18 NOTE — Telephone Encounter (Signed)
Called and spoke w/ Alen Blew) giving verbal orders thru voice message for WB status: Partial weightbearing to B/ l heels in surgical shoes as tolerated per physician

## 2022-03-26 NOTE — Telephone Encounter (Signed)
Spoke with Lealea, OT-[facility],wound vac came in and they have put it on yesterday.

## 2022-03-30 ENCOUNTER — Telehealth: Payer: Self-pay | Admitting: *Deleted

## 2022-03-30 NOTE — Telephone Encounter (Signed)
Lele with [facility] is calling because she went out for a visit w/ patient today, foot is not looking good, skin around wound is macerated, wet, another small wound has opened up and has a yellow fluff. She did not put wound vac back on until she hears back from physician Will be going back out on Wednesday. Patient stated that he was in pain over weekend but better today.  Please advise.

## 2022-03-31 NOTE — Telephone Encounter (Signed)
Spoke with Lele, giving instructions per physician's orders, verbalized understanding, has an upcoming F/U appointment on the 29 th.

## 2022-04-01 ENCOUNTER — Telehealth: Payer: Self-pay | Admitting: Podiatry

## 2022-04-01 NOTE — Telephone Encounter (Signed)
[name] with [facility] called  -  patient is at partial weight bearing BIL heel in surgical shoe - patient is struggling with maintaining weight bearing with transfer and ambulation .  Currently they are discharging home health PT he is at maximum potential  , when he is weight bearing BIL  he will be more appropriate for therapy , but not at this time.

## 2022-04-03 ENCOUNTER — Ambulatory Visit (INDEPENDENT_AMBULATORY_CARE_PROVIDER_SITE_OTHER): Payer: Medicare HMO | Admitting: Podiatry

## 2022-04-03 ENCOUNTER — Encounter: Payer: Self-pay | Admitting: Podiatry

## 2022-04-03 DIAGNOSIS — E1142 Type 2 diabetes mellitus with diabetic polyneuropathy: Secondary | ICD-10-CM

## 2022-04-03 DIAGNOSIS — Z89422 Acquired absence of other left toe(s): Secondary | ICD-10-CM

## 2022-04-03 NOTE — Progress Notes (Signed)
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 male returns for post-op check.  Patient states he is doing okay.  Discharged from the facility.  Were working on getting him set up with wound VAC.  For now local wound care.  Review of Systems: Negative except as noted in the HPI. Denies N/V/F/Ch.  Past Medical History:  Diagnosis Date   Diabetes mellitus with peripheral vascular disease    Hypertension    Neuropathy    PTSD (post-traumatic stress disorder)    Sleep apnea     Current Outpatient Medications:    albuterol (VENTOLIN HFA) 108 (90 Base) MCG/ACT inhaler, Inhale 2 puffs into the lungs every 6 (six) hours as needed for wheezing or shortness of breath., Disp: 1 each, Rfl: 0   amLODipine (NORVASC) 5 MG tablet, Take 1 tablet (5 mg total) by mouth every morning., Disp: 30 tablet, Rfl: 0   aspirin EC 81 MG tablet, Take 1 tablet (81 mg total) by mouth daily. Swallow whole., Disp: 150 tablet, Rfl: 2   atorvastatin (LIPITOR) 80 MG tablet, Take 1 tablet (80 mg total) by mouth at bedtime., Disp: 30 tablet, Rfl: 0   buPROPion (WELLBUTRIN XL) 300 MG 24 hr tablet, Take 1 tablet (300 mg total) by mouth every morning., Disp: 30 tablet, Rfl: 0   cholecalciferol (VITAMIN D) 25 MCG (1000 UNIT) tablet, Take 1,000 Units by mouth daily., Disp: , Rfl:    clopidogrel (PLAVIX) 75 MG tablet, Take 1 tablet (75 mg total) by mouth daily., Disp: 30 tablet, Rfl: 11   empagliflozin (JARDIANCE) 25 MG TABS tablet, Take 1 tablet (25 mg total) by mouth daily., Disp: 30 tablet, Rfl: 0   fluticasone furoate-vilanterol (BREO ELLIPTA) 100-25 MCG/INH AEPB, Inhale 1 puff into the lungs daily., Disp: 60 each, Rfl: 5   gabapentin (NEURONTIN) 600 MG tablet, Take 600 mg by mouth 2 (two) times daily.,  Disp: , Rfl:    insulin aspart (NOVOLOG) 100 UNIT/ML injection, CBG 70 - 120: 0 units CBG 121 - 150: 1 unit CBG 151 - 200: 2 units CBG 201 - 250: 3 units CBG 251 - 300: 5 units CBG 301 - 350: 7 units CBG 351 - 400: 9 units, Disp: 10 mL, Rfl: 11   insulin glargine-yfgn (SEMGLEE) 100 UNIT/ML injection, Inject 0.2 mLs (20 Units total) into the skin 2 (two) times daily., Disp: 10 mL, Rfl: 11   lisinopril (ZESTRIL) 20 MG tablet, Take 20 mg by mouth daily., Disp: , Rfl:    lurasidone (LATUDA) 40 MG TABS tablet, Take 1 tablet (40 mg total) by mouth daily with supper., Disp: 30 tablet, Rfl: 0   meclizine (ANTIVERT) 25 MG tablet, Take 1 tablet (25 mg total) by mouth 3 (three) times daily as needed for dizziness., Disp: 30 tablet, Rfl: 0   metoprolol tartrate (LOPRESSOR) 50 MG tablet, Take 1 tablet (50 mg total) by mouth 2 (two) times daily., Disp: 60 tablet, Rfl: 0   OLANZapine (ZYPREXA) 2.5 MG tablet, Take 1 tablet (2.5 mg total) by mouth at bedtime., Disp: 30 tablet, Rfl: 0   omeprazole (PRILOSEC) 20 MG capsule, Take 1 capsule (20 mg total) by mouth at bedtime., Disp: 30 capsule, Rfl: 0   OXcarbazepine (TRILEPTAL) 150 MG tablet, Take 1  tablet (150 mg total) by mouth 2 (two) times daily., Disp: 60 tablet, Rfl: 0   senna-docusate (SENOKOT-S) 8.6-50 MG tablet, Take 1 tablet by mouth at bedtime as needed for mild constipation., Disp: , Rfl:   Social History   Tobacco Use  Smoking Status Former   Types: Cigarettes   Passive exposure: Never  Smokeless Tobacco Never    Allergies  Allergen Reactions   Gemfibrozil Other (See Comments)    Doesn't know its been awhile   Oxycontin [Oxycodone Hcl] Other (See Comments)    Bad reaction "took me to a place I never wanna go again"   Pork-Derived Products    Simvastatin Other (See Comments)    Says he doesn't know its been awhile   Objective:  There were no vitals filed for this visit. There is no height or weight on file to calculate BMI. Constitutional  Well developed. Well nourished.  Vascular Foot warm and well perfused. Capillary refill normal to all digits.   Neurologic Normal speech. Oriented to person, place, and time. Epicritic sensation to light touch grossly present bilaterally.  Dermatologic Left partial fifth ray wound healing well.  Granular wound bed noted no exposure of bone noted. No malodor   Orthopedic: Tenderness to palpation noted about the surgical site.   Radiographs: None Assessment:   1. 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 Type 2 diabetes mellitus with peripheral neuropathy (HCC)      Plan:  Patient was evaluated and treated and all questions answered.  S/p foot surgery left -Progressing as expected post-operatively. -XR: See above -WB Status: Partial weightbearing to the heel in surgical shoe -Medications: None -Hallux wound has reepithelialized.  No signs of Deis is noted. -He has not been able to set up for [facility] on an outpatient setting.  Left lateral foot wound -The wound is measuring 5 cm x 2.5 cm x 0.3 cm.  It is granulation wound bed no signs of infection.  Patient will benefit from negative pressure wound therapy once approved.  For now local wound care with Dakin's and Betadine wet-to-dry. -Minimal debridement was performed in standard technique -Patient will be referred to the wound care center for advanced treatment  No follow-ups on file.  Looking same.  Referred to the wound care center.  Continue Betadine wet-to-dry

## 2022-04-06 ENCOUNTER — Telehealth: Payer: Self-pay | Admitting: *Deleted

## 2022-04-06 NOTE — Telephone Encounter (Signed)
Faxed referral for [facility], for appointment, patient has been notified and their number given.

## 2022-04-06 NOTE — Telephone Encounter (Signed)
-----   Message from [name], DPM sent at 04/03/2022 12:42 PM EDT ----- Regarding: Follow-up at the wound care center Hi [name],  Can you have this patient follow-up at the wound care center I put the order in

## 2022-04-14 ENCOUNTER — Other Ambulatory Visit: Payer: Self-pay

## 2022-04-14 ENCOUNTER — Encounter (HOSPITAL_BASED_OUTPATIENT_CLINIC_OR_DEPARTMENT_OTHER): Payer: Medicare HMO | Attending: Internal Medicine | Admitting: Internal Medicine

## 2022-04-14 DIAGNOSIS — Z89422 Acquired absence of other left toe(s): Secondary | ICD-10-CM | POA: Diagnosis not present

## 2022-04-14 DIAGNOSIS — E1151 Type 2 diabetes mellitus with diabetic peripheral angiopathy without gangrene: Secondary | ICD-10-CM | POA: Insufficient documentation

## 2022-04-14 DIAGNOSIS — E1142 Type 2 diabetes mellitus with diabetic polyneuropathy: Secondary | ICD-10-CM

## 2022-04-14 DIAGNOSIS — L97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 specified severity: Secondary | ICD-10-CM

## 2022-04-14 DIAGNOSIS — Z8619 Personal history of other infectious and parasitic diseases: Secondary | ICD-10-CM | POA: Diagnosis not present

## 2022-04-14 DIAGNOSIS — G4733 Obstructive sleep apnea (adult) (pediatric): Secondary | ICD-10-CM | POA: Insufficient documentation

## 2022-04-14 DIAGNOSIS — Z794 Long term (current) use of insulin: Secondary | ICD-10-CM | POA: Insufficient documentation

## 2022-04-14 DIAGNOSIS — I10 Essential (primary) hypertension: Secondary | ICD-10-CM | POA: Insufficient documentation

## 2022-04-14 DIAGNOSIS — L97522 Non-pressure chronic ulcer of other part of left foot with fat layer exposed: Secondary | ICD-10-CM | POA: Diagnosis not present

## 2022-04-14 DIAGNOSIS — Z87891 Personal history of nicotine dependence: Secondary | ICD-10-CM | POA: Insufficient documentation

## 2022-04-14 DIAGNOSIS — E11621 Type 2 diabetes mellitus with foot ulcer: Secondary | ICD-10-CM | POA: Diagnosis present

## 2022-04-14 NOTE — Progress Notes (Signed)
[name] [name], [name] ([id number]) [id number] Page 1 of 13 Visit Report for 04/14/2022 Chief Complaint Document Details Patient Name: Date of Service: [name] [name], [place] [name]. 04/14/2022 9:45 [name] Medical Record Number: [id number] Patient Account Number: [id number] Date of Birth/Sex: Treating RN: January 07, 1956 (66 y.o. M) Primary Care Provider: York Ram Other Clinician: Referring Provider: Treating Provider/Extender: Seward Grater in Treatment: 0 Information Obtained from: Patient Chief Complaint 10/16/20; patient is here with [name] substantial wound on his right foot in the setting of [name] recent transmetatarsal amputation. 04/14/2022; referral from podiatry for postsurgical wound of nonhealing partial fifth ray amputation Electronic SignatureZamoras) Signed: 04/14/2022 11:37:20 AM By: [name] DO Entered By: [name] on 04/14/2022 11:18:54 -------------------------------------------------------------------------------- Debridement Details Patient Name: Date of Service: [name] [name], NA [name] [name]. 04/14/2022 9:45 [name] Medical Record Number: [id number] Patient Account Number: [id number] Date of Birth/Sex: Treating RN: 1955-11-07 (66 y.o. [name] [name] Primary Care Provider: York Ram Other Clinician: Referring Provider: Treating Provider/Extender: Seward Grater in Treatment: 0 Debridement Performed for Assessment: Wound #4 Left,Dorsal Foot Performed By: Physician [name], DO Debridement Type: Debridement Severity of Tissue Pre Debridement: Fat layer exposed Level of Consciousness (Pre-procedure): Awake and Alert Pre-procedure Verification/Time Out Yes - 10:15 Taken: Start Time: 10:16 Pain Control: Lidocaine 5% topical ointment T Area Debrided (L x W): otal 3 (cm) x 1 (cm) = 3 (cm) Tissue and other material debrided: Viable, Non-Viable, Eschar, Slough, Subcutaneous, Skin: Dermis , Skin:  Epidermis, Slough Level: Skin/Subcutaneous Tissue Debridement Description: Excisional Instrument: Curette Bleeding: Minimum Hemostasis Achieved: Pressure End Time: 10:20 Procedural Pain: 0 Post Procedural Pain: 0 Response to Treatment: Procedure was tolerated well Level of Consciousness (Post- Awake and Alert procedure): Post Debridement Measurements of Total Wound Length: (cm) 3 Width: (cm) 1 Depth: (cm) 0.1 Volume: (cm) 0.236 Character of Wound/Ulcer Post Debridement: Requires Further Debridement [name] [name], [name] ([id number]) [id number] Page 2 of 13 Severity of Tissue Post Debridement: Fat layer exposed Post Procedure Diagnosis Same as Pre-procedure Electronic SignatureZamoras) Signed: 04/14/2022 11:37:20 AM By: [name] DO Signed: 04/14/2022 6:12:04 PM By: [name] RN, [name] Entered By: [name] on 04/14/2022 10:20:24 -------------------------------------------------------------------------------- Debridement Details Patient Name: Date of Service: [name] [name], NA [name] [name]. 04/14/2022 9:45 [name] Medical Record Number: [id number] Patient Account Number: [id number] Date of Birth/Sex: Treating RN: December 27, 1955 (66 y.o. [name] [name] Primary Care Provider: York Ram Other Clinician: Referring Provider: Treating Provider/Extender: Seward Grater in Treatment: 0 Debridement Performed for Assessment: Wound #5 Left,Lateral Foot Performed By: Physician [name], DO Debridement Type: Debridement Severity of Tissue Pre Debridement: Necrosis of bone Level of Consciousness (Pre-procedure): Awake and Alert Pre-procedure Verification/Time Out Yes - 10:15 Taken: Start Time: 10:16 Pain Control: Lidocaine 5% topical ointment T Area Debrided (L x W): otal 6.7 (cm) x 1.9 (cm) = 12.73 (cm) Tissue and other material debrided: Viable, Non-Viable, Bone, Eschar, Slough, Subcutaneous, Skin: Dermis , Skin: Epidermis,  Slough Level: Skin/Subcutaneous Tissue/Muscle/Bone Debridement Description: Excisional Instrument: Curette Specimen: Swab, Number of Specimens T aken: 2 Bleeding: Minimum Hemostasis Achieved: Pressure End Time: 10:20 Procedural Pain: 0 Post Procedural Pain: 0 Response to Treatment: Procedure was tolerated well Level of Consciousness (Post- Awake and Alert procedure): Post Debridement Measurements of Total Wound Length: (cm) 6.7 Width: (cm) 1.9 Depth: (cm) 0.5 Volume: (cm) 4.999 Character of Wound/Ulcer Post Debridement: Requires Further Debridement Severity of Tissue Post Debridement: Necrosis of bone Post Procedure Diagnosis Same as Pre-procedure Electronic SignatureZamoras) Signed: 04/14/2022  11:37:20 AM By: [name] DO Signed: 04/14/2022 6:12:04 PM By: [name] RN, [name] Entered By: [name] on 04/14/2022 10:35:51 [name] [name] ([id number]) [id number] Page 3 of 13 -------------------------------------------------------------------------------- HPI Details Patient Name: Date of Service: [name] [name], [place] [name] [name]. 04/14/2022 9:45 [name] Medical Record Number: [id number] Patient Account Number: [id number] Date of Birth/Sex: Treating RN: May 06, 1956 (66 y.o. M) Primary Care Provider: York Ram Other Clinician: Referring Provider: Treating Provider/Extender: Seward Grater in Treatment: 0 History of Present Illness HPI Description: ADMISSION 10/16/20 This is [name] 66 year old man who is [name] type II diabetic. Initially seen by Dr. Unk Lightning of vein and vascular on 09/10/2021 for bilateral lower extremity rest pain right greater than left. His ABIs demonstrated monophasic waveforms at the ankles bilaterally with depressed toe pressures. His ABI in the right was 0.5 and on the left 0.28. There were no obtainable waveforms for TBI's. He underwent angiography on 09/10/2021 and had findings of severe PAD with 95% stenosis of  the distal SFA. He also had peroneal and posterior tibial arteries occluded with significant collateralization in the calf there was reconstitution of the posterior tibial artery in the distal third of the calf. Ostia of the anterior tibial artery was not appreciated. He underwent [name] angioplasty of the superficial femoral artery. He required admission to hospital from 09/12/2021 through 09/22/2021 with right foot cellulitis and sepsis. On 3/13 he underwent [name] right below-knee popliteal to posterior tibial bypass using [name] greater saphenous vein. Ultimately however he required [name] right TMA by podiatry which I believe was done on 3/17 for progressive diabetic foot infection. He was discharged to [facility] skilled facility. He arrives in clinic today with [name] large wound area over the entirety of his amputation site extending proximally. The attempted flap closure of the amputation site and his TMA has failed and the wound extends under the attempt to closure. Still some sutures in place. There is an odor but he is not systemically unwell. He is due for follow-up arterial studies and has an appointment with vascular surgery on 10/28/2021. They are using wet-to-dry dressings at [facility]. Past medical history includes type 2 diabetes with peripheral neuropathy, hypertension, obstructive sleep apnea 4/20; patient presents for follow-up. He is being discharged from his facility at [facility] today. They have been doing Dakin's wet-to-dry dressings. He states that his fiance can help with dressing changes. He is getting [facility] to come out 3 times [name] week once he leaves the facility. He is scheduled to see vein and vascular on 5/12. He has not seen the wound himself. He puts covers over his face for wound exams. He currently denies systemic signs of infection. 4/28; patient admitted to the clinic 2 weeks ago with [name] dehisced right TMA in the setting of type 2 diabetes with significant PAD. He is status  post revascularization. He has been discharged from the nursing home he was at and now is at home using Dakin's wet-to-dry dressings daily he has home health. He denies any systemic signs of infection 5/4; patient presents for follow-up. His fiance and home health have been changing his dressings. He currently denies systemic signs of infection. He continues to put sheet covers over his face during our wound encounters because he does not want [name] look at the wound. 5/25; Patient presents for follow-up. He missed his last clinic appointment. He had [name] bone biopsy and culture done at last clinic visit that showed acute osteomyelitis with growth of E. coli, stenotrophomonas  maltophilia and enteroccocus faecalis. [name] referral to infectious disease was made. He has not heard from them yet. He has been using Dakin's wet-to-dry dressings. He is now more comfortable with looking at the wound bed. 6/6; the patient comes into clinic today with 2 wounds on the left foot which apparently have been there for several months. This was not known to assess but was known by Dr. Donzetta Matters. In fact he underwent an angiogram yesterday. He had [name] laser atherectomy of the left posterior tibial artery with [name] 3 mm balloon angioplasty, also [name] stent of the last left SFA. 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 This is [name] lot better than the last time I saw it. He has been using Dakin's wet-to-dry. He has an appointment with infectious disease for [name] bone biopsy which showed E. coli, stenotrophomonas and Enterococcus faecalis. The appointment is for tomorrow 04/14/2022 Mr. [name] [name] is an uncontrolled type 2 diabetic on insulin that presents with [name] left foot ulcer. 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 He had revision of this site on 6/17 by Dr. Posey Pronto,  podiatry. He states he has had [name] wound VAC on this area for the past 3 weeks. It is unclear what the prior wound care has been. He has [name] history of osteomyelitis to this area and was treated with IV and oral antibiotics For 6 weeks by Dr. Linus Salmons of infectious disease. At his last follow-up on 02/20/2022 his CRP was trending down and antibiotics were stopped. He also has [name] history of peripheral arterial disease status post stenting to the left SFA and balloon angioplasty to the left posterior tibial artery On 12/2021. He has follow-up with vein and vascular at the end of the month. He also has [name] wound to the dorsal aspect of the left foot that he has been placing Betadine on. Electronic SignatureZamoras) Signed: 04/14/2022 11:37:20 AM By: [name] DO Entered By: [name] on 04/14/2022 11:30:13 -------------------------------------------------------------------------------- Physical Exam Details Patient Name: Date of Service: [name] [name], NA [name] [name]. 04/14/2022 9:45 [name] [name] [name], [name] [name] ([id number]) [id number] Page 4 of 13 Medical Record Number: [id number] Patient Account Number: [id number] Date of Birth/Sex: Treating RN: May 24, 1956 (66 y.o. M) Primary Care Provider: York Ram Other Clinician: Referring Provider: Treating Provider/Extender: Seward Grater in Treatment: 0 Constitutional respirations regular, non-labored and within target range for patient.. Cardiovascular 2+ dorsalis pedis/posterior tibialis pulses. Psychiatric pleasant and cooperative. Notes Left foot: T the lateral aspect there is an open wound with granulation tissue and nonviable tissue and exposed bone. No signs of surrounding soft tissue o infection. T the dorsal aspect there is an open wound with nonviable tissue throughout. o Electronic SignatureZamoras) Signed: 04/14/2022 11:37:20 AM By: [name] DO Entered By: [name] on 04/14/2022  11:30:43 -------------------------------------------------------------------------------- Physician Orders Details Patient Name: Date of Service: [name] [name], NA [name] [name]. 04/14/2022 9:45 [name] Medical Record Number: [id number] Patient Account Number: [id number] Date of Birth/Sex: Treating RN: July 12, 1955 (66 y.o. [name] [name] Primary Care Provider: York Ram Other Clinician: Referring Provider: Treating Provider/Extender: Seward Grater in Treatment: 0 Verbal / Phone Orders: No Diagnosis Coding ICD-10 Coding Code Description E11.621 Type 2 diabetes mellitus with foot ulcer Follow-up Appointments ppointment in 1 week. - Dr. Heber Tivoli and  Bobbi, Room 8 Thursday Return [name] Anesthetic (In clinic) Topical Lidocaine 5% applied to wound bed Bathing/ Shower/ Hygiene May shower with protection but do not get wound dressingZamoras) wet. Negative Presssure Wound Therapy Other: - HOLD WOUND VAC UNTIL BONE BIOPSY AND CULTURE TO RETURNS. Edema Control - Lymphedema / SCD / Other Elevate legs to the level of the heart or above for 30 minutes daily and/or when sitting, [name] frequency of: - 3-4 times [name] day throughout the day. Avoid standing for long periods of time. Off-Loading Open toe surgical shoe to: - wear when standing or walking. Home Health New wound care orders this week; continue Home Health for wound care. May utilize formulary equivalent dressing for wound treatment orders unless otherwise specified. - dakins wet to dry to left lateral foot. medihoney and hydrofera blue to dorsal foot. Home health x3 times [name] week, wound center weekly, and all other day family to change. Dressing changes to be completed by [facility] on Monday / Wednesday / Friday except when patient has scheduled visit at [facility]. Other Home Health Orders/Instructions: - [facility] home [facility]. Wound Treatment [name] [name], [name] [name] ([id number]) [id number] Page 5 of  13 Wound #4 - Foot Wound Laterality: Dorsal, Left Cleanser: Wound Cleanser (Home Health) 1 x Per Day/30 Days Discharge Instructions: Cleanse the wound with wound cleanser prior to applying [name] clean dressing using gauze sponges, not tissue or cotton balls. [name]-Wound Care: Skin Prep (Home Health) 1 x Per Day/30 Days Discharge Instructions: Use skin prep as directed Prim Dressing: Hydrofera Blue Ready Foam, 4x5 in (Home Health) 1 x Per Day/30 Days ary Discharge Instructions: Apply to wound bed as instructed Prim Dressing: MediHoney Gel, tube 1.5 (oz) ([place]) 1 x Per Day/30 Days ary Discharge Instructions: Apply to wound bed as instructed Secondary Dressing: ABD Pad, 8x10 ([place]) 1 x Per Day/30 Days Discharge Instructions: Apply over primary dressing as directed. Secured With: [employer], 4.5x3.1 (in/yd) (Home Health) 1 x Per Day/30 Days Discharge Instructions: Secure with Kerlix as directed. Secured With: 60M Medipore H Soft Cloth Surgical T ape, 4 x 10 (in/yd) (Home Health) 1 x Per Day/30 Days Discharge Instructions: Secure with tape as directed. Wound #5 - Foot Wound Laterality: Left, Lateral Cleanser: Wound Cleanser (Home Health) 1 x Per Day/30 Days Discharge Instructions: Cleanse the wound with wound cleanser prior to applying [name] clean dressing using gauze sponges, not tissue or cotton balls. Prim Dressing: Dakin's Solution 0.25%, 16 (oz) (Home Health) 1 x Per Day/30 Days ary Discharge Instructions: Moisten gauze with Dakin's solution Secondary Dressing: ABD Pad, 8x10 (Home Health) 1 x Per Day/30 Days Discharge Instructions: Apply over primary dressing as directed. Secured With: [employer], 4.5x3.1 (in/yd) (Home Health) 1 x Per Day/30 Days Discharge Instructions: Secure with Kerlix as directed. Secured With: 60M Medipore H Soft Cloth Surgical T ape, 4 x 10 (in/yd) (Home Health) 1 x Per Day/30 Days Discharge Instructions: Secure with tape as  directed. Laboratory Bacteria identified in Tissue by Biopsy culture (MICRO) - Call you once the results return - (ICD10 E11.621 - Type 2 diabetes mellitus with foot ulcer) [name] ZamoraMICRO) - Bone culture PCR- will call you once results returns. - (ICD10 Bacteria identified in Unspecified specimen by [name] E11.621 - Type 2 diabetes mellitus with foot ulcer) [name] Code: 818-5 Convenience Name: Anaerobic culture Patient Medications llergies: gemfibrozil, OxyContin, pork derived (porcine), simvastatin [name] Notifications Medication Indication Start End 04/14/2022 lidocaine DOSE  topical 5 % gel - gel topical applied only in clinic for debridements. Electronic SignatureZamoras) Signed: 04/14/2022 11:37:20 AM By: [name] DO Entered By: [name] on 04/14/2022 11:30:51 [name] [name] ([id number]) [id number] Page 6 of 13 Prescription 04/14/2022 -------------------------------------------------------------------------------- [name] [name]. [name] DO Patient Name: Provider: 03-02-1956 [id number] Date of Birth: NPI#: Jerilynn Mages [id number] Sex: DEA #: [phone] Phone #: License #: [place] Patient Address: [place] [address] [place], [place] [address] [place], [place] [address] [phone] Allergies gemfibrozil; OxyContin; pork derived (porcine); simvastatin Provider's Orders Bacteria identified in Tissue by Biopsy culture - ICD10: E11.621 - Call you once the results return [name] Code: 20474-3 Convenience Name: Biopsy specimen culture Hand Signature: DateZamoras): Prescription 04/14/2022 [name] [name]. [name] DO Patient Name: Provider: Jun 12, 1956 [id number] Date of Birth: NPI#: Jerilynn Mages [id number] Sex: DEA #: [phone] Phone #: License #: [place] Patient Address: [place] [address] [place], [place] [address] [place],  [address] [phone] Allergies gemfibrozil; OxyContin; pork derived (porcine); simvastatin Provider's Orders naerobe culture - ICD10: E11.621 - Bone culture PCR- will call you once results returns. Bacteria identified in Unspecified specimen by [name] [name] Code: 196-2 Convenience Name: Anaerobic culture Hand Signature: DateZamoras): Electronic SignatureZamoras) Signed: 04/14/2022 11:37:20 AM By: [name] DO Entered By: [name] on 04/14/2022 11:30:52 -------------------------------------------------------------------------------- Problem List Details Patient Name: Date of Service: [name] [name], NA [name] [name]. 04/14/2022 9:45 [name] Medical Record Number: [id number] Patient Account Number: [id number] Date of Birth/Sex: Treating RN: [birth year] (66 y.o. [name] [name] Primary Care Provider: York Ram Other Clinician: Referring Provider: Treating Provider/Extender: Seward Grater in Treatment: 0 Active Problems TURNER, BAILLIE [name] ([id number]) [id number] Page 7 of 13 ICD-10 Encounter Code Description Active Date MDM Diagnosis L97.522 Non-pressure chronic ulcer of other part of left foot with fat layer exposed 04/14/2022 No Yes L97.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 specified 04/14/2022 No Yes severity E11.621 Type 2 diabetes mellitus with foot ulcer 04/14/2022 No Yes E11.42 Type 2 diabetes mellitus with diabetic polyneuropathy 04/14/2022 No Yes Inactive Problems Resolved Problems Electronic SignatureZamoras) Signed: 04/14/2022 11:37:20 AM By: [name] DO Entered By: [name] on 04/14/2022 11:06:43 -------------------------------------------------------------------------------- Progress Note Details Patient Name: Date of Service: [name] [name], NA [name] [name]. 04/14/2022 9:45 [name]  M Medical Record Number: [id number] Patient Account Number: [id number] Date of Birth/Sex: Treating RN: 04-17-1956 (66 y.o. M) Primary Care Provider: York Ram Other Clinician: Referring Provider: Treating Provider/Extender: Seward Grater in Treatment: 0 Subjective Chief Complaint Information obtained from Patient 10/16/20; patient is here with [name] substantial wound on his right foot in the setting of [name] recent transmetatarsal amputation. 04/14/2022; referral from podiatry for postsurgical wound of nonhealing partial fifth ray amputation History of Present Illness (HPI) ADMISSION 10/16/20 This is [name] 66 year old man who is [name] type II diabetic. Initially seen by Dr. Unk Lightning of vein and vascular on 09/10/2021 for bilateral lower extremity rest pain right greater than left. His ABIs demonstrated monophasic waveforms at the ankles bilaterally with depressed toe pressures. His ABI in the right was 0.5 and on the left 0.28. There were no obtainable waveforms for TBI's. He underwent angiography on 09/10/2021 and had findings of severe PAD with 95% stenosis of the distal SFA. He also had peroneal and posterior tibial arteries occluded with significant collateralization in the calf there  was reconstitution of the posterior tibial artery in the distal third of the calf. Ostia of the anterior tibial artery was not appreciated. He underwent [name] angioplasty of the superficial femoral artery. He required admission to hospital from 09/12/2021 through 09/22/2021 with right foot cellulitis and sepsis. On 3/13 he underwent [name] right below-knee popliteal to posterior tibial bypass using [name] greater saphenous vein. Ultimately however he required [name] right TMA by podiatry which I believe was done on 3/17 for progressive diabetic foot infection. He was discharged to [facility] skilled facility. He arrives in clinic today with [name] large wound area over the entirety of his amputation site extending proximally. The  attempted flap closure of the amputation site and his TMA has failed and the wound extends under the attempt to closure. Still some sutures in place. There is an odor but he is not systemically unwell. He is due for follow-up arterial studies and has an appointment with vascular surgery on 10/28/2021. They are using wet-to-dry dressings at [name]. Past medical history includes type 2 diabetes with peripheral neuropathy, hypertension, obstructive sleep apnea 4/20; patient presents for follow-up. He is being discharged from his facility at [facility] today. They have been doing Dakin's wet-to-dry dressings. He states that his fiance can help with dressing changes. He is getting [facility] home [facility] to come out 3 times [name] week once he leaves the facility. He is scheduled to see vein and vascular on 5/12. He has not seen the wound himself. He puts covers over his face for wound exams. He currently denies systemic signs of infection. 4/28; patient admitted to the clinic 2 weeks ago with [name] dehisced right TMA in the setting of type 2 diabetes with significant PAD. He is status post revascularization. He has been discharged from the nursing home he was at and now is at home using Dakin's wet-to-dry dressings daily he has home health. LEILAN, BOCHENEK [name] ([id number]) [id number] Page 8 of 13 He denies any systemic signs of infection 5/4; patient presents for follow-up. His fiance and home health have been changing his dressings. He currently denies systemic signs of infection. He continues to put sheet covers over his face during our wound encounters because he does not want [name] look at the wound. 5/25; Patient presents for follow-up. He missed his last clinic appointment. He had [name] bone biopsy and culture done at last clinic visit that showed acute osteomyelitis with growth of E. coli, stenotrophomonas maltophilia and enteroccocus faecalis. [name] referral to infectious disease was made.  He has not heard from them yet. He has been using Dakin's wet-to-dry dressings. He is now more comfortable with looking at the wound bed. 6/6; the patient comes into clinic today with 2 wounds on the left foot which apparently have been there for several months. This was not known to assess but was known by Dr. Donzetta Matters. In fact he underwent an angiogram yesterday. He had [name] laser atherectomy of the left posterior tibial artery with [name] 3 mm balloon angioplasty, also [name] stent of the last left SFA. 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 This is [name] lot better than the last time I saw it. He has been using Dakin's wet-to-dry. He has an appointment with infectious disease for [name] bone biopsy which showed E. coli, stenotrophomonas and Enterococcus faecalis. The appointment is for  tomorrow 04/14/2022 [name] [name] is an uncontrolled type 2 diabetic on insulin that presents with [name] left foot ulcer. 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 He had revision of this site on 6/17 by Dr. Posey Pronto, podiatry. He states he has had [name] wound VAC on this area for the past 3 weeks. It is unclear what the prior wound care has been. He has [name] history of osteomyelitis to this area and was treated with IV and oral antibiotics For 6 weeks by Dr. Linus Salmons of infectious disease. At his last follow-up on 02/20/2022 his CRP was trending down and antibiotics were stopped. He also has [name] history of peripheral arterial disease status post stenting to the left SFA and balloon angioplasty to the left posterior tibial artery On 12/2021. He has follow-up with vein and vascular at the end of the month. He also has [name] wound to the dorsal aspect of the left foot that he has been placing Betadine on. Patient History Information obtained from Patient, Chart. Allergies gemfibrozil,  OxyContin, pork derived (porcine), simvastatin Family History Diabetes - Mother,Siblings, Heart Disease - Siblings, Hypertension - Siblings, Stroke - Mother, No family history of Cancer, Hereditary Spherocytosis, Kidney Disease, Lung Disease, Seizures, Thyroid Problems, Tuberculosis. Social History Former smoker, Marital Status - Single, Alcohol Use - Never, Drug Use - No History, Caffeine Use - Daily. Medical History Eyes Denies history of Cataracts, Glaucoma, Optic Neuritis Respiratory Patient has history of Asthma, Sleep Apnea Denies history of Aspiration, Chronic Obstructive Pulmonary Disease (COPD), Pneumothorax, Tuberculosis Cardiovascular Patient has history of Hypertension, Peripheral Venous Disease Denies history of Angina, Arrhythmia, Congestive Heart Failure, Coronary Artery Disease, Deep Vein Thrombosis, Hypotension, Myocardial Infarction, Peripheral Arterial Disease, Phlebitis, Vasculitis Endocrine Patient has history of Type II Diabetes Denies history of Type I Diabetes Integumentary (Skin) Denies history of History of Burn Musculoskeletal Patient has history of Osteomyelitis Denies history of Gout, Rheumatoid Arthritis, Osteoarthritis Neurologic Patient has history of Neuropathy Denies history of Dementia, Quadriplegia, Paraplegia, Seizure Disorder Patient is treated with Insulin, Oral Agents. Blood sugar is tested. Hospitalization/Surgery History - 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 Posey Pronto. - aortogram 12/08/2021 Dr. Virl Cagey [facility]. Medical [name] Surgical History Notes nd Psychiatric PTSD Review of Systems (ROS) Constitutional Symptoms (General Health) Denies complaints or symptoms of Fatigue, Fever, Chills, Marked Weight Change. Eyes Complains or has symptoms of Glasses / Contacts. Ear/Nose/Mouth/Throat Denies complaints or symptoms of Chronic sinus problems or rhinitis. Respiratory Denies complaints or symptoms of Chronic or frequent coughs,  Shortness of Breath. Cardiovascular Denies complaints or symptoms of Chest pain. Gastrointestinal Denies complaints or symptoms of Frequent diarrhea, Nausea, Vomiting. Endocrine Denies complaints or symptoms of Heat/cold intolerance. Genitourinary Denies complaints or symptoms of Frequent urination. Integumentary (Skin) Complains or has symptoms of Wounds - left foot. Musculoskeletal Denies complaints or symptoms of Muscle Pain, Muscle Weakness. Neurologic [name] [name], [name] [name] ([id number]) [id number] Page 9 of 13 Denies complaints or symptoms of Numbness/parasthesias. Psychiatric Denies complaints or symptoms of Claustrophobia. Objective Constitutional respirations regular, non-labored and within target range for patient.. Vitals Time Taken: 9:22 AM, Height: 72 in, Source: Stated, Weight: 220 lbs, Source: Stated, BMI: 29.8, Temperature: 98.6 F, Pulse: 99 bpm, Respiratory Rate: 18 breaths/min, Blood Pressure: 124/79 mmHg, Capillary Blood Glucose: 162 mg/dl. Cardiovascular 2+ dorsalis pedis/posterior tibialis pulses. Psychiatric pleasant and cooperative. General Notes: Left foot: T the lateral aspect there is an open wound with granulation tissue and nonviable tissue and exposed bone. No signs of surrounding o soft tissue infection.  T the dorsal aspect there is an open wound with nonviable tissue throughout. o Integumentary (Hair, Skin) Wound #4 status is Open. Original cause of wound was Gradually Appeared. The date acquired was: 09/03/2021. The wound is located on the Left,Dorsal Foot. The wound measures 3cm length x 1cm width x 0.2cm depth; 2.356cm^2 area and 0.471cm^3 volume. There is no tunneling or undermining noted. There is [name] medium amount of serosanguineous drainage noted. The wound margin is distinct with the outline attached to the wound base. There is [name] large (67-100%) amount of necrotic tissue within the wound bed including Eschar and Adherent  Slough. The periwound skin appearance exhibited: Dry/Scaly. The periwound skin appearance did not exhibit: Callus, Crepitus, Excoriation, Induration, Rash, Scarring, Maceration, Atrophie Blanche, Cyanosis, Ecchymosis, Hemosiderin Staining, Mottled, Pallor, Rubor, Erythema. Wound #5 status is Open. Original cause of wound was Surgical Injury. The date acquired was: 12/20/2021. The wound is located on the Left,Lateral Foot. The wound measures 6.7cm length x 1.9cm width x 0.5cm depth; 9.998cm^2 area and 4.999cm^3 volume. There is bone and Fat Layer (Subcutaneous Tissue) exposed. There is no tunneling or undermining noted. There is [name] medium amount of serosanguineous drainage noted. The wound margin is epibole. There is large (67-100%) red, pink granulation within the wound bed. There is [name] small (1-33%) amount of necrotic tissue within the wound bed including Adherent Slough. The periwound skin appearance exhibited: Maceration. The periwound skin appearance did not exhibit: Callus, Crepitus, Excoriation, Induration, Rash, Scarring, Dry/Scaly, Atrophie Blanche, Cyanosis, Ecchymosis, Hemosiderin Staining, Mottled, Pallor, Rubor, Erythema. 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 He presents today status post fifth ray amputation to the left foot with [name] nonhealing wound. He has had [name] wound VAC for the past 3 weeks. He has exposed bone on exam and [name] bone biopsy and bone culture was taken today. I recommended holding the [facility] for now until we have results. He can use Dakin's wet-to-dry dressings to the lateral foot wound. I debrided nonviable tissue on the dorsal aspect of  the left foot. T this I recommended Medihoney and Hydrofera Blue. Follow-up in 1 week. o Procedures Wound #4 Pre-procedure diagnosis of Wound #4 is [name] Diabetic Wound/Ulcer of the Lower Extremity located on the Left,Dorsal Foot .Severity of Tissue Pre Debridement is: Fat layer exposed. There was [name] Excisional Skin/Subcutaneous Tissue Debridement with [name] total area of 3 sq cm performed by [name], DO. With the following instrumentZamoras): Curette to remove Viable and Non-Viable tissue/material. Material removed includes Eschar, Subcutaneous Tissue, Slough, Skin: Dermis, and Skin: Epidermis after achieving pain control using Lidocaine 5% topical ointment. [name] time out was conducted at 10:15, prior to the start of the procedure. [name] Minimum amount of bleeding was controlled with Pressure. The procedure was tolerated well with [name] pain level of 0 throughout and [name] pain level of 0 following the procedure. Post Debridement Measurements: 3cm length x 1cm width x 0.1cm depth; 0.236cm^3 volume. Character of Wound/Ulcer Post Debridement requires further debridement. Severity of Tissue Post Debridement is: Fat layer exposed. Post procedure Diagnosis Wound #4: Same as Pre-Procedure Wound #5 Pre-procedure diagnosis of Wound #5 is [name] Diabetic Wound/Ulcer of the Lower Extremity located on the Left,Lateral  Foot .Severity of Tissue Pre Debridement is: Necrosis of bone. There was [name] Excisional Skin/Subcutaneous Tissue/Muscle/Bone Debridement with [name] total area of 12.73 sq cm performed by [name], DO. With the following instrumentZamoras): Curette to remove Viable and Non-Viable tissue/material. Material removed includes Bone,Eschar, Subcutaneous Tissue, Slough, Skin: Dermis, and Skin: Epidermis after achieving pain control using Lidocaine 5% topical ointment. 2 specimens were taken by [name] Swab and [name] [name], [name] [name] ([id number]) [id number] Page 10 of 13 sent to the lab per facility protocol. [name]  time out was conducted at 10:15, prior to the start of the procedure. [name] Minimum amount of bleeding was controlled with Pressure. The procedure was tolerated well with [name] pain level of 0 throughout and [name] pain level of 0 following the procedure. Post Debridement Measurements: 6.7cm length x 1.9cm width x 0.5cm depth; 4.999cm^3 volume. Character of Wound/Ulcer Post Debridement requires further debridement. Severity of Tissue Post Debridement is: Necrosis of bone. Post procedure Diagnosis Wound #5: Same as Pre-Procedure Plan Follow-up Appointments: Return Appointment in 1 week. - Dr. Heber Raymond and [place], Room 8 Thursday Anesthetic: (In clinic) Topical Lidocaine 5% applied to wound bed Bathing/ Shower/ Hygiene: May shower with protection but do not get wound dressingZamoras) wet. Negative Presssure Wound Therapy: Other: - HOLD WOUND VAC UNTIL BONE BIOPSY AND CULTURE TO RETURNS. Edema Control - Lymphedema / SCD / Other: Elevate legs to the level of the heart or above for 30 minutes daily and/or when sitting, [name] frequency of: - 3-4 times [name] day throughout the day. Avoid standing for long periods of time. Off-Loading: Open toe surgical shoe to: - wear when standing or walking. Home Health: New wound care orders this week; continue Home Health for wound care. May utilize formulary equivalent dressing for wound treatment orders unless otherwise specified. - dakins wet to dry to left lateral foot. medihoney and hydrofera blue to dorsal foot. Home health x3 times [name] week, wound center weekly, and all other day family to change. Dressing changes to be completed by [place] on Monday / Wednesday / Friday except when patient has scheduled visit at [facility]. Other Home Health Orders/Instructions: - [facility]. Laboratory ordered were: Biopsy specimen culture - Call you once the results return, Anaerobic culture - Bone culture PCR- will call you once results returns. The following medicationZamoras)  was prescribed: lidocaine topical 5 % gel gel topical applied only in clinic for debridements. was prescribed at facility WOUND #4: - Foot Wound Laterality: Dorsal, Left Cleanser: Wound Cleanser (Home Health) 1 x Per Day/30 Days Discharge Instructions: Cleanse the wound with wound cleanser prior to applying [name] clean dressing using gauze sponges, not tissue or cotton balls. [name]-Wound Care: Skin Prep (Home Health) 1 x Per Day/30 Days Discharge Instructions: Use skin prep as directed Prim Dressing: Hydrofera Blue Ready Foam, 4x5 in (Home Health) 1 x Per Day/30 Days ary Discharge Instructions: Apply to wound bed as instructed Prim Dressing: MediHoney Gel, tube 1.5 (oz) ([place]) 1 x Per Day/30 Days ary Discharge Instructions: Apply to wound bed as instructed Secondary Dressing: ABD Pad, 8x10 ([place]) 1 x Per Day/30 Days Discharge Instructions: Apply over primary dressing as directed. Secured With: [employer], 4.5x3.1 (in/yd) (Home Health) 1 x Per Day/30 Days Discharge Instructions: Secure with Kerlix as directed. Secured With: 62M Medipore H Soft Cloth Surgical T ape, 4 x 10 (in/yd) (Home Health) 1 x Per Day/30 Days Discharge Instructions: Secure with tape as directed. WOUND #5: - Foot Wound Laterality: Left, Lateral  Cleanser: Wound Cleanser (Home Health) 1 x Per Day/30 Days Discharge Instructions: Cleanse the wound with wound cleanser prior to applying [name] clean dressing using gauze sponges, not tissue or cotton balls. Prim Dressing: Dakin's Solution 0.25%, 16 (oz) (Home Health) 1 x Per Day/30 Days ary Discharge Instructions: Moisten gauze with Dakin's solution Secondary Dressing: ABD Pad, 8x10 (Home Health) 1 x Per Day/30 Days Discharge Instructions: Apply over primary dressing as directed. Secured With: [employer], 4.5x3.1 (in/yd) (Home Health) 1 x Per Day/30 Days Discharge Instructions: Secure with Kerlix as directed. Secured With: 57M Medipore H Soft Cloth Surgical  T ape, 4 x 10 (in/yd) (Home Health) 1 x Per Day/30 Days Discharge Instructions: Secure with tape as directed. 1. In office sharp debridement, bone biopsy and bone culture 2. Medihoney and Hydrofera Blue 3. Aggressive offloadingoosurgical shoe 4. Follow-up in 1 week Electronic SignatureZamoras) Signed: 04/14/2022 11:37:20 AM By: [name] DO Entered By: [name] on 04/14/2022 11:33:40 -------------------------------------------------------------------------------- HxROS Details Patient Name: Date of Service: [name] [name], [place] [name] [name]. 04/14/2022 9:45 [name] Cyndie Chime [name] ([id number]) [id number] Page 11 of 13 Medical Record Number: [id number] Patient Account Number: [id number] Date of Birth/Sex: Treating RN: [birth year] (66 y.o. [name] [name] Primary Care Provider: York Ram Other Clinician: Referring Provider: Treating Provider/Extender: Seward Grater in Treatment: 0 Information Obtained From Patient Chart Constitutional Symptoms (General Health) Complaints and Symptoms: Negative for: Fatigue; Fever; Chills; Marked Weight Change Eyes Complaints and Symptoms: Positive for: Glasses / Contacts Medical History: Negative for: Cataracts; Glaucoma; Optic Neuritis Ear/Nose/Mouth/Throat Complaints and Symptoms: Negative for: Chronic sinus problems or rhinitis Respiratory Complaints and Symptoms: Negative for: Chronic or frequent coughs; Shortness of Breath Medical History: Positive for: Asthma; Sleep Apnea Negative for: Aspiration; Chronic Obstructive Pulmonary Disease (COPD); Pneumothorax; Tuberculosis Cardiovascular Complaints and Symptoms: Negative for: Chest pain Medical History: Positive for: Hypertension; Peripheral Venous Disease Negative for: Angina; Arrhythmia; Congestive Heart Failure; Coronary Artery Disease; Deep Vein Thrombosis; Hypotension; Myocardial Infarction; Peripheral Arterial Disease; Phlebitis;  Vasculitis Gastrointestinal Complaints and Symptoms: Negative for: Frequent diarrhea; Nausea; Vomiting Endocrine Complaints and Symptoms: Negative for: Heat/cold intolerance Medical History: Positive for: Type II Diabetes Negative for: Type I Diabetes Time with diabetes: 20 years Treated with: Insulin, Oral agents Blood sugar tested every day: Yes Tested : 4x day Genitourinary Complaints and Symptoms: Negative for: Frequent urination Integumentary (Skin) Complaints and Symptoms: Positive for: Wounds - left foot Medical History: Negative for: History of Burn Musculoskeletal Complaints and Symptoms: Negative for: Muscle Pain; Muscle Weakness HENRRY, FEIL [name] ([id number]) [id number] Page 12 of 13 Medical History: Positive for: Osteomyelitis Negative for: Gout; Rheumatoid Arthritis; Osteoarthritis Neurologic Complaints and Symptoms: Negative for: Numbness/parasthesias Medical History: Positive for: Neuropathy Negative for: Dementia; Quadriplegia; Paraplegia; Seizure Disorder Psychiatric Complaints and Symptoms: Negative for: Claustrophobia Medical History: Past Medical History Notes: PTSD Hematologic/Lymphatic Immunological Oncologic Immunizations Pneumococcal Vaccine: Received Pneumococcal Vaccination: No Implantable Devices None Hospitalization / Surgery History Type of Hospitalization/Surgery 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 Posey Pronto aortogram 12/08/2021 Dr. Virl Cagey [facility] Family and Social History Cancer: No; Diabetes: Yes - Mother,Siblings; Heart Disease: Yes - Siblings; Hereditary Spherocytosis: No; Hypertension: Yes - Siblings; Kidney Disease: No; Lung Disease: No; Seizures: No; Stroke: Yes - Mother; Thyroid Problems: No; Tuberculosis: No; Former smoker; Marital Status - Single; Alcohol Use: Never; Drug Use: No History; Caffeine Use: Daily; Financial Concerns: No; Food, Clothing or Shelter Needs: No; Support  System Lacking: No; Transportation Concerns: No Electronic SignatureZamoras) Signed: 04/14/2022 11:37:20 AM By: Heber Groesbeck,  Janett Billow DO Signed: 04/14/2022 2:19:52 PM By: [name] Signed: 04/14/2022 6:12:04 PM By: [name] RN, [name] Entered By: [name] on 04/14/2022 09:20:49 -------------------------------------------------------------------------------- SuperBill Details Patient Name: Date of Service: [name] [name], [place] [name] [name]. 04/14/2022 Medical Record Number: [id number] Patient Account Number: [id number] Date of Birth/Sex: Treating RN: [birth year] (66 y.o. [name] [name] Primary Care Provider: York Ram Other Clinician: Referring Provider: Treating Provider/Extender: Seward Grater in Treatment: 0 Diagnosis Coding ICD-10 Codes Code Description [phone] Non-pressure chronic ulcer of other part of left foot with fat layer exposed ARNOLDO, HILDRETH [name] ([id number]) [phone].pdf Page 13 of 13 L97.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 specified severity E11.621 Type 2 diabetes mellitus with foot ulcer E11.42 Type 2 diabetes mellitus with diabetic polyneuropathy Facility Procedures : CPT4 Code: 19622297 Description: 98921 - WOUND CARE VISIT-LEV 3 EST PT Modifier: Quantity: 1 : CPT4 Code: 19417408 Description: 14481 - DEB BONE 20 SQ CM/< ICD-10 Diagnosis Description E11.621 Type 2 diabetes mellitus with foot ulcer Modifier: Quantity: 1 Physician Procedures : CPT4: Description Modifier Code [id number] 70263 - WC PHYS LEVEL 3 - EST PT ICD-10 Diagnosis Description 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.528 Non-pressure chronic ulcer of other part of left foot with  other specified severity E11.621 Type 2 diabetes mellitus with foot ulcer E11.42 Type 2 diabetes mellitus with diabetic polyneuropathy Quantity: 1 : CPT4: [id number] Debridement; bone (includes  epidermis, dermis, subQ tissue, muscle and/or fascia, if performed) 1st 20 sqcm or less ICD-10 Diagnosis Description E11.621 Type 2 diabetes mellitus with foot ulcer Quantity: 1 Electronic SignatureZamoras) Signed: 04/14/2022 11:37:20 AM By: [name] DO Entered By: [name] on 04/14/2022 11:34:03

## 2022-04-14 NOTE — Progress Notes (Signed)
SHAHAB, POLHAMUS [name] ([id number]) [phone].pdf Page 1 of 4 Visit Report for 04/14/2022 Abuse Risk Screen Details Patient Name: Date of Service: [name] [name], [name] [name]. 04/14/2022 9:45 [name] [name] Medical Record Number: [id number] Patient Account Number: [id number] Date of Birth/Sex: Treating RN: [name] (66 y.o. [name]) Primary Care Shyrl Obi: York Ram Other Clinician: Referring Kabrina Christiano: Treating Mala Gibbard/Extender: Seward Grater in Treatment: 0 Abuse Risk Screen Items Answer ABUSE RISK SCREEN: Has anyone close to you tried to hurt or harm you recentlyo No Do you feel uncomfortable with anyone in your familyo No Has anyone forced you do things that you didnt want to doo No Electronic Signature(s) Signed: 04/14/2022 2:19:52 PM By: [name] Entered By: [name] on 04/14/2022 09:24:17 -------------------------------------------------------------------------------- Activities of Daily Living Details Patient Name: Date of Service: [name] [name]. 04/14/2022 9:45 [name] [name] Medical Record Number: [id number] Patient Account Number: [id number] Date of Birth/Sex: Treating RN: [name] [name] (66 y.o. [name]) Primary Care Stasha Naraine: York Ram Other Clinician: Referring Milagros Middendorf: Treating Culley Hedeen/Extender: Seward Grater in Treatment: 0 Activities of Daily Living Items Answer Activities of Daily Living (Please select one for each item) Drive Automobile Completely Able T Medications ake Completely Able Use T elephone Completely Able Care for Appearance Completely Able Use T oilet Completely Able Bath / Shower Completely Able Dress Self Completely Able Feed Self Completely Able Walk Completely Able Get In / Out Bed Completely Able Housework Completely Able Prepare Meals Completely Able Handle Money Completely Able Shop for Self Completely Able Electronic Signature(s) Signed: 04/14/2022 2:19:52 PM By: [name] Entered By: [name] on 04/14/2022 09:24:54 [name] [name] ([id number]) [id number] Page 2 of 4 -------------------------------------------------------------------------------- Education Screening Details Patient Name: Date of Service: [name] [name], [name] [name]. 04/14/2022 9:45 [name] [name] Medical Record Number: [id number] Patient Account Number: [id number] Date of Birth/Sex: Treating RN: [birth year] [name] (66 y.o. [name]) Primary Care Gunhild Bautch: York Ram Other Clinician: Referring November Sypher: Treating Natsumi Whitsitt/Extender: Seward Grater in Treatment: 0 Primary Learner Assessed: Patient Learning Preferences/Education Level/Primary Language Learning Preference: Explanation, Demonstration, Printed Material Highest Education Level: College or Above Preferred Language: English Cognitive Barrier Language Barrier: No Translator Needed: No Memory Deficit: No Emotional Barrier: No Cultural/Religious Beliefs Affecting Medical Care: No Physical Barrier Impaired Vision: Yes Glasses Impaired Hearing: No Decreased Hand dexterity: No Knowledge/Comprehension Knowledge Level: High Comprehension Level: High Ability to understand written instructions: High Ability to understand verbal instructions: High Motivation Anxiety Level: Calm Cooperation: Cooperative Education Importance: Acknowledges Need Interest in Health Problems: Asks Questions Perception: Coherent Willingness to Engage in Self-Management High Activities: Readiness to Engage in Self-Management High Activities: Electronic Signature(s) Signed: 04/14/2022 2:19:52 PM By: [name] Entered By: [name] on 04/14/2022 09:26:05 -------------------------------------------------------------------------------- Fall Risk Assessment Details Patient Name: Date of Service: [name] [name], [name] [name]. 04/14/2022 9:45 [name] [name] Medical Record Number: [id number] Patient Account Number:  [id number] Date of Birth/Sex: Treating RN: [birth year] (66 y.o. [name]) Primary Care Goro Wenrick: York Ram Other Clinician: Referring Chloe Bluett: Treating Brendan Gruwell/Extender: Seward Grater in Treatment: 0 Fall Risk Assessment Items Have you had 2 or more falls in the last 12 monthso 0 No [name] [name], [name] [name] ([id number]) [phone] [id number] Page 3 of 4 Have you had any fall that resulted in injury in the last 12 monthso 0 No FALLS RISK SCREEN History of falling - immediate or within 3 months 0 No Secondary diagnosis (Do you have 2 or more medical diagnoseso) 0 No Ambulatory aid None/bed  rest/wheelchair/nurse 0 Yes Crutches/cane/walker 0 No Furniture 0 No Intravenous therapy Access/Saline/Heparin Lock 0 No Gait/Transferring Normal/ bed rest/ wheelchair 0 Yes Weak (short steps with or without shuffle, stooped but able to lift head while walking, may seek 0 No support from furniture) Impaired (short steps with shuffle, may have difficulty arising from chair, head down, impaired 0 No balance) Mental Status Oriented to own ability 0 Yes Electronic Signature(s) Signed: 04/14/2022 2:19:52 PM By: [name] Entered By: [name] on 04/14/2022 09:26:31 -------------------------------------------------------------------------------- Foot Assessment Details Patient Name: Date of Service: [name] [name], [name] [name]. 04/14/2022 9:45 [name] [name] Medical Record Number: [id number] Patient Account Number: [id number] Date of Birth/Sex: Treating RN: [name] [name], [name] [name] Primary Care Ellieanna Funderburg: York Ram Other Clinician: Referring Satsuki Zillmer: Treating Sukhraj Esquivias/Extender: Seward Grater in Treatment: 0 Foot Assessment Items Site Locations + = Sensation present, - = Sensation absent, C = Callus, U = Ulcer R = Redness, W = Warmth, [name] = Maceration, PU = Pre-ulcerative lesion F = Fissure, S = Swelling, D =  Dryness Assessment Right: Left: Other Deformity: No No Prior Foot Ulcer: No No Prior Amputation: No No Charcot Joint: No No Ambulatory Status: Ambulatory With Help Assistance Device: Wheelchair [name] [name], [name] [name] ([id number]) [phone].pdf Page 4 of 4 Gait: Steady Electronic Signature(s) Signed: 04/14/2022 6:12:04 PM By: [name] RN, [name] Entered By: [name] on 04/14/2022 09:57:55 -------------------------------------------------------------------------------- Nutrition Risk Screening Details Patient Name: Date of Service: [name] [name], [name] [name]. 04/14/2022 9:45 [name] [name] Medical Record Number: [id number] Patient Account Number: [id number] Date of Birth/Sex: Treating RN: [birth year] [name] (66 y.o. [name]) Primary Care Jere Vanburen: York Ram Other Clinician: Referring London Nonaka: Treating Damani Rando/Extender: Seward Grater in Treatment: 0 Height (in): 72 Weight (lbs): 220 Body Mass Index (BMI): 29.8 Nutrition Risk Screening Items Score Screening NUTRITION RISK SCREEN: I have an illness or condition that made me change the kind and/or amount of food I eat 0 No I eat fewer than two meals per day 0 No I eat few fruits and vegetables, or milk products 0 No I have three or more drinks of beer, liquor or wine almost every day 0 No I have tooth or mouth problems that make it hard for me to eat 0 No I don't always have enough money to buy the food I need 0 No I eat alone most of the time 0 No I take three or more different prescribed or over-the-counter drugs [name] day 1 Yes Without wanting to, I have lost or gained 10 pounds in the last six months 0 No I am not always physically able to shop, cook and/or feed myself 0 No Nutrition Protocols Good Risk Protocol 0 No interventions needed Moderate Risk Protocol High Risk Proctocol Risk Level: Good Risk Score: 1 Electronic Signature(s) Signed: 04/14/2022 2:19:52 PM By: [name] Entered  By: [name] on 04/14/2022 09:27:38

## 2022-04-14 NOTE — Progress Notes (Signed)
LEVERT, HESLOP [name] ([id number]) [id number] Page 1 of 11 Visit Report for 04/14/2022 Allergy List Details Patient Name: Date of Service: [name], [name] 04/14/2022 9:45 [name] Medical Record Number: [id number] Patient Account Number: [id number] Date of Birth/Sex: Treating RN: 02-15-1956 (66 y.o. [name] Primary Care Edge Mauger: York Ram Other Clinician: Referring Lissa Rowles: Treating Lan Entsminger/Extender: Seward Grater in Treatment: 0 Allergies Active Allergies gemfibrozil OxyContin pork derived (porcine) simvastatin Allergy Notes Electronic Signature(s) Signed: 04/14/2022 2:19:52 PM By: [name] Entered By: [name] on 04/14/2022 09:23:59 -------------------------------------------------------------------------------- Arrival Information Details Patient Name: Date of Service: [name], [name] 04/14/2022 9:45 [name] Medical Record Number: [id number] Patient Account Number: [id number] Date of Birth/Sex: Treating RN: [birth year] (66 y.o. M) Primary Care Gentry Pilson: York Ram Other Clinician: Referring Abagayle Klutts: Treating Brieana Shimmin/Extender: Seward Grater in Treatment: 0 Visit Information Patient Arrived: Wheel Chair Arrival Time: 09:13 Accompanied By: wife Transfer Assistance: None Patient Identification Verified: Yes Secondary Verification Process Completed: Yes Patient Requires Transmission-Based Precautions: No Patient Has Alerts: No History Since Last Visit Added or deleted any medications: No Any new allergies or adverse reactions: No Had [name] fall or experienced change in activities of daily living that may affect risk of falls: No Signs or symptoms of abuse/neglect since last visito No Hospitalized since last visit: No Implantable device outside of the clinic excluding cellular tissue based products placed in the center since last visit: No Pain Present Now:  No Electronic Signature(s) Signed: 04/14/2022 2:19:52 PM By: [name] Entered By: [name] on 04/14/2022 09:18:39 [name] ([id number]) [id number] Page 2 of 11 -------------------------------------------------------------------------------- Clinic Level of Care Assessment Details Patient Name: Date of Service: [name], [name]. 04/14/2022 9:45 [name] Medical Record Number: [id number] Patient Account Number: [id number] Date of Birth/Sex: Treating RN: [birth year] (66 y.o. [name] Primary Care Resha Filippone: York Ram Other Clinician: Referring Sahira Cataldi: Treating Fredrik Mogel/Extender: Seward Grater in Treatment: 0 Clinic Level of Care Assessment Items TOOL 1 Quantity Score X- 1 0 Use when EandM and Procedure is performed on INITIAL visit ASSESSMENTS - Nursing Assessment / Reassessment X- 1 20 General Physical Exam (combine w/ comprehensive assessment (listed just below) when performed on new pt. evals) X- 1 25 Comprehensive Assessment (HX, ROS, Risk Assessments, Wounds Hx, etc.) ASSESSMENTS - Wound and Skin Assessment / Reassessment X- 1 10 Dermatologic / Skin Assessment (not related to wound area) ASSESSMENTS - Ostomy and/or Continence Assessment and Care '[]'$  - 0 Incontinence Assessment and Management '[]'$  - 0 Ostomy Care Assessment and Management (repouching, etc.) PROCESS - Coordination of Care '[]'$  - 0 Simple Patient / Family Education for ongoing care X- 1 20 Complex (extensive) Patient / Family Education for ongoing care X- 1 10 Staff obtains Programmer, systems, Records, T Results / Process Orders est X- 1 10 Staff telephones HHA, Nursing Homes / Clarify orders / etc '[]'$  - 0 Routine Transfer to another Facility (non-emergent condition) '[]'$  - 0 Routine Hospital Admission (non-emergent condition) X- 1 15 New Admissions / Biomedical engineer / Ordering NPWT Apligraf, etc. , '[]'$  - 0 Emergency Hospital  Admission (emergent condition) PROCESS - Special Needs '[]'$  - 0 Pediatric / Minor Patient Management '[]'$  - 0 Isolation Patient Management '[]'$  - 0 Hearing / Language / Visual special needs '[]'$  - 0 Assessment of Community assistance (transportation, D/C planning, etc.) '[]'$  - 0 Additional assistance / Altered mentation '[]'$  - 0 Support Surface(s) Assessment (bed, cushion, seat, etc.) INTERVENTIONS -  Miscellaneous '[]'$  - 0 External ear exam '[]'$  - 0 Patient Transfer (multiple staff / Civil Service fast streamer / Similar devices) '[]'$  - 0 Simple Staple / Suture removal (25 or less) '[]'$  - 0 Complex Staple / Suture removal (26 or more) '[]'$  - 0 Hypo/Hyperglycemic Management (do not check if billed separately) '[]'$  - 0 Ankle / Brachial Index (ABI) - do not check if billed separately Has the patient been seen at the hospital within the last three years: Yes Total Score: 110 Level Of Care: New/Established - Level 3 Electronic Signature(s) Signed: 04/14/2022 6:12:04 PM By: [name] RN, [name] [name] ([id number]) PM By: [name] RN, [name] [phone].pdf Page 3 of 11 Signed: 04/14/2022 6:12:04 Entered By: [name] on 04/14/2022 10:35:20 -------------------------------------------------------------------------------- Encounter Discharge Information Details Patient Name: Date of Service: [name]. 04/14/2022 9:45 [name] Medical Record Number: [id number] Patient Account Number: [id number] Date of Birth/Sex: Treating RN: 09/20/1955 (66 y.o. [name] Primary Care Asad Keeven: York Ram Other Clinician: Referring Kolbe Delmonaco: Treating Mellonie Guess/Extender: Seward Grater in Treatment: 0 Encounter Discharge Information Items Post Procedure Vitals Discharge Condition: Stable Temperature (F): 98.6 Ambulatory Status: Wheelchair Pulse (bpm): 99 Discharge Destination: Home Respiratory Rate (breaths/min): 18 Transportation: Private Auto Blood  Pressure (mmHg): 124/79 Accompanied By: family Schedule Follow-up Appointment: Yes Clinical Summary of Care: Electronic Signature(s) Signed: 04/14/2022 6:12:04 PM By: [name] RN, [name] Entered By: [name] on 04/14/2022 10:36:58 -------------------------------------------------------------------------------- Lower Extremity Assessment Details Patient Name: Date of Service: [name], [name] 04/14/2022 9:45 [name] Medical Record Number: [id number] Patient Account Number: [id number] Date of Birth/Sex: Treating RN: 06-Apr-1956 (66 y.o. [name] Primary Care Evelyne Makepeace: York Ram Other Clinician: Referring Catharina Pica: Treating Charolette Bultman/Extender: Seward Grater in Treatment: 0 Edema Assessment Assessed: [name]: Yes] [name]: No] Edema: [Left: Ye] [Right: s] Calf Left: Right: Point of Measurement: 36 cm From Medial Instep 38.1 cm Ankle Left: Right: Point of Measurement: 9 cm From Medial Instep 22.5 cm Knee To Floor Left: Right: From Medial Instep 43 cm Vascular Assessment Pulses: Dorsalis Pedis Palpable: [Left:Yes] YUVAN, MEDINGER [name] ([id number]) [[id number].pdf Page 4 of 11] Electronic Signature(s) Signed: 04/14/2022 6:12:04 PM By: [name] RN, [name] Entered By: [name] on 04/14/2022 09:58:30 -------------------------------------------------------------------------------- Multi Wound Chart Details Patient Name: Date of Service: [name], [name] 04/14/2022 9:45 [name] Medical Record Number: [id number] Patient Account Number: [id number] Date of Birth/Sex: Treating RN: Jul 18, 1955 (66 y.o. M) Primary Care Mylo Choi: York Ram Other Clinician: Referring Jere Bostrom: Treating Kaleen Rochette/Extender: Seward Grater in Treatment: 0 Vital Signs Height(in): 72 Capillary Blood Glucose(mg/dl): 162 Weight(lbs): 220 Pulse(bpm): 80 Body Mass Index(BMI): 29.8 Blood Pressure(mmHg):  124/79 Temperature(F): 98.6 Respiratory Rate(breaths/min): 18 [4:Photos:] [[name]:[name]] Left, Dorsal Foot Left, Lateral Foot [name] Wound Location: Gradually Appeared Surgical Injury [name] Wounding Event: Diabetic Wound/Ulcer of the Lower Diabetic Wound/Ulcer of the Lower [name] Primary Etiology: Extremity Extremity [name] Open Surgical Wound [name] Secondary Etiology: Asthma, Sleep Apnea, Hypertension, Asthma, Sleep Apnea, Hypertension, [name] Comorbid History: Peripheral Venous Disease, Type II Peripheral Venous Disease, Type II Diabetes, Osteomyelitis, Neuropathy Diabetes, Osteomyelitis, Neuropathy 09/03/2021 12/20/2021 [name] Date Acquired: 0 0 [name] Weeks of Treatment: Open Open [name] Wound Status: No No [name] Wound Recurrence: 3x1x0.2 6.7x1.9x0.5 [name] Measurements L x W x D (cm) 2.356 9.998 [name] [name] (cm) : rea 0.471 4.999 [name] Volume (cm) : Grade 3 Grade 3 [name] Classification: Medium Medium [name] Exudate [name] mount: Serosanguineous Serosanguineous [name] Exudate Type: red, brown  red, brown [name] Exudate Color: Distinct, outline attached Epibole [name] Wound Margin: [name] Large (67-100%) [name] Granulation [name] mount: [name] Red, Pink [name] Granulation Quality: [name] Small (1-33%) [name] Necrotic [name] mount: Eschar, Adherent Slough Adherent Slough [name] Necrotic Tissue: Fascia: No Fat Layer (Subcutaneous Tissue): Yes [name] Exposed Structures: Fat Layer (Subcutaneous Tissue): No Bone: Yes Tendon: No Fascia: No Muscle: No Tendon: No Joint: No Muscle: No Bone: No Joint: No None Small (1-33%) [name] Epithelialization: Debridement - Excisional Debridement - Excisional [name] Debridement: Pre-procedure Verification/Time Out 10:15 10:15 [name] Taken: Lidocaine 5% topical ointment Lidocaine 5% topical ointment [name] Pain Control: Necrotic/Eschar, Subcutaneous, Necrotic/Eschar, Bone, Subcutaneous, [name] Tissue Debrided: [facility] Skin/Subcutaneous Tissue Skin/Subcutaneous [name] Level: Tissue/Muscle/Bone 3 12.73  [name] Debridement [name] (sq cm): rea [name], [name] ([id number]) [id number] Page 5 of 11 Curette Curette [name] Instrument: [name] Swab [name] Specimen: [name] 2 [name] Number of Specimens Taken: Minimum Minimum [name] Bleeding: Pressure Pressure [name] Hemostasis Achieved: 0 0 [name] Procedural Pain: 0 0 [name] Post Procedural Pain: Procedure was tolerated well Procedure was tolerated well [name] Debridement Treatment Response: 3x1x0.1 6.7x1.9x0.5 [name] Post Debridement Measurements L x W x D (cm) 0.236 4.999 [name] Post Debridement Volume: (cm) Excoriation: No Excoriation: No [name] Periwound Skin Texture: Induration: No Induration: No Callus: No Callus: No Crepitus: No Crepitus: No Rash: No Rash: No Scarring: No Scarring: No Dry/Scaly: Yes Maceration: Yes [name] Periwound Skin Moisture: Maceration: No Dry/Scaly: No Atrophie Blanche: No Atrophie Blanche: No [name] Periwound Skin Color: Cyanosis: No Cyanosis: No Ecchymosis: No Ecchymosis: No Erythema: No Erythema: No Hemosiderin Staining: No Hemosiderin Staining: No Mottled: No Mottled: No Pallor: No Pallor: No Rubor: No Rubor: No Debridement Debridement [name] Procedures Performed: Treatment Notes Wound #4 (Foot) Wound Laterality: Dorsal, Left Cleanser Wound Cleanser Discharge Instruction: Cleanse the wound with wound cleanser prior to applying [name] clean dressing using gauze sponges, not tissue or cotton balls. Peri-Wound Care Skin Prep Discharge Instruction: Use skin prep as directed Topical Primary Dressing Hydrofera Blue Ready Foam, 4x5 in Discharge Instruction: Apply to wound bed as instructed MediHoney Gel, tube 1.5 (oz) Discharge Instruction: Apply to wound bed as instructed Secondary Dressing ABD Pad, 8x10 Discharge Instruction: Apply over primary dressing as directed. Secured With [employer], 4.5x3.1 (in/yd) Discharge Instruction: Secure with Kerlix as directed. 9M Medipore H Soft Cloth  Surgical T ape, 4 x 10 (in/yd) Discharge Instruction: Secure with tape as directed. Compression Wrap Compression Stockings Add-Ons Wound #5 (Foot) Wound Laterality: Left, Lateral Cleanser Wound Cleanser Discharge Instruction: Cleanse the wound with wound cleanser prior to applying [name] clean dressing using gauze sponges, not tissue or cotton balls. Peri-Wound Care Topical Primary Dressing Dakin's Solution 0.25%, 16 (oz) Discharge Instruction: Moisten gauze with Dakin's solution [name], [name] ([id number]) [id number] Page 6 of 11 Secondary Dressing ABD Pad, 8x10 Discharge Instruction: Apply over primary dressing as directed. Secured With [employer], 4.5x3.1 (in/yd) Discharge Instruction: Secure with Kerlix as directed. 9M Medipore H Soft Cloth Surgical T ape, 4 x 10 (in/yd) Discharge Instruction: Secure with tape as directed. Compression Wrap Compression Stockings Add-Ons Electronic Signature(s) Signed: 04/14/2022 11:37:20 AM By: [name] DO Entered By: [name] on 04/14/2022 11:08:02 -------------------------------------------------------------------------------- Multi-Disciplinary Care Plan Details Patient Name: Date of Service: [name], [name] 04/14/2022 9:45 [name] Medical Record Number: [id number] Patient Account Number: [id number] Date of Birth/Sex: Treating RN: 1955-11-02 (66 y.o. [name] Primary Care Laronica Bhagat: York Ram Other Clinician: Referring Rosary Filosa: Treating Kristina Mcnorton/Extender: Seward Grater in Treatment: 0  Active Inactive Orientation to the Wound Care Program Nursing Diagnoses: Knowledge deficit related to the wound healing center program Goals: Patient/caregiver will verbalize understanding of the [place] Date Initiated: 04/14/2022 Target Resolution Date: 05/07/2022 Goal Status: Active Interventions: Provide education on orientation to the  wound center Notes: Wound/Skin Impairment Nursing Diagnoses: Knowledge deficit related to ulceration/compromised skin integrity Goals: Patient/caregiver will verbalize understanding of skin care regimen Date Initiated: 04/14/2022 Target Resolution Date: 05/08/2022 Goal Status: Active Interventions: Assess patient/caregiver ability to perform ulcer/skin care regimen upon admission and as needed Assess ulceration(s) every visit Provide education on ulcer and skin care Notes: Electronic Signature(s) [name], [name] ([id number]) [id number] Page 7 of 11 Signed: 04/14/2022 6:12:04 PM By: [name] RN, [name] Entered By: [name] on 04/14/2022 10:17:55 -------------------------------------------------------------------------------- Pain Assessment Details Patient Name: Date of Service: [name], [name] 04/14/2022 9:45 [name] Medical Record Number: [id number] Patient Account Number: [id number] Date of Birth/Sex: Treating RN: [birth year] (66 y.o. M) Primary Care Keighley Deckman: York Ram Other Clinician: Referring Gresham Caetano: Treating Roshan Roback/Extender: Seward Grater in Treatment: 0 Active Problems Location of Pain Severity and Description of Pain Patient Has Paino No Site Locations Pain Management and Medication Current Pain Management: Electronic Signature(s) Signed: 04/14/2022 2:19:52 PM By: [name] Entered By: [name] on 04/14/2022 09:51:51 -------------------------------------------------------------------------------- Patient/Caregiver Education Details Patient Name: Date of Service: [name], Vinetta Bergamo 10/10/2023andnbsp9:45 [name] Medical Record Number: [id number] Patient Account Number: [id number] Date of Birth/Gender: Treating RN: 1956-01-08 (66 y.o. [name] Primary Care Physician: York Ram Other Clinician: Referring Physician: Treating Physician/Extender: Seward Grater in Treatment: 0 Education Assessment Education Provided To: Patient [name], [name] ([id number]) [id number] Page 8 of 11 Education Topics Provided Spray: o Handouts: Welcome T [facility] [place] o Methods: Explain/Verbal Responses: Reinforcements needed Electronic Signature(s) Signed: 04/14/2022 6:12:04 PM By: [name] RN, [name] Entered By: [name] on 04/14/2022 10:18:02 -------------------------------------------------------------------------------- Wound Assessment Details Patient Name: Date of Service: [name], [name] 04/14/2022 9:45 [name] Medical Record Number: [id number] Patient Account Number: [id number] Date of Birth/Sex: Treating RN: Jul 29, 1955 (66 y.o. [name] Primary Care Russie Gulledge: York Ram Other Clinician: Referring Meital Riehl: Treating Starlena Beil/Extender: Seward Grater in Treatment: 0 Wound Status Wound Number: 4 Primary Diabetic Wound/Ulcer of the Lower Extremity Etiology: Wound Location: Left, Dorsal Foot Wound Open Wounding Event: Gradually Appeared Status: Date Acquired: 09/03/2021 Comorbid Asthma, Sleep Apnea, Hypertension, Peripheral Venous Disease, Weeks Of Treatment: 0 History: Type II Diabetes, Osteomyelitis, Neuropathy Clustered Wound: No Photos Wound Measurements Length: (cm) 3 Width: (cm) 1 Depth: (cm) 0.2 Area: (cm) 2.356 Volume: (cm) 0.471 % Reduction in Area: % Reduction in Volume: Epithelialization: None Tunneling: No Undermining: No Wound Description Classification: Grade 3 Wound Margin: Distinct, outline attached Exudate Amount: Medium Exudate Type: Serosanguineous Exudate Color: red, brown Foul Odor After Cleansing: No Slough/Fibrino Yes Wound Bed Necrotic Amount: Large (67-100%) Exposed Structure Necrotic Quality: Eschar, Adherent Slough Fascia Exposed: No Fat Layer (Subcutaneous Tissue) Exposed: No Tendon  Exposed: No Muscle Exposed: No Joint Exposed: No Bone Exposed: No [name], [name] ([id number]) [id number] Page 9 of 11 Periwound Skin Texture Texture Color No Abnormalities Noted: No No Abnormalities Noted: No Callus: No Atrophie Blanche: No Crepitus: No Cyanosis: No Excoriation: No Ecchymosis: No Induration: No Erythema: No Rash: No Hemosiderin Staining: No Scarring: No Mottled: No Pallor: No Moisture Rubor: No No Abnormalities Noted: No Dry /  Scaly: Yes Maceration: No Treatment Notes Wound #4 (Foot) Wound Laterality: Dorsal, Left Cleanser Wound Cleanser Discharge Instruction: Cleanse the wound with wound cleanser prior to applying [name] clean dressing using gauze sponges, not tissue or cotton balls. Peri-Wound Care Skin Prep Discharge Instruction: Use skin prep as directed Topical Primary Dressing Hydrofera Blue Ready Foam, 4x5 in Discharge Instruction: Apply to wound bed as instructed MediHoney Gel, tube 1.5 (oz) Discharge Instruction: Apply to wound bed as instructed Secondary Dressing ABD Pad, 8x10 Discharge Instruction: Apply over primary dressing as directed. Secured With [employer], 4.5x3.1 (in/yd) Discharge Instruction: Secure with Kerlix as directed. 37M Medipore H Soft Cloth Surgical T ape, 4 x 10 (in/yd) Discharge Instruction: Secure with tape as directed. Compression Wrap Compression Stockings Add-Ons Electronic Signature(s) Signed: 04/14/2022 6:12:04 PM By: [name] RN, [name] Entered By: [name] on 04/14/2022 10:19:17 -------------------------------------------------------------------------------- Wound Assessment Details Patient Name: Date of Service: [name], [name] 04/14/2022 9:45 [name] Medical Record Number: [id number] Patient Account Number: [id number] Date of Birth/Sex: Treating RN: 12-Feb-1956 (66 y.o. [name] Primary Care Ason Heslin: York Ram Other Clinician: Referring  Leonora Gores: Treating Shay Jhaveri/Extender: Seward Grater in Treatment: 0 Wound Status Wound Number: 5 Primary Diabetic Wound/Ulcer of the Lower Extremity Etiology: Wound Location: Left, Lateral Foot [name], GEFFRE [name] ([id number]) [id number] Page 10 of 11 Secondary Open Surgical Wound Wounding Event: Surgical Injury Etiology: Date Acquired: 12/20/2021 Wound Open Weeks Of Treatment: 0 Status: Clustered Wound: No Comorbid Asthma, Sleep Apnea, Hypertension, Peripheral Venous Disease, History: Type II Diabetes, Osteomyelitis, Neuropathy Photos Wound Measurements Length: (cm) 6.7 Width: (cm) 1.9 Depth: (cm) 0.5 Area: (cm) 9.998 Volume: (cm) 4.999 % Reduction in Area: % Reduction in Volume: Epithelialization: Small (1-33%) Tunneling: No Undermining: No Wound Description Classification: Grade 3 Wound Margin: Epibole Exudate Amount: Medium Exudate Type: Serosanguineous Exudate Color: red, brown Foul Odor After Cleansing: No Slough/Fibrino Yes Wound Bed Granulation Amount: Large (67-100%) Exposed Structure Granulation Quality: Red, Pink Fascia Exposed: No Necrotic Amount: Small (1-33%) Fat Layer (Subcutaneous Tissue) Exposed: Yes Necrotic Quality: Adherent Slough Tendon Exposed: No Muscle Exposed: No Joint Exposed: No Bone Exposed: Yes Periwound Skin Texture Texture Color No Abnormalities Noted: No No Abnormalities Noted: No Callus: No Atrophie Blanche: No Crepitus: No Cyanosis: No Excoriation: No Ecchymosis: No Induration: No Erythema: No Rash: No Hemosiderin Staining: No Scarring: No Mottled: No Pallor: No Moisture Rubor: No No Abnormalities Noted: No Dry / Scaly: No Maceration: Yes Treatment Notes Wound #5 (Foot) Wound Laterality: Left, Lateral Cleanser Wound Cleanser Discharge Instruction: Cleanse the wound with wound cleanser prior to applying [name] clean dressing using gauze sponges, not tissue or cotton  balls. Peri-Wound Care Topical Primary Dressing Dakin's Solution 0.25%, 16 (oz) Discharge Instruction: Moisten gauze with Dakin's solution Secondary Dressing JAKUB, DEBOLD [name] ([id number]) [id number] Page 11 of 11 ABD Pad, 8x10 Discharge Instruction: Apply over primary dressing as directed. Secured With [employer], 4.5x3.1 (in/yd) Discharge Instruction: Secure with Kerlix as directed. 37M Medipore H Soft Cloth Surgical T ape, 4 x 10 (in/yd) Discharge Instruction: Secure with tape as directed. Compression Wrap Compression Stockings Add-Ons Electronic Signature(s) Signed: 04/14/2022 6:12:04 PM By: [name] RN, [name] Entered By: [name] on 04/14/2022 10:34:12 -------------------------------------------------------------------------------- Vitals Details Patient Name: Date of Service: [name], [name] 04/14/2022 9:45 [name] Medical Record Number: [id number] Patient Account Number: [id number] Date of Birth/Sex: Treating RN: [birth year] (66 y.o. M) Primary Care Zeke Aker: York Ram Other Clinician: Referring Eveline Sauve: Treating Grey Schlauch/Extender: [name]  Boneta Lucks Weeks in Treatment: 0 Vital Signs Time Taken: 09:22 Temperature (F): 98.6 Height (in): 72 Pulse (bpm): 99 Source: Stated Respiratory Rate (breaths/min): 18 Weight (lbs): 220 Blood Pressure (mmHg): 124/79 Source: Stated Capillary Blood Glucose (mg/dl): 162 Body Mass Index (BMI): 29.8 Reference Range: 80 - 120 mg / dl Electronic Signature(s) Signed: 04/14/2022 2:19:52 PM By: [name] Entered By: [name] on 04/14/2022 09:22:52

## 2022-04-23 ENCOUNTER — Encounter (HOSPITAL_BASED_OUTPATIENT_CLINIC_OR_DEPARTMENT_OTHER): Payer: Medicare HMO | Admitting: Internal Medicine

## 2022-04-23 DIAGNOSIS — L97522 Non-pressure chronic ulcer of other part of left foot with fat layer exposed: Secondary | ICD-10-CM | POA: Diagnosis not present

## 2022-04-23 DIAGNOSIS — E1151 Type 2 diabetes mellitus with diabetic peripheral angiopathy without gangrene: Secondary | ICD-10-CM | POA: Diagnosis not present

## 2022-04-24 ENCOUNTER — Ambulatory Visit (INDEPENDENT_AMBULATORY_CARE_PROVIDER_SITE_OTHER): Payer: Medicare HMO | Admitting: Podiatry

## 2022-04-24 DIAGNOSIS — E1142 Type 2 diabetes mellitus with diabetic polyneuropathy: Secondary | ICD-10-CM | POA: Diagnosis not present

## 2022-04-24 DIAGNOSIS — Z89422 Acquired absence of other left toe(s): Secondary | ICD-10-CM | POA: Diagnosis not present

## 2022-04-24 NOTE — Progress Notes (Signed)
[name], [name] [name] ([id number]) [id number] Page 1 of 10 Visit Report for 04/23/2022 Chief Complaint Document Details Patient Name: Date of Service: [name], [name] [name]. 04/23/2022 9:00 [name] Medical Record Number: [id number] Patient Account Number: [id number] Date of Birth/Sex: Treating RN: 24-Oct-1955 (66 y.o. [name] Primary Care Provider: York Ram Other Clinician: Referring Provider: Treating Provider/Extender: Coralee North Weeks in Treatment: 1 Information Obtained from: Patient Chief Complaint 10/16/20; patient is here with [name] substantial wound on his right foot in the setting of [name] recent transmetatarsal amputation. 04/14/2022; referral from podiatry for postsurgical wound of nonhealing partial left fifth ray amputation Electronic Signature(s) Signed: 04/23/2022 12:22:58 PM By: [name] DO Entered By: [name] on 04/23/2022 11:52:25 -------------------------------------------------------------------------------- Debridement Details Patient Name: Date of Service: [name], [name]. 04/23/2022 9:00 [name] Medical Record Number: [id number] Patient Account Number: [id number] Date of Birth/Sex: Treating RN: [birth year] (66 y.o. [name] Primary Care Provider: York Ram Other Clinician: Referring Provider: Treating Provider/Extender: Coralee North Weeks in Treatment: 1 Debridement Performed for Assessment: Wound #4 Left,Dorsal Foot Performed By: Physician [name], DO Debridement Type: Debridement Severity of Tissue Pre Debridement: Fat layer exposed Level of Consciousness (Pre-procedure): Awake and Alert Pre-procedure Verification/Time Out Yes - 11:30 Taken: Start Time: 11:31 Pain Control: Lidocaine 5% topical ointment T Area Debrided (L x W): otal 2.2 (cm) x 1.2 (cm) = 2.64 (cm) Tissue and other material debrided: Viable, Non-Viable, Slough, Subcutaneous,  Skin: Dermis , Skin: Epidermis, Slough Level: Skin/Subcutaneous Tissue Debridement Description: Excisional Instrument: Curette Bleeding: Minimum Hemostasis Achieved: Pressure End Time: 11:39 Procedural Pain: 0 Post Procedural Pain: 0 Response to Treatment: Procedure was tolerated well Level of Consciousness (Post- Awake and Alert procedure): Post Debridement Measurements of Total Wound Length: (cm) 2.2 Width: (cm) 1.2 Depth: (cm) 0.2 Volume: (cm) 0.415 Character of Wound/Ulcer Post Debridement: Requires Further Debridement [name], [name] [name] ([id number]) [id number] Page 2 of 10 Severity of Tissue Post Debridement: Fat layer exposed Post Procedure Diagnosis Same as Pre-procedure Electronic Signature(s) Signed: 04/23/2022 12:22:58 PM By: [name] DO Signed: 04/23/2022 6:08:05 PM By: [name] RN, [name] Entered By: [name] on 04/23/2022 11:42:58 -------------------------------------------------------------------------------- HPI Details Patient Name: Date of Service: [name], [name]. 04/23/2022 9:00 [name] Medical Record Number: [id number] Patient Account Number: [id number] Date of Birth/Sex: Treating RN: 03-28-1956 (66 y.o. [name] Primary Care Provider: York Ram Other Clinician: Referring Provider: Treating Provider/Extender: Coralee North Weeks in Treatment: 1 History of Present Illness HPI Description: ADMISSION 10/16/20 This is [name] 66 year old man who is [name] type II diabetic. Initially seen by Dr. Unk Lightning of vein and vascular on 09/10/2021 for bilateral lower extremity rest pain right greater than left. His ABIs demonstrated monophasic waveforms at the ankles bilaterally with depressed toe pressures. His ABI in the right was 0.5 and on the left 0.28. There were no obtainable waveforms for TBI's. He underwent angiography on 09/10/2021 and had findings of severe PAD with 95% stenosis of the distal  SFA. He also had peroneal and posterior tibial arteries occluded with significant collateralization in the calf there was reconstitution of the posterior tibial artery in the distal third of the calf. Ostia of the anterior tibial artery was not appreciated. He underwent [name] angioplasty of the superficial femoral artery. He required admission to hospital from 09/12/2021 through 09/22/2021 with right foot cellulitis and sepsis. On 3/13 he underwent [name] right below-knee popliteal to posterior tibial bypass using [name] greater  saphenous vein. Ultimately however he required [name] right TMA by podiatry which I believe was done on 3/17 for progressive diabetic foot infection. He was discharged to [facility] skilled facility. He arrives in clinic today with [name] large wound area over the entirety of his amputation site extending proximally. The attempted flap closure of the amputation site and his TMA has failed and the wound extends under the attempt to closure. Still some sutures in place. There is an odor but he is not systemically unwell. He is due for follow-up arterial studies and has an appointment with vascular surgery on 10/28/2021. They are using wet-to-dry dressings at [facility]. Past medical history includes type 2 diabetes with peripheral neuropathy, hypertension, obstructive sleep apnea 66; patient presents for follow-up. He is being discharged from his facility at [facility] today. They have been doing Dakin's wet-to-dry dressings. He states that his fiance can help with dressing changes. He is getting [facility] to come out 3 times [name] week once he leaves the facility. He is scheduled to see vein and vascular on 5/12. He has not seen the wound himself. He puts covers over his face for wound exams. He currently denies systemic signs of infection. 66; patient admitted to the clinic 2 weeks ago with [name] dehisced right TMA in the setting of type 2 diabetes with significant PAD. He is status  post revascularization. He has been discharged from the nursing home he was at and now is at home using Dakin's wet-to-dry dressings daily he has home health. He denies any systemic signs of infection 5/4; patient presents for follow-up. His fiance and home health have been changing his dressings. He currently denies systemic signs of infection. He continues to put sheet covers over his face during our wound encounters because he does not want [name] look at the wound. 5/25; Patient presents for follow-up. He missed his last clinic appointment. He had [name] bone biopsy and culture done at last clinic visit that showed acute osteomyelitis with growth of E. coli, stenotrophomonas maltophilia and enteroccocus faecalis. [name] referral to infectious disease was made. He has not heard from them yet. He has been using Dakin's wet-to-dry dressings. He is now more comfortable with looking at the wound bed. 6/6; the patient comes into clinic today with 2 wounds on the left foot which apparently have been there for several months. This was not known to assess but was known by Dr. Donzetta Matters. In fact he underwent an angiogram yesterday. He had [name] laser atherectomy of the left posterior tibial artery with [name] 3 mm balloon angioplasty, also [name] stent of the last left SFA. 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 This is [name] lot better than the last time I saw it. He has been using Dakin's wet-to-dry. He has an appointment with infectious disease for [name] bone biopsy which showed E. coli, stenotrophomonas and Enterococcus faecalis. The appointment is for tomorrow 04/14/2022 [name] is an uncontrolled type 2 diabetic on insulin that presents with [name] left foot ulcer. On 12/18/2021 he required [name] left fifth ray amputation. He had revision of this site on 6/17 by Dr. Posey Pronto,  podiatry. He states he has had [name] wound VAC on this area for the past 3 weeks. It is unclear what the prior wound care has been. He has [name]  history of osteomyelitis to this area and was treated with IV and oral antibiotics For 6 weeks by Dr. Linus Salmons of infectious disease. At his last follow-up on 02/20/2022 his CRP was trending down and antibiotics were stopped. He also has [name] history of peripheral arterial disease status post stenting to the left SFA and balloon angioplasty to the left posterior tibial artery On 12/2021. He has follow-up with vein and vascular at the end of the month. He also has [name] wound to the dorsal aspect of the left foot that he has been placing Betadine on. 10/19; patient presents for follow-up. He had [name] bone biopsy of the left foot at last clinic visit showed osteomyelitis. The bone culture showed Enterobacter cloacae, Enterococcus faecalis, Staphylococcus aureus, viridans group strep, actinotigum schaelii. He has been referred to infectious disease And has an appointment on October 25. He has been using Dakin's wet-to-dry dressing to the lateral left foot wound and Hydrofera Blue and Medihoney to the dorsal foot wound. He has no issues or complaints today. He denies systemic signs of infection. [name], [name] ([id number]) [id number] Page 3 of 10 Electronic Signature(s) Signed: 04/23/2022 12:22:58 PM By: [name] DO Entered By: [name] on 04/23/2022 12:05:36 -------------------------------------------------------------------------------- Physical Exam Details Patient Name: Date of Service: [name], [name]. 04/23/2022 9:00 [name] Medical Record Number: [id number] Patient Account Number: [id number] Date of Birth/Sex: Treating RN: 02-Apr-1956 (66 y.o. [name] Primary Care Provider: York Ram Other Clinician: Referring Provider: Treating Provider/Extender: Coralee North Weeks in Treatment:  1 Constitutional respirations regular, non-labored and within target range for patient.. Cardiovascular 2+ dorsalis pedis/posterior tibialis pulses. Psychiatric pleasant and cooperative. Notes Left foot: T the lateral aspect there is an open wound with granulation tissue and nonviable tissue and exposed bone. T the dorsal aspect there is an open o o wound with nonviable tissue and granulation tissue present. No signs of surrounding soft tissue infection. Electronic Signature(s) Signed: 04/23/2022 12:22:58 PM By: [name] DO Entered By: [name] on 04/23/2022 12:11:30 -------------------------------------------------------------------------------- Physician Orders Details Patient Name: Date of Service: [name], [name]. 04/23/2022 9:00 [name] Medical Record Number: [id number] Patient Account Number: [id number] Date of Birth/Sex: Treating RN: 08/24/1955 (66 y.o. [name] Primary Care Provider: York Ram Other Clinician: Referring Provider: Treating Provider/Extender: Coralee North Weeks in Treatment: 1 Verbal / Phone Orders: No Diagnosis Coding ICD-10 Coding Code Description [id number] 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 E11.621 Type 2 diabetes mellitus with foot ulcer E11.42 Type 2 diabetes mellitus with diabetic polyneuropathy Follow-up Appointments ppointment in 1 week. - Dr. Heber Sims Thursday Return [name] Other: - Ensure you go to all your appointments: Friday 04/24/2022 [address]45 Podiat[address] Posey Pronto Wednesday 04/29/2022 [address] infectious [address] Linus Salmons Friday 05/01/2022 1000 Vein and[address]ascular Dr. Samara Snide, Docia Barrier [name] ([id number]) [id number] Page 4 of 10 Anesthetic (In clinic) Topical Lidocaine 5% applied to wound bed Bathing/ Shower/ Hygiene May shower with protection  but do not get wound dressing(s) wet. Negative Presssure Wound Therapy Discontinue wound vac - Call [facility] to pick up wound vac. Edema Control - Lymphedema / SCD / Other Elevate legs to the level of the heart or above for 30 minutes daily and/or when sitting, [name] frequency of: - 3-4 times [name] day throughout the day. Avoid standing for long periods of time. Off-Loading Open toe surgical  shoe to: - wear when standing or walking. Home Health New wound care orders this week; continue Home Health for wound care. May utilize formulary equivalent dressing for wound treatment orders unless otherwise specified. - dakins wet to dry to left lateral foot. medihoney and hydrofera blue to dorsal foot. Home health x3 times [name] week, wound center weekly, and all other day family to change. Patient to continue using betadine to right foot as instructed by podiatry until next week and Dr. Heber Velda [place] will reevaluate the right foot wound. Dressing changes to be completed by [place] on Monday / Wednesday / Friday except when patient has scheduled visit at [facility]. Other Home Health Orders/Instructions: - [facility]. Wound Treatment Wound #4 - Foot Wound Laterality: Dorsal, Left Cleanser: Wound Cleanser (Home Health) 1 x Per Day/30 Days Discharge Instructions: Cleanse the wound with wound cleanser prior to applying [name] clean dressing using gauze sponges, not tissue or cotton balls. Peri-Wound Care: Skin Prep (Home Health) 1 x Per Day/30 Days Discharge Instructions: Use skin prep as directed Prim Dressing: Hydrofera Blue Ready Foam, 4x5 in (Home Health) 1 x Per Day/30 Days ary Discharge Instructions: Apply to wound bed as instructed Prim Dressing: MediHoney Gel, tube 1.5 (oz) ([place]) 1 x Per Day/30 Days ary Discharge Instructions: Apply to wound bed as instructed Secondary Dressing: ABD Pad, 8x10 ([place]) 1 x Per Day/30 Days Discharge Instructions: Apply over primary dressing as  directed. Secured With: [employer], 4.5x3.1 (in/yd) (Home Health) 1 x Per Day/30 Days Discharge Instructions: Secure with Kerlix as directed. Secured With: 35M Medipore H Soft Cloth Surgical T ape, 4 x 10 (in/yd) (Home Health) 1 x Per Day/30 Days Discharge Instructions: Secure with tape as directed. Wound #5 - Foot Wound Laterality: Left, Lateral Cleanser: Wound Cleanser (Home Health) 1 x Per Day/30 Days Discharge Instructions: Cleanse the wound with wound cleanser prior to applying [name] clean dressing using gauze sponges, not tissue or cotton balls. Prim Dressing: Dakin's Solution 0.25%, 16 (oz) (Home Health) 1 x Per Day/30 Days ary Discharge Instructions: Moisten gauze with Dakin's solution Secondary Dressing: ABD Pad, 8x10 (Home Health) 1 x Per Day/30 Days Discharge Instructions: Apply over primary dressing as directed. Secured With: [employer], 4.5x3.1 (in/yd) (Home Health) 1 x Per Day/30 Days Discharge Instructions: Secure with Kerlix as directed. Secured With: 35M Medipore H Soft Cloth Surgical T ape, 4 x 10 (in/yd) (Home Health) 1 x Per Day/30 Days Discharge Instructions: Secure with tape as directed. Electronic Signature(s) Signed: 04/23/2022 12:22:58 PM By: [name] DO Entered By: [name] on 04/23/2022 12:12:21 [name] ([id number]) [id number] Page 5 of 10 -------------------------------------------------------------------------------- Problem List Details Patient Name: Date of Service: [name], [name] [name]. 04/23/2022 9:00 [name] Medical Record Number: [id number] Patient Account Number: [id number] Date of Birth/Sex: Treating RN: April 28, 1956 (66 y.o. [name] Primary Care Provider: York Ram Other Clinician: Referring Provider: Treating Provider/Extender: Coralee North Weeks in Treatment: 1 Active Problems ICD-10 Encounter Code Description Active Date MDM Diagnosis [phone]  Non-pressure chronic ulcer of other part of left foot with fat layer exposed 04/14/2022 No Yes L97.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 specified 04/14/2022 No Yes severity E11.621 Type 2 diabetes mellitus with foot ulcer 04/14/2022 No Yes E11.42 Type 2 diabetes mellitus with diabetic polyneuropathy 04/14/2022 No Yes Inactive Problems Resolved Problems Electronic Signature(s) Signed: 04/23/2022 12:22:58 PM By: [name] DO Entered By: [name] on 04/23/2022  11:51:15 -------------------------------------------------------------------------------- Progress Note Details Patient Name: Date of Service: [name], [name] [name]. 04/23/2022 9:00 [name] Medical Record Number: [id number] Patient Account Number: [id number] Date of Birth/Sex: Treating RN: [birth year] (66 y.o. [name] Primary Care Provider: York Ram Other Clinician: Referring Provider: Treating Provider/Extender: Coralee North Weeks in Treatment: 1 Subjective Chief Complaint Information obtained from Patient 10/16/20; patient is here with [name] substantial wound on his right foot in the setting of [name] recent transmetatarsal amputation. 04/14/2022; referral from podiatry for postsurgical wound of nonhealing partial left fifth ray amputation History of Present Illness (HPI) ADMISSION 10/16/20 This is [name] 66 year old man who is [name] type II diabetic. Initially seen by Dr. Unk Lightning of vein and vascular on 09/10/2021 for bilateral lower extremity rest pain right greater than left. His ABIs demonstrated monophasic waveforms at the ankles bilaterally with depressed toe pressures. His ABI in the right was 0.5 and on the left 0.28. There were no obtainable waveforms for TBI's. He underwent angiography on 09/10/2021 and had findings of severe PAD with 95% stenosis of the distal SFA. He also had peroneal and posterior tibial arteries occluded with significant collateralization in the  calf there was reconstitution of the posterior tibial artery in the distal third of the calf. Ostia of the anterior tibial artery was not appreciated. He underwent [name] angioplasty of the superficial femoral artery. He required admission to hospital from 09/12/2021 through 09/22/2021 with right foot cellulitis and sepsis. On 3/13 he underwent [name] right below-knee popliteal to posterior tibial bypass using [name] greater saphenous vein. Ultimately however he required [name] right TMA by podiatry which I believe was done on 3/17 for [name], [name] ([id number]) [id number] Page 6 of 10 progressive diabetic foot infection. He was discharged to [facility] skilled facility. He arrives in clinic today with [name] large wound area over the entirety of his amputation site extending proximally. The attempted flap closure of the amputation site and his TMA has failed and the wound extends under the attempt to closure. Still some sutures in place. There is an odor but he is not systemically unwell. He is due for follow-up arterial studies and has an appointment with vascular surgery on 10/28/2021. They are using wet-to-dry dressings at [facility]. Past medical history includes type 2 diabetes with peripheral neuropathy, hypertension, obstructive sleep apnea 66; patient presents for follow-up. He is being discharged from his facility at [facility] today. They have been doing Dakin's wet-to-dry dressings. He states that his fiance can help with dressing changes. He is getting [facility] home [facility] to come out 3 times [name] week once he leaves the facility. He is scheduled to see vein and vascular on 5/12. He has not seen the wound himself. He puts covers over his face for wound exams. He currently denies systemic signs of infection. 66; patient admitted to the clinic 2 weeks ago with [name] dehisced right TMA in the setting of type 2 diabetes with significant PAD. He is status post revascularization. He has been  discharged from the nursing home he was at and now is at home using Dakin's wet-to-dry dressings daily he has home health. He denies any systemic signs of infection 5/4; patient presents for follow-up. His fiance and home health have been changing his dressings. He currently denies systemic signs of infection. He continues to put sheet covers over his face during our wound encounters because he does not want [name] look at the wound. 5/25; Patient presents for follow-up. He missed his last clinic appointment.  He had [name] bone biopsy and culture done at last clinic visit that showed acute osteomyelitis with growth of E. coli, stenotrophomonas maltophilia and enteroccocus faecalis. [name] referral to infectious disease was made. He has not heard from them yet. He has been using Dakin's wet-to-dry dressings. He is now more comfortable with looking at the wound bed. 6/6; the patient comes into clinic today with 2 wounds on the left foot which apparently have been there for several months. This was not known to assess but was known by Dr. Donzetta Matters. In fact he underwent an angiogram yesterday. He had [name] laser atherectomy of the left posterior tibial artery with [name] 3 mm balloon angioplasty, also [name] stent of the last left SFA. 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 This is [name] lot better than the last time I saw it. He has been using Dakin's wet-to-dry. He has an appointment with infectious disease for [name] bone biopsy which showed E. coli, stenotrophomonas and Enterococcus faecalis. The appointment is for tomorrow 04/14/2022 Mr. Yeshua Stryker [name] is an uncontrolled type 2 diabetic on insulin that presents with [name] left foot ulcer. On 12/18/2021 he required [name] left fifth ray amputation. He had revision of this site on 6/17 by Dr. Posey Pronto, podiatry. He states he has had [name] wound  VAC on this area for the past 3 weeks. It is unclear what the prior wound care has been. He has [name] history of osteomyelitis to this area and was treated with IV and oral antibiotics For 6 weeks by Dr. Linus Salmons of infectious disease. At his last follow-up on 02/20/2022 his CRP was trending down and antibiotics were stopped. He also has [name] history of peripheral arterial disease status post stenting to the left SFA and balloon angioplasty to the left posterior tibial artery On 12/2021. He has follow-up with vein and vascular at the end of the month. He also has [name] wound to the dorsal aspect of the left foot that he has been placing Betadine on. 10/19; patient presents for follow-up. He had [name] bone biopsy of the left foot at last clinic visit showed osteomyelitis. The bone culture showed Enterobacter cloacae, Enterococcus faecalis, Staphylococcus aureus, viridans group strep, actinotigum schaelii. He has been referred to infectious disease And has an appointment on October 25. He has been using Dakin's wet-to-dry dressing to the lateral left foot wound and Hydrofera Blue and Medihoney to the dorsal foot wound. He has no issues or complaints today. He denies systemic signs of infection. Patient History Information obtained from Patient, Chart. Family History Diabetes - Mother,Siblings, Heart Disease - Siblings, Hypertension - Siblings, Stroke - Mother, No family history of Cancer, Hereditary Spherocytosis, Kidney Disease, Lung Disease, Seizures, Thyroid Problems, Tuberculosis. Social History Former smoker, Marital Status - Single, Alcohol Use - Never, Drug Use - No History, Caffeine Use - Daily. Medical History Eyes Denies history of Cataracts, Glaucoma, Optic Neuritis Respiratory Patient has history of Asthma, Sleep Apnea Denies history of Aspiration, Chronic Obstructive Pulmonary Disease (COPD), Pneumothorax, Tuberculosis Cardiovascular Patient has history of Hypertension, Peripheral Venous  Disease Denies history of Angina, Arrhythmia, Congestive Heart Failure, Coronary Artery Disease, Deep Vein Thrombosis, Hypotension, Myocardial Infarction, Peripheral Arterial Disease, Phlebitis, Vasculitis Endocrine Patient has history of Type II Diabetes Denies history of Type I Diabetes Integumentary (Skin) Denies history of History of Burn  Musculoskeletal Patient has history of Osteomyelitis Denies history of Gout, Rheumatoid Arthritis, Osteoarthritis Neurologic Patient has history of Neuropathy Denies history of Dementia, Quadriplegia, Paraplegia, Seizure Disorder Hospitalization/Surgery History - 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 Posey Pronto. - aortogram 12/08/2021 Dr. Virl Cagey [facility]. Medical [name] Surgical History Notes nd Psychiatric PTSD [name], [name] ([id number]) [id number] Page 7 of 10 Objective Constitutional respirations regular, non-labored and within target range for patient.. Vitals Time Taken: 10:45 AM, Height: 72 in, Weight: 220 lbs, BMI: 29.8, Temperature: 98 F, Pulse: 85 bpm, Respiratory Rate: 18 breaths/min, Blood Pressure: 151/90 mmHg. Cardiovascular 2+ dorsalis pedis/posterior tibialis pulses. Psychiatric pleasant and cooperative. General Notes: Left foot: T the lateral aspect there is an open wound with granulation tissue and nonviable tissue and exposed bone. T the dorsal aspect o o there is an open wound with nonviable tissue and granulation tissue present. No signs of surrounding soft tissue infection. Integumentary (Hair, Skin) Wound #4 status is Open. Original cause of wound was Gradually Appeared. The date acquired was: 09/03/2021. The wound has been in treatment 1 weeks. The wound is located on the Left,Dorsal Foot. The wound measures 2.2cm length x 1.2cm width x 0.2cm depth; 2.073cm^2 area and 0.415cm^3 volume. There is no tunneling or undermining noted. There is [name] medium amount of serosanguineous drainage  noted. The wound margin is distinct with the outline attached to the wound base. There is small (1-33%) granulation within the wound bed. There is [name] large (67-100%) amount of necrotic tissue within the wound bed including Eschar and Adherent Slough. The periwound skin appearance did not exhibit: Callus, Crepitus, Excoriation, Induration, Rash, Scarring, Dry/Scaly, Maceration, Atrophie Blanche, Cyanosis, Ecchymosis, Hemosiderin Staining, Mottled, Pallor, Rubor, Erythema. Periwound temperature was noted as No Abnormality. Wound #5 status is Open. Original cause of wound was Surgical Injury. The date acquired was: 12/20/2021. The wound has been in treatment 1 weeks. The wound is located on the Left,Lateral Foot. The wound measures 6.7cm length x 1.6cm width x 0.1cm depth; 8.419cm^2 area and 0.842cm^3 volume. There is bone and Fat Layer (Subcutaneous Tissue) exposed. There is no tunneling or undermining noted. There is [name] medium amount of serosanguineous drainage noted. The wound margin is epibole. There is large (67-100%) red, pink granulation within the wound bed. There is [name] small (1-33%) amount of necrotic tissue within the wound bed including Adherent Slough. The periwound skin appearance exhibited: Maceration. The periwound skin appearance did not exhibit: Callus, Crepitus, Excoriation, Induration, Rash, Scarring, Dry/Scaly, Atrophie Blanche, Cyanosis, Ecchymosis, Hemosiderin Staining, Mottled, Pallor, Rubor, Erythema. 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 I debrided nonviable tissue here and recommended continuing with Medihoney and Hydrofera Blue. The  left lateral foot has acute ostemyelitis from bone biopsy at last clinic visit. Patient has an appointment with infectious disease on 10/25 For further potential antibiotic treatment. For now he can continue Dakin's wet-to-dry dressings. Procedures Wound #4 Pre-procedure diagnosis of Wound #4 is [name] Diabetic Wound/Ulcer of the Lower Extremity located on the Left,Dorsal Foot .Severity of Tissue Pre Debridement is: Fat layer exposed. There was [name] Excisional Skin/Subcutaneous Tissue Debridement with [name] total area of 2.64 sq cm performed by [name], DO. With the following instrument(s): Curette to  remove Viable and Non-Viable tissue/material. Material removed includes Subcutaneous Tissue, Slough, Skin: Dermis, and Skin: Epidermis after achieving pain control using Lidocaine 5% topical ointment. [name] time out was conducted at 11:30, prior to the start of the procedure. [name] Minimum amount of bleeding was controlled with Pressure. The procedure was tolerated well with [name] pain level of 0 throughout and [name] pain level of 0 following the procedure. Post Debridement Measurements: 2.2cm length x 1.2cm width x 0.2cm depth; 0.415cm^3 volume. Character of Wound/Ulcer Post Debridement requires further debridement. Severity of Tissue Post Debridement is: Fat layer exposed. Post procedure Diagnosis Wound #4: Same as Pre-Procedure Plan Follow-up Appointments: Return Appointment in 1 week. - Dr. Heber Varina Thursday Other: - Ensure you go to all your appointments: Friday 04/24/2022 [place] Dr. Posey Pronto Wednesday 04/29/2022 [address] Linus Salmons Friday 05/01/2022 [address] [facility] [address]. Virl Cagey Anesthetic: (In clinic) Topical Lidocaine 5% applied to wound bed Bathing/ Shower/ Hygiene: May shower with protection but do not get wound dressing(s) wet. [name], [name] ([id number]) [id number] Page 8 of 10 Negative Presssure Wound Therapy: Discontinue wound vac - Call [facility]  to pick up wound vac. Edema Control - Lymphedema / SCD / Other: Elevate legs to the level of the heart or above for 30 minutes daily and/or when sitting, [name] frequency of: - 3-4 times [name] day throughout the day. Avoid standing for long periods of time. Off-Loading: Open toe surgical shoe to: - wear when standing or walking. Home Health: New wound care orders this week; continue Home Health for wound care. May utilize formulary equivalent dressing for wound treatment orders unless otherwise specified. - dakins wet to dry to left lateral foot. medihoney and hydrofera blue to dorsal foot. Home health x3 times [name] week, wound center weekly, and all other day family to change. Patient to continue using betadine to right foot as instructed by podiatry until next week and Dr. Heber Deale will reevaluate the right foot wound. Dressing changes to be completed by [facility] on Monday / Wednesday / Friday except when patient has scheduled visit at [facility]. Other Home Health Orders/Instructions: - [facility]. WOUND #4: - Foot Wound Laterality: Dorsal, Left Cleanser: Wound Cleanser (Home Health) 1 x Per Day/30 Days Discharge Instructions: Cleanse the wound with wound cleanser prior to applying [name] clean dressing using gauze sponges, not tissue or cotton balls. Peri-Wound Care: Skin Prep (Home Health) 1 x Per Day/30 Days Discharge Instructions: Use skin prep as directed Prim Dressing: Hydrofera Blue Ready Foam, 4x5 in (Home Health) 1 x Per Day/30 Days ary Discharge Instructions: Apply to wound bed as instructed Prim Dressing: MediHoney Gel, tube 1.5 (oz) ([place]) 1 x Per Day/30 Days ary Discharge Instructions: Apply to wound bed as instructed Secondary Dressing: ABD Pad, 8x10 ([place]) 1 x Per Day/30 Days Discharge Instructions: Apply over primary dressing as directed. Secured With: [employer], 4.5x3.1 (in/yd) (Home Health) 1 x Per Day/30 Days Discharge Instructions: Secure with  Kerlix as directed. Secured With: 4M Medipore H Soft Cloth Surgical T ape, 4 x 10 (in/yd) (Home Health) 1 x Per Day/30 Days Discharge Instructions: Secure with tape as directed. WOUND #5: - Foot Wound Laterality: Left, Lateral Cleanser: Wound Cleanser (Home Health) 1 x Per Day/30 Days Discharge Instructions: Cleanse the wound with wound cleanser prior to applying [name] clean dressing using gauze sponges, not tissue or cotton balls. Prim Dressing: Dakin's Solution 0.25%, 16 (oz) (Home Health) 1 x Per Day/30 Days ary Discharge Instructions: Moisten  gauze with Dakin's solution Secondary Dressing: ABD Pad, 8x10 (Home Health) 1 x Per Day/30 Days Discharge Instructions: Apply over primary dressing as directed. Secured With: [employer], 4.5x3.1 (in/yd) (Home Health) 1 x Per Day/30 Days Discharge Instructions: Secure with Kerlix as directed. Secured With: 14M Medipore H Soft Cloth Surgical T ape, 4 x 10 (in/yd) (Home Health) 1 x Per Day/30 Days Discharge Instructions: Secure with tape as directed. 1. In office sharp debridement 2. Hydrofera Blue and Medihoney to the dorsal left foot 3. Dakin's wet-to-dry dressings to the left lateral foot 4. Follow-up with infectious disease on October 25 5. Follow-up in 1 week Electronic Signature(s) Signed: 04/23/2022 12:22:58 PM By: [name] DO Entered By: [name] on 04/23/2022 12:21:55 -------------------------------------------------------------------------------- HxROS Details Patient Name: Date of Service: [name] [name], [name]. 04/23/2022 9:00 [name] Medical Record Number: [id number] Patient Account Number: [id number] Date of Birth/Sex: Treating RN: 16-Sep-1955 (66 y.o. [name] Primary Care Provider: York Ram Other Clinician: Referring Provider: Treating Provider/Extender: Coralee North Weeks in Treatment: 1 Information Obtained From Patient Chart Eyes Medical History: Negative for: Cataracts;  Glaucoma; Optic Neuritis Respiratory Medical History: Positive for: Asthma; Sleep Apnea Negative for: Aspiration; Chronic Obstructive Pulmonary Disease (COPD); Pneumothorax; Tuberculosis [name], [name] ([id number]) [id number] Page 9 of 10 Cardiovascular Medical History: Positive for: Hypertension; Peripheral Venous Disease Negative for: Angina; Arrhythmia; Congestive Heart Failure; Coronary Artery Disease; Deep Vein Thrombosis; Hypotension; Myocardial Infarction; Peripheral Arterial Disease; Phlebitis; Vasculitis Endocrine Medical History: Positive for: Type II Diabetes Negative for: Type I Diabetes Time with diabetes: 20 years Treated with: Insulin, Oral agents Blood sugar tested every day: Yes Tested : 4x day Integumentary (Skin) Medical History: Negative for: History of Burn Musculoskeletal Medical History: Positive for: Osteomyelitis Negative for: Gout; Rheumatoid Arthritis; Osteoarthritis Neurologic Medical History: Positive for: Neuropathy Negative for: Dementia; Quadriplegia; Paraplegia; Seizure Disorder Psychiatric Medical History: Past Medical History Notes: PTSD Immunizations Pneumococcal Vaccine: Received Pneumococcal Vaccination: No Implantable Devices None Hospitalization / Surgery History Type of Hospitalization/Surgery 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 Posey Pronto aortogram 12/08/2021 Dr. Virl Cagey [facility] Family and Social History Cancer: No; Diabetes: Yes - Mother,Siblings; Heart Disease: Yes - Siblings; Hereditary Spherocytosis: No; Hypertension: Yes - Siblings; Kidney Disease: No; Lung Disease: No; Seizures: No; Stroke: Yes - Mother; Thyroid Problems: No; Tuberculosis: No; Former smoker; Marital Status - Single; Alcohol Use: Never; Drug Use: No History; Caffeine Use: Daily; Financial Concerns: No; Food, Clothing or Shelter Needs: No; Support System Lacking: No; Transportation Concerns: No Electronic  Signature(s) Signed: 04/23/2022 12:22:58 PM By: [name] DO Signed: 04/23/2022 6:08:05 PM By: [name] RN, [name] Entered By: [name] on 04/23/2022 12:05:42 -------------------------------------------------------------------------------- SuperBill Details Patient Name: Date of Service: [name], [place] [name] [name]. 04/23/2022 Medical Record Number: [id number] Patient Account Number: [id number] [name], [name] ([id number]) [id number] Page 10 of 10 Date of Birth/Sex: Treating RN: 17-Jan-1956 (66 y.o. [name] Primary Care Provider: York Ram Other Clinician: Referring Provider: Treating Provider/Extender: Coralee North Weeks in Treatment: 1 Diagnosis Coding ICD-10 Codes Code Description [phone] 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 E11.621 Type 2 diabetes mellitus with foot ulcer E11.42 Type 2 diabetes mellitus with diabetic polyneuropathy Facility Procedures : CPT4 Code: [id number] Description: 30076 - DEB SUBQ TISSUE 20 SQ CM/< ICD-10 Diagnosis Description L97.522 Non-pressure chronic ulcer of other part of left foot  with fat layer exposed Modifier: Quantity: 1 Physician Procedures : CPT4 Code Description Modifier [id number] - WC PHYS SUBQ TISS 20 SQ CM ICD-10 Diagnosis Description L97.522 Non-pressure chronic ulcer of other part of left foot with fat layer exposed Quantity: 1 Electronic Signature(s) Signed: 04/23/2022 12:22:58 PM By: [name] DO Entered By: [name] on 04/23/2022 12:22:09

## 2022-04-24 NOTE — Progress Notes (Signed)
ESAI, STECKLEIN [name] ([id number]) [id number] Page 1 of 10 Visit Report for 04/23/2022 Arrival Information Details Patient Name: Date of Service: [name], [name]. 04/23/2022 9:00 [name] Medical Record Number: [id number] Patient Account Number: [id number] Date of Birth/Sex: Treating RN: 10-09-1955 (66 y.o. [name], [name] Primary Care Jenevie Casstevens: York Ram Other Clinician: Referring Abubakr Wieman: Treating Roylee Chaffin/Extender: Coralee North Weeks in Treatment: 1 Visit Information History Since Last Visit Added or deleted any medications: No Patient Arrived: Wheel Chair Any new allergies or adverse reactions: No Arrival Time: 10:45 Had [name] fall or experienced change in No Accompanied By: Friend activities of daily living that may affect Transfer Assistance: None risk of falls: Patient Requires Transmission-Based Precautions: No Signs or symptoms of abuse/neglect since last visito No Patient Has Alerts: No Hospitalized since last visit: No Implantable device outside of the clinic excluding No cellular tissue based products placed in the center since last visit: Has Dressing in Place as Prescribed: Yes Pain Present Now: No Electronic Signature(s) Signed: 04/23/2022 4:30:06 PM By: [name] Entered By: [name] on 04/23/2022 11:00:23 -------------------------------------------------------------------------------- Encounter Discharge Information Details Patient Name: Date of Service: [name], [name]. 04/23/2022 9:00 [name] Medical Record Number: [id number] Patient Account Number: [id number] Date of Birth/Sex: Treating RN: [birth year] (66 y.o. [name] Primary Care Harve Spradley: York Ram Other Clinician: Referring Pang Robers: Treating [name]/Extender: Coralee North Weeks in Treatment: 1 Encounter Discharge Information Items Post Procedure Vitals Discharge Condition: Stable Temperature (F):  98 Ambulatory Status: Ambulatory Pulse (bpm): 85 Discharge Destination: Home Respiratory Rate (breaths/min): 18 Transportation: Private Auto Blood Pressure (mmHg): 151/90 Accompanied By: family Schedule Follow-up Appointment: Yes Clinical Summary of Care: Electronic Signature(s) Signed: 04/23/2022 6:08:05 PM By: [name] RN, [name] Entered By: [name] on 04/23/2022 11:49:27 [name] ([id number]) [id number] Page 2 of 10 -------------------------------------------------------------------------------- Lower Extremity Assessment Details Patient Name: Date of Service: [name], [name]. 04/23/2022 9:00 [name] Medical Record Number: [id number] Patient Account Number: [id number] Date of Birth/Sex: Treating RN: January 12, 1956 (66 y.o. [name] Primary Care Adrianne Shackleton: York Ram Other Clinician: Referring Katha Kuehne: Treating Mehdi Gironda/Extender: Coralee North Weeks in Treatment: 1 Edema Assessment Assessed: [Left: No] [Right: No] Edema: [Left: Ye] [Right: s] Calf Left: Right: Point of Measurement: 36 cm From Medial Instep 37.1 cm Ankle Left: Right: Point of Measurement: 9 cm From Medial Instep 24.5 cm Electronic Signature(s) Signed: 04/23/2022 4:30:06 PM By: [name] Signed: 04/23/2022 6:08:05 PM By: [name] RN, [name] Entered By: [name] on 04/23/2022 11:01:01 -------------------------------------------------------------------------------- Multi Wound Chart Details Patient Name: Date of Service: [name], [name]. 04/23/2022 9:00 [name] Medical Record Number: [id number] Patient Account Number: [id number] Date of Birth/Sex: Treating RN: 09-14-1955 (66 y.o. [name] Primary Care Katrece Roediger: York Ram Other Clinician: Referring Nikhita Mentzel: Treating Janayia Burggraf/Extender: Coralee North Weeks in Treatment: 1 Vital Signs Height(in): 72 Pulse(bpm): 85 Weight(lbs):  220 Blood Pressure(mmHg): 151/90 Body Mass Index(BMI): 29.8 Temperature(F): 98 Respiratory Rate(breaths/min): 18 [4:Photos:] [[name]:[name]] Left, Dorsal Foot Left, Lateral Foot [name] Wound Location: Gradually Appeared Surgical Injury [name] Wounding Event: Diabetic Wound/Ulcer of the Lower Diabetic Wound/Ulcer of the Lower [name] Primary Etiology: Extremity Extremity [name] Open Surgical Wound [name] Secondary Etiology: [name], [name] ([id number]) [id number] Page 3 of 10 Asthma, Sleep Apnea, Hypertension, Asthma, Sleep Apnea, Hypertension, [name] Comorbid History: Peripheral Venous Disease, Type II Peripheral Venous Disease, Type II Diabetes, Osteomyelitis, Neuropathy Diabetes, Osteomyelitis, Neuropathy 09/03/2021 12/20/2021 [name] Date Acquired: 1  1 [name] Weeks of Treatment: Open Open [name] Wound Status: No No [name] Wound Recurrence: 2.2x1.2x0.2 6.7x1.6x0.1 [name] Measurements L x W x D (cm) 2.073 8.419 [name] [name] (cm) : rea 0.415 0.842 [name] Volume (cm) : 12.00% 15.80% [name] % Reduction in [name] rea: 11.90% 83.20% [name] % Reduction in Volume: Grade 3 Grade 3 [name] Classification: Medium Medium [name] Exudate [name] mount: Serosanguineous Serosanguineous [name] Exudate Type: red, brown red, brown [name] Exudate Color: Distinct, outline attached Epibole [name] Wound Margin: Small (1-33%) Large (67-100%) [name] Granulation [name] mount: [name] Red, Pink [name] Granulation Quality: Large (67-100%) Small (1-33%) [name] Necrotic [name] mount: Eschar, Adherent Slough Adherent Slough [name] Necrotic Tissue: Fascia: No Fat Layer (Subcutaneous Tissue): Yes [name] Exposed Structures: Fat Layer (Subcutaneous Tissue): No Bone: Yes Tendon: No Fascia: No Muscle: No Tendon: No Joint: No Muscle: No Bone: No Joint: No None Small (1-33%) [name] Epithelialization: Debridement - Excisional [name] [name] Debridement: Pre-procedure Verification/Time Out 11:30 [name] [name] Taken: Lidocaine 5% topical ointment [name] [name] Pain  Control: Subcutaneous, Slough [name] [name] Tissue Debrided: Skin/Subcutaneous Tissue [name] [name] Level: 2.64 [name] [name] Debridement [name] (sq cm): rea Curette [name] [name] Instrument: Minimum [name] [name] Bleeding: Pressure [name] [name] Hemostasis [name] chieved: 0 [name] [name] Procedural Pain: 0 [name] [name] Post Procedural Pain: Procedure was tolerated well [name] [name] Debridement Treatment Response: 2.2x1.2x0.2 [name] [name] Post Debridement Measurements L x W x D (cm) 0.415 [name] [name] Post Debridement Volume: (cm) Excoriation: No Excoriation: No [name] Periwound Skin Texture: Induration: No Induration: No Callus: No Callus: No Crepitus: No Crepitus: No Rash: No Rash: No Scarring: No Scarring: No Maceration: No Maceration: Yes [name] Periwound Skin Moisture: Dry/Scaly: No Dry/Scaly: No Atrophie Blanche: No Atrophie Blanche: No [name] Periwound Skin Color: Cyanosis: No Cyanosis: No Ecchymosis: No Ecchymosis: No Erythema: No Erythema: No Hemosiderin Staining: No Hemosiderin Staining: No Mottled: No Mottled: No Pallor: No Pallor: No Rubor: No Rubor: No No Abnormality [name] [name] Temperature: Debridement [name] [name] Procedures Performed: Treatment Notes Wound #4 (Foot) Wound Laterality: Dorsal, Left Cleanser Wound Cleanser Discharge Instruction: Cleanse the wound with wound cleanser prior to applying [name] clean dressing using gauze sponges, not tissue or cotton balls. Peri-Wound Care Skin Prep Discharge Instruction: Use skin prep as directed Topical Primary Dressing Hydrofera Blue Ready Foam, 4x5 in Discharge Instruction: Apply to wound bed as instructed MediHoney Gel, tube 1.5 (oz) Discharge Instruction: Apply to wound bed as instructed [name], [name] ([id number]) [phone].pdf Page 4 of 10 Secondary Dressing ABD Pad, 8x10 Discharge Instruction: Apply over primary dressing as directed. Secured With [employer], 4.5x3.1 (in/yd) Discharge Instruction: Secure with Kerlix as  directed. 81M Medipore H Soft Cloth Surgical T ape, 4 x 10 (in/yd) Discharge Instruction: Secure with tape as directed. Compression Wrap Compression Stockings Add-Ons Wound #5 (Foot) Wound Laterality: Left, Lateral Cleanser Wound Cleanser Discharge Instruction: Cleanse the wound with wound cleanser prior to applying [name] clean dressing using gauze sponges, not tissue or cotton balls. Peri-Wound Care Topical Primary Dressing Dakin's Solution 0.25%, 16 (oz) Discharge Instruction: Moisten gauze with Dakin's solution Secondary Dressing ABD Pad, 8x10 Discharge Instruction: Apply over primary dressing as directed. Secured With [employer], 4.5x3.1 (in/yd) Discharge Instruction: Secure with Kerlix as directed. 81M Medipore H Soft Cloth Surgical T ape, 4 x 10 (in/yd) Discharge Instruction: Secure with tape as directed. Compression Wrap Compression Stockings Add-Ons Electronic Signature(s) Signed: 04/23/2022 12:22:58 PM By: [name] DO Signed: 04/23/2022 6:08:05 PM By: [name] RN, [name] Entered By: [name] on 04/23/2022 11:51:26 -------------------------------------------------------------------------------- Multi-Disciplinary  Care Plan Details Patient Name: Date of Service: [name], [name]. 04/23/2022 9:00 [name] Medical Record Number: [id number] Patient Account Number: [id number] Date of Birth/Sex: Treating RN: 06-01-1956 (66 y.o. [name] Primary Care Rahkim Rabalais: York Ram Other Clinician: Referring Kortny Lirette: Treating Shanine Kreiger/Extender: Coralee North Weeks in Treatment: 1 Active Inactive Osteomyelitis DONNY, HEFFERN [name] ([id number]) [id number] Page 5 of 10 Nursing Diagnoses: Infection: osteomyelitis Goals: Diagnostic evaluation for osteomyelitis completed as ordered Date Initiated: 04/23/2022 Target Resolution Date: 05/08/2022 Goal Status: Active Signs and symptoms for osteomyelitis will be  recognized and promptly addressed Date Initiated: 04/23/2022 Target Resolution Date: 05/07/2022 Goal Status: Active Interventions: Assess for signs and symptoms of osteomyelitis resolution every visit Provide education on osteomyelitis Screen for HBO Treatment Activities: Biopsy : 04/16/2022 Consult for HBO : 04/23/2022 Surgical debridement : 04/23/2022 Systemic antibiotics : 04/23/2022 T ordered outside of clinic : 04/23/2022 est Notes: Wound/Skin Impairment Nursing Diagnoses: Knowledge deficit related to ulceration/compromised skin integrity Goals: Patient/caregiver will verbalize understanding of skin care regimen Date Initiated: 04/14/2022 Target Resolution Date: 05/08/2022 Goal Status: Active Interventions: Assess patient/caregiver ability to perform ulcer/skin care regimen upon admission and as needed Assess ulceration(s) every visit Provide education on ulcer and skin care Notes: Electronic Signature(s) Signed: 04/23/2022 6:08:05 PM By: [name] RN, [name] Entered By: [name] on 04/23/2022 11:37:36 -------------------------------------------------------------------------------- Pain Assessment Details Patient Name: Date of Service: [name], [name]. 04/23/2022 9:00 [name] Medical Record Number: [id number] Patient Account Number: [id number] Date of Birth/Sex: Treating RN: [birth year] (66 y.o. [name] Primary Care Tricia Oaxaca: York Ram Other Clinician: Referring Cayson Kalb: Treating Milanni Ayub/Extender: Coralee North Weeks in Treatment: 1 Active Problems Location of Pain Severity and Description of Pain Patient Has Paino No Site Locations [name], [name] ([id number]) [id number] Page 6 of 10 Pain Management and Medication Current Pain Management: Electronic Signature(s) Signed: 04/23/2022 4:30:06 PM By: [name] Signed: 04/23/2022 6:08:05 PM By: [name] RN, [name] Entered By: [name] on 04/23/2022 11:00:35 -------------------------------------------------------------------------------- Patient/Caregiver Education Details Patient Name: Date of Service: [name], [name]. 10/19/2023andnbsp9:00 [name] Medical Record Number: [id number] Patient Account Number: [id number] Date of Birth/Gender: Treating RN: Sep 25, 1955 (66 y.o. [name] Primary Care Physician: York Ram Other Clinician: Referring Physician: Treating Physician/Extender: Dimas Aguas in Treatment: 1 Education Assessment Education Provided To: Patient Education Topics Provided Wound/Skin Impairment: Handouts: Skin Care Do's and Dont's Methods: Explain/Verbal Responses: Reinforcements needed Electronic Signature(s) Signed: 04/23/2022 6:08:05 PM By: [name] RN, [name] Entered By: [name] on 04/23/2022 11:38:19 -------------------------------------------------------------------------------- Wound Assessment Details Patient Name: Date of Service: [name], [name]. 04/23/2022 9:00 [name] Medical Record Number: [id number] Patient Account Number: [id number] Date of Birth/Sex: Treating RN: September 09, 1955 (66 y.o. [name], [name] ([id number]) [id number] Page 7 of 10 Primary Care Shelia Kingsberry: York Ram Other Clinician: Referring Ryson Bacha: Treating [name]/Extender: Coralee North Weeks in Treatment: 1 Wound Status Wound Number: 4 Primary Diabetic Wound/Ulcer of the Lower Extremity Etiology: Wound Location: Left, Dorsal Foot Wound Open Wounding Event: Gradually Appeared Status: Date Acquired: 09/03/2021 Comorbid Asthma, Sleep Apnea, Hypertension, Peripheral Venous Disease, Weeks Of Treatment: 1 History: Type II Diabetes, Osteomyelitis, Neuropathy Clustered Wound: No Photos Wound Measurements Length: (cm) 2.2 Width: (cm) 1.2 Depth: (cm) 0.2 Area: (cm) 2.073 Volume: (cm)  0.415 % Reduction in Area: 12% % Reduction in Volume: 11.9% Epithelialization: None Tunneling: No Undermining: No Wound Description Classification: Grade 3 Wound Margin: Distinct,  outline attached Exudate Amount: Medium Exudate Type: Serosanguineous Exudate Color: red, brown Foul Odor After Cleansing: No Slough/Fibrino Yes Wound Bed Granulation Amount: Small (1-33%) Exposed Structure Necrotic Amount: Large (67-100%) Fascia Exposed: No Necrotic Quality: Eschar, Adherent Slough Fat Layer (Subcutaneous Tissue) Exposed: No Tendon Exposed: No Muscle Exposed: No Joint Exposed: No Bone Exposed: No Periwound Skin Texture Texture Color No Abnormalities Noted: No No Abnormalities Noted: No Callus: No Atrophie Blanche: No Crepitus: No Cyanosis: No Excoriation: No Ecchymosis: No Induration: No Erythema: No Rash: No Hemosiderin Staining: No Scarring: No Mottled: No Pallor: No Moisture Rubor: No No Abnormalities Noted: No Dry / Scaly: No Temperature / Pain Maceration: No Temperature: No Abnormality Treatment Notes Wound #4 (Foot) Wound Laterality: Dorsal, Left Cleanser Wound Cleanser Discharge Instruction: Cleanse the wound with wound cleanser prior to applying [name] clean dressing using gauze sponges, not tissue or cotton balls. Peri-Wound Care Skin Prep [name], [name] ([id number]) [phone].pdf Page 8 of 10 Discharge Instruction: Use skin prep as directed Topical Primary Dressing Hydrofera Blue Ready Foam, 4x5 in Discharge Instruction: Apply to wound bed as instructed MediHoney Gel, tube 1.5 (oz) Discharge Instruction: Apply to wound bed as instructed Secondary Dressing ABD Pad, 8x10 Discharge Instruction: Apply over primary dressing as directed. Secured With [employer], 4.5x3.1 (in/yd) Discharge Instruction: Secure with Kerlix as directed. 8M Medipore H Soft Cloth Surgical T ape, 4 x 10 (in/yd) Discharge Instruction: Secure  with tape as directed. Compression Wrap Compression Stockings Add-Ons Electronic Signature(s) Signed: 04/23/2022 4:30:06 PM By: [name] Signed: 04/23/2022 6:08:05 PM By: [name] RN, [name] Entered By: [name] on 04/23/2022 11:03:24 -------------------------------------------------------------------------------- Wound Assessment Details Patient Name: Date of Service: [name], [name]. 04/23/2022 9:00 [name] Medical Record Number: [id number] Patient Account Number: [id number] Date of Birth/Sex: Treating RN: 01-Dec-1955 (67 y.o. [name] Primary Care Traveon Louro: York Ram Other Clinician: Referring Keefe Zawistowski: Treating Elah Avellino/Extender: Coralee North Weeks in Treatment: 1 Wound Status Wound Number: 5 Primary Diabetic Wound/Ulcer of the Lower Extremity Etiology: Wound Location: Left, Lateral Foot Secondary Open Surgical Wound Wounding Event: Surgical Injury Etiology: Date Acquired: 12/20/2021 Wound Open Weeks Of Treatment: 1 Status: Clustered Wound: No Comorbid Asthma, Sleep Apnea, Hypertension, Peripheral Venous Disease, History: Type II Diabetes, Osteomyelitis, Neuropathy Photos Wound Measurements Length: (cm) 6.7 Width: (cm) 1.6 [name], [name] ([id number]) Depth: (cm) 0.1 Area: (cm) 8.419 Volume: (cm) 0.842 % Reduction in Area: 15.8% % Reduction in Volume: 83.2% [id number] Page 9 of 10 Epithelialization: Small (1-33%) Tunneling: No Undermining: No Wound Description Classification: Grade 3 Wound Margin: Epibole Exudate Amount: Medium Exudate Type: Serosanguineous Exudate Color: red, brown Foul Odor After Cleansing: No Slough/Fibrino Yes Wound Bed Granulation Amount: Large (67-100%) Exposed Structure Granulation Quality: Red, Pink Fascia Exposed: No Necrotic Amount: Small (1-33%) Fat Layer (Subcutaneous Tissue) Exposed: Yes Necrotic Quality: Adherent Slough Tendon Exposed:  No Muscle Exposed: No Joint Exposed: No Bone Exposed: Yes Periwound Skin Texture Texture Color No Abnormalities Noted: No No Abnormalities Noted: No Callus: No Atrophie Blanche: No Crepitus: No Cyanosis: No Excoriation: No Ecchymosis: No Induration: No Erythema: No Rash: No Hemosiderin Staining: No Scarring: No Mottled: No Pallor: No Moisture Rubor: No No Abnormalities Noted: No Dry / Scaly: No Maceration: Yes Treatment Notes Wound #5 (Foot) Wound Laterality: Left, Lateral Cleanser Wound Cleanser Discharge Instruction: Cleanse the wound with wound cleanser prior to applying [name] clean dressing using gauze sponges, not tissue or cotton balls. Peri-Wound Care Topical Primary Dressing Dakin's Solution 0.25%, 16 (oz) Discharge Instruction:  Moisten gauze with Dakin's solution Secondary Dressing ABD Pad, 8x10 Discharge Instruction: Apply over primary dressing as directed. Secured With [employer], 4.5x3.1 (in/yd) Discharge Instruction: Secure with Kerlix as directed. 76M Medipore H Soft Cloth Surgical T ape, 4 x 10 (in/yd) Discharge Instruction: Secure with tape as directed. Compression Wrap Compression Stockings Add-Ons Electronic Signature(s) Signed: 04/23/2022 4:30:06 PM By: [name] Signed: 04/23/2022 6:08:05 PM By: [name] RN, [name] Entered By: [name] on 04/23/2022 11:02:04 [name] ([id number]) [id number] Page 10 of 10 -------------------------------------------------------------------------------- Vitals Details Patient Name: Date of Service: [name], [name]. 04/23/2022 9:00 [name] Medical Record Number: [id number] Patient Account Number: [id number] Date of Birth/Sex: Treating RN: 1956/03/11 (66 y.o. [name] Primary Care Jacarie Pate: York Ram Other Clinician: Referring Kacia Halley: Treating Ciani Rutten/Extender: Coralee North Weeks in Treatment: 1 Vital Signs Time  Taken: 10:45 Temperature (F): 98 Height (in): 72 Pulse (bpm): 85 Weight (lbs): 220 Respiratory Rate (breaths/min): 18 Body Mass Index (BMI): 29.8 Blood Pressure (mmHg): 151/90 Reference Range: 80 - 120 mg / dl Electronic Signature(s) Signed: 04/23/2022 4:30:06 PM By: [name] Entered By: [name] on 04/23/2022 10:45:27

## 2022-04-24 NOTE — Progress Notes (Signed)
Subjective:  Patient ID: [name], male    DOB: 09-10-1955,  MRN: [id number]  Chief Complaint  Patient presents with   Wound Check    DOS: 12/18/2021 Procedure:  Left partial fifth ray amputation packed open with left partial hallux amputation  66 y.o. male returns for post-op check.  Patient states he is doing okay.  He is being followed at the wound care center who is holding up the [facility] and is doing Dakin's wet-to-dry dressing  Review of Systems: Negative except as noted in the HPI. Denies N/V/F/Ch.  Past Medical History:  Diagnosis Date   Diabetes mellitus with peripheral vascular disease    Hypertension    Neuropathy    PTSD (post-traumatic stress disorder)    Sleep apnea     Current Outpatient Medications:    albuterol (VENTOLIN HFA) 108 (90 Base) MCG/ACT inhaler, Inhale 2 puffs into the lungs every 6 (six) hours as needed for wheezing or shortness of breath., Disp: 1 each, Rfl: 0   amLODipine (NORVASC) 5 MG tablet, Take 1 tablet (5 mg total) by mouth every morning., Disp: 30 tablet, Rfl: 0   aspirin EC 81 MG tablet, Take 1 tablet (81 mg total) by mouth daily. Swallow whole., Disp: 150 tablet, Rfl: 2   atorvastatin (LIPITOR) 80 MG tablet, Take 1 tablet (80 mg total) by mouth at bedtime., Disp: 30 tablet, Rfl: 0   buPROPion (WELLBUTRIN XL) 300 MG 24 hr tablet, Take 1 tablet (300 mg total) by mouth every morning., Disp: 30 tablet, Rfl: 0   cholecalciferol (VITAMIN D) 25 MCG (1000 UNIT) tablet, Take 1,000 Units by mouth daily., Disp: , Rfl:    clopidogrel (PLAVIX) 75 MG tablet, Take 1 tablet (75 mg total) by mouth daily., Disp: 30 tablet, Rfl: 11   empagliflozin (JARDIANCE) 25 MG TABS tablet, Take 1 tablet (25 mg total) by mouth daily., Disp: 30 tablet, Rfl: 0   fluticasone furoate-vilanterol (BREO ELLIPTA) 100-25 MCG/INH AEPB, Inhale 1 puff into the lungs daily., Disp: 60 each, Rfl: 5   gabapentin (NEURONTIN) 600 MG tablet, Take 600 mg by mouth 2 (two) times daily.,  Disp: , Rfl:    insulin aspart (NOVOLOG) 100 UNIT/ML injection, CBG 70 - 120: 0 units CBG 121 - 150: 1 unit CBG 151 - 200: 2 units CBG 201 - 250: 3 units CBG 251 - 300: 5 units CBG 301 - 350: 7 units CBG 351 - 400: 9 units, Disp: 10 mL, Rfl: 11   insulin glargine-yfgn (SEMGLEE) 100 UNIT/ML injection, Inject 0.2 mLs (20 Units total) into the skin 2 (two) times daily., Disp: 10 mL, Rfl: 11   lisinopril (ZESTRIL) 20 MG tablet, Take 20 mg by mouth daily., Disp: , Rfl:    lurasidone (LATUDA) 40 MG TABS tablet, Take 1 tablet (40 mg total) by mouth daily with supper., Disp: 30 tablet, Rfl: 0   meclizine (ANTIVERT) 25 MG tablet, Take 1 tablet (25 mg total) by mouth 3 (three) times daily as needed for dizziness., Disp: 30 tablet, Rfl: 0   metoprolol tartrate (LOPRESSOR) 50 MG tablet, Take 1 tablet (50 mg total) by mouth 2 (two) times daily., Disp: 60 tablet, Rfl: 0   OLANZapine (ZYPREXA) 2.5 MG tablet, Take 1 tablet (2.5 mg total) by mouth at bedtime., Disp: 30 tablet, Rfl: 0   omeprazole (PRILOSEC) 20 MG capsule, Take 1 capsule (20 mg total) by mouth at bedtime., Disp: 30 capsule, Rfl: 0   OXcarbazepine (TRILEPTAL) 150 MG tablet, Take 1 tablet (  150 mg total) by mouth 2 (two) times daily., Disp: 60 tablet, Rfl: 0   senna-docusate (SENOKOT-S) 8.6-50 MG tablet, Take 1 tablet by mouth at bedtime as needed for mild constipation., Disp: , Rfl:   Social History   Tobacco Use  Smoking Status Former   Types: Cigarettes   Passive exposure: Never  Smokeless Tobacco Never    Allergies  Allergen Reactions   Gemfibrozil Other (See Comments)    Doesn't know its been awhile   Oxycontin [Oxycodone Hcl] Other (See Comments)    Bad reaction "took me to a place I never wanna go again"   Pork-Derived Products    Simvastatin Other (See Comments)    Says he doesn't know its been awhile   Objective:  There were no vitals filed for this visit. There is no height or weight on file to calculate BMI. Constitutional  Well developed. Well nourished.  Vascular Foot warm and well perfused. Capillary refill normal to all digits.   Neurologic Normal speech. Oriented to person, place, and time. Epicritic sensation to light touch grossly present bilaterally.  Dermatologic Left partial fifth ray wound healing well.  Granular wound bed noted no exposure of bone noted. No malodor   Orthopedic: Tenderness to palpation noted about the surgical site.   Radiographs: None Assessment:   1. 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 Type 2 diabetes mellitus with peripheral neuropathy (HCC)       Plan:  Patient was evaluated and treated and all questions answered.  S/p foot surgery left -Progressing as expected post-operatively. -XR: See above -WB Status: Partial weightbearing to the heel in surgical shoe -Medications: None -Hallux wound has reepithelialized.  No signs of Deis is noted. -He has not been able to set up for [facility] on an outpatient setting.  Left lateral foot wound -The wound is measuring 5 cm x 2.5 cm x 0.3 cm.  It is granulation wound bed no signs of infection.   -I will defer further management to the wound care center.  At this time patient requires my assistance to for surgical amputation I will see him back again sooner however for now I will follow him from [place] I will see him back again in 2 weeks. -We will also planning on following with infectious disease for antibiotics as well No follow-ups on file.  Looking same.  Referred to the wound care center.  Continue Betadine wet-to-dry

## 2022-04-29 ENCOUNTER — Other Ambulatory Visit: Payer: Self-pay

## 2022-04-29 ENCOUNTER — Ambulatory Visit (INDEPENDENT_AMBULATORY_CARE_PROVIDER_SITE_OTHER): Payer: Medicare HMO | Admitting: Internal Medicine

## 2022-04-29 ENCOUNTER — Encounter: Payer: Self-pay | Admitting: Internal Medicine

## 2022-04-29 DIAGNOSIS — M8610 Other acute osteomyelitis, unspecified site: Secondary | ICD-10-CM

## 2022-04-29 MED ORDER — LEVOFLOXACIN 750 MG PO TABS: 750.0000 mg | ORAL_TABLET | Freq: Every day | ORAL | 0 refills | Status: DC

## 2022-04-29 MED ORDER — DOXYCYCLINE HYCLATE 100 MG PO TABS: 100.0000 mg | ORAL_TABLET | Freq: Two times a day (BID) | ORAL | 0 refills | Status: DC

## 2022-04-29 NOTE — Progress Notes (Signed)
Patient ID: MAXTON NOREEN, male   DOB: 1955/10/19, 66 y.o.   MRN: [id number]    [facility]      Reason for Consult:acute osteomyelitis    Referring Physician: Dr Magdalene River    Patient ID: [name], male    DOB: Jan 14, 1956, 66 y.o.   MRN: [id number]  HPI:   Mr Dibello is here for a new problem. I saw him previously in August of this year after an amputation of his 5th metatarsal and treated with antibiotics and infection resolved.  He has been getting wound care with the wound care clinic and had a VAC in place but there was new concern for infection and a bone biopsy was sent off to pathology which is c/w acute osteomyelitis.  No culture was sent on the bone though.  There was a wound swab culture sent with expected multiple organisms growing.     Past Medical History:  Diagnosis Date   Diabetes mellitus with peripheral vascular disease    Hypertension    Neuropathy    PTSD (post-traumatic stress disorder)    Sleep apnea     Prior to Admission medications   Medication Sig Start Date End Date Taking? Authorizing Provider  albuterol (VENTOLIN HFA) 108 (90 Base) MCG/ACT inhaler Inhale 2 puffs into the lungs every 6 (six) hours as needed for wheezing or shortness of breath. 10/22/21  Yes Medina-Vargas, Monina C, NP  amLODipine (NORVASC) 5 MG tablet Take 1 tablet (5 mg total) by mouth every morning. 10/22/21  Yes Medina-Vargas, Monina C, NP  aspirin EC 81 MG tablet Take 1 tablet (81 mg total) by mouth daily. Swallow whole. 09/10/21 09/10/22 Yes [name], MD  atorvastatin (LIPITOR) 80 MG tablet Take 1 tablet (80 mg total) by mouth at bedtime. 10/22/21  Yes Medina-Vargas, Monina C, NP  buPROPion (WELLBUTRIN XL) 300 MG 24 hr tablet Take 1 tablet (300 mg total) by mouth every morning. 10/22/21  Yes Medina-Vargas, Monina C, NP  cholecalciferol (VITAMIN D) 25 MCG (1000 UNIT) tablet Take 1,000 Units by mouth daily.   Yes [provider]  clopidogrel  (PLAVIX) 75 MG tablet Take 1 tablet (75 mg total) by mouth daily. 12/03/21 12/03/22 Yes [name], MD  doxycycline (VIBRA-TABS) 100 MG tablet Take 1 tablet (100 mg total) by mouth 2 (two) times daily. 04/29/22  Yes Daryl Quiros, Okey Regal, MD  empagliflozin (JARDIANCE) 25 MG TABS tablet Take 1 tablet (25 mg total) by mouth daily. 10/22/21  Yes Medina-Vargas, Monina C, NP  fluticasone furoate-vilanterol (BREO ELLIPTA) 100-25 MCG/INH AEPB Inhale 1 puff into the lungs daily. 02/11/21  Yes Chesley Mires, MD  gabapentin (NEURONTIN) 600 MG tablet Take 600 mg by mouth 2 (two) times daily. 02/21/21  Yes [provider]  insulin aspart (NOVOLOG) 100 UNIT/ML injection CBG 70 - 120: 0 units CBG 121 - 150: 1 unit CBG 151 - 200: 2 units CBG 201 - 250: 3 units CBG 251 - 300: 5 units CBG 301 - 350: 7 units CBG 351 - 400: 9 units 12/24/21  Yes Hosie Poisson, MD  insulin glargine-yfgn (SEMGLEE) 100 UNIT/ML injection Inject 0.2 mLs (20 Units total) into the skin 2 (two) times daily. 12/24/21  Yes Hosie Poisson, MD  levofloxacin (LEVAQUIN) 750 MG tablet Take 1 tablet (750 mg total) by mouth daily. 04/29/22  Yes Bailey Kolbe, Okey Regal, MD  lisinopril (ZESTRIL) 20 MG tablet Take 20 mg by mouth daily.   Yes [provider]  lurasidone (LATUDA) 40 MG TABS tablet Take 1 tablet (40 mg total) by mouth daily with supper. 10/22/21  Yes Medina-Vargas, Monina C, NP  meclizine (ANTIVERT) 25 MG tablet Take 1 tablet (25 mg total) by mouth 3 (three) times daily as needed for dizziness. 10/22/21  Yes Medina-Vargas, Monina C, NP  metoprolol tartrate (LOPRESSOR) 50 MG tablet Take 1 tablet (50 mg total) by mouth 2 (two) times daily. 10/22/21  Yes Medina-Vargas, Monina C, NP  OLANZapine (ZYPREXA) 2.5 MG tablet Take 1 tablet (2.5 mg total) by mouth at bedtime. 10/22/21  Yes Medina-Vargas, Monina C, NP  omeprazole (PRILOSEC) 20 MG capsule Take 1 capsule (20 mg total) by mouth at bedtime. 10/22/21  Yes Medina-Vargas, Monina C, NP  OXcarbazepine  (TRILEPTAL) 150 MG tablet Take 1 tablet (150 mg total) by mouth 2 (two) times daily. 10/22/21  Yes Medina-Vargas, Monina C, NP  senna-docusate (SENOKOT-S) 8.6-50 MG tablet Take 1 tablet by mouth at bedtime as needed for mild constipation. 12/24/21  Yes Hosie Poisson, MD    Allergies  Allergen Reactions   Gemfibrozil Other (See Comments)    Doesn't know its been awhile   Oxycontin [Oxycodone Hcl] Other (See Comments)    Bad reaction "took me to a place I never wanna go again"   Pork-Derived Products    Simvastatin Other (See Comments)    Says he doesn't know its been awhile    Social History   Tobacco Use   Smoking status: Former    Types: Cigarettes    Passive exposure: Never   Smokeless tobacco: Never  Vaping Use   Vaping Use: Never used  Substance Use Topics   Alcohol use: No   Drug use: Never    Family History  Problem Relation Age of Onset   Stroke Mother    Coronary artery disease Father    COPD Brother     Review of Systems  Constitutional: negative for fevers and chills All other systems reviewed and are negative    Constitutional: in no apparent distress  Vitals:   04/29/22 1010  BP: 121/71  Pulse: 88  Temp: 97.6 F (36.4 C)  SpO2: 97%   EYES: anicteric Respiratory: normal respiratory effort Musculoskeletal: foot wrapped Labs: Lab Results  Component Value Date   WBC 12.4 (H) 12/24/2021   HGB 11.6 (L) 12/24/2021   HCT 36.2 (L) 12/24/2021   MCV 77.8 (L) 12/24/2021   PLT 817 (H) 12/24/2021    Lab Results  Component Value Date   CREATININE 0.68 12/24/2021   BUN 13 12/24/2021   NA 135 12/24/2021   K 4.4 12/24/2021   CL 101 12/24/2021   CO2 24 12/24/2021    Lab Results  Component Value Date   ALT 22 12/14/2021   AST 21 12/14/2021   ALKPHOS 82 12/14/2021   BILITOT 0.6 12/14/2021     Assessment: acute osteomyelitis.  Unknown organism with no bone culture noted.  Wound swab results noted.  Will treat broadly with doxycycline and levaquin and  plan for about 4 weeks. Will follow clinically for improvement.    Plan: 1)  doxycline + levaquin 750 mg 2) baseline CBC and CMP with creat  Follow up in 2 weeks

## 2022-04-30 ENCOUNTER — Encounter (HOSPITAL_BASED_OUTPATIENT_CLINIC_OR_DEPARTMENT_OTHER): Payer: Medicare HMO | Admitting: Internal Medicine

## 2022-04-30 DIAGNOSIS — E11621 Type 2 diabetes mellitus with foot ulcer: Secondary | ICD-10-CM

## 2022-04-30 DIAGNOSIS — L97522 Non-pressure chronic ulcer of other part of left foot with fat layer exposed: Secondary | ICD-10-CM | POA: Diagnosis not present

## 2022-04-30 DIAGNOSIS — E1151 Type 2 diabetes mellitus with diabetic peripheral angiopathy without gangrene: Secondary | ICD-10-CM | POA: Diagnosis not present

## 2022-04-30 LAB — CBC WITH DIFFERENTIAL/PLATELET
Absolute Monocytes: 570 cells/uL (ref 200–950)
Basophils Absolute: 69 cells/uL (ref 0–200)
Basophils Relative: 0.9 %
Eosinophils Absolute: 339 cells/uL (ref 15–500)
Eosinophils Relative: 4.4 %
HCT: 45 % (ref 38.5–50.0)
Hemoglobin: 14.6 g/dL (ref 13.2–17.1)
Lymphs Abs: 1810 cells/uL (ref 850–3900)
MCH: 25.8 pg — ABNORMAL LOW (ref 27.0–33.0)
MCHC: 32.4 g/dL (ref 32.0–36.0)
MCV: 79.5 fL — ABNORMAL LOW (ref 80.0–100.0)
MPV: 9.6 fL (ref 7.5–12.5)
Monocytes Relative: 7.4 %
Neutro Abs: 4913 cells/uL (ref 1500–7800)
Neutrophils Relative %: 63.8 %
Platelets: 422 10*3/uL — ABNORMAL HIGH (ref 140–400)
RBC: 5.66 10*6/uL (ref 4.20–5.80)
RDW: 15.4 % — ABNORMAL HIGH (ref 11.0–15.0)
Total Lymphocyte: 23.5 %
WBC: 7.7 10*3/uL (ref 3.8–10.8)

## 2022-04-30 LAB — COMPREHENSIVE METABOLIC PANEL
AG Ratio: 1.3 (calc) (ref 1.0–2.5)
ALT: 17 U/L (ref 9–46)
AST: 14 U/L (ref 10–35)
Albumin: 4.2 g/dL (ref 3.6–5.1)
Alkaline phosphatase (APISO): 116 U/L (ref 35–144)
BUN: 10 mg/dL (ref 7–25)
CO2: 23 mmol/L (ref 20–32)
Calcium: 10 mg/dL (ref 8.6–10.3)
Chloride: 102 mmol/L (ref 98–110)
Creat: 0.99 mg/dL (ref 0.70–1.35)
Globulin: 3.3 g/dL (calc) (ref 1.9–3.7)
Glucose, Bld: 295 mg/dL — ABNORMAL HIGH (ref 65–99)
Potassium: 4.7 mmol/L (ref 3.5–5.3)
Sodium: 138 mmol/L (ref 135–146)
Total Bilirubin: 0.3 mg/dL (ref 0.2–1.2)
Total Protein: 7.5 g/dL (ref 6.1–8.1)

## 2022-04-30 NOTE — Progress Notes (Signed)
[name], LYBRAND [name] ([id number]) [id number] Page 1 of 10 Visit Report for 04/30/2022 Arrival Information Details Patient Name: Date of Service: [name], [name]. 04/30/2022 10:45 [name] Medical Record Number: [id number] Patient Account Number: [id number] Date of Birth/Sex: Treating RN: Nov 30, 1955 (66 y.o. [name] Primary Care Celest Reitz: York Ram Other Clinician: Referring Tyronza Happe: Treating Bernhard Koskinen/Extender: Coralee North Weeks in Treatment: 2 Visit Information History Since Last Visit Added or deleted any medications: No Patient Arrived: Wheel Chair Any new allergies or adverse reactions: No Arrival Time: 10:48 Had [name] fall or experienced change in No Accompanied By: family activities of daily living that may affect Transfer Assistance: None risk of falls: Patient Identification Verified: Yes Signs or symptoms of abuse/neglect since last visito No Secondary Verification Process Completed: Yes Hospitalized since last visit: No Patient Requires Transmission-Based Precautions: No Implantable device outside of the clinic excluding No Patient Has Alerts: No cellular tissue based products placed in the center since last visit: Has Dressing in Place as Prescribed: Yes Pain Present Now: No Electronic Signature(s) Signed: 04/30/2022 4:24:03 PM By: Adline Peals Entered By: Adline Peals on 04/30/2022 10:52:27 -------------------------------------------------------------------------------- Encounter Discharge Information Details Patient Name: Date of Service: [name], [name] 04/30/2022 10:45 [name] Medical Record Number: [id number] Patient Account Number: [id number] Date of Birth/Sex: Treating RN: 1956-02-02 (66 y.o. [name] Primary Care [name]: York Ram Other Clinician: Referring [name]: Treating [name]/Extender: Coralee North Weeks in Treatment:  2 Encounter Discharge Information Items Post Procedure Vitals Discharge Condition: Stable Temperature (F): 98.7 Ambulatory Status: Ambulatory Pulse (bpm): 74 Discharge Destination: Home Respiratory Rate (breaths/min): 17 Transportation: Private Auto Blood Pressure (mmHg): 120/80 Accompanied By: wife Schedule Follow-up Appointment: Yes Clinical Summary of Care: Patient Declined Electronic Signature(s) Signed: 04/30/2022 4:44:37 PM By: Rhae Hammock RN Entered By: Rhae Hammock on 04/30/2022 11:29:55 [name] ([id number]) [id number] Page 2 of 10 -------------------------------------------------------------------------------- Lower Extremity Assessment Details Patient Name: Date of Service: [name], [name] 04/30/2022 10:45 [name] Medical Record Number: [id number] Patient Account Number: [id number] Date of Birth/Sex: Treating RN: [birth year] (66 y.o. [name] Primary Care [name]: York Ram Other Clinician: Referring Bob Eastwood: Treating Britney Newstrom/Extender: Coralee North Weeks in Treatment: 2 Edema Assessment Assessed: [Left: No] [Right: No] Edema: [Left: Ye] [Right: s] Calf Left: Right: Point of Measurement: 36 cm From Medial Instep 37.1 cm Ankle Left: Right: Point of Measurement: 9 cm From Medial Instep 24.5 cm Electronic Signature(s) Signed: 04/30/2022 4:24:03 PM By: Adline Peals Entered By: Adline Peals on 04/30/2022 10:56:26 -------------------------------------------------------------------------------- Multi Wound Chart Details Patient Name: Date of Service: [name], [name] 04/30/2022 10:45 [name] Medical Record Number: [id number] Patient Account Number: [id number] Date of Birth/Sex: Treating RN: March 11, 1956 (66 y.o. [name] Primary Care [name]: York Ram Other Clinician: Referring Kay Ricciuti: Treating [name]/Extender: Coralee North Weeks in Treatment: 2 Vital Signs Height(in): 72 Capillary Blood Glucose(mg/dl): 226 Weight(lbs): 220 Pulse(bpm): 97 Body Mass Index(BMI): 29.8 Blood Pressure(mmHg): 164/97 Temperature(F): 98.5 Respiratory Rate(breaths/min): 18 [4:Photos:] [[name]:[name]] Left, Dorsal Foot Left, Lateral Foot [name] Wound Location: Gradually Appeared Surgical Injury [name] Wounding Event: Diabetic Wound/Ulcer of the Lower Diabetic Wound/Ulcer of the Lower [name] Primary Etiology: Extremity Extremity [name] Open Surgical Wound [name] Secondary Etiology: Asthma, Sleep Apnea, Hypertension, Asthma, Sleep Apnea, Hypertension, [name] Comorbid HistoryKYSER, [name] ([id number]) [id number] Page 3 of 10 Peripheral Venous Disease, Type II Peripheral Venous Disease, Type II Diabetes, Osteomyelitis, Neuropathy Diabetes, Osteomyelitis, Neuropathy 09/03/2021  12/20/2021 [name] Date Acquired: 2 2 [name] Weeks of Treatment: Open Open [name] Wound Status: No No [name] Wound Recurrence: 2x1.2x0.2 6.6x1.9x0.7 [name] Measurements L x W x D (cm) 1.885 9.849 [name] [name] (cm) : rea 0.377 6.894 [name] Volume (cm) : 20.00% 1.50% [name] % Reduction in [name] rea: 20.00% -37.90% [name] % Reduction in Volume: Grade 3 Grade 3 [name] Classification: Medium Medium [name] Exudate [name] mount: Serosanguineous Serosanguineous [name] Exudate Type: red, brown red, brown [name] Exudate Color: Distinct, outline attached Epibole [name] Wound Margin: Medium (34-66%) Large (67-100%) [name] Granulation [name] mount: Pink, Pale Red, Pink [name] Granulation Quality: Medium (34-66%) Small (1-33%) [name] Necrotic [name] mount: Eschar, Adherent Slough Adherent Slough [name] Necrotic Tissue: Fat Layer (Subcutaneous Tissue): Yes Fat Layer (Subcutaneous Tissue): Yes [name] Exposed Structures: Fascia: No Bone: Yes Tendon: No Fascia: No Muscle: No Tendon: No Joint: No Muscle: No Bone: No Joint: No Small (1-33%) Small (1-33%) [name] Epithelialization: Debridement - Excisional  [name] [name] Debridement: Pre-procedure Verification/Time Out 11:20 [name] [name] Taken: Lidocaine [name] [name] Pain Control: Subcutaneous, Slough [name] [name] Tissue Debrided: Skin/Subcutaneous Tissue [name] [name] Level: 2.4 [name] [name] Debridement [name] (sq cm): rea Curette [name] [name] Instrument: Minimum [name] [name] Bleeding: Pressure [name] [name] Hemostasis [name] chieved: 0 [name] [name] Procedural Pain: 0 [name] [name] Post Procedural Pain: Procedure was tolerated well [name] [name] Debridement Treatment Response: 2x1.2x0.2 [name] [name] Post Debridement Measurements L x W x D (cm) 0.377 [name] [name] Post Debridement Volume: (cm) Scarring: Yes Callus: Yes [name] Periwound Skin Texture: Excoriation: No Excoriation: No Induration: No Induration: No Callus: No Crepitus: No Crepitus: No Rash: No Rash: No Scarring: No Maceration: No Dry/Scaly: Yes [name] Periwound Skin Moisture: Dry/Scaly: No Maceration: No Atrophie Blanche: No Atrophie Blanche: No [name] Periwound Skin Color: Cyanosis: No Cyanosis: No Ecchymosis: No Ecchymosis: No Erythema: No Erythema: No Hemosiderin Staining: No Hemosiderin Staining: No Mottled: No Mottled: No Pallor: No Pallor: No Rubor: No Rubor: No No Abnormality No Abnormality [name] Temperature: Debridement [name] [name] Procedures Performed: Treatment Notes Wound #4 (Foot) Wound Laterality: Dorsal, Left Cleanser Wound Cleanser Discharge Instruction: Cleanse the wound with wound cleanser prior to applying [name] clean dressing using gauze sponges, not tissue or cotton balls. Peri-Wound Care Skin Prep Discharge Instruction: Use skin prep as directed Topical Primary Dressing Hydrofera Blue Ready Foam, 4x5 in Discharge Instruction: Apply to wound bed as instructed MediHoney Gel, tube 1.5 (oz) Discharge Instruction: Apply to wound bed as instructed Secondary Dressing JAPHET, MORGENTHALER [name] ([id number]) [phone].pdf Page 4 of 10 ABD Pad, 8x10 Discharge Instruction: Apply over primary dressing  as directed. Secured With [employer], 4.5x3.1 (in/yd) Discharge Instruction: Secure with Kerlix as directed. 71M Medipore H Soft Cloth Surgical T ape, 4 x 10 (in/yd) Discharge Instruction: Secure with tape as directed. Compression Wrap Compression Stockings Add-Ons Wound #5 (Foot) Wound Laterality: Left, Lateral Cleanser Wound Cleanser Discharge Instruction: Cleanse the wound with wound cleanser prior to applying [name] clean dressing using gauze sponges, not tissue or cotton balls. Peri-Wound Care Topical Primary Dressing Dakin's Solution 0.25%, 16 (oz) Discharge Instruction: Moisten gauze with Dakin's solution Secondary Dressing ABD Pad, 8x10 Discharge Instruction: Apply over primary dressing as directed. Secured With [employer], 4.5x3.1 (in/yd) Discharge Instruction: Secure with Kerlix as directed. 71M Medipore H Soft Cloth Surgical T ape, 4 x 10 (in/yd) Discharge Instruction: Secure with tape as directed. Compression Wrap Compression Stockings Add-Ons Electronic Signature(s) Signed: 04/30/2022 12:54:06 PM By: [name] DO Signed: 04/30/2022 4:44:37 PM By: Rhae Hammock RN Entered By: [name] on  04/30/2022 11:30:22 -------------------------------------------------------------------------------- Multi-Disciplinary Care Plan Details Patient Name: Date of Service: [name], [name]. 04/30/2022 10:45 [name] Medical Record Number: [id number] Patient Account Number: [id number] Date of Birth/Sex: Treating RN: 04-Mar-1956 (66 y.o. [name] Primary Care Adela Esteban: York Ram Other Clinician: Referring Jahmeer Porche: Treating Trenten Watchman/Extender: Coralee North Weeks in Treatment: 2 Active Inactive Osteomyelitis Nursing Diagnoses: YUCHEN, FEDOR ([id number]) [phone].pdf Page 5 of 10 Infection: osteomyelitis Goals: Diagnostic evaluation for osteomyelitis completed as ordered Date  Initiated: 04/23/2022 Target Resolution Date: 05/08/2022 Goal Status: Active Signs and symptoms for osteomyelitis will be recognized and promptly addressed Date Initiated: 04/23/2022 Target Resolution Date: 05/07/2022 Goal Status: Active Interventions: Assess for signs and symptoms of osteomyelitis resolution every visit Provide education on osteomyelitis Screen for HBO Treatment Activities: Biopsy : 04/16/2022 Consult for HBO : 04/23/2022 Surgical debridement : 04/23/2022 Systemic antibiotics : 04/23/2022 T ordered outside of clinic : 04/23/2022 est Notes: Wound/Skin Impairment Nursing Diagnoses: Knowledge deficit related to ulceration/compromised skin integrity Goals: Patient/caregiver will verbalize understanding of skin care regimen Date Initiated: 04/14/2022 Target Resolution Date: 05/08/2022 Goal Status: Active Interventions: Assess patient/caregiver ability to perform ulcer/skin care regimen upon admission and as needed Assess ulceration(s) every visit Provide education on ulcer and skin care Notes: Electronic Signature(s) Signed: 04/30/2022 4:44:37 PM By: Rhae Hammock RN Entered By: Rhae Hammock on 04/30/2022 11:20:31 -------------------------------------------------------------------------------- Pain Assessment Details Patient Name: Date of Service: [name], [name] 04/30/2022 10:45 [name] Medical Record Number: [id number] Patient Account Number: [id number] Date of Birth/Sex: Treating RN: February 28, 1956 (66 y.o. [name] Primary Care Serinity Ware: York Ram Other Clinician: Referring Rashawna Scoles: Treating Taya Ashbaugh/Extender: Coralee North Weeks in Treatment: 2 Active Problems Location of Pain Severity and Description of Pain Patient Has Paino No Site Locations Rate the pain. ARMOUR, VILLANUEVA [name] ([id number]) [id number] Page 6 of 10 Rate the pain. Current Pain Level: 0 Pain Management and  Medication Current Pain Management: Electronic Signature(s) Signed: 04/30/2022 4:24:03 PM By: Adline Peals Entered By: Adline Peals on 04/30/2022 10:54:29 -------------------------------------------------------------------------------- Patient/Caregiver Education Details Patient Name: Date of Service: [name], Vinetta Bergamo 10/26/2023andnbsp10:45 [name] Medical Record Number: [id number] Patient Account Number: [id number] Date of Birth/Gender: Treating RN: 07/29/1955 (66 y.o. [name] Primary Care Physician: York Ram Other Clinician: Referring Physician: Treating Physician/Extender: Dimas Aguas in Treatment: 2 Education Assessment Education Provided To: Patient and Caregiver Education Topics Provided Infection: Methods: Explain/Verbal Responses: Reinforcements needed, State content correctly Wound/Skin Impairment: Methods: Explain/Verbal Responses: Reinforcements needed, State content correctly Electronic Signature(s) Signed: 04/30/2022 4:44:37 PM By: Rhae Hammock RN Entered By: Rhae Hammock on 04/30/2022 11:20:47 -------------------------------------------------------------------------------- Wound Assessment Details Patient Name: Date of Service: [name], [name] 04/30/2022 10:45 [name] Cyndie Chime [name] ([id number]) [id number] Page 7 of 10 Medical Record Number: [id number] Patient Account Number: [id number] Date of Birth/Sex: Treating RN: June 14, 1956 (66 y.o. [name] Primary Care Brandan Robicheaux: York Ram Other Clinician: Referring Iriel Nason: Treating Krystol Rocco/Extender: Coralee North Weeks in Treatment: 2 Wound Status Wound Number: 4 Primary Diabetic Wound/Ulcer of the Lower Extremity Etiology: Wound Location: Left, Dorsal Foot Wound Open Wounding Event: Gradually Appeared Status: Date Acquired: 09/03/2021 Comorbid Asthma, Sleep Apnea,  Hypertension, Peripheral Venous Disease, Weeks Of Treatment: 2 History: Type II Diabetes, Osteomyelitis, Neuropathy Clustered Wound: No Photos Wound Measurements Length: (cm) 2 Width: (cm) 1.2 Depth: (cm) 0.2 Area: (cm) 1.885 Volume: (cm) 0.377 % Reduction in Area: 20% % Reduction in Volume: 20% Epithelialization: Small (1-33%)  Tunneling: No Undermining: No Wound Description Classification: Grade 3 Wound Margin: Distinct, outline attached Exudate Amount: Medium Exudate Type: Serosanguineous Exudate Color: red, brown Foul Odor After Cleansing: No Slough/Fibrino Yes Wound Bed Granulation Amount: Medium (34-66%) Exposed Structure Granulation Quality: Pink, Pale Fascia Exposed: No Necrotic Amount: Medium (34-66%) Fat Layer (Subcutaneous Tissue) Exposed: Yes Necrotic Quality: Eschar, Adherent Slough Tendon Exposed: No Muscle Exposed: No Joint Exposed: No Bone Exposed: No Periwound Skin Texture Texture Color No Abnormalities Noted: No No Abnormalities Noted: Yes Callus: No Temperature / Pain Crepitus: No Temperature: No Abnormality Excoriation: No Induration: No Rash: No Scarring: Yes Moisture No Abnormalities Noted: Yes Treatment Notes Wound #4 (Foot) Wound Laterality: Dorsal, Left Cleanser Wound Cleanser Discharge Instruction: Cleanse the wound with wound cleanser prior to applying [name] clean dressing using gauze sponges, not tissue or cotton balls. Peri-Wound Care Skin Prep KENTARIUS, PARTINGTON [name] ([id number]) [phone].pdf Page 8 of 10 Discharge Instruction: Use skin prep as directed Topical Primary Dressing Hydrofera Blue Ready Foam, 4x5 in Discharge Instruction: Apply to wound bed as instructed MediHoney Gel, tube 1.5 (oz) Discharge Instruction: Apply to wound bed as instructed Secondary Dressing ABD Pad, 8x10 Discharge Instruction: Apply over primary dressing as directed. Secured With [employer], 4.5x3.1 (in/yd) Discharge  Instruction: Secure with Kerlix as directed. 53M Medipore H Soft Cloth Surgical T ape, 4 x 10 (in/yd) Discharge Instruction: Secure with tape as directed. Compression Wrap Compression Stockings Add-Ons Electronic Signature(s) Signed: 04/30/2022 4:24:03 PM By: Adline Peals Signed: 04/30/2022 4:54:57 PM By: [name] Entered By: [name] on 04/30/2022 11:00:55 -------------------------------------------------------------------------------- Wound Assessment Details Patient Name: Date of Service: [name], [name] 04/30/2022 10:45 [name] Medical Record Number: [id number] Patient Account Number: [id number] Date of Birth/Sex: Treating RN: Jan 27, 1956 (66 y.o. [name] Primary Care Taleya Whitcher: York Ram Other Clinician: Referring Aneth Schlagel: Treating Dheeraj Hail/Extender: Coralee North Weeks in Treatment: 2 Wound Status Wound Number: 5 Primary Diabetic Wound/Ulcer of the Lower Extremity Etiology: Wound Location: Left, Lateral Foot Secondary Open Surgical Wound Wounding Event: Surgical Injury Etiology: Date Acquired: 12/20/2021 Wound Open Weeks Of Treatment: 2 Status: Clustered Wound: No Comorbid Asthma, Sleep Apnea, Hypertension, Peripheral Venous Disease, History: Type II Diabetes, Osteomyelitis, Neuropathy Photos Wound Measurements Length: (cm) 6.6 Width: (cm) 1.9 Rigaud, Shahzad [name] ([id number]) Depth: (cm) 0.7 Area: (cm) 9.849 Volume: (cm) 6.894 % Reduction in Area: 1.5% % Reduction in Volume: -37.9% [id number] Page 9 of 10 Epithelialization: Small (1-33%) Tunneling: No Undermining: No Wound Description Classification: Grade 3 Wound Margin: Epibole Exudate Amount: Medium Exudate Type: Serosanguineous Exudate Color: red, brown Foul Odor After Cleansing: No Slough/Fibrino Yes Wound Bed Granulation Amount: Large (67-100%) Exposed Structure Granulation Quality: Red, Pink Fascia Exposed:  No Necrotic Amount: Small (1-33%) Fat Layer (Subcutaneous Tissue) Exposed: Yes Necrotic Quality: Adherent Slough Tendon Exposed: No Muscle Exposed: No Joint Exposed: No Bone Exposed: Yes Periwound Skin Texture Texture Color No Abnormalities Noted: No No Abnormalities Noted: Yes Callus: Yes Temperature / Pain Crepitus: No Temperature: No Abnormality Excoriation: No Induration: No Rash: No Scarring: No Moisture No Abnormalities Noted: No Dry / Scaly: Yes Maceration: No Treatment Notes Wound #5 (Foot) Wound Laterality: Left, Lateral Cleanser Wound Cleanser Discharge Instruction: Cleanse the wound with wound cleanser prior to applying [name] clean dressing using gauze sponges, not tissue or cotton balls. Peri-Wound Care Topical Primary Dressing Dakin's Solution 0.25%, 16 (oz) Discharge Instruction: Moisten gauze with Dakin's solution Secondary Dressing ABD Pad, 8x10 Discharge Instruction: Apply over primary dressing as directed. Secured With [employer]  [employer], 4.5x3.1 (in/yd) Discharge Instruction: Secure with Kerlix as directed. 23M Medipore H Soft Cloth Surgical T ape, 4 x 10 (in/yd) Discharge Instruction: Secure with tape as directed. Compression Wrap Compression Stockings Add-Ons Electronic Signature(s) Signed: 04/30/2022 4:24:03 PM By: Adline Peals Signed: 04/30/2022 4:54:57 PM By: [name] Entered By: [name] on 04/30/2022 11:01:14 [name] ([id number]) [id number] Page 10 of 10 -------------------------------------------------------------------------------- Vitals Details Patient Name: Date of Service: [name], [name] 04/30/2022 10:45 [name] Medical Record Number: [id number] Patient Account Number: [id number] Date of Birth/Sex: Treating RN: [birth year] (66 y.o. [name] Primary Care Nyla Creason: York Ram Other Clinician: Referring Nalany Steedley: Treating Zabdiel Dripps/Extender: Coralee North Weeks in Treatment: 2 Vital Signs Time Taken: 10:53 Temperature (F): 98.5 Height (in): 72 Pulse (bpm): 97 Weight (lbs): 220 Respiratory Rate (breaths/min): 18 Body Mass Index (BMI): 29.8 Blood Pressure (mmHg): 164/97 Capillary Blood Glucose (mg/dl): 226 Reference Range: 80 - 120 mg / dl Electronic Signature(s) Signed: 04/30/2022 4:24:03 PM By: Adline Peals Entered By: Adline Peals on 04/30/2022 10:54:23

## 2022-04-30 NOTE — Progress Notes (Signed)
KINO, DUNSWORTH A ([id number]) [id number] Page 1 of 10 Visit Report for 04/30/2022 Chief Complaint Document Details Patient Name: Date of Service: [name], [name] 04/30/2022 10:45 A M Medical Record Number: [id number] Patient Account Number: [id number] Date of Birth/Sex: Treating RN: [birth year] (66 y.o. [name] Primary Care Provider: York Ram Other Clinician: Referring Provider: Treating Provider/Extender: Coralee North Weeks in Treatment: 2 Information Obtained from: Patient Chief Complaint 10/16/20; patient is here with a substantial wound on his right foot in the setting of a recent transmetatarsal amputation. 04/14/2022; referral from podiatry for postsurgical wound of nonhealing partial left fifth ray amputation Electronic Signature(s) Signed: 04/30/2022 12:54:06 PM By: [name] DO Entered By: [name] on 04/30/2022 11:30:29 -------------------------------------------------------------------------------- Debridement Details Patient Name: Date of Service: [name], [name] 04/30/2022 10:45 A M Medical Record Number: [id number] Patient Account Number: [id number] Date of Birth/Sex: Treating RN: September 05, 1955 (66 y.o. [name], [name] Primary Care Provider: York Ram Other Clinician: Referring Provider: Treating Provider/Extender: Coralee North Weeks in Treatment: 2 Debridement Performed for Assessment: Wound #4 Left,Dorsal Foot Performed By: Physician [name], DO Debridement Type: Debridement Severity of Tissue Pre Debridement: Fat layer exposed Level of Consciousness (Pre-procedure): Awake and Alert Pre-procedure Verification/Time Out Yes - 11:20 Taken: Start Time: 11:20 Pain Control: Lidocaine T Area Debrided (L x W): otal 2 (cm) x 1.2 (cm) = 2.4 (cm) Tissue and other material debrided: Viable, Non-Viable, Slough, Subcutaneous, Slough Level:  Skin/Subcutaneous Tissue Debridement Description: Excisional Instrument: Curette Bleeding: Minimum Hemostasis Achieved: Pressure End Time: 11:20 Procedural Pain: 0 Post Procedural Pain: 0 Response to Treatment: Procedure was tolerated well Level of Consciousness (Post- Awake and Alert procedure): Post Debridement Measurements of Total Wound Length: (cm) 2 Width: (cm) 1.2 Depth: (cm) 0.2 Volume: (cm) 0.377 Character of Wound/Ulcer Post Debridement: Improved RAN, TULLIS A ([id number]) [id number] Page 2 of 10 Severity of Tissue Post Debridement: Fat layer exposed Post Procedure Diagnosis Same as Pre-procedure Electronic Signature(s) Signed: 04/30/2022 12:54:06 PM By: [name] DO Signed: 04/30/2022 4:44:37 PM By: Rhae Hammock RN Entered By: Rhae Hammock on 04/30/2022 11:23:56 -------------------------------------------------------------------------------- HPI Details Patient Name: Date of Service: [name], [name] 04/30/2022 10:45 A M Medical Record Number: [id number] Patient Account Number: [id number] Date of Birth/Sex: Treating RN: [birth year] (66 y.o. [name] Primary Care Provider: York Ram Other Clinician: Referring Provider: Treating Provider/Extender: Coralee North Weeks in Treatment: 2 History of Present Illness HPI Description: ADMISSION 10/16/20 This is a 66 year old man who is a type II diabetic. Initially seen by Dr. Unk Lightning of vein and vascular on 09/10/2021 for bilateral lower extremity rest pain right greater than left. His ABIs demonstrated monophasic waveforms at the ankles bilaterally with depressed toe pressures. His ABI in the right was 0.5 and on the left 0.28. There were no obtainable waveforms for TBI's. He underwent angiography on 09/10/2021 and had findings of severe PAD with 95% stenosis of the distal SFA. He also had peroneal and posterior tibial arteries occluded  with significant collateralization in the calf there was reconstitution of the posterior tibial artery in the distal third of the calf. Ostia of the anterior tibial artery was not appreciated. He underwent a angioplasty of the superficial femoral artery. He required admission to hospital from 09/12/2021 through 09/22/2021 with right foot cellulitis and sepsis. On 3/13 he underwent a right below-knee popliteal to posterior tibial bypass using a greater saphenous vein. Ultimately however he required a right TMA by podiatry  which I believe was done on 3/17 for progressive diabetic foot infection. He was discharged to [facility] skilled facility. He arrives in clinic today with a large wound area over the entirety of his amputation site extending proximally. The attempted flap closure of the amputation site and his TMA has failed and the wound extends under the attempt to closure. Still some sutures in place. There is an odor but he is not systemically unwell. He is due for follow-up arterial studies and has an appointment with vascular surgery on 10/28/2021. They are using wet-to-dry dressings at [facility]. Past medical history includes type 2 diabetes with peripheral neuropathy, hypertension, obstructive sleep apnea 4/20; patient presents for follow-up. He is being discharged from his facility at [facility] today. They have been doing Dakin's wet-to-dry dressings. He states that his fiance can help with dressing changes. He is getting [facility] to come out 3 times a week once he leaves the facility. He is scheduled to see vein and vascular on 5/12. He has not seen the wound himself. He puts covers over his face for wound exams. He currently denies systemic signs of infection. 4/28; patient admitted to the clinic 2 weeks ago with a dehisced right TMA in the setting of type 2 diabetes with significant PAD. He is status post revascularization. He has been discharged from the nursing home he was at and  now is at home using Dakin's wet-to-dry dressings daily he has home health. He denies any systemic signs of infection 5/4; patient presents for follow-up. His fiance and home health have been changing his dressings. He currently denies systemic signs of infection. He continues to put sheet covers over his face during our wound encounters because he does not want a look at the wound. 5/25; Patient presents for follow-up. He missed his last clinic appointment. He had a bone biopsy and culture done at last clinic visit that showed acute osteomyelitis with growth of E. coli, stenotrophomonas maltophilia and enteroccocus faecalis. A referral to infectious disease was made. He has not heard from them yet. He has been using Dakin's wet-to-dry dressings. He is now more comfortable with looking at the wound bed. 6/6; the patient comes into clinic today with 2 wounds on the left foot which apparently have been there for several months. This was not known to assess but was known by Dr. Donzetta Matters. In fact he underwent an angiogram yesterday. He had a laser atherectomy of the left posterior tibial artery with a 3 mm balloon angioplasty, also a stent of the last left SFA. 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 This is a lot better than the last time I saw it. He has been using Dakin's wet-to-dry. He has an appointment with infectious disease for a bone biopsy which showed E. coli, stenotrophomonas and Enterococcus faecalis. The appointment is for tomorrow 04/14/2022 Mr. Grayer Sproles ajuddin is an uncontrolled type 2 diabetic on insulin that presents with a left foot ulcer. On 12/18/2021 he required a left fifth ray amputation. He had revision of this site on 6/17 by Dr. Posey Pronto, podiatry. He states he has had a wound VAC on this area for the past 3 weeks. It is  unclear what the prior wound care has been. He has a history of osteomyelitis to this area and was treated with IV  and oral antibiotics For 6 weeks by Dr. Linus Salmons of infectious disease. At his last follow-up on 02/20/2022 his CRP was trending down and antibiotics were stopped. He also has a history of peripheral arterial disease status post stenting to the left SFA and balloon angioplasty to the left posterior tibial artery On 12/2021. He has follow-up with vein and vascular at the end of the month. He also has a wound to the dorsal aspect of the left foot that he has been placing Betadine on. 10/19; patient presents for follow-up. He had a bone biopsy of the left foot at last clinic visit showed osteomyelitis. The bone culture showed Enterobacter cloacae, Enterococcus faecalis, Staphylococcus aureus, viridans group strep, actinotigum schaelii. He has been referred to infectious disease And has an appointment on October 25. He has been using Dakin's wet-to-dry dressing to the lateral left foot wound and Hydrofera Blue and Medihoney to the dorsal foot wound. He has no issues or complaints today. He denies systemic signs of infection. HANSEN, CARINO A ([id number]) [id number] Page 3 of 10 10/26; patient presents for follow-up. He saw Dr. Linus Salmons yesterday with infectious disease and has been prescribed doxycycline and Levaquin for his chronic osteomyelitis. He has been using Dakin's wet-to-dry dressings to the lateral left foot wound and Medihoney and Hydrofera Blue to the dorsal wound. We discussed a skin graft for the dorsal foot wound and he would like to proceed with insurance verification. Electronic Signature(s) Signed: 04/30/2022 12:54:06 PM By: [name] DO Entered By: [name] on 04/30/2022 11:30:42 -------------------------------------------------------------------------------- Physical Exam Details Patient Name: Date of Service: [name], [name]  04/30/2022 10:45 A M Medical Record Number: [id number] Patient Account Number: [id number] Date of Birth/Sex: Treating RN: [birth year] (66 y.o. [name] Primary Care Provider: York Ram Other Clinician: Referring Provider: Treating Provider/Extender: Coralee North Weeks in Treatment: 2 Constitutional respirations regular, non-labored and within target range for patient.. Cardiovascular 2+ dorsalis pedis/posterior tibialis pulses. Psychiatric pleasant and cooperative. Notes Left foot: T the lateral aspect there is an open wound with granulation tissue and nonviable tissue and exposed bone. T the dorsal aspect there is an open o o wound with nonviable tissue and granulation tissue present. No signs of surrounding soft tissue infection. Electronic Signature(s) Signed: 04/30/2022 12:54:06 PM By: [name] DO Entered By: [name] on 04/30/2022 11:31:20 -------------------------------------------------------------------------------- Physician Orders Details Patient Name: Date of Service: [name], [name] 04/30/2022 10:45 A M Medical Record Number: [id number] Patient Account Number: [id number] Date of Birth/Sex: Treating RN: [birth year] (66 y.o. [name] Primary Care Provider: York Ram Other Clinician: Referring Provider: Treating Provider/Extender: Coralee North Weeks in Treatment: 2 Verbal / Phone Orders: No Diagnosis Coding Follow-up Appointments ppointment in 1 week. - Dr. Heber  Thursday Return A Other: - Ensure you go to all your appointments: Friday 04/24/2022 [address] Podiat[address] Posey Pronto Wednesday 04/29/2022 [address] Linus Salmons Friday 05/01/2022 1000 Vein andVascular Dr. Virl Cagey Anesthetic (In clinic) Topical Lidocaine 5% applied to wound bed Cellular or Tissue Based Products Cellular or Tissue Based Product Type: - Run IVR for Grafix for left dorsal foot SEITH, AIKEY A ([id number]) [id number] Page 4 of 10 Bathing/ Shower/ Hygiene May shower with protection but do not get wound dressing(s) wet. Negative Presssure Wound Therapy Discontinue wound vac - Call [facility] to pick up wound vac. Edema Control - Lymphedema / SCD / Other Elevate legs to the level of the heart or above for 30 minutes  daily and/or when sitting, a frequency of: - 3-4 times a day throughout the day. Avoid standing for long periods of time. Off-Loading Open toe surgical shoe to: - wear when standing or walking. Home Health New wound care orders this week; continue Home Health for wound care. May utilize formulary equivalent dressing for wound treatment orders unless otherwise specified. - dakins wet to dry to left lateral foot. medihoney and hydrofera blue to dorsal foot. Home health x3 times a week, wound center weekly, and all other day family to change. Patient to continue using betadine to right foot as instructed by podiatry until next week and Dr. Heber Hermitage will reevaluate the right foot wound. Dressing changes to be completed by [place] on Monday / Wednesday / Friday except when patient has scheduled visit at [facility]. Other Home Health Orders/Instructions: - [facility]. Wound Treatment Wound #4 - Foot Wound Laterality: Dorsal, Left Cleanser: Wound Cleanser (Home Health) 1 x Per Day/30 Days Discharge Instructions: Cleanse the wound with wound cleanser prior to applying a clean dressing using gauze sponges, not tissue or cotton balls. Peri-Wound Care: Skin Prep (Home Health) 1 x Per Day/30 Days Discharge Instructions: Use skin prep as directed Prim Dressing: Hydrofera Blue Ready Foam, 4x5 in (Home Health) 1 x Per Day/30 Days ary Discharge Instructions: Apply to wound bed as instructed Prim Dressing: MediHoney Gel, tube 1.5 (oz) ([place]) 1 x Per Day/30 Days ary Discharge Instructions: Apply to wound bed as  instructed Secondary Dressing: ABD Pad, 8x10 ([place]) 1 x Per Day/30 Days Discharge Instructions: Apply over primary dressing as directed. Secured With: [employer], 4.5x3.1 (in/yd) (Home Health) 1 x Per Day/30 Days Discharge Instructions: Secure with Kerlix as directed. Secured With: 15M Medipore H Soft Cloth Surgical T ape, 4 x 10 (in/yd) (Home Health) 1 x Per Day/30 Days Discharge Instructions: Secure with tape as directed. Wound #5 - Foot Wound Laterality: Left, Lateral Cleanser: Wound Cleanser (Home Health) 1 x Per Day/30 Days Discharge Instructions: Cleanse the wound with wound cleanser prior to applying a clean dressing using gauze sponges, not tissue or cotton balls. Prim Dressing: Dakin's Solution 0.25%, 16 (oz) (Home Health) 1 x Per Day/30 Days ary Discharge Instructions: Moisten gauze with Dakin's solution Secondary Dressing: ABD Pad, 8x10 (Home Health) 1 x Per Day/30 Days Discharge Instructions: Apply over primary dressing as directed. Secured With: [employer], 4.5x3.1 (in/yd) (Home Health) 1 x Per Day/30 Days Discharge Instructions: Secure with Kerlix as directed. Secured With: 15M Medipore H Soft Cloth Surgical T ape, 4 x 10 (in/yd) (Home Health) 1 x Per Day/30 Days Discharge Instructions: Secure with tape as directed. Electronic Signature(s) Signed: 04/30/2022 12:54:06 PM By: [name] DO Entered By: [name] on 04/30/2022 11:31:28 Problem List Details -------------------------------------------------------------------------------- [name] ([id number]) [id number] Page 5 of 10 Patient Name: Date of Service: [name], [name] 04/30/2022 10:45 A M Medical Record Number: [id number] Patient Account Number: [id number] Date of Birth/Sex: Treating RN: [birth year] (66 y.o. [name], [name] Primary Care Provider: York Ram Other Clinician: Referring Provider: Treating Provider/Extender: Coralee North Weeks in Treatment: 2 Active Problems ICD-10 Encounter Code Description Active Date MDM Diagnosis [phone] Non-pressure chronic ulcer of other part of left foot with fat layer exposed 04/14/2022 No Yes L97.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 specified 04/14/2022 No Yes severity E11.621 Type 2 diabetes mellitus with foot ulcer 04/14/2022 No Yes E11.42 Type 2 diabetes mellitus  with diabetic polyneuropathy 04/14/2022 No Yes Inactive Problems Resolved Problems Electronic Signature(s) Signed: 04/30/2022 12:54:06 PM By: [name] DO Entered By: [name] on 04/30/2022 11:30:16 -------------------------------------------------------------------------------- Progress Note Details Patient Name: Date of Service: [name], [name] 04/30/2022 10:45 A M Medical Record Number: [id number] Patient Account Number: [id number] Date of Birth/Sex: Treating RN: [birth year] (66 y.o. [name] Primary Care Provider: York Ram Other Clinician: Referring Provider: Treating Provider/Extender: Coralee North Weeks in Treatment: 2 Subjective Chief Complaint Information obtained from Patient 10/16/20; patient is here with a substantial wound on his right foot in the setting of a recent transmetatarsal amputation. 04/14/2022; referral from podiatry for postsurgical wound of nonhealing partial left fifth ray amputation History of Present Illness (HPI) ADMISSION 10/16/20 This is a 66 year old man who is a type II diabetic. Initially seen by Dr. Unk Lightning of vein and vascular on 09/10/2021 for bilateral lower extremity rest pain right greater than left. His ABIs demonstrated monophasic waveforms at the ankles bilaterally with depressed toe pressures. His ABI in the right was 0.5 and on the left 0.28. There were no obtainable waveforms for TBI's. He underwent angiography on 09/10/2021 and had findings of severe  PAD with 95% stenosis of the distal SFA. He also had peroneal and posterior tibial arteries occluded with significant collateralization in the calf there was reconstitution of the posterior tibial artery in the distal third of the calf. Ostia of the anterior tibial artery was not appreciated. He underwent a angioplasty of the superficial femoral artery. He required admission to hospital from 09/12/2021 through 09/22/2021 with right foot cellulitis and sepsis. On 3/13 he underwent a right below-knee popliteal to posterior tibial bypass using a greater saphenous vein. 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 He was discharged to [facility] skilled facility. He arrives in clinic today with a large wound area over the entirety of his amputation site extending proximally. The attempted flap closure of the amputation CHRISTIANO, BLANDON A ([id number]) [id number] Page 6 of 10 site and his TMA has failed and the wound extends under the attempt to closure. Still some sutures in place. There is an odor but he is not systemically unwell. He is due for follow-up arterial studies and has an appointment with vascular surgery on 10/28/2021. They are using wet-to-dry dressings at [facility]. Past medical history includes type 2 diabetes with peripheral neuropathy, hypertension, obstructive sleep apnea 4/20; patient presents for follow-up. He is being discharged from his facility at [facility] today. They have been doing Dakin's wet-to-dry dressings. He states that his fiance can help with dressing changes. He is getting [facility] home [facility] to come out 3 times a week once he leaves the facility. He is scheduled to see vein and vascular on 5/12. He has not seen the wound himself. He puts covers over his face for wound exams. He currently denies systemic signs of infection. 4/28; patient admitted to the clinic 2  weeks ago with a dehisced right TMA in the setting of type 2 diabetes with significant PAD. He is status post revascularization. He has been discharged from the nursing home he was at and now is at home using Dakin's wet-to-dry dressings daily he has home health. He denies any systemic signs of infection 5/4; patient presents for follow-up. His fiance and home health have been changing his dressings. He currently denies systemic signs of infection. He continues to put sheet covers over his  face during our wound encounters because he does not want a look at the wound. 5/25; Patient presents for follow-up. He missed his last clinic appointment. He had a bone biopsy and culture done at last clinic visit that showed acute osteomyelitis with growth of E. coli, stenotrophomonas maltophilia and enteroccocus faecalis. A referral to infectious disease was made. He has not heard from them yet. He has been using Dakin's wet-to-dry dressings. He is now more comfortable with looking at the wound bed. 6/6; the patient comes into clinic today with 2 wounds on the left foot which apparently have been there for several months. This was not known to assess but was known by Dr. Donzetta Matters. In fact he underwent an angiogram yesterday. He had a laser atherectomy of the left posterior tibial artery with a 3 mm balloon angioplasty, also a stent of the last left SFA. 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 This is a lot better than the last time I saw it. He has been using Dakin's wet-to-dry. He has an appointment with infectious disease for a bone biopsy which showed E. coli, stenotrophomonas and Enterococcus faecalis. The appointment is for tomorrow 04/14/2022 Mr. Xayne Brumbaugh ajuddin is an uncontrolled type 2 diabetic on insulin that presents with a left foot ulcer. On  12/18/2021 he required a left fifth ray amputation. He had revision of this site on 6/17 by Dr. Posey Pronto, podiatry. He states he has had a wound VAC on this area for the past 3 weeks. It is unclear what the prior wound care has been. He has a history of osteomyelitis to this area and was treated with IV and oral antibiotics For 6 weeks by Dr. Linus Salmons of infectious disease. At his last follow-up on 02/20/2022 his CRP was trending down and antibiotics were stopped. He also has a history of peripheral arterial disease status post stenting to the left SFA and balloon angioplasty to the left posterior tibial artery On 12/2021. He has follow-up with vein and vascular at the end of the month. He also has a wound to the dorsal aspect of the left foot that he has been placing Betadine on. 10/19; patient presents for follow-up. He had a bone biopsy of the left foot at last clinic visit showed osteomyelitis. The bone culture showed Enterobacter cloacae, Enterococcus faecalis, Staphylococcus aureus, viridans group strep, actinotigum schaelii. He has been referred to infectious disease And has an appointment on October 25. He has been using Dakin's wet-to-dry dressing to the lateral left foot wound and Hydrofera Blue and Medihoney to the dorsal foot wound. He has no issues or complaints today. He denies systemic signs of infection. 10/26; patient presents for follow-up. He saw Dr. Linus Salmons yesterday with infectious disease and has been prescribed doxycycline and Levaquin for his chronic osteomyelitis. He has been using Dakin's wet-to-dry dressings to the lateral left foot wound and Medihoney and Hydrofera Blue to the dorsal wound. We discussed a skin graft for the dorsal foot wound and he would like to proceed with insurance verification. Patient History Information obtained from Patient, Chart. Family History Diabetes - Mother,Siblings, Heart Disease - Siblings, Hypertension - Siblings, Stroke - Mother, No family history  of Cancer, Hereditary Spherocytosis, Kidney Disease, Lung Disease, Seizures, Thyroid Problems, Tuberculosis. Social History Former smoker, Marital Status - Single, Alcohol Use - Never,  Drug Use - No History, Caffeine Use - Daily. Medical History Eyes Denies history of Cataracts, Glaucoma, Optic Neuritis Respiratory Patient has history of Asthma, Sleep Apnea Denies history of Aspiration, Chronic Obstructive Pulmonary Disease (COPD), Pneumothorax, Tuberculosis Cardiovascular Patient has history of Hypertension, Peripheral Venous Disease Denies history of Angina, Arrhythmia, Congestive Heart Failure, Coronary Artery Disease, Deep Vein Thrombosis, Hypotension, Myocardial Infarction, Peripheral Arterial Disease, Phlebitis, Vasculitis Endocrine Patient has history of Type II Diabetes Denies history of Type I Diabetes Integumentary (Skin) Denies history of History of Burn Musculoskeletal Patient has history of Osteomyelitis Denies history of Gout, Rheumatoid Arthritis, Osteoarthritis Neurologic Patient has history of Neuropathy Denies history of Dementia, Quadriplegia, Paraplegia, Seizure Disorder Hospitalization/Surgery History - 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 Posey Pronto. - aortogram 12/08/2021 Dr. Virl Cagey [facility]. Medical A Surgical History Notes nd Psychiatric PTSD DUAYNE, BRIDEAU ([id number]) [id number] Page 7 of 10 Objective Constitutional respirations regular, non-labored and within target range for patient.. Vitals Time Taken: 10:53 AM, Height: 72 in, Weight: 220 lbs, BMI: 29.8, Temperature: 98.5 F, Pulse: 97 bpm, Respiratory Rate: 18 breaths/min, Blood Pressure: 164/97 mmHg, Capillary Blood Glucose: 226 mg/dl. Cardiovascular 2+ dorsalis pedis/posterior tibialis pulses. Psychiatric pleasant and cooperative. General Notes: Left foot: T the lateral aspect there is an open wound with granulation tissue and nonviable tissue and  exposed bone. T the dorsal aspect o o there is an open wound with nonviable tissue and granulation tissue present. No signs of surrounding soft tissue infection. Integumentary (Hair, Skin) Wound #4 status is Open. Original cause of wound was Gradually Appeared. The date acquired was: 09/03/2021. The wound has been in treatment 2 weeks. The wound is located on the Left,Dorsal Foot. The wound measures 2cm length x 1.2cm width x 0.2cm depth; 1.885cm^2 area and 0.377cm^3 volume. There is Fat Layer (Subcutaneous Tissue) exposed. There is no tunneling or undermining noted. There is a medium amount of serosanguineous drainage noted. The wound margin is distinct with the outline attached to the wound base. There is medium (34-66%) pink, pale granulation within the wound bed. There is a medium (34- 66%) amount of necrotic tissue within the wound bed including Eschar and Adherent Slough. The periwound skin appearance had no abnormalities noted for moisture. The periwound skin appearance had no abnormalities noted for color. The periwound skin appearance exhibited: Scarring. The periwound skin appearance did not exhibit: Callus, Crepitus, Excoriation, Induration, Rash. Periwound temperature was noted as No Abnormality. Wound #5 status is Open. Original cause of wound was Surgical Injury. The date acquired was: 12/20/2021. The wound has been in treatment 2 weeks. The wound is located on the Left,Lateral Foot. The wound measures 6.6cm length x 1.9cm width x 0.7cm depth; 9.849cm^2 area and 6.894cm^3 volume. There is bone and Fat Layer (Subcutaneous Tissue) exposed. There is no tunneling or undermining noted. There is a medium amount of serosanguineous drainage noted. The wound margin is epibole. There is large (67-100%) red, pink granulation within the wound bed. There is a small (1-33%) amount of necrotic tissue within the wound bed including Adherent Slough. The periwound skin appearance had no abnormalities noted  for color. The periwound skin appearance exhibited: Callus, Dry/Scaly. The periwound skin appearance did not exhibit: Crepitus, Excoriation, Induration, Rash, Scarring, Maceration. Periwound temperature was noted as No Abnormality. 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 I recommended he continue this. His left lateral foot wound appears healthier today However there is still bone exposed. I recommended continuing Dakin's wet-to-dry dressings here. T the dorsal left foot wound I debrided nonviable tissue and I o recommended continuing the course with Medihoney and Hydrofera Blue. I think he benefit greatly from Grafix. We will run insurance verification on this. Follow-up in 1 week. Continue aggressive offloading. Procedures Wound #4 Pre-procedure diagnosis of Wound #4 is a Diabetic Wound/Ulcer of the Lower Extremity located on the Left,Dorsal Foot .Severity of Tissue Pre Debridement is: Fat layer exposed. There was a Excisional Skin/Subcutaneous Tissue Debridement with a total area of 2.4 sq cm performed by [name], DO. With the following instrument(s): Curette to remove Viable and Non-Viable tissue/material. Material removed includes Subcutaneous Tissue and Slough and after achieving pain control using Lidocaine. No specimens were taken. A time out was conducted at 11:20, prior to the start of the procedure. A Minimum amount of bleeding was controlled with Pressure. The procedure was tolerated well with a pain level of 0 throughout and a pain level of 0 following the procedure. Post Debridement Measurements: 2cm length x 1.2cm width x 0.2cm depth; 0.377cm^3 volume. Character of  Wound/Ulcer Post Debridement is improved. Severity of Tissue Post Debridement is: Fat layer exposed. Post procedure Diagnosis Wound #4: Same as Pre-Procedure Plan Follow-up Appointments: Return Appointment in 1 week. - Dr. Heber [place] Thursday Other: - Ensure you go to all your appointments: Friday 04/24/2022 [address] Posey Pronto Wednesday 04/29/2022 [address] Linus Salmons Friday 05/01/2022 1000 Vein [address] Dr. Virl Cagey Anesthetic: [name] ([id number]) [id number] Page 8 of 10 (In clinic) Topical Lidocaine 5% applied to wound bed Cellular or Tissue Based Products: Cellular or Tissue Based Product Type: - Run IVR for Grafix for left dorsal foot Bathing/ Shower/ Hygiene: May shower with protection but do not get wound dressing(s) wet. Negative Presssure Wound Therapy: Discontinue wound vac - Call [facility] to pick up wound vac. Edema Control - Lymphedema / SCD / Other: Elevate legs to the level of the heart or above for 30 minutes daily and/or when sitting, a frequency of: - 3-4 times a day throughout the day. Avoid standing for long periods of time. Off-Loading: Open toe surgical shoe to: - wear when standing or walking. Home Health: New wound care orders this week; continue Home Health for wound care. May utilize formulary equivalent dressing for wound treatment orders unless otherwise specified. - dakins wet to dry to left lateral foot. medihoney and hydrofera blue to dorsal foot. Home health x3 times a week, wound center weekly, and all other day family to change. Patient to continue using betadine to right foot as instructed by podiatry until next week and Dr. Heber  will reevaluate the right foot wound. Dressing changes to be completed by [facility] on Monday / Wednesday / Friday except when patient has scheduled visit at [facility]. Other Home Health Orders/Instructions: - [facility] home [facility]. WOUND #4: - Foot Wound  Laterality: Dorsal, Left Cleanser: Wound Cleanser (Home Health) 1 x Per Day/30 Days Discharge Instructions: Cleanse the wound with wound cleanser prior to applying a clean dressing using gauze sponges, not tissue or cotton balls. Peri-Wound Care: Skin Prep (Home Health) 1 x Per Day/30 Days Discharge Instructions: Use skin prep as directed Prim Dressing: Hydrofera Blue Ready Foam, 4x5 in (Home Health) 1 x Per Day/30 Days ary Discharge  Instructions: Apply to wound bed as instructed Prim Dressing: MediHoney Gel, tube 1.5 (oz) ([place]) 1 x Per Day/30 Days ary Discharge Instructions: Apply to wound bed as instructed Secondary Dressing: ABD Pad, 8x10 ([place]) 1 x Per Day/30 Days Discharge Instructions: Apply over primary dressing as directed. Secured With: [employer], 4.5x3.1 (in/yd) (Home Health) 1 x Per Day/30 Days Discharge Instructions: Secure with Kerlix as directed. Secured With: 16M Medipore H Soft Cloth Surgical T ape, 4 x 10 (in/yd) (Home Health) 1 x Per Day/30 Days Discharge Instructions: Secure with tape as directed. WOUND #5: - Foot Wound Laterality: Left, Lateral Cleanser: Wound Cleanser (Home Health) 1 x Per Day/30 Days Discharge Instructions: Cleanse the wound with wound cleanser prior to applying a clean dressing using gauze sponges, not tissue or cotton balls. Prim Dressing: Dakin's Solution 0.25%, 16 (oz) (Home Health) 1 x Per Day/30 Days ary Discharge Instructions: Moisten gauze with Dakin's solution Secondary Dressing: ABD Pad, 8x10 (Home Health) 1 x Per Day/30 Days Discharge Instructions: Apply over primary dressing as directed. Secured With: [employer], 4.5x3.1 (in/yd) (Home Health) 1 x Per Day/30 Days Discharge Instructions: Secure with Kerlix as directed. Secured With: 16M Medipore H Soft Cloth Surgical T ape, 4 x 10 (in/yd) (Home Health) 1 x Per Day/30 Days Discharge Instructions: Secure with tape as directed. 1. In office sharp  debridement 2. Medihoney and Hydrofera Blueoodorsal left foot 3. Dakin's wet-to-dry dressingsooleft lateral foot 4. Continue oral antibiotics per ID 5. Continue surgical shoeoofoam offloading pad Electronic Signature(s) Signed: 04/30/2022 12:54:06 PM By: [name] DO Entered By: [name] on 04/30/2022 11:33:18 -------------------------------------------------------------------------------- HxROS Details Patient Name: Date of Service: [name], [name] 04/30/2022 10:45 A M Medical Record Number: [id number] Patient Account Number: [id number] Date of Birth/Sex: Treating RN: 07-May-1956 (66 y.o. [name] Primary Care Provider: York Ram Other Clinician: Referring Provider: Treating Provider/Extender: Coralee North Weeks in Treatment: 2 Information Obtained From Patient Chart Eyes Medical History: Negative for: Cataracts; Glaucoma; Optic Neuritis TKAI, SERFASS A ([id number]) [id number] Page 9 of 10 Respiratory Medical History: Positive for: Asthma; Sleep Apnea Negative for: Aspiration; Chronic Obstructive Pulmonary Disease (COPD); Pneumothorax; Tuberculosis Cardiovascular Medical History: Positive for: Hypertension; Peripheral Venous Disease Negative for: Angina; Arrhythmia; Congestive Heart Failure; Coronary Artery Disease; Deep Vein Thrombosis; Hypotension; Myocardial Infarction; Peripheral Arterial Disease; Phlebitis; Vasculitis Endocrine Medical History: Positive for: Type II Diabetes Negative for: Type I Diabetes Time with diabetes: 20 years Treated with: Insulin, Oral agents Blood sugar tested every day: Yes Tested : 4x day Integumentary (Skin) Medical History: Negative for: History of Burn Musculoskeletal Medical History: Positive for: Osteomyelitis Negative for: Gout; Rheumatoid Arthritis; Osteoarthritis Neurologic Medical History: Positive for: Neuropathy Negative for:  Dementia; Quadriplegia; Paraplegia; Seizure Disorder Psychiatric Medical History: Past Medical History Notes: PTSD Immunizations Pneumococcal Vaccine: Received Pneumococcal Vaccination: No Implantable Devices None Hospitalization / Surgery History Type of Hospitalization/Surgery 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 Posey Pronto aortogram 12/08/2021 Dr. Virl Cagey [facility] Family and Social History Cancer: No; Diabetes: Yes - Mother,Siblings; Heart Disease: Yes - Siblings; Hereditary Spherocytosis: No; Hypertension: Yes - Siblings; Kidney Disease: No; Lung Disease: No; Seizures: No; Stroke: Yes - Mother; Thyroid Problems: No; Tuberculosis: No; Former smoker; Marital Status - Single; Alcohol Use: Never; Drug Use: No History; Caffeine Use: Daily; Financial Concerns: No; Food, Clothing or Shelter Needs: No; Support System Lacking: No; Transportation Concerns: No Electronic Signature(s) Signed: 04/30/2022 12:54:06 PM By: [name] DO Signed: 04/30/2022 4:44:37 PM By: Hollie Salk  [name] RN Entered By: [name] on 04/30/2022 11:30:48 [name] ([id number]) [id number] Page 10 of 10 -------------------------------------------------------------------------------- SuperBill Details Patient Name: Date of Service: [name], [name] 04/30/2022 Medical Record Number: [id number] Patient Account Number: [id number] Date of Birth/Sex: Treating RN: 1956/06/06 (66 y.o. [name], [name] Primary Care Provider: York Ram Other Clinician: Referring Provider: Treating Provider/Extender: Coralee North Weeks in Treatment: 2 Diagnosis Coding ICD-10 Codes Code Description 9290014115 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 E11.621 Type 2 diabetes mellitus with foot ulcer E11.42 Type 2  diabetes mellitus with diabetic polyneuropathy Facility Procedures : CPT4 Code: [id number] Description: 65790 - DEB SUBQ TISSUE 20 SQ CM/< ICD-10 Diagnosis Description L97.522 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.621 Type 2 diabetes mellitus with foot ulcer Modifier: Quantity: 1 Physician Procedures : CPT4 Code Description Modifier [id number] - WC PHYS SUBQ TISS 20 SQ CM ICD-10 Diagnosis Description L97.522 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.621 Type 2 diabetes mellitus with foot ulcer Quantity: 1 Electronic Signature(s) Signed: 04/30/2022 12:54:06 PM By: [name] DO Entered By: [name] on 04/30/2022 11:33:32

## 2022-04-30 NOTE — Progress Notes (Signed)
Office Note     CC:  follow up Requesting Provider:  Harvie Junior, MD  HPI: [name] is a 66 y.o. (1955/09/04) male well-known to me who has severe peripheral arterial disease in bilateral lower extremities.    Surgical history includes: Right below-knee pop to posterior tibial artery bypass using vein, with subsequent balloon angioplasty for assisted primary patency at retained valve. Right TMA. 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  His wife has also been doing well status post hip replacement.  His right TMA is completely healed, left metatarsal amputation healing slowly, clean.  He also denied fever, chills.    Past Medical History:  Diagnosis Date   Diabetes mellitus with peripheral vascular disease    Hypertension    Neuropathy    PTSD (post-traumatic stress disorder)    Sleep apnea     Past Surgical History:  Procedure Laterality Date   ABDOMINAL AORTOGRAM W/LOWER EXTREMITY Right 09/10/2021   Procedure: ABDOMINAL AORTOGRAM W/LOWER EXTREMITY;  Surgeon: [name], MD;  Location: [facility];  Service: Cardiovascular;  Laterality: Right;   ABDOMINAL AORTOGRAM W/LOWER EXTREMITY N/A 12/17/2021   Procedure: ABDOMINAL AORTOGRAM W/LOWER EXTREMITY;  Surgeon: [name], MD;  Location: [facility] [facility];  Service: Cardiovascular;  Laterality: N/A;   ABDOMINAL AORTOGRAM W/LOWER EXTREMITY N/A 12/03/2021   Procedure: ABDOMINAL AORTOGRAM W/LOWER EXTREMITY;  Surgeon: [name], MD;  Location: [place] [facility];  Service: Cardiovascular;  Laterality: N/A;   ABDOMINAL HERNIA REPAIR     AMPUTATION Right 09/19/2021   Procedure: TRANSMETATARSAL AMPUTATION FOOT;  Surgeon: Lorenda Peck, MD;  Location: [facility];  Service: Podiatry;  Laterality: Right;  Surgical team to do local block   AMPUTATION Left 12/20/2021   Procedure: LEFT FOOT  REVISION PARTIAL FIRST RAY AMPUTATION WITH PLACEMENT OF PRESSURE WOUND VAC;  Surgeon: [name], DPM;  Location: [facility];  Service: Podiatry;  Laterality: Left;   AMPUTATION TOE Right 09/15/2021   Procedure: AMPUTATION 4th TOE;  Surgeon: [name], MD;  Location: [place];  Service: Vascular;  Laterality: Right;   AMPUTATION TOE Left 12/18/2021   Procedure: AMPUTATION LEFT BIG TOE; LEFT 5TH RAY PARTIAL AMPUTATION;  Surgeon: [name], DPM;  Location: [place];  Service: Podiatry;  Laterality: Left;   BACK SURGERY     CORONARY ATHERECTOMY N/A 12/08/2021   Procedure: CORONARY ATHERECTOMY;  Surgeon: Waynetta Sandy, MD;  Location: [place] [facility];  Service: Cardiovascular;  Laterality: N/A;  Laser - Lt. PT   FEMORAL-TIBIAL BYPASS GRAFT Right 09/15/2021   Procedure: BYPASS GRAFT BELOW KNEE POPLITEAL ARTERY TO POSTERIOR TIBIAL ARTERY;  Surgeon: [name], MD;  Location: [facility];  Service: Vascular;  Laterality: Right;   IRRIGATION AND DEBRIDEMENT FOOT Right 09/17/2021   Procedure: IRRIGATION AND DEBRIDEMENT FOOT;  Surgeon: Lorenda Peck, MD;  Location: [place] [facility];  Service: Podiatry;  Laterality: Right;  Suregeon will do anesthesia block   KNEE SURGERY     LOWER EXTREMITY ANGIOGRAPHY Left 12/08/2021   Procedure: Lower Extremity Angiography;  Surgeon: Waynetta Sandy, MD;  Location: [facility];  Service: Cardiovascular;  Laterality: Left;   PERIPHERAL VASCULAR BALLOON ANGIOPLASTY  09/10/2021   Procedure: PERIPHERAL VASCULAR BALLOON ANGIOPLASTY;  Surgeon: [name], MD;  Location: [place] [facility];  Service: Cardiovascular;;  rt sfa pta   PERIPHERAL VASCULAR BALLOON  ANGIOPLASTY Right 12/03/2021   Procedure: PERIPHERAL VASCULAR BALLOON ANGIOPLASTY;  Surgeon: [name], MD;  Location: [place] [facility];  Service: Cardiovascular;  Laterality: Right;   PERIPHERAL VASCULAR INTERVENTION Left 12/08/2021   Procedure: PERIPHERAL VASCULAR INTERVENTION;  Surgeon: Waynetta Sandy, MD;  Location: [place] [facility];  Service: Cardiovascular;  Laterality: Left;  LT. SFA   SHOULDER SURGERY     VEIN HARVEST Right 09/15/2021   Procedure: GREATER SAPHENOUS VEIN HARVEST;  Surgeon: [name], MD;  Location: [facility];  Service: Vascular;  Laterality: Right;    Social History   Socioeconomic History   Marital status: Married    Spouse name: Not on file   Number of children: Not on file   Years of education: Not on file   Highest education level: Not on file  Occupational History   Not on file  Tobacco Use   Smoking status: Former    Types: Cigarettes    Passive exposure: Never   Smokeless tobacco: Never  Vaping Use   Vaping Use: Never used  Substance and Sexual Activity   Alcohol use: No   Drug use: Never   Sexual activity: Not on file  Other Topics Concern   Not on file  Social History Narrative   Not on file   Social Determinants of Health   Financial Resource Strain: Not on file  Food Insecurity: Not on file  Transportation Needs: Not on file  Physical Activity: Not on file  Stress: Not on file  Social Connections: Not on file  Intimate Partner Violence: Not on file    Family History  Problem Relation Age of Onset   Stroke Mother    Coronary artery disease Father    COPD Brother     Current Outpatient Medications  Medication Sig Dispense Refill   albuterol (VENTOLIN HFA) 108 (90 Base) MCG/ACT inhaler Inhale 2 puffs into the lungs every 6 (six) hours as needed for wheezing or shortness of breath. 1 each 0   amLODipine (NORVASC) 5 MG tablet Take 1 tablet (5 mg total) by mouth every morning. 30 tablet 0   aspirin EC 81 MG tablet Take 1 tablet (81 mg total) by mouth daily. Swallow whole. 150 tablet 2   atorvastatin (LIPITOR) 80 MG tablet Take 1 tablet (80 mg total) by mouth at bedtime. 30 tablet 0   buPROPion (WELLBUTRIN XL) 300 MG 24 hr tablet Take 1 tablet (300 mg total) by mouth every morning. 30 tablet 0    cholecalciferol (VITAMIN D) 25 MCG (1000 UNIT) tablet Take 1,000 Units by mouth daily.     clopidogrel (PLAVIX) 75 MG tablet Take 1 tablet (75 mg total) by mouth daily. 30 tablet 11   doxycycline (VIBRA-TABS) 100 MG tablet Take 1 tablet (100 mg total) by mouth 2 (two) times daily. 60 tablet 0   empagliflozin (JARDIANCE) 25 MG TABS tablet Take 1 tablet (25 mg total) by mouth daily. 30 tablet 0   fluticasone furoate-vilanterol (BREO ELLIPTA) 100-25 MCG/INH AEPB Inhale 1 puff into the lungs daily. 60 each 5   gabapentin (NEURONTIN) 600 MG tablet Take 600 mg by mouth 2 (two) times daily.     insulin aspart (NOVOLOG) 100 UNIT/ML injection CBG 70 - 120: 0 units CBG 121 - 150: 1 unit CBG 151 - 200: 2 units CBG 201 - 250: 3 units CBG 251 - 300: 5 units CBG 301 - 350: 7 units CBG 351 - 400: 9 units 10 mL 11  insulin glargine-yfgn (SEMGLEE) 100 UNIT/ML injection Inject 0.2 mLs (20 Units total) into the skin 2 (two) times daily. 10 mL 11   levofloxacin (LEVAQUIN) 750 MG tablet Take 1 tablet (750 mg total) by mouth daily. 30 tablet 0   lisinopril (ZESTRIL) 20 MG tablet Take 20 mg by mouth daily.     lurasidone (LATUDA) 40 MG TABS tablet Take 1 tablet (40 mg total) by mouth daily with supper. 30 tablet 0   meclizine (ANTIVERT) 25 MG tablet Take 1 tablet (25 mg total) by mouth 3 (three) times daily as needed for dizziness. 30 tablet 0   metoprolol tartrate (LOPRESSOR) 50 MG tablet Take 1 tablet (50 mg total) by mouth 2 (two) times daily. 60 tablet 0   OLANZapine (ZYPREXA) 2.5 MG tablet Take 1 tablet (2.5 mg total) by mouth at bedtime. 30 tablet 0   omeprazole (PRILOSEC) 20 MG capsule Take 1 capsule (20 mg total) by mouth at bedtime. 30 capsule 0   OXcarbazepine (TRILEPTAL) 150 MG tablet Take 1 tablet (150 mg total) by mouth 2 (two) times daily. 60 tablet 0   senna-docusate (SENOKOT-S) 8.6-50 MG tablet Take 1 tablet by mouth at bedtime as needed for mild constipation.     No current facility-administered  medications for this visit.    Allergies  Allergen Reactions   Gemfibrozil Other (See Comments)    Doesn't know its been awhile   Oxycontin [Oxycodone Hcl] Other (See Comments)    Bad reaction "took me to a place I never wanna go again"   Pork-Derived Products    Simvastatin Other (See Comments)    Says he doesn't know its been awhile     REVIEW OF SYSTEMS:   '[X]'$  denotes positive finding, '[ ]'$  denotes negative finding Cardiac  Comments:  Chest pain or chest pressure:    Shortness of breath upon exertion:    Short of breath when lying flat:    Irregular heart rhythm:        Vascular    Pain in calf, thigh, or hip brought on by ambulation:    Pain in feet at night that wakes you up from your sleep:     Blood clot in your veins:    Leg swelling:         Pulmonary    Oxygen at home:    Productive cough:     Wheezing:         Neurologic    Sudden weakness in arms or legs:     Sudden numbness in arms or legs:     Sudden onset of difficulty speaking or slurred speech:    Temporary loss of vision in one eye:     Problems with dizziness:         Gastrointestinal    Blood in stool:     Vomited blood:         Genitourinary    Burning when urinating:     Blood in urine:        Psychiatric    Major depression:         Hematologic    Bleeding problems:    Problems with blood clotting too easily:        Skin    Rashes or ulcers:        Constitutional    Fever or chills:      PHYSICAL EXAMINATION:  There were no vitals filed for this visit.   General:  WDWN in NAD; vital signs documented  above Gait: Not observed HENT: WNL, normocephalic Pulmonary: normal non-labored breathing , without Rales, rhonchi,  wheezing Cardiac: regular HR Abdomen: soft, NT, no masses Skin: without rashes Vascular Exam/Pulses: Excellent Doppler signals bilaterally. Extremities: Feet bandaged. Musculoskeletal: no muscle wasting or atrophy  Neurologic: A&O X 3;  No focal weakness or  paresthesias are detected Psychiatric:  The pt has Normal affect.   Non-Invasive Vascular Imaging:    Right Graft #1: BK-PTA  +------------------+--------+---------------+--------+--------+                    PSV cm/sStenosis       WaveformComments  +------------------+--------+---------------+--------+--------+  Inflow            83                     biphasic          +------------------+--------+---------------+--------+--------+  Prox Anastomosis  164                    biphasic          +------------------+--------+---------------+--------+--------+  Proximal Graft    104                    biphasic          +------------------+--------+---------------+--------+--------+  Mid Graft         88                     biphasic          +------------------+--------+---------------+--------+--------+  Distal Graft      95                     biphasic          +------------------+--------+---------------+--------+--------+  Distal Anastomosis312     50-70% stenosisbiphasic          +------------------+--------+---------------+--------+--------+  Outflow           250     50-74% stenosisbiphasic          +------------------+--------+---------------+--------+--------+    Left Stent(s):  +---------------+--------+---------------+--------+--------+  SFA            PSV cm/sStenosis       WaveformComments  +---------------+--------+---------------+--------+--------+  Prox to Stent  211     1-49% stenosis biphasic          +---------------+--------+---------------+--------+--------+  Proximal Stent 318     50-99% stenosisbiphasic          +---------------+--------+---------------+--------+--------+  Mid Stent      95                     biphasic          +---------------+--------+---------------+--------+--------+  Distal Stent   191                    biphasic           +---------------+--------+---------------+--------+--------+  Distal to [id number]                    biphasic          +---------------+--------+---------------+--------+--------+   ASSESSMENT/PLAN:: 66 y.o. male here for follow up status post right BK pop-PT bypass, right lower extremity angiogram for assisted bypass patency-retained valve lysis, left lower extremity SFA pop stenting, native posterior tibial artery angioplasty.  ABIs stable from last visit Serial duplex ultrasound demonstrated stable, elevated velocities in the left proximal SFA stent.  This has  been duplex in the past and demonstrated no stenosis.  There is some increase in velocity in the distal anastomosis of his below-knee popliteal to posterior tibial artery venous bypass.  Within the bypass are acceptable. Kirk and I discussed right lower extremity angiogram for primary assisted patency versus shorter follow-up.  Being that the left leg continues heal slowly, and the right TMA is healed.  I agree would be best served with a short interval follow-up.  ABIs reviewed demonstrating stable ABI in the right lower extremity slightly depressed ABI in the left lower extremity.  Left lower extremity was recently angio'd demonstrating no flow-limiting stenosis, therefore we will continue to treat conservatively.  He was asked to call my office should any questions or concerns arise or should wound healing stagnate.  I plan to see him in 3 months with repeat studies.   [name],  [facility] [phone]

## 2022-05-01 ENCOUNTER — Other Ambulatory Visit: Payer: Self-pay | Admitting: Adult Health

## 2022-05-01 ENCOUNTER — Ambulatory Visit: Payer: Medicare HMO | Admitting: Vascular Surgery

## 2022-05-01 ENCOUNTER — Ambulatory Visit (HOSPITAL_COMMUNITY)
Admission: RE | Admit: 2022-05-01 | Discharge: 2022-05-01 | Disposition: A | Discharge status: Home / Self Care | Payer: Medicare HMO | Source: Ambulatory Visit | Attending: Vascular Surgery | Admitting: Vascular Surgery

## 2022-05-01 ENCOUNTER — Ambulatory Visit (INDEPENDENT_AMBULATORY_CARE_PROVIDER_SITE_OTHER)
Admission: RE | Admit: 2022-05-01 | Discharge: 2022-05-01 | Disposition: A | Discharge status: Home / Self Care | Payer: Medicare HMO | Source: Ambulatory Visit | Attending: Vascular Surgery | Admitting: Vascular Surgery

## 2022-05-01 ENCOUNTER — Encounter: Payer: Self-pay | Admitting: Vascular Surgery

## 2022-05-01 VITALS — BP 133/84 | HR 84 | Temp 97.9°F | Resp 20 | Ht 72.0 in | Wt 218.0 lb

## 2022-05-01 DIAGNOSIS — I70223 Atherosclerosis of native arteries of extremities with rest pain, bilateral legs: Secondary | ICD-10-CM

## 2022-05-01 DIAGNOSIS — F333 Major depressive disorder, recurrent, severe with psychotic symptoms: Secondary | ICD-10-CM

## 2022-05-01 DIAGNOSIS — Z9889 Other specified postprocedural states: Secondary | ICD-10-CM

## 2022-05-06 ENCOUNTER — Other Ambulatory Visit: Payer: Self-pay

## 2022-05-06 DIAGNOSIS — I70223 Atherosclerosis of native arteries of extremities with rest pain, bilateral legs: Secondary | ICD-10-CM

## 2022-05-06 DIAGNOSIS — Z9889 Other specified postprocedural states: Secondary | ICD-10-CM

## 2022-05-07 ENCOUNTER — Ambulatory Visit (HOSPITAL_BASED_OUTPATIENT_CLINIC_OR_DEPARTMENT_OTHER): Payer: Medicare HMO | Admitting: Internal Medicine

## 2022-05-08 ENCOUNTER — Encounter (HOSPITAL_BASED_OUTPATIENT_CLINIC_OR_DEPARTMENT_OTHER): Payer: Medicare HMO | Attending: Internal Medicine | Admitting: Internal Medicine

## 2022-05-08 DIAGNOSIS — L97522 Non-pressure chronic ulcer of other part of left foot with fat layer exposed: Secondary | ICD-10-CM | POA: Diagnosis present

## 2022-05-08 DIAGNOSIS — E1142 Type 2 diabetes mellitus with diabetic polyneuropathy: Secondary | ICD-10-CM | POA: Insufficient documentation

## 2022-05-08 DIAGNOSIS — G4733 Obstructive sleep apnea (adult) (pediatric): Secondary | ICD-10-CM | POA: Insufficient documentation

## 2022-05-08 DIAGNOSIS — E11621 Type 2 diabetes mellitus with foot ulcer: Secondary | ICD-10-CM | POA: Insufficient documentation

## 2022-05-08 DIAGNOSIS — I10 Essential (primary) hypertension: Secondary | ICD-10-CM | POA: Diagnosis not present

## 2022-05-08 DIAGNOSIS — M86172 Other acute osteomyelitis, left ankle and foot: Secondary | ICD-10-CM | POA: Diagnosis not present

## 2022-05-08 DIAGNOSIS — L97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 specified severity: Secondary | ICD-10-CM | POA: Diagnosis not present

## 2022-05-09 NOTE — Progress Notes (Signed)
[name] ([id number]) [id number] Page 1 of 9 Visit Report for 05/08/2022 Debridement Details Patient Name: Date of Service: [name], [name]. 05/08/2022 9:15 [name] Medical Record Number: [id number] Patient Account Number: [id number] Date of Birth/Sex: Treating RN: Feb 19, 1956 (66 y.o. M) Primary Care Provider: York Ram Other Clinician: Referring Provider: Treating Provider/Extender: Lily Peer in Treatment: 3 Debridement Performed for Assessment: Wound #4 Left,Dorsal Foot Performed By: Physician Ricard Dillon., MD Debridement Type: Debridement Severity of Tissue Pre Debridement: Fat layer exposed Level of Consciousness (Pre-procedure): Awake and Alert Pre-procedure Verification/Time Out Yes - 09:50 Taken: Start Time: 09:51 Pain Control: Lidocaine 5% topical ointment T Area Debrided (L x W): otal 2 (cm) x 1.2 (cm) = 2.4 (cm) Tissue and other material debrided: Viable, Non-Viable, Slough, Subcutaneous, Skin: Dermis , Skin: Epidermis, Slough Level: Skin/Subcutaneous Tissue Debridement Description: Excisional Instrument: Curette Bleeding: Minimum Hemostasis Achieved: Pressure End Time: 09:55 Procedural Pain: 0 Post Procedural Pain: 0 Response to Treatment: Procedure was tolerated well Level of Consciousness (Post- Awake and Alert procedure): Post Debridement Measurements of Total Wound Length: (cm) 2 Width: (cm) 1.2 Depth: (cm) 0.2 Volume: (cm) 0.377 Character of Wound/Ulcer Post Debridement: Improved Severity of Tissue Post Debridement: Fat layer exposed Post Procedure Diagnosis Same as Pre-procedure Electronic Signature(s) Signed: 05/08/2022 12:29:37 PM By: Linton Ham MD Entered By: Linton Ham on 05/08/2022 10:08:33 -------------------------------------------------------------------------------- HPI Details Patient Name: Date of Service: [name], [name]. 05/08/2022 9:15 [name]  M Medical Record Number: [id number] Patient Account Number: [id number] Date of Birth/Sex: Treating RN: 09/24/1955 (66 y.o. M) Primary Care Provider: York Ram Other Clinician: Referring Provider: Treating Provider/Extender: Lily Peer in Treatment: 3 History of Present Illness [name], [name] ([id number]) [id number] Page 2 of 9 HPI Description: ADMISSION 10/16/20 This is [name] 66 year old man who is [name] type II diabetic. Initially seen by Dr. Unk Lightning of vein and vascular on 09/10/2021 for bilateral lower extremity rest pain right greater than left. His ABIs demonstrated monophasic waveforms at the ankles bilaterally with depressed toe pressures. His ABI in the right was 0.5 and on the left 0.28. There were no obtainable waveforms for TBI's. He underwent angiography on 09/10/2021 and had findings of severe PAD with 95% stenosis of the distal SFA. He also had peroneal and posterior tibial arteries occluded with significant collateralization in the calf there was reconstitution of the posterior tibial artery in the distal third of the calf. Ostia of the anterior tibial artery was not appreciated. He underwent [name] angioplasty of the superficial femoral artery. He required admission to hospital from 09/12/2021 through 09/22/2021 with right foot cellulitis and sepsis. On 3/13 he underwent [name] right below-knee popliteal to posterior tibial bypass using [name] greater saphenous vein. Ultimately however he required [name] right TMA by podiatry which I believe was done on 3/17 for progressive diabetic foot infection. He was discharged to [facility] skilled facility. He arrives in clinic today with [name] large wound area over the entirety of his amputation site extending proximally. The attempted flap closure of the amputation site and his TMA has failed and the wound extends under the attempt to closure. Still some sutures in place. There is an odor but he is not  systemically unwell. He is due for follow-up arterial studies and has an appointment with vascular surgery on 10/28/2021. They are using wet-to-dry dressings at [facility]. Past medical history includes type 2 diabetes with peripheral neuropathy, hypertension, obstructive sleep apnea 4/20; patient presents for follow-up. He  is being discharged from his facility at [facility] today. They have been doing Dakin's wet-to-dry dressings. He states that his fiance can help with dressing changes. He is getting [facility] home [facility] to come out 3 times [name] week once he leaves the facility. He is scheduled to see vein and vascular on 5/12. He has not seen the wound himself. He puts covers over his face for wound exams. He currently denies systemic signs of infection. 4/28; patient admitted to the clinic 2 weeks ago with [name] dehisced right TMA in the setting of type 2 diabetes with significant PAD. He is status post revascularization. He has been discharged from the nursing home he was at and now is at home using Dakin's wet-to-dry dressings daily he has home health. He denies any systemic signs of infection 5/4; patient presents for follow-up. His fiance and home health have been changing his dressings. He currently denies systemic signs of infection. He continues to put sheet covers over his face during our wound encounters because he does not want [name] look at the wound. 5/25; Patient presents for follow-up. He missed his last clinic appointment. He had [name] bone biopsy and culture done at last clinic visit that showed acute osteomyelitis with growth of E. coli, stenotrophomonas maltophilia and enteroccocus faecalis. [name] referral to infectious disease was made. He has not heard from them yet. He has been using Dakin's wet-to-dry dressings. He is now more comfortable with looking at the wound bed. 6/6; the patient comes into clinic today with 2 wounds on the left foot which apparently have been there for several months. This  was not known to assess but was known by Dr. Donzetta Matters. In fact he underwent an angiogram yesterday. He had [name] laser atherectomy of the left posterior tibial artery with [name] 3 mm balloon angioplasty, also [name] stent of the last left SFA. 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 This is [name] lot better than the last time I saw it. He has been using Dakin's wet-to-dry. He has an appointment with infectious disease for [name] bone biopsy which showed E. coli, stenotrophomonas and Enterococcus faecalis. The appointment is for tomorrow 04/14/2022 [name] is an uncontrolled type 2 diabetic on insulin that presents with [name] left foot ulcer. On 12/18/2021 he required [name] left fifth ray amputation. He had revision of this site on 6/17 by Dr. Posey Pronto, podiatry. He states he has had [name] wound VAC on this area for the past 3 weeks. It is unclear what the prior wound care has been. He has [name] history of osteomyelitis to this area and was treated with IV and oral antibiotics For 6 weeks by Dr. Linus Salmons of infectious disease. At his last follow-up on 02/20/2022 his CRP was trending down and antibiotics were stopped. He also has [name] history of peripheral arterial disease status post stenting to the left SFA and balloon angioplasty to the left posterior tibial artery On 12/2021. He has follow-up with vein and vascular at the end of the month. He also has [name] wound to the dorsal aspect of the left foot that he has been placing Betadine on. 10/19; patient presents for follow-up. He had [name] bone biopsy of the left foot at last clinic visit showed osteomyelitis. The bone culture showed Enterobacter cloacae, Enterococcus faecalis, Staphylococcus aureus, viridans group  strep, actinotigum schaelii. He has been referred to infectious disease And has an appointment on October 25. He has been  using Dakin's wet-to-dry dressing to the lateral left foot wound and Hydrofera Blue and Medihoney to the dorsal foot wound. He has no issues or complaints today. He denies systemic signs of infection. 10/26; patient presents for follow-up. He saw Dr. Linus Salmons yesterday with infectious disease and has been prescribed doxycycline and Levaquin for his chronic osteomyelitis. He has been using Dakin's wet-to-dry dressings to the lateral left foot wound and Medihoney and Hydrofera Blue to the dorsal wound. We discussed [name] skin graft for the dorsal foot wound and he would like to proceed with insurance verification. 11/3; patient with [name] wound on the dorsal left foot and [name] substantial postsurgical area on the lateral left foot fifth ray amputation. He is also followed by Dr. Alton Revere of infectious disease. He had [name] bone biopsy from the left lateral foot that showed acute osteomyelitis. Swab culture showed multiple organisms. He has also been revascularized by infectious disease status post left SFA stenting and tibial laser atherectomy and angioplasty. An MRI of the left foot had previously shown osteomyelitis of the fifth met head we have been using Dakin's wet-to-dry to the left lateral foot and Medihoney and Hydrofera Blue to the wound on the dorsal foot. Electronic Signature(s) Signed: 05/08/2022 12:29:37 PM By: Linton Ham MD Entered By: Linton Ham on 05/08/2022 10:10:57 -------------------------------------------------------------------------------- Physical Exam Details Patient Name: Date of Service: [name], [name]. 05/08/2022 9:15 [name] Medical Record Number: [id number] Patient Account Number: [id number] Date of Birth/Sex: Treating RN: 1956-06-10 (66 y.o. M) Primary Care Provider: York Ram Other Clinician: Referring Provider: Treating Provider/Extender: Lily Peer in Treatment: 3 [name], [name] ([id number]) [id number] Page  3 of 9 Constitutional Sitting or standing Blood Pressure is within target range for patient.. Pulse regular and within target range for patient.Marland Kitchen Respirations regular, non-labored and within target range.. Temperature is normal and within the target range for the patient.Marland Kitchen Appears in no distress. Cardiovascular Pedal pulses on the left foot are not robust but the dorsalis pedis and posterior tibial pulses are palpable his foot is warm. Notes Wound exam Left foot on the proximal dorsal left foot there is [name] small oval wound perhaps 2 to 3 mm in depth. Gritty nonviable surface I debrided with [name] #3 curette removing subcutaneous tissue On the left lateral foot [name] substantial wound which is his surgical site. Under illumination the granular tissue at the base here actually looks quite healthy no debridement is required there is no exposed bone. No evidence of surrounding infection Electronic Signature(s) Signed: 05/08/2022 12:29:37 PM By: Linton Ham MD Entered By: Linton Ham on 05/08/2022 10:12:18 -------------------------------------------------------------------------------- Physician Orders Details Patient Name: Date of Service: [name], [name]. 05/08/2022 9:15 [name] Medical Record Number: [id number] Patient Account Number: [id number] Date of Birth/Sex: Treating RN: 1956/03/21 (66 y.o. [name] Primary Care Provider: York Ram Other Clinician: Referring Provider: Treating Provider/Extender: Lily Peer in Treatment: 3 Verbal / Phone Orders: No Diagnosis Coding ICD-10 Coding Code Description [phone] 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 E11.621 Type 2 diabetes mellitus with foot ulcer E11.42 Type 2 diabetes mellitus with diabetic polyneuropathy Follow-up Appointments ppointment in: - week and 1/2 out due to  Providers availability Return [name] Other: - Patient  to consider hyperbaric oxygen therapy. Anesthetic (In clinic) Topical Lidocaine 5% applied to wound bed Cellular or Tissue Based Products Cellular or Tissue Based Product Type: - Run IVR for Grafix for left dorsal foot- pending Bathing/ Shower/ Hygiene May shower with protection but do not get wound dressing(s) wet. Edema Control - Lymphedema / SCD / Other Elevate legs to the level of the heart or above for 30 minutes daily and/or when sitting, [name] frequency of: - 3-4 times [name] day throughout the day. Avoid standing for long periods of time. Off-Loading Open toe surgical shoe to: - wear when standing or walking. Home Health New wound care orders this week; continue Home Health for wound care. May utilize formulary equivalent dressing for wound treatment orders unless otherwise specified. - Continue medihoney and hydrofera blue to dorsal wound. Prisma with saline moisten gauzes backing. Dressing changes to be completed by [place] on Monday / Wednesday / Friday except when patient has scheduled visit at [facility]. Other Home Health Orders/Instructions: - [place] home health. Hyperbaric Oxygen Therapy Evaluate for HBO Therapy [name], [name] ([id number]) [id number] Page 4 of 9 Indication: - Wagner Grade III to left lateral foot 2.5 ATA for 90 Minutes with 2 Five (5) Minute [name] Breaks ir Total Number of Treatments: - 40 One treatments per day (delivered Monday through Friday unless otherwise specified in Special Instructions below): Finger stick Blood Glucose Pre- and Post- HBOT Treatment. Follow Hyperbaric Oxygen Glycemia Protocol Afrin (Oxymetazoline HCL) 0.05% nasal spray - 1 spray in both nostrils daily as needed prior to HBO treatment for difficulty clearing ears Wound Treatment Wound #4 - Foot Wound Laterality: Dorsal, Left Cleanser: Wound Cleanser (Home Health) 1 x Per Day/30 Days Discharge  Instructions: Cleanse the wound with wound cleanser prior to applying [name] clean dressing using gauze sponges, not tissue or cotton balls. Peri-Wound Care: Skin Prep (Home Health) 1 x Per Day/30 Days Discharge Instructions: Use skin prep as directed Prim Dressing: Hydrofera Blue Ready Foam, 4x5 in (Home Health) 1 x Per Day/30 Days ary Discharge Instructions: Apply to wound bed as instructed Prim Dressing: MediHoney Gel, tube 1.5 (oz) ([place]) 1 x Per Day/30 Days ary Discharge Instructions: Apply to wound bed as instructed Secondary Dressing: ABD Pad, 8x10 ([place]) 1 x Per Day/30 Days Discharge Instructions: Apply over primary dressing as directed. Secured With: [employer], 4.5x3.1 (in/yd) (Home Health) 1 x Per Day/30 Days Discharge Instructions: Secure with Kerlix as directed. Secured With: 90M Medipore H Soft Cloth Surgical T ape, 4 x 10 (in/yd) (Home Health) 1 x Per Day/30 Days Discharge Instructions: Secure with tape as directed. Wound #5 - Foot Wound Laterality: Left, Lateral Cleanser: Wound Cleanser (Home Health) 1 x Per Day/30 Days Discharge Instructions: Cleanse the wound with wound cleanser prior to applying [name] clean dressing using gauze sponges, not tissue or cotton balls. Prim Dressing: Promogran Prisma Matrix, 4.34 (sq in) (silver collagen) (Home Health) 1 x Per Day/30 Days ary Discharge Instructions: Moisten collagen with saline or hydrogel Prim Dressing: saline moisten gauze 1 x Per Day/30 Days ary Discharge Instructions: back the prisma with saline moisten gauzes. Secondary Dressing: ABD Pad, 8x10 (Home Health) 1 x Per Day/30 Days Discharge Instructions: Apply over primary dressing as directed. Secured With: [employer], 4.5x3.1 (in/yd) (Home Health) 1 x Per Day/30 Days Discharge Instructions: Secure with Kerlix as directed. Secured With: 90M Medipore H Soft Cloth Surgical T ape, 4 x 10 (in/yd) (Home Health) 1 x Per Day/30 Days Discharge  Instructions:  Secure with tape as directed. GLYCEMIA INTERVENTIONS PROTOCOL PRE-HBO GLYCEMIA INTERVENTIONS ACTION INTERVENTION Obtain pre-HBO capillary blood glucose (ensure 1 physician order is in chart). [name]. Notify HBO physician and await physician orders. 2 If result is 70 mg/dl or below: B. If the result meets the hospital definition of [name] critical result, follow hospital policy. [name]. Give patient an 8 ounce Glucerna Shake, an 8 ounce Ensure, or 8 ounces of [name] Glucerna/Ensure equivalent dietary supplement*. B. Wait 30 minutes. If result is 71 mg/dl to 130 mg/dl: C. Retest patients capillary blood glucose (CBG). D. If result greater than or equal to 110 mg/dl, proceed with HBO. If result less than 110 mg/dl, notify HBO physician and consider holding HBO. If result is 131 mg/dl to 249 mg/dl: [name]. Proceed with HBO. [name]. Notify HBO physician and await physician orders. B. It is recommended to hold HBO and do If result is 250 mg/dl or greater: blood/urine ketone testing. C. If the result meets the hospital definition of [name] critical result, follow hospital policy. POST-HBO GLYCEMIA INTERVENTIONS ACTION INTERVENTION Obtain post HBO capillary blood glucose (ensure 1 [name], [name] ([id number]) [id number] Page 5 of 9 1 physician order is in chart). [name]. Notify HBO physician and await physician orders. 2 If result is 70 mg/dl or below: B. If the result meets the hospital definition of [name] critical result, follow hospital policy. [name]. Give patient an 8 ounce Glucerna Shake, an 8 ounce Ensure, or 8 ounces of [name] Glucerna/Ensure equivalent dietary supplement*. B. Wait 15 minutes for symptoms of If result is 71 mg/dl to 100 mg/dl: hypoglycemia (i.e. nervousness, anxiety, sweating, chills, clamminess, irritability, confusion, tachycardia or dizziness). C. If patient asymptomatic, discharge patient. If patient symptomatic, repeat capillary blood glucose (CBG) and  notify HBO physician. If result is 101 mg/dl to 249 mg/dl: [name]. Discharge patient. [name]. Notify HBO physician and await physician orders. B. It is recommended to do blood/urine ketone If result is 250 mg/dl or greater: testing. C. If the result meets the hospital definition of [name] critical result, follow hospital policy. *Juice or candies are NOT equivalent products. If patient refuses the Glucerna or Ensure, please consult the hospital dietitian for an appropriate substitute. Electronic Signature(s) Signed: 05/08/2022 12:29:37 PM By: Linton Ham MD Signed: 05/08/2022 5:25:29 PM By: [name] RN, [name] Entered By: [name] on 05/08/2022 10:23:32 -------------------------------------------------------------------------------- Problem List Details Patient Name: Date of Service: [name], [name]. 05/08/2022 9:15 [name] Medical Record Number: [id number] Patient Account Number: [id number] Date of Birth/Sex: Treating RN: [birth year] (66 y.o. [name] Primary Care Provider: York Ram Other Clinician: Referring Provider: Treating Provider/Extender: Lily Peer in Treatment: 3 Active Problems ICD-10 Encounter Code Description Active Date MDM Diagnosis [phone] Non-pressure chronic ulcer of other part of left foot with fat layer exposed 04/14/2022 No Yes M86.172 Other acute osteomyelitis, left ankle and foot 05/08/2022 No Yes L97.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 specified 04/14/2022 No Yes severity E11.621 Type 2 diabetes mellitus with foot ulcer 04/14/2022 No Yes E11.42 Type 2 diabetes mellitus with diabetic polyneuropathy 04/14/2022 No Yes Inactive Problems Resolved Problems CRUE, OTERO [name] ([id number]) [id number] Page 6 of 9 Electronic Signature(s) Signed: 05/08/2022 12:29:37 PM By: Linton Ham MD Entered By: Linton Ham on 05/08/2022  10:08:18 -------------------------------------------------------------------------------- Progress Note Details Patient Name: Date of Service: [name], [name]. 05/08/2022 9:15 [name] Medical Record Number: [id number] Patient Account Number: [id number] Date of Birth/Sex: Treating RN:  01-02-1956 (66 y.o. M) Primary Care Provider: York Ram Other Clinician: Referring Provider: Treating Provider/Extender: Lily Peer in Treatment: 3 Subjective History of Present Illness (HPI) ADMISSION 10/16/20 This is [name] 66 year old man who is [name] type II diabetic. Initially seen by Dr. Unk Lightning of vein and vascular on 09/10/2021 for bilateral lower extremity rest pain right greater than left. His ABIs demonstrated monophasic waveforms at the ankles bilaterally with depressed toe pressures. His ABI in the right was 0.5 and on the left 0.28. There were no obtainable waveforms for TBI's. He underwent angiography on 09/10/2021 and had findings of severe PAD with 95% stenosis of the distal SFA. He also had peroneal and posterior tibial arteries occluded with significant collateralization in the calf there was reconstitution of the posterior tibial artery in the distal third of the calf. Ostia of the anterior tibial artery was not appreciated. He underwent [name] angioplasty of the superficial femoral artery. He required admission to hospital from 09/12/2021 through 09/22/2021 with right foot cellulitis and sepsis. On 3/13 he underwent [name] right below-knee popliteal to posterior tibial bypass using [name] greater saphenous vein. Ultimately however he required [name] right TMA by podiatry which I believe was done on 3/17 for progressive diabetic foot infection. He was discharged to [facility] skilled facility. He arrives in clinic today with [name] large wound area over the entirety of his amputation site extending proximally. The attempted flap closure of the amputation site and his TMA has failed and the wound  extends under the attempt to closure. Still some sutures in place. There is an odor but he is not systemically unwell. He is due for follow-up arterial studies and has an appointment with vascular surgery on 10/28/2021. They are using wet-to-dry dressings at [facility]. Past medical history includes type 2 diabetes with peripheral neuropathy, hypertension, obstructive sleep apnea 4/20; patient presents for follow-up. He is being discharged from his facility at [facility] today. They have been doing Dakin's wet-to-dry dressings. He states that his fiance can help with dressing changes. He is getting [facility] home [facility] to come out 3 times [name] week once he leaves the facility. He is scheduled to see vein and vascular on 5/12. He has not seen the wound himself. He puts covers over his face for wound exams. He currently denies systemic signs of infection. 4/28; patient admitted to the clinic 2 weeks ago with [name] dehisced right TMA in the setting of type 2 diabetes with significant PAD. He is status post revascularization. He has been discharged from the nursing home he was at and now is at home using [facility]'s wet-to-dry dressings daily he has home health. He denies any systemic signs of infection 5/4; patient presents for follow-up. His fiance and home health have been changing his dressings. He currently denies systemic signs of infection. He continues to put sheet covers over his face during our wound encounters because he does not want [name] look at the wound. 5/25; Patient presents for follow-up. He missed his last clinic appointment. He had [name] bone biopsy and culture done at last clinic visit that showed acute osteomyelitis with growth of E. coli, stenotrophomonas maltophilia and enteroccocus faecalis. [name] referral to infectious disease was made. He has not heard from them yet. He has been using Dakin's wet-to-dry dressings. He is now more comfortable with looking at the wound bed. 6/6; the patient comes into  clinic today with 2 wounds on the left foot which apparently have been there for several months. This was not known  to assess but was known by Dr. Donzetta Matters. In fact he underwent an angiogram yesterday. He had [name] laser atherectomy of the left posterior tibial artery with [name] 3 mm balloon angioplasty, also [name] stent of the last left SFA. 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 This is [name] lot better than the last time I saw it. He has been using Dakin's wet-to-dry. He has an appointment with infectious disease for [name] bone biopsy which showed E. coli, stenotrophomonas and Enterococcus faecalis. The appointment is for tomorrow 04/14/2022 Mr. Abigail Marsiglia [name] is an uncontrolled type 2 diabetic on insulin that presents with [name] left foot ulcer. On 12/18/2021 he required [name] left fifth ray amputation. He had revision of this site on 6/17 by Dr. Posey Pronto, podiatry. He states he has had [name] wound VAC on this area for the past 3 weeks. It is unclear what the prior wound care has been. He has [name] history of osteomyelitis to this area and was treated with IV and oral antibiotics For 6 weeks by Dr. Linus Salmons of infectious disease. At his last follow-up on 02/20/2022 his CRP was trending down and antibiotics were stopped. He also has [name] history of peripheral arterial disease status post stenting to the left SFA and balloon angioplasty to the left posterior tibial artery On 12/2021. He has follow-up with vein and vascular at the end of the month. He also has [name] wound to the dorsal aspect of the left foot that he has been placing Betadine on. 10/19; patient presents for follow-up. He had [name] bone biopsy of the left foot at last clinic visit showed osteomyelitis. The bone culture showed Enterobacter cloacae, Enterococcus faecalis, Staphylococcus aureus, viridans group strep, actinotigum  schaelii. He has been referred to infectious disease And has an appointment on October 25. He has been using Dakin's wet-to-dry dressing to the lateral left foot wound and Hydrofera Blue and Medihoney to the dorsal foot wound. He has no issues or complaints today. He denies systemic signs of infection. 10/26; patient presents for follow-up. He saw Dr. Linus Salmons yesterday with infectious disease and has been prescribed doxycycline and Levaquin for his chronic osteomyelitis. He has been using Dakin's wet-to-dry dressings to the lateral left foot wound and Medihoney and Hydrofera Blue to the dorsal wound. We discussed [name] skin graft for the dorsal foot wound and he would like to proceed with insurance verification. 11/3; patient with [name] wound on the dorsal left foot and [name] substantial postsurgical area on the lateral left foot fifth ray amputation. He is also followed by Dr. [name] ([id number]) [id number] Page 7 of 9 comar of infectious disease. He had [name] bone biopsy from the left lateral foot that showed acute osteomyelitis. Swab culture showed multiple organisms. He has also been revascularized by infectious disease status post left SFA stenting and tibial laser atherectomy and angioplasty. An MRI of the left foot had previously shown osteomyelitis of the fifth met head we have been using Dakin's wet-to-dry to the left lateral foot and Medihoney and Hydrofera Blue to the wound on the dorsal foot. Objective Constitutional Sitting or standing Blood Pressure is within target range for patient.. Pulse regular and within target range for patient.Marland Kitchen Respirations regular, non-labored and within target range.. Temperature is normal and within the target range for the patient.Marland Kitchen Appears in no  distress. Vitals Time Taken: 9:25 AM, Height: 72 in, Weight: 220 lbs, BMI: 29.8, Temperature: 97.6 F, Pulse: 84 bpm, Respiratory Rate: 18 breaths/min, Blood Pressure: 137/76 mmHg, Capillary  Blood Glucose: 132 mg/dl. Cardiovascular Pedal pulses on the left foot are not robust but the dorsalis pedis and posterior tibial pulses are palpable his foot is warm. General Notes: Wound exam oo Left foot on the proximal dorsal left foot there is [name] small oval wound perhaps 2 to 3 mm in depth. Gritty nonviable surface I debrided with [name] #3 curette removing subcutaneous tissue oo On the left lateral foot [name] substantial wound which is his surgical site. Under illumination the granular tissue at the base here actually looks quite healthy no debridement is required there is no exposed bone. No evidence of surrounding infection Integumentary (Hair, Skin) Wound #4 status is Open. Original cause of wound was Gradually Appeared. The date acquired was: 09/03/2021. The wound has been in treatment 3 weeks. The wound is located on the Left,Dorsal Foot. The wound measures 2cm length x 1.2cm width x 0.2cm depth; 1.885cm^2 area and 0.377cm^3 volume. There is Fat Layer (Subcutaneous Tissue) exposed. There is no tunneling or undermining noted. There is [name] medium amount of serosanguineous drainage noted. The wound margin is distinct with the outline attached to the wound base. There is large (67-100%) pink, pale granulation within the wound bed. There is [name] small (1-33%) amount of necrotic tissue within the wound bed including Eschar. The periwound skin appearance had no abnormalities noted for moisture. The periwound skin appearance had no abnormalities noted for color. The periwound skin appearance did not exhibit: Callus, Crepitus, Excoriation, Induration, Rash, Scarring. Periwound temperature was noted as No Abnormality. Wound #5 status is Open. Original cause of wound was Surgical Injury. The date acquired was: 12/20/2021. The wound has been in treatment 3 weeks. The wound is located on the Left,Lateral Foot. The wound measures 6.5cm length x 1.5cm width x 0.5cm depth; 7.658cm^2 area and 3.829cm^3 volume. There  is bone and Fat Layer (Subcutaneous Tissue) exposed. There is no tunneling or undermining noted. There is [name] medium amount of serosanguineous drainage noted. The wound margin is epibole. There is large (67-100%) red, pink granulation within the wound bed. There is [name] small (1-33%) amount of necrotic tissue within the wound bed including Adherent Slough. The periwound skin appearance had no abnormalities noted for color. The periwound skin appearance exhibited: Callus. The periwound skin appearance did not exhibit: Crepitus, Excoriation, Induration, Rash, Scarring, Dry/Scaly, Maceration. Periwound temperature was noted as No Abnormality. Assessment Active Problems ICD-10 Non-pressure chronic ulcer of other part of left foot with fat layer exposed Other acute osteomyelitis, left ankle and foot Non-pressure chronic ulcer of other part of left foot with other specified severity Type 2 diabetes mellitus with foot ulcer Type 2 diabetes mellitus with diabetic polyneuropathy Procedures Wound #4 Pre-procedure diagnosis of Wound #4 is [name] Diabetic Wound/Ulcer of the Lower Extremity located on the Left,Dorsal Foot .Severity of Tissue Pre Debridement is: Fat layer exposed. There was [name] Excisional Skin/Subcutaneous Tissue Debridement with [name] total area of 2.4 sq cm performed by Ricard Dillon., MD. With the following instrument(s): Curette to remove Viable and Non-Viable tissue/material. Material removed includes Subcutaneous Tissue, Slough, Skin: Dermis, and Skin: Epidermis after achieving pain control using Lidocaine 5% topical ointment. [name] time out was conducted at 09:50, prior to the start of the procedure. [name] Minimum amount of bleeding was controlled with Pressure. The procedure was tolerated well with [name] pain level  of 0 throughout and [name] pain level of 0 following the procedure. Post Debridement Measurements: 2cm length x 1.2cm width x 0.2cm depth; 0.377cm^3 volume. Character of Wound/Ulcer Post Debridement  is improved. Severity of Tissue Post Debridement is: Fat layer exposed. Post procedure Diagnosis Wound #4: Same as Pre-Procedure Plan Follow-up Appointments: Return Appointment in: - week and 1/2 out due to Providers availability [name], [name] ([id number]) [id number] Page 8 of 9 Other: - Patient to consider hyperbaric oxygen therapy. Anesthetic: (In clinic) Topical Lidocaine 5% applied to wound bed Cellular or Tissue Based Products: Cellular or Tissue Based Product Type: - Run IVR for Grafix for left dorsal foot- pending Bathing/ Shower/ Hygiene: May shower with protection but do not get wound dressing(s) wet. Edema Control - Lymphedema / SCD / Other: Elevate legs to the level of the heart or above for 30 minutes daily and/or when sitting, [name] frequency of: - 3-4 times [name] day throughout the day. Avoid standing for long periods of time. Off-Loading: Open toe surgical shoe to: - wear when standing or walking. Home Health: New wound care orders this week; continue Home Health for wound care. May utilize formulary equivalent dressing for wound treatment orders unless otherwise specified. - Continue medihoney and hydrofera blue to dorsal wound. Prisma with saline moisten gauzes backing. Dressing changes to be completed by [facility] on Monday / Wednesday / Friday except when patient has scheduled visit at [facility]. Other Home Health Orders/Instructions: - [place] home health. Hyperbaric Oxygen Therapy: Evaluate for HBO Therapy Indication: - Wagner Grade III to left lateral foot 2.5 ATA for 90 Minutes with 2 Five (5) Minute Air Breaks T Number of Treatments: - 40 otal One treatments per day (delivered Monday through Friday unless otherwise specified in Special Instructions below): Finger stick Blood Glucose Pre- and Post- HBOT Treatment. Follow Hyperbaric Oxygen Glycemia Protocol Afrin (Oxymetazoline HCL) 0.05% nasal spray - 1 spray in both nostrils  daily as needed prior to HBO treatment for difficulty clearing ears WOUND #4: - Foot Wound Laterality: Dorsal, Left Cleanser: Wound Cleanser (Home Health) 1 x Per Day/30 Days Discharge Instructions: Cleanse the wound with wound cleanser prior to applying [name] clean dressing using gauze sponges, not tissue or cotton balls. Peri-Wound Care: Skin Prep (Home Health) 1 x Per Day/30 Days Discharge Instructions: Use skin prep as directed Prim Dressing: Hydrofera Blue Ready Foam, 4x5 in (Home Health) 1 x Per Day/30 Days ary Discharge Instructions: Apply to wound bed as instructed Prim Dressing: MediHoney Gel, tube 1.5 (oz) ([place]) 1 x Per Day/30 Days ary Discharge Instructions: Apply to wound bed as instructed Secondary Dressing: ABD Pad, 8x10 ([place]) 1 x Per Day/30 Days Discharge Instructions: Apply over primary dressing as directed. Secured With: [employer], 4.5x3.1 (in/yd) (Home Health) 1 x Per Day/30 Days Discharge Instructions: Secure with Kerlix as directed. Secured With: 58M Medipore H Soft Cloth Surgical T ape, 4 x 10 (in/yd) (Home Health) 1 x Per Day/30 Days Discharge Instructions: Secure with tape as directed. WOUND #5: - Foot Wound Laterality: Left, Lateral Cleanser: Wound Cleanser (Home Health) 1 x Per Day/30 Days Discharge Instructions: Cleanse the wound with wound cleanser prior to applying [name] clean dressing using gauze sponges, not tissue or cotton balls. Prim Dressing: Promogran Prisma Matrix, 4.34 (sq in) (silver collagen) (Home Health) 1 x Per Day/30 Days ary Discharge Instructions: Moisten collagen with saline or hydrogel Prim Dressing: saline moisten gauze 1 x Per Day/30 Days ary Discharge Instructions: back the prisma with  saline moisten gauzes. Secondary Dressing: ABD Pad, 8x10 (Home Health) 1 x Per Day/30 Days Discharge Instructions: Apply over primary dressing as directed. Secured With: [employer], 4.5x3.1 (in/yd) (Home Health) 1 x Per Day/30  Days Discharge Instructions: Secure with Kerlix as directed. Secured With: 40M Medipore H Soft Cloth Surgical T ape, 4 x 10 (in/yd) (Home Health) 1 x Per Day/30 Days Discharge Instructions: Secure with tape as directed. 1. Debridement of the dorsal foot wound to [name] healthier surface 2. I agree with consideration of [name] skin substitute especially for the area on the left lateral foot 3. The patient is on Levaquin and doxycycline as directed by Dr. Linus Salmons #4 he has been revascularized and is recently been reviewed by Dr. Unk Lightning on 05/01/2022 #5 the patient could benefit from hyperbaric oxygen therapy and I discussed this with him today. He seems interested in proceeding although he was somewhat concerned about the frequency of visits i.e. 5 days [name] week. We went over possible beneficial effects and side effects 6. I looked at an EKG and chest x-ray. There does not seem to be an obvious contraindication. Electronic Signature(s) Signed: 05/08/2022 11:16:53 AM By: Linton Ham MD Entered By: Linton Ham on 05/08/2022 11:16:53 -------------------------------------------------------------------------------- SuperBill Details Patient Name: Date of Service: [name], [name]. 05/08/2022 Medical Record Number: [id number] Patient Account Number: [id number] Date of Birth/Sex: Treating RN: 09/29/1955 (66 y.o. [name] Primary Care Provider: York Ram Other Clinician: Referring Provider: Treating Provider/Extender: Lily Peer in Treatment: 3 [name], [name] ([id number]) [id number] Page 9 of 9 Diagnosis Coding ICD-10 Codes Code Description [phone] 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 E11.621 Type 2 diabetes mellitus with foot ulcer E11.42 Type 2 diabetes mellitus with diabetic  polyneuropathy Facility Procedures : CPT4 Code: [id number] Description: 23536 - DEB SUBQ TISSUE 20 SQ CM/< ICD-10 Diagnosis Description L97.522 Non-pressure chronic ulcer of other part of left foot with fat layer exposed Modifier: Quantity: 1 Physician Procedures : CPT4 Code Description Modifier [id number] - WC PHYS SUBQ TISS 20 SQ CM ICD-10 Diagnosis Description L97.522 Non-pressure chronic ulcer of other part of left foot with fat layer exposed Quantity: 1 Electronic Signature(s) Signed: 05/08/2022 12:29:37 PM By: Linton Ham MD Entered By: Linton Ham on 05/08/2022 10:21:14

## 2022-05-11 NOTE — Progress Notes (Signed)
OLLIVANDER, SEE [name] ([id number]) [id number] Page 1 of 10 Visit Report for 05/08/2022 Arrival Information Details Patient Name: Date of Service: [name], [name]. 05/08/2022 9:15 [name] Medical Record Number: [id number] Patient Account Number: [id number] Date of Birth/Sex: Treating [name]: June 23, 1956 (66 y.o. M) Primary Care [name]: [name] Other Clinician: Referring [name]: Treating [name]/Extender: [name] in [name]: 3 Visit Information History Since Last Visit Added or deleted any medications: No Patient Arrived: Ambulatory Any new allergies or adverse reactions: No Arrival Time: 09:24 Had [name] fall or experienced change in No Accompanied By: friend activities of daily living that may affect Transfer Assistance: None risk of falls: Patient Identification Verified: Yes Signs or symptoms of abuse/neglect since last visito No Secondary Verification Process Completed: Yes Hospitalized since last visit: No Patient Requires Transmission-Based Precautions: No Implantable device outside of the clinic excluding No Patient Has Alerts: No cellular tissue based products placed in the center since last visit: Has Dressing in Place as Prescribed: Yes Pain Present Now: No Electronic Signature(s) Signed: 05/08/2022 12:37:25 PM By: [name] Entered By: [name] on 05/08/2022 09:25:46 -------------------------------------------------------------------------------- Encounter Discharge Information Details Patient Name: Date of Service: [name], [name]. 05/08/2022 9:15 [name] Medical Record Number: [id number] Patient Account Number: [id number] Date of Birth/Sex: Treating [name]: May 06, 1956 (66 y.o. [name] Primary Care [name]: [name] Other Clinician: Referring Zamar Odwyer: Treating Galena Logie/Extender: [name] in [name]: 3 Encounter Discharge Information Items Post  Procedure Vitals Discharge Condition: Stable Temperature (F): 97.6 Ambulatory Status: Cane Pulse (bpm): 84 Discharge Destination: Home Respiratory Rate (breaths/min): 18 Transportation: Private Auto Blood Pressure (mmHg): 137/76 Accompanied By: family member Schedule Follow-up Appointment: Yes Clinical Summary of Care: Electronic Signature(s) Signed: 05/11/2022 4:40:13 PM By: [name] Entered By: [name] on 05/08/2022 10:25:36 [name] ([id number]) [id number] Page 2 of 10 -------------------------------------------------------------------------------- Lower Extremity Assessment Details Patient Name: Date of Service: [name], [name]. 05/08/2022 9:15 [name] Medical Record Number: [id number] Patient Account Number: [id number] Date of Birth/Sex: Treating [name]: 1955-08-28 (66 y.o. M) Primary Care [name]: [name] Other Clinician: Referring [name]: Treating [name]/Extender: [name]: 3 Edema Assessment Assessed: [Left: No] [Right: No] Edema: [Left: Ye] [Right: s] Calf Left: Right: Point of Measurement: 36 cm From Medial Instep 38.5 cm Ankle Left: Right: Point of Measurement: 9 cm From Medial Instep 23.7 cm Electronic Signature(s) Signed: 05/08/2022 12:37:25 PM By: [name] Entered By: [name] on 05/08/2022 09:38:20 -------------------------------------------------------------------------------- Multi Wound Chart Details Patient Name: Date of Service: [name], [name]. 05/08/2022 9:15 [name] Medical Record Number: [id number] Patient Account Number: [id number] Date of Birth/Sex: Treating [name]: [birth year] (66 y.o. M) Primary Care Madelene Kaatz: [name] Other Clinician: Referring Berlinda Farve: Treating Azoria Abbett/Extender: [name] in [name]: 3 Vital Signs Height(in): 72 Capillary Blood Glucose(mg/dl): 132 Weight(lbs):  220 Pulse(bpm): 27 Body Mass Index(BMI): 29.8 Blood Pressure(mmHg): 137/76 Temperature(F): 97.6 Respiratory Rate(breaths/min): 18 [4:Photos:] [[name]:[name]] Left, Dorsal Foot Left, Lateral Foot [name] Wound Location: Gradually Appeared Surgical Injury [name] Wounding Event: Diabetic Wound/Ulcer of the Lower Diabetic Wound/Ulcer of the Lower [name] Primary Etiology: Extremity Extremity [name] Open Surgical Wound [name] Secondary Etiology: Asthma, Sleep Apnea, Hypertension, Asthma, Sleep Apnea, Hypertension, [name] Comorbid HistoryISAIYAH, [name] ([id number]) [id number] Page 3 of 10 Peripheral Venous Disease, Type II Peripheral Venous Disease, Type II Diabetes, Osteomyelitis, Neuropathy Diabetes, Osteomyelitis, Neuropathy 09/03/2021 12/20/2021 [name] Date Acquired: 3 3 [name] Weeks  of [name]: Open Open [name] Wound Status: No No [name] Wound Recurrence: 2x1.2x0.2 6.5x1.5x0.5 [name] Measurements L x W x D (cm) 1.885 7.658 [name] [name] (cm) : rea 0.377 3.829 [name] Volume (cm) : 20.00% 23.40% [name] % Reduction in [name] rea: 20.00% 23.40% [name] % Reduction in Volume: Grade 3 Grade 3 [name] Classification: Medium Medium [name] Exudate [name] mount: Serosanguineous Serosanguineous [name] Exudate Type: red, brown red, brown [name] Exudate Color: Distinct, outline attached Epibole [name] Wound Margin: Large (67-100%) Large (67-100%) [name] Granulation [name] mount: Pink, Pale Red, Pink [name] Granulation Quality: Small (1-33%) Small (1-33%) [name] Necrotic [name] mount: Eschar Adherent Slough [name] Necrotic Tissue: Fat Layer (Subcutaneous Tissue): Yes Fat Layer (Subcutaneous Tissue): Yes [name] Exposed Structures: Fascia: No Bone: Yes Tendon: No Fascia: No Muscle: No Tendon: No Joint: No Muscle: No Bone: No Joint: No Small (1-33%) Small (1-33%) [name] Epithelialization: Debridement - Excisional [name] [name] Debridement: Pre-procedure Verification/Time Out 09:50 [name] [name] Taken: Lidocaine 5% topical ointment [name] [name] Pain  Control: Subcutaneous, Slough [name] [name] Tissue Debrided: Skin/Subcutaneous Tissue [name] [name] Level: 2.4 [name] [name] Debridement [name] (sq cm): rea Curette [name] [name] Instrument: Minimum [name] [name] Bleeding: Pressure [name] [name] Hemostasis [name] chieved: 0 [name] [name] Procedural Pain: 0 [name] [name] Post Procedural Pain: Procedure was tolerated well [name] [name] Debridement [name] Response: 2x1.2x0.2 [name] [name] Post Debridement Measurements L x W x D (cm) 0.377 [name] [name] Post Debridement Volume: (cm) Excoriation: No Callus: Yes [name] Periwound Skin Texture: Induration: No Excoriation: No Callus: No Induration: No Crepitus: No Crepitus: No Rash: No Rash: No Scarring: No Scarring: No Maceration: No Maceration: No [name] Periwound Skin Moisture: Dry/Scaly: No Dry/Scaly: No Atrophie [name]: No Atrophie [name]: No [name] Periwound Skin Color: Cyanosis: No Cyanosis: No Ecchymosis: No Ecchymosis: No Erythema: No Erythema: No Hemosiderin Staining: No Hemosiderin Staining: No Mottled: No Mottled: No Pallor: No Pallor: No Rubor: No Rubor: No No Abnormality No Abnormality [name] Temperature: Debridement [name] [name] Procedures Performed: [name] Notes Wound #4 (Foot) Wound Laterality: Dorsal, Left Cleanser Wound Cleanser Discharge Instruction: Cleanse the wound with wound cleanser prior to applying [name] clean dressing using gauze sponges, not tissue or cotton balls. Peri-Wound Care Skin Prep Discharge Instruction: Use skin prep as directed Topical Primary Dressing Hydrofera Blue Ready Foam, 4x5 in Discharge Instruction: Apply to wound bed as instructed MediHoney Gel, tube 1.5 (oz) Discharge Instruction: Apply to wound bed as instructed Secondary Dressing [name], [name] ([id number]) [id number] Page 4 of 10 ABD Pad, 8x10 Discharge Instruction: Apply over primary dressing as directed. Secured With [employer], 4.5x3.1 (in/yd) Discharge Instruction: Secure with Kerlix  as directed. 49M Medipore H Soft Cloth Surgical T ape, 4 x 10 (in/yd) Discharge Instruction: Secure with tape as directed. Compression Wrap Compression Stockings Add-Ons Wound #5 (Foot) Wound Laterality: Left, Lateral Cleanser Wound Cleanser Discharge Instruction: Cleanse the wound with wound cleanser prior to applying [name] clean dressing using gauze sponges, not tissue or cotton balls. Peri-Wound Care Topical Primary Dressing Promogran Prisma Matrix, 4.34 (sq in) (silver collagen) Discharge Instruction: Moisten collagen with saline or hydrogel saline moisten gauze Discharge Instruction: back the prisma with saline moisten gauzes. Secondary Dressing ABD Pad, 8x10 Discharge Instruction: Apply over primary dressing as directed. Secured With [employer], 4.5x3.1 (in/yd) Discharge Instruction: Secure with Kerlix as directed. 49M Medipore H Soft Cloth Surgical T ape, 4 x 10 (in/yd) Discharge Instruction: Secure with tape as directed. Compression Wrap Compression Stockings Add-Ons Electronic Signature(s) Signed: 05/08/2022 12:29:37 PM By: Linton Ham MD Entered By: Linton Ham  on 05/08/2022 10:08:26 -------------------------------------------------------------------------------- [place] Details Patient Name: Date of Service: [name], [name]. 05/08/2022 9:15 [name] Medical Record Number: [id number] Patient Account Number: [id number] Date of Birth/Sex: Treating [name]: 10-24-1955 (66 y.o. [name] Primary Care Daniyal Tabor: [name] Other Clinician: Referring Arzell Mcgeehan: Treating Samier Jaco/Extender: [name] in [name]: 3 Active Inactive Osteomyelitis [name], [name] ([id number]) [id number] Page 5 of 10 Nursing Diagnoses: Infection: osteomyelitis Goals: Diagnostic evaluation for osteomyelitis completed as ordered Date Initiated: 04/23/2022 Target Resolution Date: 07/03/2022 Goal  Status: Active Signs and symptoms for osteomyelitis will be recognized and promptly addressed Date Initiated: 04/23/2022 Target Resolution Date: 06/25/2022 Goal Status: Active Interventions: Assess for signs and symptoms of osteomyelitis resolution every visit Provide education on osteomyelitis Screen for HBO [name] Activities: Biopsy : 04/16/2022 Consult for HBO : 04/23/2022 Surgical debridement : 04/23/2022 Systemic antibiotics : 04/23/2022 T ordered outside of clinic : 04/23/2022 est Notes: Wound/Skin Impairment Nursing Diagnoses: Knowledge deficit related to ulceration/compromised skin integrity Goals: Patient/caregiver will verbalize understanding of skin care regimen Date Initiated: 04/14/2022 Target Resolution Date: 06/24/2022 Goal Status: Active Interventions: Assess patient/caregiver ability to perform ulcer/skin care regimen upon admission and as needed Assess ulceration(s) every visit Provide education on ulcer and skin care Notes: Electronic Signature(s) Signed: 05/08/2022 5:25:29 PM By: [name] [name], [name] Entered By: [name] on 05/08/2022 09:40:27 -------------------------------------------------------------------------------- Pain Assessment Details Patient Name: Date of Service: [name], [name]. 05/08/2022 9:15 [name] Medical Record Number: [id number] Patient Account Number: [id number] Date of Birth/Sex: Treating [name]: 04-21-1956 (66 y.o. M) Primary Care Tauriel Scronce: [name] Other Clinician: Referring Nasiya Pascual: Treating Aleksa Catterton/Extender: [name] in [name]: 3 Active Problems Location of Pain Severity and Description of Pain Patient Has Paino No Site Locations [name], [name] ([id number]) [id number] Page 6 of 10 Pain Management and Medication Current Pain Management: Notes discomfort Electronic Signature(s) Signed: 05/08/2022 12:37:25 PM By: [name] Entered By:  [name] on 05/08/2022 09:28:09 -------------------------------------------------------------------------------- Patient/Caregiver Education Details Patient Name: Date of Service: [name], [name]. 11/3/2023andnbsp9:15 [name] Medical Record Number: [id number] Patient Account Number: [id number] Date of Birth/Gender: Treating [name]: June 24, 1956 (66 y.o. [name] Primary Care Physician: [name] Other Clinician: Referring Physician: Treating Physician/Extender: [name] in [name]: 3 Education Assessment Education Provided To: Patient Education Topics Provided Wound/Skin Impairment: Handouts: Skin Care Do's and Dont's Methods: Explain/Verbal Responses: Reinforcements needed Electronic Signature(s) Signed: 05/08/2022 5:25:29 PM By: [name] [name], [name] Entered By: [name] on 05/08/2022 09:40:39 -------------------------------------------------------------------------------- Wound Assessment Details Patient Name: Date of Service: [name], [name]. 05/08/2022 9:15 [name], [name] ([id number]) [id number] Page 7 of 10 Medical Record Number: [id number] Patient Account Number: [id number] Date of Birth/Sex: Treating [name]: [birth year] (66 y.o. M) Primary Care Kiran Carline: [name] Other Clinician: Referring Samentha Perham: Treating Tarez Bowns/Extender: [name] in [name]: 3 Wound Status Wound Number: 4 Primary Diabetic Wound/Ulcer of the Lower Extremity Etiology: Wound Location: Left, Dorsal Foot Wound Open Wounding Event: Gradually Appeared Status: Date Acquired: 09/03/2021 Comorbid Asthma, Sleep Apnea, Hypertension, Peripheral Venous Disease, Weeks Of [name]: 3 History: Type II Diabetes, Osteomyelitis, Neuropathy Clustered Wound: No Photos Wound Measurements Length: (cm) 2 Width: (cm) 1.2 Depth: (cm) 0.2 Area: (cm) 1.885 Volume: (cm) 0.377 % Reduction in  Area: 20% % Reduction in Volume: 20% Epithelialization: Small (1-33%) Tunneling: No Undermining: No Wound Description Classification: Grade 3 Wound Margin: Distinct, outline attached Exudate Amount: Medium  Exudate Type: Serosanguineous Exudate Color: red, brown Foul Odor After Cleansing: No Slough/Fibrino Yes Wound Bed Granulation Amount: Large (67-100%) Exposed Structure Granulation Quality: Pink, Pale Fascia Exposed: No Necrotic Amount: Small (1-33%) Fat Layer (Subcutaneous Tissue) Exposed: Yes Necrotic Quality: Eschar Tendon Exposed: No Muscle Exposed: No Joint Exposed: No Bone Exposed: No Periwound Skin Texture Texture Color No Abnormalities Noted: No No Abnormalities Noted: Yes Callus: No Temperature / Pain Crepitus: No Temperature: No Abnormality Excoriation: No Induration: No Rash: No Scarring: No Moisture No Abnormalities Noted: Yes [name] Notes Wound #4 (Foot) Wound Laterality: Dorsal, Left Cleanser Wound Cleanser Discharge Instruction: Cleanse the wound with wound cleanser prior to applying [name] clean dressing using gauze sponges, not tissue or cotton balls. Peri-Wound Care Skin Prep [name], [name] ([id number]) [id number] Page 8 of 10 Discharge Instruction: Use skin prep as directed Topical Primary Dressing Hydrofera Blue Ready Foam, 4x5 in Discharge Instruction: Apply to wound bed as instructed MediHoney Gel, tube 1.5 (oz) Discharge Instruction: Apply to wound bed as instructed Secondary Dressing ABD Pad, 8x10 Discharge Instruction: Apply over primary dressing as directed. Secured With [employer], 4.5x3.1 (in/yd) Discharge Instruction: Secure with Kerlix as directed. 65M Medipore H Soft Cloth Surgical T ape, 4 x 10 (in/yd) Discharge Instruction: Secure with tape as directed. Compression Wrap Compression Stockings Add-Ons Electronic Signature(s) Signed: 05/08/2022 12:37:25 PM By: [name] Entered By:  [name] on 05/08/2022 09:39:15 -------------------------------------------------------------------------------- Wound Assessment Details Patient Name: Date of Service: [name], [name]. 05/08/2022 9:15 [name] Medical Record Number: [id number] Patient Account Number: [id number] Date of Birth/Sex: Treating [name]: 02-02-1956 (66 y.o. M) Primary Care Domonic Kimball: [name] Other Clinician: Referring Haiven Nardone: Treating Peng Thorstenson/Extender: [name] in [name]: 3 Wound Status Wound Number: 5 Primary Diabetic Wound/Ulcer of the Lower Extremity Etiology: Wound Location: Left, Lateral Foot Secondary Open Surgical Wound Wounding Event: Surgical Injury Etiology: Date Acquired: 12/20/2021 Wound Open Weeks Of [name]: 3 Status: Clustered Wound: No Comorbid Asthma, Sleep Apnea, Hypertension, Peripheral Venous Disease, History: Type II Diabetes, Osteomyelitis, Neuropathy Photos Wound Measurements Length: (cm) 6.5 Width: (cm) 1.5 Depth: (cm) 0.5 [name], [name] ([id number]) Area: (cm) 7.658 Volume: (cm) 3.829 % Reduction in Area: 23.4% % Reduction in Volume: 23.4% Epithelialization: Small (1-33%) [id number] Page 9 of 10 Tunneling: No Undermining: No Wound Description Classification: Grade 3 Wound Margin: Epibole Exudate Amount: Medium Exudate Type: Serosanguineous Exudate Color: red, brown Foul Odor After Cleansing: No Slough/Fibrino Yes Wound Bed Granulation Amount: Large (67-100%) Exposed Structure Granulation Quality: Red, Pink Fascia Exposed: No Necrotic Amount: Small (1-33%) Fat Layer (Subcutaneous Tissue) Exposed: Yes Necrotic Quality: Adherent Slough Tendon Exposed: No Muscle Exposed: No Joint Exposed: No Bone Exposed: Yes Periwound Skin Texture Texture Color No Abnormalities Noted: No No Abnormalities Noted: Yes Callus: Yes Temperature / Pain Crepitus: No Temperature: No  Abnormality Excoriation: No Induration: No Rash: No Scarring: No Moisture No Abnormalities Noted: No Dry / Scaly: No Maceration: No [name] Notes Wound #5 (Foot) Wound Laterality: Left, Lateral Cleanser Wound Cleanser Discharge Instruction: Cleanse the wound with wound cleanser prior to applying [name] clean dressing using gauze sponges, not tissue or cotton balls. Peri-Wound Care Topical Primary Dressing Promogran Prisma Matrix, 4.34 (sq in) (silver collagen) Discharge Instruction: Moisten collagen with saline or hydrogel saline moisten gauze Discharge Instruction: back the prisma with saline moisten gauzes. Secondary Dressing ABD Pad, 8x10 Discharge Instruction: Apply over primary dressing as directed. Secured With [employer], 4.5x3.1 (in/yd) Discharge Instruction: Secure with Kerlix as directed. 65M Medipore  H Soft Cloth Surgical T ape, 4 x 10 (in/yd) Discharge Instruction: Secure with tape as directed. Compression Wrap Compression Stockings Add-Ons Electronic Signature(s) Signed: 05/08/2022 12:37:25 PM By: [name] Entered By: [name] on 05/08/2022 09:40:20 [name] Clines ([id number]) [id number] Page 10 of 10 -------------------------------------------------------------------------------- Vitals Details Patient Name: Date of Service: [name], [name]. 05/08/2022 9:15 [name] Medical Record Number: [id number] Patient Account Number: [id number] Date of Birth/Sex: Treating [name]: 1956-01-27 (66 y.o. M) Primary Care Sourish Allender: [name] Other Clinician: Referring Jed Kutch: Treating Earl Zellmer/Extender: [name] in [name]: 3 Vital Signs Time Taken: 09:25 Temperature (F): 97.6 Height (in): 72 Pulse (bpm): 84 Weight (lbs): 220 Respiratory Rate (breaths/min): 18 Body Mass Index (BMI): 29.8 Blood Pressure (mmHg): 137/76 Capillary Blood Glucose (mg/dl): 132 Reference Range: 80 - 120 mg  / dl Electronic Signature(s) Signed: 05/08/2022 12:37:25 PM By: [name] Entered By: [name] on 05/08/2022 09:27:52

## 2022-05-12 ENCOUNTER — Other Ambulatory Visit: Payer: Self-pay

## 2022-05-12 ENCOUNTER — Ambulatory Visit (INDEPENDENT_AMBULATORY_CARE_PROVIDER_SITE_OTHER): Payer: Medicare HMO | Admitting: Internal Medicine

## 2022-05-12 ENCOUNTER — Encounter: Payer: Self-pay | Admitting: Internal Medicine

## 2022-05-12 VITALS — BP 113/64 | HR 95 | Temp 97.8°F | Wt 218.0 lb

## 2022-05-12 DIAGNOSIS — E11621 Type 2 diabetes mellitus with foot ulcer: Secondary | ICD-10-CM | POA: Diagnosis not present

## 2022-05-12 DIAGNOSIS — N289 Disorder of kidney and ureter, unspecified: Secondary | ICD-10-CM

## 2022-05-12 DIAGNOSIS — L97509 Non-pressure chronic ulcer of other part of unspecified foot with unspecified severity: Secondary | ICD-10-CM

## 2022-05-12 DIAGNOSIS — Z23 Encounter for immunization: Secondary | ICD-10-CM | POA: Diagnosis not present

## 2022-05-12 DIAGNOSIS — Z5181 Encounter for therapeutic drug level monitoring: Secondary | ICD-10-CM

## 2022-05-12 DIAGNOSIS — M869 Osteomyelitis, unspecified: Secondary | ICD-10-CM

## 2022-05-12 DIAGNOSIS — E1169 Type 2 diabetes mellitus with other specified complication: Secondary | ICD-10-CM

## 2022-05-12 DIAGNOSIS — M8610 Other acute osteomyelitis, unspecified site: Secondary | ICD-10-CM | POA: Diagnosis not present

## 2022-05-12 LAB — CBC WITH DIFFERENTIAL/PLATELET
Absolute Monocytes: 662 cells/uL (ref 200–950)
Basophils Absolute: 31 cells/uL (ref 0–200)
Basophils Relative: 0.4 %
Eosinophils Absolute: 123 cells/uL (ref 15–500)
Eosinophils Relative: 1.6 %
HCT: 46 % (ref 38.5–50.0)
Hemoglobin: 14.9 g/dL (ref 13.2–17.1)
Lymphs Abs: 1894 cells/uL (ref 850–3900)
MCH: 26 pg — ABNORMAL LOW (ref 27.0–33.0)
MCHC: 32.4 g/dL (ref 32.0–36.0)
MCV: 80.1 fL (ref 80.0–100.0)
MPV: 9.6 fL (ref 7.5–12.5)
Monocytes Relative: 8.6 %
Neutro Abs: 4990 cells/uL (ref 1500–7800)
Neutrophils Relative %: 64.8 %
Platelets: 463 10*3/uL — ABNORMAL HIGH (ref 140–400)
RBC: 5.74 10*6/uL (ref 4.20–5.80)
RDW: 15.8 % — ABNORMAL HIGH (ref 11.0–15.0)
Total Lymphocyte: 24.6 %
WBC: 7.7 10*3/uL (ref 3.8–10.8)

## 2022-05-12 LAB — COMPREHENSIVE METABOLIC PANEL
AG Ratio: 1.4 (calc) (ref 1.0–2.5)
ALT: 17 U/L (ref 9–46)
AST: 12 U/L (ref 10–35)
Albumin: 4.3 g/dL (ref 3.6–5.1)
Alkaline phosphatase (APISO): 118 U/L (ref 35–144)
BUN/Creatinine Ratio: 10 (calc) (ref 6–22)
BUN: 14 mg/dL (ref 7–25)
CO2: 24 mmol/L (ref 20–32)
Calcium: 9.8 mg/dL (ref 8.6–10.3)
Chloride: 101 mmol/L (ref 98–110)
Creat: 1.4 mg/dL — ABNORMAL HIGH (ref 0.70–1.35)
Globulin: 3 g/dL (calc) (ref 1.9–3.7)
Glucose, Bld: 360 mg/dL — ABNORMAL HIGH (ref 65–99)
Potassium: 4.4 mmol/L (ref 3.5–5.3)
Sodium: 135 mmol/L (ref 135–146)
Total Bilirubin: 0.3 mg/dL (ref 0.2–1.2)
Total Protein: 7.3 g/dL (ref 6.1–8.1)

## 2022-05-12 LAB — SEDIMENTATION RATE: Sed Rate: 17 mm/h (ref 0–20)

## 2022-05-13 ENCOUNTER — Telehealth: Payer: Self-pay

## 2022-05-13 DIAGNOSIS — Z5181 Encounter for therapeutic drug level monitoring: Secondary | ICD-10-CM | POA: Insufficient documentation

## 2022-05-13 DIAGNOSIS — N289 Disorder of kidney and ureter, unspecified: Secondary | ICD-10-CM | POA: Insufficient documentation

## 2022-05-13 NOTE — Assessment & Plan Note (Signed)
Cbc and LFTs wnl.

## 2022-05-13 NOTE — Telephone Encounter (Signed)
-----   Message from Thayer Headings, MD sent at 05/13/2022 10:20 AM EST ----- His creat bumped up a little bit.  Can you see if he can come in this Friday to repeat it? thanks

## 2022-05-13 NOTE — Assessment & Plan Note (Signed)
His ulcer is healing well and no current bone exposure.   At this point, will have him continue with his current regiment

## 2022-05-13 NOTE — Progress Notes (Signed)
   Subjective:    Patient ID: [name], male    DOB: 1956-03-10, 66 y.o.   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   He had a bone biopsy positive for acute osteomyelitis but no culture sent so I started him on doxycycline and levaquin.  He is tolerating this well and his wound is healing well.  No concerns with rash or diarrhea.    Review of Systems  Constitutional:  Negative for fatigue.  Gastrointestinal:  Negative for diarrhea and nausea.  Skin:  Negative for rash.       Objective:   Physical Exam Musculoskeletal:     Comments: His wound is open, good granulation tissue, no pus, no surrounding erythema.  Tissue covering bone.   Neurological:     Mental Status: He is alert.    SH: no current tobacco       Assessment & Plan:

## 2022-05-13 NOTE — Assessment & Plan Note (Signed)
His creat is mildly elevated to 1.4.  I will have him return early next week to repeat it

## 2022-05-13 NOTE — Telephone Encounter (Signed)
Contacted patient on home number - no answer, voicemail box full. Called mobile number - no answer.  [place], CMA

## 2022-05-14 ENCOUNTER — Encounter (HOSPITAL_BASED_OUTPATIENT_CLINIC_OR_DEPARTMENT_OTHER): Payer: Medicare HMO | Admitting: Internal Medicine

## 2022-05-14 NOTE — Telephone Encounter (Signed)
2nd attempt contacting patient - unable to leave a message, vm box full.

## 2022-05-15 NOTE — Telephone Encounter (Signed)
Patient returned phone call and I relayed lab results. Patient stated he is not able to come to the office until Monday 05/18/22 to get labs drawn due to transportation.  [name]

## 2022-05-15 NOTE — Telephone Encounter (Signed)
Attempted to contact the patient. No answer and vm full

## 2022-05-18 ENCOUNTER — Other Ambulatory Visit: Payer: Self-pay

## 2022-05-18 ENCOUNTER — Encounter (HOSPITAL_BASED_OUTPATIENT_CLINIC_OR_DEPARTMENT_OTHER): Payer: Medicare HMO | Admitting: Internal Medicine

## 2022-05-18 ENCOUNTER — Other Ambulatory Visit: Payer: Medicare HMO

## 2022-05-18 DIAGNOSIS — L97522 Non-pressure chronic ulcer of other part of left foot with fat layer exposed: Secondary | ICD-10-CM

## 2022-05-18 DIAGNOSIS — M8610 Other acute osteomyelitis, unspecified site: Secondary | ICD-10-CM

## 2022-05-18 DIAGNOSIS — E11621 Type 2 diabetes mellitus with foot ulcer: Secondary | ICD-10-CM | POA: Diagnosis not present

## 2022-05-18 LAB — BASIC METABOLIC PANEL
BUN: 15 mg/dL (ref 7–25)
CO2: 24 mmol/L (ref 20–32)
Calcium: 9.6 mg/dL (ref 8.6–10.3)
Chloride: 103 mmol/L (ref 98–110)
Creat: 0.96 mg/dL (ref 0.70–1.35)
Glucose, Bld: 145 mg/dL — ABNORMAL HIGH (ref 65–99)
Potassium: 4.1 mmol/L (ref 3.5–5.3)
Sodium: 139 mmol/L (ref 135–146)

## 2022-05-18 NOTE — Progress Notes (Signed)
[name] [name], [name] [name] ([id number]) [id number] Page 1 of 5 Visit Report for 05/18/2022 Debridement Details Patient Name: Date of Service: [name] [name], [place] [name] [name]. 05/18/2022 10:15 [name] [name] Medical Record Number: [id number] Patient Account Number: [id number] Date of Birth/Sex: Treating RN: 1955-12-15 (66 y.o. [name], [name] Primary Care Provider: York Ram Other Clinician: Referring Provider: Treating Provider/Extender: Coralee North Weeks in Treatment: 4 Debridement Performed for Assessment: Wound #5 Left,Lateral Foot Performed By: Physician [name], DO Debridement Type: Debridement Severity of Tissue Pre Debridement: Fat layer exposed Level of Consciousness (Pre-procedure): Awake and Alert Pre-procedure Verification/Time Out Yes - 11:34 Taken: Start Time: 11:34 Pain Control: Lidocaine T Area Debrided (L x W): otal 6 (cm) x 1.5 (cm) = 9 (cm) Tissue and other material debrided: Viable, Non-Viable, Slough, Subcutaneous, Slough Level: Skin/Subcutaneous Tissue Debridement Description: Excisional Instrument: Curette Bleeding: Minimum Hemostasis Achieved: Pressure End Time: 11:34 Procedural Pain: 0 Post Procedural Pain: 0 Response to Treatment: Procedure was tolerated well Level of Consciousness (Post- Awake and Alert procedure): Post Debridement Measurements of Total Wound Length: (cm) 6 Width: (cm) 1.5 Depth: (cm) 0.7 Volume: (cm) 4.948 Character of Wound/Ulcer Post Debridement: Improved Severity of Tissue Post Debridement: Fat layer exposed Post Procedure Diagnosis Same as Pre-procedure Electronic Signature(s) Signed: 05/18/2022 1:13:56 PM By: [name] DO Signed: 05/18/2022 4:10:29 PM By: Rhae Hammock RN Entered By: Rhae Hammock on 05/18/2022 11:34:54 -------------------------------------------------------------------------------- Debridement Details Patient Name: Date of Service: [name] [name],  [name] [name] [name]. 05/18/2022 10:15 [name] [name] Medical Record Number: [id number] Patient Account Number: [id number] Date of Birth/Sex: Treating RN: 1956-06-17 (66 y.o. [name] [name] Primary Care Provider: York Ram Other Clinician: Referring Provider: Treating Provider/Extender: Coralee North Weeks in Treatment: 4 Debridement Performed for Assessment: Wound #4 Left,Dorsal Foot Performed By: Physician [name], DO [name] ([id number]) [id number] Page 2 of 5 Debridement Type: Debridement Severity of Tissue Pre Debridement: Fat layer exposed Level of Consciousness (Pre-procedure): Awake and Alert Pre-procedure Verification/Time Out Yes - 11:34 Taken: Start Time: 11:34 Pain Control: Lidocaine T Area Debrided (L x W): otal 2.2 (cm) x 1 (cm) = 2.2 (cm) Tissue and other material debrided: Viable, Non-Viable, Slough, Subcutaneous, Slough Level: Skin/Subcutaneous Tissue Debridement Description: Excisional Instrument: Curette Bleeding: Minimum Hemostasis Achieved: Pressure End Time: 11:34 Procedural Pain: 0 Post Procedural Pain: 0 Response to Treatment: Procedure was tolerated well Level of Consciousness (Post- Awake and Alert procedure): Post Debridement Measurements of Total Wound Length: (cm) 2.2 Width: (cm) 1 Depth: (cm) 0.3 Volume: (cm) 0.518 Character of Wound/Ulcer Post Debridement: Improved Severity of Tissue Post Debridement: Fat layer exposed Post Procedure Diagnosis Same as Pre-procedure Electronic Signature(s) Signed: 05/18/2022 1:13:56 PM By: [name] DO Signed: 05/18/2022 4:10:29 PM By: Rhae Hammock RN Entered By: Rhae Hammock on 05/18/2022 11:39:47 -------------------------------------------------------------------------------- Physician Orders Details Patient Name: Date of Service: [name] [name], [name] [name] [name]. 05/18/2022 10:15 [name] [name] Medical Record Number: [id number] Patient Account  Number: [id number] Date of Birth/Sex: Treating RN: [birth year] (66 y.o. [name] [name] Primary Care Provider: York Ram Other Clinician: Referring Provider: Treating Provider/Extender: Coralee North Weeks in Treatment: 4 Verbal / Phone Orders: No Diagnosis Coding Follow-up Appointments ppointment in 1 week. - w/ Dr. Heber Rockleigh Return [name] Other: - Patient to consider hyperbaric oxygen therapy. HBO orientation today 11/13 Anesthetic (In clinic) Topical Lidocaine 5% applied to wound bed Cellular or Tissue Based Products Cellular or Tissue Based Product Type: - Run IVR for Grafix for left dorsal foot- pending Bathing/ Shower/ Hygiene May  shower with protection but do not get wound dressing(s) wet. Edema Control - Lymphedema / SCD / Other Elevate legs to the level of the heart or above for 30 minutes daily and/or when sitting, [name] frequency of: - 3-4 times [name] day throughout the day. Avoid standing for long periods of time. Off-Loading [name] [name], [name] [name] ([id number]) [id number] Page 3 of 5 Open toe surgical shoe to: - wear when standing or walking. Home Health New wound care orders this week; continue Home Health for wound care. May utilize formulary equivalent dressing for wound treatment orders unless otherwise specified. - Continue medihoney and hydrofera blue to dorsal wound. Prisma with saline moisten gauzes backing. Dressing changes to be completed by [place] on Monday / Wednesday / Friday except when patient has scheduled visit at [facility] Other Home Health Orders/Instructions: - [facility]. Hyperbaric Oxygen Therapy Evaluate for HBO Therapy Indication: - Wagner Grade III to left lateral foot 2.5 ATA for 90 Minutes with 2 Five (5) Minute [name] Breaks ir Total Number of Treatments: - 40 One treatments per day (delivered Monday through Friday unless otherwise specified in Special Instructions below): Finger  stick Blood Glucose Pre- and Post- HBOT Treatment. Follow Hyperbaric Oxygen [name] Protocol Afrin (Oxymetazoline HCL) 0.05% nasal spray - 1 spray in both nostrils daily as needed prior to HBO treatment for difficulty clearing ears Wound Treatment Wound #4 - Foot Wound Laterality: Dorsal, Left Cleanser: Wound Cleanser (Home Health) 1 x Per Day/30 Days Discharge Instructions: Cleanse the wound with wound cleanser prior to applying [name] clean dressing using gauze sponges, not tissue or cotton balls. Peri-Wound Care: Skin Prep (Home Health) 1 x Per Day/30 Days Discharge Instructions: Use skin prep as directed Prim Dressing: Hydrofera Blue Ready Foam, 4x5 in (Home Health) 1 x Per Day/30 Days ary Discharge Instructions: Apply to wound bed as instructed Prim Dressing: MediHoney Gel, tube 1.5 (oz) ([place]) 1 x Per Day/30 Days ary Discharge Instructions: Apply to wound bed as instructed Secondary Dressing: ABD Pad, 8x10 ([place]) 1 x Per Day/30 Days Discharge Instructions: Apply over primary dressing as directed. Secured With: [employer], 4.5x3.1 (in/yd) (Home Health) 1 x Per Day/30 Days Discharge Instructions: Secure with Kerlix as directed. Secured With: 81M Medipore H Soft Cloth Surgical T ape, 4 x 10 (in/yd) (Home Health) 1 x Per Day/30 Days Discharge Instructions: Secure with tape as directed. Wound #5 - Foot Wound Laterality: Left, Lateral Cleanser: Wound Cleanser (Home Health) 1 x Per Day/30 Days Discharge Instructions: Cleanse the wound with wound cleanser prior to applying [name] clean dressing using gauze sponges, not tissue or cotton balls. Prim Dressing: Promogran Prisma Matrix, 4.34 (sq in) (silver collagen) (Home Health) 1 x Per Day/30 Days ary Discharge Instructions: Moisten collagen with saline or hydrogel Prim Dressing: saline moisten gauze 1 x Per Day/30 Days ary Discharge Instructions: back the prisma with saline moisten gauzes. Secondary Dressing: ABD Pad, 8x10  (Home Health) 1 x Per Day/30 Days Discharge Instructions: Apply over primary dressing as directed. Secured With: [employer], 4.5x3.1 (in/yd) (Home Health) 1 x Per Day/30 Days Discharge Instructions: Secure with Kerlix as directed. Secured With: 81M Medipore H Soft Cloth Surgical T ape, 4 x 10 (in/yd) (Home Health) 1 x Per Day/30 Days Discharge Instructions: Secure with tape as directed. [name] [name] [name] INTERVENTIONS ACTION INTERVENTION Obtain [name] capillary blood glucose (ensure 1 physician order is in chart). [name]. Notify HBO physician and await physician orders. 2 If result is 70  mg/dl or below: B. If the result meets the hospital definition of [name] critical result, follow hospital policy. [name]. Give patient an 8 ounce Glucerna Shake, an 8 ounce Ensure, or 8 ounces of [name] Glucerna/Ensure equivalent dietary supplement*. B. Wait 30 minutes. If result is 71 mg/dl to 130 mg/dl: C. Retest patients capillary blood glucose (CBG). D. If result greater than or equal to 110 mg/dl, KIEV, LABROSSE [name] ([id number]) [phone].pdf Page 4 of 5 proceed with HBO. If result less than 110 mg/dl, notify HBO physician and consider holding HBO. If result is 131 mg/dl to 249 mg/dl: [name]. Proceed with HBO. [name]. Notify HBO physician and await physician orders. B. It is recommended to hold HBO and do If result is 250 mg/dl or greater: blood/urine ketone testing. C. If the result meets the hospital definition of [name] critical result, follow hospital policy. POST-HBO [name] INTERVENTIONS ACTION INTERVENTION Obtain post HBO capillary blood glucose (ensure 1 physician order is in chart). [name]. Notify HBO physician and await physician orders. 2 If result is 70 mg/dl or below: B. If the result meets the hospital definition of [name] critical result, follow hospital policy. [name]. Give patient an 8 ounce Glucerna Shake, an 8 ounce Ensure, or 8 ounces of  [name] Glucerna/Ensure equivalent dietary supplement*. B. Wait 15 minutes for symptoms of If result is 71 mg/dl to 100 mg/dl: hypoglycemia (i.e. nervousness, anxiety, sweating, chills, clamminess, irritability, confusion, tachycardia or dizziness). C. If patient asymptomatic, discharge patient. If patient symptomatic, repeat capillary blood glucose (CBG) and notify HBO physician. If result is 101 mg/dl to 249 mg/dl: [name]. Discharge patient. [name]. Notify HBO physician and await physician orders. B. It is recommended to do blood/urine ketone If result is 250 mg/dl or greater: testing. C. If the result meets the hospital definition of [name] critical result, follow hospital policy. *Juice or candies are NOT equivalent products. If patient refuses the Glucerna or Ensure, please consult the hospital dietitian for an appropriate substitute. Electronic Signature(s) Signed: 05/18/2022 1:13:56 PM By: [name] DO Signed: 05/18/2022 4:10:29 PM By: Rhae Hammock RN Entered By: Rhae Hammock on 05/18/2022 11:38:13 -------------------------------------------------------------------------------- SuperBill Details Patient Name: Date of Service: [name] [name], [name] [name] [name]. 05/18/2022 Medical Record Number: [id number] Patient Account Number: [id number] Date of Birth/Sex: Treating RN: Mar 12, 1956 (66 y.o. [name], [name] Primary Care Provider: York Ram Other Clinician: Referring Provider: Treating Provider/Extender: Coralee North Weeks in Treatment: 4 Diagnosis Coding ICD-10 Codes Code Description [phone] 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 E11.621 Type 2 diabetes mellitus with foot ulcer E11.42 Type 2 diabetes mellitus with diabetic polyneuropathy Facility Procedures : CPT4 Code: [id number] Description: 14481 - DEB SUBQ TISSUE 20 SQ  CM/< ICD-10 Diagnosis Description L97.522 Non-pressure chronic ulcer of other part of left foot with fat layer exposed Modifier: Quantity: 1 Physician Procedures XUE, LOW [name] ([id number]): CPT4 Code Description [id number] 85027 - WC PHYS SUBQ TISS 20 SQ CM ICD-10 Diagnosis Description L97.522 Non-pressure chronic ulcer of other part of left foot with fat la [id number] Page 5 of 5: Quantity Modifier 1 yer exposed Electronic Signature(s) Signed: 05/18/2022 1:13:56 PM By: [name] DO Signed: 05/18/2022 4:10:29 PM By: Rhae Hammock RN Entered By: Rhae Hammock on 05/18/2022 11:40:10

## 2022-05-19 ENCOUNTER — Telehealth: Payer: Self-pay

## 2022-05-19 NOTE — Telephone Encounter (Signed)
-----   Message from Thayer Headings, MD sent at 05/19/2022  4:15 PM EST ----- Please let him know his repeat creat is back to normal so no changes to his treatment needed.  thanks

## 2022-05-19 NOTE — Telephone Encounter (Signed)
Called patient to relay results, no answer and voicemail box full.   Beryle Flock, RN

## 2022-05-20 NOTE — Progress Notes (Signed)
BIJAN, RIDGLEY A ([id number]) [id number] Page 1 of 10 Visit Report for 05/18/2022 Arrival Information Details Patient Name: Date of Service: [name], [name] 05/18/2022 10:15 A M Medical Record Number: [id number] Patient Account Number: [id number] Date of Birth/Sex: Treating RN: 1955/08/27 (66 y.o. [name] Primary Care Darlynn Ricco: York Ram Other Clinician: Referring Deandra Gadson: Treating Lailoni Baquera/Extender: Coralee North Weeks in Treatment: 4 Visit Information History Since Last Visit Added or deleted any medications: No Patient Arrived: Wheel Chair Any new allergies or adverse reactions: No Arrival Time: 11:01 Had a fall or experienced change in No Transfer Assistance: None activities of daily living that may affect Patient Identification Verified: Yes risk of falls: Secondary Verification Process Completed: Yes Signs or symptoms of abuse/neglect since last visito No Patient Requires Transmission-Based Precautions: No Hospitalized since last visit: No Patient Has Alerts: No Implantable device outside of the clinic excluding No cellular tissue based products placed in the center since last visit: Has Dressing in Place as Prescribed: Yes Pain Present Now: No Electronic Signature(s) Signed: 05/20/2022 8:21:58 AM By: Sharyn Creamer RN, [name] Entered By: Sharyn Creamer on 05/18/2022 11:06:45 -------------------------------------------------------------------------------- Encounter Discharge Information Details Patient Name: Date of Service: [name], [name] 05/18/2022 10:15 A M Medical Record Number: [id number] Patient Account Number: [id number] Date of Birth/Sex: Treating RN: May 04, 1956 (66 y.o. [name] Primary Care Mele Sylvester: York Ram Other Clinician: Referring Jaelin Devincentis: Treating Donnalynn Wheeless/Extender: Coralee North Weeks in Treatment: 4 Encounter Discharge Information  Items Post Procedure Vitals Discharge Condition: Stable Temperature (F): 98.7 Ambulatory Status: Wheelchair Pulse (bpm): 74 Discharge Destination: Home Respiratory Rate (breaths/min): 17 Transportation: Private Auto Blood Pressure (mmHg): 120/80 Accompanied By: family Schedule Follow-up Appointment: Yes Clinical Summary of Care: Patient Declined Electronic Signature(s) Signed: 05/18/2022 4:10:29 PM By: Rhae Hammock RN Entered By: Rhae Hammock on 05/18/2022 12:28:48 [name] ([id number]) [id number] Page 2 of 10 -------------------------------------------------------------------------------- Lower Extremity Assessment Details Patient Name: Date of Service: [name], [name] 05/18/2022 10:15 A M Medical Record Number: [id number] Patient Account Number: [id number] Date of Birth/Sex: Treating RN: October 04, 1955 (66 y.o. [name] Primary Care Lekeya Rollings: York Ram Other Clinician: Referring Khalea Ventura: Treating Javoni Lucken/Extender: Coralee North Weeks in Treatment: 4 Edema Assessment Assessed: [Left: No] [Right: No] Edema: [Left: Ye] [Right: s] Calf Left: Right: Point of Measurement: 36 cm From Medial Instep 38.5 cm Ankle Left: Right: Point of Measurement: 9 cm From Medial Instep 23 cm Vascular Assessment Pulses: Dorsalis Pedis Palpable: [Left:Yes] Electronic Signature(s) Signed: 05/20/2022 8:21:58 AM By: Sharyn Creamer RN, [name] Entered By: Sharyn Creamer on 05/18/2022 11:08:20 -------------------------------------------------------------------------------- Multi Wound Chart Details Patient Name: Date of Service: [name], [name] 05/18/2022 10:15 A M Medical Record Number: [id number] Patient Account Number: [id number] Date of Birth/Sex: Treating RN: 10-03-1955 (66 y.o. M) Primary Care Rashell Shambaugh: York Ram Other Clinician: Referring Cartez Mogle: Treating Jenae Tomasello/Extender: Coralee North Weeks in Treatment: 4 Vital Signs Height(in): 72 Capillary Blood Glucose(mg/dl): 169 Weight(lbs): 220 Pulse(bpm): 86 Body Mass Index(BMI): 29.8 Blood Pressure(mmHg): 101/68 Temperature(F): 97.8 Respiratory Rate(breaths/min): 18 [4:Photos:] [N/A:N/A [id number] Page 3 of 10] Left, Dorsal Foot Left, Lateral Foot N/A Wound Location: Gradually Appeared Surgical Injury N/A Wounding Event: Diabetic Wound/Ulcer of the Lower Diabetic Wound/Ulcer of the Lower N/A Primary Etiology: Extremity Extremity N/A Open Surgical Wound N/A Secondary Etiology: Asthma, Sleep Apnea, Hypertension, Asthma, Sleep Apnea, Hypertension, N/A Comorbid History: Peripheral Venous Disease, Type II Peripheral Venous Disease, Type II Diabetes, Osteomyelitis, Neuropathy Diabetes, Osteomyelitis,  Neuropathy 09/03/2021 12/20/2021 N/A Date Acquired: 4 4 N/A Weeks of Treatment: Open Open N/A Wound Status: No No N/A Wound Recurrence: 2.2x1x0.3 6x1.5x0.7 N/A Measurements L x W x D (cm) 1.728 7.069 N/A A (cm) : rea 0.518 4.948 N/A Volume (cm) : 26.70% 29.30% N/A % Reduction in A rea: -10.00% 1.00% N/A % Reduction in Volume: Grade 3 Grade 3 N/A Classification: Medium Medium N/A Exudate A mount: Serosanguineous Serosanguineous N/A Exudate Type: red, brown red, brown N/A Exudate Color: Distinct, outline attached Epibole N/A Wound Margin: Large (67-100%) Large (67-100%) N/A Granulation A mount: Pink, Pale Red, Pink N/A Granulation Quality: Small (1-33%) Small (1-33%) N/A Necrotic A mount: Fat Layer (Subcutaneous Tissue): Yes Fat Layer (Subcutaneous Tissue): Yes N/A Exposed Structures: Fascia: No Bone: Yes Tendon: No Fascia: No Muscle: No Tendon: No Joint: No Muscle: No Bone: No Joint: No Small (1-33%) Small (1-33%) N/A Epithelialization: Debridement - Excisional Debridement - Excisional N/A Debridement: Pre-procedure Verification/Time Out  11:34 11:34 N/A Taken: Lidocaine Lidocaine N/A Pain Control: Subcutaneous, Slough Subcutaneous, Slough N/A Tissue Debrided: Skin/Subcutaneous Tissue Skin/Subcutaneous Tissue N/A Level: 2.2 9 N/A Debridement A (sq cm): rea Curette Curette N/A Instrument: Minimum Minimum N/A Bleeding: Pressure Pressure N/A Hemostasis A chieved: 0 0 N/A Procedural Pain: 0 0 N/A Post Procedural Pain: Procedure was tolerated well Procedure was tolerated well N/A Debridement Treatment Response: 2.2x1x0.3 6x1.5x0.7 N/A Post Debridement Measurements L x W x D (cm) 0.518 4.948 N/A Post Debridement Volume: (cm) Excoriation: No Callus: Yes N/A Periwound Skin Texture: Induration: No Excoriation: No Callus: No Induration: No Crepitus: No Crepitus: No Rash: No Rash: No Scarring: No Scarring: No Maceration: No Maceration: No N/A Periwound Skin Moisture: Dry/Scaly: No Dry/Scaly: No Atrophie Blanche: No Atrophie Blanche: No N/A Periwound Skin Color: Cyanosis: No Cyanosis: No Ecchymosis: No Ecchymosis: No Erythema: No Erythema: No Hemosiderin Staining: No Hemosiderin Staining: No Mottled: No Mottled: No Pallor: No Pallor: No Rubor: No Rubor: No No Abnormality No Abnormality N/A Temperature: Debridement Debridement N/A Procedures Performed: Treatment Notes Wound #4 (Foot) Wound Laterality: Dorsal, Left Cleanser Wound Cleanser Discharge Instruction: Cleanse the wound with wound cleanser prior to applying a clean dressing using gauze sponges, not tissue or cotton balls. Peri-Wound Care Skin Prep Discharge Instruction: Use skin prep as directed Topical Primary Dressing Hydrofera Blue Ready Foam, 4x5 in Discharge Instruction: Apply to wound bed as instructed RAWSON, MINIX A ([id number]) [id number].pdf Page 4 of 10 MediHoney Gel, tube 1.5 (oz) Discharge Instruction: Apply to wound bed as instructed Secondary Dressing ABD Pad, 8x10 Discharge  Instruction: Apply over primary dressing as directed. Secured With [employer], 4.5x3.1 (in/yd) Discharge Instruction: Secure with Kerlix as directed. 76M Medipore H Soft Cloth Surgical T ape, 4 x 10 (in/yd) Discharge Instruction: Secure with tape as directed. Compression Wrap Compression Stockings Add-Ons Wound #5 (Foot) Wound Laterality: Left, Lateral Cleanser Wound Cleanser Discharge Instruction: Cleanse the wound with wound cleanser prior to applying a clean dressing using gauze sponges, not tissue or cotton balls. Peri-Wound Care Topical Primary Dressing Promogran Prisma Matrix, 4.34 (sq in) (silver collagen) Discharge Instruction: Moisten collagen with saline or hydrogel saline moisten gauze Discharge Instruction: back the prisma with saline moisten gauzes. Secondary Dressing ABD Pad, 8x10 Discharge Instruction: Apply over primary dressing as directed. Secured With [employer], 4.5x3.1 (in/yd) Discharge Instruction: Secure with Kerlix as directed. 76M Medipore H Soft Cloth Surgical T ape, 4 x 10 (in/yd) Discharge Instruction: Secure with tape as directed. Compression Wrap Compression Stockings Add-Ons Electronic Signature(s) Signed: 05/19/2022 6:47:49 PM  By: [name] DO Entered By: [name] on 05/19/2022 18:14:29 -------------------------------------------------------------------------------- Multi-Disciplinary Care Plan Details Patient Name: Date of Service: [name], [name] 05/18/2022 10:15 A M Medical Record Number: [id number] Patient Account Number: [id number] Date of Birth/Sex: Treating RN: 06-May-1956 (66 y.o. [name] Primary Care Sanvika Cuttino: York Ram Other Clinician: Referring Wylodean Shimmel: Treating Abimbola Aki/Extender: Coralee North Weeks in Treatment: 4 TADHG, ESKEW A ([id number]) [id number] Page 5 of 10 Active Inactive Osteomyelitis Nursing  Diagnoses: Infection: osteomyelitis Goals: Diagnostic evaluation for osteomyelitis completed as ordered Date Initiated: 04/23/2022 Target Resolution Date: 07/03/2022 Goal Status: Active Signs and symptoms for osteomyelitis will be recognized and promptly addressed Date Initiated: 04/23/2022 Target Resolution Date: 06/25/2022 Goal Status: Active Interventions: Assess for signs and symptoms of osteomyelitis resolution every visit Provide education on osteomyelitis Screen for HBO Treatment Activities: Biopsy : 04/16/2022 Consult for HBO : 04/23/2022 Surgical debridement : 04/23/2022 Systemic antibiotics : 04/23/2022 T ordered outside of clinic : 04/23/2022 est Notes: Wound/Skin Impairment Nursing Diagnoses: Knowledge deficit related to ulceration/compromised skin integrity Goals: Patient/caregiver will verbalize understanding of skin care regimen Date Initiated: 04/14/2022 Target Resolution Date: 06/24/2022 Goal Status: Active Interventions: Assess patient/caregiver ability to perform ulcer/skin care regimen upon admission and as needed Assess ulceration(s) every visit Provide education on ulcer and skin care Notes: Electronic Signature(s) Signed: 05/18/2022 4:10:29 PM By: Rhae Hammock RN Entered By: Rhae Hammock on 05/18/2022 11:32:43 -------------------------------------------------------------------------------- Pain Assessment Details Patient Name: Date of Service: [name], [name] 05/18/2022 10:15 A M Medical Record Number: [id number] Patient Account Number: [id number] Date of Birth/Sex: Treating RN: 03/25/1956 (66 y.o. [name] Primary Care Lizmarie Witters: York Ram Other Clinician: Referring Jady Braggs: Treating Iktan Aikman/Extender: Coralee North Weeks in Treatment: 4 Active Problems Location of Pain Severity and Description of Pain Patient Has Paino No Site Locations PURCELL, JUNGBLUTH A ([id number])  [id number] Page 6 of 10 Pain Management and Medication Current Pain Management: Electronic Signature(s) Signed: 05/20/2022 8:21:58 AM By: Sharyn Creamer RN, [name] Entered By: Sharyn Creamer on 05/18/2022 11:07:42 -------------------------------------------------------------------------------- Patient/Caregiver Education Details Patient Name: Date of Service: [name], [name] 11/13/2023andnbsp10:15 A M Medical Record Number: [id number] Patient Account Number: [id number] Date of Birth/Gender: Treating RN: 1955/09/27 (66 y.o. [name] Primary Care Physician: York Ram Other Clinician: Referring Physician: Treating Physician/Extender: Dimas Aguas in Treatment: 4 Education Assessment Education Provided To: Patient Education Topics Provided Infection: Methods: Explain/Verbal Responses: State content correctly Electronic Signature(s) Signed: 05/18/2022 4:10:29 PM By: Rhae Hammock RN Entered By: Rhae Hammock on 05/18/2022 11:33:00 -------------------------------------------------------------------------------- Wound Assessment Details Patient Name: Date of Service: [name], [name] 05/18/2022 10:15 A M Medical Record Number: [id number] Patient Account Number: [id number] Date of Birth/Sex: Treating RN: Feb 24, 1956 (66 y.o. [name] Primary Care Elicia Lui: York Ram Other Clinician: Referring Jonee Lamore: Treating Aviyah Swetz/Extender: Stephon, Weathers A ([id number]) [id number].pdf Page 7 of 10 Weeks in Treatment: 4 Wound Status Wound Number: 4 Primary Diabetic Wound/Ulcer of the Lower Extremity Etiology: Wound Location: Left, Dorsal Foot Wound Open Wounding Event: Gradually Appeared Status: Date Acquired: 09/03/2021 Comorbid Asthma, Sleep Apnea, Hypertension, Peripheral Venous Disease, Weeks Of Treatment: 4 History: Type II  Diabetes, Osteomyelitis, Neuropathy Clustered Wound: No Photos Wound Measurements Length: (cm) 2.2 Width: (cm) 1 Depth: (cm) 0.3 Area: (cm) 1.728 Volume: (cm) 0.518 % Reduction in Area: 26.7% % Reduction in Volume: -10% Epithelialization: Small (1-33%) Tunneling: No Undermining: No Wound Description Classification: Grade 3 Wound Margin: Distinct, outline  attached Exudate Amount: Medium Exudate Type: Serosanguineous Exudate Color: red, brown Foul Odor After Cleansing: No Slough/Fibrino Yes Wound Bed Granulation Amount: Large (67-100%) Exposed Structure Granulation Quality: Pink, Pale Fascia Exposed: No Necrotic Amount: Small (1-33%) Fat Layer (Subcutaneous Tissue) Exposed: Yes Necrotic Quality: Adherent Slough Tendon Exposed: No Muscle Exposed: No Joint Exposed: No Bone Exposed: No Periwound Skin Texture Texture Color No Abnormalities Noted: No No Abnormalities Noted: Yes Callus: No Temperature / Pain Crepitus: No Temperature: No Abnormality Excoriation: No Induration: No Rash: No Scarring: No Moisture No Abnormalities Noted: Yes Treatment Notes Wound #4 (Foot) Wound Laterality: Dorsal, Left Cleanser Wound Cleanser Discharge Instruction: Cleanse the wound with wound cleanser prior to applying a clean dressing using gauze sponges, not tissue or cotton balls. Peri-Wound Care Skin Prep Discharge Instruction: Use skin prep as directed Topical GOODWIN, KAMPHAUS A ([id number]) [phone].pdf Page 8 of 10 Primary Dressing Hydrofera Blue Ready Foam, 4x5 in Discharge Instruction: Apply to wound bed as instructed MediHoney Gel, tube 1.5 (oz) Discharge Instruction: Apply to wound bed as instructed Secondary Dressing ABD Pad, 8x10 Discharge Instruction: Apply over primary dressing as directed. Secured With [employer], 4.5x3.1 (in/yd) Discharge Instruction: Secure with Kerlix as directed. 66M Medipore H Soft Cloth Surgical T ape, 4 x  10 (in/yd) Discharge Instruction: Secure with tape as directed. Compression Wrap Compression Stockings Add-Ons Electronic Signature(s) Signed: 05/20/2022 8:21:58 AM By: Sharyn Creamer RN, [name] Entered By: Sharyn Creamer on 05/18/2022 11:13:33 -------------------------------------------------------------------------------- Wound Assessment Details Patient Name: Date of Service: [name], [name] 05/18/2022 10:15 A M Medical Record Number: [id number] Patient Account Number: [id number] Date of Birth/Sex: Treating RN: 07/13/1955 (66 y.o. [name] Primary Care Burt Piatek: York Ram Other Clinician: Referring Salima Rumer: Treating Triva Hueber/Extender: Coralee North Weeks in Treatment: 4 Wound Status Wound Number: 5 Primary Diabetic Wound/Ulcer of the Lower Extremity Etiology: Wound Location: Left, Lateral Foot Secondary Open Surgical Wound Wounding Event: Surgical Injury Etiology: Date Acquired: 12/20/2021 Wound Open Weeks Of Treatment: 4 Status: Clustered Wound: No Comorbid Asthma, Sleep Apnea, Hypertension, Peripheral Venous Disease, History: Type II Diabetes, Osteomyelitis, Neuropathy Photos Wound Measurements Length: (cm) 6 Width: (cm) 1.5 Depth: (cm) 0.7 Area: (cm) 7.069 Volume: (cm) 4.948 GEORG, ANG A ([id number]) Wound Description Classification: Grade 3 Wound Margin: Epibole Exudate Amount: Medium Exudate Type: Serosanguineous Exudate Color: red, brown Foul Odor After Cleansing: No Slough/Fibrino Yes % Reduction in Area: 29.3% % Reduction in Volume: 1% Epithelialization: Small (1-33%) Tunneling: No Undermining: No [id number].pdf Page 9 of 10 Wound Bed Granulation Amount: Large (67-100%) Exposed Structure Granulation Quality: Red, Pink Fascia Exposed: No Necrotic Amount: Small (1-33%) Fat Layer (Subcutaneous Tissue) Exposed: Yes Necrotic Quality: Adherent Slough Tendon Exposed: No Muscle  Exposed: No Joint Exposed: No Bone Exposed: Yes Periwound Skin Texture Texture Color No Abnormalities Noted: No No Abnormalities Noted: Yes Callus: Yes Temperature / Pain Crepitus: No Temperature: No Abnormality Excoriation: No Induration: No Rash: No Scarring: No Moisture No Abnormalities Noted: No Dry / Scaly: No Maceration: No Treatment Notes Wound #5 (Foot) Wound Laterality: Left, Lateral Cleanser Wound Cleanser Discharge Instruction: Cleanse the wound with wound cleanser prior to applying a clean dressing using gauze sponges, not tissue or cotton balls. Peri-Wound Care Topical Primary Dressing Promogran Prisma Matrix, 4.34 (sq in) (silver collagen) Discharge Instruction: Moisten collagen with saline or hydrogel saline moisten gauze Discharge Instruction: back the prisma with saline moisten gauzes. Secondary Dressing ABD Pad, 8x10 Discharge Instruction: Apply over primary dressing as directed. Secured With [employer], 4.5x3.1 (in/yd)  Discharge Instruction: Secure with Kerlix as directed. 74M Medipore H Soft Cloth Surgical T ape, 4 x 10 (in/yd) Discharge Instruction: Secure with tape as directed. Compression Wrap Compression Stockings Add-Ons Electronic Signature(s) Signed: 05/20/2022 8:21:58 AM By: Sharyn Creamer RN, [name] Entered By: Sharyn Creamer on 05/18/2022 11:14:30 [name] ([id number]) [phone].pdf Page 10 of 10 -------------------------------------------------------------------------------- Vitals Details Patient Name: Date of Service: [name], [name] 05/18/2022 10:15 A M Medical Record Number: [id number] Patient Account Number: [id number] Date of Birth/Sex: Treating RN: August 30, 1955 (66 y.o. [name] Primary Care Nainoa Woldt: York Ram Other Clinician: Referring Darryon Bastin: Treating Asra Gambrel/Extender: Coralee North Weeks in Treatment: 4 Vital Signs Time Taken:  11:06 Temperature (F): 97.8 Height (in): 72 Pulse (bpm): 86 Weight (lbs): 220 Respiratory Rate (breaths/min): 18 Body Mass Index (BMI): 29.8 Blood Pressure (mmHg): 101/68 Capillary Blood Glucose (mg/dl): 169 Reference Range: 80 - 120 mg / dl Electronic Signature(s) Signed: 05/20/2022 8:21:58 AM By: Sharyn Creamer RN, [name] Entered By: Sharyn Creamer on 05/18/2022 11:07:34

## 2022-05-20 NOTE — Telephone Encounter (Signed)
Patient informed of lab results and verbalized understanding.  [name] [name]  

## 2022-05-21 ENCOUNTER — Ambulatory Visit (HOSPITAL_BASED_OUTPATIENT_CLINIC_OR_DEPARTMENT_OTHER): Payer: Medicare HMO | Admitting: Internal Medicine

## 2022-05-25 ENCOUNTER — Encounter (HOSPITAL_BASED_OUTPATIENT_CLINIC_OR_DEPARTMENT_OTHER): Payer: Medicare HMO | Admitting: Internal Medicine

## 2022-05-26 ENCOUNTER — Encounter: Payer: Self-pay | Admitting: Internal Medicine

## 2022-05-26 ENCOUNTER — Other Ambulatory Visit: Payer: Self-pay

## 2022-05-26 ENCOUNTER — Ambulatory Visit: Payer: Medicare HMO | Admitting: Internal Medicine

## 2022-05-26 VITALS — BP 128/79 | HR 76 | Resp 16 | Ht 72.0 in | Wt 218.0 lb

## 2022-05-26 DIAGNOSIS — E1165 Type 2 diabetes mellitus with hyperglycemia: Secondary | ICD-10-CM

## 2022-05-26 DIAGNOSIS — E11621 Type 2 diabetes mellitus with foot ulcer: Secondary | ICD-10-CM | POA: Diagnosis not present

## 2022-05-26 DIAGNOSIS — E1169 Type 2 diabetes mellitus with other specified complication: Secondary | ICD-10-CM

## 2022-05-26 DIAGNOSIS — Z5181 Encounter for therapeutic drug level monitoring: Secondary | ICD-10-CM | POA: Diagnosis not present

## 2022-05-26 DIAGNOSIS — L97509 Non-pressure chronic ulcer of other part of unspecified foot with unspecified severity: Secondary | ICD-10-CM

## 2022-05-26 DIAGNOSIS — Z794 Long term (current) use of insulin: Secondary | ICD-10-CM

## 2022-05-26 DIAGNOSIS — M869 Osteomyelitis, unspecified: Secondary | ICD-10-CM

## 2022-05-26 NOTE — Patient Instructions (Signed)
Thanks for coming today! Your foot looks great.  No signs of active infection now.  You can stop the antibiotics. Take Care!

## 2022-05-26 NOTE — Progress Notes (Signed)
   Subjective:    Patient ID: [name], male    DOB: 09-Nov-1955, 66 y.o.   MRN: [id number]  HPI [name] is here for continued follow up of acute osteomyelitis at his 5th metatarsal head.  He had a bone biopsy positive for acute osteomyelitis but no culture sent so I started him on doxycycline and levaquin.  He has tolerated this well and has been on is for 4 weeks.  His metatarsal area is healed.  No swelling or erythema.  ESR wnl last visit.     Review of Systems  Constitutional:  Negative for fatigue.  Gastrointestinal:  Negative for diarrhea and nausea.  Skin:  Negative for rash.       Objective:   Physical Exam Eyes:     General: No scleral icterus. Musculoskeletal:     Comments: His wound is open, no pus, no erythema.  5th MTP closed, no erythema, no swelling.   Neurological:     Mental Status: He is alert.    SH: no current tobacco       Assessment & Plan:

## 2022-05-26 NOTE — Assessment & Plan Note (Signed)
Seems to be healing nicely and no new concerns.  At this point, he has completed a prolonged treatment course with broad coverage and so will have him stop and observe off of antibiotics.   He will follow up as needed

## 2022-05-26 NOTE — Assessment & Plan Note (Signed)
Discussed the need for continued good blood sugar control

## 2022-05-26 NOTE — Assessment & Plan Note (Signed)
His follow up creat last week was wnl so resolved.  Now stopping antibiotics

## 2022-05-29 ENCOUNTER — Encounter (HOSPITAL_BASED_OUTPATIENT_CLINIC_OR_DEPARTMENT_OTHER): Payer: Medicare HMO | Admitting: Internal Medicine

## 2022-06-01 ENCOUNTER — Encounter (HOSPITAL_BASED_OUTPATIENT_CLINIC_OR_DEPARTMENT_OTHER): Payer: Medicare HMO | Admitting: Internal Medicine

## 2022-06-01 DIAGNOSIS — E1142 Type 2 diabetes mellitus with diabetic polyneuropathy: Secondary | ICD-10-CM

## 2022-06-01 DIAGNOSIS — L97522 Non-pressure chronic ulcer of other part of left foot with fat layer exposed: Secondary | ICD-10-CM | POA: Diagnosis not present

## 2022-06-01 DIAGNOSIS — L97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 specified severity: Secondary | ICD-10-CM

## 2022-06-01 DIAGNOSIS — E11621 Type 2 diabetes mellitus with foot ulcer: Secondary | ICD-10-CM | POA: Diagnosis not present

## 2022-06-01 LAB — GLUCOSE, CAPILLARY
Glucose-Capillary: 340 mg/dL — ABNORMAL HIGH (ref 70–99)
Glucose-Capillary: 187 mg/dL — ABNORMAL HIGH (ref 70–99)

## 2022-06-01 NOTE — Progress Notes (Addendum)
[name], [name] [name] ([id number]) [id number] Page 1 of 3 Visit Report for 06/01/2022 HBO Details Patient Name: Date of Service: [name], [name]. 06/01/2022 10:00 [name] Medical Record Number: [id number] Patient Account Number: [id number] Date of Birth/Sex: Treating RN: 08-Nov-1955 (66 y.o. [name] Primary Care [name]: [name], DA RREN Other Clinician: [name] Referring [name]: Treating [name]/Extender: [name] BA TES, DA RREN Weeks in Treatment: 6 HBO Treatment Course Details Treatment Course Number: 1 Ordering [name] T Treatments Ordered: otal 40 HBO Treatment Start Date: 06/01/2022 HBO Indication: Diabetic Ulcer(s) of the Lower Extremity Standard/Conservative Wound Care tried and failed greater than or equal to 30 days Wound #4 Left, Dorsal Foot , Wound #5 Left, Lateral Foot HBO Treatment Details Treatment Number: 1 Patient Type: Outpatient Chamber Type: Monoplace Chamber Serial #: [id number] Treatment Protocol: 2.5 ATA with 90 minutes oxygen, with two 5 minute air breaks Treatment Details Compression Rate Down: 1.0 psi / minute De-Compression Rate Up: 1.0 psi / minute [name] breaks and breathing ir Compress Tx Pressure periods Decompress Decompress Begins Reached (leave unused spaces Begins Ends blank) Chamber Pressure (ATA 1 2.5 2.5 2.5 2.5 2.5 - - 2.5 1 ) Clock Time (24 hr) 10:57 11:25 11:55 12:00 12:30 12:35 - - 13:05 13:26 Treatment Length: 149 (minutes) Treatment Segments: 5 Vital Signs Capillary Blood Glucose Reference Range: 80 - 120 mg / dl HBO Diabetic Blood Glucose Intervention Range: <131 mg/dl or >249 mg/dl Type: Time Vitals Blood Pulse: Respiratory Capillary Blood Glucose Pulse Action Temperature: Taken: Pressure: Rate: Glucose (mg/dl): Meter #: Oximetry (%) Taken: Pre 09:48 105/65 87 16 97.6 340 1 informed physician of CBG Post 13:28 176/96 73 16 97.9 187 1 none per  protocol Treatment Response Treatment Toleration: Well Treatment Completion Status: Treatment Completed without Adverse Event Treatment Notes Safely placed patient into hyperbaric chamber after checking vitals and performing safety checklist. Patient stated that he ate breakfast: cereal, steak/egg/cheese biscuit, orange juice, and jelly. Vital signs were within established protocols except for blood glucose (CBG:'340mg'$ /dL). Informed physician of blood glucose level. Patient cleared for treatment with discussion about controlling blood glucose levels for healing. Patient travelled at rate set of 1 psi/min and chamber purge rate on highest setting causing travel rate of effectively 0.7 psi/min until reaching 14.7psig. (2ATA). At 2 ATA, I increased travel rate to be 1 psi/min. Patient tolerated travel well. Patient tolerated treatment well. During decompression phase of the treatment rate set was 1 psi/min. Patient states that his ears feel fine and no other issues post treatment. Additional Procedure Documentation Tissue Sevierity: Necrosis of bone [name] Notes Evaluated patient prior to first dive. Lungs clear to auscultation, cardiovascular: regular rate and rhythm with no gallops or rubs. Tympanic membrane clear to the ears bilaterally. Blood glucose was elevated and I recommend he follow-up with his primary care physician for better glycemic control. He expressed understanding. Overall he felt well today. Physician HBO Attestation: I certify that I supervised this HBO treatment in accordance with Medicare [name], [name] ([id number]) [id number] Page 2 of 3 guidelines. [name] trained emergency response team is readily available per Yes hospital policies and procedures. Continue HBOT as ordered. Yes Electronic Signature(s) Unsigned Previous Signature: 06/01/2022 2:02:12 PM Version By: [name] CHT EMT BS , , Previous Signature: 06/01/2022 4:00:28 PM Version  By: [name] Shan DO Previous Signature: 06/01/2022 1:18:00 PM Version By: [name] CHT EMT BS , , Previous Signature: 06/01/2022 1:22:55 PM Version By: [name] Shan DO Entered By: [name] -------------------------------------------------------------------------------- HBO Safety Checklist Details Patient Name: Date of Service: [name], [name]. 06/01/2022 10:00 [name] Medical Record Number: [id number] Patient Account Number: [id number] Date of Birth/Sex: Treating RN: 1956-05-13 (67 y.o. [name], [name] Primary Care [name]: [name], DA RREN Other Clinician: [name] Referring [name]: Treating [name]/Extender: [name] BA TES, DA RREN Weeks in Treatment: 6 HBO Safety Checklist Items Safety Checklist Consent Form Signed Patient voided / foley secured and emptied When did you last eato 0800 Last dose of injectable or oral agent Novolog 70/30 Ostomy pouch emptied and vented if applicable NA All implantable devices assessed, documented and approved NA Intravenous access site secured and place NA Valuables secured Linens and cotton and cotton/polyester blend (less than 51% polyester) Personal oil-based products / skin lotions / body lotions removed Wigs or hairpieces removed NA Smoking or tobacco materials removed NA Books / newspapers / magazines / loose paper removed Cologne, aftershave, perfume and deodorant removed Jewelry removed (may wrap wedding band) Make-up removed NA Hair care products removed Battery operated devices (external) removed Heating patches and chemical warmers removed Titanium eyewear removed Nail polish cured greater than 10 hours NA Casting material cured greater than 10 hours NA Hearing aids removed NA Loose dentures or partials removed Prosthetics have been removed NA Patient demonstrates correct use of air break device (if applicable) Patient concerns have been  addressed Patient grounding bracelet on and cord attached to chamber Specifics for Inpatients (complete in addition to above) Medication sheet sent with patient NA Intravenous medications needed or due during therapy sent with patient NA Drainage tubes (e.g. nasogastric tube or chest tube secured and vented) NA Endotracheal or Tracheotomy tube secured NA Cuff deflated of air and inflated with saline NA Airway suctioned NA Notes [name], [name] ([id number]) [id number] Page 3 of 3 Paper version used prior to treatment. Electronic Signature(s) Signed: 06/01/2022 12:05:03 PM By: [name] CHT EMT BS , , Entered By: [name] on 06/01/2022 12:05:02

## 2022-06-01 NOTE — Progress Notes (Addendum)
[name], [name] ([id number]) [id number] Page 1 of 2 Visit Report for 06/01/2022 Arrival Information Details Patient Name: Date of Service: [name], [name] 06/01/2022 10:00 A M Medical Record Number: [id number] Patient Account Number: [id number] Date of Birth/Sex: Treating RN: [birth year] (66 y.o. [name], [name] Primary Care Mackensie Pilson: Eino Farber, DA RREN Other Clinician: [name] Referring Cate Oravec: Treating Norena Bratton/Extender: [name] BA TES, DA RREN Weeks in Treatment: 6 Visit Information History Since Last Visit All ordered tests and consults were completed: Yes Patient Arrived: Wheel Chair Added or deleted any medications: No Arrival Time: 08:45 Any new allergies or adverse reactions: No Accompanied By: self Had a fall or experienced change in No Transfer Assistance: None activities of daily living that may affect Patient Identification Verified: Yes risk of falls: Secondary Verification Process Completed: Yes Signs or symptoms of abuse/neglect since last visito No Patient Requires Transmission-Based Precautions: No Hospitalized since last visit: No Patient Has Alerts: No Implantable device outside of the clinic excluding No cellular tissue based products placed in the center since last visit: Pain Present Now: No Electronic Signature(s) Signed: 06/01/2022 12:02:45 PM By: [name] CHT EMT BS , , Entered By: [name] on 06/01/2022 12:02:45 -------------------------------------------------------------------------------- Encounter Discharge Information Details Patient Name: Date of Service: [name], [place] [name] 06/01/2022 10:00 A M Medical Record Number: [id number] Patient Account Number: [id number] Date of Birth/Sex: Treating RN: June 07, 1956 (66 y.o. [name] Primary Care Sadie Pickar: Eino Farber, DA RREN Other Clinician: [name] Referring Blessing Ozga: Treating Fredericka Bottcher/Extender: [name] BA TES, DA RREN Weeks in Treatment: 6 Encounter Discharge Information Items Discharge Condition: Stable Ambulatory Status: Wheelchair Discharge Destination: Home Transportation: Private Auto Accompanied By: self Schedule Follow-up Appointment: No Clinical Summary of Care: Electronic Signature(s) Signed: 06/01/2022 3:56:33 PM By: [name] CHT EMT BS , , Entered By: [name] on 06/01/2022 15:56:33 [name] ([id number]) [id number] Page 2 of 2 -------------------------------------------------------------------------------- Vitals Details Patient Name: Date of Service: [name], [name] 06/01/2022 10:00 A M Medical Record Number: [id number] Patient Account Number: [id number] Date of Birth/Sex: Treating RN: 1956/02/11 (66 y.o. [name] Primary Care Vicci Reder: Eino Farber, DA RREN Other Clinician: [name] Referring Yassmin Binegar: Treating Heloise Gordan/Extender: [name] BA TES, DA RREN Weeks in Treatment: 6 Vital Signs Time Taken: 09:48 Temperature (F): 97.6 Height (in): 72 Pulse (bpm): 87 Weight (lbs): 220 Respiratory Rate (breaths/min): 16 Body Mass Index (BMI): 29.8 Blood Pressure (mmHg): 105/65 Capillary Blood Glucose (mg/dl): 340 Reference Range: 80 - 120 mg / dl Electronic Signature(s) Signed: 06/01/2022 12:03:35 PM By: [name] CHT EMT BS , , Entered By: [name] on 06/01/2022 12:03:35

## 2022-06-01 NOTE — Progress Notes (Signed)
FLORENTINO, LAABS A ([id number]) [id number] Page 1 of 1 Visit Report for 06/01/2022 SuperBill Details Patient Name: Date of Service: [name], [name] 06/01/2022 Medical Record Number: [id number] Patient Account Number: [id number] Date of Birth/Sex: Treating RN: April 05, 1956 (66 y.o. 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 Facility Procedures CPT4 Code Description Modifier Quantity [id number] G0277-(Facility Use Only) HBOT full body chamber, 32mn , 5 ICD-10 Diagnosis Description E11.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.42 Type 2 diabetes mellitus with diabetic polyneuropathy Physician Procedures Quantity CPT4 Code Description Modifier [id number] 40973- WC PHYS HYPERBARIC OXYGEN THERAPY 1 ICD-10 Diagnosis Description E11.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

## 2022-06-02 ENCOUNTER — Encounter (HOSPITAL_BASED_OUTPATIENT_CLINIC_OR_DEPARTMENT_OTHER): Payer: Medicare HMO | Admitting: Internal Medicine

## 2022-06-02 DIAGNOSIS — L97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 specified severity: Secondary | ICD-10-CM | POA: Diagnosis not present

## 2022-06-02 DIAGNOSIS — E11621 Type 2 diabetes mellitus with foot ulcer: Secondary | ICD-10-CM | POA: Diagnosis not present

## 2022-06-02 DIAGNOSIS — L97522 Non-pressure chronic ulcer of other part of left foot with fat layer exposed: Secondary | ICD-10-CM

## 2022-06-02 DIAGNOSIS — E1142 Type 2 diabetes mellitus with diabetic polyneuropathy: Secondary | ICD-10-CM | POA: Diagnosis not present

## 2022-06-02 LAB — GLUCOSE, CAPILLARY
Glucose-Capillary: 141 mg/dL — ABNORMAL HIGH (ref 70–99)
Glucose-Capillary: 86 mg/dL (ref 70–99)
Glucose-Capillary: 100 mg/dL — ABNORMAL HIGH (ref 70–99)

## 2022-06-02 NOTE — Progress Notes (Signed)
[name] ([id number]) [phone].pdf Page 1 of 2 Visit Report for 06/02/2022 Arrival Information Details Patient Name: Date of Service: [name], [name] 06/02/2022 10:00 [name] Medical Record Number: [id number] Patient Account Number: [id number] Date of Birth/Sex: Treating RN: 1956/04/11 (66 y.o. [name] Primary Care Toleen Lachapelle: Eino Farber, DA RREN Other Clinician: Valeria Batman Referring Dajohn Ellender: Treating Tyronda Vizcarrondo/Extender: [name] BA TES, DA RREN Weeks in Treatment: 7 Visit Information History Since Last Visit All ordered tests and consults were completed: Yes Patient Arrived: Wheel Chair Added or deleted any medications: No Arrival Time: 08:58 Any new allergies or adverse reactions: No Accompanied By: None Had [name] fall or experienced change in No Transfer Assistance: EasyPivot Patient Lift activities of daily living that may affect Patient Identification Verified: Yes risk of falls: Secondary Verification Process Completed: Yes Signs or symptoms of abuse/neglect since last visito No Patient Requires Transmission-Based Precautions: No Hospitalized since last visit: No Patient Has Alerts: No Implantable device outside of the clinic excluding No cellular tissue based products placed in the center since last visit: Pain Present Now: No Electronic Signature(s) Signed: 06/02/2022 1:53:07 PM By: Valeria Batman EMT Entered By: Valeria Batman on 06/02/2022 13:53:06 -------------------------------------------------------------------------------- Encounter Discharge Information Details Patient Name: Date of Service: [name], [name] 06/02/2022 10:00 [name] Medical Record Number: [id number] Patient Account Number: [id number] Date of Birth/Sex: Treating RN: 1956-01-31 (66 y.o. [name] Primary Care Naresh Althaus: Eino Farber, DA RREN Other Clinician: Valeria Batman Referring Debroah Shuttleworth: Treating Adaleena Mooers/Extender: [name] BA  TES, DA RREN Weeks in Treatment: 7 Encounter Discharge Information Items Discharge Condition: Stable Ambulatory Status: Wheelchair Discharge Destination: Home Transportation: Private Auto Accompanied By: None Schedule Follow-up Appointment: Yes Clinical Summary of Care: Electronic Signature(s) Signed: 06/02/2022 2:05:21 PM By: Valeria Batman EMT Entered By: Valeria Batman on 06/02/2022 14:05:21 [name] ([id number]) [id number] Page 2 of 2 -------------------------------------------------------------------------------- Vitals Details Patient Name: Date of Service: [name], [name] 06/02/2022 10:00 [name] Medical Record Number: [id number] Patient Account Number: [id number] Date of Birth/Sex: Treating RN: [birth year] (66 y.o. [name] Primary Care Fleet Higham: Eino Farber, DA RREN Other Clinician: Valeria Batman Referring Sable Knoles: Treating Kayanna Mckillop/Extender: [name] BA TES, DA RREN Weeks in Treatment: 7 Vital Signs Time Taken: 09:21 Temperature (F): 98.0 Height (in): 72 Pulse (bpm): 83 Weight (lbs): 220 Respiratory Rate (breaths/min): 16 Body Mass Index (BMI): 29.8 Blood Pressure (mmHg): 105/60 Capillary Blood Glucose (mg/dl): 141 Reference Range: 80 - 120 mg / dl Electronic Signature(s) Signed: 06/02/2022 1:53:32 PM By: Valeria Batman EMT Entered By: Valeria Batman on 06/02/2022 13:53:32

## 2022-06-02 NOTE — Progress Notes (Signed)
RAN, TULLIS A ([id number]) [id number] Page 1 of 2 Visit Report for 06/02/2022 Problem List Details Patient Name: Date of Service: [name], [name] 06/02/2022 10:00 A M Medical Record Number: [id number] Patient Account Number: [id number] Date of Birth/Sex: Treating RN: May 17, 1956 (66 y.o. [name] Primary Care Provider: Eino Farber, DA RREN Other Clinician: Valeria Batman Referring Provider: Treating Provider/Extender: [name] BA TES, DA RREN Weeks in Treatment: 7 Active Problems ICD-10 Encounter Code Description Active Date MDM Diagnosis L97.522 Non-pressure chronic ulcer of other part of left foot with fat 04/14/2022 No Yes layer exposed M86.172 Other acute osteomyelitis, left ankle and foot 05/08/2022 No Yes L97.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 04/14/2022 No Yes specified severity E11.621 Type 2 diabetes mellitus with foot ulcer 04/14/2022 No Yes E11.42 Type 2 diabetes mellitus with diabetic polyneuropathy 04/14/2022 No Yes Inactive Problems Resolved Problems Electronic Signature(s) Signed: 06/02/2022 2:04:50 PM By: Valeria Batman EMT Signed: 06/02/2022 4:03:55 PM By: [name] DO Entered By: Valeria Batman on 06/02/2022 14:04:49 -------------------------------------------------------------------------------- SuperBill Details Patient Name: Date of Service: [name], [place] [name] 06/02/2022 Medical Record Number: [id number] Patient Account Number: [id number] Date of Birth/Sex: Treating RN: [birth year] (66 y.o. 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 Facility Procedures : CPT4 Code Description: [id number] G0277-(Facility Use Only) HBOT full body chamber, 55mn , ICD-10 Diagnosis Description E11.621 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.522 Non-pressure chronic ulcer of other part of left foot with f L97.528  Non-pressure chronic ulcer of other part of left foot with o E11.42 Type 2 diabetes mellitus with diabetic polyneuropathy Modifier: at layer exposed ther specified se Quantity: 5 verity Physician Procedures : CPT4 Code Description Modifier [id number] 27517- WC PHYS HYPERBARIC OXYGEN THERAPY ICD-10 Diagnosis Description E11.621 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

## 2022-06-02 NOTE — Progress Notes (Addendum)
LAMOUNT, BANKSON [name] ([id number]) [id number] Page 1 of 2 Visit Report for 06/02/2022 HBO Details Patient Name: Date of Service: [name], [name]. 06/02/2022 10:00 [name] Medical Record Number: [id number] Patient Account Number: [id number] Date of Birth/Sex: Treating RN: [birth year], [name] (66 y.o. [name] Primary Care Ayssa Bentivegna: Eino Farber, DA RREN Other Clinician: Valeria Batman Referring Gabino Hagin: Treating Tyrese Capriotti/Extender: Kalman [name] BA TES, DA RREN Weeks in Treatment: 7 HBO Treatment Course Details Treatment Course Number: 1 Ordering Ardell Aaronson: Kalman [name] T Treatments Ordered: otal 40 HBO Treatment Start Date: 06/01/2022 HBO Indication: Diabetic Ulcer(s) of the Lower Extremity Standard/Conservative Wound Care tried and failed greater than or equal to [name] days Wound #4 Left, Dorsal Foot , Wound #5 Left, Lateral Foot HBO Treatment Details Treatment Number: 2 Patient Type: Outpatient Chamber Type: Monoplace Chamber Serial #: [id number] Treatment Protocol: 2.5 ATA with 90 minutes oxygen, with two 5 minute air breaks Treatment Details Compression Rate Down: 1.0 psi / minute De-Compression Rate Up: 1.0 psi / minute [name] breaks and breathing ir Compress Tx Pressure periods Decompress Decompress Begins Reached (leave unused spaces Begins Ends blank) Chamber Pressure (ATA 1 2.5 2.5 2.5 2.5 2.5 - - 2.5 1 ) Clock Time (24 hr) 09:34 09:59 10:29 10:34 11:04 11:09 - - 11:39 12:00 Treatment Length: 146 (minutes) Treatment Segments: 5 Vital Signs Capillary Blood Glucose Reference Range: 80 - 120 mg / dl HBO Diabetic Blood Glucose Intervention Range: <131 mg/dl or >249 mg/dl Type: Time Vitals Blood Pulse: Respiratory Temperature: Capillary Blood Glucose Pulse Action Taken: Pressure: Rate: Glucose (mg/dl): Meter #: Oximetry (%) Taken: Pre 09:21 105/60 83 16 98 141 Post 12:07 102/64 70 16 976 86 Patient given Glucerna. Waited for 67mn after. Treatment  Response Treatment Toleration: Well Treatment Completion Status: Treatment Completed without Adverse Event Treatment Notes Patient stated that he had steak, egg, cheese bagel with for breakfast. Patient travel rate set to 1.0 PSI/min. The patient did have some problem clearing at [facility]. travel stopped until he was able to clear. Travel restarted with no other problems noted. treatment tolerated well. Psot Blood sugar was 86, the patient was given 8oz of Glucerna. At 1245 rechecked blood sugar was 100. Patient given another Glucerna. Stated that he felt fine. Additional Procedure Documentation Tissue Sevierity: Necrosis of bone Physician HBO Attestation: I certify that I supervised this HBO treatment in accordance with Medicare guidelines. [name] trained emergency response team is readily available per Yes hospital policies and procedures. Continue HBOT as ordered. Yes Electronic [name](s) Signed: 06/02/2022 4:03:55 PM By: [name]: 06/02/2022 2:04:14 PM Version By: GValeria BatmanEMT TPeri JeffersonA ([id number]) [id number] Page 2 of 2 Previous [name]: 06/02/2022 2:04:14 PM Version By: GValeria BatmanEMT Entered By: [name] Shanon 06/02/2022 15:11:23 -------------------------------------------------------------------------------- HBO Safety Checklist Details Patient Name: Date of Service: [name] J[place]loris [place] [name] 06/02/2022 10:00 [name] Medical Record Number: [id number] Account Number: [id number] of Birth/Sex: Treating RN: 1Jul [name], [name](66y.o. MMare FerrariPrimary Care Shakeda Pearse: BEino Farber DA RREN Other Clinician: GValeria BatmanReferring Emersyn Wyss: Treating Keegen Heffern/Extender: [name] ShanBA TES, DA RREN Weeks in Treatment: 7 HBO Safety Checklist Items Safety Checklist Consent Form Signed Patient voided / foley secured and emptied When did you last eato 0830 Last dose of injectable or oral agent 0745 Ostomy  pouch emptied and vented if applicable NA All implantable devices assessed, documented and approved NA Intravenous access site secured and place NA Valuables secured Linens and cotton and cotton/polyester blend (  less than 51% polyester) Personal oil-based products / skin lotions / body lotions removed Wigs or hairpieces removed NA Smoking or tobacco materials removed Books / newspapers / magazines / loose paper removed Cologne, aftershave, perfume and deodorant removed Jewelry removed (may wrap wedding band) Make-up removed NA Hair care products removed Battery operated devices (external) removed Heating patches and chemical warmers removed Titanium eyewear removed NA Nail polish cured greater than 10 hours NA Casting material cured greater than 10 hours NA Hearing aids removed NA Loose dentures or partials removed removed by patient Prosthetics have been removed NA Patient demonstrates correct use of air break device (if applicable) Patient concerns have been addressed Patient grounding bracelet on and cord attached to chamber Specifics for Inpatients (complete in addition to above) Medication sheet sent with patient NA Intravenous medications needed or due during therapy sent with patient NA Drainage tubes (e.g. nasogastric tube or chest tube secured and vented) NA Endotracheal or Tracheotomy tube secured NA Cuff deflated of air and inflated with saline NA Airway suctioned NA Notes The safety check was done before the treatment was started. Electronic [name](s) Signed: 06/02/2022 1:54:28 PM By: Valeria Batman EMT Entered By: Valeria Batman on 06/02/2022 13:54:28

## 2022-06-03 ENCOUNTER — Encounter (HOSPITAL_BASED_OUTPATIENT_CLINIC_OR_DEPARTMENT_OTHER): Payer: Medicare HMO | Admitting: General Surgery

## 2022-06-03 ENCOUNTER — Telehealth: Payer: Self-pay | Admitting: *Deleted

## 2022-06-03 DIAGNOSIS — E11621 Type 2 diabetes mellitus with foot ulcer: Secondary | ICD-10-CM | POA: Diagnosis not present

## 2022-06-03 LAB — GLUCOSE, CAPILLARY
Glucose-Capillary: 252 mg/dL — ABNORMAL HIGH (ref 70–99)
Glucose-Capillary: 140 mg/dL — ABNORMAL HIGH (ref 70–99)

## 2022-06-03 NOTE — Progress Notes (Signed)
ANTWAINE, BOOMHOWER A ([id number]) [id number] Page 1 of 2 Visit Report for 06/03/2022 Problem List Details Patient Name: Date of Service: [name], [name] 06/03/2022 8:00 A M Medical Record Number: [id number] Patient Account Number: [id number] Date of Birth/Sex: Treating RN: 1955/07/26 (66 y.o. 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 06/03/2022 Medical Record Number: [id number] Patient Account Number: [id number] Date of Birth/Sex: Treating RN: 1955/08/01 (66 y.o. 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.621 Type 2 diabetes mellitus with foot ulcer E11.42 Type 2 diabetes mellitus with diabetic polyneuropathy Facility Procedures : CPT4 Code Description: [id number]-(Facility Use Only) HBOT full body chamber, 77mn , ICD-10 Diagnosis Description E11.621 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.528 Non-pressure chronic ulcer of other part of left foot with o L97.522  Non-pressure chronic ulcer of other part of left foot with f E11.42 Type 2 diabetes mellitus with diabetic polyneuropathy Modifier: ther specified se at layer exposed Quantity: 5 verity Physician Procedures : CPT4 Code Description Modifier [id number] PHYS HYPERBARIC OXYGEN THERAPY ICD-10 Diagnosis Description E11.621 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

## 2022-06-03 NOTE — Telephone Encounter (Signed)
Nurse with [facility] is asking for verbal orders to decrease skilled nursing visits to once a week, will have caregiver to do the rest of the week, patient going to be doing hyperbaric procedures Mon.thru Fri.daily for his wound care.

## 2022-06-03 NOTE — Progress Notes (Addendum)
NOA, CONSTANTE A ([id number]) [id number] Page 1 of 2 Visit Report for 06/03/2022 Arrival Information Details Patient Name: Date of Service: [name], [name] 06/03/2022 8:00 A M Medical Record Number: [id number] Patient Account Number: [id number] Date of Birth/Sex: Treating RN: [birth year] (66 y.o. [name] Primary Care Madix Blowe: Eino Farber, DA RREN Other Clinician: [name] Referring Philamena Kramar: Treating Havanah Nelms/Extender: Fredirick Maudlin BA TES, DA RREN Weeks in Treatment: 7 Visit Information History Since Last Visit All ordered tests and consults were completed: Yes Patient Arrived: Wheel Chair Added or deleted any medications: No Arrival Time: 08:15 Any new allergies or adverse reactions: No Accompanied By: None Had a fall or experienced change in No Transfer Assistance: None activities of daily living that may affect Patient Identification Verified: Yes risk of falls: Secondary Verification Process Completed: Yes Signs or symptoms of abuse/neglect since last visito No Patient Requires Transmission-Based Precautions: No Hospitalized since last visit: No Patient Has Alerts: No Implantable device outside of the clinic excluding No cellular tissue based products placed in the center since last visit: Pain Present Now: No Electronic Signature(s) Signed: 06/03/2022 1:28:38 PM By: [name] EMT Entered By: [name] on 06/03/2022 13:28:38 -------------------------------------------------------------------------------- Encounter Discharge Information Details Patient Name: Date of Service: [name], [place] [name] 06/03/2022 8:00 A M Medical Record Number: [id number] Patient Account Number: [id number] Date of Birth/Sex: Treating RN: 1955/08/25 (66 y.o. [name] Primary Care Salvador Coupe: Eino Farber, DA RREN Other Clinician: [name] Referring Natoya Viscomi: Treating Jemia Fata/Extender: Fredirick Maudlin BA TES, DA RREN Weeks  in Treatment: 7 Encounter Discharge Information Items Discharge Condition: Stable Ambulatory Status: Wheelchair Discharge Destination: Home Transportation: Private Auto Accompanied By: None Schedule Follow-up Appointment: Yes Clinical Summary of Care: Electronic Signature(s) Signed: 06/03/2022 1:46:36 PM By: [name] EMT Entered By: [name] on 06/03/2022 13:46:36 [name] ([id number]) [id number] Page 2 of 2 -------------------------------------------------------------------------------- Vitals Details Patient Name: Date of Service: [name], [name] 06/03/2022 8:00 A M Medical Record Number: [id number] Patient Account Number: [id number] Date of Birth/Sex: Treating RN: 05/25/1956 (66 y.o. [name] Primary Care Rechelle Niebla: Eino Farber, DA RREN Other Clinician: [name] Referring Tevita Gomer: Treating Tywana Robotham/Extender: Fredirick Maudlin BA TES, DA RREN Weeks in Treatment: 7 Vital Signs Time Taken: 08:39 Temperature (F): 98.0 Height (in): 72 Pulse (bpm): 86 Weight (lbs): 220 Respiratory Rate (breaths/min): 18 Body Mass Index (BMI): 29.8 Blood Pressure (mmHg): 109/74 Capillary Blood Glucose (mg/dl): 252 Reference Range: 80 - 120 mg / dl Electronic Signature(s) Signed: 06/03/2022 1:29:06 PM By: [name] EMT Entered By: [name] on 06/03/2022 13:29:06

## 2022-06-03 NOTE — Progress Notes (Addendum)
[name], [name] ([id number]) [id number] Page 1 of 2 Visit Report for 06/03/2022 HBO Details Patient Name: Date of Service: [name], [name] 06/03/2022 8:00 A M Medical Record Number: [id number] Patient Account Number: [id number] Date of Birth/Sex: Treating RN: 1955/07/30 (66 y.o. [name] Primary Care Natajah Derderian: Eino Farber, DA RREN Other Clinician: Valeria Batman Referring Janine Reller: Treating Ofilia Rayon/Extender: Fredirick Maudlin BA TES, DA RREN Weeks in Treatment: 7 HBO Treatment Course Details Treatment Course Number: 1 Ordering Yexalen Deike: [name] T Treatments Ordered: otal 40 HBO Treatment Start Date: 06/01/2022 HBO Indication: Diabetic Ulcer(s) of the Lower Extremity Standard/Conservative Wound Care tried and failed greater than or equal to 30 days Wound #4 Left, Dorsal Foot , Wound #5 Left, Lateral Foot HBO Treatment Details Treatment Number: 3 Patient Type: Outpatient Chamber Type: Monoplace Chamber Serial #: [id number] Treatment Protocol: 2.5 ATA with 90 minutes oxygen, with two 5 minute air breaks Treatment Details Compression Rate Down: 1.5 psi / minute De-Compression Rate Up: 1.5 psi / minute A breaks and breathing ir Compress Tx Pressure periods Decompress Decompress Begins Reached (leave unused spaces Begins Ends blank) Chamber Pressure (ATA 1 2.5 2.5 2.5 2.5 2.5 - - 2.5 1 ) Clock Time (24 hr) 08:50 09:13 09:43 09:48 10:18 10:23 - - 10:53 11:11 Treatment Length: 141 (minutes) Treatment Segments: 5 Vital Signs Capillary Blood Glucose Reference Range: 80 - 120 mg / dl HBO Diabetic Blood Glucose Intervention Range: <131 mg/dl or >249 mg/dl Time Vitals Blood Respiratory Capillary Blood Glucose Pulse Action Type: Pulse: Temperature: Taken: Pressure: Rate: Glucose (mg/dl): Meter #: Oximetry (%) Taken: Pre 08:39 109/74 86 18 98 252 Post 11:18 125/67 69 18 98.1 140 Treatment Response Treatment Toleration: Well Treatment  Completion Status: Treatment Completed without Adverse Event Additional Procedure Documentation Tissue Sevierity: Necrosis of bone Physician HBO Attestation: I certify that I supervised this HBO treatment in accordance with Medicare guidelines. A trained emergency response team is readily available per Yes hospital policies and procedures. Continue HBOT as ordered. Yes Electronic Signature(s) Signed: 06/03/2022 4:27:59 PM By: Fredirick Maudlin MD FACS Previous Signature: 06/03/2022 1:44:14 PM Version By: Valeria Batman EMT Entered By: Fredirick Maudlin on 06/03/2022 16:27:58 [name] ([id number]) [id number] Page 2 of 2 -------------------------------------------------------------------------------- HBO Safety Checklist Details Patient Name: Date of Service: [name], [name] 06/03/2022 8:00 A M Medical Record Number: [id number] Patient Account Number: [id number] Date of Birth/Sex: Treating RN: 07-29-1955 (66 y.o. [name] Primary Care Silveria Botz: Eino Farber, DA RREN Other Clinician: Valeria Batman Referring Mirza Fessel: Treating Dreshaun Stene/Extender: Fredirick Maudlin BA TES, DA RREN Weeks in Treatment: 7 HBO Safety Checklist Items Safety Checklist Consent Form Signed Patient voided / foley secured and emptied When did you last eato 0715 Last dose of injectable or oral agent 0700 Ostomy pouch emptied and vented if applicable NA All implantable devices assessed, documented and approved NA Intravenous access site secured and place NA Valuables secured Linens and cotton and cotton/polyester blend (less than 51% polyester) Personal oil-based products / skin lotions / body lotions removed Wigs or hairpieces removed NA Smoking or tobacco materials removed Books / newspapers / magazines / loose paper removed Cologne, aftershave, perfume and deodorant removed Jewelry removed (may wrap wedding band) Make-up removed NA Hair care products  removed Battery operated devices (external) removed Heating patches and chemical warmers removed Titanium eyewear removed NA Nail polish cured greater than 10 hours NA Casting material cured greater than 10 hours NA Hearing aids removed NA Loose dentures or partials removed  removed by patient Prosthetics have been removed NA Patient demonstrates correct use of air break device (if applicable) Patient concerns have been addressed Patient grounding bracelet on and cord attached to chamber Specifics for Inpatients (complete in addition to above) Medication sheet sent with patient NA Intravenous medications needed or due during therapy sent with patient NA Drainage tubes (e.g. nasogastric tube or chest tube secured and vented) NA Endotracheal or Tracheotomy tube secured NA Cuff deflated of air and inflated with saline NA Airway suctioned NA Notes The safety check was done before the treatment was started. Electronic Signature(s) Signed: 06/03/2022 1:30:07 PM By: Valeria Batman EMT Entered By: Valeria Batman on 06/03/2022 13:30:06

## 2022-06-04 ENCOUNTER — Encounter (HOSPITAL_BASED_OUTPATIENT_CLINIC_OR_DEPARTMENT_OTHER): Payer: Medicare HMO | Admitting: General Surgery

## 2022-06-04 DIAGNOSIS — E11621 Type 2 diabetes mellitus with foot ulcer: Secondary | ICD-10-CM | POA: Diagnosis not present

## 2022-06-04 LAB — GLUCOSE, CAPILLARY
Glucose-Capillary: 276 mg/dL — ABNORMAL HIGH (ref 70–99)
Glucose-Capillary: 210 mg/dL — ABNORMAL HIGH (ref 70–99)

## 2022-06-04 NOTE — Telephone Encounter (Signed)
Called [name] with [facility] giving verbal orders to decrease skilled nursing visits back to once weekly,caregiver will do rest of the week. She verbalized understanding.

## 2022-06-04 NOTE — Progress Notes (Signed)
[name] [name] ([id number]) [id number] Page 1 of 2 Visit Report for 06/04/2022 Arrival Information Details Patient Name: Date of Service: [name] [name], [name] [name]. 06/04/2022 8:00 [name] Medical Record Number: [id number] Patient Account Number: [id number] Date of Birth/Sex: Treating RN: [birth year] (66 y.o. [name] [name] Primary Care Yoanna Jurczyk: Eino Farber, DA RREN Other Clinician: Valeria Batman Referring Tykeshia Tourangeau: Treating Daquann Merriott/Extender: Fredirick Maudlin BA TES, DA RREN Weeks in Treatment: 7 Visit Information History Since Last Visit All ordered tests and consults were completed: Yes Patient Arrived: Wheel Chair Added or deleted any medications: No Arrival Time: 08:17 Any new allergies or adverse reactions: No Accompanied By: None Had [name] fall or experienced change in No Transfer Assistance: EasyPivot Patient Lift activities of daily living that may affect Patient Identification Verified: Yes risk of falls: Secondary Verification Process Completed: Yes Signs or symptoms of abuse/neglect since last visito No Patient Requires Transmission-Based Precautions: No Hospitalized since last visit: No Patient Has Alerts: No Implantable device outside of the clinic excluding No cellular tissue based products placed in the center since last visit: Pain Present Now: No Electronic Signature(s) Signed: 06/04/2022 2:14:39 PM By: Valeria Batman EMT Entered By: Valeria Batman on 06/04/2022 14:14:39 -------------------------------------------------------------------------------- Encounter Discharge Information Details Patient Name: Date of Service: [name] [name], [name] [name]. 06/04/2022 8:00 [name] Medical Record Number: [id number] Patient Account Number: [id number] Date of Birth/Sex: Treating RN: 01/27/1956 (66 y.o. [name] [name] Primary Care Natesha Hassey: Eino Farber, DA RREN Other Clinician: Valeria Batman Referring Zachry Hopfensperger: Treating Damarko Stitely/Extender: Fredirick Maudlin BA TES, DA RREN Weeks in Treatment: 7 Encounter Discharge Information Items Discharge Condition: Stable Ambulatory Status: Wheelchair Discharge Destination: Home Transportation: Private Auto Accompanied By: None Schedule Follow-up Appointment: Yes Clinical Summary of Care: Electronic Signature(s) Signed: 06/04/2022 2:36:11 PM By: Valeria Batman EMT Entered By: Valeria Batman on 06/04/2022 14:36:11 [name] [name] ([id number]) [id number] Page 2 of 2 -------------------------------------------------------------------------------- Vitals Details Patient Name: Date of Service: [name] [name], [name] [name]. 06/04/2022 8:00 [name] Medical Record Number: [id number] Patient Account Number: [id number] Date of Birth/Sex: Treating RN: 03-Nov-1955 (66 y.o. [name] [name] Primary Care Cherylee Rawlinson: Eino Farber, DA RREN Other Clinician: Valeria Batman Referring Garald Rhew: Treating Zora Glendenning/Extender: Fredirick Maudlin BA TES, DA RREN Weeks in Treatment: 7 Vital Signs Time Taken: 08:36 Temperature (F): 98.1 Height (in): 72 Pulse (bpm): 78 Weight (lbs): 220 Respiratory Rate (breaths/min): 16 Body Mass Index (BMI): 29.8 Blood Pressure (mmHg): 120/84 Capillary Blood Glucose (mg/dl): 276 Reference Range: 80 - 120 mg / dl Electronic Signature(s) Signed: 06/04/2022 2:15:14 PM By: Valeria Batman EMT Entered By: Valeria Batman on 06/04/2022 14:15:14

## 2022-06-04 NOTE — Progress Notes (Signed)
[name], [name] ([id number]) [id number] Page 1 of 2 Visit Report for 06/04/2022 HBO Details Patient Name: Date of Service: [name], [name]. 06/04/2022 8:00 [name] Medical Record Number: [id number] Patient Account Number: [id number] Date of Birth/Sex: Treating RN: 07-May-1956 (66 y.o. [name] Primary Care Marybelle Giraldo: Eino Farber, DA RREN Other Clinician: Valeria Batman Referring Mozetta Murfin: Treating Reannah Totten/Extender: Fredirick Maudlin BA TES, DA RREN Weeks in Treatment: 7 HBO Treatment Course Details Treatment Course Number: 1 Ordering Esmeralda Blanford: [name] T Treatments Ordered: otal 40 HBO Treatment Start Date: 06/01/2022 HBO Indication: Diabetic Ulcer(s) of the Lower Extremity Standard/Conservative Wound Care tried and failed greater than or equal to 30 days Wound #4 Left, Dorsal Foot , Wound #5 Left, Lateral Foot HBO Treatment Details Treatment Number: 4 Patient Type: Outpatient Chamber Type: Monoplace Chamber Serial #: [id number] Treatment Protocol: 2.5 ATA with 90 minutes oxygen, with two 5 minute air breaks Treatment Details Compression Rate Down: 1.0 psi / minute De-Compression Rate Up: [name] breaks and breathing ir Compress Tx Pressure periods Decompress Decompress Begins Reached (leave unused spaces Begins Ends blank) Chamber Pressure (ATA 1 2.5 2.5 2.5 2.5 2.5 - - 2.5 1 ) Clock Time (24 hr) 08:42 09:08 09:38 09:43 10:13 10:18 - - 10:48 11:09 Treatment Length: 147 (minutes) Treatment Segments: 5 Vital Signs Capillary Blood Glucose Reference Range: 80 - 120 mg / dl HBO Diabetic Blood Glucose Intervention Range: <131 mg/dl or >249 mg/dl Time Vitals Blood Respiratory Capillary Blood Glucose Pulse Action Type: Pulse: Temperature: Taken: Pressure: Rate: Glucose (mg/dl): Meter #: Oximetry (%) Taken: Pre 08:36 120/84 78 16 98.1 276 Post 11:16 143/78 79 18 98.7 210 Treatment Response Treatment Toleration: Well Treatment Completion  Status: Treatment Completed without Adverse Event Treatment Notes The patient had problems with clearing his right ears today. Compression travel rate set at 1.0 PSi/min. At 6PSI he was unable to clear. reduce pressure to 4 PSI, before he was able to clear. compression restarted; at 10PSI he was again unable to clear. compression reduce presser to 8 PSI, before he was able to clear. compression restarted with no other problems to 2.5ATA. The parent was able to clear both ears on decompression at [name] rate of 1.0 . I asked Dr. Celine [name] to come to see the patient. She spoke with the patient after looking at his ears. Additional Procedure Documentation Tissue Sevierity: Necrosis of bone Physician HBO Attestation: I certify that I supervised this HBO treatment in accordance with Medicare guidelines. [name] trained emergency response team is readily available per Yes hospital policies and procedures. Continue HBOT as ordered. Yes Electronic Signature(s) Signed: 06/04/2022 3:05:30 PM By: Fredirick Maudlin MD FACS [name] ([id number]) PM By: Fredirick Maudlin MD FACS [phone].pdf Page 2 of 2 Signed: 06/04/2022 3:05:30 Previous Signature: 06/04/2022 2:34:56 PM Version By: Valeria Batman EMT Entered By: Fredirick Maudlin on 06/04/2022 15:05:30 -------------------------------------------------------------------------------- HBO Safety Checklist Details Patient Name: Date of Service: [name], [name]. 06/04/2022 8:00 [name] Medical Record Number: [id number] Patient Account Number: [id number] Date of Birth/Sex: Treating RN: 1956/05/07 (66 y.o. [name] Primary Care Dalton Molesworth: Eino Farber, DA RREN Other Clinician: Valeria Batman Referring Hulet Ehrmann: Treating Panagiotis Oelkers/Extender: Fredirick Maudlin BA TES, DA RREN Weeks in Treatment: 7 HBO Safety Checklist Items Safety Checklist Consent Form Signed Patient voided / foley secured and emptied When did you last eato 0700 Last  dose of injectable or oral agent last night Ostomy pouch emptied and vented if applicable NA All implantable devices assessed, documented and  approved NA Intravenous access site secured and place Valuables secured Linens and cotton and cotton/polyester blend (less than 51% polyester) Personal oil-based products / skin lotions / body lotions removed Wigs or hairpieces removed NA Smoking or tobacco materials removed Books / newspapers / magazines / loose paper removed Cologne, aftershave, perfume and deodorant removed Jewelry removed (may wrap wedding band) Make-up removed NA Hair care products removed Battery operated devices (external) removed Heating patches and chemical warmers removed Titanium eyewear removed NA Nail polish cured greater than 10 hours NA Casting material cured greater than 10 hours NA Hearing aids removed NA Loose dentures or partials removed removed by patient Prosthetics have been removed NA Patient demonstrates correct use of air break device (if applicable) Patient concerns have been addressed Patient grounding bracelet on and cord attached to chamber Specifics for Inpatients (complete in addition to above) Medication sheet sent with patient NA Intravenous medications needed or due during therapy sent with patient NA Drainage tubes (e.g. nasogastric tube or chest tube secured and vented) NA Endotracheal or Tracheotomy tube secured NA Cuff deflated of air and inflated with saline NA Airway suctioned NA Notes The safety check was done before the treatment was started. Electronic Signature(s) Signed: 06/04/2022 2:17:31 PM By: Valeria Batman EMT Entered By: Valeria Batman on 06/04/2022 14:17:31

## 2022-06-04 NOTE — Progress Notes (Signed)
DAYSEAN, TINKHAM A ([id number]) [id number] Page 1 of 2 Visit Report for 06/04/2022 Problem List Details Patient Name: Date of Service: [name], [name] 06/04/2022 8:00 A M Medical Record Number: [id number] Patient Account Number: [id number] Date of Birth/Sex: Treating RN: [birth year] (66 y.o. 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 06/04/2022 Medical Record Number: [id number] Patient Account Number: [id number] Date of Birth/Sex: Treating RN: 1956-03-07 (66 y.o. 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.42 Type 2 diabetes mellitus with diabetic polyneuropathy Facility Procedures : CPT4 Code Description: [id number] G0277-(Facility Use Only) HBOT full body chamber, 9mn , ICD-10 Diagnosis Description E11.621 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.522 Non-pressure chronic ulcer of other part of left foot with f L97.528  Non-pressure chronic ulcer of other part of left foot with o E11.42 Type 2 diabetes mellitus with diabetic polyneuropathy Modifier: at layer exposed ther specified se Quantity: 5 verity Physician Procedures : CPT4 Code Description Modifier [id number]- WC PHYS HYPERBARIC OXYGEN THERAPY ICD-10 Diagnosis Description E11.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

## 2022-06-05 ENCOUNTER — Encounter (HOSPITAL_BASED_OUTPATIENT_CLINIC_OR_DEPARTMENT_OTHER): Payer: Medicare HMO | Attending: Internal Medicine | Admitting: Internal Medicine

## 2022-06-05 DIAGNOSIS — L97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 specified severity: Secondary | ICD-10-CM | POA: Diagnosis not present

## 2022-06-05 DIAGNOSIS — Z951 Presence of aortocoronary bypass graft: Secondary | ICD-10-CM | POA: Diagnosis not present

## 2022-06-05 DIAGNOSIS — E1169 Type 2 diabetes mellitus with other specified complication: Secondary | ICD-10-CM | POA: Diagnosis not present

## 2022-06-05 DIAGNOSIS — Z95828 Presence of other vascular implants and grafts: Secondary | ICD-10-CM | POA: Insufficient documentation

## 2022-06-05 DIAGNOSIS — I10 Essential (primary) hypertension: Secondary | ICD-10-CM | POA: Diagnosis not present

## 2022-06-05 DIAGNOSIS — Z89431 Acquired absence of right foot: Secondary | ICD-10-CM | POA: Diagnosis not present

## 2022-06-05 DIAGNOSIS — M86172 Other acute osteomyelitis, left ankle and foot: Secondary | ICD-10-CM | POA: Insufficient documentation

## 2022-06-05 DIAGNOSIS — G4733 Obstructive sleep apnea (adult) (pediatric): Secondary | ICD-10-CM | POA: Diagnosis not present

## 2022-06-05 DIAGNOSIS — L97522 Non-pressure chronic ulcer of other part of left foot with fat layer exposed: Secondary | ICD-10-CM | POA: Insufficient documentation

## 2022-06-05 DIAGNOSIS — E11621 Type 2 diabetes mellitus with foot ulcer: Secondary | ICD-10-CM | POA: Diagnosis not present

## 2022-06-05 DIAGNOSIS — E1142 Type 2 diabetes mellitus with diabetic polyneuropathy: Secondary | ICD-10-CM | POA: Insufficient documentation

## 2022-06-05 LAB — GLUCOSE, CAPILLARY
Glucose-Capillary: 228 mg/dL — ABNORMAL HIGH (ref 70–99)
Glucose-Capillary: 173 mg/dL — ABNORMAL HIGH (ref 70–99)

## 2022-06-05 NOTE — Progress Notes (Signed)
[name] ([id number]) [id number] Page 1 of 2 Visit Report for 06/05/2022 Arrival Information Details Patient Name: Date of Service: [name], [name] 06/05/2022 8:00 [name] Medical Record Number: [id number] Patient Account Number: [id number] Date of Birth/Sex: Treating RN: Feb 23, 1956 (66 y.o. [name] Primary Care Shiya Fogelman: Eino Farber, DA RREN Other Clinician: Valeria Batman Referring Antwoin Lackey: Treating Jenisis Harmsen/Extender: [name] BA TES, DA RREN Weeks in Treatment: 7 Visit Information History Since Last Visit All ordered tests and consults were completed: Yes Patient Arrived: Wheel Chair Added or deleted any medications: No Arrival Time: 08:15 Any new allergies or adverse reactions: No Accompanied By: None Had [name] fall or experienced change in No Transfer Assistance: EasyPivot Patient Lift activities of daily living that may affect Patient Identification Verified: Yes risk of falls: Secondary Verification Process Completed: Yes Signs or symptoms of abuse/neglect since last visito No Patient Requires Transmission-Based Precautions: No Hospitalized since last visit: No Patient Has Alerts: No Implantable device outside of the clinic excluding No cellular tissue based products placed in the center since last visit: Pain Present Now: No Electronic Signature(s) Signed: 06/05/2022 1:20:41 PM By: Valeria Batman EMT Entered By: Valeria Batman on 06/05/2022 13:20:41 -------------------------------------------------------------------------------- Encounter Discharge Information Details Patient Name: Date of Service: [name], [name] 06/05/2022 8:00 [name] Medical Record Number: [id number] Patient Account Number: [id number] Date of Birth/Sex: Treating RN: 1955/11/28 (66 y.o. [name] Primary Care Lavalle Skoda: Eino Farber, DA RREN Other Clinician: Valeria Batman Referring Kayla Deshaies: Treating Kona Lover/Extender: [name] BA TES, DA  RREN Weeks in Treatment: 7 Encounter Discharge Information Items Discharge Condition: Stable Ambulatory Status: Wheelchair Discharge Destination: Home Transportation: Private Auto Accompanied By: None Schedule Follow-up Appointment: Yes Clinical Summary of Care: Electronic Signature(s) Signed: 06/05/2022 1:35:35 PM By: Valeria Batman EMT Entered By: Valeria Batman on 06/05/2022 13:35:35 [name] ([id number]) [id number] Page 2 of 2 -------------------------------------------------------------------------------- Vitals Details Patient Name: Date of Service: [name], [name] 06/05/2022 8:00 [name] Medical Record Number: [id number] Patient Account Number: [id number] Date of Birth/Sex: Treating RN: 07-02-1956 (66 y.o. [name] Primary Care Shaleen Talamantez: Eino Farber, DA RREN Other Clinician: Valeria Batman Referring Latiffany Harwick: Treating Yesica Kemler/Extender: [name] BA TES, DA RREN Weeks in Treatment: 7 Vital Signs Time Taken: 08:35 Temperature (F): 98.1 Height (in): 72 Pulse (bpm): 94 Weight (lbs): 220 Respiratory Rate (breaths/min): 16 Body Mass Index (BMI): 29.8 Blood Pressure (mmHg): 109/73 Capillary Blood Glucose (mg/dl): 228 Reference Range: 80 - 120 mg / dl Electronic Signature(s) Signed: 06/05/2022 1:21:13 PM By: Valeria Batman EMT Entered By: Valeria Batman on 06/05/2022 13:21:13

## 2022-06-05 NOTE — Progress Notes (Signed)
[name], [name] ([id number]) [id number] Page 1 of 2 Visit Report for 06/05/2022 HBO Details Patient Name: Date of Service: [name], [name] 06/05/2022 8:00 [name] Medical Record Number: [id number] Patient Account Number: [id number] Date of Birth/Sex: Treating RN: [birth year] (66 y.o. [name] Primary Care Margret Moat: Eino Farber, DA RREN Other Clinician: Valeria Batman Referring Chauntelle Azpeitia: Treating Okey Zelek/Extender: [name] BA TES, DA RREN Weeks in Treatment: 7 HBO Treatment Course Details Treatment Course Number: 1 Ordering Brendan Gadson: [name] T Treatments Ordered: otal 40 HBO Treatment Start Date: 06/01/2022 HBO Indication: Diabetic Ulcer(s) of the Lower Extremity Standard/Conservative Wound Care tried and failed greater than or equal to 30 days Wound #4 Left, Dorsal Foot , Wound #5 Left, Lateral Foot HBO Treatment Details Treatment Number: 5 Patient Type: Outpatient Chamber Type: Monoplace Chamber Serial #: [id number] Treatment Protocol: 2.5 ATA with 90 minutes oxygen, with two 5 minute air breaks Treatment Details Compression Rate Down: 1.0 psi / minute De-Compression Rate Up: 1.0 psi / minute [name] breaks and breathing ir Compress Tx Pressure periods Decompress Decompress Begins Reached (leave unused spaces Begins Ends blank) Chamber Pressure (ATA 1 2.5 2.5 2.5 2.5 2.5 - - 2.5 1 ) Clock Time (24 hr) 08:44 09:02 09:32 09:37 10:07 10:12 - - 10:42 11:04 Treatment Length: 140 (minutes) Treatment Segments: 5 Vital Signs Capillary Blood Glucose Reference Range: 80 - 120 mg / dl HBO Diabetic Blood Glucose Intervention Range: <131 mg/dl or >249 mg/dl Time Vitals Blood Respiratory Capillary Blood Glucose Pulse Action Type: Pulse: Temperature: Taken: Pressure: Rate: Glucose (mg/dl): Meter #: Oximetry (%) Taken: Pre 08:35 109/73 94 16 98.1 228 Post 11:14 132/70 79 18 98 173 Treatment Response Treatment Toleration: Well Treatment  Completion Status: Treatment Completed without Adverse Event Treatment Notes The patient did not have any problems clearing his ears today. He stated that he cleared his ears last night. Additional Procedure Documentation Tissue Sevierity: Necrosis of bone Physician HBO Attestation: I certify that I supervised this HBO treatment in accordance with Medicare guidelines. [name] trained emergency response team is readily available per Yes hospital policies and procedures. Continue HBOT as ordered. Yes Electronic Signature(s) Signed: 06/08/2022 12:19:38 PM By: [name] DO Previous Signature: 06/05/2022 1:34:32 PM Version By: Valeria Batman EMT Entered By: [name] on 06/08/2022 12:18:09 [name] ([id number]) [id number] Page 2 of 2 -------------------------------------------------------------------------------- HBO Safety Checklist Details Patient Name: Date of Service: [name], [name] 06/05/2022 8:00 [name] Medical Record Number: [id number] Patient Account Number: [id number] Date of Birth/Sex: Treating RN: [birth year] (66 y.o. [name], [name] Primary Care Anuradha Chabot: Eino Farber, DA RREN Other Clinician: Valeria Batman Referring Edie Vallandingham: Treating Avaleen Brownley/Extender: [name] BA TES, DA RREN Weeks in Treatment: 7 HBO Safety Checklist Items Safety Checklist Consent Form Signed Patient voided / foley secured and emptied When did you last eato 0700 Last dose of injectable or oral agent 0645 Ostomy pouch emptied and vented if applicable NA All implantable devices assessed, documented and approved NA Intravenous access site secured and place NA Valuables secured Linens and cotton and cotton/polyester blend (less than 51% polyester) Personal oil-based products / skin lotions / body lotions removed Wigs or hairpieces removed NA Smoking or tobacco materials removed Books / newspapers / magazines / loose paper removed Cologne, aftershave,  perfume and deodorant removed Jewelry removed (may wrap wedding band) Make-up removed NA Hair care products removed Battery operated devices (external) removed Heating patches and chemical warmers removed Titanium eyewear removed NA Nail polish cured greater  than 10 hours NA Casting material cured greater than 10 hours NA Hearing aids removed NA Loose dentures or partials removed removed by patient Prosthetics have been removed NA Patient demonstrates correct use of air break device (if applicable) Patient concerns have been addressed Patient grounding bracelet on and cord attached to chamber Specifics for Inpatients (complete in addition to above) Medication sheet sent with patient NA Intravenous medications needed or due during therapy sent with patient NA Drainage tubes (e.g. nasogastric tube or chest tube secured and vented) NA Endotracheal or Tracheotomy tube secured NA Cuff deflated of air and inflated with saline NA Airway suctioned NA Notes The safety check was done before the treatment was started. [employer] at [phone]. Electronic Signature(s) Signed: 06/05/2022 1:22:51 PM By: Valeria Batman EMT Entered By: Valeria Batman on 06/05/2022 13:22:50

## 2022-06-08 ENCOUNTER — Encounter (HOSPITAL_BASED_OUTPATIENT_CLINIC_OR_DEPARTMENT_OTHER): Payer: Medicare HMO | Admitting: Internal Medicine

## 2022-06-08 DIAGNOSIS — E1142 Type 2 diabetes mellitus with diabetic polyneuropathy: Secondary | ICD-10-CM

## 2022-06-08 DIAGNOSIS — E11621 Type 2 diabetes mellitus with foot ulcer: Secondary | ICD-10-CM

## 2022-06-08 DIAGNOSIS — L97522 Non-pressure chronic ulcer of other part of left foot with fat layer exposed: Secondary | ICD-10-CM

## 2022-06-08 DIAGNOSIS — L97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 specified severity: Secondary | ICD-10-CM | POA: Diagnosis not present

## 2022-06-08 LAB — GLUCOSE, CAPILLARY: Glucose-Capillary: 272 mg/dL — ABNORMAL HIGH (ref 70–99)

## 2022-06-08 NOTE — Progress Notes (Signed)
LEVEON, PELZER [name] ([id number]) [id number] Page 1 of 2 Visit Report for 06/05/2022 Problem List Details Patient Name: Date of Service: [name], [name]. 06/05/2022 8:00 [name] Medical Record Number: [id number] Patient Account Number: [id number] Date of Birth/Sex: Treating RN: [birth year] (66 y.o. 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 M86.172 Other acute osteomyelitis, left ankle and foot 05/08/2022 No Yes L97.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 04/14/2022 No Yes specified severity E11.621 Type 2 diabetes mellitus with foot ulcer 04/14/2022 No Yes E11.42 Type 2 diabetes mellitus with diabetic polyneuropathy 04/14/2022 No Yes Inactive Problems Resolved Problems Electronic Signature(s) Signed: 06/05/2022 1:35:08 PM By: Valeria Batman EMT Signed: 06/08/2022 12:19:38 PM By: [name] DO Entered By: Valeria Batman on 06/05/2022 13:35:08 -------------------------------------------------------------------------------- SuperBill Details Patient Name: Date of Service: [name], [name]. 06/05/2022 Medical Record Number: [id number] Patient Account Number: [id number] Date of Birth/Sex: Treating RN: Dec 25, 1955 (66 y.o. 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 specified severity E11.621 Type 2 diabetes mellitus with foot ulcer E11.42 Type 2 diabetes mellitus with diabetic polyneuropathy Facility Procedures : CPT4 Code Description: [id number] G0277-(Facility Use Only) HBOT full body chamber, 35mn , ICD-10 Diagnosis Description E11.621 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.522 Non-pressure chronic ulcer of other part of left foot with f L97.528  Non-pressure chronic ulcer of other part of left foot with o E11.42 Type 2 diabetes mellitus with diabetic polyneuropathy Modifier: at layer exposed ther specified se Quantity: 5 verity Physician Procedures : CPT4 Code Description Modifier [id number] 19417- WC PHYS HYPERBARIC OXYGEN THERAPY ICD-10 Diagnosis Description E11.621 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

## 2022-06-08 NOTE — Progress Notes (Addendum)
CALAN, DOREN A ([id number]) [id number] Page 1 of 3 Visit Report for 06/08/2022 HBO Details Patient Name: Date of Service: [name], [name] 06/08/2022 8:00 A M Medical Record Number: [id number] Patient Account Number: [id number] Date of Birth/Sex: Treating RN: 26-Jul-1955 (66 y.o. [name], [name] Primary Care Cillian Gwinner: Eino Farber, DA RREN Other Clinician: Valeria Batman Referring Bernardino Dowell: Treating Clement Deneault/Extender: [name] BA TES, DA RREN Weeks in Treatment: 7 HBO Treatment Course Details Treatment Course Number: 1 Ordering Daiki Dicostanzo: [name] T Treatments Ordered: otal 40 HBO Treatment Start Date: 06/01/2022 HBO Indication: Diabetic Ulcer(s) of the Lower Extremity Standard/Conservative Wound Care tried and failed greater than or equal to 30 days Wound #4 Left, Dorsal Foot , Wound #5 Left, Lateral Foot HBO Treatment Details Treatment Number: 6 Patient Type: Outpatient Chamber Type: Monoplace Chamber Serial #: [id number] Treatment Protocol: 2.5 ATA with 90 minutes oxygen, with two 5 minute air breaks Treatment Details Compression Rate Down: 1.0 psi / minute De-Compression Rate Up: A breaks and ir breathing Compress Tx Pressure Decompress Decompress periods Begins Reached Begins Ends (leave unused spaces blank) Chamber Pressure (ATA 1 2.5 2.5 2.5 2.5 2.5 - - 2.5 1 ) Clock Time (24 hr) 08:50 - - - - - --- 09:22 Treatment Length: 32 (minutes) Treatment Segments: 1 Vital Signs Capillary Blood Glucose Reference Range: 80 - 120 mg / dl HBO Diabetic Blood Glucose Intervention Range: <131 mg/dl or >249 mg/dl Time Vitals Blood Respiratory Capillary Blood Glucose Pulse Action Type: Pulse: Temperature: Taken: Pressure: Rate: Glucose (mg/dl): Meter #: Oximetry (%) Taken: Pre 08:39 120/65 77 18 98.2 272 Post 09:26 135/71 73 18 98.2 Treatment Response Treatment Toleration: Fair Adverse Events: 1:Barotrauma - Ear Treatment  Completion Status: Treatment Aborted/Not Restarted Reason: Adverse Event Treatment Notes Informed Dr. Heber [place] of the patient blood sugar. Started first dive at 0808. At 0855 at a compression rate of 1.0 at 6PSI, the patient complained of ear discomfort in right ear. Lowered pressure to 2.0 PSI. The patient was still unable to clear his right ear. The patient was removed from the chamber, and Dr. Heber [place] called to chamber side to see the patient. Dr. Heber [place] looked into his ears and stated that both ears had wax in them. He stated that his ears were clear at this time. Dr. Heber [place] and the patient talked about trying to restarting treatment again today. The patient was given Afrin 1 spray to each side. At 0915 compression restarted at a compression rate of 1.0 PSI/min. At 4 PSI he again was having discomfort in right ear. Treatment was Aborted. Dr. Heber [place] informed. Appointment was made for 06/09/2022 at 1500 for the patient to go to [place] [facility] [place]. The patient was informed of the appointment. Additional Procedure Documentation Tissue Sevierity: Necrosis of bone Jonhatan Hearty Notes FLOR, HOUDESHELL A ([id number]) [id number] Page 2 of 3 Patient had trouble clearing his ears during his initial dive. He was brought up and I evaluated his ears. He denied any pain. There was significant wax in both the ears. Ear canal normal. Afrin was given and a second attempt was made to dive however patient continued to have trouble clearing his ears. An appointment was made for this week for ENT for further evaluation. Physician HBO Attestation: I certify that I supervised this HBO treatment in accordance with Medicare guidelines. A trained emergency response team is readily available per Yes hospital policies and procedures. Continue HBOT as ordered. Yes Electronic Signature(s) Signed: 06/08/2022  3:20:41 PM By: [name]  DO Previous Signature: 06/08/2022 1:49:21 PM Version By: Valeria Batman EMT Previous Signature: 06/08/2022 1:48:22 PM Version By: Valeria Batman EMT Entered By: [name] on 06/08/2022 13:58:21 -------------------------------------------------------------------------------- HBO Safety Checklist Details Patient Name: Date of Service: [name], [name] 06/08/2022 8:00 A M Medical Record Number: [id number] Patient Account Number: [id number] Date of Birth/Sex: Treating RN: 1956-04-30 (66 y.o. [name], [name] Primary Care Illianna Paschal: Eino Farber, DA RREN Other Clinician: Valeria Batman Referring Shon Indelicato: Treating Tamyrah Burbage/Extender: [name] BA TES, DA RREN Weeks in Treatment: 7 HBO Safety Checklist Items Safety Checklist Consent Form Signed Patient voided / foley secured and emptied When did you last eato 0645 Last dose of injectable or oral agent 0640 Ostomy pouch emptied and vented if applicable NA All implantable devices assessed, documented and approved NA Intravenous access site secured and place NA Valuables secured Linens and cotton and cotton/polyester blend (less than 51% polyester) Personal oil-based products / skin lotions / body lotions removed Wigs or hairpieces removed NA Smoking or tobacco materials removed Books / newspapers / magazines / loose paper removed Cologne, aftershave, perfume and deodorant removed Jewelry removed (may wrap wedding band) Make-up removed NA Hair care products removed Battery operated devices (external) removed Heating patches and chemical warmers removed Titanium eyewear removed NA Nail polish cured greater than 10 hours NA Casting material cured greater than 10 hours NA Hearing aids removed NA Loose dentures or partials removed removed by patient Prosthetics have been removed NA Patient demonstrates correct use of air break device (if applicable) Patient concerns have been addressed Patient grounding bracelet  on and cord attached to chamber Specifics for Inpatients (complete in addition to above) Medication sheet sent with patient NA Intravenous medications needed or due during therapy sent with patient NA Drainage tubes (e.g. nasogastric tube or chest tube secured and vented) NA Endotracheal or Tracheotomy tube secured NA Cuff deflated of air and inflated with saline NA Airway suctioned NA EATHEN, BUDREAU A ([id number]) [id number] Page 3 of 3 Notes The safety check was done before the treatment was started. The patient stated that the had a sausage on a bun, juice and fruit cup for breakfast. Electronic Signature(s) Signed: 06/08/2022 1:23:50 PM By: Valeria Batman EMT Entered By: Valeria Batman on 06/08/2022 13:23:50

## 2022-06-08 NOTE — Progress Notes (Signed)
[name] ([id number]) [id number] Page 1 of 2 Visit Report for 06/08/2022 Arrival Information Details Patient Name: Date of Service: [name], [name] 06/08/2022 8:00 [name] Medical Record Number: [id number] Patient Account Number: [id number] Date of Birth/Sex: Treating RN: 09/10/1955 (66 y.o. [name], [name] Primary Care Hawkin Charo: Eino Farber, DA RREN Other Clinician: Valeria Batman Referring Hance Caspers: Treating Burdette Gergely/Extender: [name] BA TES, DA RREN Weeks in Treatment: 7 Visit Information History Since Last Visit All ordered tests and consults were completed: Yes Patient Arrived: Wheel Chair Added or deleted any medications: No Arrival Time: 08:20 Any new allergies or adverse reactions: No Accompanied By: None Had [name] fall or experienced change in No Transfer Assistance: EasyPivot Patient Lift activities of daily living that may affect Patient Identification Verified: Yes risk of falls: Secondary Verification Process Completed: Yes Signs or symptoms of abuse/neglect since last visito No Patient Requires Transmission-Based Precautions: No Hospitalized since last visit: No Patient Has Alerts: No Implantable device outside of the clinic excluding No cellular tissue based products placed in the center since last visit: Pain Present Now: No Electronic Signature(s) Signed: 06/08/2022 1:19:55 PM By: Valeria Batman EMT Entered By: Valeria Batman on 06/08/2022 13:19:55 -------------------------------------------------------------------------------- Encounter Discharge Information Details Patient Name: Date of Service: [name], [name] 06/08/2022 8:00 [name] Medical Record Number: [id number] Patient Account Number: [id number] Date of Birth/Sex: Treating RN: 09-13-1955 (66 y.o. [name] Primary Care Porsha Skilton: Eino Farber, DA RREN Other Clinician: Valeria Batman Referring Darleen Moffitt: Treating Jarek Longton/Extender: [name] BA  TES, DA RREN Weeks in Treatment: 7 Encounter Discharge Information Items Discharge Condition: Stable Ambulatory Status: Wheelchair Discharge Destination: Home Transportation: Private Auto Accompanied By: None Schedule Follow-up Appointment: Yes Clinical Summary of Care: Electronic Signature(s) Signed: 06/08/2022 2:13:33 PM By: Valeria Batman EMT Entered By: Valeria Batman on 06/08/2022 14:13:33 [name] ([id number]) [id number] Page 2 of 2 -------------------------------------------------------------------------------- Vitals Details Patient Name: Date of Service: [name], [name] 06/08/2022 8:00 [name] Medical Record Number: [id number] Patient Account Number: [id number] Date of Birth/Sex: Treating RN: 1955-10-12 (66 y.o. [name], [name] Primary Care Jackalyn Haith: Eino Farber, DA RREN Other Clinician: Valeria Batman Referring Karlee Staff: Treating Amanpreet Delmont/Extender: [name] BA TES, DA RREN Weeks in Treatment: 7 Vital Signs Time Taken: 08:39 Temperature (F): 98.2 Height (in): 72 Pulse (bpm): 77 Weight (lbs): 220 Respiratory Rate (breaths/min): 18 Body Mass Index (BMI): 29.8 Blood Pressure (mmHg): 120/65 Capillary Blood Glucose (mg/dl): 272 Reference Range: 80 - 120 mg / dl Electronic Signature(s) Signed: 06/08/2022 1:20:36 PM By: Valeria Batman EMT Entered By: Valeria Batman on 06/08/2022 13:20:36

## 2022-06-08 NOTE — Progress Notes (Addendum)
SEWELL, LLERENAS A ([id number]) [id number] Page 1 of 2 Visit Report for 06/08/2022 Problem List Details Patient Name: Date of Service: [name], [name] 06/08/2022 8:00 A M Medical Record Number: [id number] Patient Account Number: [id number] Date of Birth/Sex: Treating RN: September 05, 1955 (66 y.o. 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 06/08/2022 Medical Record Number: [id number] Patient Account Number: [id number] Date of Birth/Sex: Treating RN: 1956-04-30 (66 y.o. 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.42 Type 2 diabetes mellitus with diabetic polyneuropathy Facility Procedures : CPT4 Code Description: [id number]-(Facility Use Only) HBOT full body chamber, 48mn , ICD-10 Diagnosis Description E11.621 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.522 Non-pressure chronic ulcer of other part of left foot with f L97.528  Non-pressure chronic ulcer of other part of left foot with o E11.42 Type 2 diabetes mellitus with diabetic polyneuropathy Modifier: at layer exposed ther specified se Quantity: 1 verity Physician Procedures : CPT4 Code Description Modifier [id number] PHYS HYPERBARIC OXYGEN THERAPY ICD-10 Diagnosis Description E11.621 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.528  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

## 2022-06-09 ENCOUNTER — Encounter (HOSPITAL_BASED_OUTPATIENT_CLINIC_OR_DEPARTMENT_OTHER): Payer: Medicare HMO | Admitting: Internal Medicine

## 2022-06-10 ENCOUNTER — Encounter (HOSPITAL_BASED_OUTPATIENT_CLINIC_OR_DEPARTMENT_OTHER): Payer: Medicare HMO | Admitting: General Surgery

## 2022-06-10 DIAGNOSIS — E11621 Type 2 diabetes mellitus with foot ulcer: Secondary | ICD-10-CM | POA: Diagnosis not present

## 2022-06-10 LAB — GLUCOSE, CAPILLARY
Glucose-Capillary: 320 mg/dL — ABNORMAL HIGH (ref 70–99)
Glucose-Capillary: 272 mg/dL — ABNORMAL HIGH (ref 70–99)

## 2022-06-10 NOTE — Progress Notes (Signed)
[name], WAYMIRE [name] ([id number]) [id number].pdf Page 1 of 2 Visit Report for 06/10/2022 Arrival Information Details Patient Name: Date of Service: [name], [name] 06/10/2022 8:00 [name] Medical Record Number: [id number] Patient Account Number: [id number] Date of Birth/Sex: Treating RN: 05-29-1956 (66 y.o. [name] Primary Care Nour Rodrigues: Eino Farber, DA RREN Other Clinician: Valeria Batman Referring Zhania Shaheen: Treating Nayanna Seaborn/Extender: Fredirick Maudlin BA TES, DA RREN Weeks in Treatment: 8 Visit Information History Since Last Visit All ordered tests and consults were completed: Yes Patient Arrived: Wheel Chair Added or deleted any medications: No Arrival Time: 07:56 Any new allergies or adverse reactions: No Accompanied By: None Had [name] fall or experienced change in No Transfer Assistance: EasyPivot Patient Lift activities of daily living that may affect Patient Identification Verified: Yes risk of falls: Secondary Verification Process Completed: Yes Signs or symptoms of abuse/neglect since last visito No Patient Requires Transmission-Based Precautions: No Hospitalized since last visit: No Patient Has Alerts: No Implantable device outside of the clinic excluding No cellular tissue based products placed in the center since last visit: Pain Present Now: No Electronic Signature(s) Signed: 06/10/2022 1:59:17 PM By: Valeria Batman EMT Entered By: Valeria Batman on 06/10/2022 13:59:17 -------------------------------------------------------------------------------- Encounter Discharge Information Details Patient Name: Date of Service: [name], [name] 06/10/2022 8:00 [name] Medical Record Number: [id number] Patient Account Number: [id number] Date of Birth/Sex: Treating RN: 08/06/1955 (66 y.o. [name] Primary Care Doctor Sheahan: Eino Farber, DA RREN Other Clinician: Valeria Batman Referring Danaysha Kirn: Treating Sayaka Hoeppner/Extender: Fredirick Maudlin BA TES, DA  RREN Weeks in Treatment: 8 Encounter Discharge Information Items Discharge Condition: Stable Ambulatory Status: Wheelchair Discharge Destination: Home Transportation: Private Auto Accompanied By: None Schedule Follow-up Appointment: Yes Clinical Summary of Care: Electronic Signature(s) Signed: 06/10/2022 2:11:43 PM By: Valeria Batman EMT Entered By: Valeria Batman on 06/10/2022 14:11:43 [name] ([id number]) [id number] Page 2 of 2 -------------------------------------------------------------------------------- Vitals Details Patient Name: Date of Service: [name], [name] 06/10/2022 8:00 [name] Medical Record Number: [id number] Patient Account Number: [id number] Date of Birth/Sex: Treating RN: 1955-07-15 (66 y.o. [name] Primary Care Damika Harmon: Eino Farber, DA RREN Other Clinician: Valeria Batman Referring Belal Scallon: Treating Lillianna Sabel/Extender: Fredirick Maudlin BA TES, DA RREN Weeks in Treatment: 8 Vital Signs Time Taken: 08:18 Temperature (F): 98.1 Height (in): 72 Pulse (bpm): 92 Weight (lbs): 220 Respiratory Rate (breaths/min): 18 Body Mass Index (BMI): 29.8 Blood Pressure (mmHg): 137/69 Capillary Blood Glucose (mg/dl): 320 Reference Range: 80 - 120 mg / dl Electronic Signature(s) Signed: 06/10/2022 2:00:32 PM By: Valeria Batman EMT Entered By: Valeria Batman on 06/10/2022 14:00:31

## 2022-06-11 ENCOUNTER — Encounter (HOSPITAL_BASED_OUTPATIENT_CLINIC_OR_DEPARTMENT_OTHER): Payer: Medicare HMO | Admitting: Internal Medicine

## 2022-06-11 ENCOUNTER — Encounter (HOSPITAL_BASED_OUTPATIENT_CLINIC_OR_DEPARTMENT_OTHER): Payer: Medicare HMO | Admitting: General Surgery

## 2022-06-11 DIAGNOSIS — E11621 Type 2 diabetes mellitus with foot ulcer: Secondary | ICD-10-CM | POA: Diagnosis not present

## 2022-06-11 DIAGNOSIS — L97522 Non-pressure chronic ulcer of other part of left foot with fat layer exposed: Secondary | ICD-10-CM | POA: Diagnosis not present

## 2022-06-11 LAB — GLUCOSE, CAPILLARY
Glucose-Capillary: 181 mg/dL — ABNORMAL HIGH (ref 70–99)
Glucose-Capillary: 108 mg/dL — ABNORMAL HIGH (ref 70–99)

## 2022-06-11 NOTE — Progress Notes (Signed)
MACKEY, VARRICCHIO A ([id number]) [id number] Page 1 of 2 Visit Report for 06/10/2022 HBO Details Patient Name: Date of Service: [name], [name] 06/10/2022 8:00 A M Medical Record Number: [id number] Patient Account Number: [id number] Date of Birth/Sex: Treating RN: 1955-07-24 (66 y.o. [name] Primary Care Perle Brickhouse: Eino Farber, DA RREN Other Clinician: Valeria Batman Referring Fransheska Willingham: Treating Mikaiya Tramble/Extender: Fredirick Maudlin BA TES, DA RREN Weeks in Treatment: 8 HBO Treatment Course Details Treatment Course Number: 1 Ordering Hasaan Radde: [name] T Treatments Ordered: otal 40 HBO Treatment Start Date: 06/01/2022 HBO Indication: Diabetic Ulcer(s) of the Lower Extremity Standard/Conservative Wound Care tried and failed greater than or equal to 30 days Wound #4 Left, Dorsal Foot , Wound #5 Left, Lateral Foot HBO Treatment Details Treatment Number: 7 Patient Type: Outpatient Chamber Type: Monoplace Chamber Serial #: [id number] Treatment Protocol: 2.5 ATA with 90 minutes oxygen, with two 5 minute air breaks Treatment Details Compression Rate Down: 1.5 psi / minute De-Compression Rate Up: 1.5 psi / minute A breaks and breathing ir Compress Tx Pressure periods Decompress Decompress Begins Reached (leave unused spaces Begins Ends blank) Chamber Pressure (ATA 1 2.5 2.5 2.5 2.5 2.5 - - 2.5 1 ) Clock Time (24 hr) 08:34 08:49 09:19 09:25 09:55 10:00 - - 10:30 10:44 Treatment Length: 130 (minutes) Treatment Segments: 4 Vital Signs Capillary Blood Glucose Reference Range: 80 - 120 mg / dl HBO Diabetic Blood Glucose Intervention Range: <131 mg/dl or >249 mg/dl Type: Time Vitals Blood Pulse: Respiratory Temperature: Capillary Blood Glucose Pulse Action Taken: Pressure: Rate: Glucose (mg/dl): Meter #: Oximetry (%) Taken: Pre 08:18 137/69 92 18 98.1 320 Stated that he forgot to take his Novolog 70/30 Post 10:50 158/93 80 18 97.7 272 Stated that he  forgot to take his Novolog 70/30 Treatment Response Treatment Toleration: Well Treatment Completion Status: Treatment Completed without Adverse Event Treatment Notes Stated that he forgot to take his Novolog 70/30. Dr. Celine [name] informed. The patient was seen by ENT and cleared to return for HBO treatments. Additional Procedure Documentation Tissue Sevierity: Necrosis of bone Physician HBO Attestation: I certify that I supervised this HBO treatment in accordance with Medicare guidelines. A trained emergency response team is readily available per Yes hospital policies and procedures. Continue HBOT as ordered. Yes Electronic Signature(s) Signed: 06/10/2022 5:04:19 PM By: Fredirick Maudlin MD FACS Previous Signature: 06/10/2022 2:09:56 PM Version By: Valeria Batman EMT Entered By: Fredirick Maudlin on 06/10/2022 17:04:19 [name] ([id number]) [id number] Page 2 of 2 -------------------------------------------------------------------------------- HBO Safety Checklist Details Patient Name: Date of Service: [name], [name] 06/10/2022 8:00 A M Medical Record Number: [id number] Patient Account Number: [id number] Date of Birth/Sex: Treating RN: Mar 19, 1956 (66 y.o. [name], [name] Primary Care Broderick Fonseca: Eino Farber, DA RREN Other Clinician: Valeria Batman Referring Darey Hershberger: Treating Aricka Goldberger/Extender: Fredirick Maudlin BA TES, DA RREN Weeks in Treatment: 8 HBO Safety Checklist Items Safety Checklist Consent Form Signed Patient voided / foley secured and emptied When did you last eato 0700 Yesterday Stated that he forgot to take his Last dose of injectable or oral agent Novolog 70/30 Ostomy pouch emptied and vented if applicable NA All implantable devices assessed, documented and approved NA Intravenous access site secured and place NA Valuables secured Linens and cotton and cotton/polyester blend (less than 51% polyester) Personal oil-based products / skin  lotions / body lotions removed Wigs or hairpieces removed NA Smoking or tobacco materials removed Books / newspapers / magazines / loose paper removed Cologne, aftershave, perfume and deodorant  removed Jewelry removed (may wrap wedding band) Make-up removed NA Hair care products removed Battery operated devices (external) removed Heating patches and chemical warmers removed Titanium eyewear removed NA Nail polish cured greater than 10 hours NA Casting material cured greater than 10 hours NA Hearing aids removed NA Loose dentures or partials removed removed by patient Prosthetics have been removed NA Patient demonstrates correct use of air break device (if applicable) Patient concerns have been addressed Patient grounding bracelet on and cord attached to chamber Specifics for Inpatients (complete in addition to above) Medication sheet sent with patient NA Intravenous medications needed or due during therapy sent with patient NA Drainage tubes (e.g. nasogastric tube or chest tube secured and vented) NA Endotracheal or Tracheotomy tube secured NA Cuff deflated of air and inflated with saline NA Airway suctioned NA Notes The safety check was done before the treatment was started. The patient stated that he had a beef sausage on a bun, juice and for breakfast. Stated that he forgot to take his Novolog 70/30. Electronic Signature(s) Signed: 06/10/2022 2:07:06 PM By: Valeria Batman EMT Previous Signature: 06/10/2022 2:04:53 PM Version By: Valeria Batman EMT Entered By: Valeria Batman on 06/10/2022 14:07:06

## 2022-06-11 NOTE — Progress Notes (Signed)
ROLLAND, STEINERT A ([id number]) [id number] Page 1 of 2 Visit Report for 06/11/2022 Arrival Information Details Patient Name: Date of Service: [name], [name] 06/11/2022 8:00 A M Medical Record Number: [id number] Patient Account Number: [id number] Date of Birth/Sex: Treating RN: 1956-04-18 (66 y.o. [name] Primary Care Trase Bunda: Eino Farber, DA RREN Other Clinician: Valeria Batman Referring Joshu Furukawa: Treating Aunna Snooks/Extender: Fredirick Maudlin BA TES, DA RREN Weeks in Treatment: 8 Visit Information History Since Last Visit All ordered tests and consults were completed: Yes Patient Arrived: Wheel Chair Added or deleted any medications: No Arrival Time: 07:33 Any new allergies or adverse reactions: No Accompanied By: Wife Had a fall or experienced change in No Transfer Assistance: None activities of daily living that may affect Patient Identification Verified: Yes risk of falls: Secondary Verification Process Completed: Yes Signs or symptoms of abuse/neglect since last visito No Patient Requires Transmission-Based Precautions: No Hospitalized since last visit: No Patient Has Alerts: No Implantable device outside of the clinic excluding No cellular tissue based products placed in the center since last visit: Pain Present Now: No Electronic Signature(s) Signed: 06/11/2022 12:02:22 PM By: Valeria Batman EMT Entered By: Valeria Batman on 06/11/2022 12:02:21 -------------------------------------------------------------------------------- Encounter Discharge Information Details Patient Name: Date of Service: [name], [name] 06/11/2022 8:00 A M Medical Record Number: [id number] Patient Account Number: [id number] Date of Birth/Sex: Treating RN: 1956/01/23 (66 y.o. [name] Primary Care Eller Sweis: Eino Farber, DA RREN Other Clinician: Valeria Batman Referring Zyire Eidson: Treating Cristine Daw/Extender: Fredirick Maudlin BA TES, DA RREN Weeks in  Treatment: 8 Encounter Discharge Information Items Discharge Condition: Stable Ambulatory Status: Wheelchair Discharge Destination: Other (Note Required) Transportation: Private Auto Accompanied By: Wife Schedule Follow-up Appointment: Yes Clinical Summary of Care: Notes The patient had a [facility] visit after hie treatment today. Electronic Signature(s) Signed: 06/11/2022 12:08:42 PM By: Valeria Batman EMT Entered By: Valeria Batman on 06/11/2022 12:08:42 [name] ([id number]) [id number] Page 2 of 2 -------------------------------------------------------------------------------- Vitals Details Patient Name: Date of Service: [name], [name] 06/11/2022 8:00 A M Medical Record Number: [id number] Patient Account Number: [id number] Date of Birth/Sex: Treating RN: 1955-10-23 (66 y.o. [name] Primary Care Lamari Youngers: Eino Farber, DA RREN Other Clinician: Valeria Batman Referring Ayodele Hartsock: Treating Laniece Hornbaker/Extender: Fredirick Maudlin BA TES, DA RREN Weeks in Treatment: 8 Vital Signs Time Taken: 07:59 Temperature (F): 98.2 Height (in): 72 Pulse (bpm): 91 Weight (lbs): 220 Respiratory Rate (breaths/min): 18 Body Mass Index (BMI): 29.8 Blood Pressure (mmHg): 121/74 Capillary Blood Glucose (mg/dl): 181 Reference Range: 80 - 120 mg / dl Electronic Signature(s) Signed: 06/11/2022 12:02:48 PM By: Valeria Batman EMT Entered By: Valeria Batman on 06/11/2022 12:02:48

## 2022-06-11 NOTE — Progress Notes (Signed)
MYQUAN, SCHAUMBURG A ([id number]) [id number] Page 1 of 2 Visit Report for 06/11/2022 HBO Details Patient Name: Date of Service: [name], [name] 06/11/2022 8:00 A M Medical Record Number: [id number] Patient Account Number: [id number] Date of Birth/Sex: Treating RN: 1956-03-20 (66 y.o. [name] Primary Care Leeyah Heather: Eino Farber, DA RREN Other Clinician: Valeria Batman Referring Altan Kraai: Treating Adith Tejada/Extender: Fredirick Maudlin BA TES, DA RREN Weeks in Treatment: 8 HBO Treatment Course Details Treatment Course Number: 1 Ordering Ronee Ranganathan: [name] [name] Treatments Ordered: otal 40 HBO Treatment Start Date: 06/01/2022 HBO Indication: Diabetic Ulcer(s) of the Lower Extremity Standard/Conservative Wound Care tried and failed greater than or equal to 30 days Wound #4 Left, Dorsal Foot , Wound #5 Left, Lateral Foot HBO Treatment Details Treatment Number: 8 Patient Type: Outpatient Chamber Type: Monoplace Chamber Serial #: [id number] Treatment Protocol: 2.5 ATA with 90 minutes oxygen, with two 5 minute air breaks Treatment Details Compression Rate Down: 1.5 psi / minute De-Compression Rate Up: 2.0 psi / minute A breaks and breathing ir Compress Tx Pressure periods Decompress Decompress Begins Reached (leave unused spaces Begins Ends blank) Chamber Pressure (ATA 1 2.5 2.5 2.5 2.5 2.5 - - 2.5 1 ) Clock Time (24 hr) 08:03 08:20 08:50 08:55 08:25 08:30 - - 10:00 10:12 Treatment Length: 129 (minutes) Treatment Segments: 4 Vital Signs Capillary Blood Glucose Reference Range: 80 - 120 mg / dl HBO Diabetic Blood Glucose Intervention Range: <131 mg/dl or >249 mg/dl Type: Time Vitals Blood Pulse: Respiratory Temperature: Capillary Blood Glucose Pulse Action Taken: Pressure: Rate: Glucose (mg/dl): Meter #: Oximetry (%) Taken: Pre 07:59 121/74 91 18 98.2 181 Post 10:22 134/74 73 18 97.5 108 Patient given Glucerna. Waited for 75mn after. Treatment  Response Treatment Toleration: Well Treatment Completion Status: Treatment Completed without Adverse Event Additional Procedure Documentation Tissue Sevierity: Necrosis of bone Physician HBO Attestation: I certify that I supervised this HBO treatment in accordance with Medicare guidelines. A trained emergency response team is readily available per Yes hospital policies and procedures. Continue HBOT as ordered. Yes Electronic Signature(s) Signed: 06/11/2022 12:16:13 PM By: CFredirick MaudlinMD FACS Previous Signature: 06/11/2022 12:06:44 PM Version By: GValeria BatmanEMT Entered By: CFredirick Maudlinon 06/11/2022 12:16:13 TPeri JeffersonA ([id number]) [id number] Page 2 of 2 -------------------------------------------------------------------------------- HBO Safety Checklist Details Patient Name: Date of Service: [name] 06/11/2022 8:00 A M Medical Record Number: [id number] Account Number: [id number] of Birth/Sex: Treating RN: 1July 12, 1957(66y.o. MCollene GobblePrimary Care Orel Hord: BEino Farber DA RREN Other Clinician: GValeria BatmanReferring Garek Schuneman: Treating Leaner Morici/Extender: CFredirick MaudlinBA TES, DA RREN Weeks in Treatment: 8 HBO Safety Checklist Items Safety Checklist Consent Form Signed Patient voided / foley secured and emptied When did you last eato 0645 Last dose of injectable or oral agent 0600 Ostomy pouch emptied and vented if applicable NA All implantable devices assessed, documented and approved NA Intravenous access site secured and place NA Valuables secured Linens and cotton and cotton/polyester blend (less than 51% polyester) Personal oil-based products / skin lotions / body lotions removed Wigs or hairpieces removed NA Smoking or tobacco materials removed Books / newspapers / magazines / loose paper removed Cologne, aftershave, perfume and deodorant removed Jewelry removed (may wrap wedding  band) Make-up removed NA Hair care products removed Battery operated devices (external) removed Heating patches and chemical warmers removed Titanium eyewear removed NA Nail polish cured greater than 10 hours NA Casting material cured greater than 10 hours NA Hearing aids  removed NA Loose dentures or partials removed removed by patient Prosthetics have been removed NA Patient demonstrates correct use of air break device (if applicable) Patient concerns have been addressed Patient grounding bracelet on and cord attached to chamber Specifics for Inpatients (complete in addition to above) Medication sheet sent with patient NA Intravenous medications needed or due during therapy sent with patient NA Drainage tubes (e.g. nasogastric tube or chest tube secured and vented) NA Endotracheal or Tracheotomy tube secured NA Cuff deflated of air and inflated with saline NA Airway suctioned NA Notes The safety check was done before the treatment was started. The patient stated that he had a beef sausage on a bun, juice and for breakfast. Electronic Signature(s) Signed: 06/11/2022 12:04:53 PM By: Valeria Batman EMT Entered By: Valeria Batman on 06/11/2022 12:04:53

## 2022-06-11 NOTE — Progress Notes (Signed)
[name], TAGG A ([id number]) [id number] Page 1 of 2 Visit Report for 06/11/2022 Problem List Details Patient Name: Date of Service: [name], [name] 06/11/2022 8:00 A M Medical Record Number: [id number] Patient Account Number: [id number] Date of Birth/Sex: Treating RN: Feb 07, 1956 (66 y.o. 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 06/11/2022 Medical Record Number: [id number] Patient Account Number: [id number] Date of Birth/Sex: Treating RN: 01/17/1956 (66 y.o. 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 Facility Procedures : CPT4 Code Description: [id number] G0277-(Facility Use Only) HBOT full body chamber, 81mn , ICD-10 Diagnosis Description E11.621 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.522 Non-pressure chronic ulcer of other part of left foot with f L97.528  Non-pressure chronic ulcer of other part of left foot with o E11.42 Type 2 diabetes mellitus with diabetic polyneuropathy Modifier: at layer exposed ther specified se Quantity: 4 verity Physician Procedures : CPT4 Code Description Modifier [id number]- WC PHYS HYPERBARIC OXYGEN THERAPY ICD-10 Diagnosis Description E11.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

## 2022-06-11 NOTE — Progress Notes (Signed)
SEHAJ, MCENROE A ([id number]) [id number] Page 1 of 2 Visit Report for 06/10/2022 Problem List Details Patient Name: Date of Service: [name], [name] 06/10/2022 8:00 A M Medical Record Number: [id number] Patient Account Number: [id number] Date of Birth/Sex: Treating RN: 05-15-1956 (66 y.o. 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 06/10/2022 Medical Record Number: [id number] Patient Account Number: [id number] Date of Birth/Sex: Treating RN: 1955/10/07 (66 y.o. 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.42 Type 2 diabetes mellitus with diabetic polyneuropathy Facility Procedures : CPT4 Code Description: [id number] G0277-(Facility Use Only) HBOT full body chamber, 15mn , ICD-10 Diagnosis Description E11.621 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.522 Non-pressure chronic ulcer of other part of left foot with f L97.528  Non-pressure chronic ulcer of other part of left foot with o Modifier: at layer exposed ther specified se Quantity: 4 verity Physician Procedures : CPT4 Code Description Modifier [id number]- WC PHYS HYPERBARIC OXYGEN THERAPY ICD-10 Diagnosis Description E11.621 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

## 2022-06-12 ENCOUNTER — Encounter (HOSPITAL_BASED_OUTPATIENT_CLINIC_OR_DEPARTMENT_OTHER): Payer: Medicare HMO | Admitting: Internal Medicine

## 2022-06-12 DIAGNOSIS — E11621 Type 2 diabetes mellitus with foot ulcer: Secondary | ICD-10-CM | POA: Diagnosis not present

## 2022-06-12 DIAGNOSIS — L97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 specified severity: Secondary | ICD-10-CM | POA: Diagnosis not present

## 2022-06-12 DIAGNOSIS — L97522 Non-pressure chronic ulcer of other part of left foot with fat layer exposed: Secondary | ICD-10-CM | POA: Diagnosis not present

## 2022-06-12 LAB — GLUCOSE, CAPILLARY
Glucose-Capillary: 128 mg/dL — ABNORMAL HIGH (ref 70–99)
Glucose-Capillary: 158 mg/dL — ABNORMAL HIGH (ref 70–99)
Glucose-Capillary: 118 mg/dL — ABNORMAL HIGH (ref 70–99)

## 2022-06-13 NOTE — Progress Notes (Signed)
[name] ([id number]) [id number] Page 1 of 2 Visit Report for 06/12/2022 Arrival Information Details Patient Name: Date of Service: [name], [name] 06/12/2022 8:00 [name] Medical Record Number: [id number] Patient Account Number: [id number] Date of Birth/Sex: Treating RN: Mar 25, 1956 (66 y.o. [name] Primary Care [name]: [name], [name] Other Clinician: [name] Referring [name]: Treating [name]/Extender: [name] Weeks in Treatment: 8 Visit Information History Since Last Visit All ordered tests and consults were completed: Yes Patient Arrived: Wheel Chair Added or deleted any medications: No Arrival Time: 08:20 Any new allergies or adverse reactions: No Accompanied By: None Had [name] fall or experienced change in No Transfer Assistance: EasyPivot Patient Lift activities of daily living that may affect Patient Identification Verified: Yes risk of falls: Secondary Verification Process Completed: Yes Signs or symptoms of abuse/neglect since last visito No Patient Requires Transmission-Based Precautions: No Hospitalized since last visit: No Patient Has Alerts: No Implantable device outside of the clinic excluding No cellular tissue based products placed in the center since last visit: Pain Present Now: No Electronic Signature(s) Signed: 06/12/2022 3:22:35 PM By: [name] EMT Entered By: [name] on 06/12/2022 15:22:35 -------------------------------------------------------------------------------- Encounter Discharge Information Details Patient Name: Date of Service: [name], [name] 06/12/2022 8:00 [name] Medical Record Number: [id number] Patient Account Number: [id number] Date of Birth/Sex: Treating RN: 03/25/1956 (66 y.o. [name] Primary Care [name]: [name], [name] Other Clinician: [name] Referring [name]: Treating [name]/Extender: [name] [name] TES, [name]  [name] Weeks in Treatment: 8 Encounter Discharge Information Items Discharge Condition: Stable Ambulatory Status: Wheelchair Discharge Destination: Home Transportation: Private Auto Accompanied By: None Schedule Follow-up Appointment: Yes Clinical Summary of Care: Electronic Signature(s) Signed: 06/12/2022 3:43:01 PM By: [name] EMT Entered By: [name] on 06/12/2022 15:43:01 [name] ([id number]) [id number] Page 2 of 2 -------------------------------------------------------------------------------- Vitals Details Patient Name: Date of Service: [name], [name] 06/12/2022 8:00 [name] Medical Record Number: [id number] Patient Account Number: [id number] Date of Birth/Sex: Treating RN: 1956-01-21 (66 y.o. [name] Primary Care Mancil Pfenning: [name], [name] Other Clinician: [name] Referring Samoria Fedorko: Treating [name]: [name] Weeks in Treatment: 8 Vital Signs Time Taken: 08:34 Capillary Blood Glucose (mg/dl): 128 Height (in): 72 Reference Range: 80 - 120 mg / dl Weight (lbs): 220 Body Mass Index (BMI): 29.8 Electronic Signature(s) Signed: 06/12/2022 3:23:04 PM By: [name] EMT Entered By: [name] on 06/12/2022 15:23:04

## 2022-06-13 NOTE — Progress Notes (Signed)
[name], [name] [name] ([id number]) [id number] Page 1 of 2 Visit Report for 06/12/2022 HBO Details Patient Name: Date of Service: [name], [name] 06/12/2022 8:00 [name] Medical Record Number: [id number] Patient Account Number: [id number] Date of Birth/Sex: Treating RN: 13-Sep-1955 (66 y.o. [name], [name] Primary Care [name]: [name] Other Clinician: [name] Referring [name]: Treating [name]/Extender: [name] in Treatment: 8 HBO Treatment Course Details Treatment Course Number: 1 Ordering [name]: [name] T Treatments Ordered: otal 40 HBO Treatment Start Date: 06/01/2022 HBO Indication: Diabetic Ulcer(s) of the Lower Extremity Standard/Conservative Wound Care tried and failed greater than or equal to 30 days Wound #4 Left, Dorsal Foot , Wound #5 Left, Lateral Foot HBO Treatment Details Treatment Number: 9 Patient Type: Outpatient Chamber Type: Monoplace Chamber Serial #: [id number] Treatment Protocol: 2.5 ATA with 90 minutes oxygen, with two 5 minute air breaks Treatment Details Compression Rate Down: 1.0 psi / minute De-Compression Rate Up: 1.0 psi / minute [name] breaks and ir breathing Compress Tx Pressure Decompress Decompress periods Begins Reached Begins Ends (leave unused spaces blank) Chamber Pressure (ATA 1 2.5 2.5 2.5 2.5 2.5 - - 2.5 1 ) Clock Time (24 hr) 09:24 - - - - - --- 10:10 Treatment Length: 46 (minutes) Treatment Segments: 2 Vital Signs Capillary Blood Glucose Reference Range: 80 - 120 mg / dl HBO Diabetic Blood Glucose Intervention Range: <131 mg/dl or >249 mg/dl Type: Time Vitals Blood Pulse: Respiratory Capillary Blood Glucose Pulse Action Temperature: Taken: Pressure: Rate: Glucose (mg/dl): Meter #: Oximetry (%) Taken: Pre 08:34 128 Patient given 8 oz glucerna Post 10:21 159/75 92 18 97.7 118 Pre 09:10 118/79 92 18 98.2 158 Treatment Response Treatment Toleration:  Well Treatment Completion Status: Treatment Aborted/Not Restarted Reason: [name] Choice Treatment Notes The patient was unable to clear his ears. Travel was set at 1.0 PSI/min with multiple stops travel attempts. Chamber pressure never went over 8PSI. Dr. Celine [name] called to chamber side. Dr. Celine [name] informed of the problem and stated to abort treatment. The patient was having problems clearing coming out, had to stop travel multiple times. Dr. Celine [name] looked at the patient ears and spoke with the patient, after removal from chamber. Additional Procedure Documentation Tissue Sevierity: Necrosis of bone Physician HBO Attestation: I certify that I supervised this HBO treatment in accordance with Medicare guidelines. [name] trained emergency response team is readily available per Yes hospital policies and procedures. Continue HBOT as ordered. [name] ([id number]) [id number] Page 2 of 2 Electronic Signature(s) Signed: 06/15/2022 3:53:46 PM By: [name] Shan DO Previous Signature: 06/12/2022 3:39:58 PM Version By: [name] EMT Previous Signature: 06/12/2022 3:38:22 PM Version By: [name] EMT Previous Signature: 06/12/2022 3:38:02 PM Version By: [name] EMT Entered By: [name] on 06/15/2022 15:50:19 -------------------------------------------------------------------------------- HBO Safety Checklist Details Patient Name: Date of Service: [name], [name] 06/12/2022 8:00 [name] Medical Record Number: [id number] Patient Account Number: [id number] Date of Birth/Sex: Treating RN: [birth year] (66 y.o. [name] Primary Care [name]: [name] Other Clinician: [name] Referring [name]: Treating [name]/Extender: [name] in Treatment: 8 HBO Safety Checklist Items Safety Checklist Consent Form Signed Patient voided / foley secured and emptied When did you last eato 0645 Last dose of  injectable or oral agent 0630 Ostomy pouch emptied and vented if applicable NA All implantable devices assessed, documented and approved NA Intravenous access site secured and place NA Valuables secured Linens and cotton and cotton/polyester blend (  less than 51% polyester) Personal oil-based products / skin lotions / body lotions removed Wigs or hairpieces removed NA Smoking or tobacco materials removed Books / newspapers / magazines / loose paper removed Cologne, aftershave, perfume and deodorant removed Jewelry removed (may wrap wedding band) Make-up removed NA Hair care products removed Battery operated devices (external) removed Heating patches and chemical warmers removed Titanium eyewear removed NA Nail polish cured greater than 10 hours NA Casting material cured greater than 10 hours NA Hearing aids removed NA Loose dentures or partials removed removed by patient Prosthetics have been removed NA Patient demonstrates correct use of air break device (if applicable) Patient concerns have been addressed Patient grounding bracelet on and cord attached to chamber Specifics for Inpatients (complete in addition to above) Medication sheet sent with patient NA Intravenous medications needed or due during therapy sent with patient NA Drainage tubes (e.g. nasogastric tube or chest tube secured and vented) NA Endotracheal or Tracheotomy tube secured NA Cuff deflated of air and inflated with saline NA Airway suctioned NA Notes The safety check was done before the treatment was started. Patient stated that he had [name] baloney with lettuce, Veg soup and juice before coming in today. Electronic Signature(s) Signed: 06/12/2022 3:26:52 PM By: [name] EMT Entered By: [name] on 06/12/2022 15:26:51

## 2022-06-15 ENCOUNTER — Encounter (HOSPITAL_BASED_OUTPATIENT_CLINIC_OR_DEPARTMENT_OTHER): Payer: Medicare HMO | Admitting: Internal Medicine

## 2022-06-15 DIAGNOSIS — E11621 Type 2 diabetes mellitus with foot ulcer: Secondary | ICD-10-CM | POA: Diagnosis not present

## 2022-06-15 LAB — GLUCOSE, CAPILLARY
Glucose-Capillary: 249 mg/dL — ABNORMAL HIGH (ref 70–99)
Glucose-Capillary: 157 mg/dL — ABNORMAL HIGH (ref 70–99)

## 2022-06-15 NOTE — Progress Notes (Signed)
[name], [name] ([id number]) [id number] Page 1 of 2 Visit Report for 06/12/2022 Problem List Details Patient Name: Date of Service: [name], [name] 06/12/2022 8:00 A M Medical Record Number: [id number] Patient Account Number: [id number] Date of Birth/Sex: Treating RN: 11-26-1955 (66 y.o. 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 06/12/2022 Medical Record Number: [id number] Patient Account Number: [id number] Date of Birth/Sex: Treating RN: 06-06-1956 (66 y.o. 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 specified severity E11.621 Type 2 diabetes mellitus with foot ulcer E11.42 Type 2 diabetes mellitus with diabetic polyneuropathy Facility Procedures : CPT4 Code Description: [id number] G0277-(Facility Use Only) HBOT full body chamber, 49mn , ICD-10 Diagnosis Description E11.621 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.522 Non-pressure chronic ulcer of other part of left foot with f L97.528  Non-pressure chronic ulcer of other part of left foot with o Modifier: at layer exposed ther specified se Quantity: 2 verity Physician Procedures : CPT4 Code Description Modifier [id number]- WC PHYS HYPERBARIC OXYGEN THERAPY ICD-10 Diagnosis Description E11.621 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

## 2022-06-16 ENCOUNTER — Encounter (HOSPITAL_BASED_OUTPATIENT_CLINIC_OR_DEPARTMENT_OTHER): Payer: Medicare HMO | Admitting: Internal Medicine

## 2022-06-16 DIAGNOSIS — E11621 Type 2 diabetes mellitus with foot ulcer: Secondary | ICD-10-CM | POA: Diagnosis not present

## 2022-06-16 LAB — GLUCOSE, CAPILLARY
Glucose-Capillary: 134 mg/dL — ABNORMAL HIGH (ref 70–99)
Glucose-Capillary: 194 mg/dL — ABNORMAL HIGH (ref 70–99)

## 2022-06-16 NOTE — Progress Notes (Addendum)
GREGOR, DERSHEM [name] ([id number]) [id number] Page 1 of 2 Visit Report for 06/15/2022 Problem List Details Patient Name: Date of Service: [name], [name] 06/15/2022 8:00 [name] Medical Record Number: [id number] Patient Account Number: [id number] Date of Birth/Sex: Treating RN: [birth year] (66 y.o. 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 M86.172 Other acute osteomyelitis, left ankle and foot 05/08/2022 No Yes L97.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 04/14/2022 No Yes specified severity E11.621 Type 2 diabetes mellitus with foot ulcer 04/14/2022 No Yes E11.42 Type 2 diabetes mellitus with diabetic polyneuropathy 04/14/2022 No Yes Inactive Problems Resolved Problems Electronic Signature(s) Signed: 06/22/2022 8:23:47 AM By: Valeria Batman EMT Signed: 06/24/2022 11:35:24 AM By: [name] DO Previous Signature: 06/15/2022 4:58:54 PM Version By: [name] RN, [name] Previous Signature: 06/15/2022 5:01:06 PM Version By: Linton Ham MD Entered By: Valeria Batman on 06/22/2022 08:23:47 -------------------------------------------------------------------------------- [place] Details Patient Name: Date of Service: [name], [name] 06/15/2022 Medical Record Number: [id number] Patient Account Number: [id number] Date of Birth/Sex: Treating RN: 1955-12-09 (66 y.o. 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 E11.621 Type 2 diabetes mellitus with foot ulcer E11.42 Type 2 diabetes mellitus with diabetic polyneuropathy Facility Procedures : CPT4 Code Description: [phone] G0277-(Facility Use Only) HBOT full body chamber, 76mn , ICD-10 Diagnosis Description L97.528 Non-pressure chronic ulcer of other part of left foot with o L97.522 Non-pressure chronic ulcer of other part of left foot with  f Modifier: ther specified se at layer exposed Quantity: 5 verity Physician Procedures : CPT4 Code Description Modifier [id number] 03546- WC PHYS HYPERBARIC OXYGEN THERAPY ICD-10 Diagnosis Description L97.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

## 2022-06-16 NOTE — Progress Notes (Addendum)
ELBY, BLACKWELDER A ([id number]) [id number] Page 1 of 2 Visit Report for 06/15/2022 HBO Details Patient Name: Date of Service: [name], [name] 06/15/2022 8:00 A M Medical Record Number: [id number] Patient Account Number: [id number] Date of Birth/Sex: Treating RN: 1956/03/05 (66 y.o. [name], [name] Primary Care Milee Qualls: [name] Other Clinician: Referring Keiry Kowal: Treating Aliesha Dolata/Extender: [name] in Treatment: 8 HBO Treatment Course Details Treatment Course Number: 1 Ordering Damico Partin: [name] T Treatments Ordered: otal 40 HBO Treatment Start Date: 06/01/2022 HBO Indication: Diabetic Ulcer(s) of the Lower Extremity Standard/Conservative Wound Care tried and failed greater than or equal to 30 days Wound #4 Left, Dorsal Foot , Wound #5 Left, Lateral Foot HBO Treatment Details Treatment Number: 10 Patient Type: Outpatient Chamber Type: Monoplace Chamber Serial #: [id number] Treatment Protocol: 2.5 ATA with 90 minutes oxygen, with two 5 minute air breaks Treatment Details Compression Rate Down: 1.0 psi / minute De-Compression Rate Up: 1.0 psi / minute A breaks and breathing ir Compress Tx Pressure periods Decompress Decompress Begins Reached (leave unused spaces Begins Ends blank) Chamber Pressure (ATA 1 2.5 2.5 2.5 2.5 2.5 - - 2.5 1 ) Clock Time (24 hr) 08:40 09:08 09:38 09:43 10:13 10:18 - - 10:48 11:06 Treatment Length: 146 (minutes) Treatment Segments: 5 Vital Signs Capillary Blood Glucose Reference Range: 80 - 120 mg / dl HBO Diabetic Blood Glucose Intervention Range: <131 mg/dl or >249 mg/dl Time Vitals Blood Respiratory Capillary Blood Glucose Pulse Action Type: Pulse: Temperature: Taken: Pressure: Rate: Glucose (mg/dl): Meter #: Oximetry (%) Taken: Pre 07:15 159/88 101 16 97.3 249 Post 11:08 156/81 70 16 98.1 157 Treatment Response Treatment Toleration: Well Treatment Completion Status:  Treatment Completed without Adverse Event Treatment Notes The beginning of travel patient difficulty clearing ears stopped travel for two minutes, patient able to clear proceed with treatment. Additional Procedure Documentation Tissue Sevierity: Bone involvement without necrosis Brittini Brubeck Notes The patient returned from ENT having cerumen removed from his bilateral ears. Inspection of the tympanic membrane revealed normal tympanic membrane. He apparently has not tolerated the last 2 dives however we did manage to get him through today with adjustments to his dissent and Afrin. Will have a look at his ears tomorrow. Otherwise no issues Physician HBO Attestation: I certify that I supervised this HBO treatment in accordance with Medicare guidelines. A trained emergency response team is readily available per Yes hospital policies and procedures. Continue HBOT as ordered. Yes Electronic Signature(s) DSHAUN, REPPUCCI A ([id number]) [id number] Page 2 of 2 Unsigned Previous Signature: 06/15/2022 5:01:06 PM Version By: Linton Ham MD Previous Signature: 06/15/2022 4:58:54 PM Version By: [name] RN, [name] Entered By: Valeria Batman on 06/22/2022 08:23:07 -------------------------------------------------------------------------------- HBO Safety Checklist Details Patient Name: Date of Service: [name], [name] 06/15/2022 8:00 A M Medical Record Number: [id number] Patient Account Number: [id number] Date of Birth/Sex: Treating RN: [birth year] (65 y.o. [name], [name] Primary Care Jarrid Lienhard: [name] Other Clinician: Referring Christina Waldrop: Treating Shemiah Rosch/Extender: [name], [name] in Treatment: 8 HBO Safety Checklist Items Safety Checklist Consent Form Signed Patient voided / foley secured and emptied When did you last eato 0645 cereal, steak, eggs, juice Last dose of injectable or oral agent 0645 insulin Ostomy pouch emptied and vented  if applicable NA All implantable devices assessed, documented and approved NA Intravenous access site secured and place NA Valuables secured Linens and cotton and cotton/polyester blend (less than 51% polyester) Personal oil-based products / skin lotions / body lotions removed  Wigs or hairpieces removed NA Smoking or tobacco materials removed NA Books / newspapers / magazines / loose paper removed Cologne, aftershave, perfume and deodorant removed Jewelry removed (may wrap wedding band) Make-up removed NA Hair care products removed NA Battery operated devices (external) removed NA Heating patches and chemical warmers removed NA Titanium eyewear removed NA Nail polish cured greater than 10 hours NA Casting material cured greater than 10 hours NA Hearing aids removed NA Loose dentures or partials removed Prosthetics have been removed NA Patient demonstrates correct use of air break device (if applicable) Patient concerns have been addressed Patient grounding bracelet on and cord attached to chamber Specifics for Inpatients (complete in addition to above) Medication sheet sent with patient Intravenous medications needed or due during therapy sent with patient Drainage tubes (e.g. nasogastric tube or chest tube secured and vented) Endotracheal or Tracheotomy tube secured Cuff deflated of air and inflated with saline Airway suctioned Electronic Signature(s) Signed: 06/22/2022 8:22:50 AM By: Valeria Batman EMT Previous Signature: 06/15/2022 4:58:54 PM Version By: [name] RN, [name] Entered By: Valeria Batman on 06/22/2022 08:22:50

## 2022-06-16 NOTE — Progress Notes (Addendum)
[name], [name] ([id number]) [id number] Page 1 of 2 Visit Report for 06/15/2022 Arrival Information Details Patient Name: Date of Service: [name], [name] 06/15/2022 8:00 [name] Medical Record Number: [id number] Patient Account Number: [id number] Date of Birth/Sex: Treating RN: 09-19-1955 (66 y.o. [name], [name] Primary Care Vernita Tague: [name] Other Clinician: Referring Kayl Stogdill: Treating Dusty Raczkowski/Extender: [name], [name] in Treatment: 8 Visit Information History Since Last Visit Added or deleted any medications: No Patient Arrived: Ambulatory Any new allergies or adverse reactions: No Arrival Time: 08:15 Had [name] fall or experienced change in No Accompanied By: transporter activities of daily living that may affect Transfer Assistance: Manual risk of falls: Patient Identification Verified: Yes Signs or symptoms of abuse/neglect since last visito No Secondary Verification Process Completed: Yes Hospitalized since last visit: No Patient Requires Transmission-Based Precautions: No Implantable device outside of the clinic excluding No Patient Has Alerts: No cellular tissue based products placed in the center since last visit: Has Dressing in Place as Prescribed: Yes Pain Present Now: No Notes Spoke with patient about being on time to his 0800 HBO treatments. Offered [name] later time, per patient unable due to afternoon doctor appts. Per patient will try to get here earlier but with his transporter/driver bringing him he will do his best. Explained the 1000 and 1200 times are full at this time. Electronic Signature(s) Signed: 06/22/2022 8:22:21 AM By: Valeria Batman EMT Previous Signature: 06/15/2022 4:58:54 PM Version By: [name] RN, [name] Entered By: Valeria Batman on 06/22/2022 08:22:21 -------------------------------------------------------------------------------- Encounter Discharge Information Details Patient Name:  Date of Service: [name], [name] 06/15/2022 8:00 [name] Medical Record Number: [id number] Patient Account Number: [id number] Date of Birth/Sex: Treating RN: 04-May-1956 (66 y.o. [name] Primary Care [name]: [name] Other Clinician: Referring Janesha Brissette: Treating [name]/Extender: [name] in Treatment: 8 Encounter Discharge Information Items Discharge Condition: Stable Ambulatory Status: Wheelchair Discharge Destination: Home Transportation: Private Auto Accompanied By: self Schedule Follow-up Appointment: Yes Clinical Summary of Care: Notes Able to switch another patient schedule HBO time to get patient in at the 1000 spot. Patient in agreement at 0930 starting tomorrow. [name] made aware. Electronic Signature(s) Signed: 06/22/2022 8:24:36 AM By: Valeria Batman EMT Previous Signature: 06/15/2022 4:58:54 PM Version By: [name] RN, [name] [name] ([id number]) [id number] Page 2 of 2 Entered By: Valeria Batman on 06/22/2022 08:24:36 -------------------------------------------------------------------------------- Vitals Details Patient Name: Date of Service: [name], [name] 06/15/2022 8:00 [name] Medical Record Number: [id number] Patient Account Number: [id number] Date of Birth/Sex: Treating RN: 14-Jun-1956 (66 y.o. [name] Primary Care [name]: [name] Other Clinician: Referring Tashaya Ancrum: Treating [name]/Extender: [name], [name] in Treatment: 8 Vital Signs Time Taken: 07:15 Temperature (F): 97.3 Height (in): 72 Pulse (bpm): 101 Weight (lbs): 220 Respiratory Rate (breaths/min): 16 Body Mass Index (BMI): 29.8 Blood Pressure (mmHg): 159/88 Capillary Blood Glucose (mg/dl): 249 Reference Range: 80 - 120 mg / dl Electronic Signature(s) Signed: 06/22/2022 8:22:36 AM By: Valeria Batman EMT Previous Signature: 06/15/2022 4:58:54 PM Version By: [name] RN, [name] Entered By: Valeria Batman on 06/22/2022 08:22:36

## 2022-06-16 NOTE — Progress Notes (Addendum)
[name], [name] ([id number]) [id number] Page 1 of 3 Visit Report for 06/16/2022 HBO Details Patient Name: Date of Service: [name], [name] 06/16/2022 10:00 [name] Medical Record Number: [id number] Patient Account Number: [id number] Date of Birth/Sex: Treating RN: [birth year] (66 y.o. [name] Session Primary Care Signora Zucco: [name] Other Clinician: [name] Referring [name]: Treating [name]/Extender: [name], [name] in Treatment: 9 HBO Treatment Course Details Treatment Course Number: 1 Ordering [name]: [name] T Treatments Ordered: otal 40 HBO Treatment Start Date: 06/01/2022 HBO Indication: Diabetic Ulcer(s) of the Lower Extremity Standard/Conservative Wound Care tried and failed greater than or equal to 30 days Wound #4 Left, Dorsal Foot , Wound #5 Left, Lateral Foot HBO Treatment Details Treatment Number: Treatment Aborted/Not Restarted: [name] Patient Type: Outpatient Chamber Type: Monoplace Chamber Serial #: [id number] Treatment Protocol: 2.5 ATA with 90 minutes oxygen, with two 5 minute air breaks Treatment Details Compression Rate Down: 1.0 psi / minute De-Compression Rate Up: 1.0 psi / minute [name] breaks and ir breathing Compress Tx Pressure Decompress Decompress periods Begins Reached Begins Ends (leave unused spaces blank) Chamber Pressure (ATA 1 2.5 2.5 2.5 2.5 2.5 - - 2.5 1 ) Clock Time (24 hr) 10:14 - - - - - --- 10:26 Treatment Length: 12 (minutes) Treatment Segments: 0 Vital Signs Capillary Blood Glucose Reference Range: 80 - 120 mg / dl HBO Diabetic Blood Glucose Intervention Range: <131 mg/dl or >249 mg/dl Time Vitals Blood Respiratory Capillary Blood Glucose Pulse Action Type: Pulse: Temperature: Taken: Pressure: Rate: Glucose (mg/dl): Meter #: Oximetry (%) Taken: Pre 09:43 118/68 81 18 97.3 194 1 manual BP Pre 08:58 134 1 Treatment Response Treatment Toleration:  Poor Treatment Completion Status: Treatment Aborted/Not Restarted Reason: [name] [name] Treatment Notes Patient arrived. Blood glucose was taken with result of 134 mg/dL. Patient stated that he had eaten [name] steak, egg, and cheese sandwich. Patient self- administered Afrin at 0900. Patient prepared for treatment and [name] second blood glucose was taken for safety with result of 194 mg/dL and vitals were taken at that time, as well. Initial BP was 89/66 mmHg with dinamap machine. Manual BP taken with result of 118/68 mmHg. Patient was safely placed in the chamber after safety check was performed. Travel was attempted at 1 psi/min. Upon reaching approximately 2 psig, patient stated that he was not able to equalize his ears. Pressure was reduced to 1.5 psig but that did not help. Patient attmepted several times using valsalva maneuver to equalize pressure. Chamber was turned off to return to ambient pressure. Patient felt that this helped and decided for [name] second attempt. Patient was unable to equalize pressure in middle ears at 2 psig with several different types of manipulation; valsalva, swallowing with nose plugged, yawning, etc. Informed Dr. Heber [name]. Dr. Heber [name] spoke to patient and told him to take decongestants at home. Treatment was aborted. Patient was stable upon discharge. Additional Procedure Documentation Tissue Sevierity: Necrosis of bone [name] Notes Patient had trouble equalizing the pressure in his ears. Afrin was given. Despite this he still could not equalize the pressures. Treatment was stopped. I recommended [name] decongestant prior to treatment. Physician HBO AttestationTHELMER, [name] ([id number]) [id number] Page 2 of 3 I certify that I supervised this HBO treatment in accordance with Medicare guidelines. [name] trained emergency response team is readily available per Yes hospital policies and procedures. Continue HBOT as ordered. Yes Electronic  Signature(s) Signed: 06/17/2022 2:35:00 PM By: [name] EMT Signed: 08/24/2022 3:52:16 PM By:  [name] Ham MD Previous Signature: 06/16/2022 4:34:47 PM Version By: [name] Previous Signature: 06/16/2022 3:54:23 PM Version By: [name] CHT EMT BS , , Previous Signature: 06/16/2022 3:50:31 PM Version By: [name] CHT EMT BS , , Entered By: [name] on 06/17/2022 14:34:59 -------------------------------------------------------------------------------- HBO Safety Checklist Details Patient Name: Date of Service: [name], [name] 06/16/2022 10:00 [name] Medical Record Number: [id number] Patient Account Number: [id number] Date of Birth/Sex: Treating RN: [birth year] (66 y.o. [name] Session Primary Care [name]: [name] Other Clinician: [name] Referring [name]: Treating [name]/Extender: [name], [name] in Treatment: 9 HBO Safety Checklist Items Safety Checklist Consent Form Signed Patient voided / foley secured and emptied When did you last eato 0830 Last dose of injectable or oral agent 0745 Ostomy pouch emptied and vented if applicable NA All implantable devices assessed, documented and approved NA Intravenous access site secured and place NA Valuables secured Linens and cotton and cotton/polyester blend (less than 51% polyester) Personal oil-based products / skin lotions / body lotions removed Wigs or hairpieces removed NA Smoking or tobacco materials removed NA Books / newspapers / magazines / loose paper removed Cologne, aftershave, perfume and deodorant removed Jewelry removed (may wrap wedding band) Make-up removed NA Hair care products removed Battery operated devices (external) removed Heating patches and chemical warmers removed Titanium eyewear removed Nail polish cured greater than 10 hours NA Casting material cured greater than 10 hours NA Hearing aids removed NA Loose  dentures or partials removed dentures removed Prosthetics have been removed NA Patient demonstrates correct use of air break device (if applicable) Patient concerns have been addressed Patient grounding bracelet on and cord attached to chamber Specifics for Inpatients (complete in addition to above) Medication sheet sent with patient NA Intravenous medications needed or due during therapy sent with patient NA Drainage tubes (e.g. nasogastric tube or chest tube secured and vented) NA Endotracheal or Tracheotomy tube secured NA Cuff deflated of air and inflated with saline NA Airway suctioned NA Notes Paper version used prior to treatment. [name], [name] ([id number]) [id number] Page 3 of 3 Electronic Signature(s) Signed: 06/17/2022 2:34:43 PM By: [name] EMT Previous Signature: 06/16/2022 2:57:07 PM Version By: [name] CHT EMT BS , , Entered By: [name] on 06/17/2022 14:34:42

## 2022-06-16 NOTE — Progress Notes (Signed)
[name], GATES [name] ([id number]) [id number] Page 1 of 4 Visit Report for 06/16/2022 Arrival Information Details Patient Name: Date of Service: [name], [name] 06/16/2022 10:00 [name] Medical Record Number: [id number] Patient Account Number: [id number] Date of Birth/Sex: Treating RN: [birth year] (66 y.o. [name] Primary Care Londyn Hotard: [name] Other Clinician: [name] Referring Eleazar Kimmey: Treating Copper Basnett/Extender: Lelon Frohlich, [name] in Treatment: 9 Visit Information History Since Last Visit All ordered tests and consults were completed: Yes Patient Arrived: Wheel Chair Added or deleted any medications: No Arrival Time: 08:54 Any new allergies or adverse reactions: No Accompanied By: self Had [name] fall or experienced change in No Transfer Assistance: None activities of daily living that may affect Patient Identification Verified: Yes risk of falls: Secondary Verification Process Completed: Yes Signs or symptoms of abuse/neglect since last visito No Patient Requires Transmission-Based Precautions: No Hospitalized since last visit: No Patient Has Alerts: No Implantable device outside of the clinic excluding No cellular tissue based products placed in the center since last visit: Pain Present Now: No Electronic Signature(s) Signed: 06/17/2022 2:34:08 PM By: Valeria Batman EMT Previous Signature: 06/16/2022 2:49:34 PM Version By: [name] CHT EMT BS , , Entered By: Valeria Batman on 06/17/2022 14:34:07 -------------------------------------------------------------------------------- Clinic Level of Care Assessment Details Patient Name: Date of Service: [name], [name] 06/16/2022 10:00 [name] Medical Record Number: [id number] Patient Account Number: [id number] Date of Birth/Sex: Treating RN: 10/22/1955 (66 y.o. [name] Primary Care Abrianna Sidman: [name] Other Clinician: [name] Referring Reagan Klemz: Treating Viraaj Vorndran/Extender: [name] in Treatment: 9 Clinic Level of Care Assessment Items TOOL 4 Quantity Score '[]'$  - 0 Use when only an EandM is performed on FOLLOW-UP visit ASSESSMENTS - Nursing Assessment / Reassessment '[]'$  - 0 Reassessment of Co-morbidities (includes updates in patient status) '[]'$  - 0 Reassessment of Adherence to Treatment Plan ASSESSMENTS - Wound and Skin [name] ssessment / Reassessment '[]'$  - 0 Simple Wound Assessment / Reassessment - one wound '[]'$  - 0 Complex Wound Assessment / Reassessment - multiple wounds '[]'$  - 0 Dermatologic / Skin Assessment (not related to wound area) ASSESSMENTS - Focused Assessment '[]'$  - 0 Circumferential Edema Measurements - multi extremities '[]'$  - 0 Nutritional Assessment / Counseling / Intervention DAYN, BARICH [name] ([id number]) [id number] Page 2 of 4 '[]'$  - 0 Lower Extremity Assessment (monofilament, tuning fork, pulses) '[]'$  - 0 Peripheral Arterial Disease Assessment (using hand held doppler) ASSESSMENTS - Ostomy and/or Continence Assessment and Care '[]'$  - 0 Incontinence Assessment and Management '[]'$  - 0 Ostomy Care Assessment and Management (repouching, etc.) PROCESS - Coordination of Care '[]'$  - 0 Simple Patient / Family Education for ongoing care '[]'$  - 0 Complex (extensive) Patient / Family Education for ongoing care '[]'$  - 0 Staff obtains Programmer, systems, Records, T Results / Process Orders est '[]'$  - 0 Staff telephones HHA, Nursing Homes / Clarify orders / etc '[]'$  - 0 Routine Transfer to another Facility (non-emergent condition) '[]'$  - 0 Routine Hospital Admission (non-emergent condition) '[]'$  - 0 New Admissions / Biomedical engineer / Ordering NPWT Apligraf, etc. , '[]'$  - 0 Emergency Hospital Admission (emergent condition) '[]'$  - 0 Simple Discharge Coordination '[]'$  - 0 Complex (extensive) Discharge Coordination PROCESS - Special Needs '[]'$  - 0 Pediatric /  Minor Patient Management '[]'$  - 0 Isolation Patient Management '[]'$  - 0 Hearing / Language / Visual special needs '[]'$  - 0 Assessment of Community assistance (transportation, D/C planning, etc.) '[]'$  - 0 Additional assistance /  Altered mentation '[]'$  - 0 Support Surface(s) Assessment (bed, cushion, seat, etc.) INTERVENTIONS - Wound Cleansing / Measurement '[]'$  - 0 Simple Wound Cleansing - one wound '[]'$  - 0 Complex Wound Cleansing - multiple wounds '[]'$  - 0 Wound Imaging (photographs - any number of wounds) '[]'$  - 0 Wound Tracing (instead of photographs) '[]'$  - 0 Simple Wound Measurement - one wound '[]'$  - 0 Complex Wound Measurement - multiple wounds INTERVENTIONS - Wound Dressings '[]'$  - 0 Small Wound Dressing one or multiple wounds '[]'$  - 0 Medium Wound Dressing one or multiple wounds '[]'$  - 0 Large Wound Dressing one or multiple wounds '[]'$  - 0 Application of Medications - topical '[]'$  - 0 Application of Medications - injection INTERVENTIONS - Miscellaneous '[]'$  - 0 External ear exam '[]'$  - 0 Specimen Collection (cultures, biopsies, blood, body fluids, etc.) '[]'$  - 0 Specimen(s) / Culture(s) sent or taken to Lab for analysis '[]'$  - 0 Patient Transfer (multiple staff / Civil Service fast streamer / Similar devices) '[]'$  - 0 Simple Staple / Suture removal (25 or less) '[]'$  - 0 Complex Staple / Suture removal (26 or more) '[]'$  - 0 Hypo / Hyperglycemic Management (close monitor of Blood Glucose) DAMIEON, ARMENDARIZ [name] ([id number]) [id number] Page 3 of 4 '[]'$  - 0 Ankle / Brachial Index (ABI) - do not check if billed separately X- 1 5 Vital Signs Has the patient been seen at the hospital within the last three years: Yes Total Score: 5 Level Of Care: New/Established - Level 1 Electronic Signature(s) Signed: 06/17/2022 2:45:30 PM By: [name] CHT EMT BS , , Entered By: [name] on 06/16/2022  15:52:06 -------------------------------------------------------------------------------- Encounter Discharge Information Details Patient Name: Date of Service: [name], [name] 06/16/2022 10:00 [name] Medical Record Number: [id number] Patient Account Number: [id number] Date of Birth/Sex: Treating RN: 1956/02/11 (66 y.o. [name] Primary Care Tavious Griesinger: [name] Other Clinician: [name] Referring Patsey Pitstick: Treating Chara Marquard/Extender: Lelon Frohlich, [name] in Treatment: 9 Encounter Discharge Information Items Discharge Condition: Stable Ambulatory Status: Wheelchair Discharge Destination: Home Transportation: Private Auto Accompanied By: driver Schedule Follow-up Appointment: No Clinical Summary of Care: Electronic Signature(s) Signed: 06/17/2022 2:35:39 PM By: Valeria Batman EMT Previous Signature: 06/16/2022 3:52:40 PM Version By: [name] CHT EMT BS , , Entered By: Valeria Batman on 06/17/2022 14:35:39 -------------------------------------------------------------------------------- General Visit Notes Details Patient Name: Date of Service: [name], [name] 06/16/2022 10:00 [name] Medical Record Number: [id number] Patient Account Number: [id number] Date of Birth/Sex: Treating RN: [birth year] (66 y.o. [name] Primary Care Daven Montz: [name] Other Clinician: [name] Referring Cashay Manganelli: Treating Shenicka Sunderlin/Extender: Lelon Frohlich, [name] in Treatment: 9 Notes Patient arrived. Blood glucose was taken with result of 134 mg/dL. Patient stated that he had eaten [name] steak, egg, and cheese sandwich. Patient self- administered Afrin at 0900. Patient prepared for treatment and [name] second blood glucose was taken for safety with result of 194 mg/dL and vitals were taken at that time, as well. Initial BP was 89/66 mmHg with dinamap machine. Manual BP taken with result of 118/68 mmHg. Patient was safely placed in  the chamber after safety check was performed. Travel was attempted at 1 psi/min. Upon reaching approximately 2 psig, patient stated that he was not able to equalize his ears. Pressure was reduced to 1.5 psig but that did not help. Patient attmepted several times using valsalva maneuver to equalize pressure. Chamber was turned off to return to ambient pressure. Patient felt that this helped and  decided for [name] second attempt. Patient was unable to equalize pressure in middle ears at 2 psig with several different types of manipulation; valsalva, swallowing with nose plugged, yawning, etc. Informed Dr. Heber Ostrander. Dr. Heber Berea spoke to patient and told him to take decongestants at home. Treatment was aborted. Patient was stable upon discharge. Initial start of Chamber [id number]; [id number]. Electronic Signature(s) Signed: 06/17/2022 4:39:57 PM By: [name] CHT EMT BS , , Previous Signature: 06/17/2022 4:37:30 PM Version By: [name] CHT EMT BS , , Entered By: [name] on 06/17/2022 16:39:57 [name] ([id number]) [id number] Page 4 of 4 -------------------------------------------------------------------------------- Vitals Details Patient Name: Date of Service: [name], [name] 06/16/2022 10:00 [name] Medical Record Number: [id number] Patient Account Number: [id number] Date of Birth/Sex: Treating RN: 28-Aug-1955 (66 y.o. [name] Primary Care Dulce Martian: [name] Other Clinician: [name] Referring Artemis Koller: Treating Natividad Schlosser/Extender: Lelon Frohlich, [name] in Treatment: 9 Vital Signs Time Taken: 09:43 Temperature (F): 97.3 Height (in): 72 Pulse (bpm): 81 Weight (lbs): 220 Respiratory Rate (breaths/min): 18 Body Mass Index (BMI): 29.8 Blood Pressure (mmHg): 118/68 Capillary Blood Glucose (mg/dl): 194 Reference Range: 80 - 120 mg / dl Electronic Signature(s) Signed: 06/17/2022 2:34:22 PM By:  Valeria Batman EMT Previous Signature: 06/16/2022 2:54:30 PM Version By: [name] CHT EMT BS , , Entered By: Valeria Batman on 06/17/2022 14:34:21

## 2022-06-17 ENCOUNTER — Encounter (HOSPITAL_BASED_OUTPATIENT_CLINIC_OR_DEPARTMENT_OTHER): Payer: Medicare HMO | Admitting: Internal Medicine

## 2022-06-17 DIAGNOSIS — E11621 Type 2 diabetes mellitus with foot ulcer: Secondary | ICD-10-CM | POA: Diagnosis not present

## 2022-06-17 LAB — GLUCOSE, CAPILLARY
Glucose-Capillary: 222 mg/dL — ABNORMAL HIGH (ref 70–99)
Glucose-Capillary: 134 mg/dL — ABNORMAL HIGH (ref 70–99)

## 2022-06-17 NOTE — Progress Notes (Signed)
[name], [name] A ([id number]) [id number] Page 1 of 2 Visit Report for 06/17/2022 Arrival Information Details Patient Name: Date of Service: [name], [name] 06/17/2022 10:00 A M Medical Record Number: [id number] Patient Account Number: [id number] Date of Birth/Sex: Treating RN: 02/22/1956 (66 y.o. [name], [name] Primary Care Callaway Hailes: [name] Other Clinician: Valeria Batman Referring Amahia Madonia: Treating Tyrease Vandeberg/Extender: Lelon Frohlich, [name] in Treatment: 9 Visit Information History Since Last Visit All ordered tests and consults were completed: Yes Patient Arrived: Wheel Chair Added or deleted any medications: No Arrival Time: 09:03 Any new allergies or adverse reactions: No Accompanied By: None Had a fall or experienced change in No Transfer Assistance: None activities of daily living that may affect Patient Identification Verified: Yes risk of falls: Secondary Verification Process Completed: Yes Signs or symptoms of abuse/neglect since last visito No Patient Requires Transmission-Based Precautions: No Hospitalized since last visit: No Patient Has Alerts: No Implantable device outside of the clinic excluding No cellular tissue based products placed in the center since last visit: Pain Present Now: No Electronic Signature(s) Signed: 06/17/2022 1:48:10 PM By: Valeria Batman EMT Entered By: Valeria Batman on 06/17/2022 13:48:10 -------------------------------------------------------------------------------- Encounter Discharge Information Details Patient Name: Date of Service: [name], [name] 06/17/2022 10:00 A M Medical Record Number: [id number] Patient Account Number: [id number] Date of Birth/Sex: Treating RN: 1956/02/13 (66 y.o. [name] Primary Care Guage Efferson: [name] Other Clinician: Valeria Batman Referring Shritha Bresee: Treating Tanaysia Bhardwaj/Extender: Lelon Frohlich, [name] in  Treatment: 9 Encounter Discharge Information Items Discharge Condition: Stable Ambulatory Status: Wheelchair Discharge Destination: Home Transportation: Private Auto Accompanied By: None Schedule Follow-up Appointment: No Clinical Summary of Care: Notes Not to return for HBO treatment until cleared by ENT. Electronic Signature(s) Signed: 06/17/2022 2:08:25 PM By: Valeria Batman EMT Entered By: Valeria Batman on 06/17/2022 14:08:25 [name] ([id number]) [id number] Page 2 of 2 -------------------------------------------------------------------------------- Vitals Details Patient Name: Date of Service: [name], [name] 06/17/2022 10:00 A M Medical Record Number: [id number] Patient Account Number: [id number] Date of Birth/Sex: Treating RN: 03/02/1956 (66 y.o. [name] Primary Care Alaster Asfaw: [name] Other Clinician: Valeria Batman Referring [name]: Treating Gilliam Hawkes/Extender: Lelon Frohlich, [name] in Treatment: 9 Vital Signs Time Taken: 09:19 Capillary Blood Glucose (mg/dl): 222 Height (in): 72 Reference Range: 80 - 120 mg / dl Weight (lbs): 220 Body Mass Index (BMI): 29.8 Electronic Signature(s) Signed: 06/17/2022 1:48:32 PM By: Valeria Batman EMT Entered By: Valeria Batman on 06/17/2022 13:48:32

## 2022-06-17 NOTE — Progress Notes (Addendum)
OLAJUWON, BEND A ([id number]) [id number] Page 1 of 1 Visit Report for 06/16/2022 SuperBill Details Patient Name: Date of Service: [name], [name] A. 06/16/2022 Medical Record Number: [id number] Patient Account Number: [id number] Date of Birth/Sex: Treating RN: 1956-04-30 (67 y.o. [name] Primary Care Provider: [name] Other Clinician: [name] Referring Provider: Treating Provider/Extender: Lelon Frohlich, [name] in Treatment: 9 Diagnosis Coding ICD-10 Codes Code Description 3676913126 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 E11.621 Type 2 diabetes mellitus with foot ulcer E11.42 Type 2 diabetes mellitus with diabetic polyneuropathy Facility Procedures CPT4 Code Description Modifier Quantity [id number] - WOUND CARE VISIT-LEV 1 EST PT 1 Electronic Signature(s) Signed: 06/17/2022 2:35:14 PM By: Valeria Batman EMT Signed: 08/24/2022 3:52:16 PM By: Linton Ham MD Previous Signature: 06/16/2022 3:51:19 PM Version By: [name] CHT EMT BS , , Previous Signature: 06/16/2022 4:34:47 PM Version By: [name] DO Entered By: Valeria Batman on 06/17/2022 14:35:13

## 2022-06-17 NOTE — Progress Notes (Signed)
MITHCELL, SCHUMPERT A ([id number]) [id number] Page 1 of 3 Visit Report for 06/17/2022 HBO Details Patient Name: Date of Service: [name], [name] 06/17/2022 10:00 A M Medical Record Number: [id number] Patient Account Number: [id number] Date of Birth/Sex: Treating RN: Mar 02, 1956 (66 y.o. [name], [name] Primary Care Alexzia Kasler: [name] Other Clinician: Valeria Batman Referring Anthonny Schiller: Treating Jerzie Bieri/Extender: Lelon Frohlich, [name] in Treatment: 9 HBO Treatment Course Details Treatment Course Number: 1 Ordering Ashlee Bewley: [name] T Treatments Ordered: otal 40 HBO Treatment Start Date: 06/01/2022 HBO Indication: Diabetic Ulcer(s) of the Lower Extremity Standard/Conservative Wound Care tried and failed greater than or equal to 30 days Wound #4 Left, Dorsal Foot , Wound #5 Left, Lateral Foot HBO Treatment Details Treatment Number: 11 Patient Type: Outpatient Chamber Type: Monoplace Chamber Serial #: [id number] Treatment Protocol: 2.0 ATA with 90 minutes oxygen, and no air breaks Treatment Details Compression Rate Down: 1.0 psi / minute De-Compression Rate Up: 1.0 psi / minute Air breaks and breathing Decompress Decompress Compress Tx Pressure Begins Reached periods Begins Ends (leave unused spaces blank) Chamber Pressure (ATA 1 2 ------2 1 ) Clock Time (24 hr) 10:09 10:33 - - - - - - 12:03 12:20 Treatment Length: 131 (minutes) Treatment Segments: 4 Vital Signs Capillary Blood Glucose Reference Range: 80 - 120 mg / dl HBO Diabetic Blood Glucose Intervention Range: <131 mg/dl or >249 mg/dl Time Vitals Blood Respiratory Capillary Blood Glucose Pulse Action Type: Pulse: Temperature: Taken: Pressure: Rate: Glucose (mg/dl): Meter #: Oximetry (%) Taken: Pre 09:19 222 Post 12:27 121/69 72 18 97.4 134 Treatment Response Treatment Toleration: Fair Adverse Events: 1:Barotrauma - Ear Treatment Completion Status:  Treatment Completed with Adverse Event Treatment Notes Travel rate was set at 1.0 PSI/min . at 9 PSI the patient stated that he was unable to clear his right ear. Travel stopped until he was able to clear. Travel restarted. Patient stated at 11.0 PSI he was having problems again clearing his right ear. Travel stopped. he was still unable to clear. Pressure reduced to 9.5 PSI. At that time he was able to clear. Compression restarted at a rate of 1.0 PSI/min. At 14.5 PSI he again stated that he was unable to clear his right ear and travel stopped until cleared. I spoke with Dr. Dellia Nims. Dr. Dellia Nims stated to travel to 2.0ATA (14.7 PSI) and start treatment time. T change order to 2.0 ATA for 90 min with no air break. o Ordered repeated. The patient had no other problems during his treatment. The patient had no problems noted during decompression. Dr. Dellia Nims called to chamber side to look at the patient ears. Dr. Dellia Nims stated was in both ears with a TEED2 to right ear. T have patient seen by ENT o before returning for anymore treatments. [facility] called. Notes faxed to ENT . Additional Procedure Documentation AIRIK, GOODLIN A ([id number]) [id number] Page 2 of 3 Tissue Sevierity: Necrosis of muscle Mihika Surrette Notes The patient again had a lot of trouble with his right ear. The dissent was delayed when he got to 2 atm He could not descend to 2.5atm therefore we held him at 2 but he is still very uncomfortable. otoscope exam revealed an irritated tympanic membrane which I think he has some middle ear fluid. Teeds 3. He is going to have to see ENT for myringotomy tube on the right before he can continue to be treated. Physician HBO Attestation: I certify that I supervised this HBO treatment in accordance with Medicare guidelines. A  trained emergency response team is readily available per Yes hospital policies and procedures. Continue HBOT as ordered. Yes Electronic  Signature(s) Signed: 06/17/2022 5:47:10 PM By: Linton Ham MD Previous Signature: 06/17/2022 2:06:12 PM Version By: Valeria Batman EMT Entered By: Linton Ham on 06/17/2022 17:36:31 -------------------------------------------------------------------------------- HBO Safety Checklist Details Patient Name: Date of Service: [name], [name] 06/17/2022 10:00 A M Medical Record Number: [id number] Patient Account Number: [id number] Date of Birth/Sex: Treating RN: 1955-11-09 (66 y.o. [name], [name] Primary Care Nasim Garofano: [name] Other Clinician: Valeria Batman Referring Indie Boehne: Treating Karlei Waldo/Extender: Lelon Frohlich, [name] in Treatment: 9 HBO Safety Checklist Items Safety Checklist Consent Form Signed Patient voided / foley secured and emptied When did you last eato 0830 Last dose of injectable or oral agent NA Ostomy pouch emptied and vented if applicable NA All implantable devices assessed, documented and approved NA Intravenous access site secured and place NA Valuables secured Linens and cotton and cotton/polyester blend (less than 51% polyester) Personal oil-based products / skin lotions / body lotions removed Wigs or hairpieces removed NA Smoking or tobacco materials removed Books / newspapers / magazines / loose paper removed Cologne, aftershave, perfume and deodorant removed Jewelry removed (may wrap wedding band) Make-up removed NA Hair care products removed Battery operated devices (external) removed Heating patches and chemical warmers removed Titanium eyewear removed NA Nail polish cured greater than 10 hours NA Casting material cured greater than 10 hours NA Hearing aids removed NA Loose dentures or partials removed removed by patient Prosthetics have been removed NA Patient demonstrates correct use of air break device (if applicable) Patient concerns have been addressed Patient grounding bracelet on and cord  attached to chamber Specifics for Inpatients (complete in addition to above) Medication sheet sent with patient NA Intravenous medications needed or due during therapy sent with patient NA Drainage tubes (e.g. nasogastric tube or chest tube secured and vented) NA REGINA, COPPOLINO A ([id number]) [id number] Page 3 of 3 Endotracheal or Tracheotomy tube secured NA Cuff deflated of air and inflated with saline NA Airway suctioned NA Notes The safety checklist was done before the safety checklist was done. The patient stated that he had an egg and cheese this morning. Electronic Signature(s) Signed: 06/17/2022 1:52:00 PM By: Valeria Batman EMT Entered By: Valeria Batman on 06/17/2022 13:51:59

## 2022-06-18 ENCOUNTER — Encounter (HOSPITAL_BASED_OUTPATIENT_CLINIC_OR_DEPARTMENT_OTHER): Payer: Medicare HMO | Admitting: Internal Medicine

## 2022-06-18 DIAGNOSIS — E11621 Type 2 diabetes mellitus with foot ulcer: Secondary | ICD-10-CM | POA: Diagnosis not present

## 2022-06-18 DIAGNOSIS — L97522 Non-pressure chronic ulcer of other part of left foot with fat layer exposed: Secondary | ICD-10-CM

## 2022-06-18 NOTE — Progress Notes (Addendum)
[name], [name] ([id number]) [id number] Page 1 of 14 Visit Report for 06/18/2022 Chief Complaint Document Details Patient Name: Date of Service: [name], [name]. 06/18/2022 12:30 PM Medical Record Number: [id number] Patient Account Number: [id number] Date of Birth/Sex: Treating RN: Oct 11, 1955 (66 y.o. M) Primary Care Provider: [name] Other Clinician: Referring Provider: Treating Provider/Extender: [name], [name] in Treatment: 9 Information Obtained from: Patient Chief Complaint 10/16/20; patient is here with [name] substantial wound on his right foot in the setting of [name] recent transmetatarsal amputation. 04/14/2022; referral from podiatry for postsurgical wound of nonhealing partial left fifth ray amputation Electronic Signature(s) Signed: 06/18/2022 5:16:42 PM By: [name] DO Entered By: [name] on 06/18/2022 13:23:56 -------------------------------------------------------------------------------- Cellular or Tissue Based Product Details Patient Name: Date of Service: [name], [name]. 06/18/2022 12:30 PM Medical Record Number: [id number] Patient Account Number: [id number] Date of Birth/Sex: Treating RN: [birth year] (66 y.o. [name], [name] Primary Care Provider: [name] Other Clinician: Referring Provider: Treating Provider/Extender: [name] in Treatment: 9 Cellular or Tissue Based Product Type Wound #4 Left,Dorsal Foot Applied to: Performed By: Physician [name], DO Cellular or Tissue Based Product Type: Grafix prime Level of Consciousness (Pre-procedure): Awake and Alert Pre-procedure Verification/Time Out Yes - 13:17 Taken: Location: genitalia / hands / feet / multiple digits Wound Size (sq cm): 0.9 Product Size (sq cm): 6 Waste Size (sq cm): 5 Waste Reason: WOUND SIZE, APPLIED TO OTHER WOUND Amount of Product Applied (sq cm): 1 Instrument  Used: Forceps, Scissors Lot #: [id number] Expiration Date: 10/12/2023 Fenestrated: No Reconstituted: Yes Solution Type: normal saline Solution Amount: 38m Lot #: [id number] Expiration Date: 12/03/2024 Secured: Yes Secured With: Steri-Strips Dressing Applied: Yes Primary Dressing: adaptic. gauze Procedural Pain: 0 Post Procedural Pain: 0 Response to Treatment: Procedure was tolerated well [name] ([id number]) [id number] Page 2 of 14 Level of Consciousness (Post- Awake and Alert procedure): Post Procedure Diagnosis Same as Pre-procedure Electronic Signature(s) Signed: 06/18/2022 5:16:09 PM By: BRhae HammockRN Signed: 06/18/2022 5:16:42 PM By: HKalman ShanDO Entered By: BRhae Hammockon 06/18/2022 13:18:57 -------------------------------------------------------------------------------- Cellular or Tissue Based Product Details Patient Name: Date of Service: [name] [name]. 06/18/2022 12:30 PM Medical Record Number: [id number] Account Number: [id number] of Birth/Sex: Treating RN: 107-06-1956(66y.o. MBurnadette Pop [name] Primary Care Provider: BRoselee NovaOther Clinician: Referring Provider: Treating Provider/Extender: HBurgess Amorin Treatment: 9 Cellular or Tissue Based Product Type Wound #5 Left,Lateral Foot Applied to: Performed By: Physician HKalman Shan DO Cellular or Tissue Based Product Type: Grafix prime Level of Consciousness (Pre-procedure): Awake and Alert Pre-procedure Verification/Time Out Yes - 13:17 Taken: Location: genitalia / hands / feet / multiple digits Wound Size (sq cm): 8.7 Product Size (sq cm): 6 Waste Size (sq cm): 5 Waste Reason: WOUND SIZE, OTHER APPLIED TO OTHER WOUND Amount of Product Applied (sq cm): 1 Instrument Used: Forceps, Scissors Lot #: [id number] Date: 10/12/2023 Fenestrated: No Reconstituted: Yes Solution Type:  normal saline Solution Amount: 362mLot #: [id number] Expiration Date: 12/03/2024 Secured: Yes Secured With: Steri-Strips Dressing Applied: Yes Primary Dressing: adaptic. gauze Procedural Pain: 0 Post Procedural Pain: 0 Response to Treatment: Procedure was tolerated well Level of Consciousness (Post- Awake and Alert procedure): Post Procedure Diagnosis Same as Pre-procedure Electronic Signature(s) Signed: 06/18/2022 5:16:09 PM By: BrRhae HammockN Signed: 06/18/2022 5:16:42 PM By: HoKalman ShanO Entered By: BrRhae Hammockn 06/18/2022 13:19:34 [name]  [name] ([id number]) [id number] Page 3 of 14 -------------------------------------------------------------------------------- Debridement Details Patient Name: Date of Service: [name], [name]. 06/18/2022 12:30 PM Medical Record Number: [id number] Patient Account Number: [id number] Date of Birth/Sex: Treating RN: 1955/10/18 (66 y.o. [name], [name] Primary Care Provider: [name] Other Clinician: Referring Provider: Treating Provider/Extender: [name], [name] in Treatment: 9 Debridement Performed for Assessment: Wound #4 Left,Dorsal Foot Performed By: Physician [name], DO Debridement Type: Debridement Severity of Tissue Pre Debridement: Fat layer exposed Level of Consciousness (Pre-procedure): Awake and Alert Pre-procedure Verification/Time Out Yes - 13:15 Taken: Start Time: 13:15 Pain Control: Lidocaine [name] Area Debrided (L x W): otal 1.5 (cm) x 0.6 (cm) = 0.9 (cm) Tissue and other material debrided: Viable, Non-Viable, Slough, Subcutaneous, Slough Level: Skin/Subcutaneous Tissue Debridement Description: Excisional Instrument: Curette Bleeding: Minimum Hemostasis Achieved: Pressure End Time: 13:15 Procedural Pain: 0 Post Procedural Pain: 0 Response to Treatment: Procedure was tolerated well Level of Consciousness (Post- Awake  and Alert procedure): Post Debridement Measurements of Total Wound Length: (cm) 1.5 Width: (cm) 0.6 Depth: (cm) 0.2 Volume: (cm) 0.141 Character of Wound/Ulcer Post Debridement: Improved Severity of Tissue Post Debridement: Fat layer exposed Post Procedure Diagnosis Same as Pre-procedure Electronic Signature(s) Signed: 06/18/2022 5:16:09 PM By: Rhae Hammock RN Signed: 06/18/2022 5:16:42 PM By: [name] DO Entered By: Rhae Hammock on 06/18/2022 13:15:47 -------------------------------------------------------------------------------- Debridement Details Patient Name: Date of Service: [name], [name]. 06/18/2022 12:30 PM Medical Record Number: [id number] Patient Account Number: [id number] Date of Birth/Sex: Treating RN: Jan 29, 1956 (66 y.o. [name], [name] Primary Care Provider: [name] Other Clinician: Referring Provider: Treating Provider/Extender: [name] in Treatment: 9 Debridement Performed for Assessment: Wound #5 Left,Lateral Foot Performed By: Physician [name], DO Debridement Type: Debridement Severity of Tissue Pre Debridement: Fat layer exposed Level of Consciousness (Pre-procedure): Awake and Alert Pre-procedure Verification/Time Out Yes - 13:15 MCDONALD, REILING [name] ([id number]) [id number] Page 4 of 14 Yes - 13:15 Taken: Start Time: 13:15 Pain Control: Lidocaine [name] Area Debrided (L x W): otal 5.8 (cm) x 1.5 (cm) = 8.7 (cm) Tissue and other material debrided: Viable, Non-Viable, Slough, Subcutaneous, Slough Level: Skin/Subcutaneous Tissue Debridement Description: Excisional Instrument: Curette Bleeding: Minimum Hemostasis Achieved: Pressure End Time: 13:15 Procedural Pain: 0 Post Procedural Pain: 0 Response to Treatment: Procedure was tolerated well Level of Consciousness (Post- Awake and Alert procedure): Post Debridement Measurements of Total Wound Length:  (cm) 5.8 Width: (cm) 1.5 Depth: (cm) 0.4 Volume: (cm) 2.733 Character of Wound/Ulcer Post Debridement: Improved Severity of Tissue Post Debridement: Fat layer exposed Post Procedure Diagnosis Same as Pre-procedure Electronic Signature(s) Signed: 06/18/2022 5:16:09 PM By: Rhae Hammock RN Signed: 06/18/2022 5:16:42 PM By: [name] DO Entered By: Rhae Hammock on 06/18/2022 13:16:48 -------------------------------------------------------------------------------- HPI Details Patient Name: Date of Service: [name], [name]. 06/18/2022 12:30 PM Medical Record Number: [id number] Patient Account Number: [id number] Date of Birth/Sex: Treating RN: Nov 26, 1955 (66 y.o. M) Primary Care Provider: [name] Other Clinician: Referring Provider: Treating Provider/Extender: [name], [name] in Treatment: 9 History of Present Illness HPI Description: ADMISSION 10/16/20 This is [name] 66 year old man who is [name] type II diabetic. Initially seen by Dr. Unk Lightning of vein and vascular on 09/10/2021 for bilateral lower extremity rest pain right greater than left. His ABIs demonstrated monophasic waveforms at the ankles bilaterally with depressed toe pressures. His ABI in the right was 0.5 and on the left 0.28. There were no obtainable waveforms for TBI's. He underwent  angiography on 09/10/2021 and had findings of severe PAD with 95% stenosis of the distal SFA. He also had peroneal and posterior tibial arteries occluded with significant collateralization in the calf there was reconstitution of the posterior tibial artery in the distal third of the calf. Ostia of the anterior tibial artery was not appreciated. He underwent [name] angioplasty of the superficial femoral artery. He required admission to hospital from 09/12/2021 through 09/22/2021 with right foot cellulitis and sepsis. On 3/13 he underwent [name] right below-knee popliteal to posterior tibial bypass using [name] greater saphenous  vein. Ultimately however he required [name] right TMA by podiatry which I believe was done on 3/17 for progressive diabetic foot infection. He was discharged to [facility] skilled facility. He arrives in clinic today with [name] large wound area over the entirety of his amputation site extending proximally. The attempted flap closure of the amputation site and his TMA has failed and the wound extends under the attempt to closure. Still some sutures in place. There is an odor but he is not systemically unwell. He is due for follow-up arterial studies and has an appointment with vascular surgery on 10/28/2021. They are using wet-to-dry dressings at [facility]. Past medical history includes type 2 diabetes with peripheral neuropathy, hypertension, obstructive sleep apnea 4/20; patient presents for follow-up. He is being discharged from his facility at [facility] today. They have been doing Dakin's wet-to-dry dressings. He states that his fiance can help with dressing changes. He is getting [facility] home [facility] to come out 3 times [name] week once he leaves the facility. He is scheduled to see vein and vascular on 5/12. He has not seen the wound himself. He puts covers over his face for wound exams. He currently denies systemic signs of infection. 4/28; patient admitted to the clinic 2 weeks ago with [name] dehisced right TMA in the setting of type 2 diabetes with significant PAD. He is status post revascularization. He has been discharged from the nursing home he was at and now is at home using Dak[employer]'s wet-to-dry dressings daily he has home health. He denies any systemic signs of infection [name], [name] ([id number]) [id number] Page 5 of 14 5/4; patient presents for follow-up. His fiance and home health have been changing his dressings. He currently denies systemic signs of infection. He continues to put sheet covers over his face during our wound encounters because he does not want [name] look at the  wound. 5/25; Patient presents for follow-up. He missed his last clinic appointment. He had [name] bone biopsy and culture done at last clinic visit that showed acute osteomyelitis with growth of E. coli, stenotrophomonas maltophilia and enteroccocus faecalis. [name] referral to infectious disease was made. He has not heard from them yet. He has been using Dakin's wet-to-dry dressings. He is now more comfortable with looking at the wound bed. 6/6; the patient comes into clinic today with 2 wounds on the left foot which apparently have been there for several months. This was not known to assess but was known by Dr. Donzetta Matters. In fact he underwent an angiogram yesterday. He had [name] laser atherectomy of the left posterior tibial artery with [name] 3 mm balloon angioplasty, also [name] stent of the last left SFA. 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 This is [name] lot better than the last time  I saw it. He has been using Dakin's wet-to-dry. He has an appointment with infectious disease for [name] bone biopsy which showed E. coli, stenotrophomonas and Enterococcus faecalis. The appointment is for tomorrow 04/14/2022 Mr. Rivers Hamrick ajuddin is an uncontrolled type 2 diabetic on insulin that presents with [name] left foot ulcer. On 12/18/2021 he required [name] left fifth ray amputation. He had revision of this site on 6/17 by Dr. Posey Pronto, podiatry. He states he has had [name] wound VAC on this area for the past 3 weeks. It is unclear what the prior wound care has been. He has [name] history of osteomyelitis to this area and was treated with IV and oral antibiotics For 6 weeks by Dr. Linus Salmons of infectious disease. At his last follow-up on 02/20/2022 his CRP was trending down and antibiotics were stopped. He also has [name] history of peripheral arterial disease status post stenting to the left SFA and balloon  angioplasty to the left posterior tibial artery On 12/2021. He has follow-up with vein and vascular at the end of the month. He also has [name] wound to the dorsal aspect of the left foot that he has been placing Betadine on. 10/19; patient presents for follow-up. He had [name] bone biopsy of the left foot at last clinic visit showed osteomyelitis. The bone culture showed Enterobacter cloacae, Enterococcus faecalis, Staphylococcus aureus, viridans group strep, actinotigum schaelii. He has been referred to infectious disease And has an appointment on October 25. He has been using Dakin's wet-to-dry dressing to the lateral left foot wound and Hydrofera Blue and Medihoney to the dorsal foot wound. He has no issues or complaints today. He denies systemic signs of infection. 10/26; patient presents for follow-up. He saw Dr. Linus Salmons yesterday with infectious disease and has been prescribed doxycycline and Levaquin for his chronic osteomyelitis. He has been using Dakin's wet-to-dry dressings to the lateral left foot wound and Medihoney and Hydrofera Blue to the dorsal wound. We discussed [name] skin graft for the dorsal foot wound and he would like to proceed with insurance verification. 11/3; patient with [name] wound on the dorsal left foot and [name] substantial postsurgical area on the lateral left foot fifth ray amputation. He is also followed by Dr. Alton Revere of infectious disease. He had [name] bone biopsy from the left lateral foot that showed acute osteomyelitis. Swab culture showed multiple organisms. He has also been revascularized by infectious disease status post left SFA stenting and tibial laser atherectomy and angioplasty. An MRI of the left foot had previously shown osteomyelitis of the fifth met head we have been using Dakin's wet-to-dry to the left lateral foot and Medihoney and Hydrofera Blue to the wound on the dorsal foot. 11/13; HBO treatment was discussed at last clinic visit and patient would like to proceed with this.  He has orientation today. He has been approved for Grafix. We have been using Hydrofera Blue and Medihoney to the left dorsal foot and collagen to the left lateral foot. Patient has home health that comes in for dressing changes. He currently has no issues or complaints today. 12/7; patient missed his last clinic appointment. He was last seen 3 weeks ago. He is currently off of antibiotics per ID. He completed [name] prolonged treatment. He has been using collagen to the lateral foot wound. He has been using Medihoney and Hydrofera Blue to the dorsal foot wound. He has been doing well with hyperbaric oxygen therapy. He has no issues or complaints today. He denies signs of of infection. 12/14;  patient presents for follow-up. We have been using Grafix to the wound beds. He has had trouble equalizing the pressure in his ears during HBO treatments. It was recommended he follow-up with ENT He does not have an appointment yet. He currently denies signs of infection. . Electronic Signature(s) Signed: 06/18/2022 5:16:42 PM By: [name] DO Entered By: [name] on 06/18/2022 13:26:06 -------------------------------------------------------------------------------- Physical Exam Details Patient Name: Date of Service: [name], [name]. 06/18/2022 12:30 PM Medical Record Number: [id number] Patient Account Number: [id number] Date of Birth/Sex: Treating RN: [birth year] (66 y.o. M) Primary Care Provider: [name] Other Clinician: Referring Provider: Treating Provider/Extender: [name], [name] in Treatment: 9 Constitutional respirations regular, non-labored and within target range for patient.. Cardiovascular 2+ dorsalis pedis/posterior tibialis pulses. Psychiatric pleasant and cooperative. Notes Left foot: [name] the lateral aspect there is an open wound with granulation tissue and nonviable tissue. No probing to bone . [name] the dorsal aspect there is an open o o wound  with granulation tissue and non viable tissue present. No signs of surrounding soft tissue infection. [name], [name] ([id number]) [id number] Page 6 of 14 Electronic Signature(s) Signed: 06/18/2022 5:16:42 PM By: [name] DO Entered By: [name] on 06/18/2022 13:26:37 -------------------------------------------------------------------------------- Physician Orders Details Patient Name: Date of Service: [name], [name]. 06/18/2022 12:30 PM Medical Record Number: [id number] Patient Account Number: [id number] Date of Birth/Sex: Treating RN: Jun 23, 1956 (66 y.o. [name], [name] Primary Care Provider: [name] Other Clinician: Referring Provider: Treating Provider/Extender: [name], [name] in Treatment: 9 Verbal / Phone Orders: No Diagnosis Coding Follow-up Appointments ppointment in 1 week. - W/ Dr. Heber [place] Return [name] Anesthetic (In clinic) Topical Lidocaine 5% applied to wound bed Cellular or Tissue Based Products Cellular or Tissue Based Product Type: - Run IVR for Grafix for left dorsal foot- pending Grafix # 1 applied 06/11/22 Grafix # 2 applied 06/18/22 Bathing/ Shower/ Hygiene May shower with protection but do not get wound dressing(s) wet. Edema Control - Lymphedema / SCD / Other Elevate legs to the level of the heart or above for 30 minutes daily and/or when sitting, [name] frequency of: - 3-4 times [name] day throughout the day. Avoid standing for long periods of time. Off-Loading Open toe surgical shoe to: - wear when standing or walking. Home Health New wound care orders this week; continue Home Health for wound care. May utilize formulary equivalent dressing for wound treatment orders unless otherwise specified. - skin subs in place on both wounds -do not remove adaptic and steri strips!! do not get wet!! Dressing changes to be completed by [place] on Monday / Wednesday / Friday except when patient  has scheduled visit at [facility]. Other Home Health Orders/Instructions: - [place] home [facility]. Hyperbaric Oxygen Therapy Evaluate for HBO Therapy Indication: - Wagner Grade III to left lateral foot 2.5 ATA for 90 Minutes with 2 Five (5) Minute [name] Breaks ir Total Number of Treatments: - 40 One treatments per day (delivered Monday through Friday unless otherwise specified in Special Instructions below): Finger stick Blood Glucose Pre- and Post- HBOT Treatment. Follow Hyperbaric Oxygen Glycemia Protocol Afrin (Oxymetazoline HCL) 0.05% nasal spray - 1 spray in both nostrils daily as needed prior to HBO treatment for difficulty clearing ears Wound Treatment Wound #4 - Foot Wound Laterality: Dorsal, Left Cleanser: Wound Cleanser (Home Health) 1 x Per Day/30 Days Discharge Instructions: Cleanse the wound with wound cleanser prior to applying [name] clean dressing using gauze  sponges, not tissue or cotton balls. Peri-Wound Care: Skin Prep (Home Health) 1 x Per Day/30 Days Discharge Instructions: Use skin prep as directed Prim Dressing: Grafix 1 x Per Day/30 Days ary Discharge Instructions: Secure with adaptic and steri strips Secondary Dressing: ABD Pad, 8x10 (Home Health) 1 x Per Day/30 Days Discharge Instructions: Apply over primary dressing as directed. Secured With: [employer], 4.5x3.1 (in/yd) (Home Health) 1 x Per Day/30 Days Discharge Instructions: Secure with Kerlix as directed. [name], HICKS [name] ([id number]) [id number] Page 7 of 14 Secured With: 48M Medipore H Soft Cloth Surgical [name] ape, 4 x 10 (in/yd) (Home Health) 1 x Per Day/30 Days Discharge Instructions: Secure with tape as directed. Wound #5 - Foot Wound Laterality: Left, Lateral Cleanser: Wound Cleanser (Home Health) 1 x Per Day/30 Days Discharge Instructions: Cleanse the wound with wound cleanser prior to applying [name] clean dressing using gauze sponges, not tissue or cotton balls. Peri-Wound  Care: Skin Prep (Home Health) 1 x Per Day/30 Days Discharge Instructions: Use skin prep as directed Prim Dressing: Grafix 1 x Per Day/30 Days ary Discharge Instructions: Secure with adaptic and steri strips Prim Dressing: Drawtex 1 x Per Day/30 Days ary Discharge Instructions: Apply drawtex over the adaptic. Patient will change everything from the drawtex to the outer dressing only. Secondary Dressing: ABD Pad, 8x10 (Home Health) 1 x Per Day/30 Days Discharge Instructions: Apply over primary dressing as directed. Secured With: [employer], 4.5x3.1 (in/yd) (Home Health) 1 x Per Day/30 Days Discharge Instructions: Secure with Kerlix as directed. Secured With: 48M Medipore H Soft Cloth Surgical [name] ape, 4 x 10 (in/yd) (Home Health) 1 x Per Day/30 Days Discharge Instructions: Secure with tape as directed. GLYCEMIA INTERVENTIONS PROTOCOL PRE-HBO GLYCEMIA INTERVENTIONS ACTION INTERVENTION Obtain pre-HBO capillary blood glucose (ensure 1 physician order is in chart). [name]. Notify HBO physician and await physician orders. 2 If result is 70 mg/dl or below: B. If the result meets the hospital definition of [name] critical result, follow hospital policy. [name]. Give patient an 8 ounce Glucerna Shake, an 8 ounce Ensure, or 8 ounces of [name] Glucerna/Ensure equivalent dietary supplement*. B. Wait 30 minutes. If result is 71 mg/dl to 130 mg/dl: C. Retest patients capillary blood glucose (CBG). D. If result greater than or equal to 110 mg/dl, proceed with HBO. If result less than 110 mg/dl, notify HBO physician and consider holding HBO. If result is 131 mg/dl to 249 mg/dl: [name]. Proceed with HBO. [name]. Notify HBO physician and await physician orders. B. It is recommended to hold HBO and do If result is 250 mg/dl or greater: blood/urine ketone testing. C. If the result meets the hospital definition of [name] critical result, follow hospital policy. POST-HBO GLYCEMIA INTERVENTIONS ACTION  INTERVENTION Obtain post HBO capillary blood glucose (ensure 1 physician order is in chart). [name]. Notify HBO physician and await physician orders. 2 If result is 70 mg/dl or below: B. If the result meets the hospital definition of [name] critical result, follow hospital policy. [name]. Give patient an 8 ounce Glucerna Shake, an 8 ounce Ensure, or 8 ounces of [name] Glucerna/Ensure equivalent dietary supplement*. B. Wait 15 minutes for symptoms of If result is 71 mg/dl to 100 mg/dl: hypoglycemia (i.e. nervousness, anxiety, sweating, chills, clamminess, irritability, confusion, tachycardia or dizziness). C. If patient asymptomatic, discharge patient. If patient symptomatic, repeat capillary blood glucose (CBG) and notify HBO physician. If result is 101 mg/dl to 249 mg/dl: [name]. Discharge patient. [name]. Notify HBO physician and await physician orders. B. It  is recommended to do blood/urine ketone If result is 250 mg/dl or greater: testing. C. If the result meets the hospital definition of [name] critical result, follow hospital policy. *Juice or candies are NOT equivalent products. If patient refuses the Glucerna or Ensure, please consult the hospital dietitian for an appropriate substitute. PEYTON, SPENGLER [name] ([id number]) [id number] Page 8 of 14 Electronic Signature(s) Signed: 06/18/2022 5:16:42 PM By: [name] DO Entered By: [name] on 06/18/2022 13:26:48 -------------------------------------------------------------------------------- Problem List Details Patient Name: Date of Service: [name], [name]. 06/18/2022 12:30 PM Medical Record Number: [id number] Patient Account Number: [id number] Date of Birth/Sex: Treating RN: 1955/07/27 (66 y.o. M) Primary Care Provider: [name] Other Clinician: Referring Provider: Treating Provider/Extender: [name], [name] in Treatment: 9 Active Problems ICD-10 Encounter Code Description  Active Date MDM Diagnosis L97.522 Non-pressure chronic ulcer of other part of left foot with fat layer exposed 04/14/2022 No Yes M86.172 Other acute osteomyelitis, left ankle and foot 05/08/2022 No Yes L97.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 specified 04/14/2022 No Yes severity E11.621 Type 2 diabetes mellitus with foot ulcer 04/14/2022 No Yes E11.42 Type 2 diabetes mellitus with diabetic polyneuropathy 04/14/2022 No Yes Inactive Problems Resolved Problems Electronic Signature(s) Signed: 06/18/2022 5:16:42 PM By: [name] DO Entered By: [name] on 06/18/2022 13:23:41 -------------------------------------------------------------------------------- Progress Note Details Patient Name: Date of Service: [name], [name]. 06/18/2022 12:30 PM Medical Record Number: [id number] Patient Account Number: [id number] Date of Birth/Sex: Treating RN: 12/23/1955 (66 y.o. M) Primary Care Provider: [name] Other Clinician: Referring Provider: Treating Provider/Extender: [name], [name] in Treatment: [address] SARAH, ZERBY [name] ([id number]) [id number] Page 9 of 14 Chief Complaint Information obtained from Patient 10/16/20; patient is here with [name] substantial wound on his right foot in the setting of [name] recent transmetatarsal amputation. 04/14/2022; referral from podiatry for postsurgical wound of nonhealing partial left fifth ray amputation History of Present Illness (HPI) ADMISSION 10/16/20 This is [name] 66 year old man who is [name] type II diabetic. Initially seen by Dr. Unk Lightning of vein and vascular on 09/10/2021 for bilateral lower extremity rest pain right greater than left. His ABIs demonstrated monophasic waveforms at the ankles bilaterally with depressed toe pressures. His ABI in the right was 0.5 and on the left 0.28. There were no obtainable waveforms for TBI's. He underwent angiography on  09/10/2021 and had findings of severe PAD with 95% stenosis of the distal SFA. He also had peroneal and posterior tibial arteries occluded with significant collateralization in the calf there was reconstitution of the posterior tibial artery in the distal third of the calf. Ostia of the anterior tibial artery was not appreciated. He underwent [name] angioplasty of the superficial femoral artery. He required admission to hospital from 09/12/2021 through 09/22/2021 with right foot cellulitis and sepsis. On 3/13 he underwent [name] right below-knee popliteal to posterior tibial bypass using [name] greater saphenous vein. Ultimately however he required [name] right TMA by podiatry which I believe was done on 3/17 for progressive diabetic foot infection. He was discharged to [facility] skilled facility. He arrives in clinic today with [name] large wound area over the entirety of his amputation site extending proximally. The attempted flap closure of the amputation site and his TMA has failed and the wound extends under the attempt to closure. Still some sutures in place. There is an odor but he is not systemically unwell. He is due for follow-up arterial studies and has an appointment  with vascular surgery on 10/28/2021. They are using wet-to-dry dressings at [facility]. Past medical history includes type 2 diabetes with peripheral neuropathy, hypertension, obstructive sleep apnea 4/20; patient presents for follow-up. He is being discharged from his facility at [facility] today. They have been doing Dakin's wet-to-dry dressings. He states that his fiance can help with dressing changes. He is getting [facility] home [facility] to come out 3 times [name] week once he leaves the facility. He is scheduled to see vein and vascular on 5/12. He has not seen the wound himself. He puts covers over his face for wound exams. He currently denies systemic signs of infection. 4/28; patient admitted to the clinic 2 weeks ago with [name] dehisced right TMA in the setting  of type 2 diabetes with significant PAD. He is status post revascularization. He has been discharged from the nursing home he was at and now is at home using Dakin's wet-to-dry dressings daily he has home health. He denies any systemic signs of infection 5/4; patient presents for follow-up. His fiance and home health have been changing his dressings. He currently denies systemic signs of infection. He continues to put sheet covers over his face during our wound encounters because he does not want [name] look at the wound. 5/25; Patient presents for follow-up. He missed his last clinic appointment. He had [name] bone biopsy and culture done at last clinic visit that showed acute osteomyelitis with growth of E. coli, stenotrophomonas maltophilia and enteroccocus faecalis. [name] referral to infectious disease was made. He has not heard from them yet. He has been using Dakin's wet-to-dry dressings. He is now more comfortable with looking at the wound bed. 6/6; the patient comes into clinic today with 2 wounds on the left foot which apparently have been there for several months. This was not known to assess but was known by Dr. Donzetta Matters. In fact he underwent an angiogram yesterday. He had [name] laser atherectomy of the left posterior tibial artery with [name] 3 mm balloon angioplasty, also [name] stent of the last left SFA. 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 This is [name] lot better than the last time I saw it. He has been using Dakin's wet-to-dry. He has an appointment with infectious disease for [name] bone biopsy which showed E. coli, stenotrophomonas and Enterococcus faecalis. The appointment is for tomorrow 04/14/2022 Mr. Gjon Letarte ajuddin is an uncontrolled type 2 diabetic on insulin that presents with [name] left foot ulcer. On 12/18/2021 he required [name] left fifth ray amputation.  He had revision of this site on 6/17 by Dr. Posey Pronto, podiatry. He states he has had [name] wound VAC on this area for the past 3 weeks. It is unclear what the prior wound care has been. He has [name] history of osteomyelitis to this area and was treated with IV and oral antibiotics For 6 weeks by Dr. Linus Salmons of infectious disease. At his last follow-up on 02/20/2022 his CRP was trending down and antibiotics were stopped. He also has [name] history of peripheral arterial disease status post stenting to the left SFA and balloon angioplasty to the left posterior tibial artery On 12/2021. He has follow-up with vein and vascular at the end of the month. He also has [name] wound to the dorsal aspect of the left foot that he has been placing Betadine  on. 10/19; patient presents for follow-up. He had [name] bone biopsy of the left foot at last clinic visit showed osteomyelitis. The bone culture showed Enterobacter cloacae, Enterococcus faecalis, Staphylococcus aureus, viridans group strep, actinotigum schaelii. He has been referred to infectious disease And has an appointment on October 25. He has been using Dakin's wet-to-dry dressing to the lateral left foot wound and Hydrofera Blue and Medihoney to the dorsal foot wound. He has no issues or complaints today. He denies systemic signs of infection. 10/26; patient presents for follow-up. He saw Dr. Linus Salmons yesterday with infectious disease and has been prescribed doxycycline and Levaquin for his chronic osteomyelitis. He has been using Dakin's wet-to-dry dressings to the lateral left foot wound and Medihoney and Hydrofera Blue to the dorsal wound. We discussed [name] skin graft for the dorsal foot wound and he would like to proceed with insurance verification. 11/3; patient with [name] wound on the dorsal left foot and [name] substantial postsurgical area on the lateral left foot fifth ray amputation. He is also followed by Dr. Alton Revere of infectious disease. He had [name] bone biopsy from the left lateral foot  that showed acute osteomyelitis. Swab culture showed multiple organisms. He has also been revascularized by infectious disease status post left SFA stenting and tibial laser atherectomy and angioplasty. An MRI of the left foot had previously shown osteomyelitis of the fifth met head we have been using Dakin's wet-to-dry to the left lateral foot and Medihoney and Hydrofera Blue to the wound on the dorsal foot. 11/13; HBO treatment was discussed at last clinic visit and patient would like to proceed with this. He has orientation today. He has been approved for Grafix. We have been using Hydrofera Blue and Medihoney to the left dorsal foot and collagen to the left lateral foot. Patient has home health that comes in for dressing changes. He currently has no issues or complaints today. 12/7; patient missed his last clinic appointment. He was last seen 3 weeks ago. He is currently off of antibiotics per ID. He completed [name] prolonged treatment. He has been using collagen to the lateral foot wound. He has been using Medihoney and Hydrofera Blue to the dorsal foot wound. He has been doing well with hyperbaric oxygen therapy. He has no issues or complaints today. He denies signs of of infection. 12/14; patient presents for follow-up. We have been using Grafix to the wound beds. He has had trouble equalizing the pressure in his ears during HBO treatments. It was recommended he follow-up with ENT He does not have an appointment yet. He currently denies signs of infection. . Patient History Information obtained from Patient, Chart. Family History Diabetes - Mother,Siblings, Heart Disease - Siblings, Hypertension - Siblings, Stroke - Mother, No family history of Cancer, Hereditary Spherocytosis, Kidney Disease, Lung Disease, Seizures, Thyroid Problems, Tuberculosis. [name], [name] ([id number]) [id number] Page 10 of 4 Social History Former smoker, Marital Status - Single, Alcohol  Use - Never, Drug Use - No History, Caffeine Use - Daily. Medical History Eyes Denies history of Cataracts, Glaucoma, Optic Neuritis Respiratory Patient has history of Asthma, Sleep Apnea Denies history of Aspiration, Chronic Obstructive Pulmonary Disease (COPD), Pneumothorax, Tuberculosis Cardiovascular Patient has history of Hypertension, Peripheral Venous Disease Denies history of Angina, Arrhythmia, Congestive Heart Failure, Coronary Artery Disease, Deep Vein Thrombosis, Hypotension, Myocardial Infarction, Peripheral Arterial Disease, Phlebitis, Vasculitis Endocrine Patient has history of Type II Diabetes Denies history of Type I Diabetes Integumentary (Skin) Denies history of History of Burn Musculoskeletal Patient has history  of Osteomyelitis Denies history of Gout, Rheumatoid Arthritis, Osteoarthritis Neurologic Patient has history of Neuropathy Denies history of Dementia, Quadriplegia, Paraplegia, Seizure Disorder Hospitalization/Surgery History - 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 Posey Pronto. - aortogram 12/08/2021 Dr. Virl Cagey [facility]. Medical [name] Surgical History Notes nd Psychiatric PTSD Objective Constitutional respirations regular, non-labored and within target range for patient.. Vitals Time Taken: 12:51 PM, Height: 72 in, Weight: 220 lbs, BMI: 29.8, Temperature: 98.4 F, Pulse: 81 bpm, Respiratory Rate: 18 breaths/min, Blood Pressure: 133/79 mmHg, Capillary Blood Glucose: 288 mg/dl. Cardiovascular 2+ dorsalis pedis/posterior tibialis pulses. Psychiatric pleasant and cooperative. General Notes: Left foot: [name] the lateral aspect there is an open wound with granulation tissue and nonviable tissue. No probing to bone . [name] the dorsal aspect o o there is an open wound with granulation tissue and non viable tissue present. No signs of surrounding soft tissue infection. Integumentary (Hair, Skin) Wound #4 status is Open. Original cause of wound was Gradually  Appeared. The date acquired was: 09/03/2021. The wound has been in treatment 9 weeks. The wound is located on the Left,Dorsal Foot. The wound measures 1.5cm length x 0.6cm width x 0.2cm depth; 0.707cm^2 area and 0.141cm^3 volume. There is Fat Layer (Subcutaneous Tissue) exposed. There is no tunneling or undermining noted. There is [name] medium amount of serosanguineous drainage noted. The wound margin is distinct with the outline attached to the wound base. There is large (67-100%) pink, pale granulation within the wound bed. There is no necrotic tissue within the wound bed. The periwound skin appearance had no abnormalities noted for moisture. The periwound skin appearance had no abnormalities noted for color. The periwound skin appearance did not exhibit: Callus, Crepitus, Excoriation, Induration, Rash, Scarring. Periwound temperature was noted as No Abnormality. Wound #5 status is Open. Original cause of wound was Surgical Injury. The date acquired was: 12/20/2021. The wound has been in treatment 9 weeks. The wound is located on the Left,Lateral Foot. The wound measures 5.8cm length x 1.5cm width x 0.4cm depth; 6.833cm^2 area and 2.733cm^3 volume. There is bone and Fat Layer (Subcutaneous Tissue) exposed. There is no tunneling or undermining noted. There is [name] medium amount of serosanguineous drainage noted. The wound margin is epibole. There is large (67-100%) red, pink granulation within the wound bed. There is [name] small (1-33%) amount of necrotic tissue within the wound bed including Adherent Slough. The periwound skin appearance had no abnormalities noted for color. The periwound skin appearance exhibited: Callus. The periwound skin appearance did not exhibit: Crepitus, Excoriation, Induration, Rash, Scarring, Dry/Scaly, Maceration. Periwound temperature was noted as No Abnormality. Assessment Active Problems ICD-10 Non-pressure chronic ulcer of other part of left foot with fat layer exposed Other  acute osteomyelitis, left ankle and foot Non-pressure chronic ulcer of other part of left foot with other specified severity Type 2 diabetes mellitus with foot ulcer [name], [name] ([id number]) [id number] Page 11 of 14 Type 2 diabetes mellitus with diabetic polyneuropathy Patient's wounds are stable. I debrided nonviable tissue. Grafix was placed in standard fashion. We are trying to get him an appointment with ENT HBO is . held off at this point since he is having trouble equalizing the pressure in his ears. Follow-up in 1 week. Procedures Wound #4 Pre-procedure diagnosis of Wound #4 is [name] Diabetic Wound/Ulcer of the Lower Extremity located on the Left,Dorsal Foot .Severity of Tissue Pre Debridement is: Fat layer exposed. There was [name] Excisional Skin/Subcutaneous Tissue Debridement with [name] total area of 0.9 sq cm performed by  [name], DO. With the following instrument(s): Curette to remove Viable and Non-Viable tissue/material. Material removed includes Subcutaneous Tissue and Slough and after achieving pain control using Lidocaine. No specimens were taken. [name] time out was conducted at 13:15, prior to the start of the procedure. [name] Minimum amount of bleeding was controlled with Pressure. The procedure was tolerated well with [name] pain level of 0 throughout and [name] pain level of 0 following the procedure. Post Debridement Measurements: 1.5cm length x 0.6cm width x 0.2cm depth; 0.141cm^3 volume. Character of Wound/Ulcer Post Debridement is improved. Severity of Tissue Post Debridement is: Fat layer exposed. Post procedure Diagnosis Wound #4: Same as Pre-Procedure Pre-procedure diagnosis of Wound #4 is [name] Diabetic Wound/Ulcer of the Lower Extremity located on the Left,Dorsal Foot. [name] skin graft procedure using [name] bioengineered skin substitute/cellular or tissue based product was performed by [name], DO with the following instrument(s): Forceps and  Scissors. Grafix prime was applied and secured with Steri-Strips. 1 sq cm of product was utilized and 5 sq cm was wasted due to Nellie, APPLIED TO OTHER WOUND. Post Application, adaptic. gauze was applied. [name] Time Out was conducted at 13:17, prior to the start of the procedure. The procedure was tolerated well with [name] pain level of 0 throughout and [name] pain level of 0 following the procedure. Post procedure Diagnosis Wound #4: Same as Pre-Procedure . Wound #5 Pre-procedure diagnosis of Wound #5 is [name] Diabetic Wound/Ulcer of the Lower Extremity located on the Left,Lateral Foot .Severity of Tissue Pre Debridement is: Fat layer exposed. There was [name] Excisional Skin/Subcutaneous Tissue Debridement with [name] total area of 8.7 sq cm performed by [name], DO. With the following instrument(s): Curette to remove Viable and Non-Viable tissue/material. Material removed includes Subcutaneous Tissue and Slough and after achieving pain control using Lidocaine. No specimens were taken. [name] time out was conducted at 13:15, prior to the start of the procedure. [name] Minimum amount of bleeding was controlled with Pressure. The procedure was tolerated well with [name] pain level of 0 throughout and [name] pain level of 0 following the procedure. Post Debridement Measurements: 5.8cm length x 1.5cm width x 0.4cm depth; 2.733cm^3 volume. Character of Wound/Ulcer Post Debridement is improved. Severity of Tissue Post Debridement is: Fat layer exposed. Post procedure Diagnosis Wound #5: Same as Pre-Procedure Pre-procedure diagnosis of Wound #5 is [name] Diabetic Wound/Ulcer of the Lower Extremity located on the Left,Lateral Foot. [name] skin graft procedure using [name] bioengineered skin substitute/cellular or tissue based product was performed by [name], DO with the following instrument(s): Forceps and Scissors. Grafix prime was applied and secured with Steri-Strips. 1 sq cm of product was utilized and 5 sq cm was wasted due to [place], OTHER APPLIED TO OTHER WOUND. Post Application, adaptic. gauze was applied. [name] Time Out was conducted at 13:17, prior to the start of the procedure. The procedure was tolerated well with [name] pain level of 0 throughout and [name] pain level of 0 following the procedure. Post procedure Diagnosis Wound #5: Same as Pre-Procedure . Plan Follow-up Appointments: Return Appointment in 1 week. - W/ Dr. Heber [place] Anesthetic: (In clinic) Topical Lidocaine 5% applied to wound bed Cellular or Tissue Based Products: Cellular or Tissue Based Product Type: - Run IVR for Grafix for left dorsal foot- pending Grafix # 1 applied 06/11/22 Grafix # 2 applied 06/18/22 Bathing/ Shower/ Hygiene: May shower with protection but do not get wound dressing(s) wet. Edema Control - Lymphedema / SCD / Other: Elevate legs to the  level of the heart or above for 30 minutes daily and/or when sitting, [name] frequency of: - 3-4 times [name] day throughout the day. Avoid standing for long periods of time. Off-Loading: Open toe surgical shoe to: - wear when standing or walking. Home Health: New wound care orders this week; continue Home Health for wound care. May utilize formulary equivalent dressing for wound treatment orders unless otherwise specified. - skin subs in place on both wounds -do not remove adaptic and steri strips!! do not get wet!! Dressing changes to be completed by [facility] on Monday / Wednesday / Friday except when patient has scheduled visit at [facility]. Other Home Health Orders/Instructions: - [facility]. Hyperbaric Oxygen Therapy: Evaluate for HBO Therapy Indication: - Wagner Grade III to left lateral foot 2.5 ATA for 90 Minutes with 2 Five (5) Minute Air Breaks [name] Number of Treatments: - 40 otal One treatments per day (delivered Monday through Friday unless otherwise specified in Special Instructions below): Finger stick Blood Glucose Pre- and Post- HBOT Treatment. Follow Hyperbaric Oxygen  Glycemia Protocol Afrin (Oxymetazoline HCL) 0.05% nasal spray - 1 spray in both nostrils daily as needed prior to HBO treatment for difficulty clearing ears WOUND #4: - Foot Wound Laterality: Dorsal, Left Cleanser: Wound Cleanser (Home Health) 1 x Per Day/30 Days Discharge Instructions: Cleanse the wound with wound cleanser prior to applying [name] clean dressing using gauze sponges, not tissue or cotton balls. Peri-Wound Care: Skin Prep (Home Health) 1 x Per Day/30 Days Discharge Instructions: Use skin prep as directed Prim Dressing: Grafix 1 x Per Day/30 Days ary RICCARDO, HOLEMAN [name] ([id number]) [id number] Page 12 of 14 Discharge Instructions: Secure with adaptic and steri strips Secondary Dressing: ABD Pad, 8x10 (Home Health) 1 x Per Day/30 Days Discharge Instructions: Apply over primary dressing as directed. Secured With: [employer], 4.5x3.1 (in/yd) (Home Health) 1 x Per Day/30 Days Discharge Instructions: Secure with Kerlix as directed. Secured With: 40M Medipore H Soft Cloth Surgical [name] ape, 4 x 10 (in/yd) (Home Health) 1 x Per Day/30 Days Discharge Instructions: Secure with tape as directed. WOUND #5: - Foot Wound Laterality: Left, Lateral Cleanser: Wound Cleanser (Home Health) 1 x Per Day/30 Days Discharge Instructions: Cleanse the wound with wound cleanser prior to applying [name] clean dressing using gauze sponges, not tissue or cotton balls. Peri-Wound Care: Skin Prep (Home Health) 1 x Per Day/30 Days Discharge Instructions: Use skin prep as directed Prim Dressing: Grafix 1 x Per Day/30 Days ary Discharge Instructions: Secure with adaptic and steri strips Prim Dressing: Drawtex 1 x Per Day/30 Days ary Discharge Instructions: Apply drawtex over the adaptic. Patient will change everything from the drawtex to the outer dressing only. Secondary Dressing: ABD Pad, 8x10 (Home Health) 1 x Per Day/30 Days Discharge Instructions: Apply over primary dressing as  directed. Secured With: [employer], 4.5x3.1 (in/yd) (Home Health) 1 x Per Day/30 Days Discharge Instructions: Secure with Kerlix as directed. Secured With: 40M Medipore H Soft Cloth Surgical [name] ape, 4 x 10 (in/yd) (Home Health) 1 x Per Day/30 Days Discharge Instructions: Secure with tape as directed. 1. In office sharp debridement 2. Grafix placed in standard fashion 3. Aggressive offloadingoosurgical shoe 4. Follow-up in 1 week 5. Follow-up with ENT Electronic Signature(s) Signed: 06/18/2022 5:16:42 PM By: [name] DO Entered By: [name] on 06/18/2022 13:30:21 -------------------------------------------------------------------------------- HxROS Details Patient Name: Date of Service: [name], [place] [name]. 06/18/2022 12:30 PM Medical Record Number: [id number] Patient Account Number: [id number]  Date of Birth/Sex: Treating RN: 09-Mar-1956 (66 y.o. M) Primary Care Provider: [name] Other Clinician: Referring Provider: Treating Provider/Extender: [name] in Treatment: 9 Information Obtained From Patient Chart Eyes Medical History: Negative for: Cataracts; Glaucoma; Optic Neuritis Respiratory Medical History: Positive for: Asthma; Sleep Apnea Negative for: Aspiration; Chronic Obstructive Pulmonary Disease (COPD); Pneumothorax; Tuberculosis Cardiovascular Medical History: Positive for: Hypertension; Peripheral Venous Disease Negative for: Angina; Arrhythmia; Congestive Heart Failure; Coronary Artery Disease; Deep Vein Thrombosis; Hypotension; Myocardial Infarction; Peripheral Arterial Disease; Phlebitis; Vasculitis Endocrine Medical History: Positive for: Type II Diabetes Negative for: Type I Diabetes Time with diabetes: 20 years Treated with: Insulin, Oral agents RODGERS, LIKES [name] ([id number]) [id number] Page 13 of 14 Blood sugar tested every day: Yes Tested : 4x day Integumentary  (Skin) Medical History: Negative for: History of Burn Musculoskeletal Medical History: Positive for: Osteomyelitis Negative for: Gout; Rheumatoid Arthritis; Osteoarthritis Neurologic Medical History: Positive for: Neuropathy Negative for: Dementia; Quadriplegia; Paraplegia; Seizure Disorder Psychiatric Medical History: Past Medical History Notes: PTSD Immunizations Pneumococcal Vaccine: Received Pneumococcal Vaccination: No Implantable Devices None Hospitalization / Surgery History Type of Hospitalization/Surgery 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 Posey Pronto aortogram 12/08/2021 Dr. Virl Cagey [facility] Family and Social History Cancer: No; Diabetes: Yes - Mother,Siblings; Heart Disease: Yes - Siblings; Hereditary Spherocytosis: No; Hypertension: Yes - Siblings; Kidney Disease: No; Lung Disease: No; Seizures: No; Stroke: Yes - Mother; Thyroid Problems: No; Tuberculosis: No; Former smoker; Marital Status - Single; Alcohol Use: Never; Drug Use: No History; Caffeine Use: Daily; Financial Concerns: No; Food, Clothing or Shelter Needs: No; Support System Lacking: No; Transportation Concerns: No Electronic Signature(s) Signed: 06/18/2022 5:16:42 PM By: [name] DO Entered By: [name] on 06/18/2022 13:26:11 -------------------------------------------------------------------------------- SuperBill Details Patient Name: Date of Service: [name], [name]. 06/18/2022 Medical Record Number: [id number] Patient Account Number: [id number] Date of Birth/Sex: Treating RN: 1955/11/06 (66 y.o. M) Primary Care Provider: [name] Other Clinician: Referring Provider: Treating Provider/Extender: [name], [name] in Treatment: 9 Diagnosis Coding ICD-10 Codes Code Description [phone] Non-pressure chronic ulcer of other part of left foot with fat layer exposed M86.172 Other acute osteomyelitis, left ankle and foot L97.528 Non-pressure  chronic ulcer of other part of left foot with other specified severity E11.621 Type 2 diabetes mellitus with foot ulcer E11.42 Type 2 diabetes mellitus with diabetic polyneuropathy KEON, PENDER [name] ([id number]) [id number] Page 14 of 14 Facility Procedures : CPT4 Code: 97353299 Description: 24268 - SKIN SUB GRAFT FACE/NK/HF/G ICD-10 Diagnosis Description L97.522 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.621 Type 2 diabetes mellitus with foot ulcer Modifier: Quantity: 1 : CPT4 Code: 34196222 Description: Q4133- Grafix PL 16 mm disc (2 units) Modifier: Quantity: 2 Physician Procedures : CPT4 Code Description Modifier [id number] 15275 - WC PHYS SKIN SUB GRAFT FACE/NK/HF/G ICD-10 Diagnosis Description L97.522 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.621 Type 2 diabetes mellitus with foot ulcer Quantity: 1 Electronic Signature(s) Signed: 07/08/2022 5:35:40 PM By: Rhae Hammock RN Signed: 08/24/2022 4:03:05 PM By: [name] DO Previous Signature: 06/18/2022 5:16:42 PM Version By: [name] DO Entered By: Rhae Hammock on 06/25/2022 13:52:07

## 2022-06-18 NOTE — Progress Notes (Addendum)
AUTHOR, HATLESTAD [name] ([id number]) [id number] Page 1 of 8 Visit Report for 06/18/2022 Arrival Information Details Patient Name: Date of Service: [name], [name]. 06/18/2022 12:30 PM Medical Record Number: [id number] Patient Account Number: [id number] Date of Birth/Sex: Treating RN: [birth year] (66 y.o. M) Primary Care [name]: [name] Other Clinician: Referring [name]: Treating [name]/Extender: [name], [name] in Treatment: 9 Visit Information History Since Last Visit Added or deleted any medications: No Patient Arrived: Wheel Chair Any new allergies or adverse reactions: No Arrival Time: 12:48 Had [name] fall or experienced change in No Accompanied By: friend activities of daily living that may affect Transfer Assistance: None risk of falls: Patient Identification Verified: Yes Signs or symptoms of abuse/neglect since last visito No Secondary Verification Process Completed: Yes Hospitalized since last visit: No Patient Requires Transmission-Based Precautions: No Implantable device outside of the clinic excluding No Patient Has Alerts: No cellular tissue based products placed in the center since last visit: Has Dressing in Place as Prescribed: Yes Pain Present Now: No Electronic Signature(s) Signed: 06/18/2022 4:17:22 PM By: [name] Entered By: [name] on 06/18/2022 12:49:26 -------------------------------------------------------------------------------- Encounter Discharge Information Details Patient Name: Date of Service: [name], [name]. 06/18/2022 12:30 PM Medical Record Number: [id number] Patient Account Number: [id number] Date of Birth/Sex: Treating RN: [name] (66 y.o. [name], [name] Primary Care [name]: [name] Other Clinician: Referring Jauan Wohl: Treating [name]/Extender: [name] in Treatment: 9 Encounter Discharge Information Items Post  Procedure Vitals Discharge Condition: Stable Temperature (F): 98.7 Ambulatory Status: Ambulatory Pulse (bpm): 74 Discharge Destination: Home Respiratory Rate (breaths/min): 17 Transportation: Private Auto Blood Pressure (mmHg): 120/80 Accompanied By: self Schedule Follow-up Appointment: Yes Clinical Summary of Care: Patient Declined Electronic Signature(s) Signed: 07/08/2022 5:35:40 PM By: Rhae Hammock RN Entered By: Rhae Hammock on 06/25/2022 13:52:37 [name] ([id number]) [id number] Page 2 of 8 -------------------------------------------------------------------------------- Lower Extremity Assessment Details Patient Name: Date of Service: [name]. 06/18/2022 12:30 PM Medical Record Number: [id number] Patient Account Number: [id number] Date of Birth/Sex: Treating RN: [birth year] (66 y.o. M) Primary Care [name]: [name] Other Clinician: Referring Avonda Toso: Treating [name]/Extender: [name], [name] in Treatment: 9 Edema Assessment Assessed: [Left: No] [Right: No] Edema: [Left: Ye] [Right: s] Calf Left: Right: Point of Measurement: 36 cm From Medial Instep 39.5 cm Ankle Left: Right: Point of Measurement: 9 cm From Medial Instep 23.2 cm Electronic Signature(s) Signed: 06/18/2022 4:17:22 PM By: [name] Entered By: [name] on 06/18/2022 13:01:07 -------------------------------------------------------------------------------- Multi Wound Chart Details Patient Name: Date of Service: [name], [name]. 06/18/2022 12:30 PM Medical Record Number: [id number] Patient Account Number: [id number] Date of Birth/Sex: Treating RN: [birth year][name] (66 y.o. M) Primary Care [name]: [name] Other Clinician: Referring Saiya Crist: Treating Cardin Nitschke/Extender: [name], [name] in Treatment: 9 Vital Signs Height(in): 72 Capillary Blood Glucose(mg/dl): 288 Weight(lbs):  220 Pulse(bpm): 49 Body Mass Index(BMI): 29.8 Blood Pressure(mmHg): 133/79 Temperature(F): 98.4 Respiratory Rate(breaths/min): 18 [4:Photos:] [[name]:[name]] Left, Dorsal Foot Left, Lateral Foot [name] Wound Location: Gradually Appeared Surgical Injury [name] Wounding Event: Diabetic Wound/Ulcer of the Lower Diabetic Wound/Ulcer of the Lower [name] Primary Etiology: Extremity Extremity [name] Open Surgical Wound [name] Secondary Etiology: Asthma, Sleep Apnea, Hypertension, Asthma, Sleep Apnea, Hypertension, [name] Comorbid HistorySADARIUS, [name] ([id number]) [id number] Page 3 of 8 Peripheral Venous Disease, Type II Peripheral Venous Disease, Type II Diabetes, Osteomyelitis, Neuropathy Diabetes, Osteomyelitis, Neuropathy 09/03/2021 12/20/2021 [name] Date Acquired: 9 9 [name] [name]:  Open Open [name] Wound Status: No No [name] Wound Recurrence: 1.5x0.6x0.2 5.8x1.5x0.4 [name] Measurements L x W x D (cm) 0.707 6.833 [name] [name] (cm) : rea 0.141 2.733 [name] Volume (cm) : 70.00% 31.70% [name] % Reduction in [name] rea: 70.10% 45.30% [name] % Reduction in Volume: Grade 3 Grade 3 [name] Classification: Medium Medium [name] Exudate [name] mount: Serosanguineous Serosanguineous [name] Exudate Type: red, brown red, brown [name] Exudate Color: Distinct, outline attached Epibole [name] Wound Margin: Large (67-100%) Large (67-100%) [name] Granulation [name] mount: Pink, Pale Red, Pink [name] Granulation Quality: None Present (0%) Small (1-33%) [name] Necrotic [name] mount: Fat Layer (Subcutaneous Tissue): Yes Fat Layer (Subcutaneous Tissue): Yes [name] Exposed Structures: Fascia: No Bone: Yes Tendon: No Fascia: No Muscle: No Tendon: No Joint: No Muscle: No Bone: No Joint: No Small (1-33%) Small (1-33%) [name] Epithelialization: Debridement - Excisional Debridement - Excisional [name] Debridement: Pre-procedure Verification/Time Out 13:15 13:15 [name] Taken: Lidocaine Lidocaine [name] Pain Control: Subcutaneous, Slough  Subcutaneous, Slough [name] Tissue Debrided: Skin/Subcutaneous Tissue Skin/Subcutaneous Tissue [name] Level: 0.9 8.7 [name] Debridement [name] (sq cm): rea Curette Curette [name] Instrument: Minimum Minimum [name] Bleeding: Pressure Pressure [name] Hemostasis [name] chieved: 0 0 [name] Procedural Pain: 0 0 [name] Post Procedural Pain: Procedure was tolerated well Procedure was tolerated well [name] Debridement Treatment Response: 1.5x0.6x0.2 5.8x1.5x0.4 [name] Post Debridement Measurements L x W x D (cm) 0.141 2.733 [name] Post Debridement Volume: (cm) Excoriation: No Callus: Yes [name] Periwound Skin Texture: Induration: No Excoriation: No Callus: No Induration: No Crepitus: No Crepitus: No Rash: No Rash: No Scarring: No Scarring: No Maceration: No Maceration: No [name] Periwound Skin Moisture: Dry/Scaly: No Dry/Scaly: No Atrophie Blanche: No Atrophie Blanche: No [name] Periwound Skin Color: Cyanosis: No Cyanosis: No Ecchymosis: No Ecchymosis: No Erythema: No Erythema: No Hemosiderin Staining: No Hemosiderin Staining: No Mottled: No Mottled: No Pallor: No Pallor: No Rubor: No Rubor: No No Abnormality No Abnormality [name] Temperature: Cellular or Tissue Based Product Cellular or Tissue Based Product [name] Procedures Performed: Debridement Debridement Treatment Notes Electronic Signature(s) Signed: 06/18/2022 5:16:42 PM By: [name] DO Entered By: [name] on 06/18/2022 13:23:46 -------------------------------------------------------------------------------- Multi-Disciplinary Care Plan Details Patient Name: Date of Service: [name], [name]. 06/18/2022 12:30 PM Medical Record Number: [id number] Patient Account Number: [id number] Date of Birth/Sex: Treating RN: [birth year] [name] (66 y.o. [name], [name] Primary Care Jamilyn Pigeon: [name] Other Clinician: [name] ([id number]) [id number] Page 4 of 8 Referring Anthone Prieur: Treating  Fed Ceci/Extender: [name], [name] in Treatment: 9 Active Inactive Osteomyelitis Nursing Diagnoses: Infection: osteomyelitis Goals: Diagnostic evaluation for osteomyelitis completed as ordered Date Initiated: 04/23/2022 Target Resolution Date: 07/03/2022 Goal Status: Active Signs and symptoms for osteomyelitis will be recognized and promptly addressed Date Initiated: 04/23/2022 Target Resolution Date: 06/25/2022 Goal Status: Active Interventions: Assess for signs and symptoms of osteomyelitis resolution every visit Provide education on osteomyelitis Screen for HBO Treatment Activities: Biopsy : 04/16/2022 Consult for HBO : 04/23/2022 Surgical debridement : 04/23/2022 Systemic antibiotics : 04/23/2022 T ordered outside of clinic : 04/23/2022 est Notes: Wound/Skin Impairment Nursing Diagnoses: Knowledge deficit related to ulceration/compromised skin integrity Goals: Patient/caregiver will verbalize understanding of skin care regimen Date Initiated: 04/14/2022 Target Resolution Date: 06/24/2022 Goal Status: Active Interventions: Assess patient/caregiver ability to perform ulcer/skin care regimen upon admission and as needed Assess ulceration(s) every visit Provide education on ulcer and skin care Notes: Electronic Signature(s) Signed: 06/18/2022 5:16:09 PM By: Rhae Hammock RN Entered By: Rhae Hammock on 06/18/2022 11:23:11 -------------------------------------------------------------------------------- Pain Assessment Details Patient Name: Date of Service:  [name], [name]. 06/18/2022 12:30 PM Medical Record Number: [id number] Patient Account Number: [id number] Date of Birth/Sex: Treating RN: [birth year] [name] (66 y.o. M) Primary Care Luqman Perrelli: [name] Other Clinician: Referring Frankie Scipio: Treating Emmalia Heyboer/Extender: [name], [name] in Treatment: 9 Active Problems Location of Pain Severity and Description of  Pain Patient Has Paino No [name], [name] ([id number]) [id number] Page 5 of 8 Patient Has Paino No Site Locations Pain Management and Medication Current Pain Management: Electronic Signature(s) Signed: 06/18/2022 4:17:22 PM By: [name] Entered By: [name] on 06/18/2022 12:51:59 -------------------------------------------------------------------------------- Patient/Caregiver Education Details Patient Name: Date of Service: [name] 12/14/2023andnbsp12:30 PM Medical Record Number: [id number] Patient Account Number: [id number] Date of Birth/Gender: Treating RN: [birth year] (66 y.o. [name] Primary Care Physician: [name] Other Clinician: Referring Physician: Treating Physician/Extender: [name] in Treatment: 9 Education Assessment Education Provided To: Patient Education Topics Provided Infection: Methods: Explain/Verbal Responses: Reinforcements needed, State content correctly [employer]) Signed: 06/18/2022 5:16:09 PM By: Rhae Hammock RN Entered By: Rhae Hammock on 06/18/2022 11:23:24 -------------------------------------------------------------------------------- Wound Assessment Details Patient Name: Date of Service: [name], [name]. 06/18/2022 12:30 PM Medical Record Number: [id number] Patient Account Number: [id number] Date of Birth/Sex: Treating RN: [name] [birth year], [name] (66 y.o. [name] ([id number]) [id number] Page 6 of 8 Primary Care Cher Egnor: [name] Other Clinician: Referring Arno Cullers: Treating Zaul Hubers/Extender: [name], [name] in Treatment: 9 Wound Status Wound Number: 4 Primary Diabetic Wound/Ulcer of the Lower Extremity Etiology: Wound Location: Left, Dorsal Foot Wound Open Wounding Event: Gradually Appeared Status: Date Acquired: 09/03/2021 Comorbid Asthma, Sleep Apnea,  Hypertension, Peripheral Venous Disease, [name]: 9 History: Type II Diabetes, Osteomyelitis, Neuropathy Clustered Wound: No Photos Wound Measurements Length: (cm) 1.5 Width: (cm) 0.6 Depth: (cm) 0.2 Area: (cm) 0.707 Volume: (cm) 0.141 % Reduction in Area: 70% % Reduction in Volume: 70.1% Epithelialization: Small (1-33%) Tunneling: No Undermining: No Wound Description Classification: Grade 3 Wound Margin: Distinct, outline attached Exudate Amount: Medium Exudate Type: Serosanguineous Exudate Color: red, brown Foul Odor After Cleansing: No Slough/Fibrino Yes Wound Bed Granulation Amount: Large (67-100%) Exposed Structure Granulation Quality: Pink, Pale Fascia Exposed: No Necrotic Amount: None Present (0%) Fat Layer (Subcutaneous Tissue) Exposed: Yes Tendon Exposed: No Muscle Exposed: No Joint Exposed: No Bone Exposed: No Periwound Skin Texture Texture Color No Abnormalities Noted: No No Abnormalities Noted: Yes Callus: No Temperature / Pain Crepitus: No Temperature: No Abnormality Excoriation: No Induration: No Rash: No Scarring: No Moisture No Abnormalities Noted: Yes Electronic Signature(s) Signed: 06/18/2022 4:17:22 PM By: [name] Entered By: [name] on 06/18/2022 13:05:10 [name] ([id number]) [id number] Page 7 of 8 -------------------------------------------------------------------------------- Wound Assessment Details Patient Name: Date of Service: [name], [name]. 06/18/2022 12:30 PM Medical Record Number: [id number] Patient Account Number: [id number] Date of Birth/Sex: Treating RN: [birth year]-[name] (66 y.o. M) Primary Care Dwayn Moravek: [name] Other Clinician: Referring Kiva Norland: Treating Valerio Pinard/Extender: [name], [name] in Treatment: 9 Wound Status Wound Number: 5 Primary Diabetic Wound/Ulcer of the Lower Extremity Etiology: Wound Location: Left, Lateral  Foot Secondary Open Surgical Wound Wounding Event: Surgical Injury Etiology: Date Acquired: 12/20/2021 Wound Open [name]: 9 Status: Clustered Wound: No Comorbid Asthma, Sleep Apnea, Hypertension, Peripheral Venous Disease, History: Type II Diabetes, Osteomyelitis, Neuropathy Photos Wound Measurements Length: (cm) 5.8 Width: (cm) 1.5 Depth: (cm) 0.4 Area: (cm) 6.833 Volume: (cm) 2.733 % Reduction in Area: 31.7% % Reduction in Volume: 45.3% Epithelialization:  Small (1-33%) Tunneling: No Undermining: No Wound Description Classification: Grade 3 Wound Margin: Epibole Exudate Amount: Medium Exudate Type: Serosanguineous Exudate Color: red, brown Foul Odor After Cleansing: No Slough/Fibrino Yes Wound Bed Granulation Amount: Large (67-100%) Exposed Structure Granulation Quality: Red, Pink Fascia Exposed: No Necrotic Amount: Small (1-33%) Fat Layer (Subcutaneous Tissue) Exposed: Yes Necrotic Quality: Adherent Slough Tendon Exposed: No Muscle Exposed: No Joint Exposed: No Bone Exposed: Yes Periwound Skin Texture Texture Color No Abnormalities Noted: No No Abnormalities Noted: Yes Callus: Yes Temperature / Pain Crepitus: No Temperature: No Abnormality Excoriation: No Induration: No Rash: No Scarring: No Moisture No Abnormalities Noted: No Dry / Scaly: No Maceration: No Electronic Signature(s) Signed: 06/18/2022 4:17:22 PM By: [name] Entered By: [name] on 06/18/2022 13:05:30 [name] ([id number]) [id number] Page 8 of 8 -------------------------------------------------------------------------------- Vitals Details Patient Name: Date of Service: [name], [name]. 06/18/2022 12:30 PM Medical Record Number: [id number] Patient Account Number: [id number] Date of Birth/Sex: Treating RN: [birth year][name] (66 y.o. M) Primary Care Belva Koziel: [name] Other Clinician: Referring Rodrigo Mcgranahan: Treating  Quinnten Calvin/Extender: [name], [name] in Treatment: 9 Vital Signs Time Taken: 12:51 Temperature (F): 98.4 Height (in): 72 Pulse (bpm): 81 Weight (lbs): 220 Respiratory Rate (breaths/min): 18 Body Mass Index (BMI): 29.8 Blood Pressure (mmHg): 133/79 Capillary Blood Glucose (mg/dl): 288 Reference Range: 80 - 120 mg / dl Electronic Signature(s) Signed: 06/18/2022 4:17:22 PM By: [name] Entered By: [name] on 06/18/2022 12:51:52

## 2022-06-18 NOTE — Progress Notes (Signed)
[name] ([id number]) [id number] Page 1 of 4 Visit Report for 06/17/2022 Physician Orders Details Patient Name: Date of Service: [name], [name] 06/17/2022 10:00 [name] Medical Record Number: [id number] Patient Account Number: [id number] Date of Birth/Sex: Treating RN: 1955/12/22 (66 y.o. [name], [name] Primary Care Provider: [name] Other Clinician: Referring Provider: Treating Provider/Extender: Lelon Frohlich, [name] in Treatment: 9 Verbal / Phone Orders: No Diagnosis Coding ICD-10 Coding Code Description [id number] Non-pressure chronic ulcer of other part of left foot with fat layer exposed M86.172 Other acute osteomyelitis, left ankle and foot L97.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 specified severity E11.621 Type 2 diabetes mellitus with foot ulcer E11.42 Type 2 diabetes mellitus with diabetic polyneuropathy Hyperbaric Oxygen Therapy 2.0 ATA for 90 Minutes without [name] Breaks - Decrease patient treatments to 2.0 ATA for 90 minutes with no air breaks starting tomorrow 06/18/2022. ir One treatments per day (delivered Monday through Friday unless otherwise specified in Special Instructions below): Finger stick Blood Glucose Pre- and Post- HBOT Treatment. Follow Hyperbaric Oxygen Glycemia Protocol Afrin (Oxymetazoline HCL) 0.05% nasal spray - 1 spray in both nostrils daily as needed prior to HBO treatment for difficulty clearing ears Wound Treatment Consults Ear, Nose Throat - Referral to ENT for evaluation and placement of ET tubes to bilateral ears to unable to clear with hyperbarics. - (ICD10 M86.172 - Other acute osteomyelitis, left ankle and foot) GLYCEMIA INTERVENTIONS PROTOCOL PRE-HBO GLYCEMIA INTERVENTIONS ACTION INTERVENTION Obtain pre-HBO capillary blood glucose (ensure 1 physician order is in chart). [name]. Notify HBO physician and await physician orders. 2 If result is 70 mg/dl or  below: B. If the result meets the hospital definition of [name] critical result, follow hospital policy. [name]. Give patient an 8 ounce Glucerna Shake, an 8 ounce Ensure, or 8 ounces of [name] Glucerna/Ensure equivalent dietary supplement*. B. Wait 30 minutes. If result is 71 mg/dl to 130 mg/dl: C. Retest patients capillary blood glucose (CBG). D. If result greater than or equal to 110 mg/dl, proceed with HBO. If result less than 110 mg/dl, notify HBO physician and consider holding HBO. If result is 131 mg/dl to 249 mg/dl: [name]. Proceed with HBO. [name]. Notify HBO physician and await physician orders. B. It is recommended to hold HBO and do If result is 250 mg/dl or greater: blood/urine ketone testing. C. If the result meets the hospital definition of [name] critical result, follow hospital policy. POST-HBO GLYCEMIA INTERVENTIONS ACTION INTERVENTION Obtain post HBO capillary blood glucose (ensure 1 physician order is in chart). [name]. Notify HBO physician and await physician orders. 2 If result is 70 mg/dl or below: B. If the result meets the hospital definition of [name] critical result, follow hospital policy. [name]. Give patient an [address] an [name] ([id number]) [id number] Page 2 of 4 8 ounce Ensure, or 8 ounces of [name] Glucerna/Ensure equivalent dietary supplement*. B. Wait 15 minutes for symptoms of If result is 71 mg/dl to 100 mg/dl: hypoglycemia (i.e. nervousness, anxiety, sweating, chills, clamminess, irritability, confusion, tachycardia or dizziness). C. If patient asymptomatic, discharge patient. If patient symptomatic, repeat capillary blood glucose (CBG) and notify HBO physician. If result is 101 mg/dl to 249 mg/dl: [name]. Discharge patient. [name]. Notify HBO physician and await physician orders. B. It is recommended to do blood/urine ketone If result is 250 mg/dl or greater: testing. C. If the result meets the hospital definition of  [name] critical result, follow hospital policy. *Juice or candies  are NOT equivalent products. If patient refuses the Glucerna or Ensure, please consult the hospital dietitian for an appropriate substitute. Electronic Signature(s) Signed: 06/17/2022 5:46:55 PM By: [name] RN, [name] Signed: 06/17/2022 5:47:10 PM By: Linton Ham MD Entered By: [name] on 06/17/2022 12:03:41 Prescription 06/17/2022 -------------------------------------------------------------------------------- [name]. Linton Ham MD Patient Name: Provider: 09/24/1955 [id number] Date of Birth: NPI#: [name] [id number] Sex: DEA #: [phone] Phone #: License #: [place] Patient Address: [place] [address] [place], [place] [address] [place], [place] [address] [phone] Allergies gemfibrozil; OxyContin; pork derived (porcine); simvastatin Provider's Orders Ear, Nose Throat - ICD10: M86.172 - Referral to ENT for evaluation and placement of ET tubes to bilateral ears to unable to clear with hyperbarics. Hand Signature: Date(s): Electronic Signature(s) Signed: 06/17/2022 5:46:55 PM By: [name] RN, [name] Signed: 06/17/2022 5:47:10 PM By: Linton Ham MD Entered By: [name] on 06/17/2022 12:03:42 -------------------------------------------------------------------------------- Problem List Details Patient Name: Date of Service: [name], [name] 06/17/2022 10:00 [name] Medical Record Number: [id number] Patient Account Number: [id number] Date of Birth/Sex: Treating RN: Nov 11, 1955 (66 y.o. [name] Primary Care Provider: [name] Other Clinician: Referring Provider: Treating Provider/Extender: Lelon Frohlich, [name] in Treatment: [address] [name] ([id number]) [id number] Page 3 of 4 Active Problems ICD-10 Encounter Code Description Active Date  MDM Diagnosis L97.522 Non-pressure chronic ulcer of other part of left foot with fat layer exposed 04/14/2022 No Yes M86.172 Other acute osteomyelitis, left ankle and foot 05/08/2022 No Yes L97.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 specified 04/14/2022 No Yes severity E11.621 Type 2 diabetes mellitus with foot ulcer 04/14/2022 No Yes E11.42 Type 2 diabetes mellitus with diabetic polyneuropathy 04/14/2022 No Yes Inactive Problems Resolved Problems Electronic Signature(s) Signed: 06/17/2022 2:07:12 PM By: Valeria Batman EMT Signed: 06/17/2022 5:47:10 PM By: Linton Ham MD Entered By: Valeria Batman on 06/17/2022 14:07:12 -------------------------------------------------------------------------------- SuperBill Details Patient Name: Date of Service: [name], [name] 06/17/2022 Medical Record Number: [id number] Patient Account Number: [id number] Date of Birth/Sex: Treating RN: 12-Apr-1956 (66 y.o. [name], [name] Primary Care Provider: [name] Other Clinician: Valeria Batman Referring Provider: Treating Provider/Extender: Lelon Frohlich, [name] in Treatment: 9 Diagnosis Coding ICD-10 Codes Code Description [phone] Non-pressure chronic ulcer of other part of left foot with fat layer exposed M86.172 Other acute osteomyelitis, left ankle and foot L97.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 specified severity E11.621 Type 2 diabetes mellitus with foot ulcer E11.42 Type 2 diabetes mellitus with diabetic polyneuropathy Facility Procedures : BOLIVAR, KORANDA Code: [id number] Suhayb [name] [phone] Description: G0277-(Facility Use Only) HBOT full body chamber, 22mn , ICD-10 Diagnosis Description E11.621 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.528 Non-pressure  chronic ulcer of other part of left foot with other specified  severity [id number] [phone] Type 2 diabetes mellitus with diabetic polyneuropathy Modifier: Physician_5 Quantity: 4 1227.pdf Page 4 of 4 Physician Procedures : CPT4 Code Description Modifier [id number] WC PHYS HYPERBARIC OXYGEN THERAPY ICD-10 Diagnosis Description E11.621 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.528  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

## 2022-06-19 ENCOUNTER — Encounter (HOSPITAL_BASED_OUTPATIENT_CLINIC_OR_DEPARTMENT_OTHER): Payer: Medicare HMO | Admitting: Internal Medicine

## 2022-06-22 ENCOUNTER — Encounter (HOSPITAL_BASED_OUTPATIENT_CLINIC_OR_DEPARTMENT_OTHER): Payer: Medicare HMO | Admitting: Internal Medicine

## 2022-06-22 ENCOUNTER — Telehealth: Payer: Self-pay | Admitting: *Deleted

## 2022-06-22 DIAGNOSIS — M86172 Other acute osteomyelitis, left ankle and foot: Secondary | ICD-10-CM | POA: Diagnosis not present

## 2022-06-22 DIAGNOSIS — L97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 specified severity: Secondary | ICD-10-CM

## 2022-06-22 DIAGNOSIS — E11621 Type 2 diabetes mellitus with foot ulcer: Secondary | ICD-10-CM

## 2022-06-22 DIAGNOSIS — L97522 Non-pressure chronic ulcer of other part of left foot with fat layer exposed: Secondary | ICD-10-CM | POA: Diagnosis not present

## 2022-06-22 LAB — GLUCOSE, CAPILLARY
Glucose-Capillary: 308 mg/dL — ABNORMAL HIGH (ref 70–99)
Glucose-Capillary: 223 mg/dL — ABNORMAL HIGH (ref 70–99)

## 2022-06-22 NOTE — Progress Notes (Addendum)
DAUNTAE, DERUSHA A ([id number]) [phone].pdf Page 1 of 2 Visit Report for 06/22/2022 Arrival Information Details Patient Name: Date of Service: [name], [name] 06/22/2022 10:00 A M Medical Record Number: [id number] Patient Account Number: [id number] Date of Birth/Sex: Treating RN: [birth year] (66 y.o. [name] Primary Care Quyen Cutsforth: [name] Other Clinician: [name] Referring Exavior Kimmons: Treating Marcella Charlson/Extender: [name] in Treatment: 9 Visit Information History Since Last Visit All ordered tests and consults were completed: Yes Patient Arrived: Wheel Chair Added or deleted any medications: No Arrival Time: 09:23 Any new allergies or adverse reactions: No Accompanied By: driver Had a fall or experienced change in No Transfer Assistance: None activities of daily living that may affect Patient Identification Verified: Yes risk of falls: Secondary Verification Process Completed: Yes Signs or symptoms of abuse/neglect since last visito No Patient Requires Transmission-Based Precautions: No Hospitalized since last visit: No Patient Has Alerts: Yes Implantable device outside of the clinic excluding No Patient Alerts: ESBL;MRSA cellular tissue based products placed in the center since last visit: Pain Present Now: No Electronic Signature(s) Signed: 06/22/2022 2:59:29 PM By: Dellie Catholic RN Previous Signature: 06/22/2022 1:57:34 PM Version By: [name] CHT EMT BS , , Previous Signature: 06/22/2022 12:24:26 PM Version By: [name] CHT EMT BS , , Entered By: Dellie Catholic on 06/22/2022 14:53:29 -------------------------------------------------------------------------------- Encounter Discharge Information Details Patient Name: Date of Service: [name], [place] [name] 06/22/2022 10:00 A M Medical Record Number: [id number] Patient Account Number: [id number] Date of Birth/Sex:  Treating RN: 09-May-1956 (66 y.o. [name] Primary Care Maxene Byington: [name] Other Clinician: [name] Referring Lenah Messenger: Treating Khoen Genet/Extender: [name] in Treatment: 9 Encounter Discharge Information Items Discharge Condition: Stable Ambulatory Status: Wheelchair Discharge Destination: Home Transportation: Private Auto Accompanied By: driver Schedule Follow-up Appointment: No Clinical Summary of Care: Electronic Signature(s) Signed: 06/22/2022 1:57:21 PM By: [name] CHT EMT BS , , Entered By: [name] on 06/22/2022 13:57:21 [name] ([id number]) [id number] Page 2 of 2 -------------------------------------------------------------------------------- Vitals Details Patient Name: Date of Service: [name], [name] 06/22/2022 10:00 A M Medical Record Number: [id number] Patient Account Number: [id number] Date of Birth/Sex: Treating RN: 06/02/1956 (66 y.o. [name] Primary Care Shatonya Passon: [name] Other Clinician: [name] Referring Marlita Keil: Treating Travion Ke/Extender: [name], [name] in Treatment: 9 Vital Signs Time Taken: 09:29 Temperature (F): 98.6 Height (in): 72 Pulse (bpm): 85 Weight (lbs): 220 Respiratory Rate (breaths/min): 18 Body Mass Index (BMI): 29.8 Blood Pressure (mmHg): 127/112 Capillary Blood Glucose (mg/dl): 308 Reference Range: 80 - 120 mg / dl Electronic Signature(s) Signed: 06/22/2022 12:26:02 PM By: [name] CHT EMT BS , , Entered By: [name] on 06/22/2022 12:26:02

## 2022-06-22 NOTE — Progress Notes (Signed)
SYLAR, VOONG A ([id number]) [id number] Page 1 of 1 Visit Report for 06/22/2022 SuperBill Details Patient Name: Date of Service: [name], [name] 06/22/2022 Medical Record Number: [id number] Patient Account Number: [id number] Date of Birth/Sex: Treating RN: Jun 02, 1956 (66 y.o. [name] Primary Care Provider: [name] Other Clinician: [name] Referring Provider: Treating Provider/Extender: [name], [name] in Treatment: 9 Diagnosis Coding ICD-10 Codes Code Description [id number] Non-pressure chronic ulcer of other part of left foot with fat layer exposed M86.172 Other acute osteomyelitis, left ankle and foot L97.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 specified severity E11.621 Type 2 diabetes mellitus with foot ulcer E11.42 Type 2 diabetes mellitus with diabetic polyneuropathy Facility Procedures CPT4 Code Description Modifier Quantity [id number] G0277-(Facility Use Only) HBOT full body chamber, 59mn , 4 ICD-10 Diagnosis Description E11.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 M86.172 Other acute osteomyelitis, left ankle and foot Physician Procedures Quantity CPT4 Code Description Modifier [id number] - WC PHYS HYPERBARIC OXYGEN THERAPY 1 ICD-10 Diagnosis Description E11.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 M86.172 Other acute osteomyelitis, left ankle and foot Electronic Signature(s) Signed: 06/22/2022 1:56:27 PM By: SDonavan BurnetCHT EMT BS , , Signed: 06/22/2022 3:43:30 PM By: HKalman Shan DO Entered By: SDonavan Burneton 06/22/2022 13:56:26

## 2022-06-22 NOTE — Telephone Encounter (Signed)
Nurse is calling to update the physician that the patient's wound is being cared for by the wound center, have wrapped and they are not to disturb it at the present, are discharging the patient next week unless they are able to change the wound at that time.

## 2022-06-22 NOTE — Progress Notes (Addendum)
FARZAD, TIBBETTS [name] ([id number]) [id number] Page 1 of 3 Visit Report for 06/22/2022 HBO Details Patient Name: Date of Service: [name], [name]. 06/22/2022 10:00 [name] Medical Record Number: [id number] Patient Account Number: [id number] Date of Birth/Sex: Treating RN: Nov 22, 1955 (66 y.o. [name] Primary Care Shakema Surita: [name] Other Clinician: [name] Referring Aailyah Dunbar: Treating Gladine Plude/Extender: [name] in Treatment: 9 HBO Treatment Course Details Treatment Course Number: 1 Ordering Lateya Dauria: [name] T Treatments Ordered: otal 40 HBO Treatment Start Date: 06/01/2022 HBO Indication: Diabetic Ulcer(s) of the Lower Extremity Standard/Conservative Wound Care tried and failed greater than or equal to 30 days Wound #4 Left, Dorsal Foot , Wound #5 Left, Lateral Foot HBO Treatment Details Treatment Number: 12 Patient Type: Outpatient Chamber Type: Monoplace Chamber Serial #: [id number] Treatment Protocol: 2.0 ATA with 90 minutes oxygen, and no air breaks Treatment Details Compression Rate Down: 1.5 psi / minute De-Compression Rate Up: 1.5 psi / minute [name] breaks and breathing ir Compress Tx Pressure periods Decompress Decompress Begins Reached (leave unused spaces Begins Ends blank) Chamber Pressure (ATA 1 2 - - - - --2 1 ) Clock Time (24 hr) 10:23 10:41 11:11 11:16 11:46 11:51 - - 12:21 12:35 Treatment Length: 132 (minutes) Treatment Segments: 4 Vital Signs Capillary Blood Glucose Reference Range: 80 - 120 mg / dl HBO Diabetic Blood Glucose Intervention Range: <131 mg/dl or >249 mg/dl Time Capillary Blood Glucose Pulse Blood Respiratory Action Type: Vitals Pressure: Pulse: Temperature: Glucose Oximetry Rate: Meter #: Taken: Taken: (mg/dl): (%) Asymptomatic for hyperglycemia, informed Pre 09:29 308 1 physician Pre 10:16 127/112 85 18 98.6 manual BP Pre 10:17 152/82 Post 12:40 152/87  70 18 98 223 1 none per protocol Treatment Response Treatment Toleration: Well Treatment Completion Status: Treatment Completed without Adverse Event Treatment Notes Patient arrived with tympanostomy tubes placed Friday, glucose level taken, 308 mg/dL @ 0929. [name] stated that he ate last evening (egg noodles with chicken) and had Gatorade this morning and did not take insulin this morning. Patient dressed for treatment. Other vitals taken at 1016. BP was out of range per protocol. Performed manual BP; 152/82. Dr. Heber [place] examined patient's ears. Patient cleared for treatment. Chamber was compressed at [name] rate of 1 psi/min until reaching 9psig at 1031 at which point patient denied any issues with ear equalization and rate set was increased to 1.5 psi/min. Patient tolerated treatment and decompression of chamber proceeded at 1.5 psi/min until reaching ambient pressure (1 ATA). Patient denied any issues with ear equalization. Patient was stable upon discharge. Additional Procedure Documentation Tissue Sevierity: Necrosis of bone [name] Notes Patient had tympanostomy tubes placed last week. He has no issues or complaints today. Evaluated ears and noted clear tympanic membrane without signs of irritation to the ears bilaterally. Physician HBO AttestationHOSEY, [name] ([id number]) [id number] Page 2 of 3 I certify that I supervised this HBO treatment in accordance with Medicare guidelines. [name] trained emergency response team is readily available per Yes hospital policies and procedures. Continue HBOT as ordered. Yes Electronic Signature(s) Signed: 06/22/2022 3:43:30 PM By: [name] DO Previous Signature: 06/22/2022 1:59:59 PM Version By: [name] CHT EMT BS , , Previous Signature: 06/22/2022 1:59:18 PM Version By: [name] CHT EMT BS , , Previous Signature: 06/22/2022 1:55:36 PM Version By: [name] CHT EMT BS , , Previous  Signature: 06/22/2022 12:35:52 PM Version By: [name] CHT EMT BS , , Entered By: [name] on 06/22/2022 14:55:11 --------------------------------------------------------------------------------  HBO Safety Checklist Details Patient Name: Date of Service: [name], [name]. 06/22/2022 10:00 [name] Medical Record Number: [id number] Patient Account Number: [id number] Date of Birth/Sex: Treating RN: 10-08-1955 (66 y.o. [name] Primary Care Ledger Heindl: [name] Other Clinician: [name] Referring Jaeanna Mccomber: Treating Nafisah Runions/Extender: [name] in Treatment: 9 HBO Safety Checklist Items Safety Checklist Consent Form Signed Patient voided / foley secured and emptied When did you last eato 0800 Last dose of injectable or oral agent Last evening. Meal Gatorade this AM Ostomy pouch emptied and vented if applicable NA All implantable devices assessed, documented and approved NA Intravenous access site secured and place NA Valuables secured Linens and cotton and cotton/polyester blend (less than 51% polyester) Personal oil-based products / skin lotions / body lotions removed Wigs or hairpieces removed NA Smoking or tobacco materials removed NA Books / newspapers / magazines / loose paper removed Cologne, aftershave, perfume and deodorant removed Jewelry removed (may wrap wedding band) Make-up removed NA Hair care products removed Battery operated devices (external) removed Heating patches and chemical warmers removed Titanium eyewear removed Nail polish cured greater than 10 hours NA Casting material cured greater than 10 hours NA Hearing aids removed NA Loose dentures or partials removed dentures removed Prosthetics have been removed NA Patient demonstrates correct use of air break device (if applicable) Patient concerns have been addressed Patient grounding bracelet on and cord attached to  chamber Specifics for Inpatients (complete in addition to above) Medication sheet sent with patient NA Intravenous medications needed or due during therapy sent with patient NA Drainage tubes (e.g. nasogastric tube or chest tube secured and vented) NA Endotracheal or Tracheotomy tube secured NA Cuff deflated of air and inflated with saline NA Airway suctioned NA Notes Paper version used prior to treatment. Meal Egg Noodles with Chicken last PM, Gatorade this AM. Patient did not take Novolog. Informed Dr. Heber [place]. [name], [name] ([id number]) [id number] Page 3 of 3 Electronic Signature(s) Signed: 06/22/2022 12:28:23 PM By: [name] CHT EMT BS , , Entered By: [name] on 06/22/2022 44:62:86

## 2022-06-23 ENCOUNTER — Encounter (HOSPITAL_BASED_OUTPATIENT_CLINIC_OR_DEPARTMENT_OTHER): Payer: Medicare HMO | Admitting: Internal Medicine

## 2022-06-23 DIAGNOSIS — L97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 specified severity: Secondary | ICD-10-CM

## 2022-06-23 DIAGNOSIS — L97522 Non-pressure chronic ulcer of other part of left foot with fat layer exposed: Secondary | ICD-10-CM

## 2022-06-23 DIAGNOSIS — E11621 Type 2 diabetes mellitus with foot ulcer: Secondary | ICD-10-CM

## 2022-06-23 DIAGNOSIS — E1142 Type 2 diabetes mellitus with diabetic polyneuropathy: Secondary | ICD-10-CM | POA: Diagnosis not present

## 2022-06-23 LAB — GLUCOSE, CAPILLARY
Glucose-Capillary: 217 mg/dL — ABNORMAL HIGH (ref 70–99)
Glucose-Capillary: 104 mg/dL — ABNORMAL HIGH (ref 70–99)

## 2022-06-23 NOTE — Progress Notes (Signed)
LONI, ABDON A ([id number]) [id number] Page 1 of 2 Visit Report for 06/23/2022 Arrival Information Details Patient Name: Date of Service: [name], [name] 06/23/2022 10:00 A M Medical Record Number: [id number] Patient Account Number: [id number] Date of Birth/Sex: Treating RN: [birth year] (66 y.o. [name], [name] Primary Care Tyquavious Gamel: [name] Other Clinician: Valeria Batman Referring Everton Bertha: Treating Tanairi Cypert/Extender: [name] in Treatment: 10 Visit Information History Since Last Visit All ordered tests and consults were completed: Yes Patient Arrived: Wheel Chair Added or deleted any medications: No Arrival Time: 09:09 Any new allergies or adverse reactions: No Accompanied By: None Had a fall or experienced change in No Transfer Assistance: None activities of daily living that may affect Patient Identification Verified: Yes risk of falls: Secondary Verification Process Completed: Yes Signs or symptoms of abuse/neglect since last visito No Patient Requires Transmission-Based Precautions: No Hospitalized since last visit: No Patient Has Alerts: Yes Implantable device outside of the clinic excluding No Patient Alerts: ESBL;MRSA cellular tissue based products placed in the center since last visit: Pain Present Now: No Electronic Signature(s) Signed: 06/23/2022 1:38:48 PM By: Valeria Batman EMT Entered By: Valeria Batman on 06/23/2022 13:38:47 -------------------------------------------------------------------------------- Encounter Discharge Information Details Patient Name: Date of Service: [name], [name] 06/23/2022 10:00 A M Medical Record Number: [id number] Patient Account Number: [id number] Date of Birth/Sex: Treating RN: 10/29/1955 (66 y.o. [name], [name] Primary Care Elanda Garmany: [name] Other Clinician: Valeria Batman Referring Dewie Ahart: Treating [name] Lapoint/Extender: [name] in Treatment: 10 Encounter Discharge Information Items Discharge Condition: Stable Ambulatory Status: Wheelchair Discharge Destination: Home Transportation: Private Auto Accompanied By: Wife Schedule Follow-up Appointment: Yes Clinical Summary of Care: Electronic Signature(s) Signed: 06/23/2022 1:46:15 PM By: Valeria Batman EMT Entered By: Valeria Batman on 06/23/2022 13:46:15 [name] ([id number]) [id number] Page 2 of 2 -------------------------------------------------------------------------------- Vitals Details Patient Name: Date of Service: [name], [name] 06/23/2022 10:00 A M Medical Record Number: [id number] Patient Account Number: [id number] Date of Birth/Sex: Treating RN: 30-Jun-1956 (66 y.o. [name], [name] Primary Care Gentle Hoge: [name] Other Clinician: Valeria Batman Referring Leanna Hamid: Treating Malichi Palardy/Extender: [name], [name] in Treatment: 10 Vital Signs Time Taken: 09:31 Temperature (F): 97.3 Height (in): 72 Pulse (bpm): 92 Weight (lbs): 220 Respiratory Rate (breaths/min): 18 Body Mass Index (BMI): 29.8 Blood Pressure (mmHg): 134/85 Capillary Blood Glucose (mg/dl): 217 Reference Range: 80 - 120 mg / dl Electronic Signature(s) Signed: 06/23/2022 1:39:49 PM By: Valeria Batman EMT Entered By: Valeria Batman on 06/23/2022 13:39:49

## 2022-06-23 NOTE — Progress Notes (Addendum)
BENTLEE, BENNINGFIELD [name] ([id number]) [id number] Page 1 of 2 Visit Report for 06/23/2022 HBO Details Patient Name: Date of Service: [name], [name]. 06/23/2022 10:00 [name] Medical Record Number: [id number] Patient Account Number: [id number] Date of Birth/Sex: Treating RN: Jul 01, 1956 (66 y.o. [name], [name] Primary Care [name]: [name] Other Clinician: Valeria Batman Referring Waddell Iten: Treating [name]: [name] in Treatment: 10 HBO Treatment Course Details Treatment Course Number: 1 Ordering Adeleigh Barletta: [name] T Treatments Ordered: otal 40 HBO Treatment Start Date: 06/01/2022 HBO Indication: Diabetic Ulcer(s) of the Lower Extremity Standard/Conservative Wound Care tried and failed greater than or equal to 30 days Wound #4 Left, Dorsal Foot , Wound #5 Left, Lateral Foot HBO Treatment Details Treatment Number: 13 Patient Type: Outpatient Chamber Type: Monoplace Chamber Serial #: [id number] Treatment Protocol: 2.5 ATA with 90 minutes oxygen, with two 5 minute air breaks Treatment Details Compression Rate Down: 1.5 psi / minute De-Compression Rate Up: 1.5 psi / minute [name] breaks and breathing ir Compress Tx Pressure periods Decompress Decompress Begins Reached (leave unused spaces Begins Ends blank) Chamber Pressure (ATA 1 2.5 2.5 2.5 2.5 2.5 - - 2.5 1 ) Clock Time (24 hr) 10:20 10:36 11:06 11:11 11:41 11:46 - - 12:16 12:31 Treatment Length: 131 (minutes) Treatment Segments: 4 Vital Signs Capillary Blood Glucose Reference Range: 80 - 120 mg / dl HBO Diabetic Blood Glucose Intervention Range: <131 mg/dl or >249 mg/dl Time Vitals Blood Respiratory Capillary Blood Glucose Pulse Action Type: Pulse: Temperature: Taken: Pressure: Rate: Glucose (mg/dl): Meter #: Oximetry (%) Taken: Pre 09:31 134/85 92 18 97.3 217 Post 12:41 131/78 71 18 97.5 104 Treatment Response Treatment Completion Status:  Treatment Completed without Adverse Event Additional Procedure Documentation Tissue Sevierity: Necrosis of bone Physician HBO Attestation: I certify that I supervised this HBO treatment in accordance with Medicare guidelines. [name] trained emergency response team is readily available per Yes hospital policies and procedures. Continue HBOT as ordered. Yes Electronic Signature(s) Signed: 06/23/2022 3:04:03 PM By: [name] DO Previous Signature: 06/23/2022 1:43:04 PM Version By: Valeria Batman EMT Entered By: [name] on 06/23/2022 14:00:37 [name] ([id number]) [id number] Page 2 of 2 -------------------------------------------------------------------------------- HBO Safety Checklist Details Patient Name: Date of Service: [name], [name]. 06/23/2022 10:00 [name] Medical Record Number: [id number] Patient Account Number: [id number] Date of Birth/Sex: Treating RN: 30-Jan-1956 (66 y.o. [name], [name] Primary Care Mackinze Criado: [name] Other Clinician: Valeria Batman Referring [name]: Treating Ferris Fielden/Extender: [name], [name] in Treatment: 10 HBO Safety Checklist Items Safety Checklist Consent Form Signed Patient voided / foley secured and emptied When did you last eato 0745 Last dose of injectable or oral agent 0730 Ostomy pouch emptied and vented if applicable NA All implantable devices assessed, documented and approved Tympanostomy tubes Intravenous access site secured and place NA Valuables secured Linens and cotton and cotton/polyester blend (less than 51% polyester) Personal oil-based products / skin lotions / body lotions removed Wigs or hairpieces removed NA Smoking or tobacco materials removed Books / newspapers / magazines / loose paper removed Cologne, aftershave, perfume and deodorant removed Jewelry removed (may wrap wedding band) Make-up removed NA Hair care products removed Battery  operated devices (external) removed Heating patches and chemical warmers removed Titanium eyewear removed NA Nail polish cured greater than 10 hours NA Casting material cured greater than 10 hours NA Hearing aids removed NA Loose dentures or partials removed removed by patient Prosthetics have been removed NA Patient demonstrates correct  use of air break device (if applicable) Patient concerns have been addressed Patient grounding bracelet on and cord attached to chamber Specifics for Inpatients (complete in addition to above) Medication sheet sent with patient NA Intravenous medications needed or due during therapy sent with patient NA Drainage tubes (e.g. nasogastric tube or chest tube secured and vented) NA Endotracheal or Tracheotomy tube secured NA Cuff deflated of air and inflated with saline NA Airway suctioned NA Notes The safety checklist was done before the treatment was started. Electronic Signature(s) Signed: 06/23/2022 1:41:18 PM By: Valeria Batman EMT Entered By: Valeria Batman on 06/23/2022 13:41:18

## 2022-06-23 NOTE — Progress Notes (Signed)
KUMAR, FALWELL [name] ([id number]) [id number] Page 1 of 2 Visit Report for 06/23/2022 Problem List Details Patient Name: Date of Service: [name], [name]. 06/23/2022 10:00 [name] Medical Record Number: [id number] Patient Account Number: [id number] Date of Birth/Sex: Treating RN: [birth year] (66 y.o. 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 06/23/2022 Medical Record Number: [id number] Patient Account Number: [id number] Date of Birth/Sex: Treating RN: 1955-11-21 (66 y.o. 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.42 Type 2 diabetes mellitus with diabetic polyneuropathy Facility Procedures : CPT4 Code Description: [id number]-(Facility Use Only) HBOT full body chamber, 9mn , ICD-10 Diagnosis Description E11.621 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.522 Non-pressure chronic ulcer of other part of left foot with f L97.528  Non-pressure chronic ulcer of other part of left foot with o E11.42 Type 2 diabetes mellitus with diabetic polyneuropathy Modifier: at layer exposed ther specified se Quantity: 4 verity Physician Procedures : CPT4 Code Description Modifier [id number]- [id number] PHYS HYPERBARIC OXYGEN THERAPY ICD-10 Diagnosis Description E11.621 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

## 2022-06-24 ENCOUNTER — Ambulatory Visit: Payer: Medicare HMO | Admitting: Podiatry

## 2022-06-24 ENCOUNTER — Encounter (HOSPITAL_BASED_OUTPATIENT_CLINIC_OR_DEPARTMENT_OTHER): Payer: Medicare HMO | Admitting: Physician Assistant

## 2022-06-24 DIAGNOSIS — E11621 Type 2 diabetes mellitus with foot ulcer: Secondary | ICD-10-CM | POA: Diagnosis not present

## 2022-06-24 LAB — GLUCOSE, CAPILLARY
Glucose-Capillary: 308 mg/dL — ABNORMAL HIGH (ref 70–99)
Glucose-Capillary: 221 mg/dL — ABNORMAL HIGH (ref 70–99)

## 2022-06-24 NOTE — Progress Notes (Signed)
COEN, MIYASATO [name] ([id number]) [id number] Page 1 of 2 Visit Report for 06/24/2022 HBO Details Patient Name: Date of Service: [name], [name] 06/24/2022 10:00 [name] Medical Record Number: [id number] Patient Account Number: [id number] Date of Birth/Sex: Treating RN: [name], 1956 (66 y.o. [name] Primary Care Naijah Lacek: [name] Other Clinician: [name] Referring Kerrilynn Derenzo: Treating Lexa Coronado/Extender: [name], [name] in Treatment: 10 HBO Treatment Course Details Treatment Course Number: 1 Ordering Tremaine Fuhriman: [name] T Treatments Ordered: otal 40 HBO Treatment Start Date: 06/01/2022 HBO Indication: Diabetic Ulcer(s) of the Lower Extremity Standard/Conservative Wound Care tried and failed greater than or equal to 30 days Wound #4 Left, Dorsal Foot , Wound #5 Left, Lateral Foot HBO Treatment Details Treatment Number: 14 Patient Type: Outpatient Chamber Type: Monoplace Chamber Serial #: [id number] Treatment Protocol: 2.5 ATA with 90 minutes oxygen, with two 5 minute air breaks Treatment Details Compression Rate Down: 1.5 psi / minute De-Compression Rate Up: [name] breaks and breathing ir Compress Tx Pressure periods Decompress Decompress Begins Reached (leave unused spaces Begins Ends blank) Chamber Pressure (ATA 1 2.5 2.5 2.5 2.5 2.5 - - 2.5 1 ) Clock Time (24 hr) 10:12 10:28 10:58 11:03 11:33 11:38 - - 12:08 12:19 Treatment Length: 127 (minutes) Treatment Segments: 4 Vital Signs Capillary Blood Glucose Reference Range: 80 - 120 mg / dl HBO Diabetic Blood Glucose Intervention Range: <131 mg/dl or >249 mg/dl Type: Time Vitals Blood Pulse: Respiratory Capillary Blood Glucose Pulse Action Temperature: Taken: Pressure: Rate: Glucose (mg/dl): Meter #: Oximetry (%) Taken: Pre 10:07 127/80 83 18 97.3 308 1 CBG Reported to Lewanna Petrak Post 12:24 155/82 70 18 97.3 221 1 none per protocol Treatment Response Treatment  Toleration: Well Treatment Completion Status: Treatment Completed without Adverse Event Additional Procedure Documentation Tissue Sevierity: Necrosis of bone Electronic Signature(s) Signed: 06/24/2022 3:03:20 PM By: [name] CHT EMT BS , , Signed: 06/24/2022 6:40:18 PM By: Worthy Keeler PA-C Previous Signature: 06/24/2022 3:03:02 PM Version By: [name] CHT EMT BS , , Entered By: [name] on 06/24/2022 15:03:20 [name] ([id number]) [id number] Page 2 of 2 -------------------------------------------------------------------------------- HBO Safety Checklist Details Patient Name: Date of Service: [name], [name] 06/24/2022 10:00 [name] Medical Record Number: [id number] Patient Account Number: [id number] Date of Birth/Sex: Treating RN: 25-Jun-1956 (66 y.o. [name] Primary Care Feras Gardella: [name] Other Clinician: [name] Referring Reet Scharrer: Treating Marypat Kimmet/Extender: [name], [name] in Treatment: 10 HBO Safety Checklist Items Safety Checklist Consent Form Signed Patient voided / foley secured and emptied When did you last eato 0830 Last dose of injectable or oral agent 0730 Ostomy pouch emptied and vented if applicable NA All implantable devices assessed, documented and approved NA Intravenous access site secured and place NA Valuables secured Linens and cotton and cotton/polyester blend (less than 51% polyester) Personal oil-based products / skin lotions / body lotions removed Wigs or hairpieces removed NA Smoking or tobacco materials removed NA Books / newspapers / magazines / loose paper removed Cologne, aftershave, perfume and deodorant removed Jewelry removed (may wrap wedding band) Make-up removed NA Hair care products removed Battery operated devices (external) removed Heating patches and chemical warmers removed Titanium eyewear removed Nail polish cured  greater than 10 hours NA Casting material cured greater than 10 hours NA Hearing aids removed NA Loose dentures or partials removed dentures removed Prosthetics have been removed NA Patient demonstrates correct use of air break device (if applicable) Patient concerns have  been addressed Patient grounding bracelet on and cord attached to chamber Specifics for Inpatients (complete in addition to above) Medication sheet sent with patient NA Intravenous medications needed or due during therapy sent with patient NA Drainage tubes (e.g. nasogastric tube or chest tube secured and vented) NA Endotracheal or Tracheotomy tube secured NA Cuff deflated of air and inflated with saline NA Airway suctioned NA Notes Paper version used prior to treatment. Electronic Signature(s) Signed: 06/24/2022 3:01:32 PM By: [name] CHT EMT BS , , Entered By: [name] on 06/24/2022 15:01:31

## 2022-06-24 NOTE — Progress Notes (Signed)
SIDHANT, HELDERMAN A ([id number]) [id number] Page 1 of 1 Visit Report for 06/24/2022 Arrival Information Details Patient Name: Date of Service: [name], [name] 06/24/2022 10:00 A M Medical Record Number: [id number] Patient Account Number: [id number] Date of Birth/Sex: Treating RN: Jun 04, 1956 (66 y.o. [name] Primary Care Horice Carrero: [name] Other Clinician: [name] Referring Louella Medaglia: Treating Carley Glendenning/Extender: [name], [name] in Treatment: 10 Visit Information History Since Last Visit All ordered tests and consults were completed: Yes Patient Arrived: Wheel Chair Added or deleted any medications: No Arrival Time: 08:53 Any new allergies or adverse reactions: No Accompanied By: self Had a fall or experienced change in No Transfer Assistance: None activities of daily living that may affect Patient Identification Verified: Yes risk of falls: Secondary Verification Process Completed: Yes Signs or symptoms of abuse/neglect since last visito No Patient Requires Transmission-Based Precautions: No Hospitalized since last visit: No Patient Has Alerts: Yes Implantable device outside of the clinic excluding No Patient Alerts: ESBL;MRSA cellular tissue based products placed in the center since last visit: Pain Present Now: No Electronic Signature(s) Signed: 06/24/2022 3:00:31 PM By: [name] CHT EMT BS , , Previous Signature: 06/24/2022 2:59:32 PM Version By: [name] CHT EMT BS , , Entered By: [name] on 06/24/2022 15:00:31 -------------------------------------------------------------------------------- Vitals Details Patient Name: Date of Service: [name], [name] Lake [name] 06/24/2022 10:00 A M Medical Record Number: [id number] Patient Account Number: [id number] Date of Birth/Sex: Treating RN: 24-Oct-1955 (66 y.o. [name] Primary Care Ettel Albergo: [name] Other Clinician:  [name] Referring Gemini Bunte: Treating Aadit Hagood/Extender: [name], [name] in Treatment: 10 Vital Signs Time Taken: 10:07 Temperature (F): 97.3 Height (in): 72 Pulse (bpm): 83 Weight (lbs): 220 Respiratory Rate (breaths/min): 18 Body Mass Index (BMI): 29.8 Blood Pressure (mmHg): 127/80 Capillary Blood Glucose (mg/dl): 308 Reference Range: 80 - 120 mg / dl Electronic Signature(s) Signed: 06/24/2022 3:00:19 PM By: [name] CHT EMT BS , , Entered By: [name] on 06/24/2022 15:00:19

## 2022-06-25 ENCOUNTER — Encounter (HOSPITAL_BASED_OUTPATIENT_CLINIC_OR_DEPARTMENT_OTHER): Payer: Medicare HMO | Admitting: Internal Medicine

## 2022-06-25 DIAGNOSIS — L97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 specified severity: Secondary | ICD-10-CM | POA: Diagnosis not present

## 2022-06-25 DIAGNOSIS — M86172 Other acute osteomyelitis, left ankle and foot: Secondary | ICD-10-CM | POA: Diagnosis not present

## 2022-06-25 DIAGNOSIS — L97522 Non-pressure chronic ulcer of other part of left foot with fat layer exposed: Secondary | ICD-10-CM | POA: Diagnosis not present

## 2022-06-25 DIAGNOSIS — E11621 Type 2 diabetes mellitus with foot ulcer: Secondary | ICD-10-CM | POA: Diagnosis not present

## 2022-06-25 LAB — GLUCOSE, CAPILLARY
Glucose-Capillary: 126 mg/dL — ABNORMAL HIGH (ref 70–99)
Glucose-Capillary: 132 mg/dL — ABNORMAL HIGH (ref 70–99)

## 2022-06-25 NOTE — Progress Notes (Addendum)
ROBBEN, JAGIELLO A ([id number]) [id number] Page 1 of 2 Visit Report for 06/25/2022 Arrival Information Details Patient Name: Date of Service: [name], [name] 06/25/2022 12:00 PM Medical Record Number: [id number] Patient Account Number: [id number] Date of Birth/Sex: Treating RN: 1956-03-27 (66 y.o. [name], [name] Primary Care Thinh Cuccaro: [name] Other Clinician: Valeria Batman Referring Kiandra Sanguinetti: Treating Mckinley Adelstein/Extender: [name] in Treatment: 10 Visit Information History Since Last Visit All ordered tests and consults were completed: Yes Patient Arrived: Wheel Chair Added or deleted any medications: No Arrival Time: 11:01 Any new allergies or adverse reactions: No Accompanied By: self Had a fall or experienced change in No Transfer Assistance: None activities of daily living that may affect Patient Identification Verified: Yes risk of falls: Secondary Verification Process Completed: Yes Signs or symptoms of abuse/neglect since last visito No Patient Requires Transmission-Based Precautions: No Hospitalized since last visit: No Patient Has Alerts: Yes Implantable device outside of the clinic excluding No Patient Alerts: ESBL;MRSA cellular tissue based products placed in the center since last visit: Pain Present Now: No Electronic Signature(s) Signed: 06/25/2022 3:15:28 PM By: [name] CHT EMT BS , , Previous Signature: 06/25/2022 3:14:20 PM Version By: [name] CHT EMT BS , , Entered By: [name] on 06/25/2022 15:15:28 -------------------------------------------------------------------------------- Encounter Discharge Information Details Patient Name: Date of Service: [name], [name] 06/25/2022 12:00 PM Medical Record Number: [id number] Patient Account Number: [id number] Date of Birth/Sex: Treating RN: Jun 13, 1956 (66 y.o. [name], [name] Primary Care Landrum Carbonell: [name] Other Clinician: [name] Referring Latoy Labriola: Treating Kaspar Albornoz/Extender: [name] in Treatment: 10 Encounter Discharge Information Items Discharge Condition: Stable Ambulatory Status: Wheelchair Discharge Destination: Home Transportation: Private Auto Accompanied By: driver Schedule Follow-up Appointment: No Clinical Summary of Care: Electronic Signature(s) Signed: 06/25/2022 4:34:37 PM By: [name] CHT EMT BS , , Entered By: [name] on 06/25/2022 16:34:37 [name] ([id number]) [id number] Page 2 of 2 -------------------------------------------------------------------------------- Vitals Details Patient Name: Date of Service: [name], [name] 06/25/2022 12:00 PM Medical Record Number: [id number] Patient Account Number: [id number] Date of Birth/Sex: Treating RN: [birth year] (66 y.o. [name], [name] Primary Care Zakyia Gagan: [name] Other Clinician: Valeria Batman Referring Fe Okubo: Treating Isidore Margraf/Extender: [name], [name] in Treatment: 10 Vital Signs Time Taken: 11:26 Temperature (F): 98.2 Height (in): 72 Pulse (bpm): 88 Weight (lbs): 220 Respiratory Rate (breaths/min): 18 Body Mass Index (BMI): 29.8 Blood Pressure (mmHg): 132/77 Reference Range: 80 - 120 mg / dl Electronic Signature(s) Signed: 06/25/2022 3:16:02 PM By: [name] CHT EMT BS , , Entered By: [name] on 06/25/2022 15:16:02

## 2022-06-25 NOTE — Progress Notes (Signed)
NAHOME, BUBLITZ A ([id number]) [id number] Page 1 of 1 Visit Report for 06/24/2022 SuperBill Details Patient Name: Date of Service: [name], [name] 06/24/2022 Medical Record Number: [id number] Patient Account Number: [id number] Date of Birth/Sex: Treating RN: Dec 17, 1955 (66 y.o. [name] Primary Care Provider: [name] Other Clinician: [name] Referring Provider: Treating Provider/Extender: [name], [name] in Treatment: 10 Diagnosis Coding ICD-10 Codes Code Description [phone] Non-pressure chronic ulcer of other part of left foot with fat layer exposed M86.172 Other acute osteomyelitis, left ankle and foot L97.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 specified severity E11.621 Type 2 diabetes mellitus with foot ulcer E11.42 Type 2 diabetes mellitus with diabetic polyneuropathy Facility Procedures CPT4 Code Description Modifier Quantity [id number] G0277-(Facility Use Only) HBOT full body chamber, 3mn , 4 ICD-10 Diagnosis Description E11.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 M86.172 Other acute osteomyelitis, left ankle and foot Physician Procedures Quantity CPT4 Code Description Modifier [id number] - WC PHYS HYPERBARIC OXYGEN THERAPY 1 ICD-10 Diagnosis Description E11.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

## 2022-06-25 NOTE — Progress Notes (Addendum)
DAYVIAN, BLIXT A ([id number]) [id number] Page 1 of 2 Visit Report for 06/25/2022 HBO Details Patient Name: Date of Service: [name], [name] 06/25/2022 12:00 PM Medical Record Number: [id number] Patient Account Number: [id number] Date of Birth/Sex: Treating RN: Dec 12, 1955 (66 y.o. [name], [name] Primary Care Ladesha Pacini: [name] Other Clinician: [name] Referring Yoshi Mancillas: Treating Archibald Marchetta/Extender: [name] in Treatment: 10 HBO Treatment Course Details Treatment Course Number: 1 Ordering Lahoma Constantin: [name] T Treatments Ordered: otal 40 HBO Treatment Start Date: 06/01/2022 HBO Indication: Diabetic Ulcer(s) of the Lower Extremity Standard/Conservative Wound Care tried and failed greater than or equal to 30 days Wound #4 Left, Dorsal Foot , Wound #5 Left, Lateral Foot HBO Treatment Details Treatment Number: 15 Patient Type: Outpatient Chamber Type: Monoplace Chamber Serial #: [id number] Treatment Protocol: 2.5 ATA with 90 minutes oxygen, with two 5 minute air breaks Treatment Details Compression Rate Down: 2.0 psi / minute De-Compression Rate Up: 2.0 psi / minute A breaks and breathing ir Compress Tx Pressure periods Decompress Decompress Begins Reached (leave unused spaces Begins Ends blank) Chamber Pressure (ATA 1 2.5 2.5 2.5 2.5 2.5 - - 2.5 1 ) Clock Time (24 hr) 13:26 13:37 14:07 14:12 14:42 14:47 - - 15:17 15:28 Treatment Length: 122 (minutes) Treatment Segments: 4 Vital Signs Capillary Blood Glucose Reference Range: 80 - 120 mg / dl HBO Diabetic Blood Glucose Intervention Range: <131 mg/dl or >249 mg/dl Type: Time Vitals Blood Pulse: Respiratory Temperature: Capillary Blood Glucose Pulse Action Taken: Pressure: Rate: Glucose (mg/dl): Meter #: Oximetry (%) Taken: Pre 11:26 132/77 88 18 98.2 126 1 Given 8 oz glucerna. Pre 13:18 132 1 cleared for HBO Tx per protocol Post 15:35 149/91 79 18  97.9 81 1 Given 8 oz Glucerna, wait 15 min per protocol. Treatment Response Treatment Toleration: Well Treatment Completion Status: Treatment Completed without Adverse Event Treatment Notes Patient arrived and prepared for treatment. Initial CBG was 126 mg/dL. Patient was given 8 oz Glucerna. After 30 minutes a secondary CBG was taken with a result of 132 mg/dL. Per protocol patient was cleared for treatment. Chamber compressed at a rate of 1 psi/min until reaching 5 psig at which point rate set was increased to 1.5 psi/min. At 10 psig, travel rate was increased to 2 psi/min with patient denying any issues with ear equalization/pain. Patient tolerated treatment at 2.5 ATA well. Chamber decompressed at 2 psi/min with patient tolerating this well. Post-treatment glucose was 81 mg/dL. Patient was given 8 oz Glucerna. At approximately 1600, Patient was stable upon discharge. Patient was given a snack to go with him. Additional Procedure Documentation Tissue Sevierity: Necrosis of bone Physician HBO Attestation: I certify that I supervised this HBO treatment in accordance with Medicare guidelines. A trained emergency response team is readily available per Yes hospital policies and procedures. Continue HBOT as ordered. Yes Electronic Signature(s) KEENE, GILKEY A ([id number]) [id number] Page 2 of 2 Signed: 06/25/2022 4:33:43 PM By: [name] CHT EMT BS , , Signed: 06/30/2022 10:52:01 AM By: [name] DO Previous Signature: 06/25/2022 4:18:37 PM Version By: [name] DO Previous Signature: 06/25/2022 3:29:17 PM Version By: [name] CHT EMT BS , , Entered By: [name] on 06/25/2022 16:33:43 -------------------------------------------------------------------------------- HBO Safety Checklist Details Patient Name: Date of Service: [name], [name] 06/25/2022 12:00 PM Medical Record Number: [id number] Patient Account Number:  [id number] Date of Birth/Sex: Treating RN: 1955/07/27 (66 y.o. [name] Primary Care Redmond Whittley: [name] Other Clinician: [name]  Referring Elleen Coulibaly: Treating Cydney Alvarenga/Extender: [name], [name] in Treatment: 10 HBO Safety Checklist Items Safety Checklist Consent Form Signed Patient voided / foley secured and emptied When did you last eato 0800 Last dose of injectable or oral agent 0730 Ostomy pouch emptied and vented if applicable NA All implantable devices assessed, documented and approved NA Intravenous access site secured and place NA Valuables secured Linens and cotton and cotton/polyester blend (less than 51% polyester) Personal oil-based products / skin lotions / body lotions removed Wigs or hairpieces removed NA Smoking or tobacco materials removed NA Books / newspapers / magazines / loose paper removed Cologne, aftershave, perfume and deodorant removed Jewelry removed (may wrap wedding band) Make-up removed NA Hair care products removed Battery operated devices (external) removed Heating patches and chemical warmers removed Titanium eyewear removed Nail polish cured greater than 10 hours NA Casting material cured greater than 10 hours NA Hearing aids removed NA Loose dentures or partials removed dentures removed Prosthetics have been removed NA Patient demonstrates correct use of air break device (if applicable) Patient concerns have been addressed Patient grounding bracelet on and cord attached to chamber Specifics for Inpatients (complete in addition to above) Medication sheet sent with patient NA Intravenous medications needed or due during therapy sent with patient NA Drainage tubes (e.g. nasogastric tube or chest tube secured and vented) NA Endotracheal or Tracheotomy tube secured NA Cuff deflated of air and inflated with saline NA Airway suctioned NA Notes Paper version used prior to  treatment. Electronic Signature(s) Signed: 06/25/2022 3:24:21 PM By: [name] CHT EMT BS , , Entered By: [name] on 06/25/2022 15:24:21

## 2022-06-26 LAB — GLUCOSE, CAPILLARY: Glucose-Capillary: 81 mg/dL (ref 70–99)

## 2022-06-30 ENCOUNTER — Encounter (HOSPITAL_BASED_OUTPATIENT_CLINIC_OR_DEPARTMENT_OTHER): Payer: Medicare HMO | Admitting: Internal Medicine

## 2022-06-30 DIAGNOSIS — M86172 Other acute osteomyelitis, left ankle and foot: Secondary | ICD-10-CM

## 2022-06-30 DIAGNOSIS — E11621 Type 2 diabetes mellitus with foot ulcer: Secondary | ICD-10-CM

## 2022-06-30 DIAGNOSIS — L97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 specified severity: Secondary | ICD-10-CM

## 2022-06-30 DIAGNOSIS — L97522 Non-pressure chronic ulcer of other part of left foot with fat layer exposed: Secondary | ICD-10-CM | POA: Diagnosis not present

## 2022-06-30 LAB — GLUCOSE, CAPILLARY
Glucose-Capillary: 119 mg/dL — ABNORMAL HIGH (ref 70–99)
Glucose-Capillary: 136 mg/dL — ABNORMAL HIGH (ref 70–99)
Glucose-Capillary: 88 mg/dL (ref 70–99)
Glucose-Capillary: 95 mg/dL (ref 70–99)

## 2022-06-30 NOTE — Progress Notes (Signed)
JAYKE, CAUL A ([id number]) [id number] Page 1 of 2 Visit Report for 06/30/2022 Arrival Information Details Patient Name: Date of Service: [name], [name] 06/30/2022 10:00 A M Medical Record Number: [id number] Patient Account Number: [id number] Date of Birth/Sex: Treating RN: 06-18-1956 (66 y.o. [name] Primary Care Inaya Gillham: [name] Other Clinician: Valeria Batman Referring Phill Steck: Treating Karrah Mangini/Extender: [name] in Treatment: 11 Visit Information History Since Last Visit All ordered tests and consults were completed: Yes Patient Arrived: Wheel Chair Added or deleted any medications: No Arrival Time: 09:02 Any new allergies or adverse reactions: No Accompanied By: Wife Had a fall or experienced change in No Transfer Assistance: None activities of daily living that may affect Patient Identification Verified: Yes risk of falls: Secondary Verification Process Completed: Yes Signs or symptoms of abuse/neglect since last visito No Patient Requires Transmission-Based Precautions: No Hospitalized since last visit: No Patient Has Alerts: Yes Implantable device outside of the clinic excluding No Patient Alerts: ESBL;MRSA cellular tissue based products placed in the center since last visit: Pain Present Now: No Electronic Signature(s) Signed: 06/30/2022 1:41:02 PM By: Valeria Batman EMT Entered By: Valeria Batman on 06/30/2022 13:41:02 -------------------------------------------------------------------------------- Encounter Discharge Information Details Patient Name: Date of Service: [name], [name] 06/30/2022 10:00 A M Medical Record Number: [id number] Patient Account Number: [id number] Date of Birth/Sex: Treating RN: January 05, 1956 (66 y.o. [name] Primary Care Benjamen Koelling: [name] Other Clinician: Valeria Batman Referring Kallan Bischoff: Treating Dashiel Bergquist/Extender: [name] in Treatment: 11 Encounter Discharge Information Items Discharge Condition: Stable Ambulatory Status: Wheelchair Discharge Destination: Home Transportation: Private Auto Accompanied By: Wife Schedule Follow-up Appointment: Yes Clinical Summary of Care: Electronic Signature(s) Signed: 06/30/2022 1:55:55 PM By: Valeria Batman EMT Entered By: Valeria Batman on 06/30/2022 13:55:55 [name] ([id number]) [id number] Page 2 of 2 -------------------------------------------------------------------------------- Vitals Details Patient Name: Date of Service: [name], [name] 06/30/2022 10:00 A M Medical Record Number: [id number] Patient Account Number: [id number] Date of Birth/Sex: Treating RN: 08-Dec-1955 (66 y.o. [name] Primary Care Avery Klingbeil: [name] Other Clinician: Valeria Batman Referring Pio Eatherly: Treating Amran Malter/Extender: [name] in Treatment: 11 Vital Signs Time Taken: 09:20 Capillary Blood Glucose (mg/dl): 119 Height (in): 72 Reference Range: 80 - 120 mg / dl Weight (lbs): 220 Body Mass Index (BMI): 29.8 Electronic Signature(s) Signed: 06/30/2022 1:41:31 PM By: Valeria Batman EMT Entered By: Valeria Batman on 06/30/2022 13:41:31

## 2022-06-30 NOTE — Progress Notes (Addendum)
AMISH, MINTZER A ([id number]) [id number] Page 1 of 2 Visit Report for 06/30/2022 HBO Details Patient Name: Date of Service: [name], [name] 06/30/2022 10:00 A M Medical Record Number: [id number] Patient Account Number: [id number] Date of Birth/Sex: Treating RN: 30-Aug-1955 (66 y.o. [name] Primary Care Layia Walla: [name] Other Clinician: Valeria Batman Referring Henley Boettner: Treating Kyiah Canepa/Extender: [name] in Treatment: 11 HBO Treatment Course Details Treatment Course Number: 1 Ordering Crislyn Willbanks: [name] T Treatments Ordered: otal 40 HBO Treatment Start Date: 06/01/2022 HBO Indication: Diabetic Ulcer(s) of the Lower Extremity Standard/Conservative Wound Care tried and failed greater than or equal to 30 days Wound #4 Left, Dorsal Foot , Wound #5 Left, Lateral Foot HBO Treatment Details Treatment Number: 16 Patient Type: Outpatient Chamber Type: Monoplace Chamber Serial #: [id number] Treatment Protocol: 2.5 ATA with 90 minutes oxygen, with two 5 minute air breaks Treatment Details Compression Rate Down: 2.0 psi / minute De-Compression Rate Up: 2.0 psi / minute A breaks and breathing ir Compress Tx Pressure periods Decompress Decompress Begins Reached (leave unused spaces Begins Ends blank) Chamber Pressure (ATA 1 2.5 2.5 2.5 2.5 2.5 - - 2.5 1 ) Clock Time (24 hr) 10:17 10:30 11:00 11:06 11:36 11:41 - - 21:11 12:23 Treatment Length: 126 (minutes) Treatment Segments: 4 Vital Signs Capillary Blood Glucose Reference Range: 80 - 120 mg / dl HBO Diabetic Blood Glucose Intervention Range: <131 mg/dl or >249 mg/dl Type: Time Vitals Blood Pulse: Respiratory Capillary Blood Glucose Pulse Action Temperature: Taken: Pressure: Rate: Glucose (mg/dl): Meter #: Oximetry (%) Taken: Pre 09:20 119 Patient given 8 oz glucerna Post 12:29 149/92 80 18 97.2 88 Patient given Glucerna. Pre 09:54 112/66 88 116 98  136 Post 12:46 95 Sp[address] Dr[address] Heber [place] Treatment Response Treatment Toleration: Well Treatment Completion Status: Treatment Completed without Adverse Event Treatment Notes On arrival @ 0920 the patient blood sugar was 119. He was given 8oz of Glucerna to drink. At 0954 blood sugar recheck was 136, and treatment was started. Post treatment blood sugar was 88. He was given another 8oz of Glucerna to drink and wailed. Blood sugar recheck at 1246 was 95. Spoke with Dr. Heber  before letting him go home. Additional Procedure Documentation Tissue Sevierity: Necrosis of bone Physician HBO Attestation: I certify that I supervised this HBO treatment in accordance with Medicare guidelines. A trained emergency response team is readily available per Yes hospital policies and procedures. Continue HBOT as ordered. Yes Electronic Signature(s) YEIREN, WHITECOTTON A ([id number]) [id number] Page 2 of 2 Signed: 06/30/2022 3:20:18 PM By: [name] DO Previous Signature: 06/30/2022 1:51:30 PM Version By: Valeria Batman EMT Previous Signature: 06/30/2022 1:47:33 PM Version By: Valeria Batman EMT Entered By: [name] on 06/30/2022 15:18:54 -------------------------------------------------------------------------------- HBO Safety Checklist Details Patient Name: Date of Service: [name], [name] 06/30/2022 10:00 A M Medical Record Number: [id number] Patient Account Number: [id number] Date of Birth/Sex: Treating RN: 1955-07-31 (66 y.o. [name] Primary Care [place] Crance: [name] Other Clinician: Valeria Batman Referring Coner Gibbard: Treating Larson Limones/Extender: [name] in Treatment: 11 HBO Safety Checklist Items Safety Checklist Consent Form Signed Patient voided / foley secured and emptied When did you last eato 0845 Last dose of injectable or oral agent 0800 Ostomy pouch emptied and vented if applicable NA All  implantable devices assessed, documented and approved Tympanostomy tubes Intravenous access site secured and place NA Valuables secured Linens and cotton and cotton/polyester blend (less than 51% polyester) Personal oil-based products /  skin lotions / body lotions removed Wigs or hairpieces removed NA Smoking or tobacco materials removed Books / newspapers / magazines / loose paper removed Cologne, aftershave, perfume and deodorant removed Jewelry removed (may wrap wedding band) Make-up removed NA Hair care products removed Battery operated devices (external) removed Heating patches and chemical warmers removed Titanium eyewear removed NA Nail polish cured greater than 10 hours NA Casting material cured greater than 10 hours NA Hearing aids removed NA Loose dentures or partials removed removed by patient Prosthetics have been removed NA Patient demonstrates correct use of air break device (if applicable) Patient concerns have been addressed Patient grounding bracelet on and cord attached to chamber Specifics for Inpatients (complete in addition to above) Medication sheet sent with patient NA Intravenous medications needed or due during therapy sent with patient NA Drainage tubes (e.g. nasogastric tube or chest tube secured and vented) NA Endotracheal or Tracheotomy tube secured NA Cuff deflated of air and inflated with saline NA Airway suctioned NA Notes The safety checklist was done before the treatment was started. Patient stated that he had an Egg cheese biscuit with jelly and orange juice at 0845 today. Electronic Signature(s) Signed: 06/30/2022 2:04:18 PM By: Valeria Batman EMT Previous Signature: 06/30/2022 1:54:35 PM Version By: Valeria Batman EMT Previous Signature: 06/30/2022 1:42:58 PM Version By: Valeria Batman EMT Entered By: Valeria Batman on 06/30/2022 14:04:17

## 2022-06-30 NOTE — Progress Notes (Signed)
SEIF, TEICHERT A ([id number]) [id number] Page 1 of 1 Visit Report for 06/25/2022 SuperBill Details Patient Name: Date of Service: [name], [name] 06/25/2022 Medical Record Number: [id number] Patient Account Number: [id number] Date of Birth/Sex: Treating RN: Nov 05, 1955 (66 y.o. 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 Facility Procedures CPT4 Code Description Modifier Quantity [id number] G0277-(Facility Use Only) HBOT full body chamber, 47mn , 4 ICD-10 Diagnosis Description E11.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 M86.172 Other acute osteomyelitis, left ankle and foot Physician Procedures Quantity CPT4 Code Description Modifier [id number] - WC PHYS HYPERBARIC OXYGEN THERAPY 1 ICD-10 Diagnosis Description E11.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

## 2022-06-30 NOTE — Progress Notes (Signed)
MURLE, OTTING A ([id number]) [id number] Page 1 of 2 Visit Report for 06/30/2022 Problem List Details Patient Name: Date of Service: [name], [name] 06/30/2022 10:00 A M Medical Record Number: [id number] Patient Account Number: [id number] Date of Birth/Sex: Treating RN: [birth year] (66 y.o. [name] Primary Care Provider: [name] Other Clinician: Valeria Batman Referring Provider: Treating Provider/Extender: [name] in Treatment: 11 Active Problems ICD-10 Encounter Code Description Active Date MDM Diagnosis [phone] Non-pressure chronic ulcer of other part of left foot with fat 04/14/2022 No Yes layer exposed M86.172 Other acute osteomyelitis, left ankle and foot 05/08/2022 No Yes L97.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 04/14/2022 No Yes specified severity E11.621 Type 2 diabetes mellitus with foot ulcer 04/14/2022 No Yes E11.42 Type 2 diabetes mellitus with diabetic polyneuropathy 04/14/2022 No Yes Inactive Problems Resolved Problems Electronic Signature(s) Signed: 06/30/2022 1:55:11 PM By: Valeria Batman EMT Signed: 06/30/2022 3:20:18 PM By: [name] DO Entered By: Valeria Batman on 06/30/2022 13:55:11 -------------------------------------------------------------------------------- SuperBill Details Patient Name: Date of Service: [name], [name] 06/30/2022 Medical Record Number: [id number] Patient Account Number: [id number] Date of Birth/Sex: Treating RN: Aug 01, 1955 (66 y.o. [name] Primary Care Provider: [name] Other Clinician: Valeria Batman Referring Provider: Treating Provider/Extender: [name] in Treatment: [address] 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 Facility Procedures : CPT4 Code Description: [id number] G0277-(Facility Use Only) HBOT full body chamber, 22mn , ICD-10 Diagnosis Description E11.621 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.522 Non-pressure chronic ulcer of other part of left foot with f L97.528  Non-pressure chronic ulcer of other part of left foot with o M86.172 Other acute osteomyelitis, left ankle and foot Modifier: at layer exposed ther specified se Quantity: 4 verity Physician Procedures : CPT4 Code Description Modifier [id number]- WC PHYS HYPERBARIC OXYGEN THERAPY ICD-10 Diagnosis Description E11.621 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

## 2022-07-01 ENCOUNTER — Encounter (HOSPITAL_BASED_OUTPATIENT_CLINIC_OR_DEPARTMENT_OTHER): Payer: Medicare HMO | Admitting: General Surgery

## 2022-07-01 DIAGNOSIS — E11621 Type 2 diabetes mellitus with foot ulcer: Secondary | ICD-10-CM | POA: Diagnosis not present

## 2022-07-01 LAB — GLUCOSE, CAPILLARY
Glucose-Capillary: 165 mg/dL — ABNORMAL HIGH (ref 70–99)
Glucose-Capillary: 73 mg/dL (ref 70–99)

## 2022-07-01 NOTE — Progress Notes (Signed)
[name] ([id number]) [phone].pdf Page 1 of 2 Visit Report for 07/01/2022 Arrival Information Details Patient Name: Date of Service: [name], [name]. 07/01/2022 10:00 [name] Medical Record Number: [id number] Patient Account Number: [id number] Date of Birth/Sex: Treating RN: 1955-08-11 (66 y.o. [name] Primary Care Brayam Boeke: [name] Other Clinician: [name] Referring Lynia Landry: Treating Donelda Mailhot/Extender: Delton See in Treatment: 11 Visit Information History Since Last Visit All ordered tests and consults were completed: Yes Patient Arrived: Wheel Chair Added or deleted any medications: No Arrival Time: 09:03 Any new allergies or adverse reactions: No Accompanied By: self Had [name] fall or experienced change in No Transfer Assistance: None activities of daily living that may affect Patient Identification Verified: Yes risk of falls: Secondary Verification Process Completed: Yes Signs or symptoms of abuse/neglect since last visito No Patient Requires Transmission-Based Precautions: No Hospitalized since last visit: No Patient Has Alerts: Yes Implantable device outside of the clinic excluding No Patient Alerts: ESBL;MRSA cellular tissue based products placed in the center since last visit: Pain Present Now: No Electronic Signature(s) Signed: 07/01/2022 1:38:23 PM By: [name] CHT EMT BS , , Entered By: [name] on 07/01/2022 13:38:23 -------------------------------------------------------------------------------- Encounter Discharge Information Details Patient Name: Date of Service: [name], [name]. 07/01/2022 10:00 [name] Medical Record Number: [id number] Patient Account Number: [id number] Date of Birth/Sex: Treating RN: [birth year] (66 y.o. [name] Primary Care Torry Istre: [name] Other Clinician: [name] Referring Findley Vi: Treating  Val Schiavo/Extender: Delton See in Treatment: 11 Encounter Discharge Information Items Discharge Condition: Stable Ambulatory Status: Wheelchair Discharge Destination: Home Transportation: Private Auto Accompanied By: self Schedule Follow-up Appointment: No Clinical Summary of Care: Electronic Signature(s) Signed: 07/01/2022 1:52:34 PM By: [name] CHT EMT BS , , Entered By: [name] on 07/01/2022 13:52:34 [name] ([id number]) [id number] Page 2 of 2 -------------------------------------------------------------------------------- Vitals Details Patient Name: Date of Service: [name], [name]. 07/01/2022 10:00 [name] Medical Record Number: [id number] Patient Account Number: [id number] Date of Birth/Sex: Treating RN: [birth year] (66 y.o. [name] Primary Care Kellie Murrill: [name] Other Clinician: Valeria Batman Referring Elanah Osmanovic: Treating Chivon Lepage/Extender: Romie Jumper, [name] in Treatment: 11 Vital Signs Time Taken: 09:28 Temperature (F): 97.9 Height (in): 72 Pulse (bpm): 88 Weight (lbs): 220 Respiratory Rate (breaths/min): 16 Body Mass Index (BMI): 29.8 Blood Pressure (mmHg): 114/69 Capillary Blood Glucose (mg/dl): 165 Reference Range: 80 - 120 mg / dl Electronic Signature(s) Signed: 07/01/2022 1:39:18 PM By: [name] CHT EMT BS , , Entered By: [name] on 07/01/2022 13:39:18

## 2022-07-01 NOTE — Progress Notes (Signed)
MARCELO, ICKES A ([id number]) [id number] Page 1 of 1 Visit Report for 07/01/2022 SuperBill Details Patient Name: Date of Service: [name], [name] 07/01/2022 Medical Record Number: [id number] Patient Account Number: [id number] Date of Birth/Sex: Treating RN: [birth year] (66 y.o. 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 Facility Procedures CPT4 Code Description Modifier Quantity [id number] G0277-(Facility Use Only) HBOT full body chamber, 72mn , 4 ICD-10 Diagnosis Description E11.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 M86.172 Other acute osteomyelitis, left ankle and foot Physician Procedures Quantity CPT4 Code Description Modifier [id number] - WC PHYS HYPERBARIC OXYGEN THERAPY 1 ICD-10 Diagnosis Description E11.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

## 2022-07-01 NOTE — Progress Notes (Addendum)
CLAXTON, LEVITZ A ([id number]) [id number] Page 1 of 2 Visit Report for 07/01/2022 HBO Details Patient Name: Date of Service: [name], [name] 07/01/2022 10:00 A M Medical Record Number: [id number] Patient Account Number: [id number] Date of Birth/Sex: Treating RN: 1955/11/27 (66 y.o. [name] Primary Care Hezekiah Veltre: [name] Other Clinician: [name] Referring Jozette Castrellon: Treating Roshard Rezabek/Extender: Delton See in Treatment: 11 HBO Treatment Course Details Treatment Course Number: 1 Ordering Thales Knipple: [name] T Treatments Ordered: otal 40 HBO Treatment Start Date: 06/01/2022 HBO Indication: Diabetic Ulcer(s) of the Lower Extremity Standard/Conservative Wound Care tried and failed greater than or equal to 30 days Wound #4 Left, Dorsal Foot , Wound #5 Left, Lateral Foot HBO Treatment Details Treatment Number: 17 Patient Type: Outpatient Chamber Type: Monoplace Chamber Serial #: [id number] Treatment Protocol: 2.5 ATA with 90 minutes oxygen, with two 5 minute air breaks Treatment Details Compression Rate Down: 1.5 psi / minute De-Compression Rate Up: 2.0 psi / minute A breaks and breathing ir Compress Tx Pressure periods Decompress Decompress Begins Reached (leave unused spaces Begins Ends blank) Chamber Pressure (ATA 1 2.5 2.5 2.5 2.5 2.5 - - 2.5 1 ) Clock Time (24 hr) 10:04 10:20 10:50 10:55 11:25 11:30 - - 12:00 12:12 Treatment Length: 128 (minutes) Treatment Segments: 4 Vital Signs Capillary Blood Glucose Reference Range: 80 - 120 mg / dl HBO Diabetic Blood Glucose Intervention Range: <131 mg/dl or >249 mg/dl Type: Time Vitals Blood Pulse: Respiratory Temperature: Capillary Blood Glucose Pulse Action Taken: Pressure: Rate: Glucose (mg/dl): Meter #: Oximetry (%) Taken: Pre 09:28 114/69 88 16 97.9 165 1 Post 12:18 153/69 65 16 98 73 1 8 oz Glucerna given, waited 15 minutes. Treatment  Response Treatment Toleration: Well Treatment Completion Status: Treatment Completed without Adverse Event Treatment Notes Patient arrived, prepared for treatment after blood glucose check. CBG was 165 mg/dL. Patient stated that he ate egg and cheese biscuit and orange juice. Patient self-administered Afrin. Patient placed in chamber after completing safety check. Pressurized chamber at 1 psi/min until reaching 6 psig at which time patient denied any issues with ear equalization and/or pain. Rate set was increased to 2 psi/min until reaching treatment pressure of 2.5 ATA.Marland Kitchen Patient has PE tubes. Patient tolerated treatment and chamber decompression at the rate of 2 psi/min. Patient's post-treatment blood glucose was 73 mg/dL. Patient was given 8 oz Glucerna and then changed clothes in preparation for discharge. Per protocol, after 15 minutes, Prior to departure patient stated that he felt fine and was asymptomatic for hypoglycemia. Additional Procedure Documentation Tissue Sevierity: Necrosis of bone Physician HBO Attestation: I certify that I supervised this HBO treatment in accordance with Medicare guidelines. A trained emergency response team is readily available per Yes hospital policies and procedures. Continue HBOT as ordered. Yes Electronic Signature(s) AWESOME, JARED A ([id number]) [id number] Page 2 of 2 Signed: 07/01/2022 3:10:03 PM By: [name] Maudlin [name] Previous Signature: 07/01/2022 1:50:26 PM Version By: [name] CHT EMT BS , , Entered By: [name] on 07/01/2022 15:10:02 -------------------------------------------------------------------------------- HBO Safety Checklist Details Patient Name: Date of Service: [name], [name] 07/01/2022 10:00 A M Medical Record Number: [id number] Patient Account Number: [id number] Date of Birth/Sex: Treating RN: 10/04/1955 (66 y.o. [name] Primary Care Mahogony Gilchrest: [name] Other Clinician: Valeria Batman Referring Jru Pense: Treating Daylyn Christine/Extender: Delton See in Treatment: 11 HBO Safety Checklist Items Safety Checklist Consent Form Signed Patient voided / foley secured and emptied When did you  last eato 0645 Last dose of injectable or oral agent 0610 Ostomy pouch emptied and vented if applicable NA All implantable devices assessed, documented and approved NA Intravenous access site secured and place NA Valuables secured Linens and cotton and cotton/polyester blend (less than 51% polyester) Personal oil-based products / skin lotions / body lotions removed Wigs or hairpieces removed NA Smoking or tobacco materials removed NA Books / newspapers / magazines / loose paper removed Cologne, aftershave, perfume and deodorant removed Jewelry removed (may wrap wedding band) Make-up removed NA Hair care products removed Battery operated devices (external) removed Heating patches and chemical warmers removed Titanium eyewear removed Nail polish cured greater than 10 hours NA Casting material cured greater than 10 hours NA Hearing aids removed NA Loose dentures or partials removed dentures removed Prosthetics have been removed NA Patient demonstrates correct use of air break device (if applicable) Patient concerns have been addressed Patient grounding bracelet on and cord attached to chamber Specifics for Inpatients (complete in addition to above) Medication sheet sent with patient NA Intravenous medications needed or due during therapy sent with patient NA Drainage tubes (e.g. nasogastric tube or chest tube secured and vented) NA Endotracheal or Tracheotomy tube secured NA Cuff deflated of air and inflated with saline NA Airway suctioned NA Notes Paper version used prior to treatment. Electronic Signature(s) Signed: 07/01/2022 1:40:54 PM By: [name] CHT EMT BS , , Entered By: [name] on 07/01/2022 13:40:54

## 2022-07-02 ENCOUNTER — Encounter (HOSPITAL_BASED_OUTPATIENT_CLINIC_OR_DEPARTMENT_OTHER): Payer: Medicare HMO | Admitting: Internal Medicine

## 2022-07-02 DIAGNOSIS — L97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 specified severity: Secondary | ICD-10-CM | POA: Diagnosis not present

## 2022-07-02 DIAGNOSIS — E11621 Type 2 diabetes mellitus with foot ulcer: Secondary | ICD-10-CM | POA: Diagnosis not present

## 2022-07-02 DIAGNOSIS — L97522 Non-pressure chronic ulcer of other part of left foot with fat layer exposed: Secondary | ICD-10-CM

## 2022-07-02 DIAGNOSIS — M86172 Other acute osteomyelitis, left ankle and foot: Secondary | ICD-10-CM | POA: Diagnosis not present

## 2022-07-02 LAB — GLUCOSE, CAPILLARY
Glucose-Capillary: 204 mg/dL — ABNORMAL HIGH (ref 70–99)
Glucose-Capillary: 100 mg/dL — ABNORMAL HIGH (ref 70–99)

## 2022-07-02 NOTE — Progress Notes (Addendum)
[name], HOMMEL [name] ([id number]) [id number] Page 1 of 2 Visit Report for 07/02/2022 HBO Details Patient Name: Date of Service: [name], [name] 07/02/2022 11:30 [name] Medical Record Number: [id number] Patient Account Number: [id number] Date of Birth/Sex: Treating RN: [birth year] (66 y.o. [name] Primary Care Lennox Dolberry: [name] Other Clinician: Valeria Batman Referring Skylin Kennerson: Treating Jordane Hisle/Extender: [name] in Treatment: 11 HBO Treatment Course Details Treatment Course Number: 1 Ordering Shafer Swamy: [name] T Treatments Ordered: otal 40 HBO Treatment Start Date: 06/01/2022 HBO Indication: Diabetic Ulcer(s) of the Lower Extremity Standard/Conservative Wound Care tried and failed greater than or equal to 30 days Wound #4 Left, Dorsal Foot , Wound #5 Left, Lateral Foot HBO Treatment Details Treatment Number: 18 Patient Type: Outpatient Chamber Type: Monoplace Chamber Serial #: [id number] Treatment Protocol: 2.5 ATA with 90 minutes oxygen, with two 5 minute air breaks Treatment Details Compression Rate Down: 2.0 psi / minute De-Compression Rate Up: 2.0 psi / minute [name] breaks and breathing ir Compress Tx Pressure periods Decompress Decompress Begins Reached (leave unused spaces Begins Ends blank) Chamber Pressure (ATA 1 2.5 2.5 2.5 2.5 2.5 - - 2.5 1 ) Clock Time (24 hr) 11:29 11:42 12:12 12:17 12:47 12:53 - - 13:23 13:32 Treatment Length: 123 (minutes) Treatment Segments: 4 Vital Signs Capillary Blood Glucose Reference Range: 80 - 120 mg / dl HBO Diabetic Blood Glucose Intervention Range: <131 mg/dl or >249 mg/dl Type: Time Vitals Blood Pulse: Respiratory Temperature: Capillary Blood Glucose Pulse Action Taken: Pressure: Rate: Glucose (mg/dl): Meter #: Oximetry (%) Taken: Pre 11:04 154/84 85 18 97.7 204 Post 13:36 175/86 74 18 97.7 100 Patient given Glucerna. Waited for 104mn after. Treatment  Response Treatment Toleration: Well Treatment Completion Status: Treatment Completed without Adverse Event Additional Procedure Documentation Tissue Sevierity: Necrosis of bone Physician HBO Attestation: I certify that I supervised this HBO treatment in accordance with Medicare guidelines. [name] trained emergency response team is readily available per Yes hospital policies and procedures. Continue HBOT as ordered. Yes Electronic Signature(s) Signed: 07/02/2022 4:27:22 PM By: HKalman ShanDO Previous Signature: 07/02/2022 3:16:11 PM Version By: GValeria BatmanEMT Previous Signature: 07/02/2022 3:15:50 PM Version By: GValeria BatmanEMT Entered By: HKalman Shanon 07/02/2022 16:26:57 TPeri JeffersonA ([id number]) [id number] Page 2 of 2 -------------------------------------------------------------------------------- HBO Safety Checklist Details Patient Name: Date of Service: [name] [name] 07/02/2022 11:30 [name] Medical Record Number: [id number] Account Number: [id number] of Birth/Sex: Treating RN: [birth year](66y.o. MWaldron SessionPrimary Care Chaim Gatley: BRoselee NovaOther Clinician: GValeria BatmanReferring Erminio Nygard: Treating Jenaya Saar/Extender: HBurgess Amorin Treatment: 11 HBO Safety Checklist Items Safety Checklist Consent Form Signed Patient voided / foley secured and emptied When did you last eato 0715 Last dose of injectable or oral agent 0615 Ostomy pouch emptied and vented if applicable NA All implantable devices assessed, documented and approved Tympanostomy tubes Intravenous access site secured and place NA Valuables secured Linens and cotton and cotton/polyester blend (less than 51% polyester) Personal oil-based products / skin lotions / body lotions removed Wigs or hairpieces removed NA Smoking or tobacco materials removed Books / newspapers / magazines / loose paper removed Cologne,  aftershave, perfume and deodorant removed Jewelry removed (may wrap wedding band) Make-up removed NA Hair care products removed Battery operated devices (external) removed Heating patches and chemical warmers removed Titanium eyewear removed NA Nail polish cured greater than 10 hours NA Casting material cured greater than 10 hours NA  Hearing aids removed NA Loose dentures or partials removed removed by patient Prosthetics have been removed NA Patient demonstrates correct use of air break device (if applicable) Patient concerns have been addressed Patient grounding bracelet on and cord attached to chamber Specifics for Inpatients (complete in addition to above) Medication sheet sent with patient NA Intravenous medications needed or due during therapy sent with patient NA Drainage tubes (e.g. nasogastric tube or chest tube secured and vented) NA Endotracheal or Tracheotomy tube secured NA Cuff deflated of air and inflated with saline NA Airway suctioned NA Notes The safety checklist was done before the treatment was started. Electronic Signature(s) Signed: 07/02/2022 3:10:23 PM By: Valeria Batman EMT Entered By: Valeria Batman on 07/02/2022 15:10:23

## 2022-07-02 NOTE — Progress Notes (Signed)
CHESTER, SIBERT [name] ([id number]) [id number] Page 1 of 2 Visit Report for 07/02/2022 Problem List Details Patient Name: Date of Service: [name], [name] 07/02/2022 11:30 [name] Medical Record Number: [id number] Patient Account Number: [id number] Date of Birth/Sex: Treating RN: 1956/02/12 (66 y.o. [name] Primary Care Provider: [name] Other Clinician: Valeria Batman Referring Provider: Treating Provider/Extender: [name] in Treatment: 11 Active Problems ICD-10 Encounter Code Description Active Date MDM Diagnosis [phone] Non-pressure chronic ulcer of other part of left foot with fat 04/14/2022 No Yes layer exposed M86.172 Other acute osteomyelitis, left ankle and foot 05/08/2022 No Yes L97.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 04/14/2022 No Yes specified severity E11.621 Type 2 diabetes mellitus with foot ulcer 04/14/2022 No Yes E11.42 Type 2 diabetes mellitus with diabetic polyneuropathy 04/14/2022 No Yes Inactive Problems Resolved Problems Electronic Signature(s) Signed: 07/02/2022 3:16:49 PM By: Valeria Batman EMT Signed: 07/02/2022 4:27:22 PM By: [name] DO Entered By: Valeria Batman on 07/02/2022 15:16:49 -------------------------------------------------------------------------------- SuperBill Details Patient Name: Date of Service: [name], [name] 07/02/2022 Medical Record Number: [id number] Patient Account Number: [id number] Date of Birth/Sex: Treating RN: Sep 04, 1955 (67 y.o. [name] Primary Care Provider: [name] Other Clinician: Valeria Batman Referring Provider: Treating Provider/Extender: [name] in Treatment: [address] 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 Facility Procedures : CPT4 Code Description: [id number] G0277-(Facility Use Only) HBOT full body chamber, 87mn , ICD-10 Diagnosis Description E11.621 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.522 Non-pressure chronic ulcer of other part of left foot with f L97.528  Non-pressure chronic ulcer of other part of left foot with o M86.172 Other acute osteomyelitis, left ankle and foot Modifier: at layer exposed ther specified se Quantity: 4 verity Physician Procedures : CPT4 Code Description Modifier [phone] PHYS HYPERBARIC OXYGEN THERAPY ICD-10 Diagnosis Description E11.621 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

## 2022-07-02 NOTE — Progress Notes (Signed)
[name], [name] ([id number]) [id number] Page 1 of 9 Visit Report for 07/02/2022 Arrival Information Details Patient Name: Date of Service: [name], [name]. 07/02/2022 10:30 [name] Medical Record Number: [id number] Patient Account Number: [id number] Date of Birth/Sex: Treating [name]: [name] (66 y.o. M) Primary Care [name]: [name] Other Clinician: Referring [name]: Treating [name]/Extender: [name] in Treatment: 11 Visit Information History Since Last Visit Added or deleted any medications: No Patient Arrived: [name] Any new allergies or adverse reactions: No Arrival Time: 09:48 Had [name] fall or experienced change in No Accompanied By: self activities of daily living that may affect Transfer Assistance: None risk of falls: Patient Identification Verified: Yes Signs or symptoms of abuse/neglect since last visito No Secondary Verification Process Completed: Yes Hospitalized since last visit: No Patient Requires Transmission-Based Precautions: No Implantable device outside of the clinic excluding No Patient Has Alerts: Yes cellular tissue based products placed in the center Patient Alerts: ESBL;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 07/02/2022 10:30 [name] Medical Record Number: [id number] Patient Account Number: [id number] Date of Birth/Sex: Treating [name]: [name] (66 y.o. [name], [name] Primary Care [name]: [name] Other Clinician: Referring [name]: Treating [name]/Extender: [name] in Treatment: 11 Encounter Discharge  Information Items Post Procedure Vitals Discharge Condition: Stable Temperature (F): 98.7 Ambulatory Status: Ambulatory Pulse (bpm): 74 Discharge Destination: Home Respiratory Rate (breaths/min): 17 Transportation: Private Auto Blood Pressure (mmHg): 120/80 Accompanied By: self Schedule Follow-up Appointment: Yes Clinical Summary of Care: Patient Declined Electronic Signature(s) Signed: 07/02/2022 4:52:17 PM By: [name] Entered By: [name] on 07/02/2022 10:48:21 [name] ([id number]) [id number] Page 2 of 9 -------------------------------------------------------------------------------- Lower Extremity Assessment Details Patient Name: Date of Service: [name], [name]. 07/02/2022 10:30 [name] Medical Record Number: [id number] Patient Account Number: [id number] Date of Birth/Sex: Treating [name]: [name], [name] (66 y.o. M) Primary Care Ryelynn [name]: [name] Other Clinician: Referring [name]: Treating [name]/Extender: [name], [name] in Treatment: 11 Edema Assessment Assessed: [Left: No] [Right: No] Edema: [Left: Ye] [Right: s] Calf Left: Right: Point of Measurement: 36 cm From Medial Instep 38 cm Ankle Left: Right: Point of Measurement: 9 cm From Medial Instep 23.3 cm Electronic Signature(s) Signed: 07/02/2022 4:51:54 PM By: [name] Entered By: [name] on 07/02/2022 09:59:58 -------------------------------------------------------------------------------- Multi Wound Chart Details Patient Name: Date of Service: [name], [name]. 07/02/2022 10:30 [name] Medical Record Number: [id number] Patient Account Number: [id number] Date of Birth/Sex: Treating [name]: [birth year] (66 y.o. M) Primary Care [name]: [name] Other Clinician: Referring [name]: Treating [name]/Extender: [name], [name] in Treatment: 11 Vital Signs Height(in): 72 Capillary Blood  Glucose(mg/dl): 190 Weight(lbs): 220 Pulse(bpm): 50 Body Mass Index(BMI): 29.8 Blood Pressure(mmHg): 154/84 Temperature(F): 97.7 Respiratory Rate(breaths/min): 18 [4:Photos:] [[name]:[name]] Left, Dorsal Foot Left, Lateral Foot [name] Wound Location: Gradually Appeared Surgical Injury [name] Wounding Event: Diabetic Wound/Ulcer of the Lower Diabetic Wound/Ulcer of the Lower [name] Primary Etiology: Extremity Extremity [name] Open Surgical Wound [name] Secondary Etiology: Asthma, Sleep Apnea, Hypertension, Asthma, Sleep Apnea, Hypertension, [name] Comorbid HistoryISHAQ, [name] [name] ([id number]) [id number] Page 3 of 9 Peripheral Venous Disease, Type II Peripheral Venous Disease, Type II Diabetes, Osteomyelitis, Neuropathy Diabetes, Osteomyelitis, Neuropathy 09/03/2021 12/20/2021 [name] Date Acquired:  11 11 [name] [name]: Open Open [name] Wound Status: No No [name] Wound Recurrence: 0.5x0.2x0.1 5.5x0.5x0.4 [name] Measurements L x W x D (cm) 0.079 2.16 [name] [name] (cm) : rea 0.008 0.864 [name] Volume (cm) : 96.60% 78.40% [name] % Reduction in [name] rea: 98.30% 82.70% [name] % Reduction in Volume: Grade 3 Grade 3 [name] Classification: Medium Medium [name] Exudate [name] mount: Serosanguineous Serosanguineous [name] Exudate Type: red, brown red, brown [name] Exudate Color: No Yes [name] Foul Odor [name] Cleansing: fter [name] No [name] Odor [name] nticipated Due to Product Use: Distinct, outline attached Epibole [name] Wound Margin: Large (67-100%) Large (67-100%) [name] Granulation [name] mount: Pink, Pale Red, Pink [name] Granulation Quality: None Present (0%) Small (1-33%) [name] Necrotic [name] mount: Fat Layer (Subcutaneous Tissue): Yes Fat Layer (Subcutaneous Tissue): Yes [name] Exposed Structures: Fascia: No Bone: Yes Tendon: No Fascia: No Muscle: No Tendon: No Joint: No Muscle: No Bone: No Joint: No Small (1-33%) Small (1-33%) [name] Epithelialization: Debridement - Excisional Debridement - Excisional  [name] Debridement: Pre-procedure Verification/Time Out 10:34 10:34 [name] Taken: Lidocaine Lidocaine [name] Pain Control: Subcutaneous, Slough Subcutaneous, Slough [name] Tissue Debrided: Skin/Subcutaneous Tissue Skin/Subcutaneous Tissue [name] Level: 0.1 2.75 [name] Debridement [name] (sq cm): rea Curette Curette [name] Instrument: Minimum Minimum [name] Bleeding: Pressure Pressure [name] Hemostasis [name] chieved: 0 0 [name] Procedural Pain: 0 0 [name] Post Procedural Pain: Procedure was tolerated well Procedure was tolerated well [name] Debridement Treatment Response: 0.5x0.2x0.1 5.5x0.5x0.4 [name] Post Debridement Measurements L x W x D (cm) 0.008 0.864 [name] Post Debridement Volume: (cm) Excoriation: No Callus: Yes [name] Periwound Skin Texture: Induration: No Excoriation: No Callus: No Induration: No Crepitus: No Crepitus: No Rash: No Rash: No Scarring: No Scarring: No Maceration: No Maceration: No [name] Periwound Skin Moisture: Dry/Scaly: No Dry/Scaly: No Atrophie Blanche: No Atrophie Blanche: No [name] Periwound Skin Color: Cyanosis: No Cyanosis: No Ecchymosis: No Ecchymosis: No Erythema: No Erythema: No Hemosiderin Staining: No Hemosiderin Staining: No Mottled: No Mottled: No Pallor: No Pallor: No Rubor: No Rubor: No No Abnormality No Abnormality [name] Temperature: Cellular or Tissue Based Product Cellular or Tissue Based Product [name] Procedures Performed: Debridement Debridement Treatment Notes Electronic Signature(s) Signed: 07/02/2022 11:33:03 AM By: [name] Entered By: [name] Shan on 07/02/2022 10:45:14 -------------------------------------------------------------------------------- Multi-Disciplinary Care Plan Details Patient Name: Date of Service: [name], [place] [name]. 07/02/2022 10:30 [name] Medical Record Number: [id number] Patient Account Number: [id number] DMETRIUS, AMBS ([id number]) [id number] Page 4 of 9 Date of Birth/Sex: Treating  [name]: [name], [name] (66 y.o. [name], [name] Primary Care Sruti Ayllon: Other Clinician: Roselee [name] Referring Marque Rademaker: Treating Lunabelle Oatley/Extender: [name] in Treatment: 11 Active Inactive Osteomyelitis Nursing Diagnoses: Infection: osteomyelitis Goals: Diagnostic evaluation for osteomyelitis completed as ordered Date Initiated: 04/23/2022 Target Resolution Date: 07/03/2022 Goal Status: Active Signs and symptoms for osteomyelitis will be recognized and promptly addressed Date Initiated: 04/23/2022 Target Resolution Date: 07/04/2022 Goal Status: Active Interventions: Assess for signs and symptoms of osteomyelitis resolution every visit Provide education on osteomyelitis Screen for HBO Treatment Activities: Biopsy : 04/16/2022 Consult for HBO : 04/23/2022 Surgical debridement : 04/23/2022 Systemic antibiotics : 04/23/2022 T ordered outside of clinic : 04/23/2022 est Notes: Wound/Skin Impairment Nursing Diagnoses: Knowledge deficit related to ulceration/compromised skin integrity Goals: Patient/caregiver will verbalize understanding of skin care regimen Date Initiated: 04/14/2022 Target Resolution Date: 07/04/2022 Goal Status: Active Interventions: Assess patient/caregiver ability to perform ulcer/skin care regimen upon admission and as needed Assess ulceration(s) every visit Provide education on ulcer and skin care Notes: Electronic  Signature(s) Signed: 07/02/2022 4:52:17 PM By: [name] Entered By: [name] on 07/02/2022 10:37:57 -------------------------------------------------------------------------------- Pain Assessment Details Patient Name: Date of Service: [name], [name]. 07/02/2022 10:30 [name] Medical Record Number: [id number] Patient Account Number: [id number] Date of Birth/Sex: Treating [name]: [name] (66 y.o. M) Primary Care Colden Samaras: [name] Other Clinician: Referring Milus Fritze: Treating  [name]: [name] in Treatment: [address] CHANDLAR, GUICE [name] ([id number]) [id number] Page 5 of 9 Location of Pain Severity and Description of Pain Patient Has Paino No Site Locations Pain Management and Medication Current Pain Management: Electronic Signature(s) Signed: 07/02/2022 4:51:54 PM By: [name] Entered By: [name] on 07/02/2022 09:59:44 -------------------------------------------------------------------------------- Patient/Caregiver Education Details Patient Name: Date of Service: [name], [name]. 12/28/2023andnbsp10:30 [name] Medical Record Number: [id number] Patient Account Number: [id number] Date of Birth/Gender: Treating [name]: [name] (66 y.o. [name] Primary Care Physician: [name] Other Clinician: Referring Physician: Treating Physician/Extender: [name] in Treatment: 11 Education Assessment Education Provided To: Patient Education Topics Provided Wound/Skin Impairment: Methods: Explain/Verbal Responses: Reinforcements needed, State content correctly [employer]) Signed: 07/02/2022 4:52:17 PM By: [name] Entered By: [name] on 07/02/2022 10:38:09 -------------------------------------------------------------------------------- Wound Assessment Details Patient Name: Date of Service: [name], [name]. 07/02/2022 10:30 [name] YOUSIF, EDELSON [name] ([id number]) [id number] Page 6 of 9 Medical Record Number: [id number] Patient Account Number: [id number] Date of Birth/Sex: Treating [name]: [name] (66 y.o. M) Primary Care Sriansh Farra: [name] Other Clinician: Referring Zarion Oliff: Treating Nikya Busler/Extender: [name], [name] in Treatment: 11 Wound Status Wound Number: 4 Primary Diabetic Wound/Ulcer of the Lower Extremity Etiology: Wound Location:  Left, Dorsal Foot Wound Open Wounding Event: Gradually Appeared Status: Date Acquired: 09/03/2021 Comorbid Asthma, Sleep Apnea, Hypertension, Peripheral Venous Disease, [name]: 11 History: Type II Diabetes, Osteomyelitis, Neuropathy Clustered Wound: No Photos Wound Measurements Length: (cm) 0.5 Width: (cm) 0.2 Depth: (cm) 0.1 Area: (cm) 0.079 Volume: (cm) 0.008 % Reduction in Area: 96.6% % Reduction in Volume: 98.3% Epithelialization: Small (1-33%) Tunneling: No Undermining: No Wound Description Classification: Grade 3 Wound Margin: Distinct, outline attached Exudate Amount: Medium Exudate Type: Serosanguineous Exudate Color: red, brown Foul Odor After Cleansing: No Slough/Fibrino Yes Wound Bed Granulation Amount: Large (67-100%) Exposed Structure Granulation Quality: Pink, Pale Fascia Exposed: No Necrotic Amount: None Present (0%) Fat Layer (Subcutaneous Tissue) Exposed: Yes Tendon Exposed: No Muscle Exposed: No Joint Exposed: No Bone Exposed: No Periwound Skin Texture Texture Color No Abnormalities Noted: No No Abnormalities Noted: Yes Callus: No Temperature / Pain Crepitus: No Temperature: No Abnormality Excoriation: No Induration: No Rash: No Scarring: No Moisture No Abnormalities Noted: Yes Treatment Notes Wound #4 (Foot) Wound Laterality: Dorsal, Left Cleanser Wound Cleanser Discharge Instruction: Cleanse the wound with wound cleanser prior to applying [name] clean dressing using gauze sponges, not tissue or cotton balls. Peri-Wound Care Skin Prep LISANDRO, MEGGETT [name] ([id number]) [phone].pdf Page 7 of 9 Discharge Instruction: Use skin prep as directed Topical Primary Dressing Grafix Discharge Instruction: Secure with adaptic and steri strips Secondary Dressing ABD Pad, 8x10 Discharge Instruction: Apply over primary dressing as directed. Secured With [employer], 4.5x3.1 (in/yd) Discharge Instruction:  Secure with Kerlix as directed. 4M Medipore H Soft Cloth Surgical T ape, 4 x 10 (in/yd) Discharge Instruction: Secure with tape as directed. Compression Wrap Compression Stockings Add-Ons Electronic Signature(s) Signed: 07/02/2022 4:51:54 PM By: [name] Entered By: [name] on 07/02/2022 10:02:27 -------------------------------------------------------------------------------- Wound Assessment Details  Patient Name: Date of Service: [name], [name]. 07/02/2022 10:30 [name] Medical Record Number: [id number] Patient Account Number: [id number] Date of Birth/Sex: Treating [name]: [name] (66 y.o. M) Primary Care Infantof Villagomez: [name] Other Clinician: Referring Shoshannah Faubert: Treating Kingsly Kloepfer/Extender: [name], [name] in Treatment: 11 Wound Status Wound Number: 5 Primary Diabetic Wound/Ulcer of the Lower Extremity Etiology: Wound Location: Left, Lateral Foot Secondary Open Surgical Wound Wounding Event: Surgical Injury Etiology: Date Acquired: 12/20/2021 Wound Open [name]: 11 Status: Clustered Wound: No Comorbid Asthma, Sleep Apnea, Hypertension, Peripheral Venous Disease, History: Type II Diabetes, Osteomyelitis, Neuropathy Photos Wound Measurements Length: (cm) 5.5 Width: (cm) 0.5 Depth: (cm) 0.4 Area: (cm) 2.16 Volume: (cm) 0.864 Mcnally, Swayze [name] ([id number]) % Reduction in Area: 78.4% % Reduction in Volume: 82.7% Epithelialization: Small (1-33%) Tunneling: No Undermining: No [id number] Page 8 of 9 Wound Description Classification: Grade 3 Wound Margin: Epibole Exudate Amount: Medium Exudate Type: Serosanguineous Exudate Color: red, brown Foul Odor After Cleansing: Yes Due to Product Use: No Slough/Fibrino Yes Wound Bed Granulation Amount: Large (67-100%) Exposed Structure Granulation Quality: Red, Pink Fascia Exposed: No Necrotic Amount: Small (1-33%) Fat Layer (Subcutaneous Tissue)  Exposed: Yes Necrotic Quality: Adherent Slough Tendon Exposed: No Muscle Exposed: No Joint Exposed: No Bone Exposed: Yes Periwound Skin Texture Texture Color No Abnormalities Noted: No No Abnormalities Noted: Yes Callus: Yes Temperature / Pain Crepitus: No Temperature: No Abnormality Excoriation: No Induration: No Rash: No Scarring: No Moisture No Abnormalities Noted: No Dry / Scaly: No Maceration: No Treatment Notes Wound #5 (Foot) Wound Laterality: Left, Lateral Cleanser Wound Cleanser Discharge Instruction: Cleanse the wound with wound cleanser prior to applying [name] clean dressing using gauze sponges, not tissue or cotton balls. Peri-Wound Care Skin Prep Discharge Instruction: Use skin prep as directed Topical Primary Dressing Grafix Secondary Dressing ABD Pad, 8x10 Discharge Instruction: Apply over primary dressing as directed. Secured With [employer], 4.5x3.1 (in/yd) Discharge Instruction: Secure with Kerlix as directed. 48M Medipore H Soft Cloth Surgical T ape, 4 x 10 (in/yd) Discharge Instruction: Secure with tape as directed. Compression Wrap Compression Stockings Add-Ons Electronic Signature(s) Signed: 07/02/2022 4:51:54 PM By: [name] Entered By: [name] on 07/02/2022 10:03:12 [name] ([id number]) [id number] Page 9 of 9 -------------------------------------------------------------------------------- Vitals Details Patient Name: Date of Service: [name], [name]. 07/02/2022 10:30 [name] Medical Record Number: [id number] Patient Account Number: [id number] Date of Birth/Sex: Treating [name]: [birth year] [name] (66 y.o. M) Primary Care Joyann Spidle: [name] Other Clinician: Referring Ruthvik Barnaby: Treating Kenlei Safi/Extender: [name], [name] in Treatment: 11 Vital Signs Time Taken: 09:50 Temperature (F): 97.7 Height (in): 72 Pulse (bpm): 85 Weight (lbs): 220 Respiratory Rate  (breaths/min): 18 Body Mass Index (BMI): 29.8 Blood Pressure (mmHg): 154/84 Capillary Blood Glucose (mg/dl): 190 Reference Range: 80 - 120 mg / dl Electronic Signature(s) Signed: 07/02/2022 4:51:54 PM By: [name] Entered By: [name] on 07/02/2022 09:51:21

## 2022-07-02 NOTE — Progress Notes (Signed)
[name], NIEBLAS [name] ([id number]) [id number] Page 1 of 2 Visit Report for 07/02/2022 Arrival Information Details Patient Name: Date of Service: [name], [name] 07/02/2022 11:30 [name] Medical Record Number: [id number] Patient Account Number: [id number] Date of Birth/Sex: Treating RN: 03/08/1956 (66 y.o. [name] Primary Care Joscelin Fray: [name] Other Clinician: Valeria Batman Referring Jing Howatt: Treating Flecia Shutter/Extender: [name] in Treatment: 11 Visit Information History Since Last Visit All ordered tests and consults were completed: Yes Patient Arrived: Wheel Chair Added or deleted any medications: No Arrival Time: 11:00 Any new allergies or adverse reactions: No Accompanied By: None Had [name] fall or experienced change in No Transfer Assistance: None activities of daily living that may affect Patient Identification Verified: Yes risk of falls: Secondary Verification Process Completed: Yes Signs or symptoms of abuse/neglect since last visito No Patient Requires Transmission-Based Precautions: No Hospitalized since last visit: No Patient Has Alerts: Yes Implantable device outside of the clinic excluding No Patient Alerts: ESBL;MRSA cellular tissue based products placed in the center since last visit: Pain Present Now: No Electronic Signature(s) Signed: 07/02/2022 2:30:02 PM By: Valeria Batman EMT Previous Signature: 07/02/2022 2:29:28 PM Version By: Valeria Batman EMT Entered By: Valeria Batman on 07/02/2022 14:30:02 -------------------------------------------------------------------------------- Encounter Discharge Information Details Patient Name: Date of Service: [name], [name] 07/02/2022 11:30 [name] Medical Record Number: [id number] Patient Account Number: [id number] Date of Birth/Sex: Treating RN: September 02, 1955 (66 y.o. [name] Primary Care Nosson Wender: [name] Other Clinician: Valeria Batman Referring Adia Crammer: Treating Zaira Iacovelli/Extender: [name] in Treatment: 11 Encounter Discharge Information Items Discharge Condition: Stable Ambulatory Status: Wheelchair Discharge Destination: Home Transportation: Private Auto Accompanied By: None Schedule Follow-up Appointment: Yes Clinical Summary of Care: Electronic Signature(s) Signed: 07/02/2022 3:17:24 PM By: Valeria Batman EMT Entered By: Valeria Batman on 07/02/2022 15:17:24 [name] ([id number]) [id number] Page 2 of 2 -------------------------------------------------------------------------------- Vitals Details Patient Name: Date of Service: [name], [name] 07/02/2022 11:30 [name] Medical Record Number: [id number] Patient Account Number: [id number] Date of Birth/Sex: Treating RN: [birth year] (66 y.o. [name] Primary Care [name]: [name] Other Clinician: Valeria Batman Referring Niketa Turner: Treating Emmory Solivan/Extender: [name] in Treatment: 11 Vital Signs Time Taken: 11:04 Temperature (F): 97.7 Height (in): 72 Pulse (bpm): 85 Weight (lbs): 220 Respiratory Rate (breaths/min): 18 Body Mass Index (BMI): 29.8 Blood Pressure (mmHg): 154/84 Capillary Blood Glucose (mg/dl): 204 Reference Range: 80 - 120 mg / dl Electronic Signature(s) Signed: 07/02/2022 2:30:36 PM By: Valeria Batman EMT Entered By: Valeria Batman on 07/02/2022 14:30:35

## 2022-07-02 NOTE — Progress Notes (Signed)
[name], [name] ([id number]) [id number] Page 1 of 14 Visit Report for 07/02/2022 Chief Complaint Document Details Patient Name: Date of Service: [name], [name]. 07/02/2022 10:30 [name] Medical Record Number: [id number] Patient Account Number: [id number] Date of Birth/Sex: Treating [name]: 1955-11-30 (66 y.o. M) Primary Care Provider: Roselee [name] Other Clinician: Referring Provider: Treating Provider/Extender: [name], [name] in Treatment: 11 Information Obtained from: Patient Chief Complaint 10/16/20; patient is here with [name] substantial wound on his right foot in the setting of [name] recent transmetatarsal amputation. 04/14/2022; referral from podiatry for postsurgical wound of nonhealing partial left fifth ray amputation Electronic Signature(s) Signed: 07/02/2022 11:33:03 AM By: [name] Shan DO Entered By: [name] on 07/02/2022 10:45:26 -------------------------------------------------------------------------------- Cellular or Tissue Based Product Details Patient Name: Date of Service: [name], [name]. 07/02/2022 10:30 [name] Medical Record Number: [id number] Patient Account Number: [id number] Date of Birth/Sex: Treating [name]: [birth year] (66 y.o. [name], [name] Primary Care Provider: Roselee [name] Other Clinician: Referring Provider: Treating Provider/Extender: [name] in Treatment: 11 Cellular or Tissue Based Product Type Wound #4 Left,Dorsal Foot Applied to: Performed By: Physician [name] Shan, DO Cellular or Tissue Based Product Type: Grafix prime Level of Consciousness (Pre-procedure): Awake and Alert Pre-procedure Verification/Time Out Yes - 10:40 Taken: Location: genitalia / hands / feet / multiple digits Wound Size (sq cm): 0.1 Product Size (sq cm): 6 Waste Size (sq cm): 5 Waste Reason: wound size; applied to other wound Amount of Product Applied (sq cm): 1 Instrument  Used: Forceps, Scissors Lot #: [id number] Expiration Date: 11/18/2023 Fenestrated: No Reconstituted: Yes Solution Type: NS Solution Amount: 3 ml Lot #: [id number] Solution Expiration Date: 11/02/2024 Secured: Yes Secured With: Steri-Strips Dressing Applied: Yes Primary Dressing: adaptic Procedural Pain: 0 Post Procedural Pain: 0 Response to Treatment: Procedure was tolerated well [name], [name] ([id number]) [id number] Page 2 of 14 Level of Consciousness (Post- Awake and Alert procedure): Post Procedure Diagnosis Same as Pre-procedure Electronic Signature(s) Signed: 07/02/2022 11:33:03 AM By: [name] Shan DO Signed: 07/02/2022 4:52:17 PM By: [name] Entered By: [name] on 07/02/2022 10:40:20 -------------------------------------------------------------------------------- Cellular or Tissue Based Product Details Patient Name: Date of Service: [name], [name]. 07/02/2022 10:30 [name] Medical Record Number: [id number] Patient Account Number: [id number] Date of Birth/Sex: Treating [name]: 03/11/1956 (66 y.o. [name], [name] Primary Care Provider: Roselee [name] Other Clinician: Referring Provider: Treating Provider/Extender: [name] in Treatment: 11 Cellular or Tissue Based Product Type Wound #5 Left,Lateral Foot Applied to: Performed By: Physician [name] Shan, DO Cellular or Tissue Based Product Type: Grafix prime Level of Consciousness (Pre-procedure): Awake and Alert Pre-procedure Verification/Time Out Yes - 10:40 Taken: Location: genitalia / hands / feet / multiple digits Wound Size (sq cm): 2.75 Product Size (sq cm): 5 Waste Size (sq cm): 0 Amount of Product Applied (sq cm): 5 Instrument Used: Forceps, Scissors Lot #: [id number] Expiration Date: 11/18/2023 Fenestrated: No Reconstituted: Yes Solution Type: NS Solution Amount: 3 ml Lot #: [id number] Solution Expiration Date:  11/02/2024 Secured: Yes Secured With: Steri-Strips Dressing Applied: Yes Primary Dressing: adaptic Procedural Pain: 0 Post Procedural Pain: 0 Response to Treatment: Procedure was tolerated well Level of Consciousness (Post- Awake and Alert procedure): Post Procedure Diagnosis Same as Pre-procedure Electronic Signature(s) Signed: 07/02/2022 11:33:03 AM By: [name] Shan DO Signed: 07/02/2022 4:52:17 PM By: [name] Entered By: [name] on 07/02/2022 10:40:58 [name] ([id number]) [id number] Page 3 of 14 --------------------------------------------------------------------------------  Debridement Details Patient Name: Date of Service: [name], [name]. 07/02/2022 10:30 [name] Medical Record Number: [id number] Patient Account Number: [id number] Date of Birth/Sex: Treating [name]: 16-Oct-1955 (66 y.o. [name], [name] Primary Care Provider: Roselee [name] Other Clinician: Referring Provider: Treating Provider/Extender: [name] in Treatment: 11 Debridement Performed for Assessment: Wound #4 Left,Dorsal Foot Performed By: Physician [name] Shan, DO Debridement Type: Debridement Severity of Tissue Pre Debridement: Fat layer exposed Level of Consciousness (Pre-procedure): Awake and Alert Pre-procedure Verification/Time Out Yes - 10:34 Taken: Start Time: 10:34 Pain Control: Lidocaine T Area Debrided (L x W): otal 0.5 (cm) x 0.2 (cm) = 0.1 (cm) Tissue and other material debrided: Viable, Non-Viable, Slough, Subcutaneous, Slough Level: Skin/Subcutaneous Tissue Debridement Description: Excisional Instrument: Curette Bleeding: Minimum Hemostasis Achieved: Pressure End Time: 10:34 Procedural Pain: 0 Post Procedural Pain: 0 Response to Treatment: Procedure was tolerated well Level of Consciousness (Post- Awake and Alert procedure): Post Debridement Measurements of Total Wound Length: (cm)  0.5 Width: (cm) 0.2 Depth: (cm) 0.1 Volume: (cm) 0.008 Character of Wound/Ulcer Post Debridement: Improved Severity of Tissue Post Debridement: Fat layer exposed Post Procedure Diagnosis Same as Pre-procedure Electronic Signature(s) Signed: 07/02/2022 11:33:03 AM By: [name] Shan DO Signed: 07/02/2022 4:52:17 PM By: [name] Entered By: [name] on 07/02/2022 10:36:10 -------------------------------------------------------------------------------- Debridement Details Patient Name: Date of Service: [name], [name]. 07/02/2022 10:30 [name] Medical Record Number: [id number] Patient Account Number: [id number] Date of Birth/Sex: Treating [name]: September 04, 1955 (66 y.o. [name], [name] Primary Care Provider: Roselee [name] Other Clinician: Referring Provider: Treating Provider/Extender: [name] in Treatment: 11 Debridement Performed for Assessment: Wound #5 Left,Lateral Foot Performed By: Physician [name] Shan, DO Debridement Type: Debridement Severity of Tissue Pre Debridement: Fat layer exposed Level of Consciousness (Pre-procedure): Awake and Alert Pre-procedure Verification/Time Out Yes - 10:34 Taken: Start Time: 10:34 Pain Control: Lidocaine T Area Debrided (L x W): otal 5.5 (cm) x 0.5 (cm) = 2.75 (cm) Tissue and other material debrided: Viable, Non-Viable, Slough, Subcutaneous, Slough Level: Skin/Subcutaneous Tissue [name], [name] ([id number]) [id number] Page 4 of 14 Debridement Description: Excisional Instrument: Curette Bleeding: Minimum Hemostasis Achieved: Pressure End Time: 10:34 Procedural Pain: 0 Post Procedural Pain: 0 Response to Treatment: Procedure was tolerated well Level of Consciousness (Post- Awake and Alert procedure): Post Debridement Measurements of Total Wound Length: (cm) 5.5 Width: (cm) 0.5 Depth: (cm) 0.4 Volume: (cm) 0.864 Character of Wound/Ulcer Post  Debridement: Improved Severity of Tissue Post Debridement: Fat layer exposed Post Procedure Diagnosis Same as Pre-procedure Electronic Signature(s) Signed: 07/02/2022 11:33:03 AM By: [name] Shan DO Signed: 07/02/2022 4:52:17 PM By: [name] Entered By: [name] on 07/02/2022 10:36:53 -------------------------------------------------------------------------------- HPI Details Patient Name: Date of Service: [name], [name]. 07/02/2022 10:30 [name] Medical Record Number: [id number] Patient Account Number: [id number] Date of Birth/Sex: Treating [name]: 13-Jan-1956 (66 y.o. M) Primary Care Provider: Roselee [name] Other Clinician: Referring Provider: Treating Provider/Extender: [name], [name] in Treatment: 11 History of Present Illness HPI Description: ADMISSION 10/16/20 This is [name] 66 year old man who is [name] type II diabetic. Initially seen by Dr. Unk Lightning of vein and vascular on 09/10/2021 for bilateral lower extremity rest pain right greater than left. His ABIs demonstrated monophasic waveforms at the ankles bilaterally with depressed toe pressures. His ABI in the right was 0.5 and on the left 0.28. There were no obtainable waveforms for TBI's. He underwent angiography on 09/10/2021 and had findings of severe  PAD with 95% stenosis of the distal SFA. He also had peroneal and posterior tibial arteries occluded with significant collateralization in the calf there was reconstitution of the posterior tibial artery in the distal third of the calf. Ostia of the anterior tibial artery was not appreciated. He underwent [name] angioplasty of the superficial femoral artery. He required admission to hospital from 09/12/2021 through 09/22/2021 with right foot cellulitis and sepsis. On 3/13 he underwent [name] right below-knee popliteal to posterior tibial bypass using [name] greater saphenous vein. Ultimately however he required [name] right TMA by podiatry which I believe was done on 3/17  for progressive diabetic foot infection. He was discharged to [facility] skilled facility. He arrives in clinic today with [name] large wound area over the entirety of his amputation site extending proximally. The attempted flap closure of the amputation site and his TMA has failed and the wound extends under the attempt to closure. Still some sutures in place. There is an odor but he is not systemically unwell. He is due for follow-up arterial studies and has an appointment with vascular surgery on 10/28/2021. They are using wet-to-dry dressings at [facility]. Past medical history includes type 2 diabetes with peripheral neuropathy, hypertension, obstructive sleep apnea 4/20; patient presents for follow-up. He is being discharged from his facility at [facility] today. They have been doing Dakin's wet-to-dry dressings. He states that his fiance can help with dressing changes. He is getting [facility] home [facility] to come out 3 times [name] week once he leaves the facility. He is scheduled to see vein and vascular on 5/12. He has not seen the wound himself. He puts covers over his face for wound exams. He currently denies systemic signs of infection. 4/28; patient admitted to the clinic 2 weeks ago with [name] dehisced right TMA in the setting of type 2 diabetes with significant PAD. He is status post revascularization. He has been discharged from the nursing home he was at and now is at home using Dakin's wet-to-dry dressings daily he has home health. He denies any systemic signs of infection 5/4; patient presents for follow-up. His fiance and home health have been changing his dressings. He currently denies systemic signs of infection. He continues to put sheet covers over his face during our wound encounters because he does not want [name] look at the wound. 5/25; Patient presents for follow-up. He missed his last clinic appointment. He had [name] bone biopsy and culture done at last clinic visit that showed acute osteomyelitis  with growth of E. coli, stenotrophomonas maltophilia and enteroccocus faecalis. [name] referral to infectious disease was made. He has not heard from them yet. He has been using Dakin's wet-to-dry dressings. He is now more comfortable with looking at the wound bed. [name], [name] ([id number]) [id number] Page 5 of 14 6/6; the patient comes into clinic today with 2 wounds on the left foot which apparently have been there for several months. This was not known to assess but was known by Dr. Donzetta Matters. In fact he underwent an angiogram yesterday. He had [name] laser atherectomy of the left posterior tibial artery with [name] 3 mm balloon angioplasty, also [name] stent of the last left SFA. 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 This is [name] lot better than the last time I saw it. He has been using Dakin's  wet-to-dry. He has an appointment with infectious disease for [name] bone biopsy which showed E. coli, stenotrophomonas and Enterococcus faecalis. The appointment is for tomorrow 04/14/2022 Mr. Emil Weigold ajuddin is an uncontrolled type 2 diabetic on insulin that presents with [name] left foot ulcer. On 12/18/2021 he required [name] left fifth ray amputation. He had revision of this site on 6/17 by Dr. Posey Pronto, podiatry. He states he has had [name] wound VAC on this area for the past 3 weeks. It is unclear what the prior wound care has been. He has [name] history of osteomyelitis to this area and was treated with IV and oral antibiotics For 6 weeks by Dr. Linus Salmons of infectious disease. At his last follow-up on 02/20/2022 his CRP was trending down and antibiotics were stopped. He also has [name] history of peripheral arterial disease status post stenting to the left SFA and balloon angioplasty to the left posterior tibial artery On 12/2021. He has follow-up with vein and vascular  at the end of the month. He also has [name] wound to the dorsal aspect of the left foot that he has been placing Betadine on. 10/19; patient presents for follow-up. He had [name] bone biopsy of the left foot at last clinic visit showed osteomyelitis. The bone culture showed Enterobacter cloacae, Enterococcus faecalis, Staphylococcus aureus, viridans group strep, actinotigum schaelii. He has been referred to infectious disease And has an appointment on October 25. He has been using Dakin's wet-to-dry dressing to the lateral left foot wound and Hydrofera Blue and Medihoney to the dorsal foot wound. He has no issues or complaints today. He denies systemic signs of infection. 10/26; patient presents for follow-up. He saw Dr. Linus Salmons yesterday with infectious disease and has been prescribed doxycycline and Levaquin for his chronic osteomyelitis. He has been using Dakin's wet-to-dry dressings to the lateral left foot wound and Medihoney and Hydrofera Blue to the dorsal wound. We discussed [name] skin graft for the dorsal foot wound and he would like to proceed with insurance verification. 11/3; patient with [name] wound on the dorsal left foot and [name] substantial postsurgical area on the lateral left foot fifth ray amputation. He is also followed by Dr. Alton Revere of infectious disease. He had [name] bone biopsy from the left lateral foot that showed acute osteomyelitis. Swab culture showed multiple organisms. He has also been revascularized by infectious disease status post left SFA stenting and tibial laser atherectomy and angioplasty. An MRI of the left foot had previously shown osteomyelitis of the fifth met head we have been using Dakin's wet-to-dry to the left lateral foot and Medihoney and Hydrofera Blue to the wound on the dorsal foot. 11/13; HBO treatment was discussed at last clinic visit and patient would like to proceed with this. He has orientation today. He has been approved for Grafix. We have been using Hydrofera Blue and  Medihoney to the left dorsal foot and collagen to the left lateral foot. Patient has home health that comes in for dressing changes. He currently has no issues or complaints today. 12/7; patient missed his last clinic appointment. He was last seen 3 weeks ago. He is currently off of antibiotics per ID. He completed [name] prolonged treatment. He has been using collagen to the lateral foot wound. He has been using Medihoney and Hydrofera Blue to the dorsal foot wound. He has been doing well with hyperbaric oxygen therapy. He has no issues or complaints today. He denies signs of of infection. 12/14; patient presents for follow-up. We have been using  Grafix to the wound beds. He has had trouble equalizing the pressure in his ears during HBO treatments. It was recommended he follow-up with ENT He does not have an appointment yet. He currently denies signs of infection. . 12/21; patient presents for follow-up. He was seen by ENT and had tubes placed. He is continued HBO without issues. We have been using Grafix to the wound beds. There is been improvement in wound healing. 12/21; patient presents for follow-up. He has no issues or complaints today. There is been improvement in wound healing. Electronic Signature(s) Signed: 07/02/2022 11:33:03 AM By: [name] Shan DO Entered By: [name] on 07/02/2022 10:45:48 -------------------------------------------------------------------------------- Physical Exam Details Patient Name: Date of Service: [name], [name]. 07/02/2022 10:30 [name] Medical Record Number: [id number] Patient Account Number: [id number] Date of Birth/Sex: Treating [name]: [birth year] (66 y.o. M) Primary Care Provider: Roselee [name] Other Clinician: Referring Provider: Treating Provider/Extender: [name], [name] in Treatment: 11 Constitutional respirations regular, non-labored and within target range for patient.. Cardiovascular 2+ dorsalis pedis/posterior  tibialis pulses. Psychiatric pleasant and cooperative. Notes Left foot: T the lateral aspect there is an open wound with granulation tissue and nonviable tissue. No probing to bone . T the dorsal aspect there is an open o o wound with granulation tissue and non viable tissue present. No signs of surrounding soft tissue infection. [name], [name] ([id number]) [id number] Page 6 of 14 Electronic Signature(s) Signed: 07/02/2022 11:33:03 AM By: [name] Shan DO Entered By: [name] on 07/02/2022 10:46:32 -------------------------------------------------------------------------------- Physician Orders Details Patient Name: Date of Service: [name], [name]. 07/02/2022 10:30 [name] Medical Record Number: [id number] Patient Account Number: [id number] Date of Birth/Sex: Treating [name]: 1956-05-07 (66 y.o. [name], [name] Primary Care Provider: Roselee [name] Other Clinician: Referring Provider: Treating Provider/Extender: [name] in Treatment: [phone] Verbal / Phone Orders: No Diagnosis Coding Follow-up Appointments ppointment in 1 week. - W/ Dr. Heber Hiltonia Return [name] Anesthetic (In clinic) Topical Lidocaine 5% applied to wound bed Cellular or Tissue Based Products Cellular or Tissue Based Product Type: - Run IVR for Grafix for left dorsal foot- pending Grafix # 1 applied 06/11/22 Grafix # 2 applied 06/18/22 Grafix # 3 applied 06/25/22 GRafix # 4 applied 07/02/22 Edema Control - Lymphedema / SCD / Other Avoid standing for long periods of time. Off-Loading Open toe surgical shoe to: - wear when standing or walking. Home Health New wound care orders this week; continue Home Health for wound care. May utilize formulary equivalent dressing for wound treatment orders unless otherwise specified. - skin sub placed on dorsal foot. do not remove. only change outer dressing. Prisma and ABD pad placed on lateral foot wound just for  this week. May change this daily. Dressing changes to be completed by [facility] on Monday / Wednesday / Friday except when patient has scheduled visit at [facility]. Other Home Health Orders/Instructions: - [place] home [facility]. Hyperbaric Oxygen Therapy Evaluate for HBO Therapy Indication: - Wagner Grade III to left lateral foot 2.5 ATA for 90 Minutes with 2 Five (5) Minute [name] Breaks ir Total Number of Treatments: - 40 One treatments per day (delivered Monday through Friday unless otherwise specified in Special Instructions below): Finger stick Blood Glucose Pre- and Post- HBOT Treatment. Follow Hyperbaric Oxygen [name] Protocol Afrin (Oxymetazoline HCL) 0.05% nasal spray - 1 spray in both nostrils daily as needed prior to HBO treatment for difficulty clearing ears Wound Treatment Wound #4 - Foot Wound  Laterality: Dorsal, Left Cleanser: Wound Cleanser (Home Health) 1 x Per Day/30 Days Discharge Instructions: Cleanse the wound with wound cleanser prior to applying [name] clean dressing using gauze sponges, not tissue or cotton balls. [name]-Wound Care: Skin Prep (Home Health) 1 x Per Day/30 Days Discharge Instructions: Use skin prep as directed Prim Dressing: Grafix 1 x Per Day/30 Days ary Discharge Instructions: Secure with adaptic and steri strips Secondary Dressing: ABD Pad, 8x10 (Home Health) 1 x Per Day/30 Days Discharge Instructions: Apply over primary dressing as directed. Secured With: [employer], 4.5x3.1 (in/yd) (Home Health) 1 x Per Day/30 Days Discharge Instructions: Secure with Kerlix as directed. Secured With: 4M Medipore H Soft Cloth Surgical Tape, 4 x 10 (in/yd) (Home Health) 1 x Per Day/30 Days [name], [name] ([id number]) [id number] Page 7 of 14 Discharge Instructions: Secure with tape as directed. Wound #5 - Foot Wound Laterality: Left, Lateral Cleanser: Wound Cleanser (Home Health) 1 x Per Day/30 Days Discharge Instructions:  Cleanse the wound with wound cleanser prior to applying [name] clean dressing using gauze sponges, not tissue or cotton balls. [name]-Wound Care: Skin Prep (Home Health) 1 x Per Day/30 Days Discharge Instructions: Use skin prep as directed Prim Dressing: Grafix ary 1 x Per Day/30 Days Secondary Dressing: ABD Pad, 8x10 (Home Health) 1 x Per Day/30 Days Discharge Instructions: Apply over primary dressing as directed. Secured With: [employer], 4.5x3.1 (in/yd) (Home Health) 1 x Per Day/30 Days Discharge Instructions: Secure with Kerlix as directed. Secured With: 4M Medipore H Soft Cloth Surgical T ape, 4 x 10 (in/yd) (Home Health) 1 x Per Day/30 Days Discharge Instructions: Secure with tape as directed. [name] [name] INTERVENTIONS ACTION INTERVENTION Obtain [name] capillary blood glucose (ensure 1 physician order is in chart). [name]. Notify HBO physician and await physician orders. 2 If result is 70 mg/dl or below: B. If the result meets the hospital definition of [name] critical result, follow hospital policy. [name]. Give patient an 8 ounce Glucerna Shake, an 8 ounce Ensure, or 8 ounces of [name] Glucerna/Ensure equivalent dietary supplement*. B. Wait 30 minutes. If result is 71 mg/dl to 130 mg/dl: C. Retest patients capillary blood glucose (CBG). D. If result greater than or equal to 110 mg/dl, proceed with HBO. If result less than 110 mg/dl, notify HBO physician and consider holding HBO. If result is 131 mg/dl to 249 mg/dl: [name]. Proceed with HBO. [name]. Notify HBO physician and await physician orders. B. It is recommended to hold HBO and do If result is 250 mg/dl or greater: blood/urine ketone testing. C. If the result meets the hospital definition of [name] critical result, follow hospital policy. POST-HBO [name] INTERVENTIONS ACTION INTERVENTION Obtain post HBO capillary blood glucose (ensure 1 physician order is in chart). [name]. Notify HBO physician and await  physician orders. 2 If result is 70 mg/dl or below: B. If the result meets the hospital definition of [name] critical result, follow hospital policy. [name]. Give patient an 8 ounce Glucerna Shake, an 8 ounce Ensure, or 8 ounces of [name] Glucerna/Ensure equivalent dietary supplement*. B. Wait 15 minutes for symptoms of If result is 71 mg/dl to 100 mg/dl: hypoglycemia (i.e. nervousness, anxiety, sweating, chills, clamminess, irritability, confusion, tachycardia or dizziness). C. If patient asymptomatic, discharge patient. If patient symptomatic, repeat capillary blood glucose (CBG) and notify HBO physician. If result is 101 mg/dl to 249 mg/dl: [name]. Discharge patient. [name]. Notify HBO physician and await physician orders. B. It is recommended to do blood/urine ketone If result  is 250 mg/dl or greater: testing. C. If the result meets the hospital definition of [name] critical result, follow hospital policy. *Juice or candies are NOT equivalent products. If patient refuses the Glucerna or Ensure, please consult the hospital dietitian for an appropriate substitute. Electronic Signature(s) Signed: 07/02/2022 11:33:03 AM By: [name] Shan DO Entered By: [name] on 07/02/2022 10:46:41 [name] ([id number]) [id number] Page 8 of 14 -------------------------------------------------------------------------------- Problem List Details Patient Name: Date of Service: [name], [name]. 07/02/2022 10:30 [name] Medical Record Number: [id number] Patient Account Number: [id number] Date of Birth/Sex: Treating [name]: [birth year] (66 y.o. M) Primary Care Provider: Roselee [name] Other Clinician: Referring Provider: Treating Provider/Extender: [name] in Treatment: 11 Active Problems ICD-10 Encounter Code Description Active Date MDM Diagnosis L97.522 Non-pressure chronic ulcer of other part of left foot with fat layer exposed 04/14/2022 No  Yes M86.172 Other acute osteomyelitis, left ankle and foot 05/08/2022 No Yes L97.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 specified 04/14/2022 No Yes severity E11.621 Type 2 diabetes mellitus with foot ulcer 04/14/2022 No Yes E11.42 Type 2 diabetes mellitus with diabetic polyneuropathy 04/14/2022 No Yes Inactive Problems Resolved Problems Electronic Signature(s) Signed: 07/02/2022 11:33:03 AM By: [name] Shan DO Entered By: [name] on 07/02/2022 10:45:09 -------------------------------------------------------------------------------- Progress Note Details Patient Name: Date of Service: [name], [place] [name]. 07/02/2022 10:30 [name] Medical Record Number: [id number] Patient Account Number: [id number] Date of Birth/Sex: Treating [name]: [birth year] (66 y.o. M) Primary Care Provider: Roselee [name] Other Clinician: Referring Provider: Treating Provider/Extender: [name], [name] in Treatment: 11 Subjective Chief Complaint Information obtained from Patient 10/16/20; patient is here with [name] substantial wound on his right foot in the setting of [name] recent transmetatarsal amputation. 04/14/2022; referral from podiatry for postsurgical wound of nonhealing partial left fifth ray amputation KAYO, ZION [name] ([id number]) [id number] Page 9 of 14 History of Present Illness (HPI) ADMISSION 10/16/20 This is [name] 66 year old man who is [name] type II diabetic. Initially seen by Dr. Unk Lightning of vein and vascular on 09/10/2021 for bilateral lower extremity rest pain right greater than left. His ABIs demonstrated monophasic waveforms at the ankles bilaterally with depressed toe pressures. His ABI in the right was 0.5 and on the left 0.28. There were no obtainable waveforms for TBI's. He underwent angiography on 09/10/2021 and had findings of severe PAD with 95% stenosis of the distal SFA. He also had peroneal and posterior tibial arteries  occluded with significant collateralization in the calf there was reconstitution of the posterior tibial artery in the distal third of the calf. Ostia of the anterior tibial artery was not appreciated. He underwent [name] angioplasty of the superficial femoral artery. He required admission to hospital from 09/12/2021 through 09/22/2021 with right foot cellulitis and sepsis. On 3/13 he underwent [name] right below-knee popliteal to posterior tibial bypass using [name] greater saphenous vein. Ultimately however he required [name] right TMA by podiatry which I believe was done on 3/17 for progressive diabetic foot infection. He was discharged to [facility] skilled facility. He arrives in clinic today with [name] large wound area over the entirety of his amputation site extending proximally. The attempted flap closure of the amputation site and his TMA has failed and the wound extends under the attempt to closure. Still some sutures in place. There is an odor but he is not systemically unwell. He is due for follow-up arterial studies and has an appointment with vascular surgery on 10/28/2021. They  are using wet-to-dry dressings at [facility]. Past medical history includes type 2 diabetes with peripheral neuropathy, hypertension, obstructive sleep apnea 4/20; patient presents for follow-up. He is being discharged from his facility at [facility] today. They have been doing Dakin's wet-to-dry dressings. He states that his fiance can help with dressing changes. He is getting [facility] home [facility] to come out 3 times [name] week once he leaves the facility. He is scheduled to see vein and vascular on 5/12. He has not seen the wound himself. He puts covers over his face for wound exams. He currently denies systemic signs of infection. 4/28; patient admitted to the clinic 2 weeks ago with [name] dehisced right TMA in the setting of type 2 diabetes with significant PAD. He is status post revascularization. He has been discharged from the nursing home he  was at and now is at home using Dakin's wet-to-dry dressings daily he has home health. He denies any systemic signs of infection 5/4; patient presents for follow-up. His fiance and home health have been changing his dressings. He currently denies systemic signs of infection. He continues to put sheet covers over his face during our wound encounters because he does not want [name] look at the wound. 5/25; Patient presents for follow-up. He missed his last clinic appointment. He had [name] bone biopsy and culture done at last clinic visit that showed acute osteomyelitis with growth of E. coli, stenotrophomonas maltophilia and enteroccocus faecalis. [name] referral to infectious disease was made. He has not heard from them yet. He has been using Dakin's wet-to-dry dressings. He is now more comfortable with looking at the wound bed. 6/6; the patient comes into clinic today with 2 wounds on the left foot which apparently have been there for several months. This was not known to assess but was known by Dr. Donzetta Matters. In fact he underwent an angiogram yesterday. He had [name] laser atherectomy of the left posterior tibial artery with [name] 3 mm balloon angioplasty, also [name] stent of the last left SFA. 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 This is [name] lot better than the last time I saw it. He has been using Dakin's wet-to-dry. He has an appointment with infectious disease for [name] bone biopsy which showed E. coli, stenotrophomonas and Enterococcus faecalis. The appointment is for tomorrow 04/14/2022 Mr. Marvens Hollars ajuddin is an uncontrolled type 2 diabetic on insulin that presents with [name] left foot ulcer. On 12/18/2021 he required [name] left fifth ray amputation. He had revision of this site on 6/17 by Dr. Posey Pronto, podiatry. He states he has had [name] wound VAC on this area for the past 3  weeks. It is unclear what the prior wound care has been. He has [name] history of osteomyelitis to this area and was treated with IV and oral antibiotics For 6 weeks by Dr. Linus Salmons of infectious disease. At his last follow-up on 02/20/2022 his CRP was trending down and antibiotics were stopped. He also has [name] history of peripheral arterial disease status post stenting to the left SFA and balloon angioplasty to the left posterior tibial artery On 12/2021. He has follow-up with vein and vascular at the end of the month. He also has [name] wound to the dorsal aspect of the left foot that he has been placing Betadine on. 10/19; patient presents for follow-up.  He had [name] bone biopsy of the left foot at last clinic visit showed osteomyelitis. The bone culture showed Enterobacter cloacae, Enterococcus faecalis, Staphylococcus aureus, viridans group strep, actinotigum schaelii. He has been referred to infectious disease And has an appointment on October 25. He has been using Dakin's wet-to-dry dressing to the lateral left foot wound and Hydrofera Blue and Medihoney to the dorsal foot wound. He has no issues or complaints today. He denies systemic signs of infection. 10/26; patient presents for follow-up. He saw Dr. Linus Salmons yesterday with infectious disease and has been prescribed doxycycline and Levaquin for his chronic osteomyelitis. He has been using Dakin's wet-to-dry dressings to the lateral left foot wound and Medihoney and Hydrofera Blue to the dorsal wound. We discussed [name] skin graft for the dorsal foot wound and he would like to proceed with insurance verification. 11/3; patient with [name] wound on the dorsal left foot and [name] substantial postsurgical area on the lateral left foot fifth ray amputation. He is also followed by Dr. Alton Revere of infectious disease. He had [name] bone biopsy from the left lateral foot that showed acute osteomyelitis. Swab culture showed multiple organisms. He has also been revascularized by infectious  disease status post left SFA stenting and tibial laser atherectomy and angioplasty. An MRI of the left foot had previously shown osteomyelitis of the fifth met head we have been using Dakin's wet-to-dry to the left lateral foot and Medihoney and Hydrofera Blue to the wound on the dorsal foot. 11/13; HBO treatment was discussed at last clinic visit and patient would like to proceed with this. He has orientation today. He has been approved for Grafix. We have been using Hydrofera Blue and Medihoney to the left dorsal foot and collagen to the left lateral foot. Patient has home health that comes in for dressing changes. He currently has no issues or complaints today. 12/7; patient missed his last clinic appointment. He was last seen 3 weeks ago. He is currently off of antibiotics per ID. He completed [name] prolonged treatment. He has been using collagen to the lateral foot wound. He has been using Medihoney and Hydrofera Blue to the dorsal foot wound. He has been doing well with hyperbaric oxygen therapy. He has no issues or complaints today. He denies signs of of infection. 12/14; patient presents for follow-up. We have been using Grafix to the wound beds. He has had trouble equalizing the pressure in his ears during HBO treatments. It was recommended he follow-up with ENT He does not have an appointment yet. He currently denies signs of infection. . 12/21; patient presents for follow-up. He was seen by ENT and had tubes placed. He is continued HBO without issues. We have been using Grafix to the wound beds. There is been improvement in wound healing. 12/21; patient presents for follow-up. He has no issues or complaints today. There is been improvement in wound healing. Patient History Information obtained from Patient, Chart. Family History Diabetes - Mother,Siblings, Heart Disease - Siblings, Hypertension - Siblings, Stroke - Mother, No family history of Cancer, Hereditary Spherocytosis, Kidney  Disease, Lung Disease, Seizures, Thyroid Problems, Tuberculosis. NIRVAN, LABAN [name] ([id number]) [id number] Page 10 of 59 Social History Former smoker, Marital Status - Single, Alcohol Use - Never, Drug Use - No History, Caffeine Use - Daily. Medical History Eyes Denies history of Cataracts, Glaucoma, Optic Neuritis Respiratory Patient has history of Asthma, Sleep Apnea Denies history of Aspiration, Chronic Obstructive Pulmonary Disease (COPD), Pneumothorax, Tuberculosis Cardiovascular Patient has history of Hypertension, Peripheral Venous  Disease Denies history of Angina, Arrhythmia, Congestive Heart Failure, Coronary Artery Disease, Deep Vein Thrombosis, Hypotension, Myocardial Infarction, Peripheral Arterial Disease, Phlebitis, Vasculitis Endocrine Patient has history of Type II Diabetes Denies history of Type I Diabetes Integumentary (Skin) Denies history of History of Burn Musculoskeletal Patient has history of Osteomyelitis Denies history of Gout, Rheumatoid Arthritis, Osteoarthritis Neurologic Patient has history of Neuropathy Denies history of Dementia, Quadriplegia, Paraplegia, Seizure Disorder Hospitalization/Surgery History - 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 Posey Pronto. - aortogram 12/08/2021 Dr. Virl Cagey [facility]. Medical [name] Surgical History Notes nd Psychiatric PTSD Objective Constitutional respirations regular, non-labored and within target range for patient.. Vitals Time Taken: 9:50 AM, Height: 72 in, Weight: 220 lbs, BMI: 29.8, Temperature: 97.7 F, Pulse: 85 bpm, Respiratory Rate: 18 breaths/min, Blood Pressure: 154/84 mmHg, Capillary Blood Glucose: 190 mg/dl. Cardiovascular 2+ dorsalis pedis/posterior tibialis pulses. Psychiatric pleasant and cooperative. General Notes: Left foot: T the lateral aspect there is an open wound with granulation tissue and nonviable tissue. No probing to bone . T the dorsal aspect o  o there is an open wound with granulation tissue and non viable tissue present. No signs of surrounding soft tissue infection. Integumentary (Hair, Skin) Wound #4 status is Open. Original cause of wound was Gradually Appeared. The date acquired was: 09/03/2021. The wound has been in treatment 11 weeks. The wound is located on the Left,Dorsal Foot. The wound measures 0.5cm length x 0.2cm width x 0.1cm depth; 0.079cm^2 area and 0.008cm^3 volume. There is Fat Layer (Subcutaneous Tissue) exposed. There is no tunneling or undermining noted. There is [name] medium amount of serosanguineous drainage noted. The wound margin is distinct with the outline attached to the wound base. There is large (67-100%) pink, pale granulation within the wound bed. There is no necrotic tissue within the wound bed. The periwound skin appearance had no abnormalities noted for moisture. The periwound skin appearance had no abnormalities noted for color. The periwound skin appearance did not exhibit: Callus, Crepitus, Excoriation, Induration, Rash, Scarring. Periwound temperature was noted as No Abnormality. Wound #5 status is Open. Original cause of wound was Surgical Injury. The date acquired was: 12/20/2021. The wound has been in treatment 11 weeks. The wound is located on the Left,Lateral Foot. The wound measures 5.5cm length x 0.5cm width x 0.4cm depth; 2.16cm^2 area and 0.864cm^3 volume. There is bone and Fat Layer (Subcutaneous Tissue) exposed. There is no tunneling or undermining noted. There is [name] medium amount of serosanguineous drainage noted. Foul odor after cleansing was noted. The wound margin is epibole. There is large (67-100%) red, pink granulation within the wound bed. There is [name] small (1-33%) amount of necrotic tissue within the wound bed including Adherent Slough. The periwound skin appearance had no abnormalities noted for color. The periwound skin appearance exhibited: Callus. The periwound skin appearance did not  exhibit: Crepitus, Excoriation, Induration, Rash, Scarring, Dry/Scaly, Maceration. Periwound temperature was noted as No Abnormality. Assessment Active Problems ICD-10 Non-pressure chronic ulcer of other part of left foot with fat layer exposed Other acute osteomyelitis, left ankle and foot Non-pressure chronic ulcer of other part of left foot with other specified severity Type 2 diabetes mellitus with foot ulcer Type 2 diabetes mellitus with diabetic polyneuropathy BRECK, MARYLAND [name] ([id number]) [id number] Page 11 of 14 Patient's wounds have improved in size appearance since last clinic visit. I debrided nonviable tissue. Grafix was placed in standard fashion. Continue aggressive offloading. Continue HBO therapy. Procedures Wound #4 Pre-procedure diagnosis of Wound #4 is [name] Diabetic Wound/Ulcer  of the Lower Extremity located on the Left,Dorsal Foot .Severity of Tissue Pre Debridement is: Fat layer exposed. There was [name] Excisional Skin/Subcutaneous Tissue Debridement with [name] total area of 0.1 sq cm performed by [name] Shan, DO. With the following instrument(s): Curette to remove Viable and Non-Viable tissue/material. Material removed includes Subcutaneous Tissue and Slough and after achieving pain control using Lidocaine. No specimens were taken. [name] time out was conducted at 10:34, prior to the start of the procedure. [name] Minimum amount of bleeding was controlled with Pressure. The procedure was tolerated well with [name] pain level of 0 throughout and [name] pain level of 0 following the procedure. Post Debridement Measurements: 0.5cm length x 0.2cm width x 0.1cm depth; 0.008cm^3 volume. Character of Wound/Ulcer Post Debridement is improved. Severity of Tissue Post Debridement is: Fat layer exposed. Post procedure Diagnosis Wound #4: Same as Pre-Procedure Pre-procedure diagnosis of Wound #4 is [name] Diabetic Wound/Ulcer of the Lower Extremity located on the Left,Dorsal Foot. [name]  skin graft procedure using [name] bioengineered skin substitute/cellular or tissue based product was performed by [name] Shan, DO with the following instrument(s): Forceps and Scissors. Grafix prime was applied and secured with Steri-Strips. 1 sq cm of product was utilized and 5 sq cm was wasted due to wound size; applied to other wound. Post Application, adaptic was applied. [name] Time Out was conducted at 10:40, prior to the start of the procedure. The procedure was tolerated well with [name] pain level of 0 throughout and [name] pain level of 0 following the procedure. Post procedure Diagnosis Wound #4: Same as Pre-Procedure . Wound #5 Pre-procedure diagnosis of Wound #5 is [name] Diabetic Wound/Ulcer of the Lower Extremity located on the Left,Lateral Foot .Severity of Tissue Pre Debridement is: Fat layer exposed. There was [name] Excisional Skin/Subcutaneous Tissue Debridement with [name] total area of 2.75 sq cm performed by [name] Shan, DO. With the following instrument(s): Curette to remove Viable and Non-Viable tissue/material. Material removed includes Subcutaneous Tissue and Slough and after achieving pain control using Lidocaine. No specimens were taken. [name] time out was conducted at 10:34, prior to the start of the procedure. [name] Minimum amount of bleeding was controlled with Pressure. The procedure was tolerated well with [name] pain level of 0 throughout and [name] pain level of 0 following the procedure. Post Debridement Measurements: 5.5cm length x 0.5cm width x 0.4cm depth; 0.864cm^3 volume. Character of Wound/Ulcer Post Debridement is improved. Severity of Tissue Post Debridement is: Fat layer exposed. Post procedure Diagnosis Wound #5: Same as Pre-Procedure Pre-procedure diagnosis of Wound #5 is [name] Diabetic Wound/Ulcer of the Lower Extremity located on the Left,Lateral Foot. [name] skin graft procedure using [name] bioengineered skin substitute/cellular or tissue based product was performed by [name] Shan, DO with the  following instrument(s): Forceps and Scissors. Grafix prime was applied and secured with Steri-Strips. 5 sq cm of product was utilized and 0 sq cm was wasted. Post Application, adaptic was applied. [name] Time Out was conducted at 10:40, prior to the start of the procedure. The procedure was tolerated well with [name] pain level of 0 throughout and [name] pain level of 0 following the procedure. Post procedure Diagnosis Wound #5: Same as Pre-Procedure . Plan Follow-up Appointments: Return Appointment in 1 week. - W/ Dr. Heber Topanga Anesthetic: (In clinic) Topical Lidocaine 5% applied to wound bed Cellular or Tissue Based Products: Cellular or Tissue Based Product Type: - Run IVR for Grafix for left dorsal foot- pending Grafix # 1 applied 06/11/22 Grafix # 2 applied 06/18/22 Grafix #  3 applied 06/25/22 GRafix # 4 applied 07/02/22 Edema Control - Lymphedema / SCD / Other: Avoid standing for long periods of time. Off-Loading: Open toe surgical shoe to: - wear when standing or walking. Home Health: New wound care orders this week; continue Home Health for wound care. May utilize formulary equivalent dressing for wound treatment orders unless otherwise specified. - skin sub placed on dorsal foot. do not remove. only change outer dressing. Prisma and ABD pad placed on lateral foot wound just for this week. May change this daily. Dressing changes to be completed by [place] on Monday / Wednesday / Friday except when patient has scheduled visit at [facility]. Other Home Health Orders/Instructions: - [place] home health. Hyperbaric Oxygen Therapy: Evaluate for HBO Therapy Indication: - Wagner Grade III to left lateral foot 2.5 ATA for 90 Minutes with 2 Five (5) Minute Air Breaks T Number of Treatments: - 40 otal One treatments per day (delivered Monday through Friday unless otherwise specified in Special Instructions below): Finger stick Blood Glucose Pre- and Post- HBOT Treatment. Follow Hyperbaric  Oxygen [name] Protocol Afrin (Oxymetazoline HCL) 0.05% nasal spray - 1 spray in both nostrils daily as needed prior to HBO treatment for difficulty clearing ears WOUND #4: - Foot Wound Laterality: Dorsal, Left Cleanser: Wound Cleanser (Home Health) 1 x Per Day/30 Days Discharge Instructions: Cleanse the wound with wound cleanser prior to applying [name] clean dressing using gauze sponges, not tissue or cotton balls. [name]-Wound Care: Skin Prep (Home Health) 1 x Per Day/30 Days Discharge Instructions: Use skin prep as directed Prim Dressing: Grafix 1 x Per Day/30 Days ary Discharge Instructions: Secure with adaptic and steri strips Secondary Dressing: ABD Pad, 8x10 (Home Health) 1 x Per Day/30 Days Discharge Instructions: Apply over primary dressing as directed. CORGAN, MORMILE [name] ([id number]) [id number] Page 12 of 14 Secured With: [employer], 4.5x3.1 (in/yd) (Home Health) 1 x Per Day/30 Days Discharge Instructions: Secure with Kerlix as directed. Secured With: 39M Medipore H Soft Cloth Surgical T ape, 4 x 10 (in/yd) (Home Health) 1 x Per Day/30 Days Discharge Instructions: Secure with tape as directed. WOUND #5: - Foot Wound Laterality: Left, Lateral Cleanser: Wound Cleanser (Home Health) 1 x Per Day/30 Days Discharge Instructions: Cleanse the wound with wound cleanser prior to applying [name] clean dressing using gauze sponges, not tissue or cotton balls. [name]-Wound Care: Skin Prep (Home Health) 1 x Per Day/30 Days Discharge Instructions: Use skin prep as directed Prim Dressing: Grafix 1 x Per Day/30 Days ary Secondary Dressing: ABD Pad, 8x10 (Home Health) 1 x Per Day/30 Days Discharge Instructions: Apply over primary dressing as directed. Secured With: [employer], 4.5x3.1 (in/yd) (Home Health) 1 x Per Day/30 Days Discharge Instructions: Secure with Kerlix as directed. Secured With: 39M Medipore H Soft Cloth Surgical T ape, 4 x 10 (in/yd) (Home Health)  1 x Per Day/30 Days Discharge Instructions: Secure with tape as directed. 1. Grafix placed in standard fashion 2. In office sharp debridement 3. Surgical shoeooaggressive offloading 4. Follow-up in 1 week Electronic Signature(s) Signed: 07/02/2022 11:33:03 AM By: [name] Shan DO Entered By: [name] on 07/02/2022 10:48:11 -------------------------------------------------------------------------------- HxROS Details Patient Name: Date of Service: [name], [name]. 07/02/2022 10:30 [name] Medical Record Number: [id number] Patient Account Number: [id number] Date of Birth/Sex: Treating [name]: [birth year] (66 y.o. M) Primary Care Provider: Roselee [name] Other Clinician: Referring Provider: Treating Provider/Extender: [name] in Treatment: 11 Information Obtained From Patient Chart Eyes Medical  History: Negative for: Cataracts; Glaucoma; Optic Neuritis Respiratory Medical History: Positive for: Asthma; Sleep Apnea Negative for: Aspiration; Chronic Obstructive Pulmonary Disease (COPD); Pneumothorax; Tuberculosis Cardiovascular Medical History: Positive for: Hypertension; Peripheral Venous Disease Negative for: Angina; Arrhythmia; Congestive Heart Failure; Coronary Artery Disease; Deep Vein Thrombosis; Hypotension; Myocardial Infarction; Peripheral Arterial Disease; Phlebitis; Vasculitis Endocrine Medical History: Positive for: Type II Diabetes Negative for: Type I Diabetes Time with diabetes: 20 years Treated with: Insulin, Oral agents Blood sugar tested every day: Yes Tested : 4x day Integumentary (Skin) Medical History: Negative for: History of Burn BEHR, CISLO [name] ([id number]) [id number] Page 13 of 14 Musculoskeletal Medical History: Positive for: Osteomyelitis Negative for: Gout; Rheumatoid Arthritis; Osteoarthritis Neurologic Medical History: Positive for: Neuropathy Negative for: Dementia;  Quadriplegia; Paraplegia; Seizure Disorder Psychiatric Medical History: Past Medical History Notes: PTSD Immunizations Pneumococcal Vaccine: Received Pneumococcal Vaccination: No Implantable Devices None Hospitalization / Surgery History Type of Hospitalization/Surgery 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 Posey Pronto aortogram 12/08/2021 Dr. Virl Cagey [facility] Family and Social History Cancer: No; Diabetes: Yes - Mother,Siblings; Heart Disease: Yes - Siblings; Hereditary Spherocytosis: No; Hypertension: Yes - Siblings; Kidney Disease: No; Lung Disease: No; Seizures: No; Stroke: Yes - Mother; Thyroid Problems: No; Tuberculosis: No; Former smoker; Marital Status - Single; Alcohol Use: Never; Drug Use: No History; Caffeine Use: Daily; Financial Concerns: No; Food, Clothing or Shelter Needs: No; Support System Lacking: No; Transportation Concerns: No Electronic Signature(s) Signed: 07/02/2022 11:33:03 AM By: [name] Shan DO Entered By: [name] on 07/02/2022 10:45:57 -------------------------------------------------------------------------------- SuperBill Details Patient Name: Date of Service: [name], [name]. 07/02/2022 Medical Record Number: [id number] Patient Account Number: [id number] Date of Birth/Sex: Treating [name]: 11/13/1955 (66 y.o. [name], [name] Primary Care Provider: Roselee [name] Other Clinician: Referring Provider: Treating Provider/Extender: [name] in Treatment: 11 Diagnosis Coding ICD-10 Codes Code Description [phone] Non-pressure chronic ulcer of other part of left foot with fat layer exposed M86.172 Other acute osteomyelitis, left ankle and foot L97.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 specified severity E11.621 Type 2 diabetes mellitus with foot ulcer E11.42 Type 2 diabetes mellitus with diabetic polyneuropathy Facility Procedures : ZACHAREY, JENSEN Code: [id number] Divante  [name] ([id number] ICD L9 Description: W1027- Grafix PL 2X3 sq cm (6 units) 0) [id number] - SKIN SUB GRAFT FACE/NK/HF/G -10 Diagnosis Description 7.522 Non-pressure chronic ulcer of other part of left foot with fat layer exposed Modifier: [id number]_51227 1 Quantity: 6 .pdf Page 14 of 14 : CPT4 Code: [id number] ICD L9 Description: 73 - DEB SUBQ TISSUE 20 SQ CM/< -10 Diagnosis Description 7.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 specified severity Modifier: 1 Quantity: Physician Procedures : CPT4 Code Description Modifier [id number] 60630 - WC PHYS SKIN SUB GRAFT FACE/NK/HF/G ICD-10 Diagnosis Description L97.522 Non-pressure chronic ulcer of other part of left foot with fat layer exposed Quantity: 1 : [id number] 23557 - WC PHYS SUBQ TISS 20 SQ CM ICD-10 Diagnosis Description L97.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 specified severity Quantity: 1 Electronic Signature(s) Signed: 07/02/2022 11:33:03 AM By: [name] Shan DO Entered By: [name] on 07/02/2022 10:48:17

## 2022-07-03 ENCOUNTER — Encounter (HOSPITAL_BASED_OUTPATIENT_CLINIC_OR_DEPARTMENT_OTHER): Payer: Medicare HMO | Admitting: Internal Medicine

## 2022-07-03 DIAGNOSIS — L97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 specified severity: Secondary | ICD-10-CM | POA: Diagnosis not present

## 2022-07-03 DIAGNOSIS — L97522 Non-pressure chronic ulcer of other part of left foot with fat layer exposed: Secondary | ICD-10-CM

## 2022-07-03 DIAGNOSIS — E11621 Type 2 diabetes mellitus with foot ulcer: Secondary | ICD-10-CM | POA: Diagnosis not present

## 2022-07-03 DIAGNOSIS — M86172 Other acute osteomyelitis, left ankle and foot: Secondary | ICD-10-CM

## 2022-07-03 LAB — GLUCOSE, CAPILLARY
Glucose-Capillary: 234 mg/dL — ABNORMAL HIGH (ref 70–99)
Glucose-Capillary: 95 mg/dL (ref 70–99)

## 2022-07-03 NOTE — Progress Notes (Addendum)
AUTHER, LYERLY [name] ([id number]) [id number] Page 1 of 2 Visit Report for 07/03/2022 HBO Details Patient Name: Date of Service: [name], [name]. 07/03/2022 9:00 [name] Medical Record Number: [id number] Patient Account Number: [id number] Date of Birth/Sex: Treating RN: 11-20-1955 (66 y.o. [name], [name] Primary Care [name]: [name] Other Clinician: [name] Referring [name]: Treating [name]: [name] in Treatment: 11 HBO Treatment Course Details Treatment Course Number: 1 Ordering [name]: [name] T Treatments Ordered: otal 40 HBO Treatment Start Date: 06/01/2022 HBO Indication: Diabetic Ulcer(s) of the Lower Extremity Standard/Conservative Wound Care tried and failed greater than or equal to 30 days Wound #4 Left, Dorsal Foot , Wound #5 Left, Lateral Foot HBO Treatment Details Treatment Number: 19 Patient Type: Outpatient Chamber Type: Monoplace Chamber Serial #: [id number] Treatment Protocol: 2.5 ATA with 90 minutes oxygen, with two 5 minute air breaks Treatment Details Compression Rate Down: 1.5 psi / minute De-Compression Rate Up: 2.0 psi / minute [name] breaks and breathing ir Compress Tx Pressure periods Decompress Decompress Begins Reached (leave unused spaces Begins Ends blank) Chamber Pressure (ATA 1 2.5 2.5 2.5 2.5 2.5 - - 2.5 1 ) Clock Time (24 hr) 09:18 09:34 10:04 10:09 10:39 10:44 - - 11:14 11:25 Treatment Length: 127 (minutes) Treatment Segments: 4 Vital Signs Capillary Blood Glucose Reference Range: 80 - 120 mg / dl HBO Diabetic Blood Glucose Intervention Range: <131 mg/dl or >249 mg/dl Type: Time Vitals Blood Pulse: Respiratory Temperature: Capillary Blood Glucose Pulse Action Taken: Pressure: Rate: Glucose (mg/dl): Meter #: Oximetry (%) Taken: Pre 08:12 131/83 87 18 97.6 234 1 none per protocol Post 11:34 138/84 67 18 98 95 Patient given 8 oz Glucerna Shake, per  protocol. Treatment Response Treatment Toleration: Well Treatment Completion Status: Treatment Completed without Adverse Event Treatment Notes Patient arrived, glucose level measured at 0812, resulting in 234 mg/dL. Patient stated that he ate Chicken Egg and Cheese biscuit with orange juice. Patient prepared for treatment and underwent safety check. Chamber compressed at 1 psi/min until approximately 5 psig at which time the patient confirmed no issues with ear equalization and rate set increased to 2 psi/min. Patient tolerated treatment and decompression of chamber at 2 psi/min. Post treatment glucose level was 95 mg/dL. Patient given 8 oz Glucerna (1135) and waited 15 minutes. Patient asymptomatic for hypoglycemia. Patient stable upon discharge. Additional Procedure Documentation Tissue Sevierity: Necrosis of bone Physician HBO Attestation: I certify that I supervised this HBO treatment in accordance with Medicare guidelines. [name] trained emergency response team is readily available per Yes hospital policies and procedures. Continue HBOT as ordered. Yes Electronic Signature(s) Signed: 07/03/2022 2:09:11 PM By: [name] Shan DO Previous Signature: 07/03/2022 12:23:53 PM Version By: [name] EMT BS , , [name] [name] ([id number]) [id number] Page 2 of 2 Previous Signature: 07/03/2022 12:23:53 PM Version By: [name] EMT BS , , Previous Signature: 07/03/2022 12:02:20 PM Version By: [name] EMT BS , , Previous Signature: 07/03/2022 10:32:14 AM Version By: [name] EMT BS , , Previous Signature: 07/03/2022 10:31:31 AM Version By: [name] EMT BS , , Entered By: [name] on 07/03/2022 14:08:34 -------------------------------------------------------------------------------- HBO Safety Checklist Details Patient Name: Date of Service: [name], [name] 07/03/2022 9:00 [name] Medical Record  Number: [id number] Patient Account Number: [id number] Date of Birth/Sex: Treating RN: February 12, 1956 (66 y.o. [name] Primary Care [name]: [name] Other Clinician: [name] Referring [name]: Treating [name]: [name]  [name], [name] in Treatment: 11 HBO Safety Checklist Items Safety Checklist Consent Form Signed Patient voided / foley secured and emptied When did you last eato 0730 Last dose of injectable or oral agent 0615 Ostomy pouch emptied and vented if applicable NA All implantable devices assessed, documented and approved NA Intravenous access site secured and place NA Valuables secured Linens and cotton and cotton/polyester blend (less than 51% polyester) Personal oil-based products / skin lotions / body lotions removed Wigs or hairpieces removed NA Smoking or tobacco materials removed NA Books / newspapers / magazines / loose paper removed Cologne, aftershave, perfume and deodorant removed Jewelry removed (may wrap wedding band) Make-up removed NA Hair care products removed Battery operated devices (external) removed Heating patches and chemical warmers removed Titanium eyewear removed Nail polish cured greater than 10 hours NA Casting material cured greater than 10 hours NA Hearing aids removed NA Loose dentures or partials removed dentures removed Prosthetics have been removed NA Patient demonstrates correct use of air break device (if applicable) Patient concerns have been addressed Patient grounding bracelet on and cord attached to chamber Specifics for Inpatients (complete in addition to above) Medication sheet sent with patient NA Intravenous medications needed or due during therapy sent with patient NA Drainage tubes (e.g. nasogastric tube or chest tube secured and vented) NA Endotracheal or Tracheotomy tube secured NA Cuff deflated of air and inflated with saline NA Airway suctioned NA Notes Paper  version used prior to treatment. Electronic Signature(s) Signed: 07/03/2022 10:30:28 AM By: [name] EMT BS , , Entered By: [name] on 07/03/2022 10:30:28

## 2022-07-03 NOTE — Progress Notes (Signed)
MAK, BONNY A ([id number]) [id number] Page 1 of 1 Visit Report for 07/03/2022 Arrival Information Details Patient Name: Date of Service: [name], [name] 07/03/2022 9:00 A M Medical Record Number: [id number] Patient Account Number: [id number] Date of Birth/Sex: Treating RN: 06-15-1956 (66 y.o. [name], [name] Primary Care Rosanna Bickle: [name] Other Clinician: [name] Referring Glenora Morocho: Treating Deaaron Fulghum/Extender: [name] in Treatment: 11 Visit Information History Since Last Visit All ordered tests and consults were completed: Yes Patient Arrived: Wheel Chair Added or deleted any medications: No Arrival Time: 08:09 Any new allergies or adverse reactions: No Accompanied By: self Had a fall or experienced change in No Transfer Assistance: None activities of daily living that may affect Patient Identification Verified: Yes risk of falls: Secondary Verification Process Completed: Yes Signs or symptoms of abuse/neglect since last visito No Patient Requires Transmission-Based Precautions: No Hospitalized since last visit: No Patient Has Alerts: Yes Implantable device outside of the clinic excluding No Patient Alerts: ESBL;MRSA cellular tissue based products placed in the center since last visit: Pain Present Now: No Electronic Signature(s) Signed: 07/03/2022 10:28:31 AM By: [name] CHT EMT BS , , Entered By: [name] on 07/03/2022 10:28:31 -------------------------------------------------------------------------------- Vitals Details Patient Name: Date of Service: [name], [name] 07/03/2022 9:00 A M Medical Record Number: [id number] Patient Account Number: [id number] Date of Birth/Sex: Treating RN: 1955-08-21 (66 y.o. [name], [name] Primary Care [name]: [name] Other Clinician: Valeria Batman Referring Rosina Cressler: Treating Egan Berkheimer/Extender: [name] in Treatment: 11 Vital Signs Time Taken: 08:12 Temperature (F): 97.6 Height (in): 72 Pulse (bpm): 87 Weight (lbs): 220 Respiratory Rate (breaths/min): 18 Body Mass Index (BMI): 29.8 Blood Pressure (mmHg): 131/83 Capillary Blood Glucose (mg/dl): 234 Reference Range: 80 - 120 mg / dl Electronic Signature(s) Signed: 07/03/2022 10:29:28 AM By: [name] CHT EMT BS , , Entered By: [name] on 07/03/2022 23:55:73

## 2022-07-03 NOTE — Progress Notes (Signed)
SEIYA, SILSBY A ([id number]) [id number] Page 1 of 1 Visit Report for 07/03/2022 SuperBill Details Patient Name: Date of Service: [name], [name] 07/03/2022 Medical Record Number: [id number] Patient Account Number: [id number] Date of Birth/Sex: Treating RN: Apr 19, 1956 (66 y.o. [name], [name] Primary Care Provider: [name] Other Clinician: [name] Referring Provider: Treating Provider/Extender: [name] in Treatment: 11 Diagnosis Coding ICD-10 Codes Code Description [phone] Non-pressure chronic ulcer of other part of left foot with fat layer exposed M86.172 Other acute osteomyelitis, left ankle and foot L97.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 specified severity E11.621 Type 2 diabetes mellitus with foot ulcer E11.42 Type 2 diabetes mellitus with diabetic polyneuropathy Facility Procedures CPT4 Code Description Modifier Quantity [phone] G0277-(Facility Use Only) HBOT full body chamber, 24mn , 4 ICD-10 Diagnosis Description E11.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 M86.172 Other acute osteomyelitis, left ankle and foot Physician Procedures Quantity CPT4 Code Description Modifier [phone] - WC PHYS HYPERBARIC OXYGEN THERAPY 1 ICD-10 Diagnosis Description E11.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 M86.172 Other acute osteomyelitis, left ankle and foot Electronic Signature(s) Signed: 07/03/2022 12:02:55 PM By: SDonavan BurnetCHT EMT BS , , Signed: 07/03/2022 2:09:11 PM By: HKalman Shan DO Entered By: SDonavan Burneton 07/03/2022 12:02:55

## 2022-07-07 ENCOUNTER — Encounter (HOSPITAL_BASED_OUTPATIENT_CLINIC_OR_DEPARTMENT_OTHER): Payer: Medicare HMO | Attending: Internal Medicine | Admitting: Internal Medicine

## 2022-07-07 DIAGNOSIS — M86172 Other acute osteomyelitis, left ankle and foot: Secondary | ICD-10-CM | POA: Diagnosis not present

## 2022-07-07 DIAGNOSIS — L97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 specified severity: Secondary | ICD-10-CM

## 2022-07-07 DIAGNOSIS — E11621 Type 2 diabetes mellitus with foot ulcer: Secondary | ICD-10-CM | POA: Diagnosis present

## 2022-07-07 DIAGNOSIS — L97522 Non-pressure chronic ulcer of other part of left foot with fat layer exposed: Secondary | ICD-10-CM

## 2022-07-07 DIAGNOSIS — E1142 Type 2 diabetes mellitus with diabetic polyneuropathy: Secondary | ICD-10-CM | POA: Diagnosis not present

## 2022-07-07 LAB — GLUCOSE, CAPILLARY
Glucose-Capillary: 230 mg/dL — ABNORMAL HIGH (ref 70–99)
Glucose-Capillary: 308 mg/dL — ABNORMAL HIGH (ref 70–99)

## 2022-07-07 NOTE — Progress Notes (Addendum)
SHANT, HENCE [name] ([id number]) [id number] Page 1 of 2 Visit Report for 07/07/2022 Arrival Information Details Patient Name: Date of Service: [name], [name]. 07/07/2022 8:00 [name] Medical Record Number: [id number] Patient Account Number: [id number] Date of Birth/Sex: Treating RN: [birth year] [name] (67 y.o. [name], [name] Primary Care Reg Bircher: [name] Other Clinician: [name] Referring [name]: Treating [name]/Extender: [name] in Treatment: 12 Visit Information History Since Last Visit All ordered tests and consults were completed: Yes Patient Arrived: Wheel Chair Added or deleted any medications: No Arrival Time: 07:43 Any new allergies or adverse reactions: No Accompanied By: spouse Had [name] fall or experienced change in No Transfer Assistance: None activities of daily living that may affect Patient Identification Verified: Yes risk of falls: Secondary Verification Process Completed: Yes Signs or symptoms of abuse/neglect since last visito No Patient Requires Transmission-Based Precautions: No Hospitalized since last visit: No Patient Has Alerts: Yes Implantable device outside of the clinic excluding No Patient Alerts: ESBL;MRSA cellular tissue based products placed in the center since last visit: Pain Present Now: No Electronic Signature(s) Signed: 07/07/2022 11:31:51 AM By: [name] CHT EMT BS , , Previous Signature: 07/07/2022 9:06:54 AM Version By: [name] CHT EMT BS , , Entered By: [name] on 07/07/2022 11:31:51 -------------------------------------------------------------------------------- Encounter Discharge Information Details Patient Name: Date of Service: [name], [name] 07/07/2022 8:00 [name] Medical Record Number: [id number] Patient Account Number: [id number] Date of Birth/Sex: Treating RN: [name] (67 y.o. [name] Primary Care [name]: [name] Other Clinician: [name] Referring [name]: Treating [name]/Extender: [name] in Treatment: 12 Encounter Discharge Information Items Discharge Condition: Stable Ambulatory Status: Wheelchair Discharge Destination: Home Transportation: Private Auto Accompanied By: spouse Schedule Follow-up Appointment: No Clinical Summary of Care: Electronic Signature(s) Signed: 07/07/2022 11:26:51 AM By: [name] CHT EMT BS , , Entered By: [name] on 07/07/2022 11:26:51 [name] ([id number]) [id number] Page 2 of 2 -------------------------------------------------------------------------------- Vitals Details Patient Name: Date of Service: [name], [name]. 07/07/2022 8:00 [name] Medical Record Number: [id number] Patient Account Number: [id number] Date of Birth/Sex: Treating RN: [birth year] (67 y.o. [name], [name] Primary Care Taleigha Pinson: [name] Other Clinician: [name] Referring Lenise Jr: Treating Marka Treloar/Extender: [name], [name] in Treatment: 12 Vital Signs Time Taken: 07:53 Temperature (F): 98.2 Height (in): 72 Pulse (bpm): 98 Weight (lbs): 220 Respiratory Rate (breaths/min): 16 Body Mass Index (BMI): 29.8 Blood Pressure (mmHg): 145/79 Capillary Blood Glucose (mg/dl): 230 Reference Range: 80 - 120 mg / dl Electronic Signature(s) Signed: 07/07/2022 9:07:31 AM By: [name] CHT EMT BS , , Entered By: [name] on 07/07/2022 09:07:30

## 2022-07-07 NOTE — Progress Notes (Signed)
AURELIANO, OSHIELDS A ([id number]) [id number] Page 1 of 1 Visit Report for 07/07/2022 SuperBill Details Patient Name: Date of Service: [name], [name] 07/07/2022 Medical Record Number: [id number] Patient Account Number: [id number] Date of Birth/Sex: Treating RN: 06/04/1956 (67 y.o. [name], [name] Primary Care Provider: [name] Other Clinician: [name] Referring Provider: Treating Provider/Extender: [name] in Treatment: 12 Diagnosis Coding ICD-10 Codes Code Description [phone] Non-pressure chronic ulcer of other part of left foot with fat layer exposed M86.172 Other acute osteomyelitis, left ankle and foot L97.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 specified severity E11.621 Type 2 diabetes mellitus with foot ulcer E11.42 Type 2 diabetes mellitus with diabetic polyneuropathy Facility Procedures CPT4 Code Description Modifier Quantity [id number] G0277-(Facility Use Only) HBOT full body chamber, 74mn , 4 ICD-10 Diagnosis Description E11.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 M86.172 Other acute osteomyelitis, left ankle and foot Physician Procedures Quantity CPT4 Code Description Modifier [id number] - WC PHYS HYPERBARIC OXYGEN THERAPY 1 ICD-10 Diagnosis Description E11.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 M86.172 Other acute osteomyelitis, left ankle and foot Electronic Signature(s) Signed: 07/07/2022 11:26:20 AM By: SDonavan BurnetCHT EMT BS , , Signed: 07/07/2022 12:20:08 PM By: HKalman ShanDO Entered  By: SDonavan Burneton 07/07/2022 11:26:19

## 2022-07-07 NOTE — Progress Notes (Addendum)
GANESH, [name] ([id number]) [id number] Page 1 of 2 Visit Report for 07/07/2022 HBO Details Patient Name: Date of Service: [name], [name] 07/07/2022 8:00 A M Medical Record Number: [id number] Patient Account Number: [id number] Date of Birth/Sex: Treating RN: Nov 29, 1955 (67 y.o. [name], [name] Primary Care Harel Repetto: [name] Other Clinician: [name] Referring Aurorah Schlachter: Treating Shahab Polhamus/Extender: [name] in Treatment: 12 HBO Treatment Course Details Treatment Course Number: 1 Ordering Kayona Foor: [name] T Treatments Ordered: otal 40 HBO Treatment Start Date: 06/01/2022 HBO Indication: Diabetic Ulcer(s) of the Lower Extremity Standard/Conservative Wound Care tried and failed greater than or equal to 30 days Wound #4 Left, Dorsal Foot , Wound #5 Left, Lateral Foot HBO Treatment Details Treatment Number: 20 Patient Type: Outpatient Chamber Type: Monoplace Chamber Serial #: [id number] Treatment Protocol: 2.5 ATA with 90 minutes oxygen, with two 5 minute air breaks Treatment Details Compression Rate Down: 2.0 psi / minute De-Compression Rate Up: 2.0 psi / minute A breaks and breathing ir Compress Tx Pressure periods Decompress Decompress Begins Reached (leave unused spaces Begins Ends blank) Chamber Pressure (ATA 1 2.5 2.5 2.5 2.5 2.5 - - 2.5 1 ) Clock Time (24 hr) 08:13 08:25 08:55 09:00 09:30 09:35 - - 10:05 10:16 Treatment Length: 123 (minutes) Treatment Segments: 4 Vital Signs Capillary Blood Glucose Reference Range: 80 - 120 mg / dl HBO Diabetic Blood Glucose Intervention Range: <131 mg/dl or >249 mg/dl Type: Time Vitals Blood Respiratory Capillary Blood Glucose Pulse Action Pulse: Temperature: Taken: Pressure: Rate: Glucose (mg/dl): Meter #: Oximetry (%) Taken: Pre 07:53 145/79 98 16 98.2 230 1 none per protocol Post 10:27 143/82 89 18 97.7 308 1 Physician informed Treatment  Response Treatment Toleration: Well Treatment Completion Status: Treatment Completed without Adverse Event Treatment Notes Patient arrived, blood glucose level checked resulting in 230 mg/dL. Patient stated that he ate steak, egg, and cheese buiscuit for breakfast and took 80 units of 70/30 Insulin at 0700. Afterward Mr. Picotte prepared for treatment. After performing safety check, he was safely placed in hyperbaric chamber and compression was at rate set of 1 psi/min until reaching 3 psig at which time patient denied any symptoms related to barotrauma and rate set was increased to 2 psi/min for the remainder of the travel phase. Patient tolerated treatment, and decompression of chamber at rate of 2 psi/min. Patient's post-treatment glucose was 308 mg/dL. Reino Kent informed Dr. Heber Whitehall. Patient was stable upon discharge. Additional Procedure Documentation Tissue Sevierity: Necrosis of bone Physician HBO Attestation: I certify that I supervised this HBO treatment in accordance with Medicare guidelines. A trained emergency response team is readily available per Yes hospital policies and procedures. Continue HBOT as ordered. Yes Electronic Signature(s) Signed: 07/07/2022 12:20:08 PM By: Elenor Legato [id number] ByKalman Shan DO [id number] Page 2 of 2 Signed: 07/07/2022 12:20:08 Previous Signature: 07/07/2022 11:34:35 AM Version By: [name] CHT EMT BS , , Previous Signature: 07/07/2022 11:31:30 AM Version By: [name] CHT EMT BS , , Previous Signature: 07/07/2022 11:07:54 AM Version By: [name] CHT EMT BS , , Entered By: [name] on 07/07/2022 12:00:32 -------------------------------------------------------------------------------- HBO Safety Checklist Details Patient Name: Date of Service: [name], [name] 07/07/2022 8:00 A M Medical Record Number: [id number] Patient Account Number: [id number] Date  of Birth/Sex: Treating RN: 06-28-1956 (67 y.o. [name] Primary Care Mackenzey Crownover: [name] Other Clinician: [name] Referring Jordynne Mccown: Treating Haly Feher/Extender: [name], [name] in Treatment: [phone]  HBO Safety Checklist Items Safety Checklist Consent Form Signed Patient voided / foley secured and emptied When did you last eato 0730 Last dose of injectable or oral agent 0700 Ostomy pouch emptied and vented if applicable NA All implantable devices assessed, documented and approved NA Intravenous access site secured and place NA Valuables secured Linens and cotton and cotton/polyester blend (less than 51% polyester) Personal oil-based products / skin lotions / body lotions removed Wigs or hairpieces removed NA Smoking or tobacco materials removed NA Books / newspapers / magazines / loose paper removed Cologne, aftershave, perfume and deodorant removed Jewelry removed (may wrap wedding band) Make-up removed NA Hair care products removed Battery operated devices (external) removed Heating patches and chemical warmers removed Titanium eyewear removed Nail polish cured greater than 10 hours NA Casting material cured greater than 10 hours NA Hearing aids removed NA Loose dentures or partials removed dentures removed Prosthetics have been removed NA Patient demonstrates correct use of air break device (if applicable) Patient concerns have been addressed Patient grounding bracelet on and cord attached to chamber Specifics for Inpatients (complete in addition to above) Medication sheet sent with patient NA Intravenous medications needed or due during therapy sent with patient NA Drainage tubes (e.g. nasogastric tube or chest tube secured and vented) NA Endotracheal or Tracheotomy tube secured NA Cuff deflated of air and inflated with saline NA Airway suctioned NA Notes Paper version used prior to treatment. Electronic  Signature(s) Signed: 07/07/2022 9:08:41 AM By: [name] CHT EMT BS , , Entered By: [name] on 07/07/2022 09:08:41

## 2022-07-08 ENCOUNTER — Encounter (HOSPITAL_BASED_OUTPATIENT_CLINIC_OR_DEPARTMENT_OTHER): Payer: Medicare HMO | Attending: Physician Assistant | Admitting: Physician Assistant

## 2022-07-08 DIAGNOSIS — M86172 Other acute osteomyelitis, left ankle and foot: Secondary | ICD-10-CM | POA: Diagnosis not present

## 2022-07-08 DIAGNOSIS — Y835 Amputation of limb(s) as the cause of abnormal reaction of the patient, or of later complication, without mention of misadventure at the time of the procedure: Secondary | ICD-10-CM | POA: Diagnosis not present

## 2022-07-08 DIAGNOSIS — L97522 Non-pressure chronic ulcer of other part of left foot with fat layer exposed: Secondary | ICD-10-CM | POA: Insufficient documentation

## 2022-07-08 DIAGNOSIS — L97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 severity: Secondary | ICD-10-CM | POA: Diagnosis not present

## 2022-07-08 DIAGNOSIS — E1169 Type 2 diabetes mellitus with other specified complication: Secondary | ICD-10-CM | POA: Diagnosis not present

## 2022-07-08 DIAGNOSIS — E11621 Type 2 diabetes mellitus with foot ulcer: Secondary | ICD-10-CM | POA: Insufficient documentation

## 2022-07-08 DIAGNOSIS — T8781 Dehiscence of amputation stump: Secondary | ICD-10-CM | POA: Insufficient documentation

## 2022-07-08 DIAGNOSIS — E1142 Type 2 diabetes mellitus with diabetic polyneuropathy: Secondary | ICD-10-CM | POA: Diagnosis not present

## 2022-07-08 LAB — GLUCOSE, CAPILLARY
Glucose-Capillary: 159 mg/dL — ABNORMAL HIGH (ref 70–99)
Glucose-Capillary: 149 mg/dL — ABNORMAL HIGH (ref 70–99)
Glucose-Capillary: 112 mg/dL — ABNORMAL HIGH (ref 70–99)

## 2022-07-08 NOTE — Progress Notes (Signed)
DIMITRIUS, STEEDMAN A ([id number]) [id number] Page 1 of 1 Visit Report for 07/08/2022 SuperBill Details Patient Name: Date of Service: [name], [name] 07/08/2022 Medical Record Number: [id number] Patient Account Number: [id number] Date of Birth/Sex: Treating RN: [birth year] (67 y.o. 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 Facility Procedures CPT4 Code Description Modifier Quantity [id number]-(Facility Use Only) HBOT full body chamber, 14mn , 4 ICD-10 Diagnosis Description E11.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 M86.172 Other acute osteomyelitis, left ankle and foot Physician Procedures Quantity CPT4 Code Description Modifier [phone] - WC PHYS HYPERBARIC OXYGEN THERAPY 1 ICD-10 Diagnosis Description E11.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

## 2022-07-08 NOTE — Progress Notes (Addendum)
[name], [name] A ([id number]) [id number] Page 1 of 2 Visit Report for 07/08/2022 Arrival Information Details Patient Name: Date of Service: [name], [name] 07/08/2022 8:00 A M Medical Record Number: [id number] Patient Account Number: [id number] Date of Birth/Sex: Treating RN: 1955-08-23 (67 y.o. [name] Primary Care Georgia Baria: [name] Other Clinician: [name] Referring Aeneas Longsworth: Treating Katherina Wimer/Extender: [name], [name] in Treatment: 12 Visit Information History Since Last Visit All ordered tests and consults were completed: Yes Patient Arrived: Wheel Chair Added or deleted any medications: No Arrival Time: 08:10 Any new allergies or adverse reactions: No Accompanied By: self Had a fall or experienced change in No Transfer Assistance: None activities of daily living that may affect Patient Identification Verified: Yes risk of falls: Secondary Verification Process Completed: Yes Signs or symptoms of abuse/neglect since last visito No Patient Requires Transmission-Based Precautions: No Hospitalized since last visit: No Patient Has Alerts: Yes Implantable device outside of the clinic excluding No Patient Alerts: ESBL;MRSA cellular tissue based products placed in the center since last visit: Pain Present Now: No Electronic Signature(s) Signed: 07/08/2022 9:02:54 AM By: [name] CHT EMT BS , , Entered By: [name] on 07/08/2022 09:02:54 -------------------------------------------------------------------------------- Encounter Discharge Information Details Patient Name: Date of Service: [name], [name] 07/08/2022 8:00 A M Medical Record Number: [id number] Patient Account Number: [id number] Date of Birth/Sex: Treating RN: 03-Nov-1955 (67 y.o. [name] Primary Care Cobe Viney: [name] Other Clinician: [name] Referring Chaz Mcglasson: Treating Abie Cheek/Extender:  [name], [name] in Treatment: 12 Encounter Discharge Information Items Discharge Condition: Stable Ambulatory Status: Wheelchair Discharge Destination: Home Transportation: Private Auto Accompanied By: self Schedule Follow-up Appointment: No Clinical Summary of Care: Electronic Signature(s) Signed: 07/08/2022 1:45:34 PM By: [name] CHT EMT BS , , Entered By: [name] on 07/08/2022 13:45:33 [name] ([id number]) [id number] Page 2 of 2 -------------------------------------------------------------------------------- Vitals Details Patient Name: Date of Service: [name], [name] 07/08/2022 8:00 A M Medical Record Number: [id number] Patient Account Number: [id number] Date of Birth/Sex: Treating RN: 1955/07/31 (67 y.o. [name] Primary Care Sheala Dosh: [name] Other Clinician: [name] Referring Siyon Linck: Treating Nikia Levels/Extender: [name], [name] in Treatment: 12 Vital Signs Time Taken: 08:10 Temperature (F): 98.0 Height (in): 72 Pulse (bpm): 86 Weight (lbs): 220 Respiratory Rate (breaths/min): 18 Body Mass Index (BMI): 29.8 Blood Pressure (mmHg): 128/73 Capillary Blood Glucose (mg/dl): 159 Reference Range: 80 - 120 mg / dl Electronic Signature(s) Signed: 07/08/2022 9:13:18 AM By: [name] CHT EMT BS , , Entered By: [name] on 07/08/2022 09:13:18

## 2022-07-08 NOTE — Progress Notes (Addendum)
SHAH, INSLEY A ([id number]) [id number] Page 1 of 2 Visit Report for 07/08/2022 HBO Details Patient Name: Date of Service: [name], [name] 07/08/2022 8:00 A M Medical Record Number: [id number] Patient Account Number: [id number] Date of Birth/Sex: Treating RN: May 14, 1956 (67 y.o. [name] Primary Care [name]: [name] Other Clinician: [name] Referring [name]: Treating [name]/Extender: [name], [name] in Treatment: 12 HBO Treatment Course Details Treatment Course Number: 1 Ordering [name]: [name] T Treatments Ordered: otal 40 HBO Treatment Start Date: 06/01/2022 HBO Indication: Diabetic Ulcer(s) of the Lower Extremity Standard/Conservative Wound Care tried and failed greater than or equal to 30 days Wound #4 Left, Dorsal Foot , Wound #5 Left, Lateral Foot HBO Treatment Details Treatment Number: 21 Patient Type: Outpatient Chamber Type: Monoplace Chamber Serial #: [id number] Treatment Protocol: 2.5 ATA with 90 minutes oxygen, with two 5 minute air breaks Treatment Details Compression Rate Down: 1.5 psi / minute De-Compression Rate Up: 2.0 psi / minute A breaks and breathing ir Compress Tx Pressure periods Decompress Decompress Begins Reached (leave unused spaces Begins Ends blank) Chamber Pressure (ATA 1 2.5 2.5 2.5 2.5 2.5 - - 2.5 1 ) Clock Time (24 hr) 08:43 08:57 09:33 09:38 10:02 10:07 - - 10:37 10:42 Treatment Length: 119 (minutes) Treatment Segments: 4 Vital Signs Capillary Blood Glucose Reference Range: 80 - 120 mg / dl HBO Diabetic Blood Glucose Intervention Range: <131 mg/dl or >249 mg/dl Type: Time Vitals Blood Pulse: Respiratory Temperature: Capillary Blood Glucose Pulse Action Taken: Pressure: Rate: Glucose (mg/dl): Meter #: Oximetry (%) Taken: Pre 08:14 128/73 86 18 98 159 1 Glucose @ 0814, Other vitals 0833 Post 10:55 109/74 72 18 97.7 112 Pre 08:33 149 1 Given 8 oz  Glucerna for safety Treatment Response Treatment Toleration: Well Treatment Completion Status: Treatment Completed without Adverse Event Treatment Notes Patient arrived, blood glucose level checked at 0814, resulting in 159 mg/dL. Mr. Boyte stated that he ate sausage egg, that his measured blood glucose at home was 299, and that he took 80 units 60/20 Novolog. Patient prepared for treatment. Other vital signs taken at 0831. Secondary blood glucose level taken as a precaution (149 mg/dL). Patient was given an 8 oz Glucerna for safety. Patient travelled at 1 psi/min until reaching 3 psig at which point patient verified that ear equalization today was normal for him. Rate set was increased to 1.5 psi/min until reaching 10 psig and then the rate set was increased to 2 psi/min until reaching treatment depth of 2.5 ATA. Patient tolerated treatment well and decompression of chamber at a rate of 2 psi/min as well. Post treatment blood glucose level was 112 mg/dL. Patient changed clothes for discharge and was stable upon discharge. Additional Procedure Documentation Tissue Sevierity: Necrosis of bone Electronic Signature(s) Signed: 07/08/2022 2:02:06 PM By: [name] CHT EMT BS , , Signed: 07/08/2022 4:16:28 PM By: Worthy Keeler PA-C Previous Signature: 07/08/2022 1:44:34 PM Version By: [name] CHT EMT BS , , Entered By: [name] on 07/08/2022 14:02:06 [name] ([id number]) [id number] Page 2 of 2 -------------------------------------------------------------------------------- HBO Safety Checklist Details Patient Name: Date of Service: [name], [name] 07/08/2022 8:00 A M Medical Record Number: [id number] Patient Account Number: [id number] Date of Birth/Sex: Treating RN: 1955/10/21 (67 y.o. [name] Primary Care Skylee Baird: [name] Other Clinician: [name] Referring Rabecka Brendel: Treating Dylana Shaw/Extender: [name], [name] in Treatment: 12 HBO Safety Checklist Items Safety Checklist Consent Form Signed  Patient voided / foley secured and emptied When did you last eato 0700 Last dose of injectable or oral agent 0630 Ostomy pouch emptied and vented if applicable NA All implantable devices assessed, documented and approved NA Intravenous access site secured and place NA Valuables secured Linens and cotton and cotton/polyester blend (less than 51% polyester) Personal oil-based products / skin lotions / body lotions removed Wigs or hairpieces removed NA Smoking or tobacco materials removed NA Books / newspapers / magazines / loose paper removed Cologne, aftershave, perfume and deodorant removed Jewelry removed (may wrap wedding band) Make-up removed NA Hair care products removed Battery operated devices (external) removed Heating patches and chemical warmers removed Titanium eyewear removed Nail polish cured greater than 10 hours NA Casting material cured greater than 10 hours NA Hearing aids removed NA Loose dentures or partials removed dentures removed Prosthetics have been removed NA Patient demonstrates correct use of air break device (if applicable) Patient concerns have been addressed Patient grounding bracelet on and cord attached to chamber Specifics for Inpatients (complete in addition to above) Medication sheet sent with patient NA Intravenous medications needed or due during therapy sent with patient NA Drainage tubes (e.g. nasogastric tube or chest tube secured and vented) NA Endotracheal or Tracheotomy tube secured NA Cuff deflated of air and inflated with saline NA Airway suctioned NA Notes Paper version used prior to treatment. Electronic Signature(s) Signed: 07/08/2022 9:14:36 AM By: [name] CHT EMT BS , , Entered By: [name] on 07/08/2022 09:14:36

## 2022-07-09 ENCOUNTER — Encounter (HOSPITAL_BASED_OUTPATIENT_CLINIC_OR_DEPARTMENT_OTHER): Payer: Medicare HMO | Admitting: Internal Medicine

## 2022-07-09 DIAGNOSIS — L97522 Non-pressure chronic ulcer of other part of left foot with fat layer exposed: Secondary | ICD-10-CM | POA: Diagnosis not present

## 2022-07-09 DIAGNOSIS — L97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 specified severity: Secondary | ICD-10-CM | POA: Diagnosis not present

## 2022-07-09 DIAGNOSIS — E11621 Type 2 diabetes mellitus with foot ulcer: Secondary | ICD-10-CM | POA: Diagnosis not present

## 2022-07-09 DIAGNOSIS — M86172 Other acute osteomyelitis, left ankle and foot: Secondary | ICD-10-CM

## 2022-07-09 LAB — GLUCOSE, CAPILLARY
Glucose-Capillary: 219 mg/dL — ABNORMAL HIGH (ref 70–99)
Glucose-Capillary: 165 mg/dL — ABNORMAL HIGH (ref 70–99)

## 2022-07-09 NOTE — Progress Notes (Addendum)
LORANCE, PICKERAL A ([id number]) [id number] Page 1 of 2 Visit Report for 07/09/2022 HBO Details Patient Name: Date of Service: [name], [name] 07/09/2022 10:00 A M Medical Record Number: [id number] Patient Account Number: [id number] Date of Birth/Sex: Treating RN: 1956/05/13 (67 y.o. [name] Primary Care Christophere Hillhouse: [name] Other Clinician: Valeria Batman Referring Hetal Proano: Treating Lexa Coronado/Extender: [name] in Treatment: 12 HBO Treatment Course Details Treatment Course Number: 1 Ordering Maykayla Highley: [name] T Treatments Ordered: otal 40 HBO Treatment Start Date: 06/01/2022 HBO Indication: Diabetic Ulcer(s) of the Lower Extremity Standard/Conservative Wound Care tried and failed greater than or equal to 30 days Wound #4 Left, Dorsal Foot , Wound #5 Left, Lateral Foot HBO Treatment Details Treatment Number: 22 Patient Type: Outpatient Chamber Type: Monoplace Chamber Serial #: [id number] Treatment Protocol: 2.5 ATA with 90 minutes oxygen, with two 5 minute air breaks Treatment Details Compression Rate Down: 2.0 psi / minute De-Compression Rate Up: 2.0 psi / minute A breaks and breathing ir Compress Tx Pressure periods Decompress Decompress Begins Reached (leave unused spaces Begins Ends blank) Chamber Pressure (ATA 1 2.5 2.5 2.5 2.5 2.5 - - 2.5 1 ) Clock Time (24 hr) 08:49 09:02 09:32 09:37 10:07 10:12 - - 10:42 10:54 Treatment Length: 125 (minutes) Treatment Segments: 4 Vital Signs Capillary Blood Glucose Reference Range: 80 - 120 mg / dl HBO Diabetic Blood Glucose Intervention Range: <131 mg/dl or >249 mg/dl Time Vitals Blood Respiratory Capillary Blood Glucose Pulse Action Type: Pulse: Temperature: Taken: Pressure: Rate: Glucose (mg/dl): Meter #: Oximetry (%) Taken: Pre 08:23 219 Post 11:05 141/75 78 18 98.4 165 Pre 08:42 129/74 99 16 98.4 Treatment Response Treatment Toleration:  Well Treatment Completion Status: Treatment Completed without Adverse Event Additional Procedure Documentation Tissue Sevierity: Necrosis of bone Physician HBO Attestation: I certify that I supervised this HBO treatment in accordance with Medicare guidelines. A trained emergency response team is readily available per Yes hospital policies and procedures. Continue HBOT as ordered. Yes Electronic Signature(s) Signed: 07/09/2022 3:33:48 PM By: [name] DO Previous Signature: 07/09/2022 12:15:09 PM Version By: Valeria Batman EMT Entered By: [name] on 07/09/2022 14:55:25 [name] ([id number]) [id number] Page 2 of 2 -------------------------------------------------------------------------------- HBO Safety Checklist Details Patient Name: Date of Service: [name], [name] 07/09/2022 10:00 A M Medical Record Number: [id number] Patient Account Number: [id number] Date of Birth/Sex: Treating RN: 1955/08/09 (67 y.o. [name] Primary Care Keishawn Rajewski: [name] Other Clinician: Valeria Batman Referring Lil Lepage: Treating Sianne Tejada/Extender: [name] in Treatment: 12 HBO Safety Checklist Items Safety Checklist Consent Form Signed Patient voided / foley secured and emptied When did you last eato 0715 Last dose of injectable or oral agent 0630 Ostomy pouch emptied and vented if applicable NA All implantable devices assessed, documented and approved Tympanostomy tubes Intravenous access site secured and place NA Valuables secured Linens and cotton and cotton/polyester blend (less than 51% polyester) Personal oil-based products / skin lotions / body lotions removed Wigs or hairpieces removed NA Smoking or tobacco materials removed Books / newspapers / magazines / loose paper removed Cologne, aftershave, perfume and deodorant removed Jewelry removed (may wrap wedding band) Make-up removed NA Hair  care products removed Battery operated devices (external) removed Heating patches and chemical warmers removed Titanium eyewear removed NA Nail polish cured greater than 10 hours NA Casting material cured greater than 10 hours NA Hearing aids removed NA Loose dentures or partials removed NA Did not have them with  him today Prosthetics have been removed NA Patient demonstrates correct use of air break device (if applicable) Patient concerns have been addressed Patient grounding bracelet on and cord attached to chamber Specifics for Inpatients (complete in addition to above) Medication sheet sent with patient NA Intravenous medications needed or due during therapy sent with patient NA Drainage tubes (e.g. nasogastric tube or chest tube secured and vented) NA Endotracheal or Tracheotomy tube secured NA Cuff deflated of air and inflated with saline NA Airway suctioned NA Notes The safety checklist was done before the treatment was started. The patient stated that he had [place] biscuit, Coffee and orange juice before coming in for treatment today. The had 1 spray on each side of Afrin before treatment. Electronic Signature(s) Signed: 07/09/2022 12:11:06 PM By: Valeria Batman EMT Entered By: Valeria Batman on 07/09/2022 12:11:06

## 2022-07-09 NOTE — Progress Notes (Signed)
OZAN, MACLAY A ([id number]) [id number] Page 1 of 13 Visit Report for 06/25/2022 Chief Complaint Document Details Patient Name: Date of Service: [name], [name] 06/25/2022 10:45 A M Medical Record Number: [id number] Patient Account Number: [id number] Date of Birth/Sex: Treating RN: [birth year] (67 y.o. M) Primary Care Provider: [name] Other Clinician: Referring Provider: Treating Provider/Extender: [name], [name] in Treatment: 10 Information Obtained from: Patient Chief Complaint 10/16/20; patient is here with a substantial wound on his right foot in the setting of a recent transmetatarsal amputation. 04/14/2022; referral from podiatry for postsurgical wound of nonhealing partial left fifth ray amputation Electronic Signature(s) Signed: 06/25/2022 2:00:17 PM By: [name] DO Entered By: [name] on 06/25/2022 12:25:56 -------------------------------------------------------------------------------- Cellular or Tissue Based Product Details Patient Name: Date of Service: [name], [name] 06/25/2022 10:45 A M Medical Record Number: [id number] Patient Account Number: [id number] Date of Birth/Sex: Treating RN: 06-Oct-1955 (67 y.o. [name], [name] Primary Care Provider: [name] Other Clinician: Referring Provider: Treating Provider/Extender: [name] in Treatment: 10 Cellular or Tissue Based Product Type Wound #4 Left,Dorsal Foot Applied to: Performed By: Physician [name], DO Cellular or Tissue Based Product Type: Grafix prime Level of Consciousness (Pre-procedure): Awake and Alert Pre-procedure Verification/Time Out Yes - 12:14 Taken: Location: genitalia / hands / feet / multiple digits Wound Size (sq cm): 0.36 Product Size (sq cm): 2 Waste Size (sq cm): 0 Amount of Product Applied (sq cm): 2 Instrument Used: Forceps, Scissors Lot #:  [id number] Expiration Date: 09/16/2023 Fenestrated: No Reconstituted: Yes Solution Type: NS Solution Amount: 10 mL Lot #: [id number] Solution Expiration Date: 12/03/2024 Secured: Yes Secured With: Steri-Strips Dressing Applied: Yes Primary Dressing: adaptic Procedural Pain: 0 Post Procedural Pain: 0 Response to Treatment: Procedure was tolerated well Level of Consciousness (Post- Awake and Alert ALESSIO, BOGAN A ([id number]) [id number] Page 2 of 13 procedure): Post Procedure Diagnosis Same as Pre-procedure Electronic Signature(s) Signed: 06/25/2022 2:00:17 PM By: [name] DO Signed: 07/08/2022 5:35:40 PM By: Rhae Hammock RN Entered By: Rhae Hammock on 06/25/2022 12:16:22 -------------------------------------------------------------------------------- Debridement Details Patient Name: Date of Service: [name], [name] 06/25/2022 10:45 A M Medical Record Number: [id number] Patient Account Number: [id number] Date of Birth/Sex: Treating RN: 18-Nov-1955 (67 y.o. [name], [name] Primary Care Provider: [name] Other Clinician: Referring Provider: Treating Provider/Extender: [name], [name] in Treatment: 10 Debridement Performed for Assessment: Wound #4 Left,Dorsal Foot Performed By: Physician [name], DO Debridement Type: Debridement Severity of Tissue Pre Debridement: Fat layer exposed Level of Consciousness (Pre-procedure): Awake and Alert Pre-procedure Verification/Time Out Yes - 11:53 Taken: Start Time: 11:53 Pain Control: Lidocaine T Area Debrided (L x W): otal 0.9 (cm) x 0.4 (cm) = 0.36 (cm) Tissue and other material debrided: Viable, Non-Viable, Slough, Subcutaneous, Slough Level: Skin/Subcutaneous Tissue Debridement Description: Excisional Instrument: Curette Bleeding: Minimum Hemostasis Achieved: Pressure End Time: 11:53 Procedural Pain: 0 Post Procedural Pain: 0 Response to  Treatment: Procedure was tolerated well Level of Consciousness (Post- Awake and Alert procedure): Post Debridement Measurements of Total Wound Length: (cm) 0.9 Width: (cm) 0.4 Depth: (cm) 0.1 Volume: (cm) 0.028 Character of Wound/Ulcer Post Debridement: Improved Severity of Tissue Post Debridement: Fat layer exposed Post Procedure Diagnosis Same as Pre-procedure Electronic Signature(s) Signed: 06/25/2022 2:00:17 PM By: [name] DO Signed: 07/08/2022 5:35:40 PM By: Rhae Hammock RN Entered By: Rhae Hammock on 06/25/2022 11:53:53 [name] ([id number]) [id number] Page 3 of 13 -------------------------------------------------------------------------------- Debridement Details Patient Name:  Date of Service: [name], [name] 06/25/2022 10:45 A M Medical Record Number: [id number] Patient Account Number: [id number] Date of Birth/Sex: Treating RN: [birth year] (67 y.o. [name], [name] Primary Care Provider: [name] Other Clinician: Referring Provider: Treating Provider/Extender: [name], [name] in Treatment: 10 Debridement Performed for Assessment: Wound #5 Left,Lateral Foot Performed By: Physician [name], DO Debridement Type: Debridement Severity of Tissue Pre Debridement: Fat layer exposed Level of Consciousness (Pre-procedure): Awake and Alert Pre-procedure Verification/Time Out Yes - 11:53 Taken: Start Time: 11:53 Pain Control: Lidocaine T Area Debrided (L x W): otal 5.8 (cm) x 1 (cm) = 5.8 (cm) Tissue and other material debrided: Viable, Non-Viable, Slough, Subcutaneous, Slough Level: Skin/Subcutaneous Tissue Debridement Description: Excisional Instrument: Curette Bleeding: Minimum Hemostasis Achieved: Pressure End Time: 11:53 Procedural Pain: 0 Post Procedural Pain: 0 Response to Treatment: Procedure was tolerated well Level of Consciousness (Post- Awake and  Alert procedure): Post Debridement Measurements of Total Wound Length: (cm) 5.8 Width: (cm) 1 Depth: (cm) 0.4 Volume: (cm) 1.822 Character of Wound/Ulcer Post Debridement: Improved Severity of Tissue Post Debridement: Fat layer exposed Post Procedure Diagnosis Same as Pre-procedure Electronic Signature(s) Signed: 06/25/2022 2:00:17 PM By: [name] DO Signed: 07/08/2022 5:35:40 PM By: Rhae Hammock RN Entered By: Rhae Hammock on 06/25/2022 11:56:24 -------------------------------------------------------------------------------- HPI Details Patient Name: Date of Service: [name], [name] 06/25/2022 10:45 A M Medical Record Number: [id number] Patient Account Number: [id number] Date of Birth/Sex: Treating RN: March 15, 1956 (67 y.o. M) Primary Care Provider: [name] Other Clinician: Referring Provider: Treating Provider/Extender: [name], [name] in Treatment: 10 History of Present Illness HPI Description: ADMISSION 10/16/20 This is a 67 year old man who is a type II diabetic. Initially seen by Dr. Unk Lightning of vein and vascular on 09/10/2021 for bilateral lower extremity rest pain right greater than left. His ABIs demonstrated monophasic waveforms at the ankles bilaterally with depressed toe pressures. His ABI in the right was 0.5 and on the left 0.28. There were no obtainable waveforms for TBI's. He underwent angiography on 09/10/2021 and had findings of severe PAD with 95% stenosis of the distal SFA. He also had peroneal and posterior tibial arteries occluded with significant collateralization in the calf there was reconstitution of the posterior tibial artery in the distal third of the calf. [place] of the anterior tibial artery was not appreciated. He underwent a angioplasty of the superficial femoral artery. He required admission to hospital from 09/12/2021 through 09/22/2021 with right foot cellulitis and sepsis. On 3/13 he underwent a right  below-knee popliteal to JANES, COLEGROVE A ([id number]) [phone] Page 4 of 13 posterior tibial bypass using a greater saphenous vein. 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 He was discharged to [facility] skilled facility. He arrives in clinic today with a large wound area over the entirety of his amputation site extending proximally. The attempted flap closure of the amputation site and his TMA has failed and the wound extends under the attempt to closure. Still some sutures in place. There is an odor but he is not systemically unwell. He is due for follow-up arterial studies and has an appointment with vascular surgery on 10/28/2021. They are using wet-to-dry dressings at [facility]. Past medical history includes type 2 diabetes with peripheral neuropathy, hypertension, obstructive sleep apnea 4/20; patient presents for follow-up. He is being discharged from his facility at [facility] today. They have been doing Dakin's wet-to-dry dressings.  He states that his fiance can help with dressing changes. He is getting [facility] home [facility] to come out 3 times a week once he leaves the facility. He is scheduled to see vein and vascular on 5/12. He has not seen the wound himself. He puts covers over his face for wound exams. He currently denies systemic signs of infection. 4/28; patient admitted to the clinic 2 weeks ago with a dehisced right TMA in the setting of type 2 diabetes with significant PAD. He is status post revascularization. He has been discharged from the nursing home he was at and now is at home using Dakin's wet-to-dry dressings daily he has home health. He denies any systemic signs of infection 5/4; patient presents for follow-up. His fiance and home health have been changing his dressings. He currently denies systemic signs of infection. He continues to put sheet covers over  his face during our wound encounters because he does not want a look at the wound. 5/25; Patient presents for follow-up. He missed his last clinic appointment. He had a bone biopsy and culture done at last clinic visit that showed acute osteomyelitis with growth of E. coli, stenotrophomonas maltophilia and enteroccocus faecalis. A referral to infectious disease was made. He has not heard from them yet. He has been using Dakin's wet-to-dry dressings. He is now more comfortable with looking at the wound bed. 6/6; the patient comes into clinic today with 2 wounds on the left foot which apparently have been there for several months. This was not known to assess but was known by Dr. Donzetta Matters. In fact he underwent an angiogram yesterday. He had a laser atherectomy of the left posterior tibial artery with a 3 mm balloon angioplasty, also a stent of the last left SFA. 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 This is a lot better than the last time I saw it. He has been using Dakin's wet-to-dry. He has an appointment with infectious disease for a bone biopsy which showed E. coli, stenotrophomonas and Enterococcus faecalis. The appointment is for tomorrow 04/14/2022 Mr. Norah Fick ajuddin is an uncontrolled type 2 diabetic on insulin that presents with a left foot ulcer. On 12/18/2021 he required a left fifth ray amputation. He had revision of this site on 6/17 by Dr. Posey Pronto, podiatry. He states he has had a wound VAC on this area for the past 3 weeks. It is unclear what the prior wound care has been. He has a history of osteomyelitis to this area and was treated with IV and oral antibiotics For 6 weeks by Dr. Linus Salmons of infectious disease. At his last follow-up on 02/20/2022 his CRP was trending down and antibiotics were stopped. He also has a history of  peripheral arterial disease status post stenting to the left SFA and balloon angioplasty to the left posterior tibial artery On 12/2021. He has follow-up with vein and vascular at the end of the month. He also has a wound to the dorsal aspect of the left foot that he has been placing Betadine on. 10/19; patient presents for follow-up. He had a bone biopsy of the left foot at last clinic visit showed osteomyelitis. The bone culture showed Enterobacter cloacae, Enterococcus faecalis, Staphylococcus aureus, viridans group strep, actinotigum schaelii. He has been referred to infectious disease And has an appointment on  October 25. He has been using Dakin's wet-to-dry dressing to the lateral left foot wound and Hydrofera Blue and Medihoney to the dorsal foot wound. He has no issues or complaints today. He denies systemic signs of infection. 10/26; patient presents for follow-up. He saw Dr. Linus Salmons yesterday with infectious disease and has been prescribed doxycycline and Levaquin for his chronic osteomyelitis. He has been using Dakin's wet-to-dry dressings to the lateral left foot wound and Medihoney and Hydrofera Blue to the dorsal wound. We discussed a skin graft for the dorsal foot wound and he would like to proceed with insurance verification. 11/3; patient with a wound on the dorsal left foot and a substantial postsurgical area on the lateral left foot fifth ray amputation. He is also followed by Dr. Alton Revere of infectious disease. He had a bone biopsy from the left lateral foot that showed acute osteomyelitis. Swab culture showed multiple organisms. He has also been revascularized by infectious disease status post left SFA stenting and tibial laser atherectomy and angioplasty. An MRI of the left foot had previously shown osteomyelitis of the fifth met head we have been using Dakin's wet-to-dry to the left lateral foot and Medihoney and Hydrofera Blue to the wound on the dorsal foot. 11/13; HBO treatment was  discussed at last clinic visit and patient would like to proceed with this. He has orientation today. He has been approved for Grafix. We have been using Hydrofera Blue and Medihoney to the left dorsal foot and collagen to the left lateral foot. Patient has home health that comes in for dressing changes. He currently has no issues or complaints today. 12/7; patient missed his last clinic appointment. He was last seen 3 weeks ago. He is currently off of antibiotics per ID. He completed a prolonged treatment. He has been using collagen to the lateral foot wound. He has been using Medihoney and Hydrofera Blue to the dorsal foot wound. He has been doing well with hyperbaric oxygen therapy. He has no issues or complaints today. He denies signs of of infection. 12/14; patient presents for follow-up. We have been using Grafix to the wound beds. He has had trouble equalizing the pressure in his ears during HBO treatments. It was recommended he follow-up with ENT He does not have an appointment yet. He currently denies signs of infection. . 12/21; patient presents for follow-up. He was seen by ENT and had tubes placed. He is continued HBO without issues. We have been using Grafix to the wound beds. There is been improvement in wound healing. Electronic Signature(s) Signed: 06/25/2022 2:00:17 PM By: [name] DO Entered By: [name] on 06/25/2022 12:33:13 -------------------------------------------------------------------------------- Physical Exam Details Patient Name: Date of Service: [name], [name] 06/25/2022 10:45 A M Medical Record Number: [id number] Patient Account Number: [id number] Date of Birth/Sex: Treating RN: Oct 28, 1955 (67 y.o. Adelina Mings ([id number]) [id number] Page 5 of 13 Primary Care Provider: [name] Other Clinician: Referring Provider: Treating Provider/Extender: [name], [name] in Treatment:  10 Constitutional respirations regular, non-labored and within target range for patient.. Cardiovascular 2+ dorsalis pedis/posterior tibialis pulses. Psychiatric pleasant and cooperative. Notes Left foot: T the lateral aspect there is an open wound with granulation tissue and nonviable tissue. No probing to bone . T the dorsal aspect there is an open o o wound with granulation tissue and non viable tissue present. No signs of surrounding soft tissue infection. Electronic Signature(s) Signed: 06/25/2022 2:00:17 PM By: [name] DO Entered By: [name] on  06/25/2022 12:34:13 -------------------------------------------------------------------------------- Physician Orders Details Patient Name: Date of Service: [name], [name] 06/25/2022 10:45 A M Medical Record Number: [id number] Patient Account Number: [id number] Date of Birth/Sex: Treating RN: 24-Sep-1955 (67 y.o. [name], [name] Primary Care Provider: [name] Other Clinician: Referring Provider: Treating Provider/Extender: [name], [name] in Treatment: 10 Verbal / Phone Orders: No Diagnosis Coding Follow-up Appointments ppointment in 1 week. - W/ Dr. Heber Langley Return A Anesthetic (In clinic) Topical Lidocaine 5% applied to wound bed Cellular or Tissue Based Products Cellular or Tissue Based Product Type: - Run IVR for Grafix for left dorsal foot- pending Grafix # 1 applied 06/11/22 Grafix # 2 applied 06/18/22 Grafix # 3 applied 06/25/22 Bathing/ Shower/ Hygiene May shower with protection but do not get wound dressing(s) wet. Edema Control - Lymphedema / SCD / Other Elevate legs to the level of the heart or above for 30 minutes daily and/or when sitting, a frequency of: - 3-4 times a day throughout the day. Avoid standing for long periods of time. Off-Loading Open toe surgical shoe to: - wear when standing or walking. Home Health New wound care orders this week; continue  Home Health for wound care. May utilize formulary equivalent dressing for wound treatment orders unless otherwise specified. - skin sub placed on dorsal foot. do not remove. only change outer dressing. Prisma and ABD pad placed on lateral foot wound just for this week. May change this daily. Dressing changes to be completed by [place] on Monday / Wednesday / Friday except when patient has scheduled visit at [facility]. Other Home Health Orders/Instructions: - [facility]. Hyperbaric Oxygen Therapy Evaluate for HBO Therapy Indication: - Wagner Grade III to left lateral foot 2.5 ATA for 90 Minutes with 2 Five (5) Minute A Breaks ir Total Number of Treatments: - 40 One treatments per day (delivered Monday through Friday unless otherwise specified in Special Instructions below): CORDARRELL, SANE A ([id number]) [id number].pdf Page 6 of 13 Finger stick Blood Glucose Pre- and Post- HBOT Treatment. Follow Hyperbaric Oxygen Glycemia Protocol Afrin (Oxymetazoline HCL) 0.05% nasal spray - 1 spray in both nostrils daily as needed prior to HBO treatment for difficulty clearing ears Wound Treatment Wound #4 - Foot Wound Laterality: Dorsal, Left Cleanser: Wound Cleanser (Home Health) 1 x Per Day/30 Days Discharge Instructions: Cleanse the wound with wound cleanser prior to applying a clean dressing using gauze sponges, not tissue or cotton balls. Peri-Wound Care: Skin Prep (Home Health) 1 x Per Day/30 Days Discharge Instructions: Use skin prep as directed Prim Dressing: Grafix 1 x Per Day/30 Days ary Discharge Instructions: Secure with adaptic and steri strips Secondary Dressing: ABD Pad, 8x10 (Home Health) 1 x Per Day/30 Days Discharge Instructions: Apply over primary dressing as directed. Secured With: [employer], 4.5x3.1 (in/yd) (Home Health) 1 x Per Day/30 Days Discharge Instructions: Secure with Kerlix as directed. Secured With: 26M Medipore H  Soft Cloth Surgical T ape, 4 x 10 (in/yd) (Home Health) 1 x Per Day/30 Days Discharge Instructions: Secure with tape as directed. Wound #5 - Foot Wound Laterality: Left, Lateral Cleanser: Wound Cleanser (Home Health) 1 x Per Day/30 Days Discharge Instructions: Cleanse the wound with wound cleanser prior to applying a clean dressing using gauze sponges, not tissue or cotton balls. Peri-Wound Care: Skin Prep (Home Health) 1 x Per Day/30 Days Discharge Instructions: Use skin prep as directed Prim Dressing: Promogran Prisma Matrix, 4.34 (sq in) (silver collagen) 1 x Per Day/30 Days ary  Discharge Instructions: Moisten collagen with saline or hydrogel Secondary Dressing: ABD Pad, 8x10 (Home Health) 1 x Per Day/30 Days Discharge Instructions: Apply over primary dressing as directed. Secured With: [employer], 4.5x3.1 (in/yd) (Home Health) 1 x Per Day/30 Days Discharge Instructions: Secure with Kerlix as directed. Secured With: 58M Medipore H Soft Cloth Surgical T ape, 4 x 10 (in/yd) (Home Health) 1 x Per Day/30 Days Discharge Instructions: Secure with tape as directed. GLYCEMIA INTERVENTIONS PROTOCOL PRE-HBO GLYCEMIA INTERVENTIONS ACTION INTERVENTION Obtain pre-HBO capillary blood glucose (ensure 1 physician order is in chart). A. Notify HBO physician and await physician orders. 2 If result is 70 mg/dl or below: B. If the result meets the hospital definition of a critical result, follow hospital policy. A. Give patient an 8 ounce Glucerna Shake, an 8 ounce Ensure, or 8 ounces of a Glucerna/Ensure equivalent dietary supplement*. B. Wait 30 minutes. If result is 71 mg/dl to 130 mg/dl: C. Retest patients capillary blood glucose (CBG). D. If result greater than or equal to 110 mg/dl, proceed with HBO. If result less than 110 mg/dl, notify HBO physician and consider holding HBO. If result is 131 mg/dl to 249 mg/dl: A. Proceed with HBO. A. Notify HBO physician and await  physician orders. B. It is recommended to hold HBO and do If result is 250 mg/dl or greater: blood/urine ketone testing. C. If the result meets the hospital definition of a critical result, follow hospital policy. POST-HBO GLYCEMIA INTERVENTIONS ACTION INTERVENTION Obtain post HBO capillary blood glucose (ensure 1 physician order is in chart). A. Notify HBO physician and await physician orders. 2 If result is 70 mg/dl or below: B. If the result meets the hospital definition of a critical result, follow hospital policy. A. Give patient an 8 ounce Glucerna Shake, an 8 ounce Ensure, or 8 ounces of a RONDRICK, BARREIRA A ([id number]) [phone].pdf Page 7 of 13 Glucerna/Ensure equivalent dietary supplement*. B. Wait 15 minutes for symptoms of If result is 71 mg/dl to 100 mg/dl: hypoglycemia (i.e. nervousness, anxiety, sweating, chills, clamminess, irritability, confusion, tachycardia or dizziness). C. If patient asymptomatic, discharge patient. If patient symptomatic, repeat capillary blood glucose (CBG) and notify HBO physician. If result is 101 mg/dl to 249 mg/dl: A. Discharge patient. A. Notify HBO physician and await physician orders. B. It is recommended to do blood/urine ketone If result is 250 mg/dl or greater: testing. C. If the result meets the hospital definition of a critical result, follow hospital policy. *Juice or candies are NOT equivalent products. If patient refuses the Glucerna or Ensure, please consult the hospital dietitian for an appropriate substitute. Electronic Signature(s) Signed: 06/25/2022 2:00:17 PM By: [name] DO Entered By: [name] on 06/25/2022 12:34:22 -------------------------------------------------------------------------------- Problem List Details Patient Name: Date of Service: [name], [name] 06/25/2022 10:45 A M Medical Record Number: [id number] Patient Account Number: [id number] Date of  Birth/Sex: Treating RN: 07-May-1956 (67 y.o. M) Primary Care Provider: [name] Other Clinician: Referring Provider: Treating Provider/Extender: [name], [name] in Treatment: 10 Active Problems ICD-10 Encounter Code Description Active Date MDM Diagnosis L97.522 Non-pressure chronic ulcer of other part of left foot with fat layer exposed 04/14/2022 No Yes M86.172 Other acute osteomyelitis, left ankle and foot 05/08/2022 No Yes L97.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 specified 04/14/2022 No Yes severity E11.621 Type 2 diabetes mellitus with foot ulcer 04/14/2022 No Yes E11.42 Type 2 diabetes mellitus with diabetic polyneuropathy 04/14/2022 No Yes Inactive Problems Resolved Problems  Electronic Signature(s) Signed: 06/25/2022 2:00:17 PM By: [name] DO Entered By: [name] on 06/25/2022 12:25:39 [name] ([id number]) [id number] Page 8 of 13 -------------------------------------------------------------------------------- Progress Note Details Patient Name: Date of Service: [name], [name] 06/25/2022 10:45 A M Medical Record Number: [id number] Patient Account Number: [id number] Date of Birth/Sex: Treating RN: 07/21/1955 (66 y.o. M) Primary Care Provider: [name] Other Clinician: Referring Provider: Treating Provider/Extender: [name], [name] in Treatment: 10 Subjective Chief Complaint Information obtained from Patient 10/16/20; patient is here with a substantial wound on his right foot in the setting of a recent transmetatarsal amputation. 04/14/2022; referral from podiatry for postsurgical wound of nonhealing partial left fifth ray amputation History of Present Illness (HPI) ADMISSION 10/16/20 This is a 67 year old man who is a type II diabetic. Initially seen by Dr. Unk Lightning of vein and vascular on 09/10/2021 for bilateral lower extremity rest  pain right greater than left. His ABIs demonstrated monophasic waveforms at the ankles bilaterally with depressed toe pressures. His ABI in the right was 0.5 and on the left 0.28. There were no obtainable waveforms for TBI's. He underwent angiography on 09/10/2021 and had findings of severe PAD with 95% stenosis of the distal SFA. He also had peroneal and posterior tibial arteries occluded with significant collateralization in the calf there was reconstitution of the posterior tibial artery in the distal third of the calf. Ostia of the anterior tibial artery was not appreciated. He underwent a angioplasty of the superficial femoral artery. He required admission to hospital from 09/12/2021 through 09/22/2021 with right foot cellulitis and sepsis. On 3/13 he underwent a right below-knee popliteal to posterior tibial bypass using a greater saphenous vein. 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 He was discharged to [facility] skilled facility. He arrives in clinic today with a large wound area over the entirety of his amputation site extending proximally. The attempted flap closure of the amputation site and his TMA has failed and the wound extends under the attempt to closure. Still some sutures in place. There is an odor but he is not systemically unwell. He is due for follow-up arterial studies and has an appointment with vascular surgery on 10/28/2021. They are using wet-to-dry dressings at [facility]. Past medical history includes type 2 diabetes with peripheral neuropathy, hypertension, obstructive sleep apnea 4/20; patient presents for follow-up. He is being discharged from his facility at [facility] today. They have been doing Dakin's wet-to-dry dressings. He states that his fiance can help with dressing changes. He is getting [facility] home [facility] to come out 3 times a week once he leaves the facility. He is scheduled  to see vein and vascular on 5/12. He has not seen the wound himself. He puts covers over his face for wound exams. He currently denies systemic signs of infection. 4/28; patient admitted to the clinic 2 weeks ago with a dehisced right TMA in the setting of type 2 diabetes with significant PAD. He is status post revascularization. He has been discharged from the nursing home he was at and now is at home using Dakin's wet-to-dry dressings daily he has home health. He denies any systemic signs of infection 5/4; patient presents for follow-up. His fiance and home health have been changing his dressings. He currently denies systemic signs of infection. He continues to put sheet covers over his face during our wound encounters because he does not want a look  at the wound. 5/25; Patient presents for follow-up. He missed his last clinic appointment. He had a bone biopsy and culture done at last clinic visit that showed acute osteomyelitis with growth of E. coli, stenotrophomonas maltophilia and enteroccocus faecalis. A referral to infectious disease was made. He has not heard from them yet. He has been using Dakin's wet-to-dry dressings. He is now more comfortable with looking at the wound bed. 6/6; the patient comes into clinic today with 2 wounds on the left foot which apparently have been there for several months. This was not known to assess but was known by Dr. Donzetta Matters. In fact he underwent an angiogram yesterday. He had a laser atherectomy of the left posterior tibial artery with a 3 mm balloon angioplasty, also a stent of the last left SFA. 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 This is a lot better than the last time I saw it. He has been using Dakin's wet-to-dry. He has an appointment with infectious disease for a bone biopsy which showed  E. coli, stenotrophomonas and Enterococcus faecalis. The appointment is for tomorrow 04/14/2022 Mr. Damin Salido ajuddin is an uncontrolled type 2 diabetic on insulin that presents with a left foot ulcer. On 12/18/2021 he required a left fifth ray amputation. He had revision of this site on 6/17 by Dr. Posey Pronto, podiatry. He states he has had a wound VAC on this area for the past 3 weeks. It is unclear what the prior wound care has been. He has a history of osteomyelitis to this area and was treated with IV and oral antibiotics For 6 weeks by Dr. Linus Salmons of infectious disease. At his last follow-up on 02/20/2022 his CRP was trending down and antibiotics were stopped. He also has a history of peripheral arterial disease status post stenting to the left SFA and balloon angioplasty to the left posterior tibial artery On 12/2021. He has follow-up with vein and vascular at the end of the month. He also has a wound to the dorsal aspect of the left foot that he has been placing Betadine on. 10/19; patient presents for follow-up. He had a bone biopsy of the left foot at last clinic visit showed osteomyelitis. The bone culture showed Enterobacter cloacae, Enterococcus faecalis, Staphylococcus aureus, viridans group strep, actinotigum schaelii. He has been referred to infectious disease And has an appointment on October 25. He has been using Dakin's wet-to-dry dressing to the lateral left foot wound and Hydrofera Blue and Medihoney to the dorsal foot wound. He has no issues or complaints today. He denies systemic signs of infection. 10/26; patient presents for follow-up. He saw Dr. Linus Salmons yesterday with infectious disease and has been prescribed doxycycline and Levaquin for his chronic osteomyelitis. He has been using Dakin's wet-to-dry dressings to the lateral left foot wound and Medihoney and Hydrofera Blue to the dorsal wound. We discussed a skin graft for the dorsal foot wound and he would like to proceed with insurance  verification. 11/3; patient with a wound on the dorsal left foot and a substantial postsurgical area on the lateral left foot fifth ray amputation. He is also followed by Dr. Alton Revere of infectious disease. He had a bone biopsy from the left lateral foot that showed acute osteomyelitis. Swab culture showed multiple organisms. He has also been revascularized by infectious disease status  post left SFA stenting and tibial laser atherectomy and angioplasty. An MRI of the left foot had previously shown osteomyelitis of the fifth met head we have been using Dakin's wet-to-dry to the left lateral foot and Medihoney and Hydrofera Blue to the ALEKSA, CATTERTON A ([id number]) [id number] Page 9 of 13 wound on the dorsal foot. 11/13; HBO treatment was discussed at last clinic visit and patient would like to proceed with this. He has orientation today. He has been approved for Grafix. We have been using Hydrofera Blue and Medihoney to the left dorsal foot and collagen to the left lateral foot. Patient has home health that comes in for dressing changes. He currently has no issues or complaints today. 12/7; patient missed his last clinic appointment. He was last seen 3 weeks ago. He is currently off of antibiotics per ID. He completed a prolonged treatment. He has been using collagen to the lateral foot wound. He has been using Medihoney and Hydrofera Blue to the dorsal foot wound. He has been doing well with hyperbaric oxygen therapy. He has no issues or complaints today. He denies signs of of infection. 12/14; patient presents for follow-up. We have been using Grafix to the wound beds. He has had trouble equalizing the pressure in his ears during HBO treatments. It was recommended he follow-up with ENT He does not have an appointment yet. He currently denies signs of infection. . 12/21; patient presents for follow-up. He was seen by ENT and had tubes placed. He is continued HBO without  issues. We have been using Grafix to the wound beds. There is been improvement in wound healing. Patient History Information obtained from Patient, Chart. Family History Diabetes - Mother,Siblings, Heart Disease - Siblings, Hypertension - Siblings, Stroke - Mother, No family history of Cancer, Hereditary Spherocytosis, Kidney Disease, Lung Disease, Seizures, Thyroid Problems, Tuberculosis. Social History Former smoker, Marital Status - Single, Alcohol Use - Never, Drug Use - No History, Caffeine Use - Daily. Medical History Eyes Denies history of Cataracts, Glaucoma, Optic Neuritis Respiratory Patient has history of Asthma, Sleep Apnea Denies history of Aspiration, Chronic Obstructive Pulmonary Disease (COPD), Pneumothorax, Tuberculosis Cardiovascular Patient has history of Hypertension, Peripheral Venous Disease Denies history of Angina, Arrhythmia, Congestive Heart Failure, Coronary Artery Disease, Deep Vein Thrombosis, Hypotension, Myocardial Infarction, Peripheral Arterial Disease, Phlebitis, Vasculitis Endocrine Patient has history of Type II Diabetes Denies history of Type I Diabetes Integumentary (Skin) Denies history of History of Burn Musculoskeletal Patient has history of Osteomyelitis Denies history of Gout, Rheumatoid Arthritis, Osteoarthritis Neurologic Patient has history of Neuropathy Denies history of Dementia, Quadriplegia, Paraplegia, Seizure Disorder Hospitalization/Surgery History - 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 Posey Pronto. - aortogram 12/08/2021 Dr. Virl Cagey [facility]. Medical A Surgical History Notes nd Psychiatric PTSD Objective Constitutional respirations regular, non-labored and within target range for patient.. Vitals Time Taken: 11:26 AM, Height: 72 in, Weight: 220 lbs, BMI: 29.8, Temperature: 98.2 F, Pulse: 88 bpm, Respiratory Rate: 18 breaths/min, Blood Pressure: 132/77 mmHg, Capillary Blood Glucose: 218 mg/dl. Cardiovascular 2+  dorsalis pedis/posterior tibialis pulses. Psychiatric pleasant and cooperative. General Notes: Left foot: T the lateral aspect there is an open wound with granulation tissue and nonviable tissue. No probing to bone . T the dorsal aspect o o there is an open wound with granulation tissue and non viable tissue present. No signs of surrounding soft tissue infection. Integumentary (Hair, Skin) Wound #4 status is Open. Original cause of wound was Gradually Appeared. The date acquired was: 09/03/2021. The wound has  been in treatment 10 weeks. The wound is located on the Left,Dorsal Foot. The wound measures 0.9cm length x 0.4cm width x 0.1cm depth; 0.283cm^2 area and 0.028cm^3 volume. There is Fat Layer (Subcutaneous Tissue) exposed. There is no tunneling or undermining noted. There is a medium amount of serosanguineous drainage noted. The wound margin is distinct with the outline attached to the wound base. There is large (67-100%) pink, pale granulation within the wound bed. There is no necrotic tissue within the wound bed. The periwound skin appearance had no abnormalities noted for moisture. The periwound skin appearance had no abnormalities noted for color. The periwound skin appearance did not exhibit: Callus, Crepitus, Excoriation, Induration, Rash, Scarring. Periwound temperature was noted as KHALEL, ALMS A ([id number]) [id number] Page 10 of 13 No Abnormality. Wound #5 status is Open. Original cause of wound was Surgical Injury. The date acquired was: 12/20/2021. The wound has been in treatment 10 weeks. The wound is located on the Left,Lateral Foot. The wound measures 5.8cm length x 1cm width x 0.4cm depth; 4.555cm^2 area and 1.822cm^3 volume. There is bone and Fat Layer (Subcutaneous Tissue) exposed. There is no tunneling or undermining noted. There is a medium amount of serosanguineous drainage noted. Foul odor after cleansing was noted. The wound margin is epibole.  There is large (67-100%) red, pink granulation within the wound bed. There is a small (1-33%) amount of necrotic tissue within the wound bed including Adherent Slough. The periwound skin appearance had no abnormalities noted for color. The periwound skin appearance exhibited: Callus, Maceration. The periwound skin appearance did not exhibit: Crepitus, Excoriation, Induration, Rash, Scarring, Dry/Scaly. Periwound temperature was noted as No Abnormality. Assessment Active Problems ICD-10 Non-pressure chronic ulcer of other part of left foot with fat layer exposed Other acute osteomyelitis, left ankle and foot Non-pressure chronic ulcer of other part of left foot with other specified severity Type 2 diabetes mellitus with foot ulcer Type 2 diabetes mellitus with diabetic polyneuropathy Patient's wounds have shown improvement in size and appearance since last clinic visit. Grafix was placed to the dorsal foot wound. There was not enough to place to the lateral foot wound. I recommended collagen here. Continue aggressive offloading. Continue HBO. Procedures Wound #4 Pre-procedure diagnosis of Wound #4 is a Diabetic Wound/Ulcer of the Lower Extremity located on the Left,Dorsal Foot .Severity of Tissue Pre Debridement is: Fat layer exposed. There was a Excisional Skin/Subcutaneous Tissue Debridement with a total area of 0.36 sq cm performed by [name], DO. With the following instrument(s): Curette to remove Viable and Non-Viable tissue/material. Material removed includes Subcutaneous Tissue and Slough and after achieving pain control using Lidocaine. No specimens were taken. A time out was conducted at 11:53, prior to the start of the procedure. A Minimum amount of bleeding was controlled with Pressure. The procedure was tolerated well with a pain level of 0 throughout and a pain level of 0 following the procedure. Post Debridement Measurements: 0.9cm length x 0.4cm width x 0.1cm depth;  0.028cm^3 volume. Character of Wound/Ulcer Post Debridement is improved. Severity of Tissue Post Debridement is: Fat layer exposed. Post procedure Diagnosis Wound #4: Same as Pre-Procedure Pre-procedure diagnosis of Wound #4 is a Diabetic Wound/Ulcer of the Lower Extremity located on the Left,Dorsal Foot. A skin graft procedure using a bioengineered skin substitute/cellular or tissue based product was performed by [name], DO with the following instrument(s): Forceps and Scissors. Grafix prime was applied and secured with Steri-Strips. 2 sq cm of product was utilized and 0 sq cm  was wasted. Post Application, adaptic was applied. A Time Out was conducted at 12:14, prior to the start of the procedure. The procedure was tolerated well with a pain level of 0 throughout and a pain level of 0 following the procedure. Post procedure Diagnosis Wound #4: Same as Pre-Procedure . Wound #5 Pre-procedure diagnosis of Wound #5 is a Diabetic Wound/Ulcer of the Lower Extremity located on the Left,Lateral Foot .Severity of Tissue Pre Debridement is: Fat layer exposed. There was a Excisional Skin/Subcutaneous Tissue Debridement with a total area of 5.8 sq cm performed by [name], DO. With the following instrument(s): Curette to remove Viable and Non-Viable tissue/material. Material removed includes Subcutaneous Tissue and Slough and after achieving pain control using Lidocaine. No specimens were taken. A time out was conducted at 11:53, prior to the start of the procedure. A Minimum amount of bleeding was controlled with Pressure. The procedure was tolerated well with a pain level of 0 throughout and a pain level of 0 following the procedure. Post Debridement Measurements: 5.8cm length x 1cm width x 0.4cm depth; 1.822cm^3 volume. Character of Wound/Ulcer Post Debridement is improved. Severity of Tissue Post Debridement is: Fat layer exposed. Post procedure Diagnosis Wound #5: Same as  Pre-Procedure Plan Follow-up Appointments: Return Appointment in 1 week. - W/ Dr. Heber Millbury Anesthetic: (In clinic) Topical Lidocaine 5% applied to wound bed Cellular or Tissue Based Products: Cellular or Tissue Based Product Type: - Run IVR for Grafix for left dorsal foot- pending Grafix # 1 applied 06/11/22 Grafix # 2 applied 06/18/22 Grafix # 3 applied 06/25/22 Bathing/ Shower/ Hygiene: May shower with protection but do not get wound dressing(s) wet. Edema Control - Lymphedema / SCD / Other: Elevate legs to the level of the heart or above for 30 minutes daily and/or when sitting, a frequency of: - 3-4 times a day throughout the day. Avoid standing for long periods of time. Off-Loading: Open toe surgical shoe to: - wear when standing or walking. Home Health: New wound care orders this week; continue Home Health for wound care. May utilize formulary equivalent dressing for wound treatment orders unless otherwise specified. - skin sub placed on dorsal foot. do not remove. only change outer dressing. Prisma and ABD pad placed on lateral foot wound just for this LERRY, CORDREY A ([id number]) [id number] Page 11 of 13 week. May change this daily. Dressing changes to be completed by [facility] on Monday / Wednesday / Friday except when patient has scheduled visit at [facility]. Other Home Health Orders/Instructions: - [facility]. Hyperbaric Oxygen Therapy: Evaluate for HBO Therapy Indication: - Wagner Grade III to left lateral foot 2.5 ATA for 90 Minutes with 2 Five (5) Minute Air Breaks T Number of Treatments: - 40 otal One treatments per day (delivered Monday through Friday unless otherwise specified in Special Instructions below): Finger stick Blood Glucose Pre- and Post- HBOT Treatment. Follow Hyperbaric Oxygen Glycemia Protocol Afrin (Oxymetazoline HCL) 0.05% nasal spray - 1 spray in both nostrils daily as needed prior to HBO treatment for  difficulty clearing ears WOUND #4: - Foot Wound Laterality: Dorsal, Left Cleanser: Wound Cleanser (Home Health) 1 x Per Day/30 Days Discharge Instructions: Cleanse the wound with wound cleanser prior to applying a clean dressing using gauze sponges, not tissue or cotton balls. Peri-Wound Care: Skin Prep (Home Health) 1 x Per Day/30 Days Discharge Instructions: Use skin prep as directed Prim Dressing: Grafix 1 x Per Day/30 Days ary Discharge Instructions: Secure with adaptic and steri strips Secondary  Dressing: ABD Pad, 8x10 (Home Health) 1 x Per Day/30 Days Discharge Instructions: Apply over primary dressing as directed. Secured With: [employer], 4.5x3.1 (in/yd) (Home Health) 1 x Per Day/30 Days Discharge Instructions: Secure with Kerlix as directed. Secured With: 55M Medipore H Soft Cloth Surgical T ape, 4 x 10 (in/yd) (Home Health) 1 x Per Day/30 Days Discharge Instructions: Secure with tape as directed. WOUND #5: - Foot Wound Laterality: Left, Lateral Cleanser: Wound Cleanser (Home Health) 1 x Per Day/30 Days Discharge Instructions: Cleanse the wound with wound cleanser prior to applying a clean dressing using gauze sponges, not tissue or cotton balls. Peri-Wound Care: Skin Prep (Home Health) 1 x Per Day/30 Days Discharge Instructions: Use skin prep as directed Prim Dressing: Promogran Prisma Matrix, 4.34 (sq in) (silver collagen) 1 x Per Day/30 Days ary Discharge Instructions: Moisten collagen with saline or hydrogel Secondary Dressing: ABD Pad, 8x10 (Home Health) 1 x Per Day/30 Days Discharge Instructions: Apply over primary dressing as directed. Secured With: [employer], 4.5x3.1 (in/yd) (Home Health) 1 x Per Day/30 Days Discharge Instructions: Secure with Kerlix as directed. Secured With: 55M Medipore H Soft Cloth Surgical T ape, 4 x 10 (in/yd) (Home Health) 1 x Per Day/30 Days Discharge Instructions: Secure with tape as directed. 1. Grafix 2. Collagen 3.  Aggressive offloadingoosurgical shoe Electronic Signature(s) Signed: 06/25/2022 2:00:17 PM By: [name] DO Entered By: [name] on 06/25/2022 12:35:49 -------------------------------------------------------------------------------- HxROS Details Patient Name: Date of Service: [name], [name] 06/25/2022 10:45 A M Medical Record Number: [id number] Patient Account Number: [id number] Date of Birth/Sex: Treating RN: 01-15-1956 (67 y.o. M) Primary Care Provider: [name] Other Clinician: Referring Provider: Treating Provider/Extender: [name] in Treatment: 10 Information Obtained From Patient Chart Eyes Medical History: Negative for: Cataracts; Glaucoma; Optic Neuritis Respiratory Medical History: Positive for: Asthma; Sleep Apnea Negative for: Aspiration; Chronic Obstructive Pulmonary Disease (COPD); Pneumothorax; Tuberculosis Cardiovascular KEADEN, GUNNOE A ([id number]) [id number] Page 12 of 13 Medical History: Positive for: Hypertension; Peripheral Venous Disease Negative for: Angina; Arrhythmia; Congestive Heart Failure; Coronary Artery Disease; Deep Vein Thrombosis; Hypotension; Myocardial Infarction; Peripheral Arterial Disease; Phlebitis; Vasculitis Endocrine Medical History: Positive for: Type II Diabetes Negative for: Type I Diabetes Time with diabetes: 20 years Treated with: Insulin, Oral agents Blood sugar tested every day: Yes Tested : 4x day Integumentary (Skin) Medical History: Negative for: History of Burn Musculoskeletal Medical History: Positive for: Osteomyelitis Negative for: Gout; Rheumatoid Arthritis; Osteoarthritis Neurologic Medical History: Positive for: Neuropathy Negative for: Dementia; Quadriplegia; Paraplegia; Seizure Disorder Psychiatric Medical History: Past Medical History Notes: PTSD Immunizations Pneumococcal Vaccine: Received Pneumococcal  Vaccination: No Implantable Devices None Hospitalization / Surgery History Type of Hospitalization/Surgery 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 Posey Pronto aortogram 12/08/2021 Dr. Virl Cagey [facility] Family and Social History Cancer: No; Diabetes: Yes - Mother,Siblings; Heart Disease: Yes - Siblings; Hereditary Spherocytosis: No; Hypertension: Yes - Siblings; Kidney Disease: No; Lung Disease: No; Seizures: No; Stroke: Yes - Mother; Thyroid Problems: No; Tuberculosis: No; Former smoker; Marital Status - Single; Alcohol Use: Never; Drug Use: No History; Caffeine Use: Daily; Financial Concerns: No; Food, Clothing or Shelter Needs: No; Support System Lacking: No; Transportation Concerns: No Electronic Signature(s) Signed: 06/25/2022 2:00:17 PM By: [name] DO Entered By: [name] on 06/25/2022 12:33:18 -------------------------------------------------------------------------------- SuperBill Details Patient Name: Date of Service: [name], [place] [name] 06/25/2022 Medical Record Number: [id number] Patient Account Number: [id number] Date of Birth/Sex: Treating RN: Oct 31, 1955 (67 y.o. [name], [name]  Primary Care Provider: [name] Other Clinician: Referring Provider: Treating Provider/Extender: [name], [name] in Treatment: 10 LORAS, GRIESHOP A ([id number]) [id number] Page 13 of 13 Diagnosis Coding ICD-10 Codes Code Description [phone] Non-pressure chronic ulcer of other part of left foot with fat layer exposed M86.172 Other acute osteomyelitis, left ankle and foot L97.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 specified severity E11.621 Type 2 diabetes mellitus with foot ulcer E11.42 Type 2 diabetes mellitus with diabetic polyneuropathy Facility Procedures : CPT4 Code: [id number] Description: Q2229- Grafix PL 16 mm disc (2 units) Modifier: Quantity: 2 : CPT4 Code:  [id number] Description: 41740 - SKIN SUB GRAFT FACE/NK/HF/G ICD-10 Diagnosis Description L97.522 Non-pressure chronic ulcer of other part of left foot with fat layer exposed Modifier: Quantity: 1 : CPT4 Code: [id number] Description: 63149 - DEB SUBQ TISSUE 20 SQ CM/< ICD-10 Diagnosis Description 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

## 2022-07-09 NOTE — Progress Notes (Signed)
[name], [name] ([id number]) [id number] Page 1 of 8 Visit Report for 06/25/2022 Arrival Information Details Patient Name: Date of Service: [name], [name] 06/25/2022 10:45 [name] Medical Record Number: [id number] Patient Account Number: [id number] Date of Birth/Sex: Treating RN: 11/03/1955 (67 y.o. M) Primary Care Dashia Caldeira: [name] Other Clinician: Referring Cailan Antonucci: Treating Vollie Brunty/Extender: [name], [name] in Treatment: 10 Visit Information History Since Last Visit Added or deleted any medications: No Patient Arrived: Wheel Chair Any new allergies or adverse reactions: No Arrival Time: 11:25 Had [name] fall or experienced change in No Accompanied By: self activities of daily living that may affect Transfer Assistance: None risk of falls: Patient Identification Verified: Yes Signs or symptoms of abuse/neglect since last visito No Secondary Verification Process Completed: Yes Hospitalized since last visit: No Patient Requires Transmission-Based Precautions: No Implantable device outside of the clinic excluding No Patient Has Alerts: Yes cellular tissue based products placed in the center Patient Alerts: ESBL;MRSA since last visit: Has Dressing in Place as Prescribed: Yes Pain Present Now: No Electronic Signature(s) Signed: 06/26/2022 8:46:25 AM By: [name] Entered By: [name] on 06/25/2022 11:26:03 -------------------------------------------------------------------------------- Encounter Discharge Information Details Patient Name: Date of Service: [name], [name]. 06/25/2022 10:45 [name] Medical Record Number: [id number] Patient Account Number: [id number] Date of Birth/Sex: Treating RN: January 20, 1956 (67 y.o. [name], [name] Primary Care Banner Huckaba: [name] Other Clinician: Referring Patrina Andreas: Treating Kashawn Dirr/Extender: [name] in Treatment: 10 Encounter Discharge  Information Items Post Procedure Vitals Discharge Condition: Stable Temperature (F): 98.7 Ambulatory Status: Ambulatory Pulse (bpm): 74 Discharge Destination: Home Respiratory Rate (breaths/min): 17 Transportation: Private Auto Blood Pressure (mmHg): 120/80 Accompanied By: self Schedule Follow-up Appointment: Yes Clinical Summary of Care: Patient Declined Electronic Signature(s) Signed: 07/08/2022 5:35:40 PM By: Rhae Hammock RN Entered By: Rhae Hammock on 06/25/2022 12:44:33 [name] ([id number]) [id number] Page 2 of 8 -------------------------------------------------------------------------------- Lower Extremity Assessment Details Patient Name: Date of Service: [name], [name] 06/25/2022 10:45 [name] Medical Record Number: [id number] Patient Account Number: [id number] Date of Birth/Sex: Treating RN: [birth year] (67 y.o. M) Primary Care Mae Denunzio: [name] Other Clinician: Referring Lesly Pontarelli: Treating Donise Woodle/Extender: [name], [name] in Treatment: 10 Edema Assessment Assessed: [Left: No] [Right: No] Edema: [Left: Ye] [Right: s] Calf Left: Right: Point of Measurement: 36 cm From Medial Instep 38.2 cm Ankle Left: Right: Point of Measurement: 9 cm From Medial Instep 23 cm Electronic Signature(s) Signed: 06/26/2022 8:46:25 AM By: [name] Entered By: [name] on 06/25/2022 11:35:49 -------------------------------------------------------------------------------- Multi Wound Chart Details Patient Name: Date of Service: [name], [name]. 06/25/2022 10:45 [name] Medical Record Number: [id number] Patient Account Number: [id number] Date of Birth/Sex: Treating RN: 1956-05-26 (67 y.o. M) Primary Care Mykeisha Dysert: [name] Other Clinician: Referring Edgar Reisz: Treating Tatjana Turcott/Extender: [name], [name] in Treatment: 10 Vital Signs Height(in): 72 Capillary Blood  Glucose(mg/dl): 218 Weight(lbs): 220 Pulse(bpm): 32 Body Mass Index(BMI): 29.8 Blood Pressure(mmHg): 132/77 Temperature(F): 98.2 Respiratory Rate(breaths/min): 18 [4:Photos:] [[name]:[name]] Left, Dorsal Foot Left, Lateral Foot [name] Wound Location: Gradually Appeared Surgical Injury [name] Wounding Event: Diabetic Wound/Ulcer of the Lower Diabetic Wound/Ulcer of the Lower [name] Primary Etiology: Extremity Extremity [name] Open Surgical Wound [name] Secondary Etiology: Asthma, Sleep Apnea, Hypertension, Asthma, Sleep Apnea, Hypertension, [name] Comorbid HistoryLARICO, [name] ([id number]) [id number] Page 3 of 8 Peripheral Venous Disease, Type II Peripheral Venous Disease, Type II Diabetes, Osteomyelitis, Neuropathy Diabetes, Osteomyelitis, Neuropathy 09/03/2021 12/20/2021 [name] Date  Acquired: 10 10 [name] Weeks of Treatment: Open Open [name] Wound Status: No No [name] Wound Recurrence: 0.9x0.4x0.1 5.8x1x0.4 [name] Measurements L x W x D (cm) 0.283 4.555 [name] [name] (cm) : rea 0.028 1.822 [name] Volume (cm) : 88.00% 54.40% [name] % Reduction in [name] rea: 94.10% 63.60% [name] % Reduction in Volume: Grade 3 Grade 3 [name] Classification: Medium Medium [name] Exudate [name] mount: Serosanguineous Serosanguineous [name] Exudate Type: red, brown red, brown [name] Exudate Color: No Yes [name] Foul Odor [name] Cleansing: fter [name] No [name] Odor [name] nticipated Due to Product Use: Distinct, outline attached Epibole [name] Wound Margin: Large (67-100%) Large (67-100%) [name] Granulation [name] mount: Pink, Pale Red, Pink [name] Granulation Quality: None Present (0%) Small (1-33%) [name] Necrotic [name] mount: Fat Layer (Subcutaneous Tissue): Yes Fat Layer (Subcutaneous Tissue): Yes [name] Exposed Structures: Fascia: No Bone: Yes Tendon: No Fascia: No Muscle: No Tendon: No Joint: No Muscle: No Bone: No Joint: No Small (1-33%) Small (1-33%) [name] Epithelialization: Debridement - Excisional Debridement - Excisional  [name] Debridement: Pre-procedure Verification/Time Out 11:53 11:53 [name] Taken: Lidocaine Lidocaine [name] Pain Control: Subcutaneous, Slough Subcutaneous, Slough [name] Tissue Debrided: Skin/Subcutaneous Tissue Skin/Subcutaneous Tissue [name] Level: 0.36 5.8 [name] Debridement [name] (sq cm): rea Curette Curette [name] Instrument: Minimum Minimum [name] Bleeding: Pressure Pressure [name] Hemostasis [name] chieved: 0 0 [name] Procedural Pain: 0 0 [name] Post Procedural Pain: Procedure was tolerated well Procedure was tolerated well [name] Debridement Treatment Response: 0.9x0.4x0.1 5.8x1x0.4 [name] Post Debridement Measurements L x W x D (cm) 0.028 1.822 [name] Post Debridement Volume: (cm) Excoriation: No Callus: Yes [name] Periwound Skin Texture: Induration: No Excoriation: No Callus: No Induration: No Crepitus: No Crepitus: No Rash: No Rash: No Scarring: No Scarring: No Maceration: No Maceration: Yes [name] Periwound Skin Moisture: Dry/Scaly: No Dry/Scaly: No Atrophie Blanche: No Atrophie Blanche: No [name] Periwound Skin Color: Cyanosis: No Cyanosis: No Ecchymosis: No Ecchymosis: No Erythema: No Erythema: No Hemosiderin Staining: No Hemosiderin Staining: No Mottled: No Mottled: No Pallor: No Pallor: No Rubor: No Rubor: No No Abnormality No Abnormality [name] Temperature: Cellular or Tissue Based Product Debridement [name] Procedures Performed: Debridement Treatment Notes Electronic Signature(s) Signed: 06/25/2022 2:00:17 PM By: [name] DO Entered By: [name] on 06/25/2022 12:25:45 -------------------------------------------------------------------------------- Multi-Disciplinary Care Plan Details Patient Name: Date of Service: [name], [name]. 06/25/2022 10:45 [name] Medical Record Number: [id number] Patient Account Number: [id number] [name], [name] ([id number]) [phone].pdf Page 4 of 8 Date of Birth/Sex: Treating RN: [birth year] (67 y.o. [name], [name] Primary Care Sagar Tengan: Other Clinician: [name] Referring Sahara Fujimoto: Treating Reyana Leisey/Extender: [name], [name] in Treatment: 10 Active Inactive Osteomyelitis Nursing Diagnoses: Infection: osteomyelitis Goals: Diagnostic evaluation for osteomyelitis completed as ordered Date Initiated: 04/23/2022 Target Resolution Date: 07/03/2022 Goal Status: Active Signs and symptoms for osteomyelitis will be recognized and promptly addressed Date Initiated: 04/23/2022 Target Resolution Date: 07/04/2022 Goal Status: Active Interventions: Assess for signs and symptoms of osteomyelitis resolution every visit Provide education on osteomyelitis Screen for HBO Treatment Activities: Biopsy : 04/16/2022 Consult for HBO : 04/23/2022 Surgical debridement : 04/23/2022 Systemic antibiotics : 04/23/2022 T ordered outside of clinic : 04/23/2022 est Notes: Wound/Skin Impairment Nursing Diagnoses: Knowledge deficit related to ulceration/compromised skin integrity Goals: Patient/caregiver will verbalize understanding of skin care regimen Date Initiated: 04/14/2022 Target Resolution Date: 07/04/2022 Goal Status: Active Interventions: Assess patient/caregiver ability to perform ulcer/skin care regimen upon admission and as needed Assess ulceration(s) every visit Provide education on ulcer and skin care Notes: Electronic Signature(s) Signed: 07/08/2022 5:35:40  PM By: Rhae Hammock RN Entered By: Rhae Hammock on 06/25/2022 11:05:47 -------------------------------------------------------------------------------- Pain Assessment Details Patient Name: Date of Service: [name], [name] 06/25/2022 10:45 [name] Medical Record Number: [id number] Patient Account Number: [id number] Date of Birth/Sex: Treating RN: 1955-09-02 (67 y.o. M) Primary Care Tonie Elsey: [name] Other Clinician: Referring Maryanne Huneycutt: Treating Abiha Lukehart/Extender: [name], [name] in Treatment: [address] TAEV[place], MCFERRAN [name] ([id number]) [id number] Page 5 of 8 Location of Pain Severity and Description of Pain Patient Has Paino No Site Locations Pain Management and Medication Current Pain Management: Electronic Signature(s) Signed: 06/26/2022 8:46:25 AM By: [name] Entered By: [name] on 06/25/2022 11:35:29 -------------------------------------------------------------------------------- Patient/Caregiver Education Details Patient Name: Date of Service: [name], [name]. 12/21/2023andnbsp10:45 [name] Medical Record Number: [id number] Patient Account Number: [id number] Date of Birth/Gender: Treating RN: May 08, 1956 (67 y.o. [name] Primary Care Physician: [name] Other Clinician: Referring Physician: Treating Physician/Extender: [name] in Treatment: 10 Education Assessment Education Provided To: Patient Education Topics Provided Wound/Skin Impairment: Methods: Explain/Verbal Responses: Reinforcements needed, State content correctly [employer]) Signed: 07/08/2022 5:35:40 PM By: Rhae Hammock RN Entered By: Rhae Hammock on 06/25/2022 11:05:58 -------------------------------------------------------------------------------- Wound Assessment Details Patient Name: Date of Service: [name], [name]. 06/25/2022 10:45 [name] Cyndie Chime [name] ([id number]) [id number] Page 6 of 8 Medical Record Number: [id number] Patient Account Number: [id number] Date of Birth/Sex: Treating RN: [birth year] (67 y.o. M) Primary Care Jaideep Pollack: [name] Other Clinician: Referring Kirkland Figg: Treating Zair Borawski/Extender: [name], [name] in Treatment: 10 Wound Status Wound Number: 4 Primary Diabetic Wound/Ulcer of the Lower Extremity Etiology: Wound Location: Left, Dorsal Foot Wound  Open Wounding Event: Gradually Appeared Status: Date Acquired: 09/03/2021 Comorbid Asthma, Sleep Apnea, Hypertension, Peripheral Venous Disease, Weeks Of Treatment: 10 History: Type II Diabetes, Osteomyelitis, Neuropathy Clustered Wound: No Photos Wound Measurements Length: (cm) 0.9 Width: (cm) 0.4 Depth: (cm) 0.1 Area: (cm) 0.283 Volume: (cm) 0.028 % Reduction in Area: 88% % Reduction in Volume: 94.1% Epithelialization: Small (1-33%) Tunneling: No Undermining: No Wound Description Classification: Grade 3 Wound Margin: Distinct, outline attached Exudate Amount: Medium Exudate Type: Serosanguineous Exudate Color: red, brown Foul Odor After Cleansing: No Slough/Fibrino Yes Wound Bed Granulation Amount: Large (67-100%) Exposed Structure Granulation Quality: Pink, Pale Fascia Exposed: No Necrotic Amount: None Present (0%) Fat Layer (Subcutaneous Tissue) Exposed: Yes Tendon Exposed: No Muscle Exposed: No Joint Exposed: No Bone Exposed: No Periwound Skin Texture Texture Color No Abnormalities Noted: No No Abnormalities Noted: Yes Callus: No Temperature / Pain Crepitus: No Temperature: No Abnormality Excoriation: No Induration: No Rash: No Scarring: No Moisture No Abnormalities Noted: Yes Electronic Signature(s) Signed: 06/26/2022 8:46:25 AM By: [name] Entered By: [name] on 06/25/2022 11:39:35 [name] ([id number]) [id number] Page 7 of 8 -------------------------------------------------------------------------------- Wound Assessment Details Patient Name: Date of Service: [name], [name] 06/25/2022 10:45 [name] Medical Record Number: [id number] Patient Account Number: [id number] Date of Birth/Sex: Treating RN: [birth year] (67 y.o. M) Primary Care Niomi Valent: [name] Other Clinician: Referring Vivi Piccirilli: Treating Alyze Lauf/Extender: [name], [name] in Treatment: 10 Wound Status Wound  Number: 5 Primary Diabetic Wound/Ulcer of the Lower Extremity Etiology: Wound Location: Left, Lateral Foot Secondary Open Surgical Wound Wounding Event: Surgical Injury Etiology: Date Acquired: 12/20/2021 Wound Open Weeks Of Treatment: 10 Status: Clustered Wound: No Comorbid Asthma, Sleep Apnea, Hypertension, Peripheral Venous Disease, History: Type II Diabetes, Osteomyelitis, Neuropathy Photos Wound Measurements Length: (cm) 5 Width: (  cm) 1 Depth: (cm) 0 Area: (cm) Volume: (cm) .8 % Reduction in Area: 54.4% % Reduction in Volume: 63.6% .4 Epithelialization: Small (1-33%) 4.555 Tunneling: No 1.822 Undermining: No Wound Description Classification: Grade 3 Wound Margin: Epibole Exudate Amount: Medium Exudate Type: Serosanguineous Exudate Color: red, brown Foul Odor After Cleansing: Yes Due to Product Use: No Slough/Fibrino Yes Wound Bed Granulation Amount: Large (67-100%) Exposed Structure Granulation Quality: Red, Pink Fascia Exposed: No Necrotic Amount: Small (1-33%) Fat Layer (Subcutaneous Tissue) Exposed: Yes Necrotic Quality: Adherent Slough Tendon Exposed: No Muscle Exposed: No Joint Exposed: No Bone Exposed: Yes Periwound Skin Texture Texture Color No Abnormalities Noted: No No Abnormalities Noted: Yes Callus: Yes Temperature / Pain Crepitus: No Temperature: No Abnormality Excoriation: No Induration: No Rash: No Scarring: No Moisture No Abnormalities Noted: No Dry / Scaly: No [name], [name] ([id number]) [id number].pdf Page 8 of 8 Maceration: Yes Electronic Signature(s) Signed: 06/26/2022 8:46:25 AM By: [name] Entered By: [name] on 06/25/2022 11:40:00 -------------------------------------------------------------------------------- Vitals Details Patient Name: Date of Service: [name], [name]. 06/25/2022 10:45 [name] Medical Record Number: [id number] Patient Account Number: [id number] Date of  Birth/Sex: Treating RN: [birth year] (67 y.o. M) Primary Care Alferd Obryant: [name] Other Clinician: Referring Mervil Wacker: Treating Mohab Ashby/Extender: [name], [name] in Treatment: 10 Vital Signs Time Taken: 11:26 Temperature (F): 98.2 Height (in): 72 Pulse (bpm): 88 Weight (lbs): 220 Respiratory Rate (breaths/min): 18 Body Mass Index (BMI): 29.8 Blood Pressure (mmHg): 132/77 Capillary Blood Glucose (mg/dl): 218 Reference Range: 80 - 120 mg / dl Electronic Signature(s) Signed: 06/26/2022 8:46:25 AM By: [name] Entered By: [name] on 06/25/2022 11:35:21

## 2022-07-09 NOTE — Progress Notes (Signed)
DEMITRIS, POKORNY A ([id number]) [id number] Page 1 of 2 Visit Report for 07/09/2022 Problem List Details Patient Name: Date of Service: [name], [name] 07/09/2022 10:00 A M Medical Record Number: [id number] Patient Account Number: [id number] Date of Birth/Sex: Treating RN: [birth year] (67 y.o. [name] Primary Care Provider: [name] Other Clinician: Valeria Batman Referring Provider: Treating Provider/Extender: [name] in Treatment: 12 Active Problems ICD-10 Encounter Code Description Active Date MDM Diagnosis [id number] Non-pressure chronic ulcer of other part of left foot with fat 04/14/2022 No Yes layer exposed M86.172 Other acute osteomyelitis, left ankle and foot 05/08/2022 No Yes L97.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 04/14/2022 No Yes specified severity E11.621 Type 2 diabetes mellitus with foot ulcer 04/14/2022 No Yes E11.42 Type 2 diabetes mellitus with diabetic polyneuropathy 04/14/2022 No Yes Inactive Problems Resolved Problems Electronic Signature(s) Signed: 07/09/2022 12:15:50 PM By: Valeria Batman EMT Signed: 07/09/2022 3:33:48 PM By: [name] DO Entered By: Valeria Batman on 07/09/2022 12:15:50 -------------------------------------------------------------------------------- SuperBill Details Patient Name: Date of Service: [name], [name] 07/09/2022 Medical Record Number: [id number] Patient Account Number: [id number] Date of Birth/Sex: Treating RN: [birth year] (67 y.o. [name] Primary Care Provider: [name] Other Clinician: Valeria Batman Referring Provider: Treating Provider/Extender: [name] in Treatment: [address] Diagnosis Coding YADEN, SEITH A ([id number]) [id number] Page 2 of 2 ICD-10 Codes Code Description [id number] Non-pressure chronic ulcer of other part of left foot  with fat layer exposed M86.172 Other acute osteomyelitis, left ankle and foot L97.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 specified severity E11.621 Type 2 diabetes mellitus with foot ulcer E11.42 Type 2 diabetes mellitus with diabetic polyneuropathy Facility Procedures : CPT4 Code Description: [id number] G0277-(Facility Use Only) HBOT full body chamber, 103mn , ICD-10 Diagnosis Description E11.621 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.522 Non-pressure chronic ulcer of other part of left foot with f L97.528  Non-pressure chronic ulcer of other part of left foot with o M86.172 Other acute osteomyelitis, left ankle and foot Modifier: at layer exposed ther specified se Quantity: 4 verity Physician Procedures : CPT4 Code Description Modifier [id number] 831 0844- WC PHYS HYPERBARIC OXYGEN THERAPY ICD-10 Diagnosis Description E11.621 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.528  Non-pressure chronic ulcer of other part of left foot with other specified se M86.172 Other acute osteomyelitis, left ankle and foot Quantity: 1 verity Electronic Signature(s) Signed: 07/09/2022 12:15:45 PM By: GValeria BatmanEMT Signed: 07/09/2022 3:33:48 PM By: HKalman ShanDO Entered By: GValeria Batmanon 07/09/2022 12:15:44

## 2022-07-09 NOTE — Progress Notes (Signed)
[place], WAGGLE [name] ([id number]) [id number] Page 1 of 9 Visit Report for 06/11/2022 Arrival Information Details Patient Name: Date of Service: [name] [name], [name] [name]. 06/11/2022 10:15 [name] Medical Record Number: [id number] Patient Account Number: [id number] Date of Birth/Sex: Treating RN: May 31, 1956 (67 y.o. M) Primary Care [name] [name]: [name] [name] Other Clinician: Referring [name] [name]: Treating [name] [name]/Extender: [name] [name], [name] [name]: 8 Visit Information History Since Last Visit Added or deleted any medications: No Patient Arrived: Wheel Chair Any new allergies or adverse reactions: No Arrival Time: 10:42 Had [name] fall or experienced change in No Accompanied By: wife activities of daily living that may affect Transfer Assistance: None risk of falls: Patient Identification Verified: Yes Signs or symptoms of abuse/neglect since last visito No Secondary Verification Process Completed: Yes Hospitalized since last visit: No Patient Requires Transmission-Based Precautions: No Implantable device outside of the clinic excluding No Patient Has Alerts: No cellular tissue based products placed in the [name] since last visit: Has Dressing in Place as Prescribed: Yes Pain Present Now: Yes Electronic Signature(s) Signed: 06/17/2022 12:00:46 PM By: [name] [name] Entered By: [name] [name] on 06/11/2022 10:43:16 -------------------------------------------------------------------------------- Encounter Discharge Information Details Patient Name: Date of Service: [name] [name], [name] [name] [name] [name]. 06/11/2022 10:15 [name] Medical Record Number: [id number] Patient Account Number: [id number] Date of Birth/Sex: Treating RN: Jan 10, 1956 (67 y.o. [name] [name], [name] [name] Primary Care Hollan Philipp: [name] [name] Other Clinician: Referring Donita Newland: Treating [name] [name]/Extender: [name] [name], [name] [name]: 8 Encounter Discharge Information Items Post  Procedure Vitals Discharge Condition: Stable Temperature (F): 98.7 Ambulatory Status: Wheelchair Pulse (bpm): 74 Discharge Destination: Home Respiratory Rate (breaths/min): 17 Transportation: Private Auto Blood Pressure (mmHg): 120/80 Accompanied By: self Schedule Follow-up Appointment: Yes Clinical Summary of Care: Patient Declined Electronic Signature(s) Signed: 07/08/2022 5:35:40 PM By: [name] Hammock RN Entered By: [name] [name] on 06/11/2022 11:44:14 [name] [name] ([id number]) [id number] Page 2 of 9 -------------------------------------------------------------------------------- Lower Extremity Assessment Details Patient Name: Date of Service: [name] [name], [name] [name] [name] [name]. 06/11/2022 10:15 [name] Medical Record Number: [id number] Patient Account Number: [id number] Date of Birth/Sex: Treating RN: 1956/05/08 (67 y.o. M) Primary Care [name] [name]: [name] [name] Other Clinician: Referring [name] [name]: Treating [name] [name]/Extender: [name] [name], [name] [name]: 8 Edema Assessment Assessed: [Left: No] [Right: No] Edema: [Left: Ye] [Right: s] Calf Left: Right: Point of Measurement: 36 cm From Medial Instep 37 cm Ankle Left: Right: Point of Measurement: 9 cm From Medial Instep 23 cm Electronic Signature(s) Signed: 06/17/2022 12:00:46 PM By: [name] [name] Entered By: [name] [name] on 06/11/2022 10:54:11 -------------------------------------------------------------------------------- Multi Wound Chart Details Patient Name: Date of Service: [name] [name], [name] [name] [name] [name]. 06/11/2022 10:15 [name] Medical Record Number: [id number] Patient Account Number: [id number] Date of Birth/Sex: Treating RN: Dec 01, 1955 (67 y.o. M) Primary Care [name] [name]: [name] [name] Other Clinician: Referring [name] [name]: Treating [name] [name]/Extender: [name] [name], [name] [name]: 8 Vital Signs Height(in): 72 Capillary Blood Glucose(mg/dl): 108 Weight(lbs):  220 Pulse(bpm): 55 Body Mass Index(BMI): 29.8 Blood Pressure(mmHg): 128/75 Temperature(F): 97.6 Respiratory Rate(breaths/min): 18 [4:Photos:] [[name]:[name]] Left, Dorsal Foot Left, Lateral Foot [name] Wound Location: Gradually Appeared Surgical Injury [name] Wounding Event: Diabetic Wound/Ulcer of the Lower Diabetic Wound/Ulcer of the Lower [name] Primary Etiology: Extremity Extremity [name] Open Surgical Wound [name] Secondary Etiology: Asthma, Sleep Apnea, Hypertension, Asthma, Sleep Apnea, Hypertension, [name] Comorbid HistoryNYZAIAH, KAI [name] ([id number]) [id number] Page 3 of 9 Peripheral Venous Disease, Type II Peripheral Venous Disease, Type II Diabetes, Osteomyelitis, Neuropathy Diabetes, Osteomyelitis, Neuropathy 09/03/2021 12/20/2021 [name] Date Acquired: 8 8  [name] Weeks of [name]: Open Open [name] Wound Status: No No [name] Wound Recurrence: 1.3x0.7x0.2 5.8x1.4x0.4 [name] Measurements L x W x D (cm) 0.715 6.377 [name] (cm) : rea 0.143 2.551 [name] Volume (cm) : 69.70% 36.20% [name] % Reduction in [name] rea: 69.60% 49.00% [name] % Reduction in Volume: Grade 3 Grade 3 [name] Classification: Medium Medium [name] Exudate [name] mount: Serosanguineous Serosanguineous [name] Exudate Type: red, brown red, brown [name] Exudate Color: Distinct, outline attached Epibole [name] Wound Margin: Large (67-100%) Large (67-100%) [name] Granulation [name] mount: Pink, Pale Red, Pink [name] Granulation Quality: None Present (0%) Small (1-33%) [name] Necrotic [name] mount: Fat Layer (Subcutaneous Tissue): Yes Fat Layer (Subcutaneous Tissue): Yes [name] Exposed Structures: Fascia: No Bone: Yes Tendon: No Fascia: No Muscle: No Tendon: No Joint: No Muscle: No Bone: No Joint: No Small (1-33%) Small (1-33%) [name] Epithelialization: [name] Debridement - Excisional [name] Debridement: Pre-procedure Verification/Time Out [name] 11:15 [name] Taken: [name] Lidocaine [name] Pain Control: [name] Callus, Subcutaneous, Slough [name] Tissue  Debrided: [name] Skin/Subcutaneous Tissue [name] Level: [name] 8.12 [name] Debridement [name] (sq cm): rea [name] Curette [name] Instrument: [name] Minimum [name] Bleeding: [name] Pressure [name] Hemostasis [name] chieved: [name] 0 [name] Procedural Pain: [name] 0 [name] Post Procedural Pain: [name] Procedure was tolerated well [name] Debridement [name] Response: [name] 5.8x1.4x0.4 [name] Post Debridement Measurements L x W x D (cm) [name] 2.551 [name] Post Debridement Volume: (cm) Excoriation: No Callus: Yes [name] Periwound Skin Texture: Induration: No Excoriation: No Callus: No Induration: No Crepitus: No Crepitus: No Rash: No Rash: No Scarring: No Scarring: No Maceration: No Maceration: No [name] Periwound Skin Moisture: Dry/Scaly: No Dry/Scaly: No Atrophie Blanche: No Atrophie Blanche: No [name] Periwound Skin Color: Cyanosis: No Cyanosis: No Ecchymosis: No Ecchymosis: No Erythema: No Erythema: No Hemosiderin Staining: No Hemosiderin Staining: No Mottled: No Mottled: No Pallor: No Pallor: No Rubor: No Rubor: No No Abnormality No Abnormality [name] Temperature: Cellular or Tissue Based Product Cellular or Tissue Based Product [name] Procedures Performed: Debridement [name] Notes Wound #4 (Foot) Wound Laterality: Dorsal, Left Cleanser Wound Cleanser Discharge Instruction: Cleanse the wound with wound cleanser prior to applying [name] clean dressing using gauze sponges, not tissue or cotton balls. Peri-Wound Care Skin Prep Discharge Instruction: Use skin prep as directed Topical Primary Dressing Grafix Discharge Instruction: Secure with adaptic and steri strips Secondary Dressing ABD Pad, 8x10 Discharge Instruction: Apply over primary dressing as directed. [name] [name], [name] [name] ([id number]) [id number] Page 4 of 9 Secured With [employer], 4.5x3.1 (in/yd) Discharge Instruction: Secure with Kerlix as directed. 74M Medipore H Soft Cloth Surgical T ape, 4 x 10 (in/yd) Discharge  Instruction: Secure with tape as directed. Compression Wrap Compression Stockings Add-Ons Wound #5 (Foot) Wound Laterality: Left, Lateral Cleanser Wound Cleanser Discharge Instruction: Cleanse the wound with wound cleanser prior to applying [name] clean dressing using gauze sponges, not tissue or cotton balls. Peri-Wound Care Skin Prep Discharge Instruction: Use skin prep as directed Topical Primary Dressing Grafix Discharge Instruction: Secure with adaptic and steri strips Drawtex Discharge Instruction: Apply drawtex over the adaptic. Patient will change everything from the drawtex to the outer dressing only. Secondary Dressing ABD Pad, 8x10 Discharge Instruction: Apply over primary dressing as directed. Secured With [employer], 4.5x3.1 (in/yd) Discharge Instruction: Secure with Kerlix as directed. 74M Medipore H Soft Cloth Surgical T ape, 4 x 10 (in/yd) Discharge Instruction: Secure with tape as directed. Compression Wrap Compression Stockings Add-Ons Electronic Signature(s) Signed: 06/11/2022 2:30:41 PM By: [name] DO Entered By: [name] on 06/11/2022 11:51:13 --------------------------------------------------------------------------------  Multi-Disciplinary Care Plan Details Patient Name: Date of Service: [name] [name], [name] [name]. 06/11/2022 10:15 [name] Medical Record Number: [id number] Patient Account Number: [id number] Date of Birth/Sex: Treating RN: [birth year] (67 y.o. [name] [name], [name] [name] Primary Care Kiyra Slaubaugh: [name] [name] Other Clinician: Referring Aston Lawhorn: Treating Wyley Hack/Extender: [name] [name], [name] [name]: 8 Active Inactive Osteomyelitis [name] [name], [name] [name] ([id number]) [phone].pdf Page 5 of 9 Nursing Diagnoses: Infection: osteomyelitis Goals: Diagnostic evaluation for osteomyelitis completed as ordered Date Initiated: 04/23/2022 Target Resolution Date: 07/03/2022 Goal Status: Active Signs  and symptoms for osteomyelitis will be recognized and promptly addressed Date Initiated: 04/23/2022 Target Resolution Date: 06/25/2022 Goal Status: Active Interventions: Assess for signs and symptoms of osteomyelitis resolution every visit Provide education on osteomyelitis Screen for HBO [name] Activities: Biopsy : 04/16/2022 Consult for HBO : 04/23/2022 Surgical debridement : 04/23/2022 Systemic antibiotics : 04/23/2022 T ordered outside of clinic : 04/23/2022 est Notes: Wound/Skin Impairment Nursing Diagnoses: Knowledge deficit related to ulceration/compromised skin integrity Goals: Patient/caregiver will verbalize understanding of skin care regimen Date Initiated: 04/14/2022 Target Resolution Date: 06/24/2022 Goal Status: Active Interventions: Assess patient/caregiver ability to perform ulcer/skin care regimen upon admission and as needed Assess ulceration(s) every visit Provide education on ulcer and skin care Notes: Electronic Signature(s) Signed: 07/08/2022 5:35:40 PM By: [name] Hammock RN Entered By: [name] [name] on 06/11/2022 11:22:53 -------------------------------------------------------------------------------- Pain Assessment Details Patient Name: Date of Service: [name] [name], [name] [name] [name] [name]. 06/11/2022 10:15 [name] Medical Record Number: [id number] Patient Account Number: [id number] Date of Birth/Sex: Treating RN: [birth year] (67 y.o. M) Primary Care Keithen Capo: [name] [name] Other Clinician: Referring Majestic Brister: Treating Kanye Depree/Extender: [name] [name], [name] [name]: 8 Active Problems Location of Pain Severity and Description of Pain Patient Has Paino Yes Site Locations Pain LocationHEROLD, [name] [name] ([id number]) [id number] Page 6 of 9 Pain Location: Generalized Pain Rate the pain. Current Pain Level: 7 Pain Management and Medication Current Pain Management: Electronic Signature(s) Signed: 06/17/2022  12:00:46 PM By: [name] [name] Entered By: [name] [name] on 06/11/2022 10:53:53 -------------------------------------------------------------------------------- Patient/Caregiver Education Details Patient Name: Date of Service: [name] [name], [name] [name] [name] [name]. 12/7/2023andnbsp10:15 [place] Record Number: [id number] Patient Account Number: [id number] Date of Birth/Gender: Treating RN: 05/22/1956 (67 y.o. [name] Primary Care Physician: [name] [name] Other Clinician: Referring Physician: Treating Physician/Extender: [name] [name] in [name]: 8 Education Assessment Education Provided To: Patient Education Topics Provided Infection: Methods: Explain/Verbal Responses: Reinforcements needed, State content correctly Electronic Signature(s) Signed: 07/08/2022 5:35:40 PM By: [name] Hammock RN Entered By: [name] [name] on 06/11/2022 11:23:17 -------------------------------------------------------------------------------- Wound Assessment Details Patient Name: Date of Service: [name] [name], [name] [name] [name] [name]. 06/11/2022 10:15 [name] Medical Record Number: [id number] Patient Account Number: [id number] Date of Birth/Sex: Treating RN: [birth year] (67 y.o. M) Primary Care Catherine Oak: [name] [name] Other Clinician: Referring Areen Trautner: Treating Cherri Yera/Extender: [name] [name], [name] [name] ([id number]) [id number] Page 7 of 9 Weeks in [name]: 8 Wound Status Wound Number: 4 Primary Diabetic Wound/Ulcer of the Lower Extremity Etiology: Wound Location: Left, Dorsal Foot Wound Open Wounding Event: Gradually Appeared Status: Date Acquired: 09/03/2021 Comorbid Asthma, Sleep Apnea, Hypertension, Peripheral Venous Disease, Weeks Of [name]: 8 History: Type II Diabetes, Osteomyelitis, Neuropathy Clustered Wound: No Photos Wound Measurements Length: (cm) 1.3 Width: (cm) 0.7 Depth: (cm) 0.2 Area: (cm) 0.715 Volume: (cm)  0.143 % Reduction in Area: 69.7% % Reduction in Volume: 69.6% Epithelialization: Small (1-33%) Tunneling: No Undermining: No Wound Description Classification: Grade 3 Wound Margin: Distinct, outline attached Exudate Amount:  Medium Exudate Type: Serosanguineous Exudate Color: red, brown Foul Odor After Cleansing: No Slough/Fibrino Yes Wound Bed Granulation Amount: Large (67-100%) Exposed Structure Granulation Quality: Pink, Pale Fascia Exposed: No Necrotic Amount: None Present (0%) Fat Layer (Subcutaneous Tissue) Exposed: Yes Tendon Exposed: No Muscle Exposed: No Joint Exposed: No Bone Exposed: No Periwound Skin Texture Texture Color No Abnormalities Noted: No No Abnormalities Noted: Yes Callus: No Temperature / Pain Crepitus: No Temperature: No Abnormality Excoriation: No Induration: No Rash: No Scarring: No Moisture No Abnormalities Noted: Yes Electronic Signature(s) Signed: 06/17/2022 12:00:46 PM By: [name] [name] Entered By: [name] [name] on 06/11/2022 10:56:29 Wound Assessment Details -------------------------------------------------------------------------------- [name] [name] ([id number]) [id number] Page 8 of 9 Patient Name: Date of Service: [name] [name], [name] [name] [name] [name]. 06/11/2022 10:15 [name] Medical Record Number: [id number] Patient Account Number: [id number] Date of Birth/Sex: Treating RN: 10-04-1955 (67 y.o. M) Primary Care [name] [name]: [name] [name] Other Clinician: Referring Yoshito Gaza: Treating Darvis Croft/Extender: [name] [name], [name] [name]: 8 Wound Status Wound Number: 5 Primary Diabetic Wound/Ulcer of the Lower Extremity Etiology: Wound Location: Left, Lateral Foot Secondary Open Surgical Wound Wounding Event: Surgical Injury Etiology: Date Acquired: 12/20/2021 Wound Open Weeks Of [name]: 8 Status: Clustered Wound: No Comorbid Asthma, Sleep Apnea, Hypertension, Peripheral Venous Disease, History:  Type II Diabetes, Osteomyelitis, Neuropathy Photos Wound Measurements Length: (cm) 5.8 % Reduction in Area: 36.2% Width: (cm) 1.4 % Reduction in Volume: 49% Depth: (cm) 0.4 Epithelialization: Small (1-33%) Area: (cm) 6.377 Tunneling: No Volume: (cm) 2.551 Undermining: No Wound Description Classification: Grade 3 Foul Odor After Cleansing: No Wound Margin: Epibole Slough/Fibrino Yes Exudate Amount: Medium Exudate Type: Serosanguineous Exudate Color: red, brown Wound Bed Granulation Amount: Large (67-100%) Exposed Structure Granulation Quality: Red, Pink Fascia Exposed: No Necrotic Amount: Small (1-33%) Fat Layer (Subcutaneous Tissue) Exposed: Yes Necrotic Quality: Adherent Slough Tendon Exposed: No Muscle Exposed: No Joint Exposed: No Bone Exposed: Yes Periwound Skin Texture Texture Color No Abnormalities Noted: No No Abnormalities Noted: Yes Callus: Yes Temperature / Pain Crepitus: No Temperature: No Abnormality Excoriation: No Induration: No Rash: No Scarring: No Moisture No Abnormalities Noted: No Dry / Scaly: No Maceration: No Electronic Signature(s) Signed: 06/17/2022 12:00:46 PM By: [name] [name] Entered By: [name] [name] on 06/11/2022 10:57:09 [name] [name] ([id number]) [id number] Page 9 of 9 -------------------------------------------------------------------------------- Vitals Details Patient Name: Date of Service: [name] [name], [name] [name] [name] [name]. 06/11/2022 10:15 [name] Medical Record Number: [id number] Patient Account Number: [id number] Date of Birth/Sex: Treating RN: [birth year] (68 y.o. M) Primary Care Lupie Sawa: [name] [name] Other Clinician: Referring Caris Cerveny: Treating [place]/Extender: [name] [name], [name] [name]: 8 Vital Signs Time Taken: 10:46 Temperature (F): 97.6 Height (in): 72 Pulse (bpm): 89 Weight (lbs): 220 Respiratory Rate (breaths/min): 18 Body Mass Index (BMI): 29.8 Blood  Pressure (mmHg): 128/75 Capillary Blood Glucose (mg/dl): 108 Reference Range: 80 - 120 mg / dl Electronic Signature(s) Signed: 06/17/2022 12:00:46 PM By: [name] [name] Entered By: [name] [name] on 06/11/2022 10:47:26

## 2022-07-09 NOTE — Progress Notes (Addendum)
[name] ([id number]) [id number] Page 1 of 2 Visit Report for 07/09/2022 Arrival Information Details Patient Name: Date of Service: [name], [name] 07/09/2022 10:00 [name] Medical Record Number: [id number] Patient Account Number: [id number] Date of Birth/Sex: Treating RN: 05/23/1956 (67 y.o. [name] Primary Care [name]: [name] Other Clinician: [name] Referring [name]: Treating [name]/Extender: [name] in Treatment: 12 Visit Information History Since Last Visit All ordered tests and consults were completed: Yes Patient Arrived: Wheel Chair Added or deleted any medications: No Arrival Time: 10:45 Any new allergies or adverse reactions: No Accompanied By: None Had [name] fall or experienced change in No Transfer Assistance: None activities of daily living that may affect Patient Identification Verified: Yes risk of falls: Secondary Verification Process Completed: Yes Signs or symptoms of abuse/neglect since last visito No Patient Requires Transmission-Based Precautions: No Hospitalized since last visit: No Patient Has Alerts: Yes Implantable device outside of the clinic excluding No Patient Alerts: ESBL;MRSA cellular tissue based products placed in the center since last visit: Pain Present Now: No Electronic Signature(s) Signed: 07/09/2022 10:46:59 AM By: [name] EMT Entered By: [name] on 07/09/2022 10:46:58 -------------------------------------------------------------------------------- Encounter Discharge Information Details Patient Name: Date of Service: [name], [place] [name] 07/09/2022 10:00 [name] Medical Record Number: [id number] Patient Account Number: [id number] Date of Birth/Sex: Treating RN: 03-19-1956 (67 y.o. [name] Primary Care [name]: [name] Other Clinician: [name] Referring Eddie Koc: Treating Alycea Segoviano/Extender: [name] in Treatment: 12 Encounter Discharge Information Items Discharge Condition: Stable Ambulatory Status: Wheelchair Discharge Destination: Home Transportation: Private Auto Accompanied By: None Schedule Follow-up Appointment: Yes Clinical Summary of Care: Electronic Signature(s) Signed: 07/09/2022 12:16:26 PM By: [name] EMT Entered By: [name] on 07/09/2022 12:16:26 [name] ([id number]) [id number] Page 2 of 2 -------------------------------------------------------------------------------- Vitals Details Patient Name: Date of Service: [name], [name] 07/09/2022 10:00 [name] Medical Record Number: [id number] Patient Account Number: [id number] Date of Birth/Sex: Treating RN: 03/26/1956 (67 y.o. [name] Primary Care [name]: [name] Other Clinician: [name] Referring [name]: Treating [name]/Extender: [name], [name] in Treatment: 12 Vital Signs Time Taken: 08:23 Capillary Blood Glucose (mg/dl): 219 Height (in): 72 Reference Range: 80 - 120 mg / dl Weight (lbs): 220 Body Mass Index (BMI): 29.8 Electronic Signature(s) Signed: 07/09/2022 10:48:20 AM By: [name] EMT Entered By: [name] on 07/09/2022 10:48:20

## 2022-07-09 NOTE — Progress Notes (Signed)
[name] [name] ([id number]) [id number] Page 1 of 13 Visit Report for 06/11/2022 Chief Complaint Document Details Patient Name: Date of Service: [name], [name]. 06/11/2022 10:15 [name] Medical Record Number: [id number] Patient Account Number: [id number] Date of Birth/Sex: Treating RN: Apr 29, 1956 (67 y.o. M) Primary Care Provider: Roselee [name] Other Clinician: Referring Provider: Treating Provider/Extender: [name], [name] in Treatment: 8 Information Obtained from: Patient Chief Complaint 10/16/20; patient is here with [name] substantial wound on his right foot in the setting of [name] recent transmetatarsal amputation. 04/14/2022; referral from podiatry for postsurgical wound of nonhealing partial left fifth ray amputation Electronic Signature(s) Signed: 06/11/2022 2:30:41 PM By: [name] DO Entered By: [name] on 06/11/2022 11:51:24 -------------------------------------------------------------------------------- Cellular or Tissue Based Product Details Patient Name: Date of Service: [name], [name]. 06/11/2022 10:15 [name] Medical Record Number: [id number] Patient Account Number: [id number] Date of Birth/Sex: Treating RN: 1956/02/09 (67 y.o. [name], [name] Primary Care Provider: Roselee [name] Other Clinician: Referring Provider: Treating Provider/Extender: [name] in Treatment: 8 Cellular or Tissue Based Product Type Wound #4 Left,Dorsal Foot Applied to: Performed By: Physician [name], DO Cellular or Tissue Based Product Type: Grafix prime Level of Consciousness (Pre-procedure): Awake and Alert Pre-procedure Verification/Time Out Yes - 11:20 Taken: Location: genitalia / hands / feet / multiple digits Wound Size (sq cm): 0.91 Product Size (sq cm): 6 Waste Size (sq cm): 4.5 Waste Reason: used for other wound Amount of Product Applied (sq cm): 1.5 Instrument Used: Forceps,  Scissors Lot #: [id number] Expiration Date: 09/30/2023 Fenestrated: No Reconstituted: Yes Solution Type: NS Solution Amount: 72m Lot #: [id number] Expiration Date: 11/02/2024 Secured: Yes Secured With: Steri-Strips Dressing Applied: Yes Primary Dressing: adaptic Procedural Pain: 0 Post Procedural Pain: 0 Response to Treatment: Procedure was tolerated well [name] ([id number] Page 2 of 13 Level of Consciousness (Post- Awake and Alert procedure): Post Procedure Diagnosis Same as Pre-procedure Electronic Signature(s) Signed: 06/11/2022 2:30:41 PM By: HKalman ShanDO Signed: 07/08/2022 5:35:40 PM By: BRhae HammockRN Entered By: BRhae Hammockon 06/11/2022 11:41:20 -------------------------------------------------------------------------------- Cellular or Tissue Based Product Details Patient Name: Date of Service: [name] [name]. 06/11/2022 10:15 [name] Medical Record Number: [id number] Account Number: [id number] of Birth/Sex: Treating RN: [birth year](67y.o. MBurnadette [name] Primary Care Provider: BRoselee NovaOther Clinician: Referring Provider: Treating Provider/Extender: HBurgess Amorin Treatment: 8 Cellular or Tissue Based Product Type Wound #5 Left,Lateral Foot Applied to: Performed By: Physician HKalman Shan DO Cellular or Tissue Based Product Type: Grafix prime Level of Consciousness (Pre-procedure): Awake and Alert Pre-procedure Verification/Time Out Yes - 11:20 Taken: Location: genitalia / hands / feet / multiple digits Wound Size (sq cm): 8.12 Product Size (sq cm): 4.5 Waste Size (sq cm): 0 Amount of Product Applied (sq cm): 4.5 Instrument Used: Forceps, Scissors Lot #: [id number] Date: 09/30/2023 Fenestrated: No Reconstituted: Yes Solution Type: NS Solution Amount: 333mLot #: [id number] Expiration Date: 11/02/2024 Secured:  Yes Secured With: Steri-Strips Dressing Applied: Yes Primary Dressing: adaptic Procedural Pain: 0 Post Procedural Pain: 0 Response to Treatment: Procedure was tolerated well Level of Consciousness (Post- Awake and Alert procedure): Post Procedure Diagnosis Same as Pre-procedure Electronic Signature(s) Signed: 06/11/2022 2:30:41 PM By: HoKalman ShanO Signed: 07/08/2022 5:35:40 PM By: BrRhae HammockN Entered By: BrRhae Hammockn 06/11/2022 11:41:44 [name] ([id number] Page 3 of 13 -------------------------------------------------------------------------------- Debridement Details Patient Name:  Date of Service: [name], [name]. 06/11/2022 10:15 [name] Medical Record Number: [id number] Patient Account Number: [id number] Date of Birth/Sex: Treating RN: [birth year] (67 y.o. [name], [name] Primary Care Provider: Roselee [name] Other Clinician: Referring Provider: Treating Provider/Extender: [name], [name] in Treatment: 8 Debridement Performed for Assessment: Wound #5 Left,Lateral Foot Performed By: Physician [name], DO Debridement Type: Debridement Severity of Tissue Pre Debridement: Fat layer exposed Level of Consciousness (Pre-procedure): Awake and Alert Pre-procedure Verification/Time Out Yes - 11:15 Taken: Start Time: 11:15 Pain Control: Lidocaine T Area Debrided (L x W): otal 5.8 (cm) x 1.4 (cm) = 8.12 (cm) Tissue and other material debrided: Viable, Non-Viable, Callus, Slough, Subcutaneous, Slough Level: Skin/Subcutaneous Tissue Debridement Description: Excisional Instrument: Curette Bleeding: Minimum Hemostasis Achieved: Pressure End Time: 11:15 Procedural Pain: 0 Post Procedural Pain: 0 Response to Treatment: Procedure was tolerated well Level of Consciousness (Post- Awake and Alert procedure): Post Debridement Measurements of Total Wound Length: (cm) 5.8 Width: (cm)  1.4 Depth: (cm) 0.4 Volume: (cm) 2.551 Character of Wound/Ulcer Post Debridement: Improved Severity of Tissue Post Debridement: Fat layer exposed Post Procedure Diagnosis Same as Pre-procedure Electronic Signature(s) Signed: 06/11/2022 2:30:41 PM By: [name] DO Signed: 07/08/2022 5:35:40 PM By: Rhae Hammock RN Entered By: Rhae Hammock on 06/11/2022 11:14:54 -------------------------------------------------------------------------------- HPI Details Patient Name: Date of Service: [name], [name]. 06/11/2022 10:15 [name] Medical Record Number: [id number] Patient Account Number: [id number] Date of Birth/Sex: Treating RN: 02/09/1956 (67 y.o. M) Primary Care Provider: Roselee [name] Other Clinician: Referring Provider: Treating Provider/Extender: [name], [name] in Treatment: 8 History of Present Illness HPI Description: ADMISSION 10/16/20 This is [name] 67 year old man who is [name] type II diabetic. Initially seen by Dr. Unk Lightning of vein and vascular on 09/10/2021 for bilateral lower extremity rest pain right greater than left. His ABIs demonstrated monophasic waveforms at the ankles bilaterally with depressed toe pressures. His ABI in the right was 0.5 and on the left 0.28. There were no obtainable waveforms for TBI's. He underwent angiography on 09/10/2021 and had findings of severe PAD with 95% stenosis of the distal SFA. He also had peroneal and posterior tibial arteries occluded with significant collateralization in the calf there was reconstitution of the posterior tibial artery in the distal third of the calf. Ostia of the anterior tibial artery was not appreciated. He underwent [name] angioplasty of the superficial femoral artery. He required admission to hospital from 09/12/2021 through 09/22/2021 with right foot cellulitis and sepsis. On 3/13 he underwent [name] right below-knee popliteal to RAJ, LANDRESS [name] ([id number]) [id number] Page 4  of 13 posterior tibial bypass using [name] greater saphenous vein. Ultimately however he required [name] right TMA by podiatry which I believe was done on 3/17 for progressive diabetic foot infection. He was discharged to [facility] skilled facility. He arrives in clinic today with [name] large wound area over the entirety of his amputation site extending proximally. The attempted flap closure of the amputation site and his TMA has failed and the wound extends under the attempt to closure. Still some sutures in place. There is an odor but he is not systemically unwell. He is due for follow-up arterial studies and has an appointment with vascular surgery on 10/28/2021. They are using wet-to-dry dressings at [facility]. Past medical history includes type 2 diabetes with peripheral neuropathy, hypertension, obstructive sleep apnea 4/20; patient presents for follow-up. He is being discharged from his facility at [facility] today. They have been doing Dakin's wet-to-dry  dressings. He states that his fiance can help with dressing changes. He is getting [facility] home [facility] to come out 3 times [name] week once he leaves the facility. He is scheduled to see vein and vascular on 5/12. He has not seen the wound himself. He puts covers over his face for wound exams. He currently denies systemic signs of infection. 4/28; patient admitted to the clinic 2 [name] ago with [name] dehisced right TMA in the setting of type 2 diabetes with significant PAD. He is status post revascularization. He has been discharged from the nursing home he was at and now is at home using [employer]'s wet-to-dry dressings daily he has home health. He denies any systemic signs of infection 5/4; patient presents for follow-up. His fiance and home health have been changing his dressings. He currently denies systemic signs of infection. He continues to put sheet covers over his face during our wound encounters because he does not want [name] look at the wound. 5/25; Patient  presents for follow-up. He missed his last clinic appointment. He had [name] bone biopsy and culture done at last clinic visit that showed acute osteomyelitis with growth of E. coli, stenotrophomonas maltophilia and enteroccocus faecalis. [name] referral to infectious disease was made. He has not heard from them yet. He has been using Dakin's wet-to-dry dressings. He is now more comfortable with looking at the wound bed. 6/6; the patient comes into clinic today with 2 wounds on the left foot which apparently have been there for several months. This was not known to assess but was known by Dr. Donzetta Matters. In fact he underwent an angiogram yesterday. He had [name] laser atherectomy of the left posterior tibial artery with [name] 3 mm balloon angioplasty, also [name] stent of the last left SFA. 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 This is [name] lot better than the last time I saw it. He has been using Dakin's wet-to-dry. He has an appointment with infectious disease for [name] bone biopsy which showed E. coli, stenotrophomonas and Enterococcus faecalis. The appointment is for tomorrow 04/14/2022 Mr. Deni Lefever ajuddin is an uncontrolled type 2 diabetic on insulin that presents with [name] left foot ulcer. On 12/18/2021 he required [name] left fifth ray amputation. He had revision of this site on 6/17 by Dr. Posey Pronto, podiatry. He states he has had [name] wound VAC on this area for the past 3 [name]. It is unclear what the prior wound care has been. He has [name] history of osteomyelitis to this area and was treated with IV and oral antibiotics For 6 [name] by Dr. Linus Salmons of infectious disease. At his last follow-up on 02/20/2022 his CRP was trending down and antibiotics were stopped. He also has [name] history of peripheral arterial disease status post stenting to the left SFA and balloon angioplasty to the left  posterior tibial artery On 12/2021. He has follow-up with vein and vascular at the end of the month. He also has [name] wound to the dorsal aspect of the left foot that he has been placing Betadine on. 10/19; patient presents for follow-up. He had [name] bone biopsy of the left foot at last clinic visit showed osteomyelitis. The bone culture showed Enterobacter cloacae, Enterococcus faecalis, Staphylococcus aureus, viridans group strep, actinotigum schaelii. He has been referred to infectious disease And has an appointment  on October 25. He has been using Dakin's wet-to-dry dressing to the lateral left foot wound and Hydrofera Blue and Medihoney to the dorsal foot wound. He has no issues or complaints today. He denies systemic signs of infection. 10/26; patient presents for follow-up. He saw Dr. Linus Salmons yesterday with infectious disease and has been prescribed doxycycline and Levaquin for his chronic osteomyelitis. He has been using Dakin's wet-to-dry dressings to the lateral left foot wound and Medihoney and Hydrofera Blue to the dorsal wound. We discussed [name] skin graft for the dorsal foot wound and he would like to proceed with insurance verification. 11/3; patient with [name] wound on the dorsal left foot and [name] substantial postsurgical area on the lateral left foot fifth ray amputation. He is also followed by Dr. Alton Revere of infectious disease. He had [name] bone biopsy from the left lateral foot that showed acute osteomyelitis. Swab culture showed multiple organisms. He has also been revascularized by infectious disease status post left SFA stenting and tibial laser atherectomy and angioplasty. An MRI of the left foot had previously shown osteomyelitis of the fifth met head we have been using Dakin's wet-to-dry to the left lateral foot and Medihoney and Hydrofera Blue to the wound on the dorsal foot. 11/13; HBO treatment was discussed at last clinic visit and patient would like to proceed with this. He has orientation today.  He has been approved for Grafix. We have been using Hydrofera Blue and Medihoney to the left dorsal foot and collagen to the left lateral foot. Patient has home health that comes in for dressing changes. He currently has no issues or complaints today. 12/7; patient missed his last clinic appointment. He was last seen 3 [name] ago. He is currently off of antibiotics per ID. He completed [name] prolonged treatment. He has been using collagen to the lateral foot wound. He has been using Medihoney and Hydrofera Blue to the dorsal foot wound. He has been doing well with hyperbaric oxygen therapy. He has no issues or complaints today. He denies signs of of infection. Electronic Signature(s) Signed: 06/11/2022 2:30:41 PM By: [name] DO Entered By: [name] on 06/11/2022 11:52:34 -------------------------------------------------------------------------------- Physical Exam Details Patient Name: Date of Service: [name], [name]. 06/11/2022 10:15 [name] Medical Record Number: [id number] Patient Account Number: [id number] Date of Birth/Sex: Treating RN: May 12, 1956 (68 y.o. M) Primary Care Provider: Roselee [name] Other Clinician: Referring Provider: Treating Provider/Extender: [name], [name] in Treatment: 8 CATON, POPOWSKI [name] ([id number]) [id number] Page 5 of 13 Constitutional respirations regular, non-labored and within target range for patient.. Cardiovascular 2+ dorsalis pedis/posterior tibialis pulses. Psychiatric pleasant and cooperative. Notes Left foot: T the lateral aspect there is an open wound with granulation tissue and nonviable tissue. No probing to bone . T the dorsal aspect there is an open o o wound with granulation tissue present. No signs of surrounding soft tissue infection. Electronic Signature(s) Signed: 06/11/2022 2:30:41 PM By: [name] DO Entered By: [name] on 06/11/2022  11:53:15 -------------------------------------------------------------------------------- Physician Orders Details Patient Name: Date of Service: [name], [name]. 06/11/2022 10:15 [name] Medical Record Number: [id number] Patient Account Number: [id number] Date of Birth/Sex: Treating RN: 08/31/1955 (67 y.o. [name], [name] Primary Care Provider: Roselee [name] Other Clinician: Referring Provider: Treating Provider/Extender: [name], [name] in Treatment: 8 Verbal / Phone Orders: No Diagnosis Coding Follow-up Appointments ppointment in 1 week. - w/ Dr. Heber [place] on Friday (pt. gets out of HBO at 10:00 so any  appt. time around that if possible) Return [name] ppointment in 2 [name]. - w/ Dr. Heber [place] on Thursday (pt. gets out of HBO at 10:00 so any appt. time around that if possible) Return [name] (have to schedule ahead d/t Grafix (skin sub) placement) Return appointment in 3 [name]. - w/ Dr. Heber [place] on Tuesday (pt. gets out of HBO at 10:00 so any appt. time around that if possible) (have to schedule ahead d/t Grafix (skin sub) placement) Anesthetic (In clinic) Topical Lidocaine 5% applied to wound bed Cellular or Tissue Based Products Cellular or Tissue Based Product Type: - Run IVR for Grafix for left dorsal foot- pending Grafix # 1 applied 06/11/22 Bathing/ Shower/ Hygiene May shower with protection but do not get wound dressing(s) wet. Edema Control - Lymphedema / SCD / Other Elevate legs to the level of the heart or above for 30 minutes daily and/or when sitting, [name] frequency of: - 3-4 times [name] day throughout the day. Avoid standing for long periods of time. Off-Loading Open toe surgical shoe to: - wear when standing or walking. Home Health New wound care orders this week; continue Home Health for wound care. May utilize formulary equivalent dressing for wound treatment orders unless otherwise specified. - Continue medihoney and hydrofera blue to dorsal wound. Prisma  with saline moisten gauzes backing. Dressing changes to be completed by [place] on Monday / Wednesday / Friday except when patient has scheduled visit at [facility]. Other Home Health Orders/Instructions: - [place] home [facility]. Hyperbaric Oxygen Therapy Evaluate for HBO Therapy Indication: - Wagner Grade III to left lateral foot 2.5 ATA for 90 Minutes with 2 Five (5) Minute [name] Breaks ir Total Number of Treatments: - 40 One treatments per day (delivered Monday through Friday unless otherwise specified in Special Instructions below): Finger stick Blood [name] Pre- and Post- HBOT Treatment. Follow Hyperbaric Oxygen [name] Protocol Afrin (Oxymetazoline HCL) 0.05% nasal spray - 1 spray in both nostrils daily as needed prior to HBO treatment for difficulty clearing ears [name], [name] ([id number]) [id number] Page 6 of 13 Wound Treatment Wound #4 - Foot Wound Laterality: Dorsal, Left Cleanser: Wound Cleanser (Home Health) 1 x Per Day/30 Days Discharge Instructions: Cleanse the wound with wound cleanser prior to applying [name] clean dressing using gauze sponges, not tissue or cotton balls. Peri-Wound Care: Skin Prep (Home Health) 1 x Per Day/30 Days Discharge Instructions: Use skin prep as directed Prim Dressing: Grafix 1 x Per Day/30 Days ary Discharge Instructions: Secure with adaptic and steri strips Secondary Dressing: ABD Pad, 8x10 (Home Health) 1 x Per Day/30 Days Discharge Instructions: Apply over primary dressing as directed. Secured With: [employer], 4.5x3.1 (in/yd) (Home Health) 1 x Per Day/30 Days Discharge Instructions: Secure with Kerlix as directed. Secured With: 30M Medipore H Soft Cloth Surgical T ape, 4 x 10 (in/yd) (Home Health) 1 x Per Day/30 Days Discharge Instructions: Secure with tape as directed. Wound #5 - Foot Wound Laterality: Left, Lateral Cleanser: Wound Cleanser (Home Health) 1 x Per Day/30 Days Discharge  Instructions: Cleanse the wound with wound cleanser prior to applying [name] clean dressing using gauze sponges, not tissue or cotton balls. Peri-Wound Care: Skin Prep (Home Health) 1 x Per Day/30 Days Discharge Instructions: Use skin prep as directed Prim Dressing: Grafix 1 x Per Day/30 Days ary Discharge Instructions: Secure with adaptic and steri strips Prim Dressing: Drawtex 1 x Per Day/30 Days ary Discharge Instructions: Apply drawtex over the adaptic. Patient will change everything from the drawtex to  the outer dressing only. Secondary Dressing: ABD Pad, 8x10 (Home Health) 1 x Per Day/30 Days Discharge Instructions: Apply over primary dressing as directed. Secured With: [employer], 4.5x3.1 (in/yd) (Home Health) 1 x Per Day/30 Days Discharge Instructions: Secure with Kerlix as directed. Secured With: 83M Medipore H Soft Cloth Surgical T ape, 4 x 10 (in/yd) (Home Health) 1 x Per Day/30 Days Discharge Instructions: Secure with tape as directed. [name] (ensure 1 physician order is in chart). [name]. Notify HBO physician and await physician orders. 2 If result is 70 mg/dl or below: B. If the result meets the hospital definition of [name] critical result, follow hospital policy. [name]. Give patient an 8 ounce Glucerna Shake, an 8 ounce Ensure, or 8 ounces of [name] Glucerna/Ensure equivalent dietary supplement*. B. Wait 30 minutes. If result is 71 mg/dl to 130 mg/dl: C. Retest patients capillary blood [name] (CBG). D. If result greater than or equal to 110 mg/dl, proceed with HBO. If result less than 110 mg/dl, notify HBO physician and consider holding HBO. If result is 131 mg/dl to 249 mg/dl: [name]. Proceed with HBO. [name]. Notify HBO physician and await physician orders. B. It is recommended to hold HBO and do If result is 250 mg/dl or greater: blood/urine ketone testing. C. If  the result meets the hospital definition of [name] critical result, follow hospital policy. POST-HBO [name] INTERVENTIONS ACTION INTERVENTION Obtain post HBO capillary blood [name] (ensure 1 physician order is in chart). [name]. Notify HBO physician and await physician orders. 2 If result is 70 mg/dl or below: B. If the result meets the hospital definition of [name] critical result, follow hospital policy. [name]. Give patient an 8 ounce Glucerna Shake, an 8 ounce Ensure, or 8 ounces of [name], [name] ([id number]) [id number] Page 7 of 13 Glucerna/Ensure equivalent dietary supplement*. B. Wait 15 minutes for symptoms of If result is 71 mg/dl to 100 mg/dl: hypoglycemia (i.e. nervousness, anxiety, sweating, chills, clamminess, irritability, confusion, tachycardia or dizziness). C. If patient asymptomatic, discharge patient. If patient symptomatic, repeat capillary blood [name] (CBG) and notify HBO physician. If result is 101 mg/dl to 249 mg/dl: [name]. Discharge patient. [name]. Notify HBO physician and await physician orders. B. It is recommended to do blood/urine ketone If result is 250 mg/dl or greater: testing. C. If the result meets the hospital definition of [name] critical result, follow hospital policy. *Juice or candies are NOT equivalent products. If patient refuses the Glucerna or Ensure, please consult the hospital dietitian for an appropriate substitute. Electronic Signature(s) Signed: 06/11/2022 2:30:41 PM By: [name] DO Entered By: [name] on 06/11/2022 11:53:26 -------------------------------------------------------------------------------- Problem List Details Patient Name: Date of Service: [name], [name]. 06/11/2022 10:15 [name] Medical Record Number: [id number] Patient Account Number: [id number] Date of Birth/Sex: Treating RN: 1955/10/13 (67 y.o. M) Primary Care Provider: Roselee [name] Other Clinician: Referring Provider: Treating  Provider/Extender: [name], [name] in Treatment: 8 Active Problems ICD-10 Encounter Code Description Active Date MDM Diagnosis [phone] Non-pressure chronic ulcer of other part of left foot with fat layer exposed 04/14/2022 No Yes M86.172 Other acute osteomyelitis, left ankle and foot 05/08/2022 No Yes L97.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 specified 04/14/2022 No Yes severity E11.621 Type 2 diabetes mellitus with foot ulcer 04/14/2022 No Yes E11.42 Type 2 diabetes mellitus with diabetic polyneuropathy 04/14/2022 No Yes Inactive Problems Resolved Problems Electronic Signature(s) Signed: 06/11/2022  2:30:41 PM By: [name] DO Entered By: [name] on 06/11/2022 11:51:00 Peri [name] [name] ([id number]) [id number] Page 8 of 13 -------------------------------------------------------------------------------- Progress Note Details Patient Name: Date of Service: [name], [name]. 06/11/2022 10:15 [name] Medical Record Number: [id number] Patient Account Number: [id number] Date of Birth/Sex: Treating RN: 1956-03-25 (67 y.o. M) Primary Care Provider: Roselee [name] Other Clinician: Referring Provider: Treating Provider/Extender: [name], [name] in Treatment: 8 Subjective Chief Complaint Information obtained from Patient 10/16/20; patient is here with [name] substantial wound on his right foot in the setting of [name] recent transmetatarsal amputation. 04/14/2022; referral from podiatry for postsurgical wound of nonhealing partial left fifth ray amputation History of Present Illness (HPI) ADMISSION 10/16/20 This is [name] 67 year old man who is [name] type II diabetic. Initially seen by Dr. Unk Lightning of vein and vascular on 09/10/2021 for bilateral lower extremity rest pain right greater than left. His ABIs demonstrated monophasic waveforms at the ankles bilaterally with depressed toe pressures. His  ABI in the right was 0.5 and on the left 0.28. There were no obtainable waveforms for TBI's. He underwent angiography on 09/10/2021 and had findings of severe PAD with 95% stenosis of the distal SFA. He also had peroneal and posterior tibial arteries occluded with significant collateralization in the calf there was reconstitution of the posterior tibial artery in the distal third of the calf. Ostia of the anterior tibial artery was not appreciated. He underwent [name] angioplasty of the superficial femoral artery. He required admission to hospital from 09/12/2021 through 09/22/2021 with right foot cellulitis and sepsis. On 3/13 he underwent [name] right below-knee popliteal to posterior tibial bypass using [name] greater saphenous vein. Ultimately however he required [name] right TMA by podiatry which I believe was done on 3/17 for progressive diabetic foot infection. He was discharged to [facility] skilled facility. He arrives in clinic today with [name] large wound area over the entirety of his amputation site extending proximally. The attempted flap closure of the amputation site and his TMA has failed and the wound extends under the attempt to closure. Still some sutures in place. There is an odor but he is not systemically unwell. He is due for follow-up arterial studies and has an appointment with vascular surgery on 10/28/2021. They are using wet-to-dry dressings at [facility]. Past medical history includes type 2 diabetes with peripheral neuropathy, hypertension, obstructive sleep apnea 4/20; patient presents for follow-up. He is being discharged from his facility at [facility] today. They have been doing Dakin's wet-to-dry dressings. He states that his fiance can help with dressing changes. He is getting [facility] home [facility] to come out 3 times [name] week once he leaves the facility. He is scheduled to see vein and vascular on 5/12. He has not seen the wound himself. He puts covers over his face for wound exams. He currently  denies systemic signs of infection. 4/28; patient admitted to the clinic 2 [name] ago with [name] dehisced right TMA in the setting of type 2 diabetes with significant PAD. He is status post revascularization. He has been discharged from the nursing home he was at and now is at home using Dakin's wet-to-dry dressings daily he has home health. He denies any systemic signs of infection 5/4; patient presents for follow-up. His fiance and home health have been changing his dressings. He currently denies systemic signs of infection. He continues to put sheet covers over his face during our wound encounters because he does not want [name] look at the wound. 5/25;  Patient presents for follow-up. He missed his last clinic appointment. He had [name] bone biopsy and culture done at last clinic visit that showed acute osteomyelitis with growth of E. coli, stenotrophomonas maltophilia and enteroccocus faecalis. [name] referral to infectious disease was made. He has not heard from them yet. He has been using Dakin's wet-to-dry dressings. He is now more comfortable with looking at the wound bed. 6/6; the patient comes into clinic today with 2 wounds on the left foot which apparently have been there for several months. This was not known to assess but was known by Dr. Donzetta Matters. In fact he underwent an angiogram yesterday. He had [name] laser atherectomy of the left posterior tibial artery with [name] 3 mm balloon angioplasty, also [name] stent of the last left SFA. 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 This is [name] lot better than the last time I saw it. He has been using Dakin's wet-to-dry. He has an appointment with infectious disease for [name] bone biopsy which showed E. coli, stenotrophomonas and Enterococcus faecalis. The appointment is for tomorrow 04/14/2022 Mr. Gamal Todisco ajuddin is an  uncontrolled type 2 diabetic on insulin that presents with [name] left foot ulcer. On 12/18/2021 he required [name] left fifth ray amputation. He had revision of this site on 6/17 by Dr. Posey Pronto, podiatry. He states he has had [name] wound VAC on this area for the past 3 [name]. It is unclear what the prior wound care has been. He has [name] history of osteomyelitis to this area and was treated with IV and oral antibiotics For 6 [name] by Dr. Linus Salmons of infectious disease. At his last follow-up on 02/20/2022 his CRP was trending down and antibiotics were stopped. He also has [name] history of peripheral arterial disease status post stenting to the left SFA and balloon angioplasty to the left posterior tibial artery On 12/2021. He has follow-up with vein and vascular at the end of the month. He also has [name] wound to the dorsal aspect of the left foot that he has been placing Betadine on. 10/19; patient presents for follow-up. He had [name] bone biopsy of the left foot at last clinic visit showed osteomyelitis. The bone culture showed Enterobacter cloacae, Enterococcus faecalis, Staphylococcus aureus, viridans group strep, actinotigum schaelii. He has been referred to infectious disease And has an appointment on October 25. He has been using Dakin's wet-to-dry dressing to the lateral left foot wound and Hydrofera Blue and Medihoney to the dorsal foot wound. He has no issues or complaints today. He denies systemic signs of infection. 10/26; patient presents for follow-up. He saw Dr. Linus Salmons yesterday with infectious disease and has been prescribed doxycycline and Levaquin for his chronic osteomyelitis. He has been using Dakin's wet-to-dry dressings to the lateral left foot wound and Medihoney and Hydrofera Blue to the dorsal wound. We discussed [name] skin graft for the dorsal foot wound and he would like to proceed with insurance verification. 11/3; patient with [name] wound on the dorsal left foot and [name] substantial postsurgical area on the lateral left  foot fifth ray amputation. He is also followed by Dr. Alton Revere of infectious disease. He had [name] bone biopsy from the left lateral foot that showed acute osteomyelitis. Swab culture showed multiple organisms. He has also been revascularized by infectious disease status post left SFA stenting  and tibial laser atherectomy and angioplasty. An MRI of the left foot had previously shown osteomyelitis of the fifth met head we have been using Dakin's wet-to-dry to the left lateral foot and Medihoney and Hydrofera Blue to the KHANH, TANORI [name] ([id number]) [id number] Page 9 of 13 wound on the dorsal foot. 11/13; HBO treatment was discussed at last clinic visit and patient would like to proceed with this. He has orientation today. He has been approved for Grafix. We have been using Hydrofera Blue and Medihoney to the left dorsal foot and collagen to the left lateral foot. Patient has home health that comes in for dressing changes. He currently has no issues or complaints today. 12/7; patient missed his last clinic appointment. He was last seen 3 [name] ago. He is currently off of antibiotics per ID. He completed [name] prolonged treatment. He has been using collagen to the lateral foot wound. He has been using Medihoney and Hydrofera Blue to the dorsal foot wound. He has been doing well with hyperbaric oxygen therapy. He has no issues or complaints today. He denies signs of of infection. Patient History Information obtained from Patient, Chart. Family History Diabetes - Mother,Siblings, Heart Disease - Siblings, Hypertension - Siblings, Stroke - Mother, No family history of Cancer, Hereditary Spherocytosis, Kidney Disease, Lung Disease, Seizures, Thyroid Problems, Tuberculosis. Social History Former smoker, Marital Status - Single, Alcohol Use - Never, Drug Use - No History, Caffeine Use - Daily. Medical History Eyes Denies history of Cataracts, Glaucoma, Optic  Neuritis Respiratory Patient has history of Asthma, Sleep Apnea Denies history of Aspiration, Chronic Obstructive Pulmonary Disease (COPD), Pneumothorax, Tuberculosis Cardiovascular Patient has history of Hypertension, Peripheral Venous Disease Denies history of Angina, Arrhythmia, Congestive Heart Failure, Coronary Artery Disease, Deep Vein Thrombosis, Hypotension, Myocardial Infarction, Peripheral Arterial Disease, Phlebitis, Vasculitis Endocrine Patient has history of Type II Diabetes Denies history of Type I Diabetes Integumentary (Skin) Denies history of History of Burn Musculoskeletal Patient has history of Osteomyelitis Denies history of Gout, Rheumatoid Arthritis, Osteoarthritis Neurologic Patient has history of Neuropathy Denies history of Dementia, Quadriplegia, Paraplegia, Seizure Disorder Hospitalization/Surgery History - 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 Posey Pronto. - aortogram 12/08/2021 Dr. Virl Cagey [facility]. Medical [name] Surgical History Notes nd Psychiatric PTSD Objective Constitutional respirations regular, non-labored and within target range for patient.. Vitals Time Taken: 10:46 AM, Height: 72 in, Weight: 220 lbs, BMI: 29.8, Temperature: 97.6 F, Pulse: 89 bpm, Respiratory Rate: 18 breaths/min, Blood Pressure: 128/75 mmHg, Capillary Blood [name]: 108 mg/dl. Cardiovascular 2+ dorsalis pedis/posterior tibialis pulses. Psychiatric pleasant and cooperative. General Notes: Left foot: T the lateral aspect there is an open wound with granulation tissue and nonviable tissue. No probing to bone . T the dorsal aspect o o there is an open wound with granulation tissue present. No signs of surrounding soft tissue infection. Integumentary (Hair, Skin) Wound #4 status is Open. Original cause of wound was Gradually Appeared. The date acquired was: 09/03/2021. The wound has been in treatment 8 [name]. The wound is located on the Left,Dorsal Foot. The wound measures  1.3cm length x 0.7cm width x 0.2cm depth; 0.715cm^2 area and 0.143cm^3 volume. There is Fat Layer (Subcutaneous Tissue) exposed. There is no tunneling or undermining noted. There is [name] medium amount of serosanguineous drainage noted. The wound margin is distinct with the outline attached to the wound base. There is large (67-100%) pink, pale granulation within the wound bed. There is no necrotic tissue within the wound bed. The periwound skin appearance had no abnormalities  noted for moisture. The periwound skin appearance had no abnormalities noted for color. The periwound skin appearance did not exhibit: Callus, Crepitus, Excoriation, Induration, Rash, Scarring. Periwound temperature was noted as No Abnormality. Wound #5 status is Open. Original cause of wound was Surgical Injury. The date acquired was: 12/20/2021. The wound has been in treatment 8 [name]. The wound is located on the Left,Lateral Foot. The wound measures 5.8cm length x 1.4cm width x 0.4cm depth; 6.377cm^2 area and 2.551cm^3 volume. There is bone and Fat Layer (Subcutaneous Tissue) exposed. There is no tunneling or undermining noted. There is [name] medium amount of serosanguineous drainage noted. The wound margin is epibole. There is large (67-100%) red, pink granulation within the wound bed. There is [name] small (1-33%) amount of necrotic tissue [name], [name] ([id number]) [id number] Page 10 of 13 within the wound bed including Adherent Slough. The periwound skin appearance had no abnormalities noted for color. The periwound skin appearance exhibited: Callus. The periwound skin appearance did not exhibit: Crepitus, Excoriation, Induration, Rash, Scarring, Dry/Scaly, Maceration. Periwound temperature was noted as No Abnormality. Assessment Active Problems ICD-10 Non-pressure chronic ulcer of other part of left foot with fat layer exposed Other acute osteomyelitis, left ankle and foot Non-pressure chronic  ulcer of other part of left foot with other specified severity Type 2 diabetes mellitus with foot ulcer Type 2 diabetes mellitus with diabetic polyneuropathy Patient's wounds have shown improvement in size and appearance since last clinic visit. I debrided nonviable tissue. Grafix was available and this was placed in standard fashion today. Instructions were given on how to change the outer dressing. Continue HBO therapy. Follow up in one week. Procedures Wound #5 Pre-procedure diagnosis of Wound #5 is [name] Diabetic Wound/Ulcer of the Lower Extremity located on the Left,Lateral Foot .Severity of Tissue Pre Debridement is: Fat layer exposed. There was [name] Excisional Skin/Subcutaneous Tissue Debridement with [name] total area of 8.12 sq cm performed by [name], DO. With the following instrument(s): Curette to remove Viable and Non-Viable tissue/material. Material removed includes Callus, Subcutaneous Tissue, and Slough after achieving pain control using Lidocaine. No specimens were taken. [name] time out was conducted at 11:15, prior to the start of the procedure. [name] Minimum amount of bleeding was controlled with Pressure. The procedure was tolerated well with [name] pain level of 0 throughout and [name] pain level of 0 following the procedure. Post Debridement Measurements: 5.8cm length x 1.4cm width x 0.4cm depth; 2.551cm^3 volume. Character of Wound/Ulcer Post Debridement is improved. Severity of Tissue Post Debridement is: Fat layer exposed. Post procedure Diagnosis Wound #5: Same as Pre-Procedure Pre-procedure diagnosis of Wound #5 is [name] Diabetic Wound/Ulcer of the Lower Extremity located on the Left,Lateral Foot. [name] skin graft procedure using [name] bioengineered skin substitute/cellular or tissue based product was performed by [name], DO with the following instrument(s): Forceps and Scissors. Grafix prime was applied and secured with Steri-Strips. 4.5 sq cm of product was utilized and 0 sq cm was wasted.  Post Application, adaptic was applied. [name] Time Out was conducted at 11:20, prior to the start of the procedure. The procedure was tolerated well with [name] pain level of 0 throughout and [name] pain level of 0 following the procedure. Post procedure Diagnosis Wound #5: Same as Pre-Procedure . Wound #4 Pre-procedure diagnosis of Wound #4 is [name] Diabetic Wound/Ulcer of the Lower Extremity located on the Left,Dorsal Foot. [name] skin graft procedure using [name] bioengineered skin substitute/cellular or tissue based product was performed by [name], DO with the following  instrument(s): Forceps and Scissors. Grafix prime was applied and secured with Steri-Strips. 1.5 sq cm of product was utilized and 4.5 sq cm was wasted due to used for other wound. Post Application, adaptic was applied. [name] Time Out was conducted at 11:20, prior to the start of the procedure. The procedure was tolerated well with [name] pain level of 0 throughout and [name] pain level of 0 following the procedure. Post procedure Diagnosis Wound #4: Same as Pre-Procedure . Plan Follow-up Appointments: Return Appointment in 1 week. - w/ Dr. Heber [place] on Friday (pt. gets out of HBO at 10:00 so any appt. time around that if possible) Return Appointment in 2 [name]. - w/ Dr. Heber [place] on Thursday (pt. gets out of HBO at 10:00 so any appt. time around that if possible) (have to schedule ahead d/t Grafix (skin sub) placement) Return appointment in 3 [name]. - w/ Dr. Heber [place] on Tuesday (pt. gets out of HBO at 10:00 so any appt. time around that if possible) (have to schedule ahead d/t Grafix (skin sub) placement) Anesthetic: (In clinic) Topical Lidocaine 5% applied to wound bed Cellular or Tissue Based Products: Cellular or Tissue Based Product Type: - Run IVR for Grafix for left dorsal foot- pending Grafix # 1 applied 06/11/22 Bathing/ Shower/ Hygiene: May shower with protection but do not get wound dressing(s) wet. Edema Control - Lymphedema / SCD /  Other: Elevate legs to the level of the heart or above for 30 minutes daily and/or when sitting, [name] frequency of: - 3-4 times [name] day throughout the day. Avoid standing for long periods of time. Off-Loading: Open toe surgical shoe to: - wear when standing or walking. Home Health: New wound care orders this week; continue Home Health for wound care. May utilize formulary equivalent dressing for wound treatment orders unless otherwise specified. - Continue medihoney and hydrofera blue to dorsal wound. Prisma with saline moisten gauzes backing. Dressing changes to be completed by [place] on Monday / Wednesday / Friday except when patient has scheduled visit at [facility]. Other Home Health Orders/Instructions: - [place] home health. Hyperbaric Oxygen Therapy: Evaluate for HBO Therapy Indication: - Wagner Grade III to left lateral foot ENDER, RORKE [name] ([id number]) [id number] Page 11 of 13 2.5 ATA for 90 Minutes with 2 Five (5) Minute Air Breaks T Number of Treatments: - 40 otal One treatments per day (delivered Monday through Friday unless otherwise specified in Special Instructions below): Finger stick Blood [name] Pre- and Post- HBOT Treatment. Follow Hyperbaric Oxygen [name] Protocol Afrin (Oxymetazoline HCL) 0.05% nasal spray - 1 spray in both nostrils daily as needed prior to HBO treatment for difficulty clearing ears WOUND #4: - Foot Wound Laterality: Dorsal, Left Cleanser: Wound Cleanser (Home Health) 1 x Per Day/30 Days Discharge Instructions: Cleanse the wound with wound cleanser prior to applying [name] clean dressing using gauze sponges, not tissue or cotton balls. Peri-Wound Care: Skin Prep (Home Health) 1 x Per Day/30 Days Discharge Instructions: Use skin prep as directed Prim Dressing: Grafix 1 x Per Day/30 Days ary Discharge Instructions: Secure with adaptic and steri strips Secondary Dressing: ABD Pad, 8x10 (Home Health) 1 x Per Day/30  Days Discharge Instructions: Apply over primary dressing as directed. Secured With: [employer], 4.5x3.1 (in/yd) (Home Health) 1 x Per Day/30 Days Discharge Instructions: Secure with Kerlix as directed. Secured With: 57M Medipore H Soft Cloth Surgical T ape, 4 x 10 (in/yd) (Home Health) 1 x Per Day/30 Days Discharge Instructions: Secure with tape as directed. WOUND #  5: - Foot Wound Laterality: Left, Lateral Cleanser: Wound Cleanser (Home Health) 1 x Per Day/30 Days Discharge Instructions: Cleanse the wound with wound cleanser prior to applying [name] clean dressing using gauze sponges, not tissue or cotton balls. Peri-Wound Care: Skin Prep (Home Health) 1 x Per Day/30 Days Discharge Instructions: Use skin prep as directed Prim Dressing: Grafix 1 x Per Day/30 Days ary Discharge Instructions: Secure with adaptic and steri strips Prim Dressing: Drawtex 1 x Per Day/30 Days ary Discharge Instructions: Apply drawtex over the adaptic. Patient will change everything from the drawtex to the outer dressing only. Secondary Dressing: ABD Pad, 8x10 (Home Health) 1 x Per Day/30 Days Discharge Instructions: Apply over primary dressing as directed. Secured With: [employer], 4.5x3.1 (in/yd) (Home Health) 1 x Per Day/30 Days Discharge Instructions: Secure with Kerlix as directed. Secured With: 4M Medipore H Soft Cloth Surgical T ape, 4 x 10 (in/yd) (Home Health) 1 x Per Day/30 Days Discharge Instructions: Secure with tape as directed. 1. In office sharp debridement 2. Grafix placed in standard fashion 3. Continue aggressive offloadingoosurgical shoe 4. Follow-up in 1 week 5. Continue HBO therapy Electronic Signature(s) Signed: 06/11/2022 2:30:41 PM By: [name] DO Entered By: [name] on 06/11/2022 11:58:41 -------------------------------------------------------------------------------- HxROS Details Patient Name: Date of Service: [name], [name]. 06/11/2022 10:15 [name]  M Medical Record Number: [id number] Patient Account Number: [id number] Date of Birth/Sex: Treating RN: 1956-02-23 (67 y.o. M) Primary Care Provider: Roselee [name] Other Clinician: Referring Provider: Treating Provider/Extender: [name] in Treatment: 8 Information Obtained From Patient Chart Eyes Medical History: Negative for: Cataracts; Glaucoma; Optic Neuritis Respiratory Medical History: Positive for: Asthma; Sleep Apnea Negative for: Aspiration; Chronic Obstructive Pulmonary Disease (COPD); Pneumothorax; Tuberculosis Cardiovascular Medical History: [name], RESSLER ([id number]) [id number] Page 12 of 13 Positive for: Hypertension; Peripheral Venous Disease Negative for: Angina; Arrhythmia; Congestive Heart Failure; Coronary Artery Disease; Deep Vein Thrombosis; Hypotension; Myocardial Infarction; Peripheral Arterial Disease; Phlebitis; Vasculitis Endocrine Medical History: Positive for: Type II Diabetes Negative for: Type I Diabetes Time with diabetes: 20 years Treated with: Insulin, Oral agents Blood sugar tested every day: Yes Tested : 4x day Integumentary (Skin) Medical History: Negative for: History of Burn Musculoskeletal Medical History: Positive for: Osteomyelitis Negative for: Gout; Rheumatoid Arthritis; Osteoarthritis Neurologic Medical History: Positive for: Neuropathy Negative for: Dementia; Quadriplegia; Paraplegia; Seizure Disorder Psychiatric Medical History: Past Medical History Notes: PTSD Immunizations Pneumococcal Vaccine: Received Pneumococcal Vaccination: No Implantable Devices None Hospitalization / Surgery History Type of Hospitalization/Surgery 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 Posey Pronto aortogram 12/08/2021 Dr. Virl Cagey [facility] Family and Social History Cancer: No; Diabetes: Yes - Mother,Siblings; Heart Disease: Yes - Siblings; Hereditary Spherocytosis: No;  Hypertension: Yes - Siblings; Kidney Disease: No; Lung Disease: No; Seizures: No; Stroke: Yes - Mother; Thyroid Problems: No; Tuberculosis: No; Former smoker; Marital Status - Single; Alcohol Use: Never; Drug Use: No History; Caffeine Use: Daily; Financial Concerns: No; Food, Clothing or Shelter Needs: No; Support System Lacking: No; Transportation Concerns: No Electronic Signature(s) Signed: 06/11/2022 2:30:41 PM By: [name] DO Entered By: [name] on 06/11/2022 11:52:39 -------------------------------------------------------------------------------- SuperBill Details Patient Name: Date of Service: [name], Gaylord [name]. 06/11/2022 Medical Record Number: [id number] Patient Account Number: [id number] Date of Birth/Sex: Treating RN: Dec 16, 1955 (67 y.o. 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 Facility Procedures : CPT4 Code: [id number] Description: C1660- [facility] PL 2X3 sq cm Modifier: Quantity: 6 : CPT4 Code: [id number] Description: 93235 - SKIN SUB GRAFT FACE/NK/HF/G ICD-10 Diagnosis Description L97.522 Non-pressure chronic ulcer of other part of left foot with fat layer exposed Modifier: Quantity: 1 Physician Procedures : CPT4 Code Description Modifier [id number] - WC PHYS SKIN SUB GRAFT FACE/NK/HF/G ICD-10 Diagnosis Description L97.522 Non-pressure chronic ulcer of other part of left foot with fat layer exposed Quantity:  1 Electronic Signature(s) Signed: 06/11/2022 2:30:41 PM By: [name] DO Entered By: [name] on 06/11/2022 12:00:11

## 2022-07-10 ENCOUNTER — Encounter (HOSPITAL_BASED_OUTPATIENT_CLINIC_OR_DEPARTMENT_OTHER): Payer: Medicare HMO | Admitting: Internal Medicine

## 2022-07-10 DIAGNOSIS — L97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 specified severity: Secondary | ICD-10-CM

## 2022-07-10 DIAGNOSIS — M86172 Other acute osteomyelitis, left ankle and foot: Secondary | ICD-10-CM

## 2022-07-10 DIAGNOSIS — L97522 Non-pressure chronic ulcer of other part of left foot with fat layer exposed: Secondary | ICD-10-CM | POA: Diagnosis not present

## 2022-07-10 DIAGNOSIS — E11621 Type 2 diabetes mellitus with foot ulcer: Secondary | ICD-10-CM

## 2022-07-10 LAB — GLUCOSE, CAPILLARY: Glucose-Capillary: 294 mg/dL — ABNORMAL HIGH (ref 70–99)

## 2022-07-10 NOTE — Progress Notes (Signed)
[name], MERRIOTT [name] ([id number]) [id number] Page 1 of 14 Visit Report for 07/10/2022 Chief Complaint Document Details Patient Name: Date of Service: [name], [name]. 07/10/2022 8:45 [name] Medical Record Number: [id number] Patient Account Number: [id number] Date of Birth/Sex: Treating RN: 04-17-1956 (67 y.o. M) Primary Care Provider: [name] Other Clinician: Referring Provider: Treating Provider/Extender: [name], [name] in Treatment: 12 Information Obtained from: Patient Chief Complaint 10/16/20; patient is here with [name] substantial wound on his right foot in the setting of [name] recent transmetatarsal amputation. 04/14/2022; referral from podiatry for postsurgical wound of nonhealing partial left fifth ray amputation Electronic Signature(s) Signed: 07/10/2022 11:39:44 AM By: [name] DO Entered By: [name] on 07/10/2022 09:29:34 -------------------------------------------------------------------------------- Cellular or Tissue Based Product Details Patient Name: Date of Service: [name], [name]. 07/10/2022 8:45 [name] Medical Record Number: [id number] Patient Account Number: [id number] Date of Birth/Sex: Treating RN: 1956/02/28 (67 y.o. [name] Pop, [name] Primary Care Provider: [name] Other Clinician: Referring Provider: Treating Provider/Extender: [name] in Treatment: 12 Cellular or Tissue Based Product Type Wound #4 Left,Dorsal Foot Applied to: Performed By: Physician [name], DO Cellular or Tissue Based Product Type: Grafix prime Level of Consciousness (Pre-procedure): Awake and Alert Pre-procedure Verification/Time Out Yes - 09:05 Taken: Location: genitalia / hands / feet / multiple digits Wound Size (sq cm): 0.2 Product Size (sq cm): 6 Waste Size (sq cm): 5 Waste Reason: wound size; applied to other wound Amount of Product Applied (sq cm): 1 Instrument Used:  Forceps, Scissors Lot #: [id number] Expiration Date: 11/18/2023 Fenestrated: No Reconstituted: Yes Solution Type: NS Solution Amount: 3ML Lot #: [id number] Solution Expiration Date: 12/03/2024 Secured: Yes Secured With: Steri-Strips Dressing Applied: Yes Primary Dressing: ADAPTIC Procedural Pain: 0 Post Procedural Pain: 0 Response to Treatment: Procedure was tolerated well [name], HECKLER [name] ([id number]) [phone].pdf Page 2 of 14 Level of Consciousness (Post- Awake and Alert procedure): Post Procedure Diagnosis Same as Pre-procedure Electronic Signature(s) Signed: 07/10/2022 11:39:44 AM By: [name] DO Signed: 07/10/2022 12:14:03 PM By: Rhae Hammock RN Entered By: Rhae Hammock on 07/10/2022 09:02:39 -------------------------------------------------------------------------------- Cellular or Tissue Based Product Details Patient Name: Date of Service: [name], [name]. 07/10/2022 8:45 [name] Medical Record Number: [id number] Patient Account Number: [id number] Date of Birth/Sex: Treating RN: 03-Dec-1955 (67 y.o. [name] Pop, [name] Primary Care Provider: [name] Other Clinician: Referring Provider: Treating Provider/Extender: [name] in Treatment: 12 Cellular or Tissue Based Product Type Wound #5 Left,Lateral Foot Applied to: Performed By: Physician [name], DO Cellular or Tissue Based Product Type: Grafix prime Level of Consciousness (Pre-procedure): Awake and Alert Pre-procedure Verification/Time Out Yes - 09:05 Taken: Location: genitalia / hands / feet / multiple digits Wound Size (sq cm): 3.64 Product Size (sq cm): 5 Waste Size (sq cm): 0 Amount of Product Applied (sq cm): 5 Instrument Used: Forceps, Scissors Lot #: [id number] Expiration Date: 11/18/2023 Fenestrated: No Reconstituted: Yes Solution Type: NS Solution Amount: 3ML Lot #: [id number] Solution Expiration Date:  12/03/2024 Secured: Yes Secured With: Steri-Strips Dressing Applied: Yes Primary Dressing: ADAPTIC Procedural Pain: 0 Post Procedural Pain: 0 Response to Treatment: Procedure was tolerated well Level of Consciousness (Post- Awake and Alert procedure): Post Procedure Diagnosis Same as Pre-procedure Electronic Signature(s) Signed: 07/10/2022 11:39:44 AM By: [name] DO Signed: 07/10/2022 12:14:03 PM By: Rhae Hammock RN Entered By: Rhae Hammock on 07/10/2022 09:04:21 [name] ([id number]) [id number] Page 3 of 14 -------------------------------------------------------------------------------- Debridement Details  Patient Name: Date of Service: [name], [name]. 07/10/2022 8:45 [name] Medical Record Number: [id number] Patient Account Number: [id number] Date of Birth/Sex: Treating RN: 1955/12/15 (67 y.o. [name] Pop, [name] Primary Care Provider: [name] Other Clinician: Referring Provider: Treating Provider/Extender: [name] in Treatment: 12 Debridement Performed for Assessment: Wound #4 Left,Dorsal Foot Performed By: Physician [name], DO Debridement Type: Debridement Severity of Tissue Pre Debridement: Fat layer exposed Level of Consciousness (Pre-procedure): Awake and Alert Pre-procedure Verification/Time Out Yes - 09:00 Taken: Start Time: 09:00 Pain Control: Lidocaine T Area Debrided (L x W): otal 0.5 (cm) x 0.4 (cm) = 0.2 (cm) Tissue and other material debrided: Viable, Non-Viable, Slough, Subcutaneous, Slough Level: Skin/Subcutaneous Tissue Debridement Description: Excisional Instrument: Curette Bleeding: Minimum Hemostasis Achieved: Pressure End Time: 09:00 Procedural Pain: 0 Post Procedural Pain: 0 Response to Treatment: Procedure was tolerated well Level of Consciousness (Post- Awake and Alert procedure): Post Debridement Measurements of Total Wound Length: (cm)  0.5 Width: (cm) 0.4 Depth: (cm) 0.1 Volume: (cm) 0.016 Character of Wound/Ulcer Post Debridement: Improved Severity of Tissue Post Debridement: Fat layer exposed Post Procedure Diagnosis Same as Pre-procedure Electronic Signature(s) Signed: 07/10/2022 11:39:44 AM By: [name] DO Signed: 07/10/2022 12:14:03 PM By: Rhae Hammock RN Entered By: Rhae Hammock on 07/10/2022 08:59:53 -------------------------------------------------------------------------------- Debridement Details Patient Name: Date of Service: [name], [name]. 07/10/2022 8:45 [name] Medical Record Number: [id number] Patient Account Number: [id number] Date of Birth/Sex: Treating RN: 12/18/1955 (67 y.o. [name] Pop, [name] Primary Care Provider: [name] Other Clinician: Referring Provider: Treating Provider/Extender: [name] in Treatment: 12 Debridement Performed for Assessment: Wound #5 Left,Lateral Foot Performed By: Physician [name], DO Debridement Type: Debridement Severity of Tissue Pre Debridement: Fat layer exposed Level of Consciousness (Pre-procedure): Awake and Alert Pre-procedure Verification/Time Out Yes - 09:00 Taken: Start Time: 09:00 Pain Control: Lidocaine T Area Debrided (L x W): otal 5.2 (cm) x 0.7 (cm) = 3.64 (cm) Tissue and other material debrided: Viable, Non-Viable, Slough, Subcutaneous, Slough Level: Skin/Subcutaneous Tissue [name], [name] ([id number]) [id number] Page 4 of 14 Debridement Description: Excisional Instrument: Curette Bleeding: Minimum Hemostasis Achieved: Pressure End Time: 09:00 Procedural Pain: 0 Post Procedural Pain: 0 Response to Treatment: Procedure was tolerated well Level of Consciousness (Post- Awake and Alert procedure): Post Debridement Measurements of Total Wound Length: (cm) 5.2 Width: (cm) 0.7 Depth: (cm) 0.4 Volume: (cm) 1.144 Character of Wound/Ulcer Post  Debridement: Improved Severity of Tissue Post Debridement: Fat layer exposed Post Procedure Diagnosis Same as Pre-procedure Electronic Signature(s) Signed: 07/10/2022 11:39:44 AM By: [name] DO Signed: 07/10/2022 12:14:03 PM By: Rhae Hammock RN Entered By: Rhae Hammock on 07/10/2022 09:00:48 -------------------------------------------------------------------------------- HPI Details Patient Name: Date of Service: [name], [name]. 07/10/2022 8:45 [name] Medical Record Number: [id number] Patient Account Number: [id number] Date of Birth/Sex: Treating RN: 05/25/1956 (67 y.o. M) Primary Care Provider: [name] Other Clinician: Referring Provider: Treating Provider/Extender: [name], [name] in Treatment: 12 History of Present Illness HPI Description: ADMISSION 10/16/20 This is [name] 67 year old man who is [name] type II diabetic. Initially seen by Dr. Unk Lightning of vein and vascular on 09/10/2021 for bilateral lower extremity rest pain right greater than left. His ABIs demonstrated monophasic waveforms at the ankles bilaterally with depressed toe pressures. His ABI in the right was 0.5 and on the left 0.28. There were no obtainable waveforms for TBI's. He underwent angiography on 09/10/2021 and had findings of severe PAD with  95% stenosis of the distal SFA. He also had peroneal and posterior tibial arteries occluded with significant collateralization in the calf there was reconstitution of the posterior tibial artery in the distal third of the calf. Ostia of the anterior tibial artery was not appreciated. He underwent [name] angioplasty of the superficial femoral artery. He required admission to hospital from 09/12/2021 through 09/22/2021 with right foot cellulitis and sepsis. On 3/13 he underwent [name] right below-knee popliteal to posterior tibial bypass using [name] greater saphenous vein. Ultimately however he required [name] right TMA by podiatry which I believe was done on 3/17  for progressive diabetic foot infection. He was discharged to [facility] skilled facility. He arrives in clinic today with [name] large wound area over the entirety of his amputation site extending proximally. The attempted flap closure of the amputation site and his TMA has failed and the wound extends under the attempt to closure. Still some sutures in place. There is an odor but he is not systemically unwell. He is due for follow-up arterial studies and has an appointment with vascular surgery on 10/28/2021. They are using wet-to-dry dressings at [facility]. Past medical history includes type 2 diabetes with peripheral neuropathy, hypertension, obstructive sleep apnea 4/20; patient presents for follow-up. He is being discharged from his facility at [facility] today. They have been doing Dakin's wet-to-dry dressings. He states that his fiance can help with dressing changes. He is getting [facility] home [facility] to come out 3 times [name] week once he leaves the facility. He is scheduled to see vein and vascular on 5/12. He has not seen the wound himself. He puts covers over his face for wound exams. He currently denies systemic signs of infection. 4/28; patient admitted to the clinic 2 weeks ago with [name] dehisced right TMA in the setting of type 2 diabetes with significant PAD. He is status post revascularization. He has been discharged from the nursing home he was at and now is at home using Dakin's wet-to-dry dressings daily he has home health. He denies any systemic signs of infection 5/4; patient presents for follow-up. His fiance and home health have been changing his dressings. He currently denies systemic signs of infection. He continues to put sheet covers over his face during our wound encounters because he does not want [name] look at the wound. 5/25; Patient presents for follow-up. He missed his last clinic appointment. He had [name] bone biopsy and culture done at last clinic visit that showed acute osteomyelitis  with growth of E. coli, stenotrophomonas maltophilia and enteroccocus faecalis. [name] referral to infectious disease was made. He has not heard from them yet. He has been using Dakin's wet-to-dry dressings. He is now more comfortable with looking at the wound bed. CLOYCE, BLANKENHORN [name] ([id number]) [id number] Page 5 of 14 6/6; the patient comes into clinic today with 2 wounds on the left foot which apparently have been there for several months. This was not known to assess but was known by Dr. Donzetta Matters. In fact he underwent an angiogram yesterday. He had [name] laser atherectomy of the left posterior tibial artery with [name] 3 mm balloon angioplasty, also [name] stent of the last left SFA. 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 This is [name] lot better than the last time I saw it. He has been using Dakin's wet-to-dry. He  has an appointment with infectious disease for [name] bone biopsy which showed E. coli, stenotrophomonas and Enterococcus faecalis. The appointment is for tomorrow 04/14/2022 [name] is an uncontrolled type 2 diabetic on insulin that presents with [name] left foot ulcer. On 12/18/2021 he required [name] left fifth ray amputation. He had revision of this site on 6/17 by Dr. Posey Pronto, podiatry. He states he has had [name] wound VAC on this area for the past 3 weeks. It is unclear what the prior wound care has been. He has [name] history of osteomyelitis to this area and was treated with IV and oral antibiotics For 6 weeks by Dr. Linus Salmons of infectious disease. At his last follow-up on 02/20/2022 his CRP was trending down and antibiotics were stopped. He also has [name] history of peripheral arterial disease status post stenting to the left SFA and balloon angioplasty to the left posterior tibial artery On 12/2021. He has follow-up with vein and vascular  at the end of the month. He also has [name] wound to the dorsal aspect of the left foot that he has been placing Betadine on. 10/19; patient presents for follow-up. He had [name] bone biopsy of the left foot at last clinic visit showed osteomyelitis. The bone culture showed Enterobacter cloacae, Enterococcus faecalis, Staphylococcus aureus, viridans group strep, actinotigum schaelii. He has been referred to infectious disease And has an appointment on October 25. He has been using Dakin's wet-to-dry dressing to the lateral left foot wound and Hydrofera Blue and Medihoney to the dorsal foot wound. He has no issues or complaints today. He denies systemic signs of infection. 10/26; patient presents for follow-up. He saw Dr. Linus Salmons yesterday with infectious disease and has been prescribed doxycycline and Levaquin for his chronic osteomyelitis. He has been using Dakin's wet-to-dry dressings to the lateral left foot wound and Medihoney and Hydrofera Blue to the dorsal wound. We discussed [name] skin graft for the dorsal foot wound and he would like to proceed with insurance verification. 11/3; patient with [name] wound on the dorsal left foot and [name] substantial postsurgical area on the lateral left foot fifth ray amputation. He is also followed by Dr. Alton Revere of infectious disease. He had [name] bone biopsy from the left lateral foot that showed acute osteomyelitis. Swab culture showed multiple organisms. He has also been revascularized by infectious disease status post left SFA stenting and tibial laser atherectomy and angioplasty. An MRI of the left foot had previously shown osteomyelitis of the fifth met head we have been using Dakin's wet-to-dry to the left lateral foot and Medihoney and Hydrofera Blue to the wound on the dorsal foot. 11/13; HBO treatment was discussed at last clinic visit and patient would like to proceed with this. He has orientation today. He has been approved for Grafix. We have been using Hydrofera Blue and  Medihoney to the left dorsal foot and collagen to the left lateral foot. Patient has home health that comes in for dressing changes. He currently has no issues or complaints today. 12/7; patient missed his last clinic appointment. He was last seen 3 weeks ago. He is currently off of antibiotics per ID. He completed [name] prolonged treatment. He has been using collagen to the lateral foot wound. He has been using Medihoney and Hydrofera Blue to the dorsal foot wound. He has been doing well with hyperbaric oxygen therapy. He has no issues or complaints today. He denies signs of of infection. 12/14; patient presents for follow-up. We have been using Grafix to  the wound beds. He has had trouble equalizing the pressure in his ears during HBO treatments. It was recommended he follow-up with ENT He does not have an appointment yet. He currently denies signs of infection. . 12/21; patient presents for follow-up. He was seen by ENT and had tubes placed. He is continued HBO without issues. We have been using Grafix to the wound beds. There is been improvement in wound healing. 12/21; patient presents for follow-up. He has no issues or complaints today. There is been improvement in wound healing. 1/5; patient presents for follow-up. At last clinic visit Grafix was placed in standard fashion. He has no issues or complaints today. He is tolerating hyperbaric oxygen therapy well. He denies signs of infection. Electronic Signature(s) Signed: 07/10/2022 11:39:44 AM By: [name] DO Entered By: [name] on 07/10/2022 09:30:24 -------------------------------------------------------------------------------- Physical Exam Details Patient Name: Date of Service: [name], [name]. 07/10/2022 8:45 [name] Medical Record Number: [id number] Patient Account Number: [id number] Date of Birth/Sex: Treating RN: 02-06-1956 (67 y.o. M) Primary Care Provider: [name] Other Clinician: Referring Provider: Treating  Provider/Extender: [name], [name] in Treatment: 12 Constitutional respirations regular, non-labored and within target range for patient.. Cardiovascular 2+ dorsalis pedis/posterior tibialis pulses. Psychiatric pleasant and cooperative. Notes Left foot: T the lateral aspect there is an open wound with granulation tissue and nonviable tissue. No probing to bone . T the dorsal aspect there is an open DIONISIOS, RICCI [name] ([id number]) [id number] Page 6 of 14 wound with granulation tissue and non viable tissue present. No signs of surrounding soft tissue infection. Electronic Signature(s) Signed: 07/10/2022 11:39:44 AM By: [name] DO Entered By: [name] on 07/10/2022 09:30:47 -------------------------------------------------------------------------------- Physician Orders Details Patient Name: Date of Service: [name], [name]. 07/10/2022 8:45 [name] Medical Record Number: [id number] Patient Account Number: [id number] Date of Birth/Sex: Treating RN: 09/16/1955 (67 y.o. [name] Pop, [name] Primary Care Provider: [name] Other Clinician: Referring Provider: Treating Provider/Extender: [name] in Treatment: 12 Verbal / Phone Orders: No Diagnosis Coding Follow-up Appointments ppointment in 1 week. - W/ Dr. Heber [place] (ALREADY HAS APPT 01/12) . Return [name] ppointment in 2 weeks. - w/ Dr. Heber [place] Return [name] Anesthetic (In clinic) Topical Lidocaine 5% applied to wound bed Cellular or Tissue Based Products Cellular or Tissue Based Product Type: - Run IVR for Grafix for left dorsal foot- pending Grafix # 1 applied 06/11/22 Grafix # 2 applied 06/18/22 Grafix # 3 applied 06/25/22 GRafix # 4 applied 07/02/22 Grafix # 5 applied 07/10/22 Edema Control - Lymphedema / SCD / Other Avoid standing for long periods of time. Off-Loading Open toe surgical shoe to: - wear when standing or  walking. Hyperbaric Oxygen Therapy Evaluate for HBO Therapy Indication: - Wagner Grade III to left lateral foot 2.5 ATA for 90 Minutes with 2 Five (5) Minute [name] Breaks ir Total Number of Treatments: - 40 One treatments per day (delivered Monday through Friday unless otherwise specified in Special Instructions below): Finger stick Blood Glucose Pre- and Post- HBOT Treatment. Follow Hyperbaric Oxygen Glycemia Protocol Afrin (Oxymetazoline HCL) 0.05% nasal spray - 1 spray in both nostrils daily as needed prior to HBO treatment for difficulty clearing ears Wound Treatment Wound #4 - Foot Wound Laterality: Dorsal, Left Cleanser: Wound Cleanser (Home Health) 1 x Per Day/30 Days Discharge Instructions: Cleanse the wound with wound cleanser prior to applying [name] clean dressing using gauze sponges, not tissue or cotton balls. [name]-Wound Care:  Skin Prep (Home Health) 1 x Per Day/30 Days Discharge Instructions: Use skin prep as directed Prim Dressing: Grafix 1 x Per Day/30 Days ary Discharge Instructions: Secure with adaptic and steri strips Secondary Dressing: ABD Pad, 8x10 (Home Health) 1 x Per Day/30 Days Discharge Instructions: Apply over primary dressing as directed. Secured With: [employer], 4.5x3.1 (in/yd) (Home Health) 1 x Per Day/30 Days Discharge Instructions: Secure with Kerlix as directed. Secured With: 92M Medipore H Soft Cloth Surgical T ape, 4 x 10 (in/yd) (Home Health) 1 x Per Day/30 Days Discharge Instructions: Secure with tape as directed. Wound #5 - Foot Wound Laterality: Left, Lateral [name], [name] ([id number]) [phone].pdf Page 7 of 14 Cleanser: Wound Cleanser (Home Health) 1 x Per Day/30 Days Discharge Instructions: Cleanse the wound with wound cleanser prior to applying [name] clean dressing using gauze sponges, not tissue or cotton balls. [name]-Wound Care: Skin Prep (Home Health) 1 x Per Day/30 Days Discharge Instructions: Use skin prep as  directed Prim Dressing: Grafix ary 1 x Per Day/30 Days Secondary Dressing: ABD Pad, 8x10 (Home Health) 1 x Per Day/30 Days Discharge Instructions: Apply over primary dressing as directed. Secured With: [employer], 4.5x3.1 (in/yd) (Home Health) 1 x Per Day/30 Days Discharge Instructions: Secure with Kerlix as directed. Secured With: 92M Medipore H Soft Cloth Surgical T ape, 4 x 10 (in/yd) (Home Health) 1 x Per Day/30 Days Discharge Instructions: Secure with tape as directed. GLYCEMIA INTERVENTIONS PROTOCOL PRE-HBO GLYCEMIA INTERVENTIONS ACTION INTERVENTION Obtain pre-HBO capillary blood glucose (ensure 1 physician order is in chart). [name]. Notify HBO physician and await physician orders. 2 If result is 70 mg/dl or below: B. If the result meets the hospital definition of [name] critical result, follow hospital policy. [name]. Give patient an 8 ounce Glucerna Shake, an 8 ounce Ensure, or 8 ounces of [name] Glucerna/Ensure equivalent dietary supplement*. B. Wait 30 minutes. If result is 71 mg/dl to 130 mg/dl: C. Retest patients capillary blood glucose (CBG). D. If result greater than or equal to 110 mg/dl, proceed with HBO. If result less than 110 mg/dl, notify HBO physician and consider holding HBO. If result is 131 mg/dl to 249 mg/dl: [name]. Proceed with HBO. [name]. Notify HBO physician and await physician orders. B. It is recommended to hold HBO and do If result is 250 mg/dl or greater: blood/urine ketone testing. C. If the result meets the hospital definition of [name] critical result, follow hospital policy. POST-HBO GLYCEMIA INTERVENTIONS ACTION INTERVENTION Obtain post HBO capillary blood glucose (ensure 1 physician order is in chart). [name]. Notify HBO physician and await physician orders. 2 If result is 70 mg/dl or below: B. If the result meets the hospital definition of [name] critical result, follow hospital policy. [name]. Give patient an 8 ounce Glucerna Shake, an 8 ounce Ensure, or 8  ounces of [name] Glucerna/Ensure equivalent dietary supplement*. B. Wait 15 minutes for symptoms of If result is 71 mg/dl to 100 mg/dl: hypoglycemia (i.e. nervousness, anxiety, sweating, chills, clamminess, irritability, confusion, tachycardia or dizziness). C. If patient asymptomatic, discharge patient. If patient symptomatic, repeat capillary blood glucose (CBG) and notify HBO physician. If result is 101 mg/dl to 249 mg/dl: [name]. Discharge patient. [name]. Notify HBO physician and await physician orders. B. It is recommended to do blood/urine ketone If result is 250 mg/dl or greater: testing. C. If the result meets the hospital definition of [name] critical result, follow hospital policy. *Juice or candies are NOT equivalent products. If patient refuses the Glucerna or Ensure, please  consult the hospital dietitian for an appropriate substitute. Electronic Signature(s) Signed: 07/10/2022 11:39:44 AM By: [name] DO Entered By: [name] on 07/10/2022 09:30:55 [name] ([id number]) [id number] Page 8 of 14 -------------------------------------------------------------------------------- Problem List Details Patient Name: Date of Service: [name], [name]. 07/10/2022 8:45 [name] Medical Record Number: [id number] Patient Account Number: [id number] Date of Birth/Sex: Treating RN: Mar 04, 1956 (67 y.o. M) Primary Care Provider: [name] Other Clinician: Referring Provider: Treating Provider/Extender: [name], [name] in Treatment: 12 Active Problems ICD-10 Encounter Code Description Active Date MDM Diagnosis [phone] Non-pressure chronic ulcer of other part of left foot with fat layer exposed 04/14/2022 No Yes M86.172 Other acute osteomyelitis, left ankle and foot 05/08/2022 No Yes L97.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 specified 04/14/2022 No Yes severity E11.621 Type 2 diabetes mellitus with  foot ulcer 04/14/2022 No Yes E11.42 Type 2 diabetes mellitus with diabetic polyneuropathy 04/14/2022 No Yes Inactive Problems Resolved Problems Electronic Signature(s) Signed: 07/10/2022 11:39:44 AM By: [name] DO Entered By: [name] on 07/10/2022 09:29:21 -------------------------------------------------------------------------------- Progress Note Details Patient Name: Date of Service: [name], [name]. 07/10/2022 8:45 [name] Medical Record Number: [id number] Patient Account Number: [id number] Date of Birth/Sex: Treating RN: [birth year] (67 y.o. M) Primary Care Provider: [name] Other Clinician: Referring Provider: Treating Provider/Extender: [name], [name] in Treatment: 12 Subjective Chief Complaint Information obtained from Patient 10/16/20; patient is here with [name] substantial wound on his right foot in the setting of [name] recent transmetatarsal amputation. 04/14/2022; referral from podiatry for postsurgical wound of nonhealing partial left fifth ray amputation [name], [name] ([id number]) [phone] Page 9 of 14 History of Present Illness (HPI) ADMISSION 10/16/20 This is [name] 67 year old man who is [name] type II diabetic. Initially seen by Dr. Unk Lightning of vein and vascular on 09/10/2021 for bilateral lower extremity rest pain right greater than left. His ABIs demonstrated monophasic waveforms at the ankles bilaterally with depressed toe pressures. His ABI in the right was 0.5 and on the left 0.28. There were no obtainable waveforms for TBI's. He underwent angiography on 09/10/2021 and had findings of severe PAD with 95% stenosis of the distal SFA. He also had peroneal and posterior tibial arteries occluded with significant collateralization in the calf there was reconstitution of the posterior tibial artery in the distal third of the calf. Ostia of the anterior tibial artery was not appreciated. He underwent [name] angioplasty of  the superficial femoral artery. He required admission to hospital from 09/12/2021 through 09/22/2021 with right foot cellulitis and sepsis. On 3/13 he underwent [name] right below-knee popliteal to posterior tibial bypass using [name] greater saphenous vein. Ultimately however he required [name] right TMA by podiatry which I believe was done on 3/17 for progressive diabetic foot infection. He was discharged to [facility] skilled facility. He arrives in clinic today with [name] large wound area over the entirety of his amputation site extending proximally. The attempted flap closure of the amputation site and his TMA has failed and the wound extends under the attempt to closure. Still some sutures in place. There is an odor but he is not systemically unwell. He is due for follow-up arterial studies and has an appointment with vascular surgery on 10/28/2021. They are using wet-to-dry dressings at [facility]. Past medical history includes type 2 diabetes with peripheral neuropathy, hypertension, obstructive sleep apnea 4/20; patient presents for follow-up. He is being discharged from his facility at [facility] today. They  have been doing Dakin's wet-to-dry dressings. He states that his fiance can help with dressing changes. He is getting [facility] home [facility] to come out 3 times [name] week once he leaves the facility. He is scheduled to see vein and vascular on 5/12. He has not seen the wound himself. He puts covers over his face for wound exams. He currently denies systemic signs of infection. 4/28; patient admitted to the clinic 2 weeks ago with [name] dehisced right TMA in the setting of type 2 diabetes with significant PAD. He is status post revascularization. He has been discharged from the nursing home he was at and now is at home using Dakin's wet-to-dry dressings daily he has home health. He denies any systemic signs of infection 5/4; patient presents for follow-up. His fiance and home health have been changing his dressings. He  currently denies systemic signs of infection. He continues to put sheet covers over his face during our wound encounters because he does not want [name] look at the wound. 5/25; Patient presents for follow-up. He missed his last clinic appointment. He had [name] bone biopsy and culture done at last clinic visit that showed acute osteomyelitis with growth of E. coli, stenotrophomonas maltophilia and enteroccocus faecalis. [name] referral to infectious disease was made. He has not heard from them yet. He has been using Dakin's wet-to-dry dressings. He is now more comfortable with looking at the wound bed. 6/6; the patient comes into clinic today with 2 wounds on the left foot which apparently have been there for several months. This was not known to assess but was known by Dr. Donzetta Matters. In fact he underwent an angiogram yesterday. He had [name] laser atherectomy of the left posterior tibial artery with [name] 3 mm balloon angioplasty, also [name] stent of the last left SFA. 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 This is [name] lot better than the last time I saw it. He has been using Dakin's wet-to-dry. He has an appointment with infectious disease for [name] bone biopsy which showed E. coli, stenotrophomonas and Enterococcus faecalis. The appointment is for tomorrow 04/14/2022 [name] [name] is an uncontrolled type 2 diabetic on insulin that presents with [name] left foot ulcer. On 12/18/2021 he required [name] left fifth ray amputation. He had revision of this site on 6/17 by Dr. Posey Pronto, podiatry. He states he has had [name] wound VAC on this area for the past 3 weeks. It is unclear what the prior wound care has been. He has [name] history of osteomyelitis to this area and was treated with IV and oral antibiotics For 6 weeks by Dr. Linus Salmons of infectious disease. At his last follow-up on 02/20/2022 his  CRP was trending down and antibiotics were stopped. He also has [name] history of peripheral arterial disease status post stenting to the left SFA and balloon angioplasty to the left posterior tibial artery On 12/2021. He has follow-up with vein and vascular at the end of the month. He also has [name] wound to the dorsal aspect of the left foot that he has been placing Betadine on. 10/19; patient presents for follow-up. He had [name] bone biopsy of the left foot at last clinic visit showed osteomyelitis. The bone culture showed Enterobacter cloacae, Enterococcus faecalis, Staphylococcus aureus, viridans group strep, actinotigum schaelii. He has been referred to infectious  disease And has an appointment on October 25. He has been using Dakin's wet-to-dry dressing to the lateral left foot wound and Hydrofera Blue and Medihoney to the dorsal foot wound. He has no issues or complaints today. He denies systemic signs of infection. 10/26; patient presents for follow-up. He saw Dr. Linus Salmons yesterday with infectious disease and has been prescribed doxycycline and Levaquin for his chronic osteomyelitis. He has been using Dakin's wet-to-dry dressings to the lateral left foot wound and Medihoney and Hydrofera Blue to the dorsal wound. We discussed [name] skin graft for the dorsal foot wound and he would like to proceed with insurance verification. 11/3; patient with [name] wound on the dorsal left foot and [name] substantial postsurgical area on the lateral left foot fifth ray amputation. He is also followed by Dr. Alton Revere of infectious disease. He had [name] bone biopsy from the left lateral foot that showed acute osteomyelitis. Swab culture showed multiple organisms. He has also been revascularized by infectious disease status post left SFA stenting and tibial laser atherectomy and angioplasty. An MRI of the left foot had previously shown osteomyelitis of the fifth met head we have been using Dakin's wet-to-dry to the left lateral foot and Medihoney  and Hydrofera Blue to the wound on the dorsal foot. 11/13; HBO treatment was discussed at last clinic visit and patient would like to proceed with this. He has orientation today. He has been approved for Grafix. We have been using Hydrofera Blue and Medihoney to the left dorsal foot and collagen to the left lateral foot. Patient has home health that comes in for dressing changes. He currently has no issues or complaints today. 12/7; patient missed his last clinic appointment. He was last seen 3 weeks ago. He is currently off of antibiotics per ID. He completed [name] prolonged treatment. He has been using collagen to the lateral foot wound. He has been using Medihoney and Hydrofera Blue to the dorsal foot wound. He has been doing well with hyperbaric oxygen therapy. He has no issues or complaints today. He denies signs of of infection. 12/14; patient presents for follow-up. We have been using Grafix to the wound beds. He has had trouble equalizing the pressure in his ears during HBO treatments. It was recommended he follow-up with ENT He does not have an appointment yet. He currently denies signs of infection. . 12/21; patient presents for follow-up. He was seen by ENT and had tubes placed. He is continued HBO without issues. We have been using Grafix to the wound beds. There is been improvement in wound healing. 12/21; patient presents for follow-up. He has no issues or complaints today. There is been improvement in wound healing. 1/5; patient presents for follow-up. At last clinic visit Grafix was placed in standard fashion. He has no issues or complaints today. He is tolerating hyperbaric oxygen therapy well. He denies signs of infection. Patient History Information obtained from Patient, Chart. Family History Diabetes - Mother,Siblings, Heart Disease - Siblings, Hypertension - Siblings, Stroke - Mother, No family history of Cancer, Hereditary Spherocytosis, Kidney Disease, Lung Disease, Seizures,  Thyroid Problems, Tuberculosis. QUAN, CYBULSKI [name] ([id number]) [id number] Page 10 of 63 Social History Former smoker, Marital Status - Single, Alcohol Use - Never, Drug Use - No History, Caffeine Use - Daily. Medical History Eyes Denies history of Cataracts, Glaucoma, Optic Neuritis Respiratory Patient has history of Asthma, Sleep Apnea Denies history of Aspiration, Chronic Obstructive Pulmonary Disease (COPD), Pneumothorax, Tuberculosis Cardiovascular Patient has history of Hypertension, Peripheral Venous Disease Denies  history of Angina, Arrhythmia, Congestive Heart Failure, Coronary Artery Disease, Deep Vein Thrombosis, Hypotension, Myocardial Infarction, Peripheral Arterial Disease, Phlebitis, Vasculitis Endocrine Patient has history of Type II Diabetes Denies history of Type I Diabetes Integumentary (Skin) Denies history of History of Burn Musculoskeletal Patient has history of Osteomyelitis Denies history of Gout, Rheumatoid Arthritis, Osteoarthritis Neurologic Patient has history of Neuropathy Denies history of Dementia, Quadriplegia, Paraplegia, Seizure Disorder Hospitalization/Surgery History - 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 Posey Pronto. - aortogram 12/08/2021 Dr. Virl Cagey [facility]. Medical [name] Surgical History Notes nd Psychiatric PTSD Objective Constitutional respirations regular, non-labored and within target range for patient.. Vitals Time Taken: 8:33 AM, Height: 72 in, Weight: 220 lbs, BMI: 29.8, Temperature: 98.6 F, Pulse: 105 bpm, Respiratory Rate: 18 breaths/min, Blood Pressure: 182/81 mmHg, Capillary Blood Glucose: 241 mg/dl. Cardiovascular 2+ dorsalis pedis/posterior tibialis pulses. Psychiatric pleasant and cooperative. General Notes: Left foot: T the lateral aspect there is an open wound with granulation tissue and nonviable tissue. No probing to bone . T the dorsal aspect o o there is an open wound with  granulation tissue and non viable tissue present. No signs of surrounding soft tissue infection. Integumentary (Hair, Skin) Wound #4 status is Open. Original cause of wound was Gradually Appeared. The date acquired was: 09/03/2021. The wound has been in treatment 12 weeks. The wound is located on the Left,Dorsal Foot. The wound measures 0.5cm length x 0.4cm width x 0.1cm depth; 0.157cm^2 area and 0.016cm^3 volume. There is Fat Layer (Subcutaneous Tissue) exposed. There is [name] medium amount of serosanguineous drainage noted. The wound margin is distinct with the outline attached to the wound base. There is large (67-100%) pink, pale granulation within the wound bed. There is no necrotic tissue within the wound bed. The periwound skin appearance had no abnormalities noted for moisture. The periwound skin appearance had no abnormalities noted for color. The periwound skin appearance did not exhibit: Callus, Crepitus, Excoriation, Induration, Rash, Scarring. Periwound temperature was noted as No Abnormality. Wound #5 status is Open. Original cause of wound was Surgical Injury. The date acquired was: 12/20/2021. The wound has been in treatment 12 weeks. The wound is located on the Left,Lateral Foot. The wound measures 5.2cm length x 0.7cm width x 0.4cm depth; 2.859cm^2 area and 1.144cm^3 volume. There is bone and Fat Layer (Subcutaneous Tissue) exposed. There is [name] medium amount of serosanguineous drainage noted. Foul odor after cleansing was noted. The wound margin is epibole. There is large (67-100%) red, pink granulation within the wound bed. There is [name] small (1-33%) amount of necrotic tissue within the wound bed including Adherent Slough. The periwound skin appearance had no abnormalities noted for color. The periwound skin appearance exhibited: Callus. The periwound skin appearance did not exhibit: Crepitus, Excoriation, Induration, Rash, Scarring, Dry/Scaly, Maceration. Periwound temperature was noted as No  Abnormality. Assessment Active Problems ICD-10 Non-pressure chronic ulcer of other part of left foot with fat layer exposed Other acute osteomyelitis, left ankle and foot Non-pressure chronic ulcer of other part of left foot with other specified severity Type 2 diabetes mellitus with foot ulcer Type 2 diabetes mellitus with diabetic polyneuropathy [name], [name] ([id number]) [phone].pdf Page 11 of 14 Patient's wounds have shown improvement in size and appearance since last clinic visit. His wounds are well-healing. The dorsal foot wound is almost healed. I debrided nonviable tissue. Grafix was placed in standard fashion today. I recommended continuing hyperbaric oxygen. Follow-up in 1 week. Procedures Wound #4 Pre-procedure diagnosis of Wound #4 is [name] Diabetic  Wound/Ulcer of the Lower Extremity located on the Left,Dorsal Foot .Severity of Tissue Pre Debridement is: Fat layer exposed. There was [name] Excisional Skin/Subcutaneous Tissue Debridement with [name] total area of 0.2 sq cm performed by [name], DO. With the following instrument(s): Curette to remove Viable and Non-Viable tissue/material. Material removed includes Subcutaneous Tissue and Slough and after achieving pain control using Lidocaine. No specimens were taken. [name] time out was conducted at 09:00, prior to the start of the procedure. [name] Minimum amount of bleeding was controlled with Pressure. The procedure was tolerated well with [name] pain level of 0 throughout and [name] pain level of 0 following the procedure. Post Debridement Measurements: 0.5cm length x 0.4cm width x 0.1cm depth; 0.016cm^3 volume. Character of Wound/Ulcer Post Debridement is improved. Severity of Tissue Post Debridement is: Fat layer exposed. Post procedure Diagnosis Wound #4: Same as Pre-Procedure Pre-procedure diagnosis of Wound #4 is [name] Diabetic Wound/Ulcer of the Lower Extremity located on the Left,Dorsal Foot. [name] skin graft procedure  using [name] bioengineered skin substitute/cellular or tissue based product was performed by [name], DO with the following instrument(s): Forceps and Scissors. Grafix prime was applied and secured with Steri-Strips. 1 sq cm of product was utilized and 5 sq cm was wasted due to wound size; applied to other wound. Post Application, ADAPTIC was applied. [name] Time Out was conducted at 09:05, prior to the start of the procedure. The procedure was tolerated well with [name] pain level of 0 throughout and [name] pain level of 0 following the procedure. Post procedure Diagnosis Wound #4: Same as Pre-Procedure . Wound #5 Pre-procedure diagnosis of Wound #5 is [name] Diabetic Wound/Ulcer of the Lower Extremity located on the Left,Lateral Foot .Severity of Tissue Pre Debridement is: Fat layer exposed. There was [name] Excisional Skin/Subcutaneous Tissue Debridement with [name] total area of 3.64 sq cm performed by [name], DO. With the following instrument(s): Curette to remove Viable and Non-Viable tissue/material. Material removed includes Subcutaneous Tissue and Slough and after achieving pain control using Lidocaine. No specimens were taken. [name] time out was conducted at 09:00, prior to the start of the procedure. [name] Minimum amount of bleeding was controlled with Pressure. The procedure was tolerated well with [name] pain level of 0 throughout and [name] pain level of 0 following the procedure. Post Debridement Measurements: 5.2cm length x 0.7cm width x 0.4cm depth; 1.144cm^3 volume. Character of Wound/Ulcer Post Debridement is improved. Severity of Tissue Post Debridement is: Fat layer exposed. Post procedure Diagnosis Wound #5: Same as Pre-Procedure Pre-procedure diagnosis of Wound #5 is [name] Diabetic Wound/Ulcer of the Lower Extremity located on the Left,Lateral Foot. [name] skin graft procedure using [name] bioengineered skin substitute/cellular or tissue based product was performed by [name], DO with the following instrument(s):  Forceps and Scissors. Grafix prime was applied and secured with Steri-Strips. 5 sq cm of product was utilized and 0 sq cm was wasted. Post Application, ADAPTIC was applied. [name] Time Out was conducted at 09:05, prior to the start of the procedure. The procedure was tolerated well with [name] pain level of 0 throughout and [name] pain level of 0 following the procedure. Post procedure Diagnosis Wound #5: Same as Pre-Procedure . Plan Follow-up Appointments: Return Appointment in 1 week. - W/ Dr. Heber Rosenberg (ALREADY HAS APPT 01/12) . Return Appointment in 2 weeks. - w/ Dr. Heber [place] Anesthetic: (In clinic) Topical Lidocaine 5% applied to wound bed Cellular or Tissue Based Products: Cellular or Tissue Based Product Type: - Run IVR for Grafix for left  dorsal foot- pending Grafix # 1 applied 06/11/22 Grafix # 2 applied 06/18/22 Grafix # 3 applied 06/25/22 GRafix # 4 applied 07/02/22 Grafix # 5 applied 07/10/22 Edema Control - Lymphedema / SCD / Other: Avoid standing for long periods of time. Off-Loading: Open toe surgical shoe to: - wear when standing or walking. Hyperbaric Oxygen Therapy: Evaluate for HBO Therapy Indication: - Wagner Grade III to left lateral foot 2.5 ATA for 90 Minutes with 2 Five (5) Minute Air Breaks T Number of Treatments: - 40 otal One treatments per day (delivered Monday through Friday unless otherwise specified in Special Instructions below): Finger stick Blood Glucose Pre- and Post- HBOT Treatment. Follow Hyperbaric Oxygen Glycemia Protocol Afrin (Oxymetazoline HCL) 0.05% nasal spray - 1 spray in both nostrils daily as needed prior to HBO treatment for difficulty clearing ears WOUND #4: - Foot Wound Laterality: Dorsal, Left Cleanser: Wound Cleanser (Home Health) 1 x Per Day/30 Days Discharge Instructions: Cleanse the wound with wound cleanser prior to applying [name] clean dressing using gauze sponges, not tissue or cotton balls. [name]-Wound Care: Skin Prep (Home Health) 1 x Per Day/30  Days Discharge Instructions: Use skin prep as directed Prim Dressing: Grafix 1 x Per Day/30 Days ary Discharge Instructions: Secure with adaptic and steri strips Secondary Dressing: ABD Pad, 8x10 (Home Health) 1 x Per Day/30 Days Discharge Instructions: Apply over primary dressing as directed. Secured With: [employer], 4.5x3.1 (in/yd) (Home Health) 1 x Per Day/30 Days Discharge Instructions: Secure with Kerlix as directed. Secured With: 38M Medipore H Soft Cloth Surgical T ape, 4 x 10 (in/yd) (Home Health) 1 x Per Day/30 Days Discharge Instructions: Secure with tape as directed. [name], [name] ([id number]) [id number] Page 12 of 14 WOUND #5: - Foot Wound Laterality: Left, Lateral Cleanser: Wound Cleanser (Home Health) 1 x Per Day/30 Days Discharge Instructions: Cleanse the wound with wound cleanser prior to applying [name] clean dressing using gauze sponges, not tissue or cotton balls. [name]-Wound Care: Skin Prep (Home Health) 1 x Per Day/30 Days Discharge Instructions: Use skin prep as directed Prim Dressing: Grafix 1 x Per Day/30 Days ary Secondary Dressing: ABD Pad, 8x10 (Home Health) 1 x Per Day/30 Days Discharge Instructions: Apply over primary dressing as directed. Secured With: [employer], 4.5x3.1 (in/yd) (Home Health) 1 x Per Day/30 Days Discharge Instructions: Secure with Kerlix as directed. Secured With: 38M Medipore H Soft Cloth Surgical T ape, 4 x 10 (in/yd) (Home Health) 1 x Per Day/30 Days Discharge Instructions: Secure with tape as directed. 1. In office sharp debridement 2. Grafix placed in standard fashion 3. Aggressive offloadingoosurgical shoe 4. Follow-up in 1 week Electronic Signature(s) Signed: 07/10/2022 11:39:44 AM By: [name] DO Entered By: [name] on 07/10/2022 09:33:25 -------------------------------------------------------------------------------- HxROS Details Patient Name: Date of Service: [name]  [name], [name]. 07/10/2022 8:45 [name] Medical Record Number: [id number] Patient Account Number: [id number] Date of Birth/Sex: Treating RN: 1955-08-23 (67 y.o. M) Primary Care Provider: [name] Other Clinician: Referring Provider: Treating Provider/Extender: [name] in Treatment: 12 Information Obtained From Patient Chart Eyes Medical History: Negative for: Cataracts; Glaucoma; Optic Neuritis Respiratory Medical History: Positive for: Asthma; Sleep Apnea Negative for: Aspiration; Chronic Obstructive Pulmonary Disease (COPD); Pneumothorax; Tuberculosis Cardiovascular Medical History: Positive for: Hypertension; Peripheral Venous Disease Negative for: Angina; Arrhythmia; Congestive Heart Failure; Coronary Artery Disease; Deep Vein Thrombosis; Hypotension; Myocardial Infarction; Peripheral Arterial Disease; Phlebitis; Vasculitis Endocrine Medical History: Positive for: Type II Diabetes Negative for: Type I Diabetes Time  with diabetes: 20 years Treated with: Insulin, Oral agents Blood sugar tested every day: Yes Tested : 4x day Integumentary (Skin) Medical History: Negative for: History of Burn Musculoskeletal FREEMON, BINFORD [name] ([id number]) [id number] Page 13 of 14 Medical History: Positive for: Osteomyelitis Negative for: Gout; Rheumatoid Arthritis; Osteoarthritis Neurologic Medical History: Positive for: Neuropathy Negative for: Dementia; Quadriplegia; Paraplegia; Seizure Disorder Psychiatric Medical History: Past Medical History Notes: PTSD Immunizations Pneumococcal Vaccine: Received Pneumococcal Vaccination: No Implantable Devices None Hospitalization / Surgery History Type of Hospitalization/Surgery 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 Posey Pronto aortogram 12/08/2021 Dr. Virl Cagey [facility] Family and Social History Cancer: No; Diabetes: Yes - Mother,Siblings; Heart Disease: Yes - Siblings;  Hereditary Spherocytosis: No; Hypertension: Yes - Siblings; Kidney Disease: No; Lung Disease: No; Seizures: No; Stroke: Yes - Mother; Thyroid Problems: No; Tuberculosis: No; Former smoker; Marital Status - Single; Alcohol Use: Never; Drug Use: No History; Caffeine Use: Daily; Financial Concerns: No; Food, Clothing or Shelter Needs: No; Support System Lacking: No; Transportation Concerns: No Electronic Signature(s) Signed: 07/10/2022 11:39:44 AM By: [name] DO Entered By: [name] on 07/10/2022 09:30:29 -------------------------------------------------------------------------------- SuperBill Details Patient Name: Date of Service: [name], [name]. 07/10/2022 Medical Record Number: [id number] Patient Account Number: [id number] Date of Birth/Sex: Treating RN: 10-10-1955 (67 y.o. [name] Pop, [name] Primary Care Provider: [name] Other Clinician: Referring Provider: Treating Provider/Extender: [name], [name] in Treatment: 12 Diagnosis Coding ICD-10 Codes Code Description 5208210283 Non-pressure chronic ulcer of other part of left foot with fat layer exposed M86.172 Other acute osteomyelitis, left ankle and foot L97.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 specified severity E11.621 Type 2 diabetes mellitus with foot ulcer E11.42 Type 2 diabetes mellitus with diabetic polyneuropathy Facility Procedures : CPT4 Code: 37106269 Description: Q4133- Grafix PL 2X3 sq cm (6 units) Modifier: Quantity: 6 : IZEAR, PINE Code: [id number] AAZIR [name] ([id number]) Description: 29937 - SKIN SUB GRAFT FACE/NK/HF/G ICD-10 Diagnosis Description L97.522 Non-pressure chronic ulcer of other part of left foot with fat layer exposed [id number] Modifier: [id number] Quantity: 1 df Page 14 of 14 : Vernon Code: Gratz. Description: - DEB SUBQ TISSUE 20 SQ CM/< 0 Diagnosis Description 528 Non-pressure chronic  ulcer of other part of left foot with other specified severity Modifier: 1 Quantity: Physician Procedures : CPT4 Code Description Modifier [id number] 15275 - WC PHYS SKIN SUB GRAFT FACE/NK/HF/G ICD-10 Diagnosis Description L97.522 Non-pressure chronic ulcer of other part of left foot with fat layer exposed Quantity: 1 : [id number] 11042 - WC PHYS SUBQ TISS 20 SQ CM ICD-10 Diagnosis Description L97.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 specified severity Quantity: 1 Electronic Signature(s) Signed: 07/10/2022 11:39:44 AM By: [name] DO Entered By: [name] on 07/10/2022 09:33:41

## 2022-07-10 NOTE — Progress Notes (Signed)
[name], MCFARREN [name] ([id number]) [id number] Page 1 of 2 Visit Report for 07/10/2022 Arrival Information Details Patient Name: Date of Service: [name], [name] 07/10/2022 10:00 [name] Medical Record Number: [id number] Patient Account Number: [id number] Date of Birth/Sex: Treating RN: 17-Mar-1956 (67 y.o. [name] Primary Care Newell Frater: [name] Other Clinician: [name] Referring Mollyann Halbert: Treating Eduardo Wurth/Extender: [name] in Treatment: 12 Visit Information History Since Last Visit All ordered tests and consults were completed: Yes Patient Arrived: Wheel Chair Added or deleted any medications: No Arrival Time: 09:40 Any new allergies or adverse reactions: No Accompanied By: self Had [name] fall or experienced change in No Transfer Assistance: None activities of daily living that may affect Patient Identification Verified: Yes risk of falls: Secondary Verification Process Completed: Yes Signs or symptoms of abuse/neglect since last visito No Patient Requires Transmission-Based Precautions: No Hospitalized since last visit: No Patient Has Alerts: Yes Implantable device outside of the clinic excluding No Patient Alerts: ESBL;MRSA cellular tissue based products placed in the center since last visit: Pain Present Now: No Electronic Signature(s) Signed: 07/10/2022 1:08:55 PM By: [name] CHT EMT BS , , Entered By: [name] on 07/10/2022 13:08:54 -------------------------------------------------------------------------------- Encounter Discharge Information Details Patient Name: Date of Service: [name], Ruso [name] 07/10/2022 10:00 [name] Medical Record Number: [id number] Patient Account Number: [id number] Date of Birth/Sex: Treating RN: [birth year] (67 y.o. [name] Primary Care Shiv Shuey: [name] Other Clinician: [name] Referring Tanyiah Laurich: Treating  Jahfari Ambers/Extender: [name] in Treatment: 12 Encounter Discharge Information Items Discharge Condition: Stable Ambulatory Status: Wheelchair Discharge Destination: Home Transportation: Private Auto Accompanied By: self Schedule Follow-up Appointment: No Clinical Summary of Care: Electronic Signature(s) Signed: 07/10/2022 1:31:10 PM By: [name] CHT EMT BS , , Entered By: [name] on 07/10/2022 13:31:09 [name] ([id number]) [id number] Page 2 of 2 -------------------------------------------------------------------------------- Vitals Details Patient Name: Date of Service: [name], [name] 07/10/2022 10:00 [name] Medical Record Number: [id number] Patient Account Number: [id number] Date of Birth/Sex: Treating RN: [birth year] (67 y.o. [name] Primary Care Keyasha Miah: [name] Other Clinician: [name] Referring Kelby Adell: Treating Tyrah Broers/Extender: [name], [name] in Treatment: 12 Vital Signs Time Taken: 09:44 Temperature (F): 98.6 Height (in): 72 Pulse (bpm): 94 Weight (lbs): 220 Respiratory Rate (breaths/min): 18 Body Mass Index (BMI): 29.8 Blood Pressure (mmHg): 120/70 Capillary Blood Glucose (mg/dl): 294 Reference Range: 80 - 120 mg / dl Electronic Signature(s) Signed: 07/10/2022 1:09:24 PM By: [name] CHT EMT BS , , Entered By: [name] on 07/10/2022 13:09:24

## 2022-07-10 NOTE — Progress Notes (Signed)
[name] ([id number]) [id number] Page 1 of 2 Visit Report for 07/10/2022 HBO Details Patient Name: Date of Service: [name], [name]. 07/10/2022 10:00 [name] Medical Record Number: [id number] Patient Account Number: [id number] Date of Birth/Sex: Treating RN: 1955/09/25 (67 y.o. [name] Primary Care Clatie Kessen: [name] Other Clinician: [name] Referring Jennika Ringgold: Treating Abena Erdman/Extender: [name] in Treatment: 12 HBO Treatment Course Details Treatment Course Number: 1 Ordering Coralyn Roselli: [name] T Treatments Ordered: otal 40 HBO Treatment Start Date: 06/01/2022 HBO Indication: Diabetic Ulcer(s) of the Lower Extremity Standard/Conservative Wound Care tried and failed greater than or equal to 30 days Wound #4 Left, Dorsal Foot , Wound #5 Left, Lateral Foot HBO Treatment Details Treatment Number: 23 Patient Type: Outpatient Chamber Type: Monoplace Chamber Serial #: [id number] Treatment Protocol: 2.5 ATA with 90 minutes oxygen, with two 5 minute air breaks Treatment Details Compression Rate Down: 2.0 psi / minute De-Compression Rate Up: Compress Tx Pressure [name] breaks and breathing periods Decompress Decompress ir Begins Reached (leave unused spaces blank) Begins Ends Chamber Pressure (ATA 1 2.5 2.5 2.5 2.5 2.5 1 2.5 2.5 1 ) Clock Time (24 hr) 09:52 10:04 10:34 10:39 11:09 11:14 11:16 11:31 12:01 12:07 Treatment Length: 135 (minutes) Treatment Segments: 4 Vital Signs Capillary Blood Glucose Reference Range: 80 - 120 mg / dl HBO Diabetic Blood Glucose Intervention Range: <131 mg/dl or >249 mg/dl Type: Time Vitals Blood Pulse: Respiratory Temperature: Capillary Blood Glucose Pulse Action Taken: Pressure: Rate: Glucose (mg/dl): Meter #: Oximetry (%) Taken: Pre 09:44 120/70 94 18 98.6 294 1 patient asymptomatic for hyperglycemia Post 12:15 128/58 69 18 97.7 144 1 none per protocol Treatment  Response Treatment Toleration: Well Treatment Completion Status: Treatment Completed without Adverse Event Treatment Notes Patient arrived for treatment after wound care visit, blood glucose level checked with [name] result of 294 mg/dL. Patient stated that he ate Egg and Cheese, [place] Sausage, grits, and toast with coffee. Patient self-administered Afrin. Patient tolerated compression of the chamber at 1 psi/min until reaching 3 psig and 2 psi/min thereafter upon confirmation of no issues with ear equalization. Patient tolerated treatment until second air break at which point he had to use the restroom. Informed physician, travelled chamber at 2.5 psi/min, used the restroom and then opted to continue treatment. Decompression started at 1116, reached surface at 1124. Recompression of the chamber was started at 1127 travelling at 2.5 psi/min as well and patient tolerated the remaining 28 minutes of treatment. Patient has pressure equalization tubes. Final decompression was at the rate of 2.5 psi/min, and patient tolerated well. Patient was stable upon discharge, driving himself home. Additional Procedure Documentation Tissue Sevierity: Necrosis of bone Electronic Signature(s) Signed: 07/10/2022 1:29:25 PM By: [name] CHT EMT BS , , Entered By: [name] on 07/10/2022 13:29:25 [name] ([id number]) [id number] Page 2 of 2 -------------------------------------------------------------------------------- HBO Safety Checklist Details Patient Name: Date of Service: [name], [name]. 07/10/2022 10:00 [name] Medical Record Number: [id number] Patient Account Number: [id number] Date of Birth/Sex: Treating RN: Oct 06, 1955 (67 y.o. [name] Primary Care Abdalrahman Clementson: [name] Other Clinician: [name] Referring Jermia Rigsby: Treating Keedan Sample/Extender: [name] in Treatment: 12 HBO Safety Checklist Items Safety  Checklist Consent Form Signed Patient voided / foley secured and emptied When did you last eato 0700 Last dose of injectable or oral agent 0630 Ostomy pouch emptied and vented if applicable NA All implantable devices assessed, documented and approved NA  Intravenous access site secured and place NA Valuables secured Linens and cotton and cotton/polyester blend (less than 51% polyester) Personal oil-based products / skin lotions / body lotions removed NA Wigs or hairpieces removed NA Smoking or tobacco materials removed Books / newspapers / magazines / loose paper removed Cologne, aftershave, perfume and deodorant removed Jewelry removed (may wrap wedding band) Make-up removed NA Hair care products removed Battery operated devices (external) removed Heating patches and chemical warmers removed Titanium eyewear removed Nail polish cured greater than 10 hours NA Casting material cured greater than 10 hours NA Hearing aids removed NA Loose dentures or partials removed NA Prosthetics have been removed NA Patient demonstrates correct use of air break device (if applicable) Patient concerns have been addressed Patient grounding bracelet on and cord attached to chamber Specifics for Inpatients (complete in addition to above) Medication sheet sent with patient NA Intravenous medications needed or due during therapy sent with patient NA Drainage tubes (e.g. nasogastric tube or chest tube secured and vented) NA Endotracheal or Tracheotomy tube secured NA Cuff deflated of air and inflated with saline NA Airway suctioned NA Notes Paper version used prior to treatment. Electronic Signature(s) Signed: 07/10/2022 1:10:25 PM By: [name] CHT EMT BS , , Entered By: [name] on 07/10/2022 13:10:24

## 2022-07-11 NOTE — Progress Notes (Signed)
JONOVAN, BOEDECKER A ([id number]) [id number] Page 1 of 10 Visit Report for 07/10/2022 Arrival Information Details Patient Name: Date of Service: [name], [name] 07/10/2022 8:45 A M Medical Record Number: [id number] Patient Account Number: [id number] Date of Birth/Sex: Treating RN: [birth year] (67 y.o. M) Primary Care Secilia Apps: [name] Other Clinician: Referring Seward Coran: Treating Chandell Attridge/Extender: [name] in Treatment: 12 Visit Information History Since Last Visit Added or deleted any medications: No Patient Arrived: Wheel Chair Any new allergies or adverse reactions: No Arrival Time: 08:32 Had a fall or experienced change in No Accompanied By: self activities of daily living that may affect Transfer Assistance: Manual risk of falls: Patient Identification Verified: Yes Signs or symptoms of abuse/neglect since last visito No Secondary Verification Process Completed: Yes Hospitalized since last visit: No Patient Requires Transmission-Based Precautions: No Implantable device outside of the clinic excluding No Patient Has Alerts: Yes cellular tissue based products placed in the center Patient Alerts: ESBL;MRSA since last visit: Has Dressing in Place as Prescribed: Yes Pain Present Now: No Electronic Signature(s) Signed: 07/10/2022 1:57:33 PM By: Sandre Kitty Entered By: Sandre Kitty on 07/10/2022 08:33:00 -------------------------------------------------------------------------------- Encounter Discharge Information Details Patient Name: Date of Service: [name], [name] 07/10/2022 8:45 A M Medical Record Number: [id number] Patient Account Number: [id number] Date of Birth/Sex: Treating RN: [birth year] (67 y.o. [name], [name] Primary Care Daxton Nydam: [name] Other Clinician: Referring Kent Riendeau: Treating Dreama Kuna/Extender: [name] in Treatment: 12 Encounter Discharge  Information Items Post Procedure Vitals Discharge Condition: Stable Temperature (F): 98.7 Ambulatory Status: Wheelchair Pulse (bpm): 74 Discharge Destination: Home Respiratory Rate (breaths/min): 17 Transportation: Private Auto Blood Pressure (mmHg): 120/80 Accompanied By: self Schedule Follow-up Appointment: Yes Clinical Summary of Care: Patient Declined Electronic Signature(s) Signed: 07/10/2022 12:14:03 PM By: Rhae Hammock RN Entered By: Rhae Hammock on 07/10/2022 09:08:17 [name] ([id number]) [id number] Page 2 of 10 -------------------------------------------------------------------------------- Lower Extremity Assessment Details Patient Name: Date of Service: [name], [name] 07/10/2022 8:45 A M Medical Record Number: [id number] Patient Account Number: [id number] Date of Birth/Sex: Treating RN: 11-Jun-1956 (67 y.o. [name], [name] Primary Care Sueanne Maniaci: [name] Other Clinician: Referring Delores Thelen: Treating Berdine Rasmusson/Extender: [name], [name] in Treatment: 12 Edema Assessment Assessed: [name]: Yes] [name]: No] Edema: [Left: Ye] [Right: s] Calf Left: Right: Point of Measurement: 36 cm From Medial Instep 38 cm Ankle Left: Right: Point of Measurement: 9 cm From Medial Instep 23.3 cm Vascular Assessment Pulses: Dorsalis Pedis Palpable: [Left:Yes] Posterior Tibial Palpable: [Left:Yes] Electronic Signature(s) Signed: 07/10/2022 12:14:03 PM By: Rhae Hammock RN Entered By: Rhae Hammock on 07/10/2022 08:55:45 -------------------------------------------------------------------------------- Multi Wound Chart Details Patient Name: Date of Service: [name], [name] 07/10/2022 8:45 A M Medical Record Number: [id number] Patient Account Number: [id number] Date of Birth/Sex: Treating RN: 08-02-1955 (67 y.o. M) Primary Care Waino Mounsey: [name] Other Clinician: Referring Belem Hintze: Treating  Sheilla Maris/Extender: [name], [name] in Treatment: 12 Vital Signs Height(in): 72 Capillary Blood Glucose(mg/dl): 241 Weight(lbs): 220 Pulse(bpm): 105 Body Mass Index(BMI): 29.8 Blood Pressure(mmHg): 182/81 Temperature(F): 98.6 Respiratory Rate(breaths/min): 18 [4:Photos:] [N/A:N/A [id number] Page 3 of 10] Left, Dorsal Foot Left, Lateral Foot N/A Wound Location: Gradually Appeared Surgical Injury N/A Wounding Event: Diabetic Wound/Ulcer of the Lower Diabetic Wound/Ulcer of the Lower N/A Primary Etiology: Extremity Extremity N/A Open Surgical Wound N/A Secondary Etiology: Asthma, Sleep Apnea, Hypertension, Asthma, Sleep Apnea, Hypertension, N/A Comorbid History: Peripheral Venous Disease, Type II Peripheral Venous Disease, Type II  Diabetes, Osteomyelitis, Neuropathy Diabetes, Osteomyelitis, Neuropathy 09/03/2021 12/20/2021 N/A Date Acquired: 12 12 N/A Weeks of Treatment: Open Open N/A Wound Status: No No N/A Wound Recurrence: 0.5x0.4x0.1 5.2x0.7x0.4 N/A Measurements L x W x D (cm) 0.157 2.859 N/A A (cm) : rea 0.016 1.144 N/A Volume (cm) : 93.30% 71.40% N/A % Reduction in A rea: 96.60% 77.10% N/A % Reduction in Volume: Grade 3 Grade 3 N/A Classification: Medium Medium N/A Exudate A mount: Serosanguineous Serosanguineous N/A Exudate Type: red, brown red, brown N/A Exudate Color: No Yes N/A Foul Odor A Cleansing: fter N/A No N/A Odor A nticipated Due to Product Use: Distinct, outline attached Epibole N/A Wound Margin: Large (67-100%) Large (67-100%) N/A Granulation A mount: Pink, Pale Red, Pink N/A Granulation Quality: None Present (0%) Small (1-33%) N/A Necrotic A mount: Fat Layer (Subcutaneous Tissue): Yes Fat Layer (Subcutaneous Tissue): Yes N/A Exposed Structures: Fascia: No Bone: Yes Tendon: No Fascia: No Muscle: No Tendon: No Joint: No Muscle: No Bone: No Joint: No Small (1-33%) Small  (1-33%) N/A Epithelialization: Debridement - Excisional Debridement - Excisional N/A Debridement: Pre-procedure Verification/Time Out 09:00 09:00 N/A Taken: Lidocaine Lidocaine N/A Pain Control: Subcutaneous, Slough Subcutaneous, Slough N/A Tissue Debrided: Skin/Subcutaneous Tissue Skin/Subcutaneous Tissue N/A Level: 0.2 3.64 N/A Debridement A (sq cm): rea Curette Curette N/A Instrument: Minimum Minimum N/A Bleeding: Pressure Pressure N/A Hemostasis A chieved: 0 0 N/A Procedural Pain: 0 0 N/A Post Procedural Pain: Procedure was tolerated well Procedure was tolerated well N/A Debridement Treatment Response: 0.5x0.4x0.1 5.2x0.7x0.4 N/A Post Debridement Measurements L x W x D (cm) 0.016 1.144 N/A Post Debridement Volume: (cm) Excoriation: No Callus: Yes N/A Periwound Skin Texture: Induration: No Excoriation: No Callus: No Induration: No Crepitus: No Crepitus: No Rash: No Rash: No Scarring: No Scarring: No Maceration: No Maceration: No N/A Periwound Skin Moisture: Dry/Scaly: No Dry/Scaly: No Atrophie Blanche: No Atrophie Blanche: No N/A Periwound Skin Color: Cyanosis: No Cyanosis: No Ecchymosis: No Ecchymosis: No Erythema: No Erythema: No Hemosiderin Staining: No Hemosiderin Staining: No Mottled: No Mottled: No Pallor: No Pallor: No Rubor: No Rubor: No No Abnormality No Abnormality N/A Temperature: Cellular or Tissue Based Product Cellular or Tissue Based Product N/A Procedures Performed: Debridement Debridement Treatment Notes Wound #4 (Foot) Wound Laterality: Dorsal, Left Cleanser Wound Cleanser Discharge Instruction: Cleanse the wound with wound cleanser prior to applying a clean dressing using gauze sponges, not tissue or cotton balls. Peri-Wound Care Skin Prep Discharge Instruction: Use skin prep as directed [name], [name] ([id number]) [phone].pdf Page 4 of 10 Topical Primary Dressing Grafix Discharge  Instruction: Secure with adaptic and steri strips Secondary Dressing ABD Pad, 8x10 Discharge Instruction: Apply over primary dressing as directed. Secured With [employer], 4.5x3.1 (in/yd) Discharge Instruction: Secure with Kerlix as directed. 69M Medipore H Soft Cloth Surgical T ape, 4 x 10 (in/yd) Discharge Instruction: Secure with tape as directed. Compression Wrap Compression Stockings Add-Ons Wound #5 (Foot) Wound Laterality: Left, Lateral Cleanser Wound Cleanser Discharge Instruction: Cleanse the wound with wound cleanser prior to applying a clean dressing using gauze sponges, not tissue or cotton balls. Peri-Wound Care Skin Prep Discharge Instruction: Use skin prep as directed Topical Primary Dressing Grafix Secondary Dressing ABD Pad, 8x10 Discharge Instruction: Apply over primary dressing as directed. Secured With [employer], 4.5x3.1 (in/yd) Discharge Instruction: Secure with Kerlix as directed. 69M Medipore H Soft Cloth Surgical T ape, 4 x 10 (in/yd) Discharge Instruction: Secure with tape as directed. Compression Wrap Compression Stockings Add-Ons Electronic Signature(s) Signed: 07/10/2022 11:39:44 AM By: Heber [place],  Janett Billow DO Entered By: [name] on 07/10/2022 09:29:27 -------------------------------------------------------------------------------- Multi-Disciplinary Care Plan Details Patient Name: Date of Service: [name], [name] 07/10/2022 8:45 A M Medical Record Number: [id number] Patient Account Number: [id number] Date of Birth/Sex: Treating RN: [birth year] (67 y.o. [name], [name] Primary Care Airyonna Franklyn: [name] Other Clinician: Referring Eowyn Tabone: Treating Mirissa Lopresti/Extender: [name], [name] in Treatment: [address] A ([id number]) [id number] Page 5 of 10 Active Inactive Osteomyelitis Nursing Diagnoses: Infection: osteomyelitis Goals: Diagnostic evaluation for  osteomyelitis completed as ordered Date Initiated: 04/23/2022 Target Resolution Date: 08/07/2022 Goal Status: Active Signs and symptoms for osteomyelitis will be recognized and promptly addressed Date Initiated: 04/23/2022 Target Resolution Date: 08/07/2022 Goal Status: Active Interventions: Assess for signs and symptoms of osteomyelitis resolution every visit Provide education on osteomyelitis Screen for HBO Treatment Activities: Biopsy : 04/16/2022 Consult for HBO : 04/23/2022 Surgical debridement : 04/23/2022 Systemic antibiotics : 04/23/2022 T ordered outside of clinic : 04/23/2022 est Notes: Wound/Skin Impairment Nursing Diagnoses: Knowledge deficit related to ulceration/compromised skin integrity Goals: Patient/caregiver will verbalize understanding of skin care regimen Date Initiated: 04/14/2022 Target Resolution Date: 08/06/2022 Goal Status: Active Interventions: Assess patient/caregiver ability to perform ulcer/skin care regimen upon admission and as needed Assess ulceration(s) every visit Provide education on ulcer and skin care Notes: Electronic Signature(s) Signed: 07/10/2022 12:14:03 PM By: Rhae Hammock RN Entered By: Rhae Hammock on 07/10/2022 08:40:01 -------------------------------------------------------------------------------- Pain Assessment Details Patient Name: Date of Service: [name], [name] Leaf [name] 07/10/2022 8:45 A M Medical Record Number: [id number] Patient Account Number: [id number] Date of Birth/Sex: Treating RN: 03-17-1956 (67 y.o. M) Primary Care Nashla Althoff: [name] Other Clinician: Referring Linna Thebeau: Treating Deliyah Muckle/Extender: [name] in Treatment: 12 Active Problems Location of Pain Severity and Description of Pain Patient Has Paino No Site Locations FERGUSON, GERTNER A ([id number]) [id number] Page 6 of 10 Pain Management and Medication Current Pain Management: Electronic  Signature(s) Signed: 07/10/2022 1:57:33 PM By: Sandre Kitty Entered By: Sandre Kitty on 07/10/2022 08:37:00 -------------------------------------------------------------------------------- Patient/Caregiver Education Details Patient Name: Date of Service: [name], [name] 1/5/2024andnbsp8:45 A M Medical Record Number: [id number] Patient Account Number: [id number] Date of Birth/Gender: Treating RN: [birth year] (67 y.o. [name] Primary Care Physician: [name] Other Clinician: Referring Physician: Treating Physician/Extender: [name] in Treatment: 12 Education Assessment Education Provided To: Patient Education Topics Provided Wound/Skin Impairment: Methods: Explain/Verbal Responses: Reinforcements needed, State content correctly [employer]) Signed: 07/10/2022 12:14:03 PM By: Rhae Hammock RN Entered By: Rhae Hammock on 07/10/2022 08:40:12 -------------------------------------------------------------------------------- Wound Assessment Details Patient Name: Date of Service: [name], [name] 07/10/2022 8:45 A M Medical Record Number: [id number] Patient Account Number: [id number] Date of Birth/Sex: Treating RN: 1955-12-06 (67 y.o. [name] Primary Care Kimora Stankovic: [name] Other Clinician: Referring Carmalita Wakefield: Treating Danaisha Celli/Extender: Virgie, Chery A ([id number]) [id number] Page 7 of 10 Weeks in Treatment: 12 Wound Status Wound Number: 4 Primary Diabetic Wound/Ulcer of the Lower Extremity Etiology: Wound Location: Left, Dorsal Foot Wound Open Wounding Event: Gradually Appeared Status: Date Acquired: 09/03/2021 Comorbid Asthma, Sleep Apnea, Hypertension, Peripheral Venous Disease, Weeks Of Treatment: 12 History: Type II Diabetes, Osteomyelitis, Neuropathy Clustered Wound: No Photos Wound Measurements Length: (cm) 0.5 Width:  (cm) 0.4 Depth: (cm) 0.1 Area: (cm) 0.157 Volume: (cm) 0.016 % Reduction in Area: 93.3% % Reduction in Volume: 96.6% Epithelialization: Small (1-33%) Wound Description Classification: Grade 3 Wound Margin: Distinct, outline attached Exudate Amount: Medium Exudate Type: Serosanguineous  Exudate Color: red, brown Foul Odor After Cleansing: No Slough/Fibrino Yes Wound Bed Granulation Amount: Large (67-100%) Exposed Structure Granulation Quality: Pink, Pale Fascia Exposed: No Necrotic Amount: None Present (0%) Fat Layer (Subcutaneous Tissue) Exposed: Yes Tendon Exposed: No Muscle Exposed: No Joint Exposed: No Bone Exposed: No Periwound Skin Texture Texture Color No Abnormalities Noted: No No Abnormalities Noted: Yes Callus: No Temperature / Pain Crepitus: No Temperature: No Abnormality Excoriation: No Induration: No Rash: No Scarring: No Moisture No Abnormalities Noted: Yes Treatment Notes Wound #4 (Foot) Wound Laterality: Dorsal, Left Cleanser Wound Cleanser Discharge Instruction: Cleanse the wound with wound cleanser prior to applying a clean dressing using gauze sponges, not tissue or cotton balls. Peri-Wound Care Skin Prep Discharge Instruction: Use skin prep as directed Topical PHILLIP, MAFFEI A ([id number]) [phone].pdf Page 8 of 10 Primary Dressing Grafix Discharge Instruction: Secure with adaptic and steri strips Secondary Dressing ABD Pad, 8x10 Discharge Instruction: Apply over primary dressing as directed. Secured With [employer], 4.5x3.1 (in/yd) Discharge Instruction: Secure with Kerlix as directed. 77M Medipore H Soft Cloth Surgical T ape, 4 x 10 (in/yd) Discharge Instruction: Secure with tape as directed. Compression Wrap Compression Stockings Add-Ons Electronic Signature(s) Signed: 07/10/2022 4:55:01 PM By: [name] RN, [name] Entered By: [name] on 07/10/2022  08:42:46 -------------------------------------------------------------------------------- Wound Assessment Details Patient Name: Date of Service: [name], [name] 07/10/2022 8:45 A M Medical Record Number: [id number] Patient Account Number: [id number] Date of Birth/Sex: Treating RN: 31-Dec-1955 (67 y.o. [name] Primary Care Lashondra Vaquerano: [name] Other Clinician: Referring Judithann Villamar: Treating Zakkery Dorian/Extender: [name], [name] in Treatment: 12 Wound Status Wound Number: 5 Primary Diabetic Wound/Ulcer of the Lower Extremity Etiology: Wound Location: Left, Lateral Foot Secondary Open Surgical Wound Wounding Event: Surgical Injury Etiology: Date Acquired: 12/20/2021 Wound Open Weeks Of Treatment: 12 Status: Clustered Wound: No Comorbid Asthma, Sleep Apnea, Hypertension, Peripheral Venous Disease, History: Type II Diabetes, Osteomyelitis, Neuropathy Photos Wound Measurements Length: (cm) 5.2 Width: (cm) 0.7 Depth: (cm) 0.4 Area: (cm) 2.859 Volume: (cm) 1.144 % Reduction in Area: 71.4% % Reduction in Volume: 77.1% Epithelialization: Small (1-33%) Wound Description Classification: Grade 3 Wound Margin: KODEE, RAVERT A ([id number]) Exudate Amount: Medium Exudate Type: Serosanguineous Exudate Color: red, brown Foul Odor After Cleansing: Yes Due to Product Use: No [id number] Page 9 of 10 Slough/Fibrino Yes Wound Bed Granulation Amount: Large (67-100%) Exposed Structure Granulation Quality: Red, Pink Fascia Exposed: No Necrotic Amount: Small (1-33%) Fat Layer (Subcutaneous Tissue) Exposed: Yes Necrotic Quality: Adherent Slough Tendon Exposed: No Muscle Exposed: No Joint Exposed: No Bone Exposed: Yes Periwound Skin Texture Texture Color No Abnormalities Noted: No No Abnormalities Noted: Yes Callus: Yes Temperature / Pain Crepitus: No Temperature: No Abnormality Excoriation: No Induration:  No Rash: No Scarring: No Moisture No Abnormalities Noted: No Dry / Scaly: No Maceration: No Treatment Notes Wound #5 (Foot) Wound Laterality: Left, Lateral Cleanser Wound Cleanser Discharge Instruction: Cleanse the wound with wound cleanser prior to applying a clean dressing using gauze sponges, not tissue or cotton balls. Peri-Wound Care Skin Prep Discharge Instruction: Use skin prep as directed Topical Primary Dressing Grafix Secondary Dressing ABD Pad, 8x10 Discharge Instruction: Apply over primary dressing as directed. Secured With [employer], 4.5x3.1 (in/yd) Discharge Instruction: Secure with Kerlix as directed. 77M Medipore H Soft Cloth Surgical T ape, 4 x 10 (in/yd) Discharge Instruction: Secure with tape as directed. Compression Wrap Compression Stockings Add-Ons Electronic Signature(s) Signed: 07/10/2022 4:55:01 PM By: [name] RN, [name] Entered By: Rolin Barry,  Bobbi on 07/10/2022 08:44:07 -------------------------------------------------------------------------------- Vitals Details Patient Name: Date of Service: [name], [name] 07/10/2022 8:45 A JOHNROBERT, FOTI A ([id number]) [phone].pdf Page 10 of 10 Medical Record Number: [id number] Patient Account Number: [id number] Date of Birth/Sex: Treating RN: April 05, 1956 (67 y.o. M) Primary Care Shauntea Lok: [name] Other Clinician: Referring Heydy Montilla: Treating Ashwath Lasch/Extender: [name], [name] in Treatment: 12 Vital Signs Time Taken: 08:33 Temperature (F): 98.6 Height (in): 72 Pulse (bpm): 105 Weight (lbs): 220 Respiratory Rate (breaths/min): 18 Body Mass Index (BMI): 29.8 Blood Pressure (mmHg): 182/81 Capillary Blood Glucose (mg/dl): 241 Reference Range: 80 - 120 mg / dl Electronic Signature(s) Signed: 07/10/2022 1:57:33 PM By: Sandre Kitty Entered By: Sandre Kitty on 07/10/2022 08:36:54

## 2022-07-13 ENCOUNTER — Encounter (HOSPITAL_BASED_OUTPATIENT_CLINIC_OR_DEPARTMENT_OTHER): Payer: Medicare HMO | Admitting: Internal Medicine

## 2022-07-13 DIAGNOSIS — M86172 Other acute osteomyelitis, left ankle and foot: Secondary | ICD-10-CM | POA: Diagnosis not present

## 2022-07-13 DIAGNOSIS — E11621 Type 2 diabetes mellitus with foot ulcer: Secondary | ICD-10-CM | POA: Diagnosis not present

## 2022-07-13 DIAGNOSIS — L97522 Non-pressure chronic ulcer of other part of left foot with fat layer exposed: Secondary | ICD-10-CM

## 2022-07-13 DIAGNOSIS — L97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 specified severity: Secondary | ICD-10-CM | POA: Diagnosis not present

## 2022-07-13 LAB — GLUCOSE, CAPILLARY
Glucose-Capillary: 144 mg/dL — ABNORMAL HIGH (ref 70–99)
Glucose-Capillary: 295 mg/dL — ABNORMAL HIGH (ref 70–99)
Glucose-Capillary: 342 mg/dL — ABNORMAL HIGH (ref 70–99)

## 2022-07-13 NOTE — Progress Notes (Addendum)
SALIL, RAINERI [name] ([id number]) [id number] Page 1 of 2 Visit Report for 07/13/2022 HBO Details Patient Name: Date of Service: [name], [name]. 07/13/2022 8:00 [name] Medical Record Number: [id number] Patient Account Number: [id number] Date of Birth/Sex: Treating RN: 05-06-1956 (67 y.o. [name] Primary Care Poseidon Pam: [name] Other Clinician: Valeria Batman Referring [name]: Treating [name]: [name] in Treatment: 12 HBO Treatment Course Details Treatment Course Number: 1 Ordering Kamyia Thomason: [name] T Treatments Ordered: otal 40 HBO Treatment Start Date: 06/01/2022 HBO Indication: Diabetic Ulcer(s) of the Lower Extremity Standard/Conservative Wound Care tried and failed greater than or equal to 30 days Wound #4 Left, Dorsal Foot , Wound #5 Left, Lateral Foot HBO Treatment Details Treatment Number: 24 Patient Type: Outpatient Chamber Type: Monoplace Chamber Serial #: [id number] Treatment Protocol: 2.5 ATA with 90 minutes oxygen, with two 5 minute air breaks Treatment Details Compression Rate Down: 2.0 psi / minute De-Compression Rate Up: 2.0 psi / minute [name] breaks and breathing ir Compress Tx Pressure periods Decompress Decompress Begins Reached (leave unused spaces Begins Ends blank) Chamber Pressure (ATA 1 2.5 2.5 2.5 2.5 2.5 - - 2.5 1 ) Clock Time (24 hr) 08:54 09:07 09:37 09:42 10:12 10:17 - - 10:47 10:55 Treatment Length: 121 (minutes) Treatment Segments: 4 Vital Signs Capillary Blood Glucose Reference Range: 80 - 120 mg / dl HBO Diabetic Blood Glucose Intervention Range: <131 mg/dl or >249 mg/dl Type: Time Vitals Blood Pulse: Respiratory Temperature: Capillary Blood Glucose Pulse Action Taken: Pressure: Rate: Glucose (mg/dl): Meter #: Oximetry (%) Taken: Pre 08:23 125/66 98 18 98.4 295 Dr. Celine [name] informed of blood sugar Post 11:01 141/81 85 18 97.5 342 Dr. Heber [name] informed of blood  sugar Treatment Response Treatment Toleration: Well Treatment Completion Status: Treatment Completed without Adverse Event Additional Procedure Documentation Tissue Sevierity: Necrosis of bone Physician HBO Attestation: I certify that I supervised this HBO treatment in accordance with Medicare guidelines. [name] trained emergency response team is readily available per Yes hospital policies and procedures. Continue HBOT as ordered. Yes Electronic Signature(s) Signed: 07/13/2022 4:03:49 PM By: [name] DO Previous Signature: 07/13/2022 3:03:12 PM Version By: Valeria Batman EMT Entered By: [name] on 07/13/2022 16:03:10 [name] ([id number]) [id number] Page 2 of 2 -------------------------------------------------------------------------------- HBO Safety Checklist Details Patient Name: Date of Service: [name], [name]. 07/13/2022 8:00 [name] Medical Record Number: [id number] Patient Account Number: [id number] Date of Birth/Sex: Treating RN: 1956-04-22 (67 y.o. [name] Primary Care Srihitha Tagliaferri: [name] Other Clinician: Valeria Batman Referring Teia Freitas: Treating Adaja Wander/Extender: [name] in Treatment: 12 HBO Safety Checklist Items Safety Checklist Consent Form Signed Patient voided / foley secured and emptied When did you last eato 0800 Last dose of injectable or oral agent 0715 Ostomy pouch emptied and vented if applicable NA All implantable devices assessed, documented and approved NA Intravenous access site secured and place NA Valuables secured Linens and cotton and cotton/polyester blend (less than 51% polyester) Personal oil-based products / skin lotions / body lotions removed Wigs or hairpieces removed NA Smoking or tobacco materials removed Books / newspapers / magazines / loose paper removed Cologne, aftershave, perfume and deodorant removed Jewelry removed (may wrap wedding  band) Make-up removed NA Hair care products removed Battery operated devices (external) removed Heating patches and chemical warmers removed Titanium eyewear removed NA Nail polish cured greater than 10 hours NA Casting material cured greater than 10 hours NA Hearing aids removed NA Loose dentures  or partials removed removed by patient Prosthetics have been removed NA Patient demonstrates correct use of air break device (if applicable) Patient concerns have been addressed Patient grounding bracelet on and cord attached to chamber Specifics for Inpatients (complete in addition to above) Medication sheet sent with patient NA Intravenous medications needed or due during therapy sent with patient NA Drainage tubes (e.g. nasogastric tube or chest tube secured and vented) NA Endotracheal or Tracheotomy tube secured NA Cuff deflated of air and inflated with saline NA Airway suctioned NA Notes The safety checklist was done before the treatment was started. Patient stated that he had an egg, cheese biscuit with orange juice before coming in for treatment today. Electronic Signature(s) Signed: 07/13/2022 2:54:13 PM By: Valeria Batman EMT Entered By: Valeria Batman on 07/13/2022 14:54:12

## 2022-07-13 NOTE — Progress Notes (Signed)
AHAN, EISENBERGER A ([id number]) [id number] Page 1 of 1 Visit Report for 07/10/2022 SuperBill Details Patient Name: Date of Service: [name], [name] 07/10/2022 Medical Record Number: [id number] Patient Account Number: [id number] Date of Birth/Sex: Treating RN: 07/18/1955 (67 y.o. [name] Primary Care Provider: [name] Other Clinician: [name] Referring Provider: Treating Provider/Extender: [name] in Treatment: 12 Diagnosis Coding ICD-10 Codes Code Description [phone] Non-pressure chronic ulcer of other part of left foot with fat layer exposed M86.172 Other acute osteomyelitis, left ankle and foot L97.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 specified severity E11.621 Type 2 diabetes mellitus with foot ulcer E11.42 Type 2 diabetes mellitus with diabetic polyneuropathy Facility Procedures CPT4 Code Description Modifier Quantity [id number] G0277-(Facility Use Only) HBOT full body chamber, 63mn , 4 ICD-10 Diagnosis Description E11.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 M86.172 Other acute osteomyelitis, left ankle and foot Physician Procedures Quantity CPT4 Code Description Modifier [phone] - WC PHYS HYPERBARIC OXYGEN THERAPY 1 ICD-10 Diagnosis Description E11.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 M86.172 Other acute osteomyelitis, left ankle and foot Electronic Signature(s) Signed: 07/10/2022 1:30:17 PM By: SDonavan BurnetCHT EMT BS , , Signed: 07/13/2022 12:33:43 PM By: HKalman Shan DO Entered By: SDonavan Burneton 07/10/2022 13:30:17

## 2022-07-13 NOTE — Progress Notes (Signed)
VIRAT, PRATHER A ([id number]) [id number] Page 1 of 2 Visit Report for 07/13/2022 Problem List Details Patient Name: Date of Service: [name], [name] 07/13/2022 8:00 A M Medical Record Number: [id number] Patient Account Number: [id number] Date of Birth/Sex: Treating RN: Nov 09, 1955 (67 y.o. [name] Primary Care Provider: [name] Other Clinician: Valeria Batman Referring Provider: Treating Provider/Extender: [name] in Treatment: 12 Active Problems ICD-10 Encounter Code Description Active Date MDM Diagnosis [phone] Non-pressure chronic ulcer of other part of left foot with fat 04/14/2022 No Yes layer exposed M86.172 Other acute osteomyelitis, left ankle and foot 05/08/2022 No Yes L97.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 04/14/2022 No Yes specified severity E11.621 Type 2 diabetes mellitus with foot ulcer 04/14/2022 No Yes E11.42 Type 2 diabetes mellitus with diabetic polyneuropathy 04/14/2022 No Yes Inactive Problems Resolved Problems Electronic Signature(s) Signed: 07/13/2022 3:03:52 PM By: Valeria Batman EMT Signed: 07/13/2022 4:03:49 PM By: [name] DO Entered By: Valeria Batman on 07/13/2022 15:03:51 -------------------------------------------------------------------------------- SuperBill Details Patient Name: Date of Service: [name], [place] [name] 07/13/2022 Medical Record Number: [id number] Patient Account Number: [id number] Date of Birth/Sex: Treating RN: [birth year] (67 y.o. [name] Primary Care Provider: [name] Other Clinician: Valeria Batman Referring Provider: Treating Provider/Extender: [name] in Treatment: [address] Diagnosis Coding ARAV, BANNISTER A ([id number]) [id number] Page 2 of 2 ICD-10 Codes Code Description [phone] Non-pressure chronic ulcer of other part of left foot with fat  layer exposed M86.172 Other acute osteomyelitis, left ankle and foot L97.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 specified severity E11.621 Type 2 diabetes mellitus with foot ulcer E11.42 Type 2 diabetes mellitus with diabetic polyneuropathy Facility Procedures : CPT4 Code Description: [id number] G0277-(Facility Use Only) HBOT full body chamber, 54mn , ICD-10 Diagnosis Description E11.621 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.522 Non-pressure chronic ulcer of other part of left foot with f L97.528  Non-pressure chronic ulcer of other part of left foot with o M86.172 Other acute osteomyelitis, left ankle and foot Modifier: at layer exposed ther specified se Quantity: 4 verity Physician Procedures : CPT4 Code Description Modifier [id number]- WC PHYS HYPERBARIC OXYGEN THERAPY ICD-10 Diagnosis Description E11.621 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.528  Non-pressure chronic ulcer of other part of left foot with other specified se M86.172 Other acute osteomyelitis, left ankle and foot Quantity: 1 verity Electronic Signature(s) Signed: 07/13/2022 3:03:45 PM By: GValeria BatmanEMT Signed: 07/13/2022 4:03:49 PM By: HKalman ShanDO Entered By: GValeria Batmanon 07/13/2022 15:03:45

## 2022-07-13 NOTE — Progress Notes (Signed)
YASIN, DUCAT A ([id number]) [id number] Page 1 of 2 Visit Report for 07/13/2022 Arrival Information Details Patient Name: Date of Service: [name], [name] 07/13/2022 8:00 A M Medical Record Number: [id number] Patient Account Number: [id number] Date of Birth/Sex: Treating RN: [birth year] (67 y.o. [name] Primary Care Jamy Whyte: [name] Other Clinician: Valeria Batman Referring Oneta Sigman: Treating Deriyah Kunath/Extender: [name] in Treatment: 12 Visit Information History Since Last Visit All ordered tests and consults were completed: Yes Patient Arrived: Wheel Chair Added or deleted any medications: No Arrival Time: 08:19 Any new allergies or adverse reactions: No Accompanied By: None Had a fall or experienced change in No Transfer Assistance: EasyPivot Patient Lift activities of daily living that may affect Patient Identification Verified: Yes risk of falls: Secondary Verification Process Completed: Yes Signs or symptoms of abuse/neglect since last visito No Patient Requires Transmission-Based Precautions: No Hospitalized since last visit: No Patient Has Alerts: Yes Implantable device outside of the clinic excluding No Patient Alerts: ESBL;MRSA cellular tissue based products placed in the center since last visit: Pain Present Now: No Electronic Signature(s) Signed: 07/13/2022 2:50:39 PM By: Valeria Batman EMT Entered By: Valeria Batman on 07/13/2022 14:50:39 -------------------------------------------------------------------------------- Encounter Discharge Information Details Patient Name: Date of Service: [name], [name] 07/13/2022 8:00 A M Medical Record Number: [id number] Patient Account Number: [id number] Date of Birth/Sex: Treating RN: February 14, 1956 (67 y.o. [name] Primary Care Valeen Borys: [name] Other Clinician: Valeria Batman Referring Taeja Debellis: Treating Audrina Marten/Extender: [name] in Treatment: 12 Encounter Discharge Information Items Discharge Condition: Stable Ambulatory Status: Wheelchair Discharge Destination: Home Transportation: Private Auto Accompanied By: None Schedule Follow-up Appointment: Yes Clinical Summary of Care: Electronic Signature(s) Signed: 07/13/2022 3:04:36 PM By: Valeria Batman EMT Entered By: Valeria Batman on 07/13/2022 15:04:36 [name] ([id number]) [id number] Page 2 of 2 -------------------------------------------------------------------------------- Vitals Details Patient Name: Date of Service: [name], [name] 07/13/2022 8:00 A M Medical Record Number: [id number] Patient Account Number: [id number] Date of Birth/Sex: Treating RN: Dec 07, 1955 (67 y.o. [name] Primary Care Chaniece Barbato: [name] Other Clinician: Valeria Batman Referring Glenna Brunkow: Treating Henriette Hesser/Extender: [name] in Treatment: 12 Vital Signs Time Taken: 08:23 Temperature (F): 98.4 Height (in): 72 Pulse (bpm): 98 Weight (lbs): 220 Respiratory Rate (breaths/min): 18 Body Mass Index (BMI): 29.8 Blood Pressure (mmHg): 125/66 Capillary Blood Glucose (mg/dl): 295 Reference Range: 80 - 120 mg / dl Electronic Signature(s) Signed: 07/13/2022 2:51:14 PM By: Valeria Batman EMT Entered By: Valeria Batman on 07/13/2022 14:51:13

## 2022-07-14 ENCOUNTER — Encounter (HOSPITAL_BASED_OUTPATIENT_CLINIC_OR_DEPARTMENT_OTHER): Payer: Medicare HMO | Admitting: Internal Medicine

## 2022-07-14 ENCOUNTER — Ambulatory Visit: Payer: Medicare HMO | Admitting: Podiatry

## 2022-07-14 DIAGNOSIS — Z89422 Acquired absence of other left toe(s): Secondary | ICD-10-CM | POA: Diagnosis not present

## 2022-07-14 DIAGNOSIS — E11621 Type 2 diabetes mellitus with foot ulcer: Secondary | ICD-10-CM | POA: Diagnosis not present

## 2022-07-14 DIAGNOSIS — M86172 Other acute osteomyelitis, left ankle and foot: Secondary | ICD-10-CM

## 2022-07-14 DIAGNOSIS — L97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 specified severity: Secondary | ICD-10-CM

## 2022-07-14 DIAGNOSIS — Z89431 Acquired absence of right foot: Secondary | ICD-10-CM | POA: Diagnosis not present

## 2022-07-14 DIAGNOSIS — L97522 Non-pressure chronic ulcer of other part of left foot with fat layer exposed: Secondary | ICD-10-CM | POA: Diagnosis not present

## 2022-07-14 DIAGNOSIS — E1142 Type 2 diabetes mellitus with diabetic polyneuropathy: Secondary | ICD-10-CM

## 2022-07-14 LAB — GLUCOSE, CAPILLARY
Glucose-Capillary: 315 mg/dL — ABNORMAL HIGH (ref 70–99)
Glucose-Capillary: 221 mg/dL — ABNORMAL HIGH (ref 70–99)

## 2022-07-14 NOTE — Progress Notes (Addendum)
JILES, GOYA A ([id number]) [id number] Page 1 of 2 Visit Report for 07/14/2022 Arrival Information Details Patient Name: Date of Service: [name], [name] 07/14/2022 11:30 A M Medical Record Number: [id number] Patient Account Number: [id number] Date of Birth/Sex: Treating RN: [birth year] (67 y.o. [name], [name] Primary Care Jahanna Raether: [name] Other Clinician: Valeria Batman Referring Lilibeth Opie: Treating Maeli Spacek/Extender: [name] in Treatment: 13 Visit Information History Since Last Visit All ordered tests and consults were completed: Yes Patient Arrived: Wheel Chair Added or deleted any medications: No Arrival Time: 10:50 Any new allergies or adverse reactions: No Accompanied By: Wife Had a fall or experienced change in No Transfer Assistance: EasyPivot Patient Lift activities of daily living that may affect Patient Identification Verified: Yes risk of falls: Secondary Verification Process Completed: Yes Signs or symptoms of abuse/neglect since last visito No Patient Requires Transmission-Based Precautions: No Hospitalized since last visit: No Patient Has Alerts: Yes Implantable device outside of the clinic excluding No Patient Alerts: ESBL;MRSA cellular tissue based products placed in the center since last visit: Pain Present Now: No Electronic Signature(s) Signed: 07/14/2022 1:06:44 PM By: Valeria Batman EMT Entered By: Valeria Batman on 07/14/2022 13:06:44 -------------------------------------------------------------------------------- Encounter Discharge Information Details Patient Name: Date of Service: [name], [name] 07/14/2022 11:30 A M Medical Record Number: [id number] Patient Account Number: [id number] Date of Birth/Sex: Treating RN: October 17, 1955 (67 y.o. [name], [name] Primary Care Taris Galindo: [name] Other Clinician: Valeria Batman Referring Chirsty Armistead: Treating Dharma Pare/Extender: [name] in Treatment: 13 Encounter Discharge Information Items Discharge Condition: Stable Ambulatory Status: Wheelchair Discharge Destination: Home Transportation: Private Auto Accompanied By: None Schedule Follow-up Appointment: Yes Clinical Summary of Care: Electronic Signature(s) Signed: 07/14/2022 2:53:27 PM By: Valeria Batman EMT Entered By: Valeria Batman on 07/14/2022 14:53:27 [name] ([id number]) [id number] Page 2 of 2 -------------------------------------------------------------------------------- Vitals Details Patient Name: Date of Service: [name], [name] 07/14/2022 11:30 A M Medical Record Number: [id number] Patient Account Number: [id number] Date of Birth/Sex: Treating RN: 06-26-1956 (67 y.o. [name] Primary Care Elainna Eshleman: [name] Other Clinician: Valeria Batman Referring Darline Faith: Treating Jasdeep Kepner/Extender: [name] in Treatment: 13 Vital Signs Time Taken: 10:59 Capillary Blood Glucose (mg/dl): 315 Height (in): 72 Reference Range: 80 - 120 mg / dl Weight (lbs): 220 Body Mass Index (BMI): 29.8 Electronic Signature(s) Signed: 07/14/2022 1:07:03 PM By: Valeria Batman EMT Entered By: Valeria Batman on 07/14/2022 13:07:02

## 2022-07-14 NOTE — Progress Notes (Signed)
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 male returns for post-op check.  Patient states he is doing okay.  He is being followed at the wound care center who is managing the wound.  He is now concerned with the right transmetatarsal amputation wound site.  He has not seen the wound care center yet for this  Review of Systems: Negative except as noted in the HPI. Denies N/V/F/Ch.  Past Medical History:  Diagnosis Date   Diabetes mellitus with peripheral vascular disease    Hypertension    Neuropathy    PTSD (post-traumatic stress disorder)    Sleep apnea     Current Outpatient Medications:    albuterol (VENTOLIN HFA) 108 (90 Base) MCG/ACT inhaler, Inhale 2 puffs into the lungs every 6 (six) hours as needed for wheezing or shortness of breath., Disp: 1 each, Rfl: 0   amLODipine (NORVASC) 5 MG tablet, Take 1 tablet (5 mg total) by mouth every morning., Disp: 30 tablet, Rfl: 0   aspirin EC 81 MG tablet, Take 1 tablet (81 mg total) by mouth daily. Swallow whole., Disp: 150 tablet, Rfl: 2   atorvastatin (LIPITOR) 80 MG tablet, Take 1 tablet (80 mg total) by mouth at bedtime., Disp: 30 tablet, Rfl: 0   buPROPion (WELLBUTRIN XL) 300 MG 24 hr tablet, Take 1 tablet (300 mg total) by mouth every morning., Disp: 30 tablet, Rfl: 0   cholecalciferol (VITAMIN D) 25 MCG (1000 UNIT) tablet, Take 1,000 Units by mouth daily., Disp: , Rfl:    clopidogrel (PLAVIX) 75 MG tablet, Take 1 tablet (75 mg total) by mouth daily., Disp: 30 tablet, Rfl: 11   empagliflozin (JARDIANCE) 25 MG TABS tablet, Take 1 tablet (25 mg total) by mouth daily., Disp: 30 tablet, Rfl: 0   fluticasone furoate-vilanterol (BREO ELLIPTA) 100-25 MCG/INH AEPB, Inhale 1 puff into the lungs daily., Disp: 60 each, Rfl:  5   gabapentin (NEURONTIN) 600 MG tablet, Take 600 mg by mouth 2 (two) times daily., Disp: , Rfl:    HYDROcodone-acetaminophen (NORCO/VICODIN) 5-325 MG tablet, Take 1 tablet by mouth every 6 (six) hours as needed., Disp: , Rfl:    insulin aspart (NOVOLOG) 100 UNIT/ML injection, CBG 70 - 120: 0 units CBG 121 - 150: 1 unit CBG 151 - 200: 2 units CBG 201 - 250: 3 units CBG 251 - 300: 5 units CBG 301 - 350: 7 units CBG 351 - 400: 9 units, Disp: 10 mL, Rfl: 11   insulin glargine-yfgn (SEMGLEE) 100 UNIT/ML injection, Inject 0.2 mLs (20 Units total) into the skin 2 (two) times daily., Disp: 10 mL, Rfl: 11   lisinopril (ZESTRIL) 20 MG tablet, Take 20 mg by mouth daily., Disp: , Rfl:    lurasidone (LATUDA) 40 MG TABS tablet, Take 1 tablet (40 mg total) by mouth daily with supper., Disp: 30 tablet, Rfl: 0   meclizine (ANTIVERT) 25 MG tablet, Take 1 tablet (25 mg total) by mouth 3 (three) times daily as needed for dizziness., Disp: 30 tablet, Rfl: 0   metoprolol tartrate (LOPRESSOR) 50 MG tablet, Take 1 tablet (50 mg total) by mouth 2 (two) times daily., Disp: 60 tablet, Rfl: 0   OLANZapine (ZYPREXA) 2.5 MG tablet, Take 1 tablet (2.5 mg total) by  mouth at bedtime., Disp: 30 tablet, Rfl: 0   omeprazole (PRILOSEC) 20 MG capsule, Take 1 capsule (20 mg total) by mouth at bedtime., Disp: 30 capsule, Rfl: 0   OXcarbazepine (TRILEPTAL) 150 MG tablet, Take 1 tablet (150 mg total) by mouth 2 (two) times daily., Disp: 60 tablet, Rfl: 0   senna-docusate (SENOKOT-S) 8.6-50 MG tablet, Take 1 tablet by mouth at bedtime as needed for mild constipation., Disp: , Rfl:   Social History   Tobacco Use  Smoking Status Former   Types: Cigarettes   Passive exposure: Never  Smokeless Tobacco Never    Allergies  Allergen Reactions   Gemfibrozil Other (See Comments)    Doesn't know its been awhile   Oxycontin [Oxycodone Hcl] Other (See Comments)    Bad reaction "took me to a place I never wanna go again"   Pork-Derived  Products    Simvastatin Other (See Comments)    Says he doesn't know its been awhile   Objective:  There were no vitals filed for this visit. There is no height or weight on file to calculate BMI. Constitutional Well developed. Well nourished.  Vascular Foot warm and well perfused. Capillary refill normal to all digits.   Neurologic Normal speech. Oriented to person, place, and time. Epicritic sensation to light touch grossly present bilaterally.  Dermatologic Left partial fifth ray wound healing well.  Granular wound bed noted no exposure of bone noted. No malodor.  No purulent drainage noted.  Right transmetatarsal wound dehiscence noted limited to the breakdown of the skin.  No infection noted.  Orthopedic: No further tenderness to palpation noted about the surgical site.   Radiographs: None Assessment:   No diagnosis found.     Plan:  Patient was evaluated and treated and all questions answered.  Right transmetatarsal wound superficial dehiscence -Continue Betadine wet-to-dry dressing.  I encouraged the patient to have the wound care center take a look at it and help with healing.  Patient states understanding.  Left lateral foot wound -Clinically healing well.  Wound care center is primarily managing this wound. Looking same.  Referred to the wound care center.  Continue Betadine wet-to-dry Right superficial TMA dehiscence will have the wound care center take a look at it  Left side healing well.

## 2022-07-14 NOTE — Progress Notes (Signed)
[name] ([id number]) [id number] Page 1 of 2 Visit Report for 07/14/2022 HBO Details Patient Name: Date of Service: [name], [name] 07/14/2022 11:30 [name] Medical Record Number: [id number] Patient Account Number: [id number] Date of Birth/Sex: Treating RN: 28-Feb-1956 (67 y.o. [name] Primary Care [name]: [name] Other Clinician: [name] Referring [name]: Treating [name] in Treatment: 13 HBO Treatment Course Details Treatment Course Number: 1 Ordering [name]: [name] T Treatments Ordered: otal 40 HBO Treatment Start Date: 06/01/2022 HBO Indication: Diabetic Ulcer(s) of the Lower Extremity Standard/Conservative Wound Care tried and failed greater than or equal to 30 days Wound #4 Left, Dorsal Foot , Wound #5 Left, Lateral Foot HBO Treatment Details Treatment Number: 25 Patient Type: Outpatient Chamber Type: Monoplace Chamber Serial #: [id number] Treatment Protocol: 2.5 ATA with 90 minutes oxygen, with two 5 minute air breaks Treatment Details Compression Rate Down: 1.5 psi / minute De-Compression Rate Up: [name] breaks and breathing ir Compress Tx Pressure periods Decompress Decompress Begins Reached (leave unused spaces Begins Ends blank) Chamber Pressure (ATA 1 2.5 2.5 2.5 2.5 2.5 - - 2.5 1 ) Clock Time (24 hr) 11:28 11:41 12:11 12:16 12:47 12:52 - - 13:22 13:34 Treatment Length: 126 (minutes) Treatment Segments: 4 Vital Signs Capillary Blood Glucose Reference Range: 80 - 120 mg / dl HBO Diabetic Blood Glucose Intervention Range: <131 mg/dl or >249 mg/dl Type: Time Vitals Blood Pulse: Respiratory Capillary Blood Glucose Pulse Action Temperature: Taken: Pressure: Rate: Glucose (mg/dl): Meter #: Oximetry (%) Taken: Pre 10:59 315 Dr. Heber [place] informed. Pre 11:22 100/58 80 18 98.2 Post 13:41 111/75 72 18 97.5 221 Treatment Response Treatment Toleration:  Well Treatment Completion Status: Treatment Completed without Adverse Event Treatment Notes Dr. Heber [place] informed of the patient blood sugar. Dr. Heber [place] stated to go ahead with treatment. The patient stated that he felt fine. The patient used Afrin 1 spray to each side. Additional Procedure Documentation Tissue Sevierity: Necrosis of bone Physician HBO Attestation: I certify that I supervised this HBO treatment in accordance with Medicare guidelines. [name] trained emergency response team is readily available per Yes hospital policies and procedures. Continue HBOT as ordered. Yes Electronic Signature(s) Signed: 07/16/2022 9:59:16 AM By: [name] Previous Signature: 07/14/2022 2:51:51 PM Version By: [name] EMT Previous Signature: 07/14/2022 1:13:03 PM Version By: [name] EMT [name] ([id number]) [id number] Page 2 of 2 Previous Signature: 07/14/2022 1:13:03 PM Version By: [name] EMT Entered By: [name] on 07/16/2022 09:58:48 -------------------------------------------------------------------------------- HBO Safety Checklist Details Patient Name: Date of Service: [name], [name] 07/14/2022 11:30 [name] Medical Record Number: [id number] Patient Account Number: [id number] Date of Birth/Sex: Treating RN: 03/25/1956 (67 y.o. [name], [name] Primary Care [name]: [name] Other Clinician: [name] Referring [name]: Treating [name]/Extender: [name] in Treatment: 13 HBO Safety Checklist Items Safety Checklist Consent Form Signed Patient voided / foley secured and emptied When did you last eato 0900 Last dose of injectable or oral agent 0820 Ostomy pouch emptied and vented if applicable NA All implantable devices assessed, documented and approved Tympanostomy tubes Intravenous access site secured and place NA Valuables secured Linens and cotton and cotton/polyester blend  (less than 51% polyester) Personal oil-based products / skin lotions / body lotions removed Wigs or hairpieces removed NA Smoking or tobacco materials removed Books / newspapers / magazines / loose paper removed Cologne, aftershave, perfume and deodorant removed Jewelry removed (may wrap wedding band)  Make-up removed NA Hair care products removed Battery operated devices (external) removed Heating patches and chemical warmers removed Titanium eyewear removed NA Nail polish cured greater than 10 hours NA Casting material cured greater than 10 hours NA Hearing aids removed NA Loose dentures or partials removed removed by patient Prosthetics have been removed NA Patient demonstrates correct use of air break device (if applicable) Patient concerns have been addressed Patient grounding bracelet on and cord attached to chamber Specifics for Inpatients (complete in addition to above) Medication sheet sent with patient NA Intravenous medications needed or due during therapy sent with patient NA Drainage tubes (e.g. nasogastric tube or chest tube secured and vented) NA Endotracheal or Tracheotomy tube secured NA Cuff deflated of air and inflated with saline NA Airway suctioned NA Notes The safety checklist was done before the treatment was started. Patient stated that he had an egg and cheese on bagel with orange juice. Electronic Signature(s) Signed: 07/14/2022 1:09:31 PM By: [name] EMT Entered By: [name] on 07/14/2022 13:09:31

## 2022-07-15 ENCOUNTER — Encounter (HOSPITAL_BASED_OUTPATIENT_CLINIC_OR_DEPARTMENT_OTHER): Payer: Medicare HMO | Admitting: Physician Assistant

## 2022-07-15 DIAGNOSIS — E11621 Type 2 diabetes mellitus with foot ulcer: Secondary | ICD-10-CM | POA: Diagnosis not present

## 2022-07-15 LAB — GLUCOSE, CAPILLARY
Glucose-Capillary: 334 mg/dL — ABNORMAL HIGH (ref 70–99)
Glucose-Capillary: 261 mg/dL — ABNORMAL HIGH (ref 70–99)

## 2022-07-15 NOTE — Progress Notes (Signed)
[name] ([id number]) [id number] Page 1 of 2 Visit Report for 07/15/2022 HBO Details Patient Name: Date of Service: [name], [name] 07/15/2022 8:00 [name] Medical Record Number: [id number] Patient Account Number: [id number] Date of Birth/Sex: Treating RN: 1955-10-22 (67 y.o. [name] Primary Care Kilee Hedding: [name] Other Clinician: Valeria Batman Referring Tunis Gentle: Treating Kemontae Dunklee/Extender: [name], [name] in Treatment: 13 HBO Treatment Course Details Treatment Course Number: 1 Ordering Tayvia Faughnan: [name] T Treatments Ordered: otal 40 HBO Treatment Start Date: 06/01/2022 HBO Indication: Diabetic Ulcer(s) of the Lower Extremity Standard/Conservative Wound Care tried and failed greater than or equal to 30 days Wound #4 Left, Dorsal Foot , Wound #5 Left, Lateral Foot HBO Treatment Details Treatment Number: 26 Patient Type: Outpatient Chamber Type: Monoplace Chamber Serial #: [id number] Treatment Protocol: 2.5 ATA with 90 minutes oxygen, with two 5 minute air breaks Treatment Details Compression Rate Down: 2.0 psi / minute De-Compression Rate Up: 2.0 psi / minute [name] breaks and breathing ir Compress Tx Pressure periods Decompress Decompress Begins Reached (leave unused spaces Begins Ends blank) Chamber Pressure (ATA 1 2.5 2.5 2.5 2.5 2.5 - - 2.5 1 ) Clock Time (24 hr) 08:57 09:13 09:43 09:48 10:18 10:23 - - 10:53 11:01 Treatment Length: 124 (minutes) Treatment Segments: 4 Vital Signs Capillary Blood Glucose Reference Range: 80 - 120 mg / dl HBO Diabetic Blood Glucose Intervention Range: <131 mg/dl or >249 mg/dl Type: Time Vitals Blood Pulse: Respiratory Temperature: Capillary Blood Glucose Pulse Action Taken: Pressure: Rate: Glucose (mg/dl): Meter #: Oximetry (%) Taken: Pre 08:35 127/67 94 18 97.4 334 Dr. Celine [name] informed of blood sugar Post 01:11 156/74 74 18 98.1 261 Treatment  Response Treatment Toleration: Well Treatment Completion Status: Treatment Completed without Adverse Event Treatment Notes The patient used Afrin before treatment today. Additional Procedure Documentation Tissue Sevierity: Necrosis of bone Electronic Signature(s) Signed: 07/15/2022 1:56:06 PM By: Valeria Batman EMT Signed: 07/15/2022 4:42:12 PM By: Worthy Keeler PA-C Previous Signature: 07/15/2022 1:55:40 PM Version By: Valeria Batman EMT Entered By: Valeria Batman on 07/15/2022 13:56:05 [name] ([id number]) [id number] Page 2 of 2 -------------------------------------------------------------------------------- HBO Safety Checklist Details Patient Name: Date of Service: [name], [name] 07/15/2022 8:00 [name] Medical Record Number: [id number] Patient Account Number: [id number] Date of Birth/Sex: Treating RN: [birth year] (67 y.o. [name] Primary Care Tinie Mcgloin: [name] Other Clinician: Valeria Batman Referring Alejandra Barna: Treating Jazel Nimmons/Extender: [name], [name] in Treatment: 13 HBO Safety Checklist Items Safety Checklist Consent Form Signed Patient voided / foley secured and emptied When did you last eato 0730 Last dose of injectable or oral agent 0630 Ostomy pouch emptied and vented if applicable NA All implantable devices assessed, documented and approved NA Intravenous access site secured and place NA Valuables secured Linens and cotton and cotton/polyester blend (less than 51% polyester) Personal oil-based products / skin lotions / body lotions removed Wigs or hairpieces removed NA Smoking or tobacco materials removed Books / newspapers / magazines / loose paper removed Cologne, aftershave, perfume and deodorant removed Jewelry removed (may wrap wedding band) Make-up removed NA Hair care products removed Battery operated devices (external) removed Heating patches and chemical warmers  removed Titanium eyewear removed NA Nail polish cured greater than 10 hours NA Casting material cured greater than 10 hours NA Hearing aids removed NA Loose dentures or partials removed removed by patient Prosthetics have been removed Patient demonstrates correct use of air break device (if applicable) Patient  concerns have been addressed Patient grounding bracelet on and cord attached to chamber Specifics for Inpatients (complete in addition to above) Medication sheet sent with patient NA Intravenous medications needed or due during therapy sent with patient NA Drainage tubes (e.g. nasogastric tube or chest tube secured and vented) NA Endotracheal or Tracheotomy tube secured NA Cuff deflated of air and inflated with saline NA Airway suctioned NA Notes The safety checklist was done before the treatment was started. Patient stated that he had an egg and cheese on bagel with coffee and sugar. Electronic Signature(s) Signed: 07/15/2022 1:54:26 PM By: Valeria Batman EMT Entered By: Valeria Batman on 07/15/2022 13:54:25

## 2022-07-15 NOTE — Progress Notes (Signed)
KIRSTEN, MCKONE A ([id number]) [id number] Page 1 of 2 Visit Report for 07/15/2022 Arrival Information Details Patient Name: Date of Service: [name], [name] 07/15/2022 8:00 A M Medical Record Number: [id number] Patient Account Number: [id number] Date of Birth/Sex: Treating RN: 29-Jul-1955 (67 y.o. [name] Primary Care Bear Osten: [name] Other Clinician: Valeria Batman Referring Aden Sek: Treating Bela Nyborg/Extender: [name], [name] in Treatment: 13 Visit Information History Since Last Visit All ordered tests and consults were completed: Yes Patient Arrived: Wheel Chair Added or deleted any medications: No Arrival Time: 08:15 Any new allergies or adverse reactions: No Accompanied By: None Had a fall or experienced change in No Transfer Assistance: EasyPivot Patient Lift activities of daily living that may affect Patient Identification Verified: Yes risk of falls: Secondary Verification Process Completed: Yes Signs or symptoms of abuse/neglect since last visito No Patient Requires Transmission-Based Precautions: No Hospitalized since last visit: No Patient Has Alerts: Yes Implantable device outside of the clinic excluding No Patient Alerts: ESBL;MRSA cellular tissue based products placed in the center since last visit: Pain Present Now: No Electronic Signature(s) Signed: 07/15/2022 1:51:39 PM By: Valeria Batman EMT Entered By: Valeria Batman on 07/15/2022 13:51:39 -------------------------------------------------------------------------------- Encounter Discharge Information Details Patient Name: Date of Service: [name], [name] 07/15/2022 8:00 A M Medical Record Number: [id number] Patient Account Number: [id number] Date of Birth/Sex: Treating RN: Nov 26, 1955 (67 y.o. [name] Primary Care Stuart Guillen: [name] Other Clinician: Valeria Batman Referring Kiffany Schelling: Treating Megin Consalvo/Extender:  [name], [name] in Treatment: 13 Encounter Discharge Information Items Discharge Condition: Stable Ambulatory Status: Wheelchair Discharge Destination: Home Transportation: Private Auto Accompanied By: None Schedule Follow-up Appointment: Yes Clinical Summary of Care: Electronic Signature(s) Signed: 07/15/2022 1:57:07 PM By: Valeria Batman EMT Entered By: Valeria Batman on 07/15/2022 13:57:07 [name] ([id number]) [id number] Page 2 of 2 -------------------------------------------------------------------------------- Vitals Details Patient Name: Date of Service: [name], [name] 07/15/2022 8:00 A M Medical Record Number: [id number] Patient Account Number: [id number] Date of Birth/Sex: Treating RN: 07-01-1956 (67 y.o. [name] Primary Care Sevin Langenbach: [name] Other Clinician: Valeria Batman Referring Zaynab Chipman: Treating Elliott Quade/Extender: [name], [name] in Treatment: 13 Vital Signs Time Taken: 08:35 Temperature (F): 97.4 Height (in): 72 Pulse (bpm): 94 Weight (lbs): 220 Respiratory Rate (breaths/min): 18 Body Mass Index (BMI): 29.8 Blood Pressure (mmHg): 127/67 Capillary Blood Glucose (mg/dl): 334 Reference Range: 80 - 120 mg / dl Electronic Signature(s) Signed: 07/15/2022 1:52:40 PM By: Valeria Batman EMT Entered By: Valeria Batman on 07/15/2022 13:52:39

## 2022-07-16 ENCOUNTER — Encounter (HOSPITAL_BASED_OUTPATIENT_CLINIC_OR_DEPARTMENT_OTHER): Payer: Medicare HMO | Admitting: Internal Medicine

## 2022-07-16 DIAGNOSIS — L97522 Non-pressure chronic ulcer of other part of left foot with fat layer exposed: Secondary | ICD-10-CM | POA: Diagnosis not present

## 2022-07-16 DIAGNOSIS — E11621 Type 2 diabetes mellitus with foot ulcer: Secondary | ICD-10-CM

## 2022-07-16 DIAGNOSIS — L97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 specified severity: Secondary | ICD-10-CM

## 2022-07-16 DIAGNOSIS — M86172 Other acute osteomyelitis, left ankle and foot: Secondary | ICD-10-CM | POA: Diagnosis not present

## 2022-07-16 DIAGNOSIS — L97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 severity: Secondary | ICD-10-CM

## 2022-07-16 DIAGNOSIS — S91301A Unspecified open wound, right foot, initial encounter: Secondary | ICD-10-CM | POA: Diagnosis not present

## 2022-07-16 LAB — GLUCOSE, CAPILLARY
Glucose-Capillary: 233 mg/dL — ABNORMAL HIGH (ref 70–99)
Glucose-Capillary: 139 mg/dL — ABNORMAL HIGH (ref 70–99)

## 2022-07-16 NOTE — Progress Notes (Addendum)
[name] [name] ([id number]) [id number] Page 1 of 2 Visit Report for 07/16/2022 HBO Details Patient Name: Date of Service: [name], [name]. 07/16/2022 8:00 [name] Medical Record Number: [id number] Patient Account Number: [id number] Date of Birth/Sex: Treating RN: 06/17/1956 (67 y.o. [name], [name] Primary Care [name]: [name] Other Clinician: [name] Referring [name]: Treating [name]/Extender: [name] in Treatment: 13 HBO Treatment Course Details Treatment Course Number: 1 Ordering [name]: [name] T Treatments Ordered: otal 40 HBO Treatment Start Date: 06/01/2022 HBO Indication: Diabetic Ulcer(s) of the Lower Extremity Standard/Conservative Wound Care tried and failed greater than or equal to 30 days Wound #4 Left, Dorsal Foot , Wound #5 Left, Lateral Foot HBO Treatment Details Treatment Number: 27 Patient Type: Outpatient Chamber Type: Monoplace Chamber Serial #: [id number] Treatment Protocol: 2.5 ATA with 90 minutes oxygen, with two 5 minute air breaks Treatment Details Compression Rate Down: 2.0 psi / minute De-Compression Rate Up: 2.0 psi / minute [name] breaks and breathing ir Compress Tx Pressure periods Decompress Decompress Begins Reached (leave unused spaces Begins Ends blank) Chamber Pressure (ATA 1 2.5 2.5 2.5 2.5 2.5 - - 2.5 1 ) Clock Time (24 hr) 08:41 08:54 09:24 09:29 09:59 10:04 - - 10:34 10:46 Treatment Length: 125 (minutes) Treatment Segments: 4 Vital Signs Capillary Blood Glucose Reference Range: 80 - 120 mg / dl HBO Diabetic Blood Glucose Intervention Range: <131 mg/dl or >249 mg/dl Time Vitals Blood Respiratory Capillary Blood Glucose Pulse Action Type: Pulse: Temperature: Taken: Pressure: Rate: Glucose (mg/dl): Meter #: Oximetry (%) Taken: Pre 08:15 124/81 90 18 97.7 233 Post 10:47 137/97 81 18 97.9 139 Treatment Response Treatment Toleration: Well Treatment  Completion Status: Treatment Completed without Adverse Event Treatment Notes Patient took Afrin before treatment today. Additional Procedure Documentation Tissue Sevierity: Necrosis of bone Physician HBO Attestation: I certify that I supervised this HBO treatment in accordance with Medicare guidelines. [name] trained emergency response team is readily available per Yes hospital policies and procedures. Continue HBOT as ordered. Yes Electronic Signature(s) Signed: 07/16/2022 3:44:18 PM By: [name] Previous Signature: 07/16/2022 1:57:59 PM Version By: [name] Previous Signature: 07/16/2022 1:57:25 PM Version By: [name] Entered By: [name] on 07/16/2022 15:43:43 [name] ([id number]) [id number] Page 2 of 2 -------------------------------------------------------------------------------- HBO Safety Checklist Details Patient Name: Date of Service: [name], [name]. 07/16/2022 8:00 [name] Medical Record Number: [id number] Patient Account Number: [id number] Date of Birth/Sex: Treating RN: [birth year] (67 y.o. [name], [name] Primary Care [name]: [name] Other Clinician: [name] Referring [name]: Treating [name]/Extender: [name] in Treatment: 13 HBO Safety Checklist Items Safety Checklist Consent Form Signed Patient voided / foley secured and emptied When did you last eato 0730 Last dose of injectable or oral agent 0635 Ostomy pouch emptied and vented if applicable NA All implantable devices assessed, documented and approved Tympanostomy tubes Intravenous access site secured and place NA Valuables secured Linens and cotton and cotton/polyester blend (less than 51% polyester) Personal oil-based products / skin lotions / body lotions removed Wigs or hairpieces removed NA Smoking or tobacco materials removed Books / newspapers / magazines / loose paper removed Cologne,  aftershave, perfume and deodorant removed Jewelry removed (may wrap wedding band) Make-up removed NA Hair care products removed Battery operated devices (external) removed Heating patches and chemical warmers removed Titanium eyewear removed NA Nail polish cured greater than 10 hours NA Casting material cured greater than 10 hours  NA Hearing aids removed NA Loose dentures or partials removed removed by patient Prosthetics have been removed NA Patient demonstrates correct use of air break device (if applicable) Patient concerns have been addressed Patient grounding bracelet on and cord attached to chamber Specifics for Inpatients (complete in addition to above) Medication sheet sent with patient NA Intravenous medications needed or due during therapy sent with patient NA Drainage tubes (e.g. nasogastric tube or chest tube secured and vented) NA Endotracheal or Tracheotomy tube secured NA Cuff deflated of air and inflated with saline NA Airway suctioned Notes The safety checklist was done before the treatment was started. Patient stated that he had an egg, fries and sausage with coffee and toast . Electronic Signature(s) Signed: 07/16/2022 1:55:26 PM By: [name] Entered By: [name] on 07/16/2022 13:55:26

## 2022-07-16 NOTE — Progress Notes (Signed)
CHOICE, KLEINSASSER A ([id number]) [id number] Page 1 of 2 Visit Report for 07/16/2022 Problem List Details Patient Name: Date of Service: [name], [name] 07/16/2022 8:00 A M Medical Record Number: [id number] Patient Account Number: [id number] Date of Birth/Sex: Treating RN: February 06, 1956 (67 y.o. 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/14/2022 No Yes exposed M86.172 Other acute osteomyelitis, left ankle and foot 05/08/2022 No Yes L97.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 04/14/2022 No Yes specified severity E11.621 Type 2 diabetes mellitus with foot ulcer 04/14/2022 No Yes E11.42 Type 2 diabetes mellitus with diabetic polyneuropathy 04/14/2022 No Yes S91.301A Unspecified open wound, right foot, initial encounter 07/16/2022 No Yes 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 07/16/2022 Medical Record Number: [id number] Patient Account Number: [id number] Date of Birth/Sex: Treating RN: Mar 22, 1956 (67 y.o. 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 specified severity E11.621 Type 2 diabetes mellitus with foot ulcer E11.42 Type 2 diabetes mellitus with diabetic polyneuropathy S91.301A Unspecified open wound, right foot, initial encounter 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 severity Facility Procedures CPT4 Code Description Modifier Quantity [id number] G0277-(Facility Use Only) HBOT full body chamber, 86mn , 4 ICD-10 Diagnosis Description E11.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 M86.172 Other acute osteomyelitis, left ankle and foot Physician Procedures Quantity CPT4 Code Description Modifier [phone] - WC PHYS HYPERBARIC OXYGEN THERAPY 1 ICD-10 Diagnosis Description E11.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

## 2022-07-16 NOTE — Progress Notes (Signed)
[name], [name] ([id number]) [id number] Page 1 of 2 Visit Report for 07/15/2022 Problem List Details Patient Name: Date of Service: [name], [name] 07/15/2022 8:00 A M Medical Record Number: [id number] Patient Account Number: [id number] Date of Birth/Sex: Treating RN: 01-14-1956 (67 y.o. [name] Primary Care Provider: [name] Other Clinician: Valeria Batman Referring Provider: Treating Provider/Extender: [name], [name] in Treatment: 13 Active Problems ICD-10 Encounter Code Description Active Date MDM Diagnosis [phone] Non-pressure chronic ulcer of other part of left foot with fat 04/14/2022 No Yes layer exposed M86.172 Other acute osteomyelitis, left ankle and foot 05/08/2022 No Yes L97.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 04/14/2022 No Yes specified severity E11.621 Type 2 diabetes mellitus with foot ulcer 04/14/2022 No Yes E11.42 Type 2 diabetes mellitus with diabetic polyneuropathy 04/14/2022 No Yes Inactive Problems Resolved Problems Electronic Signature(s) Signed: 07/15/2022 1:56:39 PM By: Valeria Batman EMT Signed: 07/15/2022 4:42:12 PM By: Worthy Keeler PA-C Entered By: Valeria Batman on 07/15/2022 13:56:39 -------------------------------------------------------------------------------- SuperBill Details Patient Name: Date of Service: [name], [name] 07/15/2022 Medical Record Number: [id number] Patient Account Number: [id number] Date of Birth/Sex: Treating RN: [birth year] (67 y.o. [name] Primary Care Provider: [name] Other Clinician: Valeria Batman Referring Provider: Treating Provider/Extender: [name], [name] in Treatment: [address] 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 specified severity E11.621 Type 2 diabetes mellitus with foot ulcer E11.42 Type 2 diabetes mellitus with diabetic polyneuropathy Facility Procedures : CPT4 Code Description: [id number] G0277-(Facility Use Only) HBOT full body chamber, 75mn , ICD-10 Diagnosis Description E11.621 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.522 Non-pressure chronic ulcer of other part of left foot with f L97.528  Non-pressure chronic ulcer of other part of left foot with o M86.172 Other acute osteomyelitis, left ankle and foot Modifier: at layer exposed ther specified se Quantity: 4 verity Physician Procedures : CPT4 Code Description Modifier [id number] WC PHYS HYPERBARIC OXYGEN THERAPY ICD-10 Diagnosis Description E11.621 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.528  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

## 2022-07-16 NOTE — Progress Notes (Signed)
[name], [name] ([id number]) [id number] Page 1 of 2 Visit Report for 07/14/2022 Problem List Details Patient Name: Date of Service: [name], [name] 07/14/2022 11:30 A M Medical Record Number: [id number] Patient Account Number: [id number] Date of Birth/Sex: Treating RN: 03-13-1956 (67 y.o. [name], [name] Primary Care Provider: [name] Other Clinician: Valeria Batman Referring Provider: Treating Provider/Extender: [name] in Treatment: 13 Active Problems ICD-10 Encounter Code Description Active Date MDM Diagnosis [phone] Non-pressure chronic ulcer of other part of left foot with fat 04/14/2022 No Yes layer exposed M86.172 Other acute osteomyelitis, left ankle and foot 05/08/2022 No Yes L97.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 04/14/2022 No Yes specified severity E11.621 Type 2 diabetes mellitus with foot ulcer 04/14/2022 No Yes E11.42 Type 2 diabetes mellitus with diabetic polyneuropathy 04/14/2022 No Yes Inactive Problems Resolved Problems Electronic Signature(s) Signed: 07/14/2022 2:52:47 PM By: Valeria Batman EMT Signed: 07/16/2022 9:59:16 AM By: [name] DO Entered By: Valeria Batman on 07/14/2022 14:52:47 -------------------------------------------------------------------------------- SuperBill Details Patient Name: Date of Service: [name], [name] 07/14/2022 Medical Record Number: [id number] Patient Account Number: [id number] Date of Birth/Sex: Treating RN: 1956-05-24 (67 y.o. [name] Primary Care Provider: [name] Other Clinician: Valeria Batman Referring Provider: Treating Provider/Extender: [name] in Treatment: [address] 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 specified severity E11.621 Type 2 diabetes mellitus with foot ulcer E11.42 Type 2 diabetes mellitus with diabetic polyneuropathy Facility Procedures : CPT4 Code Description: [id number] G0277-(Facility Use Only) HBOT full body chamber, 65mn , ICD-10 Diagnosis Description E11.621 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.522 Non-pressure chronic ulcer of other part of left foot with f L97.528  Non-pressure chronic ulcer of other part of left foot with o M86.172 Other acute osteomyelitis, left ankle and foot Modifier: at layer exposed ther specified se Quantity: 4 verity Physician Procedures : CPT4 Code Description Modifier [phone] WC PHYS HYPERBARIC OXYGEN THERAPY ICD-10 Diagnosis Description E11.621 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

## 2022-07-16 NOTE — Progress Notes (Signed)
[name], [name] ([id number]) [id number] Page 1 of 2 Visit Report for 07/16/2022 Arrival Information Details Patient Name: Date of Service: [name], [name] [name]. 07/16/2022 8:00 [name] Medical Record Number: [id number] Patient Account Number: [id number] Date of Birth/Sex: Treating RN: 11/20/1955 (67 y.o. [name], [name] Primary Care Terez Montee: [name] Other Clinician: Valeria Batman Referring Aquiles Ruffini: Treating Jerime Arif/Extender: [name] in Treatment: 13 Visit Information History Since Last Visit All ordered tests and consults were completed: Yes Patient Arrived: Wheel Chair Added or deleted any medications: No Arrival Time: 08:10 Any new allergies or adverse reactions: No Accompanied By: None Had [name] fall or experienced change in No Transfer Assistance: EasyPivot Patient Lift activities of daily living that may affect Patient Identification Verified: Yes risk of falls: Secondary Verification Process Completed: Yes Signs or symptoms of abuse/neglect since last visito No Patient Requires Transmission-Based Precautions: No Hospitalized since last visit: No Patient Has Alerts: Yes Implantable device outside of the clinic excluding No Patient Alerts: ESBL;MRSA cellular tissue based products placed in the center since last visit: Pain Present Now: No Electronic Signature(s) Signed: 07/16/2022 1:51:34 PM By: Valeria Batman EMT Entered By: Valeria Batman on 07/16/2022 13:51:34 -------------------------------------------------------------------------------- Encounter Discharge Information Details Patient Name: Date of Service: [name], NA [name]. 07/16/2022 8:00 [name] Medical Record Number: [id number] Patient Account Number: [id number] Date of Birth/Sex: Treating RN: [birth year] (67 y.o. [name], [name] Primary Care Buren Havey: [name] Other Clinician: Valeria Batman Referring Dshaun Reppucci: Treating Jya Hughston/Extender: [name] in Treatment: 13 Encounter Discharge Information Items Discharge Condition: Stable Ambulatory Status: Wheelchair Discharge Destination: Home Transportation: Private Auto Accompanied By: None Schedule Follow-up Appointment: Yes Clinical Summary of Care: Electronic Signature(s) Signed: 07/16/2022 2:06:04 PM By: Valeria Batman EMT Entered By: Valeria Batman on 07/16/2022 14:06:04 [name] ([id number]) [id number] Page 2 of 2 -------------------------------------------------------------------------------- Vitals Details Patient Name: Date of Service: [name], [name] [name]. 07/16/2022 8:00 [name] Medical Record Number: [id number] Patient Account Number: [id number] Date of Birth/Sex: Treating RN: 1955-11-17 (67 y.o. [name], [name] Primary Care Cruze Zingaro: [name] Other Clinician: Valeria Batman Referring Rain Friedt: Treating Joslin Doell/Extender: [name] in Treatment: 13 Vital Signs Time Taken: 08:15 Temperature (F): 97.7 Height (in): 72 Pulse (bpm): 90 Weight (lbs): 220 Respiratory Rate (breaths/min): 18 Body Mass Index (BMI): 29.8 Blood Pressure (mmHg): 124/81 Capillary Blood Glucose (mg/dl): 233 Reference Range: 80 - 120 mg / dl Electronic Signature(s) Signed: 07/16/2022 1:52:16 PM By: Valeria Batman EMT Entered By: Valeria Batman on 07/16/2022 13:52:16

## 2022-07-17 ENCOUNTER — Encounter (HOSPITAL_BASED_OUTPATIENT_CLINIC_OR_DEPARTMENT_OTHER): Payer: Medicare HMO | Admitting: Internal Medicine

## 2022-07-17 DIAGNOSIS — L97522 Non-pressure chronic ulcer of other part of left foot with fat layer exposed: Secondary | ICD-10-CM | POA: Diagnosis not present

## 2022-07-17 DIAGNOSIS — L97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 specified severity: Secondary | ICD-10-CM | POA: Diagnosis not present

## 2022-07-17 DIAGNOSIS — E11621 Type 2 diabetes mellitus with foot ulcer: Secondary | ICD-10-CM

## 2022-07-17 DIAGNOSIS — M86172 Other acute osteomyelitis, left ankle and foot: Secondary | ICD-10-CM | POA: Diagnosis not present

## 2022-07-17 LAB — GLUCOSE, CAPILLARY
Glucose-Capillary: 434 mg/dL — ABNORMAL HIGH (ref 70–99)
Glucose-Capillary: 428 mg/dL — ABNORMAL HIGH (ref 70–99)
Glucose-Capillary: 315 mg/dL — ABNORMAL HIGH (ref 70–99)

## 2022-07-17 NOTE — Progress Notes (Signed)
AMIT, LEECE A ([id number]) [id number] Page 1 of 2 Visit Report for 07/17/2022 HBO Details Patient Name: Date of Service: [name], [name] A. 07/17/2022 9:30 A M Medical Record Number: [id number] Patient Account Number: [id number] Date of Birth/Sex: Treating RN: 08/20/1955 (67 y.o. [name] Primary Care Tanasha Menees: [name] Other Clinician: [name] Referring Romone Shaff: Treating Tola Meas/Extender: [name] in Treatment: 13 HBO Treatment Course Details Treatment Course Number: 1 Ordering Jarica Plass: [name] T Treatments Ordered: otal 40 HBO Treatment Start Date: 06/01/2022 HBO Indication: Diabetic Ulcer(s) of the Lower Extremity Standard/Conservative Wound Care tried and failed greater than or equal to 30 days Wound #4 Left, Dorsal Foot , Wound #5 Left, Lateral Foot HBO Treatment Details Treatment Number: 28 Patient Type: Outpatient Chamber Type: Monoplace Chamber Serial #: [id number] Treatment Protocol: 2.5 ATA with 90 minutes oxygen, with two 5 minute air breaks Treatment Details Compression Rate Down: 1.5 psi / minute De-Compression Rate Up: Compress Tx Pressure A breaks and breathing periods Decompress Decompress ir Begins Reached (leave unused spaces blank) Begins Ends Chamber Pressure (ATA 1 2.5 2.5 2.5 2.5 2.5 - - 2.5 1 ) Clock Time (24 hr) 09:10 09:24 09:54 09:59 10:17 10:24 10:29 10:37 11:14 11:30 Treatment Length: 140 (minutes) Treatment Segments: 5 Vital Signs Capillary Blood Glucose Reference Range: 80 - 120 mg / dl HBO Diabetic Blood Glucose Intervention Range: <131 mg/dl or >249 mg/dl Type: Time Vitals Blood Pulse: Respiratory Temperature: Capillary Blood Glucose Pulse Action Taken: Pressure: Rate: Glucose (mg/dl): Meter #: Oximetry (%) Taken: Pre 08:38 148/85 91 18 97.9 434 1 physician informed Post 11:33 156/78 78 18 97.6 315 1 patient asymptomatic for hyperglycemia Pre 09:07  428 1 Cleared for HBO treatment. Treatment Response Treatment Toleration: Well Treatment Completion Status: Treatment Completed without Adverse Event Treatment Notes Patient arrived, blood glucose was measured at 434 mg/dL. Patient states he ate 3 [place] sausages, 2 pieces of toast, oatmeal, and black coffee this morning. Informed Dr. Heber [place] of patient's symptoms of thirst and vitals. Patient was cleared for treatment. Patient prepared for treatment and was placed in the chamber after safety check was accomplished. Patient travelled at 1 psi/min until reaching 4.5 psig and he verified that he was not having issues with ear equalization and/or pain. Rate set was increased to 2 psi/min. Patient tolerated treatment until a few minutes prior to air break when he had to use the restroom and patient did not have a urinal. Chamber was decompressed at 2.5 psi/min until reaching 1 ATA. Patient did not leave bedside. Patient placed back in the chamber and travelled at 2.5 psi/min until reaching treatment depth which he tolerated fine. Decompression of chamber was at 2.5 psi/min upon conclusion of treatment and patient stated he felt fine, dressed for discharge and was stable upon discharge. Additional Procedure Documentation Tissue Sevierity: Necrosis of bone Physician HBO Attestation: I certify that I supervised this HBO treatment in accordance with Medicare guidelines. A trained emergency response team is readily available per Yes hospital policies and procedures. Continue HBOT as ordered. Yes Electronic Signature(s) [name], [name] ([id number]) [id number] Page 2 of 2 Signed: 07/21/2022 10:54:15 AM By: [name] DO Previous Signature: 07/17/2022 12:21:24 PM Version By: [name] CHT EMT BS , , Previous Signature: 07/17/2022 12:19:24 PM Version By: [name] CHT EMT BS , , Previous Signature: 07/17/2022 12:10:29 PM Version By: [name] CHT EMT  BS , , Entered By: [name] on 07/21/2022 10:53:37 -------------------------------------------------------------------------------- HBO Safety Checklist Details Patient  Name: Date of Service: [name], [name] A. 07/17/2022 9:30 A M Medical Record Number: [id number] Patient Account Number: [id number] Date of Birth/Sex: Treating RN: [birth year] (67 y.o. [name] Primary Care Dezi Schaner: [name] Other Clinician: [name] Referring Orissa Arreaga: Treating Shifa Brisbon/Extender: [name] in Treatment: 13 HBO Safety Checklist Items Safety Checklist Consent Form Signed Patient voided / foley secured and emptied When did you last eato 0730 Last dose of injectable or oral agent Yesterday Ostomy pouch emptied and vented if applicable NA All implantable devices assessed, documented and approved NA Intravenous access site secured and place NA Valuables secured Linens and cotton and cotton/polyester blend (less than 51% polyester) Personal oil-based products / skin lotions / body lotions removed Wigs or hairpieces removed NA Smoking or tobacco materials removed NA Books / newspapers / magazines / loose paper removed Cologne, aftershave, perfume and deodorant removed Jewelry removed (may wrap wedding band) Make-up removed NA Hair care products removed Battery operated devices (external) removed Heating patches and chemical warmers removed Titanium eyewear removed NA Nail polish cured greater than 10 hours NA Casting material cured greater than 10 hours NA Hearing aids removed NA Loose dentures or partials removed NA Prosthetics have been removed NA Patient demonstrates correct use of air break device (if applicable) Patient concerns have been addressed Patient grounding bracelet on and cord attached to chamber Specifics for Inpatients (complete in addition to above) Medication sheet sent with patient NA Intravenous  medications needed or due during therapy sent with patient NA Drainage tubes (e.g. nasogastric tube or chest tube secured and vented) NA Endotracheal or Tracheotomy tube secured NA Cuff deflated of air and inflated with saline NA Airway suctioned NA Notes Paper version used prior to treatment. Electronic Signature(s) Signed: 07/17/2022 12:04:25 PM By: [name] CHT EMT BS , , Entered By: [name] on 07/17/2022 12:04:25

## 2022-07-17 NOTE — Progress Notes (Signed)
[name], GRANDSTAFF A ([id number]) [id number] Page 1 of 2 Visit Report for 07/17/2022 Arrival Information Details Patient Name: Date of Service: [name], [name] 07/17/2022 9:30 A M Medical Record Number: [id number] Patient Account Number: [id number] Date of Birth/Sex: Treating RN: 1955/07/20 (67 y.o. [name] Primary Care Kaimana Neuzil: [name] Other Clinician: [name] Referring Autumne Kallio: Treating Jalisha Enneking/Extender: [name] in Treatment: 61 Visit Information History Since Last Visit All ordered tests and consults were completed: Yes Patient Arrived: Kasandra Knudsen Added or deleted any medications: No Arrival Time: 09:30 Any new allergies or adverse reactions: No Accompanied By: self Had a fall or experienced change in No Transfer Assistance: None activities of daily living that may affect Patient Identification Verified: Yes risk of falls: Secondary Verification Process Completed: Yes Signs or symptoms of abuse/neglect since last visito No Patient Requires Transmission-Based Precautions: No Hospitalized since last visit: No Patient Has Alerts: Yes Implantable device outside of the clinic excluding No Patient Alerts: ESBL;MRSA cellular tissue based products placed in the center since last visit: Pain Present Now: No Electronic Signature(s) Signed: 07/17/2022 11:58:12 AM By: [name] CHT EMT BS , , Entered By: [name] on 07/17/2022 11:58:11 -------------------------------------------------------------------------------- Encounter Discharge Information Details Patient Name: Date of Service: [name], [name] 07/17/2022 9:30 A M Medical Record Number: [id number] Patient Account Number: [id number] Date of Birth/Sex: Treating RN: [birth year] (67 y.o. [name] Primary Care Dondrea Clendenin: [name] Other Clinician: [name] Referring Arlen Legendre: Treating  Roshawnda Pecora/Extender: [name] in Treatment: 13 Encounter Discharge Information Items Discharge Condition: Stable Ambulatory Status: Wheelchair Discharge Destination: Home Transportation: Private Auto Accompanied By: self Schedule Follow-up Appointment: No Clinical Summary of Care: Electronic Signature(s) Signed: 07/17/2022 12:11:46 PM By: [name] CHT EMT BS , , Entered By: [name] on 07/17/2022 12:11:46 [name] ([id number]) [id number].pdf Page 2 of 2 -------------------------------------------------------------------------------- Vitals Details Patient Name: Date of Service: [name], [name] 07/17/2022 9:30 A M Medical Record Number: [id number] Patient Account Number: [id number] Date of Birth/Sex: Treating RN: 08-13-1955 (67 y.o. [name] Primary Care Eddy Termine: [name] Other Clinician: [name] Referring Cayleigh Paull: Treating Chassie Pennix/Extender: [name] in Treatment: 13 Vital Signs Time Taken: 08:38 Temperature (F): 97.9 Height (in): 72 Pulse (bpm): 91 Weight (lbs): 220 Respiratory Rate (breaths/min): 18 Body Mass Index (BMI): 29.8 Blood Pressure (mmHg): 148/85 Capillary Blood Glucose (mg/dl): 434 Reference Range: 80 - 120 mg / dl Electronic Signature(s) Signed: 07/17/2022 12:01:11 PM By: [name] CHT EMT BS , , Entered By: [name] on 07/17/2022 12:01:10

## 2022-07-20 ENCOUNTER — Encounter (HOSPITAL_BASED_OUTPATIENT_CLINIC_OR_DEPARTMENT_OTHER): Payer: Medicare HMO | Admitting: Internal Medicine

## 2022-07-20 DIAGNOSIS — E11621 Type 2 diabetes mellitus with foot ulcer: Secondary | ICD-10-CM | POA: Diagnosis not present

## 2022-07-20 LAB — GLUCOSE, CAPILLARY
Glucose-Capillary: 259 mg/dL — ABNORMAL HIGH (ref 70–99)
Glucose-Capillary: 255 mg/dL — ABNORMAL HIGH (ref 70–99)

## 2022-07-20 NOTE — Progress Notes (Addendum)
[name] ([id number]) [id number] Page 1 of 2 Visit Report for 07/20/2022 Arrival Information Details Patient Name: Date of Service: [name], [name] [name]. 07/20/2022 8:00 [name] M Medical Record Number: [id number] Patient Account Number: [id number] Date of Birth/Sex: Treating RN: [birth year] (67 y.o. [name], [name] Primary Care Joanne Salah: [name] Other Clinician: Valeria Batman Referring Aaralyn Kil: Treating Una Yeomans/Extender: Lelon Frohlich, [name] in Treatment: 54 Visit Information History Since Last Visit All ordered tests and consults were completed: Yes Patient Arrived: Wheel Chair Added or deleted any medications: No Arrival Time: 08:07 Any new allergies or adverse reactions: No Accompanied By: None Had [name] fall or experienced change in No Transfer Assistance: None activities of daily living that may affect Patient Identification Verified: Yes risk of falls: Secondary Verification Process Completed: Yes Signs or symptoms of abuse/neglect since last visito No Patient Requires Transmission-Based Precautions: No Hospitalized since last visit: No Patient Has Alerts: Yes Implantable device outside of the clinic excluding No Patient Alerts: ESBL;MRSA cellular tissue based products placed in the center since last visit: Pain Present Now: No Electronic Signature(s) Signed: 07/20/2022 1:03:26 PM By: Valeria Batman EMT Entered By: Valeria Batman on 07/20/2022 13:03:26 -------------------------------------------------------------------------------- Encounter Discharge Information Details Patient Name: Date of Service: [name], NA [name] [name]. 07/20/2022 8:00 [name] M Medical Record Number: [id number] Patient Account Number: [id number] Date of Birth/Sex: Treating RN: 12/16/1955 (67 y.o. [name], [name] Primary Care Shelitha Magley: [name] Other Clinician: Valeria Batman Referring Geraldyn Shain: Treating Adelei Scobey/Extender: Lelon Frohlich, [name] in Treatment: 13 Encounter Discharge Information Items Discharge Condition: Stable Ambulatory Status: Wheelchair Discharge Destination: Home Transportation: Private Auto Accompanied By: None Schedule Follow-up Appointment: Yes Clinical Summary of Care: Electronic Signature(s) Signed: 07/20/2022 1:11:38 PM By: Valeria Batman EMT Entered By: Valeria Batman on 07/20/2022 13:11:38 [name] ([id number]) [id number] Page 2 of 2 -------------------------------------------------------------------------------- Vitals Details Patient Name: Date of Service: [name], [name] [name]. 07/20/2022 8:00 [name] M Medical Record Number: [id number] Patient Account Number: [id number] Date of Birth/Sex: Treating RN: 11-26-1955 (67 y.o. [name], [name] Primary Care Dalal Livengood: [name] Other Clinician: Valeria Batman Referring Juston Goheen: Treating Jamie Hafford/Extender: Lelon Frohlich, [name] in Treatment: 13 Vital Signs Time Taken: 08:33 Temperature (F): 98.2 Height (in): 72 Pulse (bpm): 98 Weight (lbs): 220 Respiratory Rate (breaths/min): 16 Body Mass Index (BMI): 29.8 Blood Pressure (mmHg): 135/85 Capillary Blood Glucose (mg/dl): 259 Reference Range: 80 - 120 mg / dl Electronic Signature(s) Signed: 07/20/2022 1:04:00 PM By: Valeria Batman EMT Entered By: Valeria Batman on 07/20/2022 13:04:00

## 2022-07-20 NOTE — Progress Notes (Signed)
CAITLIN, HILLMER A ([id number]) [id number] Page 1 of 2 Visit Report for 07/20/2022 Problem List Details Patient Name: Date of Service: [name], [name] 07/20/2022 8:00 A M Medical Record Number: [id number] Patient Account Number: [id number] Date of Birth/Sex: Treating RN: 05/22/1956 (67 y.o. 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/14/2022 No Yes exposed M86.172 Other acute osteomyelitis, left ankle and foot 05/08/2022 No Yes L97.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 04/14/2022 No Yes specified severity E11.621 Type 2 diabetes mellitus with foot ulcer 04/14/2022 No Yes E11.42 Type 2 diabetes mellitus with diabetic polyneuropathy 04/14/2022 No Yes S91.301A Unspecified open wound, right foot, initial encounter 07/16/2022 No Yes 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 07/20/2022 Medical Record Number: [id number] Patient Account Number: [id number] Date of Birth/Sex: Treating RN: 10/22/1955 (67 y.o. 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 specified severity E11.621 Type 2 diabetes mellitus with foot ulcer E11.42 Type 2 diabetes mellitus with diabetic polyneuropathy S91.301A Unspecified open wound, right foot, initial encounter 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 severity Facility Procedures CPT4 Code Description Modifier Quantity [id number]-(Facility Use Only) HBOT full body chamber, 24mn , 4 ICD-10 Diagnosis Description E11.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 M86.172 Other acute osteomyelitis, left ankle and foot Physician Procedures Quantity CPT4 Code Description Modifier [id number] - WC PHYS HYPERBARIC OXYGEN THERAPY 1 ICD-10 Diagnosis Description E11.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

## 2022-07-20 NOTE — Progress Notes (Addendum)
[name] [name] ([id number]) [id number] Page 1 of 2 Visit Report for 07/20/2022 HBO Details Patient Name: Date of Service: [name], [name]. 07/20/2022 8:00 [name] Medical Record Number: [id number] Patient Account Number: [id number] Date of Birth/Sex: Treating RN: [birth year] (67 y.o. [name], [name] Primary Care [name]: [name] Other Clinician: [name] Referring [name]: Treating [name]: [name], [name] in Treatment: 13 HBO Treatment Course Details Treatment Course Number: 1 Ordering [name]: [name] T Treatments Ordered: otal 40 HBO Treatment Start Date: 06/01/2022 HBO Indication: Diabetic Ulcer(s) of the Lower Extremity Standard/Conservative Wound Care tried and failed greater than or equal to 30 days Wound #4 Left, Dorsal Foot , Wound #5 Left, Lateral Foot HBO Treatment Details Treatment Number: 29 Patient Type: Outpatient Chamber Type: Monoplace Chamber Serial #: [id number] Treatment Protocol: 2.5 ATA with 90 minutes oxygen, with two 5 minute air breaks Treatment Details Compression Rate Down: 2.0 psi / minute De-Compression Rate Up: 2.0 psi / minute [name] breaks and breathing ir Compress Tx Pressure periods Decompress Decompress Begins Reached (leave unused spaces Begins Ends blank) Chamber Pressure (ATA 1 2.5 2.5 2.5 2.5 2.5 - - 2.5 1 ) Clock Time (24 hr) 08:51 09:06 09:36 09:41 10:12 10:17 - - 10:47 10:55 Treatment Length: 124 (minutes) Treatment Segments: 4 Vital Signs Capillary Blood Glucose Reference Range: 80 - 120 mg / dl HBO Diabetic Blood Glucose Intervention Range: <131 mg/dl or >249 mg/dl Time Vitals Blood Respiratory Capillary Blood Glucose Pulse Action Type: Pulse: Temperature: Taken: Pressure: Rate: Glucose (mg/dl): Meter #: Oximetry (%) Taken: Pre 08:33 135/85 98 16 98.2 259 Post 11:05 124/87 88 16 98.1 255 Treatment Response Treatment Toleration: Well Treatment  Completion Status: Treatment Completed without Adverse Event Treatment Notes The patient stated that he had 2 eggs, oatmeal, beef hash, toast, coffee this morning. The patient used Afrin before treatment. Additional Procedure Documentation Tissue Sevierity: Necrosis of bone Electronic Signature(s) Signed: 07/20/2022 1:10:27 PM By: [name] EMT Signed: 07/20/2022 3:35:21 PM By: [name] Previous Signature: 07/20/2022 1:09:51 PM Version By: [name] EMT Entered By: [name] on 07/20/2022 13:10:26 [name] ([id number]) [id number] Page 2 of 2 -------------------------------------------------------------------------------- HBO Safety Checklist Details Patient Name: Date of Service: [name], [name]. 07/20/2022 8:00 [name] Medical Record Number: [id number] Patient Account Number: [id number] Date of Birth/Sex: Treating RN: 01-30-1956 (67 y.o. [name], [name] Primary Care [name]: [name] Other Clinician: [name] Referring [name]: Treating [name]: [name], [name] in Treatment: 13 HBO Safety Checklist Items Safety Checklist Consent Form Signed Patient voided / foley secured and emptied When did you last eato 0730 Last dose of injectable or oral agent 0650 Ostomy pouch emptied and vented if applicable NA All implantable devices assessed, documented and approved NA Intravenous access site secured and place NA Valuables secured Linens and cotton and cotton/polyester blend (less than 51% polyester) Personal oil-based products / skin lotions / body lotions removed Wigs or hairpieces removed NA Smoking or tobacco materials removed Books / newspapers / magazines / loose paper removed Cologne, aftershave, perfume and deodorant removed Jewelry removed (may wrap wedding band) Make-up removed NA Hair care products removed Battery operated devices (external) removed Heating patches  and chemical warmers removed Titanium eyewear removed NA Nail polish cured greater than 10 hours NA Casting material cured greater than 10 hours NA Hearing aids removed NA Loose dentures or partials removed removed by patient Prosthetics have been removed NA Patient demonstrates correct use of  air break device (if applicable) Patient concerns have been addressed Patient grounding bracelet on and cord attached to chamber Specifics for Inpatients (complete in addition to above) Medication sheet sent with patient NA Intravenous medications needed or due during therapy sent with patient NA Drainage tubes (e.g. nasogastric tube or chest tube secured and vented) NA Endotracheal or Tracheotomy tube secured NA Cuff deflated of air and inflated with saline NA Airway suctioned NA Notes The safety checklist was done before the treatment was started. Electronic Signature(s) Signed: 07/20/2022 1:06:43 PM By: [name] EMT Entered By: [name] on 07/20/2022 13:06:43

## 2022-07-21 ENCOUNTER — Encounter (HOSPITAL_BASED_OUTPATIENT_CLINIC_OR_DEPARTMENT_OTHER): Payer: Medicare HMO | Admitting: Internal Medicine

## 2022-07-21 DIAGNOSIS — E11621 Type 2 diabetes mellitus with foot ulcer: Secondary | ICD-10-CM | POA: Diagnosis not present

## 2022-07-21 DIAGNOSIS — L97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 specified severity: Secondary | ICD-10-CM

## 2022-07-21 DIAGNOSIS — M86172 Other acute osteomyelitis, left ankle and foot: Secondary | ICD-10-CM

## 2022-07-21 DIAGNOSIS — L97522 Non-pressure chronic ulcer of other part of left foot with fat layer exposed: Secondary | ICD-10-CM

## 2022-07-21 LAB — GLUCOSE, CAPILLARY
Glucose-Capillary: 266 mg/dL — ABNORMAL HIGH (ref 70–99)
Glucose-Capillary: 271 mg/dL — ABNORMAL HIGH (ref 70–99)

## 2022-07-21 NOTE — Progress Notes (Signed)
[name] ([id number]) [id number] Page 1 of 1 Visit Report for 07/21/2022 Arrival Information Details Patient Name: Date of Service: [name], [name]. 07/21/2022 10:00 [name] Medical Record Number: [id number] Patient Account Number: [id number] Date of Birth/Sex: Treating RN: [birth year] (67 y.o. [name] Primary Care Giliana Vantil: [name] Other Clinician: [name] Referring Emmalyn Hinson: Treating Maxen Rowland/Extender: [name] in Treatment: 14 Visit Information History Since Last Visit All ordered tests and consults were completed: Yes Patient Arrived: Wheel Chair Added or deleted any medications: No Arrival Time: 08:24 Any new allergies or adverse reactions: No Accompanied By: self Had [name] fall or experienced change in No Transfer Assistance: None activities of daily living that may affect Patient Identification Verified: Yes risk of falls: Secondary Verification Process Completed: Yes Signs or symptoms of abuse/neglect since last visito No Patient Requires Transmission-Based Precautions: No Hospitalized since last visit: No Patient Has Alerts: Yes Implantable device outside of the clinic excluding No Patient Alerts: ESBL;MRSA cellular tissue based products placed in the center since last visit: Pain Present Now: No Electronic Signature(s) Signed: 07/21/2022 1:07:23 PM By: [name] CHT EMT BS , , Entered By: [name] on 07/21/2022 13:07:22 -------------------------------------------------------------------------------- Vitals Details Patient Name: Date of Service: [name], [name]. 07/21/2022 10:00 [name] Medical Record Number: [id number] Patient Account Number: [id number] Date of Birth/Sex: Treating RN: 1955-12-21 (67 y.o. [name] Primary Care Khayree Delellis: [name] Other Clinician: [name] Referring Kentley Blyden: Treating Verlinda Slotnick/Extender: [name] in Treatment: 14 Vital Signs Time Taken: 08:50 Temperature (F): 98.0 Height (in): 72 Pulse (bpm): 90 Weight (lbs): 220 Respiratory Rate (breaths/min): 18 Body Mass Index (BMI): 29.8 Blood Pressure (mmHg): 146/80 Capillary Blood Glucose (mg/dl): 266 Reference Range: 80 - 120 mg / dl Electronic Signature(s) Signed: 07/21/2022 1:07:50 PM By: [name] CHT EMT BS , , Entered By: [name] on 07/21/2022 13:07:50

## 2022-07-21 NOTE — Progress Notes (Signed)
[name] [name] ([id number]) [id number] Page 1 of 14 Visit Report for 07/16/2022 Chief Complaint Document Details Patient Name: Date of Service: [name] [name], [place] [name] [name]. 07/16/2022 10:15 [name] Medical Record Number: [id number] Patient Account Number: [id number] Date of Birth/Sex: Treating RN: 25-Jul-1955 (67 y.o. M) Primary Care Provider: [name] Other Clinician: Referring Provider: Treating Provider/Extender: [name], [name] in Treatment: 13 Information Obtained from: Patient Chief Complaint 10/16/20; patient is here with [name] substantial wound on his right foot in the setting of [name] recent transmetatarsal amputation. 04/14/2022; referral from podiatry for postsurgical wound of nonhealing partial left fifth ray amputation, right foot (07/16/22): TMA dehiscence Electronic Signature(s) Signed: 07/16/2022 12:04:27 PM By: [name] Shan DO Entered By: [name] [name] on 07/16/2022 11:55:04 -------------------------------------------------------------------------------- Cellular or Tissue Based Product Details Patient Name: Date of Service: [name] [name], NA [name] [name]. 07/16/2022 10:15 [name] Medical Record Number: [id number] Patient Account Number: [id number] Date of Birth/Sex: Treating RN: May 22, 1956 (67 y.o. [name] [name] Primary Care Provider: [name] Other Clinician: Referring Provider: Treating Provider/Extender: [name] in Treatment: 13 Cellular or Tissue Based Product Type Wound #5 Left,Lateral Foot Applied to: Performed By: Physician [name] Shan, DO Cellular or Tissue Based Product Type: Grafix prime Level of Consciousness (Pre-procedure): Awake and Alert Pre-procedure Verification/Time Out Yes - 11:40 Taken: Location: genitalia / hands / feet / multiple digits Wound Size (sq cm): 3.36 Product Size (sq cm): 6 Waste Size (sq cm): 0 Amount of Product Applied (sq cm): 6 Instrument Used:  Forceps Lot #: [id number] Expiration Date: 11/18/2023 Secured: Yes Secured With: Steri-Strips Dressing Applied: Yes Primary Dressing: adaptic, gauze, ABD, compression wrap Procedural Pain: 0 Post Procedural Pain: 0 Response to Treatment: Procedure was tolerated well Level of Consciousness (Post- Awake and Alert procedure): Post Procedure Diagnosis Same as Pre-procedure [name] [name] ([id number]) [id number] Page 2 of 14 Notes Scribed for Dr [name] [name] by [name] Creamer, Rn Electronic Signature(s) Signed: 07/16/2022 12:04:27 PM By: [name] Shan DO Signed: 07/21/2022 3:57:27 PM By: [name] Creamer RN, [name] Entered By: [name] [name] on 07/16/2022 11:49:55 -------------------------------------------------------------------------------- Debridement Details Patient Name: Date of Service: [name] [name], NA [name] [name]. 07/16/2022 10:15 [name] Medical Record Number: [id number] Patient Account Number: [id number] Date of Birth/Sex: Treating RN: 12-04-1955 (67 y.o. [name] [name] Primary Care Provider: [name] Other Clinician: Referring Provider: Treating Provider/Extender: [name] in Treatment: 13 Debridement Performed for Assessment: Wound #6 Right Amputation Site - Transmetatarsal Performed By: Physician [name] Shan, DO Debridement Type: Debridement Severity of Tissue Pre Debridement: Fat layer exposed Level of Consciousness (Pre-procedure): Awake and Alert Pre-procedure Verification/Time Out Yes - 11:35 Taken: Start Time: 11:35 T Area Debrided (L x W): otal 2 (cm) x 0.5 (cm) = 1 (cm) Tissue and other material debrided: Non-Viable, Callus, Slough, Subcutaneous, Slough Level: Skin/Subcutaneous Tissue Debridement Description: Excisional Instrument: Curette Bleeding: Minimum Hemostasis Achieved: Pressure Procedural Pain: 0 Post Procedural Pain: 0 Response to Treatment: Procedure was tolerated well Level of Consciousness  (Post- Awake and Alert procedure): Post Debridement Measurements of Total Wound Length: (cm) 2 Width: (cm) 0.5 Depth: (cm) 1 Volume: (cm) 0.785 Character of Wound/Ulcer Post Debridement: Improved Severity of Tissue Post Debridement: Fat layer exposed Post Procedure Diagnosis Same as Pre-procedure Notes Scribed for Dr [name] [place] by [name] Creamer, Rn Electronic Signature(s) Signed: 07/16/2022 12:04:27 PM By: [name] Shan DO Signed: 07/21/2022 3:57:27 PM By: [name] Creamer RN, [name] Entered By: [name] [name] on 07/16/2022 11:40:08 -------------------------------------------------------------------------------- Debridement Details Patient Name: Date of  Service: [name] [name], NA [name] [name]. 07/16/2022 10:15 [name] [name] [name] ([id number]) [phone].pdf Page 3 of 14 Medical Record Number: [id number] Patient Account Number: [id number] Date of Birth/Sex: Treating RN: 08-19-1955 (67 y.o. [name] [name] Primary Care Provider: [name] Other Clinician: Referring Provider: Treating Provider/Extender: [name] in Treatment: 13 Debridement Performed for Assessment: Wound #5 Left,Lateral Foot Performed By: Physician [name] Shan, DO Debridement Type: Debridement Severity of Tissue Pre Debridement: Fat layer exposed Level of Consciousness (Pre-procedure): Awake and Alert Pre-procedure Verification/Time Out Yes - 11:35 Taken: Start Time: 11:35 Pain Control: Lidocaine 5% topical ointment T Area Debrided (L x W): otal 4.8 (cm) x 0.7 (cm) = 3.36 (cm) Tissue and other material debrided: Non-Viable, Slough, Subcutaneous, Slough Level: Skin/Subcutaneous Tissue Debridement Description: Excisional Instrument: Curette Bleeding: Minimum Hemostasis Achieved: Pressure Procedural Pain: 0 Post Procedural Pain: 0 Response to Treatment: Procedure was tolerated well Level of Consciousness (Post- Awake and Alert procedure): Post Debridement  Measurements of Total Wound Length: (cm) 4.8 Width: (cm) 0.7 Depth: (cm) 0.3 Volume: (cm) 0.792 Character of Wound/Ulcer Post Debridement: Improved Severity of Tissue Post Debridement: Fat layer exposed Post Procedure Diagnosis Same as Pre-procedure Notes Scribed for Dr [name] [place] by [name] Creamer, RN Electronic Signature(s) Signed: 07/16/2022 12:04:27 PM By: [name] Shan DO Signed: 07/21/2022 3:57:27 PM By: [name] Creamer RN, [name] Entered By: [name] [name] on 07/16/2022 11:47:53 -------------------------------------------------------------------------------- HPI Details Patient Name: Date of Service: [name] [name], NA [name] [name]. 07/16/2022 10:15 [name] Medical Record Number: [id number] Patient Account Number: [id number] Date of Birth/Sex: Treating RN: [birth year] (67 y.o. M) Primary Care Provider: [name] Other Clinician: Referring Provider: Treating Provider/Extender: [name], [name] in Treatment: 13 History of Present Illness HPI Description: ADMISSION 10/16/20 This is [name] 67 year old man who is [name] type II diabetic. Initially seen by Dr. Unk Lightning of vein and vascular on 09/10/2021 for bilateral lower extremity rest pain right greater than left. His ABIs demonstrated monophasic waveforms at the ankles bilaterally with depressed toe pressures. His ABI in the right was 0.5 and on the left 0.28. There were no obtainable waveforms for TBI's. He underwent angiography on 09/10/2021 and had findings of severe PAD with 95% stenosis of the distal SFA. He also had peroneal and posterior tibial arteries occluded with significant collateralization in the calf there was reconstitution of the posterior tibial artery in the distal third of the calf. Ostia of the anterior tibial artery was not appreciated. He underwent [name] angioplasty of the superficial femoral artery. He required admission to hospital from 09/12/2021 through 09/22/2021 with right foot cellulitis and sepsis. On 3/13 he underwent  [name] right below-knee popliteal to posterior tibial bypass using [name] greater saphenous vein. Ultimately however he required [name] right TMA by podiatry which I believe was done on 3/17 for [name] [name], [name] [name] ([id number]) [phone].pdf Page 4 of 14 progressive diabetic foot infection. He was discharged to [facility] skilled facility. He arrives in clinic today with [name] large wound area over the entirety of his amputation site extending proximally. The attempted flap closure of the amputation site and his TMA has failed and the wound extends under the attempt to closure. Still some sutures in place. There is an odor but he is not systemically unwell. He is due for follow-up arterial studies and has an appointment with vascular surgery on 10/28/2021. They are using wet-to-dry dressings at [facility]. Past medical history includes type 2 diabetes with peripheral neuropathy, hypertension, obstructive sleep apnea 4/20; patient presents for follow-up. He  is being discharged from his facility at [facility] today. They have been doing Dakin's wet-to-dry dressings. He states that his fiance can help with dressing changes. He is getting [facility] to come out 3 times [name] week once he leaves the facility. He is scheduled to see vein and vascular on 5/12. He has not seen the wound himself. He puts covers over his face for wound exams. He currently denies systemic signs of infection. 4/28; patient admitted to the clinic 2 weeks ago with [name] dehisced right TMA in the setting of type 2 diabetes with significant PAD. He is status post revascularization. He has been discharged from the nursing home he was at and now is at home using Dakin's wet-to-dry dressings daily he has home health. He denies any systemic signs of infection 5/4; patient presents for follow-up. His fiance and home health have been changing his dressings. He currently denies systemic signs of infection. He continues to put sheet covers  over his face during our wound encounters because he does not want [name] look at the wound. 5/25; Patient presents for follow-up. He missed his last clinic appointment. He had [name] bone biopsy and culture done at last clinic visit that showed acute osteomyelitis with growth of E. coli, stenotrophomonas maltophilia and enteroccocus faecalis. [name] referral to infectious disease was made. He has not heard from them yet. He has been using Dakin's wet-to-dry dressings. He is now more comfortable with looking at the wound bed. 6/6; the patient comes into clinic today with 2 wounds on the left foot which apparently have been there for several months. This was not known to assess but was known by Dr. Donzetta Matters. In fact he underwent an angiogram yesterday. He had [name] laser atherectomy of the left posterior tibial artery with [name] 3 mm balloon angioplasty, also [name] stent of the last left SFA. 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 This is [name] lot better than the last time I saw it. He has been using Dakin's wet-to-dry. He has an appointment with infectious disease for [name] bone biopsy which showed E. coli, stenotrophomonas and Enterococcus faecalis. The appointment is for tomorrow 04/14/2022 [name] [name] is an uncontrolled type 2 diabetic on insulin that presents with [name] left foot ulcer. 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 He had revision of this site on 6/17 by Dr. Posey Pronto, podiatry. He states he has had [name] wound VAC on this area for the past 3 weeks. It is unclear what the prior wound care has been. He has [name] history of osteomyelitis to this area and was treated with IV and oral antibiotics For 6 weeks by Dr. Linus Salmons of infectious disease. At his last follow-up on 02/20/2022 his CRP was trending down and antibiotics were stopped. He also has [name] history of  peripheral arterial disease status post stenting to the left SFA and balloon angioplasty to the left posterior tibial artery On 12/2021. He has follow-up with vein and vascular at the end of the month. He also has [name] wound to the dorsal aspect of the left foot that he has been placing Betadine on. 10/19; patient presents for follow-up. He had [name] bone biopsy of the left foot at last clinic visit showed osteomyelitis. The bone culture showed Enterobacter cloacae, Enterococcus faecalis, Staphylococcus aureus, viridans  group strep, actinotigum schaelii. He has been referred to infectious disease And has an appointment on October 25. He has been using Dakin's wet-to-dry dressing to the lateral left foot wound and Hydrofera Blue and Medihoney to the dorsal foot wound. He has no issues or complaints today. He denies systemic signs of infection. 10/26; patient presents for follow-up. He saw Dr. Linus Salmons yesterday with infectious disease and has been prescribed doxycycline and Levaquin for his chronic osteomyelitis. He has been using Dakin's wet-to-dry dressings to the lateral left foot wound and Medihoney and Hydrofera Blue to the dorsal wound. We discussed [name] skin graft for the dorsal foot wound and he would like to proceed with insurance verification. 11/3; patient with [name] wound on the dorsal left foot and [name] substantial postsurgical area on the lateral left foot fifth ray amputation. He is also followed by Dr. Alton Revere of infectious disease. He had [name] bone biopsy from the left lateral foot that showed acute osteomyelitis. Swab culture showed multiple organisms. He has also been revascularized by infectious disease status post left SFA stenting and tibial laser atherectomy and angioplasty. An MRI of the left foot had previously shown osteomyelitis of the fifth met head we have been using Dakin's wet-to-dry to the left lateral foot and Medihoney and Hydrofera Blue to the wound on the dorsal foot. 11/13; HBO treatment was  discussed at last clinic visit and patient would like to proceed with this. He has orientation today. He has been approved for Grafix. We have been using Hydrofera Blue and Medihoney to the left dorsal foot and collagen to the left lateral foot. Patient has home health that comes in for dressing changes. He currently has no issues or complaints today. 12/7; patient missed his last clinic appointment. He was last seen 3 weeks ago. He is currently off of antibiotics per ID. He completed [name] prolonged treatment. He has been using collagen to the lateral foot wound. He has been using Medihoney and Hydrofera Blue to the dorsal foot wound. He has been doing well with hyperbaric oxygen therapy. He has no issues or complaints today. He denies signs of of infection. 12/14; patient presents for follow-up. We have been using Grafix to the wound beds. He has had trouble equalizing the pressure in his ears during HBO treatments. It was recommended he follow-up with ENT He does not have an appointment yet. He currently denies signs of infection. . 12/21; patient presents for follow-up. He was seen by ENT and had tubes placed. He is continued HBO without issues. We have been using Grafix to the wound beds. There is been improvement in wound healing. 12/21; patient presents for follow-up. He has no issues or complaints today. There is been improvement in wound healing. 1/5; patient presents for follow-up. At last clinic visit Grafix was placed in standard fashion. He has no issues or complaints today. He is tolerating hyperbaric oxygen therapy well. He denies signs of infection. 1/11; patient presents for follow-up. We have been placing Grafix to the wound beds on the left foot. The dorsal left foot wound has healed. The left lateral foot wound has shown improvement in healing. Unfortunately has had dehiscence of his previous surgical wound site on the right foot. He currently denies signs of infection. Electronic  Signature(s) Signed: 07/16/2022 12:04:27 PM By: [name] Shan DO Entered By: [name] [name] on 07/16/2022 11:56:40 [name] [name] ([id number]) [id number] Page 5 of 14 -------------------------------------------------------------------------------- Physical Exam Details Patient Name: Date of Service: [name] [name], NA [name] [name].  07/16/2022 10:15 [name] Medical Record Number: [id number] Patient Account Number: [id number] Date of Birth/Sex: Treating RN: 08-11-1955 (67 y.o. M) Primary Care Provider: [name] Other Clinician: Referring Provider: Treating Provider/Extender: [name], [name] in Treatment: 13 Constitutional respirations regular, non-labored and within target range for patient.. Cardiovascular 2+ dorsalis pedis/posterior tibialis pulses. Psychiatric pleasant and cooperative. Notes Left foot: T the lateral aspect there is an open wound with granulation tissue and nonviable tissue. No probing to bone . T the dorsal aspect there is o o epithelialization to the previous wound site. Right foot: Along the previous TMA site there is an open wound with nonviable tissue, granulation tissue and callus. There is increased depth that comes close to the bone. No signs of surrounding soft tissue infection. Electronic Signature(s) Signed: 07/16/2022 12:04:27 PM By: [name] Shan DO Entered By: [name] [name] on 07/16/2022 11:58:48 -------------------------------------------------------------------------------- Physician Orders Details Patient Name: Date of Service: [name] [name], NA [name] [name]. 07/16/2022 10:15 [name] Medical Record Number: [id number] Patient Account Number: [id number] Date of Birth/Sex: Treating RN: October 11, 1955 (67 y.o. [name] [name] Primary Care Provider: [name] Other Clinician: Referring Provider: Treating Provider/Extender: [name] in Treatment: [phone] Verbal / Phone Orders: No Diagnosis  Coding Follow-up Appointments ppointment in 1 week. - W/ Dr. Heber [place] (ALREADY HAS APPT 01/19 @ 10:15) . Return [name] ppointment in 2 weeks. - w/ Dr. Heber [place] Return [name] Anesthetic (In clinic) Topical Lidocaine 5% applied to wound bed Cellular or Tissue Based Products Cellular or Tissue Based Product Type: - Run IVR for Grafix for left dorsal foot- pending Grafix # 1 applied 06/11/22 Grafix # 2 applied 06/18/22 Grafix # 3 applied 06/25/22 GRafix # 4 applied 07/02/22 Grafix # 5 applied 07/10/22 Edema Control - Lymphedema / SCD / Other Avoid standing for long periods of time. Off-Loading Open toe surgical shoe to: - wear when standing or walking. Hyperbaric Oxygen Therapy Evaluate for HBO Therapy [name] [name], [name] [name] ([id number]) [id number] Page 6 of 14 Indication: - Wagner Grade III to left lateral foot 2.5 ATA for 90 Minutes with 2 Five (5) Minute [name] Breaks ir Total Number of Treatments: - 40 One treatments per day (delivered Monday through Friday unless otherwise specified in Special Instructions below): Finger stick Blood Glucose Pre- and Post- HBOT Treatment. Follow Hyperbaric Oxygen Glycemia Protocol Afrin (Oxymetazoline HCL) 0.05% nasal spray - 1 spray in both nostrils daily as needed prior to HBO treatment for difficulty clearing ears Wound Treatment Wound #5 - Foot Wound Laterality: Left, Lateral Cleanser: Wound Cleanser (Generic) 1 x Per Day/30 Days Discharge Instructions: Cleanse the wound with wound cleanser prior to applying [name] clean dressing using gauze sponges, not tissue or cotton balls. Peri-Wound Care: Skin Prep (Generic) 1 x Per Day/30 Days Discharge Instructions: Use skin prep as directed Prim Dressing: Grafix (Generic) 1 x Per Day/30 Days ary Secondary Dressing: ABD Pad, 8x10 (Generic) 1 x Per Day/30 Days Discharge Instructions: Apply over primary dressing as directed. Secured With: [employer], 4.5x3.1 (in/yd) (DME) (Generic) 1 x  Per Day/30 Days Discharge Instructions: Secure with Kerlix as directed. Secured With: 15M Medipore H Soft Cloth Surgical T ape, 4 x 10 (in/yd) (DME) (Generic) 1 x Per Day/30 Days Discharge Instructions: Secure with tape as directed. Wound #6 - Amputation Site - Transmetatarsal Wound Laterality: Right Cleanser: Wound Cleanser (DME) (Generic) 1 x Per Day/30 Days Discharge Instructions: Cleanse the wound with wound cleanser prior to applying [name] clean dressing using gauze sponges, not tissue or  cotton balls. Prim Dressing: Dakin's Solution 0.25%, 16 (oz) 1 x Per Day/30 Days ary Discharge Instructions: Moisten gauze with Dakin's solution Secondary Dressing: ABD Pad, 8x10 1 x Per Day/30 Days Discharge Instructions: Apply over primary dressing as directed. Secondary Dressing: Woven Gauze Sponge, Non-Sterile 4x4 in (DME) (Generic) 1 x Per Day/30 Days Discharge Instructions: Apply over primary dressing as directed. Secured With: [employer], 4.5x3.1 (in/yd) (DME) (Generic) 1 x Per Day/30 Days Discharge Instructions: Secure with Kerlix as directed. Patient Medications llergies: gemfibrozil, OxyContin, pork derived (porcine), simvastatin [name] Notifications Medication Indication Start End prior to debridement 07/16/2022 lidocaine DOSE topical 5 % ointment - ointment topical once daily GLYCEMIA INTERVENTIONS PROTOCOL PRE-HBO GLYCEMIA INTERVENTIONS ACTION INTERVENTION Obtain pre-HBO capillary blood glucose (ensure 1 physician order is in chart). [name]. Notify HBO physician and await physician orders. 2 If result is 70 mg/dl or below: B. If the result meets the hospital definition of [name] critical result, follow hospital policy. [name]. Give patient an 8 ounce Glucerna Shake, an 8 ounce Ensure, or 8 ounces of [name] Glucerna/Ensure equivalent dietary supplement*. B. Wait 30 minutes. If result is 71 mg/dl to 130 mg/dl: C. Retest patients capillary blood glucose (CBG). D. If result greater than or equal  to 110 mg/dl, proceed with HBO. If result less than 110 mg/dl, notify HBO physician and consider holding HBO. If result is 131 mg/dl to 249 mg/dl: [name]. Proceed with HBO. [name]. Notify HBO physician and await physician orders. B. It is recommended to hold HBO and do If result is 250 mg/dl or greater: blood/urine ketone testing. C. If the result meets the hospital definition of [name] critical result, follow hospital policy. [name] [name], [name] [name] ([id number]) [id number] Page 7 of 14 POST-HBO GLYCEMIA INTERVENTIONS ACTION INTERVENTION Obtain post HBO capillary blood glucose (ensure 1 physician order is in chart). [name]. Notify HBO physician and await physician orders. 2 If result is 70 mg/dl or below: B. If the result meets the hospital definition of [name] critical result, follow hospital policy. [name]. Give patient an 8 ounce Glucerna Shake, an 8 ounce Ensure, or 8 ounces of [name] Glucerna/Ensure equivalent dietary supplement*. B. Wait 15 minutes for symptoms of If result is 71 mg/dl to 100 mg/dl: hypoglycemia (i.e. nervousness, anxiety, sweating, chills, clamminess, irritability, confusion, tachycardia or dizziness). C. If patient asymptomatic, discharge patient. If patient symptomatic, repeat capillary blood glucose (CBG) and notify HBO physician. If result is 101 mg/dl to 249 mg/dl: [name]. Discharge patient. [name]. Notify HBO physician and await physician orders. B. It is recommended to do blood/urine ketone If result is 250 mg/dl or greater: testing. C. If the result meets the hospital definition of [name] critical result, follow hospital policy. *Juice or candies are NOT equivalent products. If patient refuses the Glucerna or Ensure, please consult the hospital dietitian for an appropriate substitute. Electronic Signature(s) Signed: 07/16/2022 3:44:18 PM By: [name] Shan DO Signed: 07/21/2022 3:57:27 PM By: [name] Creamer RN, [name] Previous Signature: 07/16/2022 12:04:27 PM  Version By: [name] Shan DO Entered By: [name] [name] on 07/16/2022 14:47:34 -------------------------------------------------------------------------------- Problem List Details Patient Name: Date of Service: [name] [name], NA [name] [name]. 07/16/2022 10:15 [name] Medical Record Number: [id number] Patient Account Number: [id number] Date of Birth/Sex: Treating RN: 1955/07/16 (67 y.o. M) Primary Care Provider: [name] Other Clinician: Referring Provider: Treating Provider/Extender: [name] in Treatment: 13 Active Problems ICD-10 Encounter Code Description Active Date MDM Diagnosis L97.522 Non-pressure chronic ulcer of other part of left foot with fat layer exposed 04/14/2022  No Yes M86.172 Other acute osteomyelitis, left ankle and foot 05/08/2022 No Yes L97.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 specified 04/14/2022 No Yes severity E11.621 Type 2 diabetes mellitus with foot ulcer 04/14/2022 No Yes E11.42 Type 2 diabetes mellitus with diabetic polyneuropathy 04/14/2022 No Yes S91.301A Unspecified open wound, right foot, initial encounter 07/16/2022 No Yes [name] [name], [name] [name] ([id number]) [phone].pdf Page 8 of 14 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 07/16/2022 No Yes severity Inactive Problems Resolved Problems Electronic Signature(s) Signed: 07/16/2022 12:04:27 PM By: [name] Shan DO Entered By: [name] [name] on 07/16/2022 12:03:39 -------------------------------------------------------------------------------- Progress Note Details Patient Name: Date of Service: [name] [name], NA [name] [name]. 07/16/2022 10:15 [name] Medical Record Number: [id number] Patient Account Number: [id number] Date of Birth/Sex: Treating RN: 26-May-1956 (67 y.o. M) Primary Care Provider: [name] Other Clinician: Referring Provider: Treating Provider/Extender: [name],  [name] in Treatment: 13 Subjective Chief Complaint Information obtained from Patient 10/16/20; patient is here with [name] substantial wound on his right foot in the setting of [name] recent transmetatarsal amputation. 04/14/2022; referral from podiatry for postsurgical wound of nonhealing partial left fifth ray amputation, right foot (07/16/22): TMA dehiscence History of Present Illness (HPI) ADMISSION 10/16/20 This is [name] 67 year old man who is [name] type II diabetic. Initially seen by Dr. Unk Lightning of vein and vascular on 09/10/2021 for bilateral lower extremity rest pain right greater than left. His ABIs demonstrated monophasic waveforms at the ankles bilaterally with depressed toe pressures. His ABI in the right was 0.5 and on the left 0.28. There were no obtainable waveforms for TBI's. He underwent angiography on 09/10/2021 and had findings of severe PAD with 95% stenosis of the distal SFA. He also had peroneal and posterior tibial arteries occluded with significant collateralization in the calf there was reconstitution of the posterior tibial artery in the distal third of the calf. Ostia of the anterior tibial artery was not appreciated. He underwent [name] angioplasty of the superficial femoral artery. He required admission to hospital from 09/12/2021 through 09/22/2021 with right foot cellulitis and sepsis. On 3/13 he underwent [name] right below-knee popliteal to posterior tibial bypass using [name] greater saphenous vein. Ultimately however he required [name] right TMA by podiatry which I believe was done on 3/17 for progressive diabetic foot infection. He was discharged to [facility] skilled facility. He arrives in clinic today with [name] large wound area over the entirety of his amputation site extending proximally. The attempted flap closure of the amputation site and his TMA has failed and the wound extends under the attempt to closure. Still some sutures in place. There is an odor but he is not systemically unwell. He is due  for follow-up arterial studies and has an appointment with vascular surgery on 10/28/2021. They are using wet-to-dry dressings at [facility]. Past medical history includes type 2 diabetes with peripheral neuropathy, hypertension, obstructive sleep apnea 4/20; patient presents for follow-up. He is being discharged from his facility at [facility] today. They have been doing Dakin's wet-to-dry dressings. He states that his fiance can help with dressing changes. He is getting [facility] to come out 3 times [name] week once he leaves the facility. He is scheduled to see vein and vascular on 5/12. He has not seen the wound himself. He puts covers over his face for wound exams. He currently denies systemic signs of infection. 4/28; patient admitted to the clinic 2 weeks ago with [name] dehisced right TMA  in the setting of type 2 diabetes with significant PAD. He is status post revascularization. He has been discharged from the nursing home he was at and now is at home using Dakin's wet-to-dry dressings daily he has home health. He denies any systemic signs of infection 5/4; patient presents for follow-up. His fiance and home health have been changing his dressings. He currently denies systemic signs of infection. He continues to put sheet covers over his face during our wound encounters because he does not want [name] look at the wound. 5/25; Patient presents for follow-up. He missed his last clinic appointment. He had [name] bone biopsy and culture done at last clinic visit that showed acute osteomyelitis with growth of E. coli, stenotrophomonas maltophilia and enteroccocus faecalis. [name] referral to infectious disease was made. He has not heard from them yet. He has been using Dakin's wet-to-dry dressings. He is now more comfortable with looking at the wound bed. 6/6; the patient comes into clinic today with 2 wounds on the left foot which apparently have been there for several months. This was not known to assess  but was known by Dr. Donzetta Matters. In fact he underwent an angiogram yesterday. He had [name] laser atherectomy of the left posterior tibial artery with [name] 3 mm balloon angioplasty, also [name] stent of the last left SFA. 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 This is [name] lot better than the last time I saw it. He has been using Dakin's wet-to-dry. He has an appointment with [name] [name], [name] [name] ([id number]) [id number] Page 9 of 14 infectious disease for [name] bone biopsy which showed E. coli, stenotrophomonas and Enterococcus faecalis. The appointment is for tomorrow 04/14/2022 Mr. Nalin Mazzocco [name] is an uncontrolled type 2 diabetic on insulin that presents with [name] left foot ulcer. 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 He had revision of this site on 6/17 by Dr. Posey Pronto, podiatry. He states he has had [name] wound VAC on this area for the past 3 weeks. It is unclear what the prior wound care has been. He has [name] history of osteomyelitis to this area and was treated with IV and oral antibiotics For 6 weeks by Dr. Linus Salmons of infectious disease. At his last follow-up on 02/20/2022 his CRP was trending down and antibiotics were stopped. He also has [name] history of peripheral arterial disease status post stenting to the left SFA and balloon angioplasty to the left posterior tibial artery On 12/2021. He has follow-up with vein and vascular at the end of the month. He also has [name] wound to the dorsal aspect of the left foot that he has been placing Betadine on. 10/19; patient presents for follow-up. He had [name] bone biopsy of the left foot at last clinic visit showed osteomyelitis. The bone culture showed Enterobacter cloacae, Enterococcus faecalis, Staphylococcus aureus, viridans group strep, actinotigum schaelii. He has been referred to infectious  disease And has an appointment on October 25. He has been using Dakin's wet-to-dry dressing to the lateral left foot wound and Hydrofera Blue and Medihoney to the dorsal foot wound. He has no issues or complaints today. He denies systemic signs of infection. 10/26; patient presents for follow-up. He saw Dr. Linus Salmons yesterday with infectious disease and has been prescribed doxycycline and Levaquin for his chronic osteomyelitis. He has been using  Dakin's wet-to-dry dressings to the lateral left foot wound and Medihoney and Hydrofera Blue to the dorsal wound. We discussed [name] skin graft for the dorsal foot wound and he would like to proceed with insurance verification. 11/3; patient with [name] wound on the dorsal left foot and [name] substantial postsurgical area on the lateral left foot fifth ray amputation. He is also followed by Dr. Alton Revere of infectious disease. He had [name] bone biopsy from the left lateral foot that showed acute osteomyelitis. Swab culture showed multiple organisms. He has also been revascularized by infectious disease status post left SFA stenting and tibial laser atherectomy and angioplasty. An MRI of the left foot had previously shown osteomyelitis of the fifth met head we have been using Dakin's wet-to-dry to the left lateral foot and Medihoney and Hydrofera Blue to the wound on the dorsal foot. 11/13; HBO treatment was discussed at last clinic visit and patient would like to proceed with this. He has orientation today. He has been approved for Grafix. We have been using Hydrofera Blue and Medihoney to the left dorsal foot and collagen to the left lateral foot. Patient has home health that comes in for dressing changes. He currently has no issues or complaints today. 12/7; patient missed his last clinic appointment. He was last seen 3 weeks ago. He is currently off of antibiotics per ID. He completed [name] prolonged treatment. He has been using collagen to the lateral foot wound. He has been using  Medihoney and Hydrofera Blue to the dorsal foot wound. He has been doing well with hyperbaric oxygen therapy. He has no issues or complaints today. He denies signs of of infection. 12/14; patient presents for follow-up. We have been using Grafix to the wound beds. He has had trouble equalizing the pressure in his ears during HBO treatments. It was recommended he follow-up with ENT He does not have an appointment yet. He currently denies signs of infection. . 12/21; patient presents for follow-up. He was seen by ENT and had tubes placed. He is continued HBO without issues. We have been using Grafix to the wound beds. There is been improvement in wound healing. 12/21; patient presents for follow-up. He has no issues or complaints today. There is been improvement in wound healing. 1/5; patient presents for follow-up. At last clinic visit Grafix was placed in standard fashion. He has no issues or complaints today. He is tolerating hyperbaric oxygen therapy well. He denies signs of infection. 1/11; patient presents for follow-up. We have been placing Grafix to the wound beds on the left foot. The dorsal left foot wound has healed. The left lateral foot wound has shown improvement in healing. Unfortunately has had dehiscence of his previous surgical wound site on the right foot. He currently denies signs of infection. Patient History Information obtained from Patient, Chart. Family History Diabetes - Mother,Siblings, Heart Disease - Siblings, Hypertension - Siblings, Stroke - Mother, No family history of Cancer, Hereditary Spherocytosis, Kidney Disease, Lung Disease, Seizures, Thyroid Problems, Tuberculosis. Social History Former smoker, Marital Status - Single, Alcohol Use - Never, Drug Use - No History, Caffeine Use - Daily. Medical History Eyes Denies history of Cataracts, Glaucoma, Optic Neuritis Respiratory Patient has history of Asthma, Sleep Apnea Denies history of Aspiration, Chronic  Obstructive Pulmonary Disease (COPD), Pneumothorax, Tuberculosis Cardiovascular Patient has history of Hypertension, Peripheral Venous Disease Denies history of Angina, Arrhythmia, Congestive Heart Failure, Coronary Artery Disease, Deep Vein Thrombosis, Hypotension, Myocardial Infarction, Peripheral Arterial Disease, Phlebitis, Vasculitis Endocrine Patient has history of  Type II Diabetes Denies history of Type I Diabetes Integumentary (Skin) Denies history of History of Burn Musculoskeletal Patient has history of Osteomyelitis Denies history of Gout, Rheumatoid Arthritis, Osteoarthritis Neurologic Patient has history of Neuropathy Denies history of Dementia, Quadriplegia, Paraplegia, Seizure Disorder Hospitalization/Surgery History - 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 Posey Pronto. - aortogram 12/08/2021 Dr. Virl Cagey [facility]. Medical [name] Surgical History Notes nd Psychiatric PTSD [name] [name], [name] [name] ([id number]) [id number] Page 10 of 14 Objective Constitutional respirations regular, non-labored and within target range for patient.. Vitals Time Taken: 10:47 AM, Height: 72 in, Weight: 220 lbs, BMI: 29.8, Temperature: 97.9 F, Pulse: 81 bpm, Respiratory Rate: 18 breaths/min, Blood Pressure: 137/97 mmHg, Capillary Blood Glucose: 139 mg/dl. Cardiovascular 2+ dorsalis pedis/posterior tibialis pulses. Psychiatric pleasant and cooperative. General Notes: Left foot: T the lateral aspect there is an open wound with granulation tissue and nonviable tissue. No probing to bone . T the dorsal aspect o o there is epithelialization to the previous wound site. Right foot: Along the previous TMA site there is an open wound with nonviable tissue, granulation tissue and callus. There is increased depth that comes close to the bone. No signs of surrounding soft tissue infection. Integumentary (Hair, Skin) Wound #4 status is Healed - Epithelialized. Original cause  of wound was Gradually Appeared. The date acquired was: 09/03/2021. The wound has been in treatment 13 weeks. The wound is located on the Left,Dorsal Foot. The wound measures 0cm length x 0cm width x 0cm depth; 0cm^2 area and 0cm^3 volume. There is no tunneling or undermining noted. There is [name] none present amount of drainage noted. The wound margin is distinct with the outline attached to the wound base. There is no granulation within the wound bed. There is no necrotic tissue within the wound bed. The periwound skin appearance had no abnormalities noted for moisture. The periwound skin appearance had no abnormalities noted for color. The periwound skin appearance did not exhibit: Callus, Crepitus, Excoriation, Induration, Rash, Scarring. Periwound temperature was noted as No Abnormality. Wound #5 status is Open. Original cause of wound was Surgical Injury. The date acquired was: 12/20/2021. The wound has been in treatment 13 weeks. The wound is located on the Left,Lateral Foot. The wound measures 4.8cm length x 0.7cm width x 0.3cm depth; 2.639cm^2 area and 0.792cm^3 volume. There is Fat Layer (Subcutaneous Tissue) exposed. There is no tunneling or undermining noted. There is [name] medium amount of serosanguineous drainage noted. The wound margin is epibole. There is large (67-100%) red, pink granulation within the wound bed. There is [name] small (1-33%) amount of necrotic tissue within the wound bed including Adherent Slough. The periwound skin appearance had no abnormalities noted for color. The periwound skin appearance exhibited: Callus. The periwound skin appearance did not exhibit: Crepitus, Excoriation, Induration, Rash, Scarring, Dry/Scaly, Maceration. Periwound temperature was noted as No Abnormality. Wound #6 status is Open. Original cause of wound was Gradually Appeared. The date acquired was: 07/16/2022. The wound is located on the Right Amputation Site - Transmetatarsal. The wound measures 2.5cm  length x 0.5cm width x 1cm depth; 0.982cm^2 area and 0.982cm^3 volume. There is Fat Layer (Subcutaneous Tissue) exposed. There is no tunneling or undermining noted. There is [name] medium amount of serosanguineous drainage noted. There is small (1- 33%) red granulation within the wound bed. There is [name] large (67-100%) amount of necrotic tissue within the wound bed including Eschar. The periwound skin appearance exhibited: Callus. Assessment Active Problems ICD-10 Non-pressure chronic ulcer of other part of  left foot with fat layer exposed Other acute osteomyelitis, left ankle and foot Non-pressure chronic ulcer of other part of left foot with other specified severity Type 2 diabetes mellitus with foot ulcer Type 2 diabetes mellitus with diabetic polyneuropathy Unspecified open wound, right foot, initial encounter Patient's left dorsal foot wound has healed. The left lateral foot wound has improved in size and apperance since last clinic visit. I debrided nonviable tissue. Grafix was placed in standard fashion. Unfortunately he has developed [name] wound to the right foot. His podiatrist evaluated this on 1/9. Per patient nothing further to do. I debrided nonviable tissue here. I recommended Dakin's wet-to-dry dressings. He currently wears surgical shoes. I recommended [name] peg assist to the right foot to help with offloading. Follow-up in 1 week. Continue HBO therapy. Procedures Wound #5 Pre-procedure diagnosis of Wound #5 is [name] Diabetic Wound/Ulcer of the Lower Extremity located on the Left,Lateral Foot .Severity of Tissue Pre Debridement is: Fat layer exposed. There was [name] Excisional Skin/Subcutaneous Tissue Debridement with [name] total area of 3.36 sq cm performed by [name] Shan, DO. With the following instrument(s): Curette to remove Non-Viable tissue/material. Material removed includes Subcutaneous Tissue and Slough and after achieving pain control using Lidocaine 5% topical ointment. No specimens were  taken. [name] time out was conducted at 11:35, prior to the start of the procedure. [name] Minimum amount of bleeding was controlled with Pressure. The procedure was tolerated well with [name] pain level of 0 throughout and [name] pain level of 0 following the procedure. Post Debridement Measurements: 4.8cm length x 0.7cm width x 0.3cm depth; 0.792cm^3 volume. Character of Wound/Ulcer Post Debridement is improved. Severity of Tissue Post Debridement is: Fat layer exposed. Post procedure Diagnosis Wound #5: Same as Pre-Procedure General Notes: Scribed for Dr [name] [place] by [name] Creamer, RN. Pre-procedure diagnosis of Wound #5 is [name] Diabetic Wound/Ulcer of the Lower Extremity located on the Left,Lateral Foot. [name] skin graft procedure using [name] bioengineered skin substitute/cellular or tissue based product was performed by [name] Shan, DO with the following instrument(s): Forceps. Grafix prime [name] [name], [name] ([id number]) [id number] Page 11 of 14 was applied and secured with Steri-Strips. 6 sq cm of product was utilized and 0 sq cm was wasted. Post Application, adaptic, gauze, ABD, compression wrap was applied. [name] Time Out was conducted at 11:40, prior to the start of the procedure. The procedure was tolerated well with [name] pain level of 0 throughout and [name] pain level of 0 following the procedure. Post procedure Diagnosis Wound #5: Same as Pre-Procedure General Notes: Scribed for Dr [name] [place] by [name] Creamer, Rn. Wound #6 Pre-procedure diagnosis of Wound #6 is [name] Diabetic Wound/Ulcer of the Lower Extremity located on the Right Amputation Site - Transmetatarsal .Severity of Tissue Pre Debridement is: Fat layer exposed. There was [name] Excisional Skin/Subcutaneous Tissue Debridement with [name] total area of 1 sq cm performed by [name] Shan, DO. With the following instrument(s): Curette to remove Non-Viable tissue/material. Material removed includes Callus, Subcutaneous Tissue, and Slough. No specimens  were taken. [name] time out was conducted at 11:35, prior to the start of the procedure. [name] Minimum amount of bleeding was controlled with Pressure. The procedure was tolerated well with [name] pain level of 0 throughout and [name] pain level of 0 following the procedure. Post Debridement Measurements: 2cm length x 0.5cm width x 1cm depth; 0.785cm^3 volume. Character of Wound/Ulcer Post Debridement is improved. Severity of Tissue Post Debridement is: Fat layer exposed. Post procedure Diagnosis Wound #6: Same as Pre-Procedure General Notes: Scribed  for Dr [name] [place] by [name] Creamer, Rn. Plan Follow-up Appointments: Return Appointment in 1 week. - W/ Dr. Heber [place] (ALREADY HAS APPT 01/19 @ 10:15) . Return Appointment in 2 weeks. - w/ Dr. Heber [place] Anesthetic: (In clinic) Topical Lidocaine 5% applied to wound bed Cellular or Tissue Based Products: Cellular or Tissue Based Product Type: - Run IVR for Grafix for left dorsal foot- pending Grafix # 1 applied 06/11/22 Grafix # 2 applied 06/18/22 Grafix # 3 applied 06/25/22 GRafix # 4 applied 07/02/22 Grafix # 5 applied 07/10/22 Edema Control - Lymphedema / SCD / Other: Avoid standing for long periods of time. Off-Loading: Open toe surgical shoe to: - wear when standing or walking. Hyperbaric Oxygen Therapy: Evaluate for HBO Therapy Indication: - Wagner Grade III to left lateral foot 2.5 ATA for 90 Minutes with 2 Five (5) Minute Air Breaks T Number of Treatments: - 40 otal One treatments per day (delivered Monday through Friday unless otherwise specified in Special Instructions below): Finger stick Blood Glucose Pre- and Post- HBOT Treatment. Follow Hyperbaric Oxygen Glycemia Protocol Afrin (Oxymetazoline HCL) 0.05% nasal spray - 1 spray in both nostrils daily as needed prior to HBO treatment for difficulty clearing ears The following medication(s) was prescribed: lidocaine topical 5 % ointment ointment topical once daily for prior to debridement was prescribed at  facility WOUND #5: - Foot Wound Laterality: Left, Lateral Cleanser: Wound Cleanser (Home Health) 1 x Per Day/30 Days Discharge Instructions: Cleanse the wound with wound cleanser prior to applying [name] clean dressing using gauze sponges, not tissue or cotton balls. Peri-Wound Care: Skin Prep (Home Health) 1 x Per Day/30 Days Discharge Instructions: Use skin prep as directed Prim Dressing: Grafix 1 x Per Day/30 Days ary Secondary Dressing: ABD Pad, 8x10 (Home Health) 1 x Per Day/30 Days Discharge Instructions: Apply over primary dressing as directed. Secured With: [employer], 4.5x3.1 (in/yd) (Home Health) 1 x Per Day/30 Days Discharge Instructions: Secure with Kerlix as directed. Secured With: 57M Medipore H Soft Cloth Surgical T ape, 4 x 10 (in/yd) (Home Health) 1 x Per Day/30 Days Discharge Instructions: Secure with tape as directed. WOUND #6: - Amputation Site - Transmetatarsal Wound Laterality: Right Cleanser: Soap and Water Discharge Instructions: May shower and wash wound with dial antibacterial soap and water prior to dressing change. Prim Dressing: Dakin's Solution 0.25%, 16 (oz) ary Discharge Instructions: Moisten gauze with Dakin's solution Secondary Dressing: Woven Gauze Sponge, Non-Sterile 4x4 in Discharge Instructions: Apply over primary dressing as directed. Secured With: [employer], 4.5x3.1 (in/yd) Discharge Instructions: Secure with Kerlix as directed. 1. In office sharp debridement 2. Grafix placed in standard fashion 3. Dakin's wet-to-dry 4. Aggressive offloadingoosurgical shoe, peg assist 5. Continue HBO therapy 6. Follow-up in 1 week Electronic Signature(s) Signed: 07/16/2022 12:04:27 PM By: [name] Shan DO Entered By: [name] [name] on 07/16/2022 12:01:53 [name] [name] ([id number]) [id number] Page 12 of 14 -------------------------------------------------------------------------------- HxROS Details Patient  Name: Date of Service: [name] [name], [place] [name] [name]. 07/16/2022 10:15 [name] Medical Record Number: [id number] Patient Account Number: [id number] Date of Birth/Sex: Treating RN: November 02, 1955 (67 y.o. M) Primary Care Provider: [name] Other Clinician: Referring Provider: Treating Provider/Extender: [name] in Treatment: 13 Information Obtained From Patient Chart Eyes Medical History: Negative for: Cataracts; Glaucoma; Optic Neuritis Respiratory Medical History: Positive for: Asthma; Sleep Apnea Negative for: Aspiration; Chronic Obstructive Pulmonary Disease (COPD); Pneumothorax; Tuberculosis Cardiovascular Medical History: Positive for: Hypertension; Peripheral Venous Disease Negative for: Angina; Arrhythmia; Congestive Heart Failure; Coronary Artery  Disease; Deep Vein Thrombosis; Hypotension; Myocardial Infarction; Peripheral Arterial Disease; Phlebitis; Vasculitis Endocrine Medical History: Positive for: Type II Diabetes Negative for: Type I Diabetes Time with diabetes: 20 years Treated with: Insulin, Oral agents Blood sugar tested every day: Yes Tested : 4x day Integumentary (Skin) Medical History: Negative for: History of Burn Musculoskeletal Medical History: Positive for: Osteomyelitis Negative for: Gout; Rheumatoid Arthritis; Osteoarthritis Neurologic Medical History: Positive for: Neuropathy Negative for: Dementia; Quadriplegia; Paraplegia; Seizure Disorder Psychiatric Medical History: Past Medical History Notes: PTSD Immunizations Pneumococcal Vaccine: Received Pneumococcal Vaccination: No Implantable Devices None Hospitalization / Surgery History [name] [name], [name] [name] ([id number]) [phone].pdf Page 13 of 14 Type of Hospitalization/Surgery 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 Posey Pronto aortogram 12/08/2021 Dr. Virl Cagey [facility] Family and Social History Cancer: No; Diabetes: Yes - Mother,Siblings;  Heart Disease: Yes - Siblings; Hereditary Spherocytosis: No; Hypertension: Yes - Siblings; Kidney Disease: No; Lung Disease: No; Seizures: No; Stroke: Yes - Mother; Thyroid Problems: No; Tuberculosis: No; Former smoker; Marital Status - Single; Alcohol Use: Never; Drug Use: No History; Caffeine Use: Daily; Financial Concerns: No; Food, Clothing or Shelter Needs: No; Support System Lacking: No; Transportation Concerns: No Electronic Signature(s) Signed: 07/16/2022 12:04:27 PM By: [name] Shan DO Entered By: [name] [name] on 07/16/2022 11:56:47 -------------------------------------------------------------------------------- SuperBill Details Patient Name: Date of Service: [name] [name], NA [name] [name]. 07/16/2022 Medical Record Number: [id number] Patient Account Number: [id number] Date of Birth/Sex: Treating RN: Dec 26, 1955 (67 y.o. M) Primary Care Provider: [name] Other Clinician: Referring Provider: Treating Provider/Extender: [name], [name] in Treatment: 13 Diagnosis Coding ICD-10 Codes Code Description 901 089 1003 Non-pressure chronic ulcer of other part of left foot with fat layer exposed M86.172 Other acute osteomyelitis, left ankle and foot L97.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 specified severity E11.621 Type 2 diabetes mellitus with foot ulcer E11.42 Type 2 diabetes mellitus with diabetic polyneuropathy S91.301A Unspecified open wound, right foot, initial encounter 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 severity Facility Procedures : CPT4 Code: 96759163 Description: Q4133- Grafix PL 16 mm disc (2 units) ICD-10 Diagnosis Description L97.522 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.621 Type 2 diabetes mellitus with foot ulcer Modifier: Quantity: 6 : CPT4 Code: 84665993 Description: 57017 - SKIN SUB GRAFT FACE/NK/HF/G ICD-10 Diagnosis  Description L97.522 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.621 Type 2 diabetes mellitus with foot ulcer Modifier: Quantity: 1 : CPT4 Code: 79390300 Description: 92330 - DEB SUBQ TISSUE 20 SQ CM/< ICD-10 Diagnosis Description S91.301A Unspecified open wound, right foot, initial encounter E11.621 Type 2 diabetes mellitus with foot ulcer Modifier: Quantity: 1 Physician Procedures : CPT4 Code Description Modifier [id number] 33545 - WC PHYS LEVEL 3 - EST PT ICD-10 Diagnosis Description S91.301A Unspecified open wound, right foot, initial encounter E11.621 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.518 Non-pressure chronic ulcer of  other part of right foot with other specified severity Quantity: 1 : [id number] 37342 - WC PHYS SKIN SUB GRAFT FACE/NK/HF/G ANDON, VILLARD [name] ([id number]) [id number] ICD-10 Diagnosis Description L97.522 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.621 Type 2  diabetes mellitus with foot ulcer Quantity: 1 df Page 14 of 14 : [id number] 74163 - WC PHYS SUBQ TISS 20 SQ CM 1 ICD-10 Diagnosis Description S91.301A Unspecified open wound, right foot, initial encounter E11.621 Type 2 diabetes mellitus with foot ulcer Quantity: Electronic  Signature(s) Signed: 07/16/2022 12:04:27 PM By: [name] Shan DO Entered By: [name] [name] on 07/16/2022 12:04:04

## 2022-07-21 NOTE — Progress Notes (Addendum)
[name], [name] ([id number]) [id number] Page 1 of 2 Visit Report for 07/21/2022 HBO Details Patient Name: Date of Service: [name], [name] 07/21/2022 10:00 [name] Medical Record Number: [id number] Patient Account Number: [id number] Date of Birth/Sex: Treating RN: 19-Dec-1955 (67 y.o. [name] Primary Care Keighan Amezcua: [name] Other Clinician: [name] Referring Nejla Reasor: Treating Karron Alvizo/Extender: [name] in Treatment: 14 HBO Treatment Course Details Treatment Course Number: 1 Ordering Aran Menning: [name] T Treatments Ordered: otal 40 HBO Treatment Start Date: 06/01/2022 HBO Indication: Diabetic Ulcer(s) of the Lower Extremity Standard/Conservative Wound Care tried and failed greater than or equal to 30 days Wound #4 Left, Dorsal Foot , Wound #5 Left, Lateral Foot HBO Treatment Details Treatment Number: 30 Patient Type: Outpatient Chamber Type: Monoplace Chamber Serial #: [id number] Treatment Protocol: 2.5 ATA with 90 minutes oxygen, with two 5 minute air breaks Treatment Details Compression Rate Down: 1.5 psi / minute De-Compression Rate Up: 2.5 psi / minute [name] breaks and breathing ir Compress Tx Pressure periods Decompress Decompress Begins Reached (leave unused spaces Begins Ends blank) Chamber Pressure (ATA 1 2.5 2.5 2.5 2.5 2.5 - - 2.5 1 ) Clock Time (24 hr) 08:58 09:12 09:43 09:48 10:18 10:23 - - 10:53 11:01 Treatment Length: 123 (minutes) Treatment Segments: 4 Vital Signs Capillary Blood Glucose Reference Range: 80 - 120 mg / dl HBO Diabetic Blood Glucose Intervention Range: <131 mg/dl or >249 mg/dl Type: Time Vitals Blood Pulse: Respiratory Capillary Blood Glucose Pulse Action Temperature: Taken: Pressure: Rate: Glucose (mg/dl): Meter #: Oximetry (%) Taken: Pre 08:50 146/80 90 18 98 266 1 informed physician of CBG Post 11:03 181/88 78 18 97.5 271 1 BP retaken manually Post  11:22 162/78 manual BP Treatment Response Treatment Toleration: Well Treatment Completion Status: Treatment Completed without Adverse Event Treatment Notes Patient arrived, blood glucose measured 266 mg/dL. Patient stated that he had eaten 2 Egg and Cheese, 3 strips of [place] bacon, [name] piece of toast without jelly, coffee without sugar. [name] stated that he did not take insulin this morning and measurement of blood glucose at home was 212 mg/dL. Informed Dr. Heber Moody of blood glucose. Patient cleared for treatment. Chamber was compressed at [name] rate of 1 psi/min until reaching approximately 4 psig at which point it was confirmed that he was not experiencing any issues with ear equalization. Rate set was increased to 2 psi/min until reaching treatment depth. Patient tolerated treatment and decompression at the rate of 2.5 psi/min. Post-treatment blood glucose level was 271 mg/dL. Patient had apparent thirst with no other symptoms. Patient was stable upon discharge. Additional Procedure Documentation Tissue Sevierity: Necrosis of bone Physician HBO Attestation: I certify that I supervised this HBO treatment in accordance with Medicare guidelines. [name] trained emergency response team is readily available per Yes hospital policies and procedures. Continue HBOT as ordered. PROCTOR, CARRIKER [name] ([id number]) [id number] Page 2 of 2 Electronic Signature(s) Signed: 07/21/2022 3:36:41 PM By: [name] DO Previous Signature: 07/21/2022 1:22:36 PM Version By: [name] CHT EMT BS , , Entered By: [name] on 07/21/2022 14:51:39 -------------------------------------------------------------------------------- HBO Safety Checklist Details Patient Name: Date of Service: [name], [name] 07/21/2022 10:00 [name] Medical Record Number: [id number] Patient Account Number: [id number] Date of Birth/Sex: Treating RN: Oct 29, 1955 (67 y.o. [name] Primary  Care [name]: [name] Other Clinician: [name] Referring Zakiah Gauthreaux: Treating Colum Colt/Extender: [name] in Treatment: 14 HBO Safety Checklist Items Safety Checklist Consent  Form Signed Patient voided / foley secured and emptied When did you last eato 0630 Last dose of injectable or oral agent Yesterday Ostomy pouch emptied and vented if applicable NA All implantable devices assessed, documented and approved NA Intravenous access site secured and place NA Valuables secured Linens and cotton and cotton/polyester blend (less than 51% polyester) Personal oil-based products / skin lotions / body lotions removed Wigs or hairpieces removed NA Smoking or tobacco materials removed NA Books / newspapers / magazines / loose paper removed Cologne, aftershave, perfume and deodorant removed Jewelry removed (may wrap wedding band) Make-up removed NA Hair care products removed Battery operated devices (external) removed Heating patches and chemical warmers removed Titanium eyewear removed Nail polish cured greater than 10 hours NA Casting material cured greater than 10 hours NA Hearing aids removed NA Loose dentures or partials removed left at home Prosthetics have been removed NA Patient demonstrates correct use of air break device (if applicable) Patient concerns have been addressed Patient grounding bracelet on and cord attached to chamber Specifics for Inpatients (complete in addition to above) Medication sheet sent with patient NA Intravenous medications needed or due during therapy sent with patient NA Drainage tubes (e.g. nasogastric tube or chest tube secured and vented) NA Endotracheal or Tracheotomy tube secured NA Cuff deflated of air and inflated with saline NA Airway suctioned NA Notes Paper version used prior to treatment. Electronic Signature(s) Signed: 07/21/2022 1:09:17 PM By: [name] CHT EMT BS ,  , Entered By: [name] on 07/21/2022 13:09:16

## 2022-07-21 NOTE — Progress Notes (Signed)
MAMOUDOU, MULVEHILL [name] ([id number]) [id number] Page 1 of 10 Visit Report for 07/16/2022 Arrival Information Details Patient Name: Date of Service: [name], [name]. 07/16/2022 10:15 [name] Medical Record Number: [id number] Patient Account Number: [id number] Date of Birth/Sex: Treating RN: 11-13-1955 (67 y.o. [name] Primary Care Macil Crady: [name] Other Clinician: Referring Sundeep Cary: Treating Aleathia Purdy/Extender: [name] in Treatment: 13 Visit Information History Since Last Visit Added or deleted any medications: No Patient Arrived: Wheel Chair Any new allergies or adverse reactions: No Arrival Time: 11:22 Had [name] fall or experienced change in No Accompanied By: self activities of daily living that may affect Transfer Assistance: None risk of falls: Patient Identification Verified: Yes Signs or symptoms of abuse/neglect since last visito No Secondary Verification Process Completed: Yes Hospitalized since last visit: No Patient Requires Transmission-Based Precautions: No Implantable device outside of the clinic excluding No Patient Has Alerts: Yes cellular tissue based products placed in the center Patient Alerts: ESBL;MRSA since last visit: Has Dressing in Place as Prescribed: Yes Pain Present Now: No Electronic Signature(s) Signed: 07/21/2022 3:57:27 PM By: Sharyn Creamer RN, [name] Entered By: Sharyn Creamer on 07/16/2022 11:23:10 -------------------------------------------------------------------------------- Encounter Discharge Information Details Patient Name: Date of Service: [name], [name]. 07/16/2022 10:15 [name] Medical Record Number: [id number] Patient Account Number: [id number] Date of Birth/Sex: Treating RN: 03-12-1956 (67 y.o. [name] Primary Care Geddy Boydstun: [name] Other Clinician: Referring Tag Wurtz: Treating Randon Somera/Extender: [name] in Treatment:  13 Encounter Discharge Information Items Post Procedure Vitals Discharge Condition: Stable Temperature (F): 97.9 Ambulatory Status: Wheelchair Pulse (bpm): 81 Discharge Destination: Home Respiratory Rate (breaths/min): 18 Transportation: Private Auto Blood Pressure (mmHg): 137/97 Accompanied By: self Schedule Follow-up Appointment: Yes Clinical Summary of Care: Patient Declined Electronic Signature(s) Signed: 07/21/2022 3:57:27 PM By: Sharyn Creamer RN, [name] Entered By: Sharyn Creamer on 07/16/2022 14:44:28 [name] ([id number]) [id number] Page 2 of 10 -------------------------------------------------------------------------------- Lower Extremity Assessment Details Patient Name: Date of Service: [name], [name]. 07/16/2022 10:15 [name] Medical Record Number: [id number] Patient Account Number: [id number] Date of Birth/Sex: Treating RN: [birth year] (67 y.o. [name] Primary Care Carollynn Pennywell: [name] Other Clinician: Referring Orton Capell: Treating Katara Griner/Extender: [name], [name] in Treatment: 13 Edema Assessment Assessed: [Left: No] [Right: No] Edema: [Left: Ye] [Right: s] Calf Left: Right: Point of Measurement: 36 cm From Medial Instep 39.1 cm Ankle Left: Right: Point of Measurement: 9 cm From Medial Instep 22.7 cm Vascular Assessment Pulses: Dorsalis Pedis Palpable: [Left:Yes] Electronic Signature(s) Signed: 07/21/2022 3:57:27 PM By: Sharyn Creamer RN, [name] Entered By: Sharyn Creamer on 07/16/2022 11:29:29 -------------------------------------------------------------------------------- Multi Wound Chart Details Patient Name: Date of Service: [name], [name]. 07/16/2022 10:15 [name] Medical Record Number: [id number] Patient Account Number: [id number] Date of Birth/Sex: Treating RN: July 11, 1955 (67 y.o. M) Primary Care Katheryn Culliton: [name] Other Clinician: Referring Llana Deshazo: Treating  Nikita Surman/Extender: [name], [name] in Treatment: 13 Vital Signs Height(in): 72 Capillary Blood Glucose(mg/dl): 139 Weight(lbs): 220 Pulse(bpm): 81 Body Mass Index(BMI): 29.8 Blood Pressure(mmHg): 137/97 Temperature(F): 97.9 Respiratory Rate(breaths/min): 18 [4:Photos:] [6:No Photos [id number].pdf Page 3 of 10] Left, Dorsal Foot Left, Lateral Foot Right Amputation Site - Wound Location: Transmetatarsal Gradually Appeared Surgical Injury Gradually Appeared Wounding Event: Diabetic Wound/Ulcer of the Lower Diabetic Wound/Ulcer of the Lower Diabetic Wound/Ulcer of the Lower Primary Etiology: Extremity Extremity Extremity [name] Open Surgical Wound [name] Secondary Etiology: Asthma, Sleep Apnea, Hypertension, Asthma, Sleep Apnea, Hypertension,  Asthma, Sleep Apnea, Hypertension, Comorbid History: Peripheral Venous Disease, Type II Peripheral Venous Disease, Type II Peripheral Venous Disease, Type II Diabetes, Osteomyelitis, Neuropathy Diabetes, Osteomyelitis, Neuropathy Diabetes, Osteomyelitis, Neuropathy 09/03/2021 12/20/2021 07/16/2022 Date Acquired: 13 13 0 [name]: Healed - Epithelialized Open Open Wound Status: No No No Wound Recurrence: 0x0x0 4.8x0.7x0.3 2.5x0.5x1 Measurements L x W x D (cm) 0 2.639 0.982 [name] (cm) : rea 0 0.792 0.982 Volume (cm) : 100.00% 73.60% [name] % Reduction in [name] rea: 100.00% 84.20% [name] % Reduction in Volume: Grade 3 Grade 3 Grade 2 Classification: None Present Medium Medium Exudate [name] mount: [name] Serosanguineous Serosanguineous Exudate Type: [name] red, brown red, brown Exudate Color: Distinct, outline attached Epibole [name] Wound Margin: None Present (0%) Large (67-100%) Small (1-33%) Granulation [name] mount: [name] Red, Pink Red Granulation Quality: None Present (0%) Small (1-33%) Large (67-100%) Necrotic [name] mount: [name] Adherent Slough Eschar Necrotic Tissue: Fascia: No Fat Layer (Subcutaneous  Tissue): Yes Fat Layer (Subcutaneous Tissue): Yes Exposed Structures: Fat Layer (Subcutaneous Tissue): No Fascia: No Fascia: No Tendon: No Tendon: No Tendon: No Muscle: No Muscle: No Muscle: No Joint: No Joint: No Joint: No Bone: No Bone: No Bone: No Large (67-100%) Large (67-100%) None Epithelialization: [name] Debridement - Excisional Debridement - Excisional Debridement: Pre-procedure Verification/Time Out [name] 11:35 11:35 Taken: [name] Lidocaine 5% topical ointment [name] Pain Control: [name] Subcutaneous, Slough Callus, Subcutaneous, Slough Tissue Debrided: [name] Skin/Subcutaneous Tissue Skin/Subcutaneous Tissue Level: [name] 3.36 1 Debridement [name] (sq cm): rea [name] Curette Curette Instrument: [name] Minimum Minimum Bleeding: [name] Pressure Pressure Hemostasis [name] chieved: [name] 0 0 Procedural Pain: [name] 0 0 Post Procedural Pain: [name] Procedure was tolerated well Procedure was tolerated well Debridement Treatment Response: [name] 4.8x0.7x0.3 2x0.5x1 Post Debridement Measurements L x W x D (cm) [name] 0.792 0.785 Post Debridement Volume: (cm) Excoriation: No Callus: Yes Callus: Yes Periwound Skin Texture: Induration: No Excoriation: No Callus: No Induration: No Crepitus: No Crepitus: No Rash: No Rash: No Scarring: No Scarring: No Maceration: No Maceration: No Periwound Skin Moisture: Dry/Scaly: No Dry/Scaly: No Atrophie Blanche: No Atrophie Blanche: No Periwound Skin Color: Cyanosis: No Cyanosis: No Ecchymosis: No Ecchymosis: No Erythema: No Erythema: No Hemosiderin Staining: No Hemosiderin Staining: No Mottled: No Mottled: No Pallor: No Pallor: No Rubor: No Rubor: No No Abnormality No Abnormality [name] Temperature: [name] Cellular or Tissue Based Product Debridement Procedures Performed: Debridement Treatment Notes Electronic Signature(s) Signed: 07/16/2022 12:04:27 PM By: [name] DO Entered By: [name] on 07/16/2022 11:50:09 [name]  ([id number]) [id number] Page 4 of 10 -------------------------------------------------------------------------------- Multi-Disciplinary Care Plan Details Patient Name: Date of Service: [name], [name]. 07/16/2022 10:15 [name] Medical Record Number: [id number] Patient Account Number: [id number] Date of Birth/Sex: Treating RN: 1956-04-22 (67 y.o. [name] Primary Care Mariacristina Aday: [name] Other Clinician: Referring Aniah Pauli: Treating Demetrice Amstutz/Extender: [name], [name] in Treatment: 13 Active Inactive Osteomyelitis Nursing Diagnoses: Infection: osteomyelitis Goals: Diagnostic evaluation for osteomyelitis completed as ordered Date Initiated: 04/23/2022 Target Resolution Date: 08/07/2022 Goal Status: Active Signs and symptoms for osteomyelitis will be recognized and promptly addressed Date Initiated: 04/23/2022 Target Resolution Date: 08/07/2022 Goal Status: Active Interventions: Assess for signs and symptoms of osteomyelitis resolution every visit Provide education on osteomyelitis Screen for HBO Treatment Activities: Biopsy : 04/16/2022 Consult for HBO : 04/23/2022 Surgical debridement : 04/23/2022 Systemic antibiotics : 04/23/2022 T ordered outside of clinic : 04/23/2022 est Notes: Wound/Skin Impairment Nursing Diagnoses: Knowledge deficit related to ulceration/compromised skin integrity Goals: Patient/caregiver will verbalize understanding  of skin care regimen Date Initiated: 04/14/2022 Target Resolution Date: 08/06/2022 Goal Status: Active Interventions: Assess patient/caregiver ability to perform ulcer/skin care regimen upon admission and as needed Assess ulceration(s) every visit Provide education on ulcer and skin care Notes: Electronic Signature(s) Signed: 07/21/2022 3:57:27 PM By: Sharyn Creamer RN, [name] Entered By: Sharyn Creamer on 07/16/2022 14:42:17 [name] ([id number])  [id number] Page 5 of 10 -------------------------------------------------------------------------------- Pain Assessment Details Patient Name: Date of Service: [name], [name]. 07/16/2022 10:15 [name] Medical Record Number: [id number] Patient Account Number: [id number] Date of Birth/Sex: Treating RN: [birth year] (67 y.o. [name] Primary Care Callie Bunyard: [name] Other Clinician: Referring Treavon Castilleja: Treating Astraea Gaughran/Extender: [name], [name] in Treatment: 13 Active Problems Location of Pain Severity and Description of Pain Patient Has Paino No Site Locations Pain Management and Medication Current Pain Management: Electronic Signature(s) Signed: 07/21/2022 3:57:27 PM By: Sharyn Creamer RN, [name] Entered By: Sharyn Creamer on 07/16/2022 11:23:57 -------------------------------------------------------------------------------- Patient/Caregiver Education Details Patient Name: Date of Service: [name], Vinetta Bergamo 1/11/2024andnbsp10:15 [name] Medical Record Number: [id number] Patient Account Number: [id number] Date of Birth/Gender: Treating RN: 1955/07/24 (67 y.o. [name] Primary Care Physician: [name] Other Clinician: Referring Physician: Treating Physician/Extender: [name] in Treatment: 13 Education Assessment Education Provided To: Patient Education Topics Provided Wound/Skin Impairment: Methods: Explain/Verbal Responses: State content correctly [employer]) Signed: 07/21/2022 3:57:27 PM By: Sharyn Creamer RN, [name] Entered By: Sharyn Creamer on 07/16/2022 14:42:33 [name] ([id number]) [id number] Page 6 of 10 -------------------------------------------------------------------------------- Wound Assessment Details Patient Name: Date of Service: [name], [name]. 07/16/2022 10:15 [name] Medical Record Number: [id number] Patient  Account Number: [id number] Date of Birth/Sex: Treating RN: February 06, 1956 (67 y.o. [name] Primary Care Sufian Ravi: [name] Other Clinician: Referring Daniyla Pfahler: Treating Clete Kuch/Extender: [name], [name] in Treatment: 13 Wound Status Wound Number: 4 Primary Diabetic Wound/Ulcer of the Lower Extremity Etiology: Wound Location: Left, Dorsal Foot Wound Healed - Epithelialized Wounding Event: Gradually Appeared Status: Date Acquired: 09/03/2021 Comorbid Asthma, Sleep Apnea, Hypertension, Peripheral Venous Disease, [name]: 13 History: Type II Diabetes, Osteomyelitis, Neuropathy Clustered Wound: No Photos Wound Measurements Length: (cm) Width: (cm) Depth: (cm) Area: (cm) Volume: (cm) 0 % Reduction in Area: 100% 0 % Reduction in Volume: 100% 0 Epithelialization: Large (67-100%) 0 Tunneling: No 0 Undermining: No Wound Description Classification: Grade 3 Wound Margin: Distinct, outline attached Exudate Amount: None Present Foul Odor After Cleansing: No Slough/Fibrino No Wound Bed Granulation Amount: None Present (0%) Exposed Structure Necrotic Amount: None Present (0%) Fascia Exposed: No Fat Layer (Subcutaneous Tissue) Exposed: No Tendon Exposed: No Muscle Exposed: No Joint Exposed: No Bone Exposed: No Periwound Skin Texture Texture Color No Abnormalities Noted: No No Abnormalities Noted: Yes Callus: No Temperature / Pain Crepitus: No Temperature: No Abnormality Excoriation: No Induration: No Rash: No Scarring: No Moisture No Abnormalities Noted: Yes Electronic Signature(s) [name], [name] ([id number]) [id number] Page 7 of 10 Signed: 07/21/2022 3:57:27 PM By: Sharyn Creamer RN, [name] Entered By: Sharyn Creamer on 07/16/2022 11:27:15 -------------------------------------------------------------------------------- Wound Assessment Details Patient Name: Date of Service: [name], [name].  07/16/2022 10:15 [name] Medical Record Number: [id number] Patient Account Number: [id number] Date of Birth/Sex: Treating RN: July 12, 1955 (67 y.o. [name] Primary Care Morgan Rennert: [name] Other Clinician: Referring Deontrae Drinkard: Treating Bina Veenstra/Extender: [name], [name] in Treatment: 13 Wound Status Wound Number: 5 Primary Diabetic Wound/Ulcer of the Lower Extremity Etiology:  Wound Location: Left, Lateral Foot Secondary Open Surgical Wound Wounding Event: Surgical Injury Etiology: Date Acquired: 12/20/2021 Wound Open [name]: 13 Status: Clustered Wound: No Comorbid Asthma, Sleep Apnea, Hypertension, Peripheral Venous Disease, History: Type II Diabetes, Osteomyelitis, Neuropathy Photos Wound Measurements Length: (cm) 4.8 Width: (cm) 0.7 Depth: (cm) 0.3 Area: (cm) 2.639 Volume: (cm) 0.792 % Reduction in Area: 73.6% % Reduction in Volume: 84.2% Epithelialization: Large (67-100%) Tunneling: No Undermining: No Wound Description Classification: Grade 3 Wound Margin: Epibole Exudate Amount: Medium Exudate Type: Serosanguineous Exudate Color: red, brown Foul Odor After Cleansing: No Slough/Fibrino Yes Wound Bed Granulation Amount: Large (67-100%) Exposed Structure Granulation Quality: Red, Pink Fascia Exposed: No Necrotic Amount: Small (1-33%) Fat Layer (Subcutaneous Tissue) Exposed: Yes Necrotic Quality: Adherent Slough Tendon Exposed: No Muscle Exposed: No Joint Exposed: No Bone Exposed: No Periwound Skin Texture Texture Color No Abnormalities Noted: No No Abnormalities Noted: Yes Callus: Yes Temperature / Pain Crepitus: No Temperature: No Abnormality Excoriation: No Induration: No Rash: No [name], [name] ([id number]) [id number] Page 8 of 10 Scarring: No Moisture No Abnormalities Noted: No Dry / Scaly: No Maceration: No Treatment Notes Wound #5 (Foot) Wound Laterality: Left,  Lateral Cleanser Wound Cleanser Discharge Instruction: Cleanse the wound with wound cleanser prior to applying [name] clean dressing using gauze sponges, not tissue or cotton balls. [name]-Wound Care Skin Prep Discharge Instruction: Use skin prep as directed Topical Primary Dressing Grafix Secondary Dressing ABD Pad, 8x10 Discharge Instruction: Apply over primary dressing as directed. Secured With [employer], 4.5x3.1 (in/yd) Discharge Instruction: Secure with Kerlix as directed. 71M Medipore H Soft Cloth Surgical T ape, 4 x 10 (in/yd) Discharge Instruction: Secure with tape as directed. Compression Wrap Compression Stockings Add-Ons Electronic Signature(s) Signed: 07/21/2022 3:57:27 PM By: Sharyn Creamer RN, [name] Entered By: Sharyn Creamer on 07/16/2022 11:28:08 -------------------------------------------------------------------------------- Wound Assessment Details Patient Name: Date of Service: [name], [name]. 07/16/2022 10:15 [name] Medical Record Number: [id number] Patient Account Number: [id number] Date of Birth/Sex: Treating RN: 10/19/1955 (67 y.o. [name] Primary Care Treshawn Allen: [name] Other Clinician: Referring Arthor Gorter: Treating [name], [name] in Treatment: 13 Wound Status Wound Number: 6 Primary Diabetic Wound/Ulcer of the Lower Extremity Etiology: Wound Location: Right Amputation Site - Transmetatarsal Wound Open Wounding Event: Gradually Appeared Status: Date Acquired: 07/16/2022 Comorbid Asthma, Sleep Apnea, Hypertension, Peripheral Venous Disease, [name]: 0 History: Type II Diabetes, Osteomyelitis, Neuropathy Clustered Wound: No Wound Measurements Length: (cm) 2.5 Width: (cm) 0.5 Depth: (cm) 1 Area: (cm) 0.982 Volume: (cm) 0.982 [name], [name] ([id number]) Wound Description Classification: Grade 2 Exudate Amount: Medium Exudate Type: Serosanguineous Exudate Color: red,  brown Foul Odor After Cleansing: No Slough/Fibrino Yes % Reduction in Area: % Reduction in Volume: Epithelialization: None Tunneling: No Undermining: No [id number] Page 9 of 10 Wound Bed Granulation Amount: Small (1-33%) Exposed Structure Granulation Quality: Red Fascia Exposed: No Necrotic Amount: Large (67-100%) Fat Layer (Subcutaneous Tissue) Exposed: Yes Necrotic Quality: Eschar Tendon Exposed: No Muscle Exposed: No Joint Exposed: No Bone Exposed: No Periwound Skin Texture Texture Color No Abnormalities Noted: No No Abnormalities Noted: No Callus: Yes Moisture No Abnormalities Noted: No Treatment Notes Wound #6 (Amputation Site - Transmetatarsal) Wound Laterality: Right Cleanser Soap and Water Discharge Instruction: May shower and wash wound with dial antibacterial soap and water prior to dressing change. [name]-Wound Care Topical Primary Dressing Dakin's Solution 0.25%, 16 (oz) Discharge Instruction: Moisten gauze with Dakin's solution Secondary Dressing Woven Gauze Sponge, Non-Sterile 4x4 in Discharge  Instruction: Apply over primary dressing as directed. Secured With [employer], 4.5x3.1 (in/yd) Discharge Instruction: Secure with Kerlix as directed. Compression Wrap Compression Stockings Add-Ons Electronic Signature(s) Signed: 07/21/2022 3:57:27 PM By: Sharyn Creamer RN, [name] Entered By: Sharyn Creamer on 07/16/2022 11:42:15 -------------------------------------------------------------------------------- Coker Details Patient Name: Date of Service: [name], [name]. 07/16/2022 10:15 [name] Medical Record Number: [id number] Patient Account Number: [id number] Date of Birth/Sex: Treating RN: [birth year] (67 y.o. [name] Primary Care Zakayla Martinec: [name] Other Clinician: Referring Juliett Eastburn: Treating Jakelyn Squyres/Extender: [name] in Treatment: [address] [place], HEIDE [name]  ([id number]) [id number] Page 10 of 10 Time Taken: 10:47 Temperature (F): 97.9 Height (in): 72 Pulse (bpm): 81 Weight (lbs): 220 Respiratory Rate (breaths/min): 18 Body Mass Index (BMI): 29.8 Blood Pressure (mmHg): 137/97 Capillary Blood Glucose (mg/dl): 139 Reference Range: 80 - 120 mg / dl Electronic Signature(s) Signed: 07/21/2022 3:57:27 PM By: Sharyn Creamer RN, [name] Entered By: Sharyn Creamer on 07/16/2022 11:23:50

## 2022-07-21 NOTE — Progress Notes (Signed)
JAYKO, VOORHEES A ([id number]) [id number] Page 1 of 1 Visit Report for 07/17/2022 SuperBill Details Patient Name: Date of Service: [name], [name] 07/17/2022 Medical Record Number: [id number] Patient Account Number: [id number] Date of Birth/Sex: Treating RN: [birth year] (67 y.o. [name] Primary Care Provider: [name] Other Clinician: [name] Referring Provider: Treating Provider/Extender: [name] in Treatment: 13 Diagnosis Coding ICD-10 Codes Code Description [phone] Non-pressure chronic ulcer of other part of left foot with fat layer exposed M86.172 Other acute osteomyelitis, left ankle and foot L97.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 specified severity E11.621 Type 2 diabetes mellitus with foot ulcer E11.42 Type 2 diabetes mellitus with diabetic polyneuropathy S91.301A Unspecified open wound, right foot, initial encounter 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 severity Facility Procedures CPT4 Code Description Modifier Quantity [id number] G0277-(Facility Use Only) HBOT full body chamber, 56mn , 5 ICD-10 Diagnosis Description E11.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 M86.172 Other acute osteomyelitis, left ankle and foot Physician Procedures Quantity CPT4 Code Description Modifier [phone] - WC PHYS HYPERBARIC OXYGEN THERAPY 1 ICD-10 Diagnosis Description E11.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

## 2022-07-21 NOTE — Progress Notes (Signed)
ARISTOTLE, LIEB A ([id number]) [id number] Page 1 of 1 Visit Report for 07/21/2022 SuperBill Details Patient Name: Date of Service: [name], [name] A. 07/21/2022 Medical Record Number: [id number] Patient Account Number: [id number] Date of Birth/Sex: Treating RN: 12/02/1955 (67 y.o. [name] Primary Care Provider: [name] Other Clinician: [name] Referring Provider: Treating Provider/Extender: [name] in Treatment: 14 Diagnosis Coding ICD-10 Codes Code Description [phone] Non-pressure chronic ulcer of other part of left foot with fat layer exposed M86.172 Other acute osteomyelitis, left ankle and foot L97.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 specified severity E11.621 Type 2 diabetes mellitus with foot ulcer E11.42 Type 2 diabetes mellitus with diabetic polyneuropathy S91.301A Unspecified open wound, right foot, initial encounter 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 severity Facility Procedures CPT4 Code Description Modifier Quantity [id number] G0277-(Facility Use Only) HBOT full body chamber, 64mn , 4 ICD-10 Diagnosis Description E11.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 M86.172 Other acute osteomyelitis, left ankle and foot Physician Procedures Quantity CPT4 Code Description Modifier [id number] - WC PHYS HYPERBARIC OXYGEN THERAPY 1 ICD-10 Diagnosis Description E11.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 M86.172 Other acute osteomyelitis, left ankle and  foot Electronic Signature(s) Signed: 07/21/2022 1:24:15 PM By: SDonavan BurnetCHT EMT BS , , Signed: 07/21/2022 3:36:41 PM By: HKalman ShanDO Entered By: SDonavan Burneton 07/21/2022 13:24:15

## 2022-07-22 ENCOUNTER — Encounter (HOSPITAL_BASED_OUTPATIENT_CLINIC_OR_DEPARTMENT_OTHER): Payer: Medicare HMO | Admitting: Physician Assistant

## 2022-07-23 ENCOUNTER — Encounter (HOSPITAL_BASED_OUTPATIENT_CLINIC_OR_DEPARTMENT_OTHER): Payer: Medicare HMO | Admitting: Internal Medicine

## 2022-07-23 DIAGNOSIS — E11621 Type 2 diabetes mellitus with foot ulcer: Secondary | ICD-10-CM | POA: Diagnosis not present

## 2022-07-23 DIAGNOSIS — L97522 Non-pressure chronic ulcer of other part of left foot with fat layer exposed: Secondary | ICD-10-CM | POA: Diagnosis not present

## 2022-07-23 DIAGNOSIS — L97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 specified severity: Secondary | ICD-10-CM | POA: Diagnosis not present

## 2022-07-23 DIAGNOSIS — M86172 Other acute osteomyelitis, left ankle and foot: Secondary | ICD-10-CM | POA: Diagnosis not present

## 2022-07-23 LAB — GLUCOSE, CAPILLARY
Glucose-Capillary: 242 mg/dL — ABNORMAL HIGH (ref 70–99)
Glucose-Capillary: 282 mg/dL — ABNORMAL HIGH (ref 70–99)

## 2022-07-23 NOTE — Progress Notes (Addendum)
[name] ([id number]) [id number] Page 1 of 2 Visit Report for 07/23/2022 HBO Details Patient Name: Date of Service: [name], [name] 07/23/2022 8:00 [name] Medical Record Number: [id number] Patient Account Number: [id number] Date of Birth/Sex: Treating RN: 10/09/1955 (67 y.o. [name], [name] Primary Care [name]: [name] Other Clinician: Valeria Batman Referring Mazin Emma: Treating Marvel Mcphillips/Extender: [name] in Treatment: 14 HBO Treatment Course Details Treatment Course Number: 1 Ordering Carlin Attridge: [name] T Treatments Ordered: otal 40 HBO Treatment Start Date: 06/01/2022 HBO Indication: Diabetic Ulcer(s) of the Lower Extremity Standard/Conservative Wound Care tried and failed greater than or equal to 30 days Wound #4 Left, Dorsal Foot , Wound #5 Left, Lateral Foot HBO Treatment Details Treatment Number: 31 Patient Type: Outpatient Chamber Type: Monoplace Chamber Serial #: [id number] Treatment Protocol: 2.5 ATA with 90 minutes oxygen, with two 5 minute air breaks Treatment Details Compression Rate Down: 2.0 psi / minute De-Compression Rate Up: 2.0 psi / minute [name] breaks and breathing ir Compress Tx Pressure periods Decompress Decompress Begins Reached (leave unused spaces Begins Ends blank) Chamber Pressure (ATA 1 2.5 2.5 2.5 2.5 2.5 - - 2.5 1 ) Clock Time (24 hr) 08:36 08:50 09:20 09:25 09:30 10:00 - - 10:30 10:42 Treatment Length: 126 (minutes) Treatment Segments: 4 Vital Signs Capillary Blood Glucose Reference Range: 80 - 120 mg / dl HBO Diabetic Blood Glucose Intervention Range: <131 mg/dl or >249 mg/dl Type: Time Vitals Blood Pulse: Respiratory Temperature: Capillary Blood Glucose Pulse Action Taken: Pressure: Rate: Glucose (mg/dl): Meter #: Oximetry (%) Taken: Pre 08:12 131/82 94 18 98 242 Post 10:49 153/80 75 18 97.8 282 Dr. Heber [place] informed of Blood sugar Treatment Response Treatment  Toleration: Well Treatment Completion Status: Treatment Completed without Adverse Event Treatment Notes The patient stated that he had [place] bacon, egg and cheese on toast with oatmeal and coffee. The patient used Afrin before the treatment. Additional Procedure Documentation Tissue Sevierity: Necrosis of bone Physician HBO Attestation: I certify that I supervised this HBO treatment in accordance with Medicare guidelines. [name] trained emergency response team is readily available per Yes hospital policies and procedures. Continue HBOT as ordered. Yes Electronic Signature(s) Signed: 07/23/2022 3:08:43 PM By: [name] DO Previous Signature: 07/23/2022 2:45:58 PM Version By: Valeria Batman EMT Entered By: [name] on 07/23/2022 15:08:10 [name] ([id number]) [id number] Page 2 of 2 -------------------------------------------------------------------------------- HBO Safety Checklist Details Patient Name: Date of Service: [name], [name] 07/23/2022 8:00 [name] Medical Record Number: [id number] Patient Account Number: [id number] Date of Birth/Sex: Treating RN: [birth year] (67 y.o. [name], [name] Primary Care Rukiya Hodgkins: [name] Other Clinician: Valeria Batman Referring Chaysen Tillman: Treating Aylen Stradford/Extender: [name] in Treatment: 14 HBO Safety Checklist Items Safety Checklist Consent Form Signed Patient voided / foley secured and emptied When did you last eato 0730 Last dose of injectable or oral agent 0645 Ostomy pouch emptied and vented if applicable NA All implantable devices assessed, documented and approved Tympanostomy tubes Intravenous access site secured and place NA Valuables secured Linens and cotton and cotton/polyester blend (less than 51% polyester) Personal oil-based products / skin lotions / body lotions removed Wigs or hairpieces removed NA Smoking or tobacco materials removed Books /  newspapers / magazines / loose paper removed Cologne, aftershave, perfume and deodorant removed Jewelry removed (may wrap wedding band) Make-up removed NA Hair care products removed Battery operated devices (external) removed Heating patches and chemical warmers removed Titanium eyewear removed NA Nail  polish cured greater than 10 hours NA Casting material cured greater than 10 hours NA Hearing aids removed NA Loose dentures or partials removed Removed by patient Prosthetics have been removed NA Patient demonstrates correct use of air break device (if applicable) Patient concerns have been addressed Patient grounding bracelet on and cord attached to chamber Specifics for Inpatients (complete in addition to above) Medication sheet sent with patient NA Intravenous medications needed or due during therapy sent with patient NA Drainage tubes (e.g. nasogastric tube or chest tube secured and vented) NA Endotracheal or Tracheotomy tube secured NA Cuff deflated of air and inflated with saline NA Airway suctioned NA Notes The safety checklist was done before the treatment was started. Electronic Signature(s) Signed: 07/23/2022 2:41:22 PM By: Valeria Batman EMT Entered By: Valeria Batman on 07/23/2022 14:41:22

## 2022-07-23 NOTE — Progress Notes (Signed)
[name] ([id number]) [id number] Page 1 of 2 Visit Report for 07/23/2022 Arrival Information Details Patient Name: Date of Service: [name], [name] 07/23/2022 8:00 [name] Medical Record Number: [id number] Patient Account Number: [id number] Date of Birth/Sex: Treating RN: [birth year] (67 y.o. [name], [name] Primary Care Brigitte Soderberg: [name] Other Clinician: Valeria Batman Referring Iliza Blankenbeckler: Treating Douglass Dunshee/Extender: [name] in Treatment: 14 Visit Information History Since Last Visit All ordered tests and consults were completed: Yes Patient Arrived: Wheel Chair Added or deleted any medications: No Arrival Time: 08:05 Any new allergies or adverse reactions: No Accompanied By: None Had [name] fall or experienced change in No Transfer Assistance: None activities of daily living that may affect Patient Identification Verified: Yes risk of falls: Secondary Verification Process Completed: Yes Signs or symptoms of abuse/neglect since last visito No Patient Requires Transmission-Based Precautions: No Hospitalized since last visit: No Patient Has Alerts: Yes Implantable device outside of the clinic excluding No Patient Alerts: ESBL;MRSA cellular tissue based products placed in the center since last visit: Pain Present Now: No Electronic Signature(s) Signed: 07/23/2022 2:38:23 PM By: Valeria Batman EMT Entered By: Valeria Batman on 07/23/2022 14:38:23 -------------------------------------------------------------------------------- Encounter Discharge Information Details Patient Name: Date of Service: [name], [name] 07/23/2022 8:00 [name] Medical Record Number: [id number] Patient Account Number: [id number] Date of Birth/Sex: Treating RN: 1955-08-29 (67 y.o. [name], [name] Primary Care [name]: [name] Other Clinician: Valeria Batman Referring [name]: Treating Haze Antillon/Extender: [name] in Treatment: 14 Encounter Discharge Information Items Discharge Condition: Stable Ambulatory Status: Wheelchair Discharge Destination: Home Transportation: Private Auto Accompanied By: None Schedule Follow-up Appointment: Yes Clinical Summary of Care: Electronic Signature(s) Signed: 07/23/2022 2:47:14 PM By: Valeria Batman EMT Entered By: Valeria Batman on 07/23/2022 14:47:13 [name] ([id number]) [id number] Page 2 of 2 -------------------------------------------------------------------------------- Vitals Details Patient Name: Date of Service: [name], [name] 07/23/2022 8:00 [name] Medical Record Number: [id number] Patient Account Number: [id number] Date of Birth/Sex: Treating RN: March 09, 1956 (67 y.o. [name] Primary Care Joelie Schou: [name] Other Clinician: Valeria Batman Referring [name]: Treating Burch Marchuk/Extender: [name] in Treatment: 14 Vital Signs Time Taken: 08:12 Temperature (F): 98.0 Height (in): 72 Pulse (bpm): 94 Weight (lbs): 220 Respiratory Rate (breaths/min): 18 Body Mass Index (BMI): 29.8 Blood Pressure (mmHg): 131/82 Capillary Blood Glucose (mg/dl): 242 Reference Range: 80 - 120 mg / dl Electronic Signature(s) Signed: 07/23/2022 2:38:56 PM By: Valeria Batman EMT Entered By: Valeria Batman on 07/23/2022 14:38:55

## 2022-07-23 NOTE — Progress Notes (Signed)
AHNAF, CAPONI A ([id number]) [id number] Page 1 of 2 Visit Report for 07/23/2022 Problem List Details Patient Name: Date of Service: [name], [name] 07/23/2022 8:00 A M Medical Record Number: [id number] Patient Account Number: [id number] Date of Birth/Sex: Treating RN: 01-02-1956 (67 y.o. 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/14/2022 No Yes exposed M86.172 Other acute osteomyelitis, left ankle and foot 05/08/2022 No Yes L97.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 04/14/2022 No Yes specified severity E11.621 Type 2 diabetes mellitus with foot ulcer 04/14/2022 No Yes E11.42 Type 2 diabetes mellitus with diabetic polyneuropathy 04/14/2022 No Yes S91.301A Unspecified open wound, right foot, initial encounter 07/16/2022 No Yes 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 07/23/2022 Medical Record Number: [id number] Patient Account Number: [id number] Date of Birth/Sex: Treating RN: [birth year] (67 y.o. 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 specified severity E11.621 Type 2 diabetes mellitus with foot ulcer E11.42 Type 2 diabetes mellitus with diabetic polyneuropathy S91.301A Unspecified open wound, right foot, initial encounter 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 severity Facility Procedures CPT4 Code Description Modifier Quantity [id number] G0277-(Facility Use Only) HBOT full body chamber, 24mn , 4 ICD-10 Diagnosis Description E11.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 M86.172 Other acute osteomyelitis, left ankle and foot Physician Procedures Quantity CPT4 Code Description Modifier [phone] - WC PHYS HYPERBARIC OXYGEN THERAPY 1 ICD-10 Diagnosis Description E11.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

## 2022-07-24 ENCOUNTER — Encounter (HOSPITAL_BASED_OUTPATIENT_CLINIC_OR_DEPARTMENT_OTHER): Payer: Medicare HMO | Admitting: Internal Medicine

## 2022-07-24 DIAGNOSIS — M86172 Other acute osteomyelitis, left ankle and foot: Secondary | ICD-10-CM

## 2022-07-24 DIAGNOSIS — L97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 specified severity: Secondary | ICD-10-CM

## 2022-07-24 DIAGNOSIS — L97522 Non-pressure chronic ulcer of other part of left foot with fat layer exposed: Secondary | ICD-10-CM

## 2022-07-24 DIAGNOSIS — S91301A Unspecified open wound, right foot, initial encounter: Secondary | ICD-10-CM | POA: Diagnosis not present

## 2022-07-24 DIAGNOSIS — E11621 Type 2 diabetes mellitus with foot ulcer: Secondary | ICD-10-CM | POA: Diagnosis not present

## 2022-07-24 LAB — GLUCOSE, CAPILLARY
Glucose-Capillary: 162 mg/dL — ABNORMAL HIGH (ref 70–99)
Glucose-Capillary: 85 mg/dL (ref 70–99)

## 2022-07-24 NOTE — Progress Notes (Signed)
SIRE, POET A ([id number]) [id number] Page 1 of 2 Visit Report for 07/24/2022 Problem List Details Patient Name: Date of Service: [name], [name] 07/24/2022 8:00 A M Medical Record Number: [id number] Patient Account Number: [id number] Date of Birth/Sex: Treating RN: 1955/07/28 (67 y.o. 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/14/2022 No Yes exposed M86.172 Other acute osteomyelitis, left ankle and foot 05/08/2022 No Yes L97.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 04/14/2022 No Yes specified severity E11.621 Type 2 diabetes mellitus with foot ulcer 04/14/2022 No Yes E11.42 Type 2 diabetes mellitus with diabetic polyneuropathy 04/14/2022 No Yes S91.301A Unspecified open wound, right foot, initial encounter 07/16/2022 No Yes 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 07/24/2022 Medical Record Number: [id number] Patient Account Number: [id number] Date of Birth/Sex: Treating RN: Feb 09, 1956 (67 y.o. 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 specified severity E11.621 Type 2 diabetes mellitus with foot ulcer E11.42 Type 2 diabetes mellitus with diabetic polyneuropathy S91.301A Unspecified open wound, right foot, initial encounter 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 severity Facility Procedures CPT4 Code Description Modifier Quantity [id number] G0277-(Facility Use Only) HBOT full body chamber, 66mn , 4 ICD-10 Diagnosis Description E11.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 M86.172 Other acute osteomyelitis, left ankle and foot Physician Procedures Quantity CPT4 Code Description Modifier [id number] - WC PHYS HYPERBARIC OXYGEN THERAPY 1 ICD-10 Diagnosis Description E11.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

## 2022-07-24 NOTE — Progress Notes (Addendum)
[name], [name] ([id number]) [id number] Page 1 of 2 Visit Report for 07/24/2022 Arrival Information Details Patient Name: Date of Service: [name], [name] 07/24/2022 8:00 [name] Medical Record Number: [id number] Patient Account Number: [id number] Date of Birth/Sex: Treating RN: 18-Sep-1955 (67 y.o. [name], [name] Primary Care Mckaylie Vasey: [name] Other Clinician: Valeria Batman Referring Aashi Derrington: Treating Wilhelmine Krogstad/Extender: [name] in Treatment: 14 Visit Information History Since Last Visit All ordered tests and consults were completed: Yes Patient Arrived: Wheel Chair Added or deleted any medications: No Arrival Time: 07:43 Any new allergies or adverse reactions: No Accompanied By: None Had [name] fall or experienced change in No Transfer Assistance: None activities of daily living that may affect Patient Identification Verified: Yes risk of falls: Secondary Verification Process Completed: Yes Signs or symptoms of abuse/neglect since last visito No Patient Requires Transmission-Based Precautions: No Hospitalized since last visit: No Patient Has Alerts: Yes Implantable device outside of the clinic excluding No Patient Alerts: ESBL;MRSA cellular tissue based products placed in the center since last visit: Pain Present Now: No Electronic Signature(s) Signed: 07/24/2022 10:39:19 AM By: Valeria Batman EMT Entered By: Valeria Batman on 07/24/2022 10:39:19 -------------------------------------------------------------------------------- Encounter Discharge Information Details Patient Name: Date of Service: [name], [name] 07/24/2022 8:00 [name] Medical Record Number: [id number] Patient Account Number: [id number] Date of Birth/Sex: Treating RN: [birth year] (67 y.o. [name], [name] Primary Care Korie Streat: [name] Other Clinician: Valeria Batman Referring Shadonna Benedick: Treating Freddi Forster/Extender: [name] in Treatment: 14 Encounter Discharge Information Items Discharge Condition: Stable Ambulatory Status: Wheelchair Discharge Destination: Other (Note Required) Transportation: Other Accompanied By: None Schedule Follow-up Appointment: Yes Clinical Summary of Care: Notes The patient had [facility] visit after treatment today. Electronic Signature(s) Signed: 07/24/2022 10:48:31 AM By: Valeria Batman EMT Entered By: Valeria Batman on 07/24/2022 10:48:31 [name] ([id number]) [id number] Page 2 of 2 -------------------------------------------------------------------------------- Vitals Details Patient Name: Date of Service: [name], [name] 07/24/2022 8:00 [name] Medical Record Number: [id number] Patient Account Number: [id number] Date of Birth/Sex: Treating RN: [birth year] (67 y.o. [name], [name] Primary Care Namir Neto: [name] Other Clinician: Valeria Batman Referring Fintan Grater: Treating Meridee Branum/Extender: [name], [name] in Treatment: 14 Vital Signs Time Taken: 08:04 Temperature (F): 97.5 Height (in): 72 Pulse (bpm): 97 Weight (lbs): 220 Respiratory Rate (breaths/min): 16 Body Mass Index (BMI): 29.8 Blood Pressure (mmHg): 139/79 Capillary Blood Glucose (mg/dl): 162 Reference Range: 80 - 120 mg / dl Electronic Signature(s) Signed: 07/24/2022 10:39:48 AM By: Valeria Batman EMT Entered By: Valeria Batman on 07/24/2022 10:39:47

## 2022-07-24 NOTE — Progress Notes (Addendum)
KESSLER, SOLLY A ([id number]) [id number] Page 1 of 2 Visit Report for 07/24/2022 HBO Details Patient Name: Date of Service: [name], [name] 07/24/2022 8:00 A M Medical Record Number: [id number] Patient Account Number: [id number] Date of Birth/Sex: Treating RN: [birth year] (67 y.o. [name], [place] Primary Care Laketta Soderberg: [name] Other Clinician: Valeria Batman Referring Josely Moffat: Treating Arlis Everly/Extender: [name] in Treatment: 14 HBO Treatment Course Details Treatment Course Number: 1 Ordering Lindzie Boxx: [name] T Treatments Ordered: otal 40 HBO Treatment Start Date: 06/01/2022 HBO Indication: Diabetic Ulcer(s) of the Lower Extremity Standard/Conservative Wound Care tried and failed greater than or equal to 30 days Wound #4 Left, Dorsal Foot , Wound #5 Left, Lateral Foot HBO Treatment Details Treatment Number: 32 Patient Type: Outpatient Chamber Type: Monoplace Chamber Serial #: [id number] Treatment Protocol: 2.5 ATA with 90 minutes oxygen, with two 5 minute air breaks Treatment Details Compression Rate Down: 2.0 psi / minute De-Compression Rate Up: 2.0 psi / minute A breaks and breathing ir Compress Tx Pressure periods Decompress Decompress Begins Reached (leave unused spaces Begins Ends blank) Chamber Pressure (ATA 1 2.5 2.5 2.5 2.5 2.5 - - 2.5 1 ) Clock Time (24 hr) 08:09 08:22 08:52 08:57 09:27 09:32 - - 10:02 10:13 Treatment Length: 124 (minutes) Treatment Segments: 4 Vital Signs Capillary Blood Glucose Reference Range: 80 - 120 mg / dl HBO Diabetic Blood Glucose Intervention Range: <131 mg/dl or >249 mg/dl Type: Time Vitals Blood Pulse: Respiratory Capillary Blood Glucose Pulse Action Temperature: Taken: Pressure: Rate: Glucose (mg/dl): Meter #: Oximetry (%) Taken: Pre 08:04 139/79 97 16 97.5 162 Post 10:19 148/80 78 18 97.5 85 Patient given 8ozGlucerna. Treatment Response Treatment  Toleration: Well Treatment Completion Status: Treatment Completed without Adverse Event Treatment Notes The patient stated that he had [place] bacon, egg and cheese on toast with orange juice. The patient used Afrin before the treatment. The patient was given 8 oz of Glucerna to drink, after treatment today. The patient had a [facility] visit after treatment. The patient was pushed to the [facility] in wheelchair. [name] informed of the patient blood sugar and that the patient was given Glucerna. The patient asked for another Glucerna and was given another to drink. Additional Procedure Documentation Tissue Sevierity: Necrosis of bone Electronic Signature(s) Signed: 07/24/2022 10:46:48 AM By: Valeria Batman EMT Signed: 07/24/2022 11:32:06 AM By: [name] DO Entered By: Valeria Batman on 07/24/2022 10:46:48 [name] ([id number]) [id number] Page 2 of 2 -------------------------------------------------------------------------------- HBO Safety Checklist Details Patient Name: Date of Service: [name], [name] 07/24/2022 8:00 A M Medical Record Number: [id number] Patient Account Number: [id number] Date of Birth/Sex: Treating RN: 1956/04/01 (67 y.o. [name], [name] Primary Care Levar Fayson: [name] Other Clinician: Valeria Batman Referring Chevette Fee: Treating Hasheem Voland/Extender: [name] in Treatment: 14 HBO Safety Checklist Items Safety Checklist Consent Form Signed Patient voided / foley secured and emptied When did you last eato 0715 Last dose of injectable or oral agent 0630 Ostomy pouch emptied and vented if applicable NA All implantable devices assessed, documented and approved Tympanostomy tubes Intravenous access site secured and place NA Valuables secured Linens and cotton and cotton/polyester blend (less than 51% polyester) Personal oil-based products / skin lotions / body lotions  removed Wigs or hairpieces removed NA Smoking or tobacco materials removed Books / newspapers / magazines / loose paper removed Cologne, aftershave, perfume and deodorant removed Jewelry removed (may wrap wedding band) Make-up removed NA Hair  care products removed Battery operated devices (external) removed Heating patches and chemical warmers removed Titanium eyewear removed NA Nail polish cured greater than 10 hours NA Casting material cured greater than 10 hours NA Hearing aids removed NA Loose dentures or partials removed removed by patient Prosthetics have been removed NA Patient demonstrates correct use of air break device (if applicable) Patient concerns have been addressed Patient grounding bracelet on and cord attached to chamber Specifics for Inpatients (complete in addition to above) Medication sheet sent with patient NA Intravenous medications needed or due during therapy sent with patient NA Drainage tubes (e.g. nasogastric tube or chest tube secured and vented) NA Endotracheal or Tracheotomy tube secured NA Cuff deflated of air and inflated with saline NA Airway suctioned NA Notes The safety checklist was done before the treatment was started. Electronic Signature(s) Signed: 07/24/2022 10:40:57 AM By: Valeria Batman EMT Entered By: Valeria Batman on 07/24/2022 10:40:57

## 2022-07-27 ENCOUNTER — Encounter (HOSPITAL_BASED_OUTPATIENT_CLINIC_OR_DEPARTMENT_OTHER): Payer: Medicare HMO | Admitting: Internal Medicine

## 2022-07-27 DIAGNOSIS — L97522 Non-pressure chronic ulcer of other part of left foot with fat layer exposed: Secondary | ICD-10-CM | POA: Diagnosis not present

## 2022-07-27 DIAGNOSIS — E11621 Type 2 diabetes mellitus with foot ulcer: Secondary | ICD-10-CM | POA: Diagnosis not present

## 2022-07-27 DIAGNOSIS — M86172 Other acute osteomyelitis, left ankle and foot: Secondary | ICD-10-CM

## 2022-07-27 DIAGNOSIS — L97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 specified severity: Secondary | ICD-10-CM

## 2022-07-27 LAB — GLUCOSE, CAPILLARY
Glucose-Capillary: 243 mg/dL — ABNORMAL HIGH (ref 70–99)
Glucose-Capillary: 253 mg/dL — ABNORMAL HIGH (ref 70–99)

## 2022-07-27 NOTE — Progress Notes (Addendum)
[name] ([id number]) [id number] Page 1 of 2 Visit Report for 07/27/2022 HBO Details Patient Name: Date of Service: [name], [name] 07/27/2022 8:00 [name] Medical Record Number: [id number] Patient Account Number: [id number] Date of Birth/Sex: Treating RN: 13-Dec-1955 (67 y.o. [name], [name] Primary Care Verdon Ferrante: [name] Other Clinician: Valeria Batman Referring Jumana Paccione: Treating Marvens Hollars/Extender: [name] in Treatment: 14 HBO Treatment Course Details Treatment Course Number: 1 Ordering Rosangela Fehrenbach: [name] T Treatments Ordered: otal 80 HBO Treatment Start Date: 06/01/2022 HBO Indication: Diabetic Ulcer(s) of the Lower Extremity Standard/Conservative Wound Care tried and failed greater than or equal to 30 days HBO Treatment Details Treatment Number: 33 Patient Type: Outpatient Chamber Type: Monoplace Chamber Serial #: [id number] Treatment Protocol: 2.5 ATA with 90 minutes oxygen, with two 5 minute air breaks Treatment Details Compression Rate Down: 2.0 psi / minute De-Compression Rate Up: 2.0 psi / minute [name] breaks and breathing ir Compress Tx Pressure periods Decompress Decompress Begins Reached (leave unused spaces Begins Ends blank) Chamber Pressure (ATA 1 2.5 2.5 2.5 2.5 2.5 - - 2.5 1 ) Clock Time (24 hr) 08:12 08:26 08:56 09:01 09:31 09:36 - - 10:06 10:18 Treatment Length: 126 (minutes) Treatment Segments: 4 Vital Signs Capillary Blood Glucose Reference Range: 80 - 120 mg / dl HBO Diabetic Blood Glucose Intervention Range: <131 mg/dl or >249 mg/dl Time Vitals Blood Respiratory Capillary Blood Glucose Pulse Action Type: Pulse: Temperature: Taken: Pressure: Rate: Glucose (mg/dl): Meter #: Oximetry (%) Taken: Pre 07:48 243 Post 10:20 145/89 75 18 97.4 253 Pre 08:02 154/77 90 18 97.3 Treatment Response Treatment Toleration: Well Treatment Completion Status: Treatment Completed without  Adverse Event Treatment Notes The patient stated that he had egg and cheese bagel with orange juice before coming in for treatment today. Additional Procedure Documentation Tissue Sevierity: Necrosis of bone Physician HBO Attestation: I certify that I supervised this HBO treatment in accordance with Medicare guidelines. [name] trained emergency response team is readily available per Yes hospital policies and procedures. Continue HBOT as ordered. Yes Electronic Signature(s) Signed: 07/28/2022 10:49:17 AM By: [name] DO Previous Signature: 07/27/2022 1:35:38 PM Version By: Valeria Batman EMT Entered By: [name] on 07/27/2022 16:16:57 [name] ([id number]) [id number] Page 2 of 2 -------------------------------------------------------------------------------- HBO Safety Checklist Details Patient Name: Date of Service: [name], [name] 07/27/2022 8:00 [name] Medical Record Number: [id number] Patient Account Number: [id number] Date of Birth/Sex: Treating RN: [birth year] (67 y.o. [name], [name] Primary Care Severiano Utsey: [name] Other Clinician: Valeria Batman Referring Jalah Warmuth: Treating Xylon Croom/Extender: [name] in Treatment: 14 HBO Safety Checklist Items Safety Checklist Consent Form Signed Patient voided / foley secured and emptied When did you last eato 0715 Last dose of injectable or oral agent 0645 Ostomy pouch emptied and vented if applicable NA All implantable devices assessed, documented and approved NA Intravenous access site secured and place NA Valuables secured Linens and cotton and cotton/polyester blend (less than 51% polyester) Personal oil-based products / skin lotions / body lotions removed Wigs or hairpieces removed NA Smoking or tobacco materials removed Books / newspapers / magazines / loose paper removed Cologne, aftershave, perfume and deodorant removed Jewelry removed (may wrap  wedding band) Make-up removed NA Hair care products removed Battery operated devices (external) removed Heating patches and chemical warmers removed Titanium eyewear removed NA Nail polish cured greater than 10 hours NA Casting material cured greater than 10 hours NA Hearing aids removed NA Loose dentures  or partials removed Removed by patient Prosthetics have been removed NA Patient demonstrates correct use of air break device (if applicable) Patient concerns have been addressed Patient grounding bracelet on and cord attached to chamber Specifics for Inpatients (complete in addition to above) Medication sheet sent with patient NA Intravenous medications needed or due during therapy sent with patient NA Drainage tubes (e.g. nasogastric tube or chest tube secured and vented) NA Endotracheal or Tracheotomy tube secured NA Cuff deflated of air and inflated with saline NA Airway suctioned NA Notes The safety checklist was done before the treatment was started. Electronic Signature(s) Signed: 07/27/2022 1:30:24 PM By: Valeria Batman EMT Entered By: Valeria Batman on 07/27/2022 13:30:23

## 2022-07-27 NOTE — Progress Notes (Addendum)
ROBSON, TRICKEY A ([id number]) [id number] Page 1 of 2 Visit Report for 07/27/2022 Arrival Information Details Patient Name: Date of Service: [name], [name] 07/27/2022 8:00 A M Medical Record Number: [id number] Patient Account Number: [id number] Date of Birth/Sex: Treating RN: 15-Mar-1956 (67 y.o. [name], [name] Primary Care Dyane Broberg: [name] Other Clinician: Valeria Batman Referring Demonica Farrey: Treating Neil Brickell/Extender: [name] in Treatment: 14 Visit Information History Since Last Visit All ordered tests and consults were completed: Yes Patient Arrived: Wheel Chair Added or deleted any medications: No Arrival Time: 07:45 Any new allergies or adverse reactions: No Accompanied By: None Had a fall or experienced change in No Transfer Assistance: EasyPivot Patient Lift activities of daily living that may affect Patient Identification Verified: Yes risk of falls: Secondary Verification Process Completed: Yes Signs or symptoms of abuse/neglect since last visito No Patient Requires Transmission-Based Precautions: No Hospitalized since last visit: No Patient Has Alerts: Yes Implantable device outside of the clinic excluding No Patient Alerts: ESBL;MRSA cellular tissue based products placed in the center since last visit: Pain Present Now: No Electronic Signature(s) Signed: 07/27/2022 1:28:37 PM By: Valeria Batman EMT Entered By: Valeria Batman on 07/27/2022 13:28:37 -------------------------------------------------------------------------------- Encounter Discharge Information Details Patient Name: Date of Service: [name], [name] 07/27/2022 8:00 A M Medical Record Number: [id number] Patient Account Number: [id number] Date of Birth/Sex: Treating RN: 1955-09-15 (67 y.o. [name], [name] Primary Care Jalon Blackwelder: [name] Other Clinician: Valeria Batman Referring Rayley Gao: Treating Lemmie Vanlanen/Extender: [name] in Treatment: 14 Encounter Discharge Information Items Discharge Condition: Stable Ambulatory Status: Wheelchair Discharge Destination: Home Transportation: Private Auto Accompanied By: None Schedule Follow-up Appointment: Yes Clinical Summary of Care: Electronic Signature(s) Signed: 07/27/2022 1:40:40 PM By: Valeria Batman EMT Entered By: Valeria Batman on 07/27/2022 13:40:40 [name] ([id number]) [id number].pdf Page 2 of 2 -------------------------------------------------------------------------------- Vitals Details Patient Name: Date of Service: [name], [name] 07/27/2022 8:00 A M Medical Record Number: [id number] Patient Account Number: [id number] Date of Birth/Sex: Treating RN: January 18, 1956 (67 y.o. [name] Primary Care Cira Deyoe: [name] Other Clinician: Valeria Batman Referring Cleota Pellerito: Treating Abbegale Stehle/Extender: [name], [name] in Treatment: 14 Vital Signs Time Taken: 07:48 Capillary Blood Glucose (mg/dl): 243 Height (in): 72 Reference Range: 80 - 120 mg / dl Weight (lbs): 220 Body Mass Index (BMI): 29.8 Electronic Signature(s) Signed: 07/27/2022 1:28:52 PM By: Valeria Batman EMT Entered By: Valeria Batman on 07/27/2022 13:28:52

## 2022-07-28 ENCOUNTER — Encounter (HOSPITAL_BASED_OUTPATIENT_CLINIC_OR_DEPARTMENT_OTHER): Payer: Medicare HMO | Admitting: Internal Medicine

## 2022-07-28 DIAGNOSIS — E11621 Type 2 diabetes mellitus with foot ulcer: Secondary | ICD-10-CM

## 2022-07-28 DIAGNOSIS — M86172 Other acute osteomyelitis, left ankle and foot: Secondary | ICD-10-CM

## 2022-07-28 DIAGNOSIS — L97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 specified severity: Secondary | ICD-10-CM

## 2022-07-28 DIAGNOSIS — L97522 Non-pressure chronic ulcer of other part of left foot with fat layer exposed: Secondary | ICD-10-CM | POA: Diagnosis not present

## 2022-07-28 LAB — GLUCOSE, CAPILLARY
Glucose-Capillary: 304 mg/dL — ABNORMAL HIGH (ref 70–99)
Glucose-Capillary: 182 mg/dL — ABNORMAL HIGH (ref 70–99)

## 2022-07-28 NOTE — Progress Notes (Signed)
JORRYN, CASAGRANDE [name] ([id number]) [id number] Page 1 of 2 Visit Report for 07/28/2022 Problem List Details Patient Name: Date of Service: [name], [name]. 07/28/2022 8:00 [name] Medical Record Number: [id number] Patient Account Number: [id number] Date of Birth/Sex: Treating RN: 06-23-1956 (67 y.o. 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/14/2022 No Yes exposed M86.172 Other acute osteomyelitis, left ankle and foot 05/08/2022 No Yes L97.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 04/14/2022 No Yes specified severity E11.621 Type 2 diabetes mellitus with foot ulcer 04/14/2022 No Yes E11.42 Type 2 diabetes mellitus with diabetic polyneuropathy 04/14/2022 No Yes S91.301A Unspecified open wound, right foot, initial encounter 07/16/2022 No Yes 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 07/16/2022 No Yes specified severity Inactive Problems Resolved Problems Electronic Signature(s) Signed: 07/28/2022 3:44:19 PM By: Valeria Batman EMT Signed: 07/28/2022 4:21:39 PM By: [name] DO Entered By: Valeria Batman on 07/28/2022 15:44:19 SuperBill Details -------------------------------------------------------------------------------- [name] ([id number]) [id number] Page 2 of 2 Patient Name: Date of Service: [name], [name] 07/28/2022 Medical Record Number: [id number] Patient Account Number: [id number] Date of Birth/Sex: Treating RN: 1956-01-03 (67 y.o. 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 specified severity E11.621 Type 2 diabetes mellitus with foot ulcer E11.42 Type 2 diabetes mellitus with diabetic polyneuropathy S91.301A Unspecified open wound, right foot, initial encounter 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 severity Facility Procedures CPT4 Code Description Modifier Quantity [id number]-(Facility Use Only) HBOT full body chamber, 21mn , 4 ICD-10 Diagnosis Description E11.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 M86.172 Other acute osteomyelitis, left ankle and foot Physician Procedures Quantity CPT4 Code Description Modifier [phone] - WC PHYS HYPERBARIC OXYGEN THERAPY 1 ICD-10 Diagnosis Description E11.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

## 2022-07-28 NOTE — Progress Notes (Addendum)
[name] ([id number]) [id number] Page 1 of 2 Visit Report for 07/28/2022 HBO Details Patient Name: Date of Service: [name], [name]. 07/28/2022 8:00 [name] Medical Record Number: [id number] Patient Account Number: [id number] Date of Birth/Sex: Treating RN: 06/17/1956 (67 y.o. [name] Primary Care Domenique Southers: [name] Other Clinician: Valeria Batman Referring Lisseth Brazeau: Treating Braysen Cloward/Extender: [name] in Treatment: 15 HBO Treatment Course Details Treatment Course Number: 1 Ordering Susa Bones: [name] T Treatments Ordered: otal 80 HBO Treatment Start Date: 06/01/2022 HBO Indication: Diabetic Ulcer(s) of the Lower Extremity Standard/Conservative Wound Care tried and failed greater than or equal to 30 days HBO Treatment Details Treatment Number: 34 Patient Type: Outpatient Chamber Type: Monoplace Chamber Serial #: [id number] Treatment Protocol: 2.5 ATA with 90 minutes oxygen, with two 5 minute air breaks Treatment Details Compression Rate Down: 2.0 psi / minute De-Compression Rate Up: 2.0 psi / minute [name] breaks and breathing ir Compress Tx Pressure periods Decompress Decompress Begins Reached (leave unused spaces Begins Ends blank) Chamber Pressure (ATA 1 2.5 2.5 2.5 2.5 2.5 - - 2.5 1 ) Clock Time (24 hr) 08:44 08:58 09:28 09:33 10:03 10:08 - - 10:39 10:47 Treatment Length: 123 (minutes) Treatment Segments: 4 Vital Signs Capillary Blood Glucose Reference Range: 80 - 120 mg / dl HBO Diabetic Blood Glucose Intervention Range: <131 mg/dl or >249 mg/dl Time Vitals Blood Respiratory Capillary Blood Glucose Pulse Action Type: Pulse: Temperature: Taken: Pressure: Rate: Glucose (mg/dl): Meter #: Oximetry (%) Taken: Pre 08:21 304 Post 10:57 135/84 79 16 98.1 182 Treatment Response Treatment Toleration: Well Treatment Completion Status: Treatment Completed without Adverse Event Treatment  Notes The patient stated that he had 3 eggs, [place] bacon, potatoes, toast with coffee before coming in for treatment. The patient used Afrin before treatment. Additional Procedure Documentation Tissue Sevierity: Necrosis of bone Physician HBO Attestation: I certify that I supervised this HBO treatment in accordance with Medicare guidelines. [name] trained emergency response team is readily available per Yes hospital policies and procedures. Continue HBOT as ordered. Yes Electronic Signature(s) Signed: 07/28/2022 4:21:39 PM By: [name] DO Previous Signature: 07/28/2022 3:43:37 PM Version By: Valeria Batman EMT Previous Signature: 07/28/2022 3:42:01 PM Version By: Valeria Batman EMT Entered By: [name] on 07/28/2022 15:55:15 [name] ([id number]) [id number] Page 2 of 2 -------------------------------------------------------------------------------- HBO Safety Checklist Details Patient Name: Date of Service: [name], [name]. 07/28/2022 8:00 [name] Medical Record Number: [id number] Patient Account Number: [id number] Date of Birth/Sex: Treating RN: 1955-07-13 (67 y.o. [name] Primary Care Aunica Dauphinee: [name] Other Clinician: Valeria Batman Referring Hamdi Kley: Treating Samaria Anes/Extender: [name], [name] in Treatment: 15 HBO Safety Checklist Items Safety Checklist Consent Form Signed Patient voided / foley secured and emptied When did you last eato 0700 Last dose of injectable or oral agent 0630 Ostomy pouch emptied and vented if applicable NA All implantable devices assessed, documented and approved Tympanostomy tubes Intravenous access site secured and place NA Valuables secured Linens and cotton and cotton/polyester blend (less than 51% polyester) Personal oil-based products / skin lotions / body lotions removed Wigs or hairpieces removed NA Smoking or tobacco materials removed Books / newspapers /  magazines / loose paper removed Cologne, aftershave, perfume and deodorant removed Jewelry removed (may wrap wedding band) Make-up removed NA Hair care products removed Battery operated devices (external) removed Heating patches and chemical warmers removed Titanium eyewear removed NA Nail polish cured greater than 10 hours NA Casting material cured  greater than 10 hours NA Hearing aids removed NA Loose dentures or partials removed removed by patient Prosthetics have been removed NA Patient demonstrates correct use of air break device (if applicable) Patient concerns have been addressed Patient grounding bracelet on and cord attached to chamber Specifics for Inpatients (complete in addition to above) Medication sheet sent with patient NA Intravenous medications needed or due during therapy sent with patient NA Drainage tubes (e.g. nasogastric tube or chest tube secured and vented) NA Endotracheal or Tracheotomy tube secured NA Cuff deflated of air and inflated with saline NA Airway suctioned NA Notes The safety checklist was done before the treatment was started. Electronic Signature(s) Signed: 07/28/2022 3:40:37 PM By: Valeria Batman EMT Entered By: Valeria Batman on 07/28/2022 15:40:37

## 2022-07-28 NOTE — Progress Notes (Signed)
DAHLTON, HINDE [name] ([id number]) [id number] Page 1 of 2 Visit Report for 07/27/2022 Problem List Details Patient Name: Date of Service: [name], [name]. 07/27/2022 8:00 [name] Medical Record Number: [id number] Patient Account Number: [id number] Date of Birth/Sex: Treating RN: [birth year] (67 y.o. 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/14/2022 No Yes exposed M86.172 Other acute osteomyelitis, left ankle and foot 05/08/2022 No Yes L97.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 04/14/2022 No Yes specified severity E11.621 Type 2 diabetes mellitus with foot ulcer 04/14/2022 No Yes E11.42 Type 2 diabetes mellitus with diabetic polyneuropathy 04/14/2022 No Yes S91.301A Unspecified open wound, right foot, initial encounter 07/16/2022 No Yes 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 07/16/2022 No Yes specified severity Inactive Problems Resolved Problems Electronic Signature(s) Signed: 07/27/2022 1:39:56 PM By: Valeria Batman EMT Signed: 07/28/2022 10:49:17 AM By: [name] DO Entered By: Valeria Batman on 07/27/2022 13:39:56 SuperBill Details -------------------------------------------------------------------------------- [name] ([id number]) [id number] Page 2 of 2 Patient Name: Date of Service: [name], [name] 07/27/2022 Medical Record Number: [id number] Patient Account Number: [id number] Date of Birth/Sex: Treating RN: May 02, 1956 (67 y.o. 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 specified severity E11.621 Type 2 diabetes mellitus with foot ulcer E11.42 Type 2 diabetes mellitus with diabetic polyneuropathy S91.301A Unspecified open wound, right foot, initial encounter 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 severity Facility Procedures CPT4 Code Description Modifier Quantity [id number]-(Facility Use Only) HBOT full body chamber, 18mn , 4 ICD-10 Diagnosis Description E11.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 M86.172 Other acute osteomyelitis, left ankle and foot Physician Procedures Quantity CPT4 Code Description Modifier [id number] - WC PHYS HYPERBARIC OXYGEN THERAPY 1 ICD-10 Diagnosis Description E11.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

## 2022-07-28 NOTE — Progress Notes (Signed)
[name] ([id number]) [id number] Page 1 of 2 Visit Report for 07/28/2022 Arrival Information Details Patient Name: Date of Service: [name], [name] 07/28/2022 8:00 [name] Medical Record Number: [id number] Patient Account Number: [id number] Date of Birth/Sex: Treating RN: 07-Jun-1956 (67 y.o. [name] Primary Care Tadeo Besecker: [name] Other Clinician: Valeria Batman Referring Fatumata Kashani: Treating Conswella Bruney/Extender: [name] in Treatment: 15 Visit Information History Since Last Visit All ordered tests and consults were completed: Yes Patient Arrived: Wheel Chair Added or deleted any medications: No Arrival Time: 08:18 Any new allergies or adverse reactions: No Accompanied By: None Had [name] fall or experienced change in No Transfer Assistance: None activities of daily living that may affect Patient Identification Verified: Yes risk of falls: Secondary Verification Process Completed: Yes Signs or symptoms of abuse/neglect since last visito No Patient Requires Transmission-Based Precautions: No Hospitalized since last visit: No Patient Has Alerts: Yes Implantable device outside of the clinic excluding No Patient Alerts: ESBL;MRSA cellular tissue based products placed in the center since last visit: Pain Present Now: No Electronic Signature(s) Signed: 07/28/2022 3:38:56 PM By: Valeria Batman EMT Entered By: Valeria Batman on 07/28/2022 15:38:56 -------------------------------------------------------------------------------- Encounter Discharge Information Details Patient Name: Date of Service: [name], [name] 07/28/2022 8:00 [name] Medical Record Number: [id number] Patient Account Number: [id number] Date of Birth/Sex: Treating RN: [birth year] (67 y.o. [name] Primary Care Kysha Muralles: [name] Other Clinician: Valeria Batman Referring Adian Jablonowski: Treating Joal Eakle/Extender: [name] in Treatment: 15 Encounter Discharge Information Items Discharge Condition: Stable Ambulatory Status: Wheelchair Discharge Destination: Home Transportation: Ambulance Accompanied By: None Schedule Follow-up Appointment: Yes Clinical Summary of Care: Electronic Signature(s) Signed: 07/28/2022 3:44:51 PM By: Valeria Batman EMT Entered By: Valeria Batman on 07/28/2022 15:44:51 [name] ([id number]) [phone].[id number] Page 2 of 2 -------------------------------------------------------------------------------- Vitals Details Patient Name: Date of Service: [name], [name] 07/28/2022 8:00 [name] Medical Record Number: [id number] Patient Account Number: [id number] Date of Birth/Sex: Treating RN: [birth year] (67 y.o. [name] Primary Care Marlen Koman: [name] Other Clinician: Valeria Batman Referring Shanee Batch: Treating Josaphine Shimamoto/Extender: [name], [name] in Treatment: 15 Vital Signs Time Taken: 08:21 Capillary Blood Glucose (mg/dl): 304 Height (in): 72 Reference Range: 80 - 120 mg / dl Weight (lbs): 220 Body Mass Index (BMI): 29.8 Electronic Signature(s) Signed: 07/28/2022 3:39:31 PM By: Valeria Batman EMT Entered By: Valeria Batman on 07/28/2022 15:39:31

## 2022-07-29 ENCOUNTER — Encounter (HOSPITAL_BASED_OUTPATIENT_CLINIC_OR_DEPARTMENT_OTHER): Payer: Medicare HMO | Admitting: Physician Assistant

## 2022-07-29 DIAGNOSIS — E11621 Type 2 diabetes mellitus with foot ulcer: Secondary | ICD-10-CM | POA: Diagnosis not present

## 2022-07-29 LAB — GLUCOSE, CAPILLARY
Glucose-Capillary: 273 mg/dL — ABNORMAL HIGH (ref 70–99)
Glucose-Capillary: 145 mg/dL — ABNORMAL HIGH (ref 70–99)

## 2022-07-29 NOTE — Progress Notes (Signed)
KRISTOPHER, ATTWOOD [name] ([id number]) [id number] Page 1 of 2 Visit Report for 07/29/2022 HBO Details Patient Name: Date of Service: [name], [name]. 07/29/2022 8:00 [name] Medical Record Number: [id number] Patient Account Number: [id number] Date of Birth/Sex: Treating RN: 12-07-1955 (67 y.o. [name] Primary Care Criston Chancellor: [name] Other Clinician: Valeria Batman Referring Reshawn Ostlund: Treating Jenness Stemler/Extender: [name], [name] in Treatment: 15 HBO Treatment Course Details Treatment Course Number: 1 Ordering Ercil Cassis: [name] T Treatments Ordered: otal 80 HBO Treatment Start Date: 06/01/2022 HBO Indication: Diabetic Ulcer(s) of the Lower Extremity Standard/Conservative Wound Care tried and failed greater than or equal to 30 days HBO Treatment Details Treatment Number: 35 Patient Type: Outpatient Chamber Type: Monoplace Chamber Serial #: [id number] Treatment Protocol: 2.5 ATA with 90 minutes oxygen, with two 5 minute air breaks Treatment Details Compression Rate Down: 2.0 psi / minute De-Compression Rate Up: 2.0 psi / minute [name] breaks and breathing ir Compress Tx Pressure periods Decompress Decompress Begins Reached (leave unused spaces Begins Ends blank) Chamber Pressure (ATA 1 2.5 2.5 2.5 2.5 2.5 - - 2.5 1 ) Clock Time (24 hr) 08:29 08:42 09:12 09:17 09:47 09:52 - - 10:22 10:32 Treatment Length: 123 (minutes) Treatment Segments: 4 Vital Signs Capillary Blood Glucose Reference Range: 80 - 120 mg / dl HBO Diabetic Blood Glucose Intervention Range: <131 mg/dl or >249 mg/dl Time Vitals Blood Respiratory Capillary Blood Glucose Pulse Action Type: Pulse: Temperature: Taken: Pressure: Rate: Glucose (mg/dl): Meter #: Oximetry (%) Taken: Pre 08:05 273 Pre 08:21 159/91 77 16 97.7 145 Treatment Response Treatment Toleration: Well Treatment Completion Status: Treatment Completed without Adverse Event Additional  Procedure Documentation Tissue Sevierity: Necrosis of bone Electronic Signature(s) Signed: 07/29/2022 5:23:04 PM By: Valeria Batman EMT Signed: 07/29/2022 5:48:31 PM By: Worthy Keeler PA-C Entered By: Valeria Batman on 07/29/2022 17:23:04 HBO Safety Checklist Details -------------------------------------------------------------------------------- [name] ([id number]) [id number] Page 2 of 2 Patient Name: Date of Service: [name], [name]. 07/29/2022 8:00 [name] Medical Record Number: [id number] Patient Account Number: [id number] Date of Birth/Sex: Treating RN: 1956-05-24 (67 y.o. [name] Primary Care Ewen Varnell: [name] Other Clinician: Valeria Batman Referring Ludia Gartland: Treating Fruma [place]/Extender: [name], [name] in Treatment: 15 HBO Safety Checklist Items Safety Checklist Consent Form Signed Patient voided / foley secured and emptied When did you last eato 0700 Last dose of injectable or oral agent 0630 Ostomy pouch emptied and vented if applicable NA All implantable devices assessed, documented and approved Tympanostomy tubes Intravenous access site secured and place NA Valuables secured NA Linens and cotton and cotton/polyester blend (less than 51% polyester) Personal oil-based products / skin lotions / body lotions removed Wigs or hairpieces removed Smoking or tobacco materials removed Books / newspapers / magazines / loose paper removed Cologne, aftershave, perfume and deodorant removed Jewelry removed (may wrap wedding band) Make-up removed NA Hair care products removed Battery operated devices (external) removed Heating patches and chemical warmers removed Titanium eyewear removed NA Nail polish cured greater than 10 hours NA Casting material cured greater than 10 hours NA Hearing aids removed NA Loose dentures or partials removed Removed by patient Prosthetics have been removed NA Patient  demonstrates correct use of air break device (if applicable) Patient concerns have been addressed Patient grounding bracelet on and cord attached to chamber Specifics for Inpatients (complete in addition to above) Medication sheet sent with patient NA Intravenous medications needed or due during therapy sent with patient NA  Drainage tubes (e.g. nasogastric tube or chest tube secured and vented) NA Endotracheal or Tracheotomy tube secured NA Cuff deflated of air and inflated with saline NA Airway suctioned NA Notes The safety checklist was done before the treatment was started. Electronic Signature(s) Signed: 07/29/2022 5:21:20 PM By: Valeria Batman EMT Entered By: Valeria Batman on 07/29/2022 17:21:20

## 2022-07-29 NOTE — Progress Notes (Signed)
[name], BIGNELL [name] ([id number]) [id number] Page 1 of 13 Visit Report for 07/24/2022 Chief Complaint Document Details Patient Name: Date of Service: [name], [name]. 07/24/2022 10:15 [name] Medical Record Number: [id number] Patient Account Number: [id number] Date of Birth/Sex: Treating RN: Jul 29, 1955 (67 y.o. M) Primary Care Provider: [name] Other Clinician: Referring Provider: Treating Provider/Extender: [name], [name] in Treatment: 14 Information Obtained from: Patient Chief Complaint 10/16/20; patient is here with [name] substantial wound on his right foot in the setting of [name] recent transmetatarsal amputation. 04/14/2022; referral from podiatry for postsurgical wound of nonhealing partial left fifth ray amputation, right foot (07/16/22): TMA dehiscence Electronic Signature(s) Signed: 07/24/2022 11:32:06 AM By: [name] DO Entered By: [name] on 07/24/2022 11:24:30 -------------------------------------------------------------------------------- Cellular or Tissue Based Product Details Patient Name: Date of Service: [name], [name] 07/24/2022 10:15 [name] Medical Record Number: [id number] Patient Account Number: [id number] Date of Birth/Sex: Treating RN: 10-29-1955 (67 y.o. [name], [name] Primary Care Provider: [name] Other Clinician: Referring Provider: Treating Provider/Extender: [name] in Treatment: 14 Cellular or Tissue Based Product Type Wound #5 Left,Lateral Foot Applied to: Performed By: Physician [name], DO Cellular or Tissue Based Product Type: Grafix prime Level of Consciousness (Pre-procedure): Awake and Alert Pre-procedure Verification/Time Out Yes - 11:00 Taken: Location: genitalia / hands / feet / multiple digits Wound Size (sq cm): 5 Product Size (sq cm): 6 Waste Size (sq cm): 0 Amount of Product Applied (sq cm): 6 Instrument Used: Forceps,  Scissors Lot #: [id number] Expiration Date: 11/18/2023 Fenestrated: No Reconstituted: Yes Solution Type: normal saline Solution Amount: 27m Lot #: [id number] Expiration Date: 12/03/2024 Secured: Yes Secured With: Steri-Strips Dressing Applied: Yes Primary Dressing: adaptic. gauze Procedural Pain: 0 Post Procedural Pain: 0 Response to Treatment: Procedure was tolerated well Level of Consciousness (Post- Awake and Alert TROMANO, STIGGERA ([id number] Page 2 of 13 procedure): Post Procedure Diagnosis Same as Pre-procedure Electronic Signature(s) Signed: 07/24/2022 11:32:06 AM By: HKalman ShanDO Signed: 07/29/2022 4:09:44 PM By: BRhae HammockRN Entered By: BRhae Hammockon 07/24/2022 10:58:31 -------------------------------------------------------------------------------- Debridement Details Patient Name: Date of Service: [name] [name] 07/24/2022 10:15 [name] Medical Record Number: [id number] Account Number: [id number] of Birth/Sex: Treating RN: 1Mar 24, 1957(67y.o. MBurnadette [name] Primary Care Provider: BRoselee NovaOther Clinician: Referring Provider: Treating Provider/Extender: HBurgess Amorin Treatment: 14 Debridement Performed for Assessment: Wound #5 Left,Lateral Foot Performed By: Physician HKalman Shan DO Debridement Type: Debridement Severity of Tissue Pre Debridement: Fat layer exposed Level of Consciousness (Pre-procedure): Awake and Alert Pre-procedure Verification/Time Out Yes - 10:55 Taken: Start Time: 10:55 Pain Control: Lidocaine T Area Debrided (L x W): otal 5 (cm) x 1 (cm) = 5 (cm) Tissue and other material debrided: Viable, Non-Viable, Callus, Slough, Subcutaneous, Slough Level: Skin/Subcutaneous Tissue Debridement Description: Excisional Instrument: Curette Bleeding: Minimum Hemostasis Achieved: Pressure End Time: 10:55 Procedural Pain:  0 Post Procedural Pain: 0 Response to Treatment: Procedure was tolerated well Level of Consciousness (Post- Awake and Alert procedure): Post Debridement Measurements of Total Wound Length: (cm) 5 Width: (cm) 1 Depth: (cm) 0.3 Volume: (cm) 1.178 Character of Wound/Ulcer Post Debridement: Improved Severity of Tissue Post Debridement: Fat layer exposed Post Procedure Diagnosis Same as Pre-procedure Electronic Signature(s) Signed: 07/24/2022 11:32:06 AM By: HKalman ShanDO Signed: 07/29/2022 4:09:44 PM By: BRhae HammockRN Entered By: BRhae Hammockon 07/24/2022 10:56:55 TPeri JeffersonA ([id number]) [id number] Page 3  of 13 -------------------------------------------------------------------------------- Debridement Details Patient Name: Date of Service: [name], [name]. 07/24/2022 10:15 [name] Medical Record Number: [id number] Patient Account Number: [id number] Date of Birth/Sex: Treating RN: 01-12-1956 (67 y.o. [name], [name] Primary Care Provider: [name] Other Clinician: Referring Provider: Treating Provider/Extender: [name] in Treatment: 14 Debridement Performed for Assessment: Wound #6 Right Amputation Site - Transmetatarsal Performed By: Physician [name], DO Debridement Type: Debridement Severity of Tissue Pre Debridement: Fat layer exposed Level of Consciousness (Pre-procedure): Awake and Alert Pre-procedure Verification/Time Out Yes - 10:55 Taken: Start Time: 10:55 Pain Control: Lidocaine T Area Debrided (L x W): otal 2.2 (cm) x 0.4 (cm) = 0.88 (cm) Tissue and other material debrided: Viable, Non-Viable, Callus, Slough, Subcutaneous, Slough Level: Skin/Subcutaneous Tissue Debridement Description: Excisional Instrument: Curette Bleeding: Minimum Hemostasis Achieved: Pressure End Time: 10:55 Procedural Pain: 0 Post Procedural Pain: 0 Response to Treatment: Procedure was  tolerated well Level of Consciousness (Post- Awake and Alert procedure): Post Debridement Measurements of Total Wound Length: (cm) 2.2 Width: (cm) 0.4 Depth: (cm) 1 Volume: (cm) 0.691 Character of Wound/Ulcer Post Debridement: Improved Severity of Tissue Post Debridement: Fat layer exposed Post Procedure Diagnosis Same as Pre-procedure Electronic Signature(s) Signed: 07/24/2022 11:32:06 AM By: [name] DO Signed: 07/29/2022 4:09:44 PM By: Rhae Hammock RN Entered By: Rhae Hammock on 07/24/2022 10:59:06 -------------------------------------------------------------------------------- HPI Details Patient Name: Date of Service: [name], [name] 07/24/2022 10:15 [name] Medical Record Number: [id number] Patient Account Number: [id number] Date of Birth/Sex: Treating RN: [birth year] (67 y.o. M) Primary Care Provider: [name] Other Clinician: Referring Provider: Treating Provider/Extender: [name], [name] in Treatment: 14 History of Present Illness HPI Description: ADMISSION 10/16/20 This is [name] 67 year old man who is [name] type II diabetic. Initially seen by Dr. Unk Lightning of vein and vascular on 09/10/2021 for bilateral lower extremity rest pain right greater than left. His ABIs demonstrated monophasic waveforms at the ankles bilaterally with depressed toe pressures. His ABI in the right was 0.5 and on the left 0.28. There were no obtainable waveforms for TBI's. He underwent angiography on 09/10/2021 and had findings of severe PAD with 95% stenosis of the distal SFA. He also had peroneal and posterior tibial arteries occluded with significant collateralization in the calf there was reconstitution of the posterior tibial artery in the distal third of the calf. Ostia of the anterior tibial artery was not appreciated. He underwent [name] angioplasty of the superficial femoral artery. He required admission to hospital from 09/12/2021 through 09/22/2021 with right foot  cellulitis and sepsis. On 3/13 he underwent [name] right below-knee popliteal to HOSTEEN, KIENAST [name] ([id number]) [phone].pdf Page 4 of 13 posterior tibial bypass using [name] greater saphenous vein. Ultimately however he required [name] right TMA by podiatry which I believe was done on 3/17 for progressive diabetic foot infection. He was discharged to [facility] skilled facility. He arrives in clinic today with [name] large wound area over the entirety of his amputation site extending proximally. The attempted flap closure of the amputation site and his TMA has failed and the wound extends under the attempt to closure. Still some sutures in place. There is an odor but he is not systemically unwell. He is due for follow-up arterial studies and has an appointment with vascular surgery on 10/28/2021. They are using wet-to-dry dressings at [facility]. Past medical history includes type 2 diabetes with peripheral neuropathy, hypertension, obstructive sleep apnea 4/20; patient presents for follow-up. He is being discharged from his  facility at [facility] today. They have been doing Dakin's wet-to-dry dressings. He states that his fiance can help with dressing changes. He is getting [facility] home [facility] to come out 3 times [name] week once he leaves the facility. He is scheduled to see vein and vascular on 5/12. He has not seen the wound himself. He puts covers over his face for wound exams. He currently denies systemic signs of infection. 4/28; patient admitted to the clinic 2 weeks ago with [name] dehisced right TMA in the setting of type 2 diabetes with significant PAD. He is status post revascularization. He has been discharged from the nursing home he was at and now is at home using Dakin's wet-to-dry dressings daily he has home health. He denies any systemic signs of infection 5/4; patient presents for follow-up. His fiance and home health have been changing his dressings. He currently denies systemic signs of  infection. He continues to put sheet covers over his face during our wound encounters because he does not want [name] look at the wound. 5/25; Patient presents for follow-up. He missed his last clinic appointment. He had [name] bone biopsy and culture done at last clinic visit that showed acute osteomyelitis with growth of E. coli, stenotrophomonas maltophilia and enteroccocus faecalis. [name] referral to infectious disease was made. He has not heard from them yet. He has been using Dakin's wet-to-dry dressings. He is now more comfortable with looking at the wound bed. 6/6; the patient comes into clinic today with 2 wounds on the left foot which apparently have been there for several months. This was not known to assess but was known by Dr. Donzetta Matters. In fact he underwent an angiogram yesterday. He had [name] laser atherectomy of the left posterior tibial artery with [name] 3 mm balloon angioplasty, also [name] stent of the last left SFA. 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 This is [name] lot better than the last time I saw it. He has been using Dakin's wet-to-dry. He has an appointment with infectious disease for [name] bone biopsy which showed E. coli, stenotrophomonas and Enterococcus faecalis. The appointment is for tomorrow 04/14/2022 Mr. Agustus Mane ajuddin is an uncontrolled type 2 diabetic on insulin that presents with [name] left foot ulcer. On 12/18/2021 he required [name] left fifth ray amputation. He had revision of this site on 6/17 by Dr. Posey Pronto, podiatry. He states he has had [name] wound VAC on this area for the past 3 weeks. It is unclear what the prior wound care has been. He has [name] history of osteomyelitis to this area and was treated with IV and oral antibiotics For 6 weeks by Dr. Linus Salmons of infectious disease. At his last follow-up on 02/20/2022 his CRP was trending down and  antibiotics were stopped. He also has [name] history of peripheral arterial disease status post stenting to the left SFA and balloon angioplasty to the left posterior tibial artery On 12/2021. He has follow-up with vein and vascular at the end of the month. He also has [name] wound to the dorsal aspect of the left foot that he has been placing Betadine on. 10/19; patient presents for follow-up. He had [name] bone biopsy of the left foot at last clinic visit showed osteomyelitis. The bone culture showed Enterobacter cloacae, Enterococcus faecalis, Staphylococcus aureus, viridans group strep, actinotigum schaelii. He  has been referred to infectious disease And has an appointment on October 25. He has been using Dakin's wet-to-dry dressing to the lateral left foot wound and Hydrofera Blue and Medihoney to the dorsal foot wound. He has no issues or complaints today. He denies systemic signs of infection. 10/26; patient presents for follow-up. He saw Dr. Linus Salmons yesterday with infectious disease and has been prescribed doxycycline and Levaquin for his chronic osteomyelitis. He has been using Dakin's wet-to-dry dressings to the lateral left foot wound and Medihoney and Hydrofera Blue to the dorsal wound. We discussed [name] skin graft for the dorsal foot wound and he would like to proceed with insurance verification. 11/3; patient with [name] wound on the dorsal left foot and [name] substantial postsurgical area on the lateral left foot fifth ray amputation. He is also followed by Dr. Alton Revere of infectious disease. He had [name] bone biopsy from the left lateral foot that showed acute osteomyelitis. Swab culture showed multiple organisms. He has also been revascularized by infectious disease status post left SFA stenting and tibial laser atherectomy and angioplasty. An MRI of the left foot had previously shown osteomyelitis of the fifth met head we have been using Dakin's wet-to-dry to the left lateral foot and Medihoney and Hydrofera Blue to  the wound on the dorsal foot. 11/13; HBO treatment was discussed at last clinic visit and patient would like to proceed with this. He has orientation today. He has been approved for Grafix. We have been using Hydrofera Blue and Medihoney to the left dorsal foot and collagen to the left lateral foot. Patient has home health that comes in for dressing changes. He currently has no issues or complaints today. 12/7; patient missed his last clinic appointment. He was last seen 3 weeks ago. He is currently off of antibiotics per ID. He completed [name] prolonged treatment. He has been using collagen to the lateral foot wound. He has been using Medihoney and Hydrofera Blue to the dorsal foot wound. He has been doing well with hyperbaric oxygen therapy. He has no issues or complaints today. He denies signs of of infection. 12/14; patient presents for follow-up. We have been using Grafix to the wound beds. He has had trouble equalizing the pressure in his ears during HBO treatments. It was recommended he follow-up with ENT He does not have an appointment yet. He currently denies signs of infection. . 12/21; patient presents for follow-up. He was seen by ENT and had tubes placed. He is continued HBO without issues. We have been using Grafix to the wound beds. There is been improvement in wound healing. 12/21; patient presents for follow-up. He has no issues or complaints today. There is been improvement in wound healing. 1/5; patient presents for follow-up. At last clinic visit Grafix was placed in standard fashion. He has no issues or complaints today. He is tolerating hyperbaric oxygen therapy well. He denies signs of infection. 1/11; patient presents for follow-up. We have been placing Grafix to the wound beds on the left foot. The dorsal left foot wound has healed. The left lateral foot wound has shown improvement in healing. Unfortunately has had dehiscence of his previous surgical wound site on the right  foot. He currently denies signs of infection. 1/19; patient presents for follow-up. Grafix was placed to the left foot wound at last clinic visit. He has been using Dakin's wet-to-dry dressings to the right foot wound. He continues HBO therapy without issues. He has shown improvement in wound healing to the left foot.  Electronic Signature(s) Signed: 07/24/2022 11:32:06 AM By: [name] DO Entered By: [name] on 07/24/2022 11:25:08 [name] ([id number]) [id number] Page 5 of 13 -------------------------------------------------------------------------------- Physical Exam Details Patient Name: Date of Service: [name], [name]. 07/24/2022 10:15 [name] Medical Record Number: [id number] Patient Account Number: [id number] Date of Birth/Sex: Treating RN: 12-28-1955 (67 y.o. M) Primary Care Provider: [name] Other Clinician: Referring Provider: Treating Provider/Extender: [name], [name] in Treatment: 14 Constitutional respirations regular, non-labored and within target range for patient.. Cardiovascular 2+ dorsalis pedis/posterior tibialis pulses. Psychiatric pleasant and cooperative. Notes Left foot: T the lateral aspect there is an open wound with granulation tissue and nonviable tissue. No probing to bone . Right foot: Along the previous TMA o site there is an open wound with nonviable tissue, granulation tissue and callus. There is increased depth In the center. Does not probe to bone. No signs of surrounding soft tissue infection. Electronic Signature(s) Signed: 07/24/2022 11:32:06 AM By: [name] DO Entered By: [name] on 07/24/2022 11:25:56 -------------------------------------------------------------------------------- Physician Orders Details Patient Name: Date of Service: [name], [name] 07/24/2022 10:15 [name] Medical Record Number: [id number] Patient Account Number:  [id number] Date of Birth/Sex: Treating RN: Nov 18, 1955 (67 y.o. [name], [name] Primary Care Provider: [name] Other Clinician: Referring Provider: Treating Provider/Extender: [name] in Treatment: [phone] Verbal / Phone Orders: No Diagnosis Coding Follow-up Appointments ppointment in 1 week. - W/ Dr. Heber Purdy (ALREADY HAS APPT 07/30/22 @ 8341) . Return [name] ppointment in 2 weeks. - w/ Dr. Heber Garvin Return [name] Anesthetic (In clinic) Topical Lidocaine 5% applied to wound bed Cellular or Tissue Based Products Cellular or Tissue Based Product Type: - Run IVR for Grafix for left dorsal foot- pending Grafix # 1 applied 06/11/22 Grafix # 2 applied 06/18/22 Grafix # 3 applied 06/25/22 GRafix # 4 applied 07/02/22 Grafix # 5 applied 07/10/22 Grafix # 6 applied 07/17/22 Grafix # 7 applied 07/24/22---Also, go ahead and run IVR for more Grafix for left lateral foot Cellular or Tissue Based Product applied to wound bed, secured with steri-strips, cover with Adaptic or Mepitel. (DO NOT REMOVE). Edema Control - Lymphedema / SCD / Other Avoid standing for long periods of time. Off-Loading Open toe surgical shoe to: - wear when standing or walking. DEBRA, CALABRETTA [name] ([id number]) [id number] Page 6 of 13 Hyperbaric Oxygen Therapy Evaluate for HBO Therapy - 07/24/22- Pt. to finish next 8 HBO treatments 07/24/22- DR. Leeloo Silverthorne wants pt. to be extended for 40 more treatments Indication: - Wagner Grade III to left lateral foot 2.5 ATA for 90 Minutes with 2 Five (5) Minute [name] Breaks ir Total Number of Treatments: - 40; extend for 40 more One treatments per day (delivered Monday through Friday unless otherwise specified in Special Instructions below): Finger stick Blood Glucose Pre- and Post- HBOT Treatment. Follow Hyperbaric Oxygen Glycemia Protocol Afrin (Oxymetazoline HCL) 0.05% nasal spray - 1 spray in both nostrils daily as needed prior to HBO  treatment for difficulty clearing ears Wound Treatment Wound #5 - Foot Wound Laterality: Left, Lateral Cleanser: Wound Cleanser (Generic) 1 x Per Day/30 Days Discharge Instructions: Cleanse the wound with wound cleanser prior to applying [name] clean dressing using gauze sponges, not tissue or cotton balls. Peri-Wound Care: Skin Prep (Generic) 1 x Per Day/30 Days Discharge Instructions: Use skin prep as directed Prim Dressing: Grafix (Generic) 1 x Per Day/30 Days ary Secondary Dressing: ABD Pad, 8x10 (Generic) 1 x Per Day/30 Days  Discharge Instructions: Apply over primary dressing as directed. Secured With: [employer], 4.5x3.1 (in/yd) (Generic) 1 x Per Day/30 Days Discharge Instructions: Secure with Kerlix as directed. Secured With: 8M Medipore H Soft Cloth Surgical T ape, 4 x 10 (in/yd) (Generic) 1 x Per Day/30 Days Discharge Instructions: Secure with tape as directed. Wound #6 - Amputation Site - Transmetatarsal Wound Laterality: Right Cleanser: Wound Cleanser (Generic) 1 x Per Day/30 Days Discharge Instructions: Cleanse the wound with wound cleanser prior to applying [name] clean dressing using gauze sponges, not tissue or cotton balls. Prim Dressing: Dakin's Solution 0.25%, 16 (oz) 1 x Per Day/30 Days ary Discharge Instructions: Moisten gauze with Dakin's solution Secondary Dressing: ABD Pad, 8x10 1 x Per Day/30 Days Discharge Instructions: Apply over primary dressing as directed. Secondary Dressing: Woven Gauze Sponge, Non-Sterile 4x4 in (Generic) 1 x Per Day/30 Days Discharge Instructions: Apply over primary dressing as directed. Secured With: [employer], 4.5x3.1 (in/yd) (Generic) 1 x Per Day/30 Days Discharge Instructions: Secure with Kerlix as directed. GLYCEMIA INTERVENTIONS PROTOCOL PRE-HBO GLYCEMIA INTERVENTIONS ACTION INTERVENTION Obtain pre-HBO capillary blood glucose (ensure 1 physician order is in chart). [name]. Notify HBO physician and await physician orders. 2  If result is 70 mg/dl or below: B. If the result meets the hospital definition of [name] critical result, follow hospital policy. [name]. Give patient an 8 ounce Glucerna Shake, an 8 ounce Ensure, or 8 ounces of [name] Glucerna/Ensure equivalent dietary supplement*. B. Wait 30 minutes. If result is 71 mg/dl to 130 mg/dl: C. Retest patients capillary blood glucose (CBG). D. If result greater than or equal to 110 mg/dl, proceed with HBO. If result less than 110 mg/dl, notify HBO physician and consider holding HBO. If result is 131 mg/dl to 249 mg/dl: [name]. Proceed with HBO. [name]. Notify HBO physician and await physician orders. B. It is recommended to hold HBO and do If result is 250 mg/dl or greater: blood/urine ketone testing. C. If the result meets the hospital definition of [name] critical result, follow hospital policy. POST-HBO GLYCEMIA INTERVENTIONS ACTION INTERVENTION Obtain post HBO capillary blood glucose (ensure 1 physician order is in chart). [name]. Notify HBO physician and await physician orders. 2 If result is 70 mg/dl or below: B. If the result meets the hospital definition of [name] DARIK, MASSING [name] ([id number]) [id number] Page 7 of 13 critical result, follow hospital policy. [name]. Give patient an 8 ounce Glucerna Shake, an 8 ounce Ensure, or 8 ounces of [name] Glucerna/Ensure equivalent dietary supplement*. B. Wait 15 minutes for symptoms of If result is 71 mg/dl to 100 mg/dl: hypoglycemia (i.e. nervousness, anxiety, sweating, chills, clamminess, irritability, confusion, tachycardia or dizziness). C. If patient asymptomatic, discharge patient. If patient symptomatic, repeat capillary blood glucose (CBG) and notify HBO physician. If result is 101 mg/dl to 249 mg/dl: [name]. Discharge patient. [name]. Notify HBO physician and await physician orders. B. It is recommended to do blood/urine ketone If result is 250 mg/dl or greater: testing. C. If the result meets the  hospital definition of [name] critical result, follow hospital policy. *Juice or candies are NOT equivalent products. If patient refuses the Glucerna or Ensure, please consult the hospital dietitian for an appropriate substitute. Electronic Signature(s) Signed: 07/24/2022 11:32:06 AM By: [name] DO Entered By: [name] on 07/24/2022 11:26:05 -------------------------------------------------------------------------------- Problem List Details Patient Name: Date of Service: [name], [name] 07/24/2022 10:15 [name] Medical Record Number: [id number] Patient Account Number: [id number] Date of Birth/Sex: Treating RN: 1955-10-11 (67 y.o. M) Primary  Care Provider: [name] Other Clinician: Referring Provider: Treating Provider/Extender: [name] in Treatment: 14 Active Problems ICD-10 Encounter Code Description Active Date MDM Diagnosis L97.522 Non-pressure chronic ulcer of other part of left foot with fat layer exposed 04/14/2022 No Yes M86.172 Other acute osteomyelitis, left ankle and foot 05/08/2022 No Yes L97.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 specified 04/14/2022 No Yes severity E11.621 Type 2 diabetes mellitus with foot ulcer 04/14/2022 No Yes E11.42 Type 2 diabetes mellitus with diabetic polyneuropathy 04/14/2022 No Yes S91.301A Unspecified open wound, right foot, initial encounter 07/16/2022 No Yes 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 07/16/2022 No Yes severity VI, WHITESEL [name] ([id number]) [phone].pdf Page 8 of 13 Inactive Problems Resolved Problems Electronic Signature(s) Signed: 07/24/2022 11:32:06 AM By: [name] DO Entered By: [name] on 07/24/2022 11:24:11 -------------------------------------------------------------------------------- Progress Note Details Patient Name: Date of Service: [name], [name]  [name] 07/24/2022 10:15 [name] Medical Record Number: [id number] Patient Account Number: [id number] Date of Birth/Sex: Treating RN: 1955-07-28 (67 y.o. M) Primary Care Provider: [name] Other Clinician: Referring Provider: Treating Provider/Extender: [name], [name] in Treatment: 14 Subjective Chief Complaint Information obtained from Patient 10/16/20; patient is here with [name] substantial wound on his right foot in the setting of [name] recent transmetatarsal amputation. 04/14/2022; referral from podiatry for postsurgical wound of nonhealing partial left fifth ray amputation, right foot (07/16/22): TMA dehiscence History of Present Illness (HPI) ADMISSION 10/16/20 This is [name] 67 year old man who is [name] type II diabetic. Initially seen by Dr. Unk Lightning of vein and vascular on 09/10/2021 for bilateral lower extremity rest pain right greater than left. His ABIs demonstrated monophasic waveforms at the ankles bilaterally with depressed toe pressures. His ABI in the right was 0.5 and on the left 0.28. There were no obtainable waveforms for TBI's. He underwent angiography on 09/10/2021 and had findings of severe PAD with 95% stenosis of the distal SFA. He also had peroneal and posterior tibial arteries occluded with significant collateralization in the calf there was reconstitution of the posterior tibial artery in the distal third of the calf. Ostia of the anterior tibial artery was not appreciated. He underwent [name] angioplasty of the superficial femoral artery. He required admission to hospital from 09/12/2021 through 09/22/2021 with right foot cellulitis and sepsis. On 3/13 he underwent [name] right below-knee popliteal to posterior tibial bypass using [name] greater saphenous vein. Ultimately however he required [name] right TMA by podiatry which I believe was done on 3/17 for progressive diabetic foot infection. He was discharged to [facility] skilled facility. He arrives in clinic today with [name] large wound area  over the entirety of his amputation site extending proximally. The attempted flap closure of the amputation site and his TMA has failed and the wound extends under the attempt to closure. Still some sutures in place. There is an odor but he is not systemically unwell. He is due for follow-up arterial studies and has an appointment with vascular surgery on 10/28/2021. They are using wet-to-dry dressings at [facility]. Past medical history includes type 2 diabetes with peripheral neuropathy, hypertension, obstructive sleep apnea 4/20; patient presents for follow-up. He is being discharged from his facility at [facility] today. They have been doing Dakin's wet-to-dry dressings. He states that his fiance can help with dressing changes. He is getting [facility] to come out 3 times [name] week once he leaves the facility. He is scheduled to  see vein and vascular on 5/12. He has not seen the wound himself. He puts covers over his face for wound exams. He currently denies systemic signs of infection. 4/28; patient admitted to the clinic 2 weeks ago with [name] dehisced right TMA in the setting of type 2 diabetes with significant PAD. He is status post revascularization. He has been discharged from the nursing home he was at and now is at home using Dakin's wet-to-dry dressings daily he has home health. He denies any systemic signs of infection 5/4; patient presents for follow-up. His fiance and home health have been changing his dressings. He currently denies systemic signs of infection. He continues to put sheet covers over his face during our wound encounters because he does not want [name] look at the wound. 5/25; Patient presents for follow-up. He missed his last clinic appointment. He had [name] bone biopsy and culture done at last clinic visit that showed acute osteomyelitis with growth of E. coli, stenotrophomonas maltophilia and enteroccocus faecalis. [name] referral to infectious disease was made. He has not  heard from them yet. He has been using Dakin's wet-to-dry dressings. He is now more comfortable with looking at the wound bed. 6/6; the patient comes into clinic today with 2 wounds on the left foot which apparently have been there for several months. This was not known to assess but was known by Dr. Donzetta Matters. In fact he underwent an angiogram yesterday. He had [name] laser atherectomy of the left posterior tibial artery with [name] 3 mm balloon angioplasty, also [name] stent of the last left SFA. 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 This is [name] lot better than the last time I saw it. He has been using Dakin's wet-to-dry. He has an appointment with infectious disease for [name] bone biopsy which showed E. coli, stenotrophomonas and Enterococcus faecalis. The appointment is for tomorrow 04/14/2022 Mr. Cadarius Nevares ajuddin is an uncontrolled type 2 diabetic on insulin that presents with [name] left foot ulcer. On 12/18/2021 he required [name] left fifth ray amputation. He had revision of this site on 6/17 by Dr. Posey Pronto, podiatry. He states he has had [name] wound VAC on this area for the past 3 weeks. It is unclear what the prior wound care has been. He has [name] history of osteomyelitis to this area and was treated with IV and oral antibiotics For 6 weeks by Dr. Linus Salmons of infectious disease. At his last follow-up on 02/20/2022 his CRP was trending down and antibiotics were stopped. He also has [name] history of peripheral arterial disease status post stenting to the left SFA and balloon angioplasty to the left posterior tibial artery On 12/2021. He has follow-up with vein and vascular at the end of the month. He also has [name] wound to the dorsal aspect of the left foot that he has been placing Betadine on. ELDRICK, PENICK [name] ([id number]) [phone].pdf Page 9 of 13 10/19;  patient presents for follow-up. He had [name] bone biopsy of the left foot at last clinic visit showed osteomyelitis. The bone culture showed Enterobacter cloacae, Enterococcus faecalis, Staphylococcus aureus, viridans group strep, actinotigum schaelii. He has been referred to infectious disease And has an appointment on October 25. He has been using Dakin's wet-to-dry dressing to the lateral left foot wound and Hydrofera Blue and Medihoney to the dorsal  foot wound. He has no issues or complaints today. He denies systemic signs of infection. 10/26; patient presents for follow-up. He saw Dr. Linus Salmons yesterday with infectious disease and has been prescribed doxycycline and Levaquin for his chronic osteomyelitis. He has been using Dakin's wet-to-dry dressings to the lateral left foot wound and Medihoney and Hydrofera Blue to the dorsal wound. We discussed [name] skin graft for the dorsal foot wound and he would like to proceed with insurance verification. 11/3; patient with [name] wound on the dorsal left foot and [name] substantial postsurgical area on the lateral left foot fifth ray amputation. He is also followed by Dr. Alton Revere of infectious disease. He had [name] bone biopsy from the left lateral foot that showed acute osteomyelitis. Swab culture showed multiple organisms. He has also been revascularized by infectious disease status post left SFA stenting and tibial laser atherectomy and angioplasty. An MRI of the left foot had previously shown osteomyelitis of the fifth met head we have been using Dakin's wet-to-dry to the left lateral foot and Medihoney and Hydrofera Blue to the wound on the dorsal foot. 11/13; HBO treatment was discussed at last clinic visit and patient would like to proceed with this. He has orientation today. He has been approved for Grafix. We have been using Hydrofera Blue and Medihoney to the left dorsal foot and collagen to the left lateral foot. Patient has home health that comes in for dressing  changes. He currently has no issues or complaints today. 12/7; patient missed his last clinic appointment. He was last seen 3 weeks ago. He is currently off of antibiotics per ID. He completed [name] prolonged treatment. He has been using collagen to the lateral foot wound. He has been using Medihoney and Hydrofera Blue to the dorsal foot wound. He has been doing well with hyperbaric oxygen therapy. He has no issues or complaints today. He denies signs of of infection. 12/14; patient presents for follow-up. We have been using Grafix to the wound beds. He has had trouble equalizing the pressure in his ears during HBO treatments. It was recommended he follow-up with ENT He does not have an appointment yet. He currently denies signs of infection. . 12/21; patient presents for follow-up. He was seen by ENT and had tubes placed. He is continued HBO without issues. We have been using Grafix to the wound beds. There is been improvement in wound healing. 12/21; patient presents for follow-up. He has no issues or complaints today. There is been improvement in wound healing. 1/5; patient presents for follow-up. At last clinic visit Grafix was placed in standard fashion. He has no issues or complaints today. He is tolerating hyperbaric oxygen therapy well. He denies signs of infection. 1/11; patient presents for follow-up. We have been placing Grafix to the wound beds on the left foot. The dorsal left foot wound has healed. The left lateral foot wound has shown improvement in healing. Unfortunately has had dehiscence of his previous surgical wound site on the right foot. He currently denies signs of infection. 1/19; patient presents for follow-up. Grafix was placed to the left foot wound at last clinic visit. He has been using Dakin's wet-to-dry dressings to the right foot wound. He continues HBO therapy without issues. He has shown improvement in wound healing to the left foot. Patient History Information  obtained from Patient, Chart. Family History Diabetes - Mother,Siblings, Heart Disease - Siblings, Hypertension - Siblings, Stroke - Mother, No family history of Cancer, Hereditary Spherocytosis, Kidney Disease, Lung Disease, Seizures,  Thyroid Problems, Tuberculosis. Social History Former smoker, Marital Status - Single, Alcohol Use - Never, Drug Use - No History, Caffeine Use - Daily. Medical History Eyes Denies history of Cataracts, Glaucoma, Optic Neuritis Respiratory Patient has history of Asthma, Sleep Apnea Denies history of Aspiration, Chronic Obstructive Pulmonary Disease (COPD), Pneumothorax, Tuberculosis Cardiovascular Patient has history of Hypertension, Peripheral Venous Disease Denies history of Angina, Arrhythmia, Congestive Heart Failure, Coronary Artery Disease, Deep Vein Thrombosis, Hypotension, Myocardial Infarction, Peripheral Arterial Disease, Phlebitis, Vasculitis Endocrine Patient has history of Type II Diabetes Denies history of Type I Diabetes Integumentary (Skin) Denies history of History of Burn Musculoskeletal Patient has history of Osteomyelitis Denies history of Gout, Rheumatoid Arthritis, Osteoarthritis Neurologic Patient has history of Neuropathy Denies history of Dementia, Quadriplegia, Paraplegia, Seizure Disorder Hospitalization/Surgery History - 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 Posey Pronto. - aortogram 12/08/2021 Dr. Virl Cagey [facility]. Medical [name] Surgical History Notes nd Psychiatric PTSD Objective LENUS, TRAUGER [name] ([id number]) [phone].pdf Page 10 of 13 Constitutional respirations regular, non-labored and within target range for patient.. Vitals Time Taken: 10:34 AM, Height: 72 in, Weight: 220 lbs, BMI: 29.8, Temperature: 97.5 F, Pulse: 78 bpm, Respiratory Rate: 18 breaths/min, Blood Pressure: 148/8 mmHg, Capillary Blood Glucose: 85 mg/dl. Cardiovascular 2+ dorsalis pedis/posterior tibialis  pulses. Psychiatric pleasant and cooperative. General Notes: Left foot: T the lateral aspect there is an open wound with granulation tissue and nonviable tissue. No probing to bone . Right foot: Along the o previous TMA site there is an open wound with nonviable tissue, granulation tissue and callus. There is increased depth In the center. Does not probe to bone. No signs of surrounding soft tissue infection. Integumentary (Hair, Skin) Wound #5 status is Open. Original cause of wound was Surgical Injury. The date acquired was: 12/20/2021. The wound has been in treatment 14 weeks. The wound is located on the Left,Lateral Foot. The wound measures 5cm length x 1cm width x 0.3cm depth; 3.927cm^2 area and 1.178cm^3 volume. There is Fat Layer (Subcutaneous Tissue) exposed. There is no tunneling or undermining noted. There is [name] medium amount of serosanguineous drainage noted. The wound margin is epibole. There is large (67-100%) red, pink granulation within the wound bed. There is [name] small (1-33%) amount of necrotic tissue within the wound bed including Adherent Slough. The periwound skin appearance had no abnormalities noted for color. The periwound skin appearance exhibited: Callus. The periwound skin appearance did not exhibit: Crepitus, Excoriation, Induration, Rash, Scarring, Dry/Scaly, Maceration. Periwound temperature was noted as No Abnormality. Wound #6 status is Open. Original cause of wound was Gradually Appeared. The date acquired was: 07/16/2022. The wound has been in treatment 1 weeks. The wound is located on the Right Amputation Site - Transmetatarsal. The wound measures 2.2cm length x 0.4cm width x 1cm depth; 0.691cm^2 area and 0.691cm^3 volume. There is Fat Layer (Subcutaneous Tissue) exposed. There is [name] medium amount of serosanguineous drainage noted. There is small (1-33%) red granulation within the wound bed. There is [name] large (67-100%) amount of necrotic tissue within the wound bed  including Eschar. The periwound skin appearance did not exhibit: Callus, Crepitus, Excoriation, Induration, Rash, Scarring, Dry/Scaly, Maceration, Atrophie Blanche, Cyanosis, Ecchymosis, Hemosiderin Staining, Mottled, Pallor, Rubor, Erythema. Assessment Active Problems ICD-10 Non-pressure chronic ulcer of other part of left foot with fat layer exposed Other acute osteomyelitis, left ankle and foot Non-pressure chronic ulcer of other part of left foot with other specified severity Type 2 diabetes mellitus with foot ulcer Type 2 diabetes mellitus with diabetic polyneuropathy  Unspecified open wound, right foot, initial encounter 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 severity Patient's left lower extremity wound has more granulation tissue present today. H has done very well with Grafix and HBO therapy. It appears well-healing. I debrided nonviable tissue today and Grafix was placed in standard fashion here. I recommended continuing to offload the area with [name] surgical shoe. I did an extensive debridement to the right foot wound. There is an open wound with increase in depth but does not probe to the bone. I recommended continuing Dakin's wet-to-dry dressings here. No signs of infection. Continue offloading the area with surgical shoe. He has benefited greatly from HBO therapy and I recommended continuing this. Procedures Wound #5 Pre-procedure diagnosis of Wound #5 is [name] Diabetic Wound/Ulcer of the Lower Extremity located on the Left,Lateral Foot .Severity of Tissue Pre Debridement is: Fat layer exposed. There was [name] Excisional Skin/Subcutaneous Tissue Debridement with [name] total area of 5 sq cm performed by [name], DO. With the following instrument(s): Curette to remove Viable and Non-Viable tissue/material. Material removed includes Callus, Subcutaneous Tissue, and Slough after achieving pain control using Lidocaine. No specimens were taken. [name] time out  was conducted at 10:55, prior to the start of the procedure. [name] Minimum amount of bleeding was controlled with Pressure. The procedure was tolerated well with [name] pain level of 0 throughout and [name] pain level of 0 following the procedure. Post Debridement Measurements: 5cm length x 1cm width x 0.3cm depth; 1.178cm^3 volume. Character of Wound/Ulcer Post Debridement is improved. Severity of Tissue Post Debridement is: Fat layer exposed. Post procedure Diagnosis Wound #5: Same as Pre-Procedure Pre-procedure diagnosis of Wound #5 is [name] Diabetic Wound/Ulcer of the Lower Extremity located on the Left,Lateral Foot. [name] skin graft procedure using [name] bioengineered skin substitute/cellular or tissue based product was performed by [name], DO with the following instrument(s): Forceps and Scissors. Grafix prime was applied and secured with Steri-Strips. 6 sq cm of product was utilized and 0 sq cm was wasted. Post Application, adaptic. gauze was applied. [name] Time Out was conducted at 11:00, prior to the start of the procedure. The procedure was tolerated well with [name] pain level of 0 throughout and [name] pain level of 0 following the procedure. Post procedure Diagnosis Wound #5: Same as Pre-Procedure . 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 There was [name] Excisional Skin/Subcutaneous Tissue Debridement with [name] total area of 0.88 sq cm performed by [name], DO. With the following instrument(s): Curette to remove Viable and Non-Viable tissue/material. Material removed includes Callus, Subcutaneous Tissue, and Slough after achieving pain control using Lidocaine. No specimens were taken. [name] time out was conducted at 10:55, prior to the start MAXIE, SLOVACEK [name] ([id number]) [id number] Page 11 of 13 of the procedure. [name] Minimum  amount of bleeding was controlled with Pressure. The procedure was tolerated well with [name] pain level of 0 throughout and [name] pain level of 0 following the procedure. Post Debridement Measurements: 2.2cm length x 0.4cm width x 1cm depth; 0.691cm^3 volume. Character of Wound/Ulcer Post Debridement is improved. Severity of Tissue Post Debridement is: Fat layer exposed. Post procedure Diagnosis Wound #6: Same as Pre-Procedure Plan Follow-up Appointments: Return Appointment in 1 week. - W/ Dr. Heber [place] (ALREADY HAS APPT 07/30/22 @ 1884) . Return Appointment in 2 weeks. - w/ Dr. Heber [place]  Anesthetic: (In clinic) Topical Lidocaine 5% applied to wound bed Cellular or Tissue Based Products: Cellular or Tissue Based Product Type: - Run IVR for Grafix for left dorsal foot- pending Grafix # 1 applied 06/11/22 Grafix # 2 applied 06/18/22 Grafix # 3 applied 06/25/22 GRafix # 4 applied 07/02/22 Grafix # 5 applied 07/10/22 Grafix # 6 applied 07/17/22 Grafix # 7 applied 07/24/22---Also, go ahead and run IVR for more Grafix for left lateral foot Cellular or Tissue Based Product applied to wound bed, secured with steri-strips, cover with Adaptic or Mepitel. (DO NOT REMOVE). Edema Control - Lymphedema / SCD / Other: Avoid standing for long periods of time. Off-Loading: Open toe surgical shoe to: - wear when standing or walking. Hyperbaric Oxygen Therapy: Evaluate for HBO Therapy - 07/24/22- Pt. to finish next 8 HBO treatments 07/24/22- DR. Arrington Bencomo wants pt. to be extended for 40 more treatments Indication: - Wagner Grade III to left lateral foot 2.5 ATA for 90 Minutes with 2 Five (5) Minute Air Breaks T Number of Treatments: - 40; extend for 40 more otal One treatments per day (delivered Monday through Friday unless otherwise specified in Special Instructions below): Finger stick Blood Glucose Pre- and Post- HBOT Treatment. Follow Hyperbaric Oxygen Glycemia Protocol Afrin (Oxymetazoline HCL) 0.05% nasal spray - 1  spray in both nostrils daily as needed prior to HBO treatment for difficulty clearing ears WOUND #5: - Foot Wound Laterality: Left, Lateral Cleanser: Wound Cleanser (Generic) 1 x Per Day/30 Days Discharge Instructions: Cleanse the wound with wound cleanser prior to applying [name] clean dressing using gauze sponges, not tissue or cotton balls. Peri-Wound Care: Skin Prep (Generic) 1 x Per Day/30 Days Discharge Instructions: Use skin prep as directed Prim Dressing: Grafix (Generic) 1 x Per Day/30 Days ary Secondary Dressing: ABD Pad, 8x10 (Generic) 1 x Per Day/30 Days Discharge Instructions: Apply over primary dressing as directed. Secured With: [employer], 4.5x3.1 (in/yd) (Generic) 1 x Per Day/30 Days Discharge Instructions: Secure with Kerlix as directed. Secured With: 26M Medipore H Soft Cloth Surgical T ape, 4 x 10 (in/yd) (Generic) 1 x Per Day/30 Days Discharge Instructions: Secure with tape as directed. WOUND #6: - Amputation Site - Transmetatarsal Wound Laterality: Right Cleanser: Wound Cleanser (Generic) 1 x Per Day/30 Days Discharge Instructions: Cleanse the wound with wound cleanser prior to applying [name] clean dressing using gauze sponges, not tissue or cotton balls. Prim Dressing: Dakin's Solution 0.25%, 16 (oz) 1 x Per Day/30 Days ary Discharge Instructions: Moisten gauze with Dakin's solution Secondary Dressing: ABD Pad, 8x10 1 x Per Day/30 Days Discharge Instructions: Apply over primary dressing as directed. Secondary Dressing: Woven Gauze Sponge, Non-Sterile 4x4 in (Generic) 1 x Per Day/30 Days Discharge Instructions: Apply over primary dressing as directed. Secured With: [employer], 4.5x3.1 (in/yd) (Generic) 1 x Per Day/30 Days Discharge Instructions: Secure with Kerlix as directed. 1 In office sharp debridement 2. Grafix placed in standard fashion 3. Dakin's wet-to-dry 4. Aggressive offloadingoosurgical shoe, peg assist 5. Continue HBO therapy 6. Follow-up in  1 week Electronic Signature(s) Signed: 07/24/2022 11:32:06 AM By: [name] DO Entered By: [name] on 07/24/2022 11:28:16 -------------------------------------------------------------------------------- HxROS Details Patient Name: Date of Service: [name], [name] 07/24/2022 10:15 [name] Medical Record Number: [id number] Patient Account Number: [id number] Date of Birth/Sex: Treating RN: July 01, 1956 (67 y.o. Adelina Mings ([id number]) [id number] Page 12 of 13 Primary Care Provider: [name] Other Clinician: Referring Provider: Treating Provider/Extender: [name], [name] in Treatment:  14 Information Obtained From Patient Chart Eyes Medical History: Negative for: Cataracts; Glaucoma; Optic Neuritis Respiratory Medical History: Positive for: Asthma; Sleep Apnea Negative for: Aspiration; Chronic Obstructive Pulmonary Disease (COPD); Pneumothorax; Tuberculosis Cardiovascular Medical History: Positive for: Hypertension; Peripheral Venous Disease Negative for: Angina; Arrhythmia; Congestive Heart Failure; Coronary Artery Disease; Deep Vein Thrombosis; Hypotension; Myocardial Infarction; Peripheral Arterial Disease; Phlebitis; Vasculitis Endocrine Medical History: Positive for: Type II Diabetes Negative for: Type I Diabetes Time with diabetes: 20 years Treated with: Insulin, Oral agents Blood sugar tested every day: Yes Tested : 4x day Integumentary (Skin) Medical History: Negative for: History of Burn Musculoskeletal Medical History: Positive for: Osteomyelitis Negative for: Gout; Rheumatoid Arthritis; Osteoarthritis Neurologic Medical History: Positive for: Neuropathy Negative for: Dementia; Quadriplegia; Paraplegia; Seizure Disorder Psychiatric Medical History: Past Medical History Notes: PTSD Immunizations Pneumococcal Vaccine: Received Pneumococcal Vaccination: No Implantable  Devices None Hospitalization / Surgery History Type of Hospitalization/Surgery 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 Posey Pronto aortogram 12/08/2021 Dr. Virl Cagey [facility] Family and Social History Cancer: No; Diabetes: Yes - Mother,Siblings; Heart Disease: Yes - Siblings; Hereditary Spherocytosis: No; Hypertension: Yes - Siblings; Kidney Disease: No; Lung Disease: No; Seizures: No; Stroke: Yes - Mother; Thyroid Problems: No; Tuberculosis: No; Former smoker; Marital Status - Single; Alcohol Use: Never; Drug Use: No History; Caffeine Use: Daily; Financial Concerns: No; Food, Clothing or Shelter Needs: No; Support System Lacking: No; Transportation Concerns: No Electronic Signature(s) BROOKLYN, JEFF [name] ([id number]) [id number] Page 13 of 13 Signed: 07/24/2022 11:32:06 AM By: [name] DO Entered By: [name] on 07/24/2022 11:25:14 -------------------------------------------------------------------------------- SuperBill Details Patient Name: Date of Service: [name], [name] 07/24/2022 Medical Record Number: [id number] Patient Account Number: [id number] Date of Birth/Sex: Treating RN: 1956/02/21 (67 y.o. [name], [name] Primary Care Provider: [name] Other Clinician: Referring Provider: Treating Provider/Extender: [name] in Treatment: 14 Diagnosis Coding ICD-10 Codes Code Description [phone] Non-pressure chronic ulcer of other part of left foot with fat layer exposed M86.172 Other acute osteomyelitis, left ankle and foot L97.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 specified severity E11.621 Type 2 diabetes mellitus with foot ulcer E11.42 Type 2 diabetes mellitus with diabetic polyneuropathy S91.301A Unspecified open wound, right foot, initial encounter 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 severity Facility  Procedures : CPT4 Code: 21308657 Description: Q4133- Grafix PL 2X3 sq cm (6 units) Modifier: Quantity: 6 : CPT4 Code: 84696295 1 Description: 5275 - SKIN SUB GRAFT FACE/NK/HF/G ICD-10 Diagnosis Description L97.522 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.621 Type 2 diabetes mellitus with foot ulcer Modifier: Quantity: 1 : CPT4 Code: 28413244 1 Description: 0102 - DEB SUBQ TISSUE 20 SQ CM/< ICD-10 Diagnosis Description S91.301A Unspecified open wound, right foot, initial encounter E11.621 Type 2 diabetes mellitus with foot ulcer Modifier: Quantity: 1 Physician Procedures : CPT4 Code Description Modifier [id number] 40347 - WC PHYS SKIN SUB GRAFT FACE/NK/HF/G ICD-10 Diagnosis Description L97.522 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

## 2022-07-29 NOTE — Progress Notes (Signed)
[name], [name] ([id number]) [id number] Page 1 of 10 Visit Report for 07/24/2022 Arrival Information Details Patient Name: Date of Service: [name], [name]. 07/24/2022 10:15 [name] Medical Record Number: [id number] Patient Account Number: [id number] Date of Birth/Sex: Treating RN: Dec 30, 1955 (67 y.o. [name], [name] Primary Care Hartley Urton: [name] Other Clinician: Referring Montrez Marietta: Treating Madgie Dhaliwal/Extender: [name] in Treatment: 14 Visit Information History Since Last Visit Added or deleted any medications: No Patient Arrived: Wheel Chair Any new allergies or adverse reactions: No Arrival Time: 10:33 Had [name] fall or experienced change in No Accompanied By: self activities of daily living that may affect Transfer Assistance: Manual risk of falls: Patient Identification Verified: Yes Signs or symptoms of abuse/neglect since last visito No Secondary Verification Process Completed: Yes Hospitalized since last visit: No Patient Requires Transmission-Based Precautions: No Implantable device outside of the clinic excluding No Patient Has Alerts: Yes cellular tissue based products placed in the center Patient Alerts: ESBL;MRSA since last visit: Has Dressing in Place as Prescribed: Yes Pain Present Now: No Electronic Signature(s) Signed: 07/29/2022 4:09:44 PM By: Rhae Hammock RN Entered By: Rhae Hammock on 07/24/2022 10:34:12 -------------------------------------------------------------------------------- Encounter Discharge Information Details Patient Name: Date of Service: [name], [name] 07/24/2022 10:15 [name] Medical Record Number: [id number] Patient Account Number: [id number] Date of Birth/Sex: Treating RN: February 09, 1956 (67 y.o. [name], [name] Primary Care Otelia Hettinger: [name] Other Clinician: Referring Aubery Douthat: Treating Haly Feher/Extender: [name] in Treatment:  14 Encounter Discharge Information Items Post Procedure Vitals Discharge Condition: Stable Temperature (F): 98.7 Ambulatory Status: Wheelchair Pulse (bpm): 74 Discharge Destination: Home Respiratory Rate (breaths/min): 17 Transportation: Private Auto Blood Pressure (mmHg): 120/80 Accompanied By: self Schedule Follow-up Appointment: Yes Clinical Summary of Care: Patient Declined Electronic Signature(s) Signed: 07/29/2022 4:09:44 PM By: Rhae Hammock RN Entered By: Rhae Hammock on 07/24/2022 11:23:56 [name] ([id number]) [id number] Page 2 of 10 -------------------------------------------------------------------------------- Lower Extremity Assessment Details Patient Name: Date of Service: [name], [name]. 07/24/2022 10:15 [name] Medical Record Number: [id number] Patient Account Number: [id number] Date of Birth/Sex: Treating RN: 1955-10-20 (67 y.o. [name], [name] Primary Care Liberta Gimpel: [name] Other Clinician: Referring Keyera Hattabaugh: Treating Mollye Guinta/Extender: [name], [name] in Treatment: 14 Edema Assessment Assessed: [name]: Yes] [name]: Yes] Edema: [Left: Yes] [Right: No] Calf Left: Right: Point of Measurement: 36 cm From Medial Instep 39.1 cm 39 cm Ankle Left: Right: Point of Measurement: 9 cm From Medial Instep 22.7 cm 23 cm Vascular Assessment Pulses: Dorsalis Pedis Palpable: [Left:Yes] [Right:Yes] Posterior Tibial Palpable: [Left:Yes] [Right:Yes] Electronic Signature(s) Signed: 07/29/2022 4:09:44 PM By: Rhae Hammock RN Entered By: Rhae Hammock on 07/24/2022 10:38:35 -------------------------------------------------------------------------------- Multi Wound Chart Details Patient Name: Date of Service: [name], [name] 07/24/2022 10:15 [name] Medical Record Number: [id number] Patient Account Number: [id number] Date of Birth/Sex: Treating RN: 17-Jan-1956 (67 y.o. [name]) Primary Care  Jex Strausbaugh: [name] Other Clinician: Referring Manvir Prabhu: Treating Karyna Bessler/Extender: [name], [name] in Treatment: 14 Vital Signs Height(in): 72 Capillary Blood Glucose(mg/dl): 85 Weight(lbs): 220 Pulse(bpm): 78 Body Mass Index(BMI): 29.8 Blood Pressure(mmHg): 148/8 Temperature(F): 97.5 Respiratory Rate(breaths/min): 18 [5:Photos:] [[name]:[name] [phone].pdf Page 3 of 10] Left, Lateral Foot Right Amputation Site - [name] Wound Location: Transmetatarsal Surgical Injury Gradually Appeared [name] Wounding Event: Diabetic Wound/Ulcer of the Lower Diabetic Wound/Ulcer of the Lower [name] Primary Etiology: Extremity Extremity Open Surgical Wound [name] [name] Secondary Etiology: Asthma, Sleep Apnea, Hypertension, Asthma, Sleep Apnea, Hypertension, [name] Comorbid  History: Peripheral Venous Disease, Type II Peripheral Venous Disease, Type II Diabetes, Osteomyelitis, Neuropathy Diabetes, Osteomyelitis, Neuropathy 12/20/2021 07/16/2022 [name] Date Acquired: 14 1 [name] Weeks of Treatment: Open Open [name] Wound Status: No No [name] Wound Recurrence: 5x1x0.3 2.2x0.4x1 [name] Measurements L x W x D (cm) 3.927 0.691 [name] [name] (cm) : rea 1.178 0.691 [name] Volume (cm) : 60.70% 29.60% [name] % Reduction in [name] rea: 76.40% 29.60% [name] % Reduction in Volume: Grade 3 Grade 2 [name] Classification: Medium Medium [name] Exudate [name] mount: Serosanguineous Serosanguineous [name] Exudate Type: red, brown red, brown [name] Exudate Color: Epibole [name] [name] Wound Margin: Large (67-100%) Small (1-33%) [name] Granulation [name] mount: Red, Pink Red [name] Granulation Quality: Small (1-33%) Large (67-100%) [name] Necrotic [name] mount: Adherent Slough Eschar [name] Necrotic Tissue: Fat Layer (Subcutaneous Tissue): Yes Fat Layer (Subcutaneous Tissue): Yes [name] Exposed Structures: Fascia: No Fascia: No Tendon: No Tendon: No Muscle: No Muscle: No Joint: No Joint: No Bone: No Bone: No Large (67-100%) None  [name] Epithelialization: Debridement - Excisional Debridement - Excisional [name] Debridement: Pre-procedure Verification/Time Out 10:55 10:55 [name] Taken: Lidocaine Lidocaine [name] Pain Control: Callus, Subcutaneous, Slough Callus, Subcutaneous, Slough [name] Tissue Debrided: Skin/Subcutaneous Tissue Skin/Subcutaneous Tissue [name] Level: 5 0.88 [name] Debridement [name] (sq cm): rea Curette Curette [name] Instrument: Minimum Minimum [name] Bleeding: Pressure Pressure [name] Hemostasis [name] chieved: 0 0 [name] Procedural Pain: 0 0 [name] Post Procedural Pain: Procedure was tolerated well Procedure was tolerated well [name] Debridement Treatment Response: 5x1x0.3 2.2x0.4x1 [name] Post Debridement Measurements L x W x D (cm) 1.178 0.691 [name] Post Debridement Volume: (cm) Callus: Yes Excoriation: No [name] Periwound Skin Texture: Excoriation: No Induration: No Induration: No Callus: No Crepitus: No Crepitus: No Rash: No Rash: No Scarring: No Scarring: No Maceration: No Maceration: No [name] Periwound Skin Moisture: Dry/Scaly: No Dry/Scaly: No Atrophie Blanche: No Atrophie Blanche: No [name] Periwound Skin Color: Cyanosis: No Cyanosis: No Ecchymosis: No Ecchymosis: No Erythema: No Erythema: No Hemosiderin Staining: No Hemosiderin Staining: No Mottled: No Mottled: No Pallor: No Pallor: No Rubor: No Rubor: No No Abnormality [name] [name] Temperature: Cellular or Tissue Based Product Debridement [name] Procedures Performed: Debridement Treatment Notes Wound #5 (Foot) Wound Laterality: Left, Lateral Cleanser Wound Cleanser Discharge Instruction: Cleanse the wound with wound cleanser prior to applying [name] clean dressing using gauze sponges, not tissue or cotton balls. [name]-Wound Care Skin Prep Discharge Instruction: Use skin prep as directed RYDER, MAN [name] ([id number]) [id number] Page 4 of 10 Topical Primary Dressing Grafix Secondary Dressing ABD Pad, 8x10 Discharge  Instruction: Apply over primary dressing as directed. Secured With [employer], 4.5x3.1 (in/yd) Discharge Instruction: Secure with Kerlix as directed. 40M Medipore H Soft Cloth Surgical T ape, 4 x 10 (in/yd) Discharge Instruction: Secure with tape as directed. Compression Wrap Compression Stockings Add-Ons Wound #6 (Amputation Site - Transmetatarsal) Wound Laterality: Right Cleanser Wound Cleanser Discharge Instruction: Cleanse the wound with wound cleanser prior to applying [name] clean dressing using gauze sponges, not tissue or cotton balls. [name]-Wound Care Topical Primary Dressing Dakin's Solution 0.25%, 16 (oz) Discharge Instruction: Moisten gauze with Dakin's solution Secondary Dressing ABD Pad, 8x10 Discharge Instruction: Apply over primary dressing as directed. Woven Gauze Sponge, Non-Sterile 4x4 in Discharge Instruction: Apply over primary dressing as directed. Secured With [employer], 4.5x3.1 (in/yd) Discharge Instruction: Secure with Kerlix as directed. Compression Wrap Compression Stockings Add-Ons Electronic Signature(s) Signed: 07/24/2022 11:32:06 AM By: [name] DO Entered By: [name] on 07/24/2022 11:24:20 -------------------------------------------------------------------------------- Multi-Disciplinary Care Plan Details Patient Name: Date of Service:  [name], [name] 07/24/2022 10:15 [name] Medical Record Number: [id number] Patient Account Number: [id number] Date of Birth/Sex: Treating RN: May 15, 1956 (67 y.o. [name], [name] Primary Care Jevon Shells: [name] Other Clinician: Referring Loanne Emery: Treating Denny Lave/Extender: [name] in Treatment: [address] BERTHOLD, GLACE [name] ([id number]) [id number] Page 5 of 10 Osteomyelitis Nursing Diagnoses: Infection: osteomyelitis Goals: Diagnostic evaluation for osteomyelitis completed as ordered Date Initiated:  04/23/2022 Target Resolution Date: 08/07/2022 Goal Status: Active Signs and symptoms for osteomyelitis will be recognized and promptly addressed Date Initiated: 04/23/2022 Target Resolution Date: 08/07/2022 Goal Status: Active Interventions: Assess for signs and symptoms of osteomyelitis resolution every visit Provide education on osteomyelitis Screen for HBO Treatment Activities: Biopsy : 04/16/2022 Consult for HBO : 04/23/2022 Surgical debridement : 04/23/2022 Systemic antibiotics : 04/23/2022 T ordered outside of clinic : 04/23/2022 est Notes: Wound/Skin Impairment Nursing Diagnoses: Knowledge deficit related to ulceration/compromised skin integrity Goals: Patient/caregiver will verbalize understanding of skin care regimen Date Initiated: 04/14/2022 Target Resolution Date: 08/06/2022 Goal Status: Active Interventions: Assess patient/caregiver ability to perform ulcer/skin care regimen upon admission and as needed Assess ulceration(s) every visit Provide education on ulcer and skin care Notes: Electronic Signature(s) Signed: 07/29/2022 4:09:44 PM By: Rhae Hammock RN Entered By: Rhae Hammock on 07/24/2022 11:02:59 -------------------------------------------------------------------------------- Pain Assessment Details Patient Name: Date of Service: [name], [name] 07/24/2022 10:15 [name] Medical Record Number: [id number] Patient Account Number: [id number] Date of Birth/Sex: Treating RN: Feb 19, 1956 (67 y.o. [name], [name] Primary Care Tarina Volk: [name] Other Clinician: Referring Burwell Bethel: Treating [place]/Extender: [name] in Treatment: 14 Active Problems Location of Pain Severity and Description of Pain Patient Has Paino No Site Locations JARREN, PARA [name] ([id number]) [id number] Page 6 of 10 Pain Management and Medication Current Pain Management: Electronic Signature(s) Signed: 07/29/2022  4:09:44 PM By: Rhae Hammock RN Entered By: Rhae Hammock on 07/24/2022 10:34:34 -------------------------------------------------------------------------------- Patient/Caregiver Education Details Patient Name: Date of Service: [name], [name] 1/19/2024andnbsp10:15 [name] Medical Record Number: [id number] Patient Account Number: [id number] Date of Birth/Gender: Treating RN: July 11, 1955 (67 y.o. [name] Primary Care Physician: [name] Other Clinician: Referring Physician: Treating Physician/Extender: [name] in Treatment: 14 Education Assessment Education Provided To: Patient Education Topics Provided Wound/Skin Impairment: Methods: Explain/Verbal Responses: Reinforcements needed, State content correctly [employer]) Signed: 07/29/2022 4:09:44 PM By: Rhae Hammock RN Entered By: Rhae Hammock on 07/24/2022 11:11:42 -------------------------------------------------------------------------------- Wound Assessment Details Patient Name: Date of Service: [name], [name] 07/24/2022 10:15 [name] Medical Record Number: [id number] Patient Account Number: [id number] Date of Birth/Sex: Treating RN: 05-Jul-1956 (67 y.o. [name], [name] Primary Care Saw Mendenhall: [name] Other Clinician: Referring Ski Polich: Treating Justina Bertini/Extender: Zymire, Turnbo [name] ([id number]) [id number] Page 7 of 10 Weeks in Treatment: 14 Wound Status Wound Number: 5 Primary Diabetic Wound/Ulcer of the Lower Extremity Etiology: Wound Location: Left, Lateral Foot Secondary Open Surgical Wound Wounding Event: Surgical Injury Etiology: Date Acquired: 12/20/2021 Wound Open Weeks Of Treatment: 14 Status: Clustered Wound: No Comorbid Asthma, Sleep Apnea, Hypertension, Peripheral Venous Disease, History: Type II Diabetes, Osteomyelitis, Neuropathy Photos Wound  Measurements Length: (cm) 5 Width: (cm) 1 Depth: (cm) 0.3 Area: (cm) 3.927 Volume: (cm) 1.178 % Reduction in Area: 60.7% % Reduction in Volume: 76.4% Epithelialization: Large (67-100%) Tunneling: No Undermining: No Wound Description Classification: Grade 3 Wound Margin: Epibole Exudate Amount: Medium Exudate Type: Serosanguineous Exudate Color: red, brown Foul Odor After Cleansing: No  Slough/Fibrino Yes Wound Bed Granulation Amount: Large (67-100%) Exposed Structure Granulation Quality: Red, Pink Fascia Exposed: No Necrotic Amount: Small (1-33%) Fat Layer (Subcutaneous Tissue) Exposed: Yes Necrotic Quality: Adherent Slough Tendon Exposed: No Muscle Exposed: No Joint Exposed: No Bone Exposed: No Periwound Skin Texture Texture Color No Abnormalities Noted: No No Abnormalities Noted: Yes Callus: Yes Temperature / Pain Crepitus: No Temperature: No Abnormality Excoriation: No Induration: No Rash: No Scarring: No Moisture No Abnormalities Noted: No Dry / Scaly: No Maceration: No Treatment Notes Wound #5 (Foot) Wound Laterality: Left, Lateral Cleanser Wound Cleanser Discharge Instruction: Cleanse the wound with wound cleanser prior to applying [name] clean dressing using gauze sponges, not tissue or cotton balls. [name]-Wound Care Skin Prep Discharge Instruction: Use skin prep as directed DAREK, EIFLER [name] ([id number]) [id number] Page 8 of 10 Topical Primary Dressing Grafix Secondary Dressing ABD Pad, 8x10 Discharge Instruction: Apply over primary dressing as directed. Secured With [employer], 4.5x3.1 (in/yd) Discharge Instruction: Secure with Kerlix as directed. 63M Medipore H Soft Cloth Surgical T ape, 4 x 10 (in/yd) Discharge Instruction: Secure with tape as directed. Compression Wrap Compression Stockings Add-Ons Electronic Signature(s) Signed: 07/29/2022 4:09:44 PM By: Rhae Hammock RN Entered By: Rhae Hammock  on 07/24/2022 10:46:09 -------------------------------------------------------------------------------- Wound Assessment Details Patient Name: Date of Service: [name], [name] 07/24/2022 10:15 [name] Medical Record Number: [id number] Patient Account Number: [id number] Date of Birth/Sex: Treating RN: 1955-11-07 (67 y.o. [name], [name] Primary Care Yeshua Stryker: [name] Other Clinician: Referring Jennavieve Arrick: Treating Demoni Parmar/Extender: [name], [name] in Treatment: 14 Wound Status Wound Number: 6 Primary Diabetic Wound/Ulcer of the Lower Extremity Etiology: Wound Location: Right Amputation Site - Transmetatarsal Wound Open Wounding Event: Gradually Appeared Status: Date Acquired: 07/16/2022 Comorbid Asthma, Sleep Apnea, Hypertension, Peripheral Venous Disease, Weeks Of Treatment: 1 History: Type II Diabetes, Osteomyelitis, Neuropathy Clustered Wound: No Photos Wound Measurements Length: (cm) 2.2 Width: (cm) 0.4 Depth: (cm) 1 Area: (cm) 0.691 Volume: (cm) 0.691 % Reduction in Area: 29.6% % Reduction in Volume: 29.6% Epithelialization: None Wound Description Classification: Grade 2 Exudate Amount: Medium KION, HUNTSBERRY [name] ([id number]) Exudate Type: Serosanguineous Exudate Color: red, brown Foul Odor After Cleansing: No Slough/Fibrino Yes [phone] Page 9 of 10 Wound Bed Granulation Amount: Small (1-33%) Exposed Structure Granulation Quality: Red Fascia Exposed: No Necrotic Amount: Large (67-100%) Fat Layer (Subcutaneous Tissue) Exposed: Yes Necrotic Quality: Eschar Tendon Exposed: No Muscle Exposed: No Joint Exposed: No Bone Exposed: No Periwound Skin Texture Texture Color No Abnormalities Noted: No No Abnormalities Noted: No Callus: No Atrophie Blanche: No Crepitus: No Cyanosis: No Excoriation: No Ecchymosis: No Induration: No Erythema: No Rash: No Hemosiderin Staining: No Scarring: No Mottled:  No Pallor: No Moisture Rubor: No No Abnormalities Noted: No Dry / Scaly: No Maceration: No Treatment Notes Wound #6 (Amputation Site - Transmetatarsal) Wound Laterality: Right Cleanser Wound Cleanser Discharge Instruction: Cleanse the wound with wound cleanser prior to applying [name] clean dressing using gauze sponges, not tissue or cotton balls. [name]-Wound Care Topical Primary Dressing Dakin's Solution 0.25%, 16 (oz) Discharge Instruction: Moisten gauze with Dakin's solution Secondary Dressing ABD Pad, 8x10 Discharge Instruction: Apply over primary dressing as directed. Woven Gauze Sponge, Non-Sterile 4x4 in Discharge Instruction: Apply over primary dressing as directed. Secured With [employer], 4.5x3.1 (in/yd) Discharge Instruction: Secure with Kerlix as directed. Compression Wrap Compression Stockings Add-Ons Electronic Signature(s) Signed: 07/29/2022 4:09:44 PM By: Rhae Hammock RN Entered By: Rhae Hammock on 07/24/2022 10:45:49 -------------------------------------------------------------------------------- Vitals Details Patient Name: Date of Service: [name], [name]. 07/24/2022 10:15 [name] Medical Record Number: [id number] Patient Account Number: [id number] Date of Birth/Sex: Treating RN: Nov 09, 1955 (67 y.o. [name], Leta Baptist, Azucena Fallen ([id number]) [id number] Page 10 of 10 Primary Care Tahari Clabaugh: [name] Other Clinician: Referring Stephanye Finnicum: Treating Nicklos Gaxiola/Extender: [name], [name] in Treatment: 14 Vital Signs Time Taken: 10:34 Temperature (F): 97.5 Height (in): 72 Pulse (bpm): 78 Weight (lbs): 220 Respiratory Rate (breaths/min): 18 Body Mass Index (BMI): 29.8 Blood Pressure (mmHg): 148/8 Capillary Blood Glucose (mg/dl): 85 Reference Range: 80 - 120 mg / dl Electronic Signature(s) Signed: 07/29/2022 4:09:44 PM By: Rhae Hammock RN Entered By: Rhae Hammock on  07/24/2022 10:34:30

## 2022-07-29 NOTE — Progress Notes (Signed)
MCCALL, LOMAX A ([id number]) [id number] Page 1 of 2 Visit Report for 07/29/2022 Arrival Information Details Patient Name: Date of Service: [name], [name] 07/29/2022 8:00 A M Medical Record Number: [id number] Patient Account Number: [id number] Date of Birth/Sex: Treating RN: 10-29-1955 (67 y.o. [name] Primary Care Rejeana Fadness: [name] Other Clinician: Valeria Batman Referring Dorissa Stinnette: Treating Yona Stansbury/Extender: [name], [name] in Treatment: 15 Visit Information History Since Last Visit All ordered tests and consults were completed: Yes Patient Arrived: Wheel Chair Added or deleted any medications: No Arrival Time: 08:02 Any new allergies or adverse reactions: No Accompanied By: None Had a fall or experienced change in No Transfer Assistance: EasyPivot Patient Lift activities of daily living that may affect Patient Identification Verified: Yes risk of falls: Secondary Verification Process Completed: Yes Signs or symptoms of abuse/neglect since last visito No Patient Requires Transmission-Based Precautions: No Hospitalized since last visit: No Patient Has Alerts: Yes Implantable device outside of the clinic excluding No Patient Alerts: ESBL;MRSA cellular tissue based products placed in the center since last visit: Pain Present Now: No Electronic Signature(s) Signed: 07/29/2022 5:16:23 PM By: Valeria Batman EMT Entered By: Valeria Batman on 07/29/2022 17:16:22 -------------------------------------------------------------------------------- Encounter Discharge Information Details Patient Name: Date of Service: [name], [name] 07/29/2022 8:00 A M Medical Record Number: [id number] Patient Account Number: [id number] Date of Birth/Sex: Treating RN: [birth year] (67 y.o. [name] Primary Care Lonnell Chaput: [name] Other Clinician: Valeria Batman Referring Pattricia Weiher: Treating Mahmood Boehringer/Extender: [name], [name] in Treatment: 15 Encounter Discharge Information Items Discharge Condition: Stable Ambulatory Status: Wheelchair Discharge Destination: Home Transportation: Private Auto Accompanied By: None Schedule Follow-up Appointment: Yes Clinical Summary of Care: Electronic Signature(s) Signed: 07/29/2022 5:24:22 PM By: Valeria Batman EMT Entered By: Valeria Batman on 07/29/2022 17:24:22 [name] ([id number]) [id number] Page 2 of 2 -------------------------------------------------------------------------------- Vitals Details Patient Name: Date of Service: [name], [name] 07/29/2022 8:00 A M Medical Record Number: [id number] Patient Account Number: [id number] Date of Birth/Sex: Treating RN: December 15, 1955 (67 y.o. [name] Primary Care Tuff Clabo: [name] Other Clinician: Valeria Batman Referring Devante Capano: Treating Maxxon Schwanke/Extender: [name], [name] in Treatment: 15 Vital Signs Time Taken: 08:21 Capillary Blood Glucose (mg/dl): 273 Height (in): 72 Reference Range: 80 - 120 mg / dl Weight (lbs): 220 Body Mass Index (BMI): 29.8 Electronic Signature(s) Signed: 07/29/2022 5:18:59 PM By: Valeria Batman EMT Entered By: Valeria Batman on 07/29/2022 17:18:59

## 2022-07-30 ENCOUNTER — Encounter (HOSPITAL_BASED_OUTPATIENT_CLINIC_OR_DEPARTMENT_OTHER): Payer: Medicare HMO | Admitting: Internal Medicine

## 2022-07-30 DIAGNOSIS — E11621 Type 2 diabetes mellitus with foot ulcer: Secondary | ICD-10-CM | POA: Diagnosis not present

## 2022-07-30 DIAGNOSIS — L97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 specified severity: Secondary | ICD-10-CM | POA: Diagnosis not present

## 2022-07-30 DIAGNOSIS — M86172 Other acute osteomyelitis, left ankle and foot: Secondary | ICD-10-CM | POA: Diagnosis not present

## 2022-07-30 DIAGNOSIS — L97522 Non-pressure chronic ulcer of other part of left foot with fat layer exposed: Secondary | ICD-10-CM

## 2022-07-30 LAB — GLUCOSE, CAPILLARY
Glucose-Capillary: 169 mg/dL — ABNORMAL HIGH (ref 70–99)
Glucose-Capillary: 98 mg/dL (ref 70–99)

## 2022-07-30 NOTE — Progress Notes (Signed)
DANYL, DEEMS A ([id number]) [id number] Page 1 of 2 Visit Report for 07/30/2022 Arrival Information Details Patient Name: Date of Service: [name], [name] 07/30/2022 8:00 A M Medical Record Number: [id number] Patient Account Number: [id number] Date of Birth/Sex: Treating RN: 1955/07/09 (67 y.o. [name] Primary Care Lamoine Fredricksen: [name] Other Clinician: Valeria Batman Referring Nyasha Rahilly: Treating Annamay Laymon/Extender: [name] in Treatment: 15 Visit Information History Since Last Visit All ordered tests and consults were completed: Yes Patient Arrived: Wheel Chair Added or deleted any medications: No Arrival Time: 07:31 Any new allergies or adverse reactions: No Accompanied By: None Had a fall or experienced change in No Transfer Assistance: EasyPivot Patient Lift activities of daily living that may affect Patient Identification Verified: Yes risk of falls: Secondary Verification Process Completed: Yes Signs or symptoms of abuse/neglect since last visito No Patient Requires Transmission-Based Precautions: No Hospitalized since last visit: No Patient Has Alerts: Yes Implantable device outside of the clinic excluding No Patient Alerts: ESBL;MRSA cellular tissue based products placed in the center since last visit: Pain Present Now: No Electronic Signature(s) Signed: 07/30/2022 3:32:29 PM By: Valeria Batman EMT Entered By: Valeria Batman on 07/30/2022 15:32:29 -------------------------------------------------------------------------------- Encounter Discharge Information Details Patient Name: Date of Service: [name], [name] 07/30/2022 8:00 A M Medical Record Number: [id number] Patient Account Number: [id number] Date of Birth/Sex: Treating RN: 04/17/1956 (67 y.o. [name] Primary Care Jeannetta Cerutti: [name] Other Clinician: Valeria Batman Referring Hakeem Frazzini: Treating Nissi Doffing/Extender: [name] in Treatment: 15 Encounter Discharge Information Items Discharge Condition: Stable Ambulatory Status: Wheelchair Discharge Destination: Home Transportation: Private Auto Accompanied By: None Schedule Follow-up Appointment: Yes Clinical Summary of Care: Electronic Signature(s) Signed: 07/30/2022 3:59:51 PM By: Valeria Batman EMT Entered By: Valeria Batman on 07/30/2022 15:59:50 [name] ([id number]) [id number] Page 2 of 2 -------------------------------------------------------------------------------- Vitals Details Patient Name: Date of Service: [name], [name] 07/30/2022 8:00 A M Medical Record Number: [id number] Patient Account Number: [id number] Date of Birth/Sex: Treating RN: [birth year] (67 y.o. [name] Primary Care Gahel Safley: [name] Other Clinician: Valeria Batman Referring Macaria Bias: Treating Ritta Hammes/Extender: [name], [name] in Treatment: 15 Vital Signs Time Taken: 07:49 Temperature (F): 97.3 Height (in): 72 Pulse (bpm): 91 Weight (lbs): 220 Respiratory Rate (breaths/min): 16 Body Mass Index (BMI): 29.8 Blood Pressure (mmHg): 130/77 Capillary Blood Glucose (mg/dl): 169 Reference Range: 80 - 120 mg / dl Electronic Signature(s) Signed: 07/30/2022 3:40:23 PM By: Valeria Batman EMT Entered By: Valeria Batman on 07/30/2022 15:40:23

## 2022-07-30 NOTE — Progress Notes (Addendum)
FAMOUS, EISENHARDT A ([id number]) [id number] Page 1 of 2 Visit Report for 07/30/2022 HBO Details Patient Name: Date of Service: [name], [name] A. 07/30/2022 8:00 A M Medical Record Number: [id number] Patient Account Number: [id number] Date of Birth/Sex: Treating RN: [birth year] (67 y.o. [name] Primary Care Danessa Mensch: [name] Other Clinician: Valeria Batman Referring Ammon Muscatello: Treating Zyon Rosser/Extender: [name] in Treatment: 15 HBO Treatment Course Details Treatment Course Number: 1 Ordering Jorene Kaylor: [name] T Treatments Ordered: otal 80 HBO Treatment Start Date: 06/01/2022 HBO Indication: Diabetic Ulcer(s) of the Lower Extremity Standard/Conservative Wound Care tried and failed greater than or equal to 30 days HBO Treatment Details Treatment Number: 36 Patient Type: Outpatient Chamber Type: Monoplace Chamber Serial #: [id number] Treatment Protocol: 2.5 ATA with 90 minutes oxygen, with two 5 minute air breaks Treatment Details Compression Rate Down: 2.0 psi / minute De-Compression Rate Up: 2.0 psi / minute A breaks and breathing ir Compress Tx Pressure periods Decompress Decompress Begins Reached (leave unused spaces Begins Ends blank) Chamber Pressure (ATA 1 2.5 2.5 2.5 2.5 2.5 - - 2.5 1 ) Clock Time (24 hr) 08:08 08:22 08:52 08:57 09:27 09:32 - - 10:02 10:14 Treatment Length: 126 (minutes) Treatment Segments: 4 Vital Signs Capillary Blood Glucose Reference Range: 80 - 120 mg / dl HBO Diabetic Blood Glucose Intervention Range: <131 mg/dl or >249 mg/dl Type: Time Vitals Blood Pulse: Respiratory Temperature: Capillary Blood Glucose Pulse Action Taken: Pressure: Rate: Glucose (mg/dl): Meter #: Oximetry (%) Taken: Pre 07:49 130/77 91 16 97.3 169 Post 10:22 147/94 72 16 97.9 98 Patient given 8ox Glucerna and waited 65mns Treatment Response Treatment Toleration: Well Treatment Completion Status:  Treatment Completed without Adverse Event Treatment Notes The patient stated that he had an egg and cheese [place] with orange juice. Additional Procedure Documentation Tissue Sevierity: Necrosis of bone Physician HBO Attestation: I certify that I supervised this HBO treatment in accordance with Medicare guidelines. A trained emergency response team is readily available per Yes hospital policies and procedures. Continue HBOT as ordered. Yes Electronic Signature(s) Unsigned Previous Signature: 07/30/2022 3:58:35 PM Version By: GValeria BatmanEMT Previous Signature: 07/31/2022 11:11:08 AM Version By: HKalman ShanDO Previous Signature: 07/30/2022 3:51:24 PM Version By: GValeria BatmanEMT Entered By: HKalman Shanon 07/31/2022 11:12:03 Signature(s): TEMET, RAFANAN([id number]) [id number]): [id number] Page 2 of 2 -------------------------------------------------------------------------------- HBO Safety Checklist Details Patient Name: Date of Service: [name]. 07/30/2022 8:00 A M Medical Record Number: [id number] Account Number: [id number] of Birth/Sex: Treating RN: [birth year](67y.o. MWaldron SessionPrimary Care Cina Klumpp: BRoselee NovaOther Clinician: GValeria BatmanReferring Evian Salguero: Treating Kinjal Neitzke/Extender: HBurgess Amorin Treatment: 15 HBO Safety Checklist Items Safety Checklist Consent Form Signed Patient voided / foley secured and emptied When did you last eato 0700 Last dose of injectable or oral agent 0620 Ostomy pouch emptied and vented if applicable NA All implantable devices assessed, documented and approved Tympanostomy tubes Intravenous access site secured and place NA Valuables secured Linens and cotton and cotton/polyester blend (less than 51% polyester) Personal oil-based products / skin lotions / body lotions removed Wigs or hairpieces removed NA Smoking or tobacco materials  removed Books / newspapers / magazines / loose paper removed Cologne, aftershave, perfume and deodorant removed Jewelry removed (may wrap wedding band) Make-up removed NA Hair care products removed Battery operated devices (external) removed Heating patches and chemical warmers removed Titanium eyewear removed NA Nail polish cured greater than  10 hours NA Casting material cured greater than 10 hours NA Hearing aids removed NA Loose dentures or partials removed Removed by patient Prosthetics have been removed NA Patient demonstrates correct use of air break device (if applicable) Patient concerns have been addressed Patient grounding bracelet on and cord attached to chamber Specifics for Inpatients (complete in addition to above) Medication sheet sent with patient NA Intravenous medications needed or due during therapy sent with patient NA Drainage tubes (e.g. nasogastric tube or chest tube secured and vented) NA Endotracheal or Tracheotomy tube secured NA Cuff deflated of air and inflated with saline NA Airway suctioned Notes The safety checklist was done before the treatment was started. Electronic Signature(s) Signed: 07/30/2022 3:41:44 PM By: Valeria Batman EMT Entered By: Valeria Batman on 07/30/2022 15:41:44

## 2022-07-30 NOTE — Progress Notes (Signed)
[name], [name] ([id number]) [id number] Page 1 of 2 Visit Report for 07/29/2022 Problem List Details Patient Name: Date of Service: [name], [name] 07/29/2022 8:00 A M Medical Record Number: [id number] Patient Account Number: [id number] Date of Birth/Sex: Treating RN: March 14, 1956 (67 y.o. 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/14/2022 No Yes exposed M86.172 Other acute osteomyelitis, left ankle and foot 05/08/2022 No Yes L97.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 04/14/2022 No Yes specified severity E11.621 Type 2 diabetes mellitus with foot ulcer 04/14/2022 No Yes E11.42 Type 2 diabetes mellitus with diabetic polyneuropathy 04/14/2022 No Yes S91.301A Unspecified open wound, right foot, initial encounter 07/16/2022 No Yes 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 07/29/2022 Medical Record Number: [id number] Patient Account Number: [id number] Date of Birth/Sex: Treating RN: May 30, 1956 (67 y.o. 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 specified severity E11.621 Type 2 diabetes mellitus with foot ulcer E11.42 Type 2 diabetes mellitus with diabetic polyneuropathy S91.301A Unspecified open wound, right foot, initial encounter 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 severity Facility Procedures CPT4 Code Description Modifier Quantity [id number] G0277-(Facility Use Only) HBOT full body chamber, 70mn , 4 ICD-10 Diagnosis Description E11.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 M86.172 Other acute osteomyelitis, left ankle and foot Physician Procedures Quantity CPT4 Code Description Modifier [phone] - WC PHYS HYPERBARIC OXYGEN THERAPY 1 ICD-10 Diagnosis Description E11.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

## 2022-07-31 ENCOUNTER — Ambulatory Visit: Payer: Medicare HMO | Admitting: Vascular Surgery

## 2022-07-31 ENCOUNTER — Ambulatory Visit (HOSPITAL_COMMUNITY): Payer: Medicare HMO

## 2022-07-31 ENCOUNTER — Encounter (HOSPITAL_BASED_OUTPATIENT_CLINIC_OR_DEPARTMENT_OTHER): Payer: Medicare HMO | Admitting: Internal Medicine

## 2022-07-31 DIAGNOSIS — L97522 Non-pressure chronic ulcer of other part of left foot with fat layer exposed: Secondary | ICD-10-CM | POA: Diagnosis not present

## 2022-07-31 DIAGNOSIS — M86172 Other acute osteomyelitis, left ankle and foot: Secondary | ICD-10-CM | POA: Diagnosis not present

## 2022-07-31 DIAGNOSIS — S91301A Unspecified open wound, right foot, initial encounter: Secondary | ICD-10-CM | POA: Diagnosis not present

## 2022-07-31 DIAGNOSIS — E11621 Type 2 diabetes mellitus with foot ulcer: Secondary | ICD-10-CM

## 2022-07-31 DIAGNOSIS — L97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 specified severity: Secondary | ICD-10-CM

## 2022-07-31 LAB — GLUCOSE, CAPILLARY: Glucose-Capillary: 186 mg/dL — ABNORMAL HIGH (ref 70–99)

## 2022-07-31 NOTE — Progress Notes (Signed)
GEMAYEL, MASCIO A ([id number]) [id number] Page 1 of 2 Visit Report for 07/30/2022 Problem List Details Patient Name: Date of Service: [name], [name] 07/30/2022 8:00 A M Medical Record Number: [id number] Patient Account Number: [id number] Date of Birth/Sex: Treating RN: 1955-08-31 (67 y.o. 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/14/2022 No Yes exposed M86.172 Other acute osteomyelitis, left ankle and foot 05/08/2022 No Yes L97.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 04/14/2022 No Yes specified severity E11.621 Type 2 diabetes mellitus with foot ulcer 04/14/2022 No Yes E11.42 Type 2 diabetes mellitus with diabetic polyneuropathy 04/14/2022 No Yes S91.301A Unspecified open wound, right foot, initial encounter 07/16/2022 No Yes 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 07/16/2022 No Yes specified severity Inactive Problems Resolved Problems Electronic Signature(s) Signed: 07/30/2022 3:59:18 PM By: Valeria Batman EMT Signed: 07/31/2022 11:11:08 AM By: [name] DO Entered By: Valeria Batman on 07/30/2022 15:59:18 SuperBill Details -------------------------------------------------------------------------------- [name] ([id number]) [id number] Page 2 of 2 Patient Name: Date of Service: [name] 07/30/2022 Medical Record Number: [id number] Patient Account Number: [id number] Date of Birth/Sex: Treating RN: 1955-09-06 (67 y.o. 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 specified severity E11.621 Type 2 diabetes mellitus with foot ulcer E11.42 Type 2 diabetes mellitus with diabetic polyneuropathy S91.301A Unspecified open wound, right foot, initial encounter 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 severity Facility Procedures CPT4 Code Description Modifier Quantity [id number] G0277-(Facility Use Only) HBOT full body chamber, 46mn , 4 ICD-10 Diagnosis Description E11.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 M86.172 Other acute osteomyelitis, left ankle and foot Physician Procedures Quantity CPT4 Code Description Modifier [id number] - WC PHYS HYPERBARIC OXYGEN THERAPY 1 ICD-10 Diagnosis Description E11.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

## 2022-07-31 NOTE — Progress Notes (Signed)
ALDRICK, DERRIG A ([id number]) [id number] Page 1 of 2 Visit Report for 07/31/2022 HBO Details Patient Name: Date of Service: [name], [name] 07/31/2022 8:45 A M Medical Record Number: [id number] Patient Account Number: [id number] Date of Birth/Sex: Treating RN: 1955/12/20 (67 y.o. [name] Primary Care [name]: [name] Other Clinician: [name] Referring [name]: Treating [name]/Extender: [name] in Treatment: 15 HBO Treatment Course Details Treatment Course Number: 1 Ordering Kullen Tomasetti: [name] T Treatments Ordered: otal 80 HBO Treatment Start Date: 06/01/2022 HBO Indication: Diabetic Ulcer(s) of the Lower Extremity Standard/Conservative Wound Care tried and failed greater than or equal to 30 days HBO Treatment Details Treatment Number: 37 Patient Type: Outpatient Chamber Type: Monoplace Chamber Serial #: [id number] Treatment Protocol: 2.5 ATA with 90 minutes oxygen, with two 5 minute air breaks Treatment Details Compression Rate Down: 1.5 psi / minute De-Compression Rate Up: 2.0 psi / minute A breaks and breathing ir Compress Tx Pressure periods Decompress Decompress Begins Reached (leave unused spaces Begins Ends blank) Chamber Pressure (ATA 1 2.5 2.5 2.5 2.5 2.5 - - 2.5 1 ) Clock Time (24 hr) 09:04 09:20 09:49 09:54 10:24 10:29 - - 10:59 11:13 Treatment Length: 129 (minutes) Treatment Segments: 4 Vital Signs Capillary Blood Glucose Reference Range: 80 - 120 mg / dl HBO Diabetic Blood Glucose Intervention Range: <131 mg/dl or >249 mg/dl Type: Time Vitals Blood Respiratory Capillary Blood Glucose Pulse Action Pulse: Temperature: Taken: Pressure: Rate: Glucose (mg/dl): Meter #: Oximetry (%) Taken: Pre 08:28 150/86 99 18 98.5 222 1 none per protocol Post 11:15 177/94 94 18 98.3 186 1 none per protocol Treatment Response Treatment Toleration: Well Treatment Completion  Status: Treatment Completed without Adverse Event Treatment Notes Patient arrived, blood glucose measured at 222 mg/dL. Patient prepared for treatment, underwent safety check, and was safely placed in the chamber. Chamber was compressed at 1 psi/min until reaching 3 psig at which time patient verbalized that his ears were equalizing as normal. Rate set increased to 2 psi/min. Patient tolerated treatment and decompression of the chamber at 2 psi/min. Post-treatment blood glucose was 186 mg/dL. Patient had wound care encounter in clinic. When he returned, I offered him a snack (Orange juice, peanut butter, graham crackers-one serving each). Patient was stable upon discharge. Additional Procedure Documentation Tissue [name]: Necrosis of bone Physician HBO Attestation: I certify that I supervised this HBO treatment in accordance with Medicare guidelines. A trained emergency response team is readily available per Yes hospital policies and procedures. Continue HBOT as ordered. Yes Electronic Signature(s) Signed: 08/03/2022 11:52:55 AM By: [name] Shan DO Previous Signature: 07/31/2022 1:29:11 PM Version By: [name] CHT EMT BS , , Previous Signature: 08/03/2022 11:13:35 AM Version By: Elenor Legato ([id number]) [id number] Page 2 of 2 Previous Signature: 08/03/2022 11:13:35 AM Version By: [name] Shan DO Previous Signature: 07/31/2022 1:23:31 PM Version By: [name] CHT EMT BS , , Entered By: [name] on 08/03/2022 11:14:32 -------------------------------------------------------------------------------- HBO Safety Checklist Details Patient Name: Date of Service: [name], [name] 07/31/2022 8:45 A M Medical Record Number: [id number] Patient Account Number: [id number] Date of Birth/Sex: Treating RN: 1955/11/18 (67 y.o. [name] Primary Care Halana Deisher: [name] Other Clinician: [name] Referring Marit Goodwill: Treating Roben Schliep/Extender: [name] in Treatment: 15 HBO Safety Checklist Items Safety Checklist Consent Form Signed Patient voided / foley secured and emptied When did you last eato 0635 Last dose of injectable or oral agent 0600  Ostomy pouch emptied and vented if applicable NA All implantable devices assessed, documented and approved NA Intravenous access site secured and place NA Valuables secured Linens and cotton and cotton/polyester blend (less than 51% polyester) Personal oil-based products / skin lotions / body lotions removed Wigs or hairpieces removed NA Smoking or tobacco materials removed NA Books / newspapers / magazines / loose paper removed Cologne, aftershave, perfume and deodorant removed Jewelry removed (may wrap wedding band) Make-up removed NA Hair care products removed Battery operated devices (external) removed Heating patches and chemical warmers removed Titanium eyewear removed Nail polish cured greater than 10 hours NA Casting material cured greater than 10 hours NA Hearing aids removed NA Loose dentures or partials removed dentures removed Prosthetics have been removed NA Patient demonstrates correct use of air break device (if applicable) Patient concerns have been addressed Patient grounding bracelet on and cord attached to chamber Specifics for Inpatients (complete in addition to above) Medication sheet sent with patient NA Intravenous medications needed or due during therapy sent with patient NA Drainage tubes (e.g. nasogastric tube or chest tube secured and vented) NA Endotracheal or Tracheotomy tube secured NA Cuff deflated of air and inflated with saline NA Airway suctioned NA Notes Paper version used prior to treatment. Electronic Signature(s) Signed: 07/31/2022 12:57:43 PM By: [name] CHT EMT BS , , Entered By: [name] on 07/31/2022 12:57:43

## 2022-07-31 NOTE — Progress Notes (Addendum)
VANDELL, KUN [name] ([id number]) [id number] Page 1 of 2 Visit Report for 07/31/2022 Arrival Information Details Patient Name: Date of Service: [name], [name]. 07/31/2022 8:45 [name] Medical Record Number: [id number] Patient Account Number: [id number] Date of Birth/Sex: Treating RN: 1956/02/11 (67 y.o. [name] Primary Care Niquita [name]: [name] Other Clinician: [name] Referring [name]: Treating [name]/Extender: [name] in Treatment: 15 Visit Information History Since Last Visit All ordered tests and consults were completed: Yes Patient Arrived: Wheel Chair Added or deleted any medications: No Arrival Time: 07:42 Any new allergies or adverse reactions: No Accompanied By: self Had [name] fall or experienced change in No Transfer Assistance: None activities of daily living that may affect Patient Identification Verified: Yes risk of falls: Secondary Verification Process Completed: Yes Signs or symptoms of abuse/neglect since last visito No Patient Requires Transmission-Based Precautions: No Hospitalized since last visit: No Patient Has Alerts: Yes Implantable device outside of the clinic excluding No Patient Alerts: ESBL;MRSA cellular tissue based products placed in the center since last visit: Pain Present Now: No Electronic Signature(s) Signed: 07/31/2022 12:56:14 PM By: [name] CHT EMT BS , , Entered By: [name] on 07/31/2022 12:56:14 -------------------------------------------------------------------------------- Encounter Discharge Information Details Patient Name: Date of Service: [name], [name] 07/31/2022 8:45 [name] Medical Record Number: [id number] Patient Account Number: [id number] Date of Birth/Sex: Treating RN: [birth year] (67 y.o. [name] Primary Care [name]: [name] Other Clinician: [name] Referring [name]: Treating  [name]/Extender: [name] in Treatment: 15 Encounter Discharge Information Items Discharge Condition: Stable Ambulatory Status: Wheelchair Discharge Destination: Home Transportation: Private Auto Accompanied By: self Schedule Follow-up Appointment: No Clinical Summary of Care: Electronic Signature(s) Signed: 07/31/2022 1:24:27 PM By: [name] CHT EMT BS , , Entered By: [name] on 07/31/2022 13:24:26 [name] ([id number]) [id number] Page 2 of 2 -------------------------------------------------------------------------------- Vitals Details Patient Name: Date of Service: [name], [name]. 07/31/2022 8:45 [name] Medical Record Number: [id number] Patient Account Number: [id number] Date of Birth/Sex: Treating RN: 06-13-1956 (67 y.o. [name] Primary Care Jamiria Langill: [name] Other Clinician: [name] Referring Bernabe Dorce: Treating [name]/Extender: [name], [name] in Treatment: 15 Vital Signs Time Taken: 08:28 Temperature (F): 98.5 Height (in): 72 Pulse (bpm): 99 Weight (lbs): 220 Respiratory Rate (breaths/min): 18 Body Mass Index (BMI): 29.8 Blood Pressure (mmHg): 150/86 Capillary Blood Glucose (mg/dl): 222 Reference Range: 80 - 120 mg / dl Electronic Signature(s) Signed: 07/31/2022 78:58:85 PM By: [name] CHT EMT BS , , Entered By: [name] on 07/31/2022 02:77:41

## 2022-08-02 ENCOUNTER — Encounter (HOSPITAL_COMMUNITY): Payer: Self-pay

## 2022-08-02 ENCOUNTER — Ambulatory Visit (HOSPITAL_COMMUNITY)
Admission: RE | Admit: 2022-08-02 | Discharge: 2022-08-02 | Disposition: A | Discharge status: Home / Self Care | Payer: Medicare HMO | Source: Ambulatory Visit

## 2022-08-02 VITALS — BP 131/73 | HR 86 | Temp 97.9°F | Resp 16 | Wt 220.0 lb

## 2022-08-02 DIAGNOSIS — R051 Acute cough: Secondary | ICD-10-CM

## 2022-08-02 DIAGNOSIS — R0981 Nasal congestion: Secondary | ICD-10-CM

## 2022-08-02 DIAGNOSIS — J449 Chronic obstructive pulmonary disease, unspecified: Secondary | ICD-10-CM | POA: Diagnosis not present

## 2022-08-02 MED ORDER — BENZONATATE 100 MG PO CAPS: 100.0000 mg | ORAL_CAPSULE | Freq: Three times a day (TID) | ORAL | 0 refills | Status: AC

## 2022-08-02 MED ORDER — ALBUTEROL SULFATE HFA 108 (90 BASE) MCG/ACT IN AERS: 2.0000 | INHALATION_SPRAY | Freq: Four times a day (QID) | RESPIRATORY_TRACT | 0 refills | Status: AC | PRN

## 2022-08-02 MED ORDER — AZITHROMYCIN 250 MG PO TABS: 250.0000 mg | ORAL_TABLET | Freq: Every day | ORAL | 0 refills | Status: DC

## 2022-08-02 NOTE — ED Triage Notes (Signed)
Pt states that a week ago he developed a dry cough. Chest burning when he coughs. Runny nose started yesterday. Lost Smell for 1 day. No fever. He's taken NyQuil honey. [facility]es [facility] Mon-Friday.

## 2022-08-02 NOTE — Discharge Instructions (Signed)
Azithromycin has been sent to the pharmacy, you will take 2 tablets today and 1 tablet each day until finished with the prescription (day 5).  Tessalon has been sent to the pharmacy for cough, you can use this medication every 8 hours as needed.  A refill of your albuterol inhaler has been sent to the pharmacy, I recommend that you use this more routinely over the next 2 days.   If you develop any severe/concerning symptoms please go to the nearest emergency department for further evaluation.

## 2022-08-02 NOTE — ED Provider Notes (Signed)
[facility]    CSN: [id number] Arrival date & time: 08/02/22  1006      History   Chief Complaint No chief complaint on file.   HPI [name] is a 67 y.o. male.  Patient presents complaining of a nonproductive cough that started 1 week ago.  Patient reports that he has had some intermittent burning in his chest and rhinorrhea.  Patient reports that he had an episode of loss of sense of smell yesterday that has now resolved.  He denies any shortness of breath, fever, or chills.  He reports that his significant other has had a viral illness of unknown etiology.  He denies any known exposure to COVID.  He reports that he has taken NyQuil with some relief of symptoms at night.  He reports that he feels like his symptoms are improving, he reports that he just has a nagging cough and the nasal congestion.   He denies any history of pneumonia. He reports that he does use an albuterol inhaler and he states that he needs a refill.  He reports that he has a history of diabetes mellitus, he has ear tubes due to having to go to the hyperbaric chamber each week for ongoing wound treatment.   He reports that his blood sugars average around the 160s.   HPI  Past Medical History:  Diagnosis Date   Diabetes mellitus with peripheral vascular disease    Hypertension    Neuropathy    PTSD (post-traumatic stress disorder)    Sleep apnea     Patient Active Problem List   Diagnosis Date Noted   Acute renal insufficiency 05/13/2022   Medication monitoring encounter 05/13/2022   Acute osteomyelitis ([place]) 04/29/2022   Thrombocytosis 12/17/2021   Acute osteomyelitis of phalanx of foot, left ([place]) 12/16/2021   Acute osteomyelitis of metacarpal bone, left ([place]) 12/16/2021   Tenosynovitis of left foot 12/16/2021   S/P transmetatarsal amputation of foot, right ([place]) 12/16/2021   Diabetic foot ulcer with osteomyelitis ([place]) 12/16/2021   Normocytic anemia 12/16/2021   Gangrene of  left foot ([place]) 12/14/2021   PAD (peripheral artery disease) ([place]) 12/14/2021   Hyperlipidemia associated with type 2 diabetes mellitus ([place]) 12/14/2021   At risk for adverse drug event 09/23/2021   Cellulitis in diabetic foot ([place]) 09/13/2021   Dyslipidemia 09/13/2021   Depression 09/13/2021   Hypertension associated with diabetes ([place]) 09/13/2021   GERD without esophagitis 09/13/2021   Obstructive sleep apnea 09/13/2021   Gangrene of toe of right foot ([place]) 09/13/2021   Type 2 diabetes mellitus ([place]) 08/15/2021    Past Surgical History:  Procedure Laterality Date   ABDOMINAL AORTOGRAM W/LOWER EXTREMITY Right 09/10/2021   Procedure: ABDOMINAL AORTOGRAM W/LOWER EXTREMITY;  Surgeon: [name], MD;  Location: [facility];  Service: Cardiovascular;  Laterality: Right;   ABDOMINAL AORTOGRAM W/LOWER EXTREMITY N/A 12/17/2021   Procedure: ABDOMINAL AORTOGRAM W/LOWER EXTREMITY;  Surgeon: [name], MD;  Location: [facility];  Service: Cardiovascular;  Laterality: N/A;   ABDOMINAL AORTOGRAM W/LOWER EXTREMITY N/A 12/03/2021   Procedure: ABDOMINAL AORTOGRAM W/LOWER EXTREMITY;  Surgeon: [name], MD;  Location: [facility];  Service: Cardiovascular;  Laterality: N/A;   ABDOMINAL HERNIA REPAIR     AMPUTATION Right 09/19/2021   Procedure: TRANSMETATARSAL AMPUTATION FOOT;  Surgeon: Lorenda Peck, MD;  Location: [facility];  Service: Podiatry;  Laterality: Right;  Surgical team to do local block   AMPUTATION Left 12/20/2021   Procedure:  LEFT FOOT REVISION PARTIAL FIRST RAY AMPUTATION WITH PLACEMENT OF PRESSURE WOUND VAC;  Surgeon: [name], DPM;  Location: [facility];  Service: Podiatry;  Laterality: Left;   AMPUTATION TOE Right 09/15/2021   Procedure: AMPUTATION 4th TOE;  Surgeon: [name], MD;  Location: [facility];  Service: Vascular;  Laterality: Right;   AMPUTATION TOE Left 12/18/2021   Procedure: AMPUTATION LEFT BIG TOE; LEFT 5TH RAY PARTIAL AMPUTATION;   Surgeon: [name], DPM;  Location: [facility];  Service: Podiatry;  Laterality: Left;   BACK SURGERY     CORONARY ATHERECTOMY N/A 12/08/2021   Procedure: CORONARY ATHERECTOMY;  Surgeon: Waynetta Sandy, MD;  Location: [facility];  Service: Cardiovascular;  Laterality: N/A;  Laser - Lt. PT   FEMORAL-TIBIAL BYPASS GRAFT Right 09/15/2021   Procedure: BYPASS GRAFT BELOW KNEE POPLITEAL ARTERY TO POSTERIOR TIBIAL ARTERY;  Surgeon: [name], MD;  Location: [facility];  Service: Vascular;  Laterality: Right;   IRRIGATION AND DEBRIDEMENT FOOT Right 09/17/2021   Procedure: IRRIGATION AND DEBRIDEMENT FOOT;  Surgeon: Lorenda Peck, MD;  Location: [facility];  Service: Podiatry;  Laterality: Right;  Suregeon will do anesthesia block   KNEE SURGERY     LOWER EXTREMITY ANGIOGRAPHY Left 12/08/2021   Procedure: Lower Extremity Angiography;  Surgeon: Waynetta Sandy, MD;  Location: [facility];  Service: Cardiovascular;  Laterality: Left;   PERIPHERAL VASCULAR BALLOON ANGIOPLASTY  09/10/2021   Procedure: PERIPHERAL VASCULAR BALLOON ANGIOPLASTY;  Surgeon: [name], MD;  Location: [facility];  Service: Cardiovascular;;  rt sfa pta   PERIPHERAL VASCULAR BALLOON ANGIOPLASTY Right 12/03/2021   Procedure: PERIPHERAL VASCULAR BALLOON ANGIOPLASTY;  Surgeon: [name], MD;  Location: [facility];  Service: Cardiovascular;  Laterality: Right;   PERIPHERAL VASCULAR INTERVENTION Left 12/08/2021   Procedure: PERIPHERAL VASCULAR INTERVENTION;  Surgeon: Waynetta Sandy, MD;  Location: [facility];  Service: Cardiovascular;  Laterality: Left;  LT. SFA   SHOULDER SURGERY     VEIN HARVEST Right 09/15/2021   Procedure: GREATER SAPHENOUS VEIN HARVEST;  Surgeon: [name], MD;  Location[facility];  Service: Vascular;  Laterality: Right;       Home Medications    Prior to Admission medications   Medication Sig Start Date End Date Taking? Authorizing  Provider  amLODipine (NORVASC) 5 MG tablet Take 1 tablet (5 mg total) by mouth every morning. 10/22/21  Yes Medina-Vargas, Monina C, NP  aspirin EC 81 MG tablet Take 1 tablet (81 mg total) by mouth daily. Swallow whole. 09/10/21 09/10/22 Yes [name], MD  atorvastatin (LIPITOR) 80 MG tablet Take 1 tablet (80 mg total) by mouth at bedtime. 10/22/21  Yes Medina-Vargas, Monina C, NP  azithromycin (ZITHROMAX) 250 MG tablet Take 1 tablet (250 mg total) by mouth daily. Take first 2 tablets together, then 1 every day until finished. 08/02/22  Yes Flossie Dibble, NP  benzonatate (TESSALON) 100 MG capsule Take 1 capsule (100 mg total) by mouth every 8 (eight) hours. 08/02/22  Yes Flossie Dibble, NP  buPROPion (WELLBUTRIN XL) 300 MG 24 hr tablet Take 1 tablet (300 mg total) by mouth every morning. 10/22/21  Yes Medina-Vargas, Monina C, NP  cholecalciferol (VITAMIN D) 25 MCG (1000 UNIT) tablet Take 1,000 Units by mouth daily.   Yes [provider]  clopidogrel (PLAVIX) 75 MG tablet Take 1 tablet (75 mg total) by mouth daily. 12/03/21 12/03/22 Yes [name],  MD  empagliflozin (JARDIANCE) 25 MG TABS tablet Take 1 tablet (25 mg total) by mouth daily. 10/22/21  Yes Medina-Vargas, Monina C, NP  fluticasone furoate-vilanterol (BREO ELLIPTA) 100-25 MCG/INH AEPB Inhale 1 puff into the lungs daily. 02/11/21  Yes Chesley Mires, MD  gabapentin (NEURONTIN) 600 MG tablet Take 600 mg by mouth 2 (two) times daily. 02/21/21  Yes [provider]  HYDROcodone-acetaminophen (NORCO/VICODIN) 5-325 MG tablet Take 1 tablet by mouth every 6 (six) hours as needed. 05/05/22  Yes [provider]  insulin aspart (NOVOLOG) 100 UNIT/ML injection CBG 70 - 120: 0 units CBG 121 - 150: 1 unit CBG 151 - 200: 2 units CBG 201 - 250: 3 units CBG 251 - 300: 5 units CBG 301 - 350: 7 units CBG 351 - 400: 9 units 12/24/21  Yes Hosie Poisson, MD  insulin glargine-yfgn (SEMGLEE) 100 UNIT/ML injection Inject 0.2 mLs (20  Units total) into the skin 2 (two) times daily. 12/24/21  Yes Hosie Poisson, MD  lisinopril (ZESTRIL) 20 MG tablet Take 20 mg by mouth daily.   Yes [provider]  lurasidone (LATUDA) 40 MG TABS tablet Take 1 tablet (40 mg total) by mouth daily with supper. 10/22/21  Yes Medina-Vargas, Monina C, NP  meclizine (ANTIVERT) 25 MG tablet Take 1 tablet (25 mg total) by mouth 3 (three) times daily as needed for dizziness. 10/22/21  Yes Medina-Vargas, Monina C, NP  metoprolol tartrate (LOPRESSOR) 50 MG tablet Take 1 tablet (50 mg total) by mouth 2 (two) times daily. 10/22/21  Yes Medina-Vargas, Monina C, NP  OLANZapine (ZYPREXA) 2.5 MG tablet Take 1 tablet (2.5 mg total) by mouth at bedtime. 10/22/21  Yes Medina-Vargas, Monina C, NP  omeprazole (PRILOSEC) 20 MG capsule Take 1 capsule (20 mg total) by mouth at bedtime. 10/22/21  Yes Medina-Vargas, Monina C, NP  OXcarbazepine (TRILEPTAL) 150 MG tablet Take 1 tablet (150 mg total) by mouth 2 (two) times daily. 10/22/21  Yes Medina-Vargas, Monina C, NP  Oxycodone HCl 10 MG TABS Take 10 mg by mouth every 6 (six) hours as needed. 07/10/22  Yes [provider]  albuterol (VENTOLIN HFA) 108 (90 Base) MCG/ACT inhaler Inhale 2 puffs into the lungs every 6 (six) hours as needed for wheezing or shortness of breath. 08/02/22   Flossie Dibble, NP  senna-docusate (SENOKOT-S) 8.6-50 MG tablet Take 1 tablet by mouth at bedtime as needed for mild constipation. 12/24/21   Hosie Poisson, MD    Family History Family History  Problem Relation Age of Onset   Stroke Mother    Coronary artery disease Father    COPD Brother     Social History Social History   Tobacco Use   Smoking status: Former    Types: Cigarettes    Passive exposure: Never   Smokeless tobacco: Never  Vaping Use   Vaping Use: Never used  Substance Use Topics   Alcohol use: No   Drug use: Never     Allergies   Gemfibrozil, Oxycontin [oxycodone hcl], Pork-derived products, and  Simvastatin   Review of Systems Review of Systems  Constitutional:  Negative for activity change, appetite change, chills, fatigue and fever.  HENT:  Positive for congestion and rhinorrhea. Negative for dental problem, ear discharge, ear pain, postnasal drip, sinus pressure, sinus pain, sore throat and trouble swallowing.   Eyes: Negative.   Respiratory:  Positive for cough. Negative for chest tightness, shortness of breath, wheezing and stridor.   Cardiovascular:  Negative for chest pain and  palpitations.  Gastrointestinal: Negative.   Musculoskeletal:  Negative for myalgias.     Physical Exam Triage Vital Signs ED Triage Vitals  Enc Vitals Group     BP 08/02/22 1049 131/73     Pulse Rate 08/02/22 1049 86     Resp 08/02/22 1049 16     Temp 08/02/22 1049 97.9 F (36.6 C)     Temp Source 08/02/22 1049 Oral     SpO2 08/02/22 1049 95 %     Weight 08/02/22 1041 220 lb (99.8 kg)     Height --      Head Circumference --      Peak Flow --      Pain Score 08/02/22 1041 0     Pain Loc --      Pain Edu? --      Excl. in [place]? --    No data found.  Updated Vital Signs BP 131/73 (BP Location: Left Arm)   Pulse 86   Temp 97.9 F (36.6 C) (Oral)   Resp 16   Wt 220 lb (99.8 kg)   SpO2 95%   BMI 29.84 kg/m   Physical Exam Vitals and nursing note reviewed.  Constitutional:      Appearance: Normal appearance.  HENT:     Right Ear: Hearing, tympanic membrane, ear canal and external ear normal.     Left Ear: Hearing, tympanic membrane, ear canal and external ear normal.     Ears:     Comments: Ear tubes are present bilaterally.     Nose: Congestion and rhinorrhea present. Rhinorrhea is clear.     Right Turbinates: Not enlarged, swollen or pale.     Left Turbinates: Enlarged and swollen. Not pale.     Right Sinus: No maxillary sinus tenderness or frontal sinus tenderness.     Left Sinus: No maxillary sinus tenderness or frontal sinus tenderness.  Cardiovascular:     Rate and  Rhythm: Normal rate and regular rhythm.     Heart sounds: Normal heart sounds, S1 normal and S2 normal.  Pulmonary:     Effort: Pulmonary effort is normal.     Breath sounds: Examination of the right-lower field reveals decreased breath sounds. Examination of the left-lower field reveals decreased breath sounds. Decreased breath sounds present. No wheezing, rhonchi or rales.  Lymphadenopathy:     Cervical: No cervical adenopathy.  Neurological:     Mental Status: He is alert.      UC Treatments / Results  Labs (all labs ordered are listed, but only abnormal results are displayed) Labs Reviewed - No data to display  EKG   Radiology No results found.  Procedures Procedures (including critical care time)  Medications Ordered in UC Medications - No data to display  Initial Impression / Assessment and Plan / UC Course  I have reviewed the triage vital signs and the nursing notes.  Pertinent labs & imaging results that were available during my care of the patient were reviewed by me and considered in my medical decision making (see chart for details).     Patient was evaluated for acute cough and nasal congestion. High suspicion for an initial viral illness which may be causing an inflammatory response.  Due to patient's medical history of diabetes mellitus, this writer made decision not to start any steroid.  Prescription for azithromycin was decided known due to patient's medical history, wanting to prevent any possible secondary bacterial infection, and for the anti-inflammatory effects of this medicine.  Tessalon  and albuterol inhaler was sent to the pharmacy, patient was made aware of treatment regiment.  Patient was made aware of red flag symptoms that would warrant an emergency department visit. Patient was made aware of timeline for symptom resolution.  Patient verbalized understanding of instructions.   Charting was provided using a a verbal dictation system, charting was  proofread for errors, errors may occur which could change the meaning of the information charted.   Final Clinical Impressions(s) / UC Diagnoses   Final diagnoses:  Acute cough  Nasal congestion     Discharge Instructions      Azithromycin has been sent to the pharmacy, you will take 2 tablets today and 1 tablet each day until finished with the prescription (day 5).  Tessalon has been sent to the pharmacy for cough, you can use this medication every 8 hours as needed.  A refill of your albuterol inhaler has been sent to the pharmacy, I recommend that you use this more routinely over the next 2 days.   If you develop any severe/concerning symptoms please go to the nearest emergency department for further evaluation.     ED Prescriptions     Medication Sig Dispense Auth. Provider   benzonatate (TESSALON) 100 MG capsule Take 1 capsule (100 mg total) by mouth every 8 (eight) hours. 28 capsule Flossie Dibble, NP   azithromycin (ZITHROMAX) 250 MG tablet Take 1 tablet (250 mg total) by mouth daily. Take first 2 tablets together, then 1 every day until finished. 6 tablet Flossie Dibble, NP   albuterol (VENTOLIN HFA) 108 (90 Base) MCG/ACT inhaler Inhale 2 puffs into the lungs every 6 (six) hours as needed for wheezing or shortness of breath. 8 g Flossie Dibble, NP      PDMP not reviewed this encounter.   Flossie Dibble, NP 08/02/22 1345

## 2022-08-03 ENCOUNTER — Encounter (HOSPITAL_BASED_OUTPATIENT_CLINIC_OR_DEPARTMENT_OTHER): Payer: Medicare HMO | Admitting: Internal Medicine

## 2022-08-03 DIAGNOSIS — E11621 Type 2 diabetes mellitus with foot ulcer: Secondary | ICD-10-CM | POA: Diagnosis not present

## 2022-08-03 LAB — GLUCOSE, CAPILLARY
Glucose-Capillary: 222 mg/dL — ABNORMAL HIGH (ref 70–99)
Glucose-Capillary: 182 mg/dL — ABNORMAL HIGH (ref 70–99)
Glucose-Capillary: 184 mg/dL — ABNORMAL HIGH (ref 70–99)

## 2022-08-03 NOTE — Progress Notes (Signed)
[name], [name] ([id number]) [name].pdf Page 1 of 14 Visit Report for 07/31/2022 Chief Complaint Document Details Patient Name: Date of Service: [name], [name]. 07/31/2022 11:00 [name] Medical Record Number: [id number] Patient Account Number: [id number] Date of Birth/Sex: Treating RN: Nov 23, 1955 (67 y.o. M) Primary Care Provider: Roselee [name] Other Clinician: Referring Provider: Treating Provider/Extender: [name], [name] in Treatment: 15 Information Obtained from: Patient Chief Complaint 10/16/20; patient is here with [name] substantial wound [name] his right foot in the setting of [name] recent transmetatarsal amputation. 04/14/2022; referral from podiatry for postsurgical wound of nonhealing partial left fifth ray amputation, right foot (07/16/22): TMA dehiscence Electronic Signature(s) Signed: 08/03/2022 11:13:35 AM By: [name] Shan DO Entered By: [name] [name] [name] 12:09:59 -------------------------------------------------------------------------------- Cellular or Tissue Based Product Details Patient Name: Date of Service: [name], [name]. 07/31/2022 11:00 [name] Medical Record Number: [id number] Patient Account Number: [id number] Date of Birth/Sex: Treating RN: 1956-06-25 (67 y.o. [name] Primary Care Provider: Roselee [name] Other Clinician: Referring Provider: Treating Provider/Extender: [name] in Treatment: 15 Cellular or Tissue Based Product Type Wound #5 Left,Lateral Foot Applied to: Performed By: Physician [name] Shan, DO Cellular or Tissue Based Product Type: Grafix prime Level of Consciousness (Pre-procedure): Awake and Alert Pre-procedure Verification/Time Out Yes - 11:55 Taken: Location: genitalia / hands / feet / multiple digits Wound Size (sq cm): 3.68 Product Size (sq cm): 6 Waste Size (sq cm): 0 Amount of Product Applied (sq cm): 6 Instrument Used: Forceps,  Scissors Lot #: [id number] Order #: 7 Expiration Date: 12/23/2023 Fenestrated: No Reconstituted: Yes Solution Type: Normal saline Solution Amount: 50m Lot #: [id number] Expiration Date: 12/03/2024 Secured: Yes Secured With: Steri-Strips, adaptic Dressing Applied: No Procedural Pain: 0 Post Procedural Pain: 0 Response to Treatment: Procedure was tolerated well Level of Consciousness (Post- Awake and Alert [name].pdf Page 2 of 14 procedure): Post Procedure Diagnosis Same as Pre-procedure Electronic Signature(s) Signed: 07/31/2022 4:56:20 PM By: [name] PillingRN, [name] Signed: 08/03/2022 11:13:35 AM By: HKalman ShanDO Entered By: [name] [name] 11:59:50 -------------------------------------------------------------------------------- Debridement Details Patient Name: Date of Service: [name] [name]. 07/31/2022 11:00 [name] Medical Record Number: [id number] Account Number: [id number] of Birth/Sex: Treating RN: [birth year](67y.o. MLorette [name] BTammi KlippelPrimary Care Provider: BRoselee NovaOther Clinician: Referring Provider: Treating Provider/Extender: HBurgess Amorin Treatment: 15 Debridement Performed for Assessment: Wound #5 Left,Lateral Foot Performed By: Physician HKalman Shan DO Debridement Type: Debridement Severity of Tissue Pre Debridement: Fat layer exposed Level of Consciousness (Pre-procedure): Awake and Alert Pre-procedure Verification/Time Out Yes - 11:45 Taken: Start Time: 11:46 Pain Control: Lidocaine 4% T opical Solution T Area Debrided (L x W): otal 4.6 (cm) x 1 (cm) = 4.6 (cm) Tissue and other material debrided: Viable, Non-Viable, Callus, Slough, Subcutaneous, Skin: Dermis , Skin: Epidermis, Slough Level: Skin/Subcutaneous Tissue Debridement Description: Excisional Instrument: Curette Bleeding: Minimum Hemostasis Achieved:  Pressure End Time: 11:50 Procedural Pain: 0 Post Procedural Pain: 0 Response to Treatment: Procedure was tolerated well Level of Consciousness (Post- Awake and Alert procedure): Post Debridement Measurements of Total Wound Length: (cm) 4.6 Width: (cm) 0.8 Depth: (cm) 0.3 Volume: (cm) 0.867 Character of Wound/Ulcer Post Debridement: Improved Severity of Tissue Post Debridement: Fat layer exposed Post Procedure Diagnosis Same as Pre-procedure Electronic Signature(s) Signed: 07/31/2022 4:56:20 PM By: [name] PillingRN, [name] Signed: 08/03/2022 11:13:35 AM By: HKalman ShanDO Entered By: DRolin [name]  [name] 11:57:13 [name], [name] ([id number]) [phone].pdf Page 3 of 14 -------------------------------------------------------------------------------- Debridement Details Patient Name: Date of Service: [name], [name]. 07/31/2022 11:00 [name] Medical Record Number: [id number] Patient Account Number: [id number] Date of Birth/Sex: Treating RN: Mar 29, 1956 (67 y.o. [name], [name] Primary Care Provider: Roselee [name] Other Clinician: Referring Provider: Treating Provider/Extender: [name] in Treatment: 15 Debridement Performed for Assessment: Wound #6 Right Amputation Site - Transmetatarsal Performed By: Physician [name] Shan, DO Debridement Type: Debridement Severity of Tissue Pre Debridement: Fat layer exposed Level of Consciousness (Pre-procedure): Awake and Alert Pre-procedure Verification/Time Out Yes - 11:45 Taken: Start Time: 11:46 Pain Control: Lidocaine 4% T opical Solution T Area Debrided (L x W): otal 1 (cm) x 1 (cm) = 1 (cm) Tissue and other material debrided: Viable, Non-Viable, Callus, Slough, Subcutaneous, Skin: Dermis , Skin: Epidermis, Slough Level: Skin/Subcutaneous Tissue Debridement Description: Excisional Instrument: Curette Bleeding: Minimum Hemostasis Achieved: Pressure End Time:  11:50 Procedural Pain: 0 Post Procedural Pain: 0 Response to Treatment: Procedure was tolerated well Level of Consciousness (Post- Awake and Alert procedure): Post Debridement Measurements of Total Wound Length: (cm) 0.2 Width: (cm) 0.5 Depth: (cm) 0.9 Volume: (cm) 0.071 Character of Wound/Ulcer Post Debridement: Improved Severity of Tissue Post Debridement: Fat layer exposed Post Procedure Diagnosis Same as Pre-procedure Electronic Signature(s) Signed: 07/31/2022 4:56:20 PM By: [name] RN, [name] Signed: 08/03/2022 11:13:35 AM By: [name] Shan DO Entered By: Deon [name] [name] [name] 11:57:41 -------------------------------------------------------------------------------- HPI Details Patient Name: Date of Service: [name], [name]. 07/31/2022 11:00 [name] Medical Record Number: [id number] Patient Account Number: [id number] Date of Birth/Sex: Treating RN: [birth year] (67 y.o. M) Primary Care Provider: Roselee [name] Other Clinician: Referring Provider: Treating Provider/Extender: [name], [name] in Treatment: 15 History of Present Illness HPI Description: ADMISSION 10/16/20 This is [name] 67 year old man who is [name] type II diabetic. Initially seen by Dr. Unk Lightning of vein and vascular [name] 09/10/2021 for bilateral lower extremity rest pain right greater than left. His ABIs demonstrated monophasic waveforms at the ankles bilaterally with depressed toe pressures. His ABI in the right was 0.5 and [name] the left 0.28. There were no obtainable waveforms for TBI's. He underwent angiography [name] 09/10/2021 and had findings of severe PAD with 95% stenosis of the distal SFA. He also had peroneal and posterior tibial arteries occluded with significant collateralization in the calf there was reconstitution of the posterior tibial artery in the distal third of the calf. Ostia of the anterior tibial artery was not appreciated. He underwent [name] angioplasty of the superficial femoral artery.  He required admission to hospital from 09/12/2021 through 09/22/2021 with right foot cellulitis and sepsis. [name] 3/13 he underwent [name] right below-knee popliteal to [name], [name] ([id number]) [phone].pdf Page 4 of 14 posterior tibial bypass using [name] greater saphenous vein. Ultimately however he required [name] right TMA by podiatry which I believe was done [name] 3/17 for progressive diabetic foot infection. He was discharged to [facility] skilled facility. He arrives in clinic today with [name] large wound area over the entirety of his amputation site extending proximally. The attempted flap closure of the amputation site and his TMA has failed and the wound extends under the attempt to closure. Still some sutures in place. There is an odor but he is not systemically unwell. He is due for follow-up arterial studies and has an appointment with vascular surgery [name] 10/28/2021. They are using wet-to-dry dressings at [facility]. Past medical history includes  type 2 diabetes with peripheral neuropathy, hypertension, obstructive sleep apnea 4/20; patient presents for follow-up. He is being discharged from his facility at [facility] today. They have been doing Dakin's wet-to-dry dressings. He states that his fiance can help with dressing changes. He is getting [facility] home [facility] to come out 3 times [name] week once he leaves the facility. He is scheduled to see vein and vascular [name] 5/12. He has not seen the wound himself. He puts covers over his face for wound exams. He currently denies systemic signs of infection. 4/28; patient admitted to the clinic 2 weeks ago with [name] dehisced right TMA in the setting of type 2 diabetes with significant PAD. He is status post revascularization. He has been discharged from the nursing home he was at and now is at home using Dakin's wet-to-dry dressings daily he has home health. He denies any systemic signs of infection 5/4; patient presents for follow-up. His fiance and  home health have been changing his dressings. He currently denies systemic signs of infection. He continues to put sheet covers over his face during our wound encounters because he does not want [name] look at the wound. 5/25; Patient presents for follow-up. He missed his last clinic appointment. He had [name] bone biopsy and culture done at last clinic visit that showed acute osteomyelitis with growth of E. coli, stenotrophomonas maltophilia and enteroccocus faecalis. [name] referral to infectious disease was made. He has not heard from them yet. He has been using Dakin's wet-to-dry dressings. He is now more comfortable with looking at the wound bed. 6/6; the patient comes into clinic today with 2 wounds [name] the left foot which apparently have been there for several months. This was not known to assess but was known by Dr. Donzetta Matters. In fact he underwent an angiogram yesterday. He had [name] laser atherectomy of the left posterior tibial artery with [name] 3 mm balloon angioplasty, also [name] stent of the last left SFA. 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 This is [name] lot better than the last time I saw it. He has been using Dakin's wet-to-dry. He has an appointment with infectious disease for [name] bone biopsy which showed E. coli, stenotrophomonas and Enterococcus faecalis. The appointment is for tomorrow 04/14/2022 Mr. Doren Kaspar ajuddin is an uncontrolled type 2 diabetic [name] insulin that presents with [name] left foot ulcer. [name] 12/18/2021 he required [name] left fifth ray amputation. He had revision of this site [name] 6/17 by Dr. Posey Pronto, podiatry. He states he has had [name] wound VAC [name] this area for the past 3 weeks. It is unclear what the prior wound care has been. He has [name] history of osteomyelitis to this area and was treated with IV and oral antibiotics For 6 weeks by Dr. Linus Salmons of infectious  disease. At his last follow-up [name] 02/20/2022 his CRP was trending down and antibiotics were stopped. He also has [name] history of peripheral arterial disease status post stenting to the left SFA and balloon angioplasty to the left posterior tibial artery [name] 12/2021. He has follow-up with vein and vascular at the end of the month. He also has [name] wound to the dorsal aspect of the left foot that he has been placing Betadine [name]. 10/19; patient presents for follow-up. He had [name] bone biopsy of the left foot at  last clinic visit showed osteomyelitis. The bone culture showed Enterobacter cloacae, Enterococcus faecalis, Staphylococcus aureus, viridans group strep, actinotigum schaelii. He has been referred to infectious disease And has an appointment [name] October 25. He has been using Dakin's wet-to-dry dressing to the lateral left foot wound and Hydrofera Blue and Medihoney to the dorsal foot wound. He has no issues or complaints today. He denies systemic signs of infection. 10/26; patient presents for follow-up. He saw Dr. Linus Salmons yesterday with infectious disease and has been prescribed doxycycline and Levaquin for his chronic osteomyelitis. He has been using Dakin's wet-to-dry dressings to the lateral left foot wound and Medihoney and Hydrofera Blue to the dorsal wound. We discussed [name] skin graft for the dorsal foot wound and he would like to proceed with insurance verification. 11/3; patient with [name] wound [name] the dorsal left foot and [name] substantial postsurgical area [name] the lateral left foot fifth ray amputation. He is also followed by Dr. Alton Revere of infectious disease. He had [name] bone biopsy from the left lateral foot that showed acute osteomyelitis. Swab culture showed multiple organisms. He has also been revascularized by infectious disease status post left SFA stenting and tibial laser atherectomy and angioplasty. An MRI of the left foot had previously shown osteomyelitis of the fifth met head we have been using Dakin's  wet-to-dry to the left lateral foot and Medihoney and Hydrofera Blue to the wound [name] the dorsal foot. 11/13; HBO treatment was discussed at last clinic visit and patient would like to proceed with this. He has orientation today. He has been approved for Grafix. We have been using Hydrofera Blue and Medihoney to the left dorsal foot and collagen to the left lateral foot. Patient has home health that comes in for dressing changes. He currently has no issues or complaints today. 12/7; patient missed his last clinic appointment. He was last seen 3 weeks ago. He is currently off of antibiotics per ID. He completed [name] prolonged treatment. He has been using collagen to the lateral foot wound. He has been using Medihoney and Hydrofera Blue to the dorsal foot wound. He has been doing well with hyperbaric oxygen therapy. He has no issues or complaints today. He denies signs of of infection. 12/14; patient presents for follow-up. We have been using Grafix to the wound beds. He has had trouble equalizing the pressure in his ears during HBO treatments. It was recommended he follow-up with ENT He does not have an appointment yet. He currently denies signs of infection. . 12/21; patient presents for follow-up. He was seen by ENT and had tubes placed. He is continued HBO without issues. We have been using Grafix to the wound beds. There is been improvement in wound healing. 12/21; patient presents for follow-up. He has no issues or complaints today. There is been improvement in wound healing. 1/5; patient presents for follow-up. At last clinic visit Grafix was placed in standard fashion. He has no issues or complaints today. He is tolerating hyperbaric oxygen therapy well. He denies signs of infection. 1/11; patient presents for follow-up. We have been placing Grafix to the wound beds [name] the left foot. The dorsal left foot wound has healed. The left lateral foot wound has shown improvement in healing. Unfortunately  has had dehiscence of his previous surgical wound site [name] the right foot. He currently denies signs of infection. 1/19; patient presents for follow-up. Grafix was placed to the left foot wound at last clinic visit. He has been using Dakin's wet-to-dry dressings to  the right foot wound. He continues HBO therapy without issues. He has shown improvement in wound healing to the left foot. 1/26; patient presents for follow-up. We been placing Grafix to the left foot wound and patient has been using Dakin's wet-to-dry dressings to the right foot wound. There is been improvement in wound healing. He continues HBO therapy without issues. Electronic Signature(s) Signed: 08/03/2022 11:13:35 AM By: [name] Shan DO Entered By: [name] [name] [name] 12:10:27 [name] ([id number]) [id number].pdf Page 5 of 14 -------------------------------------------------------------------------------- Physical Exam Details Patient Name: Date of Service: [name], [name]. 07/31/2022 11:00 [name] Medical Record Number: [id number] Patient Account Number: [id number] Date of Birth/Sex: Treating RN: 07/11/1955 (67 y.o. M) Primary Care Provider: Roselee [name] Other Clinician: Referring Provider: Treating Provider/Extender: [name], [name] in Treatment: 15 Constitutional respirations regular, non-labored and within target range for patient.. Cardiovascular 2+ dorsalis pedis/posterior tibialis pulses. Psychiatric pleasant and cooperative. Notes Left foot: T the lateral aspect there is an open wound with granulation tissue and nonviable tissue. No probing to bone . Right foot: Along the previous TMA o site there is an open wound with nonviable tissue, granulation tissue and callus. There is increased depth In the center. Does not probe to bone. No signs of surrounding soft tissue infection. Electronic Signature(s) Signed: 08/03/2022 11:13:35 AM By: [name] Shan DO Entered By: [name] [name] [name] 12:10:58 -------------------------------------------------------------------------------- Physician Orders Details Patient Name: Date of Service: [name], [place] [name]. 07/31/2022 11:00 [name] Medical Record Number: [id number] Patient Account Number: [id number] Date of Birth/Sex: Treating RN: 05/10/1956 (67 y.o. [name], [name] Primary Care Provider: Roselee [name] Other Clinician: Referring Provider: Treating Provider/Extender: [name] in Treatment: 15 Verbal / Phone Orders: No Diagnosis Coding ICD-10 Coding Code Description [phone] Non-pressure chronic ulcer of other part of left foot with fat layer exposed M86.172 Other acute osteomyelitis, left ankle and foot L97.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 specified severity E11.621 Type 2 diabetes mellitus with foot ulcer E11.42 Type 2 diabetes mellitus with diabetic polyneuropathy S91.301A Unspecified open wound, right foot, initial encounter 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 severity Follow-up Appointments ppointment in 1 week. - Dr. Heber [place] 08/07/2022 1100 Friday Return [name] ppointment in 2 weeks. - w/ Dr. Heber [place] Thursday 1015 08/13/2022 Return [name] Anesthetic (In clinic) Topical Lidocaine 5% applied to wound bed Cellular or Tissue Based Products DERRICO, ZHONG [name] ([id number]) [name].pdf Page 6 of 14 Cellular or Tissue Based Product Type: - Run IVR for Grafix for left dorsal foot- pending Grafix # 1 applied 06/11/22 Grafix # 2 applied 06/18/22 Grafix # 3 applied 06/25/22 GRafix # 4 applied 07/02/22 Grafix # 5 applied 07/10/22 Grafix # 6 applied 07/17/22 Grafix # 7 applied 07/24/22---Also, go ahead and run IVR for more Grafix for left lateral foot Grafix #8 applied 07/31/2022 Cellular or Tissue Based Product applied to wound bed, secured with  steri-strips, cover with Adaptic or Mepitel. (DO NOT REMOVE). Edema Control - Lymphedema / SCD / Other Avoid standing for long periods of time. Off-Loading Open toe surgical shoe to: - wear when standing or walking. Hyperbaric Oxygen Therapy Evaluate for HBO Therapy - 07/24/22- Pt. to finish next 8 HBO treatments 07/24/22- DR. Claron Rosencrans wants pt. to be extended for 40 more treatments Indication: - Wagner Grade III to left lateral foot 2.5 ATA for 90 Minutes with 2 Five (5) Minute [name] Breaks ir  Total Number of Treatments: - 40; extend for 40 more One treatments per day (delivered Monday through Friday unless otherwise specified in Special Instructions below): Finger stick Blood Glucose Pre- and Post- HBOT Treatment. Follow Hyperbaric Oxygen Glycemia Protocol Afrin (Oxymetazoline HCL) 0.05% nasal spray - 1 spray in both nostrils daily as needed prior to HBO treatment for difficulty clearing ears Wound Treatment Wound #5 - Foot Wound Laterality: Left, Lateral Cleanser: Wound Cleanser (Generic) 1 x Per Day/30 Days Discharge Instructions: Cleanse the wound with wound cleanser prior to applying [name] clean dressing using gauze sponges, not tissue or cotton balls. Peri-Wound Care: Skin Prep (Generic) 1 x Per Day/30 Days Discharge Instructions: Use skin prep as directed Prim Dressing: Grafix (Generic) 1 x Per Day/30 Days ary Secondary Dressing: ABD Pad, 8x10 (Generic) 1 x Per Day/30 Days Discharge Instructions: Apply over primary dressing as directed. Secured With: [employer], 4.5x3.1 (in/yd) (Generic) 1 x Per Day/30 Days Discharge Instructions: Secure with Kerlix as directed. Secured With: 32M Medipore H Soft Cloth Surgical T ape, 4 x 10 (in/yd) (Generic) 1 x Per Day/30 Days Discharge Instructions: Secure with tape as directed. Wound #6 - Amputation Site - Transmetatarsal Wound Laterality: Right Cleanser: Wound Cleanser (Generic) 1 x Per Day/30 Days Discharge Instructions: Cleanse the wound  with wound cleanser prior to applying [name] clean dressing using gauze sponges, not tissue or cotton balls. Prim Dressing: Dakin's Solution 0.25%, 16 (oz) 1 x Per Day/30 Days ary Discharge Instructions: Moisten gauze with Dakin's solution Secondary Dressing: ABD Pad, 8x10 1 x Per Day/30 Days Discharge Instructions: Apply over primary dressing as directed. Secondary Dressing: Woven Gauze Sponge, Non-Sterile 4x4 in (Generic) 1 x Per Day/30 Days Discharge Instructions: Apply over primary dressing as directed. Secured With: [employer], 4.5x3.1 (in/yd) (Generic) 1 x Per Day/30 Days Discharge Instructions: Secure with Kerlix as directed. GLYCEMIA INTERVENTIONS PROTOCOL PRE-HBO GLYCEMIA INTERVENTIONS ACTION INTERVENTION Obtain pre-HBO capillary blood glucose (ensure 1 physician order is in chart). [name]. Notify HBO physician and await physician orders. 2 If result is 70 mg/dl or below: B. If the result meets the hospital definition of [name] critical result, follow hospital policy. [name]. Give patient an 8 ounce Glucerna Shake, an 8 ounce Ensure, or 8 ounces of [name] Glucerna/Ensure equivalent dietary supplement*. B. Wait 30 minutes. If result is 71 mg/dl to 130 mg/dl: C. Retest patients capillary blood glucose (CBG). D. If result greater than or equal to 110 mg/dl, TOMASZ, STEEVES [name] ([id number]) [id number].pdf Page 7 of 14 proceed with HBO. If result less than 110 mg/dl, notify HBO physician and consider holding HBO. If result is 131 mg/dl to 249 mg/dl: [name]. Proceed with HBO. [name]. Notify HBO physician and await physician orders. B. It is recommended to hold HBO and do If result is 250 mg/dl or greater: blood/urine ketone testing. C. If the result meets the hospital definition of [name] critical result, follow hospital policy. POST-HBO GLYCEMIA INTERVENTIONS ACTION INTERVENTION Obtain post HBO capillary blood glucose (ensure 1 physician order is in chart). [name]. Notify HBO  physician and await physician orders. 2 If result is 70 mg/dl or below: B. If the result meets the hospital definition of [name] critical result, follow hospital policy. [name]. Give patient an 8 ounce Glucerna Shake, an 8 ounce Ensure, or 8 ounces of [name] Glucerna/Ensure equivalent dietary supplement*. B. Wait 15 minutes for symptoms of If result is 71 mg/dl to 100 mg/dl: hypoglycemia (i.e. nervousness, anxiety, sweating, chills, clamminess, irritability, confusion, tachycardia or dizziness). C. If patient asymptomatic,  discharge patient. If patient symptomatic, repeat capillary blood glucose (CBG) and notify HBO physician. If result is 101 mg/dl to 249 mg/dl: [name]. Discharge patient. [name]. Notify HBO physician and await physician orders. B. It is recommended to do blood/urine ketone If result is 250 mg/dl or greater: testing. C. If the result meets the hospital definition of [name] critical result, follow hospital policy. *Juice or candies are NOT equivalent products. If patient refuses the Glucerna or Ensure, please consult the hospital dietitian for an appropriate substitute. Electronic Signature(s) Signed: 08/03/2022 11:13:35 AM By: [name] Shan DO Entered By: [name] [name] [name] 12:11:05 -------------------------------------------------------------------------------- Problem List Details Patient Name: Date of Service: [name], [name]. 07/31/2022 11:00 [name] Medical Record Number: [id number] Patient Account Number: [id number] Date of Birth/Sex: Treating RN: 04-05-1956 (67 y.o. [name], [name] Primary Care Provider: Roselee [name] Other Clinician: Referring Provider: Treating Provider/Extender: [name] in Treatment: 15 Active Problems ICD-10 Encounter Code Description Active Date MDM Diagnosis [phone] Non-pressure chronic ulcer of other part of left foot with fat layer exposed 04/14/2022 No Yes M86.172 Other acute osteomyelitis, left ankle and  foot 05/08/2022 No Yes L97.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 specified 04/14/2022 No Yes severity E11.621 Type 2 diabetes mellitus with foot ulcer 04/14/2022 No Yes ETHRIDGE, SOLLENBERGER [name] ([id number]) [phone].pdf Page 8 of 14 E11.42 Type 2 diabetes mellitus with diabetic polyneuropathy 04/14/2022 No Yes S91.301A Unspecified open wound, right foot, initial encounter 07/16/2022 No Yes 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 07/16/2022 No Yes severity Inactive Problems Resolved Problems Electronic Signature(s) Signed: 08/03/2022 11:13:35 AM By: [name] Shan DO Entered By: [name] [name] [name] 12:06:28 -------------------------------------------------------------------------------- Progress Note Details Patient Name: Date of Service: [name], [name]. 07/31/2022 11:00 [name] Medical Record Number: [id number] Patient Account Number: [id number] Date of Birth/Sex: Treating RN: 03-25-1956 (67 y.o. M) Primary Care Provider: Roselee [name] Other Clinician: Referring Provider: Treating Provider/Extender: [name], [name] in Treatment: 15 Subjective Chief Complaint Information obtained from Patient 10/16/20; patient is here with [name] substantial wound [name] his right foot in the setting of [name] recent transmetatarsal amputation. 04/14/2022; referral from podiatry for postsurgical wound of nonhealing partial left fifth ray amputation, right foot (07/16/22): TMA dehiscence History of Present Illness (HPI) ADMISSION 10/16/20 This is [name] 67 year old man who is [name] type II diabetic. Initially seen by Dr. Unk Lightning of vein and vascular [name] 09/10/2021 for bilateral lower extremity rest pain right greater than left. His ABIs demonstrated monophasic waveforms at the ankles bilaterally with depressed toe pressures. His ABI in the right was 0.5 and [name] the left 0.28. There were no obtainable  waveforms for TBI's. He underwent angiography [name] 09/10/2021 and had findings of severe PAD with 95% stenosis of the distal SFA. He also had peroneal and posterior tibial arteries occluded with significant collateralization in the calf there was reconstitution of the posterior tibial artery in the distal third of the calf. Ostia of the anterior tibial artery was not appreciated. He underwent [name] angioplasty of the superficial femoral artery. He required admission to hospital from 09/12/2021 through 09/22/2021 with right foot cellulitis and sepsis. [name] 3/13 he underwent [name] right below-knee popliteal to posterior tibial bypass using [name] greater saphenous vein. Ultimately however he required [name] right TMA by podiatry which I believe was done [name] 3/17 for progressive diabetic foot infection. He was discharged to [facility] skilled facility. He arrives in clinic  today with [name] large wound area over the entirety of his amputation site extending proximally. The attempted flap closure of the amputation site and his TMA has failed and the wound extends under the attempt to closure. Still some sutures in place. There is an odor but he is not systemically unwell. He is due for follow-up arterial studies and has an appointment with vascular surgery [name] 10/28/2021. They are using wet-to-dry dressings at [facility]. Past medical history includes type 2 diabetes with peripheral neuropathy, hypertension, obstructive sleep apnea 4/20; patient presents for follow-up. He is being discharged from his facility at [facility] today. They have been doing Dakin's wet-to-dry dressings. He states that his fiance can help with dressing changes. He is getting [facility] to come out 3 times [name] week once he leaves the facility. He is scheduled to see vein and vascular [name] 5/12. He has not seen the wound himself. He puts covers over his face for wound exams. He currently denies systemic signs of infection. 4/28; patient admitted to the clinic 2  weeks ago with [name] dehisced right TMA in the setting of type 2 diabetes with significant PAD. He is status post revascularization. He has been discharged from the nursing home he was at and now is at home using Dakin's wet-to-dry dressings daily he has home health. He denies any systemic signs of infection 5/4; patient presents for follow-up. His fiance and home health have been changing his dressings. He currently denies systemic signs of infection. He continues to put sheet covers over his face during our wound encounters because he does not want [name] look at the wound. 5/25; Patient presents for follow-up. He missed his last clinic appointment. He had [name] bone biopsy and culture done at last clinic visit that showed acute [name], [name] ([id number]) [name].pdf Page 9 of 14 osteomyelitis with growth of E. coli, stenotrophomonas maltophilia and enteroccocus faecalis. [name] referral to infectious disease was made. He has not heard from them yet. He has been using Dakin's wet-to-dry dressings. He is now more comfortable with looking at the wound bed. 6/6; the patient comes into clinic today with 2 wounds [name] the left foot which apparently have been there for several months. This was not known to assess but was known by Dr. Donzetta Matters. In fact he underwent an angiogram yesterday. He had [name] laser atherectomy of the left posterior tibial artery with [name] 3 mm balloon angioplasty, also [name] stent of the last left SFA. 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 This is [name] lot better than the last time I saw it. He has been using Dakin's wet-to-dry. He has an appointment with infectious disease for [name] bone biopsy which showed E. coli, stenotrophomonas and Enterococcus faecalis. The appointment is for tomorrow 04/14/2022 Mr. Johnavon Mcclafferty ajuddin is  an uncontrolled type 2 diabetic [name] insulin that presents with [name] left foot ulcer. [name] 12/18/2021 he required [name] left fifth ray amputation. He had revision of this site [name] 6/17 by Dr. Posey Pronto, podiatry. He states he has had [name] wound VAC [name] this area for the past 3 weeks. It is unclear what the prior wound care has been. He has [name] history of osteomyelitis to this area and was treated with IV and oral antibiotics For 6 weeks by Dr. Linus Salmons of infectious disease. At his  last follow-up [name] 02/20/2022 his CRP was trending down and antibiotics were stopped. He also has [name] history of peripheral arterial disease status post stenting to the left SFA and balloon angioplasty to the left posterior tibial artery [name] 12/2021. He has follow-up with vein and vascular at the end of the month. He also has [name] wound to the dorsal aspect of the left foot that he has been placing Betadine [name]. 10/19; patient presents for follow-up. He had [name] bone biopsy of the left foot at last clinic visit showed osteomyelitis. The bone culture showed Enterobacter cloacae, Enterococcus faecalis, Staphylococcus aureus, viridans group strep, actinotigum schaelii. He has been referred to infectious disease And has an appointment [name] October 25. He has been using Dakin's wet-to-dry dressing to the lateral left foot wound and Hydrofera Blue and Medihoney to the dorsal foot wound. He has no issues or complaints today. He denies systemic signs of infection. 10/26; patient presents for follow-up. He saw Dr. Linus Salmons yesterday with infectious disease and has been prescribed doxycycline and Levaquin for his chronic osteomyelitis. He has been using Dakin's wet-to-dry dressings to the lateral left foot wound and Medihoney and Hydrofera Blue to the dorsal wound. We discussed [name] skin graft for the dorsal foot wound and he would like to proceed with insurance verification. 11/3; patient with [name] wound [name] the dorsal left foot and [name] substantial postsurgical area [name] the lateral left  foot fifth ray amputation. He is also followed by Dr. Alton Revere of infectious disease. He had [name] bone biopsy from the left lateral foot that showed acute osteomyelitis. Swab culture showed multiple organisms. He has also been revascularized by infectious disease status post left SFA stenting and tibial laser atherectomy and angioplasty. An MRI of the left foot had previously shown osteomyelitis of the fifth met head we have been using Dakin's wet-to-dry to the left lateral foot and Medihoney and Hydrofera Blue to the wound [name] the dorsal foot. 11/13; HBO treatment was discussed at last clinic visit and patient would like to proceed with this. He has orientation today. He has been approved for Grafix. We have been using Hydrofera Blue and Medihoney to the left dorsal foot and collagen to the left lateral foot. Patient has home health that comes in for dressing changes. He currently has no issues or complaints today. 12/7; patient missed his last clinic appointment. He was last seen 3 weeks ago. He is currently off of antibiotics per ID. He completed [name] prolonged treatment. He has been using collagen to the lateral foot wound. He has been using Medihoney and Hydrofera Blue to the dorsal foot wound. He has been doing well with hyperbaric oxygen therapy. He has no issues or complaints today. He denies signs of of infection. 12/14; patient presents for follow-up. We have been using Grafix to the wound beds. He has had trouble equalizing the pressure in his ears during HBO treatments. It was recommended he follow-up with ENT He does not have an appointment yet. He currently denies signs of infection. . 12/21; patient presents for follow-up. He was seen by ENT and had tubes placed. He is continued HBO without issues. We have been using Grafix to the wound beds. There is been improvement in wound healing. 12/21; patient presents for follow-up. He has no issues or complaints today. There is been improvement in wound  healing. 1/5; patient presents for follow-up. At last clinic visit Grafix was placed in standard fashion. He has no issues or complaints today. He is tolerating hyperbaric  oxygen therapy well. He denies signs of infection. 1/11; patient presents for follow-up. We have been placing Grafix to the wound beds [name] the left foot. The dorsal left foot wound has healed. The left lateral foot wound has shown improvement in healing. Unfortunately has had dehiscence of his previous surgical wound site [name] the right foot. He currently denies signs of infection. 1/19; patient presents for follow-up. Grafix was placed to the left foot wound at last clinic visit. He has been using Dakin's wet-to-dry dressings to the right foot wound. He continues HBO therapy without issues. He has shown improvement in wound healing to the left foot. 1/26; patient presents for follow-up. We been placing Grafix to the left foot wound and patient has been using Dakin's wet-to-dry dressings to the right foot wound. There is been improvement in wound healing. He continues HBO therapy without issues. Patient History Information obtained from Patient, Chart. Family History Diabetes - Mother,Siblings, Heart Disease - Siblings, Hypertension - Siblings, Stroke - Mother, No family history of Cancer, Hereditary Spherocytosis, Kidney Disease, Lung Disease, Seizures, Thyroid Problems, Tuberculosis. Social History Former smoker, Marital Status - Single, Alcohol Use - Never, Drug Use - No History, Caffeine Use - Daily. Medical History Eyes Denies history of Cataracts, Glaucoma, Optic Neuritis Respiratory Patient has history of Asthma, Sleep Apnea Denies history of Aspiration, Chronic Obstructive Pulmonary Disease (COPD), Pneumothorax, Tuberculosis Cardiovascular Patient has history of Hypertension, Peripheral Venous Disease Denies history of Angina, Arrhythmia, Congestive Heart Failure, Coronary Artery Disease, Deep Vein Thrombosis,  Hypotension, Myocardial Infarction, Peripheral Arterial Disease, Phlebitis, Vasculitis Endocrine Patient has history of Type II Diabetes Denies history of Type I Diabetes Integumentary (Skin) Denies history of History of Burn Musculoskeletal Patient has history of Osteomyelitis Denies history of Gout, Rheumatoid Arthritis, Osteoarthritis TAHMID, STONEHOCKER [name] ([id number]) [id number].pdf Page 10 of 14 Neurologic Patient has history of Neuropathy Denies history of Dementia, Quadriplegia, Paraplegia, Seizure Disorder Hospitalization/Surgery History - 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 Posey Pronto. - aortogram 12/08/2021 Dr. Virl Cagey [facility]. Medical [name] Surgical History Notes nd Psychiatric PTSD Objective Constitutional respirations regular, non-labored and within target range for patient.. Vitals Time Taken: 11:15 AM, Height: 72 in, Weight: 220 lbs, BMI: 29.8, Temperature: 98.3 F, Pulse: 94 bpm, Respiratory Rate: 18 breaths/min, Blood Pressure: 177/94 mmHg, Capillary Blood Glucose: 186 mg/dl. Cardiovascular 2+ dorsalis pedis/posterior tibialis pulses. Psychiatric pleasant and cooperative. General Notes: Left foot: T the lateral aspect there is an open wound with granulation tissue and nonviable tissue. No probing to bone . Right foot: Along the o previous TMA site there is an open wound with nonviable tissue, granulation tissue and callus. There is increased depth In the center. Does not probe to bone. No signs of surrounding soft tissue infection. Integumentary (Hair, Skin) Wound #5 status is Open. Original cause of wound was Surgical Injury. The date acquired was: 12/20/2021. The wound has been in treatment 15 weeks. The wound is located [name] the Left,Lateral Foot. The wound measures 4.6cm length x 0.8cm width x 0.3cm depth; 2.89cm^2 area and 0.867cm^3 volume. There is Fat Layer (Subcutaneous Tissue) exposed. There is no tunneling or undermining  noted. There is [name] medium amount of serosanguineous drainage noted. The wound margin is epibole. There is large (67-100%) red, pink granulation within the wound bed. There is [name] small (1-33%) amount of necrotic tissue within the wound bed including Adherent Slough. The periwound skin appearance had no abnormalities noted for color. The periwound skin appearance exhibited: Callus, Maceration. The periwound skin appearance did not  exhibit: Crepitus, Excoriation, Induration, Rash, Scarring, Dry/Scaly. Periwound temperature was noted as No Abnormality. Wound #6 status is Open. Original cause of wound was Gradually Appeared. The date acquired was: 07/16/2022. The wound has been in treatment 2 weeks. The wound is located [name] the Right Amputation Site - Transmetatarsal. The wound measures 0.2cm length x 0.5cm width x 0.9cm depth; 0.079cm^2 area and 0.071cm^3 volume. There is Fat Layer (Subcutaneous Tissue) exposed. There is no tunneling or undermining noted. There is [name] medium amount of serosanguineous drainage noted. There is large (67-100%) red granulation within the wound bed. There is no necrotic tissue within the wound bed. The periwound skin appearance exhibited: Callus. The periwound skin appearance did not exhibit: Crepitus, Excoriation, Induration, Rash, Scarring, Dry/Scaly, Maceration, Atrophie Blanche, Cyanosis, Ecchymosis, Hemosiderin Staining, Mottled, Pallor, Rubor, Erythema. Assessment Active Problems ICD-10 Non-pressure chronic ulcer of other part of left foot with fat layer exposed Other acute osteomyelitis, left ankle and foot Non-pressure chronic ulcer of other part of left foot with other specified severity Type 2 diabetes mellitus with foot ulcer Type 2 diabetes mellitus with diabetic polyneuropathy Unspecified open wound, right foot, initial encounter 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 severity Patient's wounds have shown improvement in size in  appearance since last clinic visit. I debrided nonviable tissue. I recommended continuing Dakin's wet-to- dry dressings to the right foot. Grafix was placed in standard fashion to the left lateral foot wound. Continue HBO therapy. He has done very well with this treatment plan and weekly shows improvement in his wound healing. Procedures Wound #5 Pre-procedure diagnosis of Wound #5 is [name] Diabetic Wound/Ulcer of the Lower Extremity located [name] the Left,Lateral Foot .Severity of Tissue Pre Debridement is: Fat layer exposed. There was [name] Excisional Skin/Subcutaneous Tissue Debridement with [name] total area of 4.6 sq cm performed by [name] Shan, DO. With the following instrument(s): Curette to remove Viable and Non-Viable tissue/material. Material removed includes Callus, Subcutaneous Tissue, Tyron, [place], [place] [name] ([id number]) [name].pdf Page 11 of 14 Skin: Dermis, and Skin: Epidermis after achieving pain control using Lidocaine 4% Topical Solution. [name] time out was conducted at 11:45, prior to the start of the procedure. [name] Minimum amount of bleeding was controlled with Pressure. The procedure was tolerated well with [name] pain level of 0 throughout and [name] pain level of 0 following the procedure. Post Debridement Measurements: 4.6cm length x 0.8cm width x 0.3cm depth; 0.867cm^3 volume. Character of Wound/Ulcer Post Debridement is improved. Severity of Tissue Post Debridement is: Fat layer exposed. Post procedure Diagnosis Wound #5: Same as Pre-Procedure Pre-procedure diagnosis of Wound #5 is [name] Diabetic Wound/Ulcer of the Lower Extremity located [name] the Left,Lateral Foot. [name] skin graft procedure using [name] bioengineered skin substitute/cellular or tissue based product was performed by [name] Shan, DO with the following instrument(s): Forceps and Scissors. Grafix prime was applied and secured with Steri-Strips and adaptic. 6 sq cm of product was utilized and 0 sq cm was wasted. Post  Application, no dressing was applied. [name] Time Out was conducted at 11:55, prior to the start of the procedure. The procedure was tolerated well with [name] pain level of 0 throughout and [name] pain level of 0 following the procedure. Post procedure Diagnosis Wound #5: Same as Pre-Procedure . Wound #6 Pre-procedure diagnosis of Wound #6 is [name] Diabetic Wound/Ulcer of the Lower Extremity located [name] the Right Amputation Site - Transmetatarsal .Severity of Tissue Pre Debridement is: Fat layer exposed. There was [name] Excisional Skin/Subcutaneous Tissue Debridement with  [name] total area of 1 sq cm performed by [name] Shan, DO. With the following instrument(s): Curette to remove Viable and Non-Viable tissue/material. Material removed includes Callus, Subcutaneous Tissue, Slough, Skin: Dermis, and Skin: Epidermis after achieving pain control using Lidocaine 4% T opical Solution. [name] time out was conducted at 11:45, prior to the start of the procedure. [name] Minimum amount of bleeding was controlled with Pressure. The procedure was tolerated well with [name] pain level of 0 throughout and [name] pain level of 0 following the procedure. Post Debridement Measurements: 0.2cm length x 0.5cm width x 0.9cm depth; 0.071cm^3 volume. Character of Wound/Ulcer Post Debridement is improved. Severity of Tissue Post Debridement is: Fat layer exposed. Post procedure Diagnosis Wound #6: Same as Pre-Procedure Plan Follow-up Appointments: Return Appointment in 1 week. - Dr. Heber Byron 08/07/2022 1100 Friday Return Appointment in 2 weeks. - w/ Dr. Heber [place] Thursday 1015 08/13/2022 Anesthetic: (In clinic) Topical Lidocaine 5% applied to wound bed Cellular or Tissue Based Products: Cellular or Tissue Based Product Type: - Run IVR for Grafix for left dorsal foot- pending Grafix # 1 applied 06/11/22 Grafix # 2 applied 06/18/22 Grafix # 3 applied 06/25/22 GRafix # 4 applied 07/02/22 Grafix # 5 applied 07/10/22 Grafix # 6 applied 07/17/22 Grafix # 7 applied  07/24/22---Also, go ahead and run IVR for more Grafix for left lateral foot Grafix #8 applied 07/31/2022 Cellular or Tissue Based Product applied to wound bed, secured with steri-strips, cover with Adaptic or Mepitel. (DO NOT REMOVE). Edema Control - Lymphedema / SCD / Other: Avoid standing for long periods of time. Off-Loading: Open toe surgical shoe to: - wear when standing or walking. Hyperbaric Oxygen Therapy: Evaluate for HBO Therapy - 07/24/22- Pt. to finish next 8 HBO treatments 07/24/22- DR. Hawthorne Day wants pt. to be extended for 40 more treatments Indication: - Wagner Grade III to left lateral foot 2.5 ATA for 90 Minutes with 2 Five (5) Minute Air Breaks T Number of Treatments: - 40; extend for 40 more otal One treatments per day (delivered Monday through Friday unless otherwise specified in Special Instructions below): Finger stick Blood Glucose Pre- and Post- HBOT Treatment. Follow Hyperbaric Oxygen Glycemia Protocol Afrin (Oxymetazoline HCL) 0.05% nasal spray - 1 spray in both nostrils daily as needed prior to HBO treatment for difficulty clearing ears WOUND #5: - Foot Wound Laterality: Left, Lateral Cleanser: Wound Cleanser (Generic) 1 x Per Day/30 Days Discharge Instructions: Cleanse the wound with wound cleanser prior to applying [name] clean dressing using gauze sponges, not tissue or cotton balls. Peri-Wound Care: Skin Prep (Generic) 1 x Per Day/30 Days Discharge Instructions: Use skin prep as directed Prim Dressing: Grafix (Generic) 1 x Per Day/30 Days ary Secondary Dressing: ABD Pad, 8x10 (Generic) 1 x Per Day/30 Days Discharge Instructions: Apply over primary dressing as directed. Secured With: [employer], 4.5x3.1 (in/yd) (Generic) 1 x Per Day/30 Days Discharge Instructions: Secure with Kerlix as directed. Secured With: 64M Medipore H Soft Cloth Surgical T ape, 4 x 10 (in/yd) (Generic) 1 x Per Day/30 Days Discharge Instructions: Secure with tape as directed. WOUND  #6: - Amputation Site - Transmetatarsal Wound Laterality: Right Cleanser: Wound Cleanser (Generic) 1 x Per Day/30 Days Discharge Instructions: Cleanse the wound with wound cleanser prior to applying [name] clean dressing using gauze sponges, not tissue or cotton balls. Prim Dressing: Dakin's Solution 0.25%, 16 (oz) 1 x Per Day/30 Days ary Discharge Instructions: Moisten gauze with Dakin's solution Secondary Dressing: ABD Pad, 8x10 1 x Per Day/30 Days Discharge Instructions:  Apply over primary dressing as directed. Secondary Dressing: Woven Gauze Sponge, Non-Sterile 4x4 in (Generic) 1 x Per Day/30 Days Discharge Instructions: Apply over primary dressing as directed. Secured With: [employer], 4.5x3.1 (in/yd) (Generic) 1 x Per Day/30 Days Discharge Instructions: Secure with Kerlix as directed. 1. In office sharp debridement 2. Grafix placed in standard fashion 3. Aggressive offloadingoosurgical shoes with peg assist 4. Dakin's wet-to-dry dressings 5. Follow-up in 1 week 6. Continue HBO therapy VIVIANO, BIR [name] ([id number]) [name].pdf Page 12 of 14 Electronic Signature(s) Signed: 08/03/2022 11:13:35 AM By: [name] Shan DO Entered By: [name] [name] [name] 12:12:00 -------------------------------------------------------------------------------- HxROS Details Patient Name: Date of Service: [name], [name]. 07/31/2022 11:00 [name] Medical Record Number: [id number] Patient Account Number: [id number] Date of Birth/Sex: Treating RN: 10-08-1955 (67 y.o. M) Primary Care Provider: Roselee [name] Other Clinician: Referring Provider: Treating Provider/Extender: [name] in Treatment: 15 Information Obtained From Patient Chart Eyes Medical History: Negative for: Cataracts; Glaucoma; Optic Neuritis Respiratory Medical History: Positive for: Asthma; Sleep Apnea Negative for: Aspiration; Chronic Obstructive Pulmonary  Disease (COPD); Pneumothorax; Tuberculosis Cardiovascular Medical History: Positive for: Hypertension; Peripheral Venous Disease Negative for: Angina; Arrhythmia; Congestive Heart Failure; Coronary Artery Disease; Deep Vein Thrombosis; Hypotension; Myocardial Infarction; Peripheral Arterial Disease; Phlebitis; Vasculitis Endocrine Medical History: Positive for: Type II Diabetes Negative for: Type I Diabetes Time with diabetes: 20 years Treated with: Insulin, Oral agents Blood sugar tested every day: Yes Tested : 4x day Integumentary (Skin) Medical History: Negative for: History of Burn Musculoskeletal Medical History: Positive for: Osteomyelitis Negative for: Gout; Rheumatoid Arthritis; Osteoarthritis Neurologic Medical History: Positive for: Neuropathy Negative for: Dementia; Quadriplegia; Paraplegia; Seizure Disorder Psychiatric Medical History: Past Medical History Notes: PTSD Immunizations Pneumococcal Vaccine: Received Pneumococcal Vaccination: No JAYDIN, BONIFACE [name] ([id number]) [phone].pdf Page 13 of 14 Implantable Devices None Hospitalization / Surgery History Type of Hospitalization/Surgery 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 Posey Pronto aortogram 12/08/2021 Dr. Virl Cagey [facility] Family and Social History Cancer: No; Diabetes: Yes - Mother,Siblings; Heart Disease: Yes - Siblings; Hereditary Spherocytosis: No; Hypertension: Yes - Siblings; Kidney Disease: No; Lung Disease: No; Seizures: No; Stroke: Yes - Mother; Thyroid Problems: No; Tuberculosis: No; Former smoker; Marital Status - Single; Alcohol Use: Never; Drug Use: No History; Caffeine Use: Daily; Financial Concerns: No; Food, Clothing or Shelter Needs: No; Support System Lacking: No; Transportation Concerns: No Electronic Signature(s) Signed: 08/03/2022 11:13:35 AM By: [name] Shan DO Entered By: [name] [name] [name]  12:10:32 -------------------------------------------------------------------------------- SuperBill Details Patient Name: Date of Service: [name], [name]. 07/31/2022 Medical Record Number: [id number] Patient Account Number: [id number] Date of Birth/Sex: Treating RN: [birth year] (67 y.o. [name], [name] Primary Care Provider: Roselee [name] Other Clinician: Referring Provider: Treating Provider/Extender: [name], [name] in Treatment: 15 Diagnosis Coding ICD-10 Codes Code Description [phone] Non-pressure chronic ulcer of other part of left foot with fat layer exposed M86.172 Other acute osteomyelitis, left ankle and foot L97.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 specified severity E11.621 Type 2 diabetes mellitus with foot ulcer E11.42 Type 2 diabetes mellitus with diabetic polyneuropathy S91.301A Unspecified open wound, right foot, initial encounter 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 severity Facility Procedures : CPT4 Code: 32355732 Description: Q4133- Grafix PL 2X3 sq cm (6 units) Modifier: Quantity: 6 : CPT4 Code: 20254270 Description: 62376 - SKIN SUB GRAFT FACE/NK/HF/G ICD-10 Diagnosis Description L97.522 Non-pressure chronic ulcer of other part of  left foot with fat layer exposed Modifier: Quantity: 1 : CPT4 Code: 47425956 Description: 38756 - DEB SUBQ TISSUE 20 SQ CM/< ICD-10 Diagnosis Description E33.295J Unspecified open wound, right foot, initial encounter E11.621 Type 2 diabetes mellitus with foot ulcer Modifier: Quantity: 1 Physician Procedures : CPT4 Code Description Modifier [id number] 63016 - WC PHYS SKIN SUB GRAFT FACE/NK/HF/G ICD-10 Diagnosis Description L97.522 Non-pressure chronic ulcer of other part of left foot with fat layer exposed Quantity: 1 : 0109323 11042 - WC PHYS SUBQ TISS 20 SQ CM HADEN, SUDER [name] ([id number])  [phone].pdf P ICD-10 Diagnosis Description S91.301A Unspecified open wound, right foot, initial encounter E11.621 Type 2 diabetes mellitus with foot  ulcer Quantity: 1 age [age] Electronic Signature(s) Signed: 08/03/2022 11:13:35 AM By: [name] Shan DO Entered By: [name] [name] [name] 12:13:20

## 2022-08-03 NOTE — Progress Notes (Signed)
DEQUON, SCHNEBLY A ([id number]) [id number] Page 1 of 1 Visit Report for 07/31/2022 SuperBill Details Patient Name: Date of Service: [name] 07/31/2022 Medical Record Number: [id number] Patient Account Number: [id number] Date of Birth/Sex: Treating RN: [birth year] (67 y.o. [name] Primary Care Provider: [name] Other Clinician: [name] Referring Provider: Treating Provider/Extender: [name] in Treatment: 15 Diagnosis Coding ICD-10 Codes Code Description [phone] Non-pressure chronic ulcer of other part of left foot with fat layer exposed M86.172 Other acute osteomyelitis, left ankle and foot L97.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 specified severity E11.621 Type 2 diabetes mellitus with foot ulcer E11.42 Type 2 diabetes mellitus with diabetic polyneuropathy S91.301A Unspecified open wound, right foot, initial encounter 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 severity Facility Procedures CPT4 Code Description Modifier Quantity [id number] G0277-(Facility Use Only) HBOT full body chamber, 63mn , 4 ICD-10 Diagnosis Description E11.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 M86.172 Other acute osteomyelitis, left ankle and foot Physician Procedures Quantity CPT4 Code Description Modifier [id number] - WC PHYS HYPERBARIC OXYGEN THERAPY 1 ICD-10 Diagnosis Description E11.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 M86.172 Other acute osteomyelitis, left ankle  and foot Electronic Signature(s) Signed: 07/31/2022 1:24:00 PM By: SDonavan BurnetCHT EMT BS , , Signed: 08/03/2022 11:13:35 AM By: HKalman ShanDO Entered By: SDonavan Burneton 07/31/2022 13:24:00

## 2022-08-03 NOTE — Progress Notes (Addendum)
SHERIFF, RODENBERG [name] ([id number]) [id number] Page 1 of 3 Visit Report for 08/03/2022 Arrival Information Details Patient Name: Date of Service: [name], [name] 08/03/2022 8:00 [name] Medical Record Number: [id number] Patient Account Number: [id number] Date of Birth/Sex: Treating RN: 1955/07/10 (67 y.o. [name], [name] Primary Care Ebonie Westerlund: [name] Other Clinician: Valeria Batman Referring Nayvie Lips: Treating Jake Goodson/Extender: [name] in Treatment: 15 Visit Information History Since Last Visit All ordered tests and consults were completed: Yes Patient Arrived: Wheel Chair Added or deleted any medications: No Arrival Time: 07:38 Any new allergies or adverse reactions: No Accompanied By: none Had [name] fall or experienced change in No Transfer Assistance: None activities of daily living that may affect Patient Identification Verified: Yes risk of falls: Secondary Verification Process Completed: Yes Signs or symptoms of abuse/neglect since last visito No Patient Requires Transmission-Based Precautions: No Hospitalized since last visit: No Patient Has Alerts: Yes Pain Present Now: No Patient Alerts: ESBL;MRSA Electronic Signature(s) Signed: 08/03/2022 2:47:59 PM By: Valeria Batman EMT Entered By: Valeria Batman on 08/03/2022 14:47:59 -------------------------------------------------------------------------------- Clinic Level of Care Assessment Details Patient Name: Date of Service: [name] JUDDIN, [name] 08/03/2022 8:00 [name] Medical Record Number: [id number] Patient Account Number: [id number] Date of Birth/Sex: Treating RN: 27-Aug-1955 (67 y.o. [name] Primary Care Julita Ozbun: [name] Other Clinician: Valeria Batman Referring Malorie Bigford: Treating Kacen Mellinger/Extender: [name] in Treatment: 15 Clinic Level of Care Assessment Items TOOL 1 Quantity Score '[]'$  - 0 Use when EandM and  Procedure is performed on INITIAL visit ASSESSMENTS - Nursing Assessment / Reassessment '[]'$  - 0 General Physical Exam (combine w/ comprehensive assessment (listed just below) when performed on new pt. evals) '[]'$  - 0 Comprehensive Assessment (HX, ROS, Risk Assessments, Wounds Hx, etc.) ASSESSMENTS - Wound and Skin Assessment / Reassessment '[]'$  - 0 Dermatologic / Skin Assessment (not related to wound area) ASSESSMENTS - Ostomy and/or Continence Assessment and Care '[]'$  - 0 Incontinence Assessment and Management '[]'$  - 0 Ostomy Care Assessment and Management (repouching, etc.) PROCESS - Coordination of Care X - Simple Patient / Family Education for ongoing care 1 15 '[]'$  - 0 Complex (extensive) Patient / Family Education for ongoing care X- 1 10 Staff obtains Consents, Records, T Results / Process Orders est [name] ([id number]) [id number] Page 2 of 3 '[]'$  - 0 Staff telephones HHA, Nursing Homes / Clarify orders / etc '[]'$  - 0 Routine Transfer to another Facility (non-emergent condition) '[]'$  - 0 Routine Hospital Admission (non-emergent condition) '[]'$  - 0 New Admissions / Biomedical engineer / Ordering NPWT Apligraf, etc. , '[]'$  - 0 Emergency Hospital Admission (emergent condition) PROCESS - Special Needs '[]'$  - 0 Pediatric / Minor Patient Management '[]'$  - 0 Isolation Patient Management '[]'$  - 0 Hearing / Language / Visual special needs '[]'$  - 0 Assessment of Community assistance (transportation, D/C planning, etc.) '[]'$  - 0 Additional assistance / Altered mentation '[]'$  - 0 Support Surface(s) Assessment (bed, cushion, seat, etc.) INTERVENTIONS - Miscellaneous '[]'$  - 0 External ear exam '[]'$  - 0 Patient Transfer (multiple staff / Civil Service fast streamer / Similar devices) '[]'$  - 0 Simple Staple / Suture removal (25 or less) '[]'$  - 0 Complex Staple / Suture removal (26 or more) '[]'$  - 0 Hypo/Hyperglycemic Management (do not check if billed separately) '[]'$  - 0 Ankle /  Brachial Index (ABI) - do not check if billed separately Has the patient been seen at the hospital within the last three years: Yes Total  Score: 25 Level Of Care: New/Established - Level 1 Electronic Signature(s) Signed: 08/03/2022 3:55:45 PM By: Valeria Batman EMT Entered By: Valeria Batman on 08/03/2022 15:02:05 -------------------------------------------------------------------------------- Encounter Discharge Information Details Patient Name: Date of Service: [name], [name] 08/03/2022 8:00 [name] Medical Record Number: [id number] Patient Account Number: [id number] Date of Birth/Sex: Treating RN: 17-Nov-1955 (67 y.o. [name], [name] Primary Care Paisli Silfies: [name] Other Clinician: Valeria Batman Referring Venetta Knee: Treating Oumar Marcott/Extender: [name] in Treatment: 15 Encounter Discharge Information Items Discharge Condition: Stable Ambulatory Status: Wheelchair Discharge Destination: Home Transportation: Private Auto Accompanied By: none Schedule Follow-up Appointment: Yes Clinical Summary of Care: Electronic Signature(s) Signed: 08/03/2022 2:59:07 PM By: Valeria Batman EMT Entered By: Valeria Batman on 08/03/2022 14:59:07 [name] ([id number]) [id number] Page 3 of 3 -------------------------------------------------------------------------------- Vitals Details Patient Name: Date of Service: [name], [name] 08/03/2022 8:00 [name] Medical Record Number: [id number] Patient Account Number: [id number] Date of Birth/Sex: Treating RN: Nov 05, 1955 (67 y.o. [name], [name] Primary Care Wilhelmine Krogstad: [name] Other Clinician: Valeria Batman Referring Jiyah Torpey: Treating Shoua Ressler/Extender: [name], [name] in Treatment: 15 Vital Signs Time Taken: 08:08 Temperature (F): 97.9 Height (in): 72 Pulse (bpm): 97 Weight (lbs): 220 Respiratory Rate (breaths/min): 18 Body Mass Index (BMI):  29.8 Blood Pressure (mmHg): 103/74 Capillary Blood Glucose (mg/dl): 184 Reference Range: 80 - 120 mg / dl Notes HBO treatment was started at 0817. When the patient reach 3 PSI at travel rate of 1.0 PSI/min he complained of pressure in his upper sinuses. Travel stopped, Dr. Gwynneth Munson called to chamber side. Dr. Celine [name] stated to abort treatment. Treatment was aborted at [phone] with [name] return rate of 1.0 PSI/min. Door open at 0826. No other complaint noted. He stated that the pressure was better now. Dr. Heber Hamler was informed also. Electronic Signature(s) Signed: 08/03/2022 2:55:09 PM By: Valeria Batman EMT Previous Signature: 08/03/2022 2:48:28 PM Version By: Valeria Batman EMT Entered By: Valeria Batman on 08/03/2022 14:55:09

## 2022-08-03 NOTE — Progress Notes (Signed)
COUPER, JUNCAJ A ([id number]) [id number] Page 1 of 1 Visit Report for 08/03/2022 SuperBill Details Patient Name: Date of Service: [name] 08/03/2022 Medical Record Number: [id number] Patient Account Number: [id number] Date of Birth/Sex: Treating RN: 1955/08/11 (67 y.o. [name], [name] Primary Care Provider: [name] Other Clinician: Valeria Batman Referring Provider: Treating Provider/Extender: [name] in Treatment: 15 Diagnosis Coding ICD-10 Codes Code Description [phone] Non-pressure chronic ulcer of other part of left foot with fat layer exposed M86.172 Other acute osteomyelitis, left ankle and foot L97.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 specified severity E11.621 Type 2 diabetes mellitus with foot ulcer E11.42 Type 2 diabetes mellitus with diabetic polyneuropathy S91.301A Unspecified open wound, right foot, initial encounter 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 severity Facility Procedures CPT4 Code Description Modifier Quantity [id number] 99211 - WOUND CARE VISIT-LEV 1 EST PT 1 Electronic Signature(s) Signed: 08/03/2022 3:04:47 PM By: Valeria Batman EMT Signed: 08/03/2022 3:52:58 PM By: [name] DO Previous Signature: 08/03/2022 3:02:51 PM Version By: Valeria Batman EMT Entered By: Valeria Batman on 08/03/2022 15:04:46

## 2022-08-04 ENCOUNTER — Encounter (HOSPITAL_BASED_OUTPATIENT_CLINIC_OR_DEPARTMENT_OTHER): Payer: Medicare HMO | Admitting: Internal Medicine

## 2022-08-05 ENCOUNTER — Encounter (HOSPITAL_BASED_OUTPATIENT_CLINIC_OR_DEPARTMENT_OTHER): Payer: Medicare HMO | Admitting: Physician Assistant

## 2022-08-05 DIAGNOSIS — E11621 Type 2 diabetes mellitus with foot ulcer: Secondary | ICD-10-CM | POA: Diagnosis not present

## 2022-08-05 LAB — GLUCOSE, CAPILLARY
Glucose-Capillary: 205 mg/dL — ABNORMAL HIGH (ref 70–99)
Glucose-Capillary: 68 mg/dL — ABNORMAL LOW (ref 70–99)
Glucose-Capillary: 85 mg/dL (ref 70–99)

## 2022-08-05 NOTE — Progress Notes (Signed)
[name], MOM [name] ([id number]) [id number] Page 1 of 2 Visit Report for 08/05/2022 Problem List Details Patient Name: Date of Service: [name], [name]. 08/05/2022 8:00 [name] Medical Record Number: [id number] Patient Account Number: [id number] Date of Birth/Sex: Treating RN: Aug 29, 1955 (67 y.o. [name] Primary Care Provider: [name] Other Clinician: Valeria Batman Referring Provider: Treating Provider/Extender: [name], [name] in Treatment: 16 Active Problems ICD-10 Encounter Code Description Active Date MDM Diagnosis [phone] Non-pressure chronic ulcer of other part of left foot with fat layer 04/14/2022 No Yes exposed M86.172 Other acute osteomyelitis, left ankle and foot 05/08/2022 No Yes L97.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 04/14/2022 No Yes specified severity E11.621 Type 2 diabetes mellitus with foot ulcer 04/14/2022 No Yes E11.42 Type 2 diabetes mellitus with diabetic polyneuropathy 04/14/2022 No Yes S91.301A Unspecified open wound, right foot, initial encounter 07/16/2022 No Yes 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 07/16/2022 No Yes specified severity Inactive Problems Resolved Problems Electronic Signature(s) Signed: 08/05/2022 3:42:28 PM By: Valeria Batman EMT Signed: 08/05/2022 4:50:42 PM By: Worthy Keeler PA-C Entered By: Valeria Batman on 08/05/2022 15:42:28 SuperBill Details -------------------------------------------------------------------------------- [name] ([id number]) [id number] Page 2 of 2 Patient Name: Date of Service: [name], [name]. 08/05/2022 Medical Record Number: [id number] Patient Account Number: [id number] Date of Birth/Sex: Treating RN: 1956/02/04 (67 y.o. [name] Primary Care Provider: [name] Other Clinician: Valeria Batman Referring  Provider: Treating Provider/Extender: [name], [name] in Treatment: 16 Diagnosis Coding ICD-10 Codes Code Description [phone] Non-pressure chronic ulcer of other part of left foot with fat layer exposed M86.172 Other acute osteomyelitis, left ankle and foot L97.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 specified severity E11.621 Type 2 diabetes mellitus with foot ulcer E11.42 Type 2 diabetes mellitus with diabetic polyneuropathy S91.301A Unspecified open wound, right foot, initial encounter 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 severity Facility Procedures CPT4 Code Description Modifier Quantity 28366294 G0277-(Facility Use Only) HBOT full body chamber, 12mn , 4 ICD-10 Diagnosis Description E11.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 M86.172 Other acute osteomyelitis, left ankle and foot Physician Procedures Quantity CPT4 Code Description Modifier [id number] - WC PHYS HYPERBARIC OXYGEN THERAPY 1 ICD-10 Diagnosis Description E11.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 severity Electronic Signature(s) Signed: 08/05/2022 3:42:18 PM By: GValeria BatmanEMT Signed: 08/05/2022 4:50:42 PM By: SWorthy KeelerPA-C Entered By: GValeria Batmanon 08/05/2022 15:42:17

## 2022-08-05 NOTE — Progress Notes (Signed)
MARKOS, THEIL A ([id number]) [id number] Page 1 of 2 Visit Report for 08/05/2022 Arrival Information Details Patient Name: Date of Service: [name], [name] 08/05/2022 8:00 A M Medical Record Number: [id number] Patient Account Number: [id number] Date of Birth/Sex: Treating RN: 1956-05-10 (67 y.o. [name] Primary Care Sharlotte Baka: [name] Other Clinician: Valeria Batman Referring Krishana Lutze: Treating Marelin Tat/Extender: [name], [name] in Treatment: 16 Visit Information History Since Last Visit All ordered tests and consults were completed: Yes Patient Arrived: Wheel Chair Added or deleted any medications: No Arrival Time: 07:38 Any new allergies or adverse reactions: No Accompanied By: None Had a fall or experienced change in No Transfer Assistance: None activities of daily living that may affect Patient Identification Verified: Yes risk of falls: Secondary Verification Process Completed: Yes Signs or symptoms of abuse/neglect since last visito No Patient Requires Transmission-Based Precautions: No Hospitalized since last visit: No Patient Has Alerts: Yes Implantable device outside of the clinic excluding No Patient Alerts: ESBL;MRSA cellular tissue based products placed in the center since last visit: Pain Present Now: No Electronic Signature(s) Signed: 08/05/2022 3:26:50 PM By: Valeria Batman EMT Entered By: Valeria Batman on 08/05/2022 15:26:50 -------------------------------------------------------------------------------- Encounter Discharge Information Details Patient Name: Date of Service: [name], [name] 08/05/2022 8:00 A M Medical Record Number: [id number] Patient Account Number: [id number] Date of Birth/Sex: Treating RN: 02/07/1956 (67 y.o. [name] Primary Care Adianna Darwin: [name] Other Clinician: Valeria Batman Referring Livia Tarr: Treating Eleisha Branscomb/Extender: [name], [name] in Treatment: [phone] Encounter Discharge Information Items Discharge Condition: Stable Ambulatory Status: Wheelchair Discharge Destination: Home Transportation: Private Auto Accompanied By: None Schedule Follow-up Appointment: Yes Clinical Summary of Care: Electronic Signature(s) Signed: 08/05/2022 3:43:16 PM By: Valeria Batman EMT Entered By: Valeria Batman on 08/05/2022 15:43:15 [name] ([id number]) [id number] Page 2 of 2 -------------------------------------------------------------------------------- Vitals Details Patient Name: Date of Service: [name], [name] 08/05/2022 8:00 A M Medical Record Number: [id number] Patient Account Number: [id number] Date of Birth/Sex: Treating RN: 03-26-1956 (67 y.o. [name] Primary Care Kristena Wilhelmi: [name] Other Clinician: Valeria Batman Referring Nivek Powley: Treating Kosisochukwu Goldberg/Extender: [name], [name] in Treatment: 16 Vital Signs Time Taken: 07:44 Capillary Blood Glucose (mg/dl): 205 Height (in): 72 Reference Range: 80 - 120 mg / dl Weight (lbs): 220 Body Mass Index (BMI): 29.8 Electronic Signature(s) Signed: 08/05/2022 3:29:30 PM By: Valeria Batman EMT Entered By: Valeria Batman on 08/05/2022 15:29:30

## 2022-08-05 NOTE — Progress Notes (Signed)
[name] [name], [name] [name] ([id number]) [id number] Page 1 of 10 Visit Report for 07/31/2022 Arrival Information Details Patient Name: Date of Service: [name] [name], [name] [name]. 07/31/2022 11:00 [name] [name]: [id number] Patient Account [name]: [id number] Date of Birth/Sex: Treating RN: 1956/04/05 (67 y.o. [name]) Primary Care Geena Weinhold: [name] Other Clinician: Referring Franci Oshana: Treating Drena Ham/Extender: [name] in Treatment: 15 Visit Information History Since Last Visit Added or deleted any medications: No Patient Arrived: Wheel Chair Any new allergies or adverse reactions: No Arrival Time: 11:29 Had [name] fall or experienced change in No Accompanied By: self activities of daily living that may affect Transfer Assistance: None risk of falls: Patient Identification Verified: Yes Signs or symptoms of abuse/neglect since last visito No Secondary Verification Process Completed: Yes Hospitalized since last visit: No Patient Requires Transmission-Based Precautions: No Implantable device outside of the clinic excluding No Patient Has Alerts: Yes cellular tissue based products placed in the center Patient Alerts: ESBL;MRSA since last visit: Has Dressing in Place as Prescribed: Yes Pain Present Now: No Electronic Signature(s) Signed: 08/03/2022 4:38:40 PM By: [name] Entered By: [name] on 07/31/2022 11:29:31 -------------------------------------------------------------------------------- Encounter Discharge Information Details Patient Name: Date of Service: [name] [name], [name] [name]. 07/31/2022 11:00 [name] [name]: [id number] Patient Account [name]: [id number] Date of Birth/Sex: Treating RN: 1956-03-19 (67 y.o. [name] Primary Care Adren Dollins: [name] Other Clinician: Referring Eternity Dexter: Treating Nadea Kirkland/Extender: [name] in Treatment: 15 Encounter Discharge  Information Items Post Procedure Vitals Discharge Condition: Stable Temperature (F): 98.4 Ambulatory Status: Wheelchair Pulse (bpm): 94 Discharge Destination: Home Respiratory Rate (breaths/min): 20 Transportation: Private Auto Blood Pressure (mmHg): 177/94 Accompanied By: self Schedule Follow-up Appointment: Yes Clinical Summary of Care: Electronic Signature(s) Signed: 07/31/2022 4:56:20 PM By: [name] RN, [name] Entered By: [name] on 07/31/2022 12:04:20 [name] ([id number]) [id number] Page 2 of 10 -------------------------------------------------------------------------------- Lower Extremity Assessment Details Patient Name: Date of Service: [name] [name], [name] [name]. 07/31/2022 11:00 [name] [name]: [id number] Patient Account [name]: [id number] Date of Birth/Sex: Treating RN: 09/22/1955 (67 y.o. [name]) Primary Care Anea Fodera: [name] Other Clinician: Referring Mccayla Shimada: Treating Adrain Butrick/Extender: [name], [name] in Treatment: 15 Edema Assessment Assessed: [Left: No] [Right: No] Edema: [Left: Yes] [Right: No] Calf Left: Right: Point of Measurement: 36 cm From Medial Instep 40.2 cm 40 cm Ankle Left: Right: Point of Measurement: 9 cm From Medial Instep 23 cm 23 cm Electronic Signature(s) Signed: 08/03/2022 4:38:40 PM By: [name] Entered By: [name] on 07/31/2022 11:40:34 -------------------------------------------------------------------------------- Multi Wound Chart Details Patient Name: Date of Service: [name] [name], [name] [name]. 07/31/2022 11:00 [name] [name]: [id number] Patient Account [name]: [id number] Date of Birth/Sex: Treating RN: 1955/08/06 (67 y.o. [name]) Primary Care Shayonna Ocampo: [name] Other Clinician: Referring Kit Brubacher: Treating Roey Coopman/Extender: [name], [name] in Treatment: 15 Vital Signs Height(in): 72 Capillary Blood Glucose(mg/dl):  186 Weight(lbs): 220 Pulse(bpm): 94 Body Mass Index(BMI): 29.8 Blood Pressure(mmHg): 177/94 Temperature(F): 98.3 Respiratory Rate(breaths/min): 18 [5:Photos:] [[name]:[name]] Left, Lateral Foot Right Amputation Site - [name] Wound Location: Transmetatarsal Surgical Injury Gradually Appeared [name] Wounding Event: Diabetic Wound/Ulcer of the Lower Diabetic Wound/Ulcer of the Lower [name] Primary Etiology: Extremity Extremity Open Surgical Wound [name] [name] Secondary Etiology: [name] [name], [name] [name] ([id number]) [phone].pdf Page 3 of 10 Asthma, Sleep Apnea, Hypertension, Asthma, Sleep Apnea, Hypertension, [name] Comorbid History: Peripheral Venous Disease, Type II Peripheral Venous Disease, Type II Diabetes, Osteomyelitis, Neuropathy Diabetes, Osteomyelitis,  Neuropathy 12/20/2021 07/16/2022 [name] Date Acquired: 15 2 [name] Weeks of Treatment: Open Open [name] Wound Status: No No [name] Wound Recurrence: 4.6x0.8x0.3 0.2x0.5x0.9 [name] Measurements L x W x D (cm) 2.89 0.079 [name] [name] (cm) : rea 0.867 0.071 [name] Volume (cm) : 71.10% 92.00% [name] % Reduction in [name] rea: 82.70% 92.80% [name] % Reduction in Volume: Grade 3 Grade 2 [name] Classification: Medium Medium [name] Exudate [name] mount: Serosanguineous Serosanguineous [name] Exudate Type: red, brown red, brown [name] Exudate Color: Epibole [name] [name] Wound Margin: Large (67-100%) Large (67-100%) [name] Granulation [name] mount: Red, Pink Red [name] Granulation Quality: Small (1-33%) None Present (0%) [name] Necrotic [name] mount: Fat Layer (Subcutaneous Tissue): Yes Fat Layer (Subcutaneous Tissue): Yes [name] Exposed Structures: Fascia: No Fascia: No Tendon: No Tendon: No Muscle: No Muscle: No Joint: No Joint: No Bone: No Bone: No Large (67-100%) None [name] Epithelialization: Debridement - Excisional Debridement - Excisional [name] Debridement: Pre-procedure Verification/Time Out 11:45 11:45 [name] Taken: Lidocaine 4% Topical Solution Lidocaine 4% Topical Solution  [name] Pain Control: Callus, Subcutaneous, Slough Callus, Subcutaneous, Slough [name] Tissue Debrided: Skin/Subcutaneous Tissue Skin/Subcutaneous Tissue [name] Level: 4.6 1 [name] Debridement [name] (sq cm): rea Curette Curette [name] Instrument: Minimum Minimum [name] Bleeding: Pressure Pressure [name] Hemostasis [name] chieved: 0 0 [name] Procedural Pain: 0 0 [name] Post Procedural Pain: Procedure was tolerated well Procedure was tolerated well [name] Debridement Treatment Response: 4.6x0.8x0.3 0.2x0.5x0.9 [name] Post Debridement Measurements L x W x D (cm) 0.867 0.071 [name] Post Debridement Volume: (cm) Callus: Yes Callus: Yes [name] Periwound Skin Texture: Excoriation: No Excoriation: No Induration: No Induration: No Crepitus: No Crepitus: No Rash: No Rash: No Scarring: No Scarring: No Maceration: Yes Maceration: No [name] Periwound Skin Moisture: Dry/Scaly: No Dry/Scaly: No Atrophie Blanche: No Atrophie Blanche: No [name] Periwound Skin Color: Cyanosis: No Cyanosis: No Ecchymosis: No Ecchymosis: No Erythema: No Erythema: No Hemosiderin Staining: No Hemosiderin Staining: No Mottled: No Mottled: No Pallor: No Pallor: No Rubor: No Rubor: No No Abnormality [name] [name] Temperature: Cellular or Tissue Based Product Debridement [name] Procedures Performed: Debridement Treatment Notes Wound #5 (Foot) Wound Laterality: Left, Lateral Cleanser Wound Cleanser Discharge Instruction: Cleanse the wound with wound cleanser prior to applying [name] clean dressing using gauze sponges, not tissue or cotton balls. Peri-Wound Care Skin Prep Discharge Instruction: Use skin prep as directed Topical Primary Dressing Grafix Secondary Dressing ABD Pad, 8x10 Discharge Instruction: Apply over primary dressing as directed. [name] [name], [name] [name] [name] ([id number]) [id number] Page 4 of 10 Secured With [employer], 4.5x3.1 (in/yd) Discharge Instruction: Secure with Kerlix as directed. 16M Medipore H  Soft Cloth Surgical T ape, 4 x 10 (in/yd) Discharge Instruction: Secure with tape as directed. Compression Wrap Compression Stockings Add-Ons Wound #6 (Amputation Site - Transmetatarsal) Wound Laterality: Right Cleanser Wound Cleanser Discharge Instruction: Cleanse the wound with wound cleanser prior to applying [name] clean dressing using gauze sponges, not tissue or cotton balls. Peri-Wound Care Topical Primary Dressing Dakin's Solution 0.25%, 16 (oz) Discharge Instruction: Moisten gauze with Dakin's solution Secondary Dressing ABD Pad, 8x10 Discharge Instruction: Apply over primary dressing as directed. Woven Gauze Sponge, Non-Sterile 4x4 in Discharge Instruction: Apply over primary dressing as directed. Secured With [employer], 4.5x3.1 (in/yd) Discharge Instruction: Secure with Kerlix as directed. Compression Wrap Compression Stockings Add-Ons Electronic Signature(s) Signed: 08/03/2022 11:13:35 AM By: [name] DO Entered By: [name] on 07/31/2022 12:06:37 -------------------------------------------------------------------------------- Multi-Disciplinary Care Plan Details Patient Name: Date of Service: [name] [name], [place] [name] [name]. 07/31/2022 11:00 [name] [name]: [id number] Patient  Account [name]: [id number] Date of Birth/Sex: Treating RN: Nov 03, 1955 (67 y.o. [name] Primary Care Airyonna Franklyn: [name] Other Clinician: Referring Muhammed Teutsch: Treating Takumi Din/Extender: [name], [name] in Treatment: 15 Active Inactive Osteomyelitis Nursing Diagnoses: Infection: osteomyelitis Goals: Diagnostic evaluation for osteomyelitis completed as ordered [name] [name], [name] [name] ([id number]) [phone].pdf Page 5 of 10 Date Initiated: 04/23/2022 Target Resolution Date: 09/04/2022 Goal Status: Active Signs and symptoms for osteomyelitis will be recognized and promptly addressed Date Initiated: 04/23/2022 Target  Resolution Date: 09/04/2022 Goal Status: Active Interventions: Assess for signs and symptoms of osteomyelitis resolution every visit Provide education on osteomyelitis Screen for HBO Treatment Activities: Biopsy : 04/16/2022 Consult for HBO : 04/23/2022 Surgical debridement : 04/23/2022 Systemic antibiotics : 04/23/2022 T ordered outside of clinic : 04/23/2022 est Notes: Wound/Skin Impairment Nursing Diagnoses: Knowledge deficit related to ulceration/compromised skin integrity Goals: Patient/caregiver will verbalize understanding of skin care regimen Date Initiated: 04/14/2022 Target Resolution Date: 09/04/2022 Goal Status: Active Interventions: Assess patient/caregiver ability to perform ulcer/skin care regimen upon admission and as needed Assess ulceration(s) every visit Provide education on ulcer and skin care Notes: Electronic Signature(s) Signed: 07/31/2022 4:56:20 PM By: [name] RN, [name] Entered By: [name] on 07/31/2022 12:02:47 -------------------------------------------------------------------------------- Pain Assessment Details Patient Name: Date of Service: [name] [name], [place] [name] [name]. 07/31/2022 11:00 [name] [name]: [id number] Patient Account [name]: [id number] Date of Birth/Sex: Treating RN: [birth year] (67 y.o. [name]) Primary Care Charlee Squibb: [name] Other Clinician: Referring Norah Devin: Treating Janita Camberos/Extender: [name], [name] in Treatment: 15 Active Problems Location of Pain Severity and Description of Pain Patient Has Paino No Site Locations [name] [name], [name] [name] ([id number]) [id number] Page 6 of 10 Pain Management and Medication Current Pain Management: Electronic Signature(s) Signed: 08/03/2022 4:38:40 PM By: [name] Entered By: [name] on 07/31/2022 11:30:13 -------------------------------------------------------------------------------- Patient/Caregiver Education  Details Patient Name: Date of Service: [name] [name], [name] Deetta Perla 1/26/2024andnbsp11:00 [name] [name]: [id number] Patient Account [name]: [id number] Date of Birth/Gender: Treating RN: 12-04-1955 (67 y.o. [name] Primary Care Physician: [name] Other Clinician: Referring Physician: Treating Physician/Extender: [name] in Treatment: 15 Education Assessment Education Provided To: Patient Education Topics Provided Wound/Skin Impairment: Handouts: Caring for Your Ulcer Methods: Explain/Verbal Responses: Reinforcements needed Electronic Signature(s) Signed: 07/31/2022 4:56:20 PM By: [name] RN, [name] Entered By: [name] on 07/31/2022 12:02:59 -------------------------------------------------------------------------------- Wound Assessment Details Patient Name: Date of Service: [name] [name], [name] [name]. 07/31/2022 11:00 [name] [name]: [id number] Patient Account [name]: [id number] Date of Birth/Sex: Treating RN: [birth year] (67 y.o. [name]) Primary Care Dontaye Hur: [name] Other Clinician: [name] ([id number]) [id number] Page 7 of 10 Referring Carlson Belland: Treating Andric Kerce/Extender: [name], [name] in Treatment: 15 Wound Status Wound [name]: 5 Primary Diabetic Wound/Ulcer of the Lower Extremity Etiology: Wound Location: Left, Lateral Foot Secondary Open Surgical Wound Wounding Event: Surgical Injury Etiology: Date Acquired: 12/20/2021 Wound Open Weeks Of Treatment: 15 Status: Clustered Wound: No Comorbid Asthma, Sleep Apnea, Hypertension, Peripheral Venous Disease, History: Type II Diabetes, Osteomyelitis, Neuropathy Photos Wound Measurements Length: (cm) 4.6 Width: (cm) 0.8 Depth: (cm) 0.3 Area: (cm) 2.89 Volume: (cm) 0.867 % Reduction in Area: 71.1% % Reduction in Volume: 82.7% Epithelialization: Large (67-100%) Tunneling: No Undermining:  No Wound Description Classification: Grade 3 Wound Margin: Epibole Exudate Amount: Medium Exudate Type: Serosanguineous Exudate Color: red, brown Foul Odor After Cleansing: No Slough/Fibrino Yes Wound Bed Granulation Amount: Large (67-100%) Exposed Structure Granulation Quality: Red, Pink Fascia Exposed:  No Necrotic Amount: Small (1-33%) Fat Layer (Subcutaneous Tissue) Exposed: Yes Necrotic Quality: Adherent Slough Tendon Exposed: No Muscle Exposed: No Joint Exposed: No Bone Exposed: No Periwound Skin Texture Texture Color No Abnormalities Noted: No No Abnormalities Noted: Yes Callus: Yes Temperature / Pain Crepitus: No Temperature: No Abnormality Excoriation: No Induration: No Rash: No Scarring: No Moisture No Abnormalities Noted: No Dry / Scaly: No Maceration: Yes Treatment Notes Wound #5 (Foot) Wound Laterality: Left, Lateral Cleanser Wound Cleanser Discharge Instruction: Cleanse the wound with wound cleanser prior to applying [name] clean dressing using gauze sponges, not tissue or cotton balls. Peri-Wound Care Skin Prep [name] [name], [name] [name] ([id number]) [id number] Page 8 of 10 Discharge Instruction: Use skin prep as directed Topical Primary Dressing Grafix Secondary Dressing ABD Pad, 8x10 Discharge Instruction: Apply over primary dressing as directed. Secured With [employer], 4.5x3.1 (in/yd) Discharge Instruction: Secure with Kerlix as directed. 59M Medipore H Soft Cloth Surgical T ape, 4 x 10 (in/yd) Discharge Instruction: Secure with tape as directed. Compression Wrap Compression Stockings Add-Ons Electronic Signature(s) Signed: 08/05/2022 8:39:24 AM By: Rhae Hammock RN Entered By: Rhae Hammock on 07/31/2022 11:37:50 -------------------------------------------------------------------------------- Wound Assessment Details Patient Name: Date of Service: [name] [name], [name] [name]. 07/31/2022 11:00 [name] [name] Medical Record  [name]: [id number] Patient Account [name]: [id number] Date of Birth/Sex: Treating RN: 25-Jan-1956 (67 y.o. [name]) Primary Care Jalyn Rosero: [name] Other Clinician: Referring Kennidee Heyne: Treating Elea Holtzclaw/Extender: [name], [name] in Treatment: 15 Wound Status Wound [name]: 6 Primary Diabetic Wound/Ulcer of the Lower Extremity Etiology: Wound Location: Right Amputation Site - Transmetatarsal Wound Open Wounding Event: Gradually Appeared Status: Date Acquired: 07/16/2022 Comorbid Asthma, Sleep Apnea, Hypertension, Peripheral Venous Disease, Weeks Of Treatment: 2 History: Type II Diabetes, Osteomyelitis, Neuropathy Clustered Wound: No Photos Wound Measurements Length: (cm) 0.2 Width: (cm) 0.5 Depth: (cm) 0.9 Area: (cm) 0.079 Volume: (cm) 0.071 % Reduction in Area: 92% % Reduction in Volume: 92.8% Epithelialization: None Tunneling: No Undermining: No Wound Description Classification: Grade 2 [name] [name], [name] [name] ([id number]) Exudate Amount: Medium Exudate Type: Serosanguineous Exudate Color: red, brown Foul Odor After Cleansing: No [id number] Page 9 of 10 Slough/Fibrino Yes Wound Bed Granulation Amount: Large (67-100%) Exposed Structure Granulation Quality: Red Fascia Exposed: No Necrotic Amount: None Present (0%) Fat Layer (Subcutaneous Tissue) Exposed: Yes Tendon Exposed: No Muscle Exposed: No Joint Exposed: No Bone Exposed: No Periwound Skin Texture Texture Color No Abnormalities Noted: No No Abnormalities Noted: No Callus: Yes Atrophie Blanche: No Crepitus: No Cyanosis: No Excoriation: No Ecchymosis: No Induration: No Erythema: No Rash: No Hemosiderin Staining: No Scarring: No Mottled: No Pallor: No Moisture Rubor: No No Abnormalities Noted: No Dry / Scaly: No Maceration: No Treatment Notes Wound #6 (Amputation Site - Transmetatarsal) Wound Laterality: Right Cleanser Wound Cleanser Discharge  Instruction: Cleanse the wound with wound cleanser prior to applying [name] clean dressing using gauze sponges, not tissue or cotton balls. Peri-Wound Care Topical Primary Dressing Dakin's Solution 0.25%, 16 (oz) Discharge Instruction: Moisten gauze with Dakin's solution Secondary Dressing ABD Pad, 8x10 Discharge Instruction: Apply over primary dressing as directed. Woven Gauze Sponge, Non-Sterile 4x4 in Discharge Instruction: Apply over primary dressing as directed. Secured With [employer], 4.5x3.1 (in/yd) Discharge Instruction: Secure with Kerlix as directed. Compression Wrap Compression Stockings Add-Ons Electronic Signature(s) Signed: 08/03/2022 4:38:40 PM By: [name] Entered By: [name] on 07/31/2022 11:38:08 -------------------------------------------------------------------------------- Vitals Details Patient Name: Date of Service: [name] [name], [place] [name] [name]. 07/31/2022 11:00 [name] [name]: [id number] Patient Account [name]: [id number]  [name] [name], [name] [name] ([id number]) [id number] Page 10 of 10 Date of Birth/Sex: Treating RN: Feb 06, 1956 (67 y.o. [name]) Primary Care Shaquon Gropp: Other Clinician: [name] Referring Sylvana Bonk: Treating Cledis Sohn/Extender: [name], [name] in Treatment: 15 Vital Signs Time Taken: 11:15 Temperature (F): 98.3 Height (in): 72 Pulse (bpm): 94 Weight (lbs): 220 Respiratory Rate (breaths/min): 18 Body Mass Index (BMI): 29.8 Blood Pressure (mmHg): 177/94 Capillary Blood Glucose (mg/dl): 186 Reference Range: 80 - 120 mg / dl Electronic Signature(s) Signed: 08/03/2022 4:38:40 PM By: [name] Entered By: [name] on 07/31/2022 11:30:03

## 2022-08-05 NOTE — Progress Notes (Addendum)
[name], CERVI [name] ([id number]) [id number] Page 1 of 2 Visit Report for 08/05/2022 HBO Details Patient Name: Date of Service: [name], [name] 08/05/2022 8:00 [name] Medical Record Number: [id number] Patient Account Number: [id number] Date of Birth/Sex: Treating RN: 1956-04-15 (67 y.o. [name] Primary Care Harryette Shuart: [name] Other Clinician: Valeria Batman Referring Kathan Kirker: Treating Tyshauna Finkbiner/Extender: [name], [name] in Treatment: 16 HBO Treatment Course Details Treatment Course Number: 1 Ordering Caitlyn Buchanan: [name] T Treatments Ordered: otal 80 HBO Treatment Start Date: 06/01/2022 HBO Indication: Diabetic Ulcer(s) of the Lower Extremity Standard/Conservative Wound Care tried and failed greater than or equal to 30 days HBO Treatment Details Treatment Number: 38 Patient Type: Outpatient Chamber Type: Monoplace Chamber Serial #: [id number] Treatment Protocol: 2.5 ATA with 90 minutes oxygen, with two 5 minute air breaks Treatment Details Compression Rate Down: 1.5 psi / minute De-Compression Rate Up: 1.5 psi / minute [name] breaks and breathing ir Compress Tx Pressure periods Decompress Decompress Begins Reached (leave unused spaces Begins Ends blank) Chamber Pressure (ATA 1 2.5 2.5 2.5 2.5 2.5 - - 2.5 1 ) Clock Time (24 hr) 08:15 08:32 09:02 09:07 09:37 09:42 - - 10:12 10:30 Treatment Length: 135 (minutes) Treatment Segments: 4 Vital Signs Capillary Blood Glucose Reference Range: 80 - 120 mg / dl HBO Diabetic Blood Glucose Intervention Range: <131 mg/dl or >249 mg/dl Type: Time Vitals Blood Pulse: Respiratory Temperature: Capillary Blood Glucose Pulse Action Taken: Pressure: Rate: Glucose (mg/dl): Meter #: Oximetry (%) Taken: Pre 07:44 205 Post 10:34 160/82 73 16 97.7 68 Patient given Glucerna. Waited for 63mn after. Pre 08:04 136/79 85 16 98.2 Post 11:00 85 informed HJeri CosPA Treatment  Response Treatment Toleration: Well Treatment Completion Status: Treatment Completed without Adverse Event Treatment Notes Post blood sugar was 68 and the patient was given 8 oz of Glucerna to drink. Blood sugar was rechecked after the patient changed was 85. Spoke with HJeri CosPA. Patient stated that he was going to get something to eat. Additional Procedure Documentation Tissue Sevierity: Necrosis of bone Electronic Signature(s) Signed: 08/05/2022 3:41:42 PM By: GValeria BatmanEMT Signed: 08/05/2022 4:50:42 PM By: SWorthy KeelerPA-C Entered By: GValeria Batmanon 08/05/2022 15:41:41 TPeri JeffersonA ([id number]) [id number] Page 2 of 2 -------------------------------------------------------------------------------- HBO Safety Checklist Details Patient Name: Date of Service: [name] 08/05/2022 8:00 [name] Medical Record Number: [id number] Account Number: [id number] of Birth/Sex: Treating RN: 102/20/1957(67y.o. MJanyth ContesPrimary Care [name]: BRoselee NovaOther Clinician: GValeria BatmanReferring Uel Davidow: Treating Trayon Krantz/Extender: SMalachy Moan [name] in Treatment: 16 HBO Safety Checklist Items Safety Checklist Consent Form Signed Patient voided / foley secured and emptied When did you last eato 0710 Last dose of injectable or oral agent 0640 Ostomy pouch emptied and vented if applicable NA All implantable devices assessed, documented and approved Tympanostomy tubes Intravenous access site secured and place NA Valuables secured Linens and cotton and cotton/polyester blend (less than 51% polyester) Personal oil-based products / skin lotions / body lotions removed Wigs or hairpieces removed NA Smoking or tobacco materials removed Books / newspapers / magazines / loose paper removed Cologne, aftershave, perfume and deodorant removed Jewelry removed (may wrap wedding band) Make-up  removed NA Hair care products removed Battery operated devices (external) removed Heating patches and chemical warmers removed Titanium eyewear removed NA Nail polish cured greater than 10 hours NA Casting material cured greater than 10 hours NA Hearing aids  removed NA Loose dentures or partials removed removed by patient Prosthetics have been removed NA Patient demonstrates correct use of air break device (if applicable) Patient concerns have been addressed Patient grounding bracelet on and cord attached to chamber Specifics for Inpatients (complete in addition to above) Medication sheet sent with patient NA Intravenous medications needed or due during therapy sent with patient NA Drainage tubes (e.g. nasogastric tube or chest tube secured and vented) NA Endotracheal or Tracheotomy tube secured NA Cuff deflated of air and inflated with saline NA Airway suctioned NA Notes The safety checklist was done before the treatment was started. Electronic Signature(s) Signed: 08/05/2022 3:35:30 PM By: Valeria Batman EMT Entered By: Valeria Batman on 08/05/2022 15:35:29

## 2022-08-06 ENCOUNTER — Encounter (HOSPITAL_BASED_OUTPATIENT_CLINIC_OR_DEPARTMENT_OTHER): Payer: Medicare HMO | Attending: Internal Medicine | Admitting: Internal Medicine

## 2022-08-06 DIAGNOSIS — E1142 Type 2 diabetes mellitus with diabetic polyneuropathy: Secondary | ICD-10-CM | POA: Diagnosis not present

## 2022-08-06 DIAGNOSIS — E11621 Type 2 diabetes mellitus with foot ulcer: Secondary | ICD-10-CM | POA: Diagnosis not present

## 2022-08-06 DIAGNOSIS — Z87891 Personal history of nicotine dependence: Secondary | ICD-10-CM | POA: Insufficient documentation

## 2022-08-06 DIAGNOSIS — Z794 Long term (current) use of insulin: Secondary | ICD-10-CM | POA: Insufficient documentation

## 2022-08-06 DIAGNOSIS — E1151 Type 2 diabetes mellitus with diabetic peripheral angiopathy without gangrene: Secondary | ICD-10-CM | POA: Insufficient documentation

## 2022-08-06 DIAGNOSIS — J45909 Unspecified asthma, uncomplicated: Secondary | ICD-10-CM | POA: Diagnosis not present

## 2022-08-06 DIAGNOSIS — L97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 severity: Secondary | ICD-10-CM | POA: Insufficient documentation

## 2022-08-06 DIAGNOSIS — L97522 Non-pressure chronic ulcer of other part of left foot with fat layer exposed: Secondary | ICD-10-CM | POA: Diagnosis present

## 2022-08-06 DIAGNOSIS — G4733 Obstructive sleep apnea (adult) (pediatric): Secondary | ICD-10-CM | POA: Insufficient documentation

## 2022-08-06 DIAGNOSIS — I10 Essential (primary) hypertension: Secondary | ICD-10-CM | POA: Diagnosis not present

## 2022-08-06 DIAGNOSIS — M86172 Other acute osteomyelitis, left ankle and foot: Secondary | ICD-10-CM | POA: Diagnosis not present

## 2022-08-06 DIAGNOSIS — L97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 specified severity: Secondary | ICD-10-CM | POA: Insufficient documentation

## 2022-08-06 LAB — GLUCOSE, CAPILLARY
Glucose-Capillary: 115 mg/dL — ABNORMAL HIGH (ref 70–99)
Glucose-Capillary: 129 mg/dL — ABNORMAL HIGH (ref 70–99)
Glucose-Capillary: 129 mg/dL — ABNORMAL HIGH (ref 70–99)

## 2022-08-06 NOTE — Progress Notes (Addendum)
BODEE, LAFOE [name] ([id number]) [id number] Page 1 of 2 Visit Report for 08/06/2022 HBO Details Patient Name: Date of Service: [name], [name]. 08/06/2022 8:00 [name] Medical Record Number: [id number] Patient Account Number: [id number] Date of Birth/Sex: Treating RN: 11-22-1955 (67 y.o. [name] Primary Care Jourden Delmont: [name] Other Clinician: Valeria Batman Referring Srikar Chiang: Treating Yeng Frankie/Extender: [name] in Treatment: 16 HBO Treatment Course Details Treatment Course Number: 1 Ordering Jermal Dismuke: [name] T Treatments Ordered: otal 80 HBO Treatment Start Date: 06/01/2022 HBO Indication: Diabetic Ulcer(s) of the Lower Extremity Standard/Conservative Wound Care tried and failed greater than or equal to 30 days HBO Treatment Details Treatment Number: 39 Patient Type: Outpatient Chamber Type: Monoplace Chamber Serial #: [id number] Treatment Protocol: 2.5 ATA with 90 minutes oxygen, with two 5 minute air breaks Treatment Details Compression Rate Down: 2.0 psi / minute De-Compression Rate Up: [name] breaks and breathing ir Compress Tx Pressure periods Decompress Decompress Begins Reached (leave unused spaces Begins Ends blank) Chamber Pressure (ATA 1 2.5 2.5 2.5 2.5 2.5 - - 2.5 1 ) Clock Time (24 hr) 08:16 08:29 08:59 09:04 09:34 09:39 - - 10:09 10:24 Treatment Length: 128 (minutes) Treatment Segments: 4 Vital Signs Capillary Blood Glucose Reference Range: 80 - 120 mg / dl HBO Diabetic Blood Glucose Intervention Range: <131 mg/dl or >249 mg/dl Type: Time Vitals Blood Pulse: Respiratory Temperature: Capillary Blood Glucose Pulse Action Taken: Pressure: Rate: Glucose (mg/dl): Meter #: Oximetry (%) Taken: Pre 07:49 115 Patient given 8 oz glucerna Pre 08:12 124/76 75 18 97.2 129 Dr. Celine [name] spoke with patient Post 10:29 154/87 77 18 97 129 Treatment Response Treatment Toleration: Well Treatment Completion  Status: Treatment Completed without Adverse Event Treatment Notes The patient blood sugar at 0749 was 115. He was given 8 oz of Glucerna to drink. Blood sugar recheck at 0812 was 129, Dr. Celine [name] spoke with patient. [name] Post blood sugar was 129. Patient stated that he was going go home and eat. Additional Procedure Documentation Tissue Sevierity: Necrosis of bone Physician HBO Attestation: I certify that I supervised this HBO treatment in accordance with Medicare guidelines. [name] trained emergency response team is readily available per Yes hospital policies and procedures. Continue HBOT as ordered. Yes Electronic Signature(s) Signed: 08/06/2022 4:06:31 PM By: [name] DO Previous Signature: 08/06/2022 11:18:52 AM Version By: Valeria Batman EMT Previous Signature: 08/06/2022 9:09:30 AM Version By: Valeria Batman EMT [name] ([id number]) [id number] Page 2 of 2 Entered By: [name] on 08/06/2022 16:04:21 -------------------------------------------------------------------------------- HBO Safety Checklist Details Patient Name: Date of Service: [name], [name]. 08/06/2022 8:00 [name] Medical Record Number: [id number] Patient Account Number: [id number] Date of Birth/Sex: Treating RN: 01/29/1956 (67 y.o. [name] Primary Care Gregorio Worley: [name] Other Clinician: Valeria Batman Referring Paola Aleshire: Treating [name]/Extender: [name] in Treatment: 16 HBO Safety Checklist Items Safety Checklist Consent Form Signed Patient voided / foley secured and emptied When did you last eato 0715 Last dose of injectable or oral agent 0630 Ostomy pouch emptied and vented if applicable NA All implantable devices assessed, documented and approved Tympanostomy tubes Intravenous access site secured and place NA Valuables secured Linens and cotton and cotton/polyester blend (less than 51% polyester) Personal  oil-based products / skin lotions / body lotions removed Wigs or hairpieces removed NA Smoking or tobacco materials removed Books / newspapers / magazines / loose paper removed Cologne, aftershave, perfume and deodorant removed Jewelry removed (may wrap wedding  band) Make-up removed NA Hair care products removed Battery operated devices (external) removed Heating patches and chemical warmers removed Titanium eyewear removed NA Nail polish cured greater than 10 hours NA Casting material cured greater than 10 hours NA Hearing aids removed NA Loose dentures or partials removed removed by patient Prosthetics have been removed NA Patient demonstrates correct use of air break device (if applicable) Patient concerns have been addressed Patient grounding bracelet on and cord attached to chamber Specifics for Inpatients (complete in addition to above) Medication sheet sent with patient NA Intravenous medications needed or due during therapy sent with patient NA Drainage tubes (e.g. nasogastric tube or chest tube secured and vented) NA Endotracheal or Tracheotomy tube secured NA Cuff deflated of air and inflated with saline NA Airway suctioned NA Notes The safety checklist was done before the treatment was started. Electronic Signature(s) Signed: 08/06/2022 9:04:16 AM By: Valeria Batman EMT Entered By: Valeria Batman on 08/06/2022 09:04:16

## 2022-08-06 NOTE — Progress Notes (Addendum)
DESHAUN, WEISINGER A ([id number]) [id number] Page 1 of 2 Visit Report for 08/06/2022 Arrival Information Details Patient Name: Date of Service: [name], [name] 08/06/2022 8:00 A M Medical Record Number: [id number] Patient Account Number: [id number] Date of Birth/Sex: Treating RN: 1955-09-02 (67 y.o. [name] Primary Care Oaklee Esther: [name] Other Clinician: Valeria Batman Referring Grae Cannata: Treating Pedrohenrique Mcconville/Extender: [name] in Treatment: 16 Visit Information History Since Last Visit All ordered tests and consults were completed: Yes Patient Arrived: Wheel Chair Added or deleted any medications: No Arrival Time: 07:46 Any new allergies or adverse reactions: No Accompanied By: None Had a fall or experienced change in No Transfer Assistance: None activities of daily living that may affect Patient Identification Verified: Yes risk of falls: Secondary Verification Process Completed: Yes Signs or symptoms of abuse/neglect since last visito No Patient Requires Transmission-Based Precautions: No Hospitalized since last visit: No Patient Has Alerts: Yes Implantable device outside of the clinic excluding No Patient Alerts: ESBL;MRSA cellular tissue based products placed in the center since last visit: Pain Present Now: No Electronic Signature(s) Signed: 08/06/2022 9:02:08 AM By: Valeria Batman EMT Entered By: Valeria Batman on 08/06/2022 09:02:08 -------------------------------------------------------------------------------- Encounter Discharge Information Details Patient Name: Date of Service: [name], [name] 08/06/2022 8:00 A M Medical Record Number: [id number] Patient Account Number: [id number] Date of Birth/Sex: Treating RN: 06-Nov-1955 (67 y.o. [name] Primary Care Laster Appling: [name] Other Clinician: Valeria Batman Referring Talasia Saulter: Treating Laila Myhre/Extender: [name] in Treatment: 16 Encounter Discharge Information Items Discharge Condition: Stable Ambulatory Status: Wheelchair Discharge Destination: Home Transportation: Private Auto Accompanied By: None Schedule Follow-up Appointment: Yes Clinical Summary of Care: Electronic Signature(s) Signed: 08/06/2022 11:21:37 AM By: Valeria Batman EMT Entered By: Valeria Batman on 08/06/2022 11:21:37 [name] ([id number]) [id number] Page 2 of 2 -------------------------------------------------------------------------------- Vitals Details Patient Name: Date of Service: [name], [name] 08/06/2022 8:00 A M Medical Record Number: [id number] Patient Account Number: [id number] Date of Birth/Sex: Treating RN: 11-Feb-1956 (67 y.o. [name] Primary Care Emya Picado: [name] Other Clinician: Valeria Batman Referring Levii Hairfield: Treating Azaylah Stailey/Extender: [name], [name] in Treatment: 16 Vital Signs Time Taken: 07:49 Capillary Blood Glucose (mg/dl): 115 Height (in): 72 Reference Range: 80 - 120 mg / dl Weight (lbs): 220 Body Mass Index (BMI): 29.8 Electronic Signature(s) Signed: 08/06/2022 9:02:30 AM By: Valeria Batman EMT Entered By: Valeria Batman on 08/06/2022 09:02:30

## 2022-08-06 NOTE — Progress Notes (Signed)
[name], [name] ([id number]) [id number] Page 1 of 2 Visit Report for 08/06/2022 Problem List Details Patient Name: Date of Service: [name], [name] 08/06/2022 8:00 A M Medical Record Number: [id number] Patient Account Number: [id number] Date of Birth/Sex: Treating RN: Sep 21, 1955 (67 y.o. [name] Primary Care Provider: [name] Other Clinician: Valeria Batman Referring Provider: Treating Provider/Extender: [name] in Treatment: 16 Active Problems ICD-10 Encounter Code Description Active Date MDM Diagnosis [phone] Non-pressure chronic ulcer of other part of left foot with fat layer 04/14/2022 No Yes exposed M86.172 Other acute osteomyelitis, left ankle and foot 05/08/2022 No Yes L97.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 04/14/2022 No Yes specified severity E11.621 Type 2 diabetes mellitus with foot ulcer 04/14/2022 No Yes E11.42 Type 2 diabetes mellitus with diabetic polyneuropathy 04/14/2022 No Yes S91.301A Unspecified open wound, right foot, initial encounter 07/16/2022 No Yes 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 08/06/2022 Medical Record Number: [id number] Patient Account Number: [id number] Date of Birth/Sex: Treating RN: [birth year] (67 y.o. 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.42 Type 2 diabetes mellitus with diabetic polyneuropathy S91.301A Unspecified open wound, right foot, initial encounter 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 severity Facility Procedures CPT4 Code Description Modifier Quantity [id number]-(Facility Use Only) HBOT full body chamber, 49mn , 4 ICD-10 Diagnosis Description E11.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 M86.172 Other acute osteomyelitis, left ankle and foot Physician Procedures Quantity CPT4 Code Description Modifier [id number] - WC PHYS HYPERBARIC OXYGEN THERAPY 1 ICD-10 Diagnosis Description E11.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

## 2022-08-07 ENCOUNTER — Encounter (HOSPITAL_BASED_OUTPATIENT_CLINIC_OR_DEPARTMENT_OTHER): Payer: Medicare HMO | Admitting: Internal Medicine

## 2022-08-07 DIAGNOSIS — E11621 Type 2 diabetes mellitus with foot ulcer: Secondary | ICD-10-CM | POA: Diagnosis not present

## 2022-08-07 DIAGNOSIS — L97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 specified severity: Secondary | ICD-10-CM | POA: Diagnosis not present

## 2022-08-07 DIAGNOSIS — L97522 Non-pressure chronic ulcer of other part of left foot with fat layer exposed: Secondary | ICD-10-CM

## 2022-08-07 DIAGNOSIS — L97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 severity: Secondary | ICD-10-CM

## 2022-08-07 DIAGNOSIS — M86172 Other acute osteomyelitis, left ankle and foot: Secondary | ICD-10-CM

## 2022-08-07 LAB — GLUCOSE, CAPILLARY
Glucose-Capillary: 85 mg/dL (ref 70–99)
Glucose-Capillary: 92 mg/dL (ref 70–99)
Glucose-Capillary: 101 mg/dL — ABNORMAL HIGH (ref 70–99)
Glucose-Capillary: 103 mg/dL — ABNORMAL HIGH (ref 70–99)
Glucose-Capillary: 112 mg/dL — ABNORMAL HIGH (ref 70–99)

## 2022-08-07 NOTE — Progress Notes (Addendum)
[name] ([id number]) [id number] Page 1 of 3 Visit Report for 08/07/2022 HBO Details Patient Name: Date of Service: [name], [name] 08/07/2022 8:00 [name] Medical Record Number: [id number] Patient Account Number: [id number] Date of Birth/Sex: Treating RN: 1957-12-[name] (67 y.o. [name], [name] Primary Care [name]: [name] Other Clinician: [name] Referring [name]: Treating [name] HBO Treatment Course Details Treatment Course Number: 1 Ordering [name]: [name] T Treatments Ordered: otal 80 HBO Treatment Start Date: 06/01/2022 HBO Indication: Diabetic Ulcer(s) of the Lower Extremity Standard/Conservative Wound Care tried and failed greater than or equal to 30 days HBO Treatment Details Treatment Number: 40 Patient Type: Outpatient Chamber Type: Monoplace Chamber Serial #: [id number] Treatment Protocol: 2.5 ATA with 90 minutes oxygen, with two 5 minute air breaks Treatment Details Compression Rate Down: 1.5 psi / minute De-Compression Rate Up: 1.5 psi / minute [name] breaks and breathing ir Compress Tx Pressure periods Decompress Decompress Begins Reached (leave unused spaces Begins Ends blank) Chamber Pressure (ATA 1 2.5 2.5 2.5 2.5 2.5 - - 2.5 1 ) Clock Time (24 hr) 10:36 10:49 11:20 11:25 11:55 13:00 - - 13:30 12:49 Treatment Length: 133 (minutes) Treatment Segments: 4 Vital Signs Capillary Blood Glucose Reference Range: 80 - 120 mg / dl HBO Diabetic Blood Glucose Intervention Range: <131 mg/dl or >249 mg/dl Type: Time Vitals Blood Pulse: Respiratory Temperature: Capillary Blood Glucose Pulse Action Taken: Pressure: Rate: Glucose (mg/dl): Meter #: Oximetry (%) Taken: Pre 07:48 142/84 71 [name] 97.7 92 1 8 oz Glucerna given, wait 30 minutes, retest. Pre 08:23 85 1 Patient went to wound care appointment Pre 09:35 103 1 Given [name] snack/meal Pre 10:02 101 1 Given  8 oz Glucerna Pre 10:25 143/85 66 18 97.2 112 1 Cleared for HBOT Post 12:51 138/78 89 18 98.1 62 1 Given orange juice and peanut butter Treatment Response Treatment Completion Status: Treatment Completed without Adverse Event Treatment Notes Patient arrived for hyperbaric treatment and blood glucose was measured resulting in 92 mg/dL (0258). Patient was given 8 oz Glucerna (finished 5277). Per protocol, 30 minutes later blood glucose measured again resulting in 85 mg/dL (8242). Patient states that he ate egg and cheese sandwich and orange juice about 0700. Physician was informed. Hyperbaric treatment delayed and patient went to wound care appointment. Due to schedule changes [name] hyperbaric chamber was available and Mr. [name]'s blood glucose, per [name], was measured again in preparation for treatment. Result was 103 mg/dL (0935). Patient was given [name] snack of peanut butter and orange juice. Blood glucose was measured again at 1002 and had decreased to 101 mg/dL. Patient was given 8 oz Glucerna (1009) and orders to check blood glucose once more at 15 minutes. Blood Glucose level at 1025 was on [name] now upward trend at 112 mg/dL. Dr. Heber [name] cleared patient for treatment. (MSCAMMELL 08/07/22 11:11) Patient tolerated treatment well. Chamber decompressed at 1.5 psi/min. Patient's post-treatment glucose level was 62 mg/dL. Patient was given [name] snack with orange juice and peanut butter by physician order. Blood glucose was re-measured at 1313 and was 85 mg/dL. Patient denied any issues with ear equalization and/or pain. Patient was stable upon discharge. Additional Procedure Documentation Tissue Sevierity: Necrosis of bone Priest Lockridge Notes Patient's blood glucose levels were low at 101. Patient was given orange juice and peanut butter and rechecked at 112. Patient completed HBO therapy without [name] ([id number]) [id number] Page 2 of 3 issues. When out of the  chamber  his glucose levels were 62. He had no complaints. He was again given peanut butter and orange juice and blood sugars went up to 85. Overall patient tolerated treatment well. Physician HBO Attestation: I certify that I supervised this HBO treatment in accordance with Medicare guidelines. [name] trained emergency response team is readily available per Yes hospital policies and procedures. Continue HBOT as ordered. Yes Electronic Signature(s) Signed: 08/10/2022 9:12:52 AM By: [name] Previous Signature: 08/07/2022 3:09:42 PM Version By: [name] CHT EMT BS , , Previous Signature: 08/10/2022 9:08:23 AM Version By: [name] Previous Signature: 08/07/2022 11:12:06 AM Version By: [name] CHT EMT BS , , Previous Signature: 08/07/2022 11:56:15 AM Version By: [name] Entered By: [name] on 08/10/2022 09:12:20 -------------------------------------------------------------------------------- HBO Safety Checklist Details Patient Name: Date of Service: [name], [name] 08/07/2022 8:00 [name] Medical Record Number: [id number] Patient Account Number: [id number] Date of Birth/Sex: Treating RN: 10-06-1955 (67 y.o. [name], [name] Primary Care [name]: [name] Other Clinician: [name] Referring [name]: Treating [name] in Treatment: [name] HBO Safety Checklist Items Safety Checklist Consent Form Signed Patient voided / foley secured and emptied When did you last eato 0700 Last dose of injectable or oral agent 0645 Ostomy pouch emptied and vented if applicable NA All implantable devices assessed, documented and approved NA Intravenous access site secured and place NA Valuables secured Linens and cotton and cotton/polyester blend (less than 51% polyester) Personal oil-based products / skin lotions / body lotions removed Wigs or hairpieces removed NA Smoking or tobacco materials  removed NA Books / newspapers / magazines / loose paper removed Cologne, aftershave, perfume and deodorant removed Jewelry removed (may wrap wedding band) Make-up removed NA Hair care products removed Battery operated devices (external) removed Heating patches and chemical warmers removed Titanium eyewear removed Nail polish cured greater than 10 hours NA Casting material cured greater than 10 hours NA Hearing aids removed Loose dentures or partials removed dentures removed Prosthetics have been removed NA Patient demonstrates correct use of air break device (if applicable) Patient concerns have been addressed Patient grounding bracelet on and cord attached to chamber Specifics for Inpatients (complete in addition to above) Medication sheet sent with patient NA Intravenous medications needed or due during therapy sent with patient NA Drainage tubes (e.g. nasogastric tube or chest tube secured and vented) NA Endotracheal or Tracheotomy tube secured NA Cuff deflated of air and inflated with saline NA Airway suctioned NA MANCE, VALLEJO [name] ([id number]) [id number] Page 3 of 3 Notes Paper version used prior to treatment. Electronic Signature(s) Signed: 08/07/2022 10:57:11 AM By: [name] CHT EMT BS , , Entered By: [name] on 08/07/2022 10:57:10

## 2022-08-07 NOTE — Progress Notes (Addendum)
MINORU, CHAP [name] ([id number]) [id number] Page 1 of 2 Visit Report for 08/07/2022 Arrival Information Details Patient Name: Date of Service: [name], [name] 08/07/2022 8:00 [name] Medical Record Number: [id number] Patient Account Number: [id number] Date of Birth/Sex: Treating RN: October 13, 1955 (67 y.o. [name], [name] Primary Care Jac Romulus: [name] Other Clinician: [name] Referring Graceyn Fodor: Treating Raeya Merritts/Extender: [name] in Treatment: 16 Visit Information History Since Last Visit All ordered tests and consults were completed: Yes Patient Arrived: Cane Added or deleted any medications: No Arrival Time: 07:30 Any new allergies or adverse reactions: No Accompanied By: self Had [name] fall or experienced change in No Transfer Assistance: None activities of daily living that may affect Patient Identification Verified: Yes risk of falls: Secondary Verification Process Completed: Yes Signs or symptoms of abuse/neglect since last visito No Patient Requires Transmission-Based Precautions: No Hospitalized since last visit: No Patient Has Alerts: Yes Implantable device outside of the clinic excluding No Patient Alerts: ESBL;MRSA cellular tissue based products placed in the center since last visit: Pain Present Now: No Electronic Signature(s) Signed: 08/07/2022 10:55:22 AM By: [name] CHT EMT BS , , Entered By: [name] on 08/07/2022 10:55:22 -------------------------------------------------------------------------------- Encounter Discharge Information Details Patient Name: Date of Service: [name], [name] 08/07/2022 8:00 [name] Medical Record Number: [id number] Patient Account Number: [id number] Date of Birth/Sex: Treating RN: 1956/01/07 (67 y.o. [name] Primary Care Isidore Margraf: [name] Other Clinician: [name] Referring Ople Girgis: Treating Arnez Stoneking/Extender: [name] in Treatment: 16 Encounter Discharge Information Items Discharge Condition: Stable Ambulatory Status: Wheelchair Discharge Destination: Home Transportation: Private Auto Accompanied By: self Schedule Follow-up Appointment: No Clinical Summary of Care: Electronic Signature(s) Signed: 08/07/2022 3:11:40 PM By: [name] CHT EMT BS , , Entered By: [name] on 08/07/2022 15:11:40 [name] ([id number]) [phone].pdf Page 2 of 2 -------------------------------------------------------------------------------- Vitals Details Patient Name: Date of Service: [name], [name] 08/07/2022 8:00 [name] Medical Record Number: [id number] Patient Account Number: [id number] Date of Birth/Sex: Treating RN: 1955-09-12 (67 y.o. [name], [name] Primary Care Eliyanah Elgersma: [name] Other Clinician: [name] Referring Terek Bee: Treating Jacari Iannello/Extender: [name] in Treatment: 16 Vital Signs Time Taken: 07:48 Temperature (F): 97.7 Height (in): 72 Pulse (bpm): 71 Weight (lbs): 220 Respiratory Rate (breaths/min): 16 Body Mass Index (BMI): 29.8 Blood Pressure (mmHg): 142/84 Capillary Blood Glucose (mg/dl): 92 Reference Range: 80 - 120 mg / dl Electronic Signature(s) Signed: 08/07/2022 10:55:54 AM By: [name] CHT EMT BS , , Entered By: [name] on 08/07/2022 10:55:53

## 2022-08-10 ENCOUNTER — Encounter (HOSPITAL_BASED_OUTPATIENT_CLINIC_OR_DEPARTMENT_OTHER): Payer: Medicare HMO | Admitting: Internal Medicine

## 2022-08-10 DIAGNOSIS — L97522 Non-pressure chronic ulcer of other part of left foot with fat layer exposed: Secondary | ICD-10-CM | POA: Diagnosis not present

## 2022-08-10 DIAGNOSIS — L97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 specified severity: Secondary | ICD-10-CM | POA: Diagnosis not present

## 2022-08-10 DIAGNOSIS — E11621 Type 2 diabetes mellitus with foot ulcer: Secondary | ICD-10-CM

## 2022-08-10 DIAGNOSIS — M86172 Other acute osteomyelitis, left ankle and foot: Secondary | ICD-10-CM | POA: Diagnosis not present

## 2022-08-10 LAB — GLUCOSE, CAPILLARY
Glucose-Capillary: 62 mg/dL — ABNORMAL LOW (ref 70–99)
Glucose-Capillary: 85 mg/dL (ref 70–99)
Glucose-Capillary: 128 mg/dL — ABNORMAL HIGH (ref 70–99)
Glucose-Capillary: 137 mg/dL — ABNORMAL HIGH (ref 70–99)
Glucose-Capillary: 115 mg/dL — ABNORMAL HIGH (ref 70–99)
Glucose-Capillary: 133 mg/dL — ABNORMAL HIGH (ref 70–99)
Glucose-Capillary: 95 mg/dL (ref 70–99)
Glucose-Capillary: 97 mg/dL (ref 70–99)

## 2022-08-10 NOTE — Progress Notes (Signed)
JANTHONY, HOLLEMAN [name] ([id number]) [id number] Page 1 of 2 Visit Report for 08/10/2022 Arrival Information Details Patient Name: Date of Service: [name], [name]. 08/10/2022 8:00 [name] Medical Record Number: [id number] Patient Account Number: [id number] Date of Birth/Sex: Treating RN: 05/24/1956 (67 y.o. [name] Primary Care Cambryn Charters: [name] Other Clinician: [name] Referring Damier Disano: Treating Tekla Malachowski/Extender: [name] in Treatment: 16 Visit Information History Since Last Visit All ordered tests and consults were completed: Yes Patient Arrived: Wheel Chair Added or deleted any medications: No Arrival Time: 07:33 Any new allergies or adverse reactions: No Accompanied By: self Had [name] fall or experienced change in No Transfer Assistance: None activities of daily living that may affect Patient Identification Verified: Yes risk of falls: Secondary Verification Process Completed: Yes Signs or symptoms of abuse/neglect since last visito No Patient Requires Transmission-Based Precautions: No Hospitalized since last visit: No Patient Has Alerts: Yes Implantable device outside of the clinic excluding No Patient Alerts: ESBL;MRSA cellular tissue based products placed in the center since last visit: Pain Present Now: No Electronic Signature(s) Signed: 08/10/2022 1:43:11 PM By: [name] CHT EMT BS , , Entered By: [name] on 08/10/2022 13:43:10 -------------------------------------------------------------------------------- Encounter Discharge Information Details Patient Name: Date of Service: [name], [name]. 08/10/2022 8:00 [name] Medical Record Number: [id number] Patient Account Number: [id number] Date of Birth/Sex: Treating RN: 06-27-1956 (67 y.o. [name] Primary Care Annjanette Wertenberger: [name] Other Clinician: [name] Referring Kriti Katayama: Treating Naevia Unterreiner/Extender:  [name] in Treatment: 16 Encounter Discharge Information Items Discharge Condition: Stable Ambulatory Status: Wheelchair Discharge Destination: Home Transportation: Private Auto Accompanied By: self Schedule Follow-up Appointment: No Clinical Summary of Care: Electronic Signature(s) Signed: 08/10/2022 2:07:53 PM By: [name] CHT EMT BS , , Entered By: [name] on 08/10/2022 14:07:52 [name] ([id number]) [id number] Page 2 of 2 -------------------------------------------------------------------------------- Vitals Details Patient Name: Date of Service: [name], [name]. 08/10/2022 8:00 [name] Medical Record Number: [id number] Patient Account Number: [id number] Date of Birth/Sex: Treating RN: [birth year] (67 y.o. [name] Primary Care Maddoxx Burkitt: [name] Other Clinician: [name] Referring Zackery Brine: Treating Royale Lennartz/Extender: [name] in Treatment: 16 Vital Signs Time Taken: 07:46 Temperature (F): 98.2 Height (in): 72 Pulse (bpm): 79 Weight (lbs): 220 Respiratory Rate (breaths/min): 18 Body Mass Index (BMI): 29.8 Blood Pressure (mmHg): 129/98 Capillary Blood Glucose (mg/dl): 137 Reference Range: 80 - 120 mg / dl Electronic Signature(s) Signed: 08/10/2022 1:43:45 PM By: [name] CHT EMT BS , , Entered By: [name] on 08/10/2022 13:43:45

## 2022-08-10 NOTE — Progress Notes (Signed)
VIGGO, PERKO A ([id number]) [id number] Page 1 of 1 Visit Report for 08/07/2022 SuperBill Details Patient Name: Date of Service: [name], [name] 08/07/2022 Medical Record Number: [id number] Patient Account Number: [id number] Date of Birth/Sex: Treating RN: 03/16/1956 (67 y.o. [name], [name] Primary Care Provider: [name] Other Clinician: [name] Referring Provider: Treating Provider/Extender: [name] in Treatment: 16 Diagnosis Coding ICD-10 Codes Code Description [phone] Non-pressure chronic ulcer of other part of left foot with fat layer exposed M86.172 Other acute osteomyelitis, left ankle and foot L97.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 specified severity E11.621 Type 2 diabetes mellitus with foot ulcer E11.42 Type 2 diabetes mellitus with diabetic polyneuropathy S91.301A Unspecified open wound, right foot, initial encounter 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 severity Facility Procedures CPT4 Code Description Modifier Quantity [id number] G0277-(Facility Use Only) HBOT full body chamber, 68mn , 4 ICD-10 Diagnosis Description E11.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 M86.172 Other acute osteomyelitis, left ankle and foot Physician Procedures Quantity CPT4 Code Description Modifier [id number] - WC PHYS HYPERBARIC OXYGEN THERAPY 1 ICD-10 Diagnosis Description E11.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

## 2022-08-10 NOTE — Progress Notes (Signed)
[name], SCHRIVER [name] ([id number]) [id number] Page 1 of 3 Visit Report for 08/10/2022 HBO Details Patient Name: Date of Service: [name], [name]. 08/10/2022 8:00 [name] Medical Record Number: [id number] Patient Account Number: [id number] Date of Birth/Sex: Treating RN: April 03, 1956 (67 y.o. [name] Primary Care [name]: [name] Other Clinician: [name] Referring [name]: Treating [name]/Extender: [name] in Treatment: 16 HBO Treatment Course Details Treatment Course Number: 1 Ordering [name]: [name] T Treatments Ordered: otal 80 HBO Treatment Start Date: 06/01/2022 HBO Indication: Diabetic Ulcer(s) of the Lower Extremity Standard/Conservative Wound Care tried and failed greater than or equal to 30 days HBO Treatment Details Treatment Number: 41 Patient Type: Outpatient Chamber Type: Monoplace Chamber Serial #: [id number] Treatment Protocol: 2.5 ATA with 90 minutes oxygen, with two 5 minute air breaks Treatment Details Compression Rate Down: 1.5 psi / minute De-Compression Rate Up: 2.0 psi / minute [name] breaks and breathing ir Compress Tx Pressure periods Decompress Decompress Begins Reached (leave unused spaces Begins Ends blank) Chamber Pressure (ATA 1 2.5 2.5 2.5 2.5 2.5 - - 2.5 1 ) Clock Time (24 hr) 09:10 09:25 09:55 10:00 10:30 10:35 - - 11:05 11:16 Treatment Length: 126 (minutes) Treatment Segments: 4 Vital Signs Capillary Blood Glucose Reference Range: 80 - 120 mg / dl HBO Diabetic Blood Glucose Intervention Range: <131 mg/dl or >249 mg/dl Time Blood Respiratory Capillary Blood Glucose Pulse Action Type: Vitals Pressure: Pulse: Temperature: Glucose (mg/dl): Meter #: Oximetry (%) Rate: Taken: Taken: Pre 07:46 129/98 79 18 98.2 137 1 CBG re-measured for safety due to issues prior Tx Post 11:24 137/75 63 18 97.3 95 1 Given 8 oz Glucerna Pre 08:09 128 1 Given 8 oz Glucerna Pre 08:30  115 Per Shacarra Choe, two orange juice and 2 peanut butter Pre 09:03 133 Patient cleared for Tx Post 12:06 97 1 [name] informed Treatment Response Treatment Toleration: Well Treatment Completion Status: Treatment Completed without Adverse Event Treatment Notes Patient arrived for treatment, blood glucose level was measured at 137 mg/dL. Patient stated he ate Pizza and drank lemon juice and that self-measured CBG was 180 this morning. Patient prepared for treatment. As [name] precaution blood glucose was measured again and it had dropped to 128 mg/dL. Based on encounter Friday, February 2, patient was given 8 oz Glucerna per protocol. Spoke to Dr. Heber [name] via phone and 2 orange juice and peanut butter and crackers were ordered. I measured blood glucose again after 20 minutes, resulting with 115 mg/dL. Patient was given 2 orange juice, two peanut butter cups, and two graham crackers. Blood glucose level was remeasured at 0903 with [name] result of 133 mg/dL. Patient tolerated compression of chamber at 1 psi/min until reaching 3 psig at which time patient confirmed proper ear equalization and rate set was increased to 2 psi/min until treatment pressure was reached (2.5 ATA). Patient tolerated treatment and decompression of the chamber at 2 psi/min. Post treatment glucose level was 95 mg/dL. Patient was given 8 oz Glucerna. At 1206 blood glucose was 97 mg/dL. Physician was informed of blood glucose. Patient stated that he felt fine and was asymptomatic for hypoglycemia. Patient was stable upon discharge. Additional Procedure Documentation Tissue Sevierity: Necrosis of bone Physician HBO Attestation: I certify that I supervised this HBO treatment in accordance with Medicare [name], [name] [name] ([id number]) [id number].pdf Page 2 of 3 guidelines. [name] trained emergency response team is readily available per Yes hospital policies and procedures. Continue HBOT as ordered. Yes Electronic  Signature(s) Signed: 08/11/2022  9:21:56 AM By: [name] Shan DO Previous Signature: 08/10/2022 2:06:41 PM Version By: [name] CHT EMT BS , , Previous Signature: 08/10/2022 2:06:07 PM Version By: [name] CHT EMT BS , , Entered By: [name] on 08/11/2022 09:05:32 -------------------------------------------------------------------------------- HBO Safety Checklist Details Patient Name: Date of Service: [name], [name] 08/10/2022 8:00 [name] Medical Record Number: [id number] Patient Account Number: [id number] Date of Birth/Sex: Treating RN: 1956/03/14 (67 y.o. [name] Primary Care [name]: [name] Other Clinician: [name] Referring Tyneshia Stivers: Treating Deantre Bourdon/Extender: [name] in Treatment: 16 HBO Safety Checklist Items Safety Checklist Consent Form Signed Patient voided / foley secured and emptied When did you last eato 0630 Last dose of injectable or oral agent 0615 Ostomy pouch emptied and vented if applicable NA All implantable devices assessed, documented and approved NA Intravenous access site secured and place NA Valuables secured Linens and cotton and cotton/polyester blend (less than 51% polyester) Personal oil-based products / skin lotions / body lotions removed Wigs or hairpieces removed NA Smoking or tobacco materials removed NA Books / newspapers / magazines / loose paper removed Cologne, aftershave, perfume and deodorant removed Jewelry removed (may wrap wedding band) Make-up removed NA Hair care products removed NA Battery operated devices (external) removed Heating patches and chemical warmers removed Titanium eyewear removed Nail polish cured greater than 10 hours Casting material cured greater than 10 hours NA Hearing aids removed NA Loose dentures or partials removed dentures removed Prosthetics have been removed NA Patient demonstrates correct use of air break  device (if applicable) Patient concerns have been addressed Patient grounding bracelet on and cord attached to chamber Specifics for Inpatients (complete in addition to above) Medication sheet sent with patient NA Intravenous medications needed or due during therapy sent with patient NA Drainage tubes (e.g. nasogastric tube or chest tube secured and vented) NA Endotracheal or Tracheotomy tube secured NA Cuff deflated of air and inflated with saline NA Airway suctioned NA Notes Paper version used prior to treatment. Electronic Signature(s) ELLSWORTH, WALDSCHMIDT [name] ([id number]) [id number] Page 3 of 3 Signed: 08/10/2022 1:47:48 PM By: [name] CHT EMT BS , , Previous Signature: 08/10/2022 1:47:22 PM Version By: [name] CHT EMT BS , , Entered By: [name] on 08/10/2022 13:47:48

## 2022-08-11 ENCOUNTER — Encounter (HOSPITAL_BASED_OUTPATIENT_CLINIC_OR_DEPARTMENT_OTHER): Payer: Medicare HMO | Admitting: Internal Medicine

## 2022-08-11 DIAGNOSIS — M86172 Other acute osteomyelitis, left ankle and foot: Secondary | ICD-10-CM | POA: Diagnosis not present

## 2022-08-11 DIAGNOSIS — E11621 Type 2 diabetes mellitus with foot ulcer: Secondary | ICD-10-CM

## 2022-08-11 DIAGNOSIS — L97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 specified severity: Secondary | ICD-10-CM

## 2022-08-11 DIAGNOSIS — L97522 Non-pressure chronic ulcer of other part of left foot with fat layer exposed: Secondary | ICD-10-CM

## 2022-08-11 LAB — GLUCOSE, CAPILLARY
Glucose-Capillary: 237 mg/dL — ABNORMAL HIGH (ref 70–99)
Glucose-Capillary: 190 mg/dL — ABNORMAL HIGH (ref 70–99)

## 2022-08-11 NOTE — Progress Notes (Signed)
WOODY, KRONBERG A ([id number]) [id number] Page 1 of 1 Visit Report for 08/10/2022 SuperBill Details Patient Name: Date of Service: [name], [name] 08/10/2022 Medical Record Number: [id number] Patient Account Number: [id number] Date of Birth/Sex: Treating RN: May 09, 1956 (67 y.o. [name] Primary Care Provider: [name] Other Clinician: [name] Referring Provider: Treating Provider/Extender: [name] in Treatment: 16 Diagnosis Coding ICD-10 Codes Code Description [phone] Non-pressure chronic ulcer of other part of left foot with fat layer exposed M86.172 Other acute osteomyelitis, left ankle and foot L97.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 specified severity E11.621 Type 2 diabetes mellitus with foot ulcer E11.42 Type 2 diabetes mellitus with diabetic polyneuropathy S91.301A Unspecified open wound, right foot, initial encounter 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 severity Facility Procedures CPT4 Code Description Modifier Quantity [id number] G0277-(Facility Use Only) HBOT full body chamber, 72mn , 4 ICD-10 Diagnosis Description E11.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 M86.172 Other acute osteomyelitis, left ankle and foot Physician Procedures Quantity CPT4 Code Description Modifier [id number] - WC PHYS HYPERBARIC OXYGEN THERAPY 1 ICD-10 Diagnosis Description E11.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

## 2022-08-11 NOTE — Progress Notes (Signed)
SCHNEUR, CROWSON A ([id number]) [id number] Page 1 of 2 Visit Report for 08/11/2022 Arrival Information Details Patient Name: Date of Service: [name], [name] 08/11/2022 8:00 A M Medical Record Number: [id number] Patient Account Number: [id number] Date of Birth/Sex: Treating RN: 1956/04/28 (67 y.o. [name] Primary Care [name]: [name] Other Clinician: [name] Referring [name]: Treating [name]/Extender: [name] in Treatment: 3 Visit Information History Since Last Visit All ordered tests and consults were completed: Yes Patient Arrived: Wheel Chair Added or deleted any medications: No Arrival Time: 07:30 Any new allergies or adverse reactions: No Accompanied By: None Had a fall or experienced change in No Transfer Assistance: None activities of daily living that may affect Patient Identification Verified: Yes risk of falls: Secondary Verification Process Completed: Yes Signs or symptoms of abuse/neglect since last visito No Patient Requires Transmission-Based Precautions: No Hospitalized since last visit: No Patient Has Alerts: Yes Implantable device outside of the clinic excluding No Patient Alerts: ESBL;MRSA cellular tissue based products placed in the center since last visit: Pain Present Now: No Electronic Signature(s) Signed: 08/11/2022 1:17:41 PM By: [name] EMT Entered By: [name] on 08/11/2022 13:17:40 -------------------------------------------------------------------------------- Encounter Discharge Information Details Patient Name: Date of Service: [name], [name] 08/11/2022 8:00 A M Medical Record Number: [id number] Patient Account Number: [id number] Date of Birth/Sex: Treating RN: 12/09/1955 (67 y.o. [name] Primary Care [name]: [name] Other Clinician: [name] Referring [name]: Treating [name]/Extender: [name] in Treatment: 17 Encounter Discharge Information Items Discharge Condition: Stable Ambulatory Status: Wheelchair Discharge Destination: Home Transportation: Private Auto Accompanied By: None Schedule Follow-up Appointment: Yes Clinical Summary of Care: Electronic Signature(s) Signed: 08/11/2022 1:27:03 PM By: [name] EMT Entered By: [name] on 08/11/2022 13:27:02 [name] ([id number]) [id number] Page 2 of 2 -------------------------------------------------------------------------------- Vitals Details Patient Name: Date of Service: [name], [name] 08/11/2022 8:00 A M Medical Record Number: [id number] Patient Account Number: [id number] Date of Birth/Sex: Treating RN: 1955-11-26 (67 y.o. [name] Primary Care [name]: [name] Other Clinician: [name] Referring [name]: Treating [name]/Extender: [name] in Treatment: 17 Vital Signs Time Taken: 07:49 Capillary Blood Glucose (mg/dl): 237 Height (in): 72 Reference Range: 80 - 120 mg / dl Weight (lbs): 220 Body Mass Index (BMI): 29.8 Electronic Signature(s) Signed: 08/11/2022 1:19:56 PM By: [name] EMT Entered By: [name] on 08/11/2022 13:19:56

## 2022-08-11 NOTE — Progress Notes (Addendum)
TAMOTSU, WIEDERHOLT A ([id number]) [id number] Page 1 of 2 Visit Report for 08/11/2022 HBO Details Patient Name: Date of Service: [name], [name] 08/11/2022 8:00 A M Medical Record Number: [id number] Patient Account Number: [id number] Date of Birth/Sex: Treating RN: 09-13-1955 (67 y.o. [name] Primary Care Annibelle Brazie: [name] Other Clinician: Valeria Batman Referring Reginold Beale: Treating Caydee Talkington/Extender: [name] in Treatment: 17 HBO Treatment Course Details Treatment Course Number: 1 Ordering Demecia Northway: [name] T Treatments Ordered: otal 80 HBO Treatment Start Date: 06/01/2022 HBO Indication: Diabetic Ulcer(s) of the Lower Extremity Standard/Conservative Wound Care tried and failed greater than or equal to 30 days HBO Treatment Details Treatment Number: 42 Patient Type: Outpatient Chamber Type: Monoplace Chamber Serial #: [id number] Treatment Protocol: 2.5 ATA with 90 minutes oxygen, with two 5 minute air breaks Treatment Details Compression Rate Down: 2.0 psi / minute De-Compression Rate Up: 2.0 psi / minute A breaks and breathing ir Compress Tx Pressure periods Decompress Decompress Begins Reached (leave unused spaces Begins Ends blank) Chamber Pressure (ATA 1 2.5 2.5 2.5 2.5 2.5 - - 2.5 1 ) Clock Time (24 hr) 08:18 08:30 09:00 09:05 09:35 09:35 - - 10:11 10:19 Treatment Length: 121 (minutes) Treatment Segments: 4 Vital Signs Capillary Blood Glucose Reference Range: 80 - 120 mg / dl HBO Diabetic Blood Glucose Intervention Range: <131 mg/dl or >249 mg/dl Time Vitals Blood Respiratory Capillary Blood Glucose Pulse Action Type: Pulse: Temperature: Taken: Pressure: Rate: Glucose (mg/dl): Meter #: Oximetry (%) Taken: Pre 07:49 237 Post 10:29 136/75 72 18 98.1 190 Pre 08:08 142/83 98 18 98.2 Treatment Response Treatment Toleration: Well Treatment Completion Status: Treatment Completed without  Adverse Event Treatment Notes The patient stated that he had [place], egg and cheese muffin with orange juice. The patient used Afrin before treatment today. Additional Procedure Documentation Tissue Sevierity: Necrosis of bone Physician HBO Attestation: I certify that I supervised this HBO treatment in accordance with Medicare guidelines. A trained emergency response team is readily available per Yes hospital policies and procedures. Continue HBOT as ordered. Yes Electronic Signature(s) Signed: 08/11/2022 3:36:48 PM By: [name] DO Previous Signature: 08/11/2022 1:25:51 PM Version By: Valeria Batman EMT Entered By: [name] on 08/11/2022 15:23:20 [name] ([id number]) [id number] Page 2 of 2 -------------------------------------------------------------------------------- HBO Safety Checklist Details Patient Name: Date of Service: [name], [name] 08/11/2022 8:00 A M Medical Record Number: [id number] Patient Account Number: [id number] Date of Birth/Sex: Treating RN: 12-Nov-1955 (67 y.o. [name] Primary Care Lindsi Bayliss: [name] Other Clinician: Valeria Batman Referring Kenn Rekowski: Treating Aylissa Heinemann/Extender: [name] in Treatment: 17 HBO Safety Checklist Items Safety Checklist Consent Form Signed Patient voided / foley secured and emptied When did you last eato 0700 Last dose of injectable or oral agent 0615 Ostomy pouch emptied and vented if applicable NA All implantable devices assessed, documented and approved NA Intravenous access site secured and place NA Valuables secured Linens and cotton and cotton/polyester blend (less than 51% polyester) Personal oil-based products / skin lotions / body lotions removed Wigs or hairpieces removed NA Smoking or tobacco materials removed Books / newspapers / magazines / loose paper removed Cologne, aftershave, perfume and deodorant removed Jewelry  removed (may wrap wedding band) Make-up removed NA Hair care products removed Battery operated devices (external) removed Heating patches and chemical warmers removed Titanium eyewear removed NA Nail polish cured greater than 10 hours NA Casting material cured greater than 10 hours NA Hearing aids removed NA  Loose dentures or partials removed Prosthetics have been removed NA Patient demonstrates correct use of air break device (if applicable) Patient concerns have been addressed Patient grounding bracelet on and cord attached to chamber Specifics for Inpatients (complete in addition to above) Medication sheet sent with patient NA Intravenous medications needed or due during therapy sent with patient NA Drainage tubes (e.g. nasogastric tube or chest tube secured and vented) NA Endotracheal or Tracheotomy tube secured NA Cuff deflated of air and inflated with saline NA Airway suctioned NA Notes The safety checklist was done before the treatment was started. Electronic Signature(s) Signed: 08/11/2022 1:22:04 PM By: Valeria Batman EMT Entered By: Valeria Batman on 08/11/2022 13:22:04

## 2022-08-11 NOTE — Progress Notes (Signed)
MACHAI, DESMITH A ([id number]) [id number] Page 1 of 2 Visit Report for 08/11/2022 Problem List Details Patient Name: Date of Service: [name], [name] 08/11/2022 8:00 A M Medical Record Number: [id number] Patient Account Number: [id number] Date of Birth/Sex: Treating RN: 1956-01-23 (67 y.o. 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/14/2022 No Yes exposed M86.172 Other acute osteomyelitis, left ankle and foot 05/08/2022 No Yes L97.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 04/14/2022 No Yes specified severity E11.621 Type 2 diabetes mellitus with foot ulcer 04/14/2022 No Yes E11.42 Type 2 diabetes mellitus with diabetic polyneuropathy 04/14/2022 No Yes S91.301A Unspecified open wound, right foot, initial encounter 07/16/2022 No Yes 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 08/11/2022 Medical Record Number: [id number] Patient Account Number: [id number] Date of Birth/Sex: Treating RN: Feb 09, 1956 (67 y.o. 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 specified severity E11.621 Type 2 diabetes mellitus with foot ulcer E11.42 Type 2 diabetes mellitus with diabetic polyneuropathy S91.301A Unspecified open wound, right foot, initial encounter 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 severity Facility Procedures CPT4 Code Description Modifier Quantity [id number] G0277-(Facility Use Only) HBOT full body chamber, 27mn , 4 ICD-10 Diagnosis Description E11.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 M86.172 Other acute osteomyelitis, left ankle and foot Physician Procedures Quantity CPT4 Code Description Modifier [id number] - WC PHYS HYPERBARIC OXYGEN THERAPY 1 ICD-10 Diagnosis Description E11.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

## 2022-08-12 ENCOUNTER — Encounter (HOSPITAL_BASED_OUTPATIENT_CLINIC_OR_DEPARTMENT_OTHER): Payer: Medicare HMO | Admitting: Internal Medicine

## 2022-08-12 DIAGNOSIS — E11621 Type 2 diabetes mellitus with foot ulcer: Secondary | ICD-10-CM | POA: Diagnosis not present

## 2022-08-12 LAB — GLUCOSE, CAPILLARY
Glucose-Capillary: 153 mg/dL — ABNORMAL HIGH (ref 70–99)
Glucose-Capillary: 139 mg/dL — ABNORMAL HIGH (ref 70–99)

## 2022-08-12 NOTE — Progress Notes (Signed)
LYNDEL, SARATE [name] ([id number]) [id number] Page 1 of 2 Visit Report for 08/12/2022 Arrival Information Details Patient Name: Date of Service: [name], [name]. 08/12/2022 8:00 [name] Medical Record Number: [id number] Patient Account Number: [id number] Date of Birth/Sex: Treating RN: 07/06/1956 (67 y.o. [name] Primary Care Anysia Choi: [name] Other Clinician: [name] Referring Gumaro Brightbill: Treating Ziyah Cordoba/Extender: Lelon Frohlich, [name] in Treatment: 73 Visit Information History Since Last Visit All ordered tests and consults were completed: Yes Patient Arrived: Wheel Chair Added or deleted any medications: No Arrival Time: 07:32 Any new allergies or adverse reactions: No Accompanied By: None Had [name] fall or experienced change in No Transfer Assistance: EasyPivot Patient Lift activities of daily living that may affect Patient Identification Verified: Yes risk of falls: Secondary Verification Process Completed: Yes Signs or symptoms of abuse/neglect since last visito No Patient Requires Transmission-Based Precautions: No Hospitalized since last visit: No Patient Has Alerts: Yes Implantable device outside of the clinic excluding No cellular tissue based products placed in the center since last visit: Pain Present Now: No Electronic Signature(s) Signed: 08/12/2022 1:24:44 PM By: Valeria Batman EMT Entered By: Valeria Batman on 08/12/2022 13:24:43 -------------------------------------------------------------------------------- Encounter Discharge Information Details Patient Name: Date of Service: [name], [name] 08/12/2022 8:00 [name] Medical Record Number: [id number] Patient Account Number: [id number] Date of Birth/Sex: Treating RN: 12-Jun-1956 (67 y.o. [name] Primary Care Fredi Geiler: [name] Other Clinician: Valeria Batman Referring Sheng Pritz: Treating Shalisa Mcquade/Extender: Lelon Frohlich, [name]  in Treatment: 17 Encounter Discharge Information Items Discharge Condition: Stable Ambulatory Status: Wheelchair Discharge Destination: Home Transportation: Private Auto Accompanied By: None Schedule Follow-up Appointment: Yes Clinical Summary of Care: Electronic Signature(s) Signed: 08/12/2022 1:32:20 PM By: Valeria Batman EMT Entered By: Valeria Batman on 08/12/2022 13:32:20 [name] ([id number]) [id number] Page 2 of 2 -------------------------------------------------------------------------------- Vitals Details Patient Name: Date of Service: [name], [name]. 08/12/2022 8:00 [name] Medical Record Number: [id number] Patient Account Number: [id number] Date of Birth/Sex: Treating RN: 04/06/1956 (67 y.o. [name] Primary Care Madox Corkins: [name] Other Clinician: Valeria Batman Referring Brooklee Michelin: Treating Barbar Brede/Extender: Lelon Frohlich, [name] in Treatment: 17 Vital Signs Time Taken: 07:50 Capillary Blood Glucose (mg/dl): 153 Height (in): 72 Reference Range: 80 - 120 mg / dl Weight (lbs): 220 Body Mass Index (BMI): 29.8 Electronic Signature(s) Signed: 08/12/2022 1:26:23 PM By: Valeria Batman EMT Entered By: Valeria Batman on 08/12/2022 13:26:23

## 2022-08-12 NOTE — Progress Notes (Signed)
DOVBER, ERNEST A ([id number]) [id number] Page 1 of 2 Visit Report for 08/12/2022 Problem List Details Patient Name: Date of Service: [name], [name] 08/12/2022 8:00 A M Medical Record Number: [id number] Patient Account Number: [id number] Date of Birth/Sex: Treating RN: [birth year] (67 y.o. 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 04/14/2022 No Yes specified severity E11.621 Type 2 diabetes mellitus with foot ulcer 04/14/2022 No Yes E11.42 Type 2 diabetes mellitus with diabetic polyneuropathy 04/14/2022 No Yes S91.301A Unspecified open wound, right foot, initial encounter 07/16/2022 No Yes 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 08/12/2022 Medical Record Number: [id number] Patient Account Number: [id number] Date of Birth/Sex: Treating RN: 1955-09-30 (67 y.o. 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 specified severity E11.621 Type 2 diabetes mellitus with foot ulcer E11.42 Type 2 diabetes mellitus with diabetic polyneuropathy S91.301A Unspecified open wound, right foot, initial encounter 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 severity Facility Procedures CPT4 Code Description Modifier Quantity [id number] G0277-(Facility Use Only) HBOT full body chamber, 102mn , 4 ICD-10 Diagnosis Description E11.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 M86.172 Other acute osteomyelitis, left ankle and foot Physician Procedures Quantity CPT4 Code Description Modifier [id number] - WC PHYS HYPERBARIC OXYGEN THERAPY 1 ICD-10 Diagnosis Description E11.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

## 2022-08-12 NOTE — Progress Notes (Addendum)
KODIAK, ROLLYSON A ([id number]) [id number] Page 1 of 2 Visit Report for 08/12/2022 HBO Details Patient Name: Date of Service: [name], [name] 08/12/2022 8:00 A M Medical Record Number: [id number] Patient Account Number: [id number] Date of Birth/Sex: Treating RN: 03-28-1956 (67 y.o. [name] Primary Care Desaray Marschner: [name] Other Clinician: Valeria Batman Referring Bernardina Cacho: Treating Mayur Duman/Extender: Lelon Frohlich, [name] in Treatment: 17 HBO Treatment Course Details Treatment Course Number: 1 Ordering Mylah Baynes: [name] T Treatments Ordered: otal 80 HBO Treatment Start Date: 06/01/2022 HBO Indication: Diabetic Ulcer(s) of the Lower Extremity Standard/Conservative Wound Care tried and failed greater than or equal to 30 days HBO Treatment Details Treatment Number: 43 Patient Type: Outpatient Chamber Type: Monoplace Chamber Serial #: [id number] Treatment Protocol: 2.5 ATA with 90 minutes oxygen, with two 5 minute air breaks Treatment Details Compression Rate Down: 2.0 psi / minute De-Compression Rate Up: 2.0 psi / minute A breaks and breathing ir Compress Tx Pressure periods Decompress Decompress Begins Reached (leave unused spaces Begins Ends blank) Chamber Pressure (ATA 1 2.5 2.5 2.5 2.5 2.5 - - 2.5 1 ) Clock Time (24 hr) 08:17 08:32 09:02 09:07 09:37 09:42 - - 10:12 10:24 Treatment Length: 127 (minutes) Treatment Segments: 4 Vital Signs Capillary Blood Glucose Reference Range: 80 - 120 mg / dl HBO Diabetic Blood Glucose Intervention Range: <131 mg/dl or >249 mg/dl Time Vitals Blood Respiratory Capillary Blood Glucose Pulse Action Type: Pulse: Temperature: Taken: Pressure: Rate: Glucose (mg/dl): Meter #: Oximetry (%) Taken: Pre 07:50 153 Post 10:32 145/84 66 18 97.5 139 Pre 08:10 113/72 83 18 98.1 Treatment Response Treatment Toleration: Well Treatment Completion Status: Treatment Completed without Adverse  Event Treatment Notes The patient stated that he had an egg and cheese with orange juice. The patient used Afrin before treatment. Additional Procedure Documentation Tissue Sevierity: Necrosis of bone Electronic Signature(s) Signed: 08/12/2022 1:31:37 PM By: Valeria Batman EMT Signed: 08/12/2022 3:38:34 PM By: Linton Ham MD Previous Signature: 08/12/2022 1:29:42 PM Version By: Valeria Batman EMT Entered By: Valeria Batman on 08/12/2022 13:31:36 [name] ([id number]) [id number] Page 2 of 2 -------------------------------------------------------------------------------- HBO Safety Checklist Details Patient Name: Date of Service: [name], [name] 08/12/2022 8:00 A M Medical Record Number: [id number] Patient Account Number: [id number] Date of Birth/Sex: Treating RN: 02/25/1956 (67 y.o. [name] Primary Care Kaion Tisdale: [name] Other Clinician: Valeria Batman Referring Sahily Biddle: Treating Jalessa Peyser/Extender: Lelon Frohlich, [name] in Treatment: 17 HBO Safety Checklist Items Safety Checklist Consent Form Signed Patient voided / foley secured and emptied When did you last eato 0715 Last dose of injectable or oral agent 0630 Ostomy pouch emptied and vented if applicable NA All implantable devices assessed, documented and approved NA Intravenous access site secured and place NA Valuables secured Linens and cotton and cotton/polyester blend (less than 51% polyester) Personal oil-based products / skin lotions / body lotions removed Wigs or hairpieces removed NA Smoking or tobacco materials removed Books / newspapers / magazines / loose paper removed Cologne, aftershave, perfume and deodorant removed Jewelry removed (may wrap wedding band) Make-up removed NA Hair care products removed Battery operated devices (external) removed Heating patches and chemical warmers removed Titanium eyewear removed NA Nail polish cured  greater than 10 hours NA Casting material cured greater than 10 hours NA Hearing aids removed NA Loose dentures or partials removed removed by patient Prosthetics have been removed NA Patient demonstrates correct use of air break device (if applicable) Patient concerns have been addressed Patient  grounding bracelet on and cord attached to chamber Specifics for Inpatients (complete in addition to above) Medication sheet sent with patient NA Intravenous medications needed or due during therapy sent with patient NA Drainage tubes (e.g. nasogastric tube or chest tube secured and vented) NA Endotracheal or Tracheotomy tube secured NA Cuff deflated of air and inflated with saline NA Airway suctioned NA Notes The safety checklist was done before the treatment was started. Electronic Signature(s) Signed: 08/12/2022 1:28:03 PM By: Valeria Batman EMT Entered By: Valeria Batman on 08/12/2022 13:28:03

## 2022-08-13 ENCOUNTER — Encounter (HOSPITAL_BASED_OUTPATIENT_CLINIC_OR_DEPARTMENT_OTHER): Payer: Medicare HMO | Admitting: Internal Medicine

## 2022-08-13 DIAGNOSIS — L97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 severity: Secondary | ICD-10-CM

## 2022-08-13 DIAGNOSIS — L97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 specified severity: Secondary | ICD-10-CM | POA: Diagnosis not present

## 2022-08-13 DIAGNOSIS — E11621 Type 2 diabetes mellitus with foot ulcer: Secondary | ICD-10-CM

## 2022-08-13 DIAGNOSIS — L97522 Non-pressure chronic ulcer of other part of left foot with fat layer exposed: Secondary | ICD-10-CM

## 2022-08-13 DIAGNOSIS — M86172 Other acute osteomyelitis, left ankle and foot: Secondary | ICD-10-CM

## 2022-08-13 LAB — GLUCOSE, CAPILLARY
Glucose-Capillary: 150 mg/dL — ABNORMAL HIGH (ref 70–99)
Glucose-Capillary: 59 mg/dL — ABNORMAL LOW (ref 70–99)
Glucose-Capillary: 137 mg/dL — ABNORMAL HIGH (ref 70–99)

## 2022-08-13 NOTE — Progress Notes (Deleted)
Office Note     CC:  follow up Requesting Provider:  Harvie Junior, MD  HPI: [name] is a 67 y.o. (29-Jun-1956) male well-known to me who has severe peripheral arterial disease in bilateral lower extremities.    Surgical history includes: Right below-knee pop to posterior tibial artery bypass using vein, with subsequent balloon angioplasty for assisted primary patency at retained valve. Right TMA. 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  His wife has also been doing well status post hip replacement.  His right TMA is completely healed, left metatarsal amputation healing slowly, clean.  He also denied fever, chills.    Past Medical History:  Diagnosis Date   Diabetes mellitus with peripheral vascular disease    Hypertension    Neuropathy    PTSD (post-traumatic stress disorder)    Sleep apnea     Past Surgical History:  Procedure Laterality Date   ABDOMINAL AORTOGRAM W/LOWER EXTREMITY Right 09/10/2021   Procedure: ABDOMINAL AORTOGRAM W/LOWER EXTREMITY;  Surgeon: [name], MD;  Location: [facility];  Service: Cardiovascular;  Laterality: Right;   ABDOMINAL AORTOGRAM W/LOWER EXTREMITY N/A 12/17/2021   Procedure: ABDOMINAL AORTOGRAM W/LOWER EXTREMITY;  Surgeon: [name], MD;  Location: [facility];  Service: Cardiovascular;  Laterality: N/A;   ABDOMINAL AORTOGRAM W/LOWER EXTREMITY N/A 12/03/2021   Procedure: ABDOMINAL AORTOGRAM W/LOWER EXTREMITY;  Surgeon: [name], MD;  Location: [facility];  Service: Cardiovascular;  Laterality: N/A;   ABDOMINAL HERNIA REPAIR     AMPUTATION Right 09/19/2021   Procedure: TRANSMETATARSAL AMPUTATION FOOT;  Surgeon: Lorenda Peck, MD;  Location: [place];  Service: Podiatry;  Laterality: Right;  Surgical team to do local block   AMPUTATION Left 12/20/2021   Procedure: LEFT FOOT  REVISION PARTIAL FIRST RAY AMPUTATION WITH PLACEMENT OF PRESSURE WOUND VAC;  Surgeon: [name], DPM;  Location: [place];  Service: Podiatry;  Laterality: Left;   AMPUTATION TOE Right 09/15/2021   Procedure: AMPUTATION 4th TOE;  Surgeon: [name], MD;  Location: [place];  Service: Vascular;  Laterality: Right;   AMPUTATION TOE Left 12/18/2021   Procedure: AMPUTATION LEFT BIG TOE; LEFT 5TH RAY PARTIAL AMPUTATION;  Surgeon: [name], DPM;  Location: [place];  Service: Podiatry;  Laterality: Left;   BACK SURGERY     CORONARY ATHERECTOMY N/A 12/08/2021   Procedure: CORONARY ATHERECTOMY;  Surgeon: Waynetta Sandy, MD;  Location: [facility];  Service: Cardiovascular;  Laterality: N/A;  Laser - Lt. PT   FEMORAL-TIBIAL BYPASS GRAFT Right 09/15/2021   Procedure: BYPASS GRAFT BELOW KNEE POPLITEAL ARTERY TO POSTERIOR TIBIAL ARTERY;  Surgeon: [name], MD;  Location: [place];  Service: Vascular;  Laterality: Right;   IRRIGATION AND DEBRIDEMENT FOOT Right 09/17/2021   Procedure: IRRIGATION AND DEBRIDEMENT FOOT;  Surgeon: Lorenda Peck, MD;  Location: [place];  Service: Podiatry;  Laterality: Right;  Suregeon will do anesthesia block   KNEE SURGERY     LOWER EXTREMITY ANGIOGRAPHY Left 12/08/2021   Procedure: Lower Extremity Angiography;  Surgeon: Waynetta Sandy, MD;  Location: [facility];  Service: Cardiovascular;  Laterality: Left;   PERIPHERAL VASCULAR BALLOON ANGIOPLASTY  09/10/2021   Procedure: PERIPHERAL VASCULAR BALLOON ANGIOPLASTY;  Surgeon: [name], MD;  Location: [facility];  Service: Cardiovascular;;  rt sfa pta   PERIPHERAL VASCULAR BALLOON  ANGIOPLASTY Right 12/03/2021   Procedure: PERIPHERAL VASCULAR BALLOON ANGIOPLASTY;  Surgeon: [name], MD;  Location: [facility];  Service: Cardiovascular;  Laterality: Right;   PERIPHERAL VASCULAR INTERVENTION Left 12/08/2021   Procedure: PERIPHERAL VASCULAR INTERVENTION;  Surgeon: Waynetta Sandy, MD;  Location: [facility];  Service: Cardiovascular;  Laterality: Left;  LT. SFA   SHOULDER SURGERY     VEIN HARVEST Right 09/15/2021   Procedure: GREATER SAPHENOUS VEIN HARVEST;  Surgeon: [name], MD;  [facility];  Service: Vascular;  Laterality: Right;    Social History   Socioeconomic History   Marital status: Married    Spouse name: Not on file   Number of children: Not on file   Years of education: Not on file   Highest education level: Not on file  Occupational History   Not on file  Tobacco Use   Smoking status: Former    Types: Cigarettes    Passive exposure: Never   Smokeless tobacco: Never  Vaping Use   Vaping Use: Never used  Substance and Sexual Activity   Alcohol use: No   Drug use: Never   Sexual activity: Not on file  Other Topics Concern   Not on file  Social History Narrative   Not on file   Social Determinants of Health   Financial Resource Strain: Not on file  Food Insecurity: Not on file  Transportation Needs: Not on file  Physical Activity: Not on file  Stress: Not on file  Social Connections: Not on file  Intimate Partner Violence: Not on file    Family History  Problem Relation Age of Onset   Stroke Mother    Coronary artery disease Father    COPD Brother     Current Outpatient Medications  Medication Sig Dispense Refill   albuterol (VENTOLIN HFA) 108 (90 Base) MCG/ACT inhaler Inhale 2 puffs into the lungs every 6 (six) hours as needed for wheezing or shortness of breath. 8 g 0   amLODipine (NORVASC) 5 MG tablet Take 1 tablet (5 mg total) by mouth every morning. 30 tablet 0   aspirin EC 81 MG tablet Take 1 tablet (81 mg total) by mouth daily. Swallow whole. 150 tablet 2   atorvastatin (LIPITOR) 80 MG tablet Take 1 tablet (80 mg total) by mouth at bedtime. 30 tablet 0   azithromycin (ZITHROMAX) 250 MG tablet Take 1 tablet (250 mg total) by mouth daily. Take first 2 tablets together, then 1 every  day until finished. 6 tablet 0   benzonatate (TESSALON) 100 MG capsule Take 1 capsule (100 mg total) by mouth every 8 (eight) hours. 28 capsule 0   buPROPion (WELLBUTRIN XL) 300 MG 24 hr tablet Take 1 tablet (300 mg total) by mouth every morning. 30 tablet 0   cholecalciferol (VITAMIN D) 25 MCG (1000 UNIT) tablet Take 1,000 Units by mouth daily.     clopidogrel (PLAVIX) 75 MG tablet Take 1 tablet (75 mg total) by mouth daily. 30 tablet 11   empagliflozin (JARDIANCE) 25 MG TABS tablet Take 1 tablet (25 mg total) by mouth daily. 30 tablet 0   fluticasone furoate-vilanterol (BREO ELLIPTA) 100-25 MCG/INH AEPB Inhale 1 puff into the lungs daily. 60 each 5   gabapentin (NEURONTIN) 600 MG tablet Take 600 mg by mouth 2 (two) times daily.     HYDROcodone-acetaminophen (NORCO/VICODIN) 5-325 MG tablet Take 1 tablet by mouth every 6 (six) hours as needed.     insulin aspart (  NOVOLOG) 100 UNIT/ML injection CBG 70 - 120: 0 units CBG 121 - 150: 1 unit CBG 151 - 200: 2 units CBG 201 - 250: 3 units CBG 251 - 300: 5 units CBG 301 - 350: 7 units CBG 351 - 400: 9 units 10 mL 11   insulin glargine-yfgn (SEMGLEE) 100 UNIT/ML injection Inject 0.2 mLs (20 Units total) into the skin 2 (two) times daily. 10 mL 11   lisinopril (ZESTRIL) 20 MG tablet Take 20 mg by mouth daily.     lurasidone (LATUDA) 40 MG TABS tablet Take 1 tablet (40 mg total) by mouth daily with supper. 30 tablet 0   meclizine (ANTIVERT) 25 MG tablet Take 1 tablet (25 mg total) by mouth 3 (three) times daily as needed for dizziness. 30 tablet 0   metoprolol tartrate (LOPRESSOR) 50 MG tablet Take 1 tablet (50 mg total) by mouth 2 (two) times daily. 60 tablet 0   OLANZapine (ZYPREXA) 2.5 MG tablet Take 1 tablet (2.5 mg total) by mouth at bedtime. 30 tablet 0   omeprazole (PRILOSEC) 20 MG capsule Take 1 capsule (20 mg total) by mouth at bedtime. 30 capsule 0   OXcarbazepine (TRILEPTAL) 150 MG tablet Take 1 tablet (150 mg total) by mouth 2 (two) times daily. 60  tablet 0   Oxycodone HCl 10 MG TABS Take 10 mg by mouth every 6 (six) hours as needed.     senna-docusate (SENOKOT-S) 8.6-50 MG tablet Take 1 tablet by mouth at bedtime as needed for mild constipation.     No current facility-administered medications for this visit.    Allergies  Allergen Reactions   Gemfibrozil Other (See Comments)    Doesn't know its been awhile   Oxycontin [Oxycodone Hcl] Other (See Comments)    Bad reaction "took me to a place I never wanna go again"   Pork-Derived Products    Simvastatin Other (See Comments)    Says he doesn't know its been awhile     REVIEW OF SYSTEMS:   '[X]'$  denotes positive finding, '[ ]'$  denotes negative finding Cardiac  Comments:  Chest pain or chest pressure:    Shortness of breath upon exertion:    Short of breath when lying flat:    Irregular heart rhythm:        Vascular    Pain in calf, thigh, or hip brought on by ambulation:    Pain in feet at night that wakes you up from your sleep:     Blood clot in your veins:    Leg swelling:         Pulmonary    Oxygen at home:    Productive cough:     Wheezing:         Neurologic    Sudden weakness in arms or legs:     Sudden numbness in arms or legs:     Sudden onset of difficulty speaking or slurred speech:    Temporary loss of vision in one eye:     Problems with dizziness:         Gastrointestinal    Blood in stool:     Vomited blood:         Genitourinary    Burning when urinating:     Blood in urine:        Psychiatric    Major depression:         Hematologic    Bleeding problems:    Problems with blood clotting too easily:  Skin    Rashes or ulcers:        Constitutional    Fever or chills:      PHYSICAL EXAMINATION:  There were no vitals filed for this visit.   General:  WDWN in NAD; vital signs documented above Gait: Not observed HENT: WNL, normocephalic Pulmonary: normal non-labored breathing , without Rales, rhonchi,  wheezing Cardiac:  regular HR Abdomen: soft, NT, no masses Skin: without rashes Vascular Exam/Pulses: Excellent Doppler signals bilaterally. Extremities: Feet bandaged. Musculoskeletal: no muscle wasting or atrophy  Neurologic: A&O X 3;  No focal weakness or paresthesias are detected Psychiatric:  The pt has Normal affect.   Non-Invasive Vascular Imaging:    Right Graft #1: BK-PTA  +------------------+--------+---------------+--------+--------+                    PSV cm/sStenosis       WaveformComments  +------------------+--------+---------------+--------+--------+  Inflow            83                     biphasic          +------------------+--------+---------------+--------+--------+  Prox Anastomosis  164                    biphasic          +------------------+--------+---------------+--------+--------+  Proximal Graft    104                    biphasic          +------------------+--------+---------------+--------+--------+  Mid Graft         88                     biphasic          +------------------+--------+---------------+--------+--------+  Distal Graft      95                     biphasic          +------------------+--------+---------------+--------+--------+  Distal Anastomosis312     50-70% stenosisbiphasic          +------------------+--------+---------------+--------+--------+  Outflow           250     50-74% stenosisbiphasic          +------------------+--------+---------------+--------+--------+    Left Stent(s):  +---------------+--------+---------------+--------+--------+  SFA            PSV cm/sStenosis       WaveformComments  +---------------+--------+---------------+--------+--------+  Prox to Stent  211     1-49% stenosis biphasic          +---------------+--------+---------------+--------+--------+  Proximal Stent 318     50-99% stenosisbiphasic           +---------------+--------+---------------+--------+--------+  Mid Stent      95                     biphasic          +---------------+--------+---------------+--------+--------+  Distal Stent   191                    biphasic          +---------------+--------+---------------+--------+--------+  Distal to [id number]                    biphasic          +---------------+--------+---------------+--------+--------+   ASSESSMENT/PLAN:: 67 y.o. male here for follow up status post  right BK pop-PT bypass, right lower extremity angiogram for assisted bypass patency-retained valve lysis, left lower extremity SFA pop stenting, native posterior tibial artery angioplasty.  ABIs stable from last visit Serial duplex ultrasound demonstrated stable, elevated velocities in the left proximal SFA stent.  This has been duplex in the past and demonstrated no stenosis.  There is some increase in velocity in the distal anastomosis of his below-knee popliteal to posterior tibial artery venous bypass.  Within the bypass are acceptable. Hakiem and I discussed right lower extremity angiogram for primary assisted patency versus shorter follow-up.  Being that the left leg continues heal slowly, and the right TMA is healed.  I agree would be best served with a short interval follow-up.  ABIs reviewed demonstrating stable ABI in the right lower extremity slightly depressed ABI in the left lower extremity.  Left lower extremity was recently angio'd demonstrating no flow-limiting stenosis, therefore we will continue to treat conservatively.  He was asked to call my office should any questions or concerns arise or should wound healing stagnate.  I plan to see him in 3 months with repeat studies.   [name],  [facility] [phone]

## 2022-08-13 NOTE — Progress Notes (Addendum)
[name], [name] ([id number]) [id number] Page 1 of 2 Visit Report for 08/13/2022 HBO Details Patient Name: Date of Service: [name], [name] 08/13/2022 8:00 A M Medical Record Number: [id number] Patient Account Number: [id number] Date of Birth/Sex: Treating RN: 08/04/1955 (67 y.o. [name], [name] Primary Care Doyle Kunath: [name] Other Clinician: [name] Referring Jojo Geving: Treating Derhonda Eastlick/Extender: [name] in Treatment: 17 HBO Treatment Course Details Treatment Course Number: 1 Ordering Gale Klar: [name] T Treatments Ordered: otal 80 HBO Treatment Start Date: 06/01/2022 HBO Indication: Diabetic Ulcer(s) of the Lower Extremity Standard/Conservative Wound Care tried and failed greater than or equal to 30 days HBO Treatment Details Treatment Number: 44 Patient Type: Outpatient Chamber Type: Monoplace Chamber Serial #: [id number] Treatment Protocol: 2.5 ATA with 90 minutes oxygen, with two 5 minute air breaks Treatment Details Compression Rate Down: 1.5 psi / minute De-Compression Rate Up: 2.0 psi / minute A breaks and breathing ir Compress Tx Pressure periods Decompress Decompress Begins Reached (leave unused spaces Begins Ends blank) Chamber Pressure (ATA 1 2.5 2.5 2.5 2.5 2.5 - - 2.5 1 ) Clock Time (24 hr) 08:09 08:23 08:53 08:58 09:28 09:33 - - 10:03 10:16 Treatment Length: 127 (minutes) Treatment Segments: 4 Vital Signs Capillary Blood Glucose Reference Range: 80 - 120 mg / dl HBO Diabetic Blood Glucose Intervention Range: <131 mg/dl or >249 mg/dl Type: Time Vitals Blood Pulse: Respiratory Temperature: Capillary Blood Glucose Pulse Action Taken: Pressure: Rate: Glucose (mg/dl): Meter #: Oximetry (%) Taken: Pre 08:02 130/76 84 16 97.5 150 1 none per protocol Post 10:25 151/76 66 16 97.7 59 1 Given 8 oz Glucerna, 2x4oz Orange Juice Post 11:09 137 Treatment Response Treatment Toleration:  Well Treatment Completion Status: Treatment Completed without Adverse Event Treatment Notes Patient arrived, blood glucose level checked, resulting in 150 mg/dL. Patient states that his blood glucose at home was 173 mg/dL and that he ate egg and cheese and orange juice this morning. Patient prepared for treatment. After safety check, patient was placed in the chamber and chamber was compressed at 1 psi/min until reaching 3 psig. Rate set increased to 2 psi/min travelling at that rate until reaching treatment pressure of 2.5 ATA. Patient tolerated treatment well and also tolerated decompression of the chamber at 2 psi/min. Patient denied any symptoms of barotrauma. Patient's post-treatment blood glucose was was 59 mg/dL. Patient was given an 8 oz Glucerna and 2x4 oz orange juice on way to wound care encounter. Secondary post-treatment glucose taken at 1109 was 137 mg/dL. Additional Procedure Documentation Tissue Sevierity: Necrosis of bone Physician HBO Attestation: I certify that I supervised this HBO treatment in accordance with Medicare guidelines. A trained emergency response team is readily available per Yes hospital policies and procedures. Continue HBOT as ordered. Yes Electronic Signature(s) ACETON, KINNEAR A ([id number]) [id number] Page 2 of 2 Signed: 08/13/2022 3:04:17 PM By: [name] DO Previous Signature: 08/13/2022 1:50:38 PM Version By: [name] CHT EMT BS , , Previous Signature: 08/13/2022 9:11:49 AM Version By: [name] CHT EMT BS , , Previous Signature: 08/13/2022 9:09:30 AM Version By: [name] CHT EMT BS , , Entered By: [name] on 08/13/2022 15:03:43 -------------------------------------------------------------------------------- HBO Safety Checklist Details Patient Name: Date of Service: [name], [name] 08/13/2022 8:00 A M Medical Record Number: [id number] Patient Account Number: [id number] Date of  Birth/Sex: Treating RN: 1955/07/09 (67 y.o. [name] Primary Care Brent Taillon: [name] Other Clinician: [name] Referring [name]: Treating Colbie Danner/Extender: [name],  [name] in Treatment: 17 HBO Safety Checklist Items Safety Checklist Consent Form Signed Patient voided / foley secured and emptied When did you last eato 0730 Last dose of injectable or oral agent 0630 Ostomy pouch emptied and vented if applicable NA All implantable devices assessed, documented and approved NA Intravenous access site secured and place NA Valuables secured Linens and cotton and cotton/polyester blend (less than 51% polyester) Personal oil-based products / skin lotions / body lotions removed Wigs or hairpieces removed NA Smoking or tobacco materials removed NA Books / newspapers / magazines / loose paper removed Cologne, aftershave, perfume and deodorant removed Jewelry removed (may wrap wedding band) Make-up removed Hair care products removed Battery operated devices (external) removed Heating patches and chemical warmers removed Titanium eyewear removed Nail polish cured greater than 10 hours NA Casting material cured greater than 10 hours NA Hearing aids removed NA Loose dentures or partials removed left at home Prosthetics have been removed NA Patient demonstrates correct use of air break device (if applicable) Patient concerns have been addressed Patient grounding bracelet on and cord attached to chamber Specifics for Inpatients (complete in addition to above) Medication sheet sent with patient NA Intravenous medications needed or due during therapy sent with patient NA Drainage tubes (e.g. nasogastric tube or chest tube secured and vented) NA Endotracheal or Tracheotomy tube secured NA Cuff deflated of air and inflated with saline NA Airway suctioned NA Notes Paper version used prior to treatment. Electronic Signature(s) Signed:  08/13/2022 8:52:24 AM By: [name] CHT EMT BS , , Entered By: [name] on 08/13/2022 08:52:23

## 2022-08-13 NOTE — Progress Notes (Signed)
GURLEY, CLIMER A ([id number]) [id number] Page 1 of 1 Visit Report for 08/13/2022 SuperBill Details Patient Name: Date of Service: [name], [name] 08/13/2022 Medical Record Number: [id number] Patient Account Number: [id number] Date of Birth/Sex: Treating RN: Aug 13, 1955 (67 y.o. [name], [name] Primary Care Provider: [name] Other Clinician: [name] Referring Provider: Treating Provider/Extender: [name] in Treatment: 17 Diagnosis Coding ICD-10 Codes Code Description [phone] Non-pressure chronic ulcer of other part of left foot with fat layer exposed M86.172 Other acute osteomyelitis, left ankle and foot L97.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 specified severity E11.621 Type 2 diabetes mellitus with foot ulcer E11.42 Type 2 diabetes mellitus with diabetic polyneuropathy S91.301A Unspecified open wound, right foot, initial encounter 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 severity Facility Procedures CPT4 Code Description Modifier Quantity [id number]-(Facility Use Only) HBOT full body chamber, 41mn , 4 ICD-10 Diagnosis Description E11.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 M86.172 Other acute osteomyelitis, left ankle and foot Physician Procedures Quantity CPT4 Code Description Modifier [phone] - WC PHYS HYPERBARIC OXYGEN THERAPY 1 ICD-10 Diagnosis Description E11.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 M86.172 Other acute osteomyelitis, left ankle and  foot Electronic Signature(s) Signed: 08/13/2022 1:51:10 PM By: SDonavan BurnetCHT EMT BS , , Signed: 08/13/2022 3:04:17 PM By: HKalman ShanDO Entered By: SDonavan Burneton 08/13/2022 13:51:10

## 2022-08-13 NOTE — Progress Notes (Addendum)
ERCELL, RAZON [name] ([id number]) [id number] Page 1 of 2 Visit Report for 08/13/2022 Arrival Information Details Patient Name: Date of Service: [name], [name] 08/13/2022 8:00 [name] Medical Record Number: [id number] Patient Account Number: [id number] Date of Birth/Sex: Treating RN: 1955-07-12 (67 y.o. [name], [name] Primary Care Uel Davidow: [name] Other Clinician: [name] Referring Shelley Pooley: Treating Hendrick Pavich/Extender: [name] in Treatment: 73 Visit Information History Since Last Visit All ordered tests and consults were completed: Yes Patient Arrived: Kasandra Knudsen Added or deleted any medications: No Arrival Time: 07:30 Any new allergies or adverse reactions: No Accompanied By: self Had [name] fall or experienced change in No Transfer Assistance: None activities of daily living that may affect Patient Identification Verified: Yes risk of falls: Secondary Verification Process Completed: Yes Signs or symptoms of abuse/neglect since last visito No Patient Requires Transmission-Based Precautions: No Hospitalized since last visit: No Patient Has Alerts: Yes Implantable device outside of the clinic excluding No cellular tissue based products placed in the center since last visit: Pain Present Now: No Notes Patient arrives with [name] cane and then uses wheel chair. Electronic Signature(s) Signed: 08/13/2022 8:50:44 AM By: [name] CHT EMT BS , , Previous Signature: 08/13/2022 8:50:09 AM Version By: [name] CHT EMT BS , , Previous Signature: 08/13/2022 8:49:43 AM Version By: [name] CHT EMT BS , , Entered By: [name] on 08/13/2022 08:50:43 -------------------------------------------------------------------------------- Encounter Discharge Information Details Patient Name: Date of Service: [name], [name] 08/13/2022 8:00 [name] Medical Record Number: [id number] Patient Account Number:  [id number] Date of Birth/Sex: Treating RN: 29-Jun-1956 (67 y.o. [name] Primary Care Caroleena Paolini: [name] Other Clinician: [name] Referring Elynore Dolinski: Treating Sadik Piascik/Extender: [name] in Treatment: 17 Encounter Discharge Information Items Discharge Condition: Stable Ambulatory Status: Wheelchair Discharge Destination: Other (Note Required) Transportation: Other Accompanied By: staff Schedule Follow-up Appointment: No Clinical Summary of Care: Notes wound care appointment after HBOT. Electronic Signature(s) Signed: 08/13/2022 1:52:01 PM By: [name] CHT EMT BS , , Entered By: [name] on 08/13/2022 13:52:01 [name] ([id number]) [phone].pdf Page 2 of 2 -------------------------------------------------------------------------------- Vitals Details Patient Name: Date of Service: [name], [name] 08/13/2022 8:00 [name] Medical Record Number: [id number] Patient Account Number: [id number] Date of Birth/Sex: Treating RN: May 18, 1956 (67 y.o. [name], [name] Primary Care Ramadan Couey: [name] Other Clinician: [name] Referring Mattye Verdone: Treating Sevyn Markham/Extender: [name] in Treatment: 17 Vital Signs Time Taken: 08:02 Temperature (F): 97.5 Height (in): 72 Pulse (bpm): 84 Weight (lbs): 220 Respiratory Rate (breaths/min): 16 Body Mass Index (BMI): 29.8 Blood Pressure (mmHg): 130/76 Capillary Blood Glucose (mg/dl): 150 Reference Range: 80 - 120 mg / dl Electronic Signature(s) Signed: 08/13/2022 8:51:32 AM By: [name] CHT EMT BS , , Entered By: [name] on 08/13/2022 08:51:32

## 2022-08-14 ENCOUNTER — Ambulatory Visit (HOSPITAL_COMMUNITY): Payer: Medicare HMO

## 2022-08-14 ENCOUNTER — Encounter (HOSPITAL_BASED_OUTPATIENT_CLINIC_OR_DEPARTMENT_OTHER): Payer: Medicare HMO | Admitting: Internal Medicine

## 2022-08-14 ENCOUNTER — Ambulatory Visit: Payer: Medicare HMO | Admitting: Vascular Surgery

## 2022-08-14 DIAGNOSIS — M86172 Other acute osteomyelitis, left ankle and foot: Secondary | ICD-10-CM | POA: Diagnosis not present

## 2022-08-14 DIAGNOSIS — E11621 Type 2 diabetes mellitus with foot ulcer: Secondary | ICD-10-CM | POA: Diagnosis not present

## 2022-08-14 DIAGNOSIS — L97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 specified severity: Secondary | ICD-10-CM

## 2022-08-14 DIAGNOSIS — L97522 Non-pressure chronic ulcer of other part of left foot with fat layer exposed: Secondary | ICD-10-CM

## 2022-08-14 LAB — GLUCOSE, CAPILLARY
Glucose-Capillary: 118 mg/dL — ABNORMAL HIGH (ref 70–99)
Glucose-Capillary: 99 mg/dL (ref 70–99)
Glucose-Capillary: 77 mg/dL (ref 70–99)

## 2022-08-14 NOTE — Progress Notes (Signed)
VESTA, [name] [name] ([id number]) [id number] Page 1 of 10 Visit Report for 08/13/2022 Arrival Information Details Patient Name: Date of Service: [name], [name]. 08/13/2022 10:15 [name] Medical Record Number: [id number] Patient Account Number: [id number] Date of Birth/Sex: Treating RN: Mar 16, 1956 (67 y.o. M) Primary Care Dreama Kuna: [name] Other Clinician: Referring Jinna Weinman: Treating Dametra Whetsel/Extender: [name] in Treatment: 61 Visit Information History Since Last Visit Added or deleted any medications: No Patient Arrived: Wheel Chair Any new allergies or adverse reactions: No Arrival Time: 10:34 Had [name] fall or experienced change in No Accompanied By: self activities of daily living that may affect Transfer Assistance: None risk of falls: Patient Identification Verified: Yes Signs or symptoms of abuse/neglect since last visito No Secondary Verification Process Completed: Yes Hospitalized since last visit: No Patient Requires Transmission-Based Precautions: No Implantable device outside of the clinic excluding No Patient Has Alerts: Yes cellular tissue based products placed in the center since last visit: Has Dressing in Place as Prescribed: Yes Pain Present Now: No Electronic Signature(s) Signed: 08/14/2022 1:13:01 PM By: [name] Entered By: [name] on 08/13/2022 10:35:40 -------------------------------------------------------------------------------- Encounter Discharge Information Details Patient Name: Date of Service: [name], [name] 08/13/2022 10:15 [name] Medical Record Number: [id number] Patient Account Number: [id number] Date of Birth/Sex: Treating RN: [birth year] (67 y.o. [name] Primary Care Twyla Dais: [name] Other Clinician: Referring Kristell Wooding: Treating Remmy Riffe/Extender: [name] in Treatment: 17 Encounter Discharge Information Items Post Procedure  Vitals Discharge Condition: Stable Temperature (F): 97.7 Ambulatory Status: Wheelchair Pulse (bpm): 66 Discharge Destination: Home Respiratory Rate (breaths/min): 17 Transportation: Private Auto Blood Pressure (mmHg): 151/76 Accompanied By: self Schedule Follow-up Appointment: Yes Clinical Summary of Care: Electronic Signature(s) Signed: 08/13/2022 5:24:12 PM By: [name] RN, [name] Entered By: [name] on 08/13/2022 11:27:35 [name] ([id number]) [id number] Page 2 of 10 -------------------------------------------------------------------------------- Lower Extremity Assessment Details Patient Name: Date of Service: [name], [name]. 08/13/2022 10:15 [name] Medical Record Number: [id number] Patient Account Number: [id number] Date of Birth/Sex: Treating RN: 1956/01/17 (67 y.o. M) Primary Care Ota Ebersole: [name] Other Clinician: Referring Ambri Miltner: Treating Ronith Berti/Extender: [name], [name] in Treatment: 17 Edema Assessment Assessed: [Left: No] [name]: No] Edema: [Left: Yes] [Right: No] Calf Left: Right: Point of Measurement: 36 cm From Medial Instep 38.7 cm 38.8 cm Ankle Left: Right: Point of Measurement: 9 cm From Medial Instep 22 cm 22 cm Electronic Signature(s) Signed: 08/14/2022 1:13:01 PM By: [name] Entered By: [name] on 08/13/2022 10:47:14 -------------------------------------------------------------------------------- Multi Wound Chart Details Patient Name: Date of Service: [name], [name] 08/13/2022 10:15 [name] Medical Record Number: [id number] Patient Account Number: [id number] Date of Birth/Sex: Treating RN: 13-Aug-1955 (67 y.o. M) Primary Care [name]: [name] Other Clinician: Referring [name]: Treating Azaela Caracci/Extender: [name], [name] in Treatment: 17 Vital Signs Height(in): 72 Capillary Blood Glucose(mg/dl): 59 Weight(lbs): 220 Pulse(bpm):  68 Body Mass Index(BMI): 29.8 Blood Pressure(mmHg): 151/76 Temperature(F): 97.7 Respiratory Rate(breaths/min): 17 [5:Photos:] [[name]:[name]] Left, Lateral Foot Right Amputation Site - [name] Wound Location: Transmetatarsal Surgical Injury Gradually Appeared [name] Wounding Event: Diabetic Wound/Ulcer of the Lower Diabetic Wound/Ulcer of the Lower [name] Primary Etiology: Extremity Extremity Open Surgical Wound [name] [name] Secondary Etiology: JERMAIL, ROMRELL ([id number]) [id number] Page 3 of 10 Asthma, Sleep Apnea, Hypertension, Asthma, Sleep Apnea, Hypertension, [name] Comorbid History: Peripheral Venous Disease, Type II Peripheral Venous Disease, Type II Diabetes, Osteomyelitis, Neuropathy Diabetes, Osteomyelitis, Neuropathy 12/20/2021 07/16/2022  [name] Date Acquired: 52 4 [name] Weeks of Treatment: Open Open [name] Wound Status: No No [name] Wound Recurrence: 4.5x0.6x0.4 0.4x0.2x0.5 [name] Measurements L x W x D (cm) 2.121 0.063 [name] [name] (cm) : rea 0.848 0.031 [name] Volume (cm) : 78.80% 93.60% [name] % Reduction in [name] rea: 83.00% 96.80% [name] % Reduction in Volume: Grade 3 Grade 2 [name] Classification: Medium Medium [name] Exudate [name] mount: Serosanguineous Serosanguineous [name] Exudate Type: red, brown red, brown [name] Exudate Color: Epibole [name] [name] Wound Margin: Large (67-100%) Large (67-100%) [name] Granulation [name] mount: Red, Pink Red [name] Granulation Quality: Small (1-33%) None Present (0%) [name] Necrotic [name] mount: Fat Layer (Subcutaneous Tissue): Yes Fat Layer (Subcutaneous Tissue): Yes [name] Exposed Structures: Fascia: No Fascia: No Tendon: No Tendon: No Muscle: No Muscle: No Joint: No Joint: No Bone: No Bone: No Large (67-100%) None [name] Epithelialization: Debridement - Excisional Debridement - Excisional [name] Debridement: Pre-procedure Verification/Time Out 11:10 11:10 [name] Taken: Lidocaine 4% Topical Solution Lidocaine 4% Topical Solution [name] Pain Control: Callus,  Subcutaneous, Slough Callus, Subcutaneous, Slough [name] Tissue Debrided: Skin/Subcutaneous Tissue Skin/Subcutaneous Tissue [name] Level: 2.7 4 [name] Debridement [name] (sq cm): rea Curette Curette [name] Instrument: Minimum Minimum [name] Bleeding: Pressure Pressure [name] Hemostasis [name] chieved: 0 0 [name] Procedural Pain: 0 0 [name] Post Procedural Pain: Procedure was tolerated well Procedure was tolerated well [name] Debridement Treatment Response: 4.5x0.6x0.3 0x0.2x0.5 [name] Post Debridement Measurements L x W x D (cm) 0.636 0 [name] Post Debridement Volume: (cm) Callus: Yes Callus: Yes [name] Periwound Skin Texture: Excoriation: No Excoriation: No Induration: No Induration: No Crepitus: No Crepitus: No Rash: No Rash: No Scarring: No Scarring: No Maceration: No Maceration: No [name] Periwound Skin Moisture: Dry/Scaly: No Dry/Scaly: No Atrophie Blanche: No Atrophie Blanche: No [name] Periwound Skin Color: Cyanosis: No Cyanosis: No Ecchymosis: No Ecchymosis: No Erythema: No Erythema: No Hemosiderin Staining: No Hemosiderin Staining: No Mottled: No Mottled: No Pallor: No Pallor: No Rubor: No Rubor: No No Abnormality [name] [name] Temperature: Cellular or Tissue Based Product Cellular or Tissue Based Product [name] Procedures Performed: Debridement Debridement Treatment Notes Wound #5 (Foot) Wound Laterality: Left, Lateral Cleanser Wound Cleanser Discharge Instruction: Cleanse the wound with wound cleanser prior to applying [name] clean dressing using gauze sponges, not tissue or cotton balls. Peri-Wound Care Skin Prep Discharge Instruction: Use skin prep as directed Topical Primary Dressing Grafix #10 Discharge Instruction: applied by Benny Henrie. adaptic and steri-strips Discharge Instruction: to secure grafix to wound bed. leave in place. KADDEN, DEVONE [name] ([id number]) [id number] Page 4 of 10 Secondary Dressing ABD Pad, 8x10 Discharge Instruction: Apply over primary  dressing as directed. Secured With [employer], 4.5x3.1 (in/yd) Discharge Instruction: Secure with Kerlix as directed. 60M Medipore H Soft Cloth Surgical T ape, 4 x 10 (in/yd) Discharge Instruction: Secure with tape as directed. Compression Wrap Compression Stockings Add-Ons Wound #6 (Amputation Site - Transmetatarsal) Wound Laterality: Right Cleanser Wound Cleanser Discharge Instruction: Cleanse the wound with wound cleanser prior to applying [name] clean dressing using gauze sponges, not tissue or cotton balls. Peri-Wound Care Skin Prep Discharge Instruction: Use skin prep as directed Topical Primary Dressing Grafix #10 Discharge Instruction: applied by Glori Machnik. adaptic and steri-strips Discharge Instruction: to secure grafix to wound bed. leave in place. Secondary Dressing ABD Pad, 8x10 Discharge Instruction: Apply over primary dressing as directed. Secured With [employer], 4.5x3.1 (in/yd) Discharge Instruction: Secure with Kerlix as directed. 60M Medipore H Soft Cloth Surgical T ape, 4 x 10 (in/yd) Discharge Instruction: Secure with tape as directed. Compression Wrap  Compression Stockings Add-Ons Electronic Signature(s) Signed: 08/13/2022 3:04:17 PM By: [name] DO Entered By: [name] on 08/13/2022 11:32:52 -------------------------------------------------------------------------------- [place] Details Patient Name: Date of Service: [name], [name] 08/13/2022 10:15 [name] Medical Record Number: [id number] Patient Account Number: [id number] Date of Birth/Sex: Treating RN: 11-01-1955 (67 y.o. [name] Primary Care Taevon Aschoff: [name] Other Clinician: Referring Leilan Bochenek: Treating Ameyah Bangura/Extender: [name], [name] in Treatment: [address] [name] ([id number]) [id number] Page 5 of 10 Active Inactive Osteomyelitis Nursing Diagnoses: Infection:  osteomyelitis Goals: Diagnostic evaluation for osteomyelitis completed as ordered Date Initiated: 04/23/2022 Target Resolution Date: 09/04/2022 Goal Status: Active Signs and symptoms for osteomyelitis will be recognized and promptly addressed Date Initiated: 04/23/2022 Target Resolution Date: 09/04/2022 Goal Status: Active Interventions: Assess for signs and symptoms of osteomyelitis resolution every visit Provide education on osteomyelitis Screen for HBO Treatment Activities: Biopsy : 04/16/2022 Consult for HBO : 04/23/2022 Surgical debridement : 04/23/2022 Systemic antibiotics : 04/23/2022 T ordered outside of clinic : 04/23/2022 est Notes: Wound/Skin Impairment Nursing Diagnoses: Knowledge deficit related to ulceration/compromised skin integrity Goals: Patient/caregiver will verbalize understanding of skin care regimen Date Initiated: 04/14/2022 Target Resolution Date: 09/04/2022 Goal Status: Active Interventions: Assess patient/caregiver ability to perform ulcer/skin care regimen upon admission and as needed Assess ulceration(s) every visit Provide education on ulcer and skin care Notes: Electronic Signature(s) Signed: 08/13/2022 5:24:12 PM By: [name] RN, [name] Entered By: [name] on 08/13/2022 11:15:32 -------------------------------------------------------------------------------- Pain Assessment Details Patient Name: Date of Service: [name], [name] 08/13/2022 10:15 [name] Medical Record Number: [id number] Patient Account Number: [id number] Date of Birth/Sex: Treating RN: 06/01/1956 (67 y.o. M) Primary Care Zyan Mirkin: [name] Other Clinician: Referring Maycel Riffe: Treating Khaniya Tenaglia/Extender: [name] in Treatment: 17 Active Problems Location of Pain Severity and Description of Pain Patient Has Paino No Site Locations [place], [place] [name] ([id number]) [id number] Page 6 of 10 Pain Management and  Medication Current Pain Management: Electronic Signature(s) Signed: 08/14/2022 1:13:01 PM By: [name] Entered By: [name] on 08/13/2022 10:36:21 -------------------------------------------------------------------------------- Patient/Caregiver Education Details Patient Name: Date of Service: [name], Vinetta Bergamo 2/8/2024andnbsp10:15 [name] Medical Record Number: [id number] Patient Account Number: [id number] Date of Birth/Gender: Treating RN: 01-04-1956 (67 y.o. [name] Primary Care Physician: [name] Other Clinician: Referring Physician: Treating Physician/Extender: [name] in Treatment: 17 Education Assessment Education Provided To: Patient Education Topics Provided Wound/Skin Impairment: Handouts: Caring for Your Ulcer Methods: Explain/Verbal Responses: Reinforcements needed Electronic Signature(s) Signed: 08/13/2022 5:24:12 PM By: [name] RN, [name] Entered By: [name] on 08/13/2022 11:16:04 -------------------------------------------------------------------------------- Wound Assessment Details Patient Name: Date of Service: [name], [name] 08/13/2022 10:15 [name] Medical Record Number: [id number] Patient Account Number: [id number] Date of Birth/Sex: Treating RN: 05/25/1956 (67 y.o. M) Primary Care Barba Solt: [name] Other Clinician: [name] ([id number]) [id number] Page 7 of 10 Referring Ezmae Speers: Treating Lamarius Dirr/Extender: [name], [name] in Treatment: 17 Wound Status Wound Number: 5 Primary Diabetic Wound/Ulcer of the Lower Extremity Etiology: Wound Location: Left, Lateral Foot Secondary Open Surgical Wound Wounding Event: Surgical Injury Etiology: Date Acquired: 12/20/2021 Wound Open Weeks Of Treatment: 17 Status: Clustered Wound: No Comorbid Asthma, Sleep Apnea, Hypertension, Peripheral Venous Disease, History: Type II Diabetes,  Osteomyelitis, Neuropathy Photos Wound Measurements Length: (cm) 4.5 Width: (cm) 0.6 Depth: (cm) 0.4 Area: (cm) 2.121 Volume: (cm) 0.848 % Reduction in Area: 78.8% % Reduction in Volume: 83% Epithelialization:  Large (67-100%) Tunneling: No Undermining: No Wound Description Classification: Grade 3 Wound Margin: Epibole Exudate Amount: Medium Exudate Type: Serosanguineous Exudate Color: red, brown Foul Odor After Cleansing: No Slough/Fibrino Yes Wound Bed Granulation Amount: Large (67-100%) Exposed Structure Granulation Quality: Red, Pink Fascia Exposed: No Necrotic Amount: Small (1-33%) Fat Layer (Subcutaneous Tissue) Exposed: Yes Necrotic Quality: Adherent Slough Tendon Exposed: No Muscle Exposed: No Joint Exposed: No Bone Exposed: No Periwound Skin Texture Texture Color No Abnormalities Noted: No No Abnormalities Noted: Yes Callus: Yes Temperature / Pain Crepitus: No Temperature: No Abnormality Excoriation: No Induration: No Rash: No Scarring: No Moisture No Abnormalities Noted: No Dry / Scaly: No Maceration: No Treatment Notes Wound #5 (Foot) Wound Laterality: Left, Lateral Cleanser Wound Cleanser Discharge Instruction: Cleanse the wound with wound cleanser prior to applying [name] clean dressing using gauze sponges, not tissue or cotton balls. Peri-Wound Care Skin Prep JAQUEL, GREENLEAF [name] ([id number]) [phone].pdf Page 8 of 10 Discharge Instruction: Use skin prep as directed Topical Primary Dressing Grafix #10 Discharge Instruction: applied by Arvel Oquinn. adaptic and steri-strips Discharge Instruction: to secure grafix to wound bed. leave in place. Secondary Dressing ABD Pad, 8x10 Discharge Instruction: Apply over primary dressing as directed. Secured With [employer], 4.5x3.1 (in/yd) Discharge Instruction: Secure with Kerlix as directed. 61M Medipore H Soft Cloth Surgical T ape, 4 x 10 (in/yd) Discharge Instruction:  Secure with tape as directed. Compression Wrap Compression Stockings Add-Ons Electronic Signature(s) Signed: 08/14/2022 1:13:01 PM By: [name] Entered By: [name] on 08/13/2022 10:51:24 -------------------------------------------------------------------------------- Wound Assessment Details Patient Name: Date of Service: [name], [name] 08/13/2022 10:15 [name] Medical Record Number: [id number] Patient Account Number: [id number] Date of Birth/Sex: Treating RN: 05-Jul-1956 (67 y.o. 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 Wound Open Wounding Event: Gradually Appeared Status: Date Acquired: 07/16/2022 Comorbid Asthma, Sleep Apnea, Hypertension, Peripheral Venous Disease, Weeks Of Treatment: 4 History: Type II Diabetes, Osteomyelitis, Neuropathy Clustered Wound: No Photos Wound Measurements Length: (cm) 0.4 Width: (cm) 0.2 Depth: (cm) 0.5 Area: (cm) 0.063 Russell, Damontay [name] ([id number]) Volume: (cm) 0.031 % Reduction in Area: 93.6% % Reduction in Volume: 96.8% Epithelialization: None Tunneling: No [id number] Page 9 of 10 Undermining: No Wound Description Classification: Grade 2 Exudate Amount: Medium Exudate Type: Serosanguineous Exudate Color: red, brown Foul Odor After Cleansing: No Slough/Fibrino Yes Wound Bed Granulation Amount: Large (67-100%) Exposed Structure Granulation Quality: Red Fascia Exposed: No Necrotic Amount: None Present (0%) Fat Layer (Subcutaneous Tissue) Exposed: Yes Tendon Exposed: No Muscle Exposed: No Joint Exposed: No Bone Exposed: No Periwound Skin Texture Texture Color No Abnormalities Noted: No No Abnormalities Noted: No Callus:  Yes Atrophie Blanche: No Crepitus: No Cyanosis: No Excoriation: No Ecchymosis: No Induration: No Erythema: No Rash: No Hemosiderin Staining: No Scarring: No Mottled: No Pallor: No Moisture Rubor: No No Abnormalities Noted: No Dry / Scaly: No Maceration: No Treatment Notes Wound #6 (Amputation Site - Transmetatarsal) Wound Laterality: Right Cleanser Wound Cleanser Discharge Instruction: Cleanse the wound with wound cleanser prior to applying [name] clean dressing using gauze sponges, not tissue or cotton balls. Peri-Wound Care Skin Prep Discharge Instruction: Use skin prep as directed Topical Primary Dressing Grafix #10 Discharge Instruction: applied by Jeremiah Curci. adaptic and steri-strips Discharge Instruction: to secure grafix to wound bed. leave in place. Secondary Dressing ABD Pad, 8x10 Discharge  Instruction: Apply over primary dressing as directed. Secured With [employer], 4.5x3.1 (in/yd) Discharge Instruction: Secure with Kerlix as directed. 22M Medipore H Soft Cloth Surgical T ape, 4 x 10 (in/yd) Discharge Instruction: Secure with tape as directed. Compression Wrap Compression Stockings Add-Ons Electronic Signature(s) Signed: 08/14/2022 1:13:01 PM By: [name] Entered By: [name] on 08/13/2022 10:52:01 [name] ([id number]) [id number] Page 10 of 10 -------------------------------------------------------------------------------- Vitals Details Patient Name: Date of Service: [name], [name]. 08/13/2022 10:15 [name] Medical Record Number: [id number] Patient Account Number: [id number] Date of Birth/Sex: Treating RN: 03-Apr-1956 (67 y.o. M) Primary Care Rameen Gohlke: [name] Other Clinician: Referring Byron Peacock: Treating Piper Albro/Extender: [name], [name] in Treatment: 17 Vital Signs Time Taken: 10:35 Temperature (F): 97.7 Height (in): 72 Pulse (bpm): 66 Weight (lbs): 220 Respiratory  Rate (breaths/min): 17 Body Mass Index (BMI): 29.8 Blood Pressure (mmHg): 151/76 Capillary Blood Glucose (mg/dl): 59 Reference Range: 80 - 120 mg / dl Electronic Signature(s) Signed: 08/14/2022 1:13:01 PM By: [name] Entered By: [name] on 08/13/2022 10:36:13

## 2022-08-14 NOTE — Progress Notes (Addendum)
[name] ([id number]) [id number] Page 1 of 2 Visit Report for 08/14/2022 Arrival Information Details Patient Name: Date of Service: [name], [name]. 08/14/2022 8:00 [name] Medical Record Number: [id number] Patient Account Number: [id number] Date of Birth/Sex: Treating RN: Sep 11, 1955 (67 y.o. [name] Primary Care [name] Other Clinician: [name] Referring Obadiah Dennard: Treating Titilayo Hagans/Extender: [name] in Treatment: 94 Visit Information History Since Last Visit All ordered tests and consults were completed: Yes Patient Arrived: Wheel Chair Added or deleted any medications: No Arrival Time: 07:30 Any new allergies or adverse reactions: No Accompanied By: self Had [name] fall or experienced change in No Transfer Assistance: None activities of daily living that may affect Patient Identification Verified: Yes risk of falls: Secondary Verification Process Completed: Yes Signs or symptoms of abuse/neglect since last visito No Patient Requires Transmission-Based Precautions: No Hospitalized since last visit: No Patient Has Alerts: Yes Implantable device outside of the clinic excluding No cellular tissue based products placed in the center since last visit: Pain Present Now: No Electronic Signature(s) Signed: 08/14/2022 9:48:00 AM By: [name] CHT EMT BS , , Entered By: [name] on 08/14/2022 09:48:00 -------------------------------------------------------------------------------- Encounter Discharge Information Details Patient Name: Date of Service: [name], [name]. 08/14/2022 8:00 [name] Medical Record Number: [id number] Patient Account Number: [id number] Date of Birth/Sex: Treating RN: [birth year] (67 y.o. [name] Primary Care Tiny Rietz: [name] Other Clinician: [name] Referring Arshawn Valdez: Treating Shourya Macpherson/Extender: [name] in Treatment: 17 Encounter Discharge Information Items Discharge Condition: Stable Ambulatory Status: Wheelchair Discharge Destination: Home Transportation: Private Auto Accompanied By: self Schedule Follow-up Appointment: No Clinical Summary of Care: Electronic Signature(s) Signed: 08/14/2022 12:47:20 PM By: [name] CHT EMT BS , , Entered By: [name] on 08/14/2022 12:47:20 [name] ([id number] Page 2 of 2 -------------------------------------------------------------------------------- Vitals Details Patient Name: Date of Service: [name], [name]. 08/14/2022 8:00 [name] Medical Record Number: [id number] Patient Account Number: [id number] Date of Birth/Sex: Treating RN: [birth year] (67 y.o. [name] Primary Care Melanye Hiraldo: [name] Other Clinician: [name] Referring Samra Pesch: Treating Skylan Gift/Extender: [name] in Treatment: 17 Vital Signs Time Taken: 08:05 Temperature (F): 98.1 Height (in): 72 Pulse (bpm): 99 Weight (lbs): 220 Respiratory Rate (breaths/min): 18 Body Mass Index (BMI): 29.8 Blood Pressure (mmHg): 144/81 Capillary Blood Glucose (mg/dl): 99 Reference Range: 80 - 120 mg / dl Electronic Signature(s) Signed: 08/14/2022 9:48:24 AM By: [name] CHT EMT BS , , Entered By: [name] on 08/14/2022 09:48:24

## 2022-08-14 NOTE — Progress Notes (Addendum)
DOUGLES, TILLERSON A ([id number]) [id number] Page 1 of 2 Visit Report for 08/14/2022 HBO Details Patient Name: Date of Service: [name], [name] 08/14/2022 8:00 A M Medical Record Number: [id number] Patient Account Number: [id number] Date of Birth/Sex: Treating RN: January 11, 1956 (67 y.o. [name] Primary Care Dulcinea Kinser: [name] Other Clinician: [name] Referring Waverley Krempasky: Treating Holley Kocurek/Extender: [name] in Treatment: 17 HBO Treatment Course Details Treatment Course Number: 1 Ordering Purva Vessell: [name] T Treatments Ordered: otal 80 HBO Treatment Start Date: 06/01/2022 HBO Indication: Diabetic Ulcer(s) of the Lower Extremity Standard/Conservative Wound Care tried and failed greater than or equal to 30 days HBO Treatment Details Treatment Number: 45 Patient Type: Outpatient Chamber Type: Monoplace Chamber Serial #: [id number] Treatment Protocol: 2.5 ATA with 90 minutes oxygen, with two 5 minute air breaks Treatment Details Compression Rate Down: 1.5 psi / minute De-Compression Rate Up: 2.0 psi / minute A breaks and breathing ir Compress Tx Pressure periods Decompress Decompress Begins Reached (leave unused spaces Begins Ends blank) Chamber Pressure (ATA 1 2.5 2.5 2.5 2.5 2.5 - - 2.5 1 ) Clock Time (24 hr) 08:25 08:42 09:12 09:17 09:47 09:53 - - 10:22 10:36 Treatment Length: 131 (minutes) Treatment Segments: 4 Vital Signs Capillary Blood Glucose Reference Range: 80 - 120 mg / dl HBO Diabetic Blood Glucose Intervention Range: <131 mg/dl or >249 mg/dl Type: Time Vitals Blood Pulse: Respiratory Temperature: Capillary Blood Glucose Pulse Action Taken: Pressure: Rate: Glucose (mg/dl): Meter #: Oximetry (%) Taken: Pre 08:05 144/81 99 18 98.1 99 1 Patient given 8 oz Glucerna Shake, 2 apple juice Post 10:39 167/91 74 18 97.3 77 1 Treatment Response Treatment Toleration: Well Treatment Completion  Status: Treatment Completed without Adverse Event Treatment Notes Patient arrived for treatment, blood glucose level checked with result of 99 mg/dL. Mr. Droessler stated that he had eaten [place] sausage and egg with cheese on english muffin and orange juice on the way to treatment today. Patient was given 8 oz Glucerna per protocol and 2 apple juice (4 oz) per Dr. Heber Dobson. Patient prepared for treatment, self-administered Afrin, and then blood glucose level was re-checked with a result of 118 mg/dL [id number]). Patient tolerated treatment and decompression of the chamber at 2 psi/min. Post-treatment glucose level was 77 mg/dL. Per protocol patient was given 8 oz Glucerna and prepared for discharge. Dr. Heber Wells was made aware. Patient denied any symptoms of hypoglycemia and appeared asymptomatic of hypoglycemia as well. Patient was stable upon discharge. Additional Procedure Documentation Tissue Sevierity: Necrosis of bone Physician HBO Attestation: I certify that I supervised this HBO treatment in accordance with Medicare guidelines. A trained emergency response team is readily available per Yes hospital policies and procedures. Continue HBOT as ordered. Yes Electronic Signature(s) Signed: 08/17/2022 3:45:51 PM By: Elenor Legato [phone] ByKalman Shan DO [id number] Page 2 of 2 Signed: 08/17/2022 3:45:51 Previous Signature: 08/14/2022 12:57:04 PM Version By: [name] CHT EMT BS , , Previous Signature: 08/17/2022 9:31:00 AM Version By: [name] DO Previous Signature: 08/14/2022 [id number] PM Version By: [name] CHT EMT BS , , Previous Signature: 08/14/2022 12:45:49 PM Version By: [name] CHT EMT BS , , Previous Signature: 08/14/2022 9:56:45 AM Version By: [name] CHT EMT BS , , Previous Signature: 08/14/2022 12:26:02 PM Version By: [name] DO Entered By: [name] on 08/17/2022  13:19:23 -------------------------------------------------------------------------------- HBO Safety Checklist Details Patient Name: Date of Service: [name], [name] 08/14/2022 8:00 A M  Medical Record Number: [id number] Patient Account Number: [id number] Date of Birth/Sex: Treating RN: June 07, 1956 (67 y.o. [name] Primary Care Eeva Schlosser: [name] Other Clinician: [name] Referring Rayfield Beem: Treating Brealyn Baril/Extender: [name] in Treatment: 17 HBO Safety Checklist Items Safety Checklist Consent Form Signed Patient voided / foley secured and emptied When did you last eato 0720 Last dose of injectable or oral agent 0635 Ostomy pouch emptied and vented if applicable NA All implantable devices assessed, documented and approved NA Intravenous access site secured and place NA Valuables secured Linens and cotton and cotton/polyester blend (less than 51% polyester) Personal oil-based products / skin lotions / body lotions removed Wigs or hairpieces removed NA Smoking or tobacco materials removed NA Books / newspapers / magazines / loose paper removed Cologne, aftershave, perfume and deodorant removed Jewelry removed (may wrap wedding band) Make-up removed NA Hair care products removed NA Battery operated devices (external) removed Heating patches and chemical warmers removed Titanium eyewear removed Nail polish cured greater than 10 hours NA Casting material cured greater than 10 hours Hearing aids removed Loose dentures or partials removed Prosthetics have been removed Patient demonstrates correct use of air break device (if applicable) NA Patient concerns have been addressed NA Patient grounding bracelet on and cord attached to chamber NA Specifics for Inpatients (complete in addition to above) Medication sheet sent with patient NA Intravenous medications needed or due during therapy sent with  patient NA Drainage tubes (e.g. nasogastric tube or chest tube secured and vented) NA Endotracheal or Tracheotomy tube secured NA Cuff deflated of air and inflated with saline NA Airway suctioned NA Notes Paper version used prior to treatment. Electronic Signature(s) Signed: 08/14/2022 9:55:08 AM By: [name] CHT EMT BS , , Entered By: [name] on 08/14/2022 09:55:08

## 2022-08-14 NOTE — Progress Notes (Signed)
[name], [name] A ([id number]) [id number] Page 1 of 15 Visit Report for 08/13/2022 Chief Complaint Document Details Patient Name: Date of Service: [name], [name] 08/13/2022 10:15 A M Medical Record Number: [id number] Patient Account Number: [id number] Date of Birth/Sex: Treating RN: 01/23/1956 (67 y.o. M) Primary Care Provider: [name] Other Clinician: Referring Provider: Treating Provider/Extender: [name], [name] in Treatment: 17 Information Obtained from: Patient Chief Complaint 10/16/20; patient is here with a substantial wound on his right foot in the setting of a recent transmetatarsal amputation. 04/14/2022; referral from podiatry for postsurgical wound of nonhealing partial left fifth ray amputation, right foot (07/16/22): TMA dehiscence Electronic Signature(s) Signed: 08/13/2022 3:04:17 PM By: [name] DO Entered By: [name] on 08/13/2022 11:33:05 -------------------------------------------------------------------------------- Cellular or Tissue Based Product Details Patient Name: Date of Service: [name], [name] 08/13/2022 10:15 A M Medical Record Number: [id number] Patient Account Number: [id number] Date of Birth/Sex: Treating RN: 09/04/1955 (67 y.o. [name] Primary Care Provider: [name] Other Clinician: Referring Provider: Treating Provider/Extender: [name] in Treatment: 17 Cellular or Tissue Based Product Type Wound #6 Right Amputation Site - Transmetatarsal Applied to: Performed By: Physician [name], DO Cellular or Tissue Based Product Type: Grafix prime Level of Consciousness (Pre-procedure): Awake and Alert Pre-procedure Verification/Time Out Yes - 10:25 Taken: Location: genitalia / hands / feet / multiple digits Wound Size (sq cm): 0.08 Product Size (sq cm): 2 Waste Size (sq cm): 0 Amount of Product Applied (sq cm): 2 Instrument  Used: Forceps, Scissors Lot #: [id number] Order #: 10 Expiration Date: 11/18/2023 Fenestrated: No Reconstituted: Yes Solution Type: normal saline Solution Amount: 10 mL Lot #: [id number] Solution Expiration Date: 12/03/2024 Secured: Yes Secured With: Steri-Strips, adaptic Dressing Applied: No Procedural Pain: 0 Post Procedural Pain: 0 Response to Treatment: Procedure was tolerated well Level of Consciousness (Post- Awake and Alert YONAEL, TILBURY A ([id number]) [id number] Page 2 of 15 procedure): Post Procedure Diagnosis Same as Pre-procedure Electronic Signature(s) Signed: 08/13/2022 3:04:17 PM By: [name] DO Signed: 08/13/2022 5:24:12 PM By: [name] RN, [name] Entered By: [name] on 08/13/2022 11:26:00 -------------------------------------------------------------------------------- Cellular or Tissue Based Product Details Patient Name: Date of Service: [name], [name] 08/13/2022 10:15 A M Medical Record Number: [id number] Patient Account Number: [id number] Date of Birth/Sex: Treating RN: 1956-04-15 (67 y.o. [name] Primary Care Provider: [name] Other Clinician: Referring Provider: Treating Provider/Extender: [name] in Treatment: 17 Cellular or Tissue Based Product Type Wound #5 Left,Lateral Foot Applied to: Performed By: Physician [name], DO Cellular or Tissue Based Product Type: Grafix prime Level of Consciousness (Pre-procedure): Awake and Alert Pre-procedure Verification/Time Out Yes - 10:25 Taken: Location: genitalia / hands / feet / multiple digits Wound Size (sq cm): 2.7 Product Size (sq cm): 4 Waste Size (sq cm): 2 Waste Reason: wound size Amount of Product Applied (sq cm): 2 Instrument Used: Forceps, Scissors Lot #: [id number] Order #: 10 Expiration Date: 11/18/2023 Fenestrated: No Reconstituted: Yes Solution Type: normal saline Solution Amount: 10 mL Lot  #: [id number] Solution Expiration Date: 12/03/2024 Secured: Yes Secured With: Steri-Strips, adaptic Dressing Applied: No Procedural Pain: 0 Post Procedural Pain: 0 Response to Treatment: Procedure was tolerated well Level of Consciousness (Post- Awake and Alert procedure): Post Procedure Diagnosis Same as Pre-procedure Electronic Signature(s) Signed: 08/13/2022 3:04:17 PM By: [name] DO Signed: 08/13/2022 5:24:12 PM By: [name] RN, [name] Entered By: [name] on 08/13/2022 11:26:25  [name], [name] A ([id number]) [id number] Page 3 of 15 -------------------------------------------------------------------------------- Debridement Details Patient Name: Date of Service: [name], [name] 08/13/2022 10:15 A M Medical Record Number: [id number] Patient Account Number: [id number] Date of Birth/Sex: Treating RN: 12/29/1955 (67 y.o. [name], [name] Primary Care Provider: [name] Other Clinician: Referring Provider: Treating Provider/Extender: [name] in Treatment: 17 Debridement Performed for Assessment: Wound #6 Right Amputation Site - Transmetatarsal Performed By: Physician [name], DO Debridement Type: Debridement Severity of Tissue Pre Debridement: Fat layer exposed Level of Consciousness (Pre-procedure): Awake and Alert Pre-procedure Verification/Time Out Yes - 11:10 Taken: Start Time: 11:11 Pain Control: Lidocaine 4% T opical Solution T Area Debrided (L x W): otal 2 (cm) x 2 (cm) = 4 (cm) Tissue and other material debrided: Viable, Non-Viable, Callus, Slough, Subcutaneous, Skin: Dermis , Skin: Epidermis, Slough Level: Skin/Subcutaneous Tissue Debridement Description: Excisional Instrument: Curette Bleeding: Minimum Hemostasis Achieved: Pressure End Time: 11:16 Procedural Pain: 0 Post Procedural Pain: 0 Response to Treatment: Procedure was tolerated well Level of Consciousness (Post- Awake  and Alert procedure): Post Debridement Measurements of Total Wound Length: (cm) 0 Width: (cm) 0.2 Depth: (cm) 0.5 Volume: (cm) 0 Character of Wound/Ulcer Post Debridement: Improved Severity of Tissue Post Debridement: Fat layer exposed Post Procedure Diagnosis Same as Pre-procedure Electronic Signature(s) Signed: 08/13/2022 3:04:17 PM By: [name] DO Signed: 08/13/2022 5:24:12 PM By: [name] RN, [name] Entered By: [name] on 08/13/2022 11:17:46 -------------------------------------------------------------------------------- Debridement Details Patient Name: Date of Service: [name], [name] 08/13/2022 10:15 A M Medical Record Number: [id number] Patient Account Number: [id number] Date of Birth/Sex: Treating RN: 03/25/1956 (67 y.o. [name] Primary Care Provider: [name] Other Clinician: Referring Provider: Treating Provider/Extender: [name] in Treatment: 17 Debridement Performed for Assessment: Wound #5 Left,Lateral Foot Performed By: Physician [name], DO Debridement Type: Debridement Severity of Tissue Pre Debridement: Fat layer exposed Level of Consciousness (Pre-procedure): Awake and Alert Pre-procedure Verification/Time Out Yes - 11:10 Taken: Start Time: 11:11 Pain Control: Lidocaine 4% T opical Solution T Area Debrided (L x W): otal 4.5 (cm) x 0.6 (cm) = 2.7 (cm) Tissue and other material debrided: Viable, Non-Viable, Callus, Slough, Subcutaneous, Skin: Dermis , Skin: Epidermis, Slough Level: Skin/Subcutaneous Tissue MEARL, KELLOM A ([id number]) [id number] Page 4 of 15 Debridement Description: Excisional Instrument: Curette Bleeding: Minimum Hemostasis Achieved: Pressure End Time: 11:16 Procedural Pain: 0 Post Procedural Pain: 0 Response to Treatment: Procedure was tolerated well Level of Consciousness (Post- Awake and Alert procedure): Post Debridement  Measurements of Total Wound Length: (cm) 4.5 Width: (cm) 0.6 Depth: (cm) 0.3 Volume: (cm) 0.636 Character of Wound/Ulcer Post Debridement: Improved Severity of Tissue Post Debridement: Fat layer exposed Post Procedure Diagnosis Same as Pre-procedure Electronic Signature(s) Signed: 08/13/2022 3:04:17 PM By: [name] DO Signed: 08/13/2022 5:24:12 PM By: [name] RN, [name] Entered By: [name] on 08/13/2022 11:23:36 -------------------------------------------------------------------------------- HPI Details Patient Name: Date of Service: [name], [name] 08/13/2022 10:15 A M Medical Record Number: [id number] Patient Account Number: [id number] Date of Birth/Sex: Treating RN: 1956-04-11 (67 y.o. M) Primary Care Provider: [name] Other Clinician: Referring Provider: Treating Provider/Extender: [name], [name] in Treatment: 17 History of Present Illness HPI Description: ADMISSION 10/16/20 This is a 67 year old man who is a type II diabetic. Initially seen by Dr. Unk Lightning of vein and vascular on 09/10/2021 for bilateral lower extremity rest pain right greater than left. His ABIs demonstrated monophasic waveforms at the  ankles bilaterally with depressed toe pressures. His ABI in the right was 0.5 and on the left 0.28. There were no obtainable waveforms for TBI's. He underwent angiography on 09/10/2021 and had findings of severe PAD with 95% stenosis of the distal SFA. He also had peroneal and posterior tibial arteries occluded with significant collateralization in the calf there was reconstitution of the posterior tibial artery in the distal third of the calf. Ostia of the anterior tibial artery was not appreciated. He underwent a angioplasty of the superficial femoral artery. He required admission to hospital from 09/12/2021 through 09/22/2021 with right foot cellulitis and sepsis. On 3/13 he underwent a right below-knee popliteal to posterior tibial bypass  using a greater saphenous vein. 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 He was discharged to [facility] skilled facility. He arrives in clinic today with a large wound area over the entirety of his amputation site extending proximally. The attempted flap closure of the amputation site and his TMA has failed and the wound extends under the attempt to closure. Still some sutures in place. There is an odor but he is not systemically unwell. He is due for follow-up arterial studies and has an appointment with vascular surgery on 10/28/2021. They are using wet-to-dry dressings at [facility]. Past medical history includes type 2 diabetes with peripheral neuropathy, hypertension, obstructive sleep apnea 4/20; patient presents for follow-up. He is being discharged from his facility at [facility] today. They have been doing Dakin's wet-to-dry dressings. He states that his fiance can help with dressing changes. He is getting [facility] home [facility] to come out 3 times a week once he leaves the facility. He is scheduled to see vein and vascular on 5/12. He has not seen the wound himself. He puts covers over his face for wound exams. He currently denies systemic signs of infection. 4/28; patient admitted to the clinic 2 weeks ago with a dehisced right TMA in the setting of type 2 diabetes with significant PAD. He is status post revascularization. He has been discharged from the nursing home he was at and now is at home using Dakin's wet-to-dry dressings daily he has home health. He denies any systemic signs of infection 5/4; patient presents for follow-up. His fiance and home health have been changing his dressings. He currently denies systemic signs of infection. He continues to put sheet covers over his face during our wound encounters because he does not want a look at the wound. 5/25; Patient presents for follow-up. He missed his  last clinic appointment. He had a bone biopsy and culture done at last clinic visit that showed acute osteomyelitis with growth of E. coli, stenotrophomonas maltophilia and enteroccocus faecalis. A referral to infectious disease was made. He has not heard from them yet. He has been using Dakin's wet-to-dry dressings. He is now more comfortable with looking at the wound bed. BRADIE, IAQUINTO A ([id number]) [id number] Page 5 of 15 6/6; the patient comes into clinic today with 2 wounds on the left foot which apparently have been there for several months. This was not known to assess but was known by Dr. Donzetta Matters. In fact he underwent an angiogram yesterday. He had a laser atherectomy of the left posterior tibial artery with a 3 mm balloon angioplasty, also a stent of the last left SFA. 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 This is a lot better than the last time I saw it. He has been using Dakin's wet-to-dry. He has an appointment with infectious disease for a bone biopsy which showed E. coli, stenotrophomonas and Enterococcus faecalis. The appointment is for tomorrow 04/14/2022 Mr. [place] Ottmar ajuddin is an uncontrolled type 2 diabetic on insulin that presents with a left foot ulcer. On 12/18/2021 he required a left fifth ray amputation. He had revision of this site on 6/17 by Dr. Posey Pronto, podiatry. He states he has had a wound VAC on this area for the past 3 weeks. It is unclear what the prior wound care has been. He has a history of osteomyelitis to this area and was treated with IV and oral antibiotics For 6 weeks by Dr. Linus Salmons of infectious disease. At his last follow-up on 02/20/2022 his CRP was trending down and antibiotics were stopped. He also has a history of peripheral arterial disease status post stenting to the  left SFA and balloon angioplasty to the left posterior tibial artery On 12/2021. He has follow-up with vein and vascular at the end of the month. He also has a wound to the dorsal aspect of the left foot that he has been placing Betadine on. 10/19; patient presents for follow-up. He had a bone biopsy of the left foot at last clinic visit showed osteomyelitis. The bone culture showed Enterobacter cloacae, Enterococcus faecalis, Staphylococcus aureus, viridans group strep, actinotigum schaelii. He has been referred to infectious disease And has an appointment on October 25. He has been using Dakin's wet-to-dry dressing to the lateral left foot wound and Hydrofera Blue and Medihoney to the dorsal foot wound. He has no issues or complaints today. He denies systemic signs of infection. 10/26; patient presents for follow-up. He saw Dr. Linus Salmons yesterday with infectious disease and has been prescribed doxycycline and Levaquin for his chronic osteomyelitis. He has been using Dakin's wet-to-dry dressings to the lateral left foot wound and Medihoney and Hydrofera Blue to the dorsal wound. We discussed a skin graft for the dorsal foot wound and he would like to proceed with insurance verification. 11/3; patient with a wound on the dorsal left foot and a substantial postsurgical area on the lateral left foot fifth ray amputation. He is also followed by Dr. Alton Revere of infectious disease. He had a bone biopsy from the left lateral foot that showed acute osteomyelitis. Swab culture showed multiple organisms. He has also been revascularized by infectious disease status post left SFA stenting and tibial laser atherectomy and angioplasty. An MRI of the left foot had previously shown osteomyelitis of the fifth met head we have been using Dakin's wet-to-dry to the left lateral foot and Medihoney and Hydrofera Blue to the wound on the dorsal foot. 11/13; HBO treatment was discussed at last clinic visit and patient would like to  proceed with this. He has orientation today. He has been approved for Grafix. We have been using Hydrofera Blue and Medihoney to the left dorsal foot and collagen to the left lateral foot. Patient has home health that comes in for dressing changes. He currently has no issues or complaints today. 12/7; patient missed his last clinic appointment. He was last seen 3 weeks ago. He is currently off of antibiotics per ID. He completed a prolonged treatment. He has been using collagen to the lateral foot wound. He has been using Medihoney and Hydrofera Blue to  the dorsal foot wound. He has been doing well with hyperbaric oxygen therapy. He has no issues or complaints today. He denies signs of of infection. 12/14; patient presents for follow-up. We have been using Grafix to the wound beds. He has had trouble equalizing the pressure in his ears during HBO treatments. It was recommended he follow-up with ENT He does not have an appointment yet. He currently denies signs of infection. . 12/21; patient presents for follow-up. He was seen by ENT and had tubes placed. He is continued HBO without issues. We have been using Grafix to the wound beds. There is been improvement in wound healing. 12/21; patient presents for follow-up. He has no issues or complaints today. There is been improvement in wound healing. 1/5; patient presents for follow-up. At last clinic visit Grafix was placed in standard fashion. He has no issues or complaints today. He is tolerating hyperbaric oxygen therapy well. He denies signs of infection. 1/11; patient presents for follow-up. We have been placing Grafix to the wound beds on the left foot. The dorsal left foot wound has healed. The left lateral foot wound has shown improvement in healing. Unfortunately has had dehiscence of his previous surgical wound site on the right foot. He currently denies signs of infection. 1/19; patient presents for follow-up. Grafix was placed to the left  foot wound at last clinic visit. He has been using Dakin's wet-to-dry dressings to the right foot wound. He continues HBO therapy without issues. He has shown improvement in wound healing to the left foot. 1/26; patient presents for follow-up. We been placing Grafix to the left foot wound and patient has been using Dakin's wet-to-dry dressings to the right foot wound. There is been improvement in wound healing. He continues HBO therapy without issues. 2/2; patient presents for follow-up. We have been using Grafix to the left foot wound and Dakin's wet-to-dry dressings to the right foot wound. He has no issues or complaints today. 2/8; patient presents for follow-up. We have been using Grafix to the left foot wound and Dakin's wet-to-dry dressings to the right foot wound. He has no issues or complaints today. He continues HBO without issues. Electronic Signature(s) Signed: 08/13/2022 3:04:17 PM By: [name] DO Entered By: [name] on 08/13/2022 11:33:35 -------------------------------------------------------------------------------- Physical Exam Details Patient Name: Date of Service: [name], [name] 08/13/2022 10:15 A M Medical Record Number: [id number] Patient Account Number: [id number] Date of Birth/Sex: Treating RN: January 12, 1956 (67 y.o. M) Primary Care Provider: [name] Other Clinician: Referring Provider: Treating Provider/Extender: [name], [name] in Treatment: [address] A ([id number]) [id number] Page 6 of 15 Constitutional respirations regular, non-labored and within target range for patient.. Cardiovascular 2+ dorsalis pedis/posterior tibialis pulses. Psychiatric pleasant and cooperative. Notes Left foot: T the lateral aspect there is an open wound with granulation tissue and nonviable tissue. No probing to bone . Right foot: Along the previous TMA o site there is an open wound with nonviable  tissue, granulation tissue and callus. There is increased depth In the center. Does not probe to bone. No signs of surrounding soft tissue infection. Electronic Signature(s) Signed: 08/13/2022 3:04:17 PM By: [name] DO Entered By: [name] on 08/13/2022 11:34:30 -------------------------------------------------------------------------------- Physician Orders Details Patient Name: Date of Service: [name], [name] 08/13/2022 10:15 A M Medical Record Number: [id number] Patient Account Number: [id number] Date of Birth/Sex: Treating RN: 06/08/1956 (67 y.o. [name] Primary Care Provider: [name] Other Clinician: Referring Provider:  Treating Provider/Extender: [name], [name] in Treatment: 17 Verbal / Phone Orders: No Diagnosis Coding ICD-10 Coding Code Description [phone] Non-pressure chronic ulcer of other part of left foot with fat layer exposed M86.172 Other acute osteomyelitis, left ankle and foot L97.528 Non-pressure chronic ulcer of other part of left foot with other specified severity E11.621 Type 2 diabetes mellitus with foot ulcer E11.42 Type 2 diabetes mellitus with diabetic polyneuropathy S91.301A Unspecified open wound, right foot, initial encounter 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 severity Follow-up Appointments ppointment in 1 week. - Dr. Heber [facility] Thursday 1015 08/20/2022 Return A ppointment in 2 weeks. - w/ Dr. Heber  Thursday 08/27/2022 1015 overflow Return A Other: - ****ONLY CHANGE OUTTER DRESSINGS ONLY.*** Anesthetic (In clinic) Topical Lidocaine 5% applied to wound bed Cellular or Tissue Based Products Cellular or Tissue Based Product Type: - Run IVR for Grafix for left dorsal foot- pending Grafix # 1 applied 06/11/22 Grafix # 2 applied 06/18/22 Grafix # 3 applied 06/25/22 GRafix # 4 applied 07/02/22 Grafix # 5 applied 07/10/22 Grafix # 6 applied  07/17/22 Grafix # 7 applied 07/24/22---Also, go ahead and run IVR for more Grafix for left lateral foot Grafix #8 applied 07/31/2022 Grafix # 9 applied 08/07/22] left foot Grafix #10 applied 08/13/2022 left and right foot wounds today split the grafix in half. Cellular or Tissue Based Product applied to wound bed, secured with steri-strips, cover with Adaptic or Mepitel. (DO NOT REMOVE). Edema Control - Lymphedema / SCD / Other Avoid standing for long periods of time. Off-Loading RYLEND, BATTENFIELD A ([id number]) [phone].pdf Page 7 of 15 Open toe surgical shoe to: - wear when standing or walking. Hyperbaric Oxygen Therapy Evaluate for HBO Therapy - 07/24/22- Pt. to finish next 8 HBO treatments 07/24/22- DR. Aristotelis Vilardi wants pt. to be extended for 40 more treatments Indication: - Wagner Grade III to left lateral foot 2.5 ATA for 90 Minutes with 2 Five (5) Minute A Breaks ir Total Number of Treatments: - 40; extend for 40 more= 80 treatments One treatments per day (delivered Monday through Friday unless otherwise specified in Special Instructions below): Finger stick Blood Glucose Pre- and Post- HBOT Treatment. Follow Hyperbaric Oxygen Glycemia Protocol Afrin (Oxymetazoline HCL) 0.05% nasal spray - 1 spray in both nostrils daily as needed prior to HBO treatment for difficulty clearing ears Wound Treatment Wound #5 - Foot Wound Laterality: Left, Lateral Cleanser: Wound Cleanser (Generic) 1 x Per Day/30 Days Discharge Instructions: Cleanse the wound with wound cleanser prior to applying a clean dressing using gauze sponges, not tissue or cotton balls. Peri-Wound Care: Skin Prep (Generic) 1 x Per Day/30 Days Discharge Instructions: Use skin prep as directed Prim Dressing: Grafix #10 (Generic) 1 x Per Day/30 Days ary Discharge Instructions: applied by provider. Prim Dressing: adaptic and steri-strips 1 x Per Day/30 Days ary Discharge Instructions: to secure grafix to  wound bed. leave in place. Secondary Dressing: ABD Pad, 8x10 (Generic) 1 x Per Day/30 Days Discharge Instructions: Apply over primary dressing as directed. Secured With: [employer], 4.5x3.1 (in/yd) (Generic) 1 x Per Day/30 Days Discharge Instructions: Secure with Kerlix as directed. Secured With: 30M Medipore H Soft Cloth Surgical T ape, 4 x 10 (in/yd) (Generic) 1 x Per Day/30 Days Discharge Instructions: Secure with tape as directed. Wound #6 - Amputation Site - Transmetatarsal Wound Laterality: Right Cleanser: Wound Cleanser (Generic) 1 x Per Day/30 Days Discharge Instructions: Cleanse the wound with wound cleanser prior to applying a clean  dressing using gauze sponges, not tissue or cotton balls. Peri-Wound Care: Skin Prep (Generic) 1 x Per Day/30 Days Discharge Instructions: Use skin prep as directed Prim Dressing: Grafix #10 (Generic) 1 x Per Day/30 Days ary Discharge Instructions: applied by provider. Prim Dressing: adaptic and steri-strips 1 x Per Day/30 Days ary Discharge Instructions: to secure grafix to wound bed. leave in place. Secondary Dressing: ABD Pad, 8x10 (Generic) 1 x Per Day/30 Days Discharge Instructions: Apply over primary dressing as directed. Secured With: [employer], 4.5x3.1 (in/yd) (Generic) 1 x Per Day/30 Days Discharge Instructions: Secure with Kerlix as directed. Secured With: 40M Medipore H Soft Cloth Surgical T ape, 4 x 10 (in/yd) (Generic) 1 x Per Day/30 Days Discharge Instructions: Secure with tape as directed. GLYCEMIA INTERVENTIONS PROTOCOL PRE-HBO GLYCEMIA INTERVENTIONS ACTION INTERVENTION Obtain pre-HBO capillary blood glucose (ensure 1 physician order is in chart). A. Notify HBO physician and await physician orders. 2 If result is 70 mg/dl or below: B. If the result meets the hospital definition of a critical result, follow hospital policy. A. Give patient an 8 ounce Glucerna Shake, an 8 ounce Ensure, or 8 ounces of  a Glucerna/Ensure equivalent dietary supplement*. B. Wait 30 minutes. If result is 71 mg/dl to 130 mg/dl: C. Retest patients capillary blood glucose (CBG). D. If result greater than or equal to 110 mg/dl, proceed with HBO. If result less than 110 mg/dl, notify HBO physician and consider holding HBO. If result is 131 mg/dl to 249 mg/dl: A. Proceed with HBO. TRA, ASSENMACHER A ([id number]) [id number] Page 8 of 15 A. Notify HBO physician and await physician orders. B. It is recommended to hold HBO and do If result is 250 mg/dl or greater: blood/urine ketone testing. C. If the result meets the hospital definition of a critical result, follow hospital policy. POST-HBO GLYCEMIA INTERVENTIONS ACTION INTERVENTION Obtain post HBO capillary blood glucose (ensure 1 physician order is in chart). A. Notify HBO physician and await physician orders. 2 If result is 70 mg/dl or below: B. If the result meets the hospital definition of a critical result, follow hospital policy. A. Give patient an 8 ounce Glucerna Shake, an 8 ounce Ensure, or 8 ounces of a Glucerna/Ensure equivalent dietary supplement*. B. Wait 15 minutes for symptoms of If result is 71 mg/dl to 100 mg/dl: hypoglycemia (i.e. nervousness, anxiety, sweating, chills, clamminess, irritability, confusion, tachycardia or dizziness). C. If patient asymptomatic, discharge patient. If patient symptomatic, repeat capillary blood glucose (CBG) and notify HBO physician. If result is 101 mg/dl to 249 mg/dl: A. Discharge patient. A. Notify HBO physician and await physician orders. B. It is recommended to do blood/urine ketone If result is 250 mg/dl or greater: testing. C. If the result meets the hospital definition of a critical result, follow hospital policy. *Juice or candies are NOT equivalent products. If patient refuses the Glucerna or Ensure, please consult the hospital dietitian for an appropriate  substitute. Electronic Signature(s) Signed: 08/13/2022 3:04:17 PM By: [name] DO Entered By: [name] on 08/13/2022 11:34:41 -------------------------------------------------------------------------------- Problem List Details Patient Name: Date of Service: [name], [name] 08/13/2022 10:15 A M Medical Record Number: [id number] Patient Account Number: [id number] Date of Birth/Sex: Treating RN: [birth year] (67 y.o. [name] Primary Care Provider: [name] Other Clinician: Referring Provider: Treating Provider/Extender: [name] in Treatment: 17 Active Problems ICD-10 Encounter Code Description Active Date MDM Diagnosis [phone] Non-pressure chronic ulcer of other part of left foot with fat layer exposed 04/14/2022 No  Yes M86.172 Other acute osteomyelitis, left ankle and foot 05/08/2022 No Yes L97.528 Non-pressure chronic ulcer of other part of left foot with other specified 04/14/2022 No Yes severity E11.621 Type 2 diabetes mellitus with foot ulcer 04/14/2022 No Yes E11.42 Type 2 diabetes mellitus with diabetic polyneuropathy 04/14/2022 No Yes [name], [name] A ([id number]) [phone] Page 9 of 15 S91.301A Unspecified open wound, right foot, initial encounter 07/16/2022 No Yes 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 07/16/2022 No Yes severity Inactive Problems Resolved Problems Electronic Signature(s) Signed: 08/13/2022 3:04:17 PM By: [name] DO Entered By: [name] on 08/13/2022 11:32:47 -------------------------------------------------------------------------------- Progress Note Details Patient Name: Date of Service: [name], [name] 08/13/2022 10:15 A M Medical Record Number: [id number] Patient Account Number: [id number] Date of Birth/Sex: Treating RN: 06/24/1956 (67 y.o. M) Primary Care Provider: [name] Other  Clinician: Referring Provider: Treating Provider/Extender: [name], [name] in Treatment: 17 Subjective Chief Complaint Information obtained from Patient 10/16/20; patient is here with a substantial wound on his right foot in the setting of a recent transmetatarsal amputation. 04/14/2022; referral from podiatry for postsurgical wound of nonhealing partial left fifth ray amputation, right foot (07/16/22): TMA dehiscence History of Present Illness (HPI) ADMISSION 10/16/20 This is a 67 year old man who is a type II diabetic. Initially seen by Dr. Unk Lightning of vein and vascular on 09/10/2021 for bilateral lower extremity rest pain right greater than left. His ABIs demonstrated monophasic waveforms at the ankles bilaterally with depressed toe pressures. His ABI in the right was 0.5 and on the left 0.28. There were no obtainable waveforms for TBI's. He underwent angiography on 09/10/2021 and had findings of severe PAD with 95% stenosis of the distal SFA. He also had peroneal and posterior tibial arteries occluded with significant collateralization in the calf there was reconstitution of the posterior tibial artery in the distal third of the calf. Ostia of the anterior tibial artery was not appreciated. He underwent a angioplasty of the superficial femoral artery. He required admission to hospital from 09/12/2021 through 09/22/2021 with right foot cellulitis and sepsis. On 3/13 he underwent a right below-knee popliteal to posterior tibial bypass using a greater saphenous vein. 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 He was discharged to [facility] skilled facility. He arrives in clinic today with a large wound area over the entirety of his amputation site extending proximally. The attempted flap closure of the amputation site and his TMA has failed and the wound extends under the attempt to closure. Still  some sutures in place. There is an odor but he is not systemically unwell. He is due for follow-up arterial studies and has an appointment with vascular surgery on 10/28/2021. They are using wet-to-dry dressings at [facility]. Past medical history includes type 2 diabetes with peripheral neuropathy, hypertension, obstructive sleep apnea 4/20; patient presents for follow-up. He is being discharged from his facility at [facility] today. They have been doing Dakin's wet-to-dry dressings. He states that his fiance can help with dressing changes. He is getting [facility] home [facility] to come out 3 times a week once he leaves the facility. He is scheduled to see vein and vascular on 5/12. He has not seen the wound himself. He puts covers over his face for wound exams. He currently denies systemic signs of infection. 4/28; patient admitted to the clinic 2 weeks ago with a dehisced right TMA in  the setting of type 2 diabetes with significant PAD. He is status post revascularization. He has been discharged from the nursing home he was at and now is at home using Dakin's wet-to-dry dressings daily he has home health. He denies any systemic signs of infection 5/4; patient presents for follow-up. His fiance and home health have been changing his dressings. He currently denies systemic signs of infection. He continues to put sheet covers over his face during our wound encounters because he does not want a look at the wound. 5/25; Patient presents for follow-up. He missed his last clinic appointment. He had a bone biopsy and culture done at last clinic visit that showed acute osteomyelitis with growth of E. coli, stenotrophomonas maltophilia and enteroccocus faecalis. A referral to infectious disease was made. He has not heard from them yet. He has been using Dakin's wet-to-dry dressings. He is now more comfortable with looking at the wound bed. 6/6; the patient comes into clinic today with 2 wounds on the left foot  which apparently have been there for several months. This was not known to assess but was known by Dr. Donzetta Matters. In fact he underwent an angiogram yesterday. He had a laser atherectomy of the left posterior tibial artery with a 3 mm balloon KASEM, HAFELE A ([id number]) [phone].pdf Page 10 of 15 angioplasty, also a stent of the last left SFA. 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 This is a lot better than the last time I saw it. He has been using Dakin's wet-to-dry. He has an appointment with infectious disease for a bone biopsy which showed E. coli, stenotrophomonas and Enterococcus faecalis. The appointment is for tomorrow 04/14/2022 Mr. Toye Painter ajuddin is an uncontrolled type 2 diabetic on insulin that presents with a left foot ulcer. On 12/18/2021 he required a left fifth ray amputation. He had revision of this site on 6/17 by Dr. Posey Pronto, podiatry. He states he has had a wound VAC on this area for the past 3 weeks. It is unclear what the prior wound care has been. He has a history of osteomyelitis to this area and was treated with IV and oral antibiotics For 6 weeks by Dr. Linus Salmons of infectious disease. At his last follow-up on 02/20/2022 his CRP was trending down and antibiotics were stopped. He also has a history of peripheral arterial disease status post stenting to the left SFA and balloon angioplasty to the left posterior tibial artery On 12/2021. He has follow-up with vein and vascular at the end of the month. He also has a wound to the dorsal aspect of the left foot that he has been placing Betadine on. 10/19; patient presents for follow-up. He had a bone biopsy of the left foot at last clinic visit showed osteomyelitis. The bone culture showed Enterobacter cloacae, Enterococcus faecalis, Staphylococcus  aureus, viridans group strep, actinotigum schaelii. He has been referred to infectious disease And has an appointment on October 25. He has been using Dakin's wet-to-dry dressing to the lateral left foot wound and Hydrofera Blue and Medihoney to the dorsal foot wound. He has no issues or complaints today. He denies systemic signs of infection. 10/26; patient presents for follow-up. He saw Dr. Linus Salmons yesterday with infectious disease and has been prescribed doxycycline and Levaquin for his chronic osteomyelitis. He has been using Dakin's  wet-to-dry dressings to the lateral left foot wound and Medihoney and Hydrofera Blue to the dorsal wound. We discussed a skin graft for the dorsal foot wound and he would like to proceed with insurance verification. 11/3; patient with a wound on the dorsal left foot and a substantial postsurgical area on the lateral left foot fifth ray amputation. He is also followed by Dr. Alton Revere of infectious disease. He had a bone biopsy from the left lateral foot that showed acute osteomyelitis. Swab culture showed multiple organisms. He has also been revascularized by infectious disease status post left SFA stenting and tibial laser atherectomy and angioplasty. An MRI of the left foot had previously shown osteomyelitis of the fifth met head we have been using Dakin's wet-to-dry to the left lateral foot and Medihoney and Hydrofera Blue to the wound on the dorsal foot. 11/13; HBO treatment was discussed at last clinic visit and patient would like to proceed with this. He has orientation today. He has been approved for Grafix. We have been using Hydrofera Blue and Medihoney to the left dorsal foot and collagen to the left lateral foot. Patient has home health that comes in for dressing changes. He currently has no issues or complaints today. 12/7; patient missed his last clinic appointment. He was last seen 3 weeks ago. He is currently off of antibiotics per ID. He completed a prolonged  treatment. He has been using collagen to the lateral foot wound. He has been using Medihoney and Hydrofera Blue to the dorsal foot wound. He has been doing well with hyperbaric oxygen therapy. He has no issues or complaints today. He denies signs of of infection. 12/14; patient presents for follow-up. We have been using Grafix to the wound beds. He has had trouble equalizing the pressure in his ears during HBO treatments. It was recommended he follow-up with ENT He does not have an appointment yet. He currently denies signs of infection. . 12/21; patient presents for follow-up. He was seen by ENT and had tubes placed. He is continued HBO without issues. We have been using Grafix to the wound beds. There is been improvement in wound healing. 12/21; patient presents for follow-up. He has no issues or complaints today. There is been improvement in wound healing. 1/5; patient presents for follow-up. At last clinic visit Grafix was placed in standard fashion. He has no issues or complaints today. He is tolerating hyperbaric oxygen therapy well. He denies signs of infection. 1/11; patient presents for follow-up. We have been placing Grafix to the wound beds on the left foot. The dorsal left foot wound has healed. The left lateral foot wound has shown improvement in healing. Unfortunately has had dehiscence of his previous surgical wound site on the right foot. He currently denies signs of infection. 1/19; patient presents for follow-up. Grafix was placed to the left foot wound at last clinic visit. He has been using Dakin's wet-to-dry dressings to the right foot wound. He continues HBO therapy without issues. He has shown improvement in wound healing to the left foot. 1/26; patient presents for follow-up. We been placing Grafix to the left foot wound and patient has been using Dakin's wet-to-dry dressings to the right foot wound. There is been improvement in wound healing. He continues HBO therapy without  issues. 2/2; patient presents for follow-up. We have been using Grafix to the left foot wound and Dakin's wet-to-dry dressings to the right foot wound. He has no issues or complaints today. 2/8; patient presents for follow-up. We have  been using Grafix to the left foot wound and Dakin's wet-to-dry dressings to the right foot wound. He has no issues or complaints today. He continues HBO without issues. Patient History Information obtained from Patient, Chart. Family History Diabetes - Mother,Siblings, Heart Disease - Siblings, Hypertension - Siblings, Stroke - Mother, No family history of Cancer, Hereditary Spherocytosis, Kidney Disease, Lung Disease, Seizures, Thyroid Problems, Tuberculosis. Social History Former smoker, Marital Status - Single, Alcohol Use - Never, Drug Use - No History, Caffeine Use - Daily. Medical History Eyes Denies history of Cataracts, Glaucoma, Optic Neuritis Respiratory Patient has history of Asthma, Sleep Apnea Denies history of Aspiration, Chronic Obstructive Pulmonary Disease (COPD), Pneumothorax, Tuberculosis Cardiovascular Patient has history of Hypertension, Peripheral Venous Disease Denies history of Angina, Arrhythmia, Congestive Heart Failure, Coronary Artery Disease, Deep Vein Thrombosis, Hypotension, Myocardial Infarction, Peripheral Arterial Disease, Phlebitis, Vasculitis Endocrine Patient has history of Type II Diabetes Denies history of Type I Diabetes Integumentary (Skin) Denies history of History of Burn Musculoskeletal Patient has history of Osteomyelitis [name], [name] A ([id number]) [id number] Page 11 of 15 Denies history of Gout, Rheumatoid Arthritis, Osteoarthritis Neurologic Patient has history of Neuropathy Denies history of Dementia, Quadriplegia, Paraplegia, Seizure Disorder Hospitalization/Surgery History - 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 Posey Pronto. - aortogram 12/08/2021 Dr.  Virl Cagey [facility]. Medical A Surgical History Notes nd Psychiatric PTSD Objective Constitutional respirations regular, non-labored and within target range for patient.. Vitals Time Taken: 10:35 AM, Height: 72 in, Weight: 220 lbs, BMI: 29.8, Temperature: 97.7 F, Pulse: 66 bpm, Respiratory Rate: 17 breaths/min, Blood Pressure: 151/76 mmHg, Capillary Blood Glucose: 59 mg/dl. Cardiovascular 2+ dorsalis pedis/posterior tibialis pulses. Psychiatric pleasant and cooperative. General Notes: Left foot: T the lateral aspect there is an open wound with granulation tissue and nonviable tissue. No probing to bone . Right foot: Along the o previous TMA site there is an open wound with nonviable tissue, granulation tissue and callus. There is increased depth In the center. Does not probe to bone. No signs of surrounding soft tissue infection. Integumentary (Hair, Skin) Wound #5 status is Open. Original cause of wound was Surgical Injury. The date acquired was: 12/20/2021. The wound has been in treatment 17 weeks. The wound is located on the Left,Lateral Foot. The wound measures 4.5cm length x 0.6cm width x 0.4cm depth; 2.121cm^2 area and 0.848cm^3 volume. There is Fat Layer (Subcutaneous Tissue) exposed. There is no tunneling or undermining noted. There is a medium amount of serosanguineous drainage noted. The wound margin is epibole. There is large (67-100%) red, pink granulation within the wound bed. There is a small (1-33%) amount of necrotic tissue within the wound bed including Adherent Slough. The periwound skin appearance had no abnormalities noted for color. The periwound skin appearance exhibited: Callus. The periwound skin appearance did not exhibit: Crepitus, Excoriation, Induration, Rash, Scarring, Dry/Scaly, Maceration. Periwound temperature was noted as No Abnormality. Wound #6 status is Open. Original cause of wound was Gradually Appeared. The date acquired was: 07/16/2022. The wound has been in  treatment 4 weeks. The wound is located on the Right Amputation Site - Transmetatarsal. The wound measures 0.4cm length x 0.2cm width x 0.5cm depth; 0.063cm^2 area and 0.031cm^3 volume. There is Fat Layer (Subcutaneous Tissue) exposed. There is no tunneling or undermining noted. There is a medium amount of serosanguineous drainage noted. There is large (67-100%) red granulation within the wound bed. There is no necrotic tissue within the wound bed. The periwound skin appearance exhibited: Callus. The periwound skin appearance did not  exhibit: Crepitus, Excoriation, Induration, Rash, Scarring, Dry/Scaly, Maceration, Atrophie Blanche, Cyanosis, Ecchymosis, Hemosiderin Staining, Mottled, Pallor, Rubor, Erythema. Assessment Active Problems ICD-10 Non-pressure chronic ulcer of other part of left foot with fat layer exposed Other acute osteomyelitis, left ankle and foot Non-pressure chronic ulcer of other part of left foot with other specified severity Type 2 diabetes mellitus with foot ulcer Type 2 diabetes mellitus with diabetic polyneuropathy Unspecified open wound, right foot, initial encounter 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 severity Patient's wounds appear well-healing. I debrided nonviable tissue. Grafix was placed in standard fashion to the left foot wound. The remaining was placed to the right foot wound as well. I recommended changing the outer dressing daily. Continue aggressive offloading with surgical shoes. Continue hyperbaric oxygen therapy. Follow-up in 1 week. Procedures Wound #5 Pre-procedure diagnosis of Wound #5 is a Diabetic Wound/Ulcer of the Lower Extremity located on the Left,Lateral Foot .Severity of Tissue Pre Debridement is: Fat layer exposed. There was a Excisional Skin/Subcutaneous Tissue Debridement with a total area of 2.7 sq cm performed by Elenor Legato ([id number])  [id number] Page 12 of 15 With the following instrument(s): Curette to remove Viable and Non-Viable tissue/material. Material removed includes Callus, Subcutaneous Tissue, Slough, Skin: Dermis, and Skin: Epidermis after achieving pain control using Lidocaine 4% Topical Solution. A time out was conducted at 11:10, prior to the start of the procedure. A Minimum amount of bleeding was controlled with Pressure. The procedure was tolerated well with a pain level of 0 throughout and a pain level of 0 following the procedure. Post Debridement Measurements: 4.5cm length x 0.6cm width x 0.3cm depth; 0.636cm^3 volume. Character of Wound/Ulcer Post Debridement is improved. Severity of Tissue Post Debridement is: Fat layer exposed. Post procedure Diagnosis Wound #5: Same as Pre-Procedure Pre-procedure diagnosis of Wound #5 is a Diabetic Wound/Ulcer of the Lower Extremity located on the Left,Lateral Foot. A skin graft procedure using a bioengineered skin substitute/cellular or tissue based product was performed by [name], DO with the following instrument(s): Forceps and Scissors. Grafix prime was applied and secured with Steri-Strips and adaptic. 2 sq cm of product was utilized and 2 sq cm was wasted due to wound size. Post Application, no dressing was applied. A Time Out was conducted at 10:25, prior to the start of the procedure. The procedure was tolerated well with a pain level of 0 throughout and a pain level of 0 following the procedure. Post procedure Diagnosis Wound #5: Same as Pre-Procedure . 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 There was a Excisional Skin/Subcutaneous Tissue Debridement with a total area of 4 sq cm performed by [name], DO. With the following instrument(s): Curette to  remove Viable and Non-Viable tissue/material. Material removed includes Callus, Subcutaneous Tissue, Slough, Skin: Dermis, and Skin: Epidermis after achieving pain control using Lidocaine 4% T opical Solution. A time out was conducted at 11:10, prior to the start of the procedure. A Minimum amount of bleeding was controlled with Pressure. The procedure was tolerated well with a pain level of 0 throughout and a pain level of 0 following the procedure. Post Debridement Measurements: 0cm length x 0.2cm width x 0.5cm depth; 0cm^3 volume. Character of Wound/Ulcer Post Debridement is improved. Severity of Tissue Post Debridement is: Fat layer exposed. Post procedure Diagnosis Wound #6: Same as  Pre-Procedure Pre-procedure diagnosis of Wound #6 is a Diabetic Wound/Ulcer of the Lower Extremity located on the Right Amputation Site - Transmetatarsal. A skin graft procedure using a bioengineered skin substitute/cellular or tissue based product was performed by [name], DO with the following instrument(s): Forceps and Scissors. Grafix prime was applied and secured with Steri-Strips and adaptic. 2 sq cm of product was utilized and 0 sq cm was wasted. Post Application, no dressing was applied. A Time Out was conducted at 10:25, prior to the start of the procedure. The procedure was tolerated well with a pain level of 0 throughout and a pain level of 0 following the procedure. Post procedure Diagnosis Wound #6: Same as Pre-Procedure . Plan Follow-up Appointments: Return Appointment in 1 week. - Dr. Heber Milford Thursday 1015 08/20/2022 Return Appointment in 2 weeks. - w/ Dr. Heber Richardson Thursday 08/27/2022 1015 overflow Other: - ****ONLY CHANGE OUTTER DRESSINGS ONLY.*** Anesthetic: (In clinic) Topical Lidocaine 5% applied to wound bed Cellular or Tissue Based Products: Cellular or Tissue Based Product Type: - Run IVR for Grafix for left dorsal foot- pending Grafix # 1 applied 06/11/22 Grafix # 2 applied 06/18/22  Grafix # 3 applied 06/25/22 GRafix # 4 applied 07/02/22 Grafix # 5 applied 07/10/22 Grafix # 6 applied 07/17/22 Grafix # 7 applied 07/24/22---Also, go ahead and run IVR for more Grafix for left lateral foot Grafix #8 applied 07/31/2022 Grafix # 9 applied 08/07/22] left foot Grafix #10 applied 08/13/2022 left and right foot wounds today split the grafix in half. Cellular or Tissue Based Product applied to wound bed, secured with steri-strips, cover with Adaptic or Mepitel. (DO NOT REMOVE). Edema Control - Lymphedema / SCD / Other: Avoid standing for long periods of time. Off-Loading: Open toe surgical shoe to: - wear when standing or walking. Hyperbaric Oxygen Therapy: Evaluate for HBO Therapy - 07/24/22- Pt. to finish next 8 HBO treatments 07/24/22- DR. Carnell Beavers wants pt. 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 Follow Hyperbaric Oxygen Glycemia Protocol Afrin (Oxymetazoline HCL) 0.05% nasal spray - 1 spray in both nostrils daily as needed prior to HBO treatment for difficulty clearing ears WOUND #5: - Foot Wound Laterality: Left, Lateral Cleanser: Wound Cleanser (Generic) 1 x Per Day/30 Days Discharge Instructions: Cleanse the wound with wound cleanser prior to applying a clean dressing using gauze sponges, not tissue or cotton balls. Peri-Wound Care: Skin Prep (Generic) 1 x Per Day/30 Days Discharge Instructions: Use skin prep as directed Prim Dressing: Grafix #10 (Generic) 1 x Per Day/30 Days ary Discharge Instructions: applied by provider. Prim Dressing: adaptic and steri-strips 1 x Per Day/30 Days ary Discharge Instructions: to secure grafix to  wound bed. leave in place. Secondary Dressing: ABD Pad, 8x10 (Generic) 1 x Per Day/30 Days Discharge Instructions: Apply over primary dressing as directed. Secured With: [employer], 4.5x3.1 (in/yd) (Generic) 1 x Per Day/30 Days Discharge Instructions: Secure with Kerlix as directed. Secured With: 40M Medipore H Soft Cloth Surgical T ape, 4 x 10 (in/yd) (Generic) 1 x Per Day/30 Days Discharge Instructions: Secure with tape as directed. WOUND #6: - Amputation Site - Transmetatarsal Wound Laterality: Right Cleanser: Wound Cleanser (Generic) 1 x Per Day/30  Days Discharge Instructions: Cleanse the wound with wound cleanser prior to applying a clean dressing using gauze sponges, not tissue or cotton balls. Peri-Wound Care: Skin Prep (Generic) 1 x Per Day/30 Days Discharge Instructions: Use skin prep as directed Prim Dressing: Grafix #10 (Generic) 1 x Per Day/30 Days ary Discharge Instructions: applied by provider. Prim Dressing: adaptic and steri-strips 1 x Per Day/30 Days ary Discharge Instructions: to secure grafix to wound bed. leave in place. Secondary Dressing: ABD Pad, 8x10 (Generic) 1 x Per Day/30 Days ADONTE, WEDDEL A ([id number]) [id number] Page 13 of 15 Discharge Instructions: Apply over primary dressing as directed. Secured With: [employer], 4.5x3.1 (in/yd) (Generic) 1 x Per Day/30 Days Discharge Instructions: Secure with Kerlix as directed. Secured With: 76M Medipore H Soft Cloth Surgical T ape, 4 x 10 (in/yd) (Generic) 1 x Per Day/30 Days Discharge Instructions: Secure with tape as directed. 1. In office sharp debridement 2. Grafix placed in standard fashion 3. Aggressive offloadingoosurgical shoe 4. Follow-up in 1 week 5. Continue hyperbaric oxygen therapy Electronic Signature(s) Signed: 08/13/2022 3:04:17 PM By: [name] DO Entered By: [name] on 08/13/2022  11:35:57 -------------------------------------------------------------------------------- HxROS Details Patient Name: Date of Service: [name], [name] 08/13/2022 10:15 A M Medical Record Number: [id number] Patient Account Number: [id number] Date of Birth/Sex: Treating RN: 03-06-1956 (67 y.o. M) Primary Care Provider: [name] Other Clinician: Referring Provider: Treating Provider/Extender: [name] in Treatment: 17 Information Obtained From Patient Chart Eyes Medical History: Negative for: Cataracts; Glaucoma; Optic Neuritis Respiratory Medical History: Positive for: Asthma; Sleep Apnea Negative for: Aspiration; Chronic Obstructive Pulmonary Disease (COPD); Pneumothorax; Tuberculosis Cardiovascular Medical History: Positive for: Hypertension; Peripheral Venous Disease Negative for: Angina; Arrhythmia; Congestive Heart Failure; Coronary Artery Disease; Deep Vein Thrombosis; Hypotension; Myocardial Infarction; Peripheral Arterial Disease; Phlebitis; Vasculitis Endocrine Medical History: Positive for: Type II Diabetes Negative for: Type I Diabetes Time with diabetes: 20 years Treated with: Insulin, Oral agents Blood sugar tested every day: Yes Tested : 4x day Integumentary (Skin) Medical History: Negative for: History of Burn Musculoskeletal Medical History: Positive for: Osteomyelitis Negative for: Gout; Rheumatoid Arthritis; Osteoarthritis Neurologic ADRYAN, [name] A ([id number]) [id number] Page 14 of 15 Medical History: Positive for: Neuropathy Negative for: Dementia; Quadriplegia; Paraplegia; Seizure Disorder Psychiatric Medical History: Past Medical History Notes: PTSD Immunizations Pneumococcal Vaccine: Received Pneumococcal Vaccination: No Implantable Devices None Hospitalization / Surgery History Type of Hospitalization/Surgery 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  Posey Pronto aortogram 12/08/2021 Dr. Virl Cagey [facility] Family and Social History Cancer: No; Diabetes: Yes - Mother,Siblings; Heart Disease: Yes - Siblings; Hereditary Spherocytosis: No; Hypertension: Yes - Siblings; Kidney Disease: No; Lung Disease: No; Seizures: No; Stroke: Yes - Mother; Thyroid Problems: No; Tuberculosis: No; Former smoker; Marital Status - Single; Alcohol Use: Never; Drug Use: No History; Caffeine Use: Daily; Financial Concerns: No; Food, Clothing or Shelter Needs: No; Support System Lacking: No; Transportation Concerns: No Electronic Signature(s) Signed: 08/13/2022 3:04:17 PM By: [name] DO Entered By: [name] on 08/13/2022 11:33:41 -------------------------------------------------------------------------------- SuperBill Details Patient Name: Date of Service: [name], [name] 08/13/2022 Medical Record Number: [id number] Patient Account Number: [id number] Date of Birth/Sex: Treating RN: 1955/12/11 (67 y.o. [name] Primary Care Provider: [name] Other Clinician: Referring Provider: Treating Provider/Extender: [name] in Treatment: 17 Diagnosis Coding ICD-10 Codes Code Description [phone] Non-pressure chronic ulcer of other part of left foot with fat layer exposed M86.172 Other acute osteomyelitis, left ankle and foot L97.528 Non-pressure  chronic ulcer of other part of left foot with other specified severity E11.621 Type 2 diabetes mellitus with foot ulcer E11.42 Type 2 diabetes mellitus with diabetic polyneuropathy S91.301A Unspecified open wound, right foot, initial encounter 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 severity Facility Procedures : CPT4 Code: [id number] Description: Q4133- Grafix PL 2X3 sq cm (6 units) Modifier: Quantity: 6 : KASEM, HAFELE Code: [id number] Tigran A ([id number] ICD L9 Description: [id number] - SKIN SUB GRAFT FACE/NK/HF/G ICD-10  Diagnosis Description 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 seve 0)  [id number] - DEB SUBQ TISSUE 20 SQ CM/< 5 -10 Diagnosis Description 7.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 severity Modifier: rity sician_51227. 9 1 Quantity: 1 pdf Page 15 of 15 Physician Procedures : CPT4 Code Description Modifier [id number] - WC PHYS SKIN SUB GRAFT FACE/NK/HF/G ICD-10 Diagnosis Description 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.518 Non-pressure chronic ulcer of other part of  right foot with other specified severity Quantity: 1 : [id number] - WC PHYS SUBQ TISS 20 SQ CM 59 ICD-10 Diagnosis Description 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 severity Quantity: 1 Electronic Signature(s) Signed: 08/13/2022 3:04:17 PM By: [name] DO Entered By: [name] on 08/13/2022 11:54:53

## 2022-08-17 ENCOUNTER — Encounter (HOSPITAL_BASED_OUTPATIENT_CLINIC_OR_DEPARTMENT_OTHER): Payer: Medicare HMO | Admitting: Internal Medicine

## 2022-08-17 DIAGNOSIS — E11621 Type 2 diabetes mellitus with foot ulcer: Secondary | ICD-10-CM | POA: Diagnosis not present

## 2022-08-17 DIAGNOSIS — M86172 Other acute osteomyelitis, left ankle and foot: Secondary | ICD-10-CM

## 2022-08-17 DIAGNOSIS — L97522 Non-pressure chronic ulcer of other part of left foot with fat layer exposed: Secondary | ICD-10-CM

## 2022-08-17 DIAGNOSIS — L97528 Non-pressure chronic ulcer of other part of left foot with other specified severity: Secondary | ICD-10-CM

## 2022-08-17 LAB — GLUCOSE, CAPILLARY
Glucose-Capillary: 150 mg/dL — ABNORMAL HIGH (ref 70–99)
Glucose-Capillary: 165 mg/dL — ABNORMAL HIGH (ref 70–99)
Glucose-Capillary: 113 mg/dL — ABNORMAL HIGH (ref 70–99)

## 2022-08-17 NOTE — Progress Notes (Signed)
STANISLAUS, BODKINS A ([id number]) [id number] Page 1 of 1 Visit Report for 08/17/2022 SuperBill Details Patient Name: Date of Service: [name], [name] 08/17/2022 Medical Record Number: [id number] Patient Account Number: [id number] Date of Birth/Sex: Treating RN: [birth year] (67 y.o. [name], [name] Primary Care Provider: [name] Other Clinician: [name] Referring Provider: Treating Provider/Extender: [name] in Treatment: 17 Diagnosis Coding ICD-10 Codes Code Description [phone] Non-pressure chronic ulcer of other part of left foot with fat layer exposed M86.172 Other acute osteomyelitis, left ankle and foot L97.528 Non-pressure chronic ulcer of other part of left foot with other specified severity E11.621 Type 2 diabetes mellitus with foot ulcer E11.42 Type 2 diabetes mellitus with diabetic polyneuropathy S91.301A Unspecified open wound, right foot, initial encounter 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 severity Facility Procedures CPT4 Code Description Modifier Quantity [id number]-(Facility Use Only) HBOT full body chamber, 35mn , 4 ICD-10 Diagnosis Description E11.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 M86.172 Other acute osteomyelitis, left ankle and foot Physician Procedures Quantity CPT4 Code Description Modifier [id number] - WC PHYS HYPERBARIC OXYGEN THERAPY 1 ICD-10 Diagnosis Description E11.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 M86.172 Other acute osteomyelitis, left ankle and  foot Electronic Signature(s) Signed: 08/17/2022 1:12:32 PM By: SDonavan BurnetCHT EMT BS , , Signed: 08/17/2022 1:15:42 PM By: HKalman ShanDO Entered By: SDonavan Burneton 08/17/2022 13:12:31

## 2022-08-17 NOTE — Progress Notes (Addendum)
ISSAM, COMMINGS A ([id number]) [id number] Page 1 of 2 Visit Report for 08/17/2022 Arrival Information Details Patient Name: Date of Service: [name], [name] 08/17/2022 8:00 A M Medical Record Number: [id number] Patient Account Number: [id number] Date of Birth/Sex: Treating RN: 05-Jul-1956 (67 y.o. [name] Primary Care Aslan Himes: [name] Other Clinician: [name] Referring Attilio Zeitler: Treating Lettie Czarnecki/Extender: [name] in Treatment: 15 Visit Information History Since Last Visit All ordered tests and consults were completed: Yes Patient Arrived: Wheel Chair Added or deleted any medications: No Arrival Time: 07:30 Any new allergies or adverse reactions: No Accompanied By: self Had a fall or experienced change in No Transfer Assistance: None activities of daily living that may affect Patient Identification Verified: Yes risk of falls: Secondary Verification Process Completed: Yes Signs or symptoms of abuse/neglect since last visito No Patient Requires Transmission-Based Precautions: No Hospitalized since last visit: No Patient Has Alerts: Yes Implantable device outside of the clinic excluding No cellular tissue based products placed in the center since last visit: Pain Present Now: No Electronic Signature(s) Signed: 08/17/2022 12:36:42 PM By: [name] CHT EMT BS , , Entered By: [name] on 08/17/2022 12:36:42 -------------------------------------------------------------------------------- Encounter Discharge Information Details Patient Name: Date of Service: [name], [name] 08/17/2022 8:00 A M Medical Record Number: [id number] Patient Account Number: [id number] Date of Birth/Sex: Treating RN: 21-Nov-1955 (67 y.o. [name] Primary Care Kristiann Noyce: [name] Other Clinician: [name] Referring Ameliya Nicotra: Treating Jameia Makris/Extender: [name] in Treatment: 17 Encounter Discharge Information Items Discharge Condition: Stable Ambulatory Status: Wheelchair Discharge Destination: Home Transportation: Private Auto Accompanied By: self Schedule Follow-up Appointment: No Clinical Summary of Care: Electronic Signature(s) Signed: 08/17/2022 1:13:14 PM By: [name] CHT EMT BS , , Entered By: [name] on 08/17/2022 13:13:14 [name] ([id number].pdf Page 2 of 2 -------------------------------------------------------------------------------- Vitals Details Patient Name: Date of Service: [name], [name] 08/17/2022 8:00 A M Medical Record Number: [id number] Patient Account Number: [id number] Date of Birth/Sex: Treating RN: 1956-05-25 (67 y.o. [name], [name] Primary Care Maika Kaczmarek: [name] Other Clinician: [name] Referring Fortunato Nordin: Treating Aloha Bartok/Extender: [name] in Treatment: 17 Vital Signs Time Taken: 07:43 Temperature (F): 98.8 Height (in): 72 Pulse (bpm): 86 Weight (lbs): 220 Respiratory Rate (breaths/min): 18 Body Mass Index (BMI): 29.8 Blood Pressure (mmHg): 132/73 Capillary Blood Glucose (mg/dl): 150 Reference Range: 80 - 120 mg / dl Electronic Signature(s) Signed: 08/17/2022 12:37:10 PM By: [name] CHT EMT BS , , Entered By: [name] on 08/17/2022 12:37:10

## 2022-08-17 NOTE — Progress Notes (Signed)
SEYED, ZIENTEK A ([id number]) [id number] Page 1 of 1 Visit Report for 08/14/2022 SuperBill Details Patient Name: Date of Service: [name], [name] A. 08/14/2022 Medical Record Number: [id number] Patient Account Number: [id number] Date of Birth/Sex: Treating RN: Apr 23, 1956 (67 y.o. [name] Primary Care Provider: [name] Other Clinician: [name] Referring Provider: Treating Provider/Extender: [name] in Treatment: 17 Diagnosis Coding ICD-10 Codes Code Description [phone] Non-pressure chronic ulcer of other part of left foot with fat layer exposed M86.172 Other acute osteomyelitis, left ankle and foot L97.528 Non-pressure chronic ulcer of other part of left foot with other specified severity E11.621 Type 2 diabetes mellitus with foot ulcer E11.42 Type 2 diabetes mellitus with diabetic polyneuropathy S91.301A Unspecified open wound, right foot, initial encounter 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 severity Facility Procedures CPT4 Code Description Modifier Quantity [id number] G0277-(Facility Use Only) HBOT full body chamber, 17mn , 4 ICD-10 Diagnosis Description E11.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 M86.172 Other acute osteomyelitis, left ankle and foot Physician Procedures Quantity CPT4 Code Description Modifier [id number] - WC PHYS HYPERBARIC OXYGEN THERAPY 1 ICD-10 Diagnosis Description E11.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 M86.172 Other acute osteomyelitis, left ankle and  foot Electronic Signature(s) Signed: 08/14/2022 12:46:52 PM By: SDonavan BurnetCHT EMT BS , , Signed: 08/17/2022 9:31:00 AM By: HKalman ShanDO Entered By: SDonavan Burneton 08/14/2022 12:46:51

## 2022-08-17 NOTE — Progress Notes (Addendum)
[name], NEET [name] ([id number]) [id number] Page 1 of 2 Visit Report for 08/17/2022 HBO Details Patient Name: Date of Service: [name], [name] 08/17/2022 8:00 [name] Medical Record Number: [id number] Patient Account Number: [id number] Date of Birth/Sex: Treating RN: 19-Sep-1955 (67 y.o. [name], [name] Primary Care Amare Kontos: [name] Other Clinician: [name] Referring Jaydn Moscato: Treating Dorse Locy/Extender: [name] in Treatment: 17 HBO Treatment Course Details Treatment Course Number: 1 Ordering Akshara Blumenthal: [name] T Treatments Ordered: otal 80 HBO Treatment Start Date: 06/01/2022 HBO Indication: Diabetic Ulcer(s) of the Lower Extremity Standard/Conservative Wound Care tried and failed greater than or equal to 30 days HBO Treatment Details Treatment Number: 46 Patient Type: Outpatient Chamber Type: Monoplace Chamber Serial #: [id number] Treatment Protocol: 2.5 ATA with 90 minutes oxygen, with two 5 minute air breaks Treatment Details Compression Rate Down: 1.5 psi / minute De-Compression Rate Up: 2.0 psi / minute [name] breaks and breathing ir Compress Tx Pressure periods Decompress Decompress Begins Reached (leave unused spaces Begins Ends blank) Chamber Pressure (ATA 1 2.5 2.5 2.5 2.5 2.5 - - 2.5 1 ) Clock Time (24 hr) 08:09 08:24 08:54 08:59 09:29 09:34 - - 10:04 10:16 Treatment Length: 127 (minutes) Treatment Segments: 4 Vital Signs Capillary Blood Glucose Reference Range: 80 - 120 mg / dl HBO Diabetic Blood Glucose Intervention Range: <131 mg/dl or >249 mg/dl Type: Time Vitals Blood Pulse: Respiratory Temperature: Capillary Blood Glucose Pulse Action Taken: Pressure: Rate: Glucose (mg/dl): Meter #: Oximetry (%) Taken: Pre 07:43 132/73 86 18 98.8 150 1 Glucose level taken again for safety Post 10:22 158/84 82 18 97.5 113 1 none per protocol Pre 07:59 165 1 Glucose level taken again for  safety Treatment Response Treatment Toleration: Well Treatment Completion Status: Treatment Completed without Adverse Event Treatment Notes Upon arrival patient's blood glucose level was taken at 150 mg/dL, stating that he ate [name] [place], sausage, and cheese on english muffin with orange juice. Patient prepared for treatment and blood glucose was re-measured based on safety and prior encounters with [name] result of 165 mg/dL. Patient tolerated travel at 1 psi/min until reaching 7 psig and rate set was increased to 2 psi/min until reaching 2.5 ATA. Patient tolerated treatment well and decompression of the chamber at 2 psi/min. Post-treatment glucose was 113 mg/dL. Patient was stable upon discharge. Additional Procedure Documentation Tissue Sevierity: Necrosis of bone Physician HBO Attestation: I certify that I supervised this HBO treatment in accordance with Medicare guidelines. [name] trained emergency response team is readily available per Yes hospital policies and procedures. Continue HBOT as ordered. Yes Electronic Signature(s) Signed: 08/17/2022 3:45:51 PM By: [name] DO Previous Signature: 08/17/2022 1:11:27 PM Version By: [name] CHT EMT BS , , [name] ([id number]) [id number] Page 2 of 2 Previous Signature: 08/17/2022 1:11:27 PM Version By: [name] CHT EMT BS , , Previous Signature: 08/17/2022 1:15:42 PM Version By: [name] DO Entered By: [name] on 08/17/2022 15:45:10 -------------------------------------------------------------------------------- HBO Safety Checklist Details Patient Name: Date of Service: [name], [name] 08/17/2022 8:00 [name] Medical Record Number: [id number] Patient Account Number: [id number] Date of Birth/Sex: Treating RN: February 25, 1956 (67 y.o. [name] Primary Care Grainne Knights: [name] Other Clinician: [name] Referring Billi Bright: Treating [name]/Extender: [name] in Treatment: 17 HBO Safety Checklist Items Safety Checklist Consent Form Signed Patient voided / foley secured and emptied When did you last eato 0715 Last dose of injectable or oral agent 0630 Ostomy  pouch emptied and vented if applicable NA All implantable devices assessed, documented and approved NA Intravenous access site secured and place NA Valuables secured Linens and cotton and cotton/polyester blend (less than 51% polyester) Personal oil-based products / skin lotions / body lotions removed Wigs or hairpieces removed NA Smoking or tobacco materials removed NA Books / newspapers / magazines / loose paper removed Cologne, aftershave, perfume and deodorant removed Jewelry removed (may wrap wedding band) Make-up removed NA Hair care products removed Battery operated devices (external) removed Heating patches and chemical warmers removed Titanium eyewear removed Nail polish cured greater than 10 hours NA Casting material cured greater than 10 hours NA Hearing aids removed NA Loose dentures or partials removed dentures removed Prosthetics have been removed NA Patient demonstrates correct use of air break device (if applicable) Patient concerns have been addressed Patient grounding bracelet on and cord attached to chamber Specifics for Inpatients (complete in addition to above) Medication sheet sent with patient NA Intravenous medications needed or due during therapy sent with patient NA Drainage tubes (e.g. nasogastric tube or chest tube secured and vented) NA Endotracheal or Tracheotomy tube secured NA Cuff deflated of air and inflated with saline NA Airway suctioned NA Notes Paper version used prior to treatment. Electronic Signature(s) Signed: 08/17/2022 12:40:46 PM By: [name] CHT EMT BS , , Entered By: [name] on 08/17/2022 12:40:46

## 2022-08-18 ENCOUNTER — Encounter (HOSPITAL_BASED_OUTPATIENT_CLINIC_OR_DEPARTMENT_OTHER): Payer: Medicare HMO | Admitting: Internal Medicine

## 2022-08-18 DIAGNOSIS — L97528 Non-pressure chronic ulcer of other part of left foot with other specified severity: Secondary | ICD-10-CM

## 2022-08-18 DIAGNOSIS — M86172 Other acute osteomyelitis, left ankle and foot: Secondary | ICD-10-CM | POA: Diagnosis not present

## 2022-08-18 DIAGNOSIS — E11621 Type 2 diabetes mellitus with foot ulcer: Secondary | ICD-10-CM

## 2022-08-18 DIAGNOSIS — L97522 Non-pressure chronic ulcer of other part of left foot with fat layer exposed: Secondary | ICD-10-CM | POA: Diagnosis not present

## 2022-08-18 LAB — GLUCOSE, CAPILLARY
Glucose-Capillary: 148 mg/dL — ABNORMAL HIGH (ref 70–99)
Glucose-Capillary: 81 mg/dL (ref 70–99)

## 2022-08-18 NOTE — Progress Notes (Signed)
ANEIL, ANGELINI A ([id number]) [id number] Page 1 of 2 Visit Report for 08/18/2022 HBO Details Patient Name: Date of Service: [name], [name] [name] 08/18/2022 8:00 A M Medical Record Number: [id number] Patient Account Number: [id number] Date of Birth/Sex: Treating RN: [birth year] (67 y.o. [name] Primary Care Lakeeta Dobosz: [name] Other Clinician: [name] Referring Nakya Weyand: Treating Leesa Leifheit/Extender: [name] in Treatment: 18 HBO Treatment Course Details Treatment Course Number: 1 Ordering Aylanie Cubillos: [name] T Treatments Ordered: otal 80 HBO Treatment Start Date: 06/01/2022 HBO Indication: Diabetic Ulcer(s) of the Lower Extremity Standard/Conservative Wound Care tried and failed greater than or equal to 30 days HBO Treatment Details Treatment Number: 47 Patient Type: Outpatient Chamber Type: Monoplace Chamber Serial #: [id number] Treatment Protocol: 2.5 ATA with 90 minutes oxygen, with two 5 minute air breaks Treatment Details Compression Rate Down: 1.5 psi / minute De-Compression Rate Up: 2.0 psi / minute A breaks and breathing ir Compress Tx Pressure periods Decompress Decompress Begins Reached (leave unused spaces Begins Ends blank) Chamber Pressure (ATA 1 2.5 2.5 2.5 2.5 2.5 - - 2.5 1 ) Clock Time (24 hr) 08:05 08:21 08:51 08:56 09:26 09:31 - - 10:01 10:10 Treatment Length: 125 (minutes) Treatment Segments: 4 Vital Signs Capillary Blood Glucose Reference Range: 80 - 120 mg / dl HBO Diabetic Blood Glucose Intervention Range: <131 mg/dl or >249 mg/dl Type: Time Vitals Blood Pulse: Respiratory Temperature: Capillary Blood Glucose Pulse Action Taken: Pressure: Rate: Glucose (mg/dl): Meter #: Oximetry (%) Taken: Pre 07:53 138/80 82 18 98.4 148 1 none per protocol Post 10:21 139/75 72 18 97.2 81 1 Patient given 8 oz Glucerna Shake, per protocol. Treatment Response Treatment Toleration:  Well Treatment Completion Status: Treatment Completed without Adverse Event Treatment Notes Patient arrived for treatment, blood glucose measured at 148 mg/dL. Patient stated that he ate [place] sausage, egg, and cheese on english muffin with orange juice. After preparing (patient self administered Afrin) for treatment and safety check patient placed in the chamber and chamber was compressed at 1 psi/min until patient verified proper ear equalization at 4 psig. At this time rate set was increased to 2 psi/min. Mr. Muratalla tolerated treatment and decompression of chamber at 2 psi/min. Post-treatment glucose level was 81 mg/dL. Per protocol patient was given 8 oz Glucerna and after 15 minutes patient was asymptomatic for hypoglycemia and was stable upon discharge. Additional Procedure Documentation Tissue Sevierity: Necrosis of bone Physician HBO Attestation: I certify that I supervised this HBO treatment in accordance with Medicare guidelines. A trained emergency response team is readily available per Yes hospital policies and procedures. Continue HBOT as ordered. Yes Electronic Signature(s) Signed: 08/18/2022 3:51:25 PM By: [name] DO Previous Signature: 08/18/2022 1:38:59 PM Version By: [name] CHT EMT BS , , Previous Signature: 08/18/2022 1:37:31 PM Version By: [name] CHT EMT BS , , [name] ([id number].pdf Page 2 of 2 Previous Signature: 08/18/2022 1:37:31 PM Version By: [name] CHT EMT BS , , Previous Signature: 08/18/2022 1:30:59 PM Version By: [name] CHT EMT BS , , Entered By: [name] on 08/18/2022 15:49:31 -------------------------------------------------------------------------------- HBO Safety Checklist Details Patient Name: Date of Service: [name], [name] [name] 08/18/2022 8:00 A M Medical Record Number: [id number] Patient Account Number: [id number] Date of Birth/Sex: Treating  RN: 1956-02-28 (67 y.o. [name] Primary Care Jewelz Kobus: [name] Other Clinician: [name] Referring Sahaana Weitman: Treating Marquita Lias/Extender: [name] in Treatment: 18 HBO Safety Checklist Items Safety  Checklist Consent Form Signed Patient voided / foley secured and emptied When did you last eato 0705 Last dose of injectable or oral agent 0630 Ostomy pouch emptied and vented if applicable NA All implantable devices assessed, documented and approved NA Intravenous access site secured and place NA Valuables secured Linens and cotton and cotton/polyester blend (less than 51% polyester) Personal oil-based products / skin lotions / body lotions removed Wigs or hairpieces removed NA Smoking or tobacco materials removed NA Books / newspapers / magazines / loose paper removed Cologne, aftershave, perfume and deodorant removed Jewelry removed (may wrap wedding band) Make-up removed NA Hair care products removed Battery operated devices (external) removed Heating patches and chemical warmers removed Titanium eyewear removed Nail polish cured greater than 10 hours NA Casting material cured greater than 10 hours NA Hearing aids removed NA Loose dentures or partials removed dentures removed Prosthetics have been removed NA Patient demonstrates correct use of air break device (if applicable) Patient concerns have been addressed Patient grounding bracelet on and cord attached to chamber Specifics for Inpatients (complete in addition to above) Medication sheet sent with patient NA Intravenous medications needed or due during therapy sent with patient NA Drainage tubes (e.g. nasogastric tube or chest tube secured and vented) NA Endotracheal or Tracheotomy tube secured NA Cuff deflated of air and inflated with saline NA Airway suctioned NA Notes Paper version used prior to treatment start. Electronic Signature(s) Signed:  08/18/2022 1:29:31 PM By: [name] CHT EMT BS , , Entered By: [name] on 08/18/2022 13:29:31

## 2022-08-18 NOTE — Progress Notes (Signed)
[name], TURRENTINE [name] ([id number]) [id number] Page 1 of 2 Visit Report for 08/18/2022 Arrival Information Details Patient Name: Date of Service: [name], [name] 08/18/2022 8:00 [name] Medical Record Number: [id number] Patient Account Number: [id number] Date of Birth/Sex: Treating RN: 1956-02-06 (67 y.o. [name] Primary Care Maddyn Lieurance: [name] Other Clinician: [name] Referring Brettany Sydney: Treating Cherly Erno/Extender: [name] in Treatment: 18 Visit Information History Since Last Visit All ordered tests and consults were completed: Yes Patient Arrived: [name] Added or deleted any medications: No Arrival Time: 07:30 Any new allergies or adverse reactions: No Accompanied By: self Had [name] fall or experienced change in No Transfer Assistance: None activities of daily living that may affect Patient Identification Verified: Yes risk of falls: Secondary Verification Process Completed: Yes Signs or symptoms of abuse/neglect since last visito No Patient Requires Transmission-Based Precautions: No Hospitalized since last visit: No Patient Has Alerts: Yes Implantable device outside of the clinic excluding No cellular tissue based products placed in the center since last visit: Pain Present Now: No Electronic Signature(s) Signed: 08/18/2022 1:25:22 PM By: [name] CHT EMT BS , , Entered By: [name] on 08/18/2022 13:25:22 -------------------------------------------------------------------------------- Encounter Discharge Information Details Patient Name: Date of Service: [name], [name] 08/18/2022 8:00 [name] Medical Record Number: [id number] Patient Account Number: [id number] Date of Birth/Sex: Treating RN: 1956/02/24 (67 y.o. [name] Primary Care Montoya Watkin: [name] Other Clinician: [name] Referring [name]: Treating [name]/Extender: [name] in Treatment: 18 Encounter Discharge Information Items Discharge Condition: Stable Ambulatory Status: Wheelchair Discharge Destination: Home Transportation: Private Auto Accompanied By: self Schedule Follow-up Appointment: No Clinical Summary of Care: Electronic Signature(s) Signed: 08/18/2022 1:32:37 PM By: [name] CHT EMT BS , , Entered By: [name] on 08/18/2022 13:32:37 [name] ([id number].pdf Page 2 of 2 -------------------------------------------------------------------------------- Vitals Details Patient Name: Date of Service: [name], [name] 08/18/2022 8:00 [name] Medical Record Number: [id number] Patient Account Number: [id number] Date of Birth/Sex: Treating RN: 09-06-1955 (67 y.o. [name] Primary Care Dragon Thrush: [name] Other Clinician: [name] Referring [name]: Treating [name]/Extender: [name] in Treatment: 18 Vital Signs Time Taken: 07:53 Temperature (F): 98.4 Height (in): 72 Pulse (bpm): 82 Weight (lbs): 220 Respiratory Rate (breaths/min): 18 Body Mass Index (BMI): 29.8 Blood Pressure (mmHg): 138/80 Capillary Blood Glucose (mg/dl): 148 Reference Range: 80 - 120 mg / dl Electronic Signature(s) Signed: 08/18/2022 1:27:45 PM By: [name] CHT EMT BS , , Entered By: [name] on 08/18/2022 13:27:45

## 2022-08-19 ENCOUNTER — Encounter (HOSPITAL_BASED_OUTPATIENT_CLINIC_OR_DEPARTMENT_OTHER): Payer: Medicare HMO | Admitting: Physician Assistant

## 2022-08-19 DIAGNOSIS — E11621 Type 2 diabetes mellitus with foot ulcer: Secondary | ICD-10-CM | POA: Diagnosis not present

## 2022-08-19 LAB — GLUCOSE, CAPILLARY
Glucose-Capillary: 138 mg/dL — ABNORMAL HIGH (ref 70–99)
Glucose-Capillary: 80 mg/dL (ref 70–99)

## 2022-08-19 NOTE — Progress Notes (Signed)
[name], [name] ([id number]) [id number] Page 1 of 1 Visit Report for 08/18/2022 SuperBill Details Patient Name: Date of Service: [name], [name] A. 08/18/2022 Medical Record Number: [id number] Patient Account Number: [id number] Date of Birth/Sex: Treating RN: 07/18/1955 (67 y.o. [name] Primary Care Provider: [name] Other Clinician: [name] Referring Provider: Treating Provider/Extender: [name] in Treatment: 18 Diagnosis Coding ICD-10 Codes Code Description [phone] Non-pressure chronic ulcer of other part of left foot with fat layer exposed M86.172 Other acute osteomyelitis, left ankle and foot L97.528 Non-pressure chronic ulcer of other part of left foot with other specified severity E11.621 Type 2 diabetes mellitus with foot ulcer E11.42 Type 2 diabetes mellitus with diabetic polyneuropathy S91.301A Unspecified open wound, right foot, initial encounter 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 severity Facility Procedures CPT4 Code Description Modifier Quantity [id number]-(Facility Use Only) HBOT full body chamber, 2mn , 4 ICD-10 Diagnosis Description E11.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 M86.172 Other acute osteomyelitis, left ankle and foot Physician Procedures Quantity CPT4 Code Description Modifier [id number] - WC PHYS HYPERBARIC OXYGEN THERAPY 1 ICD-10 Diagnosis Description E11.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

## 2022-08-20 ENCOUNTER — Encounter (HOSPITAL_BASED_OUTPATIENT_CLINIC_OR_DEPARTMENT_OTHER): Payer: Medicare HMO | Admitting: Internal Medicine

## 2022-08-20 DIAGNOSIS — M86172 Other acute osteomyelitis, left ankle and foot: Secondary | ICD-10-CM

## 2022-08-20 DIAGNOSIS — E11621 Type 2 diabetes mellitus with foot ulcer: Secondary | ICD-10-CM

## 2022-08-20 DIAGNOSIS — L97522 Non-pressure chronic ulcer of other part of left foot with fat layer exposed: Secondary | ICD-10-CM

## 2022-08-20 DIAGNOSIS — S91301A Unspecified open wound, right foot, initial encounter: Secondary | ICD-10-CM

## 2022-08-20 DIAGNOSIS — L97528 Non-pressure chronic ulcer of other part of left foot with other specified severity: Secondary | ICD-10-CM

## 2022-08-20 LAB — GLUCOSE, CAPILLARY
Glucose-Capillary: 135 mg/dL — ABNORMAL HIGH (ref 70–99)
Glucose-Capillary: 86 mg/dL (ref 70–99)

## 2022-08-20 NOTE — Progress Notes (Addendum)
ALRICK, WAIBEL [name] ([id number]) [id number] Page 1 of 2 Visit Report for 08/19/2022 HBO Details Patient Name: Date of Service: [name], [name]. 08/19/2022 8:00 [name] Medical Record Number: [id number] Patient Account Number: [id number] Date of Birth/Sex: Treating RN: October 03, 1955 (67 y.o. [name] Primary Care [name]: [name] Other Clinician: [name] Referring [name]: Treating [name]/Extender: [name], [name] in Treatment: 18 HBO Treatment Course Details Treatment Course Number: 1 Ordering [name]: [name] T Treatments Ordered: otal 80 HBO Treatment Start Date: 06/01/2022 HBO Indication: Diabetic Ulcer(s) of the Lower Extremity Standard/Conservative Wound Care tried and failed greater than or equal to 30 days HBO Treatment Details Treatment Number: 48 Patient Type: Outpatient Chamber Type: Monoplace Chamber Serial #: [id number] Treatment Protocol: 2.5 ATA with 90 minutes oxygen, with two 5 minute air breaks Treatment Details Compression Rate Down: 1.5 psi / minute De-Compression Rate Up: 2.0 psi / minute [name] breaks and breathing ir Compress Tx Pressure periods Decompress Decompress Begins Reached (leave unused spaces Begins Ends blank) Chamber Pressure (ATA 1 2.5 2.5 2.5 2.5 2.5 - - 2.5 1 ) Clock Time (24 hr) 08:08 08:20 08:50 08:55 09:22 09:27 - - 10:00 10:11 Treatment Length: 123 (minutes) Treatment Segments: 4 Vital Signs Capillary Blood Glucose Reference Range: 80 - 120 mg / dl HBO Diabetic Blood Glucose Intervention Range: <131 mg/dl or >249 mg/dl Type: Time Vitals Blood Pulse: Respiratory Temperature: Capillary Blood Glucose Pulse Action Taken: Pressure: Rate: Glucose (mg/dl): Meter #: Oximetry (%) Taken: Pre 07:43 134/78 138 18 98.1 138 1 none per protocol Post 10:18 160/93 85 18 97.3 80 1 Patient given 8 oz Glucerna Shake per protocol Treatment Response Treatment Toleration:  Well Treatment Completion Status: Treatment Completed without Adverse Event Treatment Notes Patient arrived for treatment, blood glucose measured at 138 mg/dL. Patient stated that he ate [place] sausage, egg, and cheese on english muffin with orange juice. After preparing (patient self administered Afrin) for treatment and following safety check patient was placed in the chamber and chamber was compressed at 1 psi/min until patient verified proper ear equalization at 3 psig. At this time rate set was increased to 2 psi/min. Mr. Wooldridge tolerated treatment and decompression of chamber at 2 psi/min. Post-treatment glucose level was 80 mg/dL. Per protocol patient was given 8 oz Glucerna and after 15 minutes patient was asymptomatic for hypoglycemia. Patient was stable upon discharge. Additional Procedure Documentation Tissue Sevierity: Necrosis of bone Electronic Signature(s) Signed: 08/19/2022 1:55:57 PM By: [name] CHT EMT BS , , Signed: 08/19/2022 5:47:19 PM By: Worthy Keeler PA-C Entered By: [name] on 08/19/2022 13:55:57 [name] ([id number] Page 2 of 2 -------------------------------------------------------------------------------- HBO Safety Checklist Details Patient Name: Date of Service: [name], [name]. 08/19/2022 8:00 [name] Medical Record Number: [id number] Patient Account Number: [id number] Date of Birth/Sex: Treating RN: 1955/10/31 (67 y.o. [name], Vaughan Basta Primary Care Jayah Balthazar: [name] Other Clinician: [name] Referring Arley Salamone: Treating Reisha Wos/Extender: [name], [name] in Treatment: 18 HBO Safety Checklist Items Safety Checklist Consent Form Signed Patient voided / foley secured and emptied When did you last eato 0700 Last dose of injectable or oral agent 0630 Ostomy pouch emptied and vented if applicable NA All implantable devices assessed, documented and  approved NA Intravenous access site secured and place NA Valuables secured Linens and cotton and cotton/polyester blend (less than 51% polyester) Personal oil-based products / skin lotions / body lotions removed Wigs or hairpieces removed  NA Smoking or tobacco materials removed NA Books / newspapers / magazines / loose paper removed Cologne, aftershave, perfume and deodorant removed Jewelry removed (may wrap wedding band) NA Make-up removed NA Hair care products removed NA Battery operated devices (external) removed NA Heating patches and chemical warmers removed NA Titanium eyewear removed NA Nail polish cured greater than 10 hours NA Casting material cured greater than 10 hours NA Hearing aids removed NA Loose dentures or partials removed dentures removed Prosthetics have been removed NA Patient demonstrates correct use of air break device (if applicable) Patient concerns have been addressed Patient grounding bracelet on and cord attached to chamber Specifics for Inpatients (complete in addition to above) Medication sheet sent with patient NA Intravenous medications needed or due during therapy sent with patient NA Drainage tubes (e.g. nasogastric tube or chest tube secured and vented) NA Endotracheal or Tracheotomy tube secured NA Cuff deflated of air and inflated with saline NA Airway suctioned NA Notes Paper version used prior to treatment start. Electronic Signature(s) Signed: 08/19/2022 1:51:03 PM By: [name] CHT EMT BS , , Entered By: [name] on 08/19/2022 13:51:03

## 2022-08-20 NOTE — Progress Notes (Signed)
[name], FUDALA [name] ([id number]) [id number] Page 1 of 2 Visit Report for 08/19/2022 Arrival Information Details Patient Name: Date of Service: [name], [name] 08/19/2022 8:00 [name] Medical Record Number: [id number] Patient Account Number: [id number] Date of Birth/Sex: Treating RN: 1956/03/25 (67 y.o. [name] Primary Care Corney Knighton: [name] Other Clinician: [name] Referring Krishang Reading: Treating Taegen Lennox/Extender: [name], [name] in Treatment: 18 Visit Information History Since Last Visit All ordered tests and consults were completed: Yes Patient Arrived: [name] Added or deleted any medications: No Arrival Time: 07:30 Any new allergies or adverse reactions: No Accompanied By: self Had [name] fall or experienced change in No Transfer Assistance: None activities of daily living that may affect Patient Identification Verified: Yes risk of falls: Secondary Verification Process Completed: Yes Signs or symptoms of abuse/neglect since last visito No Patient Requires Transmission-Based Precautions: No Hospitalized since last visit: No Patient Has Alerts: Yes Implantable device outside of the clinic excluding No cellular tissue based products placed in the center since last visit: Pain Present Now: No Electronic Signature(s) Signed: 08/19/2022 11:37:46 AM By: [name] CHT EMT BS , , Entered By: [name] on 08/19/2022 11:37:45 -------------------------------------------------------------------------------- Encounter Discharge Information Details Patient Name: Date of Service: [name], [name] 08/19/2022 8:00 [name] Medical Record Number: [id number] Patient Account Number: [id number] Date of Birth/Sex: Treating RN: 30-Apr-1956 (67 y.o. [name] Primary Care Devin [name]: [name] Other Clinician: [name] Referring [name]: Treating Dracen Reigle/Extender: [name],  [name] in Treatment: 18 Encounter Discharge Information Items Discharge Condition: Stable Ambulatory Status: Wheelchair Discharge Destination: Home Transportation: Private Auto Accompanied By: self Schedule Follow-up Appointment: No Clinical Summary of Care: Notes Patient ambulates with [name] cane until he reaches the lobby and uses the wheel chair thereafter. Electronic Signature(s) Signed: 08/19/2022 1:58:26 PM By: [name] CHT EMT BS , , Entered By: [name] on 08/19/2022 13:58:26 [name] ([id number] Page 2 of 2 -------------------------------------------------------------------------------- Vitals Details Patient Name: Date of Service: [name], [name] 08/19/2022 8:00 [name] Medical Record Number: [id number] Patient Account Number: [id number] Date of Birth/Sex: Treating RN: March 20, 1956 (67 y.o. [name] Primary Care Glennie Bose: [name] Other Clinician: [name] Referring Charmeka Freeburg: Treating [name] Borger/Extender: [name], [name] in Treatment: 18 Vital Signs Time Taken: 07:43 Temperature (F): 98.1 Height (in): 72 Pulse (bpm): 138 Weight (lbs): 220 Respiratory Rate (breaths/min): 18 Body Mass Index (BMI): 29.8 Blood Pressure (mmHg): 134/78 Capillary Blood Glucose (mg/dl): 138 Reference Range: 80 - 120 mg / dl Electronic Signature(s) Signed: 08/19/2022 1:48:23 PM By: [name] CHT EMT BS , , Entered By: [name] on 08/19/2022 13:48:22

## 2022-08-21 ENCOUNTER — Encounter (HOSPITAL_BASED_OUTPATIENT_CLINIC_OR_DEPARTMENT_OTHER): Payer: Medicare HMO | Admitting: Internal Medicine

## 2022-08-21 DIAGNOSIS — E11621 Type 2 diabetes mellitus with foot ulcer: Secondary | ICD-10-CM | POA: Diagnosis not present

## 2022-08-21 LAB — GLUCOSE, CAPILLARY
Glucose-Capillary: 100 mg/dL — ABNORMAL HIGH (ref 70–99)
Glucose-Capillary: 117 mg/dL — ABNORMAL HIGH (ref 70–99)
Glucose-Capillary: 85 mg/dL (ref 70–99)
Glucose-Capillary: 125 mg/dL — ABNORMAL HIGH (ref 70–99)

## 2022-08-21 NOTE — Progress Notes (Signed)
[name] ([id number]) [id number] Page 1 of 15 Visit Report for 08/20/2022 Chief Complaint Document Details Patient Name: Date of Service: [name], [name]. 08/20/2022 10:15 [name] Medical Record Number: [id number] Patient Account Number: [id number] Date of Birth/Sex: Treating RN: [birth year] (67 y.o. [name]) Primary Care Provider: [name] Other Clinician: Referring Provider: Treating Provider/Extender: [name], [name] in Treatment: 18 Information Obtained from: Patient Chief Complaint 10/16/20; patient is here with [name] substantial wound on his right foot in the setting of [name] recent transmetatarsal amputation. 04/14/2022; referral from podiatry for postsurgical wound of nonhealing partial left fifth ray amputation, right foot (07/16/22): TMA dehiscence Electronic Signature(s) Signed: 08/20/2022 12:26:40 PM By: [name] DO Entered By: [name] on 08/20/2022 11:36:47 -------------------------------------------------------------------------------- Cellular or Tissue Based Product Details Patient Name: Date of Service: [name], [name]. 08/20/2022 10:15 [name] Medical Record Number: [id number] Patient Account Number: [id number] Date of Birth/Sex: Treating RN: 1956-04-07 (67 y.o. [name] Primary Care Provider: [name] Other Clinician: Referring Provider: Treating Provider/Extender: [name] in Treatment: 18 Cellular or Tissue Based Product Type Wound #5 Left,Lateral Foot Applied to: Performed By: Physician [name], DO Cellular or Tissue Based Product Type: Grafix prime Level of Consciousness (Pre-procedure): Awake and Alert Pre-procedure Verification/Time Out Yes - 11:10 Taken: Location: genitalia / hands / feet / multiple digits Wound Size (sq cm): 1.2 Product Size (sq cm): 4 Waste Size (sq cm): 2 Waste Reason: wound size Amount of Product Applied (sq cm):  2 Instrument Used: Forceps, Scissors Lot #: [id number] Order #: 11 Expiration Date: 12/23/2023 Fenestrated: No Reconstituted: Yes Solution Type: normal saline Solution Amount: 20m Lot #: [id number] Expiration Date: 12/03/2024 Secured: Yes Secured With: Steri-Strips, adaptic Dressing Applied: No Procedural Pain: 0 Post Procedural Pain: 0 Response to Treatment: Procedure was tolerated well [name] ([id number] Page 2 of 15 Level of Consciousness (Post- Awake and Alert procedure): Post Procedure Diagnosis Same as Pre-procedure Electronic Signature(s) Signed: 08/20/2022 12:26:40 PM By: HKalman ShanDO Signed: 08/20/2022 4:32:13 PM By: DDeon PillingRN, [name] Entered By: DDeon Pillingon 08/20/2022 11:22:51 -------------------------------------------------------------------------------- Cellular or Tissue Based Product Details Patient Name: Date of Service: [name]. 08/20/2022 10:15 [name] Medical Record Number: [id number] Account Number: [id number] of Birth/Sex: Treating RN: 111-Jan-1957(67y.o. MHessie DienerPrimary Care Provider: BRoselee NovaOther Clinician: Referring Provider: Treating Provider/Extender: HBurgess Amorin Treatment: 18 Cellular or Tissue Based Product Type Wound #6 Right Amputation Site - Transmetatarsal Applied to: Performed By: Physician HKalman Shan DO Cellular or Tissue Based Product Type: Grafix prime Level of Consciousness (Pre-procedure): Awake and Alert Pre-procedure Verification/Time Out Yes - 11:10 Taken: Location: genitalia / hands / feet / multiple digits Wound Size (sq cm): 0.1 Product Size (sq cm): 2 Waste Size (sq cm): 0 Amount of Product Applied (sq cm): 2 Instrument Used: Forceps, Scissors Lot #: [id number] #: 11 Expiration Date: 12/23/2023 Fenestrated: No Reconstituted: Yes Solution Type: normal saline Solution  Amount: 130mLot #: [id number] Expiration Date: 12/03/2024 Secured: Yes Secured With: Steri-Strips, adaptic Dressing Applied: No Procedural Pain: 0 Post Procedural Pain: 0 Response to Treatment: Procedure was tolerated well Level of Consciousness (Post- Awake and Alert procedure): Post Procedure Diagnosis Same as Pre-procedure Electronic Signature(s) Signed: 08/20/2022 12:26:40 PM By: HoKalman ShanO Signed: 08/20/2022 4:32:13 PM By: DeDeon PillingN, [name] Entered By: DeDeon Pillingn 08/20/2022 11:30:12 TAPeri Jefferson  [name] ([id number] Page 3 of 15 -------------------------------------------------------------------------------- Debridement Details Patient Name: Date of Service: [name], [name]. 08/20/2022 10:15 [name] Medical Record Number: [id number] Patient Account Number: [id number] Date of Birth/Sex: Treating RN: 08-01-1955 (67 y.o. [name], [name] Primary Care Provider: [name] Other Clinician: Referring Provider: Treating Provider/Extender: [name] in Treatment: 18 Debridement Performed for Assessment: Wound #5 Left,Lateral Foot Performed By: Physician [name], DO Debridement Type: Debridement Severity of Tissue Pre Debridement: Fat layer exposed Level of Consciousness (Pre-procedure): Awake and Alert Pre-procedure Verification/Time Out Yes - 11:05 Taken: Start Time: 11:06 Pain Control: Lidocaine 5% topical ointment T Area Debrided (L x W): otal 3 (cm) x 0.4 (cm) = 1.2 (cm) Tissue and other material debrided: Viable, Non-Viable, Slough, Subcutaneous, Slough Level: Skin/Subcutaneous Tissue Debridement Description: Excisional Instrument: Curette Bleeding: Minimum Hemostasis Achieved: Pressure End Time: 11:09 Procedural Pain: 0 Post Procedural Pain: 0 Response to Treatment: Procedure was tolerated well Level of Consciousness (Post- Awake and Alert procedure): Post  Debridement Measurements of Total Wound Length: (cm) 3 Width: (cm) 0.4 Depth: (cm) 0.2 Volume: (cm) 0.188 Character of Wound/Ulcer Post Debridement: Improved Severity of Tissue Post Debridement: Fat layer exposed Post Procedure Diagnosis Same as Pre-procedure Electronic Signature(s) Signed: 08/20/2022 12:26:40 PM By: [name] DO Signed: 08/20/2022 4:32:13 PM By: [name] RN, [name] Entered By: [name] on 08/20/2022 11:18:05 -------------------------------------------------------------------------------- Debridement Details Patient Name: Date of Service: [name], [name]. 08/20/2022 10:15 [name] Medical Record Number: [id number] Patient Account Number: [id number] Date of Birth/Sex: Treating RN: [birth year] (67 y.o. [name] Primary Care Provider: [name] Other Clinician: Referring Provider: Treating Provider/Extender: [name] in Treatment: 18 Debridement Performed for Assessment: Wound #6 Right Amputation Site - Transmetatarsal Performed By: Physician [name], DO Debridement Type: Debridement Severity of Tissue Pre Debridement: Fat layer exposed Level of Consciousness (Pre-procedure): Awake and Alert Pre-procedure Verification/Time Out Yes - 11:05 Taken: Start Time: 11:06 Pain Control: Lidocaine 5% topical ointment T Area Debrided (L x W): otal 0.5 (cm) x 0.2 (cm) = 0.1 (cm) Tissue and other material debrided: Viable, Non-Viable, Callus, Slough, Subcutaneous, Slough Level: Skin/Subcutaneous Tissue ADRIENE, AKES [name] ([id number] Page 4 of 15 Debridement Description: Excisional Instrument: Curette Bleeding: Minimum Hemostasis Achieved: Pressure End Time: 11:09 Procedural Pain: 0 Post Procedural Pain: 0 Response to Treatment: Procedure was tolerated well Level of Consciousness (Post- Awake and Alert procedure): Post Debridement Measurements of Total Wound Length: (cm)  0.5 Width: (cm) 0.2 Depth: (cm) 0.5 Volume: (cm) 0.039 Character of Wound/Ulcer Post Debridement: Improved Severity of Tissue Post Debridement: Fat layer exposed Post Procedure Diagnosis Same as Pre-procedure Electronic Signature(s) Signed: 08/20/2022 12:26:40 PM By: [name] DO Signed: 08/20/2022 4:32:13 PM By: [name] RN, [name] Entered By: [name] on 08/20/2022 11:19:28 -------------------------------------------------------------------------------- HPI Details Patient Name: Date of Service: [name], [name]. 08/20/2022 10:15 [name] Medical Record Number: [id number] Patient Account Number: [id number] Date of Birth/Sex: Treating RN: 09-17-1955 (67 y.o. [name]) Primary Care Provider: [name] Other Clinician: Referring Provider: Treating Provider/Extender: [name], [name] in Treatment: 18 History of Present Illness HPI Description: ADMISSION 10/16/20 This is [name] 67 year old man who is [name] type II diabetic. Initially seen by Dr. Unk Lightning of vein and vascular on 09/10/2021 for bilateral lower extremity rest pain right greater than left. His ABIs demonstrated monophasic waveforms at the ankles bilaterally with depressed toe pressures. His ABI in the right was 0.5 and on  the left 0.28. There were no obtainable waveforms for TBI's. He underwent angiography on 09/10/2021 and had findings of severe PAD with 95% stenosis of the distal SFA. He also had peroneal and posterior tibial arteries occluded with significant collateralization in the calf there was reconstitution of the posterior tibial artery in the distal third of the calf. Ostia of the anterior tibial artery was not appreciated. He underwent [name] angioplasty of the superficial femoral artery. He required admission to hospital from 09/12/2021 through 09/22/2021 with right foot cellulitis and sepsis. On 3/13 he underwent [name] right below-knee popliteal to posterior tibial bypass using [name] greater saphenous vein.  Ultimately however he required [name] right TMA by podiatry which I believe was done on 3/17 for progressive diabetic foot infection. He was discharged to [facility] skilled facility. He arrives in clinic today with [name] large wound area over the entirety of his amputation site extending proximally. The attempted flap closure of the amputation site and his TMA has failed and the wound extends under the attempt to closure. Still some sutures in place. There is an odor but he is not systemically unwell. He is due for follow-up arterial studies and has an appointment with vascular surgery on 10/28/2021. They are using wet-to-dry dressings at [facility]. Past medical history includes type 2 diabetes with peripheral neuropathy, hypertension, obstructive sleep apnea 4/20; patient presents for follow-up. He is being discharged from his facility at [facility] today. They have been doing Dakin's wet-to-dry dressings. He states that his fiance can help with dressing changes. He is getting [facility] home [facility] to come out 3 times [name] week once he leaves the facility. He is scheduled to see vein and vascular on 5/12. He has not seen the wound himself. He puts covers over his face for wound exams. He currently denies systemic signs of infection. 4/28; patient admitted to the clinic 2 weeks ago with [name] dehisced right TMA in the setting of type 2 diabetes with significant PAD. He is status post revascularization. He has been discharged from the nursing home he was at and now is at home using Dakin's wet-to-dry dressings daily he has home health. He denies any systemic signs of infection 5/4; patient presents for follow-up. His fiance and home health have been changing his dressings. He currently denies systemic signs of infection. He continues to put sheet covers over his face during our wound encounters because he does not want [name] look at the wound. 5/25; Patient presents for follow-up. He missed his last clinic appointment. He had  [name] bone biopsy and culture done at last clinic visit that showed acute osteomyelitis with growth of E. coli, stenotrophomonas maltophilia and enteroccocus faecalis. [name] referral to infectious disease was made. He has not heard from them yet. He has been using Dakin's wet-to-dry dressings. He is now more comfortable with looking at the wound bed. [name], [name] ([id number]) [id number] Page 5 of 15 6/6; the patient comes into clinic today with 2 wounds on the left foot which apparently have been there for several months. This was not known to assess but was known by Dr. Donzetta Matters. In fact he underwent an angiogram yesterday. He had [name] laser atherectomy of the left posterior tibial artery with [name] 3 mm balloon angioplasty, also [name] stent of the last left SFA. 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 This is [name] lot better than the last time I saw it. He has been using Dakin's wet-to-dry. He has an appointment with infectious disease for [name] bone biopsy which showed E. coli, stenotrophomonas and Enterococcus faecalis. The appointment is for tomorrow 04/14/2022 Mr. [name] is an uncontrolled type 2 diabetic on insulin that presents with [name] left foot ulcer. On 12/18/2021 he required [name] left fifth ray amputation. He had revision of this site on 6/17 by Dr. Posey Pronto, podiatry. He states he has had [name] wound VAC on this area for the past 3 weeks. It is unclear what the prior wound care has been. He has [name] history of osteomyelitis to this area and was treated with IV and oral antibiotics For 6 weeks by Dr. Linus Salmons of infectious disease. At his last follow-up on 02/20/2022 his CRP was trending down and antibiotics were stopped. He also has [name] history of peripheral arterial disease status post stenting to the left SFA and balloon angioplasty to  the left posterior tibial artery On 12/2021. He has follow-up with vein and vascular at the end of the month. He also has [name] wound to the dorsal aspect of the left foot that he has been placing Betadine on. 10/19; patient presents for follow-up. He had [name] bone biopsy of the left foot at last clinic visit showed osteomyelitis. The bone culture showed Enterobacter cloacae, Enterococcus faecalis, Staphylococcus aureus, viridans group strep, actinotigum schaelii. He has been referred to infectious disease And has an appointment on October 25. He has been using Dakin's wet-to-dry dressing to the lateral left foot wound and Hydrofera Blue and Medihoney to the dorsal foot wound. He has no issues or complaints today. He denies systemic signs of infection. 10/26; patient presents for follow-up. He saw Dr. Linus Salmons yesterday with infectious disease and has been prescribed doxycycline and Levaquin for his chronic osteomyelitis. He has been using Dakin's wet-to-dry dressings to the lateral left foot wound and Medihoney and Hydrofera Blue to the dorsal wound. We discussed [name] skin graft for the dorsal foot wound and he would like to proceed with insurance verification. 11/3; patient with [name] wound on the dorsal left foot and [name] substantial postsurgical area on the lateral left foot fifth ray amputation. He is also followed by Dr. Alton Revere of infectious disease. He had [name] bone biopsy from the left lateral foot that showed acute osteomyelitis. Swab culture showed multiple organisms. He has also been revascularized by infectious disease status post left SFA stenting and tibial laser atherectomy and angioplasty. An MRI of the left foot had previously shown osteomyelitis of the fifth met head we have been using Dakin's wet-to-dry to the left lateral foot and Medihoney and Hydrofera Blue to the wound on the dorsal foot. 11/13; HBO treatment was discussed at last clinic visit and patient would like to proceed with this. He has  orientation today. He has been approved for Grafix. We have been using Hydrofera Blue and Medihoney to the left dorsal foot and collagen to the left lateral foot. Patient has home health that comes in for dressing changes. He currently has no issues or complaints today. 12/7; patient missed his last clinic appointment. He was last seen 3 weeks ago. He is currently off of antibiotics per ID. He completed [name] prolonged treatment. He has been using collagen to the lateral foot wound. He has been using Medihoney and Hydrofera Blue to the dorsal foot wound. He has been doing well with hyperbaric oxygen therapy. He has  no issues or complaints today. He denies signs of of infection. 12/14; patient presents for follow-up. We have been using Grafix to the wound beds. He has had trouble equalizing the pressure in his ears during HBO treatments. It was recommended he follow-up with ENT He does not have an appointment yet. He currently denies signs of infection. . 12/21; patient presents for follow-up. He was seen by ENT and had tubes placed. He is continued HBO without issues. We have been using Grafix to the wound beds. There is been improvement in wound healing. 12/21; patient presents for follow-up. He has no issues or complaints today. There is been improvement in wound healing. 1/5; patient presents for follow-up. At last clinic visit Grafix was placed in standard fashion. He has no issues or complaints today. He is tolerating hyperbaric oxygen therapy well. He denies signs of infection. 1/11; patient presents for follow-up. We have been placing Grafix to the wound beds on the left foot. The dorsal left foot wound has healed. The left lateral foot wound has shown improvement in healing. Unfortunately has had dehiscence of his previous surgical wound site on the right foot. He currently denies signs of infection. 1/19; patient presents for follow-up. Grafix was placed to the left foot wound at last clinic  visit. He has been using Dakin's wet-to-dry dressings to the right foot wound. He continues HBO therapy without issues. He has shown improvement in wound healing to the left foot. 1/26; patient presents for follow-up. We been placing Grafix to the left foot wound and patient has been using Dakin's wet-to-dry dressings to the right foot wound. There is been improvement in wound healing. He continues HBO therapy without issues. 2/2; patient presents for follow-up. We have been using Grafix to the left foot wound and Dakin's wet-to-dry dressings to the right foot wound. He has no issues or complaints today. 2/8; patient presents for follow-up. We have been using Grafix to the left foot wound and Dakin's wet-to-dry dressings to the right foot wound. He has no issues or complaints today. He continues HBO without issues. 2/15; patient presents for follow-up. We have been using Grafix to the wound beds. He continues HBO without issues. He has been offloading the wound beds with his surgical shoe. Electronic Signature(s) Signed: 08/20/2022 12:26:40 PM By: [name] DO Entered By: [name] on 08/20/2022 11:37:20 -------------------------------------------------------------------------------- Physical Exam Details Patient Name: Date of Service: [name], [name]. 08/20/2022 10:15 [name] Medical Record Number: [id number] Patient Account Number: [id number] Date of Birth/Sex: Treating RN: [birth year] (67 y.o. [name] ([id number] Page 6 of 15 Primary Care Provider: [name] Other Clinician: Referring Provider: Treating Provider/Extender: [name], [name] in Treatment: 18 Constitutional respirations regular, non-labored and within target range for patient.. Cardiovascular 2+ dorsalis pedis/posterior tibialis pulses. Psychiatric pleasant and cooperative. Notes Left foot: T the lateral aspect there is an open  wound with granulation tissue and nonviable tissue. No probing to bone . Right foot: Along the previous TMA o site there is an open wound with nonviable tissue, granulation tissue and callus. There is increased depth In the center. Does not probe to bone. No signs of surrounding soft tissue infection. Electronic Signature(s) Signed: 08/20/2022 12:26:40 PM By: [name] DO Entered By: [name] on 08/20/2022 11:37:58 -------------------------------------------------------------------------------- Physician Orders Details Patient Name: Date of Service: [name], [place] [name] 08/20/2022 10:15 [name] Medical Record Number: [id number] Patient Account Number: [id number] Date of Birth/Sex: Treating RN:  05/05/1956 (67 y.o. [name] Primary Care Provider: [name] Other Clinician: Referring Provider: Treating Provider/Extender: [name] in Treatment: [phone] Verbal / Phone Orders: No Diagnosis Coding ICD-10 Coding Code Description [phone] Non-pressure chronic ulcer of other part of left foot with fat layer exposed M86.172 Other acute osteomyelitis, left ankle and foot L97.528 Non-pressure chronic ulcer of other part of left foot with other specified severity E11.621 Type 2 diabetes mellitus with foot ulcer E11.42 Type 2 diabetes mellitus with diabetic polyneuropathy S91.301A Unspecified open wound, right foot, initial encounter 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 severity Follow-up Appointments ppointment in 1 week. - w/ Dr. Heber Meeker Thursday 08/27/2022 1015 overflow Return [name] ppointment in 2 weeks. - Dr .Heber Montrose Thursday after Hyperbaric 1015 overflow Return [name] Other: - ****ONLY CHANGE OUTTER DRESSINGS ONLY.*** Anesthetic (In clinic) Topical Lidocaine 5% applied to wound bed Cellular or Tissue Based Products Cellular or Tissue Based Product Type: - Run IVR for Grafix for left dorsal foot-  pending Grafix # 1 applied 06/11/22 Grafix # 2 applied 06/18/22 Grafix # 3 applied 06/25/22 GRafix # 4 applied 07/02/22 Grafix # 5 applied 07/10/22 Grafix # 6 applied 07/17/22 Grafix # 7 applied 07/24/22---Also, go ahead and run IVR for more Grafix for left lateral foot Grafix #8 applied 07/31/2022 Grafix # 9 applied 08/07/22] left foot Grafix #10 applied 08/13/2022 left and right foot wounds today split the grafix in half. Graftx #11 applied to both wounds. Patient right foot approved for total of 12 between dates 08/18/2022-11/04/2022. Cellular or Tissue Based Product applied to wound bed, secured with steri-strips, cover with Adaptic or Mepitel. (DO NOT REMOVE). JAMANE, DRAYTON [name] ([id number]) [id number] Page 7 of 15 Edema Control - Lymphedema / SCD / Other Avoid standing for long periods of time. Off-Loading Open toe surgical shoe to: - wear when standing or walking. Hyperbaric Oxygen Therapy Evaluate for HBO Therapy - 07/24/22- Pt. to finish next 8 HBO treatments 07/24/22- DR. Latrisha Coiro wants pt. to be extended for 40 more treatments Indication: - Wagner Grade III to left lateral foot 2.5 ATA for 90 Minutes with 2 Five (5) Minute [name] Breaks ir Total Number of Treatments: - 40; extend for 40 more= 80 treatments One treatments per day (delivered Monday through Friday unless otherwise specified in Special Instructions below): Finger stick Blood Glucose Pre- and Post- HBOT Treatment. Follow Hyperbaric Oxygen Glycemia Protocol Afrin (Oxymetazoline HCL) 0.05% nasal spray - 1 spray in both nostrils daily as needed prior to HBO treatment for difficulty clearing ears Wound Treatment Wound #5 - Foot Wound Laterality: Left, Lateral Cleanser: Wound Cleanser (Generic) 1 x Per Day/30 Days Discharge Instructions: Cleanse the wound with wound cleanser prior to applying [name] clean dressing using gauze sponges, not tissue or cotton balls. [name]-Wound Care: Skin Prep (Generic) 1 x  Per Day/30 Days Discharge Instructions: Use skin prep as directed Prim Dressing: Grafix #11 (Generic) 1 x Per Day/30 Days ary Discharge Instructions: applied by provider. Prim Dressing: adaptic and steri-strips 1 x Per Day/30 Days ary Discharge Instructions: to secure grafix to wound bed. leave in place. Secondary Dressing: ABD Pad, 8x10 (Generic) 1 x Per Day/30 Days Discharge Instructions: Apply over primary dressing as directed. Secured With: [employer], 4.5x3.1 (in/yd) (Generic) 1 x Per Day/30 Days Discharge Instructions: Secure with Kerlix as directed. Secured With: 50M Medipore H Soft Cloth Surgical T ape, 4 x 10 (in/yd) (Generic) 1 x Per Day/30 Days Discharge Instructions: Secure with tape  as directed. Wound #6 - Amputation Site - Transmetatarsal Wound Laterality: Right Cleanser: Wound Cleanser (Generic) 1 x Per Day/30 Days Discharge Instructions: Cleanse the wound with wound cleanser prior to applying [name] clean dressing using gauze sponges, not tissue or cotton balls. [name]-Wound Care: Skin Prep (Generic) 1 x Per Day/30 Days Discharge Instructions: Use skin prep as directed Prim Dressing: Grafix #10 (Generic) 1 x Per Day/30 Days ary Discharge Instructions: applied by provider. Prim Dressing: adaptic and steri-strips 1 x Per Day/30 Days ary Discharge Instructions: to secure grafix to wound bed. leave in place. Prim Dressing: cutimed sorbact 1 x Per Day/30 Days ary Discharge Instructions: lightly pack into wound bed behind the grafix. Secondary Dressing: ABD Pad, 8x10 (Generic) 1 x Per Day/30 Days Discharge Instructions: Apply over primary dressing as directed. Secured With: [employer], 4.5x3.1 (in/yd) (Generic) 1 x Per Day/30 Days Discharge Instructions: Secure with Kerlix as directed. Secured With: 89M Medipore H Soft Cloth Surgical T ape, 4 x 10 (in/yd) (Generic) 1 x Per Day/30 Days Discharge Instructions: Secure with tape as directed. GLYCEMIA INTERVENTIONS  PROTOCOL PRE-HBO GLYCEMIA INTERVENTIONS ACTION INTERVENTION Obtain pre-HBO capillary blood glucose (ensure 1 physician order is in chart). [name]. Notify HBO physician and await physician orders. 2 If result is 70 mg/dl or below: B. If the result meets the hospital definition of [name] critical result, follow hospital policy. [name]. Give patient an 8 ounce Glucerna Shake, an 8 ounce Ensure, or 8 ounces of [name] Glucerna/Ensure equivalent dietary supplement*. MARKANDREW, SCHNICK [name] ([id number]) [id number] Page 8 of 15 B. Wait 30 minutes. If result is 71 mg/dl to 130 mg/dl: C. Retest patients capillary blood glucose (CBG). D. If result greater than or equal to 110 mg/dl, proceed with HBO. If result less than 110 mg/dl, notify HBO physician and consider holding HBO. If result is 131 mg/dl to 249 mg/dl: [name]. Proceed with HBO. [name]. Notify HBO physician and await physician orders. B. It is recommended to hold HBO and do If result is 250 mg/dl or greater: blood/urine ketone testing. C. If the result meets the hospital definition of [name] critical result, follow hospital policy. POST-HBO GLYCEMIA INTERVENTIONS ACTION INTERVENTION Obtain post HBO capillary blood glucose (ensure 1 physician order is in chart). [name]. Notify HBO physician and await physician orders. 2 If result is 70 mg/dl or below: B. If the result meets the hospital definition of [name] critical result, follow hospital policy. [name]. Give patient an 8 ounce Glucerna Shake, an 8 ounce Ensure, or 8 ounces of [name] Glucerna/Ensure equivalent dietary supplement*. B. Wait 15 minutes for symptoms of If result is 71 mg/dl to 100 mg/dl: hypoglycemia (i.e. nervousness, anxiety, sweating, chills, clamminess, irritability, confusion, tachycardia or dizziness). C. If patient asymptomatic, discharge patient. If patient symptomatic, repeat capillary blood glucose (CBG) and notify HBO physician. If result is 101 mg/dl to 249 mg/dl:  [name]. Discharge patient. [name]. Notify HBO physician and await physician orders. B. It is recommended to do blood/urine ketone If result is 250 mg/dl or greater: testing. C. If the result meets the hospital definition of [name] critical result, follow hospital policy. *Juice or candies are NOT equivalent products. If patient refuses the Glucerna or Ensure, please consult the hospital dietitian for an appropriate substitute. Electronic Signature(s) Signed: 08/20/2022 12:26:40 PM By: [name] DO Entered By: [name] on 08/20/2022 11:38:09 -------------------------------------------------------------------------------- Problem List Details Patient Name: Date of Service: [name], [name]. 08/20/2022 10:15 [name] Medical Record Number: [id number] Patient Account Number: [id number] Date of  Birth/Sex: Treating RN: Nov 04, 1955 (67 y.o. [name] Primary Care Provider: [name] Other Clinician: Referring Provider: Treating Provider/Extender: [name] in Treatment: 18 Active Problems ICD-10 Encounter Code Description Active Date MDM Diagnosis [id number] Non-pressure chronic ulcer of other part of left foot with fat layer exposed 04/14/2022 No Yes M86.172 Other acute osteomyelitis, left ankle and foot 05/08/2022 No Yes L97.528 Non-pressure chronic ulcer of other part of left foot with other specified 04/14/2022 No Yes severity KENNON, SEMEDO [name] ([id number]) [id number] Page 9 of 15 E11.621 Type 2 diabetes mellitus with foot ulcer 04/14/2022 No Yes E11.42 Type 2 diabetes mellitus with diabetic polyneuropathy 04/14/2022 No Yes S91.301A Unspecified open wound, right foot, initial encounter 07/16/2022 No Yes 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 07/16/2022 No Yes severity Inactive Problems Resolved Problems Electronic Signature(s) Signed: 08/20/2022 12:26:40 PM By: [name]  DO Entered By: [name] on 08/20/2022 11:36:27 -------------------------------------------------------------------------------- Progress Note Details Patient Name: Date of Service: [name], [name]. 08/20/2022 10:15 [name] Medical Record Number: [id number] Patient Account Number: [id number] Date of Birth/Sex: Treating RN: 1956-06-26 (67 y.o. [name]) Primary Care Provider: [name] Other Clinician: Referring Provider: Treating Provider/Extender: [name], [name] in Treatment: 18 Subjective Chief Complaint Information obtained from Patient 10/16/20; patient is here with [name] substantial wound on his right foot in the setting of [name] recent transmetatarsal amputation. 04/14/2022; referral from podiatry for postsurgical wound of nonhealing partial left fifth ray amputation, right foot (07/16/22): TMA dehiscence History of Present Illness (HPI) ADMISSION 10/16/20 This is [name] 67 year old man who is [name] type II diabetic. Initially seen by Dr. Unk Lightning of vein and vascular on 09/10/2021 for bilateral lower extremity rest pain right greater than left. His ABIs demonstrated monophasic waveforms at the ankles bilaterally with depressed toe pressures. His ABI in the right was 0.5 and on the left 0.28. There were no obtainable waveforms for TBI's. He underwent angiography on 09/10/2021 and had findings of severe PAD with 95% stenosis of the distal SFA. He also had peroneal and posterior tibial arteries occluded with significant collateralization in the calf there was reconstitution of the posterior tibial artery in the distal third of the calf. Ostia of the anterior tibial artery was not appreciated. He underwent [name] angioplasty of the superficial femoral artery. He required admission to hospital from 09/12/2021 through 09/22/2021 with right foot cellulitis and sepsis. On 3/13 he underwent [name] right below-knee popliteal to posterior tibial bypass using [name] greater saphenous vein. Ultimately however he  required [name] right TMA by podiatry which I believe was done on 3/17 for progressive diabetic foot infection. He was discharged to [facility] skilled facility. He arrives in clinic today with [name] large wound area over the entirety of his amputation site extending proximally. The attempted flap closure of the amputation site and his TMA has failed and the wound extends under the attempt to closure. Still some sutures in place. There is an odor but he is not systemically unwell. He is due for follow-up arterial studies and has an appointment with vascular surgery on 10/28/2021. They are using wet-to-dry dressings at [facility]. Past medical history includes type 2 diabetes with peripheral neuropathy, hypertension, obstructive sleep apnea 4/20; patient presents for follow-up. He is being discharged from his facility at [facility] today. They have been doing Dakin's wet-to-dry dressings. He states that his fiance can help with dressing changes. He is getting [facility] to come out 3 times [name]  week once he leaves the facility. He is scheduled to see vein and vascular on 5/12. He has not seen the wound himself. He puts covers over his face for wound exams. He currently denies systemic signs of infection. 4/28; patient admitted to the clinic 2 weeks ago with [name] dehisced right TMA in the setting of type 2 diabetes with significant PAD. He is status post revascularization. He has been discharged from the nursing home he was at and now is at home using Dakin's wet-to-dry dressings daily he has home health. He denies any systemic signs of infection 5/4; patient presents for follow-up. His fiance and home health have been changing his dressings. He currently denies systemic signs of infection. He [name], [name] ([id number]) [id number] Page 10 of 15 continues to put sheet covers over his face during our wound encounters because he does not want [name] look at the wound. 5/25; Patient  presents for follow-up. He missed his last clinic appointment. He had [name] bone biopsy and culture done at last clinic visit that showed acute osteomyelitis with growth of E. coli, stenotrophomonas maltophilia and enteroccocus faecalis. [name] referral to infectious disease was made. He has not heard from them yet. He has been using Dakin's wet-to-dry dressings. He is now more comfortable with looking at the wound bed. 6/6; the patient comes into clinic today with 2 wounds on the left foot which apparently have been there for several months. This was not known to assess but was known by Dr. Donzetta Matters. In fact he underwent an angiogram yesterday. He had [name] laser atherectomy of the left posterior tibial artery with [name] 3 mm balloon angioplasty, also [name] stent of the last left SFA. 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 This is [name] lot better than the last time I saw it. He has been using Dakin's wet-to-dry. He has an appointment with infectious disease for [name] bone biopsy which showed E. coli, stenotrophomonas and Enterococcus faecalis. The appointment is for tomorrow 04/14/2022 Mr. Erika Wooster [name] is an uncontrolled type 2 diabetic on insulin that presents with [name] left foot ulcer. On 12/18/2021 he required [name] left fifth ray amputation. He had revision of this site on 6/17 by Dr. Posey Pronto, podiatry. He states he has had [name] wound VAC on this area for the past 3 weeks. It is unclear what the prior wound care has been. He has [name] history of osteomyelitis to this area and was treated with IV and oral antibiotics For 6 weeks by Dr. Linus Salmons of infectious disease. At his last follow-up on 02/20/2022 his CRP was trending down and antibiotics were stopped. He also has [name] history of peripheral arterial disease status post stenting to the left SFA and balloon angioplasty to the left  posterior tibial artery On 12/2021. He has follow-up with vein and vascular at the end of the month. He also has [name] wound to the dorsal aspect of the left foot that he has been placing Betadine on. 10/19; patient presents for follow-up. He had [name] bone biopsy of the left foot at last clinic visit showed osteomyelitis. The bone culture showed Enterobacter cloacae, Enterococcus faecalis, Staphylococcus aureus, viridans group strep, actinotigum schaelii. He has been referred to infectious disease And has an appointment on October 25. He has been using Dakin's wet-to-dry dressing to the lateral left  foot wound and Hydrofera Blue and Medihoney to the dorsal foot wound. He has no issues or complaints today. He denies systemic signs of infection. 10/26; patient presents for follow-up. He saw Dr. Linus Salmons yesterday with infectious disease and has been prescribed doxycycline and Levaquin for his chronic osteomyelitis. He has been using Dakin's wet-to-dry dressings to the lateral left foot wound and Medihoney and Hydrofera Blue to the dorsal wound. We discussed [name] skin graft for the dorsal foot wound and he would like to proceed with insurance verification. 11/3; patient with [name] wound on the dorsal left foot and [name] substantial postsurgical area on the lateral left foot fifth ray amputation. He is also followed by Dr. Alton Revere of infectious disease. He had [name] bone biopsy from the left lateral foot that showed acute osteomyelitis. Swab culture showed multiple organisms. He has also been revascularized by infectious disease status post left SFA stenting and tibial laser atherectomy and angioplasty. An MRI of the left foot had previously shown osteomyelitis of the fifth met head we have been using Dakin's wet-to-dry to the left lateral foot and Medihoney and Hydrofera Blue to the wound on the dorsal foot. 11/13; HBO treatment was discussed at last clinic visit and patient would like to proceed with this. He has orientation today.  He has been approved for Grafix. We have been using Hydrofera Blue and Medihoney to the left dorsal foot and collagen to the left lateral foot. Patient has home health that comes in for dressing changes. He currently has no issues or complaints today. 12/7; patient missed his last clinic appointment. He was last seen 3 weeks ago. He is currently off of antibiotics per ID. He completed [name] prolonged treatment. He has been using collagen to the lateral foot wound. He has been using Medihoney and Hydrofera Blue to the dorsal foot wound. He has been doing well with hyperbaric oxygen therapy. He has no issues or complaints today. He denies signs of of infection. 12/14; patient presents for follow-up. We have been using Grafix to the wound beds. He has had trouble equalizing the pressure in his ears during HBO treatments. It was recommended he follow-up with ENT He does not have an appointment yet. He currently denies signs of infection. . 12/21; patient presents for follow-up. He was seen by ENT and had tubes placed. He is continued HBO without issues. We have been using Grafix to the wound beds. There is been improvement in wound healing. 12/21; patient presents for follow-up. He has no issues or complaints today. There is been improvement in wound healing. 1/5; patient presents for follow-up. At last clinic visit Grafix was placed in standard fashion. He has no issues or complaints today. He is tolerating hyperbaric oxygen therapy well. He denies signs of infection. 1/11; patient presents for follow-up. We have been placing Grafix to the wound beds on the left foot. The dorsal left foot wound has healed. The left lateral foot wound has shown improvement in healing. Unfortunately has had dehiscence of his previous surgical wound site on the right foot. He currently denies signs of infection. 1/19; patient presents for follow-up. Grafix was placed to the left foot wound at last clinic visit. He has been  using Dakin's wet-to-dry dressings to the right foot wound. He continues HBO therapy without issues. He has shown improvement in wound healing to the left foot. 1/26; patient presents for follow-up. We been placing Grafix to the left foot wound and patient has been using Dakin's wet-to-dry dressings to the  right foot wound. There is been improvement in wound healing. He continues HBO therapy without issues. 2/2; patient presents for follow-up. We have been using Grafix to the left foot wound and Dakin's wet-to-dry dressings to the right foot wound. He has no issues or complaints today. 2/8; patient presents for follow-up. We have been using Grafix to the left foot wound and Dakin's wet-to-dry dressings to the right foot wound. He has no issues or complaints today. He continues HBO without issues. 2/15; patient presents for follow-up. We have been using Grafix to the wound beds. He continues HBO without issues. He has been offloading the wound beds with his surgical shoe. Patient History Information obtained from Patient, Chart. Family History Diabetes - Mother,Siblings, Heart Disease - Siblings, Hypertension - Siblings, Stroke - Mother, No family history of Cancer, Hereditary Spherocytosis, Kidney Disease, Lung Disease, Seizures, Thyroid Problems, Tuberculosis. Social History Former smoker, Marital Status - Single, Alcohol Use - Never, Drug Use - No History, Caffeine Use - Daily. Medical History Eyes Denies history of Cataracts, Glaucoma, Optic Neuritis Respiratory Patient has history of Asthma, Sleep Apnea Denies history of Aspiration, Chronic Obstructive Pulmonary Disease (COPD), Pneumothorax, Tuberculosis [name], [name] ([id number] Page 11 of 15 Cardiovascular Patient has history of Hypertension, Peripheral Venous Disease Denies history of Angina, Arrhythmia, Congestive Heart Failure, Coronary Artery Disease, Deep Vein Thrombosis, Hypotension,  Myocardial Infarction, Peripheral Arterial Disease, Phlebitis, Vasculitis Endocrine Patient has history of Type II Diabetes Denies history of Type I Diabetes Integumentary (Skin) Denies history of History of Burn Musculoskeletal Patient has history of Osteomyelitis Denies history of Gout, Rheumatoid Arthritis, Osteoarthritis Neurologic Patient has history of Neuropathy Denies history of Dementia, Quadriplegia, Paraplegia, Seizure Disorder Hospitalization/Surgery History - 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 Posey Pronto. - aortogram 12/08/2021 Dr. Virl Cagey [facility]. Medical [name] Surgical History Notes nd Psychiatric PTSD Objective Constitutional respirations regular, non-labored and within target range for patient.. Vitals Time Taken: 10:36 AM, Height: 72 in, Weight: 220 lbs, BMI: 29.8, Temperature: 98.3 F, Pulse: 76 bpm, Respiratory Rate: 18 breaths/min, Blood Pressure: 134/74 mmHg, Capillary Blood Glucose: 86 mg/dl. Cardiovascular 2+ dorsalis pedis/posterior tibialis pulses. Psychiatric pleasant and cooperative. General Notes: Left foot: T the lateral aspect there is an open wound with granulation tissue and nonviable tissue. No probing to bone . Right foot: Along the o previous TMA site there is an open wound with nonviable tissue, granulation tissue and callus. There is increased depth In the center. Does not probe to bone. No signs of surrounding soft tissue infection. Integumentary (Hair, Skin) Wound #5 status is Open. Original cause of wound was Surgical Injury. The date acquired was: 12/20/2021. The wound has been in treatment 18 weeks. The wound is located on the Left,Lateral Foot. The wound measures 3cm length x 0.4cm width x 0.2cm depth; 0.942cm^2 area and 0.188cm^3 volume. There is Fat Layer (Subcutaneous Tissue) exposed. There is no tunneling or undermining noted. There is [name] medium amount of serosanguineous drainage noted. The wound margin is epibole. There is  large (67-100%) red, pink granulation within the wound bed. There is [name] small (1-33%) amount of necrotic tissue within the wound bed. The periwound skin appearance had no abnormalities noted for color. The periwound skin appearance exhibited: Callus. The periwound skin appearance did not exhibit: Crepitus, Excoriation, Induration, Rash, Scarring, Dry/Scaly, Maceration. Periwound temperature was noted as No Abnormality. Wound #6 status is Open. Original cause of wound was Gradually Appeared. The date acquired was: 07/16/2022. The wound has been in treatment 5 weeks.  The wound is located on the Right Amputation Site - Transmetatarsal. The wound measures 0.5cm length x 0.2cm width x 0.5cm depth; 0.079cm^2 area and 0.039cm^3 volume. There is Fat Layer (Subcutaneous Tissue) exposed. There is no tunneling or undermining noted. There is [name] medium amount of serosanguineous drainage noted. The wound margin is distinct with the outline attached to the wound base. There is large (67-100%) red granulation within the wound bed. There is no necrotic tissue within the wound bed. The periwound skin appearance exhibited: Callus, Dry/Scaly. The periwound skin appearance did not exhibit: Crepitus, Excoriation, Induration, Rash, Scarring, Maceration, Atrophie Blanche, Cyanosis, Ecchymosis, Hemosiderin Staining, Mottled, Pallor, Rubor, Erythema. Assessment Active Problems ICD-10 Non-pressure chronic ulcer of other part of left foot with fat layer exposed Other acute osteomyelitis, left ankle and foot Non-pressure chronic ulcer of other part of left foot with other specified severity Type 2 diabetes mellitus with foot ulcer Type 2 diabetes mellitus with diabetic polyneuropathy Unspecified open wound, right foot, initial encounter 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 severity Patient's wounds appear well-healing. I debrided nonviable tissue. Grafix was placed in standard fashion. I  recommended continuing to aggressively offload [name], [name] [name] ([id number]) [id number] Page 12 of 15 the wound beds with [name] surgical shoes. Continue HBO therapy. Follow-up in 1 week. Procedures Wound #5 Pre-procedure diagnosis of Wound #5 is [name] Diabetic Wound/Ulcer of the Lower Extremity located on the Left,Lateral Foot .Severity of Tissue Pre Debridement is: Fat layer exposed. There was [name] Excisional Skin/Subcutaneous Tissue Debridement with [name] total area of 1.2 sq cm performed by [name], DO. With the following instrument(s): Curette to remove Viable and Non-Viable tissue/material. Material removed includes Subcutaneous Tissue and Slough and after achieving pain control using Lidocaine 5% topical ointment. [name] time out was conducted at 11:05, prior to the start of the procedure. [name] Minimum amount of bleeding was controlled with Pressure. The procedure was tolerated well with [name] pain level of 0 throughout and [name] pain level of 0 following the procedure. Post Debridement Measurements: 3cm length x 0.4cm width x 0.2cm depth; 0.188cm^3 volume. Character of Wound/Ulcer Post Debridement is improved. Severity of Tissue Post Debridement is: Fat layer exposed. Post procedure Diagnosis Wound #5: Same as Pre-Procedure Pre-procedure diagnosis of Wound #5 is [name] Diabetic Wound/Ulcer of the Lower Extremity located on the Left,Lateral Foot. [name] skin graft procedure using [name] bioengineered skin substitute/cellular or tissue based product was performed by [name], DO with the following instrument(s): Forceps and Scissors. Grafix prime was applied and secured with Steri-Strips and adaptic. 2 sq cm of product was utilized and 2 sq cm was wasted due to wound size. Post Application, no dressing was applied. [name] Time Out was conducted at 11:10, prior to the start of the procedure. The procedure was tolerated well with [name] pain level of 0 throughout and [name] pain level of 0 following the  procedure. Post procedure Diagnosis Wound #5: Same as Pre-Procedure . 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 There was [name] Excisional Skin/Subcutaneous Tissue Debridement with [name] total area of 0.1 sq cm performed by [name], DO. With the following instrument(s): Curette to remove Viable and Non-Viable tissue/material. Material removed includes Callus, Subcutaneous Tissue, and Slough after achieving pain control using Lidocaine 5% topical ointment. [name] time out was conducted at 11:05, prior to the start of  the procedure. [name] Minimum amount of bleeding was controlled with Pressure. The procedure was tolerated well with [name] pain level of 0 throughout and [name] pain level of 0 following the procedure. Post Debridement Measurements: 0.5cm length x 0.2cm width x 0.5cm depth; 0.039cm^3 volume. Character of Wound/Ulcer Post Debridement is improved. Severity of Tissue Post Debridement is: Fat layer exposed. 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 [name] skin graft procedure using [name] bioengineered skin substitute/cellular or tissue based product was performed by [name], DO with the following instrument(s): Forceps and Scissors. Grafix prime was applied and secured with Steri-Strips and adaptic. 2 sq cm of product was utilized and 0 sq cm was wasted. Post Application, no dressing was applied. [name] Time Out was conducted at 11:10, prior to the start of the procedure. The procedure was tolerated well with [name] pain level of 0 throughout and [name] pain level of 0 following the procedure. Post procedure Diagnosis Wound #6: Same as Pre-Procedure . Plan Follow-up Appointments: Return Appointment in 1  week. - w/ Dr. Heber [place] Thursday 08/27/2022 1015 overflow Return Appointment in 2 weeks. - Dr .Heber [place] Thursday after Hyperbaric 1015 overflow Other: - ****ONLY CHANGE OUTTER DRESSINGS ONLY.*** Anesthetic: (In clinic) Topical Lidocaine 5% applied to wound bed Cellular or Tissue Based Products: Cellular or Tissue Based Product Type: - Run IVR for Grafix for left dorsal foot- pending Grafix # 1 applied 06/11/22 Grafix # 2 applied 06/18/22 Grafix # 3 applied 06/25/22 GRafix # 4 applied 07/02/22 Grafix # 5 applied 07/10/22 Grafix # 6 applied 07/17/22 Grafix # 7 applied 07/24/22---Also, go ahead and run IVR for more Grafix for left lateral foot Grafix #8 applied 07/31/2022 Grafix # 9 applied 08/07/22] left foot Grafix #10 applied 08/13/2022 left and right foot wounds today split the grafix in half. Graftx #11 applied to both wounds. Patient right foot approved for total of 12 between dates 08/18/2022-11/04/2022. Cellular or Tissue Based Product applied to wound bed, secured with steri-strips, cover with Adaptic or Mepitel. (DO NOT REMOVE). Edema Control - Lymphedema / SCD / Other: Avoid standing for long periods of time. Off-Loading: Open toe surgical shoe to: - wear when standing or walking. Hyperbaric Oxygen Therapy: Evaluate for HBO Therapy - 07/24/22- Pt. to finish next 8 HBO treatments 07/24/22- DR. Rashad Obeid wants pt. 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 Follow Hyperbaric Oxygen Glycemia Protocol Afrin (Oxymetazoline HCL) 0.05% nasal spray - 1 spray in both nostrils daily as needed prior to HBO treatment for difficulty clearing ears WOUND #5: - Foot Wound  Laterality: Left, Lateral Cleanser: Wound Cleanser (Generic) 1 x Per Day/30 Days Discharge Instructions: Cleanse the wound with wound cleanser prior to applying [name] clean dressing using gauze sponges, not tissue or cotton balls. [name]-Wound Care: Skin Prep (Generic) 1 x Per Day/30 Days Discharge Instructions: Use skin prep as directed Prim Dressing: Grafix #11 (Generic) 1 x Per Day/30 Days ary Discharge Instructions: applied by provider. Prim Dressing: adaptic and steri-strips 1 x Per Day/30 Days ary Discharge Instructions: to secure grafix to wound bed.  leave in place. Secondary Dressing: ABD Pad, 8x10 (Generic) 1 x Per Day/30 Days Discharge Instructions: Apply over primary dressing as directed. Secured With: [employer], 4.5x3.1 (in/yd) (Generic) 1 x Per Day/30 Days Discharge Instructions: Secure with Kerlix as directed. Secured With: 25M Medipore H Soft Cloth Surgical T ape, 4 x 10 (in/yd) (Generic) 1 x Per Day/30 Days Discharge Instructions: Secure with tape as directed. WOUND #6: - Amputation Site - Transmetatarsal Wound Laterality: Right ZAMARRION, SEGARRA [name] ([id number]) [id number] Page 13 of 15 Cleanser: Wound Cleanser (Generic) 1 x Per Day/30 Days Discharge Instructions: Cleanse the wound with wound cleanser prior to applying [name] clean dressing using gauze sponges, not tissue or cotton balls. [name]-Wound Care: Skin Prep (Generic) 1 x Per Day/30 Days Discharge Instructions: Use skin prep as directed Prim Dressing: Grafix #10 (Generic) 1 x Per Day/30 Days ary Discharge Instructions: applied by provider. Prim Dressing: adaptic and steri-strips 1 x Per Day/30 Days ary Discharge Instructions: to secure grafix to wound bed. leave in place. Prim Dressing: cutimed sorbact 1 x Per Day/30 Days ary Discharge Instructions: lightly pack into wound bed behind the grafix. Secondary Dressing: ABD Pad, 8x10 (Generic) 1 x Per Day/30 Days Discharge Instructions: Apply  over primary dressing as directed. Secured With: [employer], 4.5x3.1 (in/yd) (Generic) 1 x Per Day/30 Days Discharge Instructions: Secure with Kerlix as directed. Secured With: 25M Medipore H Soft Cloth Surgical T ape, 4 x 10 (in/yd) (Generic) 1 x Per Day/30 Days Discharge Instructions: Secure with tape as directed. 1. In office sharp debridement 2. Grafix placed in standard fashion 3. Aggressive offloadingoosurgical shoe 4. Follow-up in 1 week Electronic Signature(s) Signed: 08/20/2022 12:26:40 PM By: [name] DO Entered By: [name] on 08/20/2022 11:39:20 -------------------------------------------------------------------------------- HxROS Details Patient Name: Date of Service: [name], [name]. 08/20/2022 10:15 [name] Medical Record Number: [id number] Patient Account Number: [id number] Date of Birth/Sex: Treating RN: 09-19-1955 (67 y.o. [name]) Primary Care Provider: [name] Other Clinician: Referring Provider: Treating Provider/Extender: [name] in Treatment: 18 Information Obtained From Patient Chart Eyes Medical History: Negative for: Cataracts; Glaucoma; Optic Neuritis Respiratory Medical History: Positive for: Asthma; Sleep Apnea Negative for: Aspiration; Chronic Obstructive Pulmonary Disease (COPD); Pneumothorax; Tuberculosis Cardiovascular Medical History: Positive for: Hypertension; Peripheral Venous Disease Negative for: Angina; Arrhythmia; Congestive Heart Failure; Coronary Artery Disease; Deep Vein Thrombosis; Hypotension; Myocardial Infarction; Peripheral Arterial Disease; Phlebitis; Vasculitis Endocrine Medical History: Positive for: Type II Diabetes Negative for: Type I Diabetes Time with diabetes: 20 years Treated with: Insulin, Oral agents Blood sugar tested every day: Yes Tested : 4x day Integumentary (Skin) Medical History: Negative for: History of Burn KEEVAN, KIMBERLING [name] ([id number])  [id number] Page 14 of 15 Musculoskeletal Medical History: Positive for: Osteomyelitis Negative for: Gout; Rheumatoid Arthritis; Osteoarthritis Neurologic Medical History: Positive for: Neuropathy Negative for: Dementia; Quadriplegia; Paraplegia; Seizure Disorder Psychiatric Medical History: Past Medical History Notes: PTSD Immunizations Pneumococcal Vaccine: Received Pneumococcal Vaccination: No Implantable Devices None Hospitalization / Surgery History Type of Hospitalization/Surgery 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 Posey Pronto aortogram 12/08/2021 Dr. Virl Cagey [facility] Family and Social History Cancer: No; Diabetes: Yes - Mother,Siblings; Heart Disease: Yes - Siblings; Hereditary Spherocytosis: No; Hypertension: Yes - Siblings; Kidney Disease: No; Lung Disease: No; Seizures: No; Stroke: Yes - Mother; Thyroid Problems: No; Tuberculosis: No; Former smoker; Marital Status - Single; Alcohol Use: Never; Drug Use: No History; Caffeine Use: Daily; Financial Concerns: No; Food, Clothing or Shelter Needs: No; Support System Lacking: No; Transportation  Concerns: No Electronic Signature(s) Signed: 08/20/2022 12:26:40 PM By: [name] DO Entered By: [name] on 08/20/2022 11:37:27 -------------------------------------------------------------------------------- SuperBill Details Patient Name: Date of Service: [name], [name]. 08/20/2022 Medical Record Number: [id number] Patient Account Number: [id number] Date of Birth/Sex: Treating RN: 20-Jul-1955 (67 y.o. [name], [name] Primary Care Provider: [name] Other Clinician: Referring Provider: Treating Provider/Extender: [name] in Treatment: 18 Diagnosis Coding ICD-10 Codes Code Description [phone] Non-pressure chronic ulcer of other part of left foot with fat layer exposed M86.172 Other acute osteomyelitis, left ankle and foot L97.528  Non-pressure chronic ulcer of other part of left foot with other specified severity E11.621 Type 2 diabetes mellitus with foot ulcer E11.42 Type 2 diabetes mellitus with diabetic polyneuropathy S91.301A Unspecified open wound, right foot, initial encounter 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 severity Facility Procedures LORENZO, BILLITER ([id number]): CPT4 Code Description [id number] [phone]- Grafix PL 2X3 sq cm (6 units) [id number] Page 15 of 15: Modifier Quantity 6 ZAYDN, MCCANTS [name] ([id number]): [id number] - SKIN SUB GRAFT FACE/NK/HF/G ICD-10 Diagnosis Description L97.522 Non-pressure chronic ulcer of other part of left foot with fat S91.301A Unspecified open wound, right foot, initial encounter [id number] Page 15 of 15: 1 layer exposed Physician Procedures : CPT4 Code Description Modifier [id number] - WC PHYS SKIN SUB GRAFT FACE/NK/HF/G ICD-10 Diagnosis Description L97.522 Non-pressure chronic ulcer of other part of left foot with fat layer exposed S91.301A Unspecified open wound, right foot, initial  encounter Quantity: 1 Electronic Signature(s) Signed: 08/20/2022 12:26:40 PM By: [name] DO Entered By: [name] on 08/20/2022 11:39:33

## 2022-08-21 NOTE — Progress Notes (Signed)
QUINTEL, DELFIERRO A ([id number]) [id number] Page 1 of 1 Visit Report for 08/20/2022 SuperBill Details Patient Name: Date of Service: [name], [name] A. 08/20/2022 Medical Record Number: [id number] Patient Account Number: [id number] Date of Birth/Sex: Treating RN: June 17, 1956 (67 y.o. [name] Primary Care Provider: [name] Other Clinician: [name] Referring Provider: Treating Provider/Extender: [name] in Treatment: 18 Diagnosis Coding ICD-10 Codes Code Description [phone] Non-pressure chronic ulcer of other part of left foot with fat layer exposed M86.172 Other acute osteomyelitis, left ankle and foot L97.528 Non-pressure chronic ulcer of other part of left foot with other specified severity E11.621 Type 2 diabetes mellitus with foot ulcer E11.42 Type 2 diabetes mellitus with diabetic polyneuropathy S91.301A Unspecified open wound, right foot, initial encounter 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 severity Facility Procedures CPT4 Code Description Modifier Quantity [id number]-(Facility Use Only) HBOT full body chamber, 70mn , 4 ICD-10 Diagnosis Description E11.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 M86.172 Other acute osteomyelitis, left ankle and foot Physician Procedures Quantity CPT4 Code Description Modifier [id number] - WC PHYS HYPERBARIC OXYGEN THERAPY 1 ICD-10 Diagnosis Description E11.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 M86.172 Other acute osteomyelitis, left ankle and  foot Electronic Signature(s) Signed: 08/20/2022 11:34:01 AM By: [name] BurnetCHT EMT BS , , Signed: 08/20/2022 12:26:40 PM By: [name] ShanDO Entered By: [name] Burneton 08/20/2022 11:34:00

## 2022-08-21 NOTE — Progress Notes (Signed)
NIXEN, AVALO A ([id number]) [id number] Page 1 of 2 Visit Report for 08/20/2022 Arrival Information Details Patient Name: Date of Service: [name], [name] 08/20/2022 8:00 A M Medical Record Number: [id number] Patient Account Number: [id number] Date of Birth/Sex: Treating RN: 09-04-1955 (67 y.o. [name] Primary Care [place]: [name] Other Clinician: [name] Referring Kindel Rochefort: Treating Theoren Palka/Extender: [name] in Treatment: 18 Visit Information History Since Last Visit All ordered tests and consults were completed: Yes Patient Arrived: Cane Added or deleted any medications: No Arrival Time: 07:20 Any new allergies or adverse reactions: No Accompanied By: self Had a fall or experienced change in No Transfer Assistance: None activities of daily living that may affect Patient Identification Verified: Yes risk of falls: Secondary Verification Process Completed: Yes Signs or symptoms of abuse/neglect since last visito No Patient Requires Transmission-Based Precautions: No Hospitalized since last visit: No Patient Has Alerts: Yes Implantable device outside of the clinic excluding No cellular tissue based products placed in the center since last visit: Pain Present Now: No Electronic Signature(s) Signed: 08/20/2022 9:23:15 AM By: [name] CHT EMT BS , , Previous Signature: 08/20/2022 9:22:44 AM Version By: [name] CHT EMT BS , , Previous Signature: 08/20/2022 9:22:25 AM Version By: [name] CHT EMT BS , , Entered By: [name] on 08/20/2022 09:23:15 -------------------------------------------------------------------------------- Encounter Discharge Information Details Patient Name: Date of Service: [name], [name] 08/20/2022 8:00 A M Medical Record Number: [id number] Patient Account Number: [id number] Date of Birth/Sex: Treating RN: 1956/05/19 (67 y.o.  [name] Primary Care Antwian [place]: [name] Other Clinician: [name] Referring Aliou Mealey: Treating Rhett Mutschler/Extender: [name] in Treatment: 18 Encounter Discharge Information Items Discharge Condition: Stable Ambulatory Status: Wheelchair Discharge Destination: Other (Note Required) Transportation: Other Accompanied By: staff Schedule Follow-up Appointment: No Clinical Summary of Care: Notes Patient had wound care appointment. Electronic Signature(s) Signed: 08/20/2022 11:36:57 AM By: [name] CHT EMT BS , , Entered By: [name] on 08/20/2022 11:36:57 [name] ([id number] Page 2 of 2 -------------------------------------------------------------------------------- Vitals Details Patient Name: Date of Service: [name], [name] 08/20/2022 8:00 A M Medical Record Number: [id number] Patient Account Number: [id number] Date of Birth/Sex: Treating RN: 10-09-1955 (67 y.o. [name] Primary Care Mailin Coglianese: [name] Other Clinician: [name] Referring Needham Biggins: Treating Kunio Cummiskey/Extender: [name] in Treatment: 18 Vital Signs Time Taken: 08:06 Temperature (F): 98.3 Height (in): 72 Pulse (bpm): 91 Weight (lbs): 220 Respiratory Rate (breaths/min): 18 Body Mass Index (BMI): 29.8 Blood Pressure (mmHg): 144/69 Capillary Blood Glucose (mg/dl): 135 Reference Range: 80 - 120 mg / dl Electronic Signature(s) Signed: 08/20/2022 11:37:26 AM By: [name] CHT EMT BS , , Previous Signature: 08/20/2022 9:51:03 AM Version By: [name] CHT EMT BS , , Previous Signature: 08/20/2022 9:23:46 AM Version By: [name] CHT EMT BS , , Entered By: [name] on 08/20/2022 11:37:26

## 2022-08-21 NOTE — Progress Notes (Addendum)
JONES, STRIMPLE [name] ([id number]) [id number] Page 1 of 10 Visit Report for 08/20/2022 Arrival Information Details Patient Name: Date of Service: [name], [name]. 08/20/2022 10:15 [name] Medical Record Number: [id number] Patient Account Number: [id number] Date of Birth/Sex: Treating RN: August 20, 1955 (67 y.o. M) Primary Care [name] Other Clinician: Referring [name]: Treating [name]/Extender: [name] in Treatment: 18 Visit Information History Since Last Visit All ordered tests and consults were completed: No Patient Arrived: Wheel Chair Added or deleted any medications: No Arrival Time: 10:36 Any new allergies or adverse reactions: No Accompanied By: self Had [name] fall or experienced change in No Transfer Assistance: Manual activities of daily living that may affect Patient Identification Verified: Yes risk of falls: Secondary Verification Process Completed: Yes Signs or symptoms of abuse/neglect since last visito No Patient Requires Transmission-Based Precautions: No Hospitalized since last visit: No Patient Has Alerts: Yes Implantable device outside of the clinic excluding No cellular tissue based products placed in the center since last visit: Pain Present Now: No Electronic Signature(s) Signed: 08/20/2022 10:51:25 AM By: [name] Entered By: [name] on 08/20/2022 10:36:23 -------------------------------------------------------------------------------- Encounter Discharge Information Details Patient Name: Date of Service: [name], [name]. 08/20/2022 10:15 [name] Medical Record Number: [id number] Patient Account Number: [id number] Date of Birth/Sex: Treating RN: [birth year] (67 y.o. [name] Primary Care [name]: [name] Other Clinician: Referring Arriana Lohmann: Treating [name]/Extender: [name] in Treatment: 18 Encounter Discharge Information Items Post  Procedure Vitals Discharge Condition: Stable Temperature (F): 98.3 Ambulatory Status: Wheelchair Pulse (bpm): 76 Discharge Destination: Home Respiratory Rate (breaths/min): 18 Transportation: Private Auto Blood Pressure (mmHg): 134/74 Accompanied By: self Schedule Follow-up Appointment: Yes Clinical Summary of Care: Electronic Signature(s) Signed: 08/20/2022 4:32:13 PM By: [name] Entered By: [name] on 08/20/2022 11:31:20 [name] ([id number] Page 2 of 10 -------------------------------------------------------------------------------- Lower Extremity Assessment Details Patient Name: Date of Service: [name], [name]. 08/20/2022 10:15 [name] Medical Record Number: [id number] Patient Account Number: [id number] Date of Birth/Sex: Treating RN: May 03, 1956 (67 y.o. [name] Primary Care [name]: [name] Other Clinician: Referring [name]: Treating [name]/Extender: [name], [name] in Treatment: 18 Edema Assessment Assessed: [name]: Yes] [name]: Yes] Edema: [Left: Yes] [Right: Yes] Calf Left: Right: Point of Measurement: 36 cm From Medial Instep 35 cm 36 cm Ankle Left: Right: Point of Measurement: 9 cm From Medial Instep 22 cm 22 cm Vascular Assessment Pulses: Dorsalis Pedis Palpable: [Left:Yes] [Right:Yes] Electronic Signature(s) Signed: 08/20/2022 4:32:13 PM By: [name] Entered By: [name] on 08/20/2022 11:06:24 -------------------------------------------------------------------------------- Multi Wound Chart Details Patient Name: Date of Service: [name], [name]. 08/20/2022 10:15 [name] Medical Record Number: [id number] Patient Account Number: [id number] Date of Birth/Sex: Treating RN: Dec 08, 1955 (67 y.o. M) Primary Care [name]: [name] Other Clinician: Referring [name]: Treating [name]/Extender: [name], [name] in  Treatment: 18 Vital Signs Height(in): 72 Capillary Blood Glucose(mg/dl): 86 Weight(lbs): 220 Pulse(bpm): 76 Body Mass Index(BMI): 29.8 Blood Pressure(mmHg): 134/74 Temperature(F): 98.3 Respiratory Rate(breaths/min): 18 [5:Photos:] [[name]:[name] [id number].pdf Page 3 of 10] Left, Lateral Foot Right Amputation Site - [name] Wound Location: Transmetatarsal Surgical Injury Gradually Appeared [name] Wounding Event: Diabetic Wound/Ulcer of the Lower Diabetic Wound/Ulcer of the Lower [name] Primary Etiology: Extremity Extremity Open Surgical Wound [name] [name] Secondary Etiology: Asthma, Sleep Apnea, Hypertension, Asthma, Sleep Apnea, Hypertension, [name] Comorbid History: Peripheral Venous Disease, Type II Peripheral Venous Disease, Type  II Diabetes, Osteomyelitis, Neuropathy Diabetes, Osteomyelitis, Neuropathy 12/20/2021 07/16/2022 [name] Date Acquired: 61 5 [name] [name]: Open Open [name] Wound Status: No No [name] Wound Recurrence: 3x0.4x0.2 0.5x0.2x0.5 [name] Measurements L x W x D (cm) 0.942 0.079 [name] [name] (cm) : rea 0.188 0.039 [name] Volume (cm) : 90.60% 92.00% [name] % Reduction in [name] rea: 96.20% 96.00% [name] % Reduction in Volume: Grade 3 Grade 2 [name] Classification: Medium Medium [name] Exudate [name] mount: Serosanguineous Serosanguineous [name] Exudate Type: red, brown red, brown [name] Exudate Color: Epibole Distinct, outline attached [name] Wound Margin: Large (67-100%) Large (67-100%) [name] Granulation [name] mount: Red, Pink Red [name] Granulation Quality: Small (1-33%) None Present (0%) [name] Necrotic [name] mount: Fat Layer (Subcutaneous Tissue): Yes Fat Layer (Subcutaneous Tissue): Yes [name] Exposed Structures: Fascia: No Fascia: No Tendon: No Tendon: No Muscle: No Muscle: No Joint: No Joint: No Bone: No Bone: No Large (67-100%) None [name] Epithelialization: Debridement - Excisional Debridement - Excisional [name] Debridement: Pre-procedure Verification/Time Out 11:05 11:05  [name] Taken: Lidocaine 5% topical ointment Lidocaine 5% topical ointment [name] Pain Control: Subcutaneous, Slough Callus, Subcutaneous, Slough [name] Tissue Debrided: Skin/Subcutaneous Tissue Skin/Subcutaneous Tissue [name] Level: 1.2 0.1 [name] Debridement [name] (sq cm): rea Curette Curette [name] Instrument: Minimum Minimum [name] Bleeding: Pressure Pressure [name] Hemostasis [name] chieved: 0 0 [name] Procedural Pain: 0 0 [name] Post Procedural Pain: Procedure was tolerated well Procedure was tolerated well [name] Debridement Treatment Response: 3x0.4x0.2 0.5x0.2x0.5 [name] Post Debridement Measurements L x W x D (cm) 0.188 0.039 [name] Post Debridement Volume: (cm) Callus: Yes Callus: Yes [name] Periwound Skin Texture: Excoriation: No Excoriation: No Induration: No Induration: No Crepitus: No Crepitus: No Rash: No Rash: No Scarring: No Scarring: No Maceration: No Dry/Scaly: Yes [name] Periwound Skin Moisture: Dry/Scaly: No Maceration: No Atrophie Blanche: No Atrophie Blanche: No [name] Periwound Skin Color: Cyanosis: No Cyanosis: No Ecchymosis: No Ecchymosis: No Erythema: No Erythema: No Hemosiderin Staining: No Hemosiderin Staining: No Mottled: No Mottled: No Pallor: No Pallor: No Rubor: No Rubor: No No Abnormality [name] [name] Temperature: Cellular or Tissue Based Product Cellular or Tissue Based Product [name] Procedures Performed: Debridement Debridement Treatment Notes Wound #5 (Foot) Wound Laterality: Left, Lateral Cleanser Wound Cleanser Discharge Instruction: Cleanse the wound with wound cleanser prior to applying [name] clean dressing using gauze sponges, not tissue or cotton balls. Peri-Wound Care Skin Prep Discharge Instruction: Use skin prep as directed Topical Primary Dressing JEWELL, DESTA [name] ([id number]) [id number].pdf Page 4 of 10 Grafix #11 Discharge Instruction: applied by Eugenia Eldredge. adaptic and steri-strips Discharge Instruction: to secure grafix to wound  bed. leave in place. Secondary Dressing ABD Pad, 8x10 Discharge Instruction: Apply over primary dressing as directed. Secured With [employer], 4.5x3.1 (in/yd) Discharge Instruction: Secure with Kerlix as directed. 75M Medipore H Soft Cloth Surgical T ape, 4 x 10 (in/yd) Discharge Instruction: Secure with tape as directed. Compression Wrap Compression Stockings Add-Ons Wound #6 (Amputation Site - Transmetatarsal) Wound Laterality: Right Cleanser Wound Cleanser Discharge Instruction: Cleanse the wound with wound cleanser prior to applying [name] clean dressing using gauze sponges, not tissue or cotton balls. Peri-Wound Care Skin Prep Discharge Instruction: Use skin prep as directed Topical Primary Dressing Grafix #10 Discharge Instruction: applied by Bernadine Melecio. adaptic and steri-strips Discharge Instruction: to secure grafix to wound bed. leave in place. cutimed sorbact Discharge Instruction: lightly pack into wound bed behind the grafix. Secondary Dressing ABD Pad, 8x10 Discharge Instruction: Apply over primary dressing as directed. Secured With [employer], 4.5x3.1 (in/yd) Discharge Instruction: Secure with Kerlix as  directed. 68M Medipore H Soft Cloth Surgical T ape, 4 x 10 (in/yd) Discharge Instruction: Secure with tape as directed. Compression Wrap Compression Stockings Add-Ons Electronic Signature(s) Signed: 08/20/2022 12:26:40 PM By: [name] DO Entered By: [name] on 08/20/2022 11:36:33 -------------------------------------------------------------------------------- Multi-Disciplinary Care Plan Details Patient Name: Date of Service: [name], [name]. 08/20/2022 10:15 [name] Cyndie Chime [name] ([id number] Page 5 of 10 Medical Record Number: [id number] Patient Account Number: [id number] Date of Birth/Sex: Treating RN: August 02, 1955 (67 y.o. [name] Primary Care Evelynne Spiers: [name] Other  Clinician: Referring Antawn Sison: Treating Giovoni Bunch/Extender: [name], [name] in Treatment: 18 Active Inactive Osteomyelitis Nursing Diagnoses: Infection: osteomyelitis Goals: Diagnostic evaluation for osteomyelitis completed as ordered Date Initiated: 04/23/2022 Target Resolution Date: 09/04/2022 Goal Status: Active Signs and symptoms for osteomyelitis will be recognized and promptly addressed Date Initiated: 04/23/2022 Target Resolution Date: 09/04/2022 Goal Status: Active Interventions: Assess for signs and symptoms of osteomyelitis resolution every visit Provide education on osteomyelitis Screen for HBO Treatment Activities: Biopsy : 04/16/2022 Consult for HBO : 04/23/2022 Surgical debridement : 04/23/2022 Systemic antibiotics : 04/23/2022 T ordered outside of clinic : 04/23/2022 est Notes: Wound/Skin Impairment Nursing Diagnoses: Knowledge deficit related to ulceration/compromised skin integrity Goals: Patient/caregiver will verbalize understanding of skin care regimen Date Initiated: 04/14/2022 Target Resolution Date: 09/04/2022 Goal Status: Active Interventions: Assess patient/caregiver ability to perform ulcer/skin care regimen upon admission and as needed Assess ulceration(s) every visit Provide education on ulcer and skin care Notes: Electronic Signature(s) Signed: 08/20/2022 4:32:13 PM By: [name] Entered By: [name] on 08/20/2022 11:25:34 -------------------------------------------------------------------------------- Pain Assessment Details Patient Name: Date of Service: [name], [name]. 08/20/2022 10:15 [name] Medical Record Number: [id number] Patient Account Number: [id number] Date of Birth/Sex: Treating RN: 1955-10-26 (67 y.o. M) Primary Care Armari Fussell: [name] Other Clinician: Referring Shakya Sebring: Treating Eusebia Grulke/Extender: [name] in Treatment: [address] ZEB, [name] ([id number]) [id number] Page 6 of 10 Location of Pain Severity and Description of Pain Patient Has Paino Yes Site Locations Rate the pain. Current Pain Level: 0 Worst Pain Level: 10 Least Pain Level: 0 Tolerable Pain Level: 2 Pain Management and Medication Current Pain Management: Electronic Signature(s) Signed: 08/20/2022 10:51:25 AM By: [name] Entered By: [name] on 08/20/2022 10:37:38 -------------------------------------------------------------------------------- Patient/Caregiver Education Details Patient Name: Date of Service: [name], Vinetta Bergamo 2/15/2024andnbsp10:15 [name] Medical Record Number: [id number] Patient Account Number: [id number] Date of Birth/Gender: Treating RN: 22-Mar-1956 (67 y.o. [name] Primary Care Physician: [name] Other Clinician: Referring Physician: Treating Physician/Extender: [name] in Treatment: 18 Education Assessment Education Provided To: Patient Education Topics Provided Wound/Skin Impairment: Handouts: Caring for Your Ulcer Methods: Explain/Verbal Responses: Reinforcements needed Electronic Signature(s) Signed: 08/20/2022 4:32:13 PM By: [name] Entered By: [name] on 08/20/2022 11:23:23 Wound Assessment Details -------------------------------------------------------------------------------- [name] ([id number] Page 7 of 10 Patient Name: Date of Service: [name], [name]. 08/20/2022 10:15 [name] Medical Record Number: [id number] Patient Account Number: [id number] Date of Birth/Sex: Treating RN: Jun 01, 1956 (67 y.o. [name] Primary Care Denver Harder: [name] Other Clinician: Referring Ervey Fallin: Treating Derita Michelsen/Extender: [name], [name] in Treatment: 18 Wound Status Wound Number: 5 Primary Diabetic Wound/Ulcer of the Lower Extremity Etiology: Wound  Location: Left, Lateral Foot Secondary Open Surgical Wound Wounding Event: Surgical Injury Etiology: Date Acquired: 12/20/2021 Wound Open [name]: 18 Status: Clustered Wound: No  Comorbid Asthma, Sleep Apnea, Hypertension, Peripheral Venous Disease, History: Type II Diabetes, Osteomyelitis, Neuropathy Photos Wound Measurements Length: (cm) 3 % Reduction in Area: 90.6% Width: (cm) 0.4 % Reduction in Volume: 96.2% Depth: (cm) 0.2 Epithelialization: Large (67-100%) Area: (cm) 0.942 Tunneling: No Volume: (cm) 0.188 Undermining: No Wound Description Classification: Grade 3 Foul Odor After Cleansing: No Wound Margin: Epibole Slough/Fibrino Yes Exudate Amount: Medium Exudate Type: Serosanguineous Exudate Color: red, brown Wound Bed Granulation Amount: Large (67-100%) Exposed Structure Granulation Quality: Red, Pink Fascia Exposed: No Necrotic Amount: Small (1-33%) Fat Layer (Subcutaneous Tissue) Exposed: Yes Tendon Exposed: No Muscle Exposed: No Joint Exposed: No Bone Exposed: No Periwound Skin Texture Texture Color No Abnormalities Noted: No No Abnormalities Noted: Yes Callus: Yes Temperature / Pain Crepitus: No Temperature: No Abnormality Excoriation: No Induration: No Rash: No Scarring: No Moisture No Abnormalities Noted: No Dry / Scaly: No Maceration: No Treatment Notes Wound #5 (Foot) Wound Laterality: Left, Lateral Cleanser [name], [name] ([id number] Page 8 of 10 Wound Cleanser Discharge Instruction: Cleanse the wound with wound cleanser prior to applying [name] clean dressing using gauze sponges, not tissue or cotton balls. Peri-Wound Care Skin Prep Discharge Instruction: Use skin prep as directed Topical Primary Dressing Grafix #11 Discharge Instruction: applied by Anthoni Geerts. adaptic and steri-strips Discharge Instruction: to secure grafix to wound bed. leave in place. Secondary Dressing ABD Pad,  8x10 Discharge Instruction: Apply over primary dressing as directed. Secured With [employer], 4.5x3.1 (in/yd) Discharge Instruction: Secure with Kerlix as directed. 48M Medipore H Soft Cloth Surgical T ape, 4 x 10 (in/yd) Discharge Instruction: Secure with tape as directed. Compression Wrap Compression Stockings Add-Ons Electronic Signature(s) Signed: 08/20/2022 4:32:13 PM By: [name] Previous Signature: 08/20/2022 10:51:25 AM Version By: [name] Entered By: [name] on 08/20/2022 11:12:45 -------------------------------------------------------------------------------- Wound Assessment Details Patient Name: Date of Service: [name], [name]. 08/20/2022 10:15 [name] Medical Record Number: [id number] Patient Account Number: [id number] Date of Birth/Sex: Treating RN: 09-Jan-1956 (67 y.o. [name] Primary Care Veronica Guerrant: [name] Other Clinician: Referring Nadyne Gariepy: Treating Yolando Gillum/Extender: [name], [name] in Treatment: 18 Wound Status Wound Number: 6 Primary Diabetic Wound/Ulcer of the Lower Extremity Etiology: Wound Location: Right Amputation Site - Transmetatarsal Wound Open Wounding Event: Gradually Appeared Status: Date Acquired: 07/16/2022 Comorbid Asthma, Sleep Apnea, Hypertension, Peripheral Venous Disease, [name]: 5 History: Type II Diabetes, Osteomyelitis, Neuropathy Clustered Wound: No Photos [name], [name] ([id number] Page 9 of 10 Wound Measurements Length: (cm) 0.5 Width: (cm) 0.2 Depth: (cm) 0.5 Area: (cm) 0.079 Volume: (cm) 0.039 % Reduction in Area: 92% % Reduction in Volume: 96% Epithelialization: None Tunneling: No Undermining: No Wound Description Classification: Grade 2 Wound Margin: Distinct, outline attached Exudate Amount: Medium Exudate Type: Serosanguineous Exudate Color: red, brown Foul Odor After Cleansing:  No Slough/Fibrino Yes Wound Bed Granulation Amount: Large (67-100%) Exposed Structure Granulation Quality: Red Fascia Exposed: No Necrotic Amount: None Present (0%) Fat Layer (Subcutaneous Tissue) Exposed: Yes Tendon Exposed: No Muscle Exposed: No Joint Exposed: No Bone Exposed: No Periwound Skin Texture Texture Color No Abnormalities Noted: No No Abnormalities Noted: No Callus: Yes Atrophie Blanche: No Crepitus: No Cyanosis: No Excoriation: No Ecchymosis: No Induration: No Erythema: No Rash: No Hemosiderin Staining: No Scarring: No Mottled: No Pallor: No Moisture Rubor: No No Abnormalities Noted: No Dry / Scaly: Yes Maceration: No Treatment Notes Wound #6 (Amputation Site - Transmetatarsal) Wound Laterality: Right Cleanser Wound Cleanser Discharge Instruction: Cleanse the wound with  wound cleanser prior to applying [name] clean dressing using gauze sponges, not tissue or cotton balls. Peri-Wound Care Skin Prep Discharge Instruction: Use skin prep as directed Topical Primary Dressing Grafix #10 Discharge Instruction: applied by Emagene Merfeld. adaptic and steri-strips Discharge Instruction: to secure grafix to wound bed. leave in place. cutimed sorbact Discharge Instruction: lightly pack into wound bed behind the grafix. Secondary Dressing ABD Pad, 8x10 Discharge Instruction: Apply over primary dressing as directed. Secured With [employer], 4.5x3.1 (in/yd) Discharge Instruction: Secure with Kerlix as directed. 77M Medipore H Soft Cloth Surgical T ape, 4 x 10 (in/yd) Discharge Instruction: Secure with tape as directed. Compression Wrap Compression Stockings [name], [name] ([id number]) [id number] Page 10 of 10 Add-Ons Electronic Signature(s) Signed: 08/20/2022 4:32:13 PM By: [name] Previous Signature: 08/20/2022 10:51:25 AM Version By: [name] Entered By: [name] on 08/20/2022  11:10:13 -------------------------------------------------------------------------------- Vitals Details Patient Name: Date of Service: [name], [name]. 08/20/2022 10:15 [name] Medical Record Number: [id number] Patient Account Number: [id number] Date of Birth/Sex: Treating RN: 12/13/1955 (67 y.o. M) Primary Care Rea Reser: [name] Other Clinician: Referring Rotha Cassels: Treating Braelynn Benning/Extender: [name], [name] in Treatment: 18 Vital Signs Time Taken: 10:36 Temperature (F): 98.3 Height (in): 72 Pulse (bpm): 76 Weight (lbs): 220 Respiratory Rate (breaths/min): 18 Body Mass Index (BMI): 29.8 Blood Pressure (mmHg): 134/74 Capillary Blood Glucose (mg/dl): 86 Reference Range: 80 - 120 mg / dl Electronic Signature(s) Signed: 08/20/2022 10:51:25 AM By: [name] Entered By: [name] on 08/20/2022 10:37:14

## 2022-08-21 NOTE — Progress Notes (Signed)
CAMAREON, VILLACRES A ([id number]) [id number] Page 1 of 1 Visit Report for 08/19/2022 SuperBill Details Patient Name: Date of Service: [name], [name] A. 08/19/2022 Medical Record Number: [id number] Patient Account Number: [id number] Date of Birth/Sex: Treating RN: 1955-11-23 (67 y.o. [name] Primary Care Provider: [name] Other Clinician: [name] Referring Provider: Treating Provider/Extender: [name], [name] in Treatment: 18 Diagnosis Coding ICD-10 Codes Code Description [id number] Non-pressure chronic ulcer of other part of left foot with fat layer exposed M86.172 Other acute osteomyelitis, left ankle and foot L97.528 Non-pressure chronic ulcer of other part of left foot with other specified severity E11.621 Type 2 diabetes mellitus with foot ulcer E11.42 Type 2 diabetes mellitus with diabetic polyneuropathy S91.301A Unspecified open wound, right foot, initial encounter 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 severity Facility Procedures CPT4 Code Description Modifier Quantity [id number]-(Facility Use Only) HBOT full body chamber, 69mn , 4 ICD-10 Diagnosis Description E11.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 M86.172 Other acute osteomyelitis, left ankle and foot Physician Procedures Quantity CPT4 Code Description Modifier [id number] - WC PHYS HYPERBARIC OXYGEN THERAPY 1 ICD-10 Diagnosis Description E11.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 M86.172 Other acute osteomyelitis, left ankle and  foot Electronic Signature(s) Signed: 08/19/2022 1:57:20 PM By: [name] , , Signed: 08/19/2022 5:47:19 PM By: [name] Entered By: [name] Burneton 08/19/2022 13:57:19

## 2022-08-21 NOTE — Progress Notes (Signed)
CAMBER, CHOUDHURY A ([id number]) [id number] Page 1 of 2 Visit Report for 08/20/2022 HBO Details Patient Name: Date of Service: [name], [name] 08/20/2022 8:00 A M Medical Record Number: [id number] Patient Account Number: [id number] Date of Birth/Sex: Treating RN: 12/13/1955 (67 y.o. [name] Primary Care [name]: [name] Other Clinician: [name] Referring [name]: Treating [name] [name] in Treatment: 18 HBO Treatment Course Details Treatment Course Number: 1 Ordering [name]: [name] T Treatments Ordered: otal 80 HBO Treatment Start Date: 06/01/2022 HBO Indication: Diabetic Ulcer(s) of the Lower Extremity Standard/Conservative Wound Care tried and failed greater than or equal to 30 days HBO Treatment Details Treatment Number: 49 Patient Type: Outpatient Chamber Type: Monoplace Chamber Serial #: [id number] Treatment Protocol: 2.5 ATA with 90 minutes oxygen, with two 5 minute air breaks Treatment Details Compression Rate Down: 1.5 psi / minute De-Compression Rate Up: 2.0 psi / minute A breaks and breathing ir Compress Tx Pressure periods Decompress Decompress Begins Reached (leave unused spaces Begins Ends blank) Chamber Pressure (ATA 1 2.5 2.5 2.5 2.5 2.5 - - 2.5 1 ) Clock Time (24 hr) 08:08 08:25 08:55 09:00 09:30 09:35 - - 10:05 10:16 Treatment Length: 128 (minutes) Treatment Segments: 4 Vital Signs Capillary Blood Glucose Reference Range: 80 - 120 mg / dl HBO Diabetic Blood Glucose Intervention Range: <131 mg/dl or >249 mg/dl Type: Time Vitals Blood Pulse: Respiratory Temperature: Capillary Blood Glucose Pulse Action Taken: Pressure: Rate: Glucose (mg/dl): Meter #: Oximetry (%) Taken: Pre 08:06 144/69 91 18 98.3 135 1 none per protocol Post 10:09 158/86 79 18 98.8 86 1 8 oz Glucerna given, waited 15 minutes. Treatment Response Treatment Toleration: Well Treatment  Completion Status: Treatment Completed without Adverse Event Treatment Notes Patient arrived blood glucose 135 mg/dL. Patient prepared for treatment and after performing safety check, patient was placed in the chamber travelling at 1 psi/min until confirming normal ear equalization at which time (no Afrin today), rate set was increased to 1.5 psi/min. Upon reaching 10 psig, rate set was increased again to 2 psi/min. Patient tolerated treatment and decompression at 2 psi/min. Patient's post-treatment glucose was 86 mg/dL. Patient was given 8 oz Glucerna per protocol. Patient was stable upon discharge to wound care clinic for wound care visit. Additional Procedure Documentation Tissue Sevierity: Necrosis of bone Physician HBO Attestation: I certify that I supervised this HBO treatment in accordance with Medicare guidelines. A trained emergency response team is readily available per Yes hospital policies and procedures. Continue HBOT as ordered. Yes Electronic Signature(s) Unsigned Previous Signature: 08/20/2022 3:20:00 PM Version By: [name] CHT EMT BS , , Previous Signature: 08/21/2022 10:13:15 AM Version By: [name] Shan DO Previous Signature: 08/20/2022 11:33:30 AM Version By: [name] CHT EMT BS , , [name] ([id number] Page 2 of 2 Previous Signature: 08/20/2022 11:33:30 AM Version By: [name] CHT EMT BS , , Previous Signature: 08/20/2022 12:26:40 PM Version By: [name] Shan DO Entered By: [name] on 08/21/2022 10:14:09 -------------------------------------------------------------------------------- HBO Safety Checklist Details Patient Name: Date of Service: [name], [name] 08/20/2022 8:00 A M Medical Record Number: [id number] Patient Account Number: [id number] Date of Birth/Sex: Treating RN: 07-08-1955 (67 y.o. [name] Primary Care [name]: [name] Other Clinician: [name] Referring Lucielle Vokes: Treating Shiela Bruns/Extender: [name] in Treatment: 18 HBO Safety Checklist Items Safety Checklist Consent Form Signed Patient voided / foley secured and emptied When did you last eato 0700 Last dose  of injectable or oral agent 0630 Ostomy pouch emptied and vented if applicable [name] All implantable devices assessed, documented and approved [name] Intravenous access site secured and place [name] Valuables secured Linens and cotton and cotton/polyester blend (less than 51% polyester) Personal oil-based products / skin lotions / body lotions removed Wigs or hairpieces removed [name] Smoking or tobacco materials removed [name] Books / newspapers / magazines / loose paper removed Cologne, aftershave, perfume and deodorant removed Jewelry removed (may wrap wedding band) Make-up removed [name] Hair care products removed Battery operated devices (external) removed Heating patches and chemical warmers removed Titanium eyewear removed Nail polish cured greater than 10 hours [name] Casting material cured greater than 10 hours [name] Hearing aids removed [name] Loose dentures or partials removed dentures removed Prosthetics have been removed [name] Patient demonstrates correct use of air break device (if applicable) Patient concerns have been addressed Patient grounding bracelet on and cord attached to chamber Specifics for Inpatients (complete in addition to above) Medication sheet sent with patient [name] Intravenous medications needed or due during therapy sent with patient [name] Drainage tubes (e.g. nasogastric tube or chest tube secured and vented) [name] Endotracheal or Tracheotomy tube secured [name] Cuff deflated of air and inflated with saline [name] Airway suctioned [name] Notes Paper version used prior to treatment start Electronic Signature(s) Signed: 08/20/2022 9:34:50 AM By: [name] CHT EMT BS , , Entered By: [name] on 08/20/2022  09:34:50

## 2022-08-22 NOTE — Progress Notes (Signed)
LUM, PFAB [name] ([id number]) [id number] Page 1 of 4 Visit Report for 08/21/2022 Arrival Information Details Patient Name: Date of Service: [name], [name]. 08/21/2022 8:00 [name] Medical Record Number: [id number] Patient Account Number: [id number] Date of Birth/Sex: Treating RN: Oct 06, 1955 (67 y.o. [name] Primary Care Danilyn Cocke: [name] Other Clinician: [name] Referring Adline Kirshenbaum: Treating Gideon Burstein/Extender: [name] in Treatment: 18 Visit Information History Since Last Visit All ordered tests and consults were completed: Yes Patient Arrived: Kasandra Knudsen Added or deleted any medications: No Arrival Time: 07:30 Any new allergies or adverse reactions: No Accompanied By: self Had [name] fall or experienced change in No Transfer Assistance: None activities of daily living that may affect Patient Identification Verified: Yes risk of falls: Secondary Verification Process Completed: Yes Signs or symptoms of abuse/neglect since last visito No Patient Requires Transmission-Based Precautions: No Hospitalized since last visit: No Patient Has Alerts: Yes Implantable device outside of the clinic excluding No cellular tissue based products placed in the center since last visit: Pain Present Now: No Electronic Signature(s) Signed: 08/21/2022 11:08:52 AM By: [name] CHT EMT BS , , Entered By: [name] on 08/21/2022 11:08:51 -------------------------------------------------------------------------------- Clinic Level of Care Assessment Details Patient Name: Date of Service: [name], [name]. 08/21/2022 8:00 [name] Medical Record Number: [id number] Patient Account Number: [id number] Date of Birth/Sex: Treating RN: 1956/01/16 (67 y.o. [name] Primary Care Destany Severns: [name] Other Clinician: [name] Referring Zakiyah Diop: Treating Kharson Rasmusson/Extender: [name] in Treatment: 18 Clinic Level of Care Assessment Items TOOL 4 Quantity Score []$  - 0 Use when only an EandM is performed on FOLLOW-UP visit ASSESSMENTS - Nursing Assessment / Reassessment []$  - 0 Reassessment of Co-morbidities (includes updates in patient status) []$  - 0 Reassessment of Adherence to Treatment Plan ASSESSMENTS - Wound and Skin [name] ssessment / Reassessment []$  - 0 Simple Wound Assessment / Reassessment - one wound []$  - 0 Complex Wound Assessment / Reassessment - multiple wounds []$  - 0 Dermatologic / Skin Assessment (not related to wound area) ASSESSMENTS - Focused Assessment []$  - 0 Circumferential Edema Measurements - multi extremities []$  - 0 Nutritional Assessment / Counseling / Intervention [name], [name] ([id number] Page 2 of 4 []$  - 0 Lower Extremity Assessment (monofilament, tuning fork, pulses) []$  - 0 Peripheral Arterial Disease Assessment (using hand held doppler) ASSESSMENTS - Ostomy and/or Continence Assessment and Care []$  - 0 Incontinence Assessment and Management []$  - 0 Ostomy Care Assessment and Management (repouching, etc.) PROCESS - Coordination of Care []$  - 0 Simple Patient / Family Education for ongoing care []$  - 0 Complex (extensive) Patient / Family Education for ongoing care []$  - 0 Staff obtains Programmer, systems, Records, T Results / Process Orders est []$  - 0 Staff telephones HHA, Nursing Homes / Clarify orders / etc []$  - 0 Routine Transfer to another Facility (non-emergent condition) []$  - 0 Routine Hospital Admission (non-emergent condition) []$  - 0 New Admissions / Biomedical engineer / Ordering NPWT Apligraf, etc. , []$  - 0 Emergency Hospital Admission (emergent condition) []$  - 0 Simple Discharge Coordination []$  - 0 Complex (extensive) Discharge Coordination PROCESS - Special Needs []$  - 0 Pediatric / Minor Patient Management []$  - 0 Isolation Patient Management []$  - 0 Hearing /  Language / Visual special needs []$  - 0 Assessment of Community assistance (transportation, D/C planning, etc.) []$  - 0 Additional assistance / Altered mentation []$  - 0 Support Surface(s) Assessment (bed, cushion, seat,  etc.) INTERVENTIONS - Wound Cleansing / Measurement []$  - 0 Simple Wound Cleansing - one wound []$  - 0 Complex Wound Cleansing - multiple wounds []$  - 0 Wound Imaging (photographs - any number of wounds) []$  - 0 Wound Tracing (instead of photographs) []$  - 0 Simple Wound Measurement - one wound []$  - 0 Complex Wound Measurement - multiple wounds INTERVENTIONS - Wound Dressings []$  - 0 Small Wound Dressing one or multiple wounds []$  - 0 Medium Wound Dressing one or multiple wounds []$  - 0 Large Wound Dressing one or multiple wounds []$  - 0 Application of Medications - topical []$  - 0 Application of Medications - injection INTERVENTIONS - Miscellaneous []$  - 0 External ear exam []$  - 0 Specimen Collection (cultures, biopsies, blood, body fluids, etc.) []$  - 0 Specimen(s) / Culture(s) sent or taken to Lab for analysis []$  - 0 Patient Transfer (multiple staff / Civil Service fast streamer / Similar devices) []$  - 0 Simple Staple / Suture removal (25 or less) []$  - 0 Complex Staple / Suture removal (26 or more) X- 1 10 Hypo / Hyperglycemic Management (close monitor of Blood Glucose) [name], [name] ([id number] Page 3 of 4 []$  - 0 Ankle / Brachial Index (ABI) - do not check if billed separately X- 1 5 Vital Signs Has the patient been seen at the hospital within the last three years: Yes Total Score: 15 Level Of Care: New/Established - Level 1 Electronic Signature(s) Signed: 08/21/2022 1:25:25 PM By: [name] CHT EMT BS , , Entered By: [name] on 08/21/2022 13:25:25 -------------------------------------------------------------------------------- Encounter Discharge Information Details Patient Name: Date of Service: [name],  [name] 08/21/2022 8:00 [name] Medical Record Number: [id number] Patient Account Number: [id number] Date of Birth/Sex: Treating RN: 1955-07-18 (67 y.o. [name] Primary Care Finneas Mathe: [name] Other Clinician: [name] Referring Flannery Cavallero: Treating Coby Shrewsberry/Extender: [name] in Treatment: 18 Encounter Discharge Information Items Discharge Condition: Stable Ambulatory Status: Wheelchair Discharge Destination: Home Transportation: Private Auto Accompanied By: self Schedule Follow-up Appointment: No Clinical Summary of Care: Electronic Signature(s) Signed: 08/21/2022 1:34:57 PM By: [name] CHT EMT BS , , Entered By: [name] on 08/21/2022 13:34:56 -------------------------------------------------------------------------------- General Visit Notes Details Patient Name: Date of Service: [name], [name] 08/21/2022 8:00 [name] Medical Record Number: [id number] Patient Account Number: [id number] Date of Birth/Sex: Treating RN: [birth year] (67 y.o. [name] Primary Care Joyce Leckey: [name] Other Clinician: [name] Referring Irmalee Riemenschneider: Treating Rhet Rorke/Extender: [name] in Treatment: 18 Notes Patient arrived, informed me that blood glucose was 291 mg/dL. Blood glucose was tested and result was 85 mg/dL. Patient was given 8 oz Glucerna and 2x 4 oz Orange juice per Dr. Heber Marion. Patient prepared for treatment and self-administered Afrin. Glucose was rechecked at 0807 with result of 174m/dL. Patient was prepped for treatment with safety check as well. Blood glucose was rechecked and cleared for treatment per protocol with [name] result of 117 mg/dL. Patient was placed in the chamber, travelling at 1 psi/min, and experienced pain at approximately 4 psig, three separate attempts. 835 attempted at 1 psi/min, returned to 1 ATA/ambient pressure. 2nd attempt: 0842 returned to ambient  pressure 0849 and patient asked if more Afrin would help, explaining that he barely applied any the first time. 3rd attempt 0900, returned to ambient pressure at approximately 0907. Dr. HHeber Carolinawas informed and treatment was rescheduled. Electronic Signature(s) Signed: 08/21/2022 1:24:07 PM By: SDonavan BurnetCHT EMT BS , ,  Previous Signature: 08/21/2022 12:24:47 PM Version By: [name] CHT EMT BS , , Entered By: [name] on 08/21/2022 13:24:06 [name] ([id number] Page 4 of 4 -------------------------------------------------------------------------------- Vitals Details Patient Name: Date of Service: [name], [name]. 08/21/2022 8:00 [name] Medical Record Number: [id number] Patient Account Number: [id number] Date of Birth/Sex: Treating RN: [birth year] (67 y.o. [name] Primary Care Kahlan Engebretson: [name] Other Clinician: [name] Referring Remell Giaimo: Treating Simuel Stebner/Extender: [name] in Treatment: 18 Vital Signs Time Taken: 08:05 Temperature (F): 98.6 Height (in): 72 Pulse (bpm): 85 Weight (lbs): 220 Respiratory Rate (breaths/min): 18 Body Mass Index (BMI): 29.8 Blood Pressure (mmHg): 118/72 Capillary Blood Glucose (mg/dl): 85 Reference Range: 80 - 120 mg / dl Electronic Signature(s) Signed: 08/21/2022 11:09:21 AM By: [name] CHT EMT BS , , Entered By: [name] on 08/21/2022 11:09:20

## 2022-08-22 NOTE — Progress Notes (Signed)
EMERIC, WINHAM A ([id number]) [id number] Page 1 of 1 Visit Report for 08/21/2022 HBO Safety Checklist Details Patient Name: Date of Service: [name], [name] 08/21/2022 8:00 A M Medical Record Number: [id number] Patient Account Number: [id number] Date of Birth/Sex: Treating RN: [birth year] (67 y.o. [name] Primary Care Javien Tesch: [name] Other Clinician: [name] Referring Keola Heninger: Treating Kingslee Mairena/Extender: [name], [name] in Treatment: 18 HBO Safety Checklist Items Safety Checklist Consent Form Signed Patient voided / foley secured and emptied When did you last eato 0700 Last dose of injectable or oral agent 0630 Ostomy pouch emptied and vented if applicable NA All implantable devices assessed, documented and approved NA Intravenous access site secured and place NA Valuables secured Linens and cotton and cotton/polyester blend (less than 51% polyester) Personal oil-based products / skin lotions / body lotions removed Wigs or hairpieces removed NA Smoking or tobacco materials removed NA Books / newspapers / magazines / loose paper removed Cologne, aftershave, perfume and deodorant removed Jewelry removed (may wrap wedding band) Make-up removed Hair care products removed Battery operated devices (external) removed NA Heating patches and chemical warmers removed NA Titanium eyewear removed NA Nail polish cured greater than 10 hours NA Casting material cured greater than 10 hours NA Hearing aids removed NA Loose dentures or partials removed dentures removed Prosthetics have been removed NA Patient demonstrates correct use of air break device (if applicable) Patient concerns have been addressed Patient grounding bracelet on and cord attached to chamber Specifics for Inpatients (complete in addition to above) Medication sheet sent with patient NA Intravenous medications needed or due  during therapy sent with NA patient Drainage tubes (e.g. nasogastric tube or chest tube secured and NA vented) Endotracheal or Tracheotomy tube secured NA Cuff deflated of air and inflated with saline NA Airway suctioned NA Notes Paper version used prior to treatment start. Electronic Signature(s) Signed: 08/21/2022 11:10:39 AM By: [name] CHT EMT BS , , Entered By: [name] on 08/21/2022 11:10:38

## 2022-08-24 ENCOUNTER — Encounter (HOSPITAL_BASED_OUTPATIENT_CLINIC_OR_DEPARTMENT_OTHER): Payer: Medicare HMO | Admitting: Internal Medicine

## 2022-08-24 DIAGNOSIS — L97522 Non-pressure chronic ulcer of other part of left foot with fat layer exposed: Secondary | ICD-10-CM | POA: Diagnosis not present

## 2022-08-24 DIAGNOSIS — E11621 Type 2 diabetes mellitus with foot ulcer: Secondary | ICD-10-CM

## 2022-08-24 DIAGNOSIS — L97528 Non-pressure chronic ulcer of other part of left foot with other specified severity: Secondary | ICD-10-CM | POA: Diagnosis not present

## 2022-08-24 DIAGNOSIS — M86172 Other acute osteomyelitis, left ankle and foot: Secondary | ICD-10-CM

## 2022-08-24 LAB — GLUCOSE, CAPILLARY
Glucose-Capillary: 221 mg/dL — ABNORMAL HIGH (ref 70–99)
Glucose-Capillary: 205 mg/dL — ABNORMAL HIGH (ref 70–99)

## 2022-08-24 NOTE — Progress Notes (Addendum)
ISTVAN, WACHTEL A ([id number]) [id number] Page 1 of 2 Visit Report for 08/24/2022 Arrival Information Details Patient Name: Date of Service: [name], [name] 08/24/2022 8:00 A M Medical Record Number: [id number] Patient Account Number: [id number] Date of Birth/Sex: Treating RN: Feb 27, 1956 (67 y.o. [name], [name] Primary Care Temeka Pore: [name] Other Clinician: [name] Referring Ashanti Ratti: Treating Corran Lalone/Extender: [name] in Treatment: 18 Visit Information History Since Last Visit All ordered tests and consults were completed: Yes Patient Arrived: Cane Added or deleted any medications: No Arrival Time: 07:30 Any new allergies or adverse reactions: No Accompanied By: self Had a fall or experienced change in No Transfer Assistance: None activities of daily living that may affect Patient Identification Verified: Yes risk of falls: Secondary Verification Process Completed: Yes Signs or symptoms of abuse/neglect since last visito No Patient Requires Transmission-Based Precautions: No Hospitalized since last visit: No Patient Has Alerts: Yes Implantable device outside of the clinic excluding No cellular tissue based products placed in the center since last visit: Pain Present Now: No Electronic Signature(s) Signed: 08/24/2022 8:45:10 AM By: [name] CHT EMT BS , , Entered By: [name] on 08/24/2022 08:45:09 -------------------------------------------------------------------------------- Encounter Discharge Information Details Patient Name: Date of Service: [name], [name] 08/24/2022 8:00 A M Medical Record Number: [id number] Patient Account Number: [id number] Date of Birth/Sex: Treating RN: 1955-09-15 (67 y.o. [name], [name] Primary Care Guiselle Mian: [name] Other Clinician: [name] Referring Shadara Lopez: Treating Keyontae Huckeby/Extender: [name] in Treatment: 18 Encounter Discharge Information Items Discharge Condition: Stable Ambulatory Status: Wheelchair Discharge Destination: Home Transportation: Private Auto Accompanied By: self Schedule Follow-up Appointment: No Clinical Summary of Care: Electronic Signature(s) Signed: 08/24/2022 12:47:56 PM By: [name] CHT EMT BS , , Entered By: [name] on 08/24/2022 12:47:56 [name] ([id number]) [phone].pdf Page 2 of 2 -------------------------------------------------------------------------------- Vitals Details Patient Name: Date of Service: [name], [name] 08/24/2022 8:00 A M Medical Record Number: [id number] Patient Account Number: [id number] Date of Birth/Sex: Treating RN: 03/04/1956 (67 y.o. [name], [name] Primary Care Anabia Weatherwax: [name] Other Clinician: [name] Referring Royden Bulman: Treating Concepcion Kirkpatrick/Extender: [name] in Treatment: 18 Vital Signs Time Taken: 07:41 Temperature (F): 97.3 Height (in): 72 Pulse (bpm): 83 Weight (lbs): 220 Respiratory Rate (breaths/min): 16 Body Mass Index (BMI): 29.8 Blood Pressure (mmHg): 151/84 Capillary Blood Glucose (mg/dl): 221 Reference Range: 80 - 120 mg / dl Electronic Signature(s) Signed: 08/24/2022 8:50:54 AM By: [name] CHT EMT BS , , Entered By: [name] on 08/24/2022 08:50:54

## 2022-08-24 NOTE — Progress Notes (Signed)
DERRIKE, ASHCRAFT A ([id number]) [id number] Page 1 of 1 Visit Report for 08/21/2022 SuperBill Details Patient Name: Date of Service: [name], [name] A. 08/21/2022 Medical Record Number: [id number] Patient Account Number: [id number] Date of Birth/Sex: Treating RN: 1955-11-08 (67 y.o. [name] Primary Care Provider: [name] Other Clinician: [name] Referring Provider: Treating Provider/Extender: [name] in Treatment: 18 Diagnosis Coding ICD-10 Codes Code Description [id number] Non-pressure chronic ulcer of other part of left foot with fat layer exposed M86.172 Other acute osteomyelitis, left ankle and foot L97.528 Non-pressure chronic ulcer of other part of left foot with other specified severity E11.621 Type 2 diabetes mellitus with foot ulcer E11.42 Type 2 diabetes mellitus with diabetic polyneuropathy S91.301A Unspecified open wound, right foot, initial encounter 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 severity Facility Procedures CPT4 Code Description Modifier Quantity [id number] - WOUND CARE VISIT-LEV 1 EST PT 1 Electronic Signature(s) Signed: 08/21/2022 1:26:18 PM By: [name] CHT EMT BS , , Signed: 08/24/2022 10:41:06 AM By: [name] DO Entered By: [name] on 08/21/2022 13:26:17

## 2022-08-24 NOTE — Progress Notes (Addendum)
HERSH, VASILOPOULOS A ([id number]) [id number] Page 1 of 3 Visit Report for 08/24/2022 HBO Details Patient Name: Date of Service: [name], [name] 08/24/2022 8:00 A M Medical Record Number: [id number] Patient Account Number: [id number] Date of Birth/Sex: Treating RN: 20-Nov-1955 (67 y.o. [name], [name] Primary Care Mickal Meno: [name] Other Clinician: [name] Referring Tayia Stonesifer: Treating Avyukth Bontempo/Extender: [name] in Treatment: 18 HBO Treatment Course Details Treatment Course Number: 1 Ordering Djuana Littleton: [name] T Treatments Ordered: otal 80 HBO Treatment Start Date: 06/01/2022 HBO Indication: Diabetic Ulcer(s) of the Lower Extremity Standard/Conservative Wound Care tried and failed greater than or equal to 30 days HBO Treatment Details Treatment Number: 50 Patient Type: Outpatient Chamber Type: Monoplace Chamber Serial #: [id number] Treatment Protocol: 2.5 ATA with 90 minutes oxygen, with two 5 minute air breaks Treatment Details Compression Rate Down: 1.0 psi / minute De-Compression Rate Up: 2.0 psi / minute A breaks and breathing ir Compress Tx Pressure periods Decompress Decompress Begins Reached (leave unused spaces Begins Ends blank) Chamber Pressure (ATA 1 2.5 2.5 2.5 2.5 2.5 - - 2.5 1 ) Clock Time (24 hr) 08:02 08:28 08:58 09:03 09:33 09:38 - - 10:08 10:31 Treatment Length: 149 (minutes) Treatment Segments: 5 Vital Signs Capillary Blood Glucose Reference Range: 80 - 120 mg / dl HBO Diabetic Blood Glucose Intervention Range: <131 mg/dl or >249 mg/dl Type: Time Vitals Blood Respiratory Capillary Blood Glucose Pulse Action Pulse: Temperature: Taken: Pressure: Rate: Glucose (mg/dl): Meter #: Oximetry (%) Taken: Pre 07:41 151/84 83 16 97.3 221 1 none per protocol Post 10:35 153/88 75 16 98.3 205 1 none per protocol Treatment Response Treatment Toleration: Well Treatment Completion Status:  Treatment Completed without Adverse Event Treatment Notes Patient arrived, blood glucose level measured at 221 mg/dL. Patient self administered allergy medicine this morning and ate breakfast of [place] sausage, egg, and cheese on english muffin, with orange juice. Patient prepared for treatment. After performing safety check patient was placed in the chamber and it was compressed at 1 psi/min until reaching 5 psig, patient denied any issues with ear equalization and sinus pain. Rate set was increased to 1.5 psi/min. Upon reaching 11 psig patient complained of slight sinus pain. Pressure was decreased to 7 psig at which point, [name] stated that the pressure had eased off. Travel was started again at rate set of 1 psi/min with full open purge flow rate, effectively making travel rate 0.8 psi/min. Patient tolerated travel at that speed until reaching treatment pressure of 2.5 ATA. Patient tolerated treatment and subsequent decompression of the chamber at 1 psi/min. Rate set was lower due to some risk d/t sinus pressure experienced at the beginining of the treatment. Patient was stable upon discharge. Additional Procedure Documentation Tissue Sevierity: Necrosis of bone Physician HBO Attestation: I certify that I supervised this HBO treatment in accordance with Medicare guidelines. A trained emergency response team is readily available per Yes hospital policies and procedures. Continue HBOT as ordered. Yes Electronic Signature(s) ISSAK, BRANNON A ([id number]) [id number] Page 2 of 3 Signed: 08/25/2022 11:56:32 AM By: [name] CHT EMT BS , , Signed: 08/25/2022 1:21:56 PM By: [name] DO Previous Signature: 08/24/2022 3:41:51 PM Version By: [name] DO Previous Signature: 08/24/2022 12:46:58 PM Version By: [name] CHT EMT BS , , Previous Signature: 08/24/2022 8:57:24 AM Version By: [name] CHT EMT BS , , Previous Signature:  08/24/2022 10:35:38 AM Version By: [name] DO Previous Signature: 08/24/2022 8:49:32 AM Version By: Raenette Rover  Legrand Como CHT EMT BS , , Entered By: [name] on 08/25/2022 11:56:31 -------------------------------------------------------------------------------- HBO Safety Checklist Details Patient Name: Date of Service: [name], [name] 08/24/2022 8:00 A M Medical Record Number: [id number] Patient Account Number: [id number] Date of Birth/Sex: Treating RN: 10/31/1955 (67 y.o. [name], [name] Primary Care Sabatino Williard: [name] Other Clinician: [name] Referring Jehiel Koepp: Treating Ankush Gintz/Extender: [name], [name] in Treatment: 18 HBO Safety Checklist Items Safety Checklist Consent Form Signed Patient voided / foley secured and emptied When did you last eato 0700 Last dose of injectable or oral agent 0630 Ostomy pouch emptied and vented if applicable NA All implantable devices assessed, documented and approved NA Intravenous access site secured and place NA Valuables secured Linens and cotton and cotton/polyester blend (less than 51% polyester) Personal oil-based products / skin lotions / body lotions removed Wigs or hairpieces removed NA Smoking or tobacco materials removed NA Books / newspapers / magazines / loose paper removed Cologne, aftershave, perfume and deodorant removed Jewelry removed (may wrap wedding band) Make-up removed NA Hair care products removed Battery operated devices (external) removed Heating patches and chemical warmers removed Titanium eyewear removed Nail polish cured greater than 10 hours NA Casting material cured greater than 10 hours NA Hearing aids removed NA Loose dentures or partials removed dentures removed Prosthetics have been removed NA Patient demonstrates correct use of air break device (if applicable) Patient concerns have been addressed Patient grounding bracelet on and cord  attached to chamber Specifics for Inpatients (complete in addition to above) Medication sheet sent with patient NA Intravenous medications needed or due during therapy sent with patient NA Drainage tubes (e.g. nasogastric tube or chest tube secured and vented) NA Endotracheal or Tracheotomy tube secured NA Cuff deflated of air and inflated with saline NA Airway suctioned NA Notes Paper version used prior to treatment start. Electronic Signature(s) Signed: 08/25/2022 11:54:22 AM By: [name] CHT EMT BS , , Previous Signature: 08/24/2022 8:47:43 AM Version By: [name] CHT EMT BS , , Entered By: [name] on 08/25/2022 11:54:22 [name] ([id number].pdf Page 3 of 3

## 2022-08-25 ENCOUNTER — Encounter (HOSPITAL_BASED_OUTPATIENT_CLINIC_OR_DEPARTMENT_OTHER): Payer: Medicare HMO | Admitting: Internal Medicine

## 2022-08-25 DIAGNOSIS — E11621 Type 2 diabetes mellitus with foot ulcer: Secondary | ICD-10-CM

## 2022-08-25 DIAGNOSIS — L97528 Non-pressure chronic ulcer of other part of left foot with other specified severity: Secondary | ICD-10-CM | POA: Diagnosis not present

## 2022-08-25 DIAGNOSIS — M86172 Other acute osteomyelitis, left ankle and foot: Secondary | ICD-10-CM

## 2022-08-25 DIAGNOSIS — L97522 Non-pressure chronic ulcer of other part of left foot with fat layer exposed: Secondary | ICD-10-CM

## 2022-08-25 LAB — GLUCOSE, CAPILLARY
Glucose-Capillary: 207 mg/dL — ABNORMAL HIGH (ref 70–99)
Glucose-Capillary: 189 mg/dL — ABNORMAL HIGH (ref 70–99)

## 2022-08-25 NOTE — Progress Notes (Signed)
RYEL, FIORAVANTI A ([id number]) [id number] Page 1 of 1 Visit Report for 08/24/2022 SuperBill Details Patient Name: Date of Service: [name], [name] 08/24/2022 Medical Record Number: [id number] Patient Account Number: [id number] Date of Birth/Sex: Treating RN: 1956-01-20 (67 y.o. 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 S91.301A Unspecified open wound, right foot, initial encounter 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 severity Facility Procedures CPT4 Code Description Modifier Quantity [id number] G0277-(Facility Use Only) HBOT full body chamber, 76mn , 5 ICD-10 Diagnosis Description E11.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 M86.172 Other acute osteomyelitis, left ankle and foot Physician Procedures Quantity CPT4 Code Description Modifier [id number] - WC PHYS HYPERBARIC OXYGEN THERAPY 1 ICD-10 Diagnosis Description E11.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

## 2022-08-26 ENCOUNTER — Encounter (HOSPITAL_BASED_OUTPATIENT_CLINIC_OR_DEPARTMENT_OTHER): Payer: Medicare HMO | Admitting: Physician Assistant

## 2022-08-26 DIAGNOSIS — E11621 Type 2 diabetes mellitus with foot ulcer: Secondary | ICD-10-CM | POA: Diagnosis not present

## 2022-08-26 LAB — GLUCOSE, CAPILLARY
Glucose-Capillary: 212 mg/dL — ABNORMAL HIGH (ref 70–99)
Glucose-Capillary: 114 mg/dL — ABNORMAL HIGH (ref 70–99)

## 2022-08-26 NOTE — Progress Notes (Signed)
ZACKERIAH, SHATZER A ([id number]) [id number] Page 1 of 1 Visit Report for 08/25/2022 SuperBill Details Patient Name: Date of Service: [name] 08/25/2022 Medical Record Number: [id number] Patient Account Number: [id number] Date of Birth/Sex: Treating RN: 12-15-1955 (67 y.o. 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 S91.301A Unspecified open wound, right foot, initial encounter 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 severity Facility Procedures CPT4 Code Description Modifier Quantity [id number] G0277-(Facility Use Only) HBOT full body chamber, 22mn , 4 ICD-10 Diagnosis Description E11.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 M86.172 Other acute osteomyelitis, left ankle and foot Physician Procedures Quantity CPT4 Code Description Modifier [id number] - WC PHYS HYPERBARIC OXYGEN THERAPY 1 ICD-10 Diagnosis Description E11.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

## 2022-08-26 NOTE — Progress Notes (Signed)
[name] ([id number]) [id number] Page 1 of 2 Visit Report for 08/25/2022 Arrival Information Details Patient Name: Date of Service: [name], [name] 08/25/2022 8:00 [name] Medical Record Number: [id number] Patient Account Number: [id number] Date of Birth/Sex: Treating RN: June 11, 1956 (67 y.o. [name], [name] Primary Care Ormond Lazo: [name] Other Clinician: [name] Referring [name]: Treating [name]/Extender: [name] in Treatment: 19 Visit Information History Since Last Visit All ordered tests and consults were completed: Yes Patient Arrived: Cane Added or deleted any medications: No Arrival Time: 07:30 Any new allergies or adverse reactions: No Accompanied By: self Had [name] fall or experienced change in No Transfer Assistance: None activities of daily living that may affect Patient Identification Verified: Yes risk of falls: Secondary Verification Process Completed: Yes Signs or symptoms of abuse/neglect since last visito No Patient Requires Transmission-Based Precautions: No Hospitalized since last visit: No Patient Has Alerts: Yes Implantable device outside of the clinic excluding No cellular tissue based products placed in the center since last visit: Pain Present Now: No Electronic Signature(s) Signed: 08/25/2022 9:14:26 AM By: [name] CHT EMT BS , , Entered By: [name] on 08/25/2022 09:14:25 -------------------------------------------------------------------------------- Encounter Discharge Information Details Patient Name: Date of Service: [name], [name] 08/25/2022 8:00 [name] Medical Record Number: [id number] Patient Account Number: [id number] Date of Birth/Sex: Treating RN: 08-Sep-1955 (67 y.o. [name], [name] Primary Care Ramond Darnell: [name] Other Clinician: [name] Referring Kade Rickels: Treating Ahmarion Saraceno/Extender: [name] in Treatment: 19 Encounter Discharge Information Items Discharge Condition: Stable Ambulatory Status: Wheelchair Schedule Follow-up Appointment: No Clinical Summary of Care: Notes Patient arrives with cane and then uses [name] wheelchair. Electronic Signature(s) Signed: 08/25/2022 12:34:15 PM By: [name] CHT EMT BS , , Entered By: [name] on 08/25/2022 12:34:15 [name] ([id number]) [id number].pdf Page 2 of 2 -------------------------------------------------------------------------------- Vitals Details Patient Name: Date of Service: [name], [name] 08/25/2022 8:00 [name] Medical Record Number: [id number] Patient Account Number: [id number] Date of Birth/Sex: Treating RN: 06-23-1956 (67 y.o. [name], [name] Primary Care [name]: [name] Other Clinician: [name] Referring Lowery Paullin: Treating [name]/Extender: [name] in Treatment: 19 Vital Signs Time Taken: 08:11 Temperature (F): 98.2 Height (in): 72 Pulse (bpm): 84 Weight (lbs): 220 Respiratory Rate (breaths/min): 18 Body Mass Index (BMI): 29.8 Blood Pressure (mmHg): 121/65 Capillary Blood Glucose (mg/dl): 207 Reference Range: 80 - 120 mg / dl Electronic Signature(s) Signed: 08/25/2022 9:15:07 AM By: [name] CHT EMT BS , , Entered By: [name] on 08/25/2022 09:15:06

## 2022-08-26 NOTE — Progress Notes (Addendum)
[name], GLASSFORD [name] ([id number]) [id number] Page 1 of 2 Visit Report for 08/25/2022 HBO Details Patient Name: Date of Service: [name], [name] 08/25/2022 8:00 [name] Medical Record Number: [id number] Patient Account Number: [id number] Date of Birth/Sex: Treating RN: 16-Apr-1956 (67 y.o. [name], [place] Primary Care Maya Scholer: [name] Other Clinician: [name] Referring Keiry Kowal: Treating Berkley Cronkright/Extender: [name] in Treatment: 19 HBO Treatment Course Details Treatment Course Number: 1 Ordering Trejon Duford: [name] T Treatments Ordered: otal 80 HBO Treatment Start Date: 06/01/2022 HBO Indication: Diabetic Ulcer(s) of the Lower Extremity Standard/Conservative Wound Care tried and failed greater than or equal to 30 days HBO Treatment Details Treatment Number: 51 Patient Type: Outpatient Chamber Type: Monoplace Chamber Serial #: [id number] Treatment Protocol: 2.5 ATA with 90 minutes oxygen, with two 5 minute air breaks Treatment Details Compression Rate Down: 1.5 psi / minute De-Compression Rate Up: [name] breaks and breathing ir Compress Tx Pressure periods Decompress Decompress Begins Reached (leave unused spaces Begins Ends blank) Chamber Pressure (ATA 1 2.5 2.5 2.5 2.5 2.5 - - 2.5 1 ) Clock Time (24 hr) 08:11 08:30 09:00 09:05 09:35 09:40 - - 10:10 10:21 Treatment Length: 130 (minutes) Treatment Segments: 4 Vital Signs Capillary Blood Glucose Reference Range: 80 - 120 mg / dl HBO Diabetic Blood Glucose Intervention Range: <131 mg/dl or >249 mg/dl Type: Time Vitals Blood Respiratory Capillary Blood Glucose Pulse Action Pulse: Temperature: Taken: Pressure: Rate: Glucose (mg/dl): Meter #: Oximetry (%) Taken: Pre 08:11 121/65 84 18 98.2 207 1 none per protocol Post 10:26 155/97 69 18 97.7 189 1 none per protocol Treatment Response Treatment Toleration: Well Treatment Completion Status: Treatment  Completed without Adverse Event Treatment Notes Patient arrived, prepared for treatment. Blood glucose was 207, stated that he ate this morning, [name]. All other vital signs were within limits. Safety check performed, and patient placed in the chamber which was compressed with 100% oxygen at [name] rate of 1 psi/min until reaching 5 psig. Patient denied any issues with ear equalization and sinuses. Rate set increased to 1.5 psi/min. Patient tolerated that rate until reaching treatment depth of 2.5 ATA without any issues. Patient tolerated treatment and decompression of the chamber at 2 psi/min. Patient was stable upon discharge. Additional Procedure Documentation Tissue Sevierity: Necrosis of bone Physician HBO Attestation: I certify that I supervised this HBO treatment in accordance with Medicare guidelines. [name] trained emergency response team is readily available per Yes hospital policies and procedures. Continue HBOT as ordered. Yes Electronic Signature(s) Signed: 08/25/2022 3:41:53 PM By: [name] DO Previous Signature: 08/25/2022 11:59:18 AM Version By: [name] CHT EMT BS , , Previous Signature: 08/25/2022 9:23:18 AM Version By: [name] CHT EMT BS , , Previous Signature: 08/25/2022 9:19:30 AM Version By: [name] CHT EMT BS , , [name] ([id number]) [id number] Page 2 of 2 Previous Signature: 08/25/2022 9:19:30 AM Version By: [name] CHT EMT BS , , Entered By: [name] on 08/25/2022 14:48:42 -------------------------------------------------------------------------------- HBO Safety Checklist Details Patient Name: Date of Service: [name], [name] 08/25/2022 8:00 [name] Medical Record Number: [id number] Patient Account Number: [id number] Date of Birth/Sex: Treating RN: Jul 02, 1956 (67 y.o. [name], [name] Primary Care Dilynn Munroe: [name] Other  Clinician: [name] Referring [name]: Treating [name]/Extender: [name] in Treatment: 19 HBO Safety Checklist Items Safety Checklist Consent Form Signed Patient voided / foley secured and emptied When did you last  eato 0700 Last dose of injectable or oral agent 0625 Ostomy pouch emptied and vented if applicable NA All implantable devices assessed, documented and approved NA Intravenous access site secured and place NA Valuables secured Linens and cotton and cotton/polyester blend (less than 51% polyester) Personal oil-based products / skin lotions / body lotions removed Wigs or hairpieces removed NA Smoking or tobacco materials removed NA Books / newspapers / magazines / loose paper removed Cologne, aftershave, perfume and deodorant removed Jewelry removed (may wrap wedding band) Make-up removed NA Hair care products removed Battery operated devices (external) removed Heating patches and chemical warmers removed Titanium eyewear removed Nail polish cured greater than 10 hours NA Casting material cured greater than 10 hours NA Hearing aids removed NA Loose dentures or partials removed dentures removed Prosthetics have been removed NA Patient demonstrates correct use of air break device (if applicable) Patient concerns have been addressed Patient grounding bracelet on and cord attached to chamber Specifics for Inpatients (complete in addition to above) Medication sheet sent with patient NA Intravenous medications needed or due during therapy sent with patient NA Drainage tubes (e.g. nasogastric tube or chest tube secured and vented) NA Endotracheal or Tracheotomy tube secured NA Cuff deflated of air and inflated with saline NA Airway suctioned NA Notes Paper version used prior to treatment start. Electronic Signature(s) Signed: 08/25/2022 9:18:22 AM By: [name] CHT EMT BS , , Entered By: [name]  on 08/25/2022 [id number]

## 2022-08-27 ENCOUNTER — Encounter (HOSPITAL_BASED_OUTPATIENT_CLINIC_OR_DEPARTMENT_OTHER): Payer: Medicare HMO | Admitting: Internal Medicine

## 2022-08-27 DIAGNOSIS — M86172 Other acute osteomyelitis, left ankle and foot: Secondary | ICD-10-CM

## 2022-08-27 DIAGNOSIS — L97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 severity: Secondary | ICD-10-CM | POA: Diagnosis not present

## 2022-08-27 DIAGNOSIS — L97522 Non-pressure chronic ulcer of other part of left foot with fat layer exposed: Secondary | ICD-10-CM | POA: Diagnosis not present

## 2022-08-27 DIAGNOSIS — E11621 Type 2 diabetes mellitus with foot ulcer: Secondary | ICD-10-CM

## 2022-08-27 DIAGNOSIS — L97528 Non-pressure chronic ulcer of other part of left foot with other specified severity: Secondary | ICD-10-CM

## 2022-08-27 LAB — GLUCOSE, CAPILLARY
Glucose-Capillary: 132 mg/dL — ABNORMAL HIGH (ref 70–99)
Glucose-Capillary: 133 mg/dL — ABNORMAL HIGH (ref 70–99)
Glucose-Capillary: 83 mg/dL (ref 70–99)

## 2022-08-27 NOTE — Progress Notes (Signed)
MALLIE, CAROSELLA A ([id number]) [id number] Page 1 of 2 Visit Report for 08/26/2022 Arrival Information Details Patient Name: Date of Service: [name], [name] 08/26/2022 8:00 A M Medical Record Number: [id number] Patient Account Number: [id number] Date of Birth/Sex: Treating RN: 06/07/1956 (67 y.o. [name] Primary Care Jerri Hargadon: [name] Other Clinician: [name] Referring Britiany Silbernagel: Treating Thorsten Climer/Extender: [name], [name] in Treatment: 90 Visit Information History Since Last Visit All ordered tests and consults were completed: Yes Patient Arrived: Wheel Chair Added or deleted any medications: No Arrival Time: 07:30 Any new allergies or adverse reactions: No Accompanied By: self Had a fall or experienced change in No Transfer Assistance: None activities of daily living that may affect Patient Identification Verified: Yes risk of falls: Secondary Verification Process Completed: Yes Signs or symptoms of abuse/neglect since last visito No Patient Requires Transmission-Based Precautions: No Hospitalized since last visit: No Patient Has Alerts: Yes Implantable device outside of the clinic excluding No cellular tissue based products placed in the center since last visit: Pain Present Now: No Electronic Signature(s) Signed: 08/26/2022 12:27:37 PM By: [name] CHT EMT BS , , Previous Signature: 08/26/2022 12:27:16 PM Version By: [name] CHT EMT BS , , Entered By: [name] on 08/26/2022 12:27:37 -------------------------------------------------------------------------------- Encounter Discharge Information Details Patient Name: Date of Service: [name], [name] 08/26/2022 8:00 A M Medical Record Number: [id number] Patient Account Number: [id number] Date of Birth/Sex: Treating RN: 08-25-1955 (67 y.o. [name] Primary Care Leatrice Parilla: [name] Other  Clinician: [name] Referring Krysten Veronica: Treating Iva Posten/Extender: [name], [name] in Treatment: 19 Encounter Discharge Information Items Discharge Condition: Stable Ambulatory Status: Wheelchair Discharge Destination: Home Transportation: Private Auto Accompanied By: self Schedule Follow-up Appointment: No Clinical Summary of Care: Electronic Signature(s) Signed: 08/26/2022 12:43:00 PM By: [name] CHT EMT BS , , Entered By: [name] on 08/26/2022 12:42:59 [name] ([id number] Page 2 of 2 -------------------------------------------------------------------------------- Vitals Details Patient Name: Date of Service: [name], [name] 08/26/2022 8:00 A M Medical Record Number: [id number] Patient Account Number: [id number] Date of Birth/Sex: Treating RN: 01/29/1956 (67 y.o. [name] Primary Care Cailyn Houdek: [name] Other Clinician: [name] Referring Dominik Yordy: Treating Jackey Housey/Extender: [name], [name] in Treatment: 19 Vital Signs Time Taken: 07:48 Temperature (F): 97.5 Height (in): 72 Pulse (bpm): 84 Weight (lbs): 220 Respiratory Rate (breaths/min): 18 Body Mass Index (BMI): 29.8 Blood Pressure (mmHg): 140/80 Capillary Blood Glucose (mg/dl): 212 Reference Range: 80 - 120 mg / dl Electronic Signature(s) Signed: 08/26/2022 12:28:00 PM By: [name] CHT EMT BS , , Entered By: [name] on 08/26/2022 12:28:00

## 2022-08-27 NOTE — Progress Notes (Addendum)
[name] [name] ([id number]) [id number] Page 1 of 2 Visit Report for 08/26/2022 HBO Details Patient Name: Date of Service: [name] [name], [name] [name]. 08/26/2022 8:00 [name] Medical Record Number: [id number] Patient Account Number: [id number] Date of Birth/Sex: Treating RN: 1955-11-15 (67 y.o. [name] Primary Care [name] [name]: [name] [name] Other Clinician: [name] Referring [name] [name]: Treating Indianna Boran/Extender: [name], [name] in Treatment: 19 HBO Treatment Course Details Treatment Course Number: 1 Ordering Maurene Hollin: [name] T Treatments Ordered: otal 80 HBO Treatment Start Date: 06/01/2022 HBO Indication: Diabetic Ulcer(s) of the Lower Extremity Standard/Conservative Wound Care tried and failed greater than or equal to 30 days HBO Treatment Details Treatment Number: 52 Patient Type: Outpatient Chamber Type: Monoplace Chamber Serial #: [id number] Treatment Protocol: 2.5 ATA with 90 minutes oxygen, with two 5 minute air breaks Treatment Details Compression Rate Down: 1.5 psi / minute De-Compression Rate Up: 2.0 psi / minute [name] breaks and breathing ir Compress Tx Pressure periods Decompress Decompress Begins Reached (leave unused spaces Begins Ends blank) Chamber Pressure (ATA 1 2.5 2.5 2.5 2.5 2.5 - - 2.5 1 ) Clock Time (24 hr) 07:59 08:14 08:44 08:49 09:19 09:24 - - 09:54 10:05 Treatment Length: 126 (minutes) Treatment Segments: 4 Vital Signs Capillary Blood Glucose Reference Range: 80 - 120 mg / dl HBO Diabetic Blood Glucose Intervention Range: <131 mg/dl or >249 mg/dl Type: Time Vitals Blood Respiratory Capillary Blood Glucose Pulse Action Pulse: Temperature: Taken: Pressure: Rate: Glucose (mg/dl): Meter #: Oximetry (%) Taken: Pre 07:48 140/80 84 18 97.5 212 1 none per protocol Post 10:07 151/97 66 18 97.3 114 1 none per protocol Treatment Response Treatment Toleration: Well Treatment Completion  Status: Treatment Completed without Adverse Event Additional Procedure Documentation Tissue Sevierity: Necrosis of bone Electronic Signature(s) Signed: 08/26/2022 12:41:55 PM By: [name] [name] CHT EMT BS , , Signed: 08/26/2022 5:24:38 PM By: Worthy Keeler PA-C Entered By: [name] [name] on 08/26/2022 12:41:55 HBO Safety Checklist Details -------------------------------------------------------------------------------- [name] [name] ([id number]) [phone].pdf Page 2 of 2 Patient Name: Date of Service: [name] [name], [name] [name]. 08/26/2022 8:00 [name] Medical Record Number: [id number] Patient Account Number: [id number] Date of Birth/Sex: Treating RN: 10-23-1955 (67 y.o. [name] Primary Care [name] [name]: [name] [name] Other Clinician: [name] Referring [name] [name]: Treating [name] [name]/Extender: [name], [name] in Treatment: 19 HBO Safety Checklist Items Safety Checklist Consent Form Signed Patient voided / foley secured and emptied When did you last eato 0700 Last dose of injectable or oral agent 0630 Ostomy pouch emptied and vented if applicable NA All implantable devices assessed, documented and approved NA Intravenous access site secured and place NA Valuables secured Linens and cotton and cotton/polyester blend (less than 51% polyester) Personal oil-based products / skin lotions / body lotions removed Wigs or hairpieces removed NA Smoking or tobacco materials removed NA Books / newspapers / magazines / loose paper removed Cologne, aftershave, perfume and deodorant removed Jewelry removed (may wrap wedding band) Make-up removed Hair care products removed Battery operated devices (external) removed Heating patches and chemical warmers removed Titanium eyewear removed Nail polish cured greater than 10 hours NA Casting material cured greater than 10 hours NA Hearing aids removed NA Loose dentures or partials  removed Prosthetics have been removed NA Patient demonstrates correct use of air break device (if applicable) Patient concerns have been addressed Patient grounding bracelet on and cord attached to chamber Specifics for Inpatients (complete in addition to above) Medication sheet sent with patient NA  Intravenous medications needed or due during therapy sent with patient NA Drainage tubes (e.g. nasogastric tube or chest tube secured and vented) NA Endotracheal or Tracheotomy tube secured NA Cuff deflated of air and inflated with saline NA Airway suctioned NA Notes Paper version used prior to treatment start. Electronic Signature(s) Signed: 08/26/2022 12:40:15 PM By: [name] [name] CHT EMT BS , , Entered By: [name] [name] on 08/26/2022 12:40:14

## 2022-08-28 ENCOUNTER — Encounter (HOSPITAL_BASED_OUTPATIENT_CLINIC_OR_DEPARTMENT_OTHER): Payer: Medicare HMO | Admitting: Internal Medicine

## 2022-08-28 DIAGNOSIS — L97528 Non-pressure chronic ulcer of other part of left foot with other specified severity: Secondary | ICD-10-CM

## 2022-08-28 DIAGNOSIS — E11621 Type 2 diabetes mellitus with foot ulcer: Secondary | ICD-10-CM

## 2022-08-28 DIAGNOSIS — L97522 Non-pressure chronic ulcer of other part of left foot with fat layer exposed: Secondary | ICD-10-CM | POA: Diagnosis not present

## 2022-08-28 DIAGNOSIS — M86172 Other acute osteomyelitis, left ankle and foot: Secondary | ICD-10-CM

## 2022-08-28 LAB — GLUCOSE, CAPILLARY
Glucose-Capillary: 143 mg/dL — ABNORMAL HIGH (ref 70–99)
Glucose-Capillary: 141 mg/dL — ABNORMAL HIGH (ref 70–99)
Glucose-Capillary: 85 mg/dL (ref 70–99)

## 2022-08-28 NOTE — Progress Notes (Addendum)
HATIM, KUECKER A ([id number]) [id number] Page 1 of 2 Visit Report for 08/27/2022 HBO Details Patient Name: Date of Service: [name], [name] 08/27/2022 8:00 A M Medical Record Number: [id number] Patient Account Number: [id number] Date of Birth/Sex: Treating RN: 01/02/1956 (67 y.o. [name] Primary Care Marycarmen Hagey: [name] Other Clinician: [name] Referring Rochel Privett: Treating Paytan Recine/Extender: [name] in Treatment: 19 HBO Treatment Course Details Treatment Course Number: 1 Ordering Omer Monter: [name] T Treatments Ordered: otal 80 HBO Treatment Start Date: 06/01/2022 HBO Indication: Diabetic Ulcer(s) of the Lower Extremity Standard/Conservative Wound Care tried and failed greater than or equal to 30 days HBO Treatment Details Treatment Number: 53 Patient Type: Outpatient Chamber Type: Monoplace Chamber Serial #: [id number] Treatment Protocol: 2.5 ATA with 90 minutes oxygen, with two 5 minute air breaks Treatment Details Compression Rate Down: 1.5 psi / minute De-Compression Rate Up: 2.0 psi / minute A breaks and breathing ir Compress Tx Pressure periods Decompress Decompress Begins Reached (leave unused spaces Begins Ends blank) Chamber Pressure (ATA 1 2.5 2.5 2.5 2.5 2.5 - - 2.5 1 ) Clock Time (24 hr) 08:09 08:26 08:56 09:01 09:31 09:36 - - 10:06 10:17 Treatment Length: 128 (minutes) Treatment Segments: 4 Vital Signs Capillary Blood Glucose Reference Range: 80 - 120 mg / dl HBO Diabetic Blood Glucose Intervention Range: <131 mg/dl or >249 mg/dl Time Capillary Blood Glucose Pulse Blood Respiratory Action Type: Vitals Pulse: Temperature: Glucose Oximetry Pressure: Rate: Meter #: Taken: Taken: (mg/dl): (%) Pre 07:39 143/80 89 18 98.2 133 Post 10:21 158/96 79 18 97.3 83 1 Given 8 oz Glucerna and 2x 4oz Juice Given 8 oz Glucerna and 2x 4oz Juice per Dr. Ginette Pitman 07:59  132 Hoffman Treatment Response Treatment Toleration: Well Treatment Completion Status: Treatment Completed without Adverse Event Treatment Notes Patient arrived for treatment, blood glucose measured 133 mg/dL. Patient prepared for treatment. [place], blood glucose was measured again 20 minutes later with a result of 132 mg/dL. For safety patient was given 8 oz Glucerna and 2 x 4 oz Orange Juice per standing order from Dr. Heber Ruth. After safety check patient was placed in the chamber which was compressed with 100% oxygen at a rate of 1 psi/min until confirming normal ear equalization at which time rate set was increased to 2 psi/min for remaining travel to treatment depth of 2.5 ATA. Patient tolerated treatment and decompression of the chamber at a rate of 2 psi/min. Post-treatment vital signs were normal with the exception of blood glucose which was 83 mg/dL. Patient was given 8 oz Glucerna and 2 x 4 oz Apple Juice prior to discharge to clinic for wound care appointment. Patient was stable upon discharge from HBO. Additional Procedure Documentation Tissue Sevierity: Necrosis of bone Physician HBO Attestation: I certify that I supervised this HBO treatment in accordance with Medicare guidelines. A trained emergency response team is readily available per Yes hospital policies and procedures. Continue HBOT as ordered. DONDRE, ROZAK A ([id number]) [id number] Page 2 of 2 Electronic Signature(s) Signed: 08/28/2022 12:04:24 PM By: [name] DO Previous Signature: 08/27/2022 11:07:16 AM Version By: [name] CHT EMT BS , , Previous Signature: 08/27/2022 11:46:05 AM Version By: [name] DO Previous Signature: 08/27/2022 11:01:26 AM Version By: [name] CHT EMT BS , , Entered By: [name] on 08/28/2022 12:03:15 -------------------------------------------------------------------------------- HBO Safety Checklist Details Patient  Name: Date of Service: [name], [place] [name] 08/27/2022 8:00 A M Medical Record Number: [id number] Patient Account Number: [id number]  Date of Birth/Sex: Treating RN: 16-Aug-1955 (67 y.o. [name] Primary Care Million Maharaj: [name] Other Clinician: [name] Referring Aleia Larocca: Treating Jmya Uliano/Extender: [name] in Treatment: 19 HBO Safety Checklist Items Safety Checklist Consent Form Signed Patient voided / foley secured and emptied When did you last eato 0700 Last dose of injectable or oral agent 0630 Ostomy pouch emptied and vented if applicable NA All implantable devices assessed, documented and approved NA Intravenous access site secured and place NA Valuables secured Linens and cotton and cotton/polyester blend (less than 51% polyester) Personal oil-based products / skin lotions / body lotions removed Wigs or hairpieces removed NA Smoking or tobacco materials removed NA Books / newspapers / magazines / loose paper removed Cologne, aftershave, perfume and deodorant removed Jewelry removed (may wrap wedding band) Make-up removed NA Hair care products removed Battery operated devices (external) removed Heating patches and chemical warmers removed Titanium eyewear removed Nail polish cured greater than 10 hours NA Casting material cured greater than 10 hours NA Hearing aids removed NA Loose dentures or partials removed NA Prosthetics have been removed NA Patient demonstrates correct use of air break device (if applicable) Patient concerns have been addressed Patient grounding bracelet on and cord attached to chamber Specifics for Inpatients (complete in addition to above) Medication sheet sent with patient NA Intravenous medications needed or due during therapy sent with patient NA Drainage tubes (e.g. nasogastric tube or chest tube secured and vented) NA Endotracheal or Tracheotomy tube secured NA Cuff  deflated of air and inflated with saline NA Airway suctioned NA Notes Paper version used prior to treatment start. Electronic Signature(s) Signed: 08/27/2022 10:58:48 AM By: [name] CHT EMT BS , , Entered By: [name] on 08/27/2022 10:58:48

## 2022-08-28 NOTE — Progress Notes (Signed)
BRONISLAUS, JEFF A ([id number]) [id number] Page 1 of 2 Visit Report for 08/27/2022 Arrival Information Details Patient Name: Date of Service: [name], [name] 08/27/2022 8:00 A M Medical Record Number: [id number] Patient Account Number: [id number] Date of Birth/Sex: Treating RN: 06-Nov-1955 (67 y.o. [name] Primary Care Haley Roza: [name] Other Clinician: [name] Referring Tymira Horkey: Treating Kahlia Lagunes/Extender: [name] in Treatment: 19 Visit Information History Since Last Visit All ordered tests and consults were completed: Yes Patient Arrived: Cane Added or deleted any medications: No Arrival Time: 07:30 Any new allergies or adverse reactions: No Accompanied By: self Had a fall or experienced change in No Transfer Assistance: None activities of daily living that may affect Patient Identification Verified: Yes risk of falls: Secondary Verification Process Completed: Yes Signs or symptoms of abuse/neglect since last visito No Patient Requires Transmission-Based Precautions: No Hospitalized since last visit: No Patient Has Alerts: Yes Implantable device outside of the clinic excluding No cellular tissue based products placed in the center since last visit: Pain Present Now: No Electronic Signature(s) Signed: 08/27/2022 10:55:10 AM By: [name] CHT EMT BS , , Entered By: [name] on 08/27/2022 10:55:10 -------------------------------------------------------------------------------- Encounter Discharge Information Details Patient Name: Date of Service: [name], [name] 08/27/2022 8:00 A M Medical Record Number: [id number] Patient Account Number: [id number] Date of Birth/Sex: Treating RN: 1956-04-11 (67 y.o. [name] Primary Care Blanca Carreon: [name] Other Clinician: [name] Referring Aydenn Gervin: Treating Tailor Lucking/Extender: [name] in Treatment: 19 Encounter Discharge Information Items Discharge Condition: Stable Ambulatory Status: Wheelchair Discharge Destination: Other (Note Required) Transportation: Other Accompanied By: staff Schedule Follow-up Appointment: No Clinical Summary of Care: Notes Patient had wound care encounter after HBOT today. Electronic Signature(s) Signed: 08/27/2022 11:02:37 AM By: [name] CHT EMT BS , , Entered By: [name] on 08/27/2022 11:02:37 [name] ([id number]) [phone].pdf Page 2 of 2 -------------------------------------------------------------------------------- Vitals Details Patient Name: Date of Service: [name], [name] 08/27/2022 8:00 A M Medical Record Number: [id number] Patient Account Number: [id number] Date of Birth/Sex: Treating RN: Sep 11, 1955 (67 y.o. [name] Primary Care Ayan Yankey: [name] Other Clinician: [name] Referring Gaye Scorza: Treating Yi Falletta/Extender: [name] in Treatment: 19 Vital Signs Time Taken: 07:39 Temperature (F): 98.2 Height (in): 72 Pulse (bpm): 89 Weight (lbs): 220 Respiratory Rate (breaths/min): 18 Body Mass Index (BMI): 29.8 Blood Pressure (mmHg): 143/80 Capillary Blood Glucose (mg/dl): 133 Reference Range: 80 - 120 mg / dl Electronic Signature(s) Signed: 08/27/2022 10:58:03 AM By: [name] CHT EMT BS , , Entered By: [name] on 08/27/2022 10:58:03

## 2022-08-28 NOTE — Progress Notes (Signed)
LIUM, KULPA A ([id number]) [id number] Page 1 of 1 Visit Report for 08/27/2022 SuperBill Details Patient Name: Date of Service: [name] 08/27/2022 Medical Record Number: [id number] Patient Account Number: [id number] Date of Birth/Sex: Treating RN: 27-Dec-1955 (67 y.o. [name] Primary Care Provider: [name] Other Clinician: [name] Referring Provider: Treating Provider/Extender: [name] in Treatment: 19 Diagnosis Coding ICD-10 Codes Code Description [phone] Non-pressure chronic ulcer of other part of left foot with fat layer exposed M86.172 Other acute osteomyelitis, left ankle and foot L97.528 Non-pressure chronic ulcer of other part of left foot with other specified severity E11.621 Type 2 diabetes mellitus with foot ulcer E11.42 Type 2 diabetes mellitus with diabetic polyneuropathy S91.301A Unspecified open wound, right foot, initial encounter 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 severity Facility Procedures CPT4 Code Description Modifier Quantity [id number] G0277-(Facility Use Only) HBOT full body chamber, 50mn , 4 ICD-10 Diagnosis Description E11.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 M86.172 Other acute osteomyelitis, left ankle and foot Physician Procedures Quantity CPT4 Code Description Modifier [id number] - WC PHYS HYPERBARIC OXYGEN THERAPY 1 ICD-10 Diagnosis Description E11.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

## 2022-08-28 NOTE — Progress Notes (Signed)
CRAIGE, RZEPKA A ([id number]) [id number] Page 1 of 1 Visit Report for 08/26/2022 SuperBill Details Patient Name: Date of Service: [name] 08/26/2022 Medical Record Number: [id number] Patient Account Number: [id number] Date of Birth/Sex: Treating RN: 1955/10/27 (67 y.o. 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 S91.301A Unspecified open wound, right foot, initial encounter 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 severity Facility Procedures CPT4 Code Description Modifier Quantity [id number] G0277-(Facility Use Only) HBOT full body chamber, 28mn , 4 ICD-10 Diagnosis Description E11.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 M86.172 Other acute osteomyelitis, left ankle and foot Physician Procedures Quantity CPT4 Code Description Modifier [id number] - WC PHYS HYPERBARIC OXYGEN THERAPY 1 ICD-10 Diagnosis Description E11.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

## 2022-08-29 NOTE — Progress Notes (Signed)
BRIT, HERMOSILLO A ([id number]) [id number] Page 1 of 2 Visit Report for 08/28/2022 Arrival Information Details Patient Name: Date of Service: [name], [name] 08/28/2022 8:00 A M Medical Record Number: [id number] Patient Account Number: [id number] Date of Birth/Sex: Treating RN: [birth year] (67 y.o. [name] Primary Care Bryttney Netzer: [name] Other Clinician: [name] Referring Rowdy Guerrini: Treating Mikai Meints/Extender: [name] in Treatment: 19 Visit Information History Since Last Visit All ordered tests and consults were completed: Yes Patient Arrived: Cane Added or deleted any medications: No Arrival Time: 07:15 Any new allergies or adverse reactions: No Accompanied By: self Had a fall or experienced change in No Transfer Assistance: None activities of daily living that may affect Patient Identification Verified: Yes risk of falls: Secondary Verification Process Completed: Yes Signs or symptoms of abuse/neglect since last visito No Patient Requires Transmission-Based Precautions: No Hospitalized since last visit: No Patient Has Alerts: Yes Implantable device outside of the clinic excluding No cellular tissue based products placed in the center since last visit: Pain Present Now: No Electronic Signature(s) Signed: 08/28/2022 10:25:31 AM By: [name] CHT EMT BS , , Entered By: [name] on 08/28/2022 10:25:31 -------------------------------------------------------------------------------- Encounter Discharge Information Details Patient Name: Date of Service: [name], [name] 08/28/2022 8:00 A M Medical Record Number: [id number] Patient Account Number: [id number] Date of Birth/Sex: Treating RN: May 13, 1956 (67 y.o. [name] Primary Care Mili Piltz: [name] Other Clinician: Valeria Batman Referring Josealberto Montalto: Treating Malakie Balis/Extender: [name]  in Treatment: 19 Encounter Discharge Information Items Discharge Condition: Stable Ambulatory Status: Wheelchair Discharge Destination: Home Transportation: Private Auto Accompanied By: self Schedule Follow-up Appointment: No Clinical Summary of Care: Electronic Signature(s) Signed: 08/28/2022 12:04:38 PM By: [name] CHT EMT BS , , Entered By: [name] on 08/28/2022 12:04:37 [name] ([id number].pdf Page 2 of 2 -------------------------------------------------------------------------------- Vitals Details Patient Name: Date of Service: [name], [name] 08/28/2022 8:00 A M Medical Record Number: [id number] Patient Account Number: [id number] Date of Birth/Sex: Treating RN: December 05, 1955 (67 y.o. [name] Primary Care Kyesha Balla: [name] Other Clinician: [name] Referring Norma Ignasiak: Treating [name]/Extender: [name] in Treatment: 19 Vital Signs Time Taken: 07:29 Temperature (F): 98.3 Height (in): 72 Pulse (bpm): 79 Weight (lbs): 220 Respiratory Rate (breaths/min): 18 Body Mass Index (BMI): 29.8 Blood Pressure (mmHg): 139/87 Capillary Blood Glucose (mg/dl): 143 Reference Range: 80 - 120 mg / dl Electronic Signature(s) Signed: 08/28/2022 10:36:13 AM By: [name] CHT EMT BS , , Entered By: [name] on 08/28/2022 10:36:13

## 2022-08-29 NOTE — Progress Notes (Signed)
[name] ([id number]) [id number] Page 1 of 15 Visit Report for 08/27/2022 Chief Complaint Document Details Patient Name: Date of Service: [name], [name]. 08/27/2022 10:15 [name] Medical Record Number: [id number] Patient Account Number: [id number] Date of Birth/Sex: Treating RN: 27-Feb-1956 (67 y.o. M) Primary Care Provider: [name] Other Clinician: Referring Provider: Treating Provider/Extender: [name] in Treatment: 19 Information Obtained from: Patient Chief Complaint 10/16/20; patient is here with [name] substantial wound on his right foot in the setting of [name] recent transmetatarsal amputation. 04/14/2022; referral from podiatry for postsurgical wound of nonhealing partial left fifth ray amputation, right foot (07/16/22): TMA dehiscence Electronic Signature(s) Signed: 08/27/2022 11:46:05 AM By: [name] DO Entered By: [name] on 08/27/2022 11:14:24 -------------------------------------------------------------------------------- Cellular or Tissue Based Product Details Patient Name: Date of Service: [name], [name] 08/27/2022 10:15 [name] Medical Record Number: [id number] Patient Account Number: [id number] Date of Birth/Sex: Treating RN: 18-Aug-1955 (67 y.o. [name] Primary Care Provider: [name] Other Clinician: Referring Provider: Treating Provider/Extender: [name] in Treatment: 19 Cellular or Tissue Based Product Type Wound #5 Left,Lateral Foot Applied to: Performed By: Physician [name], DO Cellular or Tissue Based Product Type: Grafix prime Level of Consciousness (Pre-procedure): Awake and Alert Pre-procedure Verification/Time Out Yes - 11:10 Taken: Location: genitalia / hands / feet / multiple digits Wound Size (sq cm): 1.2 Product Size (sq cm): 4 Waste Size (sq cm): 2 Waste Reason: wound size Amount of Product Applied (sq cm):  2 Instrument Used: Forceps, Scissors Lot #: [id number] Order #: 12 Expiration Date: 12/23/2023 Fenestrated: No Reconstituted: Yes Solution Type: Normal saline Solution Amount: 58m Lot #: [id number] Expiration Date: 12/03/2024 Secured: Yes Secured With: SOtho PerlDressing Applied: No Procedural Pain: 0 Post Procedural Pain: 0 Response to Treatment: Procedure was tolerated well TJACERE, TORTORELLAA ([id number] [phone] Page 2 of 15 Level of Consciousness (Post- Awake and Alert procedure): Post Procedure Diagnosis Same as Pre-procedure Electronic Signature(s) Signed: 08/27/2022 11:46:05 AM By: HKalman ShanDO Signed: 08/28/2022 12:41:16 PM By: DDeon PillingRN, [name] Entered By: DDeon Pillingon 08/27/2022 11:09:26 -------------------------------------------------------------------------------- Cellular or Tissue Based Product Details Patient Name: Date of Service: [name] [name] 08/27/2022 10:15 [name] Medical Record Number: [id number] Account Number: [id number] of Birth/Sex: Treating RN: 108-Sep-1957(67y.o. MHessie DienerPrimary Care Provider: BRoselee NovaOther Clinician: Referring Provider: Treating Provider/Extender: HBurgess Amorin Treatment: 19 Cellular or Tissue Based Product Type Wound #6 Right Amputation Site - Transmetatarsal Applied to: Performed By: Physician HKalman Shan DO Cellular or Tissue Based Product Type: Grafix prime Level of Consciousness (Pre-procedure): Awake and Alert Pre-procedure Verification/Time Out Yes - 11:10 Taken: Location: genitalia / hands / feet / multiple digits Wound Size (sq cm): 0.12 Product Size (sq cm): 2 Waste Size (sq cm): 0 Amount of Product Applied (sq cm): 2 Instrument Used: Forceps, Scissors Lot #: [id number] #: 12 Expiration Date: 12/23/2023 Fenestrated: No Reconstituted: Yes Solution Type: Normal saline Solution  Amount: 139mLot #: [id number] Expiration Date: 12/03/2024 Secured: Yes Secured With: Steri-Strips, SORBACT Dressing Applied: No Procedural Pain: 0 Post Procedural Pain: 0 Response to Treatment: Procedure was tolerated well Level of Consciousness (Post- Awake and Alert procedure): Post Procedure Diagnosis Same as Pre-procedure Electronic Signature(s) Signed: 08/27/2022 11:46:05 AM By: HoKalman ShanO Signed: 08/28/2022 12:41:16 PM By: DeDeon PillingN, [name] Entered By: DeDeon Pillingn 08/27/2022 11:09:45 TAPeri Jefferson  [name] ([id number] Page 3 of 15 -------------------------------------------------------------------------------- Debridement Details Patient Name: Date of Service: [name], [name] 08/27/2022 10:15 [name] Medical Record Number: [id number] Patient Account Number: [id number] Date of Birth/Sex: Treating RN: 1956/01/15 (67 y.o. [name], [name] Primary Care Provider: [name] Other Clinician: Referring Provider: Treating Provider/Extender: [name] in Treatment: 19 Debridement Performed for Assessment: Wound #6 Right Amputation Site - Transmetatarsal Performed By: Physician [name], DO Debridement Type: Debridement Severity of Tissue Pre Debridement: Fat layer exposed Level of Consciousness (Pre-procedure): Awake and Alert Pre-procedure Verification/Time Out Yes - 11:00 Taken: Start Time: 11:01 Pain Control: Lidocaine 5% topical ointment T Area Debrided (L x W): otal 1 (cm) x 1 (cm) = 1 (cm) Tissue and other material debrided: Viable, Non-Viable, Callus, Slough, Subcutaneous, Skin: Dermis , Skin: Epidermis, Slough Level: Skin/Subcutaneous Tissue Debridement Description: Excisional Instrument: Curette Bleeding: Minimum End Time: 11:05 Procedural Pain: 0 Post Procedural Pain: 0 Response to Treatment: Procedure was tolerated well Level of Consciousness (Post- Awake and  Alert procedure): Post Debridement Measurements of Total Wound Length: (cm) 0.4 Width: (cm) 0.3 Depth: (cm) 0.3 Volume: (cm) 0.028 Character of Wound/Ulcer Post Debridement: Improved Severity of Tissue Post Debridement: Fat layer exposed Post Procedure Diagnosis Same as Pre-procedure Electronic Signature(s) Signed: 08/27/2022 11:46:05 AM By: [name] DO Signed: 08/28/2022 12:41:16 PM By: [name] RN, [name] Entered By: [name] on 08/27/2022 11:05:30 -------------------------------------------------------------------------------- Debridement Details Patient Name: Date of Service: [name], [name] 08/27/2022 10:15 [name] Medical Record Number: [id number] Patient Account Number: [id number] Date of Birth/Sex: Treating RN: 1955/12/11 (67 y.o. [name] Primary Care Provider: [name] Other Clinician: Referring Provider: Treating Provider/Extender: [name] in Treatment: 19 Debridement Performed for Assessment: Wound #5 Left,Lateral Foot Performed By: Physician [name], DO Debridement Type: Debridement Severity of Tissue Pre Debridement: Fat layer exposed Level of Consciousness (Pre-procedure): Awake and Alert Pre-procedure Verification/Time Out Yes - 11:00 Taken: Start Time: 11:01 Pain Control: Lidocaine 5% topical ointment T Area Debrided (L x W): otal 4 (cm) x 0.3 (cm) = 1.2 (cm) Tissue and other material debrided: Viable, Non-Viable, Callus, Slough, Subcutaneous, Skin: Dermis , Skin: Epidermis, Slough Level: Skin/Subcutaneous Tissue Debridement Description: Excisional HOYLE, MONTERO [name] ([id number]) [phone].pdf Page 4 of 15 Instrument: Curette Bleeding: Minimum End Time: 11:05 Procedural Pain: 0 Post Procedural Pain: 0 Response to Treatment: Procedure was tolerated well Level of Consciousness (Post- Awake and Alert procedure): Post Debridement Measurements of Total  Wound Length: (cm) 4 Width: (cm) 0.3 Depth: (cm) 0.2 Volume: (cm) 0.188 Character of Wound/Ulcer Post Debridement: Improved Severity of Tissue Post Debridement: Fat layer exposed Post Procedure Diagnosis Same as Pre-procedure Electronic Signature(s) Signed: 08/27/2022 11:46:05 AM By: [name] DO Signed: 08/28/2022 12:41:16 PM By: [name] RN, [name] Entered By: [name] on 08/27/2022 11:06:14 -------------------------------------------------------------------------------- HPI Details Patient Name: Date of Service: [name], [name] 08/27/2022 10:15 [name] Medical Record Number: [id number] Patient Account Number: [id number] Date of Birth/Sex: Treating RN: 1955-08-12 (67 y.o. M) Primary Care Provider: [name] Other Clinician: Referring Provider: Treating Provider/Extender: [name], [name] in Treatment: 19 History of Present Illness HPI Description: ADMISSION 10/16/20 This is [name] 67 year old man who is [name] type II diabetic. Initially seen by Dr. Unk Lightning of vein and vascular on 09/10/2021 for bilateral lower extremity rest pain right greater than left. His ABIs demonstrated monophasic waveforms at the ankles bilaterally with depressed toe pressures. His ABI in the  right was 0.5 and on the left 0.28. There were no obtainable waveforms for TBI's. He underwent angiography on 09/10/2021 and had findings of severe PAD with 95% stenosis of the distal SFA. He also had peroneal and posterior tibial arteries occluded with significant collateralization in the calf there was reconstitution of the posterior tibial artery in the distal third of the calf. Ostia of the anterior tibial artery was not appreciated. He underwent [name] angioplasty of the superficial femoral artery. He required admission to hospital from 09/12/2021 through 09/22/2021 with right foot cellulitis and sepsis. On 3/13 he underwent [name] right below-knee popliteal to posterior tibial bypass using [name] greater  saphenous vein. Ultimately however he required [name] right TMA by podiatry which I believe was done on 3/17 for progressive diabetic foot infection. He was discharged to [facility] skilled facility. He arrives in clinic today with [name] large wound area over the entirety of his amputation site extending proximally. The attempted flap closure of the amputation site and his TMA has failed and the wound extends under the attempt to closure. Still some sutures in place. There is an odor but he is not systemically unwell. He is due for follow-up arterial studies and has an appointment with vascular surgery on 10/28/2021. They are using wet-to-dry dressings at [facility]. Past medical history includes type 2 diabetes with peripheral neuropathy, hypertension, obstructive sleep apnea 4/20; patient presents for follow-up. He is being discharged from his facility at [facility] today. They have been doing Dakin's wet-to-dry dressings. He states that his fiance can help with dressing changes. He is getting [facility] home [facility] to come out 3 times [name] week once he leaves the facility. He is scheduled to see vein and vascular on 5/12. He has not seen the wound himself. He puts covers over his face for wound exams. He currently denies systemic signs of infection. 4/28; patient admitted to the clinic 2 weeks ago with [name] dehisced right TMA in the setting of type 2 diabetes with significant PAD. He is status post revascularization. He has been discharged from the nursing home he was at and now is at home using Dakin's wet-to-dry dressings daily he has home health. He denies any systemic signs of infection 5/4; patient presents for follow-up. His fiance and home health have been changing his dressings. He currently denies systemic signs of infection. He continues to put sheet covers over his face during our wound encounters because he does not want [name] look at the wound. 5/25; Patient presents for follow-up. He missed his last clinic  appointment. He had [name] bone biopsy and culture done at last clinic visit that showed acute osteomyelitis with growth of E. coli, stenotrophomonas maltophilia and enteroccocus faecalis. [name] referral to infectious disease was made. He has not heard from them yet. He has been using Dakin's wet-to-dry dressings. He is now more comfortable with looking at the wound bed. 6/6; the patient comes into clinic today with 2 wounds on the left foot which apparently have been there for several months. This was not known to assess but TYKWAN, IRENE [name] ([id number]) [id number] Page 5 of 15 was known by Dr. Donzetta Matters. In fact he underwent an angiogram yesterday. He had [name] laser atherectomy of the left posterior tibial artery with [name] 3 mm balloon angioplasty, also [name] stent of the last left SFA. 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 This is [name] lot better than the last time I saw it. He has been using Dakin's wet-to-dry. He has an appointment with infectious disease for [name] bone biopsy which showed E. coli, stenotrophomonas and Enterococcus faecalis. The appointment is for tomorrow 04/14/2022 Mr. Avneesh Salonga ajuddin is an uncontrolled type 2 diabetic on insulin that presents with [name] left foot ulcer. On 12/18/2021 he required [name] left fifth ray amputation. He had revision of this site on 6/17 by Dr. Posey Pronto, podiatry. He states he has had [name] wound VAC on this area for the past 3 weeks. It is unclear what the prior wound care has been. He has [name] history of osteomyelitis to this area and was treated with IV and oral antibiotics For 6 weeks by Dr. Linus Salmons of infectious disease. At his last follow-up on 02/20/2022 his CRP was trending down and antibiotics were stopped. He also has [name] history of peripheral arterial disease status post stenting to the left SFA and  balloon angioplasty to the left posterior tibial artery On 12/2021. He has follow-up with vein and vascular at the end of the month. He also has [name] wound to the dorsal aspect of the left foot that he has been placing Betadine on. 10/19; patient presents for follow-up. He had [name] bone biopsy of the left foot at last clinic visit showed osteomyelitis. The bone culture showed Enterobacter cloacae, Enterococcus faecalis, Staphylococcus aureus, viridans group strep, actinotigum schaelii. He has been referred to infectious disease And has an appointment on October 25. He has been using Dakin's wet-to-dry dressing to the lateral left foot wound and Hydrofera Blue and Medihoney to the dorsal foot wound. He has no issues or complaints today. He denies systemic signs of infection. 10/26; patient presents for follow-up. He saw Dr. Linus Salmons yesterday with infectious disease and has been prescribed doxycycline and Levaquin for his chronic osteomyelitis. He has been using Dakin's wet-to-dry dressings to the lateral left foot wound and Medihoney and Hydrofera Blue to the dorsal wound. We discussed [name] skin graft for the dorsal foot wound and he would like to proceed with insurance verification. 11/3; patient with [name] wound on the dorsal left foot and [name] substantial postsurgical area on the lateral left foot fifth ray amputation. He is also followed by Dr. Alton Revere of infectious disease. He had [name] bone biopsy from the left lateral foot that showed acute osteomyelitis. Swab culture showed multiple organisms. He has also been revascularized by infectious disease status post left SFA stenting and tibial laser atherectomy and angioplasty. An MRI of the left foot had previously shown osteomyelitis of the fifth met head we have been using Dakin's wet-to-dry to the left lateral foot and Medihoney and Hydrofera Blue to the wound on the dorsal foot. 11/13; HBO treatment was discussed at last clinic visit and patient would like to proceed with  this. He has orientation today. He has been approved for Grafix. We have been using Hydrofera Blue and Medihoney to the left dorsal foot and collagen to the left lateral foot. Patient has home health that comes in for dressing changes. He currently has no issues or complaints today. 12/7; patient missed his last clinic appointment. He was last seen 3 weeks ago. He is currently off of antibiotics per ID. He completed [name] prolonged treatment. He has been using collagen to the lateral foot wound. He has been using Medihoney and Hydrofera Blue to the dorsal foot wound. He has been doing well with  hyperbaric oxygen therapy. He has no issues or complaints today. He denies signs of of infection. 12/14; patient presents for follow-up. We have been using Grafix to the wound beds. He has had trouble equalizing the pressure in his ears during HBO treatments. It was recommended he follow-up with ENT He does not have an appointment yet. He currently denies signs of infection. . 12/21; patient presents for follow-up. He was seen by ENT and had tubes placed. He is continued HBO without issues. We have been using Grafix to the wound beds. There is been improvement in wound healing. 12/21; patient presents for follow-up. He has no issues or complaints today. There is been improvement in wound healing. 1/5; patient presents for follow-up. At last clinic visit Grafix was placed in standard fashion. He has no issues or complaints today. He is tolerating hyperbaric oxygen therapy well. He denies signs of infection. 1/11; patient presents for follow-up. We have been placing Grafix to the wound beds on the left foot. The dorsal left foot wound has healed. The left lateral foot wound has shown improvement in healing. Unfortunately has had dehiscence of his previous surgical wound site on the right foot. He currently denies signs of infection. 1/19; patient presents for follow-up. Grafix was placed to the left foot wound at  last clinic visit. He has been using Dakin's wet-to-dry dressings to the right foot wound. He continues HBO therapy without issues. He has shown improvement in wound healing to the left foot. 1/26; patient presents for follow-up. We been placing Grafix to the left foot wound and patient has been using Dakin's wet-to-dry dressings to the right foot wound. There is been improvement in wound healing. He continues HBO therapy without issues. 2/2; patient presents for follow-up. We have been using Grafix to the left foot wound and Dakin's wet-to-dry dressings to the right foot wound. He has no issues or complaints today. 2/8; patient presents for follow-up. We have been using Grafix to the left foot wound and Dakin's wet-to-dry dressings to the right foot wound. He has no issues or complaints today. He continues HBO without issues. 2/15; patient presents for follow-up. We have been using Grafix to the wound beds. He continues HBO without issues. He has been offloading the wound beds with his surgical shoe. 2/22; patient presents for follow-up. We have been using Grafix to the wound beds. He has no issues or complaints today. He continues HBO therapy without issues. Electronic Signature(s) Signed: 08/27/2022 11:46:05 AM By: [name] DO Entered By: [name] on 08/27/2022 11:15:32 -------------------------------------------------------------------------------- Physical Exam Details Patient Name: Date of Service: [name], [name] 08/27/2022 10:15 [name] Medical Record Number: [id number] Patient Account Number: [id number] Date of Birth/Sex: Treating RN: 30-Jan-1956 (67 y.o. Adelina Mings ([id number]) [id number] Page 6 of 15 Primary Care Provider: [name] Other Clinician: Referring Provider: Treating Provider/Extender: [name], [name] in Treatment: 19 Constitutional respirations regular, non-labored and within target range  for patient.. Cardiovascular 2+ dorsalis pedis/posterior tibialis pulses. Psychiatric pleasant and cooperative. Notes Left foot: T the lateral aspect there is an open wound with granulation tissue and nonviable tissue. No probing to bone . Right foot: Along the previous TMA o site there is an open wound with nonviable tissue, granulation tissue and callus. There is increased depth In the center. Does not probe to bone. No signs of surrounding soft tissue infection. Electronic Signature(s) Signed: 08/27/2022 11:46:05 AM By: [name] DO Entered By: [name] on 08/27/2022 11:16:48 --------------------------------------------------------------------------------  Physician Orders Details Patient Name: Date of Service: [name], [name]. 08/27/2022 10:15 [name] Medical Record Number: [id number] Patient Account Number: [id number] Date of Birth/Sex: Treating RN: 01/10/1956 (67 y.o. [name] Primary Care Provider: [name] Other Clinician: Referring Provider: Treating Provider/Extender: [name] in Treatment: [phone] Verbal / Phone Orders: No Diagnosis Coding Follow-up Appointments ppointment in 1 week. - Dr .Heber Bellmont Thursday after Hyperbaric 1015 overflow 09/03/2022 Return [name] ppointment in 2 weeks. - Dr. Heber Wellington Thursday 1100 09/10/2022 room 8 see after hyperbaric. Return [name] Other: - ****ONLY CHANGE OUTTER DRESSINGS ONLY.*** Anesthetic (In clinic) Topical Lidocaine 5% applied to wound bed Cellular or Tissue Based Products Cellular or Tissue Based Product Type: - Run IVR for Grafix for left dorsal foot- pending Grafix # 1 applied 06/11/22 Grafix # 2 applied 06/18/22 Grafix # 3 applied 06/25/22 GRafix # 4 applied 07/02/22 Grafix # 5 applied 07/10/22 Grafix # 6 applied 07/17/22 Grafix # 7 applied 07/24/22---Also, go ahead and run IVR for more Grafix for left lateral foot Grafix #8 applied 07/31/2022 Grafix # 9 applied 08/07/22] left  foot Grafix #10 applied 08/13/2022 left and right foot wounds today split the grafix in half. Graftx #11 applied to both wounds. Patient right foot approved for total of 12 between dates 08/18/2022-11/04/2022. Grafix #12 applied to both wounds. Cellular or Tissue Based Product applied to wound bed, secured with steri-strips, cover with Adaptic or Mepitel. (DO NOT REMOVE). Edema Control - Lymphedema / SCD / Other Avoid standing for long periods of time. Off-Loading Open toe surgical shoe to: - wear when standing or walking. Hyperbaric Oxygen Therapy Evaluate for HBO Therapy - 07/24/22- Pt. to finish next 8 HBO treatments 07/24/22- DR. Malena Timpone wants pt. to be extended for 40 more treatments Indication: - Wagner Grade III to left lateral foot 2.5 ATA for 90 Minutes with 2 Five (5) Minute [name] Breaks ir Total Number of Treatments: - 40; extend for 40 more= 80 treatments SOLOMONE, TYRIE [name] ([id number]) [phone].pdf Page 7 of 15 One treatments per day (delivered Monday through Friday unless otherwise specified in Special Instructions below): Finger stick Blood Glucose Pre- and Post- HBOT Treatment. Follow Hyperbaric Oxygen Glycemia Protocol Afrin (Oxymetazoline HCL) 0.05% nasal spray - 1 spray in both nostrils daily as needed prior to HBO treatment for difficulty clearing ears Wound Treatment Wound #5 - Foot Wound Laterality: Left, Lateral Cleanser: Wound Cleanser (Generic) 1 x Per Day/30 Days Discharge Instructions: Cleanse the wound with wound cleanser prior to applying [name] clean dressing using gauze sponges, not tissue or cotton balls. Peri-Wound Care: Skin Prep (Generic) 1 x Per Day/30 Days Discharge Instructions: Use skin prep as directed Prim Dressing: Grafix #12 (Generic) 1 x Per Day/30 Days ary Discharge Instructions: applied by provider. Prim Dressing: adaptic and steri-strips 1 x Per Day/30 Days ary Discharge Instructions: to secure grafix to wound bed. leave in  place. Secondary Dressing: ABD Pad, 8x10 (Generic) 1 x Per Day/30 Days Discharge Instructions: Apply over primary dressing as directed. Secured With: [employer], 4.5x3.1 (in/yd) (Generic) 1 x Per Day/30 Days Discharge Instructions: Secure with Kerlix as directed. Secured With: 65M Medipore H Soft Cloth Surgical T ape, 4 x 10 (in/yd) (Generic) 1 x Per Day/30 Days Discharge Instructions: Secure with tape as directed. Wound #6 - Amputation Site - Transmetatarsal Wound Laterality: Right Cleanser: Wound Cleanser (Generic) 1 x Per Day/30 Days Discharge Instructions: Cleanse the wound with wound cleanser prior to applying [name] clean dressing using gauze  sponges, not tissue or cotton balls. Peri-Wound Care: Skin Prep (Generic) 1 x Per Day/30 Days Discharge Instructions: Use skin prep as directed Prim Dressing: Grafix #3 (Generic) 1 x Per Day/30 Days ary Discharge Instructions: applied by provider. Prim Dressing: adaptic and steri-strips 1 x Per Day/30 Days ary Discharge Instructions: to secure grafix to wound bed. leave in place. Prim Dressing: cutimed sorbact 1 x Per Day/30 Days ary Discharge Instructions: lightly pack into wound bed behind the grafix. Secondary Dressing: ABD Pad, 8x10 (Generic) 1 x Per Day/30 Days Discharge Instructions: Apply over primary dressing as directed. Secured With: [employer], 4.5x3.1 (in/yd) (Generic) 1 x Per Day/30 Days Discharge Instructions: Secure with Kerlix as directed. Secured With: 49M Medipore H Soft Cloth Surgical T ape, 4 x 10 (in/yd) (Generic) 1 x Per Day/30 Days Discharge Instructions: Secure with tape as directed. GLYCEMIA INTERVENTIONS PROTOCOL PRE-HBO GLYCEMIA INTERVENTIONS ACTION INTERVENTION Obtain pre-HBO capillary blood glucose (ensure 1 physician order is in chart). [name]. Notify HBO physician and await physician orders. 2 If result is 70 mg/dl or below: B. If the result meets the hospital definition of [name] critical result, follow  hospital policy. [name]. Give patient an 8 ounce Glucerna Shake, an 8 ounce Ensure, or 8 ounces of [name] Glucerna/Ensure equivalent dietary supplement*. B. Wait 30 minutes. If result is 71 mg/dl to 130 mg/dl: C. Retest patients capillary blood glucose (CBG). D. If result greater than or equal to 110 mg/dl, proceed with HBO. If result less than 110 mg/dl, notify HBO physician and consider holding HBO. If result is 131 mg/dl to 249 mg/dl: [name]. Proceed with HBO. [name]. Notify HBO physician and await physician orders. B. It is recommended to hold HBO and do If result is 250 mg/dl or greater: blood/urine ketone testing. C. If the result meets the hospital definition of [name] KAREE, FAUSEY [name] ([id number]) [id number] Page 8 of 15 critical result, follow hospital policy. POST-HBO GLYCEMIA INTERVENTIONS ACTION INTERVENTION Obtain post HBO capillary blood glucose (ensure 1 physician order is in chart). [name]. Notify HBO physician and await physician orders. 2 If result is 70 mg/dl or below: B. If the result meets the hospital definition of [name] critical result, follow hospital policy. [name]. Give patient an 8 ounce Glucerna Shake, an 8 ounce Ensure, or 8 ounces of [name] Glucerna/Ensure equivalent dietary supplement*. B. Wait 15 minutes for symptoms of If result is 71 mg/dl to 100 mg/dl: hypoglycemia (i.e. nervousness, anxiety, sweating, chills, clamminess, irritability, confusion, tachycardia or dizziness). C. If patient asymptomatic, discharge patient. If patient symptomatic, repeat capillary blood glucose (CBG) and notify HBO physician. If result is 101 mg/dl to 249 mg/dl: [name]. Discharge patient. [name]. Notify HBO physician and await physician orders. B. It is recommended to do blood/urine ketone If result is 250 mg/dl or greater: testing. C. If the result meets the hospital definition of [name] critical result, follow hospital policy. *Juice or candies are NOT equivalent products.  If patient refuses the Glucerna or Ensure, please consult the hospital dietitian for an appropriate substitute. Electronic Signature(s) Signed: 08/27/2022 11:46:05 AM By: [name] DO Entered By: [name] on 08/27/2022 11:16:56 -------------------------------------------------------------------------------- Problem List Details Patient Name: Date of Service: [name], [name] 08/27/2022 10:15 [name] Medical Record Number: [id number] Patient Account Number: [id number] Date of Birth/Sex: Treating RN: 1956-06-02 (67 y.o. M) Primary Care Provider: [name] Other Clinician: Referring Provider: Treating Provider/Extender: [name] in Treatment: 19 Active Problems ICD-10 Encounter Code Description Active Date MDM Diagnosis [phone]  Non-pressure chronic ulcer of other part of left foot with fat layer exposed 04/14/2022 No Yes M86.172 Other acute osteomyelitis, left ankle and foot 05/08/2022 No Yes L97.528 Non-pressure chronic ulcer of other part of left foot with other specified 04/14/2022 No Yes severity E11.621 Type 2 diabetes mellitus with foot ulcer 04/14/2022 No Yes E11.42 Type 2 diabetes mellitus with diabetic polyneuropathy 04/14/2022 No Yes S91.301A Unspecified open wound, right foot, initial encounter 07/16/2022 No Yes TRINITY, HUYLER [name] ([id number]) [phone].pdf Page 9 of 15 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 07/16/2022 No Yes severity Inactive Problems Resolved Problems Electronic Signature(s) Signed: 08/27/2022 11:46:05 AM By: [name] DO Entered By: [name] on 08/27/2022 11:14:01 -------------------------------------------------------------------------------- Progress Note Details Patient Name: Date of Service: [name], [name] 08/27/2022 10:15 [name] Medical Record Number: [id number] Patient Account Number: [id number] Date of Birth/Sex:  Treating RN: 04-Jul-1956 (67 y.o. M) Primary Care Provider: [name] Other Clinician: Referring Provider: Treating Provider/Extender: [name], [name] in Treatment: 19 Subjective Chief Complaint Information obtained from Patient 10/16/20; patient is here with [name] substantial wound on his right foot in the setting of [name] recent transmetatarsal amputation. 04/14/2022; referral from podiatry for postsurgical wound of nonhealing partial left fifth ray amputation, right foot (07/16/22): TMA dehiscence History of Present Illness (HPI) ADMISSION 10/16/20 This is [name] 67 year old man who is [name] type II diabetic. Initially seen by Dr. Unk Lightning of vein and vascular on 09/10/2021 for bilateral lower extremity rest pain right greater than left. His ABIs demonstrated monophasic waveforms at the ankles bilaterally with depressed toe pressures. His ABI in the right was 0.5 and on the left 0.28. There were no obtainable waveforms for TBI's. He underwent angiography on 09/10/2021 and had findings of severe PAD with 95% stenosis of the distal SFA. He also had peroneal and posterior tibial arteries occluded with significant collateralization in the calf there was reconstitution of the posterior tibial artery in the distal third of the calf. Ostia of the anterior tibial artery was not appreciated. He underwent [name] angioplasty of the superficial femoral artery. He required admission to hospital from 09/12/2021 through 09/22/2021 with right foot cellulitis and sepsis. On 3/13 he underwent [name] right below-knee popliteal to posterior tibial bypass using [name] greater saphenous vein. Ultimately however he required [name] right TMA by podiatry which I believe was done on 3/17 for progressive diabetic foot infection. He was discharged to [facility] skilled facility. He arrives in clinic today with [name] large wound area over the entirety of his amputation site extending proximally. The attempted flap closure of the amputation site  and his TMA has failed and the wound extends under the attempt to closure. Still some sutures in place. There is an odor but he is not systemically unwell. He is due for follow-up arterial studies and has an appointment with vascular surgery on 10/28/2021. They are using wet-to-dry dressings at [name] Past medical history includes type 2 diabetes with peripheral neuropathy, hypertension, obstructive sleep apnea 4/20; patient presents for follow-up. He is being discharged from his facility at [name] today. They have been doing Dakin's wet-to-dry dressings. He states that his fiance can help with dressing changes. He is getting [facility] home [facility] to come out 3 times [name] week once he leaves the facility. He is scheduled to see vein and vascular on 5/12. He has not seen the wound himself. He puts covers over his face for wound exams. He currently denies systemic signs of infection.  4/28; patient admitted to the clinic 2 weeks ago with [name] dehisced right TMA in the setting of type 2 diabetes with significant PAD. He is status post revascularization. He has been discharged from the nursing home he was at and now is at home using Dakin's wet-to-dry dressings daily he has home health. He denies any systemic signs of infection 5/4; patient presents for follow-up. His fiance and home health have been changing his dressings. He currently denies systemic signs of infection. He continues to put sheet covers over his face during our wound encounters because he does not want [name] look at the wound. 5/25; Patient presents for follow-up. He missed his last clinic appointment. He had [name] bone biopsy and culture done at last clinic visit that showed acute osteomyelitis with growth of E. coli, stenotrophomonas maltophilia and enteroccocus faecalis. [name] referral to infectious disease was made. He has not heard from them yet. He has been using Dakin's wet-to-dry dressings. He is now more comfortable with looking at the wound  bed. 6/6; the patient comes into clinic today with 2 wounds on the left foot which apparently have been there for several months. This was not known to assess but was known by Dr. Donzetta Matters. In fact he underwent an angiogram yesterday. He had [name] laser atherectomy of the left posterior tibial artery with [name] 3 mm balloon angioplasty, also [name] stent of the last left SFA. 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 This is [name] lot better than the last time I saw it. He has been using Dakin's wet-to-dry. He has an appointment with infectious disease for [name] bone biopsy which showed E. coli, stenotrophomonas and Enterococcus faecalis. The appointment is for tomorrow LIUM, KULPA ([id number]) [id number] Page 10 of 15 04/14/2022 Mr. Rozelle Yuill ajuddin is an uncontrolled type 2 diabetic on insulin that presents with [name] left foot ulcer. On 12/18/2021 he required [name] left fifth ray amputation. He had revision of this site on 6/17 by Dr. Posey Pronto, podiatry. He states he has had [name] wound VAC on this area for the past 3 weeks. It is unclear what the prior wound care has been. He has [name] history of osteomyelitis to this area and was treated with IV and oral antibiotics For 6 weeks by Dr. Linus Salmons of infectious disease. At his last follow-up on 02/20/2022 his CRP was trending down and antibiotics were stopped. He also has [name] history of peripheral arterial disease status post stenting to the left SFA and balloon angioplasty to the left posterior tibial artery On 12/2021. He has follow-up with vein and vascular at the end of the month. He also has [name] wound to the dorsal aspect of the left foot that he has been placing Betadine on. 10/19; patient presents for follow-up. He had [name] bone biopsy of the left foot at last clinic visit showed osteomyelitis. The bone  culture showed Enterobacter cloacae, Enterococcus faecalis, Staphylococcus aureus, viridans group strep, actinotigum schaelii. He has been referred to infectious disease And has an appointment on October 25. He has been using Dakin's wet-to-dry dressing to the lateral left foot wound and Hydrofera Blue and Medihoney to the dorsal foot wound. He has no issues or complaints today. He denies systemic signs of infection. 10/26; patient presents for follow-up. He saw Dr. Linus Salmons yesterday with infectious disease and  has been prescribed doxycycline and Levaquin for his chronic osteomyelitis. He has been using Dakin's wet-to-dry dressings to the lateral left foot wound and Medihoney and Hydrofera Blue to the dorsal wound. We discussed [name] skin graft for the dorsal foot wound and he would like to proceed with insurance verification. 11/3; patient with [name] wound on the dorsal left foot and [name] substantial postsurgical area on the lateral left foot fifth ray amputation. He is also followed by Dr. Alton Revere of infectious disease. He had [name] bone biopsy from the left lateral foot that showed acute osteomyelitis. Swab culture showed multiple organisms. He has also been revascularized by infectious disease status post left SFA stenting and tibial laser atherectomy and angioplasty. An MRI of the left foot had previously shown osteomyelitis of the fifth met head we have been using Dakin's wet-to-dry to the left lateral foot and Medihoney and Hydrofera Blue to the wound on the dorsal foot. 11/13; HBO treatment was discussed at last clinic visit and patient would like to proceed with this. He has orientation today. He has been approved for Grafix. We have been using Hydrofera Blue and Medihoney to the left dorsal foot and collagen to the left lateral foot. Patient has home health that comes in for dressing changes. He currently has no issues or complaints today. 12/7; patient missed his last clinic appointment. He was last seen 3  weeks ago. He is currently off of antibiotics per ID. He completed [name] prolonged treatment. He has been using collagen to the lateral foot wound. He has been using Medihoney and Hydrofera Blue to the dorsal foot wound. He has been doing well with hyperbaric oxygen therapy. He has no issues or complaints today. He denies signs of of infection. 12/14; patient presents for follow-up. We have been using Grafix to the wound beds. He has had trouble equalizing the pressure in his ears during HBO treatments. It was recommended he follow-up with ENT He does not have an appointment yet. He currently denies signs of infection. . 12/21; patient presents for follow-up. He was seen by ENT and had tubes placed. He is continued HBO without issues. We have been using Grafix to the wound beds. There is been improvement in wound healing. 12/21; patient presents for follow-up. He has no issues or complaints today. There is been improvement in wound healing. 1/5; patient presents for follow-up. At last clinic visit Grafix was placed in standard fashion. He has no issues or complaints today. He is tolerating hyperbaric oxygen therapy well. He denies signs of infection. 1/11; patient presents for follow-up. We have been placing Grafix to the wound beds on the left foot. The dorsal left foot wound has healed. The left lateral foot wound has shown improvement in healing. Unfortunately has had dehiscence of his previous surgical wound site on the right foot. He currently denies signs of infection. 1/19; patient presents for follow-up. Grafix was placed to the left foot wound at last clinic visit. He has been using Dakin's wet-to-dry dressings to the right foot wound. He continues HBO therapy without issues. He has shown improvement in wound healing to the left foot. 1/26; patient presents for follow-up. We been placing Grafix to the left foot wound and patient has been using Dakin's wet-to-dry dressings to the right  foot wound. There is been improvement in wound healing. He continues HBO therapy without issues. 2/2; patient presents for follow-up. We have been using Grafix to the left foot wound and Dakin's wet-to-dry dressings to the right foot  wound. He has no issues or complaints today. 2/8; patient presents for follow-up. We have been using Grafix to the left foot wound and Dakin's wet-to-dry dressings to the right foot wound. He has no issues or complaints today. He continues HBO without issues. 2/15; patient presents for follow-up. We have been using Grafix to the wound beds. He continues HBO without issues. He has been offloading the wound beds with his surgical shoe. 2/22; patient presents for follow-up. We have been using Grafix to the wound beds. He has no issues or complaints today. He continues HBO therapy without issues. Patient History Information obtained from Patient, Chart. Family History Diabetes - Mother,Siblings, Heart Disease - Siblings, Hypertension - Siblings, Stroke - Mother, No family history of Cancer, Hereditary Spherocytosis, Kidney Disease, Lung Disease, Seizures, Thyroid Problems, Tuberculosis. Social History Former smoker, Marital Status - Single, Alcohol Use - Never, Drug Use - No History, Caffeine Use - Daily. Medical History Eyes Denies history of Cataracts, Glaucoma, Optic Neuritis Respiratory Patient has history of Asthma, Sleep Apnea Denies history of Aspiration, Chronic Obstructive Pulmonary Disease (COPD), Pneumothorax, Tuberculosis Cardiovascular Patient has history of Hypertension, Peripheral Venous Disease Denies history of Angina, Arrhythmia, Congestive Heart Failure, Coronary Artery Disease, Deep Vein Thrombosis, Hypotension, Myocardial Infarction, Peripheral Arterial Disease, Phlebitis, Vasculitis Endocrine Patient has history of Type II Diabetes Denies history of Type I Diabetes Integumentary (Skin) Denies history of History of  Burn Musculoskeletal JORDEN, KNOPE [name] ([id number]) [id number] Page 11 of 15 Patient has history of Osteomyelitis Denies history of Gout, Rheumatoid Arthritis, Osteoarthritis Neurologic Patient has history of Neuropathy Denies history of Dementia, Quadriplegia, Paraplegia, Seizure Disorder Hospitalization/Surgery History - 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 Posey Pronto. - aortogram 12/08/2021 Dr. Virl Cagey [facility]. Medical [name] Surgical History Notes nd Psychiatric PTSD Objective Constitutional respirations regular, non-labored and within target range for patient.. Vitals Time Taken: 10:21 AM, Height: 72 in, Weight: 220 lbs, BMI: 29.8, Temperature: 97.3 F, Pulse: 79 bpm, Respiratory Rate: 18 breaths/min, Blood Pressure: 158/96 mmHg, Capillary Blood Glucose: 83 mg/dl. General Notes: Patient had HBOT prior to this appointment. Given 8 oz Glucerna and drank 1 4 oz Apple Juice thus far. Cardiovascular 2+ dorsalis pedis/posterior tibialis pulses. Psychiatric pleasant and cooperative. General Notes: Left foot: T the lateral aspect there is an open wound with granulation tissue and nonviable tissue. No probing to bone . Right foot: Along the o previous TMA site there is an open wound with nonviable tissue, granulation tissue and callus. There is increased depth In the center. Does not probe to bone. No signs of surrounding soft tissue infection. Integumentary (Hair, Skin) Wound #5 status is Open. Original cause of wound was Surgical Injury. The date acquired was: 12/20/2021. The wound has been in treatment 19 weeks. The wound is located on the Left,Lateral Foot. The wound measures 4cm length x 0.3cm width x 0.2cm depth; 0.942cm^2 area and 0.188cm^3 volume. There is Fat Layer (Subcutaneous Tissue) exposed. There is no tunneling or undermining noted. There is [name] medium amount of serosanguineous drainage noted. The wound margin is epibole. There is large  (67-100%) red, pink granulation within the wound bed. There is [name] small (1-33%) amount of necrotic tissue within the wound bed. The periwound skin appearance had no abnormalities noted for color. The periwound skin appearance exhibited: Callus. The periwound skin appearance did not exhibit: Crepitus, Excoriation, Induration, Rash, Scarring, Dry/Scaly, Maceration. Periwound temperature was noted as No Abnormality. Wound #6 status is Open. Original cause of wound was Gradually Appeared. The date acquired  was: 07/16/2022. The wound has been in treatment 6 weeks. The wound is located on the Right Amputation Site - Transmetatarsal. The wound measures 0.4cm length x 0.3cm width x 0.4cm depth; 0.094cm^2 area and 0.038cm^3 volume. There is Fat Layer (Subcutaneous Tissue) exposed. There is no tunneling or undermining noted. There is [name] medium amount of serosanguineous drainage noted. The wound margin is distinct with the outline attached to the wound base. There is large (67-100%) red granulation within the wound bed. There is no necrotic tissue within the wound bed. The periwound skin appearance exhibited: Callus, Dry/Scaly. The periwound skin appearance did not exhibit: Crepitus, Excoriation, Induration, Rash, Scarring, Maceration, Atrophie Blanche, Cyanosis, Ecchymosis, Hemosiderin Staining, Mottled, Pallor, Rubor, Erythema. Assessment Active Problems ICD-10 Non-pressure chronic ulcer of other part of left foot with fat layer exposed Other acute osteomyelitis, left ankle and foot Non-pressure chronic ulcer of other part of left foot with other specified severity Type 2 diabetes mellitus with foot ulcer Type 2 diabetes mellitus with diabetic polyneuropathy Unspecified open wound, right foot, initial encounter 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 severity Patient's wounds appear well-healing. I debrided nonviable tissue. Grafix placed in standard fashion. I recommended  continuing aggressively offloading the wound beds with his surgical shoes. Continue HBO therapy for his chronic osteomyelitis of the left foot. Follow-up in 1 week. Procedures Wound #5 Pre-procedure diagnosis of Wound #5 is [name] Diabetic Wound/Ulcer of the Lower Extremity located on the Left,Lateral Foot .Severity of Tissue Pre Debridement CAYDAN, DOUCET [name] ([id number]) [id number] Page 12 of 15 is: Fat layer exposed. There was [name] Excisional Skin/Subcutaneous Tissue Debridement with [name] total area of 1.2 sq cm performed by [name], DO. With the following instrument(s): Curette to remove Viable and Non-Viable tissue/material. Material removed includes Callus, Subcutaneous Tissue, Slough, Skin: Dermis, and Skin: Epidermis after achieving pain control using Lidocaine 5% topical ointment. [name] time out was conducted at 11:00, prior to the start of the procedure. [name] Minimum amount of bleeding was controlled with [name]. The procedure was tolerated well with [name] pain level of 0 throughout and [name] pain level of 0 following the procedure. Post Debridement Measurements: 4cm length x 0.3cm width x 0.2cm depth; 0.188cm^3 volume. Character of Wound/Ulcer Post Debridement is improved. Severity of Tissue Post Debridement is: Fat layer exposed. Post procedure Diagnosis Wound #5: Same as Pre-Procedure Pre-procedure diagnosis of Wound #5 is [name] Diabetic Wound/Ulcer of the Lower Extremity located on the Left,Lateral Foot. [name] skin graft procedure using [name] bioengineered skin substitute/cellular or tissue based product was performed by [name], DO with the following instrument(s): Forceps and Scissors. Grafix prime was applied and secured with Steri-Strips and SORBACT 2 sq cm of product was utilized and 2 sq cm was wasted due to wound size. Post . Application, no dressing was applied. [name] Time Out was conducted at 11:10, prior to the start of the procedure. The procedure was tolerated well with [name]  pain level of 0 throughout and [name] pain level of 0 following the procedure. Post procedure Diagnosis Wound #5: Same as Pre-Procedure . 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 There was [name] Excisional Skin/Subcutaneous Tissue Debridement with [name] total area of 1 sq cm performed by [name], DO. With the following instrument(s): Curette to remove Viable and Non-Viable tissue/material. Material removed includes Callus, Subcutaneous Tissue, Slough, Skin:  Dermis, and Skin: Epidermis after achieving pain control using Lidocaine 5% topical ointment. [name] time out was conducted at 11:00, prior to the start of the procedure. [name] Minimum amount of bleeding was controlled with [name]. The procedure was tolerated well with [name] pain level of 0 throughout and [name] pain level of 0 following the procedure. Post Debridement Measurements: 0.4cm length x 0.3cm width x 0.3cm depth; 0.028cm^3 volume. Character of Wound/Ulcer Post Debridement is improved. Severity of Tissue Post Debridement is: Fat layer exposed. 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 [name] skin graft procedure using [name] bioengineered skin substitute/cellular or tissue based product was performed by [name], DO with the following instrument(s): Forceps and Scissors. Grafix prime was applied and secured with Steri-Strips and SORBACT 2 sq cm of product was utilized and 0 sq cm was wasted. Post . Application, no dressing was applied. [name] Time Out was conducted at 11:10, prior to the start of the procedure. The procedure was tolerated well with [name] pain level of 0 throughout and [name] pain level of 0 following the procedure. Post procedure Diagnosis  Wound #6: Same as Pre-Procedure . Plan Follow-up Appointments: Return Appointment in 1 week. - Dr .Heber Iva Thursday after Hyperbaric 1015 overflow 09/03/2022 Return Appointment in 2 weeks. - Dr. Heber [place] Thursday 1100 09/10/2022 room 8 see after hyperbaric. Other: - ****ONLY CHANGE OUTTER DRESSINGS ONLY.*** Anesthetic: (In clinic) Topical Lidocaine 5% applied to wound bed Cellular or Tissue Based Products: Cellular or Tissue Based Product Type: - Run IVR for Grafix for left dorsal foot- pending Grafix # 1 applied 06/11/22 Grafix # 2 applied 06/18/22 Grafix # 3 applied 06/25/22 GRafix # 4 applied 07/02/22 Grafix # 5 applied 07/10/22 Grafix # 6 applied 07/17/22 Grafix # 7 applied 07/24/22---Also, go ahead and run IVR for more Grafix for left lateral foot Grafix #8 applied 07/31/2022 Grafix # 9 applied 08/07/22] left foot Grafix #10 applied 08/13/2022 left and right foot wounds today split the grafix in half. Graftx #11 applied to both wounds. Patient right foot approved for total of 12 between dates 08/18/2022-11/04/2022. Grafix #12 applied to both wounds. Cellular or Tissue Based Product applied to wound bed, secured with steri-strips, cover with Adaptic or Mepitel. (DO NOT REMOVE). Edema Control - Lymphedema / SCD / Other: Avoid standing for long periods of time. Off-Loading: Open toe surgical shoe to: - wear when standing or walking. Hyperbaric Oxygen Therapy: Evaluate for HBO Therapy - 07/24/22- Pt. to finish next 8 HBO treatments 07/24/22- DR. Yorley Buch wants pt. 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 Follow Hyperbaric Oxygen Glycemia Protocol Afrin  (Oxymetazoline HCL) 0.05% nasal spray - 1 spray in both nostrils daily as needed prior to HBO treatment for difficulty clearing ears WOUND #5: - Foot Wound Laterality: Left, Lateral Cleanser: Wound Cleanser (Generic) 1 x Per Day/30 Days Discharge Instructions: Cleanse the wound with wound cleanser prior to applying [name] clean dressing using gauze sponges, not tissue or cotton balls. Peri-Wound Care: Skin Prep (Generic) 1 x Per Day/30 Days Discharge Instructions: Use skin prep as  directed Prim Dressing: Grafix #12 (Generic) 1 x Per Day/30 Days ary Discharge Instructions: applied by provider. Prim Dressing: adaptic and steri-strips 1 x Per Day/30 Days ary Discharge Instructions: to secure grafix to wound bed. leave in place. Secondary Dressing: ABD Pad, 8x10 (Generic) 1 x Per Day/30 Days Discharge Instructions: Apply over primary dressing as directed. Secured With: [employer], 4.5x3.1 (in/yd) (Generic) 1 x Per Day/30 Days Discharge Instructions: Secure with Kerlix as directed. Secured With: 42M Medipore H Soft Cloth Surgical T ape, 4 x 10 (in/yd) (Generic) 1 x Per Day/30 Days Discharge Instructions: Secure with tape as directed. WOUND #6: - Amputation Site - Transmetatarsal Wound Laterality: Right Cleanser: Wound Cleanser (Generic) 1 x Per Day/30 Days Discharge Instructions: Cleanse the wound with wound cleanser prior to applying [name] clean dressing using gauze sponges, not tissue or cotton balls. Peri-Wound Care: Skin Prep (Generic) 1 x Per Day/30 Days Discharge Instructions: Use skin prep as directed Prim Dressing: Grafix #3 (Generic) 1 x Per Day/30 Days ary Discharge Instructions: applied by provider. Prim Dressing: adaptic and steri-strips 1 x Per Day/30 Days ary OTTIE, LINDENBAUM [name] ([id number]) [id number] Page 13 of 15 Discharge Instructions: to secure grafix to wound bed. leave in place. Prim Dressing: cutimed sorbact 1 x Per Day/30 Days ary Discharge  Instructions: lightly pack into wound bed behind the grafix. Secondary Dressing: ABD Pad, 8x10 (Generic) 1 x Per Day/30 Days Discharge Instructions: Apply over primary dressing as directed. Secured With: [employer], 4.5x3.1 (in/yd) (Generic) 1 x Per Day/30 Days Discharge Instructions: Secure with Kerlix as directed. Secured With: 42M Medipore H Soft Cloth Surgical T ape, 4 x 10 (in/yd) (Generic) 1 x Per Day/30 Days Discharge Instructions: Secure with tape as directed. 1. In office sharp debridement 2. Grafix placed in standard fashion 3. Aggressive offloadingoosurgical shoe 4. Follow-up in 1 week Electronic Signature(s) Signed: 08/27/2022 11:46:05 AM By: [name] DO Entered By: [name] on 08/27/2022 11:17:58 -------------------------------------------------------------------------------- HxROS Details Patient Name: Date of Service: [name], [name] 08/27/2022 10:15 [name] Medical Record Number: [id number] Patient Account Number: [id number] Date of Birth/Sex: Treating RN: 05-21-1956 (67 y.o. M) Primary Care Provider: [name] Other Clinician: Referring Provider: Treating Provider/Extender: [name] in Treatment: 19 Information Obtained From Patient Chart Eyes Medical History: Negative for: Cataracts; Glaucoma; Optic Neuritis Respiratory Medical History: Positive for: Asthma; Sleep Apnea Negative for: Aspiration; Chronic Obstructive Pulmonary Disease (COPD); Pneumothorax; Tuberculosis Cardiovascular Medical History: Positive for: Hypertension; Peripheral Venous Disease Negative for: Angina; Arrhythmia; Congestive Heart Failure; Coronary Artery Disease; Deep Vein Thrombosis; Hypotension; Myocardial Infarction; Peripheral Arterial Disease; Phlebitis; Vasculitis Endocrine Medical History: Positive for: Type II Diabetes Negative for: Type I Diabetes Time with diabetes: 20 years Treated with: Insulin, Oral agents Blood  sugar tested every day: Yes Tested : 4x day Integumentary (Skin) Medical History: Negative for: History of Burn Musculoskeletal Medical HistoryARTYOM, BOOMER ([id number]) [phone].pdf Page 14 of 15 Positive for: Osteomyelitis Negative for: Gout; Rheumatoid Arthritis; Osteoarthritis Neurologic Medical History: Positive for: Neuropathy Negative for: Dementia; Quadriplegia; Paraplegia; Seizure Disorder Psychiatric Medical History: Past Medical History Notes: PTSD Immunizations Pneumococcal Vaccine: Received Pneumococcal Vaccination: No Implantable Devices None Hospitalization / Surgery History Type of Hospitalization/Surgery 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 Posey Pronto aortogram 12/08/2021 Dr. Virl Cagey [facility] Family and Social History Cancer: No; Diabetes: Yes - Mother,Siblings; Heart Disease: Yes - Siblings; Hereditary Spherocytosis: No; Hypertension: Yes - Siblings; Kidney Disease: No; Lung Disease: No; Seizures: No; Stroke: Yes -  Mother; Thyroid Problems: No; Tuberculosis: No; Former smoker; Marital Status - Single; Alcohol Use: Never; Drug Use: No History; Caffeine Use: Daily; Financial Concerns: No; Food, Clothing or Shelter Needs: No; Support System Lacking: No; Transportation Concerns: No Electronic Signature(s) Signed: 08/27/2022 11:46:05 AM By: [name] DO Entered By: [name] on 08/27/2022 11:15:37 -------------------------------------------------------------------------------- SuperBill Details Patient Name: Date of Service: [name], [name] 08/27/2022 Medical Record Number: [id number] Patient Account Number: [id number] Date of Birth/Sex: Treating RN: [birth year] (67 y.o. [name], [name] Primary Care Provider: [name] Other Clinician: Referring Provider: Treating Provider/Extender: [name] in Treatment: 19 Diagnosis Coding ICD-10 Codes Code Description [phone]  Non-pressure chronic ulcer of other part of left foot with fat layer exposed M86.172 Other acute osteomyelitis, left ankle and foot L97.528 Non-pressure chronic ulcer of other part of left foot with other specified severity E11.621 Type 2 diabetes mellitus with foot ulcer E11.42 Type 2 diabetes mellitus with diabetic polyneuropathy S91.301A Unspecified open wound, right foot, initial encounter 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 severity Facility Procedures : CPT4 Code: [id number] Description: Q4133- Grafix PL 2X3 sq cm (6 units) Modifier: Quantity: 6 : EASTON, PFUHL Code: [id number] AAZIR [name] ([id number]) ICD-10 L97 L97 Description: [id number] - SKIN SUB GRAFT FACE/NK/HF/G [phone] Diagnosis Description .522 Non-pressure chronic ulcer of other part of left foot with fat layer exposed .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  severity Modifier: [id number] Quantity: 1 df Page 15 of 15 Physician Procedures : CPT4 Code Description Modifier [id number] - WC PHYS SKIN SUB GRAFT FACE/NK/HF/G ICD-10 Diagnosis Description 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.518 Non-pressure chronic ulcer of other part of  right foot with other specified severity Quantity: 1 Electronic Signature(s) Signed: 08/27/2022 11:46:05 AM By: [name] DO Entered By: [name] on 08/27/2022 11:18:43

## 2022-08-29 NOTE — Progress Notes (Signed)
JAYDAN, LOTITO A ([id number]) [id number] Page 1 of 1 Visit Report for 08/28/2022 SuperBill Details Patient Name: Date of Service: [name] 08/28/2022 Medical Record Number: [id number] Patient Account Number: [id number] Date of Birth/Sex: Treating RN: 1955/07/14 (67 y.o. [name] Primary Care Provider: [name] Other Clinician: [name] Referring Provider: Treating Provider/Extender: [name] in Treatment: 19 Diagnosis Coding ICD-10 Codes Code Description [phone] Non-pressure chronic ulcer of other part of left foot with fat layer exposed M86.172 Other acute osteomyelitis, left ankle and foot L97.528 Non-pressure chronic ulcer of other part of left foot with other specified severity E11.621 Type 2 diabetes mellitus with foot ulcer E11.42 Type 2 diabetes mellitus with diabetic polyneuropathy S91.301A Unspecified open wound, right foot, initial encounter 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 severity Facility Procedures CPT4 Code Description Modifier Quantity [id number]-(Facility Use Only) HBOT full body chamber, 50mn , 4 ICD-10 Diagnosis Description E11.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 M86.172 Other acute osteomyelitis, left ankle and foot Physician Procedures Quantity CPT4 Code Description Modifier [id number] - WC PHYS HYPERBARIC OXYGEN THERAPY 1 ICD-10 Diagnosis Description E11.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 M86.172 Other acute osteomyelitis, left ankle and  foot Electronic Signature(s) Signed: 08/28/2022 12:03:56 PM By: SDonavan BurnetCHT EMT BS , , Signed: 08/28/2022 12:04:24 PM By: HKalman ShanDO Entered By: SDonavan Burneton 08/28/2022 12:03:54

## 2022-08-29 NOTE — Progress Notes (Signed)
[name], [name] ([id number]) [id number] Page 1 of 8 Visit Report for 08/27/2022 Arrival Information Details Patient Name: Date of Service: [name], [name] 08/27/2022 10:15 [name] Medical Record Number: [id number] Patient Account Number: [id number] Date of Birth/Sex: Treating RN: 11-03-1955 (67 y.o. M) Primary Care [name]: [name] Other Clinician: Referring [name]: Treating [name]/Extender: [name] in Treatment: 62 Visit Information History Since Last Visit All ordered tests and consults were completed: Yes Patient Arrived: Wheel Chair Added or deleted any medications: No Arrival Time: 10:28 Any new allergies or adverse reactions: No Accompanied By: self Had [name] fall or experienced change in No Transfer Assistance: None activities of daily living that may affect Patient Identification Verified: Yes risk of falls: Secondary Verification Process Completed: Yes Signs or symptoms of abuse/neglect since last visito No Patient Requires Transmission-Based Precautions: No Hospitalized since last visit: No Patient Has Alerts: Yes Implantable device outside of the clinic excluding No cellular tissue based products placed in the center since last visit: Pain Present Now: No Electronic Signature(s) Signed: 08/28/2022 2:41:47 PM By: [name] CHT EMT BS , , Entered By: [name] on 08/27/2022 10:28:55 -------------------------------------------------------------------------------- Encounter Discharge Information Details Patient Name: Date of Service: [name], [name] 08/27/2022 10:15 [name] Medical Record Number: [id number] Patient Account Number: [id number] Date of Birth/Sex: Treating RN: 11/15/1955 (67 y.o. [name] Primary Care Keanthony Poole: [name] Other Clinician: Referring [name]: Treating [name]/Extender: [name] in Treatment: 19 Encounter Discharge  Information Items Post Procedure Vitals Discharge Condition: Stable Temperature (F): 97.3 Ambulatory Status: Wheelchair Pulse (bpm): 79 Discharge Destination: Home Respiratory Rate (breaths/min): 18 Transportation: Private Auto Blood Pressure (mmHg): 158/96 Accompanied By: self Schedule Follow-up Appointment: Yes Clinical Summary of Care: Electronic Signature(s) Signed: 08/28/2022 12:41:16 PM By: [name] Pilling RN, [name] Entered By: [name] on 08/27/2022 11:16:05 -------------------------------------------------------------------------------- Lower Extremity Assessment Details Patient Name: Date of Service: [name], [name] 08/27/2022 10:15 [name] Medical Record Number: [id number] Patient Account Number: [id number] Date of Birth/Sex: Treating RN: [birth year] (67 y.o. M) Primary Care [name]: [name] Other Clinician: Referring [name]: Treating [name]/Extender: [name], [name] in Treatment: 19 Edema Assessment Assessed: [Left: No] [name]: No] T[LeftGOR, THRAPP [id number] [[id number] Page 2 of 8] Edema: [Left: Yes] [Right: Yes] Calf Left: Right: Point of Measurement: 36 cm From Medial Instep 37 cm 37.6 cm Ankle Left: Right: Point of Measurement: 9 cm From Medial Instep 22.5 cm 22 cm Electronic Signature(s) Signed: 08/28/2022 12:09:33 PM By: [name] Entered By: [name] on 08/27/2022 10:41:43 -------------------------------------------------------------------------------- Multi Wound Chart Details Patient Name: Date of Service: [name], [name] 08/27/2022 10:15 [name] Medical Record Number: [id number] Patient Account Number: [id number] Date of Birth/Sex: Treating RN: March 20, 1956 (67 y.o. M) Primary Care [name]: [name] Other Clinician: Referring Torra Pala: Treating Dorn Hartshorne/Extender: [name], [name] in Treatment: 19 Vital Signs Height(in): 72 Capillary  Blood Glucose(mg/dl): 39 Weight(lbs): 220 Pulse(bpm): 61 Body Mass Index(BMI): 29.8 Blood Pressure(mmHg): 158/96 Temperature(F): 97.3 Respiratory Rate(breaths/min): 18 [5:Photos:] [[name]:[name]] Left, Lateral Foot Right Amputation Site - [name] Wound Location: Transmetatarsal Surgical Injury Gradually Appeared [name] Wounding Event: Diabetic Wound/Ulcer of the Lower Diabetic Wound/Ulcer of the Lower [name] Primary Etiology: Extremity Extremity Open Surgical Wound [name] [name] Secondary Etiology: Asthma, Sleep Apnea, Hypertension, Asthma, Sleep Apnea, Hypertension, [name] Comorbid History: Peripheral Venous Disease, Type II Peripheral Venous Disease, Type II Diabetes, Osteomyelitis, Neuropathy Diabetes, Osteomyelitis, Neuropathy 12/20/2021 07/16/2022 [name]  Date Acquired: 15 6 [name]: Open Open [name] Wound Status: No No [name] Wound Recurrence: 4x0.3x0.2 0.4x0.3x0.4 [name] Measurements L x W x D (cm) 0.942 0.094 [name] (cm) : rea 0.188 0.038 [name] Volume (cm) : 90.60% 90.40% [name] % Reduction in [name] rea: 96.20% 96.10% [name] % Reduction in Volume: Grade 3 Grade 2 [name] Classification: Medium Medium [name] Exudate [name] mount: Serosanguineous Serosanguineous [name] Exudate Type: red, brown red, brown [name] Exudate Color: Epibole Distinct, outline attached [name] Wound Margin: Large (67-100%) Large (67-100%) [name] Granulation [name] mount: Red, Pink Red [name] Granulation Quality: Small (1-33%) None Present (0%) [name] Necrotic [name] mount: Fat Layer (Subcutaneous Tissue): Yes Fat Layer (Subcutaneous Tissue): Yes [name] Exposed Structures: Fascia: No Fascia: No Tendon: No Tendon: No Muscle: No Muscle: No Joint: No Joint: No Bone: No Bone: No Large (67-100%) None [name] EpithelializationLAVELL, [name] ([id number].pdf Page 3 of 8 Debridement - Excisional Debridement - Excisional [name] Debridement: 11:00 11:00 [name] Pre-procedure Verification/Time Out Taken: Lidocaine 5%  topical ointment Lidocaine 5% topical ointment [name] Pain Control: Callus, Subcutaneous, Slough Callus, Subcutaneous, Slough [name] Tissue Debrided: Skin/Subcutaneous Tissue Skin/Subcutaneous Tissue [name] Level: 1.2 1 [name] Debridement [name] (sq cm): rea Curette Curette [name] Instrument: Minimum Minimum [name] Bleeding: 0 0 [name] Procedural Pain: 0 0 [name] Post Procedural Pain: Procedure was tolerated well Procedure was tolerated well [name] Debridement Treatment Response: 4x0.3x0.2 0.4x0.3x0.3 [name] Post Debridement Measurements L x W x D (cm) 0.188 0.028 [name] Post Debridement Volume: (cm) Callus: Yes Callus: Yes [name] Periwound Skin Texture: Excoriation: No Excoriation: No Induration: No Induration: No Crepitus: No Crepitus: No Rash: No Rash: No Scarring: No Scarring: No Maceration: No Dry/Scaly: Yes [name] Periwound Skin Moisture: Dry/Scaly: No Maceration: No Atrophie Blanche: No Atrophie Blanche: No [name] Periwound Skin Color: Cyanosis: No Cyanosis: No Ecchymosis: No Ecchymosis: No Erythema: No Erythema: No Hemosiderin Staining: No Hemosiderin Staining: No Mottled: No Mottled: No Pallor: No Pallor: No Rubor: No Rubor: No No Abnormality [name] [name] Temperature: Cellular or Tissue Based Product Cellular or Tissue Based Product [name] Procedures Performed: Debridement Debridement Treatment Notes Electronic Signature(s) Signed: 08/27/2022 11:46:05 AM By: [name] DO Entered By: [name] on 08/27/2022 11:14:07 -------------------------------------------------------------------------------- Multi-Disciplinary Care Plan Details Patient Name: Date of Service: [name], [name] 08/27/2022 10:15 [name] Medical Record Number: [id number] Patient Account Number: [id number] Date of Birth/Sex: Treating RN: Jun 15, 1956 (67 y.o. [name] Primary Care Dazani Norby: [name] Other Clinician: Referring Haider Hornaday: Treating Kabe Mckoy/Extender: [name],  [name] in Treatment: 19 Active Inactive Osteomyelitis Nursing Diagnoses: Infection: osteomyelitis Goals: Diagnostic evaluation for osteomyelitis completed as ordered Date Initiated: 04/23/2022 Target Resolution Date: 09/04/2022 Goal Status: Active Signs and symptoms for osteomyelitis will be recognized and promptly addressed Date Initiated: 04/23/2022 Target Resolution Date: 09/04/2022 Goal Status: Active Interventions: Assess for signs and symptoms of osteomyelitis resolution every visit Provide education on osteomyelitis Screen for HBO Treatment Activities: Biopsy : 04/16/2022 Consult for HBO : 04/23/2022 Surgical debridement : 04/23/2022 [name] ([id number]) [phone].pdf Page 4 of 8 Systemic antibiotics : 04/23/2022 T ordered outside of clinic : 04/23/2022 est Notes: Wound/Skin Impairment Nursing Diagnoses: Knowledge deficit related to ulceration/compromised skin integrity Goals: Patient/caregiver will verbalize understanding of skin care regimen Date Initiated: 04/14/2022 Target Resolution Date: 09/04/2022 Goal Status: Active Interventions: Assess patient/caregiver ability to perform ulcer/skin care regimen upon admission and as needed Assess ulceration(s) every visit Provide education on ulcer and skin care Notes: Electronic Signature(s) Signed: 08/28/2022 12:41:16 PM By: [name] Pilling  RN, [name] Entered By: [name] on 08/27/2022 11:02:05 -------------------------------------------------------------------------------- Pain Assessment Details Patient Name: Date of Service: [name], [name] 08/27/2022 10:15 [name] Medical Record Number: [id number] Patient Account Number: [id number] Date of Birth/Sex: Treating RN: [birth year] (67 y.o. M) Primary Care Dwight Adamczak: [name] Other Clinician: Referring Meryl Hubers: Treating Shelley Pooley/Extender: [name], [name] in Treatment: 19 Active Problems Location of Pain  Severity and Description of Pain Patient Has Paino No Site Locations Pain Management and Medication Current Pain Management: Electronic Signature(s) Signed: 08/28/2022 12:09:33 PM By: [name] Entered By: [name] on 08/27/2022 10:41:18 Patient/Caregiver Education Details -------------------------------------------------------------------------------- [name] ([id number]) [id number] Page 5 of 8 Patient Name: Date of Service: [name], [name] 2/22/2024andnbsp10:15 [name] Medical Record Number: [id number] Patient Account Number: [id number] Date of Birth/Gender: Treating RN: 1956-06-16 (67 y.o. [name] Primary Care Physician: [name] Other Clinician: Referring Physician: Treating Physician/Extender: [name] in Treatment: 19 Education Assessment Education Provided To: Patient Education Topics Provided Wound/Skin Impairment: Handouts: Caring for Your Ulcer Methods: Explain/Verbal Responses: Reinforcements needed Electronic Signature(s) Signed: 08/28/2022 12:41:16 PM By: [name] Pilling RN, [name] Entered By: [name] on 08/27/2022 11:02:20 -------------------------------------------------------------------------------- Wound Assessment Details Patient Name: Date of Service: [name], [name] 08/27/2022 10:15 [name] Medical Record Number: [id number] Patient Account Number: [id number] Date of Birth/Sex: Treating RN: [birth year] (67 y.o. M) Primary Care Dalyce Renne: [name] Other Clinician: Referring Leslieanne Cobarrubias: Treating Maxson Oddo/Extender: [name], [name] in Treatment: 19 Wound Status Wound Number: 5 Primary Diabetic Wound/Ulcer of the Lower Extremity Etiology: Wound Location: Left, Lateral Foot Secondary Open Surgical Wound Wounding Event: Surgical Injury Etiology: Date Acquired: 12/20/2021 Wound Open [name]: 19 Status: Clustered Wound: No Comorbid  Asthma, Sleep Apnea, Hypertension, Peripheral Venous Disease, History: Type II Diabetes, Osteomyelitis, Neuropathy Photos Wound Measurements Length: (cm) 4 Width: (cm) 0.3 Depth: (cm) 0.2 Area: (cm) 0.942 Volume: (cm) 0.188 % Reduction in Area: 90.6% % Reduction in Volume: 96.2% Epithelialization: Large (67-100%) Tunneling: No Undermining: No Wound Description Classification: Grade 3 Wound Margin: Epibole Exudate Amount: Medium Exudate Type: Serosanguineous Exudate Color: red, brown [name], [name] ([id number]) Foul Odor After Cleansing: No Slough/Fibrino Yes [phone].pdf Page 6 of 8 Wound Bed Granulation Amount: Large (67-100%) Exposed Structure Granulation Quality: Red, Pink Fascia Exposed: No Necrotic Amount: Small (1-33%) Fat Layer (Subcutaneous Tissue) Exposed: Yes Tendon Exposed: No Muscle Exposed: No Joint Exposed: No Bone Exposed: No Periwound Skin Texture Texture Color No Abnormalities Noted: No No Abnormalities Noted: Yes Callus: Yes Temperature / Pain Crepitus: No Temperature: No Abnormality Excoriation: No Induration: No Rash: No Scarring: No Moisture No Abnormalities Noted: No Dry / Scaly: No Maceration: No Treatment Notes Wound #5 (Foot) Wound Laterality: Left, Lateral Cleanser Wound Cleanser Discharge Instruction: Cleanse the wound with wound cleanser prior to applying [name] clean dressing using gauze sponges, not tissue or cotton balls. Peri-Wound Care Skin Prep Discharge Instruction: Use skin prep as directed Topical Primary Dressing Grafix #12 Discharge Instruction: applied by Piers Baade. adaptic and steri-strips Discharge Instruction: to secure grafix to wound bed. leave in place. Secondary Dressing ABD Pad, 8x10 Discharge Instruction: Apply over primary dressing as directed. Secured With [employer], 4.5x3.1 (in/yd) Discharge Instruction: Secure with Kerlix as directed. 65M Medipore H Soft Cloth  Surgical T ape, 4 x 10 (in/yd) Discharge Instruction: Secure with tape as directed. Compression Wrap Compression Stockings Add-Ons Electronic Signature(s) Signed: 08/28/2022 12:09:33 PM By: [name] Entered By: [name]  on 08/27/2022 10:45:29 -------------------------------------------------------------------------------- Wound Assessment Details Patient Name: Date of Service: [name], [name] 08/27/2022 10:15 [name] Medical Record Number: [id number] Patient Account Number: [id number] Date of Birth/Sex: Treating RN: 1956/04/03 (67 y.o. 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 Wound Open Wounding Event: Gradually Appeared Status: Date Acquired: 07/16/2022 Comorbid Asthma, Sleep Apnea, Hypertension, Peripheral Venous Disease, [name]: 6 History: Type II Diabetes, Osteomyelitis, Neuropathy Clustered Wound: No Photos Wound Measurements Length: (cm) 0.4 Width: (cm) 0.3 Depth: (cm) 0.4 Area: (cm) 0.094 Volume: (cm) 0.038 % Reduction in Area: 90.4% % Reduction in Volume: 96.1% Epithelialization: None Tunneling: No Undermining: No Wound Description Classification: Grade 2 Wound Margin: Distinct, outline attached Exudate Amount: Medium Exudate Type: Serosanguineous Exudate Color: red, brown Foul Odor After Cleansing: No Slough/Fibrino Yes Wound Bed Granulation Amount: Large (67-100%) Exposed Structure Granulation Quality: Red Fascia Exposed: No Necrotic Amount: None Present (0%) Fat Layer (Subcutaneous Tissue) Exposed: Yes Tendon Exposed: No Muscle Exposed: No Joint Exposed: No Bone Exposed:  No Periwound Skin Texture Texture Color No Abnormalities Noted: No No Abnormalities Noted: No Callus: Yes Atrophie Blanche: No Crepitus: No Cyanosis: No Excoriation: No Ecchymosis: No Induration: No Erythema: No Rash: No Hemosiderin Staining: No Scarring: No Mottled: No Pallor: No Moisture Rubor: No No Abnormalities Noted: No Dry / Scaly: Yes Maceration: No Treatment Notes Wound #6 (Amputation Site - Transmetatarsal) Wound Laterality: Right Cleanser Wound Cleanser Discharge Instruction: Cleanse the wound with wound cleanser prior to applying [name] clean dressing using gauze sponges, not tissue or cotton balls. Peri-Wound Care Skin Prep Discharge Instruction: Use skin prep as directed Topical [name], [name] ([id number]) [phone].pdf Page 8 of 8 Primary Dressing Grafix #3 Discharge Instruction: applied by Alanis Clift. adaptic and steri-strips Discharge Instruction: to secure grafix to wound bed. leave in place. cutimed sorbact Discharge Instruction: lightly pack into wound bed behind the grafix. Secondary Dressing ABD Pad, 8x10 Discharge Instruction: Apply over primary dressing as directed. Secured With [employer], 4.5x3.1 (in/yd) Discharge Instruction: Secure with Kerlix as directed. 45M Medipore H Soft Cloth Surgical T ape, 4 x 10 (in/yd) Discharge Instruction: Secure with tape as directed. Compression Wrap Compression Stockings Add-Ons Electronic Signature(s) Signed: 08/28/2022 12:09:33 PM By: [name] Entered By: [name] on 08/27/2022 10:46:35 -------------------------------------------------------------------------------- Vitals Details Patient Name: Date of Service: [name], [name] 08/27/2022 10:15 [name] Medical Record Number: [id number] Patient Account Number: [id number] Date of Birth/Sex: Treating RN: 02/25/1956 (67 y.o. M) Primary Care Donnielle Addison: [name] Other Clinician: Referring Zachry Hopfensperger: Treating  Alea Ryer/Extender: [name], [name] in Treatment: 19 Vital Signs Time Taken: 10:21 Temperature (F): 97.3 Height (in): 72 Pulse (bpm): 79 Weight (lbs): 220 Respiratory Rate (breaths/min): 18 Body Mass Index (BMI): 29.8 Blood Pressure (mmHg): 158/96 Capillary Blood Glucose (mg/dl): 83 Reference Range: 80 - 120 mg / dl Notes Patient had HBOT prior to this appointment. Given 8 oz Glucerna and drank 1 4 oz Apple Juice thus far. Electronic Signature(s) Signed: 08/28/2022 2:41:47 PM By: [name] CHT EMT BS , , Entered By: [name] on 08/27/2022 10:30:55

## 2022-08-29 NOTE — Progress Notes (Addendum)
[name], [name] [name] ([id number]) [id number] Page 1 of 2 Visit Report for 08/28/2022 HBO Details Patient Name: Date of Service: [name], [name]. 08/28/2022 8:00 [name] Medical Record Number: [id number] Patient Account Number: [id number] Date of Birth/Sex: Treating RN: [birth year] (67 y.o. [name] Primary Care Alrick Cubbage: [name] Other Clinician: Valeria Batman Referring Roise Emert: Treating Ramya Vanbergen/Extender: [name] in Treatment: 19 HBO Treatment Course Details Treatment Course Number: 1 Ordering Cord Wilczynski: [name] T Treatments Ordered: otal 80 HBO Treatment Start Date: 06/01/2022 HBO Indication: Diabetic Ulcer(s) of the Lower Extremity Standard/Conservative Wound Care tried and failed greater than or equal to 30 days HBO Treatment Details Treatment Number: 54 Patient Type: Outpatient Chamber Type: Monoplace Chamber Serial #: [id number] Treatment Protocol: 2.5 ATA with 90 minutes oxygen, with two 5 minute air breaks Treatment Details Compression Rate Down: 1.5 psi / minute De-Compression Rate Up: 2.0 psi / minute [name] breaks and breathing ir Compress Tx Pressure periods Decompress Decompress Begins Reached (leave unused spaces Begins Ends blank) Chamber Pressure (ATA 1 2.5 2.5 2.5 2.5 2.5 - - 2.5 1 ) Clock Time (24 hr) 08:06 08:22 08:52 08:57 09:27 09:32 - - 10:02 10:11 Treatment Length: 125 (minutes) Treatment Segments: 4 Vital Signs Capillary Blood Glucose Reference Range: 80 - 120 mg / dl HBO Diabetic Blood Glucose Intervention Range: <131 mg/dl or >249 mg/dl Time Capillary Blood Glucose Pulse Blood Respiratory Action Type: Vitals Pulse: Temperature: Glucose Oximetry Pressure: Rate: Meter #: Taken: Taken: (mg/dl): (%) Pre 07:29 139/87 79 18 98.3 143 1 none per protocol Post 10:19 139/84 72 18 97.3 85 Patient given 8 oz Glucerna Shake, per protocol. Given 8 oz Glucerna and 2x 4oz Juice per Dr. Ginette Pitman  07:52 141 1 Hoffman Treatment Response Treatment Toleration: Well Treatment Completion Status: Treatment Completed without Adverse Event Treatment Notes Patient arrived for treatment, blood glucose measured 143 mg/dL. Patient stated that he ate [place], egg, cheese on english muffin, orange juice. [name] prepared for treatment. [place], blood glucose was measured again 20 minutes later with [name] result of 141 mg/dL. For safety patient was given 8 oz Glucerna and 2 x 4 oz Orange Juice per standing order from Dr. Heber . Blood glucose was not re-measured because patient was above protocol- recommended blood glucose level already. After safety check patient was placed in the chamber which was compressed with 100% oxygen at [name] rate of 1 psi/min until confirming normal ear equalization at which time rate set was increased to 2 psi/min for remaining travel to treatment depth of 2.5 ATA. Patient tolerated treatment and decompression of the chamber at [name] rate of 2 psi/min. Post-treatment vital signs were normal with the exception of blood glucose which was 85 mg/dL. Patient was given 8 oz Glucerna. Patient was asymptomatic for hypoglycemia and was stable upon discharge from HBO. [facility]) Additional Procedure Documentation Tissue Sevierity: Necrosis of bone Physician HBO Attestation: I certify that I supervised this HBO treatment in accordance with Medicare guidelines. [name] trained emergency response team is readily available per Yes hospital policies and procedures. Continue HBOT as ordered. Yes Electronic Signature(s) Signed: 09/01/2022 3:46:53 PM By: [name] DO Previous Signature: 08/28/2022 12:02:26 PM Version By: [name] CHT EMT BS , , Previous Signature: 08/28/2022 12:04:24 PM Version By: Elenor Legato ([id number] Page 2 of 2 Previous Signature: 08/28/2022 12:04:24 PM Version By: [name] DO Previous  Signature: 08/28/2022 10:47:20 AM Version By: [name] CHT EMT BS , , Previous  Signature: 08/28/2022 10:45:49 AM Version By: [name] CHT EMT BS , , Entered By: [name] on 09/01/2022 13:26:29 -------------------------------------------------------------------------------- HBO Safety Checklist Details Patient Name: Date of Service: [name], [name]. 08/28/2022 8:00 [name] Medical Record Number: [id number] Patient Account Number: [id number] Date of Birth/Sex: Treating RN: [birth year] (67 y.o. [name] Primary Care Tocarra Gassen: [name] Other Clinician: [name] Referring Malyna Budney: Treating Jerrald Doverspike/Extender: [name] in Treatment: 19 HBO Safety Checklist Items Safety Checklist Consent Form Signed Patient voided / foley secured and emptied When did you last eato 0700 Last dose of injectable or oral agent 0645 Ostomy pouch emptied and vented if applicable NA All implantable devices assessed, documented and approved NA Intravenous access site secured and place NA Valuables secured Linens and cotton and cotton/polyester blend (less than 51% polyester) Personal oil-based products / skin lotions / body lotions removed Wigs or hairpieces removed NA Smoking or tobacco materials removed NA Books / newspapers / magazines / loose paper removed Cologne, aftershave, perfume and deodorant removed Jewelry removed (may wrap wedding band) Make-up removed NA Hair care products removed Battery operated devices (external) removed Heating patches and chemical warmers removed Titanium eyewear removed Nail polish cured greater than 10 hours NA Casting material cured greater than 10 hours NA Hearing aids removed NA Loose dentures or partials removed dentures removed Prosthetics have been removed NA Patient demonstrates correct use of air break device (if applicable) Patient concerns have been addressed Patient  grounding bracelet on and cord attached to chamber Specifics for Inpatients (complete in addition to above) Medication sheet sent with patient NA Intravenous medications needed or due during therapy sent with patient NA Drainage tubes (e.g. nasogastric tube or chest tube secured and vented) NA Endotracheal or Tracheotomy tube secured NA Cuff deflated of air and inflated with saline NA Airway suctioned NA Notes Paper version used prior to treatment start. Electronic Signature(s) Signed: 08/28/2022 10:37:34 AM By: [name] CHT EMT BS , , Entered By: [name] on 08/28/2022 10:37:34

## 2022-08-31 ENCOUNTER — Encounter (HOSPITAL_BASED_OUTPATIENT_CLINIC_OR_DEPARTMENT_OTHER): Payer: Medicare HMO | Admitting: General Surgery

## 2022-08-31 DIAGNOSIS — E11621 Type 2 diabetes mellitus with foot ulcer: Secondary | ICD-10-CM | POA: Diagnosis not present

## 2022-08-31 LAB — GLUCOSE, CAPILLARY
Glucose-Capillary: 196 mg/dL — ABNORMAL HIGH (ref 70–99)
Glucose-Capillary: 107 mg/dL — ABNORMAL HIGH (ref 70–99)

## 2022-09-01 ENCOUNTER — Encounter (HOSPITAL_BASED_OUTPATIENT_CLINIC_OR_DEPARTMENT_OTHER): Payer: Medicare HMO | Admitting: Internal Medicine

## 2022-09-01 DIAGNOSIS — L97528 Non-pressure chronic ulcer of other part of left foot with other specified severity: Secondary | ICD-10-CM

## 2022-09-01 DIAGNOSIS — L97522 Non-pressure chronic ulcer of other part of left foot with fat layer exposed: Secondary | ICD-10-CM | POA: Diagnosis not present

## 2022-09-01 DIAGNOSIS — M86172 Other acute osteomyelitis, left ankle and foot: Secondary | ICD-10-CM

## 2022-09-01 DIAGNOSIS — E11621 Type 2 diabetes mellitus with foot ulcer: Secondary | ICD-10-CM

## 2022-09-01 LAB — GLUCOSE, CAPILLARY
Glucose-Capillary: 228 mg/dL — ABNORMAL HIGH (ref 70–99)
Glucose-Capillary: 181 mg/dL — ABNORMAL HIGH (ref 70–99)

## 2022-09-01 NOTE — Progress Notes (Signed)
SAUEL, ROBNETT A ([id number]) [id number] Page 1 of 1 Visit Report for 08/31/2022 SuperBill Details Patient Name: Date of Service: [name] 08/31/2022 Medical Record Number: [id number] Patient Account Number: [id number] Date of Birth/Sex: Treating RN: 1955/10/20 (67 y.o. [name] Primary Care Provider: [name] Other Clinician: [name] Referring Provider: Treating Provider/Extender: Delton See in Treatment: 19 Diagnosis Coding ICD-10 Codes Code Description [phone] Non-pressure chronic ulcer of other part of left foot with fat layer exposed M86.172 Other acute osteomyelitis, left ankle and foot L97.528 Non-pressure chronic ulcer of other part of left foot with other specified severity E11.621 Type 2 diabetes mellitus with foot ulcer E11.42 Type 2 diabetes mellitus with diabetic polyneuropathy S91.301A Unspecified open wound, right foot, initial encounter 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 severity Facility Procedures CPT4 Code Description Modifier Quantity [id number]-(Facility Use Only) HBOT full body chamber, 23mn , 4 ICD-10 Diagnosis Description E11.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 M86.172 Other acute osteomyelitis, left ankle and foot Physician Procedures Quantity CPT4 Code Description Modifier [id number] - WC PHYS HYPERBARIC OXYGEN THERAPY 1 ICD-10 Diagnosis Description E11.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

## 2022-09-01 NOTE — Progress Notes (Signed)
[name], [name] ([id number]) [id number] Page 1 of 2 Visit Report for 08/31/2022 Arrival Information Details Patient Name: Date of Service: [name], [name]. 08/31/2022 8:00 [name] Medical Record Number: [id number] Patient Account Number: [id number] Date of Birth/Sex: Treating RN: 01-04-1956 (67 y.o. [name] Primary Care Yordy Matton: [name] Other Clinician: [name] Referring David Rodriquez: Treating Shruti Arrey/Extender: Delton See in Treatment: 19 Visit Information History Since Last Visit All ordered tests and consults were completed: Yes Patient Arrived: Kasandra Knudsen Added or deleted any medications: No Arrival Time: 07:15 Any new allergies or adverse reactions: No Accompanied By: self Had [name] fall or experienced change in No Transfer Assistance: None activities of daily living that may affect Patient Identification Verified: Yes risk of falls: Secondary Verification Process Completed: Yes Signs or symptoms of abuse/neglect since last visito No Patient Requires Transmission-Based Precautions: No Hospitalized since last visit: No Patient Has Alerts: Yes Implantable device outside of the clinic excluding No cellular tissue based products placed in the center since last visit: Pain Present Now: No Electronic Signature(s) Signed: 08/31/2022 2:18:44 PM By: [name] CHT EMT BS , , Entered By: [name] on 08/31/2022 14:18:44 -------------------------------------------------------------------------------- Encounter Discharge Information Details Patient Name: Date of Service: [name], [name]. 08/31/2022 8:00 [name] Medical Record Number: [id number] Patient Account Number: [id number] Date of Birth/Sex: Treating RN: 12-27-1955 (67 y.o. [name] Primary Care Kilo Eshelman: [name] Other Clinician: [name] Referring Khari Lett: Treating Zayaan Kozak/Extender: Delton See in Treatment: 19 Encounter Discharge Information Items Discharge Condition: Stable Ambulatory Status: Ambulatory Discharge Destination: Home Transportation: Private Auto Accompanied By: self Schedule Follow-up Appointment: No Clinical Summary of Care: Electronic Signature(s) Signed: 08/31/2022 2:47:05 PM By: [name] CHT EMT BS , , Entered By: [name] on 08/31/2022 14:47:05 -------------------------------------------------------------------------------- Vitals Details Patient Name: Date of Service: [name], [name]. 08/31/2022 8:00 [name] Medical Record Number: [id number] Patient Account Number: [id number] Date of Birth/Sex: Treating RN: 1955/09/18 (67 y.o. [name] Primary Care Onelia Cadmus: [name] Other Clinician: [name] Referring Eliaz Fout: Treating Amitai Delaughter/Extender: Romie Jumper, [name] in Treatment: 19 Vital Signs Time Taken: 07:29 Temperature (F): 98.1 [name], [name] ([id number]) [id number] Page 2 of 2 Height (in): 72 Pulse (bpm): 87 Weight (lbs): 220 Respiratory Rate (breaths/min): 18 Body Mass Index (BMI): 29.8 Blood Pressure (mmHg): 150/84 Capillary Blood Glucose (mg/dl): 196 Reference Range: 80 - 120 mg / dl Electronic Signature(s) Signed: 08/31/2022 2:19:30 PM By: [name] CHT EMT BS , , Entered By: [name] on 08/31/2022 14:19:30

## 2022-09-01 NOTE — Progress Notes (Signed)
[name], [name] A ([id number]) [id number] Page 1 of 2 Visit Report for 08/31/2022 HBO Details Patient Name: Date of Service: [name], [name] A. 08/31/2022 8:00 A M Medical Record Number: [id number] Patient Account Number: [id number] Date of Birth/Sex: Treating RN: 1955/11/06 (67 y.o. [name] Primary Care [name]: [name] Other Clinician: [name] Referring [name]: Treating [name]: [name] in Treatment: 76 HBO Treatment Course Details Treatment Course Number: 1 Ordering Denyce Harr: [name] T Treatments Ordered: otal 80 HBO Treatment Start Date: 06/01/2022 HBO Indication: Diabetic Ulcer(s) of the Lower Extremity Standard/Conservative Wound Care tried and failed greater than or equal to 30 days HBO Treatment Details Treatment Number: 55 Patient Type: Outpatient Chamber Type: Monoplace Chamber Serial #: [id number] Treatment Protocol: 2.5 ATA with 90 minutes oxygen, with two 5 minute air breaks Treatment Details Compression Rate Down: 1.5 psi / minute De-Compression Rate Up: 2.0 psi / minute A breaks and breathing ir Compress Tx Pressure periods Decompress Decompress Begins Reached (leave unused spaces Begins Ends blank) Chamber Pressure (ATA 1 2.5 2.5 2.5 2.5 2.5 - - 2.5 1 ) Clock Time (24 hr) 07:54 08:06 08:36 08:41 09:11 09:16 - - 09:46 09:57 Treatment Length: 123 (minutes) Treatment Segments: 4 Vital Signs Capillary Blood Glucose Reference Range: 80 - 120 mg / dl HBO Diabetic Blood Glucose Intervention Range: <131 mg/dl or >249 mg/dl Type: Time Vitals Blood Respiratory Capillary Blood Glucose Pulse Action Pulse: Temperature: Taken: Pressure: Rate: Glucose (mg/dl): Meter #: Oximetry (%) Taken: Pre 07:29 150/84 87 18 98.1 196 1 none Post 10:05 160/84 75 18 97.1 107 1 none per protocol Treatment Response Treatment Toleration: Well Treatment Completion Status: Treatment  Completed without Adverse Event Treatment Notes Patient arrived, blood glucose was 196 mg/dL. Patient states he ate [place] sausage egg and cheese biscuit with orange juice this morning. He prepared for treatment and after performing safety check, patient was placed in the chamber with compression rate of 1 psi/min until confirming normal ear equalization and then rate set was increased to 2 psi/min until reaching treatment depth of 2.5 ATA. Patient tolerated treatment and decompression of the chamber at 2 psi/min. Post-treatment blood glucose level was 107 mg/dL. Per protocol patient was discharged and was stable upon departure. Additional Procedure Documentation Tissue Sevierity: Necrosis of bone Physician HBO Attestation: I certify that I supervised this HBO treatment in accordance with Medicare guidelines. A trained emergency response team is readily available per Yes hospital policies and procedures. Continue HBOT as ordered. Yes Electronic Signature(s) Signed: 08/31/2022 5:10:23 PM By: [name] Maudlin MD FACS Previous Signature: 08/31/2022 2:45:43 PM Version By: [name] CHT EMT BS , , Previous Signature: 08/31/2022 2:22:02 PM Version By: [name] CHT EMT BS , , Entered By: [name] on 08/31/2022 17:10:22 [name] ([id number] Page 2 of 2 -------------------------------------------------------------------------------- HBO Safety Checklist Details Patient Name: Date of Service: [name], [name] A. 08/31/2022 8:00 A M Medical Record Number: [id number] Patient Account Number: [id number] Date of Birth/Sex: Treating RN: [birth year] (67 y.o. [name] Primary Care [name]: [name] Other Clinician: [name] Referring [name]: Treating [name]/Extender: [name] in Treatment: 19 HBO Safety Checklist Items Safety Checklist Consent Form Signed Patient voided / foley  secured and emptied When did you last eato 0650 Last dose of injectable or oral agent 0620 Ostomy pouch emptied and vented if applicable NA All implantable devices assessed, documented and approved NA Intravenous access site secured and place NA Valuables  secured Linens and cotton and cotton/polyester blend (less than 51% polyester) Personal oil-based products / skin lotions / body lotions removed Wigs or hairpieces removed NA Smoking or tobacco materials removed NA Books / newspapers / magazines / loose paper removed Cologne, aftershave, perfume and deodorant removed Jewelry removed (may wrap wedding band) Make-up removed NA Hair care products removed Battery operated devices (external) removed Heating patches and chemical warmers removed Titanium eyewear removed Nail polish cured greater than 10 hours NA Casting material cured greater than 10 hours NA Hearing aids removed NA Loose dentures or partials removed dentures removed Prosthetics have been removed NA Patient demonstrates correct use of air break device (if applicable) Patient concerns have been addressed Patient grounding bracelet on and cord attached to chamber Specifics for Inpatients (complete in addition to above) Medication sheet sent with patient NA Intravenous medications needed or due during therapy sent with patient NA Drainage tubes (e.g. nasogastric tube or chest tube secured and vented) NA Endotracheal or Tracheotomy tube secured NA Cuff deflated of air and inflated with saline NA Airway suctioned NA Notes Paper version used prior to treatment start. Electronic Signature(s) Signed: 08/31/2022 2:20:45 PM By: [name] CHT EMT BS , , Entered By: [name] on 08/31/2022 14:20:45

## 2022-09-02 ENCOUNTER — Encounter (HOSPITAL_BASED_OUTPATIENT_CLINIC_OR_DEPARTMENT_OTHER): Payer: Medicare HMO | Admitting: Physician Assistant

## 2022-09-02 DIAGNOSIS — E11621 Type 2 diabetes mellitus with foot ulcer: Secondary | ICD-10-CM | POA: Diagnosis not present

## 2022-09-02 LAB — GLUCOSE, CAPILLARY
Glucose-Capillary: 170 mg/dL — ABNORMAL HIGH (ref 70–99)
Glucose-Capillary: 102 mg/dL — ABNORMAL HIGH (ref 70–99)

## 2022-09-02 NOTE — Progress Notes (Addendum)
KDEN, SEEBER [name] ([id number]) [id number] Page 1 of 2 Visit Report for 09/01/2022 HBO Details Patient Name: Date of Service: [name], [name]. 09/01/2022 8:00 [name] Medical Record Number: [id number] Patient Account Number: [id number] Date of Birth/Sex: Treating RN: 1955-07-08 (67 y.o. [name], [name] Primary Care Rochanda Harpham: [name] Other Clinician: [name] Referring Rejeana Fadness: Treating Daelyn Mozer/Extender: [name] in Treatment: 20 HBO Treatment Course Details Treatment Course Number: 1 Ordering Zeba Luby: [name] T Treatments Ordered: otal 80 HBO Treatment Start Date: 06/01/2022 HBO Indication: Diabetic Ulcer(s) of the Lower Extremity Standard/Conservative Wound Care tried and failed greater than or equal to 30 days HBO Treatment Details Treatment Number: 56 Patient Type: Outpatient Chamber Type: Monoplace Chamber Serial #: [id number] Treatment Protocol: 2.5 ATA with 90 minutes oxygen, with two 5 minute air breaks Treatment Details Compression Rate Down: 1.5 psi / minute De-Compression Rate Up: 2.0 psi / minute [name] breaks and breathing ir Compress Tx Pressure periods Decompress Decompress Begins Reached (leave unused spaces Begins Ends blank) Chamber Pressure (ATA 1 2.5 2.5 2.5 2.5 2.5 - - 2.5 1 ) Clock Time (24 hr) 07:54 08:07 08:37 08:42 09:12 09:17 - - 09:47 10:00 Treatment Length: 126 (minutes) Treatment Segments: 4 Vital Signs Capillary Blood Glucose Reference Range: 80 - 120 mg / dl HBO Diabetic Blood Glucose Intervention Range: <131 mg/dl or >249 mg/dl Type: Time Vitals Blood Respiratory Capillary Blood Glucose Pulse Action Pulse: Temperature: Taken: Pressure: Rate: Glucose (mg/dl): Meter #: Oximetry (%) Taken: Pre 07:29 165/98 100 18 98.1 228 1 none per protocol Post 10:05 167/107 98 18 97.8 181 1 manual bp taken Post 10:15 172/90 (manual BP) Treatment Response Treatment Toleration:  Well Treatment Completion Status: Treatment Completed without Adverse Event Treatment Notes Patient arrived, blood glucose measured at 228. Patient said he had his usual [place] breakfast and [name] little more at home. Patient prepared for treatment. After performing safety check, patient was placed in the chamber which was compressed with 100% oxygen at [name] rate of 1 psi/min until confirmation that ear equalization was proceeding as normal, at which time rate set was increased to 2 psi/min. Patient tolerated treatment and decompression of the chamber at 2 psi/min. Patient's diastolic was greater than 100 mmHg. BP was re-taken with manual cuff. Systolic BP [name] little high but within limits per protocol. Patient was stable upon discharge. Additional Procedure Documentation Tissue Sevierity: Necrosis of bone Physician HBO Attestation: I certify that I supervised this HBO treatment in accordance with Medicare guidelines. [name] trained emergency response team is readily available per Yes hospital policies and procedures. Continue HBOT as ordered. Yes Electronic Signature(s) Signed: 09/02/2022 1:54:46 PM By: [name] DO Previous Signature: 09/01/2022 11:00:50 AM Version By: [name] CHT EMT BS , , Previous Signature: 09/01/2022 3:46:53 PM Version By: [name] DO Previous Signature: 09/01/2022 10:26:07 AM Version By: [name] CHT EMT BS , , Previous Signature: 09/01/2022 9:23:22 AM Version By: [name] CHT EMT BS , , Entered By: [name] on 09/02/2022 13:53:46 [name] ([id number] Page 2 of 2 -------------------------------------------------------------------------------- HBO Safety Checklist Details Patient Name: Date of Service: [name], [name]. 09/01/2022 8:00 [name] Medical Record Number: [id number] Patient Account Number: [id number] Date of Birth/Sex: Treating RN: February 08, 1956 (67 y.o. [name] Primary Care Vera Wishart: [name] Other Clinician: [name] Referring Ivet Guerrieri: Treating Irean Kendricks/Extender: [name] in Treatment: 20 HBO Safety Checklist Items Safety Checklist Consent Form Signed Patient voided /  foley secured and emptied When did you last eato 0655 Last dose of injectable or oral agent 0620 Ostomy pouch emptied and vented if applicable NA All implantable devices assessed, documented and approved NA Intravenous access site secured and place NA Valuables secured Linens and cotton and cotton/polyester blend (less than 51% polyester) Personal oil-based products / skin lotions / body lotions removed Wigs or hairpieces removed NA Smoking or tobacco materials removed NA Books / newspapers / magazines / loose paper removed Cologne, aftershave, perfume and deodorant removed Jewelry removed (may wrap wedding band) Make-up removed NA Hair care products removed Battery operated devices (external) removed Heating patches and chemical warmers removed Titanium eyewear removed Nail polish cured greater than 10 hours NA Casting material cured greater than 10 hours NA Hearing aids removed NA Loose dentures or partials removed dentures removed Prosthetics have been removed NA Patient demonstrates correct use of air break device (if applicable) Patient concerns have been addressed Patient grounding bracelet on and cord attached to chamber Specifics for Inpatients (complete in addition to above) Medication sheet sent with patient NA Intravenous medications needed or due during therapy sent with patient NA Drainage tubes (e.g. nasogastric tube or chest tube secured and vented) NA Endotracheal or Tracheotomy tube secured NA Cuff deflated of air and inflated with saline NA Airway suctioned NA Notes Paper version used prior to treatment start. Electronic Signature(s) Signed: 09/01/2022 9:20:17 AM By: [name] CHT EMT BS , , Entered By: [name] on 09/01/2022 09:20:17

## 2022-09-02 NOTE — Progress Notes (Signed)
[name], RICCELLI A ([id number]) [id number] Page 1 of 2 Visit Report for 09/01/2022 Arrival Information Details Patient Name: Date of Service: [name], [name] 09/01/2022 8:00 A M Medical Record Number: [id number] Patient Account Number: [id number] Date of Birth/Sex: Treating RN: [birth year] (67 y.o. [name] Primary Care Joeanna Howdyshell: [name] Other Clinician: [name] Referring Jerra Huckeby: Treating Maurita Havener/Extender: [name] in Treatment: 20 Visit Information History Since Last Visit All ordered tests and consults were completed: Yes Patient Arrived: Cane Added or deleted any medications: No Arrival Time: 07:15 Any new allergies or adverse reactions: No Accompanied By: self Had a fall or experienced change in No Transfer Assistance: None activities of daily living that may affect Patient Identification Verified: Yes risk of falls: Secondary Verification Process Completed: Yes Signs or symptoms of abuse/neglect since last visito No Patient Requires Transmission-Based Precautions: No Hospitalized since last visit: No Patient Has Alerts: Yes Implantable device outside of the clinic excluding No cellular tissue based products placed in the center since last visit: Pain Present Now: No Electronic Signature(s) Signed: 09/01/2022 9:17:13 AM By: [name] CHT EMT BS , , Entered By: [name] on 09/01/2022 09:17:13 -------------------------------------------------------------------------------- Encounter Discharge Information Details Patient Name: Date of Service: [name], [name] 09/01/2022 8:00 A M Medical Record Number: [id number] Patient Account Number: [id number] Date of Birth/Sex: Treating RN: Mar 16, 1956 (67 y.o. [name] Primary Care Kenasia Scheller: [name] Other Clinician: [name] Referring Fumie Fiallo: Treating Rainey Kahrs/Extender: [name] in Treatment: 20 Encounter Discharge Information Items Discharge Condition: Stable Ambulatory Status: Wheelchair Discharge Destination: Home Transportation: Private Auto Accompanied By: self Schedule Follow-up Appointment: No Clinical Summary of Care: Notes Patient arrives with cane and uses center's wheelchair. Electronic Signature(s) Signed: 09/01/2022 11:02:43 AM By: [name] CHT EMT BS , , Entered By: [name] on 09/01/2022 11:02:43 -------------------------------------------------------------------------------- Vitals Details Patient Name: Date of Service: [name], [name] 09/01/2022 8:00 A M Medical Record Number: [id number] Patient Account Number: [id number] Date of Birth/Sex: Treating RN: May 06, 1956 (67 y.o. [name] Primary Care Dreanna Kyllo: [name] Other Clinician: [name] Referring Yamilee Harmes: Treating Tyrek Lawhorn/Extender: [name], [name] in Treatment: 20 KYALL, WESBY A ([id number]) [id number] Page 2 of 2 Vital Signs Time Taken: 07:29 Temperature (F): 98.1 Height (in): 72 Pulse (bpm): 100 Weight (lbs): 220 Respiratory Rate (breaths/min): 18 Body Mass Index (BMI): 29.8 Blood Pressure (mmHg): 165/98 Capillary Blood Glucose (mg/dl): 228 Reference Range: 80 - 120 mg / dl Electronic Signature(s) Signed: 09/01/2022 9:17:59 AM By: [name] CHT EMT BS , , Entered By: [name] on 09/01/2022 09:17:59

## 2022-09-02 NOTE — Progress Notes (Signed)
LEQUAN, VILLARROEL A ([id number]) [id number] Page 1 of 1 Visit Report for 09/01/2022 SuperBill Details Patient Name: Date of Service: [name], [name] 09/01/2022 Medical Record Number: [id number] Patient Account Number: [id number] Date of Birth/Sex: Treating RN: 10-03-1955 (67 y.o. 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 S91.301A Unspecified open wound, right foot, initial encounter 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 severity Facility Procedures CPT4 Code Description Modifier Quantity [id number] G0277-(Facility Use Only) HBOT full body chamber, 3mn , 4 ICD-10 Diagnosis Description E11.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 M86.172 Other acute osteomyelitis, left ankle and foot Physician Procedures Quantity CPT4 Code Description Modifier [id number] - WC PHYS HYPERBARIC OXYGEN THERAPY 1 ICD-10 Diagnosis Description E11.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

## 2022-09-03 ENCOUNTER — Encounter (HOSPITAL_BASED_OUTPATIENT_CLINIC_OR_DEPARTMENT_OTHER): Payer: Medicare HMO | Admitting: Internal Medicine

## 2022-09-03 DIAGNOSIS — L97528 Non-pressure chronic ulcer of other part of left foot with other specified severity: Secondary | ICD-10-CM | POA: Diagnosis not present

## 2022-09-03 DIAGNOSIS — L97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 severity: Secondary | ICD-10-CM | POA: Diagnosis not present

## 2022-09-03 DIAGNOSIS — M86172 Other acute osteomyelitis, left ankle and foot: Secondary | ICD-10-CM

## 2022-09-03 DIAGNOSIS — L97522 Non-pressure chronic ulcer of other part of left foot with fat layer exposed: Secondary | ICD-10-CM

## 2022-09-03 DIAGNOSIS — E11621 Type 2 diabetes mellitus with foot ulcer: Secondary | ICD-10-CM | POA: Diagnosis not present

## 2022-09-03 LAB — GLUCOSE, CAPILLARY
Glucose-Capillary: 225 mg/dL — ABNORMAL HIGH (ref 70–99)
Glucose-Capillary: 137 mg/dL — ABNORMAL HIGH (ref 70–99)

## 2022-09-03 NOTE — Progress Notes (Signed)
ROMALE, SMAW A ([id number]) [id number] Page 1 of 1 Visit Report for 09/02/2022 SuperBill Details Patient Name: Date of Service: [name], [name] 09/02/2022 Medical Record Number: [id number] Patient Account Number: [id number] Date of Birth/Sex: Treating RN: [birth year] (67 y.o. 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 S91.301A Unspecified open wound, right foot, initial encounter 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 severity Facility Procedures CPT4 Code Description Modifier Quantity [id number] G0277-(Facility Use Only) HBOT full body chamber, 55mn , 4 ICD-10 Diagnosis Description E11.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 M86.172 Other acute osteomyelitis, left ankle and foot Physician Procedures Quantity CPT4 Code Description Modifier [id number] - WC PHYS HYPERBARIC OXYGEN THERAPY 1 ICD-10 Diagnosis Description E11.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 M86.172 Other acute osteomyelitis, left ankle and  foot Electronic Signature(s) Signed: 09/02/2022 11:28:05 AM By: SDonavan BurnetCHT EMT BS , , Signed: 09/02/2022 3:56:54 PM By: SWorthy KeelerPA-C Entered By: SDonavan Burneton 09/02/2022 11:28:04

## 2022-09-03 NOTE — Progress Notes (Signed)
ARSHAUN, ROSENGRANT [name] ([id number]) [id number] Page 1 of 2 Visit Report for 09/02/2022 Arrival Information Details Patient Name: Date of Service: [name], [name] 09/02/2022 8:00 [name] Medical Record Number: [id number] Patient Account Number: [id number] Date of Birth/Sex: Treating RN: [birth year] (67 y.o. [name] Primary Care [name]: [name] Other Clinician: [name] Referring [name]: Treating Kalila Adkison/Extender: [name], [name] in Treatment: 20 Visit Information History Since Last Visit All ordered tests and consults were completed: Yes Patient Arrived: [name] Added or deleted any medications: No Arrival Time: 07:30 Any new allergies or adverse reactions: No Accompanied By: self Had [name] fall or experienced change in No Transfer Assistance: None activities of daily living that may affect Patient Identification Verified: Yes risk of falls: Secondary Verification Process Completed: Yes Signs or symptoms of abuse/neglect since last visito No Patient Requires Transmission-Based Precautions: No Hospitalized since last visit: No Patient Has Alerts: Yes Implantable device outside of the clinic excluding No cellular tissue based products placed in the center since last visit: Pain Present Now: No Electronic Signature(s) Signed: 09/02/2022 10:58:29 AM By: [name] CHT EMT BS , , Entered By: [name] on 09/02/2022 10:58:28 -------------------------------------------------------------------------------- Encounter Discharge Information Details Patient Name: Date of Service: [name], [name] 09/02/2022 8:00 [name] Medical Record Number: [id number] Patient Account Number: [id number] Date of Birth/Sex: Treating RN: 08/20/1955 (67 y.o. [name] Primary Care [name]: [name] Other Clinician: [name] Referring [name]: Treating [name]/Extender: [name],  [name] in Treatment: 20 Encounter Discharge Information Items Discharge Condition: Stable Ambulatory Status: Ambulatory Discharge Destination: Home Transportation: Private Auto Accompanied By: self Schedule Follow-up Appointment: No Clinical Summary of Care: Electronic Signature(s) Signed: 09/02/2022 11:28:28 AM By: [name] CHT EMT BS , , Entered By: [name] on 09/02/2022 11:28:28 -------------------------------------------------------------------------------- Vitals Details Patient Name: Date of Service: [name], [name] 09/02/2022 8:00 [name] Medical Record Number: [id number] Patient Account Number: [id number] Date of Birth/Sex: Treating RN: 01-Oct-1955 (67 y.o. [name] Primary Care [name]: [name] Other Clinician: [name] Referring [name]: Treating [name]/Extender: [name], [name] in Treatment: 20 Vital Signs Time Taken: 07:36 Temperature (F): 97.3 [name] ([id number]) [id number] Page 2 of 2 Height (in): 72 Pulse (bpm): 78 Weight (lbs): 220 Respiratory Rate (breaths/min): 16 Body Mass Index (BMI): 29.8 Blood Pressure (mmHg): 149/95 Capillary Blood Glucose (mg/dl): 170 Reference Range: 80 - 120 mg / dl Electronic Signature(s) Signed: 09/02/2022 11:11:36 AM By: [name] CHT EMT BS , , Entered By: [name] on 09/02/2022 11:11:35

## 2022-09-03 NOTE — Progress Notes (Addendum)
ARIS, DEROUSSE A ([id number]) [id number] Page 1 of 2 Visit Report for 09/02/2022 HBO Details Patient Name: Date of Service: [name], [name] 09/02/2022 8:00 A M Medical Record Number: [id number] Patient Account Number: [id number] Date of Birth/Sex: Treating RN: January 21, 1956 (67 y.o. [name] Primary Care Daron Breeding: [name] Other Clinician: [name] Referring Chakita Mcgraw: Treating Harumi Yamin/Extender: [name], [name] in Treatment: 20 HBO Treatment Course Details Treatment Course Number: 1 Ordering Kynzlie Hilleary: [name] T Treatments Ordered: otal 80 HBO Treatment Start Date: 06/01/2022 HBO Indication: Diabetic Ulcer(s) of the Lower Extremity Standard/Conservative Wound Care tried and failed greater than or equal to 30 days HBO Treatment Details Treatment Number: 57 Patient Type: Outpatient Chamber Type: Monoplace Chamber Serial #: [id number] Treatment Protocol: 2.5 ATA with 90 minutes oxygen, with two 5 minute air breaks Treatment Details Compression Rate Down: 1.5 psi / minute De-Compression Rate Up: 2.0 psi / minute A breaks and breathing ir Compress Tx Pressure periods Decompress Decompress Begins Reached (leave unused spaces Begins Ends blank) Chamber Pressure (ATA 1 2.5 2.5 2.5 2.5 2.5 - - 2.5 1 ) Clock Time (24 hr) 07:58 08:12 08:42 08:47 09:17 09:22 - - 09:52 10:02 Treatment Length: 124 (minutes) Treatment Segments: 4 Vital Signs Capillary Blood Glucose Reference Range: 80 - 120 mg / dl HBO Diabetic Blood Glucose Intervention Range: <131 mg/dl or >249 mg/dl Type: Time Vitals Blood Respiratory Capillary Blood Glucose Pulse Action Pulse: Temperature: Taken: Pressure: Rate: Glucose (mg/dl): Meter #: Oximetry (%) Taken: Pre 07:36 149/95 78 16 97.3 170 1 none per protocol Post 10:08 173/82 77 16 97 102 1 none per protocol Treatment Response Treatment Toleration: Well Treatment Completion Status:  Treatment Completed without Adverse Event Treatment Notes Patient arrived, blood glucose was normal and he had breakfast (biscuit, egg and cheese with orang juice). Patient prepared for treatment, and after performing safety check he was placed in the chamber which was compressed with 100% oxygen at a rate of 1 psi/min until patient confirmed normal ear equalization. Patient tolerated treatment and subsequent decompression of the chamber at 2 psi/min. Post-treatment vital signs were normal. Patient was stable upon discharge. Additional Procedure Documentation Tissue Sevierity: Necrosis of bone Electronic Signature(s) Signed: 09/03/2022 11:25:27 AM By: [name] CHT EMT BS , , Signed: 09/03/2022 4:49:28 PM By: Worthy Keeler PA-C Previous Signature: 09/02/2022 11:27:31 AM Version By: [name] CHT EMT BS , , Previous Signature: 09/02/2022 3:56:54 PM Version By: Worthy Keeler PA-C Entered By: [name] on 09/03/2022 11:25:26 -------------------------------------------------------------------------------- HBO Safety Checklist Details Patient Name: Date of Service: [name], [name] 09/02/2022 8:00 A M Medical Record Number: [id number] Patient Account Number: [id number] Date of Birth/Sex: Treating RN: 1955/07/14 (67 y.o. [name], [name] Barrier A ([id number]) [id number] Page 2 of 2 Primary Care Mosiah Bastin: [name] Other Clinician: [name] Referring Daney Moor: Treating Jacklyne Baik/Extender: [name], [name] in Treatment: 20 HBO Safety Checklist Items Safety Checklist Consent Form Signed Patient voided / foley secured and emptied When did you last eato 0650 Last dose of injectable or oral agent 0625 Ostomy pouch emptied and vented if applicable NA All implantable devices assessed, documented and approved NA Intravenous access site secured and place NA Valuables secured Linens and cotton and  cotton/polyester blend (less than 51% polyester) Personal oil-based products / skin lotions / body lotions removed Wigs or hairpieces removed NA Smoking or tobacco materials removed NA Books / newspapers / magazines / loose paper removed [place],  aftershave, perfume and deodorant removed Jewelry removed (may wrap wedding band) Make-up removed NA Hair care products removed Battery operated devices (external) removed Heating patches and chemical warmers removed Titanium eyewear removed Nail polish cured greater than 10 hours NA Casting material cured greater than 10 hours NA Hearing aids removed NA Loose dentures or partials removed dentures removed Prosthetics have been removed NA Patient demonstrates correct use of air break device (if applicable) Patient concerns have been addressed Patient grounding bracelet on and cord attached to chamber Specifics for Inpatients (complete in addition to above) Medication sheet sent with patient NA Intravenous medications needed or due during therapy sent with patient NA Drainage tubes (e.g. nasogastric tube or chest tube secured and vented) NA Endotracheal or Tracheotomy tube secured NA Cuff deflated of air and inflated with saline NA Airway suctioned NA Notes Paper version used prior to treatment start. Electronic Signature(s) Signed: 09/02/2022 11:25:56 AM By: [name] CHT EMT BS , , Entered By: [name] on 09/02/2022 11:25:55

## 2022-09-04 ENCOUNTER — Ambulatory Visit (INDEPENDENT_AMBULATORY_CARE_PROVIDER_SITE_OTHER): Payer: Medicare HMO | Admitting: Vascular Surgery

## 2022-09-04 ENCOUNTER — Other Ambulatory Visit: Payer: Self-pay

## 2022-09-04 ENCOUNTER — Encounter (HOSPITAL_BASED_OUTPATIENT_CLINIC_OR_DEPARTMENT_OTHER): Payer: Medicare HMO | Attending: Internal Medicine | Admitting: Internal Medicine

## 2022-09-04 ENCOUNTER — Encounter: Payer: Self-pay | Admitting: Vascular Surgery

## 2022-09-04 ENCOUNTER — Ambulatory Visit (INDEPENDENT_AMBULATORY_CARE_PROVIDER_SITE_OTHER)
Admission: RE | Admit: 2022-09-04 | Discharge: 2022-09-04 | Disposition: A | Discharge status: Home / Self Care | Payer: Medicare HMO | Source: Ambulatory Visit | Attending: Vascular Surgery | Admitting: Vascular Surgery

## 2022-09-04 ENCOUNTER — Ambulatory Visit (HOSPITAL_COMMUNITY)
Admission: RE | Admit: 2022-09-04 | Discharge: 2022-09-04 | Disposition: A | Discharge status: Home / Self Care | Payer: Medicare HMO | Source: Ambulatory Visit | Attending: Vascular Surgery | Admitting: Vascular Surgery

## 2022-09-04 VITALS — BP 154/82 | HR 73 | Temp 97.9°F | Resp 20 | Ht 72.0 in | Wt 220.0 lb

## 2022-09-04 DIAGNOSIS — I70201 Unspecified atherosclerosis of native arteries of extremities, right leg: Secondary | ICD-10-CM | POA: Diagnosis not present

## 2022-09-04 DIAGNOSIS — I70221 Atherosclerosis of native arteries of extremities with rest pain, right leg: Secondary | ICD-10-CM

## 2022-09-04 DIAGNOSIS — L97528 Non-pressure chronic ulcer of other part of left foot with other specified severity: Secondary | ICD-10-CM | POA: Diagnosis not present

## 2022-09-04 DIAGNOSIS — E11621 Type 2 diabetes mellitus with foot ulcer: Secondary | ICD-10-CM | POA: Diagnosis not present

## 2022-09-04 DIAGNOSIS — G4733 Obstructive sleep apnea (adult) (pediatric): Secondary | ICD-10-CM | POA: Diagnosis not present

## 2022-09-04 DIAGNOSIS — M86172 Other acute osteomyelitis, left ankle and foot: Secondary | ICD-10-CM

## 2022-09-04 DIAGNOSIS — I70223 Atherosclerosis of native arteries of extremities with rest pain, bilateral legs: Secondary | ICD-10-CM | POA: Diagnosis not present

## 2022-09-04 DIAGNOSIS — X58XXXA Exposure to other specified factors, initial encounter: Secondary | ICD-10-CM | POA: Diagnosis not present

## 2022-09-04 DIAGNOSIS — L97522 Non-pressure chronic ulcer of other part of left foot with fat layer exposed: Secondary | ICD-10-CM | POA: Insufficient documentation

## 2022-09-04 DIAGNOSIS — E1151 Type 2 diabetes mellitus with diabetic peripheral angiopathy without gangrene: Secondary | ICD-10-CM | POA: Insufficient documentation

## 2022-09-04 DIAGNOSIS — Z9889 Other specified postprocedural states: Secondary | ICD-10-CM | POA: Diagnosis not present

## 2022-09-04 DIAGNOSIS — L97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 severity: Secondary | ICD-10-CM | POA: Diagnosis not present

## 2022-09-04 DIAGNOSIS — E1142 Type 2 diabetes mellitus with diabetic polyneuropathy: Secondary | ICD-10-CM | POA: Diagnosis not present

## 2022-09-04 DIAGNOSIS — J45909 Unspecified asthma, uncomplicated: Secondary | ICD-10-CM | POA: Diagnosis not present

## 2022-09-04 DIAGNOSIS — S91301A Unspecified open wound, right foot, initial encounter: Secondary | ICD-10-CM | POA: Diagnosis not present

## 2022-09-04 LAB — GLUCOSE, CAPILLARY
Glucose-Capillary: 194 mg/dL — ABNORMAL HIGH (ref 70–99)
Glucose-Capillary: 101 mg/dL — ABNORMAL HIGH (ref 70–99)

## 2022-09-04 LAB — VAS US ABI WITH/WO TBI
Left ABI: 0.62
Right ABI: 0.98

## 2022-09-04 NOTE — Progress Notes (Signed)
Office Note     CC:  follow up Requesting Provider:  Harvie Junior, MD  HPI: [name] is a 67 y.o. (10-12-1955) male well-known to me who has severe peripheral arterial disease in bilateral lower extremities.    Surgical history includes: Right below-knee pop to posterior tibial artery bypass using vein, with subsequent balloon angioplasty for assisted primary patency at retained valve. Right TMA. 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  At his last visit, the right TMA had completely healed, and the left was closed, however both now have ulcerations (left greater than right).  I did not look at these today as they were covered, and he has to keep in this way however he is currently going to hyperbaric therapy 5 days a week to a depth of roughly 50 feet.  He states the left-sided TMA wound is healing well.    Past Medical History:  Diagnosis Date   Diabetes mellitus with peripheral vascular disease    Hypertension    Neuropathy    PTSD (post-traumatic stress disorder)    Sleep apnea     Past Surgical History:  Procedure Laterality Date   ABDOMINAL AORTOGRAM W/LOWER EXTREMITY Right 09/10/2021   Procedure: ABDOMINAL AORTOGRAM W/LOWER EXTREMITY;  Surgeon: [name], MD;  Location: [place] [facility];  Service: Cardiovascular;  Laterality: Right;   ABDOMINAL AORTOGRAM W/LOWER EXTREMITY N/A 12/17/2021   Procedure: ABDOMINAL AORTOGRAM W/LOWER EXTREMITY;  Surgeon: [name], MD;  Location: [place] [facility];  Service: Cardiovascular;  Laterality: N/A;   ABDOMINAL AORTOGRAM W/LOWER EXTREMITY N/A 12/03/2021   Procedure: ABDOMINAL AORTOGRAM W/LOWER EXTREMITY;  Surgeon: [name], MD;  Location: [place] [facility];  Service: Cardiovascular;  Laterality: N/A;   ABDOMINAL HERNIA REPAIR     AMPUTATION Right  09/19/2021   Procedure: TRANSMETATARSAL AMPUTATION FOOT;  Surgeon: Lorenda Peck, MD;  Location: [place];  Service: Podiatry;  Laterality: Right;  Surgical team to do local block   AMPUTATION Left 12/20/2021   Procedure: LEFT FOOT REVISION PARTIAL FIRST RAY AMPUTATION WITH PLACEMENT OF PRESSURE WOUND VAC;  Surgeon: [name], DPM;  Location: [place];  Service: Podiatry;  Laterality: Left;   AMPUTATION TOE Right 09/15/2021   Procedure: AMPUTATION 4th TOE;  Surgeon: [name], MD;  Location: [place];  Service: Vascular;  Laterality: Right;   AMPUTATION TOE Left 12/18/2021   Procedure: AMPUTATION LEFT BIG TOE; LEFT 5TH RAY PARTIAL AMPUTATION;  Surgeon: [name], DPM;  Location: [place];  Service: Podiatry;  Laterality: Left;   BACK SURGERY     CORONARY ATHERECTOMY N/A 12/08/2021   Procedure: CORONARY ATHERECTOMY;  Surgeon: Waynetta Sandy, MD;  Location: [place] [facility];  Service: Cardiovascular;  Laterality: N/A;  Laser - Lt. PT   FEMORAL-TIBIAL BYPASS GRAFT Right 09/15/2021   Procedure: BYPASS GRAFT BELOW KNEE POPLITEAL ARTERY TO POSTERIOR TIBIAL ARTERY;  Surgeon: [name], MD;  Location: [place];  Service: Vascular;  Laterality: Right;   IRRIGATION AND DEBRIDEMENT FOOT Right 09/17/2021   Procedure: IRRIGATION AND DEBRIDEMENT FOOT;  Surgeon: Lorenda Peck, MD;  Location: [place];  Service: Podiatry;  Laterality: Right;  Suregeon will do anesthesia block   KNEE SURGERY     LOWER EXTREMITY ANGIOGRAPHY Left 12/08/2021   Procedure: Lower Extremity Angiography;  Surgeon: Waynetta Sandy, MD;  Location:  [place] [facility];  Service: Cardiovascular;  Laterality: Left;   PERIPHERAL VASCULAR BALLOON ANGIOPLASTY  09/10/2021   Procedure: PERIPHERAL VASCULAR BALLOON ANGIOPLASTY;  Surgeon: [name], MD;  Location: [place] [facility];  Service: Cardiovascular;;  rt sfa pta   PERIPHERAL VASCULAR BALLOON ANGIOPLASTY Right 12/03/2021   Procedure: PERIPHERAL VASCULAR BALLOON  ANGIOPLASTY;  Surgeon: [name], MD;  Location: [place] [facility];  Service: Cardiovascular;  Laterality: Right;   PERIPHERAL VASCULAR INTERVENTION Left 12/08/2021   Procedure: PERIPHERAL VASCULAR INTERVENTION;  Surgeon: Waynetta Sandy, MD;  Location: [place] [facility];  Service: Cardiovascular;  Laterality: Left;  LT. SFA   SHOULDER SURGERY     VEIN HARVEST Right 09/15/2021   Procedure: GREATER SAPHENOUS VEIN HARVEST;  Surgeon: [name], MD;  Location: [facility];  Service: Vascular;  Laterality: Right;    Social History   Socioeconomic History   Marital status: Married    Spouse name: Not on file   Number of children: Not on file   Years of education: Not on file   Highest education level: Not on file  Occupational History   Not on file  Tobacco Use   Smoking status: Former    Types: Cigarettes    Passive exposure: Never   Smokeless tobacco: Never  Vaping Use   Vaping Use: Never used  Substance and Sexual Activity   Alcohol use: No   Drug use: Never   Sexual activity: Not on file  Other Topics Concern   Not on file  Social History Narrative   Not on file   Social Determinants of Health   Financial Resource Strain: Not on file  Food Insecurity: Not on file  Transportation Needs: Not on file  Physical Activity: Not on file  Stress: Not on file  Social Connections: Not on file  Intimate Partner Violence: Not on file    Family History  Problem Relation Age of Onset   Stroke Mother    Coronary artery disease Father    COPD Brother     Current Outpatient Medications  Medication Sig Dispense Refill   albuterol (VENTOLIN HFA) 108 (90 Base) MCG/ACT inhaler Inhale 2 puffs into the lungs every 6 (six) hours as needed for wheezing or shortness of breath. 8 g 0   amLODipine (NORVASC) 5 MG tablet Take 1 tablet (5 mg total) by mouth every morning. 30 tablet 0   aspirin EC 81 MG tablet Take 1 tablet (81 mg total) by mouth daily. Swallow whole. 150 tablet  2   atorvastatin (LIPITOR) 80 MG tablet Take 1 tablet (80 mg total) by mouth at bedtime. 30 tablet 0   azithromycin (ZITHROMAX) 250 MG tablet Take 1 tablet (250 mg total) by mouth daily. Take first 2 tablets together, then 1 every day until finished. 6 tablet 0   benzonatate (TESSALON) 100 MG capsule Take 1 capsule (100 mg total) by mouth every 8 (eight) hours. 28 capsule 0   buPROPion (WELLBUTRIN XL) 300 MG 24 hr tablet Take 1 tablet (300 mg total) by mouth every morning. 30 tablet 0   cholecalciferol (VITAMIN D) 25 MCG (1000 UNIT) tablet Take 1,000 Units by mouth daily.     clopidogrel (PLAVIX) 75 MG tablet Take 1 tablet (75 mg total) by mouth daily. 30 tablet 11   empagliflozin (JARDIANCE) 25 MG TABS tablet Take 1 tablet (25 mg total) by mouth daily. 30 tablet 0   fluticasone furoate-vilanterol (BREO ELLIPTA) 100-25 MCG/INH AEPB Inhale 1 puff  into the lungs daily. 60 each 5   gabapentin (NEURONTIN) 600 MG tablet Take 600 mg by mouth 2 (two) times daily.     HYDROcodone-acetaminophen (NORCO/VICODIN) 5-325 MG tablet Take 1 tablet by mouth every 6 (six) hours as needed.     insulin aspart (NOVOLOG) 100 UNIT/ML injection CBG 70 - 120: 0 units CBG 121 - 150: 1 unit CBG 151 - 200: 2 units CBG 201 - 250: 3 units CBG 251 - 300: 5 units CBG 301 - 350: 7 units CBG 351 - 400: 9 units 10 mL 11   insulin glargine-yfgn (SEMGLEE) 100 UNIT/ML injection Inject 0.2 mLs (20 Units total) into the skin 2 (two) times daily. 10 mL 11   lisinopril (ZESTRIL) 20 MG tablet Take 20 mg by mouth daily.     lurasidone (LATUDA) 40 MG TABS tablet Take 1 tablet (40 mg total) by mouth daily with supper. 30 tablet 0   meclizine (ANTIVERT) 25 MG tablet Take 1 tablet (25 mg total) by mouth 3 (three) times daily as needed for dizziness. 30 tablet 0   metoprolol tartrate (LOPRESSOR) 50 MG tablet Take 1 tablet (50 mg total) by mouth 2 (two) times daily. 60 tablet 0   OLANZapine (ZYPREXA) 2.5 MG tablet Take 1 tablet (2.5 mg total) by  mouth at bedtime. 30 tablet 0   omeprazole (PRILOSEC) 20 MG capsule Take 1 capsule (20 mg total) by mouth at bedtime. 30 capsule 0   OXcarbazepine (TRILEPTAL) 150 MG tablet Take 1 tablet (150 mg total) by mouth 2 (two) times daily. 60 tablet 0   Oxycodone HCl 10 MG TABS Take 10 mg by mouth every 6 (six) hours as needed.     senna-docusate (SENOKOT-S) 8.6-50 MG tablet Take 1 tablet by mouth at bedtime as needed for mild constipation.     No current facility-administered medications for this visit.    Allergies  Allergen Reactions   Gemfibrozil Other (See Comments)    Doesn't know its been awhile   Oxycontin [Oxycodone Hcl] Other (See Comments)    Bad reaction "took me to a place I never wanna go again"   Pork-Derived Products    Simvastatin Other (See Comments)    Says he doesn't know its been awhile     REVIEW OF SYSTEMS:   '[X]'$  denotes positive finding, '[ ]'$  denotes negative finding Cardiac  Comments:  Chest pain or chest pressure:    Shortness of breath upon exertion:    Short of breath when lying flat:    Irregular heart rhythm:        Vascular    Pain in calf, thigh, or hip brought on by ambulation:    Pain in feet at night that wakes you up from your sleep:     Blood clot in your veins:    Leg swelling:         Pulmonary    Oxygen at home:    Productive cough:     Wheezing:         Neurologic    Sudden weakness in arms or legs:     Sudden numbness in arms or legs:     Sudden onset of difficulty speaking or slurred speech:    Temporary loss of vision in one eye:     Problems with dizziness:         Gastrointestinal    Blood in stool:     Vomited blood:         Genitourinary  Burning when urinating:     Blood in urine:        Psychiatric    Major depression:         Hematologic    Bleeding problems:    Problems with blood clotting too easily:        Skin    Rashes or ulcers:        Constitutional    Fever or chills:      PHYSICAL  EXAMINATION:  Vitals:   09/04/22 1549  BP: (!) 154/82  Pulse: 73  Resp: 20  Temp: 97.9 F (36.6 C)  SpO2: 95%  Weight: 220 lb (99.8 kg)  Height: 6' (1.829 m)     General:  WDWN in NAD; vital signs documented above Gait: Not observed HENT: WNL, normocephalic Pulmonary: normal non-labored breathing , without Rales, rhonchi,  wheezing Cardiac: regular HR Abdomen: soft, NT, no masses Skin: without rashes Vascular Exam/Pulses: Excellent Doppler signals bilaterally. Extremities: Feet bandaged. Musculoskeletal: no muscle wasting or atrophy  Neurologic: A&O X 3;  No focal weakness or paresthesias are detected Psychiatric:  The pt has Normal affect.   Non-Invasive Vascular Imaging:     +--------+--------+-----+---------------+----------+--------+  RIGHT  PSV cm/sRatioStenosis       Waveform  Comments  +--------+--------+-----+---------------+----------+--------+  CFA Prox150                         triphasic           +--------+--------+-----+---------------+----------+--------+  DFA    113                         biphasic            +--------+--------+-----+---------------+----------+--------+  SFA Prox137                         biphasic            +--------+--------+-----+---------------+----------+--------+  SFA Mid 407          75-99% stenosismonophasicbrisk     +--------+--------+-----+---------------+----------+--------+       Right Graft #1: BK - PTA  +------------------+--------+--------+----------+--------+                   PSV cm/sStenosisWaveform  Comments  +------------------+--------+--------+----------+--------+  Inflow                                               +------------------+--------+--------+----------+--------+  Prox Anastomosis  147             monophasic          +------------------+--------+--------+----------+--------+  Proximal Graft    144             monophasic           +------------------+--------+--------+----------+--------+  Mid Graft         125             monophasic          +------------------+--------+--------+----------+--------+  Distal Graft      88              monophasic          +------------------+--------+--------+----------+--------+  Distal Anastomosis  NV        +------------------+--------+--------+----------+--------+  Outflow                                    NV        +------------------+--------+--------+----------+--------+        +----------+--------+-----+--------+----------+--------+  LEFT     PSV cm/sRatioStenosisWaveform  Comments  +----------+--------+-----+--------+----------+--------+  EIA Distal291                  biphasic            +----------+--------+-----+--------+----------+--------+  SFA Distal169                  biphasic            +----------+--------+-----+--------+----------+--------+  POP Mid   99                   monophasic          +----------+--------+-----+--------+----------+--------+     Left Stent(s):  +---------------+--------+---------------+---------+--------+  SFA           PSV cm/sStenosis       Waveform Comments  +---------------+--------+---------------+---------+--------+  Prox to Stent  216                    triphasic          +---------------+--------+---------------+---------+--------+  Proximal Stent 303     50-99% stenosisbiphasic           +---------------+--------+---------------+---------+--------+  Mid Stent      141                    biphasic           +---------------+--------+---------------+---------+--------+  Distal Stent   147                    biphasic           +---------------+--------+---------------+---------+--------+  Distal to Stent110                    biphasic           +---------------+--------+---------------+---------+--------+    +-------+-----------+-----------+------------+------------+  ABI/TBIToday's ABIToday's TBIPrevious ABIPrevious TBI  +-------+-----------+-----------+------------+------------+  Right 0.98       amputated  0.83        amputated     +-------+-----------+-----------+------------+------------+  Left  0.62       amputated  0.68        amputated     +-------+-----------+-----------+------------+------------+   ASSESSMENT/PLAN:: 67 y.o. male here for follow up status post right BK pop-PT bypass, right lower extremity angiogram for assisted bypass patency-retained valve lysis, left lower extremity SFA pop stenting, native posterior tibial artery angioplasty.  Relatively unchanged.   Serial duplex ultrasound demonstrated stable, elevated velocities at the left proximal SFA.  This has been imaged in the past with angiography demonstrating no issues.   The more concerning finding is right-sided superficial femoral artery stenosis with velocities near 400.  Although this is negative, I am concerned that this is threatening the right lower extremity bypass.  After discussing the risk and benefits of right lower extremity angiography in an effort to define, improve inflow for assisted primary patency of his bypass, Keegin elected to proceed.    I asked that he continue his current medication regimen.  [name],  [facility] [phone]

## 2022-09-06 NOTE — Progress Notes (Signed)
[name] ([id number]) [id number] Page 1 of 9 Visit Report for 09/03/2022 Arrival Information Details Patient Name: Date of Service: [name], [name]. 09/03/2022 10:15 [name] Medical Record Number: [id number] Patient Account Number: [id number] Date of Birth/Sex: Treating RN: [birth year][name] (67 y.o. [name] Primary Care Herschell Virani: [name] Other Clinician: Referring Kei Mcelhiney: Treating Keyshon Stein/Extender: [name], [name] in Treatment: 20 Visit Information History Since Last Visit Added or deleted any medications: No Patient Arrived: Wheel Chair Any new allergies or adverse reactions: No Arrival Time: 10:59 Had [name] fall or experienced change in No Accompanied By: self activities of daily living that may affect Transfer Assistance: Manual risk of falls: Patient Identification Verified: Yes Signs or symptoms of abuse/neglect since last visito No Secondary Verification Process Completed: Yes Hospitalized since last visit: No Patient Requires Transmission-Based Precautions: No Implantable device outside of the clinic excluding No Patient Has Alerts: Yes cellular tissue based products placed in the center since last visit: Has Dressing in Place as Prescribed: Yes Pain Present Now: No Electronic Signature(s) Signed: 09/03/2022 5:16:11 PM By: [name] RN, [name] Entered By: [name] on 09/03/2022 10:59:32 -------------------------------------------------------------------------------- Encounter Discharge Information Details Patient Name: Date of Service: [name], NA [name]. 09/03/2022 10:15 [name] Medical Record Number: [id number] Patient Account Number: [id number] Date of Birth/Sex: Treating RN: [name] (67 y.o. [name] Primary Care Terie Lear: [name] Other Clinician: Referring Marylan Glore: Treating Dallyn Bergland/Extender: [name], [name] in Treatment: 20 Encounter Discharge Information  Items Post Procedure Vitals Discharge Condition: Stable Temperature (F): 97.5 Ambulatory Status: Wheelchair Pulse (bpm): 75 Discharge Destination: Home Respiratory Rate (breaths/min): 16 Transportation: Private Auto Blood Pressure (mmHg): 154/86 Accompanied By: self Schedule Follow-up Appointment: Yes Clinical Summary of Care: Electronic Signature(s) Signed: 09/03/2022 5:16:11 PM By: [name] RN, [name] Entered By: [name] on 09/03/2022 11:35:20 -------------------------------------------------------------------------------- Lower Extremity Assessment Details Patient Name: Date of Service: [name]. 09/03/2022 10:15 [name] Medical Record Number: [id number] Patient Account Number: [id number] Date of Birth/Sex: Treating RN: [birth year][name] (67 y.o. [name] Primary Care Tarick Parenteau: [name] Other Clinician: Referring Kahleb Mcclane: Treating [name]/Extender: [name], [name] in Treatment: 20 Edema Assessment Assessed: [name]: Yes] [name]: Yes] T[LeftJanie [name] [id number] [[id number] Page 2 of 9] Edema: [Left: Yes] [Right: Yes] Calf Left: Right: Point of Measurement: 36 cm From Medial Instep 37 cm 40 cm Ankle Left: Right: Point of Measurement: 9 cm From Medial Instep 22.5 cm 22 cm Electronic Signature(s) Signed: 09/03/2022 5:16:11 PM By: [name] RN, [name] Entered By: [name] on 09/03/2022 11:01:11 -------------------------------------------------------------------------------- Multi Wound Chart Details Patient Name: Date of Service: [name], NA [name]. 09/03/2022 10:15 [name] Medical Record Number: [id number] Patient Account Number: [id number] Date of Birth/Sex: Treating RN: [birth year][name] (67 y.o. M) Primary Care Finnean Cerami: [name] Other Clinician: Referring Aymee Fomby: Treating Shundra Wirsing/Extender: [name], [name] in Treatment: 20 Vital Signs Height(in):  72 Capillary Blood Glucose(mg/dl): 137 Weight(lbs): 220 Pulse(bpm): 11 Body Mass Index(BMI): 29.8 Blood Pressure(mmHg): 154/86 Temperature(F): 97.5 Respiratory Rate(breaths/min): 16 [5:Photos: No Photos] [[name]:[name]] Left, Lateral Foot Right Amputation Site - [name] Wound Location: Transmetatarsal Surgical Injury Gradually Appeared [name] Wounding Event: Diabetic Wound/Ulcer of the Lower Diabetic Wound/Ulcer of the Lower [name] Primary Etiology: Extremity Extremity Open Surgical Wound [name] [name] Secondary Etiology: Asthma, Sleep Apnea, Hypertension, Asthma, Sleep Apnea, Hypertension, [name] Comorbid History: Peripheral Venous Disease, Type II Peripheral Venous Disease, Type II Diabetes, Osteomyelitis, Neuropathy Diabetes, Osteomyelitis,  Neuropathy 12/20/2021 07/16/2022 [name] Date Acquired: 72 7 [name] Weeks of Treatment: Open Open [name] Wound Status: No No [name] Wound Recurrence: 1x0.3x0.2 0.2x0.2x0.5 [name] Measurements L x W x D (cm) 0.236 0.031 [name] [name] (cm) : rea 0.047 0.016 [name] Volume (cm) : 97.60% 96.80% [name] % Reduction in [name] rea: 99.10% 98.40% [name] % Reduction in Volume: Grade 3 Grade 2 [name] Classification: Medium Medium [name] Exudate [name] mount: Serosanguineous Serosanguineous [name] Exudate Type: red, brown red, brown [name] Exudate Color: Epibole Distinct, outline attached [name] Wound Margin: Large (67-100%) Large (67-100%) [name] Granulation [name] mount: Red, Pink Red [name] Granulation Quality: Small (1-33%) None Present (0%) [name] Necrotic [name] mount: Fat Layer (Subcutaneous Tissue): Yes Fat Layer (Subcutaneous Tissue): Yes [name] Exposed Structures: Fascia: No Fascia: No Tendon: No Tendon: No Muscle: No Muscle: No Joint: No Joint: No Bone: No Bone: No Large (67-100%) None [name] EpithelializationMELBIN, [name] ([id number] Page 3 of 9 Debridement - Selective/Open Wound Debridement - Excisional [name] Debridement: 11:15 11:15 [name] Pre-procedure  Verification/Time Out Taken: Lidocaine 4% T opical Solution Lidocaine 4% Topical Solution [name] Pain Control: Callus, Slough Callus, Subcutaneous, Slough [name] Tissue Debrided: Non-Viable Tissue Skin/Subcutaneous Tissue [name] Level: 4 1 [name] Debridement [name] (sq cm): rea Curette Curette [name] Instrument: None None [name] Bleeding: 0 0 [name] Procedural Pain: 0 0 [name] Post Procedural Pain: Procedure was tolerated well Procedure was tolerated well [name] Debridement Treatment Response: 1x0.3x0.2 0.2x0.2x0.4 [name] Post Debridement Measurements L x W x D (cm) 0.047 0.013 [name] Post Debridement Volume: (cm) Excoriation: No Callus: Yes [name] Periwound Skin Texture: Induration: No Excoriation: No Callus: No Induration: No Crepitus: No Crepitus: No Rash: No Rash: No Scarring: No Scarring: No Maceration: No Dry/Scaly: Yes [name] Periwound Skin Moisture: Dry/Scaly: No Maceration: No Atrophie Blanche: No Atrophie Blanche: No [name] Periwound Skin Color: Cyanosis: No Cyanosis: No Ecchymosis: No Ecchymosis: No Erythema: No Erythema: No Hemosiderin Staining: No Hemosiderin Staining: No Mottled: No Mottled: No Pallor: No Pallor: No Rubor: No Rubor: No No Abnormality [name] [name] Temperature: Cellular or Tissue Based Product Cellular or Tissue Based Product [name] Procedures Performed: Debridement Debridement Treatment Notes Wound #5 (Foot) Wound Laterality: Left, Lateral Cleanser Wound Cleanser Discharge Instruction: Cleanse the wound with wound cleanser prior to applying [name] clean dressing using gauze sponges, not tissue or cotton balls. Peri-Wound Care Skin Prep Discharge Instruction: Use skin prep as directed Topical Primary Dressing Grafix #13 Discharge Instruction: applied by Leovardo Thoman. adaptic and steri-strips Discharge Instruction: to secure grafix to wound bed. leave in place. Secondary Dressing ABD Pad, 8x10 Discharge Instruction: Apply over primary dressing as directed. Secured  With [employer], 4.5x3.1 (in/yd) Discharge Instruction: Secure with Kerlix as directed. 74M Medipore H Soft Cloth Surgical T ape, 4 x 10 (in/yd) Discharge Instruction: Secure with tape as directed. Compression Wrap Compression Stockings Add-Ons Wound #6 (Amputation Site - Transmetatarsal) Wound Laterality: Right Cleanser Wound Cleanser Discharge Instruction: Cleanse the wound with wound cleanser prior to applying [name] clean dressing using gauze sponges, not tissue or cotton balls. Peri-Wound Care Skin Prep ELAZAR, BETHKE [name] ([id number]) [id number] Page 4 of 9 Discharge Instruction: Use skin prep as directed Topical Primary Dressing Grafix #4 Discharge Instruction: applied by Harry Shuck. adaptic and steri-strips Discharge Instruction: to secure grafix to wound bed. leave in place. cutimed sorbact Discharge Instruction: lightly pack into wound bed behind the grafix. Secondary Dressing ABD Pad, 8x10 Discharge Instruction: Apply over primary dressing as directed. Secured With [employer], 4.5x3.1 (in/yd) Discharge Instruction: Secure with Kerlix as directed.  55M Medipore H Soft Cloth Surgical T ape, 4 x 10 (in/yd) Discharge Instruction: Secure with tape as directed. Compression Wrap Compression Stockings Add-Ons Electronic Signature(s) Signed: 09/03/2022 12:51:55 PM By: [name] DO Entered By: [name] on 09/03/2022 11:49:10 -------------------------------------------------------------------------------- Multi-Disciplinary Care Plan Details Patient Name: Date of Service: [name], NA [name]. 09/03/2022 10:15 [name] Medical Record Number: [id number] Patient Account Number: [id number] Date of Birth/Sex: Treating RN: [name] (67 y.o. [name] Primary Care Rei Contee: [name] Other Clinician: Referring Penny Arrambide: Treating Astin Rape/Extender: [name], [name] in Treatment: 20 Active  Inactive Osteomyelitis Nursing Diagnoses: Infection: osteomyelitis Goals: Diagnostic evaluation for osteomyelitis completed as ordered Date Initiated: 04/23/2022 Target Resolution Date: 10/02/2022 Goal Status: Active Signs and symptoms for osteomyelitis will be recognized and promptly addressed Date Initiated: 04/23/2022 Target Resolution Date: 09/04/2022 Goal Status: Active Interventions: Assess for signs and symptoms of osteomyelitis resolution every visit Provide education on osteomyelitis Screen for HBO Treatment Activities: Biopsy : 04/16/2022 Consult for HBO : 04/23/2022 Surgical debridement : 04/23/2022 Systemic antibiotics : 04/23/2022 T ordered outside of clinic : 04/23/2022 est [name], [name] ([id number]) [phone].pdf Page 5 of 9 Notes: Wound/Skin Impairment Nursing Diagnoses: Knowledge deficit related to ulceration/compromised skin integrity Goals: Patient/caregiver will verbalize understanding of skin care regimen Date Initiated: 04/14/2022 Target Resolution Date: 10/03/2022 Goal Status: Active Interventions: Assess patient/caregiver ability to perform ulcer/skin care regimen upon admission and as needed Assess ulceration(s) every visit Provide education on ulcer and skin care Notes: Electronic Signature(s) Signed: 09/03/2022 5:16:11 PM By: [name] RN, [name] Entered By: [name] on 09/03/2022 11:20:58 -------------------------------------------------------------------------------- Pain Assessment Details Patient Name: Date of Service: [name], NA [name]. 09/03/2022 10:15 [name] Medical Record Number: [id number] Patient Account Number: [id number] Date of Birth/Sex: Treating RN: [birth year] (67 y.o. [name] Primary Care Catherine Cubero: [name] Other Clinician: Referring Kea Callan: Treating Landrey Mahurin/Extender: [name], [name] in Treatment: 20 Active Problems Location of Pain Severity and Description  of Pain Patient Has Paino No Site Locations Pain Management and Medication Current Pain Management: Notes pain at times per patient neuropathy. Electronic Signature(s) Signed: 09/03/2022 5:16:11 PM By: [name] RN, [name] Entered By: [name] on 09/03/2022 10:59:55 Patient/Caregiver Education Details -------------------------------------------------------------------------------- [name] ([id number]) [id number] Page 6 of 9 Patient Name: Date of Service: [name] [name], [name] [name]. 2/29/2024andnbsp10:15 [name] Medical Record Number: [id number] Patient Account Number: [id number] Date of Birth/Gender: Treating RN: [birth year][name] (67 y.o. [name] Primary Care Physician: [name] Other Clinician: Referring Physician: Treating Physician/Extender: [name] in Treatment: 20 Education Assessment Education Provided To: Patient Education Topics Provided Wound/Skin Impairment: Handouts: Caring for Your Ulcer Methods: Explain/Verbal Responses: Reinforcements needed Electronic Signature(s) Signed: 09/03/2022 5:16:11 PM By: [name] RN, [name] Entered By: [name] on 09/03/2022 11:21:11 -------------------------------------------------------------------------------- Wound Assessment Details Patient Name: Date of Service: [name], NA [name]. 09/03/2022 10:15 [name] Medical Record Number: [id number] Patient Account Number: [id number] Date of Birth/Sex: Treating RN: [birth year] (68 y.o. [name] Primary Care Ramon Brant: [name] Other Clinician: Referring Amare Kontos: Treating Raymond Azure/Extender: [name], [name] in Treatment: 20 Wound Status Wound Number: 5 Primary Diabetic Wound/Ulcer of the Lower Extremity Etiology: Wound Location: Left, Lateral Foot Secondary Open Surgical Wound Wounding Event: Surgical Injury Etiology: Date Acquired: 12/20/2021 Wound Open Weeks Of Treatment:  20 Status: Clustered Wound: No Comorbid Asthma, Sleep Apnea, Hypertension, Peripheral Venous Disease, History: Type II Diabetes, Osteomyelitis, Neuropathy Wound Measurements Length: (cm)  1 Width: (cm) 0.3 Depth: (cm) 0.2 Area: (cm) 0.236 Volume: (cm) 0.047 % Reduction in Area: 97.6% % Reduction in Volume: 99.1% Epithelialization: Large (67-100%) Tunneling: No Undermining: No Wound Description Classification: Grade 3 Wound Margin: Epibole Exudate Amount: Medium Exudate Type: Serosanguineous Exudate Color: red, brown Foul Odor After Cleansing: No Slough/Fibrino Yes Wound Bed Granulation Amount: Large (67-100%) Exposed Structure Granulation Quality: Red, Pink Fascia Exposed: No Necrotic Amount: Small (1-33%) Fat Layer (Subcutaneous Tissue) Exposed: Yes Necrotic Quality: Adherent Slough Tendon Exposed: No Muscle Exposed: No Joint Exposed: No Bone Exposed: No Periwound Skin Texture Texture Color No Abnormalities Noted: No No Abnormalities Noted: Yes Callus: No Temperature / Pain [name], [name] ([id number] Page 7 of 9 Crepitus: No Temperature: No Abnormality Excoriation: No Induration: No Rash: No Scarring: No Moisture No Abnormalities Noted: No Dry / Scaly: No Maceration: No Treatment Notes Wound #5 (Foot) Wound Laterality: Left, Lateral Cleanser Wound Cleanser Discharge Instruction: Cleanse the wound with wound cleanser prior to applying [name] clean dressing using gauze sponges, not tissue or cotton balls. Peri-Wound Care Skin Prep Discharge Instruction: Use skin prep as directed Topical Primary Dressing Grafix #13 Discharge Instruction: applied by Tilford Deaton. adaptic and steri-strips Discharge Instruction: to secure grafix to wound bed. leave in place. Secondary Dressing ABD Pad, 8x10 Discharge Instruction: Apply over primary dressing as directed. Secured With [employer], 4.5x3.1 (in/yd) Discharge  Instruction: Secure with Kerlix as directed. 77M Medipore H Soft Cloth Surgical T ape, 4 x 10 (in/yd) Discharge Instruction: Secure with tape as directed. Compression Wrap Compression Stockings Add-Ons Electronic Signature(s) Signed: 09/03/2022 5:16:11 PM By: [name] RN, [name] Entered By: [name] on 09/03/2022 11:11:56 -------------------------------------------------------------------------------- Wound Assessment Details Patient Name: Date of Service: [name], NA [name]. 09/03/2022 10:15 [name] Medical Record Number: [id number] Patient Account Number: [id number] Date of Birth/Sex: Treating RN: [birth year][name] (67 y.o. 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 Wound Open Wounding Event: Gradually Appeared Status: Date Acquired: 07/16/2022 Comorbid Asthma, Sleep Apnea, Hypertension, Peripheral Venous Disease, Weeks Of Treatment: 7 History: Type II Diabetes, Osteomyelitis, Neuropathy Clustered Wound: No Photos [name], [name] ([id number]) [phone].pdf Page 8 of 9 Wound Measurements Length: (cm) 0.2 Width: (cm) 0.2 Depth: (cm) 0.5 Area: (cm) 0.031 Volume: (cm) 0.016 % Reduction in Area: 96.8% % Reduction in Volume: 98.4% Epithelialization: None Tunneling: No Undermining: No Wound Description Classification: Grade 2 Wound Margin: Distinct, outline attached Exudate Amount: Medium Exudate Type: Serosanguineous Exudate Color: red, brown Foul Odor After Cleansing: No Slough/Fibrino Yes Wound Bed Granulation Amount: Large (67-100%) Exposed Structure Granulation Quality: Red Fascia Exposed: No Necrotic Amount: None Present (0%) Fat Layer (Subcutaneous Tissue) Exposed:  Yes Tendon Exposed: No Muscle Exposed: No Joint Exposed: No Bone Exposed: No Periwound Skin Texture Texture Color No Abnormalities Noted: No No Abnormalities Noted: No Callus: Yes Atrophie Blanche: No Crepitus: No Cyanosis: No Excoriation: No Ecchymosis: No Induration: No Erythema: No Rash: No Hemosiderin Staining: No Scarring: No Mottled: No Pallor: No Moisture Rubor: No No Abnormalities Noted: No Dry / Scaly: Yes Maceration: No Treatment Notes Wound #6 (Amputation Site - Transmetatarsal) Wound Laterality: Right Cleanser Wound Cleanser Discharge Instruction: Cleanse the wound with wound cleanser prior to applying [name] clean dressing using gauze sponges, not tissue or cotton balls. Peri-Wound Care Skin Prep Discharge Instruction: Use skin  prep as directed Topical Primary Dressing Grafix #4 Discharge Instruction: applied by Cyndie Woodbeck. adaptic and steri-strips Discharge Instruction: to secure grafix to wound bed. leave in place. cutimed sorbact Discharge Instruction: lightly pack into wound bed behind the grafix. Secondary Dressing [name], [name] ([id number]) [id number] Page 9 of 9 ABD Pad, 8x10 Discharge Instruction: Apply over primary dressing as directed. Secured With [employer], 4.5x3.1 (in/yd) Discharge Instruction: Secure with Kerlix as directed. 62M Medipore H Soft Cloth Surgical T ape, 4 x 10 (in/yd) Discharge Instruction: Secure with tape as directed. Compression Wrap Compression Stockings Add-Ons Electronic Signature(s) Signed: 09/03/2022 5:16:11 PM By: [name] RN, [name] Entered By: [name] on 09/03/2022 11:11:36 -------------------------------------------------------------------------------- Vitals Details Patient Name: Date of Service: [name], NA [name]. 09/03/2022 10:15 [name] Medical Record Number: [id number] Patient Account Number: [id number] Date of Birth/Sex: Treating RN: [name][birth year] (67 y.o.  M) Primary Care Lorrin Nawrot: [name] Other Clinician: Referring Estelene Carmack: Treating Dalissa Lovin/Extender: [name], [name] in Treatment: 20 Vital Signs Time Taken: 10:12 Temperature (F): 97.5 Height (in): 72 Pulse (bpm): 75 Weight (lbs): 220 Respiratory Rate (breaths/min): 16 Body Mass Index (BMI): 29.8 Blood Pressure (mmHg): 154/86 Capillary Blood Glucose (mg/dl): 137 Reference Range: 80 - 120 mg / dl Electronic Signature(s) Signed: 09/03/2022 10:48:24 AM By: [name] CHT EMT BS , , Entered By: [name] on 09/03/2022 10:48:23

## 2022-09-06 NOTE — Progress Notes (Signed)
[name] [name] ([id number]) [id number] Page 1 of 14 Visit Report for 09/03/2022 Chief Complaint Document Details Patient Name: Date of Service: [name], [name]. 09/03/2022 10:15 [name] Medical Record Number: [id number] Patient Account Number: [id number] Date of Birth/Sex: Treating RN: Jan 30, 1956 (67 y.o. M) Primary Care Provider: Roselee [name] Other Clinician: Referring Provider: Treating Provider/Extender: [name], [name] in Treatment: 20 Information Obtained from: Patient Chief Complaint 10/16/20; patient is here with [name] substantial wound on his right foot in the setting of [name] recent transmetatarsal amputation. 04/14/2022; referral from podiatry for postsurgical wound of nonhealing partial left fifth ray amputation, right foot (07/16/22): TMA dehiscence Electronic Signature(s) Signed: 09/03/2022 12:51:55 PM By: [name] Shan DO Entered By: [name] on 09/03/2022 11:49:17 -------------------------------------------------------------------------------- Cellular or Tissue Based Product Details Patient Name: Date of Service: [name], NA [name] [name]. 09/03/2022 10:15 [name] Medical Record Number: [id number] Patient Account Number: [id number] Date of Birth/Sex: Treating RN: Mar 25, 1956 (67 y.o. [name] Primary Care Provider: Roselee [name] Other Clinician: Referring Provider: Treating Provider/Extender: [name] in Treatment: 20 Cellular or Tissue Based Product Type Wound #6 Right Amputation Site - Transmetatarsal Applied to: Performed By: Physician [name] Shan, DO Cellular or Tissue Based Product Type: Grafix prime Level of Consciousness (Pre-procedure): Awake and Alert Pre-procedure Verification/Time Out Yes - 11:25 Taken: Location: genitalia / hands / feet / multiple digits Wound Size (sq cm): 0.04 Product Size (sq cm): 1 Waste Size (sq cm): 1 Waste Reason: wound size Amount of Product  Applied (sq cm): 0 Instrument Used: Forceps, Scissors Lot #: [id number] Order #: 4 Expiration Date: 09/30/2023 Fenestrated: No Reconstituted: Yes Solution Type: normal saline Solution Amount: 37m Lot #: [id number] Expiration Date: 12/03/2024 Secured: Yes Secured With: Steri-Strips, adaptic Dressing Applied: Yes Primary Dressing: CUTIMED SORBACT UNDER ADAPTIC Procedural Pain: 0 Post Procedural Pain: 0 Response to Treatment: Procedure was tolerated well Level of Consciousness (Post- Awake and Alert procedure): Post Procedure Diagnosis Same as Pre-procedure Electronic Signature(s) TBRODERIC, CULLIMOREA ([id number] Page 2 of 14 Signed: 09/03/2022 12:51:55 PM By: HKalman ShanDO Signed: 09/03/2022 5:16:11 PM By: DDeon PillingRN, [name] Entered By: DDeon Pillingon 09/03/2022 11:29:55 -------------------------------------------------------------------------------- Cellular or Tissue Based Product Details Patient Name: Date of Service: [name] NA [name] [name]. 09/03/2022 10:15 [name] Medical Record Number: [id number] Account Number: [id number] of Birth/Sex: Treating RN: [birth year](67y.o. MHessie DienerPrimary Care Provider: BRoselee NovaOther Clinician: Referring Provider: Treating Provider/Extender: HBurgess Amorin Treatment: 20 Cellular or Tissue Based Product Type Wound #5 Left,Lateral Foot Applied to: Performed By: Physician HKalman Shan DO Cellular or Tissue Based Product Type: Grafix prime Level of Consciousness (Pre-procedure): Awake and Alert Pre-procedure Verification/Time Out Yes - 11:25 Taken: Location: genitalia / hands / feet / multiple digits Wound Size (sq cm): 0.3 Product Size (sq cm): 1 Waste Size (sq cm): 0 Amount of Product Applied (sq cm): 1 Instrument Used: Forceps, Scissors Lot #: [id number] #: 4 Expiration Date: 09/30/2023 Fenestrated: No Reconstituted:  Yes Solution Type: normal saline Solution Amount: 112mLot #: [id number] Expiration Date: 12/03/2024 Secured: Yes Secured With: Steri-Strips, adaptic Dressing Applied: Yes Primary Dressing: CUTIMED SORBACT UNDER ADAPTIC Procedural Pain: 0 Post Procedural Pain: 0 Response to Treatment: Procedure was tolerated well Level of Consciousness (Post- Awake and Alert procedure): Post Procedure Diagnosis Same as Pre-procedure Electronic Signature(s) Signed: 09/03/2022 12:51:55 PM By: HoKalman ShanO Signed: 09/03/2022 5:16:11 PM By: DeRolin Barry  [name] RN, [name] Entered By: [name] on 09/03/2022 11:30:16 -------------------------------------------------------------------------------- Debridement Details Patient Name: Date of Service: [name], [name] [name] [name]. 09/03/2022 10:15 [name] Medical Record Number: [id number] Patient Account Number: [id number] Date of Birth/Sex: Treating RN: 1955-09-08 (67 y.o. [name] Primary Care Provider: Roselee [name] Other Clinician: Referring Provider: Treating Provider/Extender: [name], [name] in Treatment: 20 Debridement Performed for Assessment: Wound #5 Left,Lateral Foot Performed By: Physician [name] Shan, DO Debridement Type: Debridement Severity of Tissue Pre Debridement: Fat layer exposed Level of Consciousness (Pre-procedure): Awake and Alert Pre-procedure Verification/Time Out Yes - 11:15 Taken: Start Time: 11:16 Pain Control: Lidocaine 4% Topical Solution [name], [name] ([id number]) [phone].pdf Page 3 of 14 T Area Debrided (L x W): otal 4 (cm) x 1 (cm) = 4 (cm) Tissue and other material debrided: Non-Viable, Callus, Slough, Slough Level: Non-Viable Tissue Debridement Description: Selective/Open Wound Instrument: Curette Bleeding: None End Time: 11:20 Procedural Pain: 0 Post Procedural Pain: 0 Response to Treatment: Procedure was tolerated well Level of Consciousness  (Post- Awake and Alert procedure): Post Debridement Measurements of Total Wound Length: (cm) 1 Width: (cm) 0.3 Depth: (cm) 0.2 Volume: (cm) 0.047 Character of Wound/Ulcer Post Debridement: Improved Severity of Tissue Post Debridement: Fat layer exposed Post Procedure Diagnosis Same as Pre-procedure Electronic Signature(s) Signed: 09/03/2022 12:51:55 PM By: [name] Shan DO Signed: 09/03/2022 5:16:11 PM By: [name] RN, [name] Entered By: [name] on 09/03/2022 11:26:21 -------------------------------------------------------------------------------- Debridement Details Patient Name: Date of Service: [name], NA [name] [name]. 09/03/2022 10:15 [name] Medical Record Number: [id number] Patient Account Number: [id number] Date of Birth/Sex: Treating RN: 06-Oct-1955 (67 y.o. [name], [name] Primary Care Provider: Roselee [name] Other Clinician: Referring Provider: Treating Provider/Extender: [name], [name] in Treatment: 20 Debridement Performed for Assessment: Wound #6 Right Amputation Site - Transmetatarsal Performed By: Physician [name] Shan, DO Debridement Type: Debridement Severity of Tissue Pre Debridement: Fat layer exposed Level of Consciousness (Pre-procedure): Awake and Alert Pre-procedure Verification/Time Out Yes - 11:15 Taken: Start Time: 11:16 Pain Control: Lidocaine 4% T opical Solution T Area Debrided (L x W): otal 1 (cm) x 1 (cm) = 1 (cm) Tissue and other material debrided: Viable, Non-Viable, Callus, Slough, Subcutaneous, Skin: Dermis , Skin: Epidermis, Slough Level: Skin/Subcutaneous Tissue Debridement Description: Excisional Instrument: Curette Bleeding: None End Time: 11:20 Procedural Pain: 0 Post Procedural Pain: 0 Response to Treatment: Procedure was tolerated well Level of Consciousness (Post- Awake and Alert procedure): Post Debridement Measurements of Total Wound Length: (cm) 0.2 Width: (cm) 0.2 Depth: (cm) 0.4 Volume:  (cm) 0.013 Character of Wound/Ulcer Post Debridement: Improved Severity of Tissue Post Debridement: Fat layer exposed Post Procedure Diagnosis Same as Pre-procedure Electronic Signature(s) [name], [name] ([id number]) [phone].pdf Page 4 of 14 Signed: 09/03/2022 12:51:55 PM By: [name] Shan DO Signed: 09/03/2022 5:16:11 PM By: [name] RN, [name] Entered By: [name] on 09/03/2022 11:27:38 -------------------------------------------------------------------------------- HPI Details Patient Name: Date of Service: [name], NA [name] [name]. 09/03/2022 10:15 [name] Medical Record Number: [id number] Patient Account Number: [id number] Date of Birth/Sex: Treating RN: 1955/10/31 (67 y.o. M) Primary Care Provider: Roselee [name] Other Clinician: Referring Provider: Treating Provider/Extender: [name], [name] in Treatment: 20 History of Present Illness HPI Description: ADMISSION 10/16/20 This is [name] 67 year old man who is [name] type II diabetic. Initially seen by Dr. Unk Lightning of vein and vascular on 09/10/2021 for bilateral lower extremity rest pain right greater than left. His ABIs demonstrated monophasic waveforms at the ankles bilaterally with  depressed toe pressures. His ABI in the right was 0.5 and on the left 0.28. There were no obtainable waveforms for TBI's. He underwent angiography on 09/10/2021 and had findings of severe PAD with 95% stenosis of the distal SFA. He also had peroneal and posterior tibial arteries occluded with significant collateralization in the calf there was reconstitution of the posterior tibial artery in the distal third of the calf. Ostia of the anterior tibial artery was not appreciated. He underwent [name] angioplasty of the superficial femoral artery. He required admission to hospital from 09/12/2021 through 09/22/2021 with right foot cellulitis and sepsis. On 3/13 he underwent [name] right below-knee popliteal to posterior tibial bypass  using [name] greater saphenous vein. Ultimately however he required [name] right TMA by podiatry which I believe was done on 3/17 for progressive diabetic foot infection. He was discharged to [facility] skilled facility. He arrives in clinic today with [name] large wound area over the entirety of his amputation site extending proximally. The attempted flap closure of the amputation site and his TMA has failed and the wound extends under the attempt to closure. Still some sutures in place. There is an odor but he is not systemically unwell. He is due for follow-up arterial studies and has an appointment with vascular surgery on 10/28/2021. They are using wet-to-dry dressings at [facility]. Past medical history includes type 2 diabetes with peripheral neuropathy, hypertension, obstructive sleep apnea 4/20; patient presents for follow-up. He is being discharged from his facility at [facility] today. They have been doing Dakin's wet-to-dry dressings. He states that his fiance can help with dressing changes. He is getting [facility] home [facility] to come out 3 times [name] week once he leaves the facility. He is scheduled to see vein and vascular on 5/12. He has not seen the wound himself. He puts covers over his face for wound exams. He currently denies systemic signs of infection. 4/28; patient admitted to the clinic 2 [name] ago with [name] dehisced right TMA in the setting of type 2 diabetes with significant PAD. He is status post revascularization. He has been discharged from the nursing home he was at and now is at home using Dakin's wet-to-dry dressings daily he has home health. He denies any systemic signs of infection 5/4; patient presents for follow-up. His fiance and home health have been changing his dressings. He currently denies systemic signs of infection. He continues to put sheet covers over his face during our wound encounters because he does not want [name] look at the wound. 5/25; Patient presents for follow-up. He missed his  last clinic appointment. He had [name] bone biopsy and culture done at last clinic visit that showed acute osteomyelitis with growth of E. coli, stenotrophomonas maltophilia and enteroccocus faecalis. [name] referral to infectious disease was made. He has not heard from them yet. He has been using Dakin's wet-to-dry dressings. He is now more comfortable with looking at the wound bed. 6/6; the patient comes into clinic today with 2 wounds on the left foot which apparently have been there for several months. This was not known to assess but was known by Dr. Donzetta Matters. In fact he underwent an angiogram yesterday. He had [name] laser atherectomy of the left posterior tibial artery with [name] 3 mm balloon angioplasty, also [name] stent of the last left SFA. 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 This is [name] lot better than the last time I saw it. He has been using Dakin's wet-to-dry. He has an appointment with infectious disease for [name] bone biopsy which showed E. coli, stenotrophomonas and Enterococcus faecalis. The appointment is for tomorrow 04/14/2022 Mr. Darrly Eckenroth ajuddin is an uncontrolled type 2 diabetic on insulin that presents with [name] left foot ulcer. On 12/18/2021 he required [name] left fifth ray amputation. He had revision of this site on 6/17 by Dr. Posey Pronto, podiatry. He states he has had [name] wound VAC on this area for the past 3 [name]. It is unclear what the prior wound care has been. He has [name] history of osteomyelitis to this area and was treated with IV and oral antibiotics For 6 [name] by Dr. Linus Salmons of infectious disease. At his last follow-up on 02/20/2022 his CRP was trending down and antibiotics were stopped. He also has [name] history of peripheral arterial disease status post stenting to the left SFA and balloon angioplasty to the left posterior tibial artery On 12/2021. He has  follow-up with vein and vascular at the end of the month. He also has [name] wound to the dorsal aspect of the left foot that he has been placing Betadine on. 10/19; patient presents for follow-up. He had [name] bone biopsy of the left foot at last clinic visit showed osteomyelitis. The bone culture showed Enterobacter cloacae, Enterococcus faecalis, Staphylococcus aureus, viridans group strep, actinotigum schaelii. He has been referred to infectious disease And has an appointment on October 25. He has been using Dakin's wet-to-dry dressing to the lateral left foot wound and Hydrofera Blue and Medihoney to the dorsal foot wound. He has no issues or complaints today. He denies systemic signs of infection. 10/26; patient presents for follow-up. He saw Dr. Linus Salmons yesterday with infectious disease and has been prescribed doxycycline and Levaquin for his chronic osteomyelitis. He has been using Dakin's wet-to-dry dressings to the lateral left foot wound and Medihoney and Hydrofera Blue to the dorsal wound. We discussed [name] skin graft for the dorsal foot wound and he would like to proceed with insurance verification. 11/3; patient with [name] wound on the dorsal left foot and [name] substantial postsurgical area on the lateral left foot fifth ray amputation. He is also followed by Dr. Alton Revere of infectious disease. He had [name] bone biopsy from the left lateral foot that showed acute osteomyelitis. Swab culture showed multiple organisms. He has also been revascularized by infectious disease status post left SFA stenting and tibial laser atherectomy and angioplasty. An MRI of the left foot had previously shown osteomyelitis of the fifth met head we have been using Dakin's wet-to-dry to the left lateral foot and Medihoney and Hydrofera Blue to the wound on the dorsal foot. 11/13; HBO treatment was discussed at last clinic visit and patient would like to proceed with this. He has orientation today. He has been approved for Grafix. We have  been using Hydrofera Blue and Medihoney to the left dorsal foot and collagen to the left lateral foot. Patient has home health that comes in for dressing changes. He currently has no issues or complaints today. [name], [name] ([id number]) [id number] Page 5 of 14 12/7; patient missed his last clinic appointment. He was last seen 3 [name] ago. He is currently off of antibiotics per ID. He completed [name] prolonged treatment. He has been using collagen to the lateral foot wound. He has been using Medihoney and Hydrofera Blue to the dorsal foot  wound. He has been doing well with hyperbaric oxygen therapy. He has no issues or complaints today. He denies signs of of infection. 12/14; patient presents for follow-up. We have been using Grafix to the wound beds. He has had trouble equalizing the pressure in his ears during HBO treatments. It was recommended he follow-up with ENT He does not have an appointment yet. He currently denies signs of infection. . 12/21; patient presents for follow-up. He was seen by ENT and had tubes placed. He is continued HBO without issues. We have been using Grafix to the wound beds. There is been improvement in wound healing. 12/21; patient presents for follow-up. He has no issues or complaints today. There is been improvement in wound healing. 1/5; patient presents for follow-up. At last clinic visit Grafix was placed in standard fashion. He has no issues or complaints today. He is tolerating hyperbaric oxygen therapy well. He denies signs of infection. 1/11; patient presents for follow-up. We have been placing Grafix to the wound beds on the left foot. The dorsal left foot wound has healed. The left lateral foot wound has shown improvement in healing. Unfortunately has had dehiscence of his previous surgical wound site on the right foot. He currently denies signs of infection. 1/19; patient presents for follow-up. Grafix was placed to the left foot  wound at last clinic visit. He has been using Dakin's wet-to-dry dressings to the right foot wound. He continues HBO therapy without issues. He has shown improvement in wound healing to the left foot. 1/26; patient presents for follow-up. We been placing Grafix to the left foot wound and patient has been using Dakin's wet-to-dry dressings to the right foot wound. There is been improvement in wound healing. He continues HBO therapy without issues. 2/2; patient presents for follow-up. We have been using Grafix to the left foot wound and Dakin's wet-to-dry dressings to the right foot wound. He has no issues or complaints today. 2/8; patient presents for follow-up. We have been using Grafix to the left foot wound and Dakin's wet-to-dry dressings to the right foot wound. He has no issues or complaints today. He continues HBO without issues. 2/15; patient presents for follow-up. We have been using Grafix to the wound beds. He continues HBO without issues. He has been offloading the wound beds with his surgical shoe. 2/22; patient presents for follow-up. We have been using Grafix to the wound beds. He has no issues or complaints today. He continues HBO therapy without issues. 2/29; patient presents for follow-up. We have been using Grafix to the wound beds. There is been improvement in wound healing. He has no issues or complaints today. He continues HBO therapy. Electronic Signature(s) Signed: 09/03/2022 12:51:55 PM By: [name] Shan DO Entered By: [name] on 09/03/2022 11:49:38 -------------------------------------------------------------------------------- Physical Exam Details Patient Name: Date of Service: [name], NA [name] [name]. 09/03/2022 10:15 [name] Medical Record Number: [id number] Patient Account Number: [id number] Date of Birth/Sex: Treating RN: 14-Apr-1956 (67 y.o. M) Primary Care Provider: Roselee [name] Other Clinician: Referring Provider: Treating Provider/Extender: [name], [name] in Treatment: 20 Constitutional respirations regular, non-labored and within target range for patient.. Cardiovascular 2+ dorsalis pedis/posterior tibialis pulses. Psychiatric pleasant and cooperative. Notes Left foot: T the lateral aspect there is an open wound with granulation tissue and nonviable tissue. No probing to bone . Right foot: Along the previous TMA o site there is an open wound with nonviable tissue, granulation tissue and callus. Does not probe to bone. No  signs of surrounding soft tissue infection. Electronic Signature(s) Signed: 09/03/2022 12:51:55 PM By: [name] Shan DO Entered By: [name] on 09/03/2022 12:35:33 -------------------------------------------------------------------------------- Physician Orders Details Patient Name: Date of Service: [name], [name] [name] [name]. 09/03/2022 10:15 [name] Cyndie Chime [name] ([id number] Page 6 of 14 Medical Record Number: [id number] Patient Account Number: [id number] Date of Birth/Sex: Treating RN: 07-24-1955 (67 y.o. [name], [name] Primary Care Provider: Roselee [name] Other Clinician: Referring Provider: Treating Provider/Extender: [name], [name] in Treatment: 20 Verbal / Phone Orders: No Diagnosis Coding ICD-10 Coding Code Description [phone] Non-pressure chronic ulcer of other part of left foot with fat layer exposed M86.172 Other acute osteomyelitis, left ankle and foot L97.528 Non-pressure chronic ulcer of other part of left foot with other specified severity E11.621 Type 2 diabetes mellitus with foot ulcer E11.42 Type 2 diabetes mellitus with diabetic polyneuropathy S91.301A Unspecified open wound, right foot, initial encounter 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 severity Follow-up Appointments ppointment in 1 week. - Dr. Heber [place]wood Thursday 1100 09/10/2022 room 8 see after  hyperbaric. Return [name] ppointment in 2 [name]. - Dr. Heber [employer] Thursday 09/17/2022 ROOM 8 1015 AFTER HYPERBARIC Return [name] Other: - ****ONLY CHANGE OUTTER DRESSINGS ONLY.*** Anesthetic (In clinic) Topical Lidocaine 5% applied to wound bed Cellular or Tissue Based Products Cellular or Tissue Based Product Type: - Run IVR for Grafix for left dorsal foot- pending Grafix # 1 applied 06/11/22 Grafix # 2 applied 06/18/22 Grafix # 3 applied 06/25/22 GRafix # 4 applied 07/02/22 Grafix # 5 applied 07/10/22 Grafix # 6 applied 07/17/22 Grafix # 7 applied 07/24/22---Also, go ahead and run IVR for more Grafix for left lateral foot Grafix #8 applied 07/31/2022 Grafix # 9 applied 08/07/22] left foot Grafix #10 applied 08/13/2022 left and right foot wounds today split the grafix in half. Graftx #11 applied to both wounds. Patient right foot approved for total of 12 between dates 08/18/2022-11/04/2022. Grafix #12 applied to both wounds. grafix #4 right foot split grafix and use to left foot #13. total can have 24. Cellular or Tissue Based Product applied to wound bed, secured with steri-strips, cover with Adaptic or Mepitel. (DO NOT REMOVE). Edema Control - Lymphedema / SCD / Other Avoid standing for long periods of time. Off-Loading Open toe surgical shoe to: - wear when standing or walking. Hyperbaric Oxygen Therapy Evaluate for HBO Therapy - 07/24/22- Pt. to finish next 8 HBO treatments 07/24/22- DR. Shelle Galdamez wants pt. to be extended for 40 more treatments Indication: - Wagner Grade III to left lateral foot 2.5 ATA for 90 Minutes with 2 Five (5) Minute [name] Breaks ir Total Number of Treatments: - 40; extend for 40 more= 80 treatments One treatments per day (delivered Monday through Friday unless otherwise specified in Special Instructions below): Finger stick Blood Glucose Pre- and Post- HBOT Treatment. Follow Hyperbaric Oxygen Glycemia Protocol Afrin (Oxymetazoline HCL) 0.05% nasal spray - 1 spray in both  nostrils daily as needed prior to HBO treatment for difficulty clearing ears Wound Treatment Wound #5 - Foot Wound Laterality: Left, Lateral Cleanser: Wound Cleanser (Generic) 1 x Per Day/30 Days Discharge Instructions: Cleanse the wound with wound cleanser prior to applying [name] clean dressing using gauze sponges, not tissue or cotton balls. Peri-Wound Care: Skin Prep (Generic) 1 x Per Day/30 Days Discharge Instructions: Use skin prep as directed Prim Dressing: Grafix #13 (Generic) 1 x Per Day/30 Days ary Discharge Instructions: applied by provider. Prim Dressing: adaptic and  steri-strips 1 x Per Day/30 Days ary Discharge Instructions: to secure grafix to wound bed. leave in place. Secondary Dressing: ABD Pad, 8x10 (Generic) 1 x Per Day/30 Days Discharge Instructions: Apply over primary dressing as directed. Secured With: [employer], 4.5x3.1 (in/yd) (Generic) 1 x Per Day/30 Days [name], [name] ([id number]) [id number] Page 7 of 14 Discharge Instructions: Secure with Kerlix as directed. Secured With: 763M Medipore H Soft Cloth Surgical T ape, 4 x 10 (in/yd) (Generic) 1 x Per Day/30 Days Discharge Instructions: Secure with tape as directed. Wound #6 - Amputation Site - Transmetatarsal Wound Laterality: Right Cleanser: Wound Cleanser (Generic) 1 x Per Day/30 Days Discharge Instructions: Cleanse the wound with wound cleanser prior to applying [name] clean dressing using gauze sponges, not tissue or cotton balls. Peri-Wound Care: Skin Prep (Generic) 1 x Per Day/30 Days Discharge Instructions: Use skin prep as directed Prim Dressing: Grafix #4 (Generic) 1 x Per Day/30 Days ary Discharge Instructions: applied by provider. Prim Dressing: adaptic and steri-strips 1 x Per Day/30 Days ary Discharge Instructions: to secure grafix to wound bed. leave in place. Prim Dressing: cutimed sorbact 1 x Per Day/30 Days ary Discharge Instructions: lightly pack into wound bed  behind the grafix. Secondary Dressing: ABD Pad, 8x10 (Generic) 1 x Per Day/30 Days Discharge Instructions: Apply over primary dressing as directed. Secured With: [employer], 4.5x3.1 (in/yd) (Generic) 1 x Per Day/30 Days Discharge Instructions: Secure with Kerlix as directed. Secured With: 763M Medipore H Soft Cloth Surgical T ape, 4 x 10 (in/yd) (Generic) 1 x Per Day/30 Days Discharge Instructions: Secure with tape as directed. GLYCEMIA INTERVENTIONS PROTOCOL PRE-HBO GLYCEMIA INTERVENTIONS ACTION INTERVENTION Obtain pre-HBO capillary blood glucose (ensure 1 physician order is in chart). [name]. Notify HBO physician and await physician orders. 2 If result is 70 mg/dl or below: B. If the result meets the hospital definition of [name] critical result, follow hospital policy. [name]. Give patient an 8 ounce Glucerna Shake, an 8 ounce Ensure, or 8 ounces of [name] Glucerna/Ensure equivalent dietary supplement*. B. Wait 30 minutes. If result is 71 mg/dl to 130 mg/dl: C. Retest patients capillary blood glucose (CBG). D. If result greater than or equal to 110 mg/dl, proceed with HBO. If result less than 110 mg/dl, notify HBO physician and consider holding HBO. If result is 131 mg/dl to 249 mg/dl: [name]. Proceed with HBO. [name]. Notify HBO physician and await physician orders. B. It is recommended to hold HBO and do If result is 250 mg/dl or greater: blood/urine ketone testing. C. If the result meets the hospital definition of [name] critical result, follow hospital policy. POST-HBO GLYCEMIA INTERVENTIONS ACTION INTERVENTION Obtain post HBO capillary blood glucose (ensure 1 physician order is in chart). [name]. Notify HBO physician and await physician orders. 2 If result is 70 mg/dl or below: B. If the result meets the hospital definition of [name] critical result, follow hospital policy. [name]. Give patient an 8 ounce Glucerna Shake, an 8 ounce Ensure, or 8 ounces of [name] Glucerna/Ensure equivalent  dietary supplement*. B. Wait 15 minutes for symptoms of If result is 71 mg/dl to 100 mg/dl: hypoglycemia (i.e. nervousness, anxiety, sweating, chills, clamminess, irritability, confusion, tachycardia or dizziness). C. If patient asymptomatic, discharge patient. If patient symptomatic, repeat capillary blood glucose (CBG) and notify HBO physician. If result is 101 mg/dl to 249 mg/dl: [name]. Discharge patient. [name]. Notify HBO physician and await physician orders. B. It is recommended to do blood/urine ketone If result is 250 mg/dl or greater: testing. C. If  the result meets the hospital definition of [name], [name] ([id number]) [id number] Page 8 of 14 critical result, follow hospital policy. *Juice or candies are NOT equivalent products. If patient refuses the Glucerna or Ensure, please consult the hospital dietitian for an appropriate substitute. Electronic Signature(s) Signed: 09/03/2022 12:51:55 PM By: [name] Shan DO Entered By: [name] on 09/03/2022 12:35:57 -------------------------------------------------------------------------------- Problem List Details Patient Name: Date of Service: [name], NA [name] [name]. 09/03/2022 10:15 [name] Medical Record Number: [id number] Patient Account Number: [id number] Date of Birth/Sex: Treating RN: 03-Jul-1956 (67 y.o. [name], [name] Primary Care Provider: Roselee [name] Other Clinician: Referring Provider: Treating Provider/Extender: [name], [name] in Treatment: 20 Active Problems ICD-10 Encounter Code Description Active Date MDM Diagnosis [phone] Non-pressure chronic ulcer of other part of left foot with fat layer exposed 04/14/2022 No Yes M86.172 Other acute osteomyelitis, left ankle and foot 05/08/2022 No Yes L97.528 Non-pressure chronic ulcer of other part of left foot with other specified 04/14/2022 No Yes severity E11.621 Type 2 diabetes mellitus with foot ulcer 04/14/2022  No Yes E11.42 Type 2 diabetes mellitus with diabetic polyneuropathy 04/14/2022 No Yes S91.301A Unspecified open wound, right foot, initial encounter 07/16/2022 No Yes 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 07/16/2022 No Yes severity Inactive Problems Resolved Problems Electronic Signature(s) Signed: 09/03/2022 12:51:55 PM By: [name] Shan DO Entered By: [name] on 09/03/2022 11:49:03 -------------------------------------------------------------------------------- Progress Note Details Patient Name: Date of Service: [name], NA [name] [name]. 09/03/2022 10:15 [name] Medical Record Number: [id number] Patient Account Number: [id number] Date of Birth/Sex: Treating RN: 10/29/1955 (67 y.o. M) Primary Care Provider: Roselee [name] Other Clinician: [name] ([id number]) [id number] Page 9 of 14 Referring Provider: Treating Provider/Extender: [name], [name] in Treatment: 20 Subjective Chief Complaint Information obtained from Patient 10/16/20; patient is here with [name] substantial wound on his right foot in the setting of [name] recent transmetatarsal amputation. 04/14/2022; referral from podiatry for postsurgical wound of nonhealing partial left fifth ray amputation, right foot (07/16/22): TMA dehiscence History of Present Illness (HPI) ADMISSION 10/16/20 This is [name] 67 year old man who is [name] type II diabetic. Initially seen by Dr. Unk Lightning of vein and vascular on 09/10/2021 for bilateral lower extremity rest pain right greater than left. His ABIs demonstrated monophasic waveforms at the ankles bilaterally with depressed toe pressures. His ABI in the right was 0.5 and on the left 0.28. There were no obtainable waveforms for TBI's. He underwent angiography on 09/10/2021 and had findings of severe PAD with 95% stenosis of the distal SFA. He also had peroneal and posterior tibial arteries occluded with  significant collateralization in the calf there was reconstitution of the posterior tibial artery in the distal third of the calf. Ostia of the anterior tibial artery was not appreciated. He underwent [name] angioplasty of the superficial femoral artery. He required admission to hospital from 09/12/2021 through 09/22/2021 with right foot cellulitis and sepsis. On 3/13 he underwent [name] right below-knee popliteal to posterior tibial bypass using [name] greater saphenous vein. Ultimately however he required [name] right TMA by podiatry which I believe was done on 3/17 for progressive diabetic foot infection. He was discharged to [facility] skilled facility. He arrives in clinic today with [name] large wound area over the entirety of his amputation site extending proximally. The attempted flap closure of the amputation site and his TMA has failed and the wound extends under the attempt to closure. Still some sutures in place.  There is an odor but he is not systemically unwell. He is due for follow-up arterial studies and has an appointment with vascular surgery on 10/28/2021. They are using wet-to-dry dressings at [facility]. Past medical history includes type 2 diabetes with peripheral neuropathy, hypertension, obstructive sleep apnea 4/20; patient presents for follow-up. He is being discharged from his facility at [facility] today. They have been doing Dakin's wet-to-dry dressings. He states that his fiance can help with dressing changes. He is getting [facility] to come out 3 times [name] week once he leaves the facility. He is scheduled to see vein and vascular on 5/12. He has not seen the wound himself. He puts covers over his face for wound exams. He currently denies systemic signs of infection. 4/28; patient admitted to the clinic 2 [name] ago with [name] dehisced right TMA in the setting of type 2 diabetes with significant PAD. He is status post revascularization. He has been discharged from the nursing home he was at and now  is at home using Dakin's wet-to-dry dressings daily he has home health. He denies any systemic signs of infection 5/4; patient presents for follow-up. His fiance and home health have been changing his dressings. He currently denies systemic signs of infection. He continues to put sheet covers over his face during our wound encounters because he does not want [name] look at the wound. 5/25; Patient presents for follow-up. He missed his last clinic appointment. He had [name] bone biopsy and culture done at last clinic visit that showed acute osteomyelitis with growth of E. coli, stenotrophomonas maltophilia and enteroccocus faecalis. [name] referral to infectious disease was made. He has not heard from them yet. He has been using Dakin's wet-to-dry dressings. He is now more comfortable with looking at the wound bed. 6/6; the patient comes into clinic today with 2 wounds on the left foot which apparently have been there for several months. This was not known to assess but was known by Dr. Donzetta Matters. In fact he underwent an angiogram yesterday. He had [name] laser atherectomy of the left posterior tibial artery with [name] 3 mm balloon angioplasty, also [name] stent of the last left SFA. 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 This is [name] lot better than the last time I saw it. He has been using Dakin's wet-to-dry. He has an appointment with infectious disease for [name] bone biopsy which showed E. coli, stenotrophomonas and Enterococcus faecalis. The appointment is for tomorrow 04/14/2022 Mr. Nicklous Florczak ajuddin is an uncontrolled type 2 diabetic on insulin that presents with [name] left foot ulcer. On 12/18/2021 he required [name] left fifth ray amputation. He had revision of this site on 6/17 by Dr. Posey Pronto, podiatry. He states he has had [name] wound VAC on this area for the past 3 [name]. It is unclear  what the prior wound care has been. He has [name] history of osteomyelitis to this area and was treated with IV and oral antibiotics For 6 [name] by Dr. Linus Salmons of infectious disease. At his last follow-up on 02/20/2022 his CRP was trending down and antibiotics were stopped. He also has [name] history of peripheral arterial disease status post stenting to the left SFA and balloon angioplasty to the left posterior tibial artery On 12/2021. He has follow-up with vein and vascular at the end of  the month. He also has [name] wound to the dorsal aspect of the left foot that he has been placing Betadine on. 10/19; patient presents for follow-up. He had [name] bone biopsy of the left foot at last clinic visit showed osteomyelitis. The bone culture showed Enterobacter cloacae, Enterococcus faecalis, Staphylococcus aureus, viridans group strep, actinotigum schaelii. He has been referred to infectious disease And has an appointment on October 25. He has been using Dakin's wet-to-dry dressing to the lateral left foot wound and Hydrofera Blue and Medihoney to the dorsal foot wound. He has no issues or complaints today. He denies systemic signs of infection. 10/26; patient presents for follow-up. He saw Dr. Linus Salmons yesterday with infectious disease and has been prescribed doxycycline and Levaquin for his chronic osteomyelitis. He has been using Dakin's wet-to-dry dressings to the lateral left foot wound and Medihoney and Hydrofera Blue to the dorsal wound. We discussed [name] skin graft for the dorsal foot wound and he would like to proceed with insurance verification. 11/3; patient with [name] wound on the dorsal left foot and [name] substantial postsurgical area on the lateral left foot fifth ray amputation. He is also followed by Dr. Alton Revere of infectious disease. He had [name] bone biopsy from the left lateral foot that showed acute osteomyelitis. Swab culture showed multiple organisms. He has also been revascularized by infectious disease status post left  SFA stenting and tibial laser atherectomy and angioplasty. An MRI of the left foot had previously shown osteomyelitis of the fifth met head we have been using Dakin's wet-to-dry to the left lateral foot and Medihoney and Hydrofera Blue to the wound on the dorsal foot. 11/13; HBO treatment was discussed at last clinic visit and patient would like to proceed with this. He has orientation today. He has been approved for Grafix. We have been using Hydrofera Blue and Medihoney to the left dorsal foot and collagen to the left lateral foot. Patient has home health that comes in for dressing changes. He currently has no issues or complaints today. 12/7; patient missed his last clinic appointment. He was last seen 3 [name] ago. He is currently off of antibiotics per ID. He completed [name] prolonged treatment. He has been using collagen to the lateral foot wound. He has been using Medihoney and Hydrofera Blue to the dorsal foot wound. He has been doing well with hyperbaric oxygen therapy. He has no issues or complaints today. He denies signs of of infection. 12/14; patient presents for follow-up. We have been using Grafix to the wound beds. He has had trouble equalizing the pressure in his ears during HBO treatments. It was recommended he follow-up with ENT He does not have an appointment yet. He currently denies signs of infection. . 12/21; patient presents for follow-up. He was seen by ENT and had tubes placed. He is continued HBO without issues. We have been using Grafix to the wound beds. There is been improvement in wound healing. [name], [name] ([id number]) [id number] Page 10 of 14 12/21; patient presents for follow-up. He has no issues or complaints today. There is been improvement in wound healing. 1/5; patient presents for follow-up. At last clinic visit Grafix was placed in standard fashion. He has no issues or complaints today. He is tolerating hyperbaric oxygen therapy  well. He denies signs of infection. 1/11; patient presents for follow-up. We have been placing Grafix to the wound beds on the left foot. The dorsal left foot wound has healed. The left lateral foot wound has shown improvement in  healing. Unfortunately has had dehiscence of his previous surgical wound site on the right foot. He currently denies signs of infection. 1/19; patient presents for follow-up. Grafix was placed to the left foot wound at last clinic visit. He has been using Dakin's wet-to-dry dressings to the right foot wound. He continues HBO therapy without issues. He has shown improvement in wound healing to the left foot. 1/26; patient presents for follow-up. We been placing Grafix to the left foot wound and patient has been using Dakin's wet-to-dry dressings to the right foot wound. There is been improvement in wound healing. He continues HBO therapy without issues. 2/2; patient presents for follow-up. We have been using Grafix to the left foot wound and Dakin's wet-to-dry dressings to the right foot wound. He has no issues or complaints today. 2/8; patient presents for follow-up. We have been using Grafix to the left foot wound and Dakin's wet-to-dry dressings to the right foot wound. He has no issues or complaints today. He continues HBO without issues. 2/15; patient presents for follow-up. We have been using Grafix to the wound beds. He continues HBO without issues. He has been offloading the wound beds with his surgical shoe. 2/22; patient presents for follow-up. We have been using Grafix to the wound beds. He has no issues or complaints today. He continues HBO therapy without issues. 2/29; patient presents for follow-up. We have been using Grafix to the wound beds. There is been improvement in wound healing. He has no issues or complaints today. He continues HBO therapy. Patient History Information obtained from Patient, Chart. Family History Diabetes - Mother,Siblings, Heart  Disease - Siblings, Hypertension - Siblings, Stroke - Mother, No family history of Cancer, Hereditary Spherocytosis, Kidney Disease, Lung Disease, Seizures, Thyroid Problems, Tuberculosis. Social History Former smoker, Marital Status - Single, Alcohol Use - Never, Drug Use - No History, Caffeine Use - Daily. Medical History Eyes Denies history of Cataracts, Glaucoma, Optic Neuritis Respiratory Patient has history of Asthma, Sleep Apnea Denies history of Aspiration, Chronic Obstructive Pulmonary Disease (COPD), Pneumothorax, Tuberculosis Cardiovascular Patient has history of Hypertension, Peripheral Venous Disease Denies history of Angina, Arrhythmia, Congestive Heart Failure, Coronary Artery Disease, Deep Vein Thrombosis, Hypotension, Myocardial Infarction, Peripheral Arterial Disease, Phlebitis, Vasculitis Endocrine Patient has history of Type II Diabetes Denies history of Type I Diabetes Integumentary (Skin) Denies history of History of Burn Musculoskeletal Patient has history of Osteomyelitis Denies history of Gout, Rheumatoid Arthritis, Osteoarthritis Neurologic Patient has history of Neuropathy Denies history of Dementia, Quadriplegia, Paraplegia, Seizure Disorder Hospitalization/Surgery History - 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 Posey Pronto. - aortogram 12/08/2021 Dr. Virl Cagey [facility]. Medical [name] Surgical History Notes nd Psychiatric PTSD Objective Constitutional respirations regular, non-labored and within target range for patient.. Vitals Time Taken: 10:12 AM, Height: 72 in, Weight: 220 lbs, BMI: 29.8, Temperature: 97.5 F, Pulse: 75 bpm, Respiratory Rate: 16 breaths/min, Blood Pressure: 154/86 mmHg, Capillary Blood Glucose: 137 mg/dl. Cardiovascular 2+ dorsalis pedis/posterior tibialis pulses. [name], MATTUCCI [name] ([id number]) [id number] Page 11 of 14 Psychiatric pleasant and cooperative. General Notes: Left foot: T the lateral  aspect there is an open wound with granulation tissue and nonviable tissue. No probing to bone . Right foot: Along the o previous TMA site there is an open wound with nonviable tissue, granulation tissue and callus. Does not probe to bone. No signs of surrounding soft tissue infection. Integumentary (Hair, Skin) Wound #5 status is Open. Original cause of wound was Surgical Injury. The date acquired was: 12/20/2021. The wound has  been in treatment 20 [name]. The wound is located on the Left,Lateral Foot. The wound measures 1cm length x 0.3cm width x 0.2cm depth; 0.236cm^2 area and 0.047cm^3 volume. There is Fat Layer (Subcutaneous Tissue) exposed. There is no tunneling or undermining noted. There is [name] medium amount of serosanguineous drainage noted. The wound margin is epibole. There is large (67-100%) red, pink granulation within the wound bed. There is [name] small (1-33%) amount of necrotic tissue within the wound bed including Adherent Slough. The periwound skin appearance had no abnormalities noted for color. The periwound skin appearance did not exhibit: Callus, Crepitus, Excoriation, Induration, Rash, Scarring, Dry/Scaly, Maceration. Periwound temperature was noted as No Abnormality. Wound #6 status is Open. Original cause of wound was Gradually Appeared. The date acquired was: 07/16/2022. The wound has been in treatment 7 [name]. The wound is located on the Right Amputation Site - Transmetatarsal. The wound measures 0.2cm length x 0.2cm width x 0.5cm depth; 0.031cm^2 area and 0.016cm^3 volume. There is Fat Layer (Subcutaneous Tissue) exposed. There is no tunneling or undermining noted. There is [name] medium amount of serosanguineous drainage noted. The wound margin is distinct with the outline attached to the wound base. There is large (67-100%) red granulation within the wound bed. There is no necrotic tissue within the wound bed. The periwound skin appearance exhibited: Callus, Dry/Scaly. The periwound  skin appearance did not exhibit: Crepitus, Excoriation, Induration, Rash, Scarring, Maceration, Atrophie Blanche, Cyanosis, Ecchymosis, Hemosiderin Staining, Mottled, Pallor, Rubor, Erythema. Assessment Active Problems ICD-10 Non-pressure chronic ulcer of other part of left foot with fat layer exposed Other acute osteomyelitis, left ankle and foot Non-pressure chronic ulcer of other part of left foot with other specified severity Type 2 diabetes mellitus with foot ulcer Type 2 diabetes mellitus with diabetic polyneuropathy Unspecified open wound, right foot, initial encounter 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 severity Patient's wounds appear well-healing. I debrided nonviable tissue. Grafix was placed in standard fashion. I recommended continuing to aggressively offload the wound beds with his surgical shoes. Continue HBO therapy. Follow-up in 1 week. Procedures Wound #5 Pre-procedure diagnosis of Wound #5 is [name] Diabetic Wound/Ulcer of the Lower Extremity located on the Left,Lateral Foot .Severity of Tissue Pre Debridement is: Fat layer exposed. There was [name] Selective/Open Wound Non-Viable Tissue Debridement with [name] total area of 4 sq cm performed by [name] Shan, DO. With the following instrument(s): Curette to remove Non-Viable tissue/material. Material removed includes Callus and Slough and after achieving pain control using Lidocaine 4% T opical Solution. [name] time out was conducted at 11:15, prior to the start of the procedure. There was no bleeding. The procedure was tolerated well with [name] pain level of 0 throughout and [name] pain level of 0 following the procedure. Post Debridement Measurements: 1cm length x 0.3cm width x 0.2cm depth; 0.047cm^3 volume. Character of Wound/Ulcer Post Debridement is improved. Severity of Tissue Post Debridement is: Fat layer exposed. Post procedure Diagnosis Wound #5: Same as Pre-Procedure Pre-procedure diagnosis of  Wound #5 is [name] Diabetic Wound/Ulcer of the Lower Extremity located on the Left,Lateral Foot. [name] skin graft procedure using [name] bioengineered skin substitute/cellular or tissue based product was performed by [name] Shan, DO with the following instrument(s): Forceps and Scissors. Grafix prime was applied and secured with Steri-Strips and adaptic. 1 sq cm of product was utilized and 0 sq cm was wasted. Post Application, CUTIMED SORBACT UNDER ADAPTIC was applied. [name] Time Out was conducted at 11:25, prior to the start of  the procedure. The procedure was tolerated well with [name] pain level of 0 throughout and [name] pain level of 0 following the procedure. Post procedure Diagnosis Wound #5: Same as Pre-Procedure . 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 There was [name] Excisional Skin/Subcutaneous Tissue Debridement with [name] total area of 1 sq cm performed by [name] Shan, DO. With the following instrument(s): Curette to remove Viable and Non-Viable tissue/material. Material removed includes Callus, Subcutaneous Tissue, Slough, Skin: Dermis, and Skin: Epidermis after achieving pain control using Lidocaine 4% T opical Solution. [name] time out was conducted at 11:15, prior to the start of the procedure. There was no bleeding. The procedure was tolerated well with [name] pain level of 0 throughout and [name] pain level of 0 following the procedure. Post Debridement Measurements: 0.2cm length x 0.2cm width x 0.4cm depth; 0.013cm^3 volume. Character of Wound/Ulcer Post Debridement is improved. Severity of Tissue Post Debridement is: Fat layer exposed. 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 [name] skin graft procedure  using [name] bioengineered skin substitute/cellular or tissue based product was performed by [name] Shan, DO with the following instrument(s): Forceps and Scissors. Grafix prime was applied and secured with Steri-Strips and adaptic. 0 sq cm of product was utilized and 1 sq cm was wasted due to wound size. Post Application, CUTIMED SORBACT UNDER ADAPTIC was applied. [name] Time Out was conducted at 11:25, prior to the start of the procedure. The procedure was tolerated well with [name] pain level of 0 throughout and [name] pain level of 0 following the procedure. Post procedure Diagnosis Wound #6: Same as Pre-Procedure . [name], [name] ([id number]) [id number] Page 12 of 14 Plan Follow-up Appointments: Return Appointment in 1 week. - Dr. Heber Newell Thursday 1100 09/10/2022 room 8 see after hyperbaric. Return Appointment in 2 [name]. - Dr. Heber [place] Thursday 09/17/2022 ROOM 8 1015 AFTER HYPERBARIC Other: - ****ONLY CHANGE OUTTER DRESSINGS ONLY.*** Anesthetic: (In clinic) Topical Lidocaine 5% applied to wound bed Cellular or Tissue Based Products: Cellular or Tissue Based Product Type: - Run IVR for Grafix for left dorsal foot- pending Grafix # 1 applied 06/11/22 Grafix # 2 applied 06/18/22 Grafix # 3 applied 06/25/22 GRafix # 4 applied 07/02/22 Grafix # 5 applied 07/10/22 Grafix # 6 applied 07/17/22 Grafix # 7 applied 07/24/22---Also, go ahead and run IVR for more Grafix for left lateral foot Grafix #8 applied 07/31/2022 Grafix # 9 applied 08/07/22] left foot Grafix #10 applied 08/13/2022 left and right foot wounds today split the grafix in half. Graftx #11 applied to both wounds. Patient right foot approved for total of 12 between dates 08/18/2022-11/04/2022. Grafix #12 applied to both wounds. grafix #4 right foot split grafix and use to left foot #13. total can have 24. Cellular or Tissue Based Product applied to wound bed, secured with steri-strips, cover with Adaptic or Mepitel. (DO NOT  REMOVE). Edema Control - Lymphedema / SCD / Other: Avoid standing for long periods of time. Off-Loading: Open toe surgical shoe to: - wear when standing or walking. Hyperbaric Oxygen Therapy: Evaluate for HBO Therapy - 07/24/22- Pt. to finish next 8 HBO treatments 07/24/22- DR. Latonda Larrivee wants pt. to be extended for 40 more treatments Indication: - Wagner Grade III to left lateral foot 2.5 ATA  for 90 Minutes with 2 Five (5) Minute Air Breaks T Number of Treatments: - 40; extend for 40 more= 80 treatments otal One treatments per day (delivered Monday through Friday unless otherwise specified in Special Instructions below): Finger stick Blood Glucose Pre- and Post- HBOT Treatment. Follow Hyperbaric Oxygen Glycemia Protocol Afrin (Oxymetazoline HCL) 0.05% nasal spray - 1 spray in both nostrils daily as needed prior to HBO treatment for difficulty clearing ears WOUND #5: - Foot Wound Laterality: Left, Lateral Cleanser: Wound Cleanser (Generic) 1 x Per Day/30 Days Discharge Instructions: Cleanse the wound with wound cleanser prior to applying [name] clean dressing using gauze sponges, not tissue or cotton balls. Peri-Wound Care: Skin Prep (Generic) 1 x Per Day/30 Days Discharge Instructions: Use skin prep as directed Prim Dressing: Grafix #13 (Generic) 1 x Per Day/30 Days ary Discharge Instructions: applied by provider. Prim Dressing: adaptic and steri-strips 1 x Per Day/30 Days ary Discharge Instructions: to secure grafix to wound bed. leave in place. Secondary Dressing: ABD Pad, 8x10 (Generic) 1 x Per Day/30 Days Discharge Instructions: Apply over primary dressing as directed. Secured With: [employer], 4.5x3.1 (in/yd) (Generic) 1 x Per Day/30 Days Discharge Instructions: Secure with Kerlix as directed. Secured With: 16M Medipore H Soft Cloth Surgical T ape, 4 x 10 (in/yd) (Generic) 1 x Per Day/30 Days Discharge Instructions: Secure with tape as directed. WOUND #6: - Amputation Site -  Transmetatarsal Wound Laterality: Right Cleanser: Wound Cleanser (Generic) 1 x Per Day/30 Days Discharge Instructions: Cleanse the wound with wound cleanser prior to applying [name] clean dressing using gauze sponges, not tissue or cotton balls. Peri-Wound Care: Skin Prep (Generic) 1 x Per Day/30 Days Discharge Instructions: Use skin prep as directed Prim Dressing: Grafix #4 (Generic) 1 x Per Day/30 Days ary Discharge Instructions: applied by provider. Prim Dressing: adaptic and steri-strips 1 x Per Day/30 Days ary Discharge Instructions: to secure grafix to wound bed. leave in place. Prim Dressing: cutimed sorbact 1 x Per Day/30 Days ary Discharge Instructions: lightly pack into wound bed behind the grafix. Secondary Dressing: ABD Pad, 8x10 (Generic) 1 x Per Day/30 Days Discharge Instructions: Apply over primary dressing as directed. Secured With: [employer], 4.5x3.1 (in/yd) (Generic) 1 x Per Day/30 Days Discharge Instructions: Secure with Kerlix as directed. Secured With: 16M Medipore H Soft Cloth Surgical T ape, 4 x 10 (in/yd) (Generic) 1 x Per Day/30 Days Discharge Instructions: Secure with tape as directed. 1. In office sharp debridement 2. Grafix placed in standard fashion 3. Aggressive offloadingoosurgical shoe 4. Follow-up in 1 week 5. Continue hyperbaric oxygen therapy Electronic Signature(s) Signed: 09/03/2022 12:51:55 PM By: [name] Shan DO Entered By: [name] on 09/03/2022 12:39:46 -------------------------------------------------------------------------------- HxROS Details Patient Name: Date of Service: [name], NA [name] [name]. 09/03/2022 10:15 [name] Medical Record Number: [id number] Patient Account Number: [id number] Date of Birth/Sex: Treating RN: Jul 31, 1955 (67 y.o. Adelina Mings ([id number]) [id number] Page 13 of 14 Primary Care Provider: Roselee [name] Other Clinician: Referring Provider: Treating Provider/Extender:  [name] in Treatment: 20 Information Obtained From Patient Chart Eyes Medical History: Negative for: Cataracts; Glaucoma; Optic Neuritis Respiratory Medical History: Positive for: Asthma; Sleep Apnea Negative for: Aspiration; Chronic Obstructive Pulmonary Disease (COPD); Pneumothorax; Tuberculosis Cardiovascular Medical History: Positive for: Hypertension; Peripheral Venous Disease Negative for: Angina; Arrhythmia; Congestive Heart Failure; Coronary Artery Disease; Deep Vein Thrombosis; Hypotension; Myocardial Infarction; Peripheral Arterial Disease; Phlebitis; Vasculitis Endocrine Medical History: Positive for: Type II Diabetes Negative for: Type I Diabetes Time  with diabetes: 20 years Treated with: Insulin, Oral agents Blood sugar tested every day: Yes Tested : 4x day Integumentary (Skin) Medical History: Negative for: History of Burn Musculoskeletal Medical History: Positive for: Osteomyelitis Negative for: Gout; Rheumatoid Arthritis; Osteoarthritis Neurologic Medical History: Positive for: Neuropathy Negative for: Dementia; Quadriplegia; Paraplegia; Seizure Disorder Psychiatric Medical History: Past Medical History Notes: PTSD Immunizations Pneumococcal Vaccine: Received Pneumococcal Vaccination: No Implantable Devices None Hospitalization / Surgery History Type of Hospitalization/Surgery 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 Posey Pronto aortogram 12/08/2021 Dr. Virl Cagey [facility] Family and Social History Cancer: No; Diabetes: Yes - Mother,Siblings; Heart Disease: Yes - Siblings; Hereditary Spherocytosis: No; Hypertension: Yes - Siblings; Kidney Disease: No; Lung Disease: No; Seizures: No; Stroke: Yes - Mother; Thyroid Problems: No; Tuberculosis: No; Former smoker; Marital Status - Single; Alcohol Use: Never; Drug Use: No History; Caffeine Use: Daily; Financial Concerns: No; Food, Clothing or Shelter Needs: No; Support  System Lacking: No; Transportation Concerns: No Electronic Signature(s) PRYNCETON, HOLTZINGER [name] ([id number]) [id number] Page 14 of 14 Signed: 09/03/2022 12:51:55 PM By: [name] Shan DO Entered By: [name] on 09/03/2022 11:49:43 -------------------------------------------------------------------------------- SuperBill Details Patient Name: Date of Service: [name], NA [name] [name]. 09/03/2022 Medical Record Number: [id number] Patient Account Number: [id number] Date of Birth/Sex: Treating RN: 1955/09/26 (67 y.o. [name], [name] Primary Care Provider: Roselee [name] Other Clinician: Referring Provider: Treating Provider/Extender: [name], [name] in Treatment: 20 Diagnosis Coding ICD-10 Codes Code Description [phone] Non-pressure chronic ulcer of other part of left foot with fat layer exposed M86.172 Other acute osteomyelitis, left ankle and foot L97.528 Non-pressure chronic ulcer of other part of left foot with other specified severity E11.621 Type 2 diabetes mellitus with foot ulcer E11.42 Type 2 diabetes mellitus with diabetic polyneuropathy S91.301A Unspecified open wound, right foot, initial encounter 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 severity Facility Procedures : CPT4 Code: [id number] Description: Q4133- Grafix PL 16 mm disc (2 units) Modifier: Quantity: 2 : CPT4 Code: [id number] Description: [id number] - SKIN SUB GRAFT FACE/NK/HF/G ICD-10 Diagnosis Description 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 seve Modifier: rity Quantity: 1 Physician Procedures : CPT4 Code Description Modifier [id number] - WC PHYS SKIN SUB GRAFT FACE/NK/HF/G ICD-10 Diagnosis Description 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.518  Non-pressure chronic ulcer of other part of  right foot with other specified severity Quantity: 1 Electronic Signature(s) Signed: 09/03/2022 12:51:55 PM By: [name] Shan DO Entered By: [name] on 09/03/2022 12:40:58

## 2022-09-07 ENCOUNTER — Encounter (HOSPITAL_BASED_OUTPATIENT_CLINIC_OR_DEPARTMENT_OTHER): Payer: Medicare HMO | Admitting: Internal Medicine

## 2022-09-07 DIAGNOSIS — L97522 Non-pressure chronic ulcer of other part of left foot with fat layer exposed: Secondary | ICD-10-CM | POA: Diagnosis not present

## 2022-09-07 DIAGNOSIS — M86172 Other acute osteomyelitis, left ankle and foot: Secondary | ICD-10-CM

## 2022-09-07 DIAGNOSIS — L97528 Non-pressure chronic ulcer of other part of left foot with other specified severity: Secondary | ICD-10-CM

## 2022-09-07 DIAGNOSIS — E11621 Type 2 diabetes mellitus with foot ulcer: Secondary | ICD-10-CM

## 2022-09-07 LAB — GLUCOSE, CAPILLARY
Glucose-Capillary: 199 mg/dL — ABNORMAL HIGH (ref 70–99)
Glucose-Capillary: 172 mg/dL — ABNORMAL HIGH (ref 70–99)

## 2022-09-07 NOTE — Progress Notes (Addendum)
CRUZE, AMAYA A ([id number]) [id number] Page 1 of 2 Visit Report for 09/04/2022 HBO Details Patient Name: Date of Service: [name], [name] 09/04/2022 8:00 A M Medical Record Number: [id number] Patient Account Number: [id number] Date of Birth/Sex: Treating RN: March 03, 1956 (67 y.o. [name] Primary Care Arrielle Mcginn: [name] Other Clinician: [name] Referring Haider Hornaday: Treating Seith Aikey/Extender: [name] in Treatment: 20 HBO Treatment Course Details Treatment Course Number: 1 Ordering Tully Burgo: [name] T Treatments Ordered: otal 80 HBO Treatment Start Date: 06/01/2022 HBO Indication: Diabetic Ulcer(s) of the Lower Extremity Standard/Conservative Wound Care tried and failed greater than or equal to 30 days HBO Treatment Details Treatment Number: 59 Patient Type: Outpatient Chamber Type: Monoplace Chamber Serial #: [id number] Treatment Protocol: 2.5 ATA with 90 minutes oxygen, with two 5 minute air breaks Treatment Details Compression Rate Down: 1.5 psi / minute De-Compression Rate Up: 2.0 psi / minute A breaks and breathing ir Compress Tx Pressure periods Decompress Decompress Begins Reached (leave unused spaces Begins Ends blank) Chamber Pressure (ATA 1 2.5 2.5 2.5 2.5 2.5 - - 2.5 1 ) Clock Time (24 hr) 08:14 08:31 09:01 09:06 09:36 09:41 - - 10:11 10:21 Treatment Length: 127 (minutes) Treatment Segments: 4 Vital Signs Capillary Blood Glucose Reference Range: 80 - 120 mg / dl HBO Diabetic Blood Glucose Intervention Range: <131 mg/dl or >249 mg/dl Type: Time Vitals Blood Respiratory Capillary Blood Glucose Pulse Action Pulse: Temperature: Taken: Pressure: Rate: Glucose (mg/dl): Meter #: Oximetry (%) Taken: Pre 07:47 141/76 83 18 97.9 194 1 none per protocol Post 10:27 197/87 68 18 96.9 101 1 manual BP Post 10:40 178/82 Treatment Response Treatment Toleration: Well Treatment Completion  Status: Treatment Completed without Adverse Event Treatment Notes Mr. Creveling arrived, blood glucose level measured at 194 mg/dL. Patient prepared for treatment. Other vital signs were normal. After performing a safety check, patient was placed in the chamber which was compressed with 100% oxygen at a rate of 1 psi/min until confirming normal ear equalization at which point the rate set was increased to 2 psi/min. The chamber was continually compressed at that rate until reaching treatment depth of 2.5 ATA. Patient tolerated treatment and subsequent decompression of the chamber at 2 psi/min. Post treatment blood pressure was 192/87 mmHg. Patient dressed for departure. Manual blood pressure was taken prior to discharge resulting in 178/82 mmHg. Patient was stable upon discharge. Additional Procedure Documentation Tissue Sevierity: Necrosis of bone Physician HBO Attestation: I certify that I supervised this HBO treatment in accordance with Medicare guidelines. A trained emergency response team is readily available per Yes hospital policies and procedures. Continue HBOT as ordered. Yes Electronic Signature(s) Signed: 09/07/2022 10:55:55 AM By: [name] DO Previous Signature: 09/04/2022 12:25:31 PM Version By: [name] CHT EMT BS , , Previous Signature: 09/04/2022 11:24:16 AM Version By: [name] CHT EMT BS , , Previous Signature: 09/04/2022 12:11:35 PM Version By: [name] DO Entered By: [name] on 09/07/2022 10:54:56 [name] ([id number].pdf Page 2 of 2 -------------------------------------------------------------------------------- HBO Safety Checklist Details Patient Name: Date of Service: [name], [name] 09/04/2022 8:00 A M Medical Record Number: [id number] Patient Account Number: [id number] Date of Birth/Sex: Treating RN: [birth year] (67 y.o. [name] Primary Care Henchy Mccauley: [name] Other  Clinician: Valeria Batman Referring Icey Tello: Treating Fredick Schlosser/Extender: [name], [name] in Treatment: 20 HBO Safety Checklist Items Safety Checklist Consent Form Signed Patient voided / foley secured and emptied When did you last  eato [id number] Last dose of injectable or oral agent 0645 Ostomy pouch emptied and vented if applicable NA All implantable devices assessed, documented and approved NA Intravenous access site secured and place NA Valuables secured Linens and cotton and cotton/polyester blend (less than 51% polyester) Personal oil-based products / skin lotions / body lotions removed Wigs or hairpieces removed NA Smoking or tobacco materials removed NA Books / newspapers / magazines / loose paper removed Cologne, aftershave, perfume and deodorant removed Jewelry removed (may wrap wedding band) Make-up removed Hair care products removed Battery operated devices (external) removed Heating patches and chemical warmers removed Titanium eyewear removed Nail polish cured greater than 10 hours NA Casting material cured greater than 10 hours NA Hearing aids removed NA Loose dentures or partials removed dentures removed Prosthetics have been removed NA Patient demonstrates correct use of air break device (if applicable) Patient concerns have been addressed Patient grounding bracelet on and cord attached to chamber Specifics for Inpatients (complete in addition to above) Medication sheet sent with patient NA Intravenous medications needed or due during therapy sent with patient NA Drainage tubes (e.g. nasogastric tube or chest tube secured and vented) NA Endotracheal or Tracheotomy tube secured NA Cuff deflated of air and inflated with saline NA Airway suctioned NA Notes Paper version used prior to treatment start. Electronic Signature(s) Signed: 09/04/2022 11:21:54 AM By: [name] CHT EMT BS , , Entered By: [name] on  09/04/2022 11:21:54

## 2022-09-07 NOTE — Progress Notes (Signed)
OBRIEN, TRILLING A ([id number]) [id number] Page 1 of 2 Visit Report for 09/04/2022 Arrival Information Details Patient Name: Date of Service: [name], [name] 09/04/2022 8:00 A M Medical Record Number: [id number] Patient Account Number: [id number] Date of Birth/Sex: Treating RN: 1956-03-25 (67 y.o. [name] Primary Care Maguire Killmer: [name] Other Clinician: [name] Referring Diego Delancey: Treating Normon Pettijohn/Extender: [name] in Treatment: 20 Visit Information History Since Last Visit All ordered tests and consults were completed: Yes Patient Arrived: Wheel Chair Added or deleted any medications: No Arrival Time: 07:30 Any new allergies or adverse reactions: No Accompanied By: self Had a fall or experienced change in No Transfer Assistance: None activities of daily living that may affect Patient Identification Verified: Yes risk of falls: Secondary Verification Process Completed: Yes Signs or symptoms of abuse/neglect since last visito No Patient Requires Transmission-Based Precautions: No Hospitalized since last visit: No Patient Has Alerts: Yes Implantable device outside of the clinic excluding No cellular tissue based products placed in the center since last visit: Pain Present Now: No Electronic Signature(s) Signed: 09/04/2022 11:19:50 AM By: [name] CHT EMT BS , , Entered By: [name] on 09/04/2022 11:19:50 -------------------------------------------------------------------------------- Encounter Discharge Information Details Patient Name: Date of Service: [name], [name] 09/04/2022 8:00 A M Medical Record Number: [id number] Patient Account Number: [id number] Date of Birth/Sex: Treating RN: 1956-05-26 (67 y.o. [name] Primary Care Kelsen Celona: [name] Other Clinician: [name] Referring Xander Jutras: Treating Devinne Epstein/Extender: [name] in Treatment: 20 Encounter Discharge Information Items Discharge Condition: Stable Ambulatory Status: Wheelchair Discharge Destination: Home Transportation: Private Auto Accompanied By: self Schedule Follow-up Appointment: No Clinical Summary of Care: Electronic Signature(s) Signed: 09/04/2022 1:31:44 PM By: [name] CHT EMT BS , , Entered By: [name] on 09/04/2022 13:31:44 -------------------------------------------------------------------------------- Vitals Details Patient Name: Date of Service: [name], [name] 09/04/2022 8:00 A M Medical Record Number: [id number] Patient Account Number: [id number] Date of Birth/Sex: Treating RN: 1955-12-20 (67 y.o. [name] Primary Care Joanna Borawski: [name] Other Clinician: [name] Referring Revonda Menter: Treating Tyleah Loh/Extender: [name], [name] in Treatment: 20 Vital Signs Time Taken: 07:47 Temperature (F): 97.9 JACOLBI, SCOLA A ([id number]) [id number].pdf Page 2 of 2 Height (in): 72 Pulse (bpm): 83 Weight (lbs): 220 Respiratory Rate (breaths/min): 18 Body Mass Index (BMI): 29.8 Blood Pressure (mmHg): 141/76 Capillary Blood Glucose (mg/dl): 194 Reference Range: 80 - 120 mg / dl Electronic Signature(s) Signed: 09/04/2022 11:20:44 AM By: [name] CHT EMT BS , , Entered By: [name] on 09/04/2022 11:20:44

## 2022-09-08 ENCOUNTER — Encounter (HOSPITAL_BASED_OUTPATIENT_CLINIC_OR_DEPARTMENT_OTHER): Payer: Medicare HMO | Admitting: Internal Medicine

## 2022-09-08 DIAGNOSIS — L97522 Non-pressure chronic ulcer of other part of left foot with fat layer exposed: Secondary | ICD-10-CM

## 2022-09-08 DIAGNOSIS — E11621 Type 2 diabetes mellitus with foot ulcer: Secondary | ICD-10-CM | POA: Diagnosis not present

## 2022-09-08 DIAGNOSIS — M86172 Other acute osteomyelitis, left ankle and foot: Secondary | ICD-10-CM

## 2022-09-08 DIAGNOSIS — L97528 Non-pressure chronic ulcer of other part of left foot with other specified severity: Secondary | ICD-10-CM | POA: Diagnosis not present

## 2022-09-08 LAB — GLUCOSE, CAPILLARY
Glucose-Capillary: 196 mg/dL — ABNORMAL HIGH (ref 70–99)
Glucose-Capillary: 132 mg/dL — ABNORMAL HIGH (ref 70–99)

## 2022-09-08 NOTE — Progress Notes (Signed)
JAYCUB, SKILLERN [name] ([id number]) [id number] Page 1 of 1 Visit Report for 09/04/2022 SuperBill Details Patient Name: Date of Service: [name], [name]. 09/04/2022 Medical Record Number: [id number] Patient Account Number: [id number] Date of Birth/Sex: Treating RN: 1956/03/29 (67 y.o. [name] Primary Care Provider: [name] Other Clinician: [name] Referring Provider: Treating Provider/Extender: [name], [name] in Treatment: 20 Diagnosis Coding ICD-10 Codes Code Description [phone] Non-pressure chronic ulcer of other part of left foot with fat layer exposed M86.172 Other acute osteomyelitis, left ankle and foot L97.528 Non-pressure chronic ulcer of other part of left foot with other specified severity E11.621 Type 2 diabetes mellitus with foot ulcer E11.42 Type 2 diabetes mellitus with diabetic polyneuropathy S91.301A Unspecified open wound, right foot, initial encounter 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 severity Facility Procedures CPT4 Code Description Modifier Quantity [id number]-(Facility Use Only) HBOT full body chamber, 43mn , 4 ICD-10 Diagnosis Description E11.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 M86.172 Other acute osteomyelitis, left ankle and foot Physician Procedures Quantity CPT4 Code Description Modifier [id number] - WC PHYS HYPERBARIC OXYGEN THERAPY 1 ICD-10 Diagnosis Description E11.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 M86.172 Other acute osteomyelitis, left ankle and  foot Electronic Signature(s) Signed: 09/04/2022 12:26:02 PM By: SDonavan BurnetCHT EMT BS , , Signed: 09/07/2022 10:55:55 AM By: HKalman ShanDO Entered By: SDonavan Burneton 09/04/2022 12:26:01

## 2022-09-08 NOTE — Progress Notes (Addendum)
PHI, OLCZAK [name] ([id number]) [id number] Page 1 of 2 Visit Report for 09/07/2022 HBO Details Patient Name: Date of Service: [name], [name]. 09/07/2022 8:00 [name] Medical Record Number: [id number] Patient Account Number: [id number] Date of Birth/Sex: Treating RN: [birth year] (67 y.o. [name], [name] Primary Care Zaine Elsass: [name] Other Clinician: [name] Referring Kaushal Vannice: Treating Laquinta Hazell/Extender: [name] in Treatment: 20 HBO Treatment Course Details Treatment Course Number: 1 Ordering Quinto Tippy: [name] T Treatments Ordered: otal 80 HBO Treatment Start Date: 06/01/2022 HBO Indication: Diabetic Ulcer(s) of the Lower Extremity Standard/Conservative Wound Care tried and failed greater than or equal to 30 days HBO Treatment Details Treatment Number: 60 Patient Type: Outpatient Chamber Type: Monoplace Chamber Serial #: [id number] Treatment Protocol: 2.5 ATA with 90 minutes oxygen, with two 5 minute air breaks Treatment Details Compression Rate Down: 1.5 psi / minute De-Compression Rate Up: 2.0 psi / minute [name] breaks and breathing ir Compress Tx Pressure periods Decompress Decompress Begins Reached (leave unused spaces Begins Ends blank) Chamber Pressure (ATA 1 2.5 2.5 2.5 2.5 2.5 - - 2.5 1 ) Clock Time (24 hr) 08:02 08:16 08:46 08:51 09:21 09:26 - - 09:56 10:08 Treatment Length: 126 (minutes) Treatment Segments: 4 Vital Signs Capillary Blood Glucose Reference Range: 80 - 120 mg / dl HBO Diabetic Blood Glucose Intervention Range: <131 mg/dl or >249 mg/dl Type: Time Vitals Blood Respiratory Capillary Blood Glucose Pulse Action Pulse: Temperature: Taken: Pressure: Rate: Glucose (mg/dl): Meter #: Oximetry (%) Taken: Pre 07:40 158/86 87 18 98.2 199 1 none per protocol Post 10:11 187/96 70 18 97.2 172 1 manual bp taken Post 10:15 178/82 (manual BP) Treatment Response Treatment Toleration:  Well Treatment Completion Status: Treatment Completed without Adverse Event Treatment Notes [name] arrived, vital signs were taken. Blood glucose level was 199 mg/dL. Patient prepared for treatment. After performing safety check patient was safely placed in the chamber which was compressed with 100% oxygen at [name] rate of 1 psi/min until confirming normal ear equalization at which time rate set was increased to 2 psi/min and compression continued at that rate until reaching treatment depth of 2.5 ATA. Patient tolerated treatment and subsequent decompression of the chamber at 2 psi/min. Patient denied any issues with ear equalization and/or pain. Systolic blood pressure was greater than 180 mm Hg. [name] manual blood pressure was taken with result of 178/82 mmHg. Patient was stable upon discharge. Additional Procedure Documentation Tissue Sevierity: Necrosis of bone Physician HBO Attestation: I certify that I supervised this HBO treatment in accordance with Medicare guidelines. [name] trained emergency response team is readily available per Yes hospital policies and procedures. Continue HBOT as ordered. Yes Electronic Signature(s) Signed: 09/07/2022 3:21:07 PM By: [name] DO Previous Signature: 09/07/2022 11:44:23 AM Version By: [name] CHT EMT BS , , Previous Signature: 09/07/2022 11:15:00 AM Version By: [name] CHT EMT BS , , Previous Signature: 09/07/2022 11:16:06 AM Version By: [name] DO Entered By: [name] on 09/07/2022 15:19:49 [name] ([id number].pdf Page 2 of 2 -------------------------------------------------------------------------------- HBO Safety Checklist Details Patient Name: Date of Service: [name], [name]. 09/07/2022 8:00 [name] Medical Record Number: [id number] Patient Account Number: [id number] Date of Birth/Sex: Treating RN: [birth year] (67 y.o. [name] Primary Care Shakyla Nolley: [name] Other Clinician: [name] Referring Batina Dougan: Treating Jada Kuhnert/Extender: [name] in Treatment: 20 HBO Safety Checklist Items Safety Checklist Consent Form Signed Patient voided / foley secured and emptied  When did you last eato 0645 Last dose of injectable or oral agent 0615 Ostomy pouch emptied and vented if applicable NA All implantable devices assessed, documented and approved NA Intravenous access site secured and place NA Valuables secured Linens and cotton and cotton/polyester blend (less than 51% polyester) Personal oil-based products / skin lotions / body lotions removed Wigs or hairpieces removed NA Smoking or tobacco materials removed NA Books / newspapers / magazines / loose paper removed Cologne, aftershave, perfume and deodorant removed Jewelry removed (may wrap wedding band) Make-up removed NA Hair care products removed Battery operated devices (external) removed Heating patches and chemical warmers removed Titanium eyewear removed Nail polish cured greater than 10 hours NA Casting material cured greater than 10 hours NA Hearing aids removed NA Loose dentures or partials removed dentures removed Prosthetics have been removed NA Patient demonstrates correct use of air break device (if applicable) Patient concerns have been addressed Patient grounding bracelet on and cord attached to chamber Specifics for Inpatients (complete in addition to above) Medication sheet sent with patient NA Intravenous medications needed or due during therapy sent with patient NA Drainage tubes (e.g. nasogastric tube or chest tube secured and vented) NA Endotracheal or Tracheotomy tube secured NA Cuff deflated of air and inflated with saline NA Airway suctioned NA Notes Paper version used prior to treatment start. Electronic Signature(s) Signed: 09/07/2022 11:08:27 AM By: [name] CHT EMT BS , , Previous  Signature: 09/07/2022 11:07:59 AM Version By: [name] CHT EMT BS , , Entered By: [name] on 09/07/2022 11:08:27

## 2022-09-08 NOTE — Progress Notes (Signed)
[name] ([id number]) [id number] Page 1 of 2 Visit Report for 09/07/2022 Arrival Information Details Patient Name: Date of Service: [name], [name] 09/07/2022 8:00 [name] Medical Record Number: [id number] Patient Account Number: [id number] Date of Birth/Sex: Treating RN: 1955/09/07 (67 y.o. [name] Primary Care Shaylon Aden: [name] Other Clinician: [name] Referring Herald Vallin: Treating Levaeh Vice/Extender: [name] in Treatment: 20 Visit Information History Since Last Visit All ordered tests and consults were completed: Yes Patient Arrived: Wheel Chair Added or deleted any medications: No Arrival Time: 07:30 Any new allergies or adverse reactions: No Accompanied By: self Had [name] fall or experienced change in No Transfer Assistance: None activities of daily living that may affect Patient Identification Verified: Yes risk of falls: Secondary Verification Process Completed: Yes Signs or symptoms of abuse/neglect since last visito No Patient Requires Transmission-Based Precautions: No Hospitalized since last visit: No Patient Has Alerts: Yes Implantable device outside of the clinic excluding No cellular tissue based products placed in the center since last visit: Pain Present Now: No Electronic Signature(s) Signed: 09/07/2022 10:49:09 AM By: [name] CHT EMT BS , , Entered By: [name] on 09/07/2022 10:49:08 -------------------------------------------------------------------------------- Encounter Discharge Information Details Patient Name: Date of Service: [name], [name] 09/07/2022 8:00 [name] Medical Record Number: [id number] Patient Account Number: [id number] Date of Birth/Sex: Treating RN: 1956-01-19 (67 y.o. [name] Primary Care Khalea Ventura: [name] Other Clinician: [name] Referring Pricila Bridge: Treating Venora Kautzman/Extender: [name] in Treatment: 20 Encounter Discharge Information Items Discharge Condition: Stable Ambulatory Status: Wheelchair Discharge Destination: Home Transportation: Private Auto Accompanied By: self Schedule Follow-up Appointment: No Clinical Summary of Care: Electronic Signature(s) Signed: 09/07/2022 11:44:52 AM By: [name] CHT EMT BS , , Entered By: [name] on 09/07/2022 11:44:52 -------------------------------------------------------------------------------- Vitals Details Patient Name: Date of Service: [name], [name] 09/07/2022 8:00 [name] Medical Record Number: [id number] Patient Account Number: [id number] Date of Birth/Sex: Treating RN: 1956-05-12 (67 y.o. [name] Primary Care [name]: [name] Other Clinician: [name] Referring Arik Husmann: Treating Rody Keadle/Extender: [name], [name] in Treatment: 20 Vital Signs Time Taken: 07:40 Temperature (F): 98.2 YOSBEL, BITTING [name] ([id number]) [id number] Page 2 of 2 Height (in): 72 Pulse (bpm): 87 Weight (lbs): 220 Respiratory Rate (breaths/min): 18 Body Mass Index (BMI): 29.8 Blood Pressure (mmHg): 158/86 Capillary Blood Glucose (mg/dl): 199 Reference Range: 80 - 120 mg / dl Electronic Signature(s) Signed: 09/07/2022 10:49:55 AM By: [name] CHT EMT BS , , Entered By: [name] on 09/07/2022 10:49:54

## 2022-09-08 NOTE — Progress Notes (Signed)
KHALON, BEATLEY A ([id number]) [id number] Page 1 of 1 Visit Report for 09/07/2022 SuperBill Details Patient Name: Date of Service: [name], [name] 09/07/2022 Medical Record Number: [id number] Patient Account Number: [id number] Date of Birth/Sex: Treating RN: [birth year] (67 y.o. [name], [name] Primary Care Provider: Roselee [name] Other Clinician: [name] Referring Provider: Treating Provider/Extender: [name], [name] in Treatment: 20 Diagnosis Coding ICD-10 Codes Code Description [phone] Non-pressure chronic ulcer of other part of left foot with fat layer exposed M86.172 Other acute osteomyelitis, left ankle and foot L97.528 Non-pressure chronic ulcer of other part of left foot with other specified severity E11.621 Type 2 diabetes mellitus with foot ulcer E11.42 Type 2 diabetes mellitus with diabetic polyneuropathy S91.301A Unspecified open wound, right foot, initial encounter 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 severity Facility Procedures CPT4 Code Description Modifier Quantity [id number]-(Facility Use Only) HBOT full body chamber, 54mn , 4 ICD-10 Diagnosis Description E11.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 M86.172 Other acute osteomyelitis, left ankle and foot Physician Procedures Quantity CPT4 Code Description Modifier [id number] - WC PHYS HYPERBARIC OXYGEN THERAPY 1 ICD-10 Diagnosis Description E11.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 M86.172 Other acute osteomyelitis, left ankle and  foot Electronic Signature(s) Signed: 09/07/2022 11:43:05 AM By: SDonavan BurnetCHT EMT BS , , Signed: 09/07/2022 3:21:07 PM By: [name] Entered By: SDonavan Burneton 09/07/2022 11:43:04

## 2022-09-09 ENCOUNTER — Encounter (HOSPITAL_COMMUNITY): Payer: Self-pay | Admitting: Vascular Surgery

## 2022-09-09 ENCOUNTER — Encounter (HOSPITAL_COMMUNITY)
Admission: RE | Disposition: A | Discharge status: Home / Self Care | Payer: Self-pay | Source: Home / Self Care | Attending: Vascular Surgery

## 2022-09-09 ENCOUNTER — Encounter (HOSPITAL_BASED_OUTPATIENT_CLINIC_OR_DEPARTMENT_OTHER): Payer: Medicare HMO | Admitting: Internal Medicine

## 2022-09-09 ENCOUNTER — Ambulatory Visit (HOSPITAL_COMMUNITY)
Admission: RE | Admit: 2022-09-09 | Discharge: 2022-09-09 | Disposition: A | Discharge status: Home / Self Care | Payer: Medicare HMO | Attending: Vascular Surgery | Admitting: Vascular Surgery

## 2022-09-09 ENCOUNTER — Other Ambulatory Visit: Payer: Self-pay

## 2022-09-09 DIAGNOSIS — I70245 Atherosclerosis of native arteries of left leg with ulceration of other part of foot: Secondary | ICD-10-CM | POA: Diagnosis not present

## 2022-09-09 DIAGNOSIS — L97529 Non-pressure chronic ulcer of other part of left foot with unspecified severity: Secondary | ICD-10-CM | POA: Diagnosis not present

## 2022-09-09 DIAGNOSIS — E1151 Type 2 diabetes mellitus with diabetic peripheral angiopathy without gangrene: Secondary | ICD-10-CM | POA: Insufficient documentation

## 2022-09-09 DIAGNOSIS — Z87891 Personal history of nicotine dependence: Secondary | ICD-10-CM | POA: Insufficient documentation

## 2022-09-09 DIAGNOSIS — I70235 Atherosclerosis of native arteries of right leg with ulceration of other part of foot: Secondary | ICD-10-CM | POA: Diagnosis not present

## 2022-09-09 DIAGNOSIS — E11621 Type 2 diabetes mellitus with foot ulcer: Secondary | ICD-10-CM | POA: Diagnosis not present

## 2022-09-09 DIAGNOSIS — I70221 Atherosclerosis of native arteries of extremities with rest pain, right leg: Secondary | ICD-10-CM

## 2022-09-09 DIAGNOSIS — L97519 Non-pressure chronic ulcer of other part of right foot with unspecified severity: Secondary | ICD-10-CM | POA: Insufficient documentation

## 2022-09-09 HISTORY — PX: ABDOMINAL AORTOGRAM W/LOWER EXTREMITY: CATH118223

## 2022-09-09 HISTORY — PX: PERIPHERAL VASCULAR INTERVENTION: CATH118257

## 2022-09-09 LAB — POCT I-STAT, CHEM 8
BUN: 7 mg/dL — ABNORMAL LOW (ref 8–23)
Calcium, Ion: 1.15 mmol/L (ref 1.15–1.40)
Chloride: 104 mmol/L (ref 98–111)
Creatinine, Ser: 0.8 mg/dL (ref 0.61–1.24)
Glucose, Bld: 108 mg/dL — ABNORMAL HIGH (ref 70–99)
HCT: 43 % (ref 39.0–52.0)
Hemoglobin: 14.6 g/dL (ref 13.0–17.0)
Potassium: 3.7 mmol/L (ref 3.5–5.1)
Sodium: 141 mmol/L (ref 135–145)
TCO2: 27 mmol/L (ref 22–32)

## 2022-09-09 LAB — GLUCOSE, CAPILLARY: Glucose-Capillary: 121 mg/dL — ABNORMAL HIGH (ref 70–99)

## 2022-09-09 SURGERY — ABDOMINAL AORTOGRAM W/LOWER EXTREMITY
Anesthesia: LOCAL

## 2022-09-09 MED ORDER — SODIUM CHLORIDE 0.9 % IV SOLN: 250.0000 mL | INTRAVENOUS | Status: DC | PRN

## 2022-09-09 MED ORDER — HEPARIN (PORCINE) IN NACL 1000-0.9 UT/500ML-% IV SOLN
INTRAVENOUS | Status: DC | PRN
  Administered 2022-09-09 (×2): 500 mL

## 2022-09-09 MED ORDER — LIDOCAINE HCL (PF) 1 % IJ SOLN
INTRAMUSCULAR | Status: DC | PRN
  Administered 2022-09-09: 15 mL

## 2022-09-09 MED ORDER — FENTANYL CITRATE (PF) 100 MCG/2ML IJ SOLN
INTRAMUSCULAR | Status: AC
  Filled 2022-09-09: qty 2

## 2022-09-09 MED ORDER — HYDRALAZINE HCL 20 MG/ML IJ SOLN
INTRAMUSCULAR | Status: DC | PRN
  Administered 2022-09-09 (×2): 10 mg via INTRAVENOUS

## 2022-09-09 MED ORDER — SODIUM CHLORIDE 0.9 % IV SOLN: INTRAVENOUS | Status: DC

## 2022-09-09 MED ORDER — MIDAZOLAM HCL 5 MG/5ML IJ SOLN
INTRAMUSCULAR | Status: AC
  Filled 2022-09-09: qty 5

## 2022-09-09 MED ORDER — MIDAZOLAM HCL 2 MG/2ML IJ SOLN
INTRAMUSCULAR | Status: DC | PRN
  Administered 2022-09-09 (×2): 1 mg via INTRAVENOUS

## 2022-09-09 MED ORDER — IODIXANOL 320 MG/ML IV SOLN
INTRAVENOUS | Status: DC | PRN
  Administered 2022-09-09: 160 mL via INTRA_ARTERIAL

## 2022-09-09 MED ORDER — SODIUM CHLORIDE 0.9 % WEIGHT BASED INFUSION: 1.0000 mL/kg/h | INTRAVENOUS | Status: DC

## 2022-09-09 MED ORDER — FENTANYL CITRATE (PF) 100 MCG/2ML IJ SOLN
INTRAMUSCULAR | Status: DC | PRN
  Administered 2022-09-09 (×2): 50 ug via INTRAVENOUS
  Administered 2022-09-09: 25 ug via INTRAVENOUS

## 2022-09-09 MED ORDER — HEPARIN SODIUM (PORCINE) 1000 UNIT/ML IJ SOLN
INTRAMUSCULAR | Status: DC | PRN
  Administered 2022-09-09: 8000 [IU] via INTRAVENOUS

## 2022-09-09 MED ORDER — LABETALOL HCL 5 MG/ML IV SOLN
INTRAVENOUS | Status: DC | PRN
  Administered 2022-09-09: 20 mg via INTRAVENOUS

## 2022-09-09 MED ORDER — SODIUM CHLORIDE 0.9% FLUSH: 3.0000 mL | INTRAVENOUS | Status: DC | PRN

## 2022-09-09 MED ORDER — HYDRALAZINE HCL 20 MG/ML IJ SOLN: 5.0000 mg | INTRAMUSCULAR | Status: DC | PRN

## 2022-09-09 MED ORDER — LIDOCAINE HCL (PF) 1 % IJ SOLN
INTRAMUSCULAR | Status: AC
  Filled 2022-09-09: qty 30

## 2022-09-09 MED ORDER — ONDANSETRON HCL 4 MG/2ML IJ SOLN: 4.0000 mg | Freq: Four times a day (QID) | INTRAMUSCULAR | Status: DC | PRN

## 2022-09-09 MED ORDER — LABETALOL HCL 5 MG/ML IV SOLN
INTRAVENOUS | Status: AC
  Filled 2022-09-09: qty 4

## 2022-09-09 MED ORDER — ACETAMINOPHEN 325 MG PO TABS: 650.0000 mg | ORAL_TABLET | ORAL | Status: DC | PRN

## 2022-09-09 MED ORDER — LABETALOL HCL 5 MG/ML IV SOLN: 10.0000 mg | INTRAVENOUS | Status: DC | PRN

## 2022-09-09 MED ORDER — SODIUM CHLORIDE 0.9% FLUSH: 3.0000 mL | Freq: Two times a day (BID) | INTRAVENOUS | Status: DC

## 2022-09-09 SURGICAL SUPPLY — 17 items
BALLN MUSTANG 5X60X75 (BALLOONS) ×2
BALLOON MUSTANG 5X60X75 (BALLOONS) IMPLANT
CATH OMNI FLUSH 5F 65CM (CATHETERS) IMPLANT
CLOSURE PERCLOSE PROSTYLE (VASCULAR PRODUCTS) IMPLANT
GLIDEWIRE ADV .035X260CM (WIRE) IMPLANT
KIT MICROPUNCTURE NIT STIFF (SHEATH) IMPLANT
KIT PV (KITS) ×2 IMPLANT
SHEATH CATAPULT 6FR 60 (SHEATH) IMPLANT
SHEATH PINNACLE 5F 10CM (SHEATH) IMPLANT
SHEATH PROBE COVER 6X72 (BAG) IMPLANT
STENT ELUVIA 6X60X130 (Permanent Stent) IMPLANT
STOPCOCK MORSE 400PSI 3WAY (MISCELLANEOUS) IMPLANT
SYR MEDRAD MARK 7 150ML (SYRINGE) ×2 IMPLANT
TRANSDUCER W/STOPCOCK (MISCELLANEOUS) ×2 IMPLANT
TRAY PV CATH (CUSTOM PROCEDURE TRAY) ×2 IMPLANT
TUBING CIL FLEX 10 FLL-RA (TUBING) IMPLANT
WIRE BENTSON .035X145CM (WIRE) IMPLANT

## 2022-09-09 NOTE — Progress Notes (Signed)
Office Note   Patient seen and examined in preop holding.  No complaints. No changes to medication history or physical exam since last seen in clinic. After discussing the risks and benefits of right leg angiogram for assisted primary patency of his right leg bypass, [name] elected to proceed.   [name] MD   CC:  follow up Requesting Provider:  No ref. provider found  HPI: [name] is a 67 y.o. (06-13-1956) male well-known to me who has severe peripheral arterial disease in bilateral lower extremities.    Surgical history includes: Right below-knee pop to posterior tibial artery bypass using vein, with subsequent balloon angioplasty for assisted primary patency at retained valve. Right TMA. 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  At his last visit, the right TMA had completely healed, and the left was closed, however both now have ulcerations (left greater than right).  I did not look at these today as they were covered, and he has to keep in this way however he is currently going to hyperbaric therapy 5 days a week to a depth of roughly 50 feet.  He states the left-sided TMA wound is healing well.    Past Medical History:  Diagnosis Date   Diabetes mellitus with peripheral vascular disease    Hypertension    Neuropathy    PTSD (post-traumatic stress disorder)    Sleep apnea     Past Surgical History:  Procedure Laterality Date   ABDOMINAL AORTOGRAM W/LOWER EXTREMITY Right 09/10/2021   Procedure: ABDOMINAL AORTOGRAM W/LOWER EXTREMITY;  Surgeon: [name], MD;  Location: [facility];  Service: Cardiovascular;  Laterality: Right;   ABDOMINAL AORTOGRAM W/LOWER EXTREMITY N/A 12/17/2021   Procedure: ABDOMINAL AORTOGRAM W/LOWER EXTREMITY;  Surgeon: [name], MD;  Location: [facility];  Service: Cardiovascular;  Laterality: N/A;   ABDOMINAL AORTOGRAM W/LOWER EXTREMITY N/A 12/03/2021   Procedure: ABDOMINAL AORTOGRAM W/LOWER EXTREMITY;  Surgeon: [name], MD;  Location: [facility];  Service: Cardiovascular;  Laterality: N/A;   ABDOMINAL HERNIA REPAIR     AMPUTATION Right 09/19/2021   Procedure: TRANSMETATARSAL AMPUTATION FOOT;  Surgeon: Lorenda Peck, MD;  Location: [facility];  Service: Podiatry;  Laterality: Right;  Surgical team to do local block   AMPUTATION Left 12/20/2021   Procedure: LEFT FOOT REVISION PARTIAL FIRST RAY AMPUTATION WITH PLACEMENT OF PRESSURE WOUND VAC;  Surgeon: [name], DPM;  Location: [facility];  Service: Podiatry;  Laterality: Left;   AMPUTATION TOE Right 09/15/2021   Procedure: AMPUTATION 4th TOE;  Surgeon: [name], MD;  Location: [facility];  Service: Vascular;  Laterality: Right;   AMPUTATION TOE Left 12/18/2021   Procedure: AMPUTATION LEFT BIG TOE; LEFT 5TH RAY PARTIAL AMPUTATION;  Surgeon: [name], DPM;  Location: [facility];  Service: Podiatry;  Laterality: Left;   BACK SURGERY     CORONARY ATHERECTOMY N/A 12/08/2021   Procedure: CORONARY ATHERECTOMY;  Surgeon: Waynetta Sandy, MD;  Location: [facility];  Service: Cardiovascular;  Laterality: N/A;  Laser - Lt. PT   FEMORAL-TIBIAL BYPASS GRAFT Right 09/15/2021   Procedure: BYPASS GRAFT BELOW KNEE POPLITEAL ARTERY TO POSTERIOR TIBIAL ARTERY;  Surgeon: [name], MD;  Location: [facility];  Service: Vascular;  Laterality: Right;   IRRIGATION AND DEBRIDEMENT FOOT Right 09/17/2021  Procedure: IRRIGATION AND DEBRIDEMENT FOOT;  Surgeon: Lorenda Peck, MD;  Location: [facility];  Service: Podiatry;  Laterality: Right;  Suregeon will do anesthesia block   KNEE SURGERY     LOWER EXTREMITY ANGIOGRAPHY Left 12/08/2021   Procedure: Lower Extremity Angiography;  Surgeon: Waynetta Sandy, MD;  Location: [facility];  Service: Cardiovascular;   Laterality: Left;   PERIPHERAL VASCULAR BALLOON ANGIOPLASTY  09/10/2021   Procedure: PERIPHERAL VASCULAR BALLOON ANGIOPLASTY;  Surgeon: [name], MD;  Location: [facility];  Service: Cardiovascular;;  rt sfa pta   PERIPHERAL VASCULAR BALLOON ANGIOPLASTY Right 12/03/2021   Procedure: PERIPHERAL VASCULAR BALLOON ANGIOPLASTY;  Surgeon: [name], MD;  Location: [facility];  Service: Cardiovascular;  Laterality: Right;   PERIPHERAL VASCULAR INTERVENTION Left 12/08/2021   Procedure: PERIPHERAL VASCULAR INTERVENTION;  Surgeon: Waynetta Sandy, MD;  Location: [facility];  Service: Cardiovascular;  Laterality: Left;  LT. SFA   SHOULDER SURGERY     VEIN HARVEST Right 09/15/2021   Procedure: GREATER SAPHENOUS VEIN HARVEST;  Surgeon: [name], MD;  Location: [facility];  Service: Vascular;  Laterality: Right;    Social History   Socioeconomic History   Marital status: Married    Spouse name: Not on file   Number of children: Not on file   Years of education: Not on file   Highest education level: Not on file  Occupational History   Not on file  Tobacco Use   Smoking status: Former    Types: Cigarettes    Passive exposure: Never   Smokeless tobacco: Never  Vaping Use   Vaping Use: Never used  Substance and Sexual Activity   Alcohol use: No   Drug use: Never   Sexual activity: Not on file  Other Topics Concern   Not on file  Social History Narrative   Not on file   Social Determinants of Health   Financial Resource Strain: Not on file  Food Insecurity: Not on file  Transportation Needs: Not on file  Physical Activity: Not on file  Stress: Not on file  Social Connections: Not on file  Intimate Partner Violence: Not on file    Family History  Problem Relation Age of Onset   Stroke Mother    Coronary artery disease Father    COPD Brother     Current Facility-Administered Medications  Medication Dose Route Frequency Provider Last  Rate Last Admin   0.9 %  sodium chloride infusion   Intravenous Continuous [name], MD 100 mL/hr at 09/09/22 1103 New Bag at 09/09/22 1103    Allergies  Allergen Reactions   Gemfibrozil Other (See Comments)    Doesn't know its been awhile   Oxycontin [Oxycodone Hcl] Other (See Comments)    Bad reaction "took me to a place I never wanna go again"   Pork-Derived Products    Simvastatin Other (See Comments)    Says he doesn't know its been awhile     REVIEW OF SYSTEMS:   '[X]'$  denotes positive finding, '[ ]'$  denotes negative finding Cardiac  Comments:  Chest pain or chest pressure:    Shortness of breath upon exertion:    Short of breath when lying flat:    Irregular heart rhythm:        Vascular    Pain in calf, thigh, or hip brought on by ambulation:    Pain in feet at night that wakes you up from your sleep:  Blood clot in your veins:    Leg swelling:         Pulmonary    Oxygen at home:    Productive cough:     Wheezing:         Neurologic    Sudden weakness in arms or legs:     Sudden numbness in arms or legs:     Sudden onset of difficulty speaking or slurred speech:    Temporary loss of vision in one eye:     Problems with dizziness:         Gastrointestinal    Blood in stool:     Vomited blood:         Genitourinary    Burning when urinating:     Blood in urine:        Psychiatric    Major depression:         Hematologic    Bleeding problems:    Problems with blood clotting too easily:        Skin    Rashes or ulcers:        Constitutional    Fever or chills:      PHYSICAL EXAMINATION:  Vitals:   09/09/22 1014 09/09/22 1216  BP: (!) 156/79   Pulse: 77   Resp: 20   Temp: (!) 97 F (36.1 C)   TempSrc: Temporal   SpO2: 95% 96%  Weight: 103 kg   Height: 6' (1.829 m)      General:  WDWN in NAD; vital signs documented above Gait: Not observed HENT: WNL, normocephalic Pulmonary: normal non-labored breathing , without Rales,  rhonchi,  wheezing Cardiac: regular HR Abdomen: soft, NT, no masses Skin: without rashes Vascular Exam/Pulses: Excellent Doppler signals bilaterally. Extremities: Feet bandaged. Musculoskeletal: no muscle wasting or atrophy  Neurologic: A&O X 3;  No focal weakness or paresthesias are detected Psychiatric:  The pt has Normal affect.   Non-Invasive Vascular Imaging:     +--------+--------+-----+---------------+----------+--------+  RIGHT  PSV cm/sRatioStenosis       Waveform  Comments  +--------+--------+-----+---------------+----------+--------+  CFA Prox150                         triphasic           +--------+--------+-----+---------------+----------+--------+  DFA    113                         biphasic            +--------+--------+-----+---------------+----------+--------+  SFA Prox137                         biphasic            +--------+--------+-----+---------------+----------+--------+  SFA Mid 407          75-99% stenosismonophasicbrisk     +--------+--------+-----+---------------+----------+--------+       Right Graft #1: BK - PTA  +------------------+--------+--------+----------+--------+                   PSV cm/sStenosisWaveform  Comments  +------------------+--------+--------+----------+--------+  Inflow                                               +------------------+--------+--------+----------+--------+  Prox Anastomosis  147  monophasic          +------------------+--------+--------+----------+--------+  Proximal Graft    144             monophasic          +------------------+--------+--------+----------+--------+  Mid Graft         125             monophasic          +------------------+--------+--------+----------+--------+  Distal Graft      88              monophasic          +------------------+--------+--------+----------+--------+  Distal Anastomosis                           NV        +------------------+--------+--------+----------+--------+  Outflow                                    NV        +------------------+--------+--------+----------+--------+        +----------+--------+-----+--------+----------+--------+  LEFT     PSV cm/sRatioStenosisWaveform  Comments  +----------+--------+-----+--------+----------+--------+  EIA Distal291                  biphasic            +----------+--------+-----+--------+----------+--------+  SFA Distal169                  biphasic            +----------+--------+-----+--------+----------+--------+  POP Mid   99                   monophasic          +----------+--------+-----+--------+----------+--------+     Left Stent(s):  +---------------+--------+---------------+---------+--------+  SFA           PSV cm/sStenosis       Waveform Comments  +---------------+--------+---------------+---------+--------+  Prox to Stent  216                    triphasic          +---------------+--------+---------------+---------+--------+  Proximal Stent 303     50-99% stenosisbiphasic           +---------------+--------+---------------+---------+--------+  Mid Stent      141                    biphasic           +---------------+--------+---------------+---------+--------+  Distal Stent   147                    biphasic           +---------------+--------+---------------+---------+--------+  Distal to Stent110                    biphasic           +---------------+--------+---------------+---------+--------+   +-------+-----------+-----------+------------+------------+  ABI/TBIToday's ABIToday's TBIPrevious ABIPrevious TBI  +-------+-----------+-----------+------------+------------+  Right 0.98       amputated  0.83        amputated     +-------+-----------+-----------+------------+------------+  Left  0.62       amputated  0.68         amputated     +-------+-----------+-----------+------------+------------+   ASSESSMENT/PLAN:: 67 y.o. male here for follow up status post right BK pop-PT bypass, right lower extremity angiogram for assisted  bypass patency-retained valve lysis, left lower extremity SFA pop stenting, native posterior tibial artery angioplasty.  Relatively unchanged.   Serial duplex ultrasound demonstrated stable, elevated velocities at the left proximal SFA.  This has been imaged in the past with angiography demonstrating no issues.   The more concerning finding is right-sided superficial femoral artery stenosis with velocities near 400.  Although this is negative, I am concerned that this is threatening the right lower extremity bypass.  After discussing the risk and benefits of right lower extremity angiography in an effort to define, improve inflow for assisted primary patency of his bypass, [name] elected to proceed.    I asked that he continue his current medication regimen.  [name],  [facility] [phone]

## 2022-09-09 NOTE — Progress Notes (Signed)
WAKE, ACRE [name] ([name]) [id number] Page 1 of 2 Visit Report for 09/08/2022 Arrival Information Details Patient Name: Date of Service: [name], [name]. 09/08/2022 8:00 [name] Patient Account Number: [id number] Date of Birth/Sex: Treating RN: November 02, 1955 (67 y.o. [name] Primary Care Florentino [name]: [name] Other Clinician: [name] Referring [name]: Treating [name]/Extender: [name] in Treatment: 21 Visit Information History Since Last Visit All ordered tests and consults were completed: Yes Patient Arrived: Wheel Chair Added or deleted any medications: No Arrival Time: 07:30 Any new allergies or adverse reactions: No Accompanied By: self Had [name] fall or experienced change in No Transfer Assistance: None activities of daily living that may affect Patient Identification Verified: Yes risk of falls: Secondary Verification Process Completed: Yes Signs or symptoms of abuse/neglect since last visito No Patient Requires Transmission-Based Precautions: No Hospitalized since last visit: No Patient Has Alerts: Yes Implantable device outside of the clinic excluding No cellular tissue based products placed in the center since last visit: Pain Present Now: No Electronic Signature(s) Signed: 09/08/2022 10:36:19 AM By: [name] CHT EMT BS , , Entered By: [name] on 09/08/2022 10:36:19 -------------------------------------------------------------------------------- Encounter Discharge Information Details Patient Name: Date of Service: [name], [name]. 09/08/2022 8:00 [name] Patient Account Number: [id number] Date of Birth/Sex: Treating RN: [birth year] (67 y.o. [name] Primary Care [name]: [name] Other Clinician: [name] Referring [name]: Treating [name]/Extender: [name] in Treatment: 21 Encounter Discharge Information Items Discharge Condition: Stable Ambulatory Status: Wheelchair Discharge Destination: Home Transportation: Private Auto Accompanied By: self Schedule Follow-up Appointment: No Clinical Summary of Care: Electronic Signature(s) Signed: 09/08/2022 12:02:45 PM By: [name] CHT EMT BS , , Entered By: [name] on 09/08/2022 12:02:45 -------------------------------------------------------------------------------- [place] Details Patient Name: Date of Service: [name], [name]. 09/08/2022 8:00 [name] Patient Account Number: [id number] Date of Birth/Sex: Treating RN: 1956-05-13 (68 y.o. [name] Primary Care [name]: [name] Other Clinician: [name] Referring [name]: Treating [name]/Extender: [name] in Treatment: 21 Vital Signs Time Taken: 08:06 Temperature (F): 98.2 [name], [name] ([name]) [id number] Page 2 of 2 Height (in): 72 Pulse (bpm): 87 Weight (lbs): 220 Respiratory Rate (breaths/min): 18 Body Mass Index (BMI): 29.8 Blood Pressure (mmHg): 159/79 Capillary Blood Glucose (mg/dl): 196 Reference Range: 80 - 120 mg / dl Electronic Signature(s) Signed: 09/08/2022 11:55:25 AM By: [name] CHT EMT BS , , Entered By: [name] on 09/08/2022 11:55:24

## 2022-09-09 NOTE — H&P (Signed)
Office Note   Patient seen and examined in preop holding.  No complaints. No changes to medication history or physical exam since last seen in clinic. After discussing the risks and benefits of right leg angiogram for assisted primary patency of his right leg bypass, [name] elected to proceed.   [name] John MD   CC:  follow up Requesting Provider:  No ref. provider found  HPI: [name] is a 67 y.o. (29-May-1956) male well-known to me who has severe peripheral arterial disease in bilateral lower extremities.    Surgical history includes: Right below-knee pop to posterior tibial artery bypass using vein, with subsequent balloon angioplasty for assisted primary patency at retained valve. Right TMA. 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  At his last visit, the right TMA had completely healed, and the left was closed, however both now have ulcerations (left greater than right).  I did not look at these today as they were covered, and he has to keep in this way however he is currently going to hyperbaric therapy 5 days a week to a depth of roughly 50 feet.  He states the left-sided TMA wound is healing well.    Past Medical History:  Diagnosis Date   Diabetes mellitus with peripheral vascular disease    Hypertension    Neuropathy    PTSD (post-traumatic stress disorder)    Sleep apnea     Past Surgical History:  Procedure Laterality Date   ABDOMINAL AORTOGRAM W/LOWER EXTREMITY Right 09/10/2021   Procedure: ABDOMINAL AORTOGRAM W/LOWER EXTREMITY;  Surgeon: [name] John, MD;  Location: [facility];  Service: Cardiovascular;  Laterality: Right;   ABDOMINAL AORTOGRAM W/LOWER EXTREMITY N/A 12/17/2021   Procedure: ABDOMINAL AORTOGRAM W/LOWER EXTREMITY;  Surgeon: [name] John, MD;  Location: [facility];  Service: Cardiovascular;  Laterality: N/A;   ABDOMINAL AORTOGRAM W/LOWER EXTREMITY N/A 12/03/2021   Procedure: ABDOMINAL AORTOGRAM W/LOWER EXTREMITY;  Surgeon: [name] John, MD;  Location: [facility];  Service: Cardiovascular;  Laterality: N/A;   ABDOMINAL HERNIA REPAIR     AMPUTATION Right 09/19/2021   Procedure: TRANSMETATARSAL AMPUTATION FOOT;  Surgeon: Lorenda Peck, MD;  Location: [facility];  Service: Podiatry;  Laterality: Right;  Surgical team to do local block   AMPUTATION Left 12/20/2021   Procedure: LEFT FOOT REVISION PARTIAL FIRST RAY AMPUTATION WITH PLACEMENT OF PRESSURE WOUND VAC;  Surgeon: [name], DPM;  Location: [facility];  Service: Podiatry;  Laterality: Left;   AMPUTATION TOE Right 09/15/2021   Procedure: AMPUTATION 4th TOE;  Surgeon: [name] John, MD;  Location: [facility];  Service: Vascular;  Laterality: Right;   AMPUTATION TOE Left 12/18/2021   Procedure: AMPUTATION LEFT BIG TOE; LEFT 5TH RAY PARTIAL AMPUTATION;  Surgeon: [name], DPM;  Location: [facility];  Service: Podiatry;  Laterality: Left;   BACK SURGERY     CORONARY ATHERECTOMY N/A 12/08/2021   Procedure: CORONARY ATHERECTOMY;  Surgeon: Waynetta Sandy, MD;  Location: [facility];  Service: Cardiovascular;  Laterality: N/A;  Laser - Lt. PT   FEMORAL-TIBIAL BYPASS GRAFT Right 09/15/2021   Procedure: BYPASS GRAFT BELOW KNEE POPLITEAL ARTERY TO POSTERIOR TIBIAL ARTERY;  Surgeon: [name] John, MD;  Location: [facility];  Service: Vascular;  Laterality: Right;   IRRIGATION AND DEBRIDEMENT FOOT Right 09/17/2021  Procedure: IRRIGATION AND DEBRIDEMENT FOOT;  Surgeon: Lorenda Peck, MD;  Location: [facility];  Service: Podiatry;  Laterality: Right;  Suregeon will do anesthesia block   KNEE SURGERY     LOWER EXTREMITY ANGIOGRAPHY Left 12/08/2021   Procedure: Lower Extremity Angiography;  Surgeon: Waynetta Sandy, MD;  Location: [facility];  Service: Cardiovascular;   Laterality: Left;   PERIPHERAL VASCULAR BALLOON ANGIOPLASTY  09/10/2021   Procedure: PERIPHERAL VASCULAR BALLOON ANGIOPLASTY;  Surgeon: [name] John, MD;  Location: [facility];  Service: Cardiovascular;;  rt sfa pta   PERIPHERAL VASCULAR BALLOON ANGIOPLASTY Right 12/03/2021   Procedure: PERIPHERAL VASCULAR BALLOON ANGIOPLASTY;  Surgeon: [name] John, MD;  Location: [facility];  Service: Cardiovascular;  Laterality: Right;   PERIPHERAL VASCULAR INTERVENTION Left 12/08/2021   Procedure: PERIPHERAL VASCULAR INTERVENTION;  Surgeon: Waynetta Sandy, MD;  Location: [facility];  Service: Cardiovascular;  Laterality: Left;  LT. SFA   SHOULDER SURGERY     VEIN HARVEST Right 09/15/2021   Procedure: GREATER SAPHENOUS VEIN HARVEST;  Surgeon: [name] John, MD;  Location: [facility];  Service: Vascular;  Laterality: Right;    Social History   Socioeconomic History   Marital status: Married    Spouse name: Not on file   Number of children: Not on file   Years of education: Not on file   Highest education level: Not on file  Occupational History   Not on file  Tobacco Use   Smoking status: Former    Types: Cigarettes    Passive exposure: Never   Smokeless tobacco: Never  Vaping Use   Vaping Use: Never used  Substance and Sexual Activity   Alcohol use: No   Drug use: Never   Sexual activity: Not on file  Other Topics Concern   Not on file  Social History Narrative   Not on file   Social Determinants of Health   Financial Resource Strain: Not on file  Food Insecurity: Not on file  Transportation Needs: Not on file  Physical Activity: Not on file  Stress: Not on file  Social Connections: Not on file  Intimate Partner Violence: Not on file    Family History  Problem Relation Age of Onset   Stroke Mother    Coronary artery disease Father    COPD Brother     Current Facility-Administered Medications  Medication Dose Route Frequency Provider Last  Rate Last Admin   0.9 %  sodium chloride infusion   Intravenous Continuous [name] John, MD 100 mL/hr at 09/09/22 1103 New Bag at 09/09/22 1103    Allergies  Allergen Reactions   Gemfibrozil Other (See Comments)    Doesn't know its been awhile   Oxycontin [Oxycodone Hcl] Other (See Comments)    Bad reaction "took me to a place I never wanna go again"   Pork-Derived Products    Simvastatin Other (See Comments)    Says he doesn't know its been awhile     REVIEW OF SYSTEMS:   '[X]'$  denotes positive finding, '[ ]'$  denotes negative finding Cardiac  Comments:  Chest pain or chest pressure:    Shortness of breath upon exertion:    Short of breath when lying flat:    Irregular heart rhythm:        Vascular    Pain in calf, thigh, or hip brought on by ambulation:    Pain in feet at night that wakes you up from your sleep:  Blood clot in your veins:    Leg swelling:         Pulmonary    Oxygen at home:    Productive cough:     Wheezing:         Neurologic    Sudden weakness in arms or legs:     Sudden numbness in arms or legs:     Sudden onset of difficulty speaking or slurred speech:    Temporary loss of vision in one eye:     Problems with dizziness:         Gastrointestinal    Blood in stool:     Vomited blood:         Genitourinary    Burning when urinating:     Blood in urine:        Psychiatric    Major depression:         Hematologic    Bleeding problems:    Problems with blood clotting too easily:        Skin    Rashes or ulcers:        Constitutional    Fever or chills:      PHYSICAL EXAMINATION:  Vitals:   09/09/22 1014 09/09/22 1216  BP: (!) 156/79   Pulse: 77   Resp: 20   Temp: (!) 97 F (36.1 C)   TempSrc: Temporal   SpO2: 95% 96%  Weight: 103 kg   Height: 6' (1.829 m)      General:  WDWN in NAD; vital signs documented above Gait: Not observed HENT: WNL, normocephalic Pulmonary: normal non-labored breathing , without Rales,  rhonchi,  wheezing Cardiac: regular HR Abdomen: soft, NT, no masses Skin: without rashes Vascular Exam/Pulses: Excellent Doppler signals bilaterally. Extremities: Feet bandaged. Musculoskeletal: no muscle wasting or atrophy  Neurologic: A&O X 3;  No focal weakness or paresthesias are detected Psychiatric:  The pt has Normal affect.   Non-Invasive Vascular Imaging:     +--------+--------+-----+---------------+----------+--------+  RIGHT  PSV cm/sRatioStenosis       Waveform  Comments  +--------+--------+-----+---------------+----------+--------+  CFA Prox150                         triphasic           +--------+--------+-----+---------------+----------+--------+  DFA    113                         biphasic            +--------+--------+-----+---------------+----------+--------+  SFA Prox137                         biphasic            +--------+--------+-----+---------------+----------+--------+  SFA Mid 407          75-99% stenosismonophasicbrisk     +--------+--------+-----+---------------+----------+--------+       Right Graft #1: BK - PTA  +------------------+--------+--------+----------+--------+                   PSV cm/sStenosisWaveform  Comments  +------------------+--------+--------+----------+--------+  Inflow                                               +------------------+--------+--------+----------+--------+  Prox Anastomosis  147  monophasic          +------------------+--------+--------+----------+--------+  Proximal Graft    144             monophasic          +------------------+--------+--------+----------+--------+  Mid Graft         125             monophasic          +------------------+--------+--------+----------+--------+  Distal Graft      88              monophasic          +------------------+--------+--------+----------+--------+  Distal Anastomosis                           NV        +------------------+--------+--------+----------+--------+  Outflow                                    NV        +------------------+--------+--------+----------+--------+        +----------+--------+-----+--------+----------+--------+  LEFT     PSV cm/sRatioStenosisWaveform  Comments  +----------+--------+-----+--------+----------+--------+  EIA Distal291                  biphasic            +----------+--------+-----+--------+----------+--------+  SFA Distal169                  biphasic            +----------+--------+-----+--------+----------+--------+  POP Mid   99                   monophasic          +----------+--------+-----+--------+----------+--------+     Left Stent(s):  +---------------+--------+---------------+---------+--------+  SFA           PSV cm/sStenosis       Waveform Comments  +---------------+--------+---------------+---------+--------+  Prox to Stent  216                    triphasic          +---------------+--------+---------------+---------+--------+  Proximal Stent 303     50-99% stenosisbiphasic           +---------------+--------+---------------+---------+--------+  Mid Stent      141                    biphasic           +---------------+--------+---------------+---------+--------+  Distal Stent   147                    biphasic           +---------------+--------+---------------+---------+--------+  Distal to Stent110                    biphasic           +---------------+--------+---------------+---------+--------+   +-------+-----------+-----------+------------+------------+  ABI/TBIToday's ABIToday's TBIPrevious ABIPrevious TBI  +-------+-----------+-----------+------------+------------+  Right 0.98       amputated  0.83        amputated     +-------+-----------+-----------+------------+------------+  Left  0.62       amputated  0.68         amputated     +-------+-----------+-----------+------------+------------+   ASSESSMENT/PLAN:: 67 y.o. male here for follow up status post right BK pop-PT bypass, right lower extremity angiogram for assisted  bypass patency-retained valve lysis, left lower extremity SFA pop stenting, native posterior tibial artery angioplasty.  Relatively unchanged.   Serial duplex ultrasound demonstrated stable, elevated velocities at the left proximal SFA.  This has been imaged in the past with angiography demonstrating no issues.   The more concerning finding is right-sided superficial femoral artery stenosis with velocities near 400.  Although this is negative, I am concerned that this is threatening the right lower extremity bypass.  After discussing the risk and benefits of right lower extremity angiography in an effort to define, improve inflow for assisted primary patency of his bypass, [name] elected to proceed.    I asked that he continue his current medication regimen.  [name],  [facility] [phone]

## 2022-09-09 NOTE — Progress Notes (Signed)
KHYLIN, [name] ([id number]) [id number] Page 1 of 2 Visit Report for 09/03/2022 Arrival Information Details Patient Name: Date of Service: [name], [name] 09/03/2022 8:00 [name] Medical Record Number: [id number] Patient Account Number: [id number] Date of Birth/Sex: Treating RN: 1955/12/06 (67 y.o. [name] Primary Care Yesenia Locurto: [name] Other Clinician: [name] Referring Desyre Calma: Treating Nastasha Reising/Extender: [name] in Treatment: 20 Visit Information History Since Last Visit All ordered tests and consults were completed: Yes Patient Arrived: Wheel Chair Added or deleted any medications: No Arrival Time: 07:39 Any new allergies or adverse reactions: No Accompanied By: self Had [name] fall or experienced change in No Transfer Assistance: None activities of daily living that may affect Patient Identification Verified: Yes risk of falls: Secondary Verification Process Completed: Yes Signs or symptoms of abuse/neglect since last visito No Patient Requires Transmission-Based Precautions: No Hospitalized since last visit: No Patient Has Alerts: Yes Implantable device outside of the clinic excluding No cellular tissue based products placed in the center since last visit: Pain Present Now: No Electronic Signature(s) Signed: 09/03/2022 10:46:28 AM By: [name] CHT EMT BS , , Entered By: [name] on 09/03/2022 10:46:28 -------------------------------------------------------------------------------- Encounter Discharge Information Details Patient Name: Date of Service: [name], [name] 09/03/2022 8:00 [name] Medical Record Number: [id number] Patient Account Number: [id number] Date of Birth/Sex: Treating RN: [birth year] (67 y.o. [name] Primary Care Amjad Fikes: [name] Other Clinician: [name] Referring Shauntelle Jamerson: Treating Kyrstin Campillo/Extender: [name] in Treatment: 20 Encounter Discharge Information Items Discharge Condition: Stable Ambulatory Status: Wheelchair Discharge Destination: Other (Note Required) Transportation: Other Accompanied By: staff Schedule Follow-up Appointment: No Clinical Summary of Care: Notes Wound Care Encounter after HBOT today. Electronic Signature(s) Signed: 09/03/2022 10:55:42 AM By: [name] CHT EMT BS , , Previous Signature: 09/03/2022 10:55:23 AM Version By: [name] CHT EMT BS , , Entered By: [name] on 09/03/2022 10:55:42 -------------------------------------------------------------------------------- [place] Details Patient Name: Date of Service: [name], [name] 09/03/2022 8:00 [name] Medical Record Number: [id number] Patient Account Number: [id number] Date of Birth/Sex: Treating RN: Sep 25, 1955 (67 y.o. [name] Primary Care [name]: [name] Other Clinician: [name] Referring Nakari Bracknell: Treating Matalyn Nawaz/Extender: [name] in Treatment: 20 [name], [name] [name] ([id number]) [id number] Page 2 of 2 Vital Signs Time Taken: 08:00 Temperature (F): 98.9 Height (in): 72 Pulse (bpm): 96 Weight (lbs): 220 Respiratory Rate (breaths/min): 16 Body Mass Index (BMI): 29.8 Blood Pressure (mmHg): 139/96 Capillary Blood Glucose (mg/dl): 225 Reference Range: 80 - 120 mg / dl Electronic Signature(s) Signed: 09/03/2022 10:46:54 AM By: [name] CHT EMT BS , , Entered By: [name] on 09/03/2022 10:46:54

## 2022-09-09 NOTE — Progress Notes (Signed)
RHYSE, HOUGEN A ([id number]) [id number] Page 1 of 1 Visit Report for 09/03/2022 [place] Details Patient Name: Date of Service: [name], [name] 09/03/2022 Medical Record Number: [id number] Patient Account Number: [id number] Date of Birth/Sex: Treating RN: 1956/06/10 (67 y.o. [name] Primary Care Provider: [name] Other Clinician: [name] Referring Provider: Treating Provider/Extender: [name], [name] in Treatment: 20 Diagnosis Coding ICD-10 Codes Code Description [phone] Non-pressure chronic ulcer of other part of left foot with fat layer exposed M86.172 Other acute osteomyelitis, left ankle and foot L97.528 Non-pressure chronic ulcer of other part of left foot with other specified severity E11.621 Type 2 diabetes mellitus with foot ulcer E11.42 Type 2 diabetes mellitus with diabetic polyneuropathy S91.301A Unspecified open wound, right foot, initial encounter 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 severity Facility Procedures CPT4 Code Description Modifier Quantity [id number] G0277-(Facility Use Only) HBOT full body chamber, 70mn , 4 ICD-10 Diagnosis Description E11.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 M86.172 Other acute osteomyelitis, left ankle and foot Physician Procedures Quantity CPT4 Code Description Modifier [id number] - WC PHYS HYPERBARIC OXYGEN THERAPY 1 ICD-10 Diagnosis Description E11.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 M86.172 Other acute osteomyelitis, left ankle and  foot Electronic Signature(s) Signed: 09/03/2022 10:54:58 AM By: SDonavan BurnetCHT EMT BS , , Signed: 09/03/2022 12:51:55 PM By: HKalman ShanDO Entered By: SDonavan Burneton 09/03/2022 10:54:57

## 2022-09-09 NOTE — Progress Notes (Signed)
KAYHAN, SOUFFRONT A ([id number]) [id number] Page 1 of 1 Visit Report for 09/08/2022 Super[place] Details Patient Name: Date of Service: [name], [name] A. 09/08/2022 Medical Record Number: [id number] Patient Account Number: [id number] Date of Birth/Sex: Treating RN: 1956/05/19 (67 y.o. [name] Primary Care Provider: [name] Other Clinician: [name] Referring Provider: Treating Provider/Extender: [name] in Treatment: 21 Diagnosis Coding ICD-10 Codes Code Description [phone] Non-pressure chronic ulcer of other part of left foot with fat layer exposed M86.172 Other acute osteomyelitis, left ankle and foot L97.528 Non-pressure chronic ulcer of other part of left foot with other specified severity E11.621 Type 2 diabetes mellitus with foot ulcer E11.42 Type 2 diabetes mellitus with diabetic polyneuropathy S91.301A Unspecified open wound, right foot, initial encounter 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 severity Facility Procedures CPT4 Code Description Modifier Quantity [id number] G0277-(Facility Use Only) HBOT full body chamber, 46mn , 4 ICD-10 Diagnosis Description E11.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 M86.172 Other acute osteomyelitis, left ankle and foot Physician Procedures Quantity CPT4 Code Description Modifier [id number] - WC PHYS HYPERBARIC OXYGEN THERAPY 1 ICD-10 Diagnosis Description E11.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 M86.172 Other acute osteomyelitis, left ankle and  foot Electronic Signature(s) Signed: 09/08/2022 12:02:02 PM By: SDonavan BurnetCHT EMT BS , , Signed: 09/08/2022 12:10:40 PM By: HKalman ShanDO Entered By: SDonavan Burneton 09/08/2022 12:02:01

## 2022-09-09 NOTE — Op Note (Signed)
Patient name: [name] MRN: [id number] DOB: 1955/10/08 Sex: male  09/09/2022 Pre-operative Diagnosis: Right lower extremity threatened below-knee pop to posterior tibial artery bypass Post-operative diagnosis:  Same Surgeon:  [name], MD Procedure Performed: 1.  Ultrasound-guided micropuncture access of the left common femoral artery 2.  Aortogram 3.  Second order cannulation, right lower extremity angiogram 4.  Drug-eluting stenting of the superficial femoral artery times two 6 x 60 mm, 6 x 60 mm postdilated 5 x 60 mm 5.  Moderate sedation time 56 minutes 6.  Contrast volume 160 mL 7.  Left lower extremity angiogram 8.  Device assisted closure-Pro-glide   Indications: Patient is a 67 year old male well-known to my practice having undergone interventions on bilateral lower extremities for peripheral arterial disease.  He currently has wounds on bilateral TMA's and is undergoing hyperbaric oxygen therapy.  His most recent visit, there were elevated velocities in his right superficial femoral artery threatening his lower extremity bypass.  After discussing the risk and benefits of right lower extremity angiogram in an effort to define and improve perfusion for primary assisted patency of his right-sided below-knee popliteal to posterior tibial artery bypass, Johnjoseph elected to proceed.   Findings:  Aortogram: No flow-limiting stenosis appreciated in the aortoiliac segments bilaterally On the right: Widely patent common femoral artery, profunda, superficial femoral artery with a proximal lesion roughly 6 cm from its ostia that appeared atypical, this did not appear flow-limiting but there was stenosis present.  More distally, there was a 70% focal stenosis of the superficial femoral artery.  No defined tibial arteries other than the below-knee popliteal bypass which was sewn to the distal posterior tibial artery.  This demonstrated no stenosis.  In the foot both medial and lateral  plantar branches filled.  Small vessel disease appreciated.  On the left: Widely patent common femoral artery, widely patent profunda.  The superficial femoral artery stents extend into the common femoral artery.  There is no flow-limiting stenosis along the entirety of the stents.  The popliteal artery is patent.  Distally, outflow is through a large network of collaterals with reconstitution of the posterior tibial artery at the mid tibia.  Medial and lateral plantar branches filling the foot.  Small vessel disease appreciated.    Procedure:  The patient was identified in the holding area and taken to room 8.  The patient was then placed supine on the table and prepped and draped in the usual sterile fashion.  A time out was called.  Ultrasound was used to evaluate the left common femoral artery.  It was patent .  A digital ultrasound image was acquired.  A micropuncture needle was used to access the left common femoral artery under ultrasound guidance.  An 018 wire was advanced without resistance and a micropuncture sheath was placed.  The 018 wire was removed and a benson wire was placed.  The micropuncture sheath was exchanged for a 5 french sheath.  An omniflush catheter was advanced over the wire to the level of L-1.  An abdominal angiogram was obtained.  Next, using the omniflush catheter and a benson wire, the aortic bifurcation was crossed and the catheter was placed into theright external iliac artery and right runoff was obtained.  left runoff was performed via retrograde sheath injections.  See above for results.  I elected to intervene on the right-sided superficial femoral artery stenosis.  The patient was heparinized and a 6 [place] by 60 cm sheath was placed in the  proximal superficial femoral artery.  A series of wires and catheters were used to traverse the superficial femoral artery lesions.  I elected to use a 6 x 60 mm stent as we did not have any smaller sizes on the shelf.  This was  deployed and postdilated with a 5 mm balloon with an excellent result.  For the proximal lesion, I took multiple images in different orthogonal views in an effort to define the level of stenosis and appreciate what the lesion was.  It was not thrombus, and was present on my initial pictures.  There appeared to be some flow-limiting stenosis at the lesion roughly 60%.  I elected to use another 6 x 60 mm drug-eluting stent followed by post dilation with a 5 mm balloon.  With completion angiography demonstrated excellent result with resolution of flow-limiting stenosis.  The bypass filled rapidly with excellent filling of the foot.  Impression: Successful right superficial femoral artery stenting using a 6 x 60 mm drug-eluting stent x 2.  Bypass is no longer threatened. Saahas and I also discussed his left lower extremity angiography findings.  He does not have inline flow to the foot which usually inhibits healing.  He feels as though his left foot is healing with hyperbaric oxygen.  My plan is to see him in 2 weeks time for wound check.  Should the wound continue to heal, will hold off on further intervention, however if not, my plan would be to attempt recanalization of the posterior tibial artery.    Cassandria Santee, MD [facility] Office: [phone]

## 2022-09-09 NOTE — Progress Notes (Addendum)
[name], [name] A ([id number]) [id number] Page 1 of 2 Visit Report for 09/08/2022 HBO Details Patient Name: Date of Service: [name], [name] 09/08/2022 8:00 A M Medical Record Number: [id number] Patient Account Number: [id number] Date of Birth/Sex: Treating RN: 1955/12/08 (67 y.o. [name] Primary Care Keyante Durio: [name] Other Clinician: [name] Referring Brylin Stanislawski: Treating Verlaine Embry/Extender: [name] in Treatment: 21 HBO Treatment Course Details Treatment Course Number: 1 Ordering Armend Hochstatter: [name] Shan T Treatments Ordered: otal 80 HBO Treatment Start Date: 06/01/2022 HBO Indication: Diabetic Ulcer(s) of the Lower Extremity Standard/Conservative Wound Care tried and failed greater than or equal to 30 days HBO Treatment Details Treatment Number: 61 Patient Type: Outpatient Chamber Type: Monoplace Chamber Serial #: [id number] Treatment Protocol: 2.5 ATA with 90 minutes oxygen, with two 5 minute air breaks Treatment Details Compression Rate Down: 1.5 psi / minute De-Compression Rate Up: 2.0 psi / minute A breaks and breathing ir Compress Tx Pressure periods Decompress Decompress Begins Reached (leave unused spaces Begins Ends blank) Chamber Pressure (ATA 1 2.5 2.5 2.5 2.5 2.5 - - 2.5 1 ) Clock Time (24 hr) 08:11 08:23 08:53 08:58 09:28 09:33 - - 10:03 10:14 Treatment Length: 123 (minutes) Treatment Segments: 4 Vital Signs Capillary Blood Glucose Reference Range: 80 - 120 mg / dl HBO Diabetic Blood Glucose Intervention Range: <131 mg/dl or >249 mg/dl Type: Time Vitals Blood Respiratory Capillary Blood Glucose Pulse Action Pulse: Temperature: Taken: Pressure: Rate: Glucose (mg/dl): Meter #: Oximetry (%) Taken: Pre 08:06 159/79 87 18 98.2 196 1 none per protocol Post 10:18 159/102 68 18 97.3 132 1 manual bp taken Post 10:21 164/84 none per protocol. Treatment Response Treatment Toleration:  Well Treatment Completion Status: Treatment Completed without Adverse Event Treatment Notes Patient arrived, vital signs were taken. Blood glucose level was 196 mg/dL. He prepared for treatment. After performing safety check, patient was safely placed in the chamber which was compressed with 100% oxygen at a rate of 1 psi/min until confirming normal ear equalization. Then, rate set was increased to 2 psi/min and compression continued at that rate until reaching treatment depth of 2.5 ATA. Patient tolerated treatment and subsequent decompression of the chamber at 2 psi/min. [name] denied any issues with ear equalization and/or pain. Diastolic blood pressure was greater than 100 mmHg. A manual blood pressure was taken with result of 164/84 mmHg. Patient was stable upon discharge. Additional Procedure Documentation Tissue Sevierity: Necrosis of bone Physician HBO Attestation: I certify that I supervised this HBO treatment in accordance with Medicare guidelines. A trained emergency response team is readily available per Yes hospital policies and procedures. Continue HBOT as ordered. Yes Electronic Signature(s) Signed: 09/08/2022 2:52:18 PM By: [name] Previous Signature: 09/08/2022 12:01:27 PM Version By: [name] CHT EMT BS , , Previous Signature: 09/08/2022 12:10:40 PM Version By: [name] Entered By: [name] Shan on 09/08/2022 14:49:21 [name] ([id number] Page 2 of 2 -------------------------------------------------------------------------------- HBO Safety Checklist Details Patient Name: Date of Service: [name], [name] 09/08/2022 8:00 A M Medical Record Number: [id number] Patient Account Number: [id number] Date of Birth/Sex: Treating RN: [birth year] (67 y.o. [name] Primary Care Zalman Hull: [name] Other Clinician: [name] Referring [name]: Treating Tykeshia Tourangeau/Extender:  [name] in Treatment: 21 HBO Safety Checklist Items Safety Checklist Consent Form Signed Patient voided / foley secured and emptied When did you last eato 0700 Last dose of injectable or oral agent 0630 Ostomy  pouch emptied and vented if applicable NA All implantable devices assessed, documented and approved NA Intravenous access site secured and place NA Valuables secured NA Linens and cotton and cotton/polyester blend (less than 51% polyester) Personal oil-based products / skin lotions / body lotions removed Wigs or hairpieces removed NA Smoking or tobacco materials removed NA Books / newspapers / magazines / loose paper removed Cologne, aftershave, perfume and deodorant removed Jewelry removed (may wrap wedding band) Make-up removed NA Hair care products removed Battery operated devices (external) removed Heating patches and chemical warmers removed Titanium eyewear removed Nail polish cured greater than 10 hours NA Casting material cured greater than 10 hours NA Hearing aids removed NA Loose dentures or partials removed dentures removed Prosthetics have been removed NA Patient demonstrates correct use of air break device (if applicable) NA Patient concerns have been addressed Patient grounding bracelet on and cord attached to chamber Specifics for Inpatients (complete in addition to above) Medication sheet sent with patient NA Intravenous medications needed or due during therapy sent with patient NA Drainage tubes (e.g. nasogastric tube or chest tube secured and vented) NA Endotracheal or Tracheotomy tube secured NA Cuff deflated of air and inflated with saline NA Airway suctioned NA Notes Paper version used prior to treatment start. Electronic Signature(s) Signed: 09/08/2022 11:56:20 AM By: [name] CHT EMT BS , , Entered By: [name] on 09/08/2022 11:56:20

## 2022-09-09 NOTE — Progress Notes (Signed)
Pt ambulated to and from bathroom with no signs of oozing from site

## 2022-09-09 NOTE — Progress Notes (Addendum)
[name], BOUGHTON [name] ([id number]) [id number] Page 1 of 2 Visit Report for 09/03/2022 HBO Details Patient Name: Date of Service: [name], [name] 09/03/2022 8:00 [name] Medical Record Number: [id number] Patient Account Number: [id number] Date of Birth/Sex: Treating RN: Feb 18, 1956 (67 y.o. [name] Primary Care Maurisa Tesmer: [name] Other Clinician: [name] Referring Quinnie Barcelo: Treating Roslynn Holte/Extender: [name] in Treatment: 20 HBO Treatment Course Details Treatment Course Number: 1 Ordering Takima Encina: [name] T Treatments Ordered: otal 80 HBO Treatment Start Date: 06/01/2022 HBO Indication: Diabetic Ulcer(s) of the Lower Extremity Standard/Conservative Wound Care tried and failed greater than or equal to 30 days HBO Treatment Details Treatment Number: 58 Patient Type: Outpatient Chamber Type: Monoplace Chamber Serial #: [id number] Treatment Protocol: 2.5 ATA with 90 minutes oxygen, with two 5 minute air breaks Treatment Details Compression Rate Down: 1.5 psi / minute De-Compression Rate Up: 2.0 psi / minute [name] breaks and breathing ir Compress Tx Pressure periods Decompress Decompress Begins Reached (leave unused spaces Begins Ends blank) Chamber Pressure (ATA 1 2.5 2.5 2.5 2.5 2.5 - - 2.5 1 ) Clock Time (24 hr) 08:05 08:18 08:59 09:04 09:22 09:27 - - 09:57 10:08 Treatment Length: 123 (minutes) Treatment Segments: 4 Vital Signs Capillary Blood Glucose Reference Range: 80 - 120 mg / dl HBO Diabetic Blood Glucose Intervention Range: <131 mg/dl or >249 mg/dl Type: Time Vitals Blood Respiratory Capillary Blood Glucose Pulse Action Pulse: Temperature: Taken: Pressure: Rate: Glucose (mg/dl): Meter #: Oximetry (%) Taken: Pre 08:00 139/96 96 16 98.9 225 1 none per protocol Post 10:12 154/86 75 16 97.5 137 1 none per protocol Treatment Response Treatment Toleration: Well Treatment Completion Status:  Treatment Completed without Adverse Event Treatment Notes Mr. Tashiro arrived, blood glucose measured at 225 mg/dL. Patient stated that he ate breakfast 2 slices of pizza today. Other vital signs within normal limits. He prepared for treatment and was placed in the chamber after performing safety check. The chamber was compressed with 100% oxygen at 1 psi/min until patient confirmed normal ear equalization at 6 psig, at which point rate set was increased to 2.25 psi/min. Patient tolerated treatment and subsequent decompression of the chamber at 2 psi/min. Patient was stable upon discharge from HBOT Patient has wound care visit today after treatment. . Additional Procedure Documentation Tissue Sevierity: Necrosis of bone Electronic Signature(s) Signed: 09/03/2022 10:54:32 AM By: [name] CHT EMT BS , , Signed: 09/03/2022 12:51:55 PM By: [name] DO Entered By: [name] on 09/03/2022 10:54:32 -------------------------------------------------------------------------------- HBO Safety Checklist Details Patient Name: Date of Service: [name], [name] 09/03/2022 8:00 [name] Medical Record Number: [id number] Patient Account Number: [id number] Date of Birth/Sex: Treating RN: 11-10-1955 (67 y.o. [name] Primary Care Farra Nikolic: [name] Other Clinician: [name] Referring Dreshaun Stene: Treating Ehan Freas/Extender: Santos, Deloza [name] ([id number]) [id number] Page 2 of 2 Weeks in Treatment: 20 HBO Safety Checklist Items Safety Checklist Consent Form Signed Patient voided / foley secured and emptied When did you last eato 0640 Last dose of injectable or oral agent 0615 Ostomy pouch emptied and vented if applicable NA All implantable devices assessed, documented and approved NA Intravenous access site secured and place NA Valuables secured Linens and cotton and cotton/polyester blend (less than 51%  polyester) Personal oil-based products / skin lotions / body lotions removed Wigs or hairpieces removed NA Smoking or tobacco materials removed NA Books / newspapers / magazines / loose paper removed Cologne, aftershave, perfume and  deodorant removed Jewelry removed (may wrap wedding band) Make-up removed NA Hair care products removed Battery operated devices (external) removed Heating patches and chemical warmers removed Titanium eyewear removed Nail polish cured greater than 10 hours NA Casting material cured greater than 10 hours NA Hearing aids removed NA Loose dentures or partials removed dentures removed Prosthetics have been removed NA Patient demonstrates correct use of air break device (if applicable) Patient concerns have been addressed Patient grounding bracelet on and cord attached to chamber Specifics for Inpatients (complete in addition to above) Medication sheet sent with patient NA Intravenous medications needed or due during therapy sent with patient NA Drainage tubes (e.g. nasogastric tube or chest tube secured and vented) NA Endotracheal or Tracheotomy tube secured NA Cuff deflated of air and inflated with saline NA Airway suctioned NA Notes Paper version used prior to treatment start. Electronic Signature(s) Signed: 09/03/2022 10:48:46 AM By: [name] CHT EMT BS , , Entered By: [name] on 09/03/2022 10:48:46

## 2022-09-10 ENCOUNTER — Encounter (HOSPITAL_BASED_OUTPATIENT_CLINIC_OR_DEPARTMENT_OTHER): Payer: Medicare HMO | Admitting: Internal Medicine

## 2022-09-10 DIAGNOSIS — E11621 Type 2 diabetes mellitus with foot ulcer: Secondary | ICD-10-CM | POA: Diagnosis not present

## 2022-09-10 DIAGNOSIS — L97528 Non-pressure chronic ulcer of other part of left foot with other specified severity: Secondary | ICD-10-CM

## 2022-09-10 DIAGNOSIS — M86172 Other acute osteomyelitis, left ankle and foot: Secondary | ICD-10-CM

## 2022-09-10 DIAGNOSIS — L97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 severity: Secondary | ICD-10-CM

## 2022-09-10 DIAGNOSIS — L97522 Non-pressure chronic ulcer of other part of left foot with fat layer exposed: Secondary | ICD-10-CM

## 2022-09-10 LAB — GLUCOSE, CAPILLARY
Glucose-Capillary: 197 mg/dL — ABNORMAL HIGH (ref 70–99)
Glucose-Capillary: 136 mg/dL — ABNORMAL HIGH (ref 70–99)

## 2022-09-11 ENCOUNTER — Encounter (HOSPITAL_BASED_OUTPATIENT_CLINIC_OR_DEPARTMENT_OTHER): Payer: Medicare HMO | Admitting: Internal Medicine

## 2022-09-11 DIAGNOSIS — E11621 Type 2 diabetes mellitus with foot ulcer: Secondary | ICD-10-CM | POA: Diagnosis not present

## 2022-09-11 DIAGNOSIS — L97528 Non-pressure chronic ulcer of other part of left foot with other specified severity: Secondary | ICD-10-CM | POA: Diagnosis not present

## 2022-09-11 DIAGNOSIS — M86172 Other acute osteomyelitis, left ankle and foot: Secondary | ICD-10-CM

## 2022-09-11 DIAGNOSIS — L97522 Non-pressure chronic ulcer of other part of left foot with fat layer exposed: Secondary | ICD-10-CM

## 2022-09-11 LAB — GLUCOSE, CAPILLARY
Glucose-Capillary: 176 mg/dL — ABNORMAL HIGH (ref 70–99)
Glucose-Capillary: 93 mg/dL (ref 70–99)

## 2022-09-11 NOTE — Progress Notes (Signed)
JARREL, MESSMAN A ([id number]) [id number] Page 1 of 2 Visit Report for 09/10/2022 HBO Details Patient Name: Date of Service: [name], [name] 09/10/2022 8:00 A M Medical Record Number: [id number] Patient Account Number: [id number] Date of Birth/Sex: Treating RN: August 28, 1955 (67 y.o. [name] Primary Care Tiona Ruane: [name] Other Clinician: [name] Referring Kahlie Deutscher: Treating Ruthie Berch/Extender: [name] in Treatment: 21 HBO Treatment Course Details Treatment Course Number: 1 Ordering Farhan Jean: [name] T Treatments Ordered: otal 80 HBO Treatment Start Date: 06/01/2022 HBO Indication: Diabetic Ulcer(s) of the Lower Extremity Standard/Conservative Wound Care tried and failed greater than or equal to 30 days HBO Treatment Details Treatment Number: 62 Patient Type: Outpatient Chamber Type: Monoplace Chamber Serial #: [id number] Treatment Protocol: 2.5 ATA with 90 minutes oxygen, with two 5 minute air breaks Treatment Details Compression Rate Down: 2.0 psi / minute De-Compression Rate Up: 2.0 psi / minute A breaks and breathing ir Compress Tx Pressure periods Decompress Decompress Begins Reached (leave unused spaces Begins Ends blank) Chamber Pressure (ATA 1 2.5 2.5 2.5 2.5 2.5 - - 2.5 1 ) Clock Time (24 hr) 07:54 08:08 08:38 08:43 09:13 09:18 - - 09:48 09:59 Treatment Length: 125 (minutes) Treatment Segments: 4 Vital Signs Capillary Blood Glucose Reference Range: 80 - 120 mg / dl HBO Diabetic Blood Glucose Intervention Range: <131 mg/dl or >249 mg/dl Type: Time Vitals Blood Respiratory Capillary Blood Glucose Pulse Action Pulse: Temperature: Taken: Pressure: Rate: Glucose (mg/dl): Meter #: Oximetry (%) Taken: Pre 07:24 172/71 88 16 98.1 197 1 none per protocol Post 10:25 153/82 86 18 98.3 136 1 none per protocol Treatment Response Treatment Toleration: Well Treatment Completion Status:  Treatment Completed without Adverse Event Treatment Notes Patient arrived, vital signs were taken, all were normal. Mr. Aguirre prepared for treatment. After performing safety check, patient was safely placed in the chamber. The chamber was compressed with 100% oxygen at a rate of 2 psi/min after confirming normal ear equalization. Patient tolerated treatment and subsequent decompression of the chamber at 2 psi/min. Patient was stable upon discharge. Patient had a wound care encounter after treatment today. Post- treatment vital signs were taken in the room. Additional Procedure Documentation Tissue Sevierity: Necrosis of bone Physician HBO Attestation: I certify that I supervised this HBO treatment in accordance with Medicare guidelines. A trained emergency response team is readily available per Yes hospital policies and procedures. Continue HBOT as ordered. Yes Electronic Signature(s) Signed: 09/10/2022 4:58:12 PM By: [name] DO Previous Signature: 09/10/2022 11:27:20 AM Version By: [name] CHT EMT BS , , Previous Signature: 09/10/2022 12:51:47 PM Version By: [name] DO Previous Signature: 09/10/2022 9:02:15 AM Version By: [name] CHT EMT BS , , Entered By: [name] on 09/10/2022 16:57:27 [name] ([id number] Page 2 of 2 -------------------------------------------------------------------------------- HBO Safety Checklist Details Patient Name: Date of Service: [name], [name] 09/10/2022 8:00 A M Medical Record Number: [id number] Patient Account Number: [id number] Date of Birth/Sex: Treating RN: 03-Feb-1956 (67 y.o. [name] Primary Care Quierra Silverio: [name] Other Clinician: [name] Referring Uchechi Denison: Treating Kahmari Koller/Extender: [name] in Treatment: 21 HBO Safety Checklist Items Safety Checklist Consent Form Signed Patient voided / foley secured  and emptied When did you last eato 0650 Last dose of injectable or oral agent 0615 Ostomy pouch emptied and vented if applicable NA All implantable devices assessed, documented and approved NA Intravenous access site secured and place NA Valuables secured Linens and  cotton and cotton/polyester blend (less than 51% polyester) Personal oil-based products / skin lotions / body lotions removed Wigs or hairpieces removed NA Smoking or tobacco materials removed NA Books / newspapers / magazines / loose paper removed Cologne, aftershave, perfume and deodorant removed Jewelry removed (may wrap wedding band) Make-up removed Hair care products removed Battery operated devices (external) removed Heating patches and chemical warmers removed Titanium eyewear removed Nail polish cured greater than 10 hours NA Casting material cured greater than 10 hours NA Hearing aids removed NA Loose dentures or partials removed left at home Prosthetics have been removed NA Patient demonstrates correct use of air break device (if applicable) Patient concerns have been addressed Patient grounding bracelet on and cord attached to chamber Specifics for Inpatients (complete in addition to above) Medication sheet sent with patient NA Intravenous medications needed or due during therapy sent with patient NA Drainage tubes (e.g. nasogastric tube or chest tube secured and vented) NA Endotracheal or Tracheotomy tube secured NA Cuff deflated of air and inflated with saline NA Airway suctioned NA Notes Paper version used prior to treatment start. Electronic Signature(s) Signed: 09/10/2022 9:01:26 AM By: [name] CHT EMT BS , , Entered By: [name] on 09/10/2022 09:01:26

## 2022-09-11 NOTE — Progress Notes (Addendum)
[name], [name] A ([id number]) [id number] Page 1 of 2 Visit Report for 09/10/2022 Arrival Information Details Patient Name: Date of Service: [name], [name] A [name] A. 09/10/2022 8:00 A M Medical Record Number: [id number] Patient Account Number: [id number] Date of Birth/Sex: Treating RN: Nov 04, 1955 (67 y.o. [name] Primary Care Michiko Lineman: [name] Other Clinician: [name] Referring Shuntay Everetts: Treating Augustine Brannick/Extender: [name] in Treatment: 21 Visit Information History Since Last Visit All ordered tests and consults were completed: Yes Patient Arrived: Wheel Chair Added or deleted any medications: No Arrival Time: 07:15 Any new allergies or adverse reactions: No Accompanied By: self Had a fall or experienced change in No Transfer Assistance: None activities of daily living that may affect Patient Identification Verified: Yes risk of falls: Secondary Verification Process Completed: Yes Signs or symptoms of abuse/neglect since last visito No Patient Requires Transmission-Based Precautions: No Hospitalized since last visit: No Patient Has Alerts: Yes Implantable device outside of the clinic excluding No cellular tissue based products placed in the center since last visit: Pain Present Now: No Electronic Signature(s) Signed: 09/10/2022 9:00:11 AM By: [name] CHT EMT BS , , Entered By: [name] on 09/10/2022 09:00:10 -------------------------------------------------------------------------------- Encounter Discharge Information Details Patient Name: Date of Service: [name], [name] [name] A. 09/10/2022 8:00 A M Medical Record Number: [id number] Patient Account Number: [id number] Date of Birth/Sex: Treating RN: 1956-04-05 (67 y.o. [name] Primary Care [name]: [name] Other Clinician: [name] Referring Reinhart Saulters: Treating Zykira Matlack/Extender: [name] in Treatment: 21 Encounter Discharge Information Items Discharge Condition: Stable Ambulatory Status: Wheelchair Discharge Destination: Other (Note Required) Transportation: Other Accompanied By: staff Schedule Follow-up Appointment: No Clinical Summary of Care: Notes Wound Care Encounter after HBOT Electronic Signature(s) Signed: 09/10/2022 11:36:09 AM By: [name] CHT EMT BS , , Entered By: [name] on 09/10/2022 11:36:09 -------------------------------------------------------------------------------- Vitals Details Patient Name: Date of Service: [name], [name] [name] A. 09/10/2022 8:00 A M Medical Record Number: [id number] Patient Account Number: [id number] Date of Birth/Sex: Treating RN: May 02, 1956 (67 y.o. [name] Primary Care Erielle Gawronski: [name] Other Clinician: [name] Referring [name]: Treating Fransisca Shawn/Extender: [name] in Treatment: [address] A ([id number]) [id number] Page 2 of 2 Vital Signs Time Taken: 07:24 Temperature (F): 98.1 Height (in): 72 Pulse (bpm): 88 Weight (lbs): 220 Respiratory Rate (breaths/min): 16 Body Mass Index (BMI): 29.8 Blood Pressure (mmHg): 172/71 Capillary Blood Glucose (mg/dl): 197 Reference Range: 80 - 120 mg / dl Electronic Signature(s) Signed: 09/10/2022 9:00:38 AM By: [name] CHT EMT BS , , Entered By: [name] on 09/10/2022 09:00:38

## 2022-09-11 NOTE — Progress Notes (Signed)
EULAS, PLASENCIA A ([id number]) [id number] Page 1 of 1 Visit Report for 09/10/2022 SuperBill Details Patient Name: Date of Service: [name], [name] 09/10/2022 Medical Record Number: [id number] Patient Account Number: [id number] Date of Birth/Sex: Treating RN: 1956-01-10 (67 y.o. [name] Primary Care Provider: [name] Other Clinician: [name] Referring Provider: Treating Provider/Extender: [name] in Treatment: 21 Diagnosis Coding ICD-10 Codes Code Description [phone] Non-pressure chronic ulcer of other part of left foot with fat layer exposed M86.172 Other acute osteomyelitis, left ankle and foot L97.528 Non-pressure chronic ulcer of other part of left foot with other specified severity E11.621 Type 2 diabetes mellitus with foot ulcer E11.42 Type 2 diabetes mellitus with diabetic polyneuropathy S91.301A Unspecified open wound, right foot, initial encounter 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 severity Facility Procedures CPT4 Code Description Modifier Quantity [id number]-(Facility Use Only) HBOT full body chamber, 73mn , 4 ICD-10 Diagnosis Description E11.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 M86.172 Other acute osteomyelitis, left ankle and foot Physician Procedures Quantity CPT4 Code Description Modifier [id number] - WC PHYS HYPERBARIC OXYGEN THERAPY 1 ICD-10 Diagnosis Description E11.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

## 2022-09-12 NOTE — Progress Notes (Signed)
CORLIS, ROSSEN [name] ([id number]) [id number] Page 1 of 9 Visit Report for 09/10/2022 Arrival Information Details Patient Name: Date of Service: [name], [name]. 09/10/2022 10:15 [name] Medical Record Number: [id number] Patient Account Number: [id number] Date of Birth/Sex: Treating RN: 1955/08/30 (67 y.o. [name], [name] Primary Care [name]: [name] Other Clinician: Referring [name]: Treating [name]/Extender: [name] in Treatment: 21 Visit Information History Since Last Visit Added or deleted any medications: No Patient Arrived: Wheel Chair Any new allergies or adverse reactions: No Arrival Time: 10:10 Had [name] fall or experienced change in No Accompanied By: self activities of daily living that may affect Transfer Assistance: None risk of falls: Patient Identification Verified: Yes Signs or symptoms of abuse/neglect since last visito No Secondary Verification Process Completed: Yes Hospitalized since last visit: No Patient Requires Transmission-Based Precautions: No Implantable device outside of the clinic excluding No Patient Has Alerts: Yes cellular tissue based products placed in the center since last visit: Has Dressing in Place as Prescribed: Yes Pain Present Now: Yes Electronic Signature(s) Signed: 09/10/2022 4:33:02 PM By: Rhae Hammock RN Entered By: Rhae Hammock on 09/10/2022 10:10:22 -------------------------------------------------------------------------------- Encounter Discharge Information Details Patient Name: Date of Service: [name], [name]. 09/10/2022 10:15 [name] Medical Record Number: [id number] Patient Account Number: [id number] Date of Birth/Sex: Treating RN: 08-08-1955 (67 y.o. [name], [name] Primary Care [name]: [name] Other Clinician: Referring [name]: Treating [name]/Extender: [name] in Treatment: 21 Encounter Discharge  Information Items Post Procedure Vitals Discharge Condition: Stable Temperature (F): 98.7 Ambulatory Status: Wheelchair Pulse (bpm): 74 Discharge Destination: Home Respiratory Rate (breaths/min): 17 Transportation: Private Auto Blood Pressure (mmHg): 133/82 Accompanied By: self Schedule Follow-up Appointment: Yes Clinical Summary of Care: Patient Declined Electronic Signature(s) Signed: 09/10/2022 4:33:02 PM By: Rhae Hammock RN Entered By: Rhae Hammock on 09/10/2022 11:14:38 -------------------------------------------------------------------------------- Lower Extremity Assessment Details Patient Name: Date of Service: [name], [name]. 09/10/2022 10:15 [name] Medical Record Number: [id number] Patient Account Number: [id number] Date of Birth/Sex: Treating RN: 02/05/1956 (67 y.o. [name], [name] Primary Care Minie Roadcap: [name] Other Clinician: Referring [name]: Treating Mychael Soots/Extender: [name], [name] in Treatment: 21 Edema Assessment Assessed: [name]: Yes] [name]: Yes] T[LeftJanie [name] [id number] Page 2 of 9] Edema: [Left: Yes] [Right: Yes] Calf Left: Right: Point of Measurement: 36 cm From Medial Instep 37 cm 40 cm Ankle Left: Right: Point of Measurement: 9 cm From Medial Instep 22.5 cm 22 cm Vascular Assessment Pulses: Dorsalis Pedis Palpable: [Left:Yes] [Right:Yes] Posterior Tibial Palpable: [Left:Yes] [Right:Yes] Electronic Signature(s) Signed: 09/10/2022 4:33:02 PM By: Rhae Hammock RN Entered By: Rhae Hammock on 09/10/2022 10:19:08 -------------------------------------------------------------------------------- Multi Wound Chart Details Patient Name: Date of Service: [name], [name]. 09/10/2022 10:15 [name] Medical Record Number: [id number] Patient Account Number: [id number] Date of Birth/Sex: Treating RN: [birth year] (67 y.o. M) Primary Care [name]: [name] Other Clinician: Referring [name]: Treating [name]/Extender: [name], [name] in Treatment: 21 Vital Signs Height(in): 72 Capillary Blood Glucose(mg/dl): 160 Weight(lbs): 220 Pulse(bpm): 47 Body Mass Index(BMI): 29.8 Blood Pressure(mmHg): 172/71 Temperature(F): 98.1 Respiratory Rate(breaths/min): 17 [5:Photos: No Photos] [[name]:[name]] Left, Lateral Foot Right Amputation Site - [name] Wound Location: Transmetatarsal Surgical Injury Gradually Appeared [name] Wounding Event: Diabetic Wound/Ulcer of the Lower Diabetic Wound/Ulcer of the Lower [name] Primary Etiology: Extremity Extremity Open Surgical Wound [name] [name] Secondary Etiology: Asthma, Sleep Apnea, Hypertension, Asthma, Sleep Apnea, Hypertension, [name] Comorbid History: Peripheral Venous Disease,  Type II Peripheral Venous Disease, Type II Diabetes, Osteomyelitis, Neuropathy Diabetes, Osteomyelitis, Neuropathy 12/20/2021 07/16/2022 [name] Date Acquired: 21 8 [name] [name]: Open Open [name] Wound Status: No No [name] Wound Recurrence: 2.7x0.2x0.4 0.2x0.2x0.5 [name] Measurements L x W x D (cm) 0.424 0.031 [name] [name] (cm) : rea 0.17 0.016 [name] Volume (cm) : 95.80% 96.80% [name] % Reduction in [name] rea: 96.60% 98.40% [name] % Reduction in Volume: 12 Starting Position 1 (o'clock): 12 Ending Position 1 (o'clock): 0.3 Maximum Distance 1 (cm): No Yes [name] Undermining: Grade 3 Grade 2 [name] Classification: [name], [name] [name] ([id number]) [phone].pdf Page 3 of 9 Medium Medium [name] Exudate Amount: Serosanguineous Serosanguineous [name] Exudate Type: red, brown red, brown [name] Exudate Color: Epibole Distinct, outline attached [name] Wound Margin: Large (67-100%) Large (67-100%) [name] Granulation Amount: Red, Pink Red [name] Granulation Quality: Small (1-33%) None Present (0%) [name] Necrotic Amount: Fat Layer (Subcutaneous Tissue): Yes Fat Layer (Subcutaneous Tissue): Yes [name] Exposed  Structures: Fascia: No Fascia: No Tendon: No Tendon: No Muscle: No Muscle: No Joint: No Joint: No Bone: No Bone: No Large (67-100%) None [name] Epithelialization: Debridement - Selective/Open Wound Debridement - Excisional [name] Debridement: Pre-procedure Verification/Time Out 11:05 11:05 [name] Taken: Lidocaine Lidocaine [name] Pain Control: Slough Subcutaneous, Slough [name] Tissue Debrided: Non-Viable Tissue Skin/Subcutaneous Tissue [name] Level: 0.54 0.25 [name] Debridement [name] (sq cm): rea Curette Curette [name] Instrument: Minimum Minimum [name] Bleeding: Pressure Pressure [name] Hemostasis [name] chieved: 0 0 [name] Procedural Pain: 0 0 [name] Post Procedural Pain: Procedure was tolerated well Procedure was tolerated well [name] Debridement Treatment Response: 2.7x0.2x0.4 0.2x0.2x0.5 [name] Post Debridement Measurements L x W x D (cm) 0.17 0.016 [name] Post Debridement Volume: (cm) Excoriation: No Callus: Yes [name] Periwound Skin Texture: Induration: No Excoriation: No Callus: No Induration: No Crepitus: No Crepitus: No Rash: No Rash: No Scarring: No Scarring: No Maceration: No Dry/Scaly: Yes [name] Periwound Skin Moisture: Dry/Scaly: No Maceration: No Atrophie Blanche: No Atrophie Blanche: No [name] Periwound Skin Color: Cyanosis: No Cyanosis: No Ecchymosis: No Ecchymosis: No Erythema: No Erythema: No Hemosiderin Staining: No Hemosiderin Staining: No Mottled: No Mottled: No Pallor: No Pallor: No Rubor: No Rubor: No No Abnormality [name] [name] Temperature: Cellular or Tissue Based Product Cellular or Tissue Based Product [name] Procedures Performed: Debridement Debridement Treatment Notes Wound #5 (Foot) Wound Laterality: Left, Lateral Cleanser Wound Cleanser Discharge Instruction: Cleanse the wound with wound cleanser prior to applying [name] clean dressing using gauze sponges, not tissue or cotton balls. [name]-Wound Care Skin Prep Discharge Instruction: Use skin prep as  directed Topical Primary Dressing Grafix Discharge Instruction: applied by Alesandro Stueve. adaptic and steri-strips Discharge Instruction: to secure grafix to wound bed. leave in place. Secondary Dressing ABD Pad, 8x10 Discharge Instruction: Apply over primary dressing as directed. Secured With [employer], 4.5x3.1 (in/yd) Discharge Instruction: Secure with Kerlix as directed. 4M Medipore H Soft Cloth Surgical T ape, 4 x 10 (in/yd) Discharge Instruction: Secure with tape as directed. Compression [name], [name] [name] ([id number]) [id number] Page 4 of 9 Compression Stockings Add-Ons Wound #6 (Amputation Site - Transmetatarsal) Wound Laterality: Right Cleanser Wound Cleanser Discharge Instruction: Cleanse the wound with wound cleanser prior to applying [name] clean dressing using gauze sponges, not tissue or cotton balls. [name]-Wound Care Skin Prep Discharge Instruction: Use skin prep as directed Topical Primary Dressing Grafix Discharge Instruction: applied by Anndee Connett. adaptic and steri-strips Discharge Instruction: to secure grafix to wound bed. leave in place. cutimed sorbact Discharge Instruction: lightly pack into wound bed behind the grafix. Secondary Dressing ABD Pad,  8x10 Discharge Instruction: Apply over primary dressing as directed. Secured With [employer], 4.5x3.1 (in/yd) Discharge Instruction: Secure with Kerlix as directed. 25M Medipore H Soft Cloth Surgical T ape, 4 x 10 (in/yd) Discharge Instruction: Secure with tape as directed. Compression Wrap Compression Stockings Add-Ons Electronic Signature(s) Signed: 09/10/2022 12:51:47 PM By: [name] DO Entered By: [name] on 09/10/2022 12:41:30 -------------------------------------------------------------------------------- Multi-Disciplinary Care Plan Details Patient Name: Date of Service: [name], [name]. 09/10/2022 10:15 [name] Medical Record Number:  [id number] Patient Account Number: [id number] Date of Birth/Sex: Treating RN: [birth year] (67 y.o. [name], [name] Primary Care Azka Steger: [name] Other Clinician: Referring Tyresse Jayson: Treating Deonta Bomberger/Extender: [name], [name] in Treatment: 21 Active Inactive Osteomyelitis Nursing Diagnoses: Infection: osteomyelitis Goals: Diagnostic evaluation for osteomyelitis completed as ordered Date Initiated: 04/23/2022 Target Resolution Date: 10/02/2022 Goal Status: Active Signs and symptoms for osteomyelitis will be recognized and promptly addressed Date Initiated: 04/23/2022 Target Resolution Date: 10/03/2022 Goal Status: Active [name], [name] ([id number]) [phone].pdf Page 5 of 9 Interventions: Assess for signs and symptoms of osteomyelitis resolution every visit Provide education on osteomyelitis Screen for HBO Treatment Activities: Biopsy : 04/16/2022 Consult for HBO : 04/23/2022 Surgical debridement : 04/23/2022 Systemic antibiotics : 04/23/2022 T ordered outside of clinic : 04/23/2022 est Notes: Wound/Skin Impairment Nursing Diagnoses: Knowledge deficit related to ulceration/compromised skin integrity Goals: Patient/caregiver will verbalize understanding of skin care regimen Date Initiated: 04/14/2022 Target Resolution Date: 10/03/2022 Goal Status: Active Interventions: Assess patient/caregiver ability to perform ulcer/skin care regimen upon admission and as needed Assess ulceration(s) every visit Provide education on ulcer and skin care Notes: Electronic Signature(s) Signed: 09/10/2022 4:33:02 PM By: Rhae Hammock RN Entered By: Rhae Hammock on 09/10/2022 10:41:37 -------------------------------------------------------------------------------- Pain Assessment Details Patient Name: Date of Service: [name], [name]. 09/10/2022 10:15 [name] Medical Record Number: [id number] Patient Account Number:  [id number] Date of Birth/Sex: Treating RN: Nov 21, 1955 (67 y.o. [name], [name] Primary Care Jovahn Breit: [name] Other Clinician: Referring Alexiss Iturralde: Treating Alexande Sheerin/Extender: [name], [name] in Treatment: 21 Active Problems Location of Pain Severity and Description of Pain Patient Has Paino Yes Site Locations Pain Location: Generalized Pain, Pain in Ulcers With Dressing Change: Yes Duration of the Pain. Constant / Intermittento Intermittent Rate the pain. Current Pain Level: 8 Worst Pain Level: 10 Least Pain Level: 0 Tolerable Pain Level: 8 Character of Pain Describe the Pain: Aching Pain Management and Medication Current Pain Management: [name], [name] [name] ([id number]) [id number].pdf Page 6 of 9 Medication: No Cold Application: No Rest: No Massage: No Activity: No T.E.N.S.: No Heat Application: No Leg drop or elevation: No Is the Current Pain Management Adequate: Adequate How does your wound impact your activities of daily livingo Sleep: No Bathing: No Appetite: No Relationship With Others: No Bladder Continence: No Emotions: No Bowel Continence: No Work: No Toileting: No Drive: No Dressing: No Hobbies: No Electronic Signature(s) Signed: 09/10/2022 4:33:02 PM By: Rhae Hammock RN Entered By: Rhae Hammock on 09/10/2022 10:11:18 -------------------------------------------------------------------------------- Patient/Caregiver Education Details Patient Name: Date of Service: [name] 3/7/2024andnbsp10:15 [name] Medical Record Number: [id number] Patient Account Number: [id number] Date of Birth/Gender: Treating RN: 1956-02-20 (67 y.o. [name] Primary Care Physician: [name] Other Clinician: Referring Physician: Treating Physician/Extender: [name] in Treatment: 21 Education Assessment Education Provided To: Patient Education Topics  Provided Wound/Skin Impairment: Methods: Explain/Verbal Responses: Reinforcements needed, State content correctly [employer]) Signed: 09/10/2022 4:33:02 PM By: Rhae Hammock RN  Entered By: Rhae Hammock on 09/10/2022 10:41:50 -------------------------------------------------------------------------------- Wound Assessment Details Patient Name: Date of Service: [name], [name]. 09/10/2022 10:15 [name] Medical Record Number: [id number] Patient Account Number: [id number] Date of Birth/Sex: Treating RN: 10-24-1955 (67 y.o. [name], [name] Primary Care Bronda Alfred: [name] Other Clinician: Referring Winnifred Dufford: Treating Idris Edmundson/Extender: [name], [name] in Treatment: 21 Wound Status Wound Number: 5 Primary Diabetic Wound/Ulcer of the Lower Extremity Etiology: Wound Location: Left, Lateral Foot Secondary Open Surgical Wound Wounding Event: Surgical Injury Etiology: Date Acquired: 12/20/2021 Wound Open [name]: 21 Status: Clustered Wound: No Comorbid Asthma, Sleep Apnea, Hypertension, Peripheral Venous Disease, History: Type II Diabetes, Osteomyelitis, Neuropathy Wound Measurements Length: (cm) 2. Width: (cm) 0. Depth: (cm) 0. SAMI, BLUNK [name] ([id number]) Area: (cm) Volume: (cm) 7 % Reduction in Area: 95.8% 2 % Reduction in Volume: 96.6% 4 Epithelialization: Large (67-100%) [phone].pdf Page 7 of 9 0.424 Tunneling: No 0.17 Undermining: No Wound Description Classification: Grade 3 Wound Margin: Epibole Exudate Amount: Medium Exudate Type: Serosanguineous Exudate Color: red, brown Foul Odor After Cleansing: No Slough/Fibrino Yes Wound Bed Granulation Amount: Large (67-100%) Exposed Structure Granulation Quality: Red, Pink Fascia Exposed: No Necrotic Amount: Small (1-33%) Fat Layer (Subcutaneous Tissue) Exposed: Yes Necrotic Quality: Adherent Slough Tendon Exposed: No Muscle Exposed:  No Joint Exposed: No Bone Exposed: No Periwound Skin Texture Texture Color No Abnormalities Noted: No No Abnormalities Noted: Yes Callus: No Temperature / Pain Crepitus: No Temperature: No Abnormality Excoriation: No Induration: No Rash: No Scarring: No Moisture No Abnormalities Noted: No Dry / Scaly: No Maceration: No Treatment Notes Wound #5 (Foot) Wound Laterality: Left, Lateral Cleanser Wound Cleanser Discharge Instruction: Cleanse the wound with wound cleanser prior to applying [name] clean dressing using gauze sponges, not tissue or cotton balls. [name]-Wound Care Skin Prep Discharge Instruction: Use skin prep as directed Topical Primary Dressing Grafix Discharge Instruction: applied by Lonny Eisen. adaptic and steri-strips Discharge Instruction: to secure grafix to wound bed. leave in place. Secondary Dressing ABD Pad, 8x10 Discharge Instruction: Apply over primary dressing as directed. Secured With [employer], 4.5x3.1 (in/yd) Discharge Instruction: Secure with Kerlix as directed. 36M Medipore H Soft Cloth Surgical T ape, 4 x 10 (in/yd) Discharge Instruction: Secure with tape as directed. Compression Wrap Compression Stockings Add-Ons Electronic Signature(s) Signed: 09/10/2022 4:33:02 PM By: Rhae Hammock RN Entered By: Rhae Hammock on 09/10/2022 10:22:01 [name] ([id number] Page 8 of 9 -------------------------------------------------------------------------------- Wound Assessment Details Patient Name: Date of Service: [name], [name]. 09/10/2022 10:15 [name] Medical Record Number: [id number] Patient Account Number: [id number] Date of Birth/Sex: Treating RN: 09/18/1955 (67 y.o. [name], [name] Primary Care [name]: [name] Other Clinician: Referring Caleen Taaffe: Treating Markeeta Scalf/Extender: [name], [name] in Treatment: 21 Wound Status Wound Number: 6 Primary Diabetic  Wound/Ulcer of the Lower Extremity Etiology: Wound Location: Right Amputation Site - Transmetatarsal Wound Open Wounding Event: Gradually Appeared Status: Date Acquired: 07/16/2022 Comorbid Asthma, Sleep Apnea, Hypertension, Peripheral Venous Disease, [name]: 8 History: Type II Diabetes, Osteomyelitis, Neuropathy Clustered Wound: No Photos Wound Measurements Length: (cm) 0.2 Width: (cm) 0.2 Depth: (cm) 0.5 Area: (cm) 0.031 Volume: (cm) 0.016 % Reduction in Area: 96.8% % Reduction in Volume: 98.4% Epithelialization: None Tunneling: No Undermining: Yes Starting Position (o'clock): 12 Ending Position (o'clock): 12 Maximum Distance: (cm) 0.3 Wound Description Classification: Grade 2 Wound Margin: Distinct, outline attached Exudate Amount: Medium Exudate Type: Serosanguineous Exudate Color: red, brown Foul Odor After Cleansing: No Slough/Fibrino Yes  Wound Bed Granulation Amount: Large (67-100%) Exposed Structure Granulation Quality: Red Fascia Exposed: No Necrotic Amount: None Present (0%) Fat Layer (Subcutaneous Tissue) Exposed: Yes Tendon Exposed: No Muscle Exposed: No Joint Exposed: No Bone Exposed: No Periwound Skin Texture Texture Color No Abnormalities Noted: No No Abnormalities Noted: No Callus: Yes Atrophie Blanche: No Crepitus: No Cyanosis: No Excoriation: No Ecchymosis: No Induration: No Erythema: No Rash: No Hemosiderin Staining: No Scarring: No Mottled: No Pallor: No Moisture Rubor: No No Abnormalities Noted: No Dry / Scaly: Yes Maceration: No [name], [name] [name] ([id number]) [phone].pdf Page 9 of 9 Treatment Notes Wound #6 (Amputation Site - Transmetatarsal) Wound Laterality: Right Cleanser Wound Cleanser Discharge Instruction: Cleanse the wound with wound cleanser prior to applying [name] clean dressing using gauze sponges, not tissue or cotton balls. [name]-Wound Care Skin Prep Discharge Instruction: Use  skin prep as directed Topical Primary Dressing Grafix Discharge Instruction: applied by Donyale Falcon. adaptic and steri-strips Discharge Instruction: to secure grafix to wound bed. leave in place. cutimed sorbact Discharge Instruction: lightly pack into wound bed behind the grafix. Secondary Dressing ABD Pad, 8x10 Discharge Instruction: Apply over primary dressing as directed. Secured With [employer], 4.5x3.1 (in/yd) Discharge Instruction: Secure with Kerlix as directed. 43M Medipore H Soft Cloth Surgical T ape, 4 x 10 (in/yd) Discharge Instruction: Secure with tape as directed. Compression Wrap Compression Stockings Add-Ons Electronic Signature(s) Signed: 09/10/2022 4:33:02 PM By: Rhae Hammock RN Entered By: Rhae Hammock on 09/10/2022 10:22:21 -------------------------------------------------------------------------------- Vitals Details Patient Name: Date of Service: [name], [name]. 09/10/2022 10:15 [name] Medical Record Number: [id number] Patient Account Number: [id number] Date of Birth/Sex: Treating RN: [birth year] (67 y.o. [name], [name] Primary Care Yanel Dombrosky: [name] Other Clinician: Referring Braelyn Jenson: Treating Keoshia Steinmetz/Extender: [name], [name] in Treatment: 21 Vital Signs Time Taken: 10:10 Temperature (F): 98.1 Height (in): 72 Pulse (bpm): 88 Weight (lbs): 220 Respiratory Rate (breaths/min): 17 Body Mass Index (BMI): 29.8 Blood Pressure (mmHg): 172/71 Capillary Blood Glucose (mg/dl): 160 Reference Range: 80 - 120 mg / dl Electronic Signature(s) Signed: 09/10/2022 4:33:02 PM By: Rhae Hammock RN Entered By: Rhae Hammock on 09/10/2022 10:10:53

## 2022-09-12 NOTE — Progress Notes (Signed)
ARNES, GRANUM [name] ([id number]) [id number] Page 1 of 13 Visit Report for 08/07/2022 Chief Complaint Document Details Patient Name: Date of Service: [name], [name] 08/07/2022 11:00 [name] Medical Record Number: [id number] Patient Account Number: [id number] Date of Birth/Sex: Treating RN: 10/22/1955 (67 y.o. M) Primary Care Provider: [name] Other Clinician: Referring Provider: Treating Provider/Extender: [name], [name] in Treatment: 16 Information Obtained from: Patient Chief Complaint 10/16/20; patient is here with [name] substantial wound on his right foot in the setting of [name] recent transmetatarsal amputation. 04/14/2022; referral from podiatry for postsurgical wound of nonhealing partial left fifth ray amputation, right foot (07/16/22): TMA dehiscence Electronic Signature(s) Signed: 08/07/2022 11:56:15 AM By: [name] DO Entered By: [name] on 08/07/2022 09:30:32 -------------------------------------------------------------------------------- Cellular or Tissue Based Product Details Patient Name: Date of Service: [name], [name] 08/07/2022 11:00 [name] Medical Record Number: [id number] Patient Account Number: [id number] Date of Birth/Sex: Treating RN: October 08, 1955 (67 y.o. [name], [name] Primary Care Provider: [name] Other Clinician: Referring Provider: Treating Provider/Extender: [name] in Treatment: 16 Cellular or Tissue Based Product Type Wound #5 Left,Lateral Foot Applied to: Performed By: Physician [name], DO Cellular or Tissue Based Product Type: Grafix prime Level of Consciousness (Pre-procedure): Awake and Alert Pre-procedure Verification/Time Out Yes - 09:20 Taken: Location: genitalia / hands / feet / multiple digits Wound Size (sq cm): 2.25 Product Size (sq cm): 6 Waste Size (sq cm): 0 Amount of Product Applied (sq cm): 6 Instrument Used: Forceps,  Scissors Lot #: [id number] Expiration Date: 11/18/2023 Fenestrated: No Reconstituted: Yes Solution Type: normal saline Solution Amount: 36m Lot #: [id number] Expiration Date: 12/03/2024 Secured: Yes Secured With: Steri-Strips Dressing Applied: Yes Primary Dressing: adaptic, gauze Procedural Pain: 0 Post Procedural Pain: 0 Response to Treatment: Procedure was tolerated well Level of Consciousness (Post- Awake and Alert procedure): Post Procedure Diagnosis Same as Pre-procedure Electronic Signature(s) Signed: 08/07/2022 11:56:15 AM By: HKalman ShanDO Signed: 09/10/2022 4:33:55 PM By: BRhae HammockRN TEvlyn Clines([id number] Page 2 of 13 Entered By: BRhae Hammockon 08/07/2022 09:16:36 -------------------------------------------------------------------------------- Debridement Details Patient Name: Date of Service: [name] [name] 08/07/2022 11:00 [name] Medical Record Number: [id number] Account Number: [id number] of Birth/Sex: Treating RN: 1Apr 03, 1957(67y.o. MBurnadette Pop [name] Primary Care Provider: BRoselee NovaOther Clinician: Referring Provider: Treating Provider/Extender: HBurgess Amorin Treatment: 16 Debridement Performed for Assessment: Wound #5 Left,Lateral Foot Performed By: Physician HKalman Shan DO Debridement Type: Debridement Severity of Tissue Pre Debridement: Fat layer exposed Level of Consciousness (Pre-procedure): Awake and Alert Pre-procedure Verification/Time Out Yes - 09:15 Taken: Start Time: 09:15 Pain Control: Lidocaine T Area Debrided (L x W): otal 4.5 (cm) x 0.5 (cm) = 2.25 (cm) Tissue and other material debrided: Viable, Non-Viable, Callus, Slough, Subcutaneous, Slough Level: Skin/Subcutaneous Tissue Debridement Description: Excisional Instrument: Curette Bleeding: Minimum Hemostasis Achieved: Pressure End Time: 09:15 Procedural Pain:  0 Post Procedural Pain: 0 Response to Treatment: Procedure was tolerated well Level of Consciousness (Post- Awake and Alert procedure): Post Debridement Measurements of Total Wound Length: (cm) 4.5 Width: (cm) 0.5 Depth: (cm) 0.3 Volume: (cm) 0.53 Character of Wound/Ulcer Post Debridement: Improved Severity of Tissue Post Debridement: Fat layer exposed Post Procedure Diagnosis Same as Pre-procedure Electronic Signature(s) Signed: 08/07/2022 11:56:15 AM By: HKalman ShanDO Signed: 09/10/2022 4:33:55 PM By: BRhae HammockRN Entered By: BRhae Hammockon 08/07/2022 09:11:54 -------------------------------------------------------------------------------- Debridement Details Patient Name: Date of  Service: [name], [name] 08/07/2022 11:00 [name] Medical Record Number: [id number] Patient Account Number: [id number] Date of Birth/Sex: Treating RN: 04/12/1956 (67 y.o. [name], [name] Primary Care Provider: [name] Other Clinician: Referring Provider: Treating Provider/Extender: [name] in Treatment: 16 Debridement Performed for Assessment: Wound #6 Right Amputation Site - Transmetatarsal Performed By: Physician [name], DO Debridement Type: Debridement Severity of Tissue Pre Debridement: Fat layer exposed Level of Consciousness (Pre-procedure): Awake and Alert Pre-procedure Verification/Time Out Yes - 09:15 Taken: Start Time: 09:15 Pain Control: Lidocaine T Area Debrided (L x W): otal 0.3 (cm) x 0.5 (cm) = 0.15 (cm) Tissue and other material debrided: Viable, Non-Viable, Callus, Slough, Subcutaneous, Slough Level: Skin/Subcutaneous Tissue [name], [name] [name] ([id number]) [id number] Page 3 of 13 Debridement Description: Excisional Instrument: Curette Bleeding: Minimum Hemostasis Achieved: Pressure End Time: 09:15 Procedural Pain: 0 Post Procedural Pain: 0 Response to Treatment: Procedure was  tolerated well Level of Consciousness (Post- Awake and Alert procedure): Post Debridement Measurements of Total Wound Length: (cm) 0.3 Width: (cm) 0.5 Depth: (cm) 0.5 Volume: (cm) 0.059 Character of Wound/Ulcer Post Debridement: Improved Severity of Tissue Post Debridement: Fat layer exposed Post Procedure Diagnosis Same as Pre-procedure Electronic Signature(s) Signed: 08/07/2022 11:56:15 AM By: [name] DO Signed: 09/10/2022 4:33:55 PM By: Rhae Hammock RN Entered By: Rhae Hammock on 08/07/2022 09:12:39 -------------------------------------------------------------------------------- HPI Details Patient Name: Date of Service: [name], [name] 08/07/2022 11:00 [name] Medical Record Number: [id number] Patient Account Number: [id number] Date of Birth/Sex: Treating RN: 20-Nov-1955 (67 y.o. M) Primary Care Provider: [name] Other Clinician: Referring Provider: Treating Provider/Extender: [name], [name] in Treatment: 16 History of Present Illness HPI Description: ADMISSION 10/16/20 This is [name] 67 year old man who is [name] type II diabetic. Initially seen by Dr. Unk Lightning of vein and vascular on 09/10/2021 for bilateral lower extremity rest pain right greater than left. His ABIs demonstrated monophasic waveforms at the ankles bilaterally with depressed toe pressures. His ABI in the right was 0.5 and on the left 0.28. There were no obtainable waveforms for TBI's. He underwent angiography on 09/10/2021 and had findings of severe PAD with 95% stenosis of the distal SFA. He also had peroneal and posterior tibial arteries occluded with significant collateralization in the calf there was reconstitution of the posterior tibial artery in the distal third of the calf. Ostia of the anterior tibial artery was not appreciated. He underwent [name] angioplasty of the superficial femoral artery. He required admission to hospital from 09/12/2021 through 09/22/2021 with right foot  cellulitis and sepsis. On 3/13 he underwent [name] right below-knee popliteal to posterior tibial bypass using [name] greater saphenous vein. Ultimately however he required [name] right TMA by podiatry which I believe was done on 3/17 for progressive diabetic foot infection. He was discharged to [facility] skilled facility. He arrives in clinic today with [name] large wound area over the entirety of his amputation site extending proximally. The attempted flap closure of the amputation site and his TMA has failed and the wound extends under the attempt to closure. Still some sutures in place. There is an odor but he is not systemically unwell. He is due for follow-up arterial studies and has an appointment with vascular surgery on 10/28/2021. They are using wet-to-dry dressings at [facility]. Past medical history includes type 2 diabetes with peripheral neuropathy, hypertension, obstructive sleep apnea 4/20; patient presents for follow-up. He is being discharged from his facility at [facility] today. They have been doing Dakin's  wet-to-dry dressings. He states that his fiance can help with dressing changes. He is getting [facility] home [facility] to come out 3 times [name] week once he leaves the facility. He is scheduled to see vein and vascular on 5/12. He has not seen the wound himself. He puts covers over his face for wound exams. He currently denies systemic signs of infection. 4/28; patient admitted to the clinic 2 weeks ago with [name] dehisced right TMA in the setting of type 2 diabetes with significant PAD. He is status post revascularization. He has been discharged from the nursing home he was at and now is at home using Dakin's wet-to-dry dressings daily he has home health. He denies any systemic signs of infection 5/4; patient presents for follow-up. His fiance and home health have been changing his dressings. He currently denies systemic signs of infection. He continues to put sheet covers over his face during our wound  encounters because he does not want [name] look at the wound. 5/25; Patient presents for follow-up. He missed his last clinic appointment. He had [name] bone biopsy and culture done at last clinic visit that showed acute osteomyelitis with growth of E. coli, stenotrophomonas maltophilia and enteroccocus faecalis. [name] referral to infectious disease was made. He has not heard from them yet. He has been using Dakin's wet-to-dry dressings. He is now more comfortable with looking at the wound bed. 6/6; the patient comes into clinic today with 2 wounds on the left foot which apparently have been there for several months. This was not known to assess but was known by Dr. Donzetta Matters. In fact he underwent an angiogram yesterday. He had [name] laser atherectomy of the left posterior tibial artery with [name] 3 mm balloon angioplasty, also [name] stent of the last left SFA. 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 This is [name] lot better than the last time I saw it. He has been using Dakin's wet-to-dry. He has an appointment with infectious disease for [name] bone biopsy which showed E. coli, stenotrophomonas and Enterococcus faecalis. The appointment is for tomorrow 04/14/2022 Mr. Itzae Crawl ajuddin is an uncontrolled type 2 diabetic on insulin that presents with [name] left foot ulcer. On 12/18/2021 he required [name] left fifth ray amputation. He had revision of this site on 6/17 by Dr. Posey Pronto, podiatry. He states he has had [name] wound VAC on this area for the past 3 weeks. It is unclear what the prior wound care has been. He has [name] history of osteomyelitis to this area and was treated with IV and oral antibiotics For 6 weeks by Dr. Linus Salmons of infectious disease. At his last follow-up on 02/20/2022 his CRP was trending down and  antibiotics were stopped. He also has [name] history of peripheral arterial disease status post stenting to the left SFA and balloon angioplasty to the left posterior tibial artery On 12/2021. He has follow-up with vein and vascular at the end of the month. He also has [name] wound to the dorsal aspect of the left foot that he has been placing Betadine on. 10/19; patient presents for follow-up. He had [name] bone biopsy of the left foot at last clinic visit showed osteomyelitis. The bone culture showed Enterobacter cloacae, Enterococcus faecalis, Staphylococcus aureus, viridans group strep, actinotigum schaelii. He  has been referred to infectious disease And has an appointment on October 25. He has been using Dakin's wet-to-dry dressing to the lateral left foot wound and Hydrofera Blue and Medihoney to the dorsal foot wound. He has no issues or complaints today. He denies systemic signs of infection. 10/26; patient presents for follow-up. He saw Dr. Linus Salmons yesterday with infectious disease and has been prescribed doxycycline and Levaquin for his chronic osteomyelitis. He has been using Dakin's wet-to-dry dressings to the lateral left foot wound and Medihoney and Hydrofera Blue to the dorsal wound. We discussed [name] skin graft for the dorsal foot wound and he would like to proceed with insurance verification. 11/3; patient with [name] wound on the dorsal left foot and [name] substantial postsurgical area on the lateral left foot fifth ray amputation. He is also followed by Dr. Alton Revere of infectious disease. He had [name] bone biopsy from the left lateral foot that showed acute osteomyelitis. Swab culture showed multiple organisms. He has also been revascularized by infectious disease status post left SFA stenting and tibial laser atherectomy and angioplasty. An MRI of the left foot had previously shown osteomyelitis of the fifth met head we have been using Dakin's wet-to-dry to the left lateral foot and Medihoney and Hydrofera Blue to  the wound on the dorsal foot. 11/13; HBO treatment was discussed at last clinic visit and patient would like to proceed with this. He has orientation today. He has been approved for Grafix. We have been using Hydrofera Blue and Medihoney to the left dorsal foot and collagen to the left lateral foot. Patient has home health that comes in for dressing changes. He currently has no issues or complaints today. 12/7; patient missed his last clinic appointment. He was last seen 3 weeks ago. He is currently off of antibiotics per ID. He completed [name] prolonged treatment. He has been using collagen to the lateral foot wound. He has been using Medihoney and Hydrofera Blue to the dorsal foot wound. He has been doing well with hyperbaric oxygen therapy. He has no issues or complaints today. He denies signs of of infection. 12/14; patient presents for follow-up. We have been using Grafix to the wound beds. He has had trouble equalizing the pressure in his ears during HBO treatments. It was recommended he follow-up with ENT He does not have an appointment yet. He currently denies signs of infection. . 12/21; patient presents for follow-up. He was seen by ENT and had tubes placed. He is continued HBO without issues. We have been using Grafix to the wound beds. There is been improvement in wound healing. 12/21; patient presents for follow-up. He has no issues or complaints today. There is been improvement in wound healing. 1/5; patient presents for follow-up. At last clinic visit Grafix was placed in standard fashion. He has no issues or complaints today. He is tolerating hyperbaric oxygen therapy well. He denies signs of infection. 1/11; patient presents for follow-up. We have been placing Grafix to the wound beds on the left foot. The dorsal left foot wound has healed. The left lateral foot wound has shown improvement in healing. Unfortunately has had dehiscence of his previous surgical wound site on the right  foot. He currently denies signs of infection. 1/19; patient presents for follow-up. Grafix was placed to the left foot wound at last clinic visit. He has been using Dakin's wet-to-dry dressings to the right foot wound. He continues HBO therapy without issues. He has shown improvement in wound healing to the left foot.  1/26; patient presents for follow-up. We been placing Grafix to the left foot wound and patient has been using Dakin's wet-to-dry dressings to the right foot wound. There is been improvement in wound healing. He continues HBO therapy without issues. 2/2; patient presents for follow-up. We have been using Grafix to the left foot wound and Dakin's wet-to-dry dressings to the right foot wound. He has no issues or complaints today. Electronic Signature(s) Signed: 08/07/2022 11:56:15 AM By: [name] DO Entered By: [name] on 08/07/2022 09:31:04 -------------------------------------------------------------------------------- Physical Exam Details Patient Name: Date of Service: [name], [name] 08/07/2022 11:00 [name] Medical Record Number: [id number] Patient Account Number: [id number] Date of Birth/Sex: Treating RN: 10/16/1955 (67 y.o. M) Primary Care Provider: [name] Other Clinician: Referring Provider: Treating Provider/Extender: [name], [name] in Treatment: 16 Constitutional respirations regular, non-labored and within target range for patient.. Cardiovascular 2+ dorsalis pedis/posterior tibialis pulses. Psychiatric pleasant and cooperative. ANJAN, ETCHEVERRY [name] ([id number]) [id number] Page 5 of 13 Notes Left foot: T the lateral aspect there is an open wound with granulation tissue and nonviable tissue. No probing to bone . Right foot: Along the previous TMA o site there is an open wound with nonviable tissue, granulation tissue and callus. There is increased depth In the center. Does not probe to bone. No  signs of surrounding soft tissue infection. Electronic Signature(s) Signed: 08/07/2022 11:56:15 AM By: [name] DO Entered By: [name] on 08/07/2022 09:31:35 -------------------------------------------------------------------------------- Physician Orders Details Patient Name: Date of Service: [name], [name] 08/07/2022 11:00 [name] Medical Record Number: [id number] Patient Account Number: [id number] Date of Birth/Sex: Treating RN: Jul 21, 1955 (67 y.o. [name], [name] Primary Care Provider: [name] Other Clinician: Referring Provider: Treating Provider/Extender: [name] in Treatment: [phone] Verbal / Phone Orders: No Diagnosis Coding Follow-up Appointments ppointment in 1 week. - Dr. Heber [place] Thursday 1015 08/13/2022 Return [name] ppointment in 2 weeks. - w/ Dr. Heber [place] ( go ahead and make appt.) Return [name] Anesthetic (In clinic) Topical Lidocaine 5% applied to wound bed Cellular or Tissue Based Products Cellular or Tissue Based Product Type: - Run IVR for Grafix for left dorsal foot- pending Grafix # 1 applied 06/11/22 Grafix # 2 applied 06/18/22 Grafix # 3 applied 06/25/22 GRafix # 4 applied 07/02/22 Grafix # 5 applied 07/10/22 Grafix # 6 applied 07/17/22 Grafix # 7 applied 07/24/22---Also, go ahead and run IVR for more Grafix for left lateral foot Grafix #8 applied 07/31/2022 Grafix # 9 applied 08/07/22 Cellular or Tissue Based Product applied to wound bed, secured with steri-strips, cover with Adaptic or Mepitel. (DO NOT REMOVE). Edema Control - Lymphedema / SCD / Other Avoid standing for long periods of time. Off-Loading Open toe surgical shoe to: - wear when standing or walking. Hyperbaric Oxygen Therapy Evaluate for HBO Therapy - 07/24/22- Pt. to finish next 8 HBO treatments 07/24/22- DR. Myron Lona wants pt. to be extended for 40 more treatments Indication: - Wagner Grade III to left lateral foot 2.5 ATA for 90 Minutes with 2  Five (5) Minute [name] Breaks ir Total Number of Treatments: - 40; extend for 40 more One treatments per day (delivered Monday through Friday unless otherwise specified in Special Instructions below): Finger stick Blood Glucose Pre- and Post- HBOT Treatment. Follow Hyperbaric Oxygen Glycemia Protocol Afrin (Oxymetazoline HCL) 0.05% nasal spray - 1 spray in both nostrils daily as needed prior to HBO treatment for difficulty clearing ears Wound Treatment Wound #5 - Foot Wound  Laterality: Left, Lateral Cleanser: Wound Cleanser (Generic) 1 x Per Day/30 Days Discharge Instructions: Cleanse the wound with wound cleanser prior to applying [name] clean dressing using gauze sponges, not tissue or cotton balls. Peri-Wound Care: Skin Prep (Generic) 1 x Per Day/30 Days Discharge Instructions: Use skin prep as directed Prim Dressing: Grafix (Generic) 1 x Per Day/30 Days ary Secondary Dressing: ABD Pad, 8x10 (Generic) 1 x Per Day/30 Days Discharge Instructions: Apply over primary dressing as directed. Secured With: [employer], 4.5x3.1 (in/yd) (Generic) 1 x Per Day/30 Days Discharge Instructions: Secure with Kerlix as directed. Secured With: 25M Medipore H Soft Cloth Surgical T ape, 4 x 10 (in/yd) (Generic) 1 x Per Day/30 Days Discharge Instructions: Secure with tape as directed. NAJAI, NIE [name] ([id number]) [id number] Page 6 of 13 Wound #6 - Amputation Site - Transmetatarsal Wound Laterality: Right Cleanser: Wound Cleanser (Generic) 1 x Per Day/30 Days Discharge Instructions: Cleanse the wound with wound cleanser prior to applying [name] clean dressing using gauze sponges, not tissue or cotton balls. Prim Dressing: Dakin's Solution 0.25%, 16 (oz) 1 x Per Day/30 Days ary Discharge Instructions: Moisten gauze with Dakin's solution Secondary Dressing: ABD Pad, 8x10 1 x Per Day/30 Days Discharge Instructions: Apply over primary dressing as directed. Secondary Dressing: Woven Gauze  Sponge, Non-Sterile 4x4 in (Generic) 1 x Per Day/30 Days Discharge Instructions: Apply over primary dressing as directed. Secured With: [employer], 4.5x3.1 (in/yd) (Generic) 1 x Per Day/30 Days Discharge Instructions: Secure with Kerlix as directed. GLYCEMIA INTERVENTIONS PROTOCOL PRE-HBO GLYCEMIA INTERVENTIONS ACTION INTERVENTION Obtain pre-HBO capillary blood glucose (ensure 1 physician order is in chart). [name]. Notify HBO physician and await physician orders. 2 If result is 70 mg/dl or below: B. If the result meets the hospital definition of [name] critical result, follow hospital policy. [name]. Give patient an 8 ounce Glucerna Shake, an 8 ounce Ensure, or 8 ounces of [name] Glucerna/Ensure equivalent dietary supplement*. B. Wait 30 minutes. If result is 71 mg/dl to 130 mg/dl: C. Retest patients capillary blood glucose (CBG). D. If result greater than or equal to 110 mg/dl, proceed with HBO. If result less than 110 mg/dl, notify HBO physician and consider holding HBO. If result is 131 mg/dl to 249 mg/dl: [name]. Proceed with HBO. [name]. Notify HBO physician and await physician orders. B. It is recommended to hold HBO and do If result is 250 mg/dl or greater: blood/urine ketone testing. C. If the result meets the hospital definition of [name] critical result, follow hospital policy. POST-HBO GLYCEMIA INTERVENTIONS ACTION INTERVENTION Obtain post HBO capillary blood glucose (ensure 1 physician order is in chart). [name]. Notify HBO physician and await physician orders. 2 If result is 70 mg/dl or below: B. If the result meets the hospital definition of [name] critical result, follow hospital policy. [name]. Give patient an 8 ounce Glucerna Shake, an 8 ounce Ensure, or 8 ounces of [name] Glucerna/Ensure equivalent dietary supplement*. B. Wait 15 minutes for symptoms of If result is 71 mg/dl to 100 mg/dl: hypoglycemia (i.e. nervousness, anxiety, sweating, chills, clamminess, irritability, confusion,  tachycardia or dizziness). C. If patient asymptomatic, discharge patient. If patient symptomatic, repeat capillary blood glucose (CBG) and notify HBO physician. If result is 101 mg/dl to 249 mg/dl: [name]. Discharge patient. [name]. Notify HBO physician and await physician orders. B. It is recommended to do blood/urine ketone If result is 250 mg/dl or greater: testing. C. If the result meets the hospital definition of [name] critical result, follow hospital policy. *Juice or candies are NOT  equivalent products. If patient refuses the Glucerna or Ensure, please consult the hospital dietitian for an appropriate substitute. Electronic Signature(s) Signed: 08/07/2022 11:56:15 AM By: [name] DO Entered By: [name] on 08/07/2022 09:31:43 Problem List Details -------------------------------------------------------------------------------- [name] ([id number]) [id number] Page 7 of 13 Patient Name: Date of Service: [name], [name] 08/07/2022 11:00 [name] Medical Record Number: [id number] Patient Account Number: [id number] Date of Birth/Sex: Treating RN: [birth year] (67 y.o. M) Primary Care Provider: [name] Other Clinician: Referring Provider: Treating Provider/Extender: [name] in Treatment: 16 Active Problems ICD-10 Encounter Code Description Active Date MDM Diagnosis L97.522 Non-pressure chronic ulcer of other part of left foot with fat layer exposed 04/14/2022 No Yes M86.172 Other acute osteomyelitis, left ankle and foot 05/08/2022 No Yes L97.528 Non-pressure chronic ulcer of other part of left foot with other specified 04/14/2022 No Yes severity E11.621 Type 2 diabetes mellitus with foot ulcer 04/14/2022 No Yes E11.42 Type 2 diabetes mellitus with diabetic polyneuropathy 04/14/2022 No Yes S91.301A Unspecified open wound, right foot, initial encounter 07/16/2022 No Yes L97.518 Non-pressure chronic ulcer of  other part of right foot with other specified 07/16/2022 No Yes severity Inactive Problems Resolved Problems Electronic Signature(s) Signed: 08/07/2022 11:56:15 AM By: [name] DO Entered By: [name] on 08/07/2022 09:30:16 -------------------------------------------------------------------------------- Progress Note Details Patient Name: Date of Service: [name], [name] 08/07/2022 11:00 [name] Medical Record Number: [id number] Patient Account Number: [id number] Date of Birth/Sex: Treating RN: 04/22/1956 (67 y.o. M) Primary Care Provider: [name] Other Clinician: Referring Provider: Treating Provider/Extender: [name], [name] in Treatment: 16 Subjective Chief Complaint Information obtained from Patient 10/16/20; patient is here with [name] substantial wound on his right foot in the setting of [name] recent transmetatarsal amputation. 04/14/2022; referral from podiatry for postsurgical wound of nonhealing partial left fifth ray amputation, right foot (07/16/22): TMA dehiscence History of Present Illness (HPI) ADMISSION 10/16/20 COLSTON, EINCK [name] ([id number]) [id number] Page 8 of 13 This is [name] 67 year old man who is [name] type II diabetic. Initially seen by Dr. Unk Lightning of vein and vascular on 09/10/2021 for bilateral lower extremity rest pain right greater than left. His ABIs demonstrated monophasic waveforms at the ankles bilaterally with depressed toe pressures. His ABI in the right was 0.5 and on the left 0.28. There were no obtainable waveforms for TBI's. He underwent angiography on 09/10/2021 and had findings of severe PAD with 95% stenosis of the distal SFA. He also had peroneal and posterior tibial arteries occluded with significant collateralization in the calf there was reconstitution of the posterior tibial artery in the distal third of the calf. Ostia of the anterior tibial artery was not appreciated. He underwent [name] angioplasty of  the superficial femoral artery. He required admission to hospital from 09/12/2021 through 09/22/2021 with right foot cellulitis and sepsis. On 3/13 he underwent [name] right below-knee popliteal to posterior tibial bypass using [name] greater saphenous vein. Ultimately however he required [name] right TMA by podiatry which I believe was done on 3/17 for progressive diabetic foot infection. He was discharged to [facility] skilled facility. He arrives in clinic today with [name] large wound area over the entirety of his amputation site extending proximally. The attempted flap closure of the amputation site and his TMA has failed and the wound extends under the attempt to closure. Still some sutures in place. There is an odor but he is not systemically unwell. He is due for follow-up arterial studies and has an  appointment with vascular surgery on 10/28/2021. They are using wet-to-dry dressings at [facility]. Past medical history includes type 2 diabetes with peripheral neuropathy, hypertension, obstructive sleep apnea 4/20; patient presents for follow-up. He is being discharged from his facility at [facility] today. They have been doing Dakin's wet-to-dry dressings. He states that his fiance can help with dressing changes. He is getting [facility] home [facility] to come out 3 times [name] week once he leaves the facility. He is scheduled to see vein and vascular on 5/12. He has not seen the wound himself. He puts covers over his face for wound exams. He currently denies systemic signs of infection. 4/28; patient admitted to the clinic 2 weeks ago with [name] dehisced right TMA in the setting of type 2 diabetes with significant PAD. He is status post revascularization. He has been discharged from the nursing home he was at and now is at home using Dakin's wet-to-dry dressings daily he has home health. He denies any systemic signs of infection 5/4; patient presents for follow-up. His fiance and home health have been changing his dressings. He  currently denies systemic signs of infection. He continues to put sheet covers over his face during our wound encounters because he does not want [name] look at the wound. 5/25; Patient presents for follow-up. He missed his last clinic appointment. He had [name] bone biopsy and culture done at last clinic visit that showed acute osteomyelitis with growth of E. coli, stenotrophomonas maltophilia and enteroccocus faecalis. [name] referral to infectious disease was made. He has not heard from them yet. He has been using Dakin's wet-to-dry dressings. He is now more comfortable with looking at the wound bed. 6/6; the patient comes into clinic today with 2 wounds on the left foot which apparently have been there for several months. This was not known to assess but was known by Dr. Donzetta Matters. In fact he underwent an angiogram yesterday. He had [name] laser atherectomy of the left posterior tibial artery with [name] 3 mm balloon angioplasty, also [name] stent of the last left SFA. 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 This is [name] lot better than the last time I saw it. He has been using Dakin's wet-to-dry. He has an appointment with infectious disease for [name] bone biopsy which showed E. coli, stenotrophomonas and Enterococcus faecalis. The appointment is for tomorrow 04/14/2022 Mr. Jru Randel ajuddin is an uncontrolled type 2 diabetic on insulin that presents with [name] left foot ulcer. On 12/18/2021 he required [name] left fifth ray amputation. He had revision of this site on 6/17 by Dr. Posey Pronto, podiatry. He states he has had [name] wound VAC on this area for the past 3 weeks. It is unclear what the prior wound care has been. He has [name] history of osteomyelitis to this area and was treated with IV and oral antibiotics For 6 weeks by Dr. Linus Salmons of infectious disease. At his last follow-up on 02/20/2022 his  CRP was trending down and antibiotics were stopped. He also has [name] history of peripheral arterial disease status post stenting to the left SFA and balloon angioplasty to the left posterior tibial artery On 12/2021. He has follow-up with vein and vascular at the end of the month. He also has [name] wound to the dorsal aspect of the left foot that he has been placing  Betadine on. 10/19; patient presents for follow-up. He had [name] bone biopsy of the left foot at last clinic visit showed osteomyelitis. The bone culture showed Enterobacter cloacae, Enterococcus faecalis, Staphylococcus aureus, viridans group strep, actinotigum schaelii. He has been referred to infectious disease And has an appointment on October 25. He has been using Dakin's wet-to-dry dressing to the lateral left foot wound and Hydrofera Blue and Medihoney to the dorsal foot wound. He has no issues or complaints today. He denies systemic signs of infection. 10/26; patient presents for follow-up. He saw Dr. Linus Salmons yesterday with infectious disease and has been prescribed doxycycline and Levaquin for his chronic osteomyelitis. He has been using Dakin's wet-to-dry dressings to the lateral left foot wound and Medihoney and Hydrofera Blue to the dorsal wound. We discussed [name] skin graft for the dorsal foot wound and he would like to proceed with insurance verification. 11/3; patient with [name] wound on the dorsal left foot and [name] substantial postsurgical area on the lateral left foot fifth ray amputation. He is also followed by Dr. Alton Revere of infectious disease. He had [name] bone biopsy from the left lateral foot that showed acute osteomyelitis. Swab culture showed multiple organisms. He has also been revascularized by infectious disease status post left SFA stenting and tibial laser atherectomy and angioplasty. An MRI of the left foot had previously shown osteomyelitis of the fifth met head we have been using Dakin's wet-to-dry to the left lateral foot and Medihoney  and Hydrofera Blue to the wound on the dorsal foot. 11/13; HBO treatment was discussed at last clinic visit and patient would like to proceed with this. He has orientation today. He has been approved for Grafix. We have been using Hydrofera Blue and Medihoney to the left dorsal foot and collagen to the left lateral foot. Patient has home health that comes in for dressing changes. He currently has no issues or complaints today. 12/7; patient missed his last clinic appointment. He was last seen 3 weeks ago. He is currently off of antibiotics per ID. He completed [name] prolonged treatment. He has been using collagen to the lateral foot wound. He has been using Medihoney and Hydrofera Blue to the dorsal foot wound. He has been doing well with hyperbaric oxygen therapy. He has no issues or complaints today. He denies signs of of infection. 12/14; patient presents for follow-up. We have been using Grafix to the wound beds. He has had trouble equalizing the pressure in his ears during HBO treatments. It was recommended he follow-up with ENT He does not have an appointment yet. He currently denies signs of infection. . 12/21; patient presents for follow-up. He was seen by ENT and had tubes placed. He is continued HBO without issues. We have been using Grafix to the wound beds. There is been improvement in wound healing. 12/21; patient presents for follow-up. He has no issues or complaints today. There is been improvement in wound healing. 1/5; patient presents for follow-up. At last clinic visit Grafix was placed in standard fashion. He has no issues or complaints today. He is tolerating hyperbaric oxygen therapy well. He denies signs of infection. 1/11; patient presents for follow-up. We have been placing Grafix to the wound beds on the left foot. The dorsal left foot wound has healed. The left lateral foot wound has shown improvement in healing. Unfortunately has had dehiscence of his previous surgical wound  site on the right foot. He currently denies signs of infection. 1/19; patient presents for follow-up. Grafix was placed  to the left foot wound at last clinic visit. He has been using Dakin's wet-to-dry dressings to the right foot wound. He continues HBO therapy without issues. He has shown improvement in wound healing to the left foot. 1/26; patient presents for follow-up. We been placing Grafix to the left foot wound and patient has been using Dakin's wet-to-dry dressings to the right foot wound. There is been improvement in wound healing. He continues HBO therapy without issues. 2/2; patient presents for follow-up. We have been using Grafix to the left foot wound and Dakin's wet-to-dry dressings to the right foot wound. He has no ZACARIA, GEGG [name] ([id number]) [id number] Page 9 of 13 issues or complaints today. Patient History Information obtained from Patient, Chart. Family History Diabetes - Mother,Siblings, Heart Disease - Siblings, Hypertension - Siblings, Stroke - Mother, No family history of Cancer, Hereditary Spherocytosis, Kidney Disease, Lung Disease, Seizures, Thyroid Problems, Tuberculosis. Social History Former smoker, Marital Status - Single, Alcohol Use - Never, Drug Use - No History, Caffeine Use - Daily. Medical History Eyes Denies history of Cataracts, Glaucoma, Optic Neuritis Respiratory Patient has history of Asthma, Sleep Apnea Denies history of Aspiration, Chronic Obstructive Pulmonary Disease (COPD), Pneumothorax, Tuberculosis Cardiovascular Patient has history of Hypertension, Peripheral Venous Disease Denies history of Angina, Arrhythmia, Congestive Heart Failure, Coronary Artery Disease, Deep Vein Thrombosis, Hypotension, Myocardial Infarction, Peripheral Arterial Disease, Phlebitis, Vasculitis Endocrine Patient has history of Type II Diabetes Denies history of Type I Diabetes Integumentary (Skin) Denies history of History of  Burn Musculoskeletal Patient has history of Osteomyelitis Denies history of Gout, Rheumatoid Arthritis, Osteoarthritis Neurologic Patient has history of Neuropathy Denies history of Dementia, Quadriplegia, Paraplegia, Seizure Disorder Hospitalization/Surgery History - 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 Posey Pronto. - aortogram 12/08/2021 Dr. Virl Cagey [facility]. Medical [name] Surgical History Notes nd Psychiatric PTSD Objective Constitutional respirations regular, non-labored and within target range for patient.. Vitals Time Taken: 8:41 AM, Height: 72 in, Weight: 220 lbs, BMI: 29.8, Temperature: 97.7 F, Pulse: 71 bpm, Respiratory Rate: 16 breaths/min, Blood Pressure: 142/84 mmHg, Capillary Blood Glucose: 85 mg/dl. Cardiovascular 2+ dorsalis pedis/posterior tibialis pulses. Psychiatric pleasant and cooperative. General Notes: Left foot: T the lateral aspect there is an open wound with granulation tissue and nonviable tissue. No probing to bone . Right foot: Along the o previous TMA site there is an open wound with nonviable tissue, granulation tissue and callus. There is increased depth In the center. Does not probe to bone. No signs of surrounding soft tissue infection. Integumentary (Hair, Skin) Wound #5 status is Open. Original cause of wound was Surgical Injury. The date acquired was: 12/20/2021. The wound has been in treatment 16 weeks. The wound is located on the Left,Lateral Foot. The wound measures 4.5cm length x 0.5cm width x 0.3cm depth; 1.767cm^2 area and 0.53cm^3 volume. There is Fat Layer (Subcutaneous Tissue) exposed. There is no tunneling or undermining noted. There is [name] medium amount of serosanguineous drainage noted. The wound margin is epibole. There is large (67-100%) red, pink granulation within the wound bed. There is [name] small (1-33%) amount of necrotic tissue within the wound bed including Adherent Slough. The periwound skin appearance had no abnormalities  noted for color. The periwound skin appearance exhibited: Callus, Maceration. The periwound skin appearance did not exhibit: Crepitus, Excoriation, Induration, Rash, Scarring, Dry/Scaly. Periwound temperature was noted as No Abnormality. Wound #6 status is Open. Original cause of wound was Gradually Appeared. The date acquired was: 07/16/2022. The wound has been in treatment 3 weeks. The  wound is located on the Right Amputation Site - Transmetatarsal. The wound measures 0.3cm length x 0.5cm width x 0.5cm depth; 0.118cm^2 area and 0.059cm^3 volume. There is Fat Layer (Subcutaneous Tissue) exposed. There is no tunneling or undermining noted. There is [name] medium amount of serosanguineous drainage noted. There is large (67-100%) red granulation within the wound bed. There is no necrotic tissue within the wound bed. The periwound skin appearance exhibited: Callus. The periwound skin appearance did not exhibit: Crepitus, Excoriation, Induration, Rash, Scarring, Dry/Scaly, Maceration, Atrophie Blanche, Cyanosis, Ecchymosis, Hemosiderin Staining, Mottled, Pallor, Rubor, Erythema. JULIUN, BRANDEL [name] ([id number]) [id number] Page 10 of 13 Assessment Active Problems ICD-10 Non-pressure chronic ulcer of other part of left foot with fat layer exposed Other acute osteomyelitis, left ankle and foot Non-pressure chronic ulcer of other part of left foot with other specified severity Type 2 diabetes mellitus with foot ulcer Type 2 diabetes mellitus with diabetic polyneuropathy Unspecified open wound, right foot, initial encounter 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 severity Patient's lateral left foot wound has shown improvement in size and appearance since last clinic visit. He has done excellent with Grafix and HBO therapy to help heal this. I debrided nonviable tissue here and Grafix was placed in standard fashion. His right foot wound is stable.  I debrided nonviable tissue here and recommended Dakin's wet-to-dry dressings. Procedures Wound #5 Pre-procedure diagnosis of Wound #5 is [name] Diabetic Wound/Ulcer of the Lower Extremity located on the Left,Lateral Foot .Severity of Tissue Pre Debridement is: Fat layer exposed. There was [name] Excisional Skin/Subcutaneous Tissue Debridement with [name] total area of 2.25 sq cm performed by [name], DO. With the following instrument(s): Curette to remove Viable and Non-Viable tissue/material. Material removed includes Callus, Subcutaneous Tissue, and Slough after achieving pain control using Lidocaine. No specimens were taken. [name] time out was conducted at 09:15, prior to the start of the procedure. [name] Minimum amount of bleeding was controlled with Pressure. The procedure was tolerated well with [name] pain level of 0 throughout and [name] pain level of 0 following the procedure. Post Debridement Measurements: 4.5cm length x 0.5cm width x 0.3cm depth; 0.53cm^3 volume. Character of Wound/Ulcer Post Debridement is improved. Severity of Tissue Post Debridement is: Fat layer exposed. Post procedure Diagnosis Wound #5: Same as Pre-Procedure Pre-procedure diagnosis of Wound #5 is [name] Diabetic Wound/Ulcer of the Lower Extremity located on the Left,Lateral Foot. [name] skin graft procedure using [name] bioengineered skin substitute/cellular or tissue based product was performed by [name], DO with the following instrument(s): Forceps and Scissors. Grafix prime was applied and secured with Steri-Strips. 6 sq cm of product was utilized and 0 sq cm was wasted. Post Application, adaptic, gauze was applied. [name] Time Out was conducted at 09:20, prior to the start of the procedure. The procedure was tolerated well with [name] pain level of 0 throughout and [name] pain level of 0 following the procedure. Post procedure Diagnosis Wound #5: Same as Pre-Procedure . Wound #6 Pre-procedure diagnosis of Wound #6 is [name] Diabetic Wound/Ulcer of the  Lower Extremity located on the Right Amputation Site - Transmetatarsal .Severity of Tissue Pre Debridement is: Fat layer exposed. There was [name] Excisional Skin/Subcutaneous Tissue Debridement with [name] total area of 0.15 sq cm performed by [name], DO. With the following instrument(s): Curette to remove Viable and Non-Viable tissue/material. Material removed includes Callus, Subcutaneous Tissue, and Slough after achieving pain control using Lidocaine. No specimens were taken. [name] time out was conducted at 09:15,  prior to the start of the procedure. [name] Minimum amount of bleeding was controlled with Pressure. The procedure was tolerated well with [name] pain level of 0 throughout and [name] pain level of 0 following the procedure. Post Debridement Measurements: 0.3cm length x 0.5cm width x 0.5cm depth; 0.059cm^3 volume. Character of Wound/Ulcer Post Debridement is improved. Severity of Tissue Post Debridement is: Fat layer exposed. Post procedure Diagnosis Wound #6: Same as Pre-Procedure Plan Follow-up Appointments: Return Appointment in 1 week. - Dr. Heber [place] Thursday 1015 08/13/2022 Return Appointment in 2 weeks. - w/ Dr. Heber [place] ( go ahead and make appt.) Anesthetic: (In clinic) Topical Lidocaine 5% applied to wound bed Cellular or Tissue Based Products: Cellular or Tissue Based Product Type: - Run IVR for Grafix for left dorsal foot- pending Grafix # 1 applied 06/11/22 Grafix # 2 applied 06/18/22 Grafix # 3 applied 06/25/22 GRafix # 4 applied 07/02/22 Grafix # 5 applied 07/10/22 Grafix # 6 applied 07/17/22 Grafix # 7 applied 07/24/22---Also, go ahead and run IVR for more Grafix for left lateral foot Grafix #8 applied 07/31/2022 Grafix # 9 applied 08/07/22 Cellular or Tissue Based Product applied to wound bed, secured with steri-strips, cover with Adaptic or Mepitel. (DO NOT REMOVE). Edema Control - Lymphedema / SCD / Other: Avoid standing for long periods of time. Off-Loading: Open toe surgical shoe to: -  wear when standing or walking. Hyperbaric Oxygen Therapy: Evaluate for HBO Therapy - 07/24/22- Pt. to finish next 8 HBO treatments 07/24/22- DR. Kitiara Hintze wants pt. to be extended for 40 more treatments Indication: - Wagner Grade III to left lateral foot 2.5 ATA for 90 Minutes with 2 Five (5) Minute Air Breaks T Number of Treatments: - 40; extend for 40 more otal One treatments per day (delivered Monday through Friday unless otherwise specified in Special Instructions below): Finger stick Blood Glucose Pre- and Post- HBOT Treatment. Follow Hyperbaric Oxygen Glycemia Protocol Afrin (Oxymetazoline HCL) 0.05% nasal spray - 1 spray in both nostrils daily as needed prior to HBO treatment for difficulty clearing ears WOUND #5: - Foot Wound Laterality: Left, Lateral Cleanser: Wound Cleanser (Generic) 1 x Per Day/30 Days Discharge Instructions: Cleanse the wound with wound cleanser prior to applying [name] clean dressing using gauze sponges, not tissue or cotton balls. Peri-Wound Care: Skin Prep (Generic) 1 x Per Day/30 Days Discharge Instructions: Use skin prep as directed RICKARD, WICKERS [name] ([id number]) [phone].pdf Page 11 of 13 Prim Dressing: Grafix (Generic) 1 x Per Day/30 Days ary Secondary Dressing: ABD Pad, 8x10 (Generic) 1 x Per Day/30 Days Discharge Instructions: Apply over primary dressing as directed. Secured With: [employer], 4.5x3.1 (in/yd) (Generic) 1 x Per Day/30 Days Discharge Instructions: Secure with Kerlix as directed. Secured With: 92M Medipore H Soft Cloth Surgical T ape, 4 x 10 (in/yd) (Generic) 1 x Per Day/30 Days Discharge Instructions: Secure with tape as directed. WOUND #6: - Amputation Site - Transmetatarsal Wound Laterality: Right Cleanser: Wound Cleanser (Generic) 1 x Per Day/30 Days Discharge Instructions: Cleanse the wound with wound cleanser prior to applying [name] clean dressing using gauze sponges, not tissue or cotton balls. Prim  Dressing: Dakin's Solution 0.25%, 16 (oz) 1 x Per Day/30 Days ary Discharge Instructions: Moisten gauze with Dakin's solution Secondary Dressing: ABD Pad, 8x10 1 x Per Day/30 Days Discharge Instructions: Apply over primary dressing as directed. Secondary Dressing: Woven Gauze Sponge, Non-Sterile 4x4 in (Generic) 1 x Per Day/30 Days Discharge Instructions: Apply over primary dressing as directed. Secured With: [employer], 4.5x3.1 (in/yd) (  Generic) 1 x Per Day/30 Days Discharge Instructions: Secure with Kerlix as directed. 1. In office sharp debridement 2. Grafix placed in standard fashion 3. Aggressive offloadingoosurgical shoes 4. Continue HBO therapy 5. Dakin's wet-to-dry dressings 6. Follow-up in 1 week Electronic Signature(s) Signed: 08/07/2022 11:56:15 AM By: [name] DO Entered By: [name] on 08/07/2022 09:33:40 -------------------------------------------------------------------------------- HxROS Details Patient Name: Date of Service: [name], [name] 08/07/2022 11:00 [name] Medical Record Number: [id number] Patient Account Number: [id number] Date of Birth/Sex: Treating RN: 1955/12/31 (67 y.o. M) Primary Care Provider: [name] Other Clinician: Referring Provider: Treating Provider/Extender: [name] in Treatment: 16 Information Obtained From Patient Chart Eyes Medical History: Negative for: Cataracts; Glaucoma; Optic Neuritis Respiratory Medical History: Positive for: Asthma; Sleep Apnea Negative for: Aspiration; Chronic Obstructive Pulmonary Disease (COPD); Pneumothorax; Tuberculosis Cardiovascular Medical History: Positive for: Hypertension; Peripheral Venous Disease Negative for: Angina; Arrhythmia; Congestive Heart Failure; Coronary Artery Disease; Deep Vein Thrombosis; Hypotension; Myocardial Infarction; Peripheral Arterial Disease; Phlebitis; Vasculitis Endocrine Medical History: Positive for: Type II  Diabetes Negative for: Type I Diabetes Time with diabetes: 20 years Treated with: Insulin, Oral agents Blood sugar tested every day: Yes Tested : 4x day Integumentary (Skin) Medical History: Negative for: History of Burn SEAVER, FOSSETT [name] ([id number]) [phone].pdf Page 12 of 13 Musculoskeletal Medical History: Positive for: Osteomyelitis Negative for: Gout; Rheumatoid Arthritis; Osteoarthritis Neurologic Medical History: Positive for: Neuropathy Negative for: Dementia; Quadriplegia; Paraplegia; Seizure Disorder Psychiatric Medical History: Past Medical History Notes: PTSD Immunizations Pneumococcal Vaccine: Received Pneumococcal Vaccination: No Implantable Devices None Hospitalization / Surgery History Type of Hospitalization/Surgery 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 Posey Pronto aortogram 12/08/2021 Dr. Virl Cagey [facility] Family and Social History Cancer: No; Diabetes: Yes - Mother,Siblings; Heart Disease: Yes - Siblings; Hereditary Spherocytosis: No; Hypertension: Yes - Siblings; Kidney Disease: No; Lung Disease: No; Seizures: No; Stroke: Yes - Mother; Thyroid Problems: No; Tuberculosis: No; Former smoker; Marital Status - Single; Alcohol Use: Never; Drug Use: No History; Caffeine Use: Daily; Financial Concerns: No; Food, Clothing or Shelter Needs: No; Support System Lacking: No; Transportation Concerns: No Electronic Signature(s) Signed: 08/07/2022 11:56:15 AM By: [name] DO Entered By: [name] on 08/07/2022 09:31:10 -------------------------------------------------------------------------------- SuperBill Details Patient Name: Date of Service: [name], [name] 08/07/2022 Medical Record Number: [id number] Patient Account Number: [id number] Date of Birth/Sex: Treating RN: 1956-01-07 (67 y.o. [name], [name] Primary Care Provider: [name] Other Clinician: Referring Provider: Treating Provider/Extender:  [name] in Treatment: 16 Diagnosis Coding ICD-10 Codes Code Description [phone] Non-pressure chronic ulcer of other part of left foot with fat layer exposed M86.172 Other acute osteomyelitis, left ankle and foot L97.528 Non-pressure chronic ulcer of other part of left foot with other specified severity E11.621 Type 2 diabetes mellitus with foot ulcer E11.42 Type 2 diabetes mellitus with diabetic polyneuropathy S91.301A Unspecified open wound, right foot, initial encounter 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 severity Facility Procedures : CPT4 Code: [id number] Description: Q4133- Grafix PL 2X3 sq cm (6 units) Modifier: Quantity: 6 : Korzeniewski, CPT4 Code: [id number] Windel [name] ([id number] E1 Description: [id number] - SKIN SUB GRAFT FACE/NK/HF/G ICD-10 Diagnosis Description L97.522 Non-pressure chronic ulcer of other part of left foot with fat layer exposed 0) [id number] 1.621 Type 2 diabetes mellitus with foot ulcer Modifier: [id number]. Quantity: 1 pdf Page 13 of 13 : CPT4 Code: [id number] ICD L9 E1 Description: 42 - DEB SUBQ TISSUE 20 SQ CM/< -  10 Diagnosis Description 7.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 severity 1.621 Type 2 diabetes mellitus with foot ulcer Modifier: 1 Quantity: Physician Procedures : CPT4 Code Description Modifier [id number] - WC PHYS SKIN SUB GRAFT FACE/NK/HF/G ICD-10 Diagnosis Description L97.522 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.621 Type 2 diabetes mellitus with foot ulcer Quantity: 1 : [id number] - WC PHYS SUBQ TISS 20 SQ CM ICD-10 Diagnosis Description 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 severity E11.621 Type 2 diabetes mellitus with foot ulcer Quantity: 1 Electronic Signature(s) Signed: 08/07/2022 11:56:15 AM By: [name]  DO Entered By: [name] on 08/07/2022 09:38:31

## 2022-09-12 NOTE — Progress Notes (Signed)
[name], [name] ([id number]) [id number] Page 1 of 2 Visit Report for 09/11/2022 HBO Details Patient Name: Date of Service: [name], [name] 09/11/2022 8:00 [name] Medical Record Number: [id number] Patient Account Number: [id number] Date of Birth/Sex: Treating RN: [birth year] (67 y.o. [name] Primary Care Mayrin Schmuck: [name] Other Clinician: [name] Referring [place] Hanlon: Treating [name]/Extender: [name] in Treatment: 21 HBO Treatment Course Details Treatment Course Number: 1 Ordering Saranya Harlin: [name] T Treatments Ordered: otal 80 HBO Treatment Start Date: 06/01/2022 HBO Indication: Diabetic Ulcer(s) of the Lower Extremity Standard/Conservative Wound Care tried and failed greater than or equal to 30 days HBO Treatment Details Treatment Number: 63 Patient Type: Outpatient Chamber Type: Monoplace Chamber Serial #: [id number] Treatment Protocol: 2.5 ATA with 90 minutes oxygen, with two 5 minute air breaks Treatment Details Compression Rate Down: 1.5 psi / minute De-Compression Rate Up: 2.0 psi / minute [name] breaks and breathing ir Compress Tx Pressure periods Decompress Decompress Begins Reached (leave unused spaces Begins Ends blank) Chamber Pressure (ATA 1 2.5 2.5 2.5 2.5 2.5 - - 2.5 1 ) Clock Time (24 hr) 08:03 08:16 08:46 08:51 09:21 09:26 - - 09:56 10:07 Treatment Length: 124 (minutes) Treatment Segments: 4 Vital Signs Capillary Blood Glucose Reference Range: 80 - 120 mg / dl HBO Diabetic Blood Glucose Intervention Range: <131 mg/dl or >249 mg/dl Type: Time Vitals Blood Pulse: Respiratory Temperature: Capillary Blood Glucose Pulse Action Taken: Pressure: Rate: Glucose (mg/dl): Meter #: Oximetry (%) Taken: Pre 07:32 153/82 98 18 98.8 176 1 none per protocol Post 10:15 170/76 86 18 97.5 93 1 Patient given 8 oz Glucerna Shake, per protocol. Treatment Response Treatment Toleration:  Well Treatment Completion Status: Treatment Completed without Adverse Event Treatment Notes Patient arrived, blood glucose was 176 mg/dL. Mr. [name] stated that he didn't feel like eating today but did and felt "stuffed". Other vitals were normal, patient prepared for treatment. After performing safety check, patient was placed in the chamber being compressed at 2 psi/min after confirming normal ear equalization until reaching treatment depth of 2.5 ATA. Patient tolerated treatment and subsequent decompression at 2 psi/min. Pos-treatment blood glucose level was 93 mg/dL. 8 oz Glucerna was given to the patient. After patient prepared for departure, he denied symptoms of hypoglycemia and appeared asymptomatic as well. Patient was stable upon discharge. Additional Procedure Documentation Tissue Sevierity: Necrosis of bone Physician HBO Attestation: I certify that I supervised this HBO treatment in accordance with Medicare guidelines. [name] trained emergency response team is readily available per Yes hospital policies and procedures. Continue HBOT as ordered. Yes Electronic Signature(s) Signed: 09/15/2022 3:47:22 PM By: [name] DO Previous Signature: 09/11/2022 2:21:58 PM Version By: [name] CHT EMT BS , , Previous Signature: 09/11/2022 9:17:51 AM Version By: [name] CHT EMT BS , , Entered By: [name] on 09/15/2022 09:11:08 [name] ([id number] Page 2 of 2 -------------------------------------------------------------------------------- HBO Safety Checklist Details Patient Name: Date of Service: [name], [name] 09/11/2022 8:00 [name] Medical Record Number: [id number] Patient Account Number: [id number] Date of Birth/Sex: Treating RN: [birth year] (67 y.o. [name] Primary Care Chariti Havel: [name] Other Clinician: [name] Referring Ersilia Brawley: Treating Rayansh Herbst/Extender: [name] in Treatment: 21 HBO Safety Checklist Items Safety Checklist Consent Form Signed Patient voided / foley secured and emptied When did you last eato 0645 Last dose of injectable or oral agent 0600 Ostomy pouch emptied and vented if applicable NA  All implantable devices assessed, documented and approved NA Intravenous access site secured and place NA Valuables secured Linens and cotton and cotton/polyester blend (less than 51% polyester) Personal oil-based products / skin lotions / body lotions removed Wigs or hairpieces removed NA Smoking or tobacco materials removed NA Books / newspapers / magazines / loose paper removed Cologne, aftershave, perfume and deodorant removed Jewelry removed (may wrap wedding band) Make-up removed Hair care products removed Battery operated devices (external) removed Heating patches and chemical warmers removed Titanium eyewear removed Nail polish cured greater than 10 hours NA Casting material cured greater than 10 hours NA Hearing aids removed NA Loose dentures or partials removed dentures removed Prosthetics have been removed NA Patient demonstrates correct use of air break device (if applicable) Patient concerns have been addressed Patient grounding bracelet on and cord attached to chamber Specifics for Inpatients (complete in addition to above) Medication sheet sent with patient NA Intravenous medications needed or due during therapy sent with patient NA Drainage tubes (e.g. nasogastric tube or chest tube secured and vented) NA Endotracheal or Tracheotomy tube secured NA Cuff deflated of air and inflated with saline NA Airway suctioned NA Notes Paper version used prior to treatment start. Electronic Signature(s) Signed: 09/11/2022 9:12:20 AM By: [name] CHT EMT BS , , Entered By: [name] on 09/11/2022 09:12:19

## 2022-09-12 NOTE — Progress Notes (Signed)
[name], HUE [name] ([id number]) [id number] Page 1 of 14 Visit Report for 09/10/2022 Chief Complaint Document Details Patient Name: Date of Service: [name], [name] [name] 09/10/2022 10:15 [name] Medical Record Number: [id number] Patient Account Number: [id number] Date of Birth/Sex: Treating RN: [birth year] (67 y.o. M) Primary Care Provider: [name] Other Clinician: Referring Provider: Treating Provider/Extender: [name], [name] in Treatment: 21 Information Obtained from: Patient Chief Complaint 10/16/20; patient is here with [name] substantial wound on his right foot in the setting of [name] recent transmetatarsal amputation. 04/14/2022; referral from podiatry for postsurgical wound of nonhealing partial left fifth ray amputation, right foot (07/16/22): TMA dehiscence Electronic Signature(s) Signed: 09/10/2022 12:51:47 PM By: [name] DO Entered By: [name] on 09/10/2022 12:41:47 -------------------------------------------------------------------------------- Cellular or Tissue Based Product Details Patient Name: Date of Service: [name], [name] 09/10/2022 10:15 [name] Medical Record Number: [id number] Patient Account Number: [id number] Date of Birth/Sex: Treating RN: 02/03/1956 (67 y.o. [name], [name] Primary Care Provider: [name] Other Clinician: Referring Provider: Treating Provider/Extender: [name] in Treatment: 21 Cellular or Tissue Based Product Type Wound #5 Left,Lateral Foot Applied to: Performed By: Physician [name], DO Cellular or Tissue Based Product Type: Grafix prime Level of Consciousness (Pre-procedure): Awake and Alert Pre-procedure Verification/Time Out Yes - 11:10 Taken: Location: genitalia / hands / feet / multiple digits Wound Size (sq cm): 0.54 Product Size (sq cm): 1 Waste Size (sq cm): 0 Amount of Product Applied (sq cm): 1 Instrument Used: Forceps,  Scissors Lot #: [id number] Expiration Date: 11/18/2023 Fenestrated: No Reconstituted: Yes Solution Type: NS Solution Amount: 3m Lot #: [id number] Expiration Date: 11/02/2024 Secured: Yes Secured With: Steri-Strips Dressing Applied: Yes Primary Dressing: adaptic Procedural Pain: 0 Post Procedural Pain: 0 Response to Treatment: Procedure was tolerated well Level of Consciousness (Post- Awake and Alert procedure): Post Procedure Diagnosis Same as Pre-procedure Electronic Signature(s) Signed: 09/10/2022 12:51:47 PM By: HKalman ShanDO Signed: 09/10/2022 4:33:02 PM By: BRhae HammockRN TEvlyn Clines([id number] Page 2 of 14 Entered By: BRhae Hammockon 09/10/2022 11:12:00 -------------------------------------------------------------------------------- Cellular or Tissue Based Product Details Patient Name: Date of Service: [name] [name] 09/10/2022 10:15 [name] Medical Record Number: [id number] Account Number: [id number] of Birth/Sex: Treating RN: 1Dec 10, 1957(67y.o. MBurnadette Pop [name] Primary Care Provider: BRoselee NovaOther Clinician: Referring Provider: Treating Provider/Extender: HBurgess Amorin Treatment: 21 Cellular or Tissue Based Product Type Wound #6 Right Amputation Site - Transmetatarsal Applied to: Performed By: Physician HKalman Shan DO Cellular or Tissue Based Product Type: Grafix prime Level of Consciousness (Pre-procedure): Awake and Alert Pre-procedure Verification/Time Out Yes - 11:10 Taken: Location: genitalia / hands / feet / multiple digits Wound Size (sq cm): 0.04 Product Size (sq cm): 2 Waste Size (sq cm): 1 Waste Reason: wound size; applied to other wound Amount of Product Applied (sq cm): 1 Instrument Used: Forceps, Scissors Lot #: [id number] Date: 11/18/2023 Fenestrated: No Reconstituted: Yes Solution Type: NS Solution Amount:  368mLot #: [id number] Expiration Date: 11/02/2024 Secured: Yes Secured With: Steri-Strips Dressing Applied: Yes Primary Dressing: adaptic Procedural Pain: 0 Post Procedural Pain: 0 Response to Treatment: Procedure was tolerated well Level of Consciousness (Post- Awake and Alert procedure): Post Procedure Diagnosis Same as Pre-procedure Electronic Signature(s) Signed: 09/10/2022 12:51:47 PM By: HoKalman ShanO Signed: 09/10/2022 4:33:02 PM By: BrRhae HammockN Entered By: BrRhae Hammockn 09/10/2022 11:12:14 -------------------------------------------------------------------------------- Debridement Details Patient  Name: Date of Service: [name], [name] [name] 09/10/2022 10:15 [name] Medical Record Number: [id number] Patient Account Number: [id number] Date of Birth/Sex: Treating RN: March 15, 1956 (67 y.o. [name], [name] Primary Care Provider: [name] Other Clinician: Referring Provider: Treating Provider/Extender: [name] in Treatment: 21 Debridement Performed for Assessment: Wound #6 Right Amputation Site - Transmetatarsal Performed By: Physician [name], DO Debridement Type: Debridement Severity of Tissue Pre Debridement: Fat layer exposed Level of Consciousness (Pre-procedure): Awake and Alert Pre-procedure Verification/Time Out Yes - 11:05 Taken: Start Time: 11:05 Pain Control: Lidocaine T Area Debrided (L x W): otal 0.5 (cm) x 0.5 (cm) = 0.25 (cm) Tissue and other material debrided: Viable, Non-Viable, [name] Subcutaneous, [place], [place] [name] ([id number]) [phone].pdf Page 3 of 14 Level: Skin/Subcutaneous Tissue Debridement Description: Excisional Instrument: Curette Bleeding: Minimum Hemostasis Achieved: Pressure End Time: 11:05 Procedural Pain: 0 Post Procedural Pain: 0 Response to Treatment: Procedure was tolerated well Level of Consciousness (Post- Awake and  Alert procedure): Post Debridement Measurements of Total Wound Length: (cm) 0.2 Width: (cm) 0.2 Depth: (cm) 0.5 Volume: (cm) 0.016 Character of Wound/Ulcer Post Debridement: Improved Severity of Tissue Post Debridement: Fat layer exposed Post Procedure Diagnosis Same as Pre-procedure Electronic Signature(s) Signed: 09/10/2022 12:51:47 PM By: [name] DO Signed: 09/10/2022 4:33:02 PM By: Rhae Hammock RN Entered By: Rhae Hammock on 09/10/2022 11:10:04 -------------------------------------------------------------------------------- Debridement Details Patient Name: Date of Service: [name], [name] 09/10/2022 10:15 [name] Medical Record Number: [id number] Patient Account Number: [id number] Date of Birth/Sex: Treating RN: 10/01/1955 (67 y.o. [name], [name] Primary Care Provider: [name] Other Clinician: Referring Provider: Treating Provider/Extender: [name] in Treatment: 21 Debridement Performed for Assessment: Wound #5 Left,Lateral Foot Performed By: Physician [name], DO Debridement Type: Debridement Severity of Tissue Pre Debridement: Fat layer exposed Level of Consciousness (Pre-procedure): Awake and Alert Pre-procedure Verification/Time Out Yes - 11:05 Taken: Start Time: 11:05 Pain Control: Lidocaine T Area Debrided (L x W): otal 2.7 (cm) x 0.2 (cm) = 0.54 (cm) Tissue and other material debrided: Viable, Non-Viable, Slough, Slough Level: Non-Viable Tissue Debridement Description: Selective/Open Wound Instrument: Curette Bleeding: Minimum Hemostasis Achieved: Pressure End Time: 11:05 Procedural Pain: 0 Post Procedural Pain: 0 Response to Treatment: Procedure was tolerated well Level of Consciousness (Post- Awake and Alert procedure): Post Debridement Measurements of Total Wound Length: (cm) 2.7 Width: (cm) 0.2 Depth: (cm) 0.4 Volume: (cm) 0.17 Character of Wound/Ulcer Post Debridement:  Improved Severity of Tissue Post Debridement: Fat layer exposed Post Procedure Diagnosis Same as Pre-procedure Electronic Signature(s) DONTA, STAGGERS [name] ([id number]) [id number].pdf Page 4 of 14 Signed: 09/10/2022 12:51:47 PM By: [name] DO Signed: 09/10/2022 4:33:02 PM By: Rhae Hammock RN Entered By: Rhae Hammock on 09/10/2022 11:12:26 -------------------------------------------------------------------------------- HPI Details Patient Name: Date of Service: [name], [name] 09/10/2022 10:15 [name] Medical Record Number: [id number] Patient Account Number: [id number] Date of Birth/Sex: Treating RN: 06-05-1956 (67 y.o. M) Primary Care Provider: [name] Other Clinician: Referring Provider: Treating Provider/Extender: [name], [name] in Treatment: 21 History of Present Illness HPI Description: ADMISSION 10/16/20 This is [name] 67 year old man who is [name] type II diabetic. Initially seen by Dr. Unk Lightning of vein and vascular on 09/10/2021 for bilateral lower extremity rest pain right greater than left. His ABIs demonstrated monophasic waveforms at the ankles bilaterally with depressed toe pressures. His ABI in the right was 0.5 and on the left 0.28. There were no obtainable waveforms for TBI's. He  underwent angiography on 09/10/2021 and had findings of severe PAD with 95% stenosis of the distal SFA. He also had peroneal and posterior tibial arteries occluded with significant collateralization in the calf there was reconstitution of the posterior tibial artery in the distal third of the calf. Ostia of the anterior tibial artery was not appreciated. He underwent [name] angioplasty of the superficial femoral artery. He required admission to hospital from 09/12/2021 through 09/22/2021 with right foot cellulitis and sepsis. On 3/13 he underwent [name] right below-knee popliteal to posterior tibial bypass using [name] greater saphenous vein. Ultimately however he  required [name] right TMA by podiatry which I believe was done on 3/17 for progressive diabetic foot infection. He was discharged to [facility] skilled facility. He arrives in clinic today with [name] large wound area over the entirety of his amputation site extending proximally. The attempted flap closure of the amputation site and his TMA has failed and the wound extends under the attempt to closure. Still some sutures in place. There is an odor but he is not systemically unwell. He is due for follow-up arterial studies and has an appointment with vascular surgery on 10/28/2021. They are using wet-to-dry dressings at [facility]. Past medical history includes type 2 diabetes with peripheral neuropathy, hypertension, obstructive sleep apnea 4/20; patient presents for follow-up. He is being discharged from his facility at [facility] today. They have been doing Dakin's wet-to-dry dressings. He states that his fiance can help with dressing changes. He is getting [facility] home [facility] to come out 3 times [name] week once he leaves the facility. He is scheduled to see vein and vascular on 5/12. He has not seen the wound himself. He puts covers over his face for wound exams. He currently denies systemic signs of infection. 4/28; patient admitted to the clinic 2 weeks ago with [name] dehisced right TMA in the setting of type 2 diabetes with significant PAD. He is status post revascularization. He has been discharged from the nursing home he was at and now is at home using Dakin's wet-to-dry dressings daily he has home health. He denies any systemic signs of infection 5/4; patient presents for follow-up. His fiance and home health have been changing his dressings. He currently denies systemic signs of infection. He continues to put sheet covers over his face during our wound encounters because he does not want [name] look at the wound. 5/25; Patient presents for follow-up. He missed his last clinic appointment. He had [name] bone biopsy and  culture done at last clinic visit that showed acute osteomyelitis with growth of E. coli, stenotrophomonas maltophilia and enteroccocus faecalis. [name] referral to infectious disease was made. He has not heard from them yet. He has been using Dakin's wet-to-dry dressings. He is now more comfortable with looking at the wound bed. 6/6; the patient comes into clinic today with 2 wounds on the left foot which apparently have been there for several months. This was not known to assess but was known by Dr. Donzetta Matters. In fact he underwent an angiogram yesterday. He had [name] laser atherectomy of the left posterior tibial artery with [name] 3 mm balloon angioplasty, also [name] stent of the last left SFA. 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 This is [name] lot better than the last time I saw it. He has been using Dakin's  wet-to-dry. He has an appointment with infectious disease for [name] bone biopsy which showed E. coli, stenotrophomonas and Enterococcus faecalis. The appointment is for tomorrow 04/14/2022 Mr. Hughey Strehle ajuddin is an uncontrolled type 2 diabetic on insulin that presents with [name] left foot ulcer. On 12/18/2021 he required [name] left fifth ray amputation. He had revision of this site on 6/17 by Dr. Posey Pronto, podiatry. He states he has had [name] wound VAC on this area for the past 3 weeks. It is unclear what the prior wound care has been. He has [name] history of osteomyelitis to this area and was treated with IV and oral antibiotics For 6 weeks by Dr. Linus Salmons of infectious disease. At his last follow-up on 02/20/2022 his CRP was trending down and antibiotics were stopped. He also has [name] history of peripheral arterial disease status post stenting to the left SFA and balloon angioplasty to the left posterior tibial artery On 12/2021. He has follow-up with vein and vascular at the end of the  month. He also has [name] wound to the dorsal aspect of the left foot that he has been placing Betadine on. 10/19; patient presents for follow-up. He had [name] bone biopsy of the left foot at last clinic visit showed osteomyelitis. The bone culture showed Enterobacter cloacae, Enterococcus faecalis, Staphylococcus aureus, viridans group strep, actinotigum schaelii. He has been referred to infectious disease And has an appointment on October 25. He has been using Dakin's wet-to-dry dressing to the lateral left foot wound and Hydrofera Blue and Medihoney to the dorsal foot wound. He has no issues or complaints today. He denies systemic signs of infection. 10/26; patient presents for follow-up. He saw Dr. Linus Salmons yesterday with infectious disease and has been prescribed doxycycline and Levaquin for his chronic osteomyelitis. He has been using Dakin's wet-to-dry dressings to the lateral left foot wound and Medihoney and Hydrofera Blue to the dorsal wound. We discussed [name] skin graft for the dorsal foot wound and he would like to proceed with insurance verification. 11/3; patient with [name] wound on the dorsal left foot and [name] substantial postsurgical area on the lateral left foot fifth ray amputation. He is also followed by Dr. Alton Revere of infectious disease. He had [name] bone biopsy from the left lateral foot that showed acute osteomyelitis. Swab culture showed multiple organisms. He has also been revascularized by infectious disease status post left SFA stenting and tibial laser atherectomy and angioplasty. An MRI of the left foot had previously shown osteomyelitis of the fifth met head we have been using Dakin's wet-to-dry to the left lateral foot and Medihoney and Hydrofera Blue to the wound on the dorsal foot. 11/13; HBO treatment was discussed at last clinic visit and patient would like to proceed with this. He has orientation today. He has been approved for Grafix. We have been using Hydrofera Blue and Medihoney to the  left dorsal foot and collagen to the left lateral foot. Patient has home health that comes in for dressing changes. He currently has no issues or complaints today. [name], [name] ([id number]) [id number] Page 5 of 14 12/7; patient missed his last clinic appointment. He was last seen 3 weeks ago. He is currently off of antibiotics per ID. He completed [name] prolonged treatment. He has been using collagen to the lateral foot wound. He has been using Medihoney and Hydrofera Blue to the dorsal foot wound. He has been doing well with hyperbaric oxygen therapy. He has no issues or complaints today. He denies signs of of infection.  12/14; patient presents for follow-up. We have been using Grafix to the wound beds. He has had trouble equalizing the pressure in his ears during HBO treatments. It was recommended he follow-up with ENT He does not have an appointment yet. He currently denies signs of infection. . 12/21; patient presents for follow-up. He was seen by ENT and had tubes placed. He is continued HBO without issues. We have been using Grafix to the wound beds. There is been improvement in wound healing. 12/21; patient presents for follow-up. He has no issues or complaints today. There is been improvement in wound healing. 1/5; patient presents for follow-up. At last clinic visit Grafix was placed in standard fashion. He has no issues or complaints today. He is tolerating hyperbaric oxygen therapy well. He denies signs of infection. 1/11; patient presents for follow-up. We have been placing Grafix to the wound beds on the left foot. The dorsal left foot wound has healed. The left lateral foot wound has shown improvement in healing. Unfortunately has had dehiscence of his previous surgical wound site on the right foot. He currently denies signs of infection. 1/19; patient presents for follow-up. Grafix was placed to the left foot wound at last clinic visit. He has been using  Dakin's wet-to-dry dressings to the right foot wound. He continues HBO therapy without issues. He has shown improvement in wound healing to the left foot. 1/26; patient presents for follow-up. We been placing Grafix to the left foot wound and patient has been using Dakin's wet-to-dry dressings to the right foot wound. There is been improvement in wound healing. He continues HBO therapy without issues. 2/2; patient presents for follow-up. We have been using Grafix to the left foot wound and Dakin's wet-to-dry dressings to the right foot wound. He has no issues or complaints today. 2/8; patient presents for follow-up. We have been using Grafix to the left foot wound and Dakin's wet-to-dry dressings to the right foot wound. He has no issues or complaints today. He continues HBO without issues. 2/15; patient presents for follow-up. We have been using Grafix to the wound beds. He continues HBO without issues. He has been offloading the wound beds with his surgical shoe. 2/22; patient presents for follow-up. We have been using Grafix to the wound beds. He has no issues or complaints today. He continues HBO therapy without issues. 2/29; patient presents for follow-up. We have been using Grafix to the wound beds. There is been improvement in wound healing. He has no issues or complaints today. He continues HBO therapy. 3/7; patient presents for follow-up. We have been using Grafix to the wound beds. Yesterday patient had an arteriogram to the right lower extremity with [name] drug-eluting stent placed in the superficial femoral artery. He has no issues to report on today. Electronic Signature(s) Signed: 09/10/2022 12:51:47 PM By: [name] DO Entered By: [name] on 09/10/2022 12:44:42 -------------------------------------------------------------------------------- Physical Exam Details Patient Name: Date of Service: [name], [name] 09/10/2022 10:15 [name] Medical Record Number:  [id number] Patient Account Number: [id number] Date of Birth/Sex: Treating RN: 1955-11-15 (67 y.o. M) Primary Care Provider: [name] Other Clinician: Referring Provider: Treating Provider/Extender: [name], [name] in Treatment: 21 Constitutional respirations regular, non-labored and within target range for patient.. Cardiovascular 2+ dorsalis pedis/posterior tibialis pulses. Psychiatric pleasant and cooperative. Notes Left foot: T the lateral aspect there is an open wound with granulation tissue and nonviable tissue. No probing to bone . Right foot: Along the previous TMA o  site there is an open wound with nonviable tissue, granulation tissue and callus. Does not probe to bone. No signs of surrounding soft tissue infection. Electronic Signature(s) Signed: 09/10/2022 12:51:47 PM By: [name] DO Entered By: [name] on 09/10/2022 12:45:34 [name] ([id number] Page 6 of 14 -------------------------------------------------------------------------------- Physician Orders Details Patient Name: Date of Service: [name], [name] [name] 09/10/2022 10:15 [name] Medical Record Number: [id number] Patient Account Number: [id number] Date of Birth/Sex: Treating RN: Sep 15, 1955 (67 y.o. [name], [name] Primary Care Provider: [name] Other Clinician: Referring Provider: Treating Provider/Extender: [name] in Treatment: 21 Verbal / Phone Orders: No Diagnosis Coding Follow-up Appointments ppointment in 1 week. - Dr. Heber [place] Thursday 09/17/2022 ROOM 8 1015 AFTER HYPERBARIC Return [name] ppointment in 2 weeks. - Dr. Heber [place] and Allayne Butcher Rm # 9 Thursday 09/27/22 @ 10:30 Return [name] Other: - ****ONLY CHANGE OUTTER DRESSINGS ONLY.*** Anesthetic (In clinic) Topical Lidocaine 5% applied to wound bed Cellular or Tissue Based Products Cellular or Tissue Based Product Type: - Run IVR for Grafix  for left dorsal foot- pending Grafix # 1 applied 06/11/22 Grafix # 2 applied 06/18/22 Grafix # 3 applied 06/25/22 GRafix # 4 applied 07/02/22 Grafix # 5 applied 07/10/22 Grafix # 6 applied 07/17/22 Grafix # 7 applied 07/24/22---Also, go ahead and run IVR for more Grafix for left lateral foot Grafix #8 applied 07/31/2022 Grafix # 9 applied 08/07/22] left foot Grafix #10 applied 08/13/2022 left and right foot wounds today split the grafix in half. Graftx #11 applied to both wounds. Patient right foot approved for total of 12 between dates 08/18/2022-11/04/2022. Grafix #12 applied to both wounds. grafix #4 right foot split grafix and use to left foot #13. total can have 24. Grafix # 5 applied to right foot split grafix and use to left foot # 14. Cellular or Tissue Based Product applied to wound bed, secured with steri-strips, cover with Adaptic or Mepitel. (DO NOT REMOVE). Edema Control - Lymphedema / SCD / Other Avoid standing for long periods of time. Off-Loading Open toe surgical shoe to: - wear when standing or walking. Hyperbaric Oxygen Therapy Evaluate for HBO Therapy - 07/24/22- Pt. to finish next 8 HBO treatments 07/24/22- DR. Nareh Matzke wants pt. to be extended for 40 more treatments Indication: - Wagner Grade III to left lateral foot 2.5 ATA for 90 Minutes with 2 Five (5) Minute [name] Breaks ir Total Number of Treatments: - 40; extend for 40 more= 80 treatments One treatments per day (delivered Monday through Friday unless otherwise specified in Special Instructions below): Finger stick Blood Glucose Pre- and Post- HBOT Treatment. Follow Hyperbaric Oxygen Glycemia Protocol Afrin (Oxymetazoline HCL) 0.05% nasal spray - 1 spray in both nostrils daily as needed prior to HBO treatment for difficulty clearing ears Wound Treatment Wound #5 - Foot Wound Laterality: Left, Lateral Cleanser: Wound Cleanser (Generic) 1 x Per Day/30 Days Discharge Instructions: Cleanse the wound with wound cleanser  prior to applying [name] clean dressing using gauze sponges, not tissue or cotton balls. Peri-Wound Care: Skin Prep (Generic) 1 x Per Day/30 Days Discharge Instructions: Use skin prep as directed Prim Dressing: Grafix (Generic) 1 x Per Day/30 Days ary Discharge Instructions: applied by provider. Prim Dressing: adaptic and steri-strips 1 x Per Day/30 Days ary Discharge Instructions: to secure grafix to wound bed. leave in place. Secondary Dressing: ABD Pad, 8x10 (Generic) 1 x Per Day/30 Days Discharge Instructions: Apply over primary dressing as directed. Secured With: [employer], 4.5x3.1 (  in/yd) (Generic) 1 x Per Day/30 Days Discharge Instructions: Secure with Kerlix as directed. Secured With: 59M Medipore H Soft Cloth Surgical T ape, 4 x 10 (in/yd) (Generic) 1 x Per Day/30 Days Discharge Instructions: Secure with tape as directed. Wound #6 - Amputation Site - Transmetatarsal Wound Laterality: Right Cleanser: Wound Cleanser (Generic) 1 x Per Day/30 Days [name], [name] ([id number]) [id number] Page 7 of 14 Discharge Instructions: Cleanse the wound with wound cleanser prior to applying [name] clean dressing using gauze sponges, not tissue or cotton balls. Peri-Wound Care: Skin Prep (Generic) 1 x Per Day/30 Days Discharge Instructions: Use skin prep as directed Prim Dressing: Grafix (Generic) 1 x Per Day/30 Days ary Discharge Instructions: applied by provider. Prim Dressing: adaptic and steri-strips 1 x Per Day/30 Days ary Discharge Instructions: to secure grafix to wound bed. leave in place. Prim Dressing: cutimed sorbact 1 x Per Day/30 Days ary Discharge Instructions: lightly pack into wound bed behind the grafix. Secondary Dressing: ABD Pad, 8x10 (Generic) 1 x Per Day/30 Days Discharge Instructions: Apply over primary dressing as directed. Secured With: [employer], 4.5x3.1 (in/yd) (Generic) 1 x Per Day/30 Days Discharge Instructions: Secure with  Kerlix as directed. Secured With: 59M Medipore H Soft Cloth Surgical T ape, 4 x 10 (in/yd) (Generic) 1 x Per Day/30 Days Discharge Instructions: Secure with tape as directed. GLYCEMIA INTERVENTIONS PROTOCOL PRE-HBO GLYCEMIA INTERVENTIONS ACTION INTERVENTION Obtain pre-HBO capillary blood glucose (ensure 1 physician order is in chart). [name]. Notify HBO physician and await physician orders. 2 If result is 70 mg/dl or below: B. If the result meets the hospital definition of [name] critical result, follow hospital policy. [name]. Give patient an 8 ounce Glucerna Shake, an 8 ounce Ensure, or 8 ounces of [name] Glucerna/Ensure equivalent dietary supplement*. B. Wait 30 minutes. If result is 71 mg/dl to 130 mg/dl: C. Retest patients capillary blood glucose (CBG). D. If result greater than or equal to 110 mg/dl, proceed with HBO. If result less than 110 mg/dl, notify HBO physician and consider holding HBO. If result is 131 mg/dl to 249 mg/dl: [name]. Proceed with HBO. [name]. Notify HBO physician and await physician orders. B. It is recommended to hold HBO and do If result is 250 mg/dl or greater: blood/urine ketone testing. C. If the result meets the hospital definition of [name] critical result, follow hospital policy. POST-HBO GLYCEMIA INTERVENTIONS ACTION INTERVENTION Obtain post HBO capillary blood glucose (ensure 1 physician order is in chart). [name]. Notify HBO physician and await physician orders. 2 If result is 70 mg/dl or below: B. If the result meets the hospital definition of [name] critical result, follow hospital policy. [name]. Give patient an 8 ounce Glucerna Shake, an 8 ounce Ensure, or 8 ounces of [name] Glucerna/Ensure equivalent dietary supplement*. B. Wait 15 minutes for symptoms of If result is 71 mg/dl to 100 mg/dl: hypoglycemia (i.e. nervousness, anxiety, sweating, chills, clamminess, irritability, confusion, tachycardia or dizziness). C. If patient asymptomatic, discharge patient. If patient  symptomatic, repeat capillary blood glucose (CBG) and notify HBO physician. If result is 101 mg/dl to 249 mg/dl: [name]. Discharge patient. [name]. Notify HBO physician and await physician orders. B. It is recommended to do blood/urine ketone If result is 250 mg/dl or greater: testing. C. If the result meets the hospital definition of [name] critical result, follow hospital policy. *Juice or candies are NOT equivalent products. If patient refuses the Glucerna or Ensure, please consult the hospital dietitian for an appropriate substitute. Electronic Signature(s) Signed: 09/10/2022 12:51:47 PM By: Heber Commerce,  Marshell Garfinkel [place], Docia Barrier [name] ([id number]) [id number] Page 8 of 14 Entered By: [name] on 09/10/2022 12:45:46 -------------------------------------------------------------------------------- Problem List Details Patient Name: Date of Service: [name], [name] [name] 09/10/2022 10:15 [name] Medical Record Number: [id number] Patient Account Number: [id number] Date of Birth/Sex: Treating RN: [birth year] (67 y.o. M) Primary Care Provider: [name] Other Clinician: Referring Provider: Treating Provider/Extender: [name] in Treatment: 21 Active Problems ICD-10 Encounter Code Description Active Date MDM Diagnosis L97.522 Non-pressure chronic ulcer of other part of left foot with fat layer exposed 04/14/2022 No Yes M86.172 Other acute osteomyelitis, left ankle and foot 05/08/2022 No Yes L97.528 Non-pressure chronic ulcer of other part of left foot with other specified 04/14/2022 No Yes severity E11.621 Type 2 diabetes mellitus with foot ulcer 04/14/2022 No Yes E11.42 Type 2 diabetes mellitus with diabetic polyneuropathy 04/14/2022 No Yes S91.301A Unspecified open wound, right foot, initial encounter 07/16/2022 No Yes 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 07/16/2022 No Yes severity Inactive  Problems Resolved Problems Electronic Signature(s) Signed: 09/10/2022 12:51:47 PM By: [name] DO Entered By: [name] on 09/10/2022 12:41:24 -------------------------------------------------------------------------------- Progress Note Details Patient Name: Date of Service: [name], [name] 09/10/2022 10:15 [name] Medical Record Number: [id number] Patient Account Number: [id number] Date of Birth/Sex: Treating RN: [birth year] (67 y.o. M) Primary Care Provider: [name] Other Clinician: Referring Provider: Treating Provider/Extender: [name] in Treatment: 21 Subjective Chief Complaint Information obtained from Patient SIMION, RIEDL ([id number]) [id number] Page 9 of 14 10/16/20; patient is here with [name] substantial wound on his right foot in the setting of [name] recent transmetatarsal amputation. 04/14/2022; referral from podiatry for postsurgical wound of nonhealing partial left fifth ray amputation, right foot (07/16/22): TMA dehiscence History of Present Illness (HPI) ADMISSION 10/16/20 This is [name] 67 year old man who is [name] type II diabetic. Initially seen by Dr. Unk Lightning of vein and vascular on 09/10/2021 for bilateral lower extremity rest pain right greater than left. His ABIs demonstrated monophasic waveforms at the ankles bilaterally with depressed toe pressures. His ABI in the right was 0.5 and on the left 0.28. There were no obtainable waveforms for TBI's. He underwent angiography on 09/10/2021 and had findings of severe PAD with 95% stenosis of the distal SFA. He also had peroneal and posterior tibial arteries occluded with significant collateralization in the calf there was reconstitution of the posterior tibial artery in the distal third of the calf. Ostia of the anterior tibial artery was not appreciated. He underwent [name] angioplasty of the superficial femoral artery. He required admission to hospital from 09/12/2021  through 09/22/2021 with right foot cellulitis and sepsis. On 3/13 he underwent [name] right below-knee popliteal to posterior tibial bypass using [name] greater saphenous vein. Ultimately however he required [name] right TMA by podiatry which I believe was done on 3/17 for progressive diabetic foot infection. He was discharged to [facility] skilled facility. He arrives in clinic today with [name] large wound area over the entirety of his amputation site extending proximally. The attempted flap closure of the amputation site and his TMA has failed and the wound extends under the attempt to closure. Still some sutures in place. There is an odor but he is not systemically unwell. He is due for follow-up arterial studies and has an appointment with vascular surgery on 10/28/2021. They are using wet-to-dry dressings at [facility]. Past medical history includes type 2 diabetes with peripheral neuropathy, hypertension, obstructive sleep  apnea 4/20; patient presents for follow-up. He is being discharged from his facility at [facility] today. They have been doing Dakin's wet-to-dry dressings. He states that his fiance can help with dressing changes. He is getting [facility] to come out 3 times [name] week once he leaves the facility. He is scheduled to see vein and vascular on 5/12. He has not seen the wound himself. He puts covers over his face for wound exams. He currently denies systemic signs of infection. 4/28; patient admitted to the clinic 2 weeks ago with [name] dehisced right TMA in the setting of type 2 diabetes with significant PAD. He is status post revascularization. He has been discharged from the nursing home he was at and now is at home using Dakin's wet-to-dry dressings daily he has home health. He denies any systemic signs of infection 5/4; patient presents for follow-up. His fiance and home health have been changing his dressings. He currently denies systemic signs of infection. He continues to put sheet covers  over his face during our wound encounters because he does not want [name] look at the wound. 5/25; Patient presents for follow-up. He missed his last clinic appointment. He had [name] bone biopsy and culture done at last clinic visit that showed acute osteomyelitis with growth of E. coli, stenotrophomonas maltophilia and enteroccocus faecalis. [name] referral to infectious disease was made. He has not heard from them yet. He has been using Dakin's wet-to-dry dressings. He is now more comfortable with looking at the wound bed. 6/6; the patient comes into clinic today with 2 wounds on the left foot which apparently have been there for several months. This was not known to assess but was known by Dr. Donzetta Matters. In fact he underwent an angiogram yesterday. He had [name] laser atherectomy of the left posterior tibial artery with [name] 3 mm balloon angioplasty, also [name] stent of the last left SFA. 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 This is [name] lot better than the last time I saw it. He has been using Dakin's wet-to-dry. He has an appointment with infectious disease for [name] bone biopsy which showed E. coli, stenotrophomonas and Enterococcus faecalis. The appointment is for tomorrow 04/14/2022 Mr. Behnam Shawhan ajuddin is an uncontrolled type 2 diabetic on insulin that presents with [name] left foot ulcer. On 12/18/2021 he required [name] left fifth ray amputation. He had revision of this site on 6/17 by Dr. Posey Pronto, podiatry. He states he has had [name] wound VAC on this area for the past 3 weeks. It is unclear what the prior wound care has been. He has [name] history of osteomyelitis to this area and was treated with IV and oral antibiotics For 6 weeks by Dr. Linus Salmons of infectious disease. At his last follow-up on 02/20/2022 his CRP was trending down and antibiotics were stopped. He also has [name] history of  peripheral arterial disease status post stenting to the left SFA and balloon angioplasty to the left posterior tibial artery On 12/2021. He has follow-up with vein and vascular at the end of the month. He also has [name] wound to the dorsal aspect of the left foot that he has been placing Betadine on. 10/19; patient presents for follow-up. He had [name] bone biopsy of the left foot at last clinic visit showed osteomyelitis. The bone culture showed  Enterobacter cloacae, Enterococcus faecalis, Staphylococcus aureus, viridans group strep, actinotigum schaelii. He has been referred to infectious disease And has an appointment on October 25. He has been using Dakin's wet-to-dry dressing to the lateral left foot wound and Hydrofera Blue and Medihoney to the dorsal foot wound. He has no issues or complaints today. He denies systemic signs of infection. 10/26; patient presents for follow-up. He saw Dr. Linus Salmons yesterday with infectious disease and has been prescribed doxycycline and Levaquin for his chronic osteomyelitis. He has been using Dakin's wet-to-dry dressings to the lateral left foot wound and Medihoney and Hydrofera Blue to the dorsal wound. We discussed [name] skin graft for the dorsal foot wound and he would like to proceed with insurance verification. 11/3; patient with [name] wound on the dorsal left foot and [name] substantial postsurgical area on the lateral left foot fifth ray amputation. He is also followed by Dr. Alton Revere of infectious disease. He had [name] bone biopsy from the left lateral foot that showed acute osteomyelitis. Swab culture showed multiple organisms. He has also been revascularized by infectious disease status post left SFA stenting and tibial laser atherectomy and angioplasty. An MRI of the left foot had previously shown osteomyelitis of the fifth met head we have been using Dakin's wet-to-dry to the left lateral foot and Medihoney and Hydrofera Blue to the wound on the dorsal foot. 11/13; HBO treatment was  discussed at last clinic visit and patient would like to proceed with this. He has orientation today. He has been approved for Grafix. We have been using Hydrofera Blue and Medihoney to the left dorsal foot and collagen to the left lateral foot. Patient has home health that comes in for dressing changes. He currently has no issues or complaints today. 12/7; patient missed his last clinic appointment. He was last seen 3 weeks ago. He is currently off of antibiotics per ID. He completed [name] prolonged treatment. He has been using collagen to the lateral foot wound. He has been using Medihoney and Hydrofera Blue to the dorsal foot wound. He has been doing well with hyperbaric oxygen therapy. He has no issues or complaints today. He denies signs of of infection. 12/14; patient presents for follow-up. We have been using Grafix to the wound beds. He has had trouble equalizing the pressure in his ears during HBO treatments. It was recommended he follow-up with ENT He does not have an appointment yet. He currently denies signs of infection. . 12/21; patient presents for follow-up. He was seen by ENT and had tubes placed. He is continued HBO without issues. We have been using Grafix to the wound beds. There is been improvement in wound healing. 12/21; patient presents for follow-up. He has no issues or complaints today. There is been improvement in wound healing. 1/5; patient presents for follow-up. At last clinic visit Grafix was placed in standard fashion. He has no issues or complaints today. He is tolerating hyperbaric oxygen therapy well. He denies signs of infection. 1/11; patient presents for follow-up. We have been placing Grafix to the wound beds on the left foot. The dorsal left foot wound has healed. The left lateral foot wound has shown improvement in healing. Unfortunately has had dehiscence of his previous surgical wound site on the right foot. He currently denies signs of infection. [name], [name] ([id number]) [id number] Page 10 of 14 1/19; patient presents for follow-up. Grafix was placed to the left foot wound at last clinic visit. He has been using Dakin's wet-to-dry dressings to  the right foot wound. He continues HBO therapy without issues. He has shown improvement in wound healing to the left foot. 1/26; patient presents for follow-up. We been placing Grafix to the left foot wound and patient has been using Dakin's wet-to-dry dressings to the right foot wound. There is been improvement in wound healing. He continues HBO therapy without issues. 2/2; patient presents for follow-up. We have been using Grafix to the left foot wound and Dakin's wet-to-dry dressings to the right foot wound. He has no issues or complaints today. 2/8; patient presents for follow-up. We have been using Grafix to the left foot wound and Dakin's wet-to-dry dressings to the right foot wound. He has no issues or complaints today. He continues HBO without issues. 2/15; patient presents for follow-up. We have been using Grafix to the wound beds. He continues HBO without issues. He has been offloading the wound beds with his surgical shoe. 2/22; patient presents for follow-up. We have been using Grafix to the wound beds. He has no issues or complaints today. He continues HBO therapy without issues. 2/29; patient presents for follow-up. We have been using Grafix to the wound beds. There is been improvement in wound healing. He has no issues or complaints today. He continues HBO therapy. 3/7; patient presents for follow-up. We have been using Grafix to the wound beds. Yesterday patient had an arteriogram to the right lower extremity with [name] drug-eluting stent placed in the superficial femoral artery. He has no issues to report on today. Patient History Information obtained from Patient, Chart. Family History Diabetes - Mother,Siblings, Heart Disease - Siblings, Hypertension -  Siblings, Stroke - Mother, No family history of Cancer, Hereditary Spherocytosis, Kidney Disease, Lung Disease, Seizures, Thyroid Problems, Tuberculosis. Social History Former smoker, Marital Status - Single, Alcohol Use - Never, Drug Use - No History, Caffeine Use - Daily. Medical History Eyes Denies history of Cataracts, Glaucoma, Optic Neuritis Respiratory Patient has history of Asthma, Sleep Apnea Denies history of Aspiration, Chronic Obstructive Pulmonary Disease (COPD), Pneumothorax, Tuberculosis Cardiovascular Patient has history of Hypertension, Peripheral Venous Disease Denies history of Angina, Arrhythmia, Congestive Heart Failure, Coronary Artery Disease, Deep Vein Thrombosis, Hypotension, Myocardial Infarction, Peripheral Arterial Disease, Phlebitis, Vasculitis Endocrine Patient has history of Type II Diabetes Denies history of Type I Diabetes Integumentary (Skin) Denies history of History of Burn Musculoskeletal Patient has history of Osteomyelitis Denies history of Gout, Rheumatoid Arthritis, Osteoarthritis Neurologic Patient has history of Neuropathy Denies history of Dementia, Quadriplegia, Paraplegia, Seizure Disorder Hospitalization/Surgery History - 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 Posey Pronto. - aortogram 12/08/2021 Dr. Virl Cagey [facility]. Medical [name] Surgical History Notes nd Psychiatric PTSD Objective Constitutional respirations regular, non-labored and within target range for patient.. Vitals Time Taken: 10:10 AM, Height: 72 in, Weight: 220 lbs, BMI: 29.8, Temperature: 98.1 F, Pulse: 88 bpm, Respiratory Rate: 17 breaths/min, Blood Pressure: 172/71 mmHg, Capillary Blood Glucose: 160 mg/dl. Cardiovascular 2+ dorsalis pedis/posterior tibialis pulses. Psychiatric pleasant and cooperative. General Notes: Left foot: T the lateral aspect there is an open wound with granulation tissue and nonviable tissue. No probing to bone . Right foot: Along  the DHAIRYA, BODART [name] ([id number]) [id number] Page 11 of 14 previous TMA site there is an open wound with nonviable tissue, granulation tissue and callus. Does not probe to bone. No signs of surrounding soft tissue infection. Integumentary (Hair, Skin) Wound #5 status is Open. Original cause of wound was Surgical Injury. The date acquired was: 12/20/2021. The wound has been in treatment 21  weeks. The wound is located on the Left,Lateral Foot. The wound measures 2.7cm length x 0.2cm width x 0.4cm depth; 0.424cm^2 area and 0.17cm^3 volume. There is Fat Layer (Subcutaneous Tissue) exposed. There is no tunneling or undermining noted. There is [name] medium amount of serosanguineous drainage noted. The wound margin is epibole. There is large (67-100%) red, pink granulation within the wound bed. There is [name] small (1-33%) amount of necrotic tissue within the wound bed including Adherent Slough. The periwound skin appearance had no abnormalities noted for color. The periwound skin appearance did not exhibit: Callus, Crepitus, Excoriation, Induration, Rash, Scarring, Dry/Scaly, Maceration. Periwound temperature was noted as No Abnormality. Wound #6 status is Open. Original cause of wound was Gradually Appeared. The date acquired was: 07/16/2022. The wound has been in treatment 8 weeks. The wound is located on the Right Amputation Site - Transmetatarsal. The wound measures 0.2cm length x 0.2cm width x 0.5cm depth; 0.031cm^2 area and 0.016cm^3 volume. There is Fat Layer (Subcutaneous Tissue) exposed. There is no tunneling noted, however, there is undermining starting at 12:00 and ending at 12:00 with [name] maximum distance of 0.3cm. There is [name] medium amount of serosanguineous drainage noted. The wound margin is distinct with the outline attached to the wound base. There is large (67-100%) red granulation within the wound bed. There is no necrotic tissue within the wound bed. The periwound  skin appearance exhibited: Callus, Dry/Scaly. The periwound skin appearance did not exhibit: Crepitus, Excoriation, Induration, Rash, Scarring, Maceration, Atrophie Blanche, Cyanosis, Ecchymosis, Hemosiderin Staining, Mottled, Pallor, Rubor, Erythema. Assessment Active Problems ICD-10 Non-pressure chronic ulcer of other part of left foot with fat layer exposed Other acute osteomyelitis, left ankle and foot Non-pressure chronic ulcer of other part of left foot with other specified severity Type 2 diabetes mellitus with foot ulcer Type 2 diabetes mellitus with diabetic polyneuropathy Unspecified open wound, right foot, initial encounter 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 severity Patient's wounds appear well-healing. I debrided nonviable tissue. Grafix was placed in standard fashion. Continue surgical shoes with aggressive offloading. Follow-up in 1 week. Continue HBO therapy. Procedures Wound #5 Pre-procedure diagnosis of Wound #5 is [name] Diabetic Wound/Ulcer of the Lower Extremity located on the Left,Lateral Foot .Severity of Tissue Pre Debridement is: Fat layer exposed. There was [name] Selective/Open Wound Non-Viable Tissue Debridement with [name] total area of 0.54 sq cm performed by [name], DO. With the following instrument(s): Curette to remove Viable and Non-Viable tissue/material. Material removed includes [place] after achieving pain control using Lidocaine. No specimens were taken. [name] time out was conducted at 11:05, prior to the start of the procedure. [name] Minimum amount of bleeding was controlled with Pressure. The procedure was tolerated well with [name] pain level of 0 throughout and [name] pain level of 0 following the procedure. Post Debridement Measurements: 2.7cm length x 0.2cm width x 0.4cm depth; 0.17cm^3 volume. Character of Wound/Ulcer Post Debridement is improved. Severity of Tissue Post Debridement is: Fat layer exposed. Post procedure Diagnosis  Wound #5: Same as Pre-Procedure Pre-procedure diagnosis of Wound #5 is [name] Diabetic Wound/Ulcer of the Lower Extremity located on the Left,Lateral Foot. [name] skin graft procedure using [name] bioengineered skin substitute/cellular or tissue based product was performed by [name], DO with the following instrument(s): Forceps and Scissors. Grafix prime was applied and secured with Steri-Strips. 1 sq cm of product was utilized and 0 sq cm was wasted. Post Application, adaptic was applied. [name] Time Out was conducted at 11:10, prior to  the start of the procedure. The procedure was tolerated well with [name] pain level of 0 throughout and [name] pain level of 0 following the procedure. Post procedure Diagnosis Wound #5: Same as Pre-Procedure . 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 There was [name] Excisional Skin/Subcutaneous Tissue Debridement with [name] total area of 0.25 sq cm performed by [name], DO. With the following instrument(s): Curette to remove Viable and Non-Viable tissue/material. Material removed includes Subcutaneous Tissue and Slough and after achieving pain control using Lidocaine. No specimens were taken. [name] time out was conducted at 11:05, prior to the start of the procedure. [name] Minimum amount of bleeding was controlled with Pressure. The procedure was tolerated well with [name] pain level of 0 throughout and [name] pain level of 0 following the procedure. Post Debridement Measurements: 0.2cm length x 0.2cm width x 0.5cm depth; 0.016cm^3 volume. Character of Wound/Ulcer Post Debridement is improved. Severity of Tissue Post Debridement is: Fat layer exposed. 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 [name] skin graft procedure using [name] bioengineered skin substitute/cellular or tissue based product was performed by [name], DO with the following instrument(s): Forceps and Scissors. Grafix prime was applied and secured with Steri-Strips. 1 sq cm of product was utilized and 1 sq cm was wasted due to wound size; applied to other wound. Post Application, adaptic was applied. [name] Time Out was conducted at 11:10, prior to the start of the procedure. The procedure was tolerated well with [name] pain level of 0 throughout and [name] pain level of 0 following the procedure. Post procedure Diagnosis Wound #6: Same as Pre-Procedure . Plan BREKIN, ASEL [name] ([id number]) [id number] Page 12 of 14 Follow-up Appointments: Return Appointment in 1 week. - Dr. Heber [place] Thursday 09/17/2022 ROOM 8 1015 AFTER HYPERBARIC Return Appointment in 2 weeks. - Dr. Heber [place] and Allayne Butcher Rm # 9 Thursday 09/27/22 @ 10:30 Other: - ****ONLY CHANGE OUTTER DRESSINGS ONLY.*** Anesthetic: (In clinic) Topical Lidocaine 5% applied to wound bed Cellular or Tissue Based Products: Cellular or Tissue Based Product Type: - Run IVR for Grafix for left dorsal foot- pending Grafix # 1 applied 06/11/22 Grafix # 2 applied 06/18/22 Grafix # 3 applied 06/25/22 GRafix # 4 applied 07/02/22 Grafix # 5 applied 07/10/22 Grafix # 6 applied 07/17/22 Grafix # 7 applied 07/24/22---Also, go ahead and run IVR for more Grafix for left lateral foot Grafix #8 applied 07/31/2022 Grafix # 9 applied 08/07/22] left foot Grafix #10 applied 08/13/2022 left and right foot wounds today split the grafix in half. Graftx #11 applied to both wounds. Patient right foot approved for total of 12 between dates 08/18/2022-11/04/2022. Grafix #12 applied to both wounds. grafix #4 right foot split grafix and use to left foot #13. total can have 24. Grafix # 5 applied to right foot split grafix and use to left foot # 14. Cellular or Tissue Based  Product applied to wound bed, secured with steri-strips, cover with Adaptic or Mepitel. (DO NOT REMOVE). Edema Control - Lymphedema / SCD / Other: Avoid standing for long periods of time. Off-Loading: Open toe surgical shoe to: - wear when standing or walking. Hyperbaric Oxygen Therapy: Evaluate for HBO Therapy - 07/24/22- Pt. to finish next 8 HBO treatments 07/24/22- DR. Heber [place]  wants pt. 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 Follow Hyperbaric Oxygen Glycemia Protocol Afrin (Oxymetazoline HCL) 0.05% nasal spray - 1 spray in both nostrils daily as needed prior to HBO treatment for difficulty clearing ears WOUND #5: - Foot Wound Laterality: Left, Lateral Cleanser: Wound Cleanser (Generic) 1 x Per Day/30 Days Discharge Instructions: Cleanse the wound with wound cleanser prior to applying [name] clean dressing using gauze sponges, not tissue or cotton balls. Peri-Wound Care: Skin Prep (Generic) 1 x Per Day/30 Days Discharge Instructions: Use skin prep as directed Prim Dressing: Grafix (Generic) 1 x Per Day/30 Days ary Discharge Instructions: applied by provider. Prim Dressing: adaptic and steri-strips 1 x Per Day/30 Days ary Discharge Instructions: to secure grafix to wound bed. leave in place. Secondary Dressing: ABD Pad, 8x10 (Generic) 1 x Per Day/30 Days Discharge Instructions: Apply over primary dressing as directed. Secured With: [employer], 4.5x3.1 (in/yd) (Generic) 1 x Per Day/30 Days Discharge Instructions: Secure with Kerlix as directed. Secured With: 36M Medipore H Soft Cloth Surgical T ape, 4 x 10 (in/yd) (Generic) 1 x Per Day/30  Days Discharge Instructions: Secure with tape as directed. WOUND #6: - Amputation Site - Transmetatarsal Wound Laterality: Right Cleanser: Wound Cleanser (Generic) 1 x Per Day/30 Days Discharge Instructions: Cleanse the wound with wound cleanser prior to applying [name] clean dressing using gauze sponges, not tissue or cotton balls. Peri-Wound Care: Skin Prep (Generic) 1 x Per Day/30 Days Discharge Instructions: Use skin prep as directed Prim Dressing: Grafix (Generic) 1 x Per Day/30 Days ary Discharge Instructions: applied by provider. Prim Dressing: adaptic and steri-strips 1 x Per Day/30 Days ary Discharge Instructions: to secure grafix to wound bed. leave in place. Prim Dressing: cutimed sorbact 1 x Per Day/30 Days ary Discharge Instructions: lightly pack into wound bed behind the grafix. Secondary Dressing: ABD Pad, 8x10 (Generic) 1 x Per Day/30 Days Discharge Instructions: Apply over primary dressing as directed. Secured With: [employer], 4.5x3.1 (in/yd) (Generic) 1 x Per Day/30 Days Discharge Instructions: Secure with Kerlix as directed. Secured With: 36M Medipore H Soft Cloth Surgical T ape, 4 x 10 (in/yd) (Generic) 1 x Per Day/30 Days Discharge Instructions: Secure with tape as directed. 1. In office sharp debridement 2. Grafix placed in standard fashion 3. Aggressive offloadingoosurgical shoe 4. Follow-up in 1 week 5. Continue HBO therapy Electronic Signature(s) Signed: 09/10/2022 12:51:47 PM By: [name] DO Entered By: [name] on 09/10/2022 12:46:52 -------------------------------------------------------------------------------- HxROS Details Patient Name: Date of Service: [name], [name] 09/10/2022 10:15 [name] Medical Record Number: [id number] Patient Account Number: [id number] Date of Birth/Sex: Treating RN: Nov 06, 1955 (67 y.o. M) Primary Care Provider: [name] Other Clinician: Referring Provider: Treating Provider/Extender: [name], [name] in Treatment: [address], [place] Barrier [name] ([id number]) [id number] Page 13 of 14 Information Obtained From Patient Chart Eyes Medical History: Negative for: Cataracts; Glaucoma; Optic Neuritis Respiratory Medical History: Positive for: Asthma; Sleep Apnea Negative for: Aspiration; Chronic Obstructive Pulmonary Disease (COPD); Pneumothorax; Tuberculosis Cardiovascular Medical History: Positive for: Hypertension; Peripheral Venous Disease Negative for: Angina; Arrhythmia; Congestive Heart Failure; Coronary Artery Disease; Deep Vein Thrombosis; Hypotension; Myocardial Infarction; Peripheral Arterial  Disease; Phlebitis; Vasculitis Endocrine Medical History: Positive for: Type II Diabetes Negative for: Type I Diabetes Time with diabetes: 20 years Treated with: Insulin, Oral agents Blood sugar tested every day: Yes Tested : 4x day Integumentary (Skin) Medical History: Negative for: History of Burn Musculoskeletal Medical History: Positive for: Osteomyelitis Negative for: Gout; Rheumatoid Arthritis; Osteoarthritis Neurologic Medical History: Positive for: Neuropathy Negative for: Dementia; Quadriplegia; Paraplegia; Seizure Disorder Psychiatric Medical History: Past Medical History Notes: PTSD Immunizations Pneumococcal Vaccine: Received Pneumococcal Vaccination: No Implantable Devices None Hospitalization / Surgery History Type of Hospitalization/Surgery 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 Posey Pronto aortogram 12/08/2021 Dr. Virl Cagey [facility] Family and Social History Cancer: No; Diabetes: Yes - Mother,Siblings; Heart Disease: Yes - Siblings; Hereditary Spherocytosis: No; Hypertension: Yes - Siblings; Kidney Disease: No; Lung Disease: No; Seizures: No; Stroke: Yes - Mother; Thyroid Problems: No; Tuberculosis: No; Former smoker; Marital Status - Single; Alcohol Use: Never; Drug Use: No History; Caffeine Use:  Daily; Financial Concerns: No; Food, Clothing or Shelter Needs: No; Support System Lacking: No; Transportation Concerns: No Electronic Signature(s) Signed: 09/10/2022 12:51:47 PM By: [name] DO Entered By: [name] on 09/10/2022 12:44:50 [name] ([id number]) [phone].pdf Page 14 of 14 -------------------------------------------------------------------------------- SuperBill Details Patient Name: Date of Service: [name], [name] [name] 09/10/2022 Medical Record Number: [id number] Patient Account Number: [id number] Date of Birth/Sex: Treating RN: April 13, 1956 (67 y.o. [name], [name] Primary Care Provider: [name] Other Clinician: Referring Provider: Treating Provider/Extender: [name] in Treatment: 21 Diagnosis Coding ICD-10 Codes Code Description [phone] Non-pressure chronic ulcer of other part of left foot with fat layer exposed M86.172 Other acute osteomyelitis, left ankle and foot L97.528 Non-pressure chronic ulcer of other part of left foot with other specified severity E11.621 Type 2 diabetes mellitus with foot ulcer E11.42 Type 2 diabetes mellitus with diabetic polyneuropathy S91.301A Unspecified open wound, right foot, initial encounter 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 severity Facility Procedures : CPT4 Code: [id number] Description: Q4133- Grafix PL 16 mm disc (2 units) Modifier: Quantity: 2 : CPT4 Code: [id number] Description: [id number] - SKIN SUB GRAFT FACE/NK/HF/G ICD-10 Diagnosis Description 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 sever L97.522 Non-pressure chronic ulcer of other part of left foot with fat layer exposed Modifier: ity Quantity: 1 : CPT4 Code: [id number] Description: [id number] - DEBRIDE WOUND 1ST 20 SQ CM OR < ICD-10 Diagnosis Description L97.522 Non-pressure chronic ulcer of other part  of left foot with fat layer exposed Modifier: Quantity: 1 Physician Procedures : CPT4 Code Description Modifier [id number] - WC PHYS SKIN SUB GRAFT FACE/NK/HF/G ICD-10 Diagnosis Description 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 severity L97.522 Non-pressure chronic ulcer of other  part of left foot with fat layer exposed Quantity: 1 : [id number] - WC PHYS DEBR WO ANESTH 20 SQ CM ICD-10 Diagnosis Description L97.522 Non-pressure chronic ulcer of other part of left foot with fat layer exposed Quantity: 1 Electronic Signature(s) Signed: 09/10/2022 12:51:47 PM By: [name] DO Entered By: [name] on 09/10/2022 12:47:41

## 2022-09-12 NOTE — Progress Notes (Signed)
KAYLER, KOSKY A ([id number]) [id number] Page 1 of 8 Visit Report for 08/07/2022 Arrival Information Details Patient Name: Date of Service: [name], [name] 08/07/2022 11:00 A M Medical Record Number: [id number] Patient Account Number: [id number] Date of Birth/Sex: Treating RN: 1955-10-15 (67 y.o. M) Primary Care Anderia Lorenzo: [name] Other Clinician: Referring Carrieann Spielberg: Treating Renatta Shrieves/Extender: [name] in Treatment: 16 Visit Information History Since Last Visit Added or deleted any medications: No Patient Arrived: Wheel Chair Any new allergies or adverse reactions: No Arrival Time: 08:41 Had a fall or experienced change in No Accompanied By: self activities of daily living that may affect Transfer Assistance: None risk of falls: Patient Identification Verified: Yes Signs or symptoms of abuse/neglect since last visito No Secondary Verification Process Completed: Yes Hospitalized since last visit: No Patient Requires Transmission-Based Precautions: No Implantable device outside of the clinic excluding No Patient Has Alerts: Yes cellular tissue based products placed in the center Patient Alerts: ESBL;MRSA since last visit: Has Dressing in Place as Prescribed: Yes Pain Present Now: No Electronic Signature(s) Signed: 08/07/2022 12:35:45 PM By: [name] Entered By: [name] on 08/07/2022 08:41:42 -------------------------------------------------------------------------------- Encounter Discharge Information Details Patient Name: Date of Service: [name], [name] 08/07/2022 11:00 A M Medical Record Number: [id number] Patient Account Number: [id number] Date of Birth/Sex: Treating RN: [birth year] (67 y.o. [name], [name] Primary Care Krystianna Soth: [name] Other Clinician: Referring Nakhi Choi: Treating Konnor Vondrasek/Extender: [name] in Treatment: 16 Encounter Discharge  Information Items Post Procedure Vitals Discharge Condition: Stable Temperature (F): 98.1 Ambulatory Status: Wheelchair Pulse (bpm): 74 Discharge Destination: Home Respiratory Rate (breaths/min): 17 Transportation: Private Auto Blood Pressure (mmHg): 134/74 Accompanied By: self Schedule Follow-up Appointment: Yes Clinical Summary of Care: Patient Declined Electronic Signature(s) Signed: 09/10/2022 4:33:55 PM By: Rhae Hammock RN Entered By: Rhae Hammock on 08/07/2022 09:18:51 -------------------------------------------------------------------------------- Lower Extremity Assessment Details Patient Name: Date of Service: [name], [name] 08/07/2022 11:00 A M Medical Record Number: [id number] Patient Account Number: [id number] Date of Birth/Sex: Treating RN: [birth year] (67 y.o. M) Primary Care Saysha Menta: [name] Other Clinician: Referring Gabriel Conry: Treating Katrese Shell/Extender: [name], [name] in Treatment: 16 Edema Assessment Assessed: [Left: No] [name]: No] T[LeftMITHIL, HIRABAYASHI A [id number] [Right: [id number] Page 2 of 8] Edema: [Left: Yes] [Right: No] Calf Left: Right: Point of Measurement: 36 cm From Medial Instep 40.5 cm 40.5 cm Ankle Left: Right: Point of Measurement: 9 cm From Medial Instep 22.5 cm 22.5 cm Electronic Signature(s) Signed: 08/07/2022 12:35:45 PM By: [name] Entered By: [name] on 08/07/2022 08:54:27 -------------------------------------------------------------------------------- Multi Wound Chart Details Patient Name: Date of Service: [name], [name] 08/07/2022 11:00 A M Medical Record Number: [id number] Patient Account Number: [id number] Date of Birth/Sex: Treating RN: Nov 25, 1955 (67 y.o. M) Primary Care Eliot Popper: [name] Other Clinician: Referring [name] Behrend: Treating Kent Riendeau/Extender: [name], [name] in Treatment: 16 Vital  Signs Height(in): 72 Capillary Blood Glucose(mg/dl): 85 Weight(lbs): 220 Pulse(bpm): 52 Body Mass Index(BMI): 29.8 Blood Pressure(mmHg): 142/84 Temperature(F): 97.7 Respiratory Rate(breaths/min): 16 [5:Photos:] [N/A:N/A] Left, Lateral Foot Right Amputation Site - N/A Wound Location: Transmetatarsal Surgical Injury Gradually Appeared N/A Wounding Event: Diabetic Wound/Ulcer of the Lower Diabetic Wound/Ulcer of the Lower N/A Primary Etiology: Extremity Extremity Open Surgical Wound N/A N/A Secondary Etiology: Asthma, Sleep Apnea, Hypertension, Asthma, Sleep Apnea, Hypertension, N/A Comorbid History: Peripheral Venous Disease, Type II Peripheral Venous Disease, Type II Diabetes, Osteomyelitis, Neuropathy Diabetes, Osteomyelitis, Neuropathy 12/20/2021 07/16/2022 N/A Date Acquired:  16 3 N/A Weeks of Treatment: Open Open N/A Wound Status: No No N/A Wound Recurrence: 4.5x0.5x0.3 0.3x0.5x0.5 N/A Measurements L x W x D (cm) 1.767 0.118 N/A A (cm) : rea 0.53 0.059 N/A Volume (cm) : 82.30% 88.00% N/A % Reduction in A rea: 89.40% 94.00% N/A % Reduction in Volume: Grade 3 Grade 2 N/A Classification: Medium Medium N/A Exudate A mount: Serosanguineous Serosanguineous N/A Exudate Type: red, brown red, brown N/A Exudate Color: Epibole N/A N/A Wound Margin: Large (67-100%) Large (67-100%) N/A Granulation A mount: Red, Pink Red N/A Granulation Quality: Small (1-33%) None Present (0%) N/A Necrotic A mount: Fat Layer (Subcutaneous Tissue): Yes Fat Layer (Subcutaneous Tissue): Yes N/A Exposed Structures: Fascia: No Fascia: No Tendon: No Tendon: No Muscle: No Muscle: No Joint: No Joint: No Bone: No Bone: No Large (67-100%) None N/A Epithelialization: PRABHNOOR, GLENNEY A ([id number]) [id number] Page 3 of 8 Debridement - Excisional Debridement - Excisional N/A Debridement: 09:15 09:15 N/A Pre-procedure Verification/Time  Out Taken: Lidocaine Lidocaine N/A Pain Control: Callus, Subcutaneous, Slough Callus, Subcutaneous, Slough N/A Tissue Debrided: Skin/Subcutaneous Tissue Skin/Subcutaneous Tissue N/A Level: 2.25 0.15 N/A Debridement A (sq cm): rea Curette Curette N/A Instrument: Minimum Minimum N/A Bleeding: Pressure Pressure N/A Hemostasis A chieved: 0 0 N/A Procedural Pain: 0 0 N/A Post Procedural Pain: Procedure was tolerated well Procedure was tolerated well N/A Debridement Treatment Response: 4.5x0.5x0.3 0.3x0.5x0.5 N/A Post Debridement Measurements L x W x D (cm) 0.53 0.059 N/A Post Debridement Volume: (cm) Callus: Yes Callus: Yes N/A Periwound Skin Texture: Excoriation: No Excoriation: No Induration: No Induration: No Crepitus: No Crepitus: No Rash: No Rash: No Scarring: No Scarring: No Maceration: Yes Maceration: No N/A Periwound Skin Moisture: Dry/Scaly: No Dry/Scaly: No Atrophie Blanche: No Atrophie Blanche: No N/A Periwound Skin Color: Cyanosis: No Cyanosis: No Ecchymosis: No Ecchymosis: No Erythema: No Erythema: No Hemosiderin Staining: No Hemosiderin Staining: No Mottled: No Mottled: No Pallor: No Pallor: No Rubor: No Rubor: No No Abnormality N/A N/A Temperature: Cellular or Tissue Based Product Debridement N/A Procedures Performed: Debridement Treatment Notes Wound #5 (Foot) Wound Laterality: Left, Lateral Cleanser Wound Cleanser Discharge Instruction: Cleanse the wound with wound cleanser prior to applying a clean dressing using gauze sponges, not tissue or cotton balls. Peri-Wound Care Skin Prep Discharge Instruction: Use skin prep as directed Topical Primary Dressing Grafix Secondary Dressing ABD Pad, 8x10 Discharge Instruction: Apply over primary dressing as directed. Secured With [employer], 4.5x3.1 (in/yd) Discharge Instruction: Secure with Kerlix as directed. 50M Medipore H Soft Cloth Surgical T ape, 4 x 10  (in/yd) Discharge Instruction: Secure with tape as directed. Compression Wrap Compression Stockings Add-Ons Wound #6 (Amputation Site - Transmetatarsal) Wound Laterality: Right Cleanser Wound Cleanser Discharge Instruction: Cleanse the wound with wound cleanser prior to applying a clean dressing using gauze sponges, not tissue or cotton balls. Peri-Wound Care Topical Primary Dressing Dakin's Solution 0.25%, 16 (oz) LAMARION, SPEDDING A ([id number]) [id number] Page 4 of 8 Discharge Instruction: Moisten gauze with Dakin's solution Secondary Dressing ABD Pad, 8x10 Discharge Instruction: Apply over primary dressing as directed. Woven Gauze Sponge, Non-Sterile 4x4 in Discharge Instruction: Apply over primary dressing as directed. Secured With [employer], 4.5x3.1 (in/yd) Discharge Instruction: Secure with Kerlix as directed. Compression Wrap Compression Stockings Add-Ons Electronic Signature(s) Signed: 08/07/2022 11:56:15 AM By: [name] DO Entered By: [name] on 08/07/2022 09:30:24 -------------------------------------------------------------------------------- Multi-Disciplinary Care Plan Details Patient Name: Date of Service: [name], [place] [name] 08/07/2022 11:00 A M Medical Record Number: [id number] Patient Account Number: [id number]  Date of Birth/Sex: Treating RN: Aug 02, 1955 (67 y.o. [name], [name] Primary Care Joziyah Roblero: [name] Other Clinician: Referring Graycie Halley: Treating Quanita Barona/Extender: [name], [name] in Treatment: 16 Active Inactive Osteomyelitis Nursing Diagnoses: Infection: osteomyelitis Goals: Diagnostic evaluation for osteomyelitis completed as ordered Date Initiated: 04/23/2022 Target Resolution Date: 09/04/2022 Goal Status: Active Signs and symptoms for osteomyelitis will be recognized and promptly addressed Date Initiated: 04/23/2022 Target Resolution Date: 09/04/2022 Goal  Status: Active Interventions: Assess for signs and symptoms of osteomyelitis resolution every visit Provide education on osteomyelitis Screen for HBO Treatment Activities: Biopsy : 04/16/2022 Consult for HBO : 04/23/2022 Surgical debridement : 04/23/2022 Systemic antibiotics : 04/23/2022 T ordered outside of clinic : 04/23/2022 est Notes: Wound/Skin Impairment Nursing Diagnoses: Knowledge deficit related to ulceration/compromised skin integrity Goals: Patient/caregiver will verbalize understanding of skin care regimen Date Initiated: 04/14/2022 Target Resolution Date: 09/04/2022 Goal Status: Active GERSON, ARMELIN A ([id number]) [id number] Page 5 of 8 Interventions: Assess patient/caregiver ability to perform ulcer/skin care regimen upon admission and as needed Assess ulceration(s) every visit Provide education on ulcer and skin care Notes: Electronic Signature(s) Signed: 09/10/2022 4:33:55 PM By: Rhae Hammock RN Entered By: Rhae Hammock on 08/07/2022 09:04:38 -------------------------------------------------------------------------------- Pain Assessment Details Patient Name: Date of Service: [name], [name] 08/07/2022 11:00 A M Medical Record Number: [id number] Patient Account Number: [id number] Date of Birth/Sex: Treating RN: 1955/10/04 (67 y.o. M) Primary Care Sheridan Hew: [name] Other Clinician: Referring Calla Wedekind: Treating Etrulia Zarr/Extender: [name], [name] in Treatment: 16 Active Problems Location of Pain Severity and Description of Pain Patient Has Paino No Site Locations Pain Management and Medication Current Pain Management: Electronic Signature(s) Signed: 08/07/2022 12:35:45 PM By: [name] Entered By: [name] on 08/07/2022 08:42:39 -------------------------------------------------------------------------------- Patient/Caregiver Education Details Patient Name: Date of Service: [name], Vinetta Bergamo 2/2/2024andnbsp11:00 A M Medical Record Number: [id number] Patient Account Number: [id number] Date of Birth/Gender: Treating RN: 1956-06-18 (67 y.o. [name], [name] Primary Care Physician: [name] Other Clinician: Referring Physician: Treating Physician/Extender: [name] in Treatment: 16 Education Assessment Education Provided To: Patient ASANTE, BURGY ([id number]) [id number] Page 6 of 8 Education Topics Provided Wound/Skin Impairment: Methods: Explain/Verbal Responses: Reinforcements needed, State content correctly Electronic Signature(s) Signed: 09/10/2022 4:33:55 PM By: Rhae Hammock RN Entered By: Rhae Hammock on 08/07/2022 09:04:50 -------------------------------------------------------------------------------- Wound Assessment Details Patient Name: Date of Service: [name], [name] 08/07/2022 11:00 A M Medical Record Number: [id number] Patient Account Number: [id number] Date of Birth/Sex: Treating RN: 09-01-1955 (67 y.o. M) Primary Care Carliyah Cotterman: [name] Other Clinician: Referring Shyna Duignan: Treating Bertram Haddix/Extender: [name], [name] in Treatment: 16 Wound Status Wound Number: 5 Primary Diabetic Wound/Ulcer of the Lower Extremity Etiology: Wound Location: Left, Lateral Foot Secondary Open Surgical Wound Wounding Event: Surgical Injury Etiology: Date Acquired: 12/20/2021 Wound Open Weeks Of Treatment: 16 Status: Clustered Wound: No Comorbid Asthma, Sleep Apnea, Hypertension, Peripheral Venous Disease, History: Type II Diabetes, Osteomyelitis, Neuropathy Photos Wound Measurements Length: (cm) 4.5 Width: (cm) 0.5 Depth: (cm) 0.3 Area: (cm) 1.767 Volume: (cm) 0.53 % Reduction in Area: 82.3% % Reduction in Volume: 89.4% Epithelialization: Large (67-100%) Tunneling: No Undermining: No Wound Description Classification: Grade 3 Wound  Margin: Epibole Exudate Amount: Medium Exudate Type: Serosanguineous Exudate Color: red, brown Foul Odor After Cleansing: No Slough/Fibrino Yes Wound Bed Granulation Amount: Large (67-100%) Exposed Structure Granulation Quality: Red, Pink Fascia Exposed: No Necrotic Amount: Small (1-33%) Fat Layer (Subcutaneous Tissue) Exposed: Yes Necrotic Quality: Adherent Slough Tendon  Exposed: No Muscle Exposed: No Joint Exposed: No Bone Exposed: No Periwound Skin Texture Texture Color No Abnormalities Noted: No No Abnormalities Noted: Yes Callus: Yes Temperature / Pain Vahey, Zaeem A ([id number]) [id number] Page 7 of 8 Crepitus: No Temperature: No Abnormality Excoriation: No Induration: No Rash: No Scarring: No Moisture No Abnormalities Noted: No Dry / Scaly: No Maceration: Yes Electronic Signature(s) Signed: 08/07/2022 12:35:45 PM By: [name] Entered By: [name] on 08/07/2022 08:58:24 -------------------------------------------------------------------------------- Wound Assessment Details Patient Name: Date of Service: [name], [name] 08/07/2022 11:00 A M Medical Record Number: [id number] Patient Account Number: [id number] Date of Birth/Sex: Treating RN: [birth year] (67 y.o. M) Primary Care Kali Deadwyler: [name] Other Clinician: Referring Quiara Killian: Treating Cresencia Asmus/Extender: [name], [name] in Treatment: 16 Wound Status Wound Number: 6 Primary Diabetic Wound/Ulcer of the Lower Extremity Etiology: Wound Location: Right Amputation Site - Transmetatarsal Wound Open Wounding Event: Gradually Appeared Status: Date Acquired: 07/16/2022 Comorbid Asthma, Sleep Apnea, Hypertension, Peripheral Venous Disease, Weeks Of Treatment: 3 History: Type II Diabetes, Osteomyelitis, Neuropathy Clustered Wound: No Photos Wound Measurements Length: (cm) 0.3 Width: (cm) 0.5 Depth: (cm) 0.5 Area: (cm) 0.118 Volume: (cm)  0.059 % Reduction in Area: 88% % Reduction in Volume: 94% Epithelialization: None Tunneling: No Undermining: No Wound Description Classification: Grade 2 Exudate Amount: Medium Exudate Type: Serosanguineous Exudate Color: red, brown Foul Odor After Cleansing: No Slough/Fibrino Yes Wound Bed Granulation Amount: Large (67-100%) Exposed Structure Granulation Quality: Red Fascia Exposed: No Necrotic Amount: None Present (0%) Fat Layer (Subcutaneous Tissue) Exposed: Yes Tendon Exposed: No Muscle Exposed: No Joint Exposed: No Bone Exposed: No Periwound Skin Texture Texture Color DENISE, GMEREK A ([id number]) [id number] Page 8 of 8 No Abnormalities Noted: No No Abnormalities Noted: No Callus: Yes Atrophie Blanche: No Crepitus: No Cyanosis: No Excoriation: No Ecchymosis: No Induration: No Erythema: No Rash: No Hemosiderin Staining: No Scarring: No Mottled: No Pallor: No Moisture Rubor: No No Abnormalities Noted: No Dry / Scaly: No Maceration: No Electronic Signature(s) Signed: 08/07/2022 12:35:45 PM By: [name] Entered By: [name] on 08/07/2022 08:59:16 -------------------------------------------------------------------------------- Vitals Details Patient Name: Date of Service: [name], [name] 08/07/2022 11:00 A M Medical Record Number: [id number] Patient Account Number: [id number] Date of Birth/Sex: Treating RN: 1956-03-30 (67 y.o. M) Primary Care Justine Dines: [name] Other Clinician: Referring Demian Maisel: Treating Matilyn Fehrman/Extender: [name], [name] in Treatment: 16 Vital Signs Time Taken: 08:41 Temperature (F): 97.7 Height (in): 72 Pulse (bpm): 71 Weight (lbs): 220 Respiratory Rate (breaths/min): 16 Body Mass Index (BMI): 29.8 Blood Pressure (mmHg): 142/84 Capillary Blood Glucose (mg/dl): 85 Reference Range: 80 - 120 mg / dl Electronic Signature(s) Signed: 08/07/2022 12:35:45 PM By:  [name] Entered By: [name] on 08/07/2022 08:42:34

## 2022-09-12 NOTE — Progress Notes (Signed)
HEMAN, SALAH [name] ([id number]) [id number] Page 1 of 2 Visit Report for 09/11/2022 Arrival Information Details Patient Name: Date of Service: [name], [name] 09/11/2022 8:00 [name] Medical Record Number: [id number] Patient Account Number: [id number] Date of Birth/Sex: Treating RN: 06/23/1956 (67 y.o. [name] Primary Care Zuriel Yeaman: [name] Other Clinician: [name] Referring Febe Champa: Treating Tex Conroy/Extender: [name] in Treatment: 21 Visit Information History Since Last Visit All ordered tests and consults were completed: Yes Patient Arrived: Kasandra Knudsen Added or deleted any medications: No Arrival Time: 07:30 Any new allergies or adverse reactions: No Accompanied By: self Had [name] fall or experienced change in No Transfer Assistance: None activities of daily living that may affect Patient Identification Verified: Yes risk of falls: Secondary Verification Process Completed: Yes Signs or symptoms of abuse/neglect since last visito No Patient Requires Transmission-Based Precautions: No Hospitalized since last visit: No Patient Has Alerts: Yes Implantable device outside of the clinic excluding No cellular tissue based products placed in the center since last visit: Has Dressing in Place as Prescribed: Yes Has Footwear/Offloading in Place as Prescribed: Yes Pain Present Now: No Electronic Signature(s) Signed: 09/11/2022 9:09:46 AM By: [name] CHT EMT BS , , Entered By: [name] on 09/11/2022 09:09:46 -------------------------------------------------------------------------------- Encounter Discharge Information Details Patient Name: Date of Service: [name], [name] 09/11/2022 8:00 [name] Medical Record Number: [id number] Patient Account Number: [id number] Date of Birth/Sex: Treating RN: 1956-06-19 (67 y.o. [name] Primary Care Kashus Karlen: [name] Other Clinician: [name] Referring Joab Carden: Treating Gursimran Litaker/Extender: [name] in Treatment: 21 Encounter Discharge Information Items Discharge Condition: Stable Ambulatory Status: Wheelchair Discharge Destination: Home Transportation: Private Auto Accompanied By: self Schedule Follow-up Appointment: No Clinical Summary of Care: Electronic Signature(s) Signed: 09/11/2022 3:13:25 PM By: [name] CHT EMT BS , , Entered By: [name] on 09/11/2022 15:13:25 -------------------------------------------------------------------------------- Vitals Details Patient Name: Date of Service: [name], [name] 09/11/2022 8:00 [name] Medical Record Number: [id number] Patient Account Number: [id number] Date of Birth/Sex: Treating RN: 03-16-1956 (67 y.o. [name] Primary Care Travas Schexnayder: [name] Other Clinician: [name] Referring Jammy Plotkin: Treating Keiarra Charon/Extender: [name] in Treatment: [address] [name], [name] ([id number]) [id number] Page 2 of 2 Time Taken: 07:32 Temperature (F): 98.8 Height (in): 72 Pulse (bpm): 98 Weight (lbs): 220 Respiratory Rate (breaths/min): 18 Body Mass Index (BMI): 29.8 Blood Pressure (mmHg): 153/82 Capillary Blood Glucose (mg/dl): 176 Reference Range: 80 - 120 mg / dl Electronic Signature(s) Signed: 09/11/2022 9:10:31 AM By: [name] CHT EMT BS , , Entered By: [name] on 09/11/2022 09:10:31

## 2022-09-14 ENCOUNTER — Encounter (HOSPITAL_BASED_OUTPATIENT_CLINIC_OR_DEPARTMENT_OTHER): Payer: Medicare HMO | Admitting: General Surgery

## 2022-09-14 MED FILL — Midazolam HCl Inj 5 MG/5ML (Base Equivalent): INTRAMUSCULAR | Qty: 2 | Status: AC

## 2022-09-15 ENCOUNTER — Encounter (HOSPITAL_BASED_OUTPATIENT_CLINIC_OR_DEPARTMENT_OTHER): Payer: Medicare HMO | Admitting: General Surgery

## 2022-09-16 ENCOUNTER — Encounter (HOSPITAL_BASED_OUTPATIENT_CLINIC_OR_DEPARTMENT_OTHER): Payer: Medicare HMO | Admitting: Internal Medicine

## 2022-09-16 DIAGNOSIS — L97528 Non-pressure chronic ulcer of other part of left foot with other specified severity: Secondary | ICD-10-CM

## 2022-09-16 DIAGNOSIS — E11621 Type 2 diabetes mellitus with foot ulcer: Secondary | ICD-10-CM

## 2022-09-16 DIAGNOSIS — M86172 Other acute osteomyelitis, left ankle and foot: Secondary | ICD-10-CM

## 2022-09-16 DIAGNOSIS — L97522 Non-pressure chronic ulcer of other part of left foot with fat layer exposed: Secondary | ICD-10-CM

## 2022-09-16 LAB — GLUCOSE, CAPILLARY
Glucose-Capillary: 212 mg/dL — ABNORMAL HIGH (ref 70–99)
Glucose-Capillary: 123 mg/dL — ABNORMAL HIGH (ref 70–99)

## 2022-09-16 NOTE — Progress Notes (Addendum)
GABRYLE, WALDE A ([id number]) [id number] Page 1 of 2 Visit Report for 09/16/2022 HBO Details Patient Name: Date of Service: [name], [name] 09/16/2022 8:00 A M Medical Record Number: [id number] Patient Account Number: [id number] Date of Birth/Sex: Treating RN: 04-21-1956 (67 y.o. [name] Primary Care Stanislaw Acton: [name] Other Clinician: Valeria Batman Referring Ashunti Schofield: Treating Ned Kakar/Extender: [name] in Treatment: 22 HBO Treatment Course Details Treatment Course Number: 1 Ordering Symphanie Cederberg: [name] T Treatments Ordered: otal 80 HBO Treatment Start Date: 06/01/2022 HBO Indication: Diabetic Ulcer(s) of the Lower Extremity Standard/Conservative Wound Care tried and failed greater than or equal to 30 days HBO Treatment Details Treatment Number: 64 Patient Type: Outpatient Chamber Type: Monoplace Chamber Serial #: [id number] Treatment Protocol: 2.5 ATA with 90 minutes oxygen, with two 5 minute air breaks Treatment Details Compression Rate Down: 2.0 psi / minute De-Compression Rate Up: A breaks and breathing ir Compress Tx Pressure periods Decompress Decompress Begins Reached (leave unused spaces Begins Ends blank) Chamber Pressure (ATA 1 2.5 2.5 2.5 2.5 2.5 - - 2.5 1 ) Clock Time (24 hr) 08:24 08:37 09:07 09:12 09:42 09:47 - - 10:17 10:27 Treatment Length: 123 (minutes) Treatment Segments: 4 Vital Signs Capillary Blood Glucose Reference Range: 80 - 120 mg / dl HBO Diabetic Blood Glucose Intervention Range: <131 mg/dl or >249 mg/dl Time Vitals Blood Respiratory Capillary Blood Glucose Pulse Action Type: Pulse: Temperature: Taken: Pressure: Rate: Glucose (mg/dl): Meter #: Oximetry (%) Taken: Pre 07:53 130/78 97 20 97.2 212 Post 10:34 146/79 78 18 97.2 123 Treatment Response Treatment Toleration: Well Treatment Completion Status: Treatment Completed without Adverse Event Additional  Procedure Documentation Tissue Sevierity: Necrosis of bone Physician HBO Attestation: I certify that I supervised this HBO treatment in accordance with Medicare guidelines. A trained emergency response team is readily available per Yes hospital policies and procedures. Continue HBOT as ordered. Yes Electronic Signature(s) Signed: 09/17/2022 3:34:03 PM By: [name] DO Previous Signature: 09/16/2022 11:05:25 AM Version By: Valeria Batman EMT Previous Signature: 09/16/2022 9:54:07 AM Version By: Valeria Batman EMT Entered By: [name] on 09/17/2022 12:49:18 -------------------------------------------------------------------------------- HBO Safety Checklist Details Patient Name: Date of Service: [name], [name] 09/16/2022 8:00 A M Medical Record Number: [id number] Patient Account Number: [id number] Date of Birth/Sex: Treating RN: [birth year] (67 y.o. [name], Docia Barrier A ([id number]) [id number] Page 2 of 2 Primary Care Djibril Glogowski: [name] Other Clinician: Valeria Batman Referring Spencer Peterkin: Treating Hikeem Andersson/Extender: [name] in Treatment: 22 HBO Safety Checklist Items Safety Checklist Consent Form Signed Patient voided / foley secured and emptied When did you last eato 0655 Last dose of injectable or oral agent 0620 Ostomy pouch emptied and vented if applicable NA All implantable devices assessed, documented and approved NA Intravenous access site secured and place NA Valuables secured Linens and cotton and cotton/polyester blend (less than 51% polyester) Personal oil-based products / skin lotions / body lotions removed Wigs or hairpieces removed NA Smoking or tobacco materials removed Books / newspapers / magazines / loose paper removed Cologne, aftershave, perfume and deodorant removed Jewelry removed (may wrap wedding band) Make-up removed NA Hair care products removed Battery  operated devices (external) removed Heating patches and chemical warmers removed Titanium eyewear removed NA Nail polish cured greater than 10 hours NA Casting material cured greater than 10 hours NA Hearing aids removed NA Loose dentures or partials removed removed by patient Prosthetics have been removed NA Patient demonstrates correct use of air  break device (if applicable) Patient concerns have been addressed Patient grounding bracelet on and cord attached to chamber Specifics for Inpatients (complete in addition to above) Medication sheet sent with patient NA Intravenous medications needed or due during therapy sent with patient NA Drainage tubes (e.g. nasogastric tube or chest tube secured and vented) NA Endotracheal or Tracheotomy tube secured NA Cuff deflated of air and inflated with saline NA Airway suctioned NA Notes The safety checklist was done before the treatment was started. Electronic Signature(s) Signed: 09/16/2022 9:43:12 AM By: Valeria Batman EMT Entered By: Valeria Batman on 09/16/2022 09:43:11

## 2022-09-16 NOTE — Progress Notes (Signed)
KEMPER, HUMM A ([id number]) [id number] Page 1 of 1 Visit Report for 09/11/2022 SuperBill Details Patient Name: Date of Service: [name], [name] 09/11/2022 Medical Record Number: [id number] Patient Account Number: [id number] Date of Birth/Sex: Treating RN: [birth year] (67 y.o. [name] Primary Care Provider: [name] Other Clinician: [name] Referring Provider: Treating Provider/Extender: [name] in Treatment: 21 Diagnosis Coding ICD-10 Codes Code Description [phone] Non-pressure chronic ulcer of other part of left foot with fat layer exposed M86.172 Other acute osteomyelitis, left ankle and foot L97.528 Non-pressure chronic ulcer of other part of left foot with other specified severity E11.621 Type 2 diabetes mellitus with foot ulcer E11.42 Type 2 diabetes mellitus with diabetic polyneuropathy S91.301A Unspecified open wound, right foot, initial encounter 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 severity Facility Procedures CPT4 Code Description Modifier Quantity [id number]-(Facility Use Only) HBOT full body chamber, 69mn , 4 ICD-10 Diagnosis Description E11.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 M86.172 Other acute osteomyelitis, left ankle and foot Physician Procedures Quantity CPT4 Code Description Modifier [id number] - WC PHYS HYPERBARIC OXYGEN THERAPY 1 ICD-10 Diagnosis Description E11.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

## 2022-09-16 NOTE — Progress Notes (Signed)
GIVON, DONAHOO A ([id number]) [id number] Page 1 of 2 Visit Report for 09/16/2022 Arrival Information Details Patient Name: Date of Service: [name], [name] 09/16/2022 8:00 A M Medical Record Number: [id number] Patient Account Number: [id number] Date of Birth/Sex: Treating RN: 24-Jul-1955 (67 y.o. [name] Primary Care Lalanya Rufener: [name] Other Clinician: Valeria Batman Referring Abenezer Odonell: Treating Neeraj Housand/Extender: [name] in Treatment: 27 Visit Information History Since Last Visit All ordered tests and consults were completed: Yes Patient Arrived: Wheel Chair Added or deleted any medications: No Arrival Time: 07:28 Any new allergies or adverse reactions: No Accompanied By: None Had a fall or experienced change in No Transfer Assistance: None activities of daily living that may affect Patient Identification Verified: Yes risk of falls: Secondary Verification Process Completed: Yes Signs or symptoms of abuse/neglect since last visito No Patient Requires Transmission-Based Precautions: No Hospitalized since last visit: No Patient Has Alerts: Yes Implantable device outside of the clinic excluding No cellular tissue based products placed in the center since last visit: Pain Present Now: No Electronic Signature(s) Signed: 09/16/2022 9:37:35 AM By: Valeria Batman EMT Entered By: Valeria Batman on 09/16/2022 09:37:35 -------------------------------------------------------------------------------- Encounter Discharge Information Details Patient Name: Date of Service: [name], [name] 09/16/2022 8:00 A M Medical Record Number: [id number] Patient Account Number: [id number] Date of Birth/Sex: Treating RN: Jul 16, 1955 (67 y.o. [name] Primary Care Tariq Pernell: [name] Other Clinician: Valeria Batman Referring Nance Mccombs: Treating Rayden Dock/Extender: [name] in  Treatment: 22 Encounter Discharge Information Items Discharge Condition: Stable Ambulatory Status: Wheelchair Discharge Destination: Home Transportation: Private Auto Accompanied By: None Schedule Follow-up Appointment: Yes Clinical Summary of Care: Electronic Signature(s) Signed: 09/16/2022 11:06:52 AM By: Valeria Batman EMT Entered By: Valeria Batman on 09/16/2022 11:06:52 -------------------------------------------------------------------------------- Vitals Details Patient Name: Date of Service: [name], [name] 09/16/2022 8:00 A M Medical Record Number: [id number] Patient Account Number: [id number] Date of Birth/Sex: Treating RN: Dec 01, 1955 (67 y.o. [name] Primary Care Darnell Jeschke: [name] Other Clinician: Valeria Batman Referring Mohsen Odenthal: Treating Caleesi Kohl/Extender: [name], [name] in Treatment: 22 Vital Signs Time Taken: 07:53 Temperature (F): 97.2 BARNES, SCHONBERGER A ([id number]) [id number] Page 2 of 2 Height (in): 72 Pulse (bpm): 97 Weight (lbs): 220 Respiratory Rate (breaths/min): 20 Body Mass Index (BMI): 29.8 Blood Pressure (mmHg): 130/78 Capillary Blood Glucose (mg/dl): 212 Reference Range: 80 - 120 mg / dl Electronic Signature(s) Signed: 09/16/2022 9:41:49 AM By: Valeria Batman EMT Entered By: Valeria Batman on 09/16/2022 09:41:49

## 2022-09-17 ENCOUNTER — Encounter (HOSPITAL_BASED_OUTPATIENT_CLINIC_OR_DEPARTMENT_OTHER): Payer: Medicare HMO | Admitting: Internal Medicine

## 2022-09-17 DIAGNOSIS — E11621 Type 2 diabetes mellitus with foot ulcer: Secondary | ICD-10-CM | POA: Diagnosis not present

## 2022-09-17 DIAGNOSIS — L97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 severity: Secondary | ICD-10-CM | POA: Diagnosis not present

## 2022-09-17 DIAGNOSIS — M86172 Other acute osteomyelitis, left ankle and foot: Secondary | ICD-10-CM | POA: Diagnosis not present

## 2022-09-17 DIAGNOSIS — L97522 Non-pressure chronic ulcer of other part of left foot with fat layer exposed: Secondary | ICD-10-CM

## 2022-09-17 DIAGNOSIS — L97528 Non-pressure chronic ulcer of other part of left foot with other specified severity: Secondary | ICD-10-CM

## 2022-09-17 LAB — GLUCOSE, CAPILLARY
Glucose-Capillary: 190 mg/dL — ABNORMAL HIGH (ref 70–99)
Glucose-Capillary: 116 mg/dL — ABNORMAL HIGH (ref 70–99)

## 2022-09-17 NOTE — Progress Notes (Addendum)
QUINTAVIS, TRANCHINA [name] ([id number]) [id number] Page 1 of 2 Visit Report for 09/17/2022 HBO Details Patient Name: Date of Service: [name], [name] 09/17/2022 8:00 [name] Medical Record Number: [id number] Patient Account Number: [id number] Date of Birth/Sex: Treating RN: 08/18/1955 (67 y.o. [name] Primary Care [name]: [name] Other Clinician: [name] Referring [name]: Treating [name] in Treatment: 22 HBO Treatment Course Details Treatment Course Number: 1 Ordering [name]: [name] T Treatments Ordered: otal 80 HBO Treatment Start Date: 06/01/2022 HBO Indication: Diabetic Ulcer(s) of the Lower Extremity Standard/Conservative Wound Care tried and failed greater than or equal to 30 days HBO Treatment Details Treatment Number: 65 Patient Type: Outpatient Chamber Type: Monoplace Chamber Serial #: [id number] Treatment Protocol: 2.0 ATA with 90 minutes oxygen, and no air breaks Treatment Details Compression Rate Down: 1.0 psi / minute De-Compression Rate Up: 1.0 psi / minute Air breaks and breathing Decompress Decompress Compress Tx Pressure Begins Reached periods Begins Ends (leave unused spaces blank) Chamber Pressure (ATA 1 2 ------2 1 ) Clock Time (24 hr) 08:19 08:53 - - - - - - 10:24 10:49 Treatment Length: 150 (minutes) Treatment Segments: 5 Vital Signs Capillary Blood Glucose Reference Range: 80 - 120 mg / dl HBO Diabetic Blood Glucose Intervention Range: <131 mg/dl or >249 mg/dl Type: Time Vitals Blood Respiratory Capillary Blood Glucose Pulse Action Pulse: Temperature: Taken: Pressure: Rate: Glucose (mg/dl): Meter #: Oximetry (%) Taken: Pre 07:41 163/80 90 18 97.2 190 1 none per protocol Post 10:53 157/96 78 18 97 116 1 none per protocol Treatment Response Treatment Toleration: Well Treatment Completion Status: Treatment Completed without Adverse  Event Treatment Notes Patient arrived, vital signs were taken and were within acceptable ranges. Patient prepared for treatment. After performing safety check patient was placed in the chamber which was compressed at [name] rate of 1 psi/min. Upon reaching 4 psig patient stated that he felt sinus pressure. He stated that it wasn't painful, just pressure. I reversed the pressure and even turned the chamber off to return to ambient pressure to relieve the symptoms. Patient opted to try again at [name] slower rate. This was accomplished by increasing the flow rate from its lowest setting to its highest setting and increasing pressure in increments. Chamber was pressurized at approximately 0.8 psi/min until patient experienced the same sinus pressure at approximately 5 psig. After allowing sinuses to equalize by patient choice, compression continued with incremental increases with holding pressure for [name] moment. This seemed to help some however patient experienced the same sinus pressure around 10 psig. I informed Dr. Heber Lak[place] [name] of the issue and the treatment was going to be cancelled. [name] opted to continue instead of canceling since he had made it to 10 psig, Dr. Heber  was informed and treatment proceeded. However, treatment today was at 2 ATA instead of the original order. Pressure was increased in increments of 1 psi and holding for [name] short time until he felt his sinuses had equalized. Patient tolerated treatment at 2.0 ATA. During the decompression phase of the treatment, [name] experienced sinus pressure again and the chamber was decompressed at 1 psi/min in increments of 1 psi with stops that lasted [name] two to three minutes. Patient denied any lasting pain or pressure. Patient denied any issues with ear equalization. Post-treatment vital signs were within range and patient was stable upon discharge to his wound care encounter scheduled after treatment. Additional Procedure Documentation Tissue  Sevierity: Necrosis of bone Physician  HBO Attestation: I certify that I supervised this HBO treatment in accordance with Medicare guidelines. [name] trained emergency response team is readily available per Yes hospital policies and procedures. Continue HBOT as ordered. [name], [name] ([id number]) [id number] Page 2 of 2 Electronic Signature(s) Signed: 09/18/2022 1:36:39 PM By: [name] Shan DO Previous Signature: 09/17/2022 3:11:20 PM Version By: [name] , , Previous Signature: 09/17/2022 3:34:03 PM Version By: [name] Shan DO Entered By: [name] on 09/18/2022 13:30:35 -------------------------------------------------------------------------------- HBO Safety Checklist Details Patient Name: Date of Service: [name], [name] 09/17/2022 8:00 [name] Medical Record Number: [id number] Patient Account Number: [id number] Date of Birth/Sex: Treating RN: 17-Oct-1955 (67 y.o. [name]: [name] Other Clinician: [name] Referring [name]: Treating [name]/Extender: [name] in Treatment: 22 HBO Safety Checklist Items Safety Checklist Consent Form Signed Patient voided / foley secured and emptied When did you last eato 0715 Last dose of injectable or oral agent 0630 Ostomy pouch emptied and vented if applicable NA All implantable devices assessed, documented and approved NA Intravenous access site secured and place NA Valuables secured Linens and cotton and cotton/polyester blend (less than 51% polyester) Personal oil-based products / skin lotions / body lotions removed Wigs or hairpieces removed NA Smoking or tobacco materials removed NA Books / newspapers / magazines / loose paper removed Cologne, aftershave, perfume and deodorant removed Jewelry removed (may wrap wedding band) Make-up removed Hair care products removed Battery operated devices  (external) removed Heating patches and chemical warmers removed Titanium eyewear removed Nail polish cured greater than 10 hours NA Casting material cured greater than 10 hours NA Hearing aids removed NA Loose dentures or partials removed dentures removed Prosthetics have been removed NA Patient demonstrates correct use of air break device (if applicable) Patient concerns have been addressed Patient grounding bracelet on and cord attached to chamber Specifics for Inpatients (complete in addition to above) Medication sheet sent with patient NA Intravenous medications needed or due during therapy sent with patient NA Drainage tubes (e.g. nasogastric tube or chest tube secured and vented) NA Endotracheal or Tracheotomy tube secured NA Cuff deflated of air and inflated with saline NA Airway suctioned NA Notes Paper version used prior to treatment start. Electronic Signature(s) Signed: 09/17/2022 2:46:44 PM By: [name] , , Entered By: [name] on 09/17/2022 14:46:44

## 2022-09-18 ENCOUNTER — Encounter (HOSPITAL_BASED_OUTPATIENT_CLINIC_OR_DEPARTMENT_OTHER): Payer: Medicare HMO | Attending: Internal Medicine | Admitting: Internal Medicine

## 2022-09-18 DIAGNOSIS — L97522 Non-pressure chronic ulcer of other part of left foot with fat layer exposed: Secondary | ICD-10-CM | POA: Diagnosis not present

## 2022-09-18 DIAGNOSIS — L97528 Non-pressure chronic ulcer of other part of left foot with other specified severity: Secondary | ICD-10-CM | POA: Diagnosis not present

## 2022-09-18 DIAGNOSIS — E1142 Type 2 diabetes mellitus with diabetic polyneuropathy: Secondary | ICD-10-CM | POA: Insufficient documentation

## 2022-09-18 DIAGNOSIS — L97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 severity: Secondary | ICD-10-CM | POA: Diagnosis not present

## 2022-09-18 DIAGNOSIS — M86172 Other acute osteomyelitis, left ankle and foot: Secondary | ICD-10-CM | POA: Diagnosis not present

## 2022-09-18 DIAGNOSIS — E11621 Type 2 diabetes mellitus with foot ulcer: Secondary | ICD-10-CM

## 2022-09-18 LAB — GLUCOSE, CAPILLARY
Glucose-Capillary: 226 mg/dL — ABNORMAL HIGH (ref 70–99)
Glucose-Capillary: 178 mg/dL — ABNORMAL HIGH (ref 70–99)

## 2022-09-18 NOTE — Progress Notes (Signed)
YESHAYAHU, MYRON A ([id number]) [id number] Page 1 of 15 Visit Report for 09/17/2022 Chief Complaint Document Details Patient Name: Date of Service: [name], [name] 09/17/2022 10:15 A M Medical Record Number: [id number] Patient Account Number: [id number] Date of Birth/Sex: Treating RN: 12-02-1955 (67 y.o. M) Primary Care Provider: [name] Other Clinician: Referring Provider: Treating Provider/Extender: [name], [name] in Treatment: 22 Information Obtained from: Patient Chief Complaint 10/16/20; patient is here with a substantial wound on his right foot in the setting of a recent transmetatarsal amputation. 04/14/2022; referral from podiatry for postsurgical wound of nonhealing partial left fifth ray amputation, right foot (07/16/22): TMA dehiscence Electronic Signature(s) Signed: 09/17/2022 3:34:03 PM By: [name] DO Entered By: [name] on 09/17/2022 11:56:15 -------------------------------------------------------------------------------- Cellular or Tissue Based Product Details Patient Name: Date of Service: [name], [name] 09/17/2022 10:15 A M Medical Record Number: [id number] Patient Account Number: [id number] Date of Birth/Sex: Treating RN: [birth year] (67 y.o. [name] Primary Care Provider: [name] Other Clinician: Referring Provider: Treating Provider/Extender: [name] in Treatment: 22 Cellular or Tissue Based Product Type Wound #5 Left,Lateral Foot Applied to: Performed By: Physician [name], DO Cellular or Tissue Based Product Type: Grafix prime Level of Consciousness (Pre-procedure): Awake and Alert Pre-procedure Verification/Time Out Yes - 11:30 Taken: Location: genitalia / hands / feet / multiple digits Wound Size (sq cm): 0.5 Product Size (sq cm): 1 Waste Size (sq cm): 0 Amount of Product Applied (sq cm): 1 Instrument Used: Forceps,  Scissors Lot #: [id number] Order #: 15 Expiration Date: 10/26/2023 Fenestrated: No Reconstituted: Yes Solution Type: saline Solution Amount: 2 ml Lot #: [id number] Solution Expiration Date: 12/03/2024 Secured: Yes Secured With: Steri-Strips Dressing Applied: Yes Primary Dressing: sorbact,, adatptic Procedural Pain: 0 Post Procedural Pain: 0 Response to Treatment: Procedure was tolerated well Level of Consciousness (Post- Awake and Alert procedure): Post Procedure Diagnosis Same as Pre-procedure Electronic Signature(s) Signed: 09/17/2022 3:34:03 PM By: Elenor Legato ([id number] Page 2 of 15 Signed: 09/17/2022 4:07:02 PM By: Baruch Gouty RN, [name] Entered By: Baruch Gouty on 09/17/2022 11:34:37 -------------------------------------------------------------------------------- Cellular or Tissue Based Product Details Patient Name: Date of Service: [name], [name] 09/17/2022 10:15 A M Medical Record Number: [id number] Patient Account Number: [id number] Date of Birth/Sex: Treating RN: 03/04/1956 (67 y.o. [name] Primary Care Provider: [name] Other Clinician: Referring Provider: Treating Provider/Extender: [name] in Treatment: 22 Cellular or Tissue Based Product Type Wound #6 Right Amputation Site - Transmetatarsal Applied to: Performed By: Physician [name], DO Cellular or Tissue Based Product Type: Grafix prime Level of Consciousness (Pre-procedure): Awake and Alert Pre-procedure Verification/Time Out Yes - 11:30 Taken: Location: genitalia / hands / feet / multiple digits Wound Size (sq cm): 0.12 Product Size (sq cm): 1 Waste Size (sq cm): 0 Amount of Product Applied (sq cm): 1 Instrument Used: Forceps, Scissors Lot #: [id number] Order #: 5 Expiration Date: 10/26/2023 Fenestrated: No Reconstituted: Yes Solution Type: saline Solution Amount: 2  ml Lot #: [id number] Solution Expiration Date: 12/03/2024 Secured: Yes Secured With: Steri-Strips Dressing Applied: Yes Primary Dressing: sorbact,, adatptic Procedural Pain: 0 Post Procedural Pain: 0 Response to Treatment: Procedure was tolerated well Level of Consciousness (Post- Awake and Alert procedure): Post Procedure Diagnosis Same as Pre-procedure Electronic Signature(s) Signed: 09/17/2022 3:34:03 PM By: [name] DO Signed: 09/17/2022 4:07:02 PM By: Baruch Gouty RN, [name] Entered By: Baruch Gouty on 09/17/2022 11:35:57 --------------------------------------------------------------------------------  Debridement Details Patient Name: Date of Service: [name], [name] 09/17/2022 10:15 A M Medical Record Number: [id number] Patient Account Number: [id number] Date of Birth/Sex: Treating RN: [birth year] (67 y.o. [name] Primary Care Provider: [name] Other Clinician: Referring Provider: Treating Provider/Extender: [name] in Treatment: 22 Debridement Performed for Assessment: Wound #5 Left,Lateral Foot Performed By: Physician [name], DO Debridement Type: Debridement Severity of Tissue Pre Debridement: Fat layer exposed Level of Consciousness (Pre-procedure): Awake and Alert Pre-procedure Verification/Time Out Yes - 11:25 Taken: Start Time: 11:27 Pain Control: Lidocaine 4% T opical Solution T Area Debrided (L x W): otal 2.5 (cm) x 0.5 (cm) = 1.25 (cm) LUKUS, NEUHOFF A ([id number] Page 3 of 15 Tissue and other material debrided: Non-Viable, Slough, Slough Level: Non-Viable Tissue Debridement Description: Selective/Open Wound Instrument: Curette Bleeding: Minimum Hemostasis Achieved: Pressure Procedural Pain: 0 Post Procedural Pain: 0 Response to Treatment: Procedure was tolerated well Level of Consciousness (Post- Awake and Alert procedure): Post Debridement  Measurements of Total Wound Length: (cm) 2.5 Width: (cm) 0.2 Depth: (cm) 0.3 Volume: (cm) 0.118 Character of Wound/Ulcer Post Debridement: Improved Severity of Tissue Post Debridement: Fat layer exposed Post Procedure Diagnosis Same as Pre-procedure Notes scribed by Baruch Gouty, RN for Dr. Heber Churdan Electronic Signature(s) Signed: 09/17/2022 3:34:03 PM By: [name] DO Signed: 09/17/2022 4:07:02 PM By: Baruch Gouty RN, [name] Entered By: Baruch Gouty on 09/17/2022 11:28:40 -------------------------------------------------------------------------------- Debridement Details Patient Name: Date of Service: [name], [name] 09/17/2022 10:15 A M Medical Record Number: [id number] Patient Account Number: [id number] Date of Birth/Sex: Treating RN: 11-08-1955 (67 y.o. [name] Primary Care Provider: [name] Other Clinician: Referring Provider: Treating Provider/Extender: [name] in Treatment: 22 Debridement Performed for Assessment: Wound #6 Right Amputation Site - Transmetatarsal Performed By: Physician [name], DO Debridement Type: Debridement Severity of Tissue Pre Debridement: Fat layer exposed Level of Consciousness (Pre-procedure): Awake and Alert Pre-procedure Verification/Time Out Yes - 11:25 Taken: Start Time: 11:27 Pain Control: Lidocaine 4% Topical Solution T Area Debrided (L x W): otal 2 (cm) x 1 (cm) = 2 (cm) Tissue and other material debrided: Non-Viable, Callus, Skin: Epidermis Level: Skin/Epidermis Debridement Description: Selective/Open Wound Instrument: Curette Bleeding: Minimum Hemostasis Achieved: Pressure Procedural Pain: 0 Post Procedural Pain: 0 Response to Treatment: Procedure was tolerated well Level of Consciousness (Post- Awake and Alert procedure): Post Debridement Measurements of Total Wound Length: (cm) 0.8 Width: (cm) 0.5 Depth: (cm) 0.5 Volume: (cm) 0.157 Character of  Wound/Ulcer Post Debridement: Improved Severity of Tissue Post Debridement: Fat layer exposed Post Procedure Diagnosis Same as Pre-procedure ELIER, SEBASTIANI A ([id number] Page 4 of 15 Notes scribed for Dr. Heber [place] by Baruch Gouty, RN Electronic Signature(s) Signed: 09/17/2022 3:34:03 PM By: [name] DO Signed: 09/17/2022 4:07:02 PM By: Baruch Gouty RN, [name] Entered By: Baruch Gouty on 09/17/2022 11:35:19 -------------------------------------------------------------------------------- HPI Details Patient Name: Date of Service: [name], [name] 09/17/2022 10:15 A M Medical Record Number: [id number] Patient Account Number: [id number] Date of Birth/Sex: Treating RN: 10/14/1955 (67 y.o. M) Primary Care Provider: [name] Other Clinician: Referring Provider: Treating Provider/Extender: [name], [name] in Treatment: 22 History of Present Illness HPI Description: ADMISSION 10/16/20 This is a 67 year old man who is a type II diabetic. Initially seen by Dr. Unk Lightning of vein and vascular on 09/10/2021 for bilateral lower extremity rest pain right greater than left. His ABIs demonstrated monophasic waveforms at the ankles bilaterally with depressed  toe pressures. His ABI in the right was 0.5 and on the left 0.28. There were no obtainable waveforms for TBI's. He underwent angiography on 09/10/2021 and had findings of severe PAD with 95% stenosis of the distal SFA. He also had peroneal and posterior tibial arteries occluded with significant collateralization in the calf there was reconstitution of the posterior tibial artery in the distal third of the calf. [place] of the anterior tibial artery was not appreciated. He underwent a angioplasty of the superficial femoral artery. He required admission to hospital from 09/12/2021 through 09/22/2021 with right foot cellulitis and sepsis. On 3/13 he underwent a right below-knee  popliteal to posterior tibial bypass using a greater saphenous vein. 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 He was discharged to [facility] skilled facility. He arrives in clinic today with a large wound area over the entirety of his amputation site extending proximally. The attempted flap closure of the amputation site and his TMA has failed and the wound extends under the attempt to closure. Still some sutures in place. There is an odor but he is not systemically unwell. He is due for follow-up arterial studies and has an appointment with vascular surgery on 10/28/2021. They are using wet-to-dry dressings at [facility]. Past medical history includes type 2 diabetes with peripheral neuropathy, hypertension, obstructive sleep apnea 4/20; patient presents for follow-up. He is being discharged from his facility at [facility] today. They have been doing Dakin's wet-to-dry dressings. He states that his fiance can help with dressing changes. He is getting [facility] home [facility] to come out 3 times a week once he leaves the facility. He is scheduled to see vein and vascular on 5/12. He has not seen the wound himself. He puts covers over his face for wound exams. He currently denies systemic signs of infection. 4/28; patient admitted to the clinic 2 weeks ago with a dehisced right TMA in the setting of type 2 diabetes with significant PAD. He is status post revascularization. He has been discharged from the nursing home he was at and now is at home using Dakin's wet-to-dry dressings daily he has home health. He denies any systemic signs of infection 5/4; patient presents for follow-up. His fiance and home health have been changing his dressings. He currently denies systemic signs of infection. He continues to put sheet covers over his face during our wound encounters because he does not want a look at the wound. 5/25; Patient  presents for follow-up. He missed his last clinic appointment. He had a bone biopsy and culture done at last clinic visit that showed acute osteomyelitis with growth of E. coli, stenotrophomonas maltophilia and enteroccocus faecalis. A referral to infectious disease was made. He has not heard from them yet. He has been using Dakin's wet-to-dry dressings. He is now more comfortable with looking at the wound bed. 6/6; the patient comes into clinic today with 2 wounds on the left foot which apparently have been there for several months. This was not known to assess but was known by Dr. Donzetta Matters. In fact he underwent an angiogram yesterday. He had a laser atherectomy of the left posterior tibial artery with a 3 mm balloon angioplasty, also a stent of the last left SFA. 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 This is a lot better than the last time I saw it. He has been using Dakin's wet-to-dry. He has an appointment with infectious disease for a bone biopsy which showed E. coli, stenotrophomonas and Enterococcus faecalis. The appointment is for tomorrow 04/14/2022 Mr. Maximas Reisdorf ajuddin is an uncontrolled type 2 diabetic on insulin that presents with a left foot ulcer. On 12/18/2021 he required a left fifth ray amputation. He had revision of this site on 6/17 by Dr. Posey Pronto, podiatry. He states he has had a wound VAC on this area for the past 3 weeks. It is unclear what the prior wound care has been. He has a history of osteomyelitis to this area and was treated with IV and oral antibiotics For 6 weeks by Dr. Linus Salmons of infectious disease. At his last follow-up on 02/20/2022 his CRP was trending down and antibiotics were stopped. He also has a history of peripheral arterial disease status post stenting to the left SFA and balloon angioplasty to the left  posterior tibial artery On 12/2021. He has follow-up with vein and vascular at the end of the month. He also has a wound to the dorsal aspect of the left foot that he has been placing Betadine on. 10/19; patient presents for follow-up. He had a bone biopsy of the left foot at last clinic visit showed osteomyelitis. The bone culture showed Enterobacter cloacae, Enterococcus faecalis, Staphylococcus aureus, viridans group strep, actinotigum schaelii. He has been referred to infectious disease And has an appointment on October 25. He has been using Dakin's wet-to-dry dressing to the lateral left foot wound and Hydrofera Blue and Medihoney to the dorsal foot wound. He has no issues or complaints today. He denies systemic signs of infection. 10/26; patient presents for follow-up. He saw Dr. Linus Salmons yesterday with infectious disease and has been prescribed doxycycline and Levaquin for his chronic osteomyelitis. He has been using Dakin's wet-to-dry dressings to the lateral left foot wound and Medihoney and Hydrofera Blue to the dorsal wound. We discussed a skin graft for the dorsal foot wound and he would like to proceed with insurance verification. 11/3; patient with a wound on the dorsal left foot and a substantial postsurgical area on the lateral left foot fifth ray amputation. He is also followed by Dr. Alton Revere of infectious disease. He had a bone biopsy from the left lateral foot that showed acute osteomyelitis. Swab culture showed multiple organisms. He has also been revascularized by infectious disease status post left SFA stenting and tibial laser atherectomy and angioplasty. An MRI of the left foot had LENNON, KUCHARSKI A ([id number]) [id number] Page 5 of 15 previously shown osteomyelitis of the fifth met head we have been using Dakin's wet-to-dry to the left lateral foot and Medihoney and Hydrofera Blue to the wound on the dorsal foot. 11/13; HBO treatment was discussed at  last clinic visit and patient would like to proceed with this. He has orientation today. He has been approved for Grafix. We have been using Hydrofera Blue and Medihoney to the left dorsal foot and collagen to the left lateral foot. Patient has home health that comes in for dressing changes. He currently has no issues or complaints today. 12/7; patient missed his last clinic appointment. He was last seen 3 weeks ago. He is currently off of antibiotics per ID. He completed a prolonged treatment. He has been using collagen to the lateral foot wound. He has been using Medihoney and Hydrofera Blue to the dorsal foot wound.  He has been doing well with hyperbaric oxygen therapy. He has no issues or complaints today. He denies signs of of infection. 12/14; patient presents for follow-up. We have been using Grafix to the wound beds. He has had trouble equalizing the pressure in his ears during HBO treatments. It was recommended he follow-up with ENT He does not have an appointment yet. He currently denies signs of infection. . 12/21; patient presents for follow-up. He was seen by ENT and had tubes placed. He is continued HBO without issues. We have been using Grafix to the wound beds. There is been improvement in wound healing. 12/21; patient presents for follow-up. He has no issues or complaints today. There is been improvement in wound healing. 1/5; patient presents for follow-up. At last clinic visit Grafix was placed in standard fashion. He has no issues or complaints today. He is tolerating hyperbaric oxygen therapy well. He denies signs of infection. 1/11; patient presents for follow-up. We have been placing Grafix to the wound beds on the left foot. The dorsal left foot wound has healed. The left lateral foot wound has shown improvement in healing. Unfortunately has had dehiscence of his previous surgical wound site on the right foot. He currently denies signs of infection. 1/19; patient presents for  follow-up. Grafix was placed to the left foot wound at last clinic visit. He has been using Dakin's wet-to-dry dressings to the right foot wound. He continues HBO therapy without issues. He has shown improvement in wound healing to the left foot. 1/26; patient presents for follow-up. We been placing Grafix to the left foot wound and patient has been using Dakin's wet-to-dry dressings to the right foot wound. There is been improvement in wound healing. He continues HBO therapy without issues. 2/2; patient presents for follow-up. We have been using Grafix to the left foot wound and Dakin's wet-to-dry dressings to the right foot wound. He has no issues or complaints today. 2/8; patient presents for follow-up. We have been using Grafix to the left foot wound and Dakin's wet-to-dry dressings to the right foot wound. He has no issues or complaints today. He continues HBO without issues. 2/15; patient presents for follow-up. We have been using Grafix to the wound beds. He continues HBO without issues. He has been offloading the wound beds with his surgical shoe. 2/22; patient presents for follow-up. We have been using Grafix to the wound beds. He has no issues or complaints today. He continues HBO therapy without issues. 2/29; patient presents for follow-up. We have been using Grafix to the wound beds. There is been improvement in wound healing. He has no issues or complaints today. He continues HBO therapy. 3/7; patient presents for follow-up. We have been using Grafix to the wound beds. Yesterday patient had an arteriogram to the right lower extremity with a drug-eluting stent placed in the superficial femoral artery. He has no issues to report on today. 3/14; patient presents for follow-up. We have been using Grafix to the wound beds. He continues to do HBO therapy without issues. Electronic Signature(s) Signed: 09/17/2022 3:34:03 PM By: [name] DO Entered By: [name] on 09/17/2022  11:56:43 -------------------------------------------------------------------------------- Physical Exam Details Patient Name: Date of Service: [name], [name] 09/17/2022 10:15 A M Medical Record Number: [id number] Patient Account Number: [id number] Date of Birth/Sex: Treating RN: May 18, 1956 (67 y.o. M) Primary Care Provider: [name] Other Clinician: Referring Provider: Treating Provider/Extender: [name], [name] in Treatment: 22 Constitutional respirations regular, non-labored and within  target range for patient.. Cardiovascular 2+ dorsalis pedis/posterior tibialis pulses. Psychiatric pleasant and cooperative. Notes Left foot: T the lateral aspect there is an open wound with granulation tissue and nonviable tissue. No probing to bone . Right foot: Along the previous TMA o site there is an open wound with nonviable tissue, granulation tissue and callus. Does not probe to bone. No signs of surrounding soft tissue infection. Electronic Signature(s) Signed: 09/17/2022 3:34:03 PM By: Elenor Legato [phone] ByKalman Shan DO [phone].pdf Page 6 of 15 Signed: 09/17/2022 3:34:03 Entered By: [name] on 09/17/2022 11:57:29 -------------------------------------------------------------------------------- Physician Orders Details Patient Name: Date of Service: [name], [name] 09/17/2022 10:15 A M Medical Record Number: [id number] Patient Account Number: [id number] Date of Birth/Sex: Treating RN: 1956/05/07 (67 y.o. [name] Primary Care Provider: [name] Other Clinician: Referring Provider: Treating Provider/Extender: [name] in Treatment: 22 Verbal / Phone Orders: No Diagnosis Coding ICD-10 Coding Code Description [id number] Non-pressure chronic ulcer of other part of left foot with fat layer exposed M86.172 Other acute osteomyelitis, left  ankle and foot L97.528 Non-pressure chronic ulcer of other part of left foot with other specified severity E11.621 Type 2 diabetes mellitus with foot ulcer E11.42 Type 2 diabetes mellitus with diabetic polyneuropathy S91.301A Unspecified open wound, right foot, initial encounter 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 severity Follow-up Appointments ppointment in 1 week. - Dr. Heber Miller and Allayne Butcher Rm # 9 Thursday 09/27/22 @ 10:30 Return A Other: - ****ONLY CHANGE OUTTER DRESSINGS ONLY.*** Anesthetic (In clinic) Topical Lidocaine 5% applied to wound bed Cellular or Tissue Based Products Wound #5 Left,Lateral Foot Cellular or Tissue Based Product Type: - Grafix # 15 applied 06/11/22 Grafix # 3 applied 06/25/22 GRafix # 4 applied 07/02/22 Grafix # 5 applied 07/10/22 Grafix # 6 applied 07/17/22 Grafix # 7 applied 07/24/22---Also, go ahead and run IVR for more Grafix for left lateral foot Grafix #8 applied 07/31/2022 Grafix # 9 applied 08/07/22] left foot Grafix #10 applied 08/13/2022 left and right foot wounds today split the grafix in half. Graftx #11 applied to both wounds. Patient right foot approved for total of 12 between dates 08/18/2022-11/04/2022. Grafix #12 applied to both wounds. grafix #4 right foot split grafix and use to left foot #13. total can have 24. Grafix # 5 applied to right foot split grafix and use to left foot # 14. Cellular or Tissue Based Product applied to wound bed, secured with steri-strips, cover with Adaptic or Mepitel. (DO NOT REMOVE). Wound #6 Right Amputation Site - Transmetatarsal Cellular or Tissue Based Product Type: - Grafix # 15 applied 06/11/22 Grafix # 3 applied 06/25/22 GRafix # 4 applied 07/02/22 Grafix # 5 applied 07/10/22 Grafix # 6 applied 07/17/22 Grafix # 7 applied 07/24/22---Also, go ahead and run IVR for more Grafix for left lateral foot Grafix #8 applied 07/31/2022 Grafix # 9 applied 08/07/22] left  foot Grafix #10 applied 08/13/2022 left and right foot wounds today split the grafix in half. Graftx #11 applied to both wounds. Patient right foot approved for total of 12 between dates 08/18/2022-11/04/2022. Grafix #12 applied to both wounds. grafix #4 right foot split grafix and use to left foot #13. total can have 24. Grafix # 5 applied to right foot split grafix and use to left foot # 14. Cellular or Tissue Based Product applied to wound bed, secured with steri-strips, cover with Adaptic or Mepitel. (DO NOT REMOVE). Edema  Control - Lymphedema / SCD / Other Avoid standing for long periods of time. Off-Loading Open toe surgical shoe to: - wear when standing or walking. Hyperbaric Oxygen Therapy Evaluate for HBO Therapy - 07/24/22- Pt. to finish next 8 HBO treatments 07/24/22- DR. Adeli Frost wants pt. to be extended for 40 more treatments Indication: - Wagner Grade III to left lateral foot KAYCEN, DUFFY A ([id number]) [phone].pdf Page 7 of 15 2.5 ATA for 90 Minutes with 2 Five (5) Minute A Breaks ir Total Number of Treatments: - 40; extend for 40 more= 80 treatments One treatments per day (delivered Monday through Friday unless otherwise specified in Special Instructions below): Finger stick Blood Glucose Pre- and Post- HBOT Treatment. Follow Hyperbaric Oxygen Glycemia Protocol Afrin (Oxymetazoline HCL) 0.05% nasal spray - 1 spray in both nostrils daily as needed prior to HBO treatment for difficulty clearing ears Wound Treatment Wound #5 - Foot Wound Laterality: Left, Lateral Cleanser: Wound Cleanser (Generic) 1 x Per Day/30 Days Discharge Instructions: Cleanse the wound with wound cleanser prior to applying a clean dressing using gauze sponges, not tissue or cotton balls. Peri-Wound Care: Skin Prep (Generic) 1 x Per Day/30 Days Discharge Instructions: Use skin prep as directed Prim Dressing: Cutimed Sorbact Swab 1 x Per Day/30 Days ary Discharge Instructions:  Apply to wound bed behind grafix Prim Dressing: Grafix (Generic) 1 x Per Day/30 Days ary Discharge Instructions: applied by provider. Prim Dressing: adaptic and steri-strips 1 x Per Day/30 Days ary Discharge Instructions: to secure grafix to wound bed. leave in place. Secondary Dressing: ABD Pad, 8x10 (Generic) 1 x Per Day/30 Days Discharge Instructions: Apply over primary dressing as directed. Secured With: [employer], 4.5x3.1 (in/yd) (Generic) 1 x Per Day/30 Days Discharge Instructions: Secure with Kerlix as directed. Secured With: 29M Medipore H Soft Cloth Surgical T ape, 4 x 10 (in/yd) (Generic) 1 x Per Day/30 Days Discharge Instructions: Secure with tape as directed. Wound #6 - Amputation Site - Transmetatarsal Wound Laterality: Right Cleanser: Wound Cleanser (Generic) 1 x Per Day/30 Days Discharge Instructions: Cleanse the wound with wound cleanser prior to applying a clean dressing using gauze sponges, not tissue or cotton balls. Peri-Wound Care: Skin Prep (Generic) 1 x Per Day/30 Days Discharge Instructions: Use skin prep as directed Prim Dressing: Grafix (Generic) 1 x Per Day/30 Days ary Discharge Instructions: applied by provider. Prim Dressing: adaptic and steri-strips 1 x Per Day/30 Days ary Discharge Instructions: to secure grafix to wound bed. leave in place. Prim Dressing: cutimed sorbact 1 x Per Day/30 Days ary Discharge Instructions: lightly pack into wound bed behind the grafix. Secondary Dressing: ABD Pad, 8x10 (Generic) 1 x Per Day/30 Days Discharge Instructions: Apply over primary dressing as directed. Secured With: [employer], 4.5x3.1 (in/yd) (Generic) 1 x Per Day/30 Days Discharge Instructions: Secure with Kerlix as directed. Secured With: 29M Medipore H Soft Cloth Surgical T ape, 4 x 10 (in/yd) (Generic) 1 x Per Day/30 Days Discharge Instructions: Secure with tape as directed. GLYCEMIA INTERVENTIONS PROTOCOL PRE-HBO GLYCEMIA  INTERVENTIONS ACTION INTERVENTION Obtain pre-HBO capillary blood glucose (ensure 1 physician order is in chart). A. Notify HBO physician and await physician orders. 2 If result is 70 mg/dl or below: B. If the result meets the hospital definition of a critical result, follow hospital policy. A. Give patient an 8 ounce Glucerna Shake, an 8 ounce Ensure, or 8 ounces of a Glucerna/Ensure equivalent dietary supplement*. B. Wait 30 minutes. If result is 71 mg/dl to 130 mg/dl: C. Retest patients capillary  blood glucose (CBG). D. If result greater than or equal to 110 mg/dl, proceed with HBO. If result less than 110 mg/dl, notify HBO physician and consider holding HBO. If result is 131 mg/dl to 249 mg/dl: A. Proceed with HBO. MAXEN, LITHERLAND A ([id number]) [id number] Page 8 of 15 A. Notify HBO physician and await physician orders. B. It is recommended to hold HBO and do If result is 250 mg/dl or greater: blood/urine ketone testing. C. If the result meets the hospital definition of a critical result, follow hospital policy. POST-HBO GLYCEMIA INTERVENTIONS ACTION INTERVENTION Obtain post HBO capillary blood glucose (ensure 1 physician order is in chart). A. Notify HBO physician and await physician orders. 2 If result is 70 mg/dl or below: B. If the result meets the hospital definition of a critical result, follow hospital policy. A. Give patient an 8 ounce Glucerna Shake, an 8 ounce Ensure, or 8 ounces of a Glucerna/Ensure equivalent dietary supplement*. B. Wait 15 minutes for symptoms of If result is 71 mg/dl to 100 mg/dl: hypoglycemia (i.e. nervousness, anxiety, sweating, chills, clamminess, irritability, confusion, tachycardia or dizziness). C. If patient asymptomatic, discharge patient. If patient symptomatic, repeat capillary blood glucose (CBG) and notify HBO physician. If result is 101 mg/dl to 249 mg/dl: A. Discharge patient. A.  Notify HBO physician and await physician orders. B. It is recommended to do blood/urine ketone If result is 250 mg/dl or greater: testing. C. If the result meets the hospital definition of a critical result, follow hospital policy. *Juice or candies are NOT equivalent products. If patient refuses the Glucerna or Ensure, please consult the hospital dietitian for an appropriate substitute. Electronic Signature(s) Signed: 09/17/2022 3:34:03 PM By: [name] DO Entered By: [name] on 09/17/2022 11:57:40 -------------------------------------------------------------------------------- Problem List Details Patient Name: Date of Service: [name], [name] 09/17/2022 10:15 A M Medical Record Number: [id number] Patient Account Number: [id number] Date of Birth/Sex: Treating RN: 1955-08-09 (67 y.o. [name] Primary Care Provider: [name] Other Clinician: Referring Provider: Treating Provider/Extender: [name] in Treatment: 22 Active Problems ICD-10 Encounter Code Description Active Date MDM Diagnosis [phone] Non-pressure chronic ulcer of other part of left foot with fat layer exposed 04/14/2022 No Yes M86.172 Other acute osteomyelitis, left ankle and foot 05/08/2022 No Yes L97.528 Non-pressure chronic ulcer of other part of left foot with other specified 04/14/2022 No Yes severity E11.621 Type 2 diabetes mellitus with foot ulcer 04/14/2022 No Yes E11.42 Type 2 diabetes mellitus with diabetic polyneuropathy 04/14/2022 No Yes S91.301A Unspecified open wound, right foot, initial encounter 07/16/2022 No Yes BERKLEE, ZACHAR A ([id number]) [phone].pdf Page 9 of 15 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 07/16/2022 No Yes severity Inactive Problems Resolved Problems Electronic Signature(s) Signed: 09/17/2022 3:34:03 PM By: [name] DO Entered By: [name] on 09/17/2022 11:56:00 -------------------------------------------------------------------------------- Progress Note Details Patient Name: Date of Service: [name], [name] 09/17/2022 10:15 A M Medical Record Number: [id number] Patient Account Number: [id number] Date of Birth/Sex: Treating RN: 1955/08/25 (67 y.o. M) Primary Care Provider: [name] Other Clinician: Referring Provider: Treating Provider/Extender: [name], [name] in Treatment: 22 Subjective Chief Complaint Information obtained from Patient 10/16/20; patient is here with a substantial wound on his right foot in the setting of a recent transmetatarsal amputation. 04/14/2022; referral from podiatry for postsurgical wound of nonhealing partial left fifth ray amputation, right foot (07/16/22): TMA dehiscence History of Present Illness (HPI) ADMISSION 10/16/20 This  is a 67 year old man who is a type II diabetic. Initially seen by Dr. Unk Lightning of vein and vascular on 09/10/2021 for bilateral lower extremity rest pain right greater than left. His ABIs demonstrated monophasic waveforms at the ankles bilaterally with depressed toe pressures. His ABI in the right was 0.5 and on the left 0.28. There were no obtainable waveforms for TBI's. He underwent angiography on 09/10/2021 and had findings of severe PAD with 95% stenosis of the distal SFA. He also had peroneal and posterior tibial arteries occluded with significant collateralization in the calf there was reconstitution of the posterior tibial artery in the distal third of the calf. Ostia of the anterior tibial artery was not appreciated. He underwent a angioplasty of the superficial femoral artery. He required admission to hospital from 09/12/2021 through 09/22/2021 with right foot cellulitis and sepsis. On 3/13 he underwent a right below-knee popliteal to posterior tibial bypass using a greater saphenous vein. Ultimately however he required a right TMA by  podiatry which I believe was done on 3/17 for progressive diabetic foot infection. He was discharged to [facility] skilled facility. He arrives in clinic today with a large wound area over the entirety of his amputation site extending proximally. The attempted flap closure of the amputation site and his TMA has failed and the wound extends under the attempt to closure. Still some sutures in place. There is an odor but he is not systemically unwell. He is due for follow-up arterial studies and has an appointment with vascular surgery on 10/28/2021. They are using wet-to-dry dressings at [facility]. Past medical history includes type 2 diabetes with peripheral neuropathy, hypertension, obstructive sleep apnea 4/20; patient presents for follow-up. He is being discharged from his facility at [facility] today. They have been doing Dakin's wet-to-dry dressings. He states that his fiance can help with dressing changes. He is getting [facility] home [facility] to come out 3 times a week once he leaves the facility. He is scheduled to see vein and vascular on 5/12. He has not seen the wound himself. He puts covers over his face for wound exams. He currently denies systemic signs of infection. 4/28; patient admitted to the clinic 2 weeks ago with a dehisced right TMA in the setting of type 2 diabetes with significant PAD. He is status post revascularization. He has been discharged from the nursing home he was at and now is at home using Dakin's wet-to-dry dressings daily he has home health. He denies any systemic signs of infection 5/4; patient presents for follow-up. His fiance and home health have been changing his dressings. He currently denies systemic signs of infection. He continues to put sheet covers over his face during our wound encounters because he does not want a look at the wound. 5/25; Patient presents for follow-up. He missed his last clinic appointment. He had a bone biopsy and culture done at last  clinic visit that showed acute osteomyelitis with growth of E. coli, stenotrophomonas maltophilia and enteroccocus faecalis. A referral to infectious disease was made. He has not heard from them yet. He has been using Dakin's wet-to-dry dressings. He is now more comfortable with looking at the wound bed. 6/6; the patient comes into clinic today with 2 wounds on the left foot which apparently have been there for several months. This was not known to assess but was known by Dr. Donzetta Matters. In fact he underwent an angiogram yesterday. He had a laser atherectomy of the left posterior tibial artery with a 3 mm balloon angioplasty,  also a stent of the last left SFA. 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 This is a lot better than the last time I saw it. He has been using Dakin's wet-to-dry. He has an appointment with infectious disease for a bone biopsy which showed E. coli, stenotrophomonas and Enterococcus faecalis. The appointment is for tomorrow 04/14/2022 Mr. Rayshon Grudzinski ajuddin is an uncontrolled type 2 diabetic on insulin that presents with a left foot ulcer. On 12/18/2021 he required a left fifth ray amputation. He had revision of this site on 6/17 by Dr. Posey Pronto, podiatry. He states he has had a wound VAC on this area for the past 3 weeks. It is unclear what the prior wound care has been. He has a history of osteomyelitis to this area and was treated with IV and oral antibiotics For 6 weeks by Dr. Linus Salmons of infectious disease. At his last follow-up on 02/20/2022 his CRP was trending down and antibiotics were stopped. He also has a history of peripheral arterial disease status post stenting to RIKER, FLEMMING A ([id number]) [id number] Page 10 of 15 the left SFA and balloon angioplasty to the left posterior tibial artery On  12/2021. He has follow-up with vein and vascular at the end of the month. He also has a wound to the dorsal aspect of the left foot that he has been placing Betadine on. 10/19; patient presents for follow-up. He had a bone biopsy of the left foot at last clinic visit showed osteomyelitis. The bone culture showed Enterobacter cloacae, Enterococcus faecalis, Staphylococcus aureus, viridans group strep, actinotigum schaelii. He has been referred to infectious disease And has an appointment on October 25. He has been using Dakin's wet-to-dry dressing to the lateral left foot wound and Hydrofera Blue and Medihoney to the dorsal foot wound. He has no issues or complaints today. He denies systemic signs of infection. 10/26; patient presents for follow-up. He saw Dr. Linus Salmons yesterday with infectious disease and has been prescribed doxycycline and Levaquin for his chronic osteomyelitis. He has been using Dakin's wet-to-dry dressings to the lateral left foot wound and Medihoney and Hydrofera Blue to the dorsal wound. We discussed a skin graft for the dorsal foot wound and he would like to proceed with insurance verification. 11/3; patient with a wound on the dorsal left foot and a substantial postsurgical area on the lateral left foot fifth ray amputation. He is also followed by Dr. Alton Revere of infectious disease. He had a bone biopsy from the left lateral foot that showed acute osteomyelitis. Swab culture showed multiple organisms. He has also been revascularized by infectious disease status post left SFA stenting and tibial laser atherectomy and angioplasty. An MRI of the left foot had previously shown osteomyelitis of the fifth met head we have been using Dakin's wet-to-dry to the left lateral foot and Medihoney and Hydrofera Blue to the wound on the dorsal foot. 11/13; HBO treatment was discussed at last clinic visit and patient would like to proceed with this. He has orientation today. He has been approved for  Grafix. We have been using Hydrofera Blue and Medihoney to the left dorsal foot and collagen to the left lateral foot. Patient has home health that comes in for dressing changes. He currently has no issues or complaints today. 12/7; patient missed his last clinic appointment. He  was last seen 3 weeks ago. He is currently off of antibiotics per ID. He completed a prolonged treatment. He has been using collagen to the lateral foot wound. He has been using Medihoney and Hydrofera Blue to the dorsal foot wound. He has been doing well with hyperbaric oxygen therapy. He has no issues or complaints today. He denies signs of of infection. 12/14; patient presents for follow-up. We have been using Grafix to the wound beds. He has had trouble equalizing the pressure in his ears during HBO treatments. It was recommended he follow-up with ENT He does not have an appointment yet. He currently denies signs of infection. . 12/21; patient presents for follow-up. He was seen by ENT and had tubes placed. He is continued HBO without issues. We have been using Grafix to the wound beds. There is been improvement in wound healing. 12/21; patient presents for follow-up. He has no issues or complaints today. There is been improvement in wound healing. 1/5; patient presents for follow-up. At last clinic visit Grafix was placed in standard fashion. He has no issues or complaints today. He is tolerating hyperbaric oxygen therapy well. He denies signs of infection. 1/11; patient presents for follow-up. We have been placing Grafix to the wound beds on the left foot. The dorsal left foot wound has healed. The left lateral foot wound has shown improvement in healing. Unfortunately has had dehiscence of his previous surgical wound site on the right foot. He currently denies signs of infection. 1/19; patient presents for follow-up. Grafix was placed to the left foot wound at last clinic visit. He has been using Dakin's wet-to-dry  dressings to the right foot wound. He continues HBO therapy without issues. He has shown improvement in wound healing to the left foot. 1/26; patient presents for follow-up. We been placing Grafix to the left foot wound and patient has been using Dakin's wet-to-dry dressings to the right foot wound. There is been improvement in wound healing. He continues HBO therapy without issues. 2/2; patient presents for follow-up. We have been using Grafix to the left foot wound and Dakin's wet-to-dry dressings to the right foot wound. He has no issues or complaints today. 2/8; patient presents for follow-up. We have been using Grafix to the left foot wound and Dakin's wet-to-dry dressings to the right foot wound. He has no issues or complaints today. He continues HBO without issues. 2/15; patient presents for follow-up. We have been using Grafix to the wound beds. He continues HBO without issues. He has been offloading the wound beds with his surgical shoe. 2/22; patient presents for follow-up. We have been using Grafix to the wound beds. He has no issues or complaints today. He continues HBO therapy without issues. 2/29; patient presents for follow-up. We have been using Grafix to the wound beds. There is been improvement in wound healing. He has no issues or complaints today. He continues HBO therapy. 3/7; patient presents for follow-up. We have been using Grafix to the wound beds. Yesterday patient had an arteriogram to the right lower extremity with a drug-eluting stent placed in the superficial femoral artery. He has no issues to report on today. 3/14; patient presents for follow-up. We have been using Grafix to the wound beds. He continues to do HBO therapy without issues. Patient History Information obtained from Patient, Chart. Family History Diabetes - Mother,Siblings, Heart Disease - Siblings, Hypertension - Siblings, Stroke - Mother, No family history of Cancer, Hereditary Spherocytosis, Kidney  Disease, Lung Disease, Seizures, Thyroid Problems,  Tuberculosis. Social History Former smoker, Marital Status - Single, Alcohol Use - Never, Drug Use - No History, Caffeine Use - Daily. Medical History Eyes Denies history of Cataracts, Glaucoma, Optic Neuritis Respiratory Patient has history of Asthma, Sleep Apnea Denies history of Aspiration, Chronic Obstructive Pulmonary Disease (COPD), Pneumothorax, Tuberculosis Cardiovascular Patient has history of Hypertension, Peripheral Venous Disease Denies history of Angina, Arrhythmia, Congestive Heart Failure, Coronary Artery Disease, Deep Vein Thrombosis, Hypotension, Myocardial Infarction, Peripheral Arterial Disease, Phlebitis, Vasculitis Endocrine Patient has history of Type II Diabetes Denies history of Type I Diabetes Integumentary (Skin) VINNY, COLEE A ([id number] Page 11 of 15 Denies history of History of Burn Musculoskeletal Patient has history of Osteomyelitis Denies history of Gout, Rheumatoid Arthritis, Osteoarthritis Neurologic Patient has history of Neuropathy Denies history of Dementia, Quadriplegia, Paraplegia, Seizure Disorder Hospitalization/Surgery History - 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 Posey Pronto. - aortogram 12/08/2021 Dr. Virl Cagey [facility]. Medical A Surgical History Notes nd Psychiatric PTSD Objective Constitutional respirations regular, non-labored and within target range for patient.. Vitals Time Taken: 10:59 AM, Height: 72 in, Weight: 220 lbs, BMI: 29.8, Temperature: 97.0 F, Pulse: 78 bpm, Respiratory Rate: 18 breaths/min, Blood Pressure: 157/96 mmHg, Capillary Blood Glucose: 116 mg/dl. General Notes: glucose post HBOT Cardiovascular 2+ dorsalis pedis/posterior tibialis pulses. Psychiatric pleasant and cooperative. General Notes: Left foot: T the lateral aspect there is an open wound with granulation tissue and nonviable tissue. No probing to  bone . Right foot: Along the o previous TMA site there is an open wound with nonviable tissue, granulation tissue and callus. Does not probe to bone. No signs of surrounding soft tissue infection. Integumentary (Hair, Skin) Wound #5 status is Open. Original cause of wound was Surgical Injury. The date acquired was: 12/20/2021. The wound has been in treatment 22 weeks. The wound is located on the Left,Lateral Foot. The wound measures 2.5cm length x 0.2cm width x 0.3cm depth; 0.393cm^2 area and 0.118cm^3 volume. There is Fat Layer (Subcutaneous Tissue) exposed. There is no tunneling or undermining noted. There is a medium amount of serosanguineous drainage noted. The wound margin is epibole. There is large (67-100%) red granulation within the wound bed. There is a small (1-33%) amount of necrotic tissue within the wound bed including Adherent Slough. The periwound skin appearance had no abnormalities noted for color. The periwound skin appearance exhibited: Callus. The periwound skin appearance did not exhibit: Crepitus, Excoriation, Induration, Rash, Scarring, Dry/Scaly, Maceration. Periwound temperature was noted as No Abnormality. The periwound has tenderness on palpation. Wound #6 status is Open. Original cause of wound was Gradually Appeared. The date acquired was: 07/16/2022. The wound has been in treatment 9 weeks. The wound is located on the Right Amputation Site - Transmetatarsal. The wound measures 0.4cm length x 0.3cm width x 0.5cm depth; 0.094cm^2 area and 0.047cm^3 volume. There is Fat Layer (Subcutaneous Tissue) exposed. There is no tunneling noted, however, there is undermining starting at 12:00 and ending at 12:00 with a maximum distance of 0.3cm. There is a small amount of serosanguineous drainage noted. The wound margin is distinct with the outline attached to the wound base. There is large (67-100%) red granulation within the wound bed. There is no necrotic tissue within the wound bed.  The periwound skin appearance had no abnormalities noted for color. The periwound skin appearance exhibited: Callus, Dry/Scaly. The periwound skin appearance did not exhibit: Crepitus, Excoriation, Induration, Rash, Scarring, Maceration. Periwound temperature was noted as No Abnormality. Assessment Active Problems ICD-10 Non-pressure chronic ulcer of other  part of left foot with fat layer exposed Other acute osteomyelitis, left ankle and foot Non-pressure chronic ulcer of other part of left foot with other specified severity Type 2 diabetes mellitus with foot ulcer Type 2 diabetes mellitus with diabetic polyneuropathy Unspecified open wound, right foot, initial encounter 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 severity Patient's left lateral foot wound appears well-healing. I debrided nonviable tissue and Grafix was placed in standard fashion. The right TMA amputation site wound is stable. I debrided nonviable tissue here and placed Grafix in standard fashion as well. Continue aggressive offloading with surgical shoe. Continue HBO therapy. Follow-up in 1 week. Procedures AUNER, DETTLING A ([id number]) [id number] Page 12 of 15 Wound #5 Pre-procedure diagnosis of Wound #5 is a Diabetic Wound/Ulcer of the Lower Extremity located on the Left,Lateral Foot .Severity of Tissue Pre Debridement is: Fat layer exposed. There was a Selective/Open Wound Non-Viable Tissue Debridement with a total area of 1.25 sq cm performed by [name], DO. With the following instrument(s): Curette to remove Non-Viable tissue/material. Material removed includes Slough after achieving pain control using Lidocaine 4% T opical Solution. No specimens were taken. A time out was conducted at 11:25, prior to the start of the procedure. A Minimum amount of bleeding was controlled with Pressure. The procedure was tolerated well with a pain level of 0  throughout and a pain level of 0 following the procedure. Post Debridement Measurements: 2.5cm length x 0.2cm width x 0.3cm depth; 0.118cm^3 volume. Character of Wound/Ulcer Post Debridement is improved. Severity of Tissue Post Debridement is: Fat layer exposed. Post procedure Diagnosis Wound #5: Same as Pre-Procedure General Notes: scribed by Baruch Gouty, RN for Dr. Heber Kiefer. Pre-procedure diagnosis of Wound #5 is a Diabetic Wound/Ulcer of the Lower Extremity located on the Left,Lateral Foot. A skin graft procedure using a bioengineered skin substitute/cellular or tissue based product was performed by [name], DO with the following instrument(s): Forceps and Scissors. Grafix prime was applied and secured with Steri-Strips. 1 sq cm of product was utilized and 0 sq cm was wasted. Post Application, sorbact,, adatptic was applied. A Time Out was conducted at 11:30, prior to the start of the procedure. The procedure was tolerated well with a pain level of 0 throughout and a pain level of 0 following the procedure. Post procedure Diagnosis Wound #5: Same as Pre-Procedure . 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 There was a Selective/Open Wound Skin/Epidermis Debridement with a total area of 2 sq cm performed by [name], DO. With the following instrument(s): Curette to remove Non-Viable tissue/material. Material removed includes Callus and Skin: Epidermis and after achieving pain control using Lidocaine 4% Topical Solution. No specimens were taken. A time out was conducted at 11:25, prior to the start of the procedure. A Minimum amount of bleeding was controlled with Pressure. The procedure was tolerated well with a pain level of 0 throughout and a pain level of 0 following the procedure. Post Debridement Measurements:  0.8cm length x 0.5cm width x 0.5cm depth; 0.157cm^3 volume. Character of Wound/Ulcer Post Debridement is improved. Severity of Tissue Post Debridement is: Fat layer exposed. Post procedure Diagnosis Wound #6: Same as Pre-Procedure General Notes: scribed for Dr. Heber  by Baruch Gouty, RN. Pre-procedure diagnosis of Wound #6 is a Diabetic Wound/Ulcer of the Lower Extremity located on the Right Amputation  Site - Transmetatarsal. A skin graft procedure using a bioengineered skin substitute/cellular or tissue based product was performed by [name], DO with the following instrument(s): Forceps and Scissors. Grafix prime was applied and secured with Steri-Strips. 1 sq cm of product was utilized and 0 sq cm was wasted. Post Application, sorbact,, adatptic was applied. A Time Out was conducted at 11:30, prior to the start of the procedure. The procedure was tolerated well with a pain level of 0 throughout and a pain level of 0 following the procedure. Post procedure Diagnosis Wound #6: Same as Pre-Procedure . Plan Follow-up Appointments: Return Appointment in 1 week. - Dr. Heber [place] and Allayne Butcher Rm # 9 Thursday 09/27/22 @ 10:30 Other: - ****ONLY CHANGE OUTTER DRESSINGS ONLY.*** Anesthetic: (In clinic) Topical Lidocaine 5% applied to wound bed Cellular or Tissue Based Products: Wound #5 Left,Lateral Foot: Cellular or Tissue Based Product Type: - Grafix # 15 applied 06/11/22 Grafix # 3 applied 06/25/22 GRafix # 4 applied 07/02/22 Grafix # 5 applied 07/10/22 Grafix # 6 applied 07/17/22 Grafix # 7 applied 07/24/22---Also, go ahead and run IVR for more Grafix for left lateral foot Grafix #8 applied 07/31/2022 Grafix # 9 applied 08/07/22] left foot Grafix #10 applied 08/13/2022 left and right foot wounds today split the grafix in half. Graftx #11 applied to both wounds. Patient right foot approved for total of 12 between dates 08/18/2022-11/04/2022. Grafix #12 applied to both wounds. grafix #4 right foot  split grafix and use to left foot #13. total can have 24. Grafix # 5 applied to right foot split grafix and use to left foot # 14. Cellular or Tissue Based Product applied to wound bed, secured with steri-strips, cover with Adaptic or Mepitel. (DO NOT REMOVE). Wound #6 Right Amputation Site - Transmetatarsal: Cellular or Tissue Based Product Type: - Grafix # 15 applied 06/11/22 Grafix # 3 applied 06/25/22 GRafix # 4 applied 07/02/22 Grafix # 5 applied 07/10/22 Grafix # 6 applied 07/17/22 Grafix # 7 applied 07/24/22---Also, go ahead and run IVR for more Grafix for left lateral foot Grafix #8 applied 07/31/2022 Grafix # 9 applied 08/07/22] left foot Grafix #10 applied 08/13/2022 left and right foot wounds today split the grafix in half. Graftx #11 applied to both wounds. Patient right foot approved for total of 12 between dates 08/18/2022-11/04/2022. Grafix #12 applied to both wounds. grafix #4 right foot split grafix and use to left foot #13. total can have 24. Grafix # 5 applied to right foot split grafix and use to left foot # 14. Cellular or Tissue Based Product applied to wound bed, secured with steri-strips, cover with Adaptic or Mepitel. (DO NOT REMOVE). Edema Control - Lymphedema / SCD / Other: Avoid standing for long periods of time. Off-Loading: Open toe surgical shoe to: - wear when standing or walking. Hyperbaric Oxygen Therapy: Evaluate for HBO Therapy - 07/24/22- Pt. to finish next 8 HBO treatments 07/24/22- DR. Evee Liska wants pt. 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 Follow Hyperbaric Oxygen Glycemia Protocol Afrin (Oxymetazoline HCL) 0.05%  nasal spray - 1 spray in both nostrils daily as needed prior to HBO treatment for difficulty clearing ears WOUND #5: - Foot  Wound Laterality: Left, Lateral Cleanser: Wound Cleanser (Generic) 1 x Per Day/30 Days Discharge Instructions: Cleanse the wound with wound cleanser prior to applying a clean dressing using gauze sponges, not tissue or cotton balls. Peri-Wound Care: Skin Prep (Generic) 1 x Per Day/30 Days Discharge Instructions: Use skin prep as directed Prim Dressing: Cutimed Sorbact Swab 1 x Per Day/30 Days ary Discharge Instructions: Apply to wound bed behind grafix Prim Dressing: Grafix (Generic) 1 x Per Day/30 Days ary Discharge Instructions: applied by provider. Prim Dressing: adaptic and steri-strips 1 x Per Day/30 Days ary Discharge Instructions: to secure grafix to wound bed. leave in place. Secondary Dressing: ABD Pad, 8x10 (Generic) 1 x Per Day/30 Days Discharge Instructions: Apply over primary dressing as directed. JAZMON, KIBBY A ([id number]) [id number] Page 13 of 15 Secured With: [employer], 4.5x3.1 (in/yd) (Generic) 1 x Per Day/30 Days Discharge Instructions: Secure with Kerlix as directed. Secured With: 89M Medipore H Soft Cloth Surgical T ape, 4 x 10 (in/yd) (Generic) 1 x Per Day/30 Days Discharge Instructions: Secure with tape as directed. WOUND #6: - Amputation Site - Transmetatarsal Wound Laterality: Right Cleanser: Wound Cleanser (Generic) 1 x Per Day/30 Days Discharge Instructions: Cleanse the wound with wound cleanser prior to applying a clean dressing using gauze sponges, not tissue or cotton balls. Peri-Wound Care: Skin Prep (Generic) 1 x Per Day/30 Days Discharge Instructions: Use skin prep as directed Prim Dressing: Grafix (Generic) 1 x Per Day/30 Days ary Discharge Instructions: applied by provider. Prim Dressing: adaptic and steri-strips 1 x Per Day/30 Days ary Discharge Instructions: to secure grafix to wound  bed. leave in place. Prim Dressing: cutimed sorbact 1 x Per Day/30 Days ary Discharge Instructions: lightly pack into wound bed behind the grafix. Secondary Dressing: ABD Pad, 8x10 (Generic) 1 x Per Day/30 Days Discharge Instructions: Apply over primary dressing as directed. Secured With: [employer], 4.5x3.1 (in/yd) (Generic) 1 x Per Day/30 Days Discharge Instructions: Secure with Kerlix as directed. Secured With: 89M Medipore H Soft Cloth Surgical T ape, 4 x 10 (in/yd) (Generic) 1 x Per Day/30 Days Discharge Instructions: Secure with tape as directed. 1. In office sharp debridement 2. Grafix placed in standard fashion 3. Continue aggressive offloadingoosurgical shoe 4. Continue HBO therapy 5. Follow-up in 1 week Electronic Signature(s) Signed: 09/17/2022 3:34:03 PM By: [name] DO Entered By: [name] on 09/17/2022 11:59:05 -------------------------------------------------------------------------------- HxROS Details Patient Name: Date of Service: [name], [name] 09/17/2022 10:15 A M Medical Record Number: [id number] Patient Account Number: [id number] Date of Birth/Sex: Treating RN: [birth year] (67 y.o. M) Primary Care Provider: [name] Other Clinician: Referring Provider: Treating Provider/Extender: [name] in Treatment: 22 Information Obtained From Patient Chart Eyes Medical History: Negative for: Cataracts; Glaucoma; Optic Neuritis Respiratory Medical History: Positive for: Asthma; Sleep Apnea Negative for: Aspiration; Chronic Obstructive Pulmonary Disease (COPD); Pneumothorax; Tuberculosis Cardiovascular Medical History: Positive for: Hypertension; Peripheral Venous Disease Negative for: Angina; Arrhythmia; Congestive Heart Failure; Coronary Artery Disease; Deep Vein Thrombosis; Hypotension; Myocardial Infarction; Peripheral Arterial Disease; Phlebitis; Vasculitis Endocrine Medical History: Positive for:  Type II Diabetes Negative for: Type I Diabetes Time with diabetes: 20 years Treated with: Insulin, Oral agents Blood sugar tested every day: Yes Tested : 4x day Integumentary (Skin) Medical History: Negative for: History of Burn MARVIS, MILLISON A ([id number]) [id number] Page 14 of 15 Musculoskeletal Medical History: Positive for: Osteomyelitis Negative for: Gout; Rheumatoid Arthritis; Osteoarthritis Neurologic Medical History: Positive for: Neuropathy Negative for: Dementia; Quadriplegia; Paraplegia; Seizure Disorder  Psychiatric Medical History: Past Medical History Notes: PTSD Immunizations Pneumococcal Vaccine: Received Pneumococcal Vaccination: No Implantable Devices None Hospitalization / Surgery History Type of Hospitalization/Surgery 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 Posey Pronto aortogram 12/08/2021 Dr. Virl Cagey [facility] Family and Social History Cancer: No; Diabetes: Yes - Mother,Siblings; Heart Disease: Yes - Siblings; Hereditary Spherocytosis: No; Hypertension: Yes - Siblings; Kidney Disease: No; Lung Disease: No; Seizures: No; Stroke: Yes - Mother; Thyroid Problems: No; Tuberculosis: No; Former smoker; Marital Status - Single; Alcohol Use: Never; Drug Use: No History; Caffeine Use: Daily; Financial Concerns: No; Food, Clothing or Shelter Needs: No; Support System Lacking: No; Transportation Concerns: No Electronic Signature(s) Signed: 09/17/2022 3:34:03 PM By: [name] DO Entered By: [name] on 09/17/2022 11:56:48 -------------------------------------------------------------------------------- SuperBill Details Patient Name: Date of Service: [name], [name] 09/17/2022 Medical Record Number: [id number] Patient Account Number: [id number] Date of Birth/Sex: Treating RN: April 21, 1956 (67 y.o. M) Primary Care Provider: [name] Other Clinician: Referring Provider: Treating Provider/Extender: [name], [name] in Treatment: 22 Diagnosis Coding ICD-10 Codes Code Description [phone] Non-pressure chronic ulcer of other part of left foot with fat layer exposed M86.172 Other acute osteomyelitis, left ankle and foot L97.528 Non-pressure chronic ulcer of other part of left foot with other specified severity E11.621 Type 2 diabetes mellitus with foot ulcer E11.42 Type 2 diabetes mellitus with diabetic polyneuropathy S91.301A Unspecified open wound, right foot, initial encounter 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 severity Facility Procedures : CPT4 Code: [id number] Description: Q4133- Grafix PL 16 mm disc (2 units) Modifier: Quantity: 2 : [place], ROPP Code: [id number] Nyzaiah A ([id number] L9 L9 E1 Description: [id number] - SKIN SUB GRAFT FACE/NK/HF/G ICD-10 Diagnosis Description 0) [phone] 7.522 Non-pressure chronic ulcer of other part of left foot with fat layer exposed 7.518 Non-pressure chronic ulcer of other part of right foot with  other specified severity 1.621 Type 2 diabetes mellitus with foot ulcer Modifier: [id number] Quantity: 1 df Page 15 of 15 Physician Procedures : CPT4 Code Description Modifier [id number] - WC PHYS SKIN SUB GRAFT FACE/NK/HF/G ICD-10 Diagnosis Description 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.518 Non-pressure chronic ulcer of other part of  right foot with other specified severity E11.621 Type 2 diabetes mellitus with foot ulcer Quantity: 1 Electronic Signature(s) Signed: 09/17/2022 3:34:03 PM By: [name] DO Entered By: [name] on 09/17/2022 11:59:44

## 2022-09-18 NOTE — Progress Notes (Signed)
KUPONO, GOROSTIETA [name] ([id number]) [id number] Page 1 of 2 Visit Report for 09/16/2022 Problem List Details Patient Name: Date of Service: [name], [name]. 09/16/2022 8:00 [name] Medical Record Number: [id number] Patient Account Number: [id number] Date of Birth/Sex: Treating RN: 01/02/1956 (67 y.o. [name] Primary Care Provider: [name] Other Clinician: Valeria Batman Referring Provider: Treating Provider/Extender: [name] in Treatment: 22 Active Problems ICD-10 Encounter Code Description Active Date MDM Diagnosis [phone] Non-pressure chronic ulcer of other part of left foot with fat layer 04/14/2022 No Yes exposed M86.172 Other acute osteomyelitis, left ankle and foot 05/08/2022 No Yes L97.528 Non-pressure chronic ulcer of other part of left foot with other 04/14/2022 No Yes specified severity E11.621 Type 2 diabetes mellitus with foot ulcer 04/14/2022 No Yes E11.42 Type 2 diabetes mellitus with diabetic polyneuropathy 04/14/2022 No Yes S91.301A Unspecified open wound, right foot, initial encounter 07/16/2022 No Yes 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 07/16/2022 No Yes specified severity Inactive Problems Resolved Problems Electronic Signature(s) Signed: 09/16/2022 11:06:10 AM By: Valeria Batman EMT Signed: 09/17/2022 3:34:03 PM By: [name] DO Entered By: Valeria Batman on 09/16/2022 11:06:09 -------------------------------------------------------------------------------- SuperBill Details Patient Name: Date of Service: [name]. 09/16/2022 Medical Record Number: [id number] Patient Account Number: [id number] Date of Birth/Sex: Treating RN: [birth year] (67 y.o. [name] Primary Care Provider: [name] Other Clinician: Valeria Batman Referring Provider: Treating Provider/Extender: [name], [name] in Treatment:  [address] [name] ([id number]) [id number] Page 2 of 2 ICD-10 Codes Code Description [phone] Non-pressure chronic ulcer of other part of left foot with fat layer exposed M86.172 Other acute osteomyelitis, left ankle and foot L97.528 Non-pressure chronic ulcer of other part of left foot with other specified severity E11.621 Type 2 diabetes mellitus with foot ulcer E11.42 Type 2 diabetes mellitus with diabetic polyneuropathy S91.301A Unspecified open wound, right foot, initial encounter 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 severity Facility Procedures : CPT4 Code Description: [id number]-(Facility Use Only) HBOT full body chamber, 104min , ICD-10 Diagnosis Description E11.621 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.522 Non-pressure chronic ulcer of other part of left foot with fat lay L97.528  Non-pressure chronic ulcer of other part of left foot with other s M86.172 Other acute osteomyelitis, left ankle and foot Modifier: er exposed pecified severity Quantity: 4 Physician Procedures : CPT4 Code Description Modifier [id number] - WC PHYS HYPERBARIC OXYGEN THERAPY ICD-10 Diagnosis Description E11.621 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.528  Non-pressure chronic ulcer of other part of left foot with other specified severit M86.172 Other acute osteomyelitis, left ankle and foot Quantity: 1 y Electronic Signature(s) Signed: 09/16/2022 11:06:04 AM By: Valeria Batman EMT Signed: 09/17/2022 3:34:03 PM By: [name] DO Entered By: Valeria Batman on 09/16/2022 11:06:03

## 2022-09-18 NOTE — Progress Notes (Addendum)
[name], [name] A ([id number]) [id number] Page 1 of 2 Visit Report for 09/18/2022 HBO Details Patient Name: Date of Service: [name], [name] 09/18/2022 8:00 A M Medical Record Number: [id number] Patient Account Number: [id number] Date of Birth/Sex: Treating RN: 1955/11/13 (67 y.o. [name] Primary Care Nyree Applegate: [name] Other Clinician: [name] Referring Simon Aaberg: Treating Shawon Denzer/Extender: [name] in Treatment: 22 HBO Treatment Course Details Treatment Course Number: 1 Ordering Aayan Haskew: [name] T Treatments Ordered: otal 80 HBO Treatment Start Date: 06/01/2022 HBO Indication: Diabetic Ulcer(s) of the Lower Extremity Standard/Conservative Wound Care tried and failed greater than or equal to 30 days HBO Treatment Details Treatment Number: 66 Patient Type: Outpatient Chamber Type: Monoplace Chamber Serial #: [id number] Treatment Protocol: 2.5 ATA with 90 minutes oxygen, with two 5 minute air breaks Treatment Details Compression Rate Down: 1.0 psi / minute De-Compression Rate Up: 1.0 psi / minute A breaks and ir breathing Compress Tx Pressure Decompress Decompress periods Begins Reached Begins Ends (leave unused spaces blank) Chamber Pressure (ATA 1 2.5 2.5 2.5 2.5 2.5 - - 2.5 1 ) Clock Time (24 hr) 08:27 - - - - - --- 08:57 Treatment Length: 30 (minutes) Treatment Segments: 1 Vital Signs Capillary Blood Glucose Reference Range: 80 - 120 mg / dl HBO Diabetic Blood Glucose Intervention Range: <131 mg/dl or >249 mg/dl Type: Time Vitals Blood Respiratory Capillary Blood Glucose Pulse Action Pulse: Temperature: Taken: Pressure: Rate: Glucose (mg/dl): Meter #: Oximetry (%) Taken: Pre 07:42 116/71 87 18 98.1 226 1 none per protocol Post 08:57 137/78 83 97.0 97 178 1 none per protocol Treatment Response Treatment Toleration: Poor Adverse Events: 1:Barotrauma - Sinus Treatment  Completion Status: Treatment Aborted/Not Restarted Reason: Adverse Event Treatment Notes Patient arrived, vital signs were taken and were within normal range. Patient prepared for treatment. After performing safety check, patient was placed in the chamber which was pressurized at 1 psi/min. Patient started feeling sinus pressure and pain at approximately 3 psig. Chamber pressure reversed and turned off to return to ambient pressure. Mr. Dornbush attempted a second time with flow rate on maximum causing compression rate to be approximately 0.8 psi/min. At a slower rate patient was able to reach 4 psig, however was not able to equalize sinus pressure and treatment was aborted and canceled. Post-treatment vital signs were within normal range. Patient was stable upon discharge. Additional Procedure Documentation Tissue Sevierity: Necrosis of bone Physician HBO Attestation: I certify that I supervised this HBO treatment in accordance with Medicare guidelines. A trained emergency response team is readily available per Yes hospital policies and procedures. Continue HBOT as ordered. Yes Electronic Signature(s) Signed: 09/18/2022 1:36:39 PM By: [name] DO Previous Signature: 09/18/2022 11:17:13 AM Version By: [name] CHT EMT BS , , Previous Signature: 09/18/2022 12:33:12 PM Version By: Elenor Legato ([id number] Page 2 of 2 Previous Signature: 09/18/2022 12:33:12 PM Version By: [name] DO Previous Signature: 09/18/2022 11:13:26 AM Version By: [name] CHT EMT BS , , Entered By: [name] on 09/18/2022 13:35:08 -------------------------------------------------------------------------------- HBO Safety Checklist Details Patient Name: Date of Service: [name], [name] 09/18/2022 8:00 A M Medical Record Number: [id number] Patient Account Number: [id number] Date of Birth/Sex: Treating RN: 11-11-1955  (67 y.o. [name] Primary Care Reef Achterberg: [name] Other Clinician: Valeria Batman Referring Rylend Pietrzak: Treating Derin Matthes/Extender: [name] in Treatment: 22 HBO Safety Checklist Items Safety Checklist Consent Form Signed Patient voided /  foley secured and emptied When did you last eato 0700 Last dose of injectable or oral agent 0625 Ostomy pouch emptied and vented if applicable NA All implantable devices assessed, documented and approved NA Intravenous access site secured and place NA Valuables secured Linens and cotton and cotton/polyester blend (less than 51% polyester) Personal oil-based products / skin lotions / body lotions removed Wigs or hairpieces removed NA Smoking or tobacco materials removed NA Books / newspapers / magazines / loose paper removed Cologne, aftershave, perfume and deodorant removed Jewelry removed (may wrap wedding band) Make-up removed NA Hair care products removed Battery operated devices (external) removed Heating patches and chemical warmers removed Titanium eyewear removed Nail polish cured greater than 10 hours NA Casting material cured greater than 10 hours NA Hearing aids removed NA Loose dentures or partials removed dentures removed Prosthetics have been removed NA Patient demonstrates correct use of air break device (if applicable) Patient concerns have been addressed Patient grounding bracelet on and cord attached to chamber Specifics for Inpatients (complete in addition to above) Medication sheet sent with patient NA Intravenous medications needed or due during therapy sent with patient NA Drainage tubes (e.g. nasogastric tube or chest tube secured and vented) NA Endotracheal or Tracheotomy tube secured NA Cuff deflated of air and inflated with saline NA Airway suctioned NA Notes Paper version used prior to treatment start. Electronic Signature(s) Signed: 09/18/2022 11:07:03 AM  By: [name] CHT EMT BS , , Entered By: [name] on 09/18/2022 11:07:03

## 2022-09-18 NOTE — Progress Notes (Signed)
SHARAN, ASHMEAD [name] ([id number]) [id number] Page 1 of 1 Visit Report for 09/18/2022 SuperBill Details Patient Name: Date of Service: [name], [name]. 09/18/2022 Medical Record Number: [id number] Patient Account Number: [id number] Date of Birth/Sex: Treating RN: 17-Apr-1956 (67 y.o. [name] Primary Care Provider: [name] Other Clinician: [name] Referring Provider: Treating Provider/Extender: [name] in Treatment: 22 Diagnosis Coding ICD-10 Codes Code Description [phone] Non-pressure chronic ulcer of other part of left foot with fat layer exposed M86.172 Other acute osteomyelitis, left ankle and foot L97.528 Non-pressure chronic ulcer of other part of left foot with other specified severity E11.621 Type 2 diabetes mellitus with foot ulcer E11.42 Type 2 diabetes mellitus with diabetic polyneuropathy S91.301A Unspecified open wound, right foot, initial encounter 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 severity Facility Procedures CPT4 Code Description Modifier Quantity [id number]-(Facility Use Only) HBOT full body chamber, 74min , 1 ICD-10 Diagnosis Description E11.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 M86.172 Other acute osteomyelitis, left ankle and foot Physician Procedures Quantity CPT4 Code Description Modifier [id number] - WC PHYS HYPERBARIC OXYGEN THERAPY 1 ICD-10 Diagnosis Description E11.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 M86.172 Other acute osteomyelitis, left ankle and  foot Electronic Signature(s) Signed: 09/18/2022 11:17:47 AM By: [name] CHT EMT BS , , Signed: 09/18/2022 12:33:12 PM By: [name] DO Entered By: [name] on 09/18/2022 11:17:46

## 2022-09-18 NOTE — Progress Notes (Signed)
[name] [name], [name] [name] ([id number]) [id number] Page 1 of 9 Visit Report for 09/17/2022 Arrival Information Details Patient Name: Date of Service: [name] [name], [name] [name]. 09/17/2022 10:15 [name] Medical Record Number: [id number] Patient Account Number: [id number] Date of Birth/Sex: Treating RN: 1956/05/08 (67 y.o. [name] [name] Primary Care Federica Allport: [name] Other Clinician: Referring Chrisie Jankovich: Treating Merranda Bolls/Extender: [name] in Treatment: 63 Visit Information History Since Last Visit Added or deleted any medications: No Patient Arrived: Wheel Chair Any new allergies or adverse reactions: No Arrival Time: 10:58 Had [name] fall or experienced change in No Accompanied By: self activities of daily living that may affect Transfer Assistance: None risk of falls: Patient Identification Verified: Yes Signs or symptoms of abuse/neglect since last visito No Secondary Verification Process Completed: Yes Hospitalized since last visit: No Patient Requires Transmission-Based Precautions: No Implantable device outside of the clinic excluding No Patient Has Alerts: Yes cellular tissue based products placed in the center since last visit: Has Dressing in Place as Prescribed: Yes Pain Present Now: Yes Electronic Signature(s) Signed: 09/17/2022 4:07:02 PM By: Baruch Gouty RN, [name] Entered By: Baruch Gouty on 09/17/2022 10:59:35 -------------------------------------------------------------------------------- Encounter Discharge Information Details Patient Name: Date of Service: [name] [name], [name] [name]. 09/17/2022 10:15 [name] Medical Record Number: [id number] Patient Account Number: [id number] Date of Birth/Sex: Treating RN: 1955/08/05 (67 y.o. [name] [name] Primary Care Jarely Juncaj: [name] Other Clinician: Referring Wells Gerdeman: Treating Shalon Councilman/Extender: [name] in Treatment: 22 Encounter Discharge  Information Items Post Procedure Vitals Discharge Condition: Stable Temperature (F): 97 Ambulatory Status: Wheelchair Pulse (bpm): 78 Discharge Destination: Home Respiratory Rate (breaths/min): 18 Transportation: Private Auto Blood Pressure (mmHg): 157/96 Accompanied By: self Schedule Follow-up Appointment: Yes Clinical Summary of Care: Patient Declined Electronic Signature(s) Signed: 09/17/2022 4:07:02 PM By: Baruch Gouty RN, [name] Entered By: Baruch Gouty on 09/17/2022 12:00:22 -------------------------------------------------------------------------------- Lower Extremity Assessment Details Patient Name: Date of Service: [name] [name], [name] [name]. 09/17/2022 10:15 [name] Medical Record Number: [id number] Patient Account Number: [id number] Date of Birth/Sex: Treating RN: [birth year] (67 y.o. [name] [name] Primary Care Emari Hreha: [name] Other Clinician: Referring Quashaun Lazalde: Treating Jules Baty/Extender: [name], [name] in Treatment: 22 Edema Assessment Assessed: [Left: No] [Right: No] T[LeftZIGMONT, FEINGOLD [id number] Patrice [id number] Page 2 of 9] Edema: [Left: No] [Right: No] Calf Left: Right: Point of Measurement: 36 cm From Medial Instep 37 cm 40 cm Ankle Left: Right: Point of Measurement: 9 cm From Medial Instep 22.5 cm 22 cm Vascular Assessment Pulses: Dorsalis Pedis Palpable: [Left:No] [Right:No] Electronic Signature(s) Signed: 09/17/2022 4:07:02 PM By: Baruch Gouty RN, [name] Entered By: Baruch Gouty on 09/17/2022 11:11:18 -------------------------------------------------------------------------------- Multi Wound Chart Details Patient Name: Date of Service: [name] [name], [name] [name]. 09/17/2022 10:15 [name] Medical Record Number: [id number] Patient Account Number: [id number] Date of Birth/Sex: Treating RN: [birth year] (67 y.o. M) Primary Care Declan Mier: [name] Other Clinician: Referring Florencia Zaccaro: Treating  Josslyn Ciolek/Extender: [name], [name] in Treatment: 22 Vital Signs Height(in): 72 Capillary Blood Glucose(mg/dl): 116 Weight(lbs): 220 Pulse(bpm): 71 Body Mass Index(BMI): 29.8 Blood Pressure(mmHg): 157/96 Temperature(F): 97.0 Respiratory Rate(breaths/min): 18 [5:Photos:] [[name]:[name]] Left, Lateral Foot Right Amputation Site - [name] Wound Location: Transmetatarsal Surgical Injury Gradually Appeared [name] Wounding Event: Diabetic Wound/Ulcer of the Lower Diabetic Wound/Ulcer of the Lower [name] Primary Etiology: Extremity Extremity Open Surgical Wound [name] [name] Secondary Etiology: Asthma, Sleep Apnea, Hypertension, Asthma, Sleep Apnea, Hypertension, [name] Comorbid History: Peripheral Venous Disease, Type II Peripheral Venous  Disease, Type II Diabetes, Osteomyelitis, Neuropathy Diabetes, Osteomyelitis, Neuropathy 12/20/2021 07/16/2022 [name] Date Acquired: 39 9 [name] Weeks of Treatment: Open Open [name] Wound Status: No No [name] Wound Recurrence: 2.5x0.2x0.3 0.4x0.3x0.5 [name] Measurements L x W x D (cm) 0.393 0.094 [name] (cm) : rea 0.118 0.047 [name] Volume (cm) : 96.10% 90.40% [name] % Reduction in [name] rea: 97.60% 95.20% [name] % Reduction in Volume: 12 Starting Position 1 (o'clock): 12 Ending Position 1 (o'clock): 0.3 Maximum Distance 1 (cm): No Yes [name] Undermining: Grade 3 Grade 2 [name] Classification: Medium Small [name] Exudate [name] mount: Serosanguineous Serosanguineous [name] Exudate TypeDAWONE, JACKO [name] ([id number].pdf Page 3 of 9 red, brown red, brown [name] Exudate Color: Epibole Distinct, outline attached [name] Wound Margin: Large (67-100%) Large (67-100%) [name] Granulation Amount: Red Red [name] Granulation Quality: Small (1-33%) None Present (0%) [name] Necrotic Amount: Fat Layer (Subcutaneous Tissue): Yes Fat Layer (Subcutaneous Tissue): Yes [name] Exposed Structures: Fascia: No Fascia: No Tendon: No Tendon: No Muscle: No Muscle:  No Joint: No Joint: No Bone: No Bone: No Large (67-100%) Small (1-33%) [name] Epithelialization: Debridement - Selective/Open Wound Debridement - Selective/Open Wound [name] Debridement: Pre-procedure Verification/Time Out 11:25 11:25 [name] Taken: Lidocaine 4% Topical Solution Lidocaine 4% Topical Solution [name] Pain Control: Slough Callus [name] Tissue Debrided: Non-Viable Tissue Skin/Epidermis [name] Level: 1.25 2 [name] Debridement [name] (sq cm): rea Curette Curette [name] Instrument: Minimum Minimum [name] Bleeding: Pressure Pressure [name] Hemostasis [name] chieved: 0 0 [name] Procedural Pain: 0 0 [name] Post Procedural Pain: Procedure was tolerated well Procedure was tolerated well [name] Debridement Treatment Response: 2.5x0.2x0.3 0.8x0.5x0.5 [name] Post Debridement Measurements L x W x D (cm) 0.118 0.157 [name] Post Debridement Volume: (cm) Callus: Yes Callus: Yes [name] Periwound Skin Texture: Excoriation: No Excoriation: No Induration: No Induration: No Crepitus: No Crepitus: No Rash: No Rash: No Scarring: No Scarring: No Maceration: No Dry/Scaly: Yes [name] Periwound Skin Moisture: Dry/Scaly: No Maceration: No Atrophie Blanche: No Atrophie Blanche: No [name] Periwound Skin Color: Cyanosis: No Cyanosis: No Ecchymosis: No Ecchymosis: No Erythema: No Erythema: No Hemosiderin Staining: No Hemosiderin Staining: No Mottled: No Mottled: No Pallor: No Pallor: No Rubor: No Rubor: No No Abnormality No Abnormality [name] Temperature: Yes [name] [name] Tenderness on Palpation: Cellular or Tissue Based Product Cellular or Tissue Based Product [name] Procedures Performed: Debridement Debridement Treatment Notes Electronic Signature(s) Signed: 09/17/2022 3:34:03 PM By: [name] DO Entered By: [name] on 09/17/2022 11:56:07 -------------------------------------------------------------------------------- Multi-Disciplinary Care Plan Details Patient Name: Date of Service: [name] [name], [name]  [name]. 09/17/2022 10:15 [name] Medical Record Number: [id number] Patient Account Number: [id number] Date of Birth/Sex: Treating RN: Nov 07, 1955 (67 y.o. [name] [name] Primary Care Klever Twyford: [name] Other Clinician: Referring Anselmo Reihl: Treating Tonesha Tsou/Extender: [name] in Treatment: 22 Multidisciplinary Care Plan reviewed with physician Active Inactive Osteomyelitis Nursing Diagnoses: Infection: osteomyelitis Goals: Diagnostic evaluation for osteomyelitis completed as ordered Date Initiated: 04/23/2022 Target Resolution Date: 10/02/2022 [name] [name], [name] [name] ([id number]) [id number].pdf Page 4 of 9 Goal Status: Active Signs and symptoms for osteomyelitis will be recognized and promptly addressed Date Initiated: 04/23/2022 Target Resolution Date: 10/03/2022 Goal Status: Active Interventions: Assess for signs and symptoms of osteomyelitis resolution every visit Provide education on osteomyelitis Screen for HBO Treatment Activities: Biopsy : 04/16/2022 Consult for HBO : 04/23/2022 Surgical debridement : 04/23/2022 Systemic antibiotics : 04/23/2022 T ordered outside of clinic : 04/23/2022 est Notes: Wound/Skin Impairment Nursing Diagnoses: Knowledge deficit related to ulceration/compromised skin integrity Goals: Patient/caregiver will verbalize understanding of skin care regimen  Date Initiated: 04/14/2022 Target Resolution Date: 10/03/2022 Goal Status: Active Interventions: Assess patient/caregiver ability to perform ulcer/skin care regimen upon admission and as needed Assess ulceration(s) every visit Provide education on ulcer and skin care Notes: Electronic Signature(s) Signed: 09/17/2022 4:07:02 PM By: Baruch Gouty RN, [name] Entered By: Baruch Gouty on 09/17/2022 11:16:30 -------------------------------------------------------------------------------- Pain Assessment Details Patient Name: Date of Service: [name] [name],  [name] [name]. 09/17/2022 10:15 [name] Medical Record Number: [id number] Patient Account Number: [id number] Date of Birth/Sex: Treating RN: [birth year] (67 y.o. [name] [name] Primary Care Unnamed Hino: [name] Other Clinician: Referring Joshalyn Ancheta: Treating Eldonna Neuenfeldt/Extender: [name], [name] in Treatment: 22 Active Problems Location of Pain Severity and Description of Pain Patient Has Paino Yes Site Locations Pain Location: Generalized Pain, Pain in Ulcers With Dressing Change: Yes Duration of the Pain. Constant / Intermittento Constant Rate the pain. Current Pain Level: 4 Worst Pain Level: 8 Least Pain Level: 0 [name] [name], [name] [name] ([id number]) [id number].pdf Page 5 of 9 Character of Pain Describe the Pain: Aching, Other: neuropathy pain Pain Management and Medication Current Pain Management: Medication: Yes Is the Current Pain Management Adequate: Adequate How does your wound impact your activities of daily livingo Sleep: No Bathing: No Appetite: No Relationship With Others: No Bladder Continence: No Emotions: Yes Bowel Continence: No Hobbies: No Toileting: No Dressing: No Electronic Signature(s) Signed: 09/17/2022 4:07:02 PM By: Baruch Gouty RN, [name] Entered By: Baruch Gouty on 09/17/2022 11:01:55 -------------------------------------------------------------------------------- Patient/Caregiver Education Details Patient Name: Date of Service: [name] [name] 3/14/2024andnbsp10:15 [name] Medical Record Number: [id number] Patient Account Number: [id number] Date of Birth/Gender: Treating RN: 19-Nov-1955 (68 y.o. [name] [name] Primary Care Physician: [name] Other Clinician: Referring Physician: Treating Physician/Extender: [name] in Treatment: 22 Education Assessment Education Provided To: Patient Education Topics Provided Elevated Blood Sugar/ Impact on Healing: Methods:  Explain/Verbal Responses: Reinforcements needed, State content correctly Hyperbaric Oxygenation: Methods: Explain/Verbal Responses: Reinforcements needed, State content correctly Offloading: Methods: Explain/Verbal Responses: Reinforcements needed, State content correctly Wound/Skin Impairment: Methods: Explain/Verbal Responses: Reinforcements needed, State content correctly Electronic Signature(s) Signed: 09/17/2022 4:07:02 PM By: Baruch Gouty RN, [name] Entered By: Baruch Gouty on 09/17/2022 11:17:07 -------------------------------------------------------------------------------- Wound Assessment Details Patient Name: Date of Service: [name] [name], [name] [name]. 09/17/2022 10:15 [name] Medical Record Number: [id number] Patient Account Number: [id number] Date of Birth/Sex: Treating RN: 12/20/1955 (66 y.o. [name] [name] Primary Care Dailan Pfalzgraf: [name] Other Clinician: Referring Ruffus Kamaka: Treating Willford Rabideau/Extender: [name], [name] in Treatment: [address] Docia Barrier [name] ([id number]) [id number] Page 6 of 9 Wound Status Wound Number: 5 Primary Diabetic Wound/Ulcer of the Lower Extremity Etiology: Wound Location: Left, Lateral Foot Secondary Open Surgical Wound Wounding Event: Surgical Injury Etiology: Date Acquired: 12/20/2021 Wound Open Weeks Of Treatment: 22 Status: Clustered Wound: No Comorbid Asthma, Sleep Apnea, Hypertension, Peripheral Venous Disease, History: Type II Diabetes, Osteomyelitis, Neuropathy Photos Wound Measurements Length: (cm) 2.5 Width: (cm) 0.2 Depth: (cm) 0.3 Area: (cm) 0.393 Volume: (cm) 0.118 % Reduction in Area: 96.1% % Reduction in Volume: 97.6% Epithelialization: Large (67-100%) Tunneling: No Undermining: No Wound Description Classification: Grade 3 Wound Margin: Epibole Exudate Amount: Medium Exudate Type: Serosanguineous Exudate Color: red, brown Foul Odor After Cleansing:  No Slough/Fibrino Yes Wound Bed Granulation Amount: Large (67-100%) Exposed Structure Granulation Quality: Red Fascia Exposed: No Necrotic Amount: Small (1-33%) Fat Layer (Subcutaneous Tissue) Exposed: Yes Necrotic Quality: Adherent Slough Tendon Exposed: No Muscle Exposed: No Joint Exposed: No Bone Exposed: No Periwound Skin Texture  Texture Color No Abnormalities Noted: No No Abnormalities Noted: Yes Callus: Yes Temperature / Pain Crepitus: No Temperature: No Abnormality Excoriation: No Tenderness on Palpation: Yes Induration: No Rash: No Scarring: No Moisture No Abnormalities Noted: No Dry / Scaly: No Maceration: No Treatment Notes Wound #5 (Foot) Wound Laterality: Left, Lateral Cleanser Wound Cleanser Discharge Instruction: Cleanse the wound with wound cleanser prior to applying [name] clean dressing using gauze sponges, not tissue or cotton balls. Peri-Wound Care Skin Prep Discharge Instruction: Use skin prep as directed [name] [name], [name] [name] ([id number]) [id number] Page 7 of 9 Topical Primary Dressing Cutimed Sorbact Swab Discharge Instruction: Apply to wound bed behind grafix Grafix Discharge Instruction: applied by Hadasah Brugger. adaptic and steri-strips Discharge Instruction: to secure grafix to wound bed. leave in place. Secondary Dressing ABD Pad, 8x10 Discharge Instruction: Apply over primary dressing as directed. Secured With [employer], 4.5x3.1 (in/yd) Discharge Instruction: Secure with Kerlix as directed. 490M Medipore H Soft Cloth Surgical T ape, 4 x 10 (in/yd) Discharge Instruction: Secure with tape as directed. Compression Wrap Compression Stockings Add-Ons Electronic Signature(s) Signed: 09/17/2022 4:07:02 PM By: Baruch Gouty RN, [name] Entered By: Baruch Gouty on 09/17/2022 11:21:06 -------------------------------------------------------------------------------- Wound Assessment Details Patient Name: Date of  Service: [name] [name], [name] [name]. 09/17/2022 10:15 [name] Medical Record Number: [id number] Patient Account Number: [id number] Date of Birth/Sex: Treating RN: 20-Jun-1956 (67 y.o. 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 Wound Open Wounding Event: Gradually Appeared Status: Date Acquired: 07/16/2022 Comorbid Asthma, Sleep Apnea, Hypertension, Peripheral Venous Disease, Weeks Of Treatment: 9 History: Type II Diabetes, Osteomyelitis, Neuropathy Clustered Wound: No Photos Wound Measurements Length: (cm) Width: (cm) Depth: (cm) Area: (cm) Volume: (cm) Tinnon, Juanantonio [name] ([id number]) 0.4 % Reduction in Area: 90.4% 0.3 % Reduction in Volume: 95.2% 0.5 Epithelialization: Small (1-33%) 0.094 Tunneling: No 0.047 Undermining: Yes Starting Position (o'clock): 12 Ending Position (o'clock): 12 Maximum Distance: (cm) 0.3 [id number] Page 8 of 9 Wound Description Classification: Grade 2 Wound Margin: Distinct, outline attached Exudate Amount: Small Exudate Type: Serosanguineous Exudate Color: red, brown Foul Odor After Cleansing: No Slough/Fibrino Yes Wound Bed Granulation Amount: Large (67-100%) Exposed Structure Granulation Quality: Red Fascia Exposed: No Necrotic Amount: None Present (0%) Fat Layer (Subcutaneous Tissue) Exposed: Yes Tendon Exposed: No Muscle Exposed: No Joint Exposed: No Bone Exposed: No Periwound Skin Texture Texture Color No Abnormalities Noted: No No Abnormalities Noted: Yes Callus: Yes Temperature / Pain Crepitus: No Temperature: No Abnormality Excoriation: No Induration: No Rash: No Scarring: No Moisture No Abnormalities Noted: No Dry / Scaly:  Yes Maceration: No Treatment Notes Wound #6 (Amputation Site - Transmetatarsal) Wound Laterality: Right Cleanser Wound Cleanser Discharge Instruction: Cleanse the wound with wound cleanser prior to applying [name] clean dressing using gauze sponges, not tissue or cotton balls. Peri-Wound Care Skin Prep Discharge Instruction: Use skin prep as directed Topical Primary Dressing Grafix Discharge Instruction: applied by Traevon Meiring. adaptic and steri-strips Discharge Instruction: to secure grafix to wound bed. leave in place. cutimed sorbact Discharge Instruction: lightly pack into wound bed behind the grafix. Secondary Dressing ABD Pad, 8x10 Discharge Instruction: Apply over primary dressing as directed. Secured With [employer], 4.5x3.1 (in/yd) Discharge Instruction: Secure with Kerlix as directed. 490M Medipore H Soft Cloth Surgical T ape, 4 x 10 (in/yd) Discharge Instruction: Secure with tape as directed.  Compression Wrap Compression Stockings Add-Ons Electronic Signature(s) Signed: 09/17/2022 4:07:02 PM By: Baruch Gouty RN, [name] Entered By: Baruch Gouty on 09/17/2022 11:21:26 [name] [name] ([id number].pdf Page 9 of 9 -------------------------------------------------------------------------------- Vitals Details Patient Name: Date of Service: [name] [name], [name] [name]. 09/17/2022 10:15 [name] Medical Record Number: [id number] Patient Account Number: [id number] Date of Birth/Sex: Treating RN: February 07, 1956 (67 y.o. [name] [name] Primary Care Caree Wolpert: [name] Other Clinician: Referring Gracin Soohoo: Treating Torion Hulgan/Extender: [name], [name] in Treatment: 22 Vital Signs Time Taken: 10:59 Temperature (F): 97.0 Height (in): 72 Pulse (bpm): 78 Weight (lbs): 220 Respiratory Rate (breaths/min): 18 Body Mass Index (BMI): 29.8 Blood Pressure (mmHg): 157/96 Capillary Blood Glucose (mg/dl): 116 Reference Range: 80 -  120 mg / dl Notes glucose post HBOT Electronic Signature(s) Signed: 09/17/2022 4:07:02 PM By: Baruch Gouty RN, [name] Entered By: Baruch Gouty on 09/17/2022 11:00:25

## 2022-09-18 NOTE — Progress Notes (Signed)
JITENDER, HANDEL [name] ([id number]) [id number] Page 1 of 2 Visit Report for 09/17/2022 Physician Orders Details Patient Name: Date of Service: [name], [name]. 09/17/2022 8:00 [name] Medical Record Number: [id number] Patient Account Number: [id number] Date of Birth/Sex: Treating RN: [birth year] (67 y.o. [name] Primary Care Provider: [name] Other Clinician: [name] Referring Provider: Treating Provider/Extender: [name] in Treatment: 22 Verbal / Phone Orders: No Diagnosis Coding Hyperbaric Oxygen Therapy 2.0 ATA for 90 Minutes without [name] Breaks - For today only. ir Wound Treatment Electronic Signature(s) Signed: 09/18/2022 3:54:15 PM By: [name] DO Signed: 09/18/2022 5:52:30 PM By: [name] RN, [name] Entered By: [name] on 09/18/2022 15:18:24 -------------------------------------------------------------------------------- SuperBill Details Patient Name: Date of Service: [name], [name]. 09/17/2022 Medical Record Number: [id number] Patient Account Number: [id number] Date of Birth/Sex: Treating RN: 10/17/1955 (67 y.o. 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 E11.621 Type 2 diabetes mellitus with foot ulcer E11.42 Type 2 diabetes mellitus with diabetic polyneuropathy S91.301A Unspecified open wound, right foot, initial encounter 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 severity Facility Procedures : CPT4 Code  Description: [id number]-(Facility Use Only) HBOT full body chamber, 54min , ICD-10 Diagnosis Description E11.621 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.522 Non-pressure chronic ulcer of other part of left foo L97.528 Non-pressure  chronic ulcer of other part of left foo severity M86.172 Other acute osteomyelitis, left ankle and foot Modifier: t with fat laye t with other sp Quantity: 5 r exposed ecified Physician Procedures : CPT4 Code Description Modifier [id number] - WC PHYS HYPERBARIC OXYGEN THERAPY ICD-10 Diagnosis Description E11.621 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.522 Non-pressure chronic ulcer of other part of left foot with fat layer L97.528  Non-pressure chronic ulcer of other part of left foot with other spe severity YOCHANAN, DEMPEWOLF [name] ([id number]) [id number] M86.172 Other acute osteomyelitis, left ankle and foot Quantity: 1 exposed cified [id number] Page 2 of 2 Electronic Signature(s) Signed: 09/17/2022 3:11:55 PM By: [name] CHT EMT BS , , Signed: 09/17/2022 3:34:03 PM By: [name] DO Entered By: [name] on 09/17/2022 15:11:54

## 2022-09-18 NOTE — Progress Notes (Signed)
NAFTALI, BIELENBERG A ([id number]) [id number] Page 1 of 2 Visit Report for 09/17/2022 Arrival Information Details Patient Name: Date of Service: [name], [name] 09/17/2022 8:00 A M Medical Record Number: [id number] Patient Account Number: [id number] Date of Birth/Sex: Treating RN: 1955/11/08 (67 y.o. [name] Primary Care Sharrieff Spratlin: [name] Other Clinician: [name] Referring Janos Shampine: Treating Chizuko Trine/Extender: [name] in Treatment: 85 Visit Information History Since Last Visit All ordered tests and consults were completed: Yes Patient Arrived: Kasandra Knudsen Added or deleted any medications: No Arrival Time: 07:35 Any new allergies or adverse reactions: No Accompanied By: self Had a fall or experienced change in No Transfer Assistance: None activities of daily living that may affect Patient Identification Verified: Yes risk of falls: Secondary Verification Process Completed: Yes Signs or symptoms of abuse/neglect since last visito No Patient Requires Transmission-Based Precautions: No Hospitalized since last visit: No Patient Has Alerts: Yes Implantable device outside of the clinic excluding No cellular tissue based products placed in the center since last visit: Pain Present Now: No Electronic Signature(s) Signed: 09/17/2022 3:14:41 PM By: [name] CHT EMT BS , , Entered By: [name] on 09/17/2022 15:14:40 -------------------------------------------------------------------------------- Encounter Discharge Information Details Patient Name: Date of Service: [name], [name] 09/17/2022 8:00 A M Medical Record Number: [id number] Patient Account Number: [id number] Date of Birth/Sex: Treating RN: September 01, 1955 (67 y.o. [name] Primary Care Jamielee Mchale: [name] Other Clinician: [name] Referring Darly Fails: Treating Allie Gerhold/Extender: [name] in Treatment: 22 Encounter Discharge Information Items Discharge Condition: Stable Ambulatory Status: Wheelchair Discharge Destination: Other (Note Required) Transportation: Other Accompanied By: staff Schedule Follow-up Appointment: No Clinical Summary of Care: Notes Patient had a wound care encounter after treatment today. Patient arrives with a cane and uses the wound care wheel chair for mobility. Electronic Signature(s) Signed: 09/17/2022 3:13:32 PM By: [name] CHT EMT BS , , Entered By: [name] on 09/17/2022 15:13:32 -------------------------------------------------------------------------------- Vitals Details Patient Name: Date of Service: [name], [name] 09/17/2022 8:00 A M Medical Record Number: [id number] Patient Account Number: [id number] Date of Birth/Sex: Treating RN: Apr 23, 1956 (67 y.o. [name] Primary Care Lavona Norsworthy: [name] Other Clinician: [name] Referring Michaelia Beilfuss: Treating Dante Roudebush/Extender: [name] in Treatment: [address] A ([id number]) [id number] Page 2 of 2 Vital Signs Time Taken: 07:41 Temperature (F): 97.2 Height (in): 72 Pulse (bpm): 90 Weight (lbs): 220 Respiratory Rate (breaths/min): 18 Body Mass Index (BMI): 29.8 Blood Pressure (mmHg): 163/80 Capillary Blood Glucose (mg/dl): 190 Reference Range: 80 - 120 mg / dl Electronic Signature(s) Signed: 09/17/2022 3:15:58 PM By: [name] CHT EMT BS , , Entered By: [name] on 09/17/2022 15:15:58

## 2022-09-18 NOTE — Progress Notes (Signed)
[name] ([id number]) [id number] Page 1 of 2 Visit Report for 09/18/2022 Arrival Information Details Patient Name: Date of Service: [name], [name] 09/18/2022 8:00 [name] Medical Record Number: [id number] Patient Account Number: [id number] Date of Birth/Sex: Treating RN: 27-Aug-1955 (67 y.o. [name] Primary Care Xeng Kucher: [name] Other Clinician: [name] Referring Sonali Wivell: Treating Willena Jeancharles/Extender: [name] in Treatment: 77 Visit Information History Since Last Visit All ordered tests and consults were completed: Yes Patient Arrived: Wheel Chair Added or deleted any medications: No Arrival Time: 07:30 Any new allergies or adverse reactions: No Accompanied By: self Had [name] fall or experienced change in No Transfer Assistance: None activities of daily living that may affect Patient Identification Verified: Yes risk of falls: Secondary Verification Process Completed: Yes Signs or symptoms of abuse/neglect since last visito No Patient Requires Transmission-Based Precautions: No Hospitalized since last visit: No Patient Has Alerts: Yes Implantable device outside of the clinic excluding No cellular tissue based products placed in the center since last visit: Pain Present Now: No Electronic Signature(s) Signed: 09/18/2022 10:57:52 AM By: [name] CHT EMT BS , , Entered By: [name] on 09/18/2022 10:57:52 -------------------------------------------------------------------------------- Encounter Discharge Information Details Patient Name: Date of Service: [name], [name] 09/18/2022 8:00 [name] Medical Record Number: [id number] Patient Account Number: [id number] Date of Birth/Sex: Treating RN: 1955/09/05 (67 y.o. [name] Primary Care Casy Tavano: [name] Other Clinician: [name] Referring Caledonia Zou: Treating Elliette Seabolt/Extender: [name] in Treatment: 22 Encounter Discharge Information Items Discharge Condition: Stable Ambulatory Status: Wheelchair Discharge Destination: Home Transportation: Private Auto Accompanied By: self Schedule Follow-up Appointment: No Clinical Summary of Care: Electronic Signature(s) Signed: 09/18/2022 11:18:11 AM By: [name] CHT EMT BS , , Entered By: [name] on 09/18/2022 11:18:10 -------------------------------------------------------------------------------- Vitals Details Patient Name: Date of Service: [name], [name] 09/18/2022 8:00 [name] Medical Record Number: [id number] Patient Account Number: [id number] Date of Birth/Sex: Treating RN: Dec 03, 1955 (67 y.o. [name] Primary Care Jaylena Holloway: [name] Other Clinician: [name] Referring Rayni Nemitz: Treating Torian Thoennes/Extender: [name], [name] in Treatment: 22 Vital Signs Time Taken: 07:42 Temperature (F): 98.1 [name], GAUNCE [name] ([id number]) [id number] Page 2 of 2 Height (in): 72 Pulse (bpm): 87 Weight (lbs): 220 Respiratory Rate (breaths/min): 18 Body Mass Index (BMI): 29.8 Blood Pressure (mmHg): 116/71 Capillary Blood Glucose (mg/dl): 226 Reference Range: 80 - 120 mg / dl Electronic Signature(s) Signed: 09/18/2022 10:58:49 AM By: [name] CHT EMT BS , , Entered By: [name] on 09/18/2022 10:58:49

## 2022-09-21 ENCOUNTER — Encounter (HOSPITAL_BASED_OUTPATIENT_CLINIC_OR_DEPARTMENT_OTHER): Payer: Medicare HMO | Admitting: Internal Medicine

## 2022-09-21 DIAGNOSIS — M86172 Other acute osteomyelitis, left ankle and foot: Secondary | ICD-10-CM

## 2022-09-21 DIAGNOSIS — L97528 Non-pressure chronic ulcer of other part of left foot with other specified severity: Secondary | ICD-10-CM | POA: Diagnosis not present

## 2022-09-21 DIAGNOSIS — L97522 Non-pressure chronic ulcer of other part of left foot with fat layer exposed: Secondary | ICD-10-CM

## 2022-09-21 DIAGNOSIS — E11621 Type 2 diabetes mellitus with foot ulcer: Secondary | ICD-10-CM | POA: Diagnosis not present

## 2022-09-21 LAB — GLUCOSE, CAPILLARY
Glucose-Capillary: 202 mg/dL — ABNORMAL HIGH (ref 70–99)
Glucose-Capillary: 89 mg/dL (ref 70–99)

## 2022-09-21 NOTE — Progress Notes (Addendum)
REGNALD, SZCZEPANSKI A ([id number]) [id number] Page 1 of 2 Visit Report for 09/21/2022 Arrival Information Details Patient Name: Date of Service: [name], [name] 09/21/2022 8:00 A M Medical Record Number: [id number] Patient Account Number: [id number] Date of Birth/Sex: Treating RN: 11-Jul-1955 (67 y.o. [name] Primary Care Shaheer Bonfield: [name] Other Clinician: [name] Referring Soham Hollett: Treating Deja Pisarski/Extender: [name] in Treatment: 51 Visit Information History Since Last Visit All ordered tests and consults were completed: Yes Patient Arrived: Wheel Chair Added or deleted any medications: No Arrival Time: 07:30 Any new allergies or adverse reactions: No Accompanied By: self Had a fall or experienced change in No Transfer Assistance: None activities of daily living that may affect Patient Identification Verified: Yes risk of falls: Secondary Verification Process Completed: Yes Signs or symptoms of abuse/neglect since last visito No Patient Requires Transmission-Based Precautions: No Hospitalized since last visit: No Patient Has Alerts: Yes Implantable device outside of the clinic excluding No cellular tissue based products placed in the center since last visit: Pain Present Now: No Electronic Signature(s) Signed: 09/21/2022 10:07:27 AM By: [name] CHT EMT BS , , Entered By: [name] on 09/21/2022 09:51:07 -------------------------------------------------------------------------------- Encounter Discharge Information Details Patient Name: Date of Service: [name], [name] 09/21/2022 8:00 A M Medical Record Number: [id number] Patient Account Number: [id number] Date of Birth/Sex: Treating RN: 09-18-1955 (67 y.o. [name] Primary Care Kaveon Blatz: [name] Other Clinician: [name] Referring Angella Montas: Treating Tirzah Fross/Extender: [name] in Treatment: 22 Encounter Discharge Information Items Discharge Condition: Stable Ambulatory Status: Wheelchair Discharge Destination: Home Transportation: Private Auto Accompanied By: self Schedule Follow-up Appointment: No Clinical Summary of Care: Electronic Signature(s) Signed: 09/21/2022 1:40:32 PM By: [name] CHT EMT BS , , Entered By: [name] on 09/21/2022 12:05:19 -------------------------------------------------------------------------------- [name] Details Patient Name: Date of Service: [name], [name] 09/21/2022 8:00 A M Medical Record Number: [id number] Patient Account Number: [id number] Date of Birth/Sex: Treating RN: 1955-07-12 (67 y.o. [name] Primary Care Cashius Grandstaff: [name] Other Clinician: [name] Referring Sanaii Caporaso: Treating Antonela Freiman/Extender: [name], [name] in Treatment: 22 Vital Signs Time Taken: 08:16 Temperature (F): 98.2 [name], [name] A ([id number]) [id number] Page 2 of 2 Height (in): 72 Pulse (bpm): 94 Weight (lbs): 220 Respiratory Rate (breaths/min): 18 Body Mass Index (BMI): 29.8 Blood Pressure (mmHg): 126/74 Capillary Blood Glucose (mg/dl): 202 Reference Range: 80 - 120 mg / dl Electronic Signature(s) Signed: 09/21/2022 10:07:27 AM By: [name] CHT EMT BS , , Entered By: [name] on 09/21/2022 09:59:51

## 2022-09-21 NOTE — Progress Notes (Signed)
GREGORI, OKELLEY A ([id number]) [id number] Page 1 of 1 Visit Report for 09/21/2022 SuperBill Details Patient Name: Date of Service: [name], [name] 09/21/2022 Medical Record Number: [id number] Patient Account Number: [id number] Date of Birth/Sex: Treating RN: 31-Dec-1955 (66 y.o. [name], [name] Primary Care Provider: [name] Other Clinician: [name] Referring Provider: Treating Provider/Extender: [name] in Treatment: 22 Diagnosis Coding ICD-10 Codes Code Description [phone] Non-pressure chronic ulcer of other part of left foot with fat layer exposed M86.172 Other acute osteomyelitis, left ankle and foot L97.528 Non-pressure chronic ulcer of other part of left foot with other specified severity E11.621 Type 2 diabetes mellitus with foot ulcer E11.42 Type 2 diabetes mellitus with diabetic polyneuropathy S91.301A Unspecified open wound, right foot, initial encounter 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 severity Facility Procedures CPT4 Code Description Modifier Quantity [id number]-(Facility Use Only) HBOT full body chamber, 63min , 5 ICD-10 Diagnosis Description E11.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 M86.172 Other acute osteomyelitis, left ankle and foot Physician Procedures Quantity CPT4 Code Description Modifier [id number] - WC PHYS HYPERBARIC OXYGEN THERAPY 1 ICD-10 Diagnosis Description E11.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

## 2022-09-21 NOTE — Progress Notes (Addendum)
[name] ([id number]) [id number] Page 1 of 2 Visit Report for 09/21/2022 HBO Details Patient Name: Date of Service: [name], [name] 09/21/2022 8:00 [name] Medical Record Number: [id number] Patient Account Number: [id number] Date of Birth/Sex: Treating RN: Sep 16, 1955 (67 y.o. [name], [name] Primary Care Akshara Blumenthal: [name] Other Clinician: [name] Referring Gil Ingwersen: Treating Olivia Pavelko/Extender: [name] in Treatment: 22 HBO Treatment Course Details Treatment Course Number: 1 Ordering Roshard Rezabek: [name] T Treatments Ordered: otal 80 HBO Treatment Start Date: 06/01/2022 HBO Indication: Diabetic Ulcer(s) of the Lower Extremity Standard/Conservative Wound Care tried and failed greater than or equal to 30 days HBO Treatment Details Treatment Number: 67 Patient Type: Outpatient Chamber Type: Monoplace Chamber Serial #: [id number] Treatment Protocol: 2.0 ATA with 90 minutes oxygen, and no air breaks Treatment Details Compression Rate Down: 1.0 psi / minute De-Compression Rate Up: 1.0 psi / minute Air breaks and breathing Decompress Decompress Compress Tx Pressure Begins Reached periods Begins Ends (leave unused spaces blank) Chamber Pressure (ATA 1 2 ------2 1 ) Clock Time (24 hr) 08:19 08:55 - - - - - - 10:25 10:43 Treatment Length: 144 (minutes) Treatment Segments: 5 Vital Signs Capillary Blood Glucose Reference Range: 80 - 120 mg / dl HBO Diabetic Blood Glucose Intervention Range: <131 mg/dl or >249 mg/dl Type: Time Vitals Blood Pulse: Respiratory Temperature: Capillary Blood Glucose Pulse Action Taken: Pressure: Rate: Glucose (mg/dl): Meter #: Oximetry (%) Taken: Pre 08:16 126/74 94 18 98.2 202 1 none per protocol Post 10:46 148/88 80 18 97.1 89 1 8 oz Glucerna Shake given, wait 15 minutes Treatment Response Treatment Toleration: Fair Treatment Completion Status: Treatment Completed  without Adverse Event Treatment Notes Patient arrived, blood glucose measured 202 mg/dL. [name] stated that he ate cream of wheat and [place] spam today for breakfast. He prepared for treatment. All other vital signs were within normal range. After performing safety check, patient was placed in the chamber which was compressed at 1 psi/min, highest flow rate. Patient experienced some issues today equalizing sinuses. Patient stated that he wanted to attempt the treatment. Chamber was compressed 1 psi at [name] time allowing for time to equalize sinuses. After the third time patient having issues, physician was made aware of the problem and how patient can slowly equalize pressure in his sinuses. Patient was at 10 psig and wanted to continue. Dr. Heber [place] allowed patient to undergo treatment at 2.0 ATA x 90 minutes without air breaks for today. Patient tolerated treatment and subsequent decompression at 1 psi/min with no issues. Patient denied any issues with ear equalization and stated that his sinuses felt fine. Post-treatment blood glucose level was 89 mg/dL. [name] was given 8 oz Glucerna and, per protocol, waited 15 minutes prior to departing. Patient was stable upon discharge. Additional Procedure Documentation Tissue Sevierity: Necrosis of bone Physician HBO Attestation: I certify that I supervised this HBO treatment in accordance with Medicare guidelines. [name] trained emergency response team is readily available per Yes hospital policies and procedures. Continue HBOT as ordered. Yes Electronic Signature(s) [name], BARROWMAN [name] ([id number]) [id number] Page 2 of 2 Signed: 09/21/2022 4:26:30 PM By: [name] DO Previous Signature: 09/21/2022 1:41:38 PM Version By: [name] CHT EMT BS , , Previous Signature: 09/21/2022 12:10:44 PM Version By: [name] DO Previous Signature: 09/21/2022 1:40:32 PM Version By: [name] CHT EMT BS ,  , Entered By: [name] on 09/21/2022 16:24:15 -------------------------------------------------------------------------------- HBO Safety Checklist Details Patient Name: Date of  Service: [name], [name] 09/21/2022 8:00 [name] Medical Record Number: [id number] Patient Account Number: [id number] Date of Birth/Sex: Treating RN: 09/09/1955 (67 y.o. [name], [name] Primary Care Tawona Filsinger: [name] Other Clinician: [name] Referring Rivers Hamrick: Treating Marizol Borror/Extender: [name], [name] in Treatment: 22 HBO Safety Checklist Items Safety Checklist Consent Form Signed Patient voided / foley secured and emptied When did you last eato 0500 Last dose of injectable or oral agent 0540 Ostomy pouch emptied and vented if applicable NA All implantable devices assessed, documented and approved NA Intravenous access site secured and place NA Valuables secured Linens and cotton and cotton/polyester blend (less than 51% polyester) Personal oil-based products / skin lotions / body lotions removed Wigs or hairpieces removed NA Smoking or tobacco materials removed NA Books / newspapers / magazines / loose paper removed Cologne, aftershave, perfume and deodorant removed Jewelry removed (may wrap wedding band) Make-up removed Hair care products removed Battery operated devices (external) removed Heating patches and chemical warmers removed Titanium eyewear removed Nail polish cured greater than 10 hours Casting material cured greater than 10 hours NA Hearing aids removed NA Loose dentures or partials removed dentures removed Prosthetics have been removed NA Patient demonstrates correct use of air break device (if applicable) Patient concerns have been addressed Patient grounding bracelet on and cord attached to chamber Specifics for Inpatients (complete in addition to above) Medication sheet sent with patient NA Intravenous medications needed  or due during therapy sent with patient NA Drainage tubes (e.g. nasogastric tube or chest tube secured and vented) NA Endotracheal or Tracheotomy tube secured NA Cuff deflated of air and inflated with saline NA Airway suctioned NA Notes Paper version used prior to treatment start. Electronic Signature(s) Signed: 09/21/2022 10:07:27 AM By: [name] CHT EMT BS , , Entered By: [name] on 09/21/2022 10:01:57

## 2022-09-22 ENCOUNTER — Encounter (HOSPITAL_BASED_OUTPATIENT_CLINIC_OR_DEPARTMENT_OTHER): Payer: Medicare HMO | Admitting: Internal Medicine

## 2022-09-22 DIAGNOSIS — L97528 Non-pressure chronic ulcer of other part of left foot with other specified severity: Secondary | ICD-10-CM | POA: Diagnosis not present

## 2022-09-22 DIAGNOSIS — E11621 Type 2 diabetes mellitus with foot ulcer: Secondary | ICD-10-CM

## 2022-09-22 DIAGNOSIS — M86172 Other acute osteomyelitis, left ankle and foot: Secondary | ICD-10-CM

## 2022-09-22 DIAGNOSIS — L97522 Non-pressure chronic ulcer of other part of left foot with fat layer exposed: Secondary | ICD-10-CM

## 2022-09-22 LAB — GLUCOSE, CAPILLARY
Glucose-Capillary: 306 mg/dL — ABNORMAL HIGH (ref 70–99)
Glucose-Capillary: 247 mg/dL — ABNORMAL HIGH (ref 70–99)

## 2022-09-23 ENCOUNTER — Encounter (HOSPITAL_BASED_OUTPATIENT_CLINIC_OR_DEPARTMENT_OTHER): Payer: Medicare HMO | Admitting: Physician Assistant

## 2022-09-23 DIAGNOSIS — E11621 Type 2 diabetes mellitus with foot ulcer: Secondary | ICD-10-CM | POA: Diagnosis not present

## 2022-09-23 LAB — GLUCOSE, CAPILLARY
Glucose-Capillary: 237 mg/dL — ABNORMAL HIGH (ref 70–99)
Glucose-Capillary: 191 mg/dL — ABNORMAL HIGH (ref 70–99)

## 2022-09-23 NOTE — Progress Notes (Signed)
HISTORY AND PHYSICAL     CC:  follow up. Requesting Provider:  Dierdre Searles, PA-C  HPI: This is a 67 y.o. male who is here today for follow up for PAD and CLI.    Vascular Hx: -Aortogram with drug coated balloon angioplasty right SFA 09/10/2021 Dr. Virl Cagey -right below knee popliteal to PTA BPG with GSV and 4th metatarsal amputation 09/15/2021 Dr. Virl Cagey -aortogram with balloon angioplasty with chocolate balloon of retained valves 12/03/2021 Dr. Virl Cagey -aortogram with laser atherectomy left PTA and balloon angioplasty, stent left SFA 12/08/2021 Dr. Donzetta Matters -LLE angiogram 12/17/2021 Dr. Virl Cagey -aortogram with drug eluting stent of right SFA x 2 09/09/2022 Dr. Virl Cagey  Pt has hx of right TMA by Dr. Blenda Mounts 09/19/2021, amputation left great toe and 5th toe 12/18/2021 Dr. Posey Pronto, left lateral foot excisional debridement with washout 12/20/2021 Dr. Posey Pronto.  When he was evaluated by Dr. Virl Cagey on 3/6, the pt felt his wounds were improving with hyperbaric therapy 5 days a week to a depth of roughly 50 feet and felt his left sided TMA wound was healing well.  He felt that if the would was not healing at the next visit, pt would need arteriogram with attempt to recanalize the PTA on the right.   The pt returns today for follow up.  He states he is getting hyperbaric therapy everyday and feels his wound is getting better.  He states they are using placenta on the wound.  He does not have frank rest pain, but state he get neuropathic shooting pains in his feet.  He does not really walk enough to elicit claudication.    The pt is on a statin for cholesterol management.    The pt is not on an aspirin.    Other AC:  Plavix The pt is on CCB, BB, ACEI for hypertension.  The pt does  have diabetes. Tobacco hx:  former    Past Medical History:  Diagnosis Date   Diabetes mellitus with peripheral vascular disease    Hypertension    Neuropathy    PTSD (post-traumatic stress disorder)    Sleep apnea     Past  Surgical History:  Procedure Laterality Date   ABDOMINAL AORTOGRAM W/LOWER EXTREMITY Right 09/10/2021   Procedure: ABDOMINAL AORTOGRAM W/LOWER EXTREMITY;  Surgeon: [name], MD;  Location: [facility];  Service: Cardiovascular;  Laterality: Right;   ABDOMINAL AORTOGRAM W/LOWER EXTREMITY N/A 12/17/2021   Procedure: ABDOMINAL AORTOGRAM W/LOWER EXTREMITY;  Surgeon: [name], MD;  Location: [facility];  Service: Cardiovascular;  Laterality: N/A;   ABDOMINAL AORTOGRAM W/LOWER EXTREMITY N/A 12/03/2021   Procedure: ABDOMINAL AORTOGRAM W/LOWER EXTREMITY;  Surgeon: [name], MD;  Location: [facility];  Service: Cardiovascular;  Laterality: N/A;   ABDOMINAL AORTOGRAM W/LOWER EXTREMITY N/A 09/09/2022   Procedure: ABDOMINAL AORTOGRAM W/LOWER EXTREMITY;  Surgeon: [name], MD;  Location: [facility];  Service: Cardiovascular;  Laterality: N/A;   ABDOMINAL HERNIA REPAIR     AMPUTATION Right 09/19/2021   Procedure: TRANSMETATARSAL AMPUTATION FOOT;  Surgeon: Lorenda Peck, MD;  Location: [facility];  Service: Podiatry;  Laterality: Right;  Surgical team to do local block   AMPUTATION Left 12/20/2021   Procedure: LEFT FOOT REVISION PARTIAL FIRST RAY AMPUTATION WITH PLACEMENT OF PRESSURE WOUND VAC;  Surgeon: [name], DPM;  Location: [place];  Service: Podiatry;  Laterality: Left;   AMPUTATION TOE Right 09/15/2021   Procedure: AMPUTATION 4th TOE;  Surgeon: Orlie Pollen  E, MD;  Location: [place];  Service: Vascular;  Laterality: Right;   AMPUTATION TOE Left 12/18/2021   Procedure: AMPUTATION LEFT BIG TOE; LEFT 5TH RAY PARTIAL AMPUTATION;  Surgeon: [name], DPM;  Location: [place];  Service: Podiatry;  Laterality: Left;   BACK SURGERY     CORONARY ATHERECTOMY N/A 12/08/2021   Procedure: CORONARY ATHERECTOMY;  Surgeon: Waynetta Sandy, MD;  Location: [facility];  Service: Cardiovascular;  Laterality: N/A;  Laser - Lt. PT   FEMORAL-TIBIAL  BYPASS GRAFT Right 09/15/2021   Procedure: BYPASS GRAFT BELOW KNEE POPLITEAL ARTERY TO POSTERIOR TIBIAL ARTERY;  Surgeon: [name], MD;  Location: [place];  Service: Vascular;  Laterality: Right;   IRRIGATION AND DEBRIDEMENT FOOT Right 09/17/2021   Procedure: IRRIGATION AND DEBRIDEMENT FOOT;  Surgeon: Lorenda Peck, MD;  Location: [place];  Service: Podiatry;  Laterality: Right;  Suregeon will do anesthesia block   KNEE SURGERY     LOWER EXTREMITY ANGIOGRAPHY Left 12/08/2021   Procedure: Lower Extremity Angiography;  Surgeon: Waynetta Sandy, MD;  Location: [facility];  Service: Cardiovascular;  Laterality: Left;   PERIPHERAL VASCULAR BALLOON ANGIOPLASTY  09/10/2021   Procedure: PERIPHERAL VASCULAR BALLOON ANGIOPLASTY;  Surgeon: [name], MD;  Location: [facility];  Service: Cardiovascular;;  rt sfa pta   PERIPHERAL VASCULAR BALLOON ANGIOPLASTY Right 12/03/2021   Procedure: PERIPHERAL VASCULAR BALLOON ANGIOPLASTY;  Surgeon: [name], MD;  Location: [facility];  Service: Cardiovascular;  Laterality: Right;   PERIPHERAL VASCULAR INTERVENTION Left 12/08/2021   Procedure: PERIPHERAL VASCULAR INTERVENTION;  Surgeon: Waynetta Sandy, MD;  Location: [facility];  Service: Cardiovascular;  Laterality: Left;  LT. SFA   PERIPHERAL VASCULAR INTERVENTION  09/09/2022   Procedure: PERIPHERAL VASCULAR INTERVENTION;  Surgeon: [name], MD;  Location: [facility];  Service: Cardiovascular;;   SHOULDER SURGERY     VEIN HARVEST Right 09/15/2021   Procedure: GREATER SAPHENOUS VEIN HARVEST;  Surgeon: [name], MD;  Location: [place];  Service: Vascular;  Laterality: Right;    Allergies  Allergen Reactions   Gemfibrozil Other (See Comments)    Doesn't know its been awhile   Oxycontin [Oxycodone Hcl] Other (See Comments)    Bad reaction "took me to a place I never wanna go again"   Pork-Derived Products    Simvastatin Other (See  Comments)    Says he doesn't know its been awhile    Current Outpatient Medications  Medication Sig Dispense Refill   albuterol (VENTOLIN HFA) 108 (90 Base) MCG/ACT inhaler Inhale 2 puffs into the lungs every 6 (six) hours as needed for wheezing or shortness of breath. 8 g 0   amLODipine (NORVASC) 5 MG tablet Take 1 tablet (5 mg total) by mouth every morning. 30 tablet 0   atorvastatin (LIPITOR) 80 MG tablet Take 1 tablet (80 mg total) by mouth at bedtime. 30 tablet 0   benzonatate (TESSALON) 100 MG capsule Take 1 capsule (100 mg total) by mouth every 8 (eight) hours. 28 capsule 0   buPROPion (WELLBUTRIN XL) 300 MG 24 hr tablet Take 1 tablet (300 mg total) by mouth every morning. 30 tablet 0   carboxymethylcellulose (REFRESH PLUS) 0.5 % SOLN Place 2 drops into both eyes in the morning, at noon, in the evening, and at bedtime.     cholecalciferol (VITAMIN D) 25 MCG (1000 UNIT) tablet Take 1,000 Units by mouth daily.  clopidogrel (PLAVIX) 75 MG tablet Take 1 tablet (75 mg total) by mouth daily. 30 tablet 11   empagliflozin (JARDIANCE) 25 MG TABS tablet Take 1 tablet (25 mg total) by mouth daily. 30 tablet 0   fluticasone furoate-vilanterol (BREO ELLIPTA) 100-25 MCG/INH AEPB Inhale 1 puff into the lungs daily. (Patient not taking: Reported on 09/08/2022) 60 each 5   gabapentin (NEURONTIN) 300 MG capsule Take 600 mg by mouth 2 (two) times daily as needed (pain).     insulin aspart (NOVOLOG) 100 UNIT/ML injection CBG 70 - 120: 0 units CBG 121 - 150: 1 unit CBG 151 - 200: 2 units CBG 201 - 250: 3 units CBG 251 - 300: 5 units CBG 301 - 350: 7 units CBG 351 - 400: 9 units (Patient not taking: Reported on 09/08/2022) 10 mL 11   insulin aspart protamine - aspart (NOVOLOG 70/30 MIX) (70-30) 100 UNIT/ML FlexPen Inject 40-80 Units into the skin See admin instructions. Take 80 units in the morning and 40 units at bedtime     insulin glargine-yfgn (SEMGLEE) 100 UNIT/ML injection Inject 0.2 mLs (20 Units total)  into the skin 2 (two) times daily. (Patient not taking: Reported on 09/08/2022) 10 mL 11   lisinopril (ZESTRIL) 20 MG tablet Take 20 mg by mouth daily.     lurasidone (LATUDA) 40 MG TABS tablet Take 1 tablet (40 mg total) by mouth daily with supper. 30 tablet 0   meclizine (ANTIVERT) 25 MG tablet Take 1 tablet (25 mg total) by mouth 3 (three) times daily as needed for dizziness. 30 tablet 0   metoprolol tartrate (LOPRESSOR) 50 MG tablet Take 1 tablet (50 mg total) by mouth 2 (two) times daily. 60 tablet 0   OLANZapine (ZYPREXA) 2.5 MG tablet Take 1 tablet (2.5 mg total) by mouth at bedtime. 30 tablet 0   omeprazole (PRILOSEC) 20 MG capsule Take 1 capsule (20 mg total) by mouth at bedtime. 30 capsule 0   OXcarbazepine (TRILEPTAL) 150 MG tablet Take 1 tablet (150 mg total) by mouth 2 (two) times daily. 60 tablet 0   oxyCODONE (ROXICODONE) 15 MG immediate release tablet Take 15 mg by mouth every 6 (six) hours as needed (pain).     senna-docusate (SENOKOT-S) 8.6-50 MG tablet Take 1 tablet by mouth at bedtime as needed for mild constipation.     No current facility-administered medications for this visit.    Family History  Problem Relation Age of Onset   Stroke Mother    Coronary artery disease Father    COPD Brother     Social History   Socioeconomic History   Marital status: Married    Spouse name: Not on file   Number of children: Not on file   Years of education: Not on file   Highest education level: Not on file  Occupational History   Not on file  Tobacco Use   Smoking status: Former    Types: Cigarettes    Passive exposure: Never   Smokeless tobacco: Never  Vaping Use   Vaping Use: Never used  Substance and Sexual Activity   Alcohol use: No   Drug use: Never   Sexual activity: Not on file  Other Topics Concern   Not on file  Social History Narrative   Not on file   Social Determinants of Health   Financial Resource Strain: Not on file  Food Insecurity: Not on file   Transportation Needs: Not on file  Physical Activity: Not on file  Stress:  Not on file  Social Connections: Not on file  Intimate Partner Violence: Not on file     REVIEW OF SYSTEMS:   [X]  denotes positive finding, [ ]  denotes negative finding Cardiac  Comments:  Chest pain or chest pressure:    Shortness of breath upon exertion:    Short of breath when lying flat:    Irregular heart rhythm:        Vascular    Pain in calf, thigh, or hip brought on by ambulation:    Pain in feet at night that wakes you up from your sleep:  x See HPI  Blood clot in your veins:    Leg swelling:         Pulmonary    Oxygen at home:    Productive cough:     Wheezing:         Neurologic    Sudden weakness in arms or legs:     Sudden numbness in arms or legs:     Sudden onset of difficulty speaking or slurred speech:    Temporary loss of vision in one eye:     Problems with dizziness:         Gastrointestinal    Blood in stool:     Vomited blood:         Genitourinary    Burning when urinating:     Blood in urine:        Psychiatric    Major depression:         Hematologic    Bleeding problems:    Problems with blood clotting too easily:        Skin    Rashes or ulcers: x       Constitutional    Fever or chills:      PHYSICAL EXAMINATION:  Today's Vitals   09/25/22 0857  BP: (!) 159/94  Pulse: 97  Resp: 20  Temp: 97.6 F (36.4 C)  TempSrc: Temporal  SpO2: 97%  Weight: 240 lb (108.9 kg)  Height: 6' (1.829 m)  PainSc: 2    Body mass index is 32.55 kg/m.   General:  WDWN in NAD; vital signs documented above Gait: Not observed HENT: WNL, normocephalic Pulmonary: normal non-labored breathing , without wheezing Cardiac: regular HR, without carotid bruits Abdomen: obese Skin: without rashes Vascular Exam/Pulses: Brisk doppler flow right DP/PT Brisk doppler flow left DP/Pero/monophasic PT Extremities:  Right plantar foot  Left lateral  foot   Musculoskeletal: no muscle wasting or atrophy  Neurologic: A&O X 3 Psychiatric:  The pt has Normal affect.   Non-Invasive Vascular Imaging:   Previous ABI's/TBI's on 09/04/2022: Right:  0.98/na  Left:  0.62/na   Previous arterial duplex on 09/04/2022 (prior to intervention on 3/6): Summary:  Right: 75-99% stenosis noted in the superficial femoral artery. Patent  bypass graft , limited visualization of the distal anastomosis and outflow. Further imaging may be waranted.   Left: Patent Stent with velocity in the proximal segment in the 50-99%  stenosis range.     ASSESSMENT/PLAN:: 67 y.o. male here for follow up for PAD with hx of: -Aortogram with drug coated balloon angioplasty right SFA 09/10/2021 Dr. Virl Cagey -right below knee popliteal to PTA BPG with GSV and 4th metatarsal amputation 09/15/2021 Dr. Virl Cagey -aortogram with balloon angioplasty with chocolate balloon of retained valves 12/03/2021 Dr. Virl Cagey -aortogram with laser atherectomy left PTA and balloon angioplasty, stent left SFA 12/08/2021 Dr. Donzetta Matters -LLE angiogram 12/17/2021 Dr. Virl Cagey -aortogram with drug eluting  stent of right SFA x 2 09/09/2022 Dr. Virl Cagey   -pt returns today for wound check and seen with Dr. Virl Cagey.  Left foot wound improving with hyperbaric therapy.  He does have +doppler flow right DP/PT/pero and left DP/pero and dampened PT.  Given the wounds are improving, pt will return in one month with BLE arterial duplex and ABI and see Dr. Virl Cagey.  If any issues before then, he should call and we can schedule him for arteriogram with Dr. Virl Cagey.   -continue asa/plavix/statin-he will call his pharmacy for statin refill.  -also discussed good control of DM will also help with wound healing.   Leontine Locket, [facility] [phone]  Clinic MD:   Virl Cagey

## 2022-09-24 ENCOUNTER — Encounter (HOSPITAL_BASED_OUTPATIENT_CLINIC_OR_DEPARTMENT_OTHER): Payer: Medicare HMO | Admitting: Internal Medicine

## 2022-09-24 DIAGNOSIS — L97522 Non-pressure chronic ulcer of other part of left foot with fat layer exposed: Secondary | ICD-10-CM

## 2022-09-24 DIAGNOSIS — L97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 severity: Secondary | ICD-10-CM | POA: Diagnosis not present

## 2022-09-24 DIAGNOSIS — E11621 Type 2 diabetes mellitus with foot ulcer: Secondary | ICD-10-CM

## 2022-09-24 DIAGNOSIS — M86172 Other acute osteomyelitis, left ankle and foot: Secondary | ICD-10-CM

## 2022-09-24 DIAGNOSIS — L97528 Non-pressure chronic ulcer of other part of left foot with other specified severity: Secondary | ICD-10-CM | POA: Diagnosis not present

## 2022-09-24 LAB — GLUCOSE, CAPILLARY
Glucose-Capillary: 184 mg/dL — ABNORMAL HIGH (ref 70–99)
Glucose-Capillary: 145 mg/dL — ABNORMAL HIGH (ref 70–99)

## 2022-09-24 NOTE — Progress Notes (Signed)
JONMICHAEL, BELTRAME A ([id number]) [id number] Page 1 of 1 Visit Report for 09/22/2022 SuperBill Details Patient Name: Date of Service: [name], [name] 09/22/2022 Medical Record Number: [id number] Patient Account Number: [id number] Date of Birth/Sex: Treating RN: [birth year] (67 y.o. [name] Primary Care Provider: [name] Other Clinician: [name] Referring Provider: Treating Provider/Extender: [name] in Treatment: 23 Diagnosis Coding ICD-10 Codes Code Description [phone] Non-pressure chronic ulcer of other part of left foot with fat layer exposed M86.172 Other acute osteomyelitis, left ankle and foot L97.528 Non-pressure chronic ulcer of other part of left foot with other specified severity E11.621 Type 2 diabetes mellitus with foot ulcer E11.42 Type 2 diabetes mellitus with diabetic polyneuropathy S91.301A Unspecified open wound, right foot, initial encounter 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 severity Facility Procedures CPT4 Code Description Modifier Quantity [id number] G0277-(Facility Use Only) HBOT full body chamber, 89min , 4 ICD-10 Diagnosis Description E11.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 M86.172 Other acute osteomyelitis, left ankle and foot Physician Procedures Quantity CPT4 Code Description Modifier [id number] - WC PHYS HYPERBARIC OXYGEN THERAPY 1 ICD-10 Diagnosis Description E11.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 M86.172 Other acute osteomyelitis, left ankle and  foot Electronic Signature(s) Signed: 09/22/2022 5:03:25 PM By: [name] DO Signed: 09/23/2022 3:53:40 PM By: [name] CHT EMT BS , , Entered By: [name] on 09/22/2022 13:44:48

## 2022-09-24 NOTE — Progress Notes (Signed)
[name] ([id number]) [id number] Page 1 of 2 Visit Report for 09/22/2022 HBO Details Patient Name: Date of Service: [name], [name] 09/22/2022 8:00 [name] Medical Record Number: [id number] Patient Account Number: [id number] Date of Birth/Sex: Treating RN: 1956/03/25 (67 y.o. [name] Primary Care Darek Eifler: [name] Other Clinician: [name] Referring Shelsy Seng: Treating Shane Badeaux/Extender: [name] in Treatment: 23 HBO Treatment Course Details Treatment Course Number: 1 Ordering Marika Mahaffy: [name] T Treatments Ordered: otal 80 HBO Treatment Start Date: 06/01/2022 HBO Indication: Diabetic Ulcer(s) of the Lower Extremity Standard/Conservative Wound Care tried and failed greater than or equal to 30 days HBO Treatment Details Treatment Number: 68 Patient Type: Outpatient Chamber Type: Monoplace Chamber Serial #: [id number] Treatment Protocol: 2.0 ATA with 90 minutes oxygen, and no air breaks Treatment Details Compression Rate Down: 1.5 psi / minute De-Compression Rate Up: 1.5 psi / minute [name] breaks and breathing ir Compress Tx Pressure periods Decompress Decompress Begins Reached (leave unused spaces Begins Ends blank) Chamber Pressure (ATA 1 2 - - - - --2 1 ) Clock Time (24 hr) 08:45 09:01 09:31 09:36 10:06 10:11 - - 10:41 10:56 Treatment Length: 131 (minutes) Treatment Segments: 4 Vital Signs Capillary Blood Glucose Reference Range: 80 - 120 mg / dl HBO Diabetic Blood Glucose Intervention Range: <131 mg/dl or >249 mg/dl Type: Time Vitals Blood Pulse: Respiratory Temperature: Capillary Blood Glucose Pulse Action Taken: Pressure: Rate: Glucose (mg/dl): Meter #: Oximetry (%) Taken: Pre 08:13 172/82 91 18 98 306 1 Asymptomatic for hyperglycemia Post 11:03 142/111 80 18 97.3 247 1 manual BP taken Post 11:05 168/92 none per protocol Treatment Response Treatment Toleration: Well Treatment  Completion Status: Treatment Completed without Adverse Event Treatment Notes Patient arrived, blood glucose measured. 306 mg/dL. Patient was asymptomatic for hyperglycemia. After safety check, patient was placed in the chamber which was compressed at 1.5 psi/min. Patient denied any issues with ear equalization and/or pain today. He tolerated treatment and subsequent decompression at 1.5 psi/min. Patient's post treatment blood pressure as measured by machine was higher than 100 mmHg. [name] manual BP was taken resulting at 168/92 mmHg. Post-treatment glucose level was 247 mg/dL. Patient was stable upon discharge and was riding with his spouse. Additional Procedure Documentation Tissue Sevierity: Necrosis of bone Physician HBO Attestation: I certify that I supervised this HBO treatment in accordance with Medicare guidelines. [name] trained emergency response team is readily available per Yes hospital policies and procedures. Continue HBOT as ordered. Yes Electronic Signature(s) Signed: 09/22/2022 5:03:25 PM By: [name] DO Entered By: [name] on 09/22/2022 17:00:15 [name] Jefferson [name] ([id number]) [id number] Page 2 of 2 -------------------------------------------------------------------------------- HBO Safety Checklist Details Patient Name: Date of Service: [name], [name] 09/22/2022 8:00 [name] Medical Record Number: [id number] Patient Account Number: [id number] Date of Birth/Sex: Treating RN: 11/05/1955 (67 y.o. [name] Primary Care Rohan Juenger: [name] Other Clinician: [name] Referring Shalene Gallen: Treating Beva Remund/Extender: [name] in Treatment: 23 HBO Safety Checklist Items Safety Checklist Consent Form Signed Patient voided / foley secured and emptied When did you last eato 0600 Last dose of injectable or oral agent Yesterday Ostomy pouch emptied and vented if applicable NA All implantable  devices assessed, documented and approved NA Intravenous access site secured and place NA Valuables secured Linens and cotton and cotton/polyester blend (less than 51% polyester) Personal oil-based products / skin lotions / body lotions removed Wigs or hairpieces removed NA Smoking or tobacco materials removed  NA Books / newspapers / magazines / loose paper removed Cologne, aftershave, perfume and deodorant removed Jewelry removed (may wrap wedding band) Make-up removed NA Hair care products removed Battery operated devices (external) removed Heating patches and chemical warmers removed Titanium eyewear removed Nail polish cured greater than 10 hours NA Casting material cured greater than 10 hours NA Hearing aids removed NA Loose dentures or partials removed dentures removed Prosthetics have been removed NA Patient demonstrates correct use of air break device (if applicable) Patient concerns have been addressed Patient grounding bracelet on and cord attached to chamber Specifics for Inpatients (complete in addition to above) Medication sheet sent with patient NA Intravenous medications needed or due during therapy sent with patient NA Drainage tubes (e.g. nasogastric tube or chest tube secured and vented) NA Endotracheal or Tracheotomy tube secured NA Cuff deflated of air and inflated with saline NA Airway suctioned NA Notes Paper version used prior to treatment start. Electronic Signature(s) Signed: 09/23/2022 3:53:40 PM By: [name] CHT EMT BS , , Entered By: [name] on 09/22/2022 13:37:12

## 2022-09-24 NOTE — Progress Notes (Signed)
BOW, ADER A ([id number]) [id number] Page 1 of 1 Visit Report for 09/23/2022 SuperBill Details Patient Name: Date of Service: [name] 09/23/2022 Medical Record Number: [id number] Patient Account Number: [id number] Date of Birth/Sex: Treating RN: 03-20-1956 (67 y.o. [name] Primary Care Provider: [name] Other Clinician: [name] Referring Provider: Treating Provider/Extender: [name], [name] in Treatment: 23 Diagnosis Coding ICD-10 Codes Code Description [phone] Non-pressure chronic ulcer of other part of left foot with fat layer exposed M86.172 Other acute osteomyelitis, left ankle and foot L97.528 Non-pressure chronic ulcer of other part of left foot with other specified severity E11.621 Type 2 diabetes mellitus with foot ulcer E11.42 Type 2 diabetes mellitus with diabetic polyneuropathy S91.301A Unspecified open wound, right foot, initial encounter 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 severity Facility Procedures CPT4 Code Description Modifier Quantity [id number] G0277-(Facility Use Only) HBOT full body chamber, 90min , 4 ICD-10 Diagnosis Description E11.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 M86.172 Other acute osteomyelitis, left ankle and foot Physician Procedures Quantity CPT4 Code Description Modifier [id number] - WC PHYS HYPERBARIC OXYGEN THERAPY 1 ICD-10 Diagnosis Description E11.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 M86.172 Other acute osteomyelitis, left ankle and  foot Electronic Signature(s) Signed: 09/23/2022 3:53:40 PM By: [name] CHT EMT BS , , Signed: 09/23/2022 6:05:31 PM By: Worthy Keeler PA-C Entered By: [name] on 09/23/2022 11:44:18

## 2022-09-24 NOTE — Progress Notes (Signed)
CLIFFORD, WESTMORELAND [name] ([id number]) [id number] Page 1 of 2 Visit Report for 09/23/2022 Arrival Information Details Patient Name: Date of Service: [name], [name] 09/23/2022 8:00 [name] Medical Record Number: [id number] Patient Account Number: [id number] Date of Birth/Sex: Treating RN: [birth year] (67 y.o. [name] Primary Care Cortasia Screws: [name] Other Clinician: [name] Referring Leslee Haueter: Treating Aziza Stuckert/Extender: [name], [name] in Treatment: 23 Visit Information History Since Last Visit All ordered tests and consults were completed: Yes Patient Arrived: Wheel Chair Added or deleted any medications: No Arrival Time: 07:44 Any new allergies or adverse reactions: No Accompanied By: self Had [name] fall or experienced change in No Transfer Assistance: None activities of daily living that may affect Patient Identification Verified: Yes risk of falls: Secondary Verification Process Completed: Yes Signs or symptoms of abuse/neglect since last visito No Patient Requires Transmission-Based Precautions: No Hospitalized since last visit: No Patient Has Alerts: Yes Implantable device outside of the clinic excluding No cellular tissue based products placed in the center since last visit: Pain Present Now: No Electronic Signature(s) Signed: 09/23/2022 3:53:40 PM By: [name] CHT EMT BS , , Entered By: [name] on 09/23/2022 11:33:15 -------------------------------------------------------------------------------- Encounter Discharge Information Details Patient Name: Date of Service: [name], [name] 09/23/2022 8:00 [name] Medical Record Number: [id number] Patient Account Number: [id number] Date of Birth/Sex: Treating RN: 11/07/1955 (67 y.o. [name] Primary Care Kori Goins: [name] Other Clinician: [name] Referring Leilanee Righetti: Treating Cierrah Dace/Extender: [name], [name] in Treatment: 23 Encounter Discharge Information Items Discharge Condition: Stable Ambulatory Status: Wheelchair Discharge Destination: Home Transportation: Private Auto Accompanied By: self Schedule Follow-up Appointment: No Clinical Summary of Care: Electronic Signature(s) Signed: 09/23/2022 3:53:40 PM By: [name] CHT EMT BS , , Entered By: [name] on 09/23/2022 11:44:42 -------------------------------------------------------------------------------- [place] Details Patient Name: Date of Service: [name], [name] 09/23/2022 8:00 [name] Medical Record Number: [id number] Patient Account Number: [id number] Date of Birth/Sex: Treating RN: 1956-01-17 (67 y.o. [name] Primary Care Kapil Petropoulos: [name] Other Clinician: [name] Referring Dorcas Melito: Treating Addisen Chappelle/Extender: [name], [name] in Treatment: 23 Vital Signs Time Taken: 08:04 Temperature (F): 97.3 [name] ([id number]) [phone].pdf Page 2 of 2 Height (in): 72 Pulse (bpm): 91 Weight (lbs): 220 Respiratory Rate (breaths/min): 16 Body Mass Index (BMI): 29.8 Blood Pressure (mmHg): 171/85 Capillary Blood Glucose (mg/dl): 237 Reference Range: 80 - 120 mg / dl Electronic Signature(s) Signed: 09/23/2022 3:53:40 PM By: [name] CHT EMT BS , , Entered By: [name] on 09/23/2022 11:33:49

## 2022-09-24 NOTE — Progress Notes (Signed)
TRACE, HANRAHAN [name] ([id number]) [id number] Page 1 of 2 Visit Report for 09/23/2022 HBO Details Patient Name: Date of Service: [name], [name] 09/23/2022 8:00 [name] Medical Record Number: [id number] Patient Account Number: [id number] Date of Birth/Sex: Treating RN: 1956/01/08 (67 y.o. [name] Primary Care [name]: [name] Other Clinician: [name] Referring Del Overfelt: Treating [name]/Extender: [name], [name] in Treatment: 23 HBO Treatment Course Details Treatment Course Number: 1 Ordering [name]: [name] T Treatments Ordered: otal 80 HBO Treatment Start Date: 06/01/2022 HBO Indication: Diabetic Ulcer(s) of the Lower Extremity Standard/Conservative Wound Care tried and failed greater than or equal to 30 days HBO Treatment Details Treatment Number: 69 Patient Type: Outpatient Chamber Type: Monoplace Chamber Serial #: [id number] Treatment Protocol: 2.0 ATA with 90 minutes oxygen, and no air breaks Treatment Details Compression Rate Down: 1.5 psi / minute De-Compression Rate Up: 2.0 psi / minute [name] breaks and breathing ir Compress Tx Pressure periods Decompress Decompress Begins Reached (leave unused spaces Begins Ends blank) Chamber Pressure (ATA 1 2 - - - - --2 1 ) Clock Time (24 hr) 08:11 08:25 08:55 09:00 09:30 09:35 - - 10:05 10:16 Treatment Length: 125 (minutes) Treatment Segments: 4 Vital Signs Capillary Blood Glucose Reference Range: 80 - 120 mg / dl HBO Diabetic Blood Glucose Intervention Range: <131 mg/dl or >249 mg/dl Type: Time Vitals Blood Respiratory Capillary Blood Glucose Pulse Action Pulse: Temperature: Taken: Pressure: Rate: Glucose (mg/dl): Meter #: Oximetry (%) Taken: Pre 08:04 171/85 91 16 97.3 237 1 none per protocol Post 10:23 158/78 67 18 97.7 191 1 none per protocol Treatment Response Treatment Toleration: Well Treatment Completion Status: Treatment Completed  without Adverse Event Treatment Notes Mr. [name] arrived, blood glucose was 237 mg/dL. He prepared for treatment . Other vitals were within range per protocol. After performing safety check, patient was placed in the chamber which was compressed with 100% oxygen at [name] rate of 1 psi/min until reaching 4 psig. Rate set was increased upon confirming normal ear equalization to 1.5 psi/min until reaching 8 psig. At this time rate set was increased to 2 psi/min and chamber was compressed at that rate until reaching treatment depth of 2.5 ATA. Mr. [name] tolerated the treatment and subsequent decompression of the chamber at 2 psi/min. Post treatment vital signs were also within range. Patient was stable upon discharge. Additional Procedure Documentation Tissue Sevierity: Necrosis of bone Electronic Signature(s) Signed: 09/23/2022 3:53:40 PM By: [name] CHT EMT BS , , Signed: 09/23/2022 6:05:31 PM By: Worthy Keeler PA-C Entered By: [name] on 09/23/2022 11:43:30 -------------------------------------------------------------------------------- HBO Safety Checklist Details Patient Name: Date of Service: [name], [name] 09/23/2022 8:00 [name] Medical Record Number: [id number] Patient Account Number: [id number] Date of Birth/Sex: Treating RN: 03-30-1956 (67 y.o. [name] Primary Care [name]: [name] Other Clinician: [name], [name] ([id number]) [id number] Page 2 of 2 Referring [name]: Treating [name]/Extender: [name], [name] in Treatment: 23 HBO Safety Checklist Items Safety Checklist Consent Form Signed Patient voided / foley secured and emptied When did you last eato 0630 Last dose of injectable or oral agent 0645 Ostomy pouch emptied and vented if applicable NA All implantable devices assessed, documented and approved NA Intravenous access site secured and place NA Valuables  secured Linens and cotton and cotton/polyester blend (less than 51% polyester) Personal oil-based products / skin lotions / body lotions removed Wigs or hairpieces removed NA Smoking or  tobacco materials removed NA Books / newspapers / magazines / loose paper removed Cologne, aftershave, perfume and deodorant removed Jewelry removed (may wrap wedding band) Make-up removed NA Hair care products removed Battery operated devices (external) removed Heating patches and chemical warmers removed Titanium eyewear removed NA Nail polish cured greater than 10 hours NA Casting material cured greater than 10 hours NA Hearing aids removed NA Loose dentures or partials removed dentures removed Prosthetics have been removed NA Patient demonstrates correct use of air break device (if applicable) Patient concerns have been addressed Patient grounding bracelet on and cord attached to chamber Specifics for Inpatients (complete in addition to above) Medication sheet sent with patient NA Intravenous medications needed or due during therapy sent with patient NA Drainage tubes (e.g. nasogastric tube or chest tube secured and vented) NA Endotracheal or Tracheotomy tube secured NA Cuff deflated of air and inflated with saline NA Airway suctioned NA Notes Paper version used prior to treatment start. Electronic Signature(s) Signed: 09/23/2022 3:53:40 PM By: [name] CHT EMT BS , , Entered By: [name] on 09/23/2022 11:35:21

## 2022-09-24 NOTE — Progress Notes (Signed)
[name] ([id number]) [id number] Page 1 of 2 Visit Report for 09/22/2022 Arrival Information Details Patient Name: Date of Service: [name], [name] 09/22/2022 8:00 [name] Medical Record Number: [id number] Patient Account Number: [id number] Date of Birth/Sex: Treating RN: [birth year] (67 y.o. [name] Primary Care Jasmane Brockway: [name] Other Clinician: [name] Referring Deshone Lyssy: Treating Tyse Auriemma/Extender: [name] in Treatment: 23 Visit Information History Since Last Visit All ordered tests and consults were completed: Yes Patient Arrived: Ambulatory Added or deleted any medications: No Arrival Time: 07:30 Any new allergies or adverse reactions: No Accompanied By: self Had [name] fall or experienced change in No Transfer Assistance: None activities of daily living that may affect Patient Identification Verified: Yes risk of falls: Secondary Verification Process Completed: Yes Signs or symptoms of abuse/neglect since last visito No Patient Requires Transmission-Based Precautions: No Hospitalized since last visit: No Patient Has Alerts: Yes Implantable device outside of the clinic excluding No cellular tissue based products placed in the center since last visit: Pain Present Now: No Electronic Signature(s) Signed: 09/23/2022 3:53:40 PM By: [name] CHT EMT BS , , Entered By: [name] on 09/22/2022 13:35:46 -------------------------------------------------------------------------------- Encounter Discharge Information Details Patient Name: Date of Service: [name], [name] 09/22/2022 8:00 [name] Medical Record Number: [id number] Patient Account Number: [id number] Date of Birth/Sex: Treating RN: 1956/01/11 (67 y.o. [name] Primary Care Tahra Hitzeman: [name] Other Clinician: [name] Referring Domanik Rain[place]: Treating Jasleen Riepe/Extender: [name] in Treatment: 23 Encounter Discharge Information Items Discharge Condition: Stable Ambulatory Status: Wheelchair Discharge Destination: Home Transportation: Private Auto Accompanied By: spouse Schedule Follow-up Appointment: No Clinical Summary of Care: Electronic Signature(s) Signed: 09/23/2022 3:53:40 PM By: [name] CHT EMT BS , , Entered By: [name] on 09/22/2022 13:45:16 -------------------------------------------------------------------------------- Vitals Details Patient Name: Date of Service: [name], [name] 09/22/2022 8:00 [name] Medical Record Number: [id number] Patient Account Number: [id number] Date of Birth/Sex: Treating RN: [birth year] (67 y.o. [name] Primary Care Arlington Sigmund: [name] Other Clinician: [name] Referring Blaike Newburn: Treating Hinton Luellen/Extender: [name], [name] in Treatment: 23 Vital Signs Time Taken: 08:13 Temperature (F): 98.0 XAIDEN, LEARN [name] ([id number]) [phone].pdf Page 2 of 2 Height (in): 72 Pulse (bpm): 91 Weight (lbs): 220 Respiratory Rate (breaths/min): 18 Body Mass Index (BMI): 29.8 Blood Pressure (mmHg): 172/82 Capillary Blood Glucose (mg/dl): 306 Reference Range: 80 - 120 mg / dl Electronic Signature(s) Signed: 09/23/2022 3:53:40 PM By: [name] CHT EMT BS , , Entered By: [name] on 09/22/2022 13:36:11

## 2022-09-25 ENCOUNTER — Ambulatory Visit (INDEPENDENT_AMBULATORY_CARE_PROVIDER_SITE_OTHER): Payer: Medicare HMO | Admitting: Physician Assistant

## 2022-09-25 ENCOUNTER — Encounter (HOSPITAL_BASED_OUTPATIENT_CLINIC_OR_DEPARTMENT_OTHER): Payer: Medicare HMO | Admitting: Internal Medicine

## 2022-09-25 ENCOUNTER — Encounter: Payer: Self-pay | Admitting: Physician Assistant

## 2022-09-25 VITALS — BP 159/94 | HR 97 | Temp 97.6°F | Resp 20 | Ht 72.0 in | Wt 240.0 lb

## 2022-09-25 DIAGNOSIS — I70223 Atherosclerosis of native arteries of extremities with rest pain, bilateral legs: Secondary | ICD-10-CM

## 2022-09-26 NOTE — Progress Notes (Signed)
JUDE, SKIPTON A ([id number]) [id number] Page 1 of 1 Visit Report for 09/24/2022 Super[employer] Details Patient Name: Date of Service: [name] 09/24/2022 Medical Record Number: [id number] Patient Account Number: [id number] Date of Birth/Sex: Treating RN: April 24, 1956 (67 y.o. 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 S91.301A Unspecified open wound, right foot, initial encounter 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 severity Facility Procedures CPT4 Code Description Modifier Quantity [id number]-(Facility Use Only) HBOT full body chamber, 30min , 4 ICD-10 Diagnosis Description E11.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 M86.172 Other acute osteomyelitis, left ankle and foot Physician Procedures Quantity CPT4 Code Description Modifier [id number] - WC PHYS HYPERBARIC OXYGEN THERAPY 1 ICD-10 Diagnosis Description 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

## 2022-09-26 NOTE — Progress Notes (Signed)
TIBOR, [name] ([id number]) [id number] Page 1 of 2 Visit Report for 09/24/2022 HBO Details Patient Name: Date of Service: [name], [name] 09/24/2022 8:00 [name] Medical Record Number: [id number] Patient Account Number: [id number] Date of Birth/Sex: Treating RN: 04-23-1956 (67 y.o. [name] Session Primary Care Fleet Higham: [name] Other Clinician: [name] Referring [place]: Treating [name] Ruddy/Extender: [name] in Treatment: 23 HBO Treatment Course Details Treatment Course Number: 1 Ordering Keishana Klinger: [name] T Treatments Ordered: otal 80 HBO Treatment Start Date: 06/01/2022 HBO Indication: Diabetic Ulcer(s) of the Lower Extremity Standard/Conservative Wound Care tried and failed greater than or equal to 30 days HBO Treatment Details Treatment Number: 70 Patient Type: Outpatient Chamber Type: Monoplace Chamber Serial #: [id number] Treatment Protocol: 2.0 ATA with 90 minutes oxygen, and no air breaks Treatment Details Compression Rate Down: 1.5 psi / minute De-Compression Rate Up: 2.0 psi / minute [name] breaks and breathing ir Compress Tx Pressure periods Decompress Decompress Begins Reached (leave unused spaces Begins Ends blank) Chamber Pressure (ATA 1 2 - - - - --2 1 ) Clock Time (24 hr) 08:13 08:26 08:56 09:01 09:31 09:36 - - 10:06 10:17 Treatment Length: 124 (minutes) Treatment Segments: 4 Vital Signs Capillary Blood Glucose Reference Range: 80 - 120 mg / dl HBO Diabetic Blood Glucose Intervention Range: <131 mg/dl or >249 mg/dl Type: Time Vitals Blood Respiratory Capillary Blood Glucose Pulse Action Pulse: Temperature: Taken: Pressure: Rate: Glucose (mg/dl): Meter #: Oximetry (%) Taken: Pre 07:50 128/70 78 18 97.4 184 1 none per protocol Post 10:38 133/82 74 18 97.5 145 1 none per protocol Treatment Response Treatment Toleration: Well Treatment Completion Status: Treatment Completed without  Adverse Event Additional Procedure Documentation Tissue Sevierity: Necrosis of bone Physician HBO Attestation: I certify that I supervised this HBO treatment in accordance with Medicare guidelines. [name] trained emergency response team is readily available per Yes hospital policies and procedures. Continue HBOT as ordered. Yes Electronic Signature(s) Signed: 09/25/2022 11:11:47 AM By: [name] DO Previous Signature: 09/24/2022 1:29:58 PM Version By: [name] DO Previous Signature: 09/25/2022 8:52:56 AM Version By: [name] CHT EMT BS , , Entered By: [name] on 09/25/2022 09:56:31 -------------------------------------------------------------------------------- HBO Safety Checklist Details Patient Name: Date of Service: [name], [name] 09/24/2022 8:00 [name] Medical Record Number: [id number] Patient Account Number: [id number] Date of Birth/Sex: Treating RN: 1955-09-20 (67 y.o. [name], [name], [name] ([id number]) [id number] Page 2 of 2 Primary Care Orry Sigl: [name] Other Clinician: [name] Referring Russ Looper: Treating Dyane Broberg/Extender: [name], [name] in Treatment: 23 HBO Safety Checklist Items Safety Checklist Consent Form Signed Patient voided / foley secured and emptied When did you last eato 0715 Last dose of injectable or oral agent 0635 Ostomy pouch emptied and vented if applicable NA All implantable devices assessed, documented and approved NA Intravenous access site secured and place NA Valuables secured Linens and cotton and cotton/polyester blend (less than 51% polyester) Personal oil-based products / skin lotions / body lotions removed Wigs or hairpieces removed NA Smoking or tobacco materials removed NA Books / newspapers / magazines / loose paper removed Cologne, aftershave, perfume and deodorant removed Jewelry removed (may wrap wedding band) Make-up  removed Hair care products removed Battery operated devices (external) removed Heating patches and chemical warmers removed Titanium eyewear removed Nail polish cured greater than 10 hours NA Casting material cured greater than 10 hours NA Hearing aids removed NA Loose dentures or partials removed dentures removed Prosthetics  have been removed NA Patient demonstrates correct use of air break device (if applicable) Patient concerns have been addressed Patient grounding bracelet on and cord attached to chamber Specifics for Inpatients (complete in addition to above) Medication sheet sent with patient NA Intravenous medications needed or due during therapy sent with patient NA Drainage tubes (e.g. nasogastric tube or chest tube secured and vented) NA Endotracheal or Tracheotomy tube secured NA Cuff deflated of air and inflated with saline NA Airway suctioned NA Notes Paper version used prior to treatment start. Electronic Signature(s) Signed: 09/25/2022 8:52:56 AM By: [name] CHT EMT BS , , Entered By: [name] on 09/24/2022 12:41:12

## 2022-09-26 NOTE — Progress Notes (Signed)
HUEL, CUTRER A ([id number]) [id number] Page 1 of 2 Visit Report for 09/24/2022 Arrival Information Details Patient Name: Date of Service: [name], [name] 09/24/2022 8:00 A M Medical Record Number: [id number] Patient Account Number: [id number] Date of Birth/Sex: Treating RN: 12-Nov-1955 (67 y.o. [name] Primary Care Minie Roadcap: [name] Other Clinician: [name] Referring Doreene Forrey: Treating Carvell Hoeffner/Extender: [name] in Treatment: 23 Visit Information History Since Last Visit All ordered tests and consults were completed: Yes Patient Arrived: Wheel Chair Added or deleted any medications: No Arrival Time: 07:30 Any new allergies or adverse reactions: No Accompanied By: self Had a fall or experienced change in No Transfer Assistance: None activities of daily living that may affect Patient Identification Verified: Yes risk of falls: Secondary Verification Process Completed: Yes Signs or symptoms of abuse/neglect since last visito No Patient Requires Transmission-Based Precautions: No Hospitalized since last visit: No Patient Has Alerts: Yes Implantable device outside of the clinic excluding No cellular tissue based products placed in the center since last visit: Pain Present Now: No Electronic Signature(s) Signed: 09/25/2022 8:52:56 AM By: [name] CHT EMT BS , , Entered By: [name] on 09/24/2022 12:38:03 -------------------------------------------------------------------------------- Encounter Discharge Information Details Patient Name: Date of Service: [name], [name] 09/24/2022 8:00 A M Medical Record Number: [id number] Patient Account Number: [id number] Date of Birth/Sex: Treating RN: Dec 01, 1955 (67 y.o. [name] Primary Care Makih Stefanko: [name] Other Clinician: [name] Referring Colena Ketterman: Treating Tyrah Broers/Extender: [name] in Treatment: 23 Encounter Discharge Information Items Discharge Condition: Stable Ambulatory Status: Wheelchair Discharge Destination: Other (Note Required) Transportation: Other Accompanied By: staff Schedule Follow-up Appointment: No Clinical Summary of Care: Notes Wound care visit post-treatment. Electronic Signature(s) Signed: 09/25/2022 8:52:56 AM By: [name] CHT EMT BS , , Entered By: [name] on 09/24/2022 12:45:28 -------------------------------------------------------------------------------- Vitals Details Patient Name: Date of Service: [name], [name] 09/24/2022 8:00 A M Medical Record Number: [id number] Patient Account Number: [id number] Date of Birth/Sex: Treating RN: 1956/02/28 (67 y.o. [name] Primary Care Oluwademilade Kellett: [name] Other Clinician: [name] Referring Cyrstal Leitz: Treating Izela Altier/Extender: [name] in Treatment: [address] A ([id number]) [id number] Page 2 of 2 Vital Signs Time Taken: 07:50 Temperature (F): 97.4 Height (in): 72 Pulse (bpm): 78 Weight (lbs): 220 Respiratory Rate (breaths/min): 18 Body Mass Index (BMI): 29.8 Blood Pressure (mmHg): 128/70 Capillary Blood Glucose (mg/dl): 184 Reference Range: 80 - 120 mg / dl Electronic Signature(s) Signed: 09/25/2022 8:52:56 AM By: [name] CHT EMT BS , , Entered By: [name] on 09/24/2022 12:39:48

## 2022-09-28 ENCOUNTER — Encounter (HOSPITAL_BASED_OUTPATIENT_CLINIC_OR_DEPARTMENT_OTHER): Payer: Medicare HMO | Admitting: Internal Medicine

## 2022-09-28 DIAGNOSIS — M86172 Other acute osteomyelitis, left ankle and foot: Secondary | ICD-10-CM | POA: Diagnosis not present

## 2022-09-28 DIAGNOSIS — L97522 Non-pressure chronic ulcer of other part of left foot with fat layer exposed: Secondary | ICD-10-CM

## 2022-09-28 DIAGNOSIS — L97528 Non-pressure chronic ulcer of other part of left foot with other specified severity: Secondary | ICD-10-CM | POA: Diagnosis not present

## 2022-09-28 DIAGNOSIS — E11621 Type 2 diabetes mellitus with foot ulcer: Secondary | ICD-10-CM

## 2022-09-28 LAB — GLUCOSE, CAPILLARY
Glucose-Capillary: 222 mg/dL — ABNORMAL HIGH (ref 70–99)
Glucose-Capillary: 194 mg/dL — ABNORMAL HIGH (ref 70–99)

## 2022-09-28 NOTE — Progress Notes (Signed)
[name] ([id number]) [id number] Page 1 of 2 Visit Report for 09/28/2022 Arrival Information Details Patient Name: Date of Service: [name], [name]. 09/28/2022 8:00 [name] Medical Record Number: [id number] Patient Account Number: [id number] Date of Birth/Sex: Treating RN: 1956-06-14 (67 y.o. [name] Primary Care Naquan Garman: [name] Other Clinician: [name] Referring Cattaleya Wien: Treating Bridie Colquhoun/Extender: [name] in Treatment: 23 Visit Information History Since Last Visit All ordered tests and consults were completed: Yes Patient Arrived: Wheel Chair Added or deleted any medications: No Arrival Time: 07:31 Any new allergies or adverse reactions: No Accompanied By: self Had [name] fall or experienced change in No Transfer Assistance: None activities of daily living that may affect Patient Identification Verified: Yes risk of falls: Secondary Verification Process Completed: Yes Signs or symptoms of abuse/neglect since last visito No Patient Requires Transmission-Based Precautions: No Hospitalized since last visit: No Patient Has Alerts: Yes Implantable device outside of the clinic excluding No cellular tissue based products placed in the center since last visit: Pain Present Now: No Electronic Signature(s) Signed: 09/28/2022 12:52:52 PM By: [name] CHT EMT BS , , Entered By: [name] on 09/28/2022 12:52:51 -------------------------------------------------------------------------------- Encounter Discharge Information Details Patient Name: Date of Service: [name], [name]. 09/28/2022 8:00 [name] Medical Record Number: [id number] Patient Account Number: [id number] Date of Birth/Sex: Treating RN: 08-13-1955 (67 y.o. [name] Primary Care Kardell Virgil: [name] Other Clinician: [name] Referring Christabelle Hanzlik: Treating Dante Cooter/Extender: [name] in Treatment: 23 Encounter Discharge Information Items Discharge Condition: Stable Ambulatory Status: Wheelchair Discharge Destination: Home Transportation: Private Auto Accompanied By: self Schedule Follow-up Appointment: No Clinical Summary of Care: Electronic Signature(s) Signed: 09/28/2022 1:04:29 PM By: [name] CHT EMT BS , , Entered By: [name] on 09/28/2022 13:04:29 -------------------------------------------------------------------------------- Vitals Details Patient Name: Date of Service: [name], [name]. 09/28/2022 8:00 [name] Medical Record Number: [id number] Patient Account Number: [id number] Date of Birth/Sex: Treating RN: 1955/12/04 (67 y.o. [name] Primary Care Therasa Lorenzi: [name] Other Clinician: [name] Referring Windsor Goeken: Treating Xaiden Fleig/Extender: [name], [name] in Treatment: 23 Vital Signs Time Taken: 07:43 Temperature (F): 97.6 [name], [name] [name] ([id number]) [id number] Page 2 of 2 Height (in): 72 Pulse (bpm): 86 Weight (lbs): 220 Respiratory Rate (breaths/min): 18 Body Mass Index (BMI): 29.8 Blood Pressure (mmHg): 133/73 Capillary Blood Glucose (mg/dl): 222 Reference Range: 80 - 120 mg / dl Electronic Signature(s) Signed: 09/28/2022 12:56:52 PM By: [name] CHT EMT BS , , Entered By: [name] on 09/28/2022 12:56:52

## 2022-09-28 NOTE — Progress Notes (Signed)
CARDON, TREVORROW A ([id number]) [id number] Page 1 of 1 Visit Report for 09/28/2022 SuperBill Details Patient Name: Date of Service: [name] 09/28/2022 Medical Record Number: [id number] Patient Account Number: [id number] Date of Birth/Sex: Treating RN: 07/29/1955 (67 y.o. [name] Primary Care Provider: [name] Other Clinician: [name] Referring Provider: Treating Provider/Extender: [name] in Treatment: 23 Diagnosis Coding ICD-10 Codes Code Description [phone] Non-pressure chronic ulcer of other part of left foot with fat layer exposed M86.172 Other acute osteomyelitis, left ankle and foot L97.528 Non-pressure chronic ulcer of other part of left foot with other specified severity E11.621 Type 2 diabetes mellitus with foot ulcer E11.42 Type 2 diabetes mellitus with diabetic polyneuropathy S91.301A Unspecified open wound, right foot, initial encounter 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 severity Facility Procedures CPT4 Code Description Modifier Quantity [id number]-(Facility Use Only) HBOT full body chamber, 65min , 4 ICD-10 Diagnosis Description E11.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 M86.172 Other acute osteomyelitis, left ankle and foot Physician Procedures Quantity CPT4 Code Description Modifier [id number] - WC PHYS HYPERBARIC OXYGEN THERAPY 1 ICD-10 Diagnosis Description E11.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

## 2022-09-28 NOTE — Progress Notes (Addendum)
BRANN, BARSCH [name] ([id number]) [id number] Page 1 of 2 Visit Report for 09/28/2022 HBO Details Patient Name: Date of Service: [name], [name]. 09/28/2022 8:00 [name] Medical Record Number: [id number] Patient Account Number: [id number] Date of Birth/Sex: Treating RN: 08-31-1955 (67 y.o. [name] Primary Care Quincy Prisco: [name] Other Clinician: [name] Referring Davon Folta: Treating Lilliahna Schubring/Extender: [name] in Treatment: 23 HBO Treatment Course Details Treatment Course Number: 1 Ordering Sharol Croghan: [name] T Treatments Ordered: otal 80 HBO Treatment Start Date: 06/01/2022 HBO Indication: Diabetic Ulcer(s) of the Lower Extremity Standard/Conservative Wound Care tried and failed greater than or equal to 30 days HBO Treatment Details Treatment Number: 71 Patient Type: Outpatient Chamber Type: Monoplace Chamber Serial #: [id number] Treatment Protocol: 2.5 ATA with 90 minutes oxygen, with two 5 minute air breaks Treatment Details Compression Rate Down: 1.5 psi / minute De-Compression Rate Up: 2.0 psi / minute [name] breaks and breathing ir Compress Tx Pressure periods Decompress Decompress Begins Reached (leave unused spaces Begins Ends blank) Chamber Pressure (ATA 1 2.5 2.5 2.5 2.5 2.5 - - 2.5 1 ) Clock Time (24 hr) 08:08 08:24 08:54 08:59 09:29 09:34 - - 10:04 10:15 Treatment Length: 127 (minutes) Treatment Segments: 4 Vital Signs Capillary Blood Glucose Reference Range: 80 - 120 mg / dl HBO Diabetic Blood Glucose Intervention Range: <131 mg/dl or >249 mg/dl Type: Time Vitals Blood Respiratory Capillary Blood Glucose Pulse Action Pulse: Temperature: Taken: Pressure: Rate: Glucose (mg/dl): Meter #: Oximetry (%) Taken: Pre 07:43 133/73 86 18 97.6 222 1 none per protocol Post 10:22 141/92 78 18 97.2 194 1 none per protocol Treatment Response Treatment Toleration: Well Treatment Completion Status:  Treatment Completed without Adverse Event Treatment Notes Upon arrival, patient's blood glucose level was 222 mg/dL. He prepared for treatment. Other vital signs were within normal range. After performing safety check, patient was placed in the chamber which was compressed at 2 psi/min after confirming normal ear equalization. [name] tolerated the treatment and the decompression of the chamber that followed at 2 psi/min. Patient denied any issues with ear equalization and/or pain. He was stable upon discharge. Additional Procedure Documentation Tissue Sevierity: Necrosis of bone Physician HBO Attestation: I certify that I supervised this HBO treatment in accordance with Medicare guidelines. [name] trained emergency response team is readily available per Yes hospital policies and procedures. Continue HBOT as ordered. Yes Electronic Signature(s) Signed: 10/01/2022 2:37:57 PM By: [name] CHT EMT BS , , Signed: 10/01/2022 3:02:48 PM By: [name] DO Previous Signature: 09/29/2022 10:35:32 AM Version By: [name] DO Previous Signature: 09/28/2022 1:03:25 PM Version By: [name] CHT EMT BS , , Previous Signature: 09/28/2022 3:19:45 PM Version By: [name] DO Entered By: [name] on 10/01/2022 14:37:57 [name] ([id number] Page 2 of 2 -------------------------------------------------------------------------------- HBO Safety Checklist Details Patient Name: Date of Service: [name], [name]. 09/28/2022 8:00 [name] Medical Record Number: [id number] Patient Account Number: [id number] Date of Birth/Sex: Treating RN: [birth year] (67 y.o. [name] Primary Care Nickolis Diel: [name] Other Clinician: [name] Referring Terrel Nesheiwat: Treating Sylvan Lahm/Extender: [name] in Treatment: 23 HBO Safety Checklist Items Safety Checklist Consent Form Signed Patient  voided / foley secured and emptied When did you last eato 0700 Last dose of injectable or oral agent 0625 Ostomy pouch emptied and vented if applicable NA All implantable devices assessed, documented and approved NA Intravenous access site secured and place NA Valuables secured  Linens and cotton and cotton/polyester blend (less than 51% polyester) Personal oil-based products / skin lotions / body lotions removed Wigs or hairpieces removed NA Smoking or tobacco materials removed NA Books / newspapers / magazines / loose paper removed Cologne, aftershave, perfume and deodorant removed Jewelry removed (may wrap wedding band) Make-up removed NA Hair care products removed Battery operated devices (external) removed Heating patches and chemical warmers removed Titanium eyewear removed Nail polish cured greater than 10 hours NA Casting material cured greater than 10 hours NA Hearing aids removed NA Loose dentures or partials removed Prosthetics have been removed NA Patient demonstrates correct use of air break device (if applicable) Patient concerns have been addressed Patient grounding bracelet on and cord attached to chamber Specifics for Inpatients (complete in addition to above) Medication sheet sent with patient NA Intravenous medications needed or due during therapy sent with patient NA Drainage tubes (e.g. nasogastric tube or chest tube secured and vented) NA Endotracheal or Tracheotomy tube secured NA Cuff deflated of air and inflated with saline NA Airway suctioned NA Notes Paper version used prior to treatment start. Electronic Signature(s) Signed: 09/28/2022 12:58:48 PM By: [name] CHT EMT BS , , Entered By: [name] on 09/28/2022 12:58:48

## 2022-09-29 ENCOUNTER — Encounter (HOSPITAL_BASED_OUTPATIENT_CLINIC_OR_DEPARTMENT_OTHER): Payer: Medicare HMO | Admitting: Internal Medicine

## 2022-09-29 DIAGNOSIS — M86172 Other acute osteomyelitis, left ankle and foot: Secondary | ICD-10-CM | POA: Diagnosis not present

## 2022-09-29 DIAGNOSIS — L97522 Non-pressure chronic ulcer of other part of left foot with fat layer exposed: Secondary | ICD-10-CM

## 2022-09-29 DIAGNOSIS — L97528 Non-pressure chronic ulcer of other part of left foot with other specified severity: Secondary | ICD-10-CM | POA: Diagnosis not present

## 2022-09-29 DIAGNOSIS — E11621 Type 2 diabetes mellitus with foot ulcer: Secondary | ICD-10-CM | POA: Diagnosis not present

## 2022-09-29 LAB — GLUCOSE, CAPILLARY: Glucose-Capillary: 348 mg/dL — ABNORMAL HIGH (ref 70–99)

## 2022-09-30 ENCOUNTER — Other Ambulatory Visit: Payer: Self-pay

## 2022-09-30 ENCOUNTER — Encounter (HOSPITAL_BASED_OUTPATIENT_CLINIC_OR_DEPARTMENT_OTHER): Payer: Medicare HMO | Admitting: Physician Assistant

## 2022-09-30 DIAGNOSIS — I70223 Atherosclerosis of native arteries of extremities with rest pain, bilateral legs: Secondary | ICD-10-CM

## 2022-09-30 DIAGNOSIS — E11621 Type 2 diabetes mellitus with foot ulcer: Secondary | ICD-10-CM | POA: Diagnosis not present

## 2022-09-30 LAB — GLUCOSE, CAPILLARY
Glucose-Capillary: 205 mg/dL — ABNORMAL HIGH (ref 70–99)
Glucose-Capillary: 215 mg/dL — ABNORMAL HIGH (ref 70–99)
Glucose-Capillary: 115 mg/dL — ABNORMAL HIGH (ref 70–99)

## 2022-09-30 NOTE — Progress Notes (Signed)
PHEONIX, OBAS A ([id number]) [id number] Page 1 of 1 Visit Report for 09/30/2022 SuperBill Details Patient Name: Date of Service: [name] 09/30/2022 Medical Record Number: [id number] Patient Account Number: [id number] Date of Birth/Sex: Treating RN: 1955/12/14 (67 y.o. 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.621 Type 2 diabetes mellitus with foot ulcer E11.42 Type 2 diabetes mellitus with diabetic polyneuropathy S91.301A Unspecified open wound, right foot, initial encounter 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 severity Facility Procedures CPT4 Code Description Modifier Quantity [id number] [id number]-(Facility Use Only) HBOT full body chamber, 107min , 5 ICD-10 Diagnosis Description E11.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 M86.172 Other acute osteomyelitis, left ankle and foot Physician Procedures Quantity CPT4 Code Description Modifier [id number] - WC PHYS HYPERBARIC OXYGEN THERAPY 1 ICD-10 Diagnosis Description E11.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

## 2022-09-30 NOTE — Progress Notes (Addendum)
WALEED, NAIFEH [name] ([id number]) [id number] Page 1 of 2 Visit Report for 09/30/2022 Arrival Information Details Patient Name: Date of Service: [name], [name] 09/30/2022 8:00 [name] Medical Record Number: [id number] Patient Account Number: [id number] Date of Birth/Sex: Treating RN: January 05, 1956 (67 y.o. [name] Primary Care Irl Bodie: [name] Other Clinician: [name] Referring Suhas Estis: Treating Daaiyah Baumert/Extender: [name], [name] in Treatment: 24 Visit Information History Since Last Visit All ordered tests and consults were completed: Yes Patient Arrived: Wheel Chair Added or deleted any medications: No Arrival Time: 07:30 Any new allergies or adverse reactions: No Accompanied By: self Had [name] fall or experienced change in No Transfer Assistance: None activities of daily living that may affect Patient Identification Verified: Yes risk of falls: Secondary Verification Process Completed: Yes Signs or symptoms of abuse/neglect since last visito No Patient Requires Transmission-Based Precautions: No Hospitalized since last visit: No Patient Has Alerts: Yes Implantable device outside of the clinic excluding No cellular tissue based products placed in the center since last visit: Pain Present Now: No Electronic Signature(s) Signed: 09/30/2022 10:47:05 AM By: [name] CHT EMT BS , , Entered By: [name] on 09/30/2022 10:47:03 -------------------------------------------------------------------------------- Encounter Discharge Information Details Patient Name: Date of Service: [name], [name] 09/30/2022 8:00 [name] Medical Record Number: [id number] Patient Account Number: [id number] Date of Birth/Sex: Treating RN: June 28, 1956 (67 y.o. [name] Primary Care Kaevion Sinclair: [name] Other Clinician: [name] Referring Loreal Schuessler: Treating Ira Busbin/Extender: [name], [name] in Treatment: 24 Encounter Discharge Information Items Discharge Condition: Stable Ambulatory Status: Wheelchair Discharge Destination: Home Transportation: Private Auto Accompanied By: self Schedule Follow-up Appointment: No Clinical Summary of Care: Electronic Signature(s) Signed: 09/30/2022 2:52:53 PM By: [name] CHT EMT BS , , Entered By: [name] on 09/30/2022 14:52:53 -------------------------------------------------------------------------------- Vitals Details Patient Name: Date of Service: [name], [name] 09/30/2022 8:00 [name] Medical Record Number: [id number] Patient Account Number: [id number] Date of Birth/Sex: Treating RN: December 14, 1955 (67 y.o. [name] Primary Care Rustin Erhart: [name] Other Clinician: [name] Referring Lounell Schumacher: Treating Marlisha Vanwyk/Extender: [name], [name] in Treatment: 24 Vital Signs Time Taken: 07:45 Temperature (F): 97.5 [name], [name] ([id number]) [id number] Page 2 of 2 Height (in): 72 Pulse (bpm): 86 Weight (lbs): 220 Respiratory Rate (breaths/min): 18 Body Mass Index (BMI): 29.8 Blood Pressure (mmHg): 122/88 Capillary Blood Glucose (mg/dl): 215 Reference Range: 80 - 120 mg / dl Electronic Signature(s) Signed: 09/30/2022 10:49:18 AM By: [name] CHT EMT BS , , Entered By: [name] on 09/30/2022 10:49:17

## 2022-09-30 NOTE — Progress Notes (Addendum)
[name], [name] A ([id number]) [id number] Page 1 of 2 Visit Report for 09/30/2022 HBO Details Patient Name: Date of Service: [name], [name] 09/30/2022 8:00 A M Medical Record Number: [id number] Patient Account Number: [id number] Date of Birth/Sex: Treating RN: October 14, 1955 (67 y.o. [name] Primary Care Cheryel Kyte: [name] Other Clinician: [name] Referring Muriel Hannold: Treating Han Lysne/Extender: [name], [name] in Treatment: 24 HBO Treatment Course Details Treatment Course Number: 1 Ordering [name]: [name] T Treatments Ordered: otal 80 HBO Treatment Start Date: 06/01/2022 HBO Indication: Diabetic Ulcer(s) of the Lower Extremity Standard/Conservative Wound Care tried and failed greater than or equal to 30 days HBO Treatment Details Treatment Number: 73 Patient Type: Outpatient Chamber Type: Monoplace Chamber Serial #: [id number] Treatment Protocol: 2.5 ATA with 90 minutes oxygen, with two 5 minute air breaks Treatment Details Compression Rate Down: 1.0 psi / minute De-Compression Rate Up: 1.0 psi / minute A breaks and breathing ir Compress Tx Pressure periods Decompress Decompress Begins Reached (leave unused spaces Begins Ends blank) Chamber Pressure (ATA 1 2.5 2.5 2.5 2.5 2.5 - - 2.5 1 ) Clock Time (24 hr) 08:24 08:50 09:20 09:25 09:55 10:00 - - 10:30 10:53 Treatment Length: 149 (minutes) Treatment Segments: 5 Vital Signs Capillary Blood Glucose Reference Range: 80 - 120 mg / dl HBO Diabetic Blood Glucose Intervention Range: <131 mg/dl or >249 mg/dl Type: Time Vitals Blood Respiratory Capillary Blood Glucose Pulse Action Pulse: Temperature: Taken: Pressure: Rate: Glucose (mg/dl): Meter #: Oximetry (%) Taken: Pre 07:45 122/88 86 18 97.5 215 1 none per protocol Post 10:58 148/83 67 18 97.9 115 1 none per protocol Treatment Response Treatment Toleration: Well Treatment Completion  Status: Treatment Completed without Adverse Event Treatment Notes Mr. Neville arrived and blood glucose level was measured at 215 mg/dL. He prepared for treatment and other vital signs were taken; all within normal range. After performing a safety check, patient was placed in the chamber which was compressed with 100% oxygen at a rate of 1 psi/min. Upon reaching 5psig, patient complained of sinus pressure/pain. [name] described it as a dull pressure in his sinuses. Chamber pressure was reversed and patient stated the symptoms were relieved (3 psig). Chamber compression continued, thereafter, at 1 psi/min until reaching treatment pressure of 2.5 ATA without any further complaint. [name] tolerated treatment and the chamber decompression that followed at 2 psi/min. He denied any issues with ear equalization and/or pain. He was stable upon discharge. Additional Procedure Documentation Tissue Sevierity: Necrosis of bone Electronic Signature(s) Signed: 10/01/2022 2:36:42 PM By: [name] CHT EMT BS , , Signed: 10/02/2022 2:14:59 PM By: Worthy Keeler PA-C Previous Signature: 09/30/2022 2:17:59 PM Version By: [name] CHT EMT BS , , Previous Signature: 09/30/2022 3:38:10 PM Version By: Worthy Keeler PA-C Previous Signature: 09/30/2022 10:54:45 AM Version By: [name] CHT EMT BS , , Entered By: [name] on 10/01/2022 14:36:42 -------------------------------------------------------------------------------- HBO Safety Checklist Details Patient Name: Date of Service: [name], [name] 09/30/2022 8:00 A Toney Sang, Docia Barrier A ([id number]) [id number].pdf Page 2 of 2 Medical Record Number: [id number] Patient Account Number: [id number] Date of Birth/Sex: Treating RN: 05-08-1956 (67 y.o. [name] Primary Care Masa Lubin: [name] Other Clinician: [name] Referring Brennyn Haisley: Treating Geraline Halberstadt/Extender: [name], [name] in Treatment: 24 HBO Safety Checklist Items Safety Checklist Consent Form Signed Patient voided / foley secured and emptied When did you last eato 0700 Last dose of injectable  or oral agent 0620 Ostomy pouch emptied and vented if applicable NA All implantable devices assessed, documented and approved NA Intravenous access site secured and place NA Valuables secured Linens and cotton and cotton/polyester blend (less than 51% polyester) Personal oil-based products / skin lotions / body lotions removed Wigs or hairpieces removed NA Smoking or tobacco materials removed NA Books / newspapers / magazines / loose paper removed Cologne, aftershave, perfume and deodorant removed Jewelry removed (may wrap wedding band) Make-up removed NA Hair care products removed Battery operated devices (external) removed Heating patches and chemical warmers removed Titanium eyewear removed Nail polish cured greater than 10 hours NA Casting material cured greater than 10 hours NA Hearing aids removed NA Loose dentures or partials removed dentures removed Prosthetics have been removed NA Patient demonstrates correct use of air break device (if applicable) Patient concerns have been addressed Patient grounding bracelet on and cord attached to chamber Specifics for Inpatients (complete in addition to above) Medication sheet sent with patient NA Intravenous medications needed or due during therapy sent with patient NA Drainage tubes (e.g. nasogastric tube or chest tube secured and vented) NA Endotracheal or Tracheotomy tube secured NA Cuff deflated of air and inflated with saline NA Airway suctioned NA Notes Paper version used prior to treatment start. Electronic Signature(s) Signed: 09/30/2022 10:50:21 AM By: [name] CHT EMT BS , , Entered By: [name] on 09/30/2022 10:50:21

## 2022-10-01 ENCOUNTER — Encounter (HOSPITAL_BASED_OUTPATIENT_CLINIC_OR_DEPARTMENT_OTHER): Payer: Medicare HMO | Admitting: Internal Medicine

## 2022-10-01 DIAGNOSIS — M86172 Other acute osteomyelitis, left ankle and foot: Secondary | ICD-10-CM

## 2022-10-01 DIAGNOSIS — E11621 Type 2 diabetes mellitus with foot ulcer: Secondary | ICD-10-CM | POA: Diagnosis not present

## 2022-10-01 DIAGNOSIS — L97522 Non-pressure chronic ulcer of other part of left foot with fat layer exposed: Secondary | ICD-10-CM | POA: Diagnosis not present

## 2022-10-01 DIAGNOSIS — L97528 Non-pressure chronic ulcer of other part of left foot with other specified severity: Secondary | ICD-10-CM | POA: Diagnosis not present

## 2022-10-01 LAB — GLUCOSE, CAPILLARY
Glucose-Capillary: 227 mg/dL — ABNORMAL HIGH (ref 70–99)
Glucose-Capillary: 142 mg/dL — ABNORMAL HIGH (ref 70–99)

## 2022-10-01 NOTE — Progress Notes (Addendum)
[name] ([id number]) [id number] Page 1 of 2 Visit Report for 10/01/2022 HBO Details Patient Name: Date of Service: [name], [name] 10/01/2022 8:00 [name] Medical Record Number: [id number] Patient Account Number: [id number] Date of Birth/Sex: Treating RN: 1956-01-30 (67 y.o. [name] Primary Care Barbarita Hutmacher: [name] Other Clinician: [name] Referring Jaelyn Cloninger: Treating Nirvana Blanchett/Extender: [name] in Treatment: 24 HBO Treatment Course Details Treatment Course Number: 1 Ordering Niyati Heinke: [name] T Treatments Ordered: otal 80 HBO Treatment Start Date: 06/01/2022 HBO Indication: Diabetic Ulcer(s) of the Lower Extremity Standard/Conservative Wound Care tried and failed greater than or equal to 30 days HBO Treatment Details Treatment Number: 74 Patient Type: Outpatient Chamber Type: Monoplace Chamber Serial #: [id number] Treatment Protocol: 2.0 ATA with 90 minutes oxygen, and no air breaks Treatment Details Compression Rate Down: 1.0 psi / minute De-Compression Rate Up: 1.0 psi / minute Air breaks and breathing Decompress Decompress Compress Tx Pressure Begins Reached periods Begins Ends (leave unused spaces blank) Chamber Pressure (ATA 1 2 ------2 1 ) Clock Time (24 hr) 08:12 08:47 - - - - - - 10:17 10:34 Treatment Length: 142 (minutes) Treatment Segments: 5 Vital Signs Capillary Blood Glucose Reference Range: 80 - 120 mg / dl HBO Diabetic Blood Glucose Intervention Range: <131 mg/dl or >249 mg/dl Type: Time Vitals Blood Respiratory Capillary Blood Glucose Pulse Action Pulse: Temperature: Taken: Pressure: Rate: Glucose (mg/dl): Meter #: Oximetry (%) Taken: Pre 10:25 138/79 56 18 97.5 118 1 none per protocol Post 10:37 130/89 74 18 97.5 142 1 none per protocol Treatment Response Treatment Toleration: Well Treatment Completion Status: Treatment Completed without Adverse  Event Treatment Notes Upon arrival, [name] blood glucose level was measured at 227 mg/dL. He prepared for treatment. Other vital signs were within normal range. After performing [name] safety check, patient was placed in the chamber which was compressed at [name] rate of 1 psi/min until reaching approximately 5.5 psig [id number]). At that point, [name] complained of sinus pressure, describing it as "not painful, just annoying." Pressure was reduced to 3 psig. Patient experienced relief. He was able to tolerate travel until reaching 2 ATA at which time pressure was reduced until he felt relief than slowly increased back to 2 ATA. Dr. Heber [place] was made aware and patient was allowed to continue treatment at 2 ATA x 90 minutes without air breaks for today. Patient tolerated treatment well. He was able to tolerate decompression of the chamber at 1 psi/min without incident. Post treatment vital signs were within normal range. [name] denied any issues with ear equalization and/or pain. He was stable upon discharge. Additional Procedure Documentation Tissue Sevierity: Necrosis of bone Physician HBO Attestation: I certify that I supervised this HBO treatment in accordance with Medicare guidelines. [name] trained emergency response team is readily available per Yes hospital policies and procedures. Continue HBOT as ordered. Yes Electronic Signature(s) Signed: 10/02/2022 10:17:44 AM By: Elenor Legato ([id number]) AM By: [name] DO [phone].pdf Page 2 of 2 Signed: 10/02/2022 10:17:44 Previous Signature: 10/01/2022 2:56:47 PM Version By: [name] CHT EMT BS , , Previous Signature: 10/01/2022 3:02:48 PM Version By: [name] DO Previous Signature: 10/01/2022 2:47:00 PM Version By: [name] CHT EMT BS , , Entered By: [name] on 10/02/2022  10:16:36 -------------------------------------------------------------------------------- HBO Safety Checklist Details Patient Name: Date of Service: [name], [name] 10/01/2022 8:00 [name] Medical Record Number: [id number] Patient Account Number: [id number] Date  of Birth/Sex: Treating RN: October 13, 1955 (67 y.o. [name] Primary Care Chirag Krueger: [name] Other Clinician: [name] Referring Barre Aydelott: Treating Chanie Soucek/Extender: [name], [name] in Treatment: 24 HBO Safety Checklist Items Safety Checklist Consent Form Signed Patient voided / foley secured and emptied When did you last eato 0845 Last dose of injectable or oral agent self Ostomy pouch emptied and vented if applicable [name] All implantable devices assessed, documented and approved [name] Intravenous access site secured and place [name] Valuables secured Linens and cotton and cotton/polyester blend (less than 51% polyester) Personal oil-based products / skin lotions / body lotions removed Wigs or hairpieces removed [name] Smoking or tobacco materials removed [name] Books / newspapers / magazines / loose paper removed Cologne, aftershave, perfume and deodorant removed Jewelry removed (may wrap wedding band) Make-up removed [name] Hair care products removed Battery operated devices (external) removed Heating patches and chemical warmers removed Titanium eyewear removed Nail polish cured greater than 10 hours [name] Casting material cured greater than 10 hours [name] Hearing aids removed [name] Loose dentures or partials removed dentures removed Prosthetics have been removed [name] Patient demonstrates correct use of air break device (if applicable) Patient concerns have been addressed Patient grounding bracelet on and cord attached to chamber Specifics for Inpatients (complete in addition to above) Medication sheet sent with patient [name] Intravenous medications needed or due during therapy sent with  patient [name] Drainage tubes (e.g. nasogastric tube or chest tube secured and vented) [name] Endotracheal or Tracheotomy tube secured [name] Cuff deflated of air and inflated with saline [name] Airway suctioned [name] Notes Paper version used prior to treatment start. Electronic Signature(s) Signed: 10/01/2022 2:32:22 PM By: [name] CHT EMT BS , , Entered By: [name] on 10/01/2022 14:32:22

## 2022-10-01 NOTE — Progress Notes (Signed)
ESTELLE, HERSTON A ([id number]) [id number] Page 1 of 1 Visit Report for 10/01/2022 SuperBill Details Patient Name: Date of Service: [name] 10/01/2022 Medical Record Number: [id number] Patient Account Number: [id number] Date of Birth/Sex: Treating RN: December 07, 1955 (67 y.o. [name] Primary Care Provider: [name] Other Clinician: [name] Referring Provider: Treating Provider/Extender: [name] in Treatment: 24 Diagnosis Coding ICD-10 Codes Code Description [phone] Non-pressure chronic ulcer of other part of left foot with fat layer exposed M86.172 Other acute osteomyelitis, left ankle and foot L97.528 Non-pressure chronic ulcer of other part of left foot with other specified severity E11.621 Type 2 diabetes mellitus with foot ulcer E11.42 Type 2 diabetes mellitus with diabetic polyneuropathy S91.301A Unspecified open wound, right foot, initial encounter 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 severity Facility Procedures CPT4 Code Description Modifier Quantity [id number]-(Facility Use Only) HBOT full body chamber, 35min , 5 ICD-10 Diagnosis Description E11.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 M86.172 Other acute osteomyelitis, left ankle and foot Physician Procedures Quantity CPT4 Code Description Modifier [id number] - WC PHYS HYPERBARIC OXYGEN THERAPY 1 ICD-10 Diagnosis Description E11.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

## 2022-10-01 NOTE — Progress Notes (Signed)
[name], NORDMARK [name] ([id number]) [id number] Page 1 of 2 Visit Report for 10/01/2022 Arrival Information Details Patient Name: Date of Service: [name], [name]. 10/01/2022 8:00 [name] Medical Record Number: [id number] Patient Account Number: [id number] Date of Birth/Sex: Treating RN: Dec 12, 1955 (67 y.o. [name] Primary Care [name]: [name] Other Clinician: [name] Referring [name]: Treating [name]/[name] in Treatment: 24 Visit Information History Since Last Visit All ordered tests and consults were completed: Yes Patient Arrived: Wheel Chair Added or deleted any medications: No Arrival Time: 07:30 Any new allergies or adverse reactions: No Accompanied By: self Had [name] fall or experienced change in No Transfer Assistance: None activities of daily living that may affect Patient Identification Verified: Yes risk of falls: Secondary Verification Process Completed: Yes Signs or symptoms of abuse/neglect since last visito No Patient Requires Transmission-Based Precautions: No Hospitalized since last visit: No Patient Has Alerts: Yes Implantable device outside of the clinic excluding No cellular tissue based products placed in the center since last visit: Pain Present Now: No Electronic Signature(s) Signed: 10/01/2022 2:28:30 PM By: [name] CHT EMT BS , , Entered By: [name] on 10/01/2022 14:28:30 -------------------------------------------------------------------------------- Encounter Discharge Information Details Patient Name: Date of Service: [name], [name]. 10/01/2022 8:00 [name] Medical Record Number: [id number] Patient Account Number: [id number] Date of Birth/Sex: Treating RN: Dec 21, 1955 (67 y.o. [name] Primary Care [name]: [name] Other Clinician: [name] Referring [name]: Treating [name]/[name] in Treatment: 24 Encounter Discharge Information Items Discharge Condition: Stable Ambulatory Status: Wheelchair Discharge Destination: Home Transportation: Private Auto Accompanied By: self Schedule Follow-up Appointment: No Clinical Summary of Care: Electronic Signature(s) Signed: 10/01/2022 2:48:07 PM By: [name] CHT EMT BS , , Entered By: [name] on 10/01/2022 14:48:07 -------------------------------------------------------------------------------- [place] Details Patient Name: Date of Service: [name], [name]. 10/01/2022 8:00 [name] Medical Record Number: [id number] Patient Account Number: [id number] Date of Birth/Sex: Treating RN: 1955-11-12 (67 y.o. [name] Primary Care [name]: [name] Other Clinician: [name] Referring [name]: Treating [name]/Extender: [name], [name] in Treatment: 24 Vital Signs Time Taken: 10:25 Temperature (F): 97.5 [name], [name] ([id number]) [id number] Page 2 of 2 Height (in): 72 Pulse (bpm): 56 Weight (lbs): 220 Respiratory Rate (breaths/min): 18 Body Mass Index (BMI): 29.8 Blood Pressure (mmHg): 138/79 Capillary Blood Glucose (mg/dl): 118 Reference Range: 80 - 120 mg / dl Electronic Signature(s) Signed: 10/01/2022 2:35:11 PM By: [name] CHT EMT BS , , Previous Signature: 10/01/2022 2:30:25 PM Version By: [name] CHT EMT BS , , Entered By: [name] on 10/01/2022 14:35:10

## 2022-10-02 ENCOUNTER — Encounter (HOSPITAL_BASED_OUTPATIENT_CLINIC_OR_DEPARTMENT_OTHER): Payer: Medicare HMO | Admitting: Internal Medicine

## 2022-10-02 DIAGNOSIS — L97522 Non-pressure chronic ulcer of other part of left foot with fat layer exposed: Secondary | ICD-10-CM

## 2022-10-02 DIAGNOSIS — E11621 Type 2 diabetes mellitus with foot ulcer: Secondary | ICD-10-CM

## 2022-10-02 DIAGNOSIS — L97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 severity: Secondary | ICD-10-CM

## 2022-10-02 DIAGNOSIS — M86172 Other acute osteomyelitis, left ankle and foot: Secondary | ICD-10-CM | POA: Diagnosis not present

## 2022-10-02 DIAGNOSIS — L97528 Non-pressure chronic ulcer of other part of left foot with other specified severity: Secondary | ICD-10-CM

## 2022-10-02 LAB — GLUCOSE, CAPILLARY
Glucose-Capillary: 278 mg/dL — ABNORMAL HIGH (ref 70–99)
Glucose-Capillary: 173 mg/dL — ABNORMAL HIGH (ref 70–99)

## 2022-10-02 NOTE — Progress Notes (Addendum)
BRIGHTEN, LOVITT A ([id number]) [id number] Page 1 of 2 Visit Report for 10/02/2022 HBO Details Patient Name: Date of Service: [name], [name] 10/02/2022 8:00 A M Medical Record Number: [id number] Patient Account Number: [id number] Date of Birth/Sex: Treating RN: 02/22/1956 (67 y.o. [name], [name] Primary Care Tae Robak: [name] Other Clinician: [name] Referring Leiani Enright: Treating Jimmi Sidener/Extender: [name] in Treatment: 24 HBO Treatment Course Details Treatment Course Number: 1 Ordering Hydia Copelin: [name] T Treatments Ordered: otal 80 HBO Treatment Start Date: 06/01/2022 HBO Indication: Diabetic Ulcer(s) of the Lower Extremity Standard/Conservative Wound Care tried and failed greater than or equal to 30 days HBO Treatment Details Treatment Number: 75 Patient Type: Outpatient Chamber Type: Monoplace Chamber Serial #: [id number] Treatment Protocol: 2.5 ATA with 90 minutes oxygen, with two 5 minute air breaks Treatment Details Compression Rate Down: 1.0 psi / minute De-Compression Rate Up: 2.0 psi / minute A breaks and breathing ir Compress Tx Pressure periods Decompress Decompress Begins Reached (leave unused spaces Begins Ends blank) Chamber Pressure (ATA 1 2.5 2.5 2.5 2.5 2.5 - - 2.5 1 ) Clock Time (24 hr) 08:02 08:35 09:05 09:10 09:40 09:45 - - 10:15 10:26 Treatment Length: 144 (minutes) Treatment Segments: 5 Vital Signs Capillary Blood Glucose Reference Range: 80 - 120 mg / dl HBO Diabetic Blood Glucose Intervention Range: <131 mg/dl or >249 mg/dl Type: Time Vitals Blood Pulse: Respiratory Temperature: Capillary Blood Glucose Pulse Action Taken: Pressure: Rate: Glucose (mg/dl): Meter #: Oximetry (%) Taken: Pre 07:34 151/76 91 18 97.3 278 1 asymptomatic for hyperglycemia Post 10:43 138/88 72 18 97.3 173 1 98 none per protocol Treatment Response Treatment Toleration: Well Treatment  Completion Status: Treatment Completed without Adverse Event Treatment Notes Mr. Cramer arrived and his blood glucose level was 278 mg/dL. He denied any symptoms of hyperglycemia. All other vital signs were within normal range. He prepared for the treatment. After performing safety check, patient was placed in the chamber which was compressed with 100% oxygen at a rate of 1 psi/min. At 5.5 psig, patient complained of sinus pressure. Compression was reversed to 3 psig (0813) at which level he experienced relief. Compression continued, thereafter, at a rate of 1 psi/min with a few stops. He tolerated travel well. Mr. Oros tolerated treatment and subsequent travel at 2 psi/min. He denied any issues with ear equalization and/or pain without complaint of sinus pressure. He was stable when discharged for wound care appointment. Additional Procedure Documentation Tissue Sevierity: Necrosis of bone Electronic Signature(s) Signed: 10/02/2022 12:59:06 PM By: [name] CHT EMT BS , , Previous Signature: 10/02/2022 12:57:56 PM Version By: [name] CHT EMT BS , , Previous Signature: 10/02/2022 9:21:46 AM Version By: [name] CHT EMT BS , , Previous Signature: 10/02/2022 12:14:37 PM Version By: [name] DO Entered By: [name] on 10/02/2022 12:59:05 -------------------------------------------------------------------------------- HBO Safety Checklist Details Patient Name: Date of Service: [name], [place] [name] 10/02/2022 8:00 A M Medical Record Number: [id number] Patient Account Number: [id number] KYANDRE, CHLUDZINSKI ([id number]) [phone].pdf Page 2 of 2 Date of Birth/Sex: Treating RN: [birth year] (67 y.o. [name] Primary Care Zaide Kardell: Other Clinician: Ronelle Nigh Referring Regla Fitzgibbon: Treating Tayvion Lauder/Extender: [name] in Treatment: 24 HBO Safety Checklist Items Safety  Checklist Consent Form Signed Patient voided / foley secured and emptied When did you last eato 0700 Last dose of injectable or oral agent 0630 Ostomy pouch emptied and vented if applicable NA All implantable devices assessed,  documented and approved NA Intravenous access site secured and place NA Valuables secured Linens and cotton and cotton/polyester blend (less than 51% polyester) Personal oil-based products / skin lotions / body lotions removed Wigs or hairpieces removed NA Smoking or tobacco materials removed NA Books / newspapers / magazines / loose paper removed Cologne, aftershave, perfume and deodorant removed Jewelry removed (may wrap wedding band) Make-up removed NA Hair care products removed Battery operated devices (external) removed Heating patches and chemical warmers removed Titanium eyewear removed Nail polish cured greater than 10 hours NA Casting material cured greater than 10 hours NA Hearing aids removed NA Loose dentures or partials removed dentures removed Prosthetics have been removed NA Patient demonstrates correct use of air break device (if applicable) Patient concerns have been addressed Patient grounding bracelet on and cord attached to chamber Specifics for Inpatients (complete in addition to above) Medication sheet sent with patient NA Intravenous medications needed or due during therapy sent with patient NA Drainage tubes (e.g. nasogastric tube or chest tube secured and vented) NA Endotracheal or Tracheotomy tube secured NA Cuff deflated of air and inflated with saline NA Airway suctioned NA Notes Paper version used prior to treatment start. Electronic Signature(s) Signed: 10/02/2022 9:17:50 AM By: [name] CHT EMT BS , , Entered By: [name] on 10/02/2022 09:17:50

## 2022-10-02 NOTE — Progress Notes (Signed)
BARTOLOME, DUROCHER [name] ([id number]) [id number] Page 1 of 10 Visit Report for 10/02/2022 Arrival Information Details Patient Name: Date of Service: [name], [name]. 10/02/2022 10:15 [name] Medical Record Number: [id number] Patient Account Number: [id number] Date of Birth/Sex: Treating RN: January 06, 1956 (67 y.o. [name] Primary Care [name]: [name] Other Clinician: Referring [name]: Treating [name]/Extender: [name] in Treatment: 24 Visit Information History Since Last Visit Added or deleted any medications: No Patient Arrived: Wheel Chair Any new allergies or adverse reactions: No Arrival Time: 10:55 Had [name] fall or experienced change in No Accompanied By: self activities of daily living that may affect Transfer Assistance: Manual risk of falls: Patient Identification Verified: Yes Signs or symptoms of abuse/neglect since last visito No Secondary Verification Process Completed: Yes Hospitalized since last visit: No Patient Requires Transmission-Based Precautions: No Implantable device outside of the clinic excluding No Patient Has Alerts: Yes cellular tissue based products placed in the center since last visit: Has Dressing in Place as Prescribed: Yes Pain Present Now: No Electronic Signature(s) Unsigned Entered ByDeon [name] on 10/02/2022 11:04:20 -------------------------------------------------------------------------------- Encounter Discharge Information Details Patient Name: Date of Service: [name]. 10/02/2022 10:15 [name] Medical Record Number: [id number] Patient Account Number: [id number] Date of Birth/Sex: Treating RN: 10-10-1955 (67 y.o. [name] Primary Care [name]: [name] Other Clinician: Referring [name]: Treating [name]/Extender: [name] in Treatment: 24 Encounter Discharge Information Items Post Procedure Vitals Discharge  Condition: Stable Temperature (F): 97.3 Ambulatory Status: Wheelchair Pulse (bpm): 72 Discharge Destination: Home Respiratory Rate (breaths/min): 18 Transportation: Private Auto Blood Pressure (mmHg): 138/88 Accompanied By: self Schedule Follow-up Appointment: Yes Clinical Summary of Care: Electronic Signature(s) Unsigned Entered ByDeon [name] on 10/02/2022 11:49:20 -------------------------------------------------------------------------------- Lower Extremity Assessment Details Patient Name: Date of Service: [name]. 10/02/2022 10:15 [name] Medical Record Number: [id number] Patient Account Number: [id number] Date of Birth/Sex: Treating RN: 23-Oct-1955 (67 y.o. [name] Primary Care Bellamarie Pflug: [name] Other Clinician: Referring Felipa Laroche: Treating [name]/Extender: [name], [name] in Treatment: [address] [name] ([id number]) [id number] Page 2 of 10 Edema Assessment Assessed: [Left: Yes] [Right: Yes] Edema: [Left: No] [Right: No] Calf Left: Right: Point of Measurement: 36 cm From Medial Instep 39 cm 36 cm Ankle Left: Right: Point of Measurement: 9 cm From Medial Instep 22 cm 21 cm Vascular Assessment Pulses: Dorsalis Pedis Palpable: [Left:Yes] [Right:Yes] Electronic Signature(s) Unsigned Entered By: [name] on 10/02/2022 11:04:42 -------------------------------------------------------------------------------- Multi Wound Chart Details Patient Name: Date of Service: [name]. 10/02/2022 10:15 [name] Medical Record Number: [id number] Patient Account Number: [id number] Date of Birth/Sex: Treating RN: 11-Aug-1955 (67 y.o. M) Primary Care Risha Barretta: [name] Other Clinician: Referring Madia Carvell: Treating Kloie Whiting/Extender: [name], [name] in Treatment: 24 Vital Signs Height(in): 72 Capillary Blood Glucose(mg/dl): 173 Weight(lbs): 220 Pulse(bpm): 75 Body  Mass Index(BMI): 29.8 Blood Pressure(mmHg): 138/88 Temperature(F): 97.3 Respiratory Rate(breaths/min): 18 [5:Photos:] [[name]:[name]] Left, Lateral Foot Right Amputation Site - [name] Wound Location: Transmetatarsal Surgical Injury Gradually Appeared [name] Wounding Event: Diabetic Wound/Ulcer of the Lower Diabetic Wound/Ulcer of the Lower [name] Primary Etiology: Extremity Extremity Open Surgical Wound [name] [name] Secondary Etiology: Asthma, Sleep Apnea, Hypertension, Asthma, Sleep Apnea, Hypertension, [name] Comorbid History: Peripheral Venous Disease, Type II Peripheral Venous Disease, Type II Diabetes, Osteomyelitis, Neuropathy Diabetes, Osteomyelitis, Neuropathy 12/20/2021 07/16/2022 [name] Date Acquired: 24 11 [name] [name]: Open Open [name] Wound Status: No No [name]  Wound Recurrence: 2x0.2x0.5 0.1x0.1x0.4 [name] Measurements L x W x D (cm) 0.314 0.008 [name] [name] (cm) : rea 0.157 0.003 [name] Volume (cm) : 96.90% 99.20% [name] % Reduction in Area: 96.90% 99.70% [name] % Reduction in Volume: Grade 3 Grade 2 [name] Classification: [name], [name] ([id number]) [id number].pdf Page 3 of 10 Medium Small [name] Exudate Amount: Serosanguineous Serosanguineous [name] Exudate Type: red, brown red, brown [name] Exudate Color: Epibole Distinct, outline attached [name] Wound Margin: Large (67-100%) Large (67-100%) [name] Granulation Amount: Red Red [name] Granulation Quality: None Present (0%) None Present (0%) [name] Necrotic Amount: Fat Layer (Subcutaneous Tissue): Yes Fat Layer (Subcutaneous Tissue): Yes [name] Exposed Structures: Fascia: No Fascia: No Tendon: No Tendon: No Muscle: No Muscle: No Joint: No Joint: No Bone: No Bone: No Large (67-100%) Small (1-33%) [name] Epithelialization: Debridement - Excisional Debridement - Excisional [name] Debridement: Pre-procedure Verification/Time Out 11:30 11:30 [name] Taken: Lidocaine 5% topical ointment Lidocaine 5% topical ointment [name] Pain  Control: Callus, Subcutaneous, Slough Callus, Subcutaneous, Slough [name] Tissue Debrided: Skin/Subcutaneous Tissue Skin/Subcutaneous Tissue [name] Level: 3 2.5 [name] Debridement [name] (sq cm): rea Curette Curette [name] Instrument: Minimum Minimum [name] Bleeding: Pressure Pressure [name] Hemostasis [name] chieved: 0 0 [name] Procedural Pain: 0 0 [name] Post Procedural Pain: Procedure was tolerated well Procedure was tolerated well [name] Debridement Treatment Response: 2x0.2x0.4 0.4x0.3x0.3 [name] Post Debridement Measurements L x W x D (cm) 0.126 0.028 [name] Post Debridement Volume: (cm) Callus: Yes Callus: Yes [name] Periwound Skin Texture: Excoriation: No Excoriation: No Induration: No Induration: No Crepitus: No Crepitus: No Rash: No Rash: No Scarring: No Scarring: No Maceration: No Dry/Scaly: Yes [name] Periwound Skin Moisture: Dry/Scaly: No Maceration: No Atrophie Blanche: No Atrophie Blanche: No [name] Periwound Skin Color: Cyanosis: No Cyanosis: No Ecchymosis: No Ecchymosis: No Erythema: No Erythema: No Hemosiderin Staining: No Hemosiderin Staining: No Mottled: No Mottled: No Pallor: No Pallor: No Rubor: No Rubor: No No Abnormality No Abnormality [name] Temperature: Yes [name] [name] Tenderness on Palpation: Cellular or Tissue Based Product Cellular or Tissue Based Product [name] Procedures Performed: Debridement Debridement Treatment Notes Wound #5 (Foot) Wound Laterality: Left, Lateral Cleanser Wound Cleanser Discharge Instruction: Cleanse the wound with wound cleanser prior to applying [name] clean dressing using gauze sponges, not tissue or cotton balls. Peri-Wound Care Skin Prep Discharge Instruction: Use skin prep as directed Topical Primary Dressing Cutimed Sorbact Swab Discharge Instruction: Apply to wound bed behind grafix Grafix Discharge Instruction: applied by Ramces Shomaker. adaptic and steri-strips Discharge Instruction: to secure grafix to wound bed. leave in place. Secondary  Dressing ABD Pad, 8x10 Discharge Instruction: Apply over primary dressing as directed. Secured With [employer], 4.5x3.1 (in/yd) Discharge Instruction: Secure with Kerlix as directed. 80M Medipore H Soft Cloth Surgical Tape, 4 x 10 (in/yd) [name], [name] ([id number]) [phone].pdf Page 4 of 10 Discharge Instruction: Secure with tape as directed. Compression Wrap Compression Stockings Add-Ons Wound #6 (Amputation Site - Transmetatarsal) Wound Laterality: Right Cleanser Wound Cleanser Discharge Instruction: Cleanse the wound with wound cleanser prior to applying [name] clean dressing using gauze sponges, not tissue or cotton balls. Peri-Wound Care Skin Prep Discharge Instruction: Use skin prep as directed Topical Primary Dressing Grafix Discharge Instruction: applied by Dorna Mallet. adaptic and steri-strips Discharge Instruction: to secure grafix to wound bed. leave in place. cutimed sorbact Discharge Instruction: lightly pack into wound bed behind the grafix. Secondary Dressing ABD Pad, 8x10 Discharge Instruction: Apply over primary dressing as directed. Secured With [employer], 4.5x3.1 (in/yd) Discharge Instruction: Secure with Kerlix as directed. 80M Medipore H  Soft Cloth Surgical T ape, 4 x 10 (in/yd) Discharge Instruction: Secure with tape as directed. Compression Wrap Compression Stockings Add-Ons Electronic Signature(s) Signed: 10/02/2022 12:14:37 PM By: [name] DO Entered By: [name] on 10/02/2022 12:06:18 -------------------------------------------------------------------------------- Multi-Disciplinary Care Plan Details Patient Name: Date of Service: [name], [name] 10/02/2022 10:15 [name] Medical Record Number: [id number] Patient Account Number: [id number] Date of Birth/Sex: Treating RN: 02-11-1956 (67 y.o. [name] Primary Care Raziah Funnell: [name] Other Clinician: Referring [name]: Treating  Keinan Brouillet/Extender: [name] in Treatment: 24 Multidisciplinary Care Plan reviewed with physician Active Inactive Osteomyelitis Nursing Diagnoses: Infection: osteomyelitis Goals: Diagnostic evaluation for osteomyelitis completed as ordered Date Initiated: 04/23/2022 Target Resolution Date: 10/09/2022 [name], [name] ([id number]) [phone].pdf Page 5 of 10 Goal Status: Active Signs and symptoms for osteomyelitis will be recognized and promptly addressed Date Initiated: 04/23/2022 Target Resolution Date: 10/09/2022 Goal Status: Active Interventions: Assess for signs and symptoms of osteomyelitis resolution every visit Provide education on osteomyelitis Screen for HBO Treatment Activities: Biopsy : 04/16/2022 Consult for HBO : 04/23/2022 Surgical debridement : 04/23/2022 Systemic antibiotics : 04/23/2022 T ordered outside of clinic : 04/23/2022 est Notes: Wound/Skin Impairment Nursing Diagnoses: Knowledge deficit related to ulceration/compromised skin integrity Goals: Patient/caregiver will verbalize understanding of skin care regimen Date Initiated: 04/14/2022 Target Resolution Date: 10/09/2022 Goal Status: Active Interventions: Assess patient/caregiver ability to perform ulcer/skin care regimen upon admission and as needed Assess ulceration(s) every visit Provide education on ulcer and skin care Notes: Electronic Signature(s) Unsigned Entered By: [name] on 10/02/2022 11:27:23 -------------------------------------------------------------------------------- Pain Assessment Details Patient Name: Date of Service: [name]. 10/02/2022 10:15 [name] Medical Record Number: [id number] Patient Account Number: [id number] Date of Birth/Sex: Treating RN: 01-18-1956 (67 y.o. [name] Primary Care Tequlia Gonsalves: [name] Other Clinician: Referring Dereon Corkery: Treating Onetha Gaffey/Extender: [name] in Treatment: 24 Active Problems Location of Pain Severity and Description of Pain Patient Has Paino No Site Locations [name], [name] ([id number]) [id number] Page 6 of 10 Pain Management and Medication Current Pain Management: Electronic Signature(s) Unsigned Entered By: [name] on 10/02/2022 11:04:28 -------------------------------------------------------------------------------- Patient/Caregiver Education Details Patient Name: Date of Service: [name], [name] 3/29/2024andnbsp10:15 [place] Record Number: [id number] Patient Account Number: [id number] Date of Birth/Gender: Treating RN: [birth year] (67 y.o. [name] Primary Care Physician: [name] Other Clinician: Referring Physician: Treating Physician/Extender: [name] in Treatment: 24 Education Assessment Education Provided To: Patient Education Topics Provided Wound/Skin Impairment: Handouts: Caring for Your Ulcer Methods: Explain/Verbal Responses: Reinforcements needed Electronic Signature(s) Unsigned Entered By: [name] on 10/02/2022 11:31:42 -------------------------------------------------------------------------------- Wound Assessment Details Patient Name: Date of Service: [name]. 10/02/2022 10:15 [name] Medical Record Number: [id number] Patient Account Number: [id number] Date of Birth/Sex: Treating RN: September 20, 1955 (67 y.o. [name] Primary Care Sadiya Durand: [name] Other Clinician: Referring Ciani Rutten: Treating Sanjna Haskew/Extender: [name], [name] in Treatment: 24 Wound Status Wound Number: 5 Primary Diabetic Wound/Ulcer of the Lower Extremity Etiology: [name], [name] ([id number]) [phone].pdf Page 7 of 10 Etiology: Wound Location: Left, Lateral Foot Secondary Open Surgical Wound Wounding Event: Surgical Injury Etiology: Date Acquired:  12/20/2021 Wound Open [name]: 24 Status: Clustered Wound: No Comorbid Asthma, Sleep Apnea, Hypertension, Peripheral Venous Disease, History: Type II Diabetes, Osteomyelitis, Neuropathy Photos Wound Measurements Length: (cm) 2 Width: (cm) 0.2 Depth: (cm) 0.5 Area: (cm) 0.314 Volume: (cm) 0.157 % Reduction in Area:  96.9% % Reduction in Volume: 96.9% Epithelialization: Large (67-100%) Tunneling: No Undermining: No Wound Description Classification: Grade 3 Wound Margin: Epibole Exudate Amount: Medium Exudate Type: Serosanguineous Exudate Color: red, brown Foul Odor After Cleansing: No Slough/Fibrino No Wound Bed Granulation Amount: Large (67-100%) Exposed Structure Granulation Quality: Red Fascia Exposed: No Necrotic Amount: None Present (0%) Fat Layer (Subcutaneous Tissue) Exposed: Yes Tendon Exposed: No Muscle Exposed: No Joint Exposed: No Bone Exposed: No Periwound Skin Texture Texture Color No Abnormalities Noted: No No Abnormalities Noted: Yes Callus: Yes Temperature / Pain Crepitus: No Temperature: No Abnormality Excoriation: No Tenderness on Palpation: Yes Induration: No Rash: No Scarring: No Moisture No Abnormalities Noted: No Dry / Scaly: No Maceration: No Treatment Notes Wound #5 (Foot) Wound Laterality: Left, Lateral Cleanser Wound Cleanser Discharge Instruction: Cleanse the wound with wound cleanser prior to applying [name] clean dressing using gauze sponges, not tissue or cotton balls. Peri-Wound Care Skin Prep Discharge Instruction: Use skin prep as directed Topical Primary Dressing [name], [name] ([id number]) [id number] Page 8 of 10 Cutimed Sorbact Swab Discharge Instruction: Apply to wound bed behind grafix Grafix Discharge Instruction: applied by Denaya Horn. adaptic and steri-strips Discharge Instruction: to secure grafix to wound bed. leave in place. Secondary Dressing ABD Pad, 8x10 Discharge  Instruction: Apply over primary dressing as directed. Secured With [employer], 4.5x3.1 (in/yd) Discharge Instruction: Secure with Kerlix as directed. 53M Medipore H Soft Cloth Surgical T ape, 4 x 10 (in/yd) Discharge Instruction: Secure with tape as directed. Compression Wrap Compression Stockings Add-Ons Electronic Signature(s) Unsigned Entered By: [name] on 10/02/2022 11:08:50 -------------------------------------------------------------------------------- Wound Assessment Details Patient Name: Date of Service: [name], [name] 10/02/2022 10:15 [name] Medical Record Number: [id number] Patient Account Number: [id number] Date of Birth/Sex: Treating RN: February 05, 1956 (67 y.o. [name], [name] Primary Care Jahayra Mazo: [name] Other Clinician: Referring Leliana Kontz: Treating Teletha Petrea/Extender: [name], [name] in Treatment: 24 Wound Status Wound Number: 6 Primary Diabetic Wound/Ulcer of the Lower Extremity Etiology: Wound Location: Right Amputation Site - Transmetatarsal Wound Open Wounding Event: Gradually Appeared Status: Date Acquired: 07/16/2022 Comorbid Asthma, Sleep Apnea, Hypertension, Peripheral Venous Disease, [name]: 11 History: Type II Diabetes, Osteomyelitis, Neuropathy Clustered Wound: No Photos Wound Measurements Length: (cm) Width: (cm) Depth: (cm) Area: (cm) Volume: (cm) 0.1 % Reduction in Area: 99.2% 0.1 % Reduction in Volume: 99.7% 0.4 Epithelialization: Small (1-33%) 0.008 Tunneling: No 0.003 Undermining: No Wound Description Classification: Grade 2 Wound Margin: Distinct, outline attached YARETH, BAUTISTA [name] ([id number]) Exudate Amount: Small Exudate Type: Serosanguineous Exudate Color: red, brown Foul Odor After Cleansing: No Slough/Fibrino Yes [phone].pdf Page 9 of 10 Wound Bed Granulation Amount: Large (67-100%) Exposed Structure Granulation Quality: Red Fascia  Exposed: No Necrotic Amount: None Present (0%) Fat Layer (Subcutaneous Tissue) Exposed: Yes Tendon Exposed: No Muscle Exposed: No Joint Exposed: No Bone Exposed: No Periwound Skin Texture Texture Color No Abnormalities Noted: No No Abnormalities Noted: Yes Callus: Yes Temperature / Pain Crepitus: No Temperature: No Abnormality Excoriation: No Induration: No Rash: No Scarring: No Moisture No Abnormalities Noted: No Dry / Scaly: Yes Maceration: No Treatment Notes Wound #6 (Amputation Site - Transmetatarsal) Wound Laterality: Right Cleanser Wound Cleanser Discharge Instruction: Cleanse the wound with wound cleanser prior to applying [name] clean dressing using gauze sponges, not tissue or cotton balls. Peri-Wound Care Skin Prep Discharge Instruction: Use skin prep as directed Topical Primary Dressing Grafix Discharge Instruction: applied by Maleek Craver. adaptic and steri-strips Discharge Instruction: to secure grafix to wound bed. leave in place.  cutimed sorbact Discharge Instruction: lightly pack into wound bed behind the grafix. Secondary Dressing ABD Pad, 8x10 Discharge Instruction: Apply over primary dressing as directed. Secured With [employer], 4.5x3.1 (in/yd) Discharge Instruction: Secure with Kerlix as directed. 65M Medipore H Soft Cloth Surgical T ape, 4 x 10 (in/yd) Discharge Instruction: Secure with tape as directed. Compression Wrap Compression Stockings Add-Ons Electronic Signature(s) Unsigned Entered By: [name] on 10/02/2022 11:08:28 [name] ([id number].pdf Page 10 of 10 -------------------------------------------------------------------------------- Vitals Details Patient Name: Date of Service: [name], [name] 10/02/2022 10:15 [name] Medical Record Number: [id number] Patient Account Number: [id number] Date of Birth/Sex: Treating RN: 1955-08-06 (67 y.o. M) Primary Care Nusrat Encarnacion: [name]  Other Clinician: Referring Porfiria Heinrich: Treating Aliyyah Riese/Extender: [name], [name] in Treatment: 24 Vital Signs Time Taken: 10:43 Temperature (F): 97.3 Height (in): 72 Pulse (bpm): 72 Weight (lbs): 220 Respiratory Rate (breaths/min): 18 Body Mass Index (BMI): 29.8 Blood Pressure (mmHg): 138/88 Capillary Blood Glucose (mg/dl): 173 Reference Range: 80 - 120 mg / dl Airway Pulse Oximetry (%): 98 Electronic Signature(s) Signed: 10/02/2022 10:58:54 AM By: [name] CHT EMT BS , , Entered By: [name] on 10/02/2022 10:58:53

## 2022-10-02 NOTE — Progress Notes (Addendum)
BRAXSTON, BELLEW A ([id number]) [id number] Page 1 of 2 Visit Report for 10/02/2022 Arrival Information Details Patient Name: Date of Service: [name], [name] 10/02/2022 8:00 A M Medical Record Number: [id number] Patient Account Number: [id number] Date of Birth/Sex: Treating RN: 1956/05/15 (67 y.o. [name] Primary Care [name]: [name] Other Clinician: [name] Referring [name]: Treating Sahan Pen/Extender: [name] in Treatment: 24 Visit Information History Since Last Visit All ordered tests and consults were completed: Yes Patient Arrived: Wheel Chair Added or deleted any medications: No Arrival Time: 07:29 Any new allergies or adverse reactions: No Accompanied By: self Had a fall or experienced change in No Transfer Assistance: None activities of daily living that may affect Patient Identification Verified: Yes risk of falls: Secondary Verification Process Completed: Yes Signs or symptoms of abuse/neglect since last visito No Patient Requires Transmission-Based Precautions: No Hospitalized since last visit: No Patient Has Alerts: Yes Implantable device outside of the clinic excluding No cellular tissue based products placed in the center since last visit: Pain Present Now: No Electronic Signature(s) Signed: 10/02/2022 9:16:45 AM By: [name] CHT EMT BS , , Entered By: [name] on 10/02/2022 09:16:45 -------------------------------------------------------------------------------- Encounter Discharge Information Details Patient Name: Date of Service: [name], [name] 10/02/2022 8:00 A M Medical Record Number: [id number] Patient Account Number: [id number] Date of Birth/Sex: Treating RN: 22-Sep-1955 (67 y.o. [name] Primary Care [name]: [name] Other Clinician: [name] Referring Yoni Lobos: Treating Hurley Sobel/Extender: [name] in Treatment: 24 Encounter Discharge Information Items Discharge Condition: Stable Ambulatory Status: Wheelchair Discharge Destination: Other (Note Required) Transportation: Other Accompanied By: self Schedule Follow-up Appointment: No Clinical Summary of Care: Notes Wound care visit after HBOT today. Electronic Signature(s) Signed: 10/02/2022 1:00:55 PM By: [name] CHT EMT BS , , Previous Signature: 10/02/2022 12:59:39 PM Version By: [name] CHT EMT BS , , Entered By: [name] on 10/02/2022 13:00:55 -------------------------------------------------------------------------------- Vitals Details Patient Name: Date of Service: [name], [name] 10/02/2022 8:00 A M Medical Record Number: [id number] Patient Account Number: [id number] Date of Birth/Sex: Treating RN: [birth year] (67 y.o. [name] Primary Care Ajwa Kimberley: [name] Other Clinician: [name] Referring Jaishaun Mcnab: Treating Jode Lippe/Extender: [name] in Treatment: [address] A ([id number]) [id number] Page 2 of 2 Vital Signs Time Taken: 07:34 Temperature (F): 97.3 Height (in): 72 Pulse (bpm): 91 Weight (lbs): 220 Respiratory Rate (breaths/min): 18 Body Mass Index (BMI): 29.8 Blood Pressure (mmHg): 151/76 Capillary Blood Glucose (mg/dl): 278 Reference Range: 80 - 120 mg / dl Electronic Signature(s) Signed: 10/02/2022 9:17:07 AM By: [name] CHT EMT BS , , Entered By: [name] on 10/02/2022 09:17:06

## 2022-10-03 NOTE — Progress Notes (Signed)
[name] [name] ([id number]) [id number] Page 1 of 15 Visit Report for 10/02/2022 Chief Complaint Document Details Patient Name: Date of Service: [name], [name]. 10/02/2022 10:15 [name] Medical Record Number: [id number] Patient Account Number: [id number] Date of Birth/Sex: Treating RN: January 10, 1956 (67 y.o. M) Primary Care Provider: [name] Other Clinician: Referring Provider: Treating Provider/Extender: [name], [name] in Treatment: 24 Information Obtained from: Patient Chief Complaint 10/16/20; patient is here with [name] substantial wound on his right foot in the setting of [name] recent transmetatarsal amputation. 04/14/2022; referral from podiatry for postsurgical wound of nonhealing partial left fifth ray amputation, right foot (07/16/22): TMA dehiscence Electronic Signature(s) Signed: 10/02/2022 12:14:37 PM By: [name] DO Entered By: [name] on 10/02/2022 12:06:26 -------------------------------------------------------------------------------- Cellular or Tissue Based Product Details Patient Name: Date of Service: [name], [name]. 10/02/2022 10:15 [name] Medical Record Number: [id number] Patient Account Number: [id number] Date of Birth/Sex: Treating RN: [birth year] (67 y.o. [name] Primary Care Provider: [name] Other Clinician: Referring Provider: Treating Provider/Extender: [name] in Treatment: 24 Cellular or Tissue Based Product Type Wound #6 Right Amputation Site - Transmetatarsal Applied to: Performed By: Physician [name], DO Cellular or Tissue Based Product Type: Grafix prime Level of Consciousness (Pre-procedure): Awake and Alert Pre-procedure Verification/Time Out Yes - 11:43 Taken: Location: genitalia / hands / feet / multiple digits Wound Size (sq cm): 0.01 Product Size (sq cm): 1 Waste Size (sq cm): 1 Amount of Product Applied (sq cm):  0 Instrument Used: Forceps, Scissors Lot #: [id number] Order #: 7 Expiration Date: 05/23/2023 Fenestrated: No Reconstituted: Yes Solution Type: normal saline Solution Amount: 23mL Lot #: [id number] Expiration Date: 12/03/2024 Secured: Yes Secured With: Steri-Strips, adaptic Dressing Applied: Yes Primary Dressing: SORBACT SWAB Procedural Pain: 0 Post Procedural Pain: 0 Response to Treatment: Procedure was tolerated well Level of Consciousness (Post- Awake and Alert procedure): Post Procedure Diagnosis Same as Pre-procedure Electronic Signature(s) Signed: 10/02/2022 12:14:37 PM By: Elenor Legato ([id number] Page 2 of 15 Signed: 10/02/2022 4:18:03 PM By: [name] RN, [name] Entered By: [name] on 10/02/2022 11:44:05 -------------------------------------------------------------------------------- Cellular or Tissue Based Product Details Patient Name: Date of Service: [name], [name]. 10/02/2022 10:15 [name] Medical Record Number: [id number] Patient Account Number: [id number] Date of Birth/Sex: Treating RN: Mar 26, 1956 (67 y.o. [name] Primary Care Provider: [name] Other Clinician: Referring Provider: Treating Provider/Extender: [name] in Treatment: 24 Cellular or Tissue Based Product Type Wound #5 Left,Lateral Foot Applied to: Performed By: Physician [name], DO Cellular or Tissue Based Product Type: Grafix prime Level of Consciousness (Pre-procedure): Awake and Alert Pre-procedure Verification/Time Out Yes - 11:43 Taken: Location: genitalia / hands / feet / multiple digits Wound Size (sq cm): 0.4 Product Size (sq cm): 1 Waste Size (sq cm): 0 Amount of Product Applied (sq cm): 1 Instrument Used: Forceps, Scissors Lot #: [id number] Order #: 7 Expiration Date: 05/23/2023 Fenestrated: No Reconstituted: Yes Solution Type: normal  saline Solution Amount: 4mL Lot #: [id number] Expiration Date: 12/03/2024 Secured: Yes Secured With: Steri-Strips, adaptic Dressing Applied: Yes Primary Dressing: SORBACT SWAB Procedural Pain: 0 Post Procedural Pain: 0 Response to Treatment: Procedure was tolerated well Level of Consciousness (Post- Awake and Alert procedure): Post Procedure Diagnosis Same as Pre-procedure Electronic Signature(s) Signed: 10/02/2022 12:14:37 PM By: [name] DO Signed: 10/02/2022 4:18:03 PM By: [name] RN, [name] Entered By: [name] on  10/02/2022 11:44:37 -------------------------------------------------------------------------------- Debridement Details Patient Name: Date of Service: [name], [name]. 10/02/2022 10:15 [name] Medical Record Number: [id number] Patient Account Number: [id number] Date of Birth/Sex: Treating RN: 1956-02-14 (67 y.o. [name] Primary Care Provider: [name] Other Clinician: Referring Provider: Treating Provider/Extender: [name] in Treatment: 24 Debridement Performed for Assessment: Wound #6 Right Amputation Site - Transmetatarsal Performed By: Physician [name], DO Debridement Type: Debridement Severity of Tissue Pre Debridement: Fat layer exposed Level of Consciousness (Pre-procedure): Awake and Alert Pre-procedure Verification/Time Out Yes - 11:30 Taken: Start Time: 11:31 Pain Control: Lidocaine 5% topical ointment T Area Debrided (L x W): otal 2.5 (cm) x 1 (cm) = 2.5 (cm) [name], [name] ([id number]) [id number] Page 3 of 15 Tissue and other material debrided: Viable, Non-Viable, Callus, Slough, Subcutaneous, Skin: Dermis , Skin: Epidermis, Slough Level: Skin/Subcutaneous Tissue Debridement Description: Excisional Instrument: Curette Bleeding: Minimum Hemostasis Achieved: Pressure End Time: 11:38 Procedural Pain: 0 Post Procedural Pain: 0 Response to  Treatment: Procedure was tolerated well Level of Consciousness (Post- Awake and Alert procedure): Post Debridement Measurements of Total Wound Length: (cm) 0.4 Width: (cm) 0.3 Depth: (cm) 0.3 Volume: (cm) 0.028 Character of Wound/Ulcer Post Debridement: Improved Severity of Tissue Post Debridement: Fat layer exposed Post Procedure Diagnosis Same as Pre-procedure Electronic Signature(s) Signed: 10/02/2022 12:14:37 PM By: [name] DO Signed: 10/02/2022 4:18:03 PM By: [name] RN, [name] Entered By: [name] on 10/02/2022 11:40:45 -------------------------------------------------------------------------------- Debridement Details Patient Name: Date of Service: [name], [name]. 10/02/2022 10:15 [name] Medical Record Number: [id number] Patient Account Number: [id number] Date of Birth/Sex: Treating RN: 30-Apr-1956 (67 y.o. [name], [name] Primary Care Provider: [name] Other Clinician: Referring Provider: Treating Provider/Extender: [name] in Treatment: 24 Debridement Performed for Assessment: Wound #5 Left,Lateral Foot Performed By: Physician [name], DO Debridement Type: Debridement Severity of Tissue Pre Debridement: Fat layer exposed Level of Consciousness (Pre-procedure): Awake and Alert Pre-procedure Verification/Time Out Yes - 11:30 Taken: Start Time: 11:31 Pain Control: Lidocaine 5% topical ointment T Area Debrided (L x W): otal 3 (cm) x 1 (cm) = 3 (cm) Tissue and other material debrided: Viable, Non-Viable, Callus, Slough, Subcutaneous, Skin: Dermis , Skin: Epidermis, Slough Level: Skin/Subcutaneous Tissue Debridement Description: Excisional Instrument: Curette Bleeding: Minimum Hemostasis Achieved: Pressure End Time: 11:38 Procedural Pain: 0 Post Procedural Pain: 0 Response to Treatment: Procedure was tolerated well Level of Consciousness (Post- Awake and Alert procedure): Post Debridement Measurements  of Total Wound Length: (cm) 2 Width: (cm) 0.2 Depth: (cm) 0.4 Volume: (cm) 0.126 Character of Wound/Ulcer Post Debridement: Improved Severity of Tissue Post Debridement: Fat layer exposed Post Procedure Diagnosis Same as Pre-procedure [name], [name] ([id number]) [id number] Page 4 of 15 Electronic Signature(s) Signed: 10/02/2022 12:14:37 PM By: [name] DO Signed: 10/02/2022 4:18:03 PM By: [name] RN, [name] Entered By: [name] on 10/02/2022 11:41:22 -------------------------------------------------------------------------------- HPI Details Patient Name: Date of Service: [name], [name]. 10/02/2022 10:15 [name] Medical Record Number: [id number] Patient Account Number: [id number] Date of Birth/Sex: Treating RN: 1956/03/31 (67 y.o. M) Primary Care Provider: [name] Other Clinician: Referring Provider: Treating Provider/Extender: [name], [name] in Treatment: 24 History of Present Illness HPI Description: ADMISSION 10/16/20 This is [name] 67 year old man who is [name] type II diabetic. Initially seen by Dr. Unk Lightning of vein and vascular on 09/10/2021 for bilateral lower extremity rest pain right greater than left. His ABIs demonstrated monophasic waveforms at the  ankles bilaterally with depressed toe pressures. His ABI in the right was 0.5 and on the left 0.28. There were no obtainable waveforms for TBI's. He underwent angiography on 09/10/2021 and had findings of severe PAD with 95% stenosis of the distal SFA. He also had peroneal and posterior tibial arteries occluded with significant collateralization in the calf there was reconstitution of the posterior tibial artery in the distal third of the calf. Ostia of the anterior tibial artery was not appreciated. He underwent [name] angioplasty of the superficial femoral artery. He required admission to hospital from 09/12/2021 through 09/22/2021 with right foot cellulitis and sepsis. On  3/13 he underwent [name] right below-knee popliteal to posterior tibial bypass using [name] greater saphenous vein. Ultimately however he required [name] right TMA by podiatry which I believe was done on 3/17 for progressive diabetic foot infection. He was discharged to [facility] skilled facility. He arrives in clinic today with [name] large wound area over the entirety of his amputation site extending proximally. The attempted flap closure of the amputation site and his TMA has failed and the wound extends under the attempt to closure. Still some sutures in place. There is an odor but he is not systemically unwell. He is due for follow-up arterial studies and has an appointment with vascular surgery on 10/28/2021. They are using wet-to-dry dressings at [facility]. Past medical history includes type 2 diabetes with peripheral neuropathy, hypertension, obstructive sleep apnea 4/20; patient presents for follow-up. He is being discharged from his facility at [facility] today. They have been doing Dakin's wet-to-dry dressings. He states that his fiance can help with dressing changes. He is getting [facility] home [facility] to come out 3 times [name] week once he leaves the facility. He is scheduled to see vein and vascular on 5/12. He has not seen the wound himself. He puts covers over his face for wound exams. He currently denies systemic signs of infection. 4/28; patient admitted to the clinic 2 weeks ago with [name] dehisced right TMA in the setting of type 2 diabetes with significant PAD. He is status post revascularization. He has been discharged from the nursing home he was at and now is at home using Dakin's wet-to-dry dressings daily he has home health. He denies any systemic signs of infection 5/4; patient presents for follow-up. His fiance and home health have been changing his dressings. He currently denies systemic signs of infection. He continues to put sheet covers over his face during our wound encounters because he does not want  [name] look at the wound. 5/25; Patient presents for follow-up. He missed his last clinic appointment. He had [name] bone biopsy and culture done at last clinic visit that showed acute osteomyelitis with growth of E. coli, stenotrophomonas maltophilia and enteroccocus faecalis. [name] referral to infectious disease was made. He has not heard from them yet. He has been using Dakin's wet-to-dry dressings. He is now more comfortable with looking at the wound bed. 6/6; the patient comes into clinic today with 2 wounds on the left foot which apparently have been there for several months. This was not known to assess but was known by Dr. Donzetta Matters. In fact he underwent an angiogram yesterday. He had [name] laser atherectomy of the left posterior tibial artery with [name] 3 mm balloon angioplasty, also [name] stent of the last left SFA. 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 This is [name] lot better than the last time I saw it. He has been using Dakin's wet-to-dry. He has an appointment with infectious disease for [name] bone biopsy which showed E. coli, stenotrophomonas and Enterococcus faecalis. The appointment is for tomorrow 04/14/2022 Mr. Truth Bunkley ajuddin is an uncontrolled type 2 diabetic on insulin that presents with [name] left foot ulcer. On 12/18/2021 he required [name] left fifth ray amputation. He had revision of this site on 6/17 by Dr. Posey Pronto, podiatry. He states he has had [name] wound VAC on this area for the past 3 weeks. It is unclear what the prior wound care has been. He has [name] history of osteomyelitis to this area and was treated with IV and oral antibiotics For 6 weeks by Dr. Linus Salmons of infectious disease. At his last follow-up on 02/20/2022 his CRP was trending down and antibiotics were stopped. He also has [name] history of peripheral arterial disease status post stenting to the left SFA and  balloon angioplasty to the left posterior tibial artery On 12/2021. He has follow-up with vein and vascular at the end of the month. He also has [name] wound to the dorsal aspect of the left foot that he has been placing Betadine on. 10/19; patient presents for follow-up. He had [name] bone biopsy of the left foot at last clinic visit showed osteomyelitis. The bone culture showed Enterobacter cloacae, Enterococcus faecalis, Staphylococcus aureus, viridans group strep, actinotigum schaelii. He has been referred to infectious disease And has an appointment on October 25. He has been using Dakin's wet-to-dry dressing to the lateral left foot wound and Hydrofera Blue and Medihoney to the dorsal foot wound. He has no issues or complaints today. He denies systemic signs of infection. 10/26; patient presents for follow-up. He saw Dr. Linus Salmons yesterday with infectious disease and has been prescribed doxycycline and Levaquin for his chronic osteomyelitis. He has been using Dakin's wet-to-dry dressings to the lateral left foot wound and Medihoney and Hydrofera Blue to the dorsal wound. We discussed [name] skin graft for the dorsal foot wound and he would like to proceed with insurance verification. 11/3; patient with [name] wound on the dorsal left foot and [name] substantial postsurgical area on the lateral left foot fifth ray amputation. He is also followed by Dr. Alton Revere of infectious disease. He had [name] bone biopsy from the left lateral foot that showed acute osteomyelitis. Swab culture showed multiple organisms. He has also been revascularized by infectious disease status post left SFA stenting and tibial laser atherectomy and angioplasty. An MRI of the left foot had previously shown osteomyelitis of the fifth met head we have been using Dakin's wet-to-dry to the left lateral foot and Medihoney and Hydrofera Blue to the wound on the dorsal foot. 11/13; HBO treatment was discussed at last clinic visit and patient would like to proceed with  this. He has orientation today. He has been approved for Grafix. We have been using Hydrofera Blue and Medihoney to the left dorsal foot and collagen to the left lateral foot. Patient has home health that comes in for RASSAN, [name] ([id number]) [id number] Page 5 of 15 dressing changes. He currently has no issues or complaints today. 12/7; patient missed his last clinic appointment. He was last seen 3 weeks ago. He is currently off of antibiotics per ID. He completed [name] prolonged treatment. He has been using collagen to the lateral foot wound. He has been using Medihoney and Hydrofera Blue to  the dorsal foot wound. He has been doing well with hyperbaric oxygen therapy. He has no issues or complaints today. He denies signs of of infection. 12/14; patient presents for follow-up. We have been using Grafix to the wound beds. He has had trouble equalizing the pressure in his ears during HBO treatments. It was recommended he follow-up with ENT He does not have an appointment yet. He currently denies signs of infection. . 12/21; patient presents for follow-up. He was seen by ENT and had tubes placed. He is continued HBO without issues. We have been using Grafix to the wound beds. There is been improvement in wound healing. 12/21; patient presents for follow-up. He has no issues or complaints today. There is been improvement in wound healing. 1/5; patient presents for follow-up. At last clinic visit Grafix was placed in standard fashion. He has no issues or complaints today. He is tolerating hyperbaric oxygen therapy well. He denies signs of infection. 1/11; patient presents for follow-up. We have been placing Grafix to the wound beds on the left foot. The dorsal left foot wound has healed. The left lateral foot wound has shown improvement in healing. Unfortunately has had dehiscence of his previous surgical wound site on the right foot. He currently denies signs  of infection. 1/19; patient presents for follow-up. Grafix was placed to the left foot wound at last clinic visit. He has been using Dakin's wet-to-dry dressings to the right foot wound. He continues HBO therapy without issues. He has shown improvement in wound healing to the left foot. 1/26; patient presents for follow-up. We been placing Grafix to the left foot wound and patient has been using Dakin's wet-to-dry dressings to the right foot wound. There is been improvement in wound healing. He continues HBO therapy without issues. 2/2; patient presents for follow-up. We have been using Grafix to the left foot wound and Dakin's wet-to-dry dressings to the right foot wound. He has no issues or complaints today. 2/8; patient presents for follow-up. We have been using Grafix to the left foot wound and Dakin's wet-to-dry dressings to the right foot wound. He has no issues or complaints today. He continues HBO without issues. 2/15; patient presents for follow-up. We have been using Grafix to the wound beds. He continues HBO without issues. He has been offloading the wound beds with his surgical shoe. 2/22; patient presents for follow-up. We have been using Grafix to the wound beds. He has no issues or complaints today. He continues HBO therapy without issues. 2/29; patient presents for follow-up. We have been using Grafix to the wound beds. There is been improvement in wound healing. He has no issues or complaints today. He continues HBO therapy. 3/7; patient presents for follow-up. We have been using Grafix to the wound beds. Yesterday patient had an arteriogram to the right lower extremity with [name] drug-eluting stent placed in the superficial femoral artery. He has no issues to report on today. 3/14; patient presents for follow-up. We have been using Grafix to the wound beds. He continues to do HBO therapy without issues. 3/21; patient presents for follow-up. We have been using Grafix to the wound bed.  He continues to do HBO therapy. There continues to be improvement in wound healing. 3/29; patient presents for follow-up. We have been using Grafix to the wound beds. He continues to do HBO therapy. He has no issues or complaints today. There is been improvement in wound healing. Electronic Signature(s) Signed: 10/02/2022 12:14:37 PM By: [name] DO Entered By:  [name] on 10/02/2022 12:06:51 -------------------------------------------------------------------------------- Physical Exam Details Patient Name: Date of Service: [name], [name]. 10/02/2022 10:15 [name] Medical Record Number: [id number] Patient Account Number: [id number] Date of Birth/Sex: Treating RN: 1955/10/31 (67 y.o. M) Primary Care Provider: [name] Other Clinician: Referring Provider: Treating Provider/Extender: [name], [name] in Treatment: 24 Constitutional respirations regular, non-labored and within target range for patient.. Cardiovascular 2+ dorsalis pedis/posterior tibialis pulses. Psychiatric pleasant and cooperative. Notes Left foot: T the lateral aspect there is an open wound with granulation tissue and nonviable tissue. No probing to bone . Right foot: Along the previous TMA o site there is an open wound with nonviable tissue, granulation tissue and callus. Does not probe to bone. No signs of surrounding soft tissue infection. Electronic Signature(s) [name], [name] ([id number]) [id number] Page 6 of 15 Signed: 10/02/2022 12:14:37 PM By: [name] DO Entered By: [name] on 10/02/2022 12:08:17 -------------------------------------------------------------------------------- Physician Orders Details Patient Name: Date of Service: [name], [name]. 10/02/2022 10:15 [name] Medical Record Number: [id number] Patient Account Number: [id number] Date of Birth/Sex: Treating RN: [birth year] (67 y.o. [name], [name] Primary Care  Provider: [name] Other Clinician: Referring Provider: Treating Provider/Extender: [name] in Treatment: 24 Verbal / Phone Orders: No Diagnosis Coding ICD-10 Coding Code Description [phone] Non-pressure chronic ulcer of other part of left foot with fat layer exposed M86.172 Other acute osteomyelitis, left ankle and foot L97.528 Non-pressure chronic ulcer of other part of left foot with other specified severity E11.621 Type 2 diabetes mellitus with foot ulcer E11.42 Type 2 diabetes mellitus with diabetic polyneuropathy S91.301A Unspecified open wound, right foot, initial encounter 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 severity Follow-up Appointments ppointment in 1 week. - Dr. Celine [name] Covering (Dr. Heber Durand patient) Thursday 10/08/2022 1015 Return [name] ppointment in 2 weeks. - Dr. Heber Emory Friday 10/16/2022 1115 after Hyperbarics. Return [name] Other: - ****ONLY CHANGE OUTER DRESSINGS *** Anesthetic (In clinic) Topical Lidocaine 5% applied to wound bed Cellular or Tissue Based Products Cellular or Tissue Based Product Type: - Grafix # 3 applied 06/25/22 GRafix # 4 applied 07/02/22 Grafix # 5 applied 07/10/22 Grafix # 6 applied 07/17/22 Grafix # 7 applied 07/24/22---Also, go ahead and run IVR for more Grafix for left lateral foot Grafix #8 applied 07/31/2022 Grafix # 9 applied 08/07/22] left foot Grafix #10 applied 08/13/2022 left and right foot wounds today split the grafix in half. Graftx #11 applied to both wounds. Patient right foot approved for total of 12 between dates 08/18/2022-11/04/2022. Grafix #12 applied to both wounds. grafix #4 right foot split grafix and use to left foot #13. total can have 24. Grafix # 5 applied to right foot split grafix and use to left foot # 14. Grafix #6 right foot and Grafix #16 to left foot applied. Grafix #7 right foot and Grafix #17 left foot applied. Cellular or Tissue Based  Product applied to wound bed, secured with steri-strips, cover with Adaptic or Mepitel. (DO NOT REMOVE). Edema Control - Lymphedema / SCD / Other Avoid standing for long periods of time. Off-Loading Open toe surgical shoe to: - wear when standing or walking. Hyperbaric Oxygen Therapy Evaluate for HBO Therapy - 07/24/22- Pt. to finish next 8 HBO treatments 07/24/22- DR. Mulan Adan wants pt. to be extended for 40 more treatments Indication: - Wagner Grade III to left lateral foot 2.5 ATA for 90 Minutes with 2 Five (5) Minute [name]  Breaks ir Total Number of Treatments: - 40; extend for 40 more= 80 treatments One treatments per day (delivered Monday through Friday unless otherwise specified in Special Instructions below): Finger stick Blood Glucose Pre- and Post- HBOT Treatment. Follow Hyperbaric Oxygen Glycemia Protocol Afrin (Oxymetazoline HCL) 0.05% nasal spray - 1 spray in both nostrils daily as needed prior to HBO treatment for difficulty clearing ears Wound Treatment Wound #5 - Foot Wound Laterality: Left, Lateral Cleanser: Wound Cleanser (Generic) 1 x Per Day/30 Days Discharge Instructions: Cleanse the wound with wound cleanser prior to applying [name] clean dressing using gauze sponges, not tissue or cotton balls. Peri-Wound Care: Skin Prep (Generic) 1 x Per Day/30 Days Discharge Instructions: Use skin prep as directed [name], [name] ([id number]) [id number].pdf Page 7 of 15 Prim Dressing: Cutimed Sorbact Swab 1 x Per Day/30 Days ary Discharge Instructions: Apply to wound bed behind grafix Prim Dressing: Grafix (Generic) 1 x Per Day/30 Days ary Discharge Instructions: applied by provider. Prim Dressing: adaptic and steri-strips 1 x Per Day/30 Days ary Discharge Instructions: to secure grafix to wound bed. leave in place. Secondary Dressing: ABD Pad, 8x10 (Generic) 1 x Per Day/30 Days Discharge Instructions: Apply over primary dressing as directed. Secured With:  [employer], 4.5x3.1 (in/yd) (Generic) 1 x Per Day/30 Days Discharge Instructions: Secure with Kerlix as directed. Secured With: 72M Medipore H Soft Cloth Surgical T ape, 4 x 10 (in/yd) (Generic) 1 x Per Day/30 Days Discharge Instructions: Secure with tape as directed. Wound #6 - Amputation Site - Transmetatarsal Wound Laterality: Right Cleanser: Wound Cleanser (Generic) 1 x Per Day/30 Days Discharge Instructions: Cleanse the wound with wound cleanser prior to applying [name] clean dressing using gauze sponges, not tissue or cotton balls. Peri-Wound Care: Skin Prep (Generic) 1 x Per Day/30 Days Discharge Instructions: Use skin prep as directed Prim Dressing: Grafix (Generic) 1 x Per Day/30 Days ary Discharge Instructions: applied by provider. Prim Dressing: adaptic and steri-strips 1 x Per Day/30 Days ary Discharge Instructions: to secure grafix to wound bed. leave in place. Prim Dressing: cutimed sorbact 1 x Per Day/30 Days ary Discharge Instructions: lightly pack into wound bed behind the grafix. Secondary Dressing: ABD Pad, 8x10 (Generic) 1 x Per Day/30 Days Discharge Instructions: Apply over primary dressing as directed. Secured With: [employer], 4.5x3.1 (in/yd) (Generic) 1 x Per Day/30 Days Discharge Instructions: Secure with Kerlix as directed. Secured With: 72M Medipore H Soft Cloth Surgical T ape, 4 x 10 (in/yd) (Generic) 1 x Per Day/30 Days Discharge Instructions: Secure with tape as directed. GLYCEMIA INTERVENTIONS PROTOCOL PRE-HBO GLYCEMIA INTERVENTIONS ACTION INTERVENTION Obtain pre-HBO capillary blood glucose (ensure 1 physician order is in chart). [name]. Notify HBO physician and await physician orders. 2 If result is 70 mg/dl or below: B. If the result meets the hospital definition of [name] critical result, follow hospital policy. [name]. Give patient an 8 ounce Glucerna Shake, an 8 ounce Ensure, or 8 ounces of [name] Glucerna/Ensure equivalent dietary supplement*. B.  Wait 30 minutes. If result is 71 mg/dl to 130 mg/dl: C. Retest patients capillary blood glucose (CBG). D. If result greater than or equal to 110 mg/dl, proceed with HBO. If result less than 110 mg/dl, notify HBO physician and consider holding HBO. If result is 131 mg/dl to 249 mg/dl: [name]. Proceed with HBO. [name]. Notify HBO physician and await physician orders. B. It is recommended to hold HBO and do If result is 250 mg/dl or greater: blood/urine ketone testing. C. If the result meets the  hospital definition of [name] critical result, follow hospital policy. POST-HBO GLYCEMIA INTERVENTIONS ACTION INTERVENTION Obtain post HBO capillary blood glucose (ensure 1 physician order is in chart). [name]. Notify HBO physician and await physician orders. 2 If result is 70 mg/dl or below: B. If the result meets the hospital definition of [name] critical result, follow hospital policy. [name]. Give patient an 8 ounce Glucerna Shake, an 8 ounce Ensure, or 8 ounces of [name], [name] ([id number]) [phone].pdf Page 8 of 15 Glucerna/Ensure equivalent dietary supplement*. B. Wait 15 minutes for symptoms of If result is 71 mg/dl to 100 mg/dl: hypoglycemia (i.e. nervousness, anxiety, sweating, chills, clamminess, irritability, confusion, tachycardia or dizziness). C. If patient asymptomatic, discharge patient. If patient symptomatic, repeat capillary blood glucose (CBG) and notify HBO physician. If result is 101 mg/dl to 249 mg/dl: [name]. Discharge patient. [name]. Notify HBO physician and await physician orders. B. It is recommended to do blood/urine ketone If result is 250 mg/dl or greater: testing. C. If the result meets the hospital definition of [name] critical result, follow hospital policy. *Juice or candies are NOT equivalent products. If patient refuses the Glucerna or Ensure, please consult the hospital dietitian for an appropriate substitute. Electronic Signature(s) Signed:  10/02/2022 12:14:37 PM By: [name] DO Entered By: [name] on 10/02/2022 12:08:25 -------------------------------------------------------------------------------- Problem List Details Patient Name: Date of Service: [name], [name]. 10/02/2022 10:15 [name] Medical Record Number: [id number] Patient Account Number: [id number] Date of Birth/Sex: Treating RN: 10/13/1955 (68 y.o. [name], [name] Primary Care Provider: [name] Other Clinician: Referring Provider: Treating Provider/Extender: [name] in Treatment: 24 Active Problems ICD-10 Encounter Code Description Active Date MDM Diagnosis [phone] Non-pressure chronic ulcer of other part of left foot with fat layer exposed 04/14/2022 No Yes M86.172 Other acute osteomyelitis, left ankle and foot 05/08/2022 No Yes L97.528 Non-pressure chronic ulcer of other part of left foot with other specified 04/14/2022 No Yes severity E11.621 Type 2 diabetes mellitus with foot ulcer 04/14/2022 No Yes E11.42 Type 2 diabetes mellitus with diabetic polyneuropathy 04/14/2022 No Yes S91.301A Unspecified open wound, right foot, initial encounter 07/16/2022 No Yes 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 07/16/2022 No Yes severity Inactive Problems Resolved Problems Electronic Signature(s) KASAAN, SPAMPINATO [name] ([id number]) [id number] Page 9 of 15 Signed: 10/02/2022 12:14:37 PM By: [name] DO Entered By: [name] on 10/02/2022 12:06:11 -------------------------------------------------------------------------------- Progress Note Details Patient Name: Date of Service: [name], [name]. 10/02/2022 10:15 [name] Medical Record Number: [id number] Patient Account Number: [id number] Date of Birth/Sex: Treating RN: [birth year] (67 y.o. M) Primary Care Provider: [name] Other Clinician: Referring Provider: Treating  Provider/Extender: [name], [name] in Treatment: 24 Subjective Chief Complaint Information obtained from Patient 10/16/20; patient is here with [name] substantial wound on his right foot in the setting of [name] recent transmetatarsal amputation. 04/14/2022; referral from podiatry for postsurgical wound of nonhealing partial left fifth ray amputation, right foot (07/16/22): TMA dehiscence History of Present Illness (HPI) ADMISSION 10/16/20 This is [name] 67 year old man who is [name] type II diabetic. Initially seen by Dr. Unk Lightning of vein and vascular on 09/10/2021 for bilateral lower extremity rest pain right greater than left. His ABIs demonstrated monophasic waveforms at the ankles bilaterally with depressed toe pressures. His ABI in the right was 0.5 and on the left 0.28. There were no obtainable waveforms for TBI's. He underwent angiography on 09/10/2021 and had findings of severe PAD  with 95% stenosis of the distal SFA. He also had peroneal and posterior tibial arteries occluded with significant collateralization in the calf there was reconstitution of the posterior tibial artery in the distal third of the calf. Ostia of the anterior tibial artery was not appreciated. He underwent [name] angioplasty of the superficial femoral artery. He required admission to hospital from 09/12/2021 through 09/22/2021 with right foot cellulitis and sepsis. On 3/13 he underwent [name] right below-knee popliteal to posterior tibial bypass using [name] greater saphenous vein. Ultimately however he required [name] right TMA by podiatry which I believe was done on 3/17 for progressive diabetic foot infection. He was discharged to [facility] skilled facility. He arrives in clinic today with [name] large wound area over the entirety of his amputation site extending proximally. The attempted flap closure of the amputation site and his TMA has failed and the wound extends under the attempt to closure. Still some sutures in place. There is an odor  but he is not systemically unwell. He is due for follow-up arterial studies and has an appointment with vascular surgery on 10/28/2021. They are using wet-to-dry dressings at [facility]. Past medical history includes type 2 diabetes with peripheral neuropathy, hypertension, obstructive sleep apnea 4/20; patient presents for follow-up. He is being discharged from his facility at [facility] today. They have been doing Dakin's wet-to-dry dressings. He states that his fiance can help with dressing changes. He is getting [facility] to come out 3 times [name] week once he leaves the facility. He is scheduled to see vein and vascular on 5/12. He has not seen the wound himself. He puts covers over his face for wound exams. He currently denies systemic signs of infection. 4/28; patient admitted to the clinic 2 weeks ago with [name] dehisced right TMA in the setting of type 2 diabetes with significant PAD. He is status post revascularization. He has been discharged from the nursing home he was at and now is at home using Dakin's wet-to-dry dressings daily he has home health. He denies any systemic signs of infection 5/4; patient presents for follow-up. His fiance and home health have been changing his dressings. He currently denies systemic signs of infection. He continues to put sheet covers over his face during our wound encounters because he does not want [name] look at the wound. 5/25; Patient presents for follow-up. He missed his last clinic appointment. He had [name] bone biopsy and culture done at last clinic visit that showed acute osteomyelitis with growth of E. coli, stenotrophomonas maltophilia and enteroccocus faecalis. [name] referral to infectious disease was made. He has not heard from them yet. He has been using Dakin's wet-to-dry dressings. He is now more comfortable with looking at the wound bed. 6/6; the patient comes into clinic today with 2 wounds on the left foot which apparently have been there for  several months. This was not known to assess but was known by Dr. Donzetta Matters. In fact he underwent an angiogram yesterday. He had [name] laser atherectomy of the left posterior tibial artery with [name] 3 mm balloon angioplasty, also [name] stent of the last left SFA. 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 This is [name] lot better than the last time I saw it. He has been using Dakin's wet-to-dry. He has an appointment with infectious disease for [name]  bone biopsy which showed E. coli, stenotrophomonas and Enterococcus faecalis. The appointment is for tomorrow 04/14/2022 Mr. Arvon Lewellyn ajuddin is an uncontrolled type 2 diabetic on insulin that presents with [name] left foot ulcer. On 12/18/2021 he required [name] left fifth ray amputation. He had revision of this site on 6/17 by Dr. Posey Pronto, podiatry. He states he has had [name] wound VAC on this area for the past 3 weeks. It is unclear what the prior wound care has been. He has [name] history of osteomyelitis to this area and was treated with IV and oral antibiotics For 6 weeks by Dr. Linus Salmons of infectious disease. At his last follow-up on 02/20/2022 his CRP was trending down and antibiotics were stopped. He also has [name] history of peripheral arterial disease status post stenting to the left SFA and balloon angioplasty to the left posterior tibial artery On 12/2021. He has follow-up with vein and vascular at the end of the month. He also has [name] wound to the dorsal aspect of the left foot that he has been placing Betadine on. 10/19; patient presents for follow-up. He had [name] bone biopsy of the left foot at last clinic visit showed osteomyelitis. The bone culture showed Enterobacter cloacae, Enterococcus faecalis, Staphylococcus aureus, viridans group strep, actinotigum schaelii. He has been referred to infectious disease And has an appointment on October  25. He has been using Dakin's wet-to-dry dressing to the lateral left foot wound and Hydrofera Blue and Medihoney to the dorsal foot wound. He has no issues or complaints today. He denies systemic signs of infection. 10/26; patient presents for follow-up. He saw Dr. Linus Salmons yesterday with infectious disease and has been prescribed doxycycline and Levaquin for his chronic osteomyelitis. He has been using Dakin's wet-to-dry dressings to the lateral left foot wound and Medihoney and Hydrofera Blue to the dorsal wound. We discussed [name] skin graft for the dorsal foot wound and he would like to proceed with insurance verification. 11/3; patient with [name] wound on the dorsal left foot and [name] substantial postsurgical area on the lateral left foot fifth ray amputation. He is also followed by Dr. Alton Revere of infectious disease. He had [name] bone biopsy from the left lateral foot that showed acute osteomyelitis. Swab culture showed multiple organisms. He has also been revascularized by infectious disease status post left SFA stenting and tibial laser atherectomy and angioplasty. An MRI of the left foot had previously shown osteomyelitis of the fifth met head we have been using Dakin's wet-to-dry to the left lateral foot and Medihoney and Hydrofera Blue to the wound on the dorsal foot. [name], [name] ([id number]) [id number] Page 10 of 15 11/13; HBO treatment was discussed at last clinic visit and patient would like to proceed with this. He has orientation today. He has been approved for Grafix. We have been using Hydrofera Blue and Medihoney to the left dorsal foot and collagen to the left lateral foot. Patient has home health that comes in for dressing changes. He currently has no issues or complaints today. 12/7; patient missed his last clinic appointment. He was last seen 3 weeks ago. He is currently off of antibiotics per ID. He completed [name] prolonged treatment. He has been using collagen to  the lateral foot wound. He has been using Medihoney and Hydrofera Blue to the dorsal foot wound. He has been doing well with hyperbaric oxygen therapy. He has no issues or complaints today. He denies signs of of infection. 12/14; patient presents for follow-up. We have been using Grafix  to the wound beds. He has had trouble equalizing the pressure in his ears during HBO treatments. It was recommended he follow-up with ENT He does not have an appointment yet. He currently denies signs of infection. . 12/21; patient presents for follow-up. He was seen by ENT and had tubes placed. He is continued HBO without issues. We have been using Grafix to the wound beds. There is been improvement in wound healing. 12/21; patient presents for follow-up. He has no issues or complaints today. There is been improvement in wound healing. 1/5; patient presents for follow-up. At last clinic visit Grafix was placed in standard fashion. He has no issues or complaints today. He is tolerating hyperbaric oxygen therapy well. He denies signs of infection. 1/11; patient presents for follow-up. We have been placing Grafix to the wound beds on the left foot. The dorsal left foot wound has healed. The left lateral foot wound has shown improvement in healing. Unfortunately has had dehiscence of his previous surgical wound site on the right foot. He currently denies signs of infection. 1/19; patient presents for follow-up. Grafix was placed to the left foot wound at last clinic visit. He has been using Dakin's wet-to-dry dressings to the right foot wound. He continues HBO therapy without issues. He has shown improvement in wound healing to the left foot. 1/26; patient presents for follow-up. We been placing Grafix to the left foot wound and patient has been using Dakin's wet-to-dry dressings to the right foot wound. There is been improvement in wound healing. He continues HBO therapy without issues. 2/2; patient presents for  follow-up. We have been using Grafix to the left foot wound and Dakin's wet-to-dry dressings to the right foot wound. He has no issues or complaints today. 2/8; patient presents for follow-up. We have been using Grafix to the left foot wound and Dakin's wet-to-dry dressings to the right foot wound. He has no issues or complaints today. He continues HBO without issues. 2/15; patient presents for follow-up. We have been using Grafix to the wound beds. He continues HBO without issues. He has been offloading the wound beds with his surgical shoe. 2/22; patient presents for follow-up. We have been using Grafix to the wound beds. He has no issues or complaints today. He continues HBO therapy without issues. 2/29; patient presents for follow-up. We have been using Grafix to the wound beds. There is been improvement in wound healing. He has no issues or complaints today. He continues HBO therapy. 3/7; patient presents for follow-up. We have been using Grafix to the wound beds. Yesterday patient had an arteriogram to the right lower extremity with [name] drug-eluting stent placed in the superficial femoral artery. He has no issues to report on today. 3/14; patient presents for follow-up. We have been using Grafix to the wound beds. He continues to do HBO therapy without issues. 3/21; patient presents for follow-up. We have been using Grafix to the wound bed. He continues to do HBO therapy. There continues to be improvement in wound healing. 3/29; patient presents for follow-up. We have been using Grafix to the wound beds. He continues to do HBO therapy. He has no issues or complaints today. There is been improvement in wound healing. Patient History Information obtained from Patient, Chart. Family History Diabetes - Mother,Siblings, Heart Disease - Siblings, Hypertension - Siblings, Stroke - Mother, No family history of Cancer, Hereditary Spherocytosis, Kidney Disease, Lung Disease, Seizures, Thyroid  Problems, Tuberculosis. Social History Former smoker, Marital Status - Single, Alcohol Use -  Never, Drug Use - No History, Caffeine Use - Daily. Medical History Eyes Denies history of Cataracts, Glaucoma, Optic Neuritis Respiratory Patient has history of Asthma, Sleep Apnea Denies history of Aspiration, Chronic Obstructive Pulmonary Disease (COPD), Pneumothorax, Tuberculosis Cardiovascular Patient has history of Hypertension, Peripheral Venous Disease Denies history of Angina, Arrhythmia, Congestive Heart Failure, Coronary Artery Disease, Deep Vein Thrombosis, Hypotension, Myocardial Infarction, Peripheral Arterial Disease, Phlebitis, Vasculitis Endocrine Patient has history of Type II Diabetes Denies history of Type I Diabetes Integumentary (Skin) Denies history of History of Burn Musculoskeletal Patient has history of Osteomyelitis Denies history of Gout, Rheumatoid Arthritis, Osteoarthritis Neurologic Patient has history of Neuropathy Denies history of Dementia, Quadriplegia, Paraplegia, Seizure Disorder Hospitalization/Surgery History - 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 Posey Pronto. - aortogram 12/08/2021 Dr. Virl Cagey [facility]. Medical [name] Surgical History Notes nd [name], [name] ([id number]) [id number] Page 11 of 15 Psychiatric PTSD Objective Constitutional respirations regular, non-labored and within target range for patient.. Vitals Time Taken: 10:43 AM, Height: 72 in, Weight: 220 lbs, BMI: 29.8, Temperature: 97.3 F, Pulse: 72 bpm, Respiratory Rate: 18 breaths/min, Blood Pressure: 138/88 mmHg, Capillary Blood Glucose: 173 mg/dl, Pulse Oximetry: 98 %. Cardiovascular 2+ dorsalis pedis/posterior tibialis pulses. Psychiatric pleasant and cooperative. General Notes: Left foot: T the lateral aspect there is an open wound with granulation tissue and nonviable tissue. No probing to bone . Right foot: Along the o previous TMA site  there is an open wound with nonviable tissue, granulation tissue and callus. Does not probe to bone. No signs of surrounding soft tissue infection. Integumentary (Hair, Skin) Wound #5 status is Open. Original cause of wound was Surgical Injury. The date acquired was: 12/20/2021. The wound has been in treatment 24 weeks. The wound is located on the Left,Lateral Foot. The wound measures 2cm length x 0.2cm width x 0.5cm depth; 0.314cm^2 area and 0.157cm^3 volume. There is Fat Layer (Subcutaneous Tissue) exposed. There is no tunneling or undermining noted. There is [name] medium amount of serosanguineous drainage noted. The wound margin is epibole. There is large (67-100%) red granulation within the wound bed. There is no necrotic tissue within the wound bed. The periwound skin appearance had no abnormalities noted for color. The periwound skin appearance exhibited: Callus. The periwound skin appearance did not exhibit: Crepitus, Excoriation, Induration, Rash, Scarring, Dry/Scaly, Maceration. Periwound temperature was noted as No Abnormality. The periwound has tenderness on palpation. Wound #6 status is Open. Original cause of wound was Gradually Appeared. The date acquired was: 07/16/2022. The wound has been in treatment 11 weeks. The wound is located on the Right Amputation Site - Transmetatarsal. The wound measures 0.1cm length x 0.1cm width x 0.4cm depth; 0.008cm^2 area and 0.003cm^3 volume. There is Fat Layer (Subcutaneous Tissue) exposed. There is no tunneling or undermining noted. There is [name] small amount of serosanguineous drainage noted. The wound margin is distinct with the outline attached to the wound base. There is large (67-100%) red granulation within the wound bed. There is no necrotic tissue within the wound bed. The periwound skin appearance had no abnormalities noted for color. The periwound skin appearance exhibited: Callus, Dry/Scaly. The periwound skin appearance did not exhibit: Crepitus,  Excoriation, Induration, Rash, Scarring, Maceration. Periwound temperature was noted as No Abnormality. Assessment Active Problems ICD-10 Non-pressure chronic ulcer of other part of left foot with fat layer exposed Other acute osteomyelitis, left ankle and foot Non-pressure chronic ulcer of other part of left foot with other specified severity Type 2 diabetes mellitus with foot ulcer  Type 2 diabetes mellitus with diabetic polyneuropathy Unspecified open wound, right foot, initial encounter 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 severity Patient's wounds appear well-healing. I debrided nonviable tissue. Grafix was placed in standard fashion to the feet wounds bilaterally. No signs of infection. Continue aggressive offloading with surgical shoe. Follow-up in 1 week. Procedures Wound #5 Pre-procedure diagnosis of Wound #5 is [name] Diabetic Wound/Ulcer of the Lower Extremity located on the Left,Lateral Foot .Severity of Tissue Pre Debridement is: Fat layer exposed. There was [name] Excisional Skin/Subcutaneous Tissue Debridement with [name] total area of 3 sq cm performed by [name], DO. With the following instrument(s): Curette to remove Viable and Non-Viable tissue/material. Material removed includes Callus, Subcutaneous Tissue, Slough, Skin: Dermis, and Skin: Epidermis after achieving pain control using Lidocaine 5% topical ointment. [name] time out was conducted at 11:30, prior to the start of the procedure. [name] Minimum amount of bleeding was controlled with Pressure. The procedure was tolerated well with [name] pain level of 0 throughout and [name] pain level of 0 following the procedure. Post Debridement Measurements: 2cm length x 0.2cm width x 0.4cm depth; 0.126cm^3 volume. Character of Wound/Ulcer Post Debridement is improved. Severity of Tissue Post Debridement is: Fat layer exposed. 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 [name] skin graft procedure using [name] bioengineered skin substitute/cellular or tissue based product was performed by [name], DO with the following instrument(s): Forceps and Scissors. Grafix prime was applied and secured with Steri-Strips and adaptic. 1 sq cm of product was utilized and 0 sq cm was wasted. Post Application, SORBACT SWAB was applied. [name] Time Out was conducted at 11:43, prior to the start of the procedure. The procedure was tolerated well with [name] pain level of 0 throughout and [name] pain level of 0 following the procedure. Post procedure Diagnosis Wound #5: Same as Pre-Procedure . 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 There was [name] Excisional Skin/Subcutaneous Tissue Debridement with [name] total area of 2.5 sq cm performed by [name], DO. With the following instrument(s): Curette to remove Viable and Non-Viable tissue/material. Material removed includes Callus, Subcutaneous Tissue, Slough, Skin: Dermis, and Skin: Epidermis after achieving pain control using Lidocaine 5% topical ointment. [name] time out was conducted at 11:30, prior to the start of the procedure. [name] Minimum amount of bleeding was controlled with Pressure. The procedure was tolerated well with [name] pain level of 0 throughout and [name] pain level of 0 following the procedure. Post Debridement Measurements: 0.4cm length x 0.3cm width x 0.3cm depth; 0.028cm^3 volume. Character of Wound/Ulcer Post Debridement is improved. Severity of Tissue Post Debridement is: Fat layer exposed. Post procedure Diagnosis Wound #6: Same as Pre-Procedure Pre-procedure diagnosis of Wound #6 is [name] Diabetic Wound/Ulcer  of the Lower Extremity located on the Right Amputation Site - Transmetatarsal. [name] skin graft procedure using [name] bioengineered skin substitute/cellular or tissue based product was performed by [name], DO with the following instrument(s): Forceps and Scissors. Grafix prime was applied and secured with Steri-Strips and adaptic. 0 sq cm of product was utilized and 1 sq cm was wasted. Post Application, SORBACT SWAB was applied. [name] Time Out was conducted at 11:43, prior  to the start of the procedure. The procedure was tolerated well with [name] pain level of 0 throughout and [name] pain level of 0 following the procedure. Post procedure Diagnosis Wound #6: Same as Pre-Procedure . Plan Follow-up Appointments: Return Appointment in 1 week. - Dr. Celine [name] Covering (Dr. Heber [place] patient) Thursday 10/08/2022 1015 Return Appointment in 2 weeks. - Dr. Heber [place] Friday 10/16/2022 1115 after Hyperbarics. Other: - ****ONLY CHANGE OUTER DRESSINGS *** Anesthetic: (In clinic) Topical Lidocaine 5% applied to wound bed Cellular or Tissue Based Products: Cellular or Tissue Based Product Type: - Grafix # 3 applied 06/25/22 GRafix # 4 applied 07/02/22 Grafix # 5 applied 07/10/22 Grafix # 6 applied 07/17/22 Grafix # 7 applied 07/24/22---Also, go ahead and run IVR for more Grafix for left lateral foot Grafix #8 applied 07/31/2022 Grafix # 9 applied 08/07/22] left foot Grafix #10 applied 08/13/2022 left and right foot wounds today split the grafix in half. Graftx #11 applied to both wounds. Patient right foot approved for total of 12 between dates 08/18/2022-11/04/2022. Grafix #12 applied to both wounds. grafix #4 right foot split grafix and use to left foot #13. total can have 24. Grafix # 5 applied to right foot split grafix and use to left foot # 14. Grafix #6 right foot and Grafix #16 to left foot applied. Grafix #7 right foot and Grafix #17 left foot applied. Cellular or Tissue Based Product applied to wound bed, secured with  steri-strips, cover with Adaptic or Mepitel. (DO NOT REMOVE). Edema Control - Lymphedema / SCD / Other: Avoid standing for long periods of time. Off-Loading: Open toe surgical shoe to: - wear when standing or walking. Hyperbaric Oxygen Therapy: Evaluate for HBO Therapy - 07/24/22- Pt. to finish next 8 HBO treatments 07/24/22- DR. Righteous Claiborne wants pt. 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 Follow Hyperbaric Oxygen Glycemia Protocol Afrin (Oxymetazoline HCL) 0.05% nasal spray - 1 spray in both nostrils daily as needed prior to HBO treatment for difficulty clearing ears WOUND #5: - Foot Wound Laterality: Left, Lateral Cleanser: Wound Cleanser (Generic) 1 x Per Day/30 Days Discharge Instructions: Cleanse the wound with wound cleanser prior to applying [name] clean dressing using gauze sponges, not tissue or cotton balls. Peri-Wound Care: Skin Prep (Generic) 1 x Per Day/30 Days Discharge Instructions: Use skin prep as directed Prim Dressing: Cutimed Sorbact Swab 1 x Per Day/30 Days ary Discharge Instructions: Apply to wound bed behind grafix Prim Dressing: Grafix (Generic) 1 x Per Day/30 Days ary Discharge Instructions: applied by provider. Prim Dressing: adaptic and steri-strips 1 x Per Day/30 Days ary Discharge Instructions: to secure grafix to wound bed. leave in place. Secondary Dressing: ABD Pad, 8x10 (Generic) 1 x Per Day/30 Days Discharge Instructions: Apply over primary dressing as directed. Secured With: [employer], 4.5x3.1 (in/yd) (Generic) 1 x Per Day/30 Days Discharge Instructions: Secure with Kerlix as directed. Secured With: 23M  Medipore H Soft Cloth Surgical T ape, 4 x 10 (in/yd) (Generic) 1 x Per Day/30 Days Discharge Instructions: Secure with tape as directed. WOUND #6: - Amputation Site - Transmetatarsal Wound Laterality: Right Cleanser: Wound Cleanser (Generic) 1 x Per Day/30 Days Discharge Instructions: Cleanse the wound  with wound cleanser prior to applying [name] clean dressing using gauze sponges, not tissue or cotton balls. Peri-Wound Care: Skin Prep (Generic) 1 x Per Day/30 Days Discharge Instructions: Use skin prep as directed Prim Dressing: Grafix (Generic) 1 x Per Day/30 Days ary Discharge Instructions: applied by provider. Prim Dressing: adaptic and steri-strips 1 x Per Day/30 Days ary Discharge Instructions: to secure grafix to wound bed. leave in place. Prim Dressing: cutimed sorbact 1 x Per Day/30 Days ary Discharge Instructions: lightly pack into wound bed behind the grafix. Secondary Dressing: ABD Pad, 8x10 (Generic) 1 x Per Day/30 Days Discharge Instructions: Apply over primary dressing as directed. Secured With: [employer], 4.5x3.1 (in/yd) (Generic) 1 x Per Day/30 Days GREELEY, [name] ([id number]) [id number] Page 13 of 15 Discharge Instructions: Secure with Kerlix as directed. Secured With: 81M Medipore H Soft Cloth Surgical T ape, 4 x 10 (in/yd) (Generic) 1 x Per Day/30 Days Discharge Instructions: Secure with tape as directed. 1. In office sharp debridement 2. Grafix placed in standard fashion 3. Follow-up in 1 week 4. Continue HBO therapy Electronic Signature(s) Signed: 10/02/2022 12:14:37 PM By: [name] DO Entered By: [name] on 10/02/2022 12:09:14 -------------------------------------------------------------------------------- HxROS Details Patient Name: Date of Service: [name], [name]. 10/02/2022 10:15 [name] Medical Record Number: [id number] Patient Account Number: [id number] Date of Birth/Sex: Treating RN: [birth year] (67 y.o.  M) Primary Care Provider: [name] Other Clinician: Referring Provider: Treating Provider/Extender: [name] in Treatment: 24 Information Obtained From Patient Chart Eyes Medical History: Negative for: Cataracts; Glaucoma; Optic Neuritis Respiratory Medical History: Positive for: Asthma; Sleep Apnea Negative for: Aspiration; Chronic Obstructive Pulmonary Disease (COPD); Pneumothorax; Tuberculosis Cardiovascular Medical History: Positive for: Hypertension; Peripheral Venous Disease Negative for: Angina; Arrhythmia; Congestive Heart Failure; Coronary Artery Disease; Deep Vein Thrombosis; Hypotension; Myocardial Infarction; Peripheral Arterial Disease; Phlebitis; Vasculitis Endocrine Medical History: Positive for: Type II Diabetes Negative for: Type I Diabetes Time with diabetes: 20 years Treated with: Insulin, Oral agents Blood sugar tested every day: Yes Tested : 4x day Integumentary (Skin) Medical History: Negative for: History of Burn Musculoskeletal Medical History: Positive for: Osteomyelitis Negative for: Gout; Rheumatoid Arthritis; Osteoarthritis Neurologic Medical History: Positive for: Neuropathy Negative for: Dementia; Quadriplegia; Paraplegia; Seizure Disorder Psychiatric Medical History: MALEKAI, CARLE ([id number]) [phone].pdf Page 14 of 15 Past Medical History Notes: PTSD Immunizations Pneumococcal Vaccine: Received Pneumococcal Vaccination: No Implantable Devices None Hospitalization / Surgery History Type of Hospitalization/Surgery 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 Posey Pronto aortogram 12/08/2021 Dr. Virl Cagey [facility] Family and Social History Cancer: No; Diabetes: Yes - Mother,Siblings; Heart Disease: Yes - Siblings; Hereditary Spherocytosis: No; Hypertension: Yes - Siblings; Kidney Disease: No; Lung Disease: No; Seizures: No; Stroke: Yes - Mother; Thyroid Problems: No;  Tuberculosis: No; Former smoker; Marital Status - Single; Alcohol Use: Never; Drug Use: No History; Caffeine Use: Daily; Financial Concerns: No; Food, Clothing or Shelter Needs: No; Support System Lacking: No; Transportation Concerns: No Electronic Signature(s) Signed: 10/02/2022 12:14:37 PM By: [name] DO Entered By: [name] on 10/02/2022 12:06:56 -------------------------------------------------------------------------------- SuperBill Details Patient Name: Date of Service: [name], [name]. 10/02/2022 Medical Record Number: [id number] Patient Account Number: [id number] Date of Birth/Sex: Treating RN: [birth year] (67 y.o. [name] Primary Care Provider: [name] Other Clinician: Referring Provider: Treating Provider/Extender: [name] in Treatment: 24 Diagnosis Coding ICD-10 Codes Code Description [phone] Non-pressure chronic ulcer of other part of left foot with fat layer exposed M86.172 Other  acute osteomyelitis, left ankle and foot L97.528 Non-pressure chronic ulcer of other part of left foot with other specified severity E11.621 Type 2 diabetes mellitus with foot ulcer E11.42 Type 2 diabetes mellitus with diabetic polyneuropathy S91.301A Unspecified open wound, right foot, initial encounter 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 severity Facility Procedures : CPT4 Code: [id number] Description: Q4133- Grafix PL 16 mm disc (2 units) Modifier: Quantity: 2 : CPT4 Code: [id number] Description: [id number] - SKIN SUB GRAFT FACE/NK/HF/G ICD-10 Diagnosis Description 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 seve L97.522 Non-pressure chronic ulcer of other part of left foot with fat layer exposed  E11.621 Type 2 diabetes mellitus with foot ulcer Modifier: rity Quantity: 1 Physician Procedures : CPT4 Code Description Modifier [id number] - WC  PHYS SKIN SUB GRAFT FACE/NK/HF/G ICD-10 Diagnosis Description 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 severity L97.522 Non-pressure chronic ulcer of other  part of left foot with fat layer exposed E11.621 Type 2 diabetes mellitus with foot ulcer SLATON, COURTER [name] ([id number]) [id number] Pag Quantity: 1 e 15 of 15 Electronic Signature(s) Signed: 10/02/2022 12:14:37 PM By: [name] DO Entered By: [name] on 10/02/2022 12:09:27

## 2022-10-04 ENCOUNTER — Other Ambulatory Visit: Payer: Self-pay | Admitting: Adult Health

## 2022-10-04 DIAGNOSIS — J449 Chronic obstructive pulmonary disease, unspecified: Secondary | ICD-10-CM

## 2022-10-05 ENCOUNTER — Encounter (HOSPITAL_BASED_OUTPATIENT_CLINIC_OR_DEPARTMENT_OTHER): Payer: Medicare HMO | Attending: Internal Medicine | Admitting: General Surgery

## 2022-10-05 DIAGNOSIS — M86172 Other acute osteomyelitis, left ankle and foot: Secondary | ICD-10-CM | POA: Insufficient documentation

## 2022-10-05 DIAGNOSIS — L97522 Non-pressure chronic ulcer of other part of left foot with fat layer exposed: Secondary | ICD-10-CM | POA: Diagnosis present

## 2022-10-05 DIAGNOSIS — E1151 Type 2 diabetes mellitus with diabetic peripheral angiopathy without gangrene: Secondary | ICD-10-CM | POA: Insufficient documentation

## 2022-10-05 DIAGNOSIS — L84 Corns and callosities: Secondary | ICD-10-CM | POA: Insufficient documentation

## 2022-10-05 DIAGNOSIS — S91301A Unspecified open wound, right foot, initial encounter: Secondary | ICD-10-CM | POA: Diagnosis not present

## 2022-10-05 DIAGNOSIS — E1169 Type 2 diabetes mellitus with other specified complication: Secondary | ICD-10-CM | POA: Diagnosis not present

## 2022-10-05 DIAGNOSIS — L97528 Non-pressure chronic ulcer of other part of left foot with other specified severity: Secondary | ICD-10-CM | POA: Insufficient documentation

## 2022-10-05 DIAGNOSIS — E1142 Type 2 diabetes mellitus with diabetic polyneuropathy: Secondary | ICD-10-CM | POA: Insufficient documentation

## 2022-10-05 DIAGNOSIS — E11621 Type 2 diabetes mellitus with foot ulcer: Secondary | ICD-10-CM | POA: Diagnosis not present

## 2022-10-05 DIAGNOSIS — L97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 severity: Secondary | ICD-10-CM | POA: Diagnosis not present

## 2022-10-05 DIAGNOSIS — Z89431 Acquired absence of right foot: Secondary | ICD-10-CM | POA: Diagnosis not present

## 2022-10-05 DIAGNOSIS — X58XXXA Exposure to other specified factors, initial encounter: Secondary | ICD-10-CM | POA: Diagnosis not present

## 2022-10-05 LAB — GLUCOSE, CAPILLARY
Glucose-Capillary: 181 mg/dL — ABNORMAL HIGH (ref 70–99)
Glucose-Capillary: 127 mg/dL — ABNORMAL HIGH (ref 70–99)

## 2022-10-06 ENCOUNTER — Encounter (HOSPITAL_BASED_OUTPATIENT_CLINIC_OR_DEPARTMENT_OTHER): Payer: Medicare HMO | Admitting: General Surgery

## 2022-10-06 DIAGNOSIS — E11621 Type 2 diabetes mellitus with foot ulcer: Secondary | ICD-10-CM | POA: Diagnosis not present

## 2022-10-06 LAB — GLUCOSE, CAPILLARY
Glucose-Capillary: 188 mg/dL — ABNORMAL HIGH (ref 70–99)
Glucose-Capillary: 117 mg/dL — ABNORMAL HIGH (ref 70–99)

## 2022-10-06 NOTE — Progress Notes (Signed)
GIN, RUBLEY [name] [name] ([id number]) [id number] Page 1 of 2 Visit Report for 09/29/2022 Arrival Information Details Patient Name: Date of Service: [name] [name], [name] [name]. 09/29/2022 8:00 [name] [name] Medical Record Number: [id number] Patient Account Number: [id number] Date of Birth/Sex: Treating RN: 02-02-1956 (66 y.o. [name] [name] Primary Care Avary Eichenberger: [name] Other Clinician: [name] Referring Amoni Morales: Treating Sharrod Achille/Extender: [name] in Treatment: 24 Visit Information History Since Last Visit All ordered tests and consults were completed: Yes Patient Arrived: Wheel Chair Added or deleted any medications: No Arrival Time: 07:30 Any new allergies or adverse reactions: No Accompanied By: self Had [name] [name] fall or experienced change in No Transfer Assistance: None activities of daily living that may affect Patient Identification Verified: Yes risk of falls: Secondary Verification Process Completed: Yes Signs or symptoms of abuse/neglect since last visito No Patient Requires Transmission-Based Precautions: No Hospitalized since last visit: No Patient Has Alerts: Yes Implantable device outside of the clinic excluding No cellular tissue based products placed in the center since last visit: Pain Present Now: No Electronic Signature(s) Signed: 10/05/2022 8:57:55 PM By: [name] CHT EMT BS , , Entered By: [name] on 09/29/2022 12:58:25 -------------------------------------------------------------------------------- Encounter Discharge Information Details Patient Name: Date of Service: [name] [name], [name] [name]. 09/29/2022 8:00 [name] [name] Medical Record Number: [id number] Patient Account Number: [id number] Date of Birth/Sex: Treating RN: 03/28/1956 (67 y.o. [name] [name] Primary Care Dhani Imel: [name] Other Clinician: [name] Referring Kashawna Manzer: Treating Winifred Bodiford/Extender: [name] in Treatment: 24 Encounter Discharge Information Items Discharge Condition: Stable Ambulatory Status: Wheelchair Discharge Destination: Home Transportation: Private Auto Accompanied By: self Schedule Follow-up Appointment: No Clinical Summary of Care: Electronic Signature(s) Signed: 10/05/2022 8:57:55 PM By: [name] CHT EMT BS , , Entered By: [name] on 09/29/2022 13:10:38 -------------------------------------------------------------------------------- Vitals Details Patient Name: Date of Service: [name] [name], [name] [name]. 09/29/2022 8:00 [name] [name] Medical Record Number: [id number] Patient Account Number: [id number] Date of Birth/Sex: Treating RN: 1955-12-19 (67 y.o. [name] [name] Primary Care Ellianne Gowen: [name] Other Clinician: [name] Referring Alanzo Lamb: Treating Xiana Carns/Extender: [name], [name] in Treatment: 24 Vital Signs Time Taken: 07:43 Temperature (F): 98.1 [name] [name], [name] [name] ([id number]) [id number] Page 2 of 2 Height (in): 72 Pulse (bpm): 89 Weight (lbs): 220 Respiratory Rate (breaths/min): 18 Body Mass Index (BMI): 29.8 Blood Pressure (mmHg): 150/85 Capillary Blood Glucose (mg/dl): 348 Reference Range: 80 - 120 mg / dl Electronic Signature(s) Signed: 10/05/2022 8:57:55 PM By: [name] CHT EMT BS , , Entered By: [name] on 09/29/2022 12:58:57

## 2022-10-06 NOTE — Progress Notes (Signed)
KAIDENN, KROLL A ([id number]) [id number] Page 1 of 2 Visit Report for 09/29/2022 HBO Details Patient Name: Date of Service: [name], [name] 09/29/2022 8:00 A M Medical Record Number: [id number] Patient Account Number: [id number] Date of Birth/Sex: Treating RN: 1956-06-14 (67 y.o. [name], [name] Primary Care Jaliya Siegmann: [name] Other Clinician: [name] Referring Arwa Yero: Treating Ozetta Flatley/Extender: [name] in Treatment: 24 HBO Treatment Course Details Treatment Course Number: 1 Ordering [place] Man[place]: [name] T Treatments Ordered: otal 80 HBO Treatment Start Date: 06/01/2022 HBO Indication: Diabetic Ulcer(s) of the Lower Extremity Standard/Conservative Wound Care tried and failed greater than or equal to 30 days HBO Treatment Details Treatment Number: 72 Patient Type: Outpatient Chamber Type: Monoplace Chamber Serial #: [id number] Treatment Protocol: 2.5 ATA with 90 minutes oxygen, with two 5 minute air breaks Treatment Details Compression Rate Down: 1.5 psi / minute De-Compression Rate Up: 2.0 psi / minute A breaks and breathing ir Compress Tx Pressure periods Decompress Decompress Begins Reached (leave unused spaces Begins Ends blank) Chamber Pressure (ATA 1 2.5 2.5 2.5 2.5 2.5 - - 2.5 1 ) Clock Time (24 hr) 08:15 08:32 09:02 09:07 09:37 09:42 - - 10:12 10:23 Treatment Length: 128 (minutes) Treatment Segments: 4 Vital Signs Capillary Blood Glucose Reference Range: 80 - 120 mg / dl HBO Diabetic Blood Glucose Intervention Range: <131 mg/dl or >249 mg/dl Type: Time Vitals Blood Respiratory Capillary Blood Glucose Pulse Action Pulse: Temperature: Taken: Pressure: Rate: Glucose (mg/dl): Meter #: Oximetry (%) Taken: Pre 07:43 150/85 89 18 98.1 348 1 none per protocol Post 10:29 156/92 81 18 97.5 205 1 none per protocol Treatment Response Treatment Toleration: Well Treatment Completion Status:  Treatment Completed without Adverse Event Treatment Notes Upon arrival, patient's blood glucose level was 348 mg/dL. Patient only symptoms was thirst. Physician notified and patient cleared as he felt well otherwise. Other vital signs were within normal range. After performing a safety check, patient was placed in the chamber which was compressed with 100% oxygen at 2 psi/min after confirming normal ear equalization. Mr. Urgiles tolerated the treatment and subsequent decompression of the chamber at a rate of 2 psi/min. He denied any issues with ear equalization and/or pain and was stable upon discharge with a blood glucose level of 205 mg/dL. Additional Procedure Documentation Tissue Sevierity: Necrosis of bone Physician HBO Attestation: I certify that I supervised this HBO treatment in accordance with Medicare guidelines. A trained emergency response team is readily available per Yes hospital policies and procedures. Continue HBOT as ordered. Yes Electronic Signature(s) Signed: 10/01/2022 2:37:26 PM By: [name] CHT EMT BS , , Signed: 10/01/2022 3:02:48 PM By: [name] DO Previous Signature: 09/30/2022 2:29:03 PM Version By: [name] DO Previous Signature: 09/29/2022 3:28:36 PM Version By: [name] DO Entered By: [name] on 10/01/2022 14:37:25 [name] ([id number].pdf Page 2 of 2 -------------------------------------------------------------------------------- HBO Safety Checklist Details Patient Name: Date of Service: [name], [name] 09/29/2022 8:00 A M Medical Record Number: [id number] Patient Account Number: [id number] Date of Birth/Sex: Treating RN: April 09, 1956 (67 y.o. [name] Primary Care Rohan Juenger: [name] Other Clinician: [name] Referring Odetta Forness: Treating Kimimila Tauzin/Extender: [name] in Treatment: 24 HBO Safety Checklist Items Safety  Checklist Consent Form Signed Patient voided / foley secured and emptied When did you last eato 0615 Last dose of injectable or oral agent 0645 Ostomy pouch emptied and vented if applicable NA All implantable devices assessed, documented and approved NA  Intravenous access site secured and place NA Valuables secured Linens and cotton and cotton/polyester blend (less than 51% polyester) Personal oil-based products / skin lotions / body lotions removed Wigs or hairpieces removed NA Smoking or tobacco materials removed NA Books / newspapers / magazines / loose paper removed Cologne, aftershave, perfume and deodorant removed Jewelry removed (may wrap wedding band) Make-up removed NA Hair care products removed Battery operated devices (external) removed Heating patches and chemical warmers removed Titanium eyewear removed Nail polish cured greater than 10 hours NA Casting material cured greater than 10 hours NA Hearing aids removed NA Loose dentures or partials removed dentures removed Prosthetics have been removed NA Patient demonstrates correct use of air break device (if applicable) Patient concerns have been addressed Patient grounding bracelet on and cord attached to chamber Specifics for Inpatients (complete in addition to above) Medication sheet sent with patient NA Intravenous medications needed or due during therapy sent with patient NA Drainage tubes (e.g. nasogastric tube or chest tube secured and vented) NA Endotracheal or Tracheotomy tube secured NA Cuff deflated of air and inflated with saline NA Airway suctioned NA Notes Paper version used prior to treatment start. Electronic Signature(s) Signed: 10/05/2022 8:57:55 PM By: [name] CHT EMT BS , , Entered By: [name] on 09/29/2022 13:00:10

## 2022-10-06 NOTE — Progress Notes (Signed)
TOLUWANIMI, MCCALEB A ([id number]) [id number] Page 1 of 1 Visit Report for 09/29/2022 SuperBill Details Patient Name: Date of Service: [name] 09/29/2022 Medical Record Number: [id number] Patient Account Number: [id number] Date of Birth/Sex: Treating RN: [birth year] (67 y.o. [name], [name] Primary Care Provider: [name] Other Clinician: [name] Referring Provider: Treating Provider/Extender: [name] in Treatment: 24 Diagnosis Coding ICD-10 Codes Code Description [phone] Non-pressure chronic ulcer of other part of left foot with fat layer exposed M86.172 Other acute osteomyelitis, left ankle and foot L97.528 Non-pressure chronic ulcer of other part of left foot with other specified severity E11.621 Type 2 diabetes mellitus with foot ulcer E11.42 Type 2 diabetes mellitus with diabetic polyneuropathy S91.301A Unspecified open wound, right foot, initial encounter 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 severity Facility Procedures CPT4 Code Description Modifier Quantity [id number]-(Facility Use Only) HBOT full body chamber, 11min , 4 ICD-10 Diagnosis Description E11.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 M86.172 Other acute osteomyelitis, left ankle and foot Physician Procedures Quantity CPT4 Code Description Modifier [id number] - WC PHYS HYPERBARIC OXYGEN THERAPY 1 ICD-10 Diagnosis Description E11.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 M86.172 Other acute osteomyelitis, left ankle and  foot Electronic Signature(s) Signed: 09/29/2022 3:28:36 PM By: [name] DO Signed: 10/05/2022 8:57:55 PM By: [name] CHT EMT BS , , Entered By: [name] on 09/29/2022 13:09:47

## 2022-10-07 ENCOUNTER — Encounter (HOSPITAL_BASED_OUTPATIENT_CLINIC_OR_DEPARTMENT_OTHER): Payer: Medicare HMO | Admitting: Physician Assistant

## 2022-10-07 DIAGNOSIS — E11621 Type 2 diabetes mellitus with foot ulcer: Secondary | ICD-10-CM | POA: Diagnosis not present

## 2022-10-07 LAB — GLUCOSE, CAPILLARY
Glucose-Capillary: 247 mg/dL — ABNORMAL HIGH (ref 70–99)
Glucose-Capillary: 135 mg/dL — ABNORMAL HIGH (ref 70–99)

## 2022-10-07 NOTE — Progress Notes (Signed)
ELESTER, DUNMAN A ([id number]) [id number] Page 1 of 2 Visit Report for 10/06/2022 Arrival Information Details Patient Name: Date of Service: [name], [name] 10/06/2022 8:00 A M Medical Record Number: [id number] Patient Account Number: [id number] Date of Birth/Sex: Treating RN: 1956-06-28 (67 y.o. [name], [name] Primary Care Zadin Lange: [name] Other Clinician: Referring Nalin Mazzocco: Treating Fay Swider/Extender: Romie Jumper, [name] in Treatment: 25 Visit Information History Since Last Visit Added or deleted any medications: No Patient Arrived: Wheel Chair Any new allergies or adverse reactions: No Arrival Time: 07:40 Had a fall or experienced change in No Accompanied By: self activities of daily living that may affect Transfer Assistance: Manual risk of falls: Patient Identification Verified: Yes Signs or symptoms of abuse/neglect since last visito No Secondary Verification Process Completed: Yes Hospitalized since last visit: No Patient Requires Transmission-Based Precautions: No Implantable device outside of the clinic excluding No Patient Has Alerts: Yes cellular tissue based products placed in the center since last visit: Has Dressing in Place as Prescribed: Yes Pain Present Now: No Electronic Signature(s) Signed: 10/07/2022 4:24:30 PM By: Rhae Hammock RN Entered By: Rhae Hammock on 10/06/2022 10:57:28 -------------------------------------------------------------------------------- Encounter Discharge Information Details Patient Name: Date of Service: [name], [name] 10/06/2022 8:00 A M Medical Record Number: [id number] Patient Account Number: [id number] Date of Birth/Sex: Treating RN: 05-05-1956 (67 y.o. [name], [name] Primary Care Taylinn Brabant: [name] Other Clinician: Referring Emmalou Hunger: Treating Kaitland Lewellyn/Extender: Delton See in Treatment: 25 Encounter Discharge  Information Items Discharge Condition: Stable Ambulatory Status: Wheelchair Discharge Destination: Home Transportation: Private Auto Accompanied By: self Schedule Follow-up Appointment: Yes Clinical Summary of Care: Patient Declined Electronic Signature(s) Signed: 10/07/2022 4:24:30 PM By: Rhae Hammock RN Entered By: Rhae Hammock on 10/06/2022 11:02:11 -------------------------------------------------------------------------------- Patient/Caregiver Education Details Patient Name: Date of Service: [name] 4/2/2024andnbsp8:00 A M Medical Record Number: [id number] Patient Account Number: [id number] Date of Birth/Gender: Treating RN: 1956/01/20 (67 y.o. [name], [name] Primary Care Physician: [name] Other Clinician: Referring Physician: Treating Physician/Extender: Delton See in Treatment: [address] SHINICHI, DEGUIA A ([id number]) [id number] Page 2 of 2 Education Provided To: Patient Education Topics Provided Wound/Skin Impairment: Methods: Explain/Verbal Responses: Reinforcements needed, State content correctly Electronic Signature(s) Signed: 10/07/2022 4:24:30 PM By: Rhae Hammock RN Entered By: Rhae Hammock on 10/06/2022 11:01:49 -------------------------------------------------------------------------------- Vitals Details Patient Name: Date of Service: [name], [name] 10/06/2022 8:00 A M Medical Record Number: [id number] Patient Account Number: [id number] Date of Birth/Sex: Treating RN: 11-Aug-1955 (67 y.o. [name], [name] Primary Care Bernetta Sutley: [name] Other Clinician: Referring Auron Tadros: Treating Ilisha Blust/Extender: Romie Jumper, [name] in Treatment: 25 Vital Signs Time Taken: 07:40 Temperature (F): 97.5 Height (in): 72 Pulse (bpm): 77 Weight (lbs): 220 Respiratory Rate (breaths/min): 17 Body Mass Index (BMI): 29.8 Blood  Pressure (mmHg): 133/71 Capillary Blood Glucose (mg/dl): 188 Reference Range: 80 - 120 mg / dl Electronic Signature(s) Signed: 10/07/2022 4:24:30 PM By: Rhae Hammock RN Entered By: Rhae Hammock on 10/06/2022 10:58:13

## 2022-10-07 NOTE — Progress Notes (Signed)
MARQUIST, DUMAINE A ([id number]) [id number] Page 1 of 2 Visit Report for 10/06/2022 HBO Details Patient Name: Date of Service: [name], [name] 10/06/2022 8:00 A M Medical Record Number: [id number] Patient Account Number: [id number] Date of Birth/Sex: Treating RN: 1956-01-01 (67 y.o. [name], [place] Primary Care Myleigh Amara: [name] Other Clinician: Referring Navdeep Fessenden: Treating Amado Andal/Extender: Romie Jumper, [name] in Treatment: 25 HBO Treatment Course Details Treatment Course Number: 1 Ordering Joclyn Alsobrook: [name] T Treatments Ordered: otal 80 HBO Treatment Start Date: 06/01/2022 HBO Indication: Diabetic Ulcer(s) of the Lower Extremity Standard/Conservative Wound Care tried and failed greater than or equal to 30 days HBO Treatment Details Treatment Number: 77 Patient Type: Outpatient Chamber Type: Monoplace Chamber Serial #: [id number] Treatment Protocol: 2.5 ATA with 90 minutes oxygen, with two 5 minute air breaks Treatment Details Compression Rate Down: 2.0 psi / minute De-Compression Rate Up: 2.0 psi / minute A breaks and breathing ir Compress Tx Pressure periods Decompress Decompress Begins Reached (leave unused spaces Begins Ends blank) Chamber Pressure (ATA 1 2.5 2.5 2.5 2.5 2.5 - - 2.5 1 ) Clock Time (24 hr) 08:19 08:37 09:01 09:06 09:36 09:41 - - 10:11 10:21 Treatment Length: 122 (minutes) Treatment Segments: 4 Vital Signs Capillary Blood Glucose Reference Range: 80 - 120 mg / dl HBO Diabetic Blood Glucose Intervention Range: <131 mg/dl or >249 mg/dl Time Vitals Blood Respiratory Capillary Blood Glucose Pulse Action Type: Pulse: Temperature: Taken: Pressure: Rate: Glucose (mg/dl): Meter #: Oximetry (%) Taken: Pre 07:40 133/71 77 17 97.5 188 Post 10:22 157/64 74 17 96.6 117 Treatment Response Treatment Toleration: Well Treatment Completion Status: Treatment Completed without Adverse Event Physician HBO  Attestation: I certify that I supervised this HBO treatment in accordance with Medicare guidelines. A trained emergency response team is readily available per Yes hospital policies and procedures. Continue HBOT as ordered. Yes Electronic Signature(s) Signed: 10/06/2022 12:35:21 PM By: Fredirick Maudlin MD FACS Entered By: Fredirick Maudlin on 10/06/2022 12:35:20 -------------------------------------------------------------------------------- HBO Safety Checklist Details Patient Name: Date of Service: [name], [name] 10/06/2022 8:00 A M Medical Record Number: [id number] Patient Account Number: [id number] Date of Birth/Sex: Treating RN: [birth year] (67 y.o. [name], [name] Primary Care Akeiba Axelson: [name] Other Clinician: Referring Glory Graefe: Treating Justinn Welter/Extender: Romie Jumper, [name] in Treatment: [employer]offe[employer] Items GIORDAN, GILBERTSON A ([id number]) [id number] Page 2 of 2 Safety Checklist Consent Form Signed Patient voided / foley secured and emptied When did you last eato 0700 Last dose of injectable or oral agent 0625 Ostomy pouch emptied and vented if applicable NA All implantable devices assessed, documented and approved NA Intravenous access site secured and place NA Valuables secured Linens and cotton and cotton/polyester blend (less than 51% polyester) Personal oil-based products / skin lotions / body lotions removed NA Wigs or hairpieces removed NA Smoking or tobacco materials removed NA Books / newspapers / magazines / loose paper removed NA Cologne, aftershave, perfume and deodorant removed NA Jewelry removed (may wrap wedding band) NA Make-up removed NA Hair care products removed NA Battery operated devices (external) removed NA Heating patches and chemical warmers removed NA Titanium eyewear removed NA Nail polish cured greater than 10 hours NA Casting material cured greater than 10  hours NA Hearing aids removed NA Loose dentures or partials removed NA Prosthetics have been removed NA Patient demonstrates correct use of air break device (if applicable) Patient concerns have been addressed Patient grounding bracelet on and cord attached to chamber Specifics for Inpatients (  complete in addition to above) Medication sheet sent with patient Intravenous medications needed or due during therapy sent with patient Drainage tubes (e.g. nasogastric tube or chest tube secured and vented) Endotracheal or Tracheotomy tube secured Cuff deflated of air and inflated with saline Airway suctioned Electronic Signature(s) Signed: 10/07/2022 4:24:30 PM By: Rhae Hammock RN Entered By: Rhae Hammock on 10/06/2022 10:59:13

## 2022-10-07 NOTE — Progress Notes (Signed)
MORTIMER, PARMA A ([id number]) [id number] Page 1 of 1 Visit Report for 10/06/2022 SuperBill Details Patient Name: Date of Service: [name], [name] 10/06/2022 Medical Record Number: [id number] Patient Account Number: [id number] Date of Birth/Sex: Treating RN: 1956/05/24 (67 y.o. [name], [name] Primary Care Provider: [name] Other Clinician: Referring Provider: Treating Provider/Extender: Romie Jumper, [name] in Treatment: 25 Diagnosis Coding ICD-10 Codes Code Description [phone] Non-pressure chronic ulcer of other part of left foot with fat layer exposed M86.172 Other acute osteomyelitis, left ankle and foot L97.528 Non-pressure chronic ulcer of other part of left foot with other specified severity E11.621 Type 2 diabetes mellitus with foot ulcer E11.42 Type 2 diabetes mellitus with diabetic polyneuropathy S91.301A Unspecified open wound, right foot, initial encounter 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 severity Facility Procedures CPT4 Code Description Modifier Quantity [id number] G0277-(Facility Use Only) HBOT full body chamber, 6min , 4 Physician Procedures Quantity CPT4 Code Description Modifier [id number] - WC PHYS HYPERBARIC OXYGEN THERAPY 1 ICD-10 Diagnosis Description M86.172 Other acute osteomyelitis, left ankle and foot Electronic Signature(s) Signed: 10/06/2022 12:34:56 PM By: Fredirick Maudlin MD FACS Signed: 10/07/2022 4:24:30 PM By: Rhae Hammock RN Entered By: Rhae Hammock on 10/06/2022 11:01:07

## 2022-10-07 NOTE — Progress Notes (Signed)
RYEL, FIORAVANTI A ([id number]) [id number] Page 1 of 2 Visit Report for 10/07/2022 HBO Details Patient Name: Date of Service: [name], [name] 10/07/2022 8:00 A M Medical Record Number: [id number] Patient Account Number: [id number] Date of Birth/Sex: Treating RN: 08/23/1955 (67 y.o. [name], [place] Primary Care Ezriel Boffa: [name] Other Clinician: Referring Nani Ingram: Treating Rockie Vawter/Extender: [name], [name] in Treatment: 25 HBO Treatment Course Details Treatment Course Number: 1 Ordering Paiden Caraveo: [name] T Treatments Ordered: otal 80 HBO Treatment Start Date: 06/01/2022 HBO Indication: Diabetic Ulcer(s) of the Lower Extremity Standard/Conservative Wound Care tried and failed greater than or equal to 30 days HBO Treatment Details Treatment Number: 78 Patient Type: Outpatient Chamber Type: Monoplace Chamber Serial #: [id number] Treatment Protocol: 2.5 ATA with 90 minutes oxygen, with two 5 minute air breaks Treatment Details Compression Rate Down: 2.0 psi / minute De-Compression Rate Up: 2.0 psi / minute A breaks and breathing ir Compress Tx Pressure periods Decompress Decompress Begins Reached (leave unused spaces Begins Ends blank) Chamber Pressure (ATA 1 2.5 2.5 2.5 2.5 2.5 - - 2.5 1 ) Clock Time (24 hr) 08:02 08:17 08:47 08:52 09:22 09:27 - - 09:57 10:09 Treatment Length: 127 (minutes) Treatment Segments: 4 Vital Signs Capillary Blood Glucose Reference Range: 80 - 120 mg / dl HBO Diabetic Blood Glucose Intervention Range: <131 mg/dl or >249 mg/dl Time Vitals Blood Respiratory Capillary Blood Glucose Pulse Action Type: Pulse: Temperature: Taken: Pressure: Rate: Glucose (mg/dl): Meter #: Oximetry (%) Taken: Pre 07:38 150/88 90 18 97.9 247 Post 10:10 166/96 70 17 97.2 135 Treatment Response Treatment Toleration: Well Treatment Completion Status: Treatment Completed without Adverse Event Electronic  Signature(s) Signed: 10/07/2022 4:14:50 PM By: Worthy Keeler PA-C Signed: 10/07/2022 4:24:30 PM By: Rhae Hammock RN Entered By: Rhae Hammock on 10/07/2022 10:36:21 -------------------------------------------------------------------------------- HBO Safety Checklist Details Patient Name: Date of Service: [name], [name] 10/07/2022 8:00 A M Medical Record Number: [id number] Patient Account Number: [id number] Date of Birth/Sex: Treating RN: [birth year] (67 y.o. [name], [name] Primary Care Junie Engram: [name] Other Clinician: Referring Remus Hagedorn: Treating Asani Deniston/Extender: [name], [name] in Treatment: 25 HBO Safety Checklist Items Safety Checklist Consent Form Signed Patient voided / foley secured and emptied When did you last eato 0700 JONADAB, RECKARD A ([id number]) [phone].pdf Page 2 of 2 Last dose of injectable or oral agent 0700 Ostomy pouch emptied and vented if applicable NA All implantable devices assessed, documented and approved NA Intravenous access site secured and place NA Valuables secured Linens and cotton and cotton/polyester blend (less than 51% polyester) Personal oil-based products / skin lotions / body lotions removed NA Wigs or hairpieces removed NA Smoking or tobacco materials removed NA Books / newspapers / magazines / loose paper removed NA Cologne, aftershave, perfume and deodorant removed NA Jewelry removed (may wrap wedding band) NA Make-up removed NA Hair care products removed NA Battery operated devices (external) removed NA Heating patches and chemical warmers removed NA Titanium eyewear removed NA Nail polish cured greater than 10 hours NA Casting material cured greater than 10 hours NA Hearing aids removed NA Loose dentures or partials removed NA Prosthetics have been removed NA Patient demonstrates correct use of air break device (if applicable) Patient concerns  have been addressed Patient grounding bracelet on and cord attached to chamber Specifics for Inpatients (complete in addition to above) Medication sheet sent with patient Intravenous medications needed or due during therapy sent with patient Drainage tubes (e.g. nasogastric tube  or chest tube secured and vented) Endotracheal or Tracheotomy tube secured Cuff deflated of air and inflated with saline Airway suctioned Electronic Signature(s) Signed: 10/07/2022 4:24:30 PM By: Rhae Hammock RN Entered By: Rhae Hammock on 10/07/2022 10:34:00

## 2022-10-07 NOTE — Progress Notes (Signed)
DERRIEN, GADZINSKI A ([id number]) [id number] Page 1 of 1 Visit Report for 10/07/2022 SuperBill Details Patient Name: Date of Service: [name], [name] 10/07/2022 Medical Record Number: [id number] Patient Account Number: [id number] Date of Birth/Sex: Treating RN: Aug 01, 1955 (67 y.o. [name], [name] Primary Care Provider: [name] Other Clinician: Referring Provider: Treating Provider/Extender: [name], [name] in Treatment: 25 Diagnosis Coding ICD-10 Codes Code Description [phone] Non-pressure chronic ulcer of other part of left foot with fat layer exposed M86.172 Other acute osteomyelitis, left ankle and foot L97.528 Non-pressure chronic ulcer of other part of left foot with other specified severity E11.621 Type 2 diabetes mellitus with foot ulcer E11.42 Type 2 diabetes mellitus with diabetic polyneuropathy S91.301A Unspecified open wound, right foot, initial encounter 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 severity Facility Procedures CPT4 Code Description Modifier Quantity [id number] G0277-(Facility Use Only) HBOT full body chamber, 28min , 4 Physician Procedures Quantity CPT4 Code Description Modifier [id number] - WC PHYS HYPERBARIC OXYGEN THERAPY 1 ICD-10 Diagnosis Description M86.172 Other acute osteomyelitis, left ankle and foot Electronic Signature(s) Signed: 10/07/2022 4:14:50 PM By: Worthy Keeler PA-C Signed: 10/07/2022 4:24:30 PM By: Rhae Hammock RN Entered By: Rhae Hammock on 10/07/2022 10:36:36

## 2022-10-07 NOTE — Progress Notes (Signed)
SHERLOCK, CALLERY [name] ([id number]) [id number] Page 1 of 2 Visit Report for 10/07/2022 Arrival Information Details Patient Name: Date of Service: [name], [name]. 10/07/2022 8:00 [name] Medical Record Number: [id number] Patient Account Number: [id number] Date of Birth/Sex: Treating RN: [birth year] (67 y.o. [name]: [name]: [name] [name] in Treatment: 25 Visit Information History Since Last Visit Added or deleted any medications: No Patient Arrived: Wheel Chair Any new allergies or adverse reactions: No Arrival Time: 07:30 Had [name] fall or experienced change in No Accompanied By: self activities of daily living that may affect Transfer Assistance: Manual risk of [name]: Patient Identification Verified: Yes Signs or symptoms of abuse/neglect since last visito No Secondary Verification Process Completed: Yes Hospitalized since last visit: No Patient Requires Transmission-Based Precautions: No Implantable device outside of the clinic excluding No Patient Has Alerts: Yes cellular tissue based products placed in the center since last visit: Has Dressing in Place as Prescribed: Yes Pain Present Now: No Electronic Signature(s) Signed: 10/07/2022 4:24:30 PM By: [name] Hammock RN Entered By: [name] on 10/07/2022 10:30:12 -------------------------------------------------------------------------------- Encounter Discharge Information Details Patient Name: Date of Service: [name], [name]. 10/07/2022 8:00 [name] Medical Record Number: [id number] Patient Account Number: [id number] Date of Birth/Sex: Treating RN: 07/29/1955 (67 y.o. [name] [name] Care [name]: [name] [name] Clinician: Referring Azam [name]: [name] in Treatment: 25 Encounter Discharge  Information Items Discharge Condition: Stable Ambulatory Status: Wheelchair Discharge Destination: Home Transportation: Private Auto Accompanied By: self Schedule Follow-up Appointment: Yes Clinical Summary of Care: Patient Declined Electronic Signature(s) Signed: 10/07/2022 4:24:30 PM By: [name] Hammock RN Entered By: [name] on 10/07/2022 10:37:18 -------------------------------------------------------------------------------- Patient/Caregiver Education Details Patient Name: Date of Service: [name] 4/3/2024andnbsp8:00 [name] Medical Record Number: [id number] Patient Account Number: [id number] Date of Birth/Gender: Treating RN: 02-22-1956 (67 y.o. [name] [name] Care Physician: [name] [name] Clinician: Referring Physician: Treating Physician/Extender: [name] Moan, [name] in Treatment: [address] [name], [name] ([id number]) [id number] Page 2 of 2 Education Provided To: Patient Education Topics Provided Wound/Skin Impairment: Methods: Explain/Verbal Responses: Reinforcements needed, State content correctly Electronic Signature(s) Signed: 10/07/2022 4:24:30 PM By: [name] Hammock RN Entered By: [name] on 10/07/2022 10:36:58 -------------------------------------------------------------------------------- Vitals Details Patient Name: Date of Service: [name], [name]. 10/07/2022 8:00 [name] Medical Record Number: [id number] Patient Account Number: [id number] Date of Birth/Sex: Treating RN: 04-Feb-1956 (67 y.o. [name] [name] Care [name]: [name] [name] Clinician: Referring [name]: [name] [name] in Treatment: 25 Vital Signs Time Taken: 07:38 Temperature (F): 97.9 Height (in): 72 Pulse (bpm): 90 Weight (lbs): 220 Respiratory Rate (breaths/min): 18 Body Mass Index (BMI): 29.8 Blood Pressure  (mmHg): 150/88 Capillary Blood Glucose (mg/dl): 247 Reference Range: 80 - 120 mg / dl Electronic Signature(s) Signed: 10/07/2022 4:24:30 PM By: [name] Hammock RN Entered By: [name] on 10/07/2022 10:30:53

## 2022-10-07 NOTE — Progress Notes (Signed)
BARIS, PAJAK A ([id number]) [id number] Page 1 of 2 Visit Report for 10/05/2022 Arrival Information Details Patient Name: Date of Service: [name], [name] 10/05/2022 8:00 A M Medical Record Number: [id number] Patient Account Number: [id number] Date of Birth/Sex: Treating RN: 1956/01/24 (67 y.o. [name], [name] Primary Care Sade Mehlhoff: [name] Other Clinician: [name] Referring Rj Pedrosa: Treating Justen Fonda/Extender: [name] in Treatment: 24 Visit Information History Since Last Visit Added or deleted any medications: No Patient Arrived: Wheel Chair Any new allergies or adverse reactions: No Arrival Time: 09:25 Had a fall or experienced change in No Accompanied By: self activities of daily living that may affect Transfer Assistance: Manual risk of falls: Patient Identification Verified: Yes Signs or symptoms of abuse/neglect since last visito No Secondary Verification Process Completed: Yes Hospitalized since last visit: No Patient Requires Transmission-Based Precautions: No Implantable device outside of the clinic excluding No Patient Has Alerts: Yes cellular tissue based products placed in the center since last visit: Has Dressing in Place as Prescribed: Yes Pain Present Now: No Electronic Signature(s) Signed: 10/07/2022 4:24:30 PM By: Rhae Hammock RN Entered By: Rhae Hammock on 10/05/2022 09:26:28 -------------------------------------------------------------------------------- Encounter Discharge Information Details Patient Name: Date of Service: [name], [name] 10/05/2022 8:00 A M Medical Record Number: [id number] Patient Account Number: [id number] Date of Birth/Sex: Treating RN: 09-03-1955 (67 y.o. [name], [name] Primary Care Daven Montz: [name] Other Clinician: [name] Referring Calvert Charland: Treating Jeany Seville/Extender: [name] in  Treatment: 24 Encounter Discharge Information Items Discharge Condition: Stable Ambulatory Status: Wheelchair Discharge Destination: Home Transportation: Private Auto Accompanied By: self Schedule Follow-up Appointment: Yes Clinical Summary of Care: Patient Declined Electronic Signature(s) Signed: 10/07/2022 4:24:30 PM By: Rhae Hammock RN Entered By: Rhae Hammock on 10/05/2022 10:35:45 -------------------------------------------------------------------------------- Patient/Caregiver Education Details Patient Name: Date of Service: [name] 4/1/2024andnbsp8:00 A M Medical Record Number: [id number] Patient Account Number: [id number] Date of Birth/Gender: Treating RN: 1956/01/16 (67 y.o. [name], [name] Primary Care Physician: [name] Other Clinician: [name] Referring Physician: Treating Physician/Extender: [name] in Treatment: [address] KASHIF, POLAK A ([id number]) [id number] Page 2 of 2 Education Provided To: Patient Education Topics Provided Wound/Skin Impairment: Methods: Explain/Verbal Responses: Reinforcements needed, State content correctly Electronic Signature(s) Signed: 10/07/2022 4:24:30 PM By: Rhae Hammock RN Entered By: Rhae Hammock on 10/05/2022 10:34:53 -------------------------------------------------------------------------------- Vitals Details Patient Name: Date of Service: [name], [name] 10/05/2022 8:00 A M Medical Record Number: [id number] Patient Account Number: [id number] Date of Birth/Sex: Treating RN: [birth year] (67 y.o. [name], [name] Primary Care Anel Purohit: [name] Other Clinician: [name] Referring Vee Bahe: Treating Orland Visconti/Extender: [name], [name] in Treatment: 24 Vital Signs Time Taken: 07:31 Temperature (F): 97.7 Height (in): 72 Pulse (bpm): 97 Weight (lbs):  220 Respiratory Rate (breaths/min): 17 Body Mass Index (BMI): 29.8 Blood Pressure (mmHg): 130/81 Capillary Blood Glucose (mg/dl): 181 Reference Range: 80 - 120 mg / dl Electronic Signature(s) Signed: 10/07/2022 4:24:30 PM By: Rhae Hammock RN Entered By: Rhae Hammock on 10/05/2022 09:27:09

## 2022-10-08 ENCOUNTER — Encounter (HOSPITAL_BASED_OUTPATIENT_CLINIC_OR_DEPARTMENT_OTHER): Payer: Medicare HMO | Admitting: General Surgery

## 2022-10-08 DIAGNOSIS — E11621 Type 2 diabetes mellitus with foot ulcer: Secondary | ICD-10-CM | POA: Diagnosis not present

## 2022-10-08 LAB — GLUCOSE, CAPILLARY
Glucose-Capillary: 234 mg/dL — ABNORMAL HIGH (ref 70–99)
Glucose-Capillary: 109 mg/dL — ABNORMAL HIGH (ref 70–99)

## 2022-10-08 NOTE — Progress Notes (Signed)
KIT, DELUCA A ([id number]) [id number] Page 1 of 2 Visit Report for 10/08/2022 HBO Details Patient Name: Date of Service: [name], [name] 10/08/2022 8:00 A M Medical Record Number: [id number] Patient Account Number: [id number] Date of Birth/Sex: Treating RN: 1955/10/10 (67 y.o. [name], [name] Primary Care [name]: [name] Other Clinician: Referring Danyia Borunda: Treating [name]/Extender: Romie Jumper, [name] in Treatment: 25 HBO Treatment Course Details Treatment Course Number: 1 Ordering Ayshia Gramlich: [name] T Treatments Ordered: otal 80 HBO Treatment Start Date: 06/01/2022 HBO Indication: Diabetic Ulcer(s) of the Lower Extremity Standard/Conservative Wound Care tried and failed greater than or equal to 30 days HBO Treatment Details Treatment Number: 79 Patient Type: Outpatient Chamber Type: Monoplace Chamber Serial #: [id number] Treatment Protocol: 2.5 ATA with 90 minutes oxygen, with two 5 minute air breaks Treatment Details Compression Rate Down: 2.0 psi / minute De-Compression Rate Up: 2.0 psi / minute A breaks and breathing ir Compress Tx Pressure periods Decompress Decompress Begins Reached (leave unused spaces Begins Ends blank) Chamber Pressure (ATA 1 2.5 2.5 2.5 2.5 2.5 - - 2.5 1 ) Clock Time (24 hr) 08:05 08:19 08:49 08:54 09:24 09:29 - - 09:59 10:10 Treatment Length: 125 (minutes) Treatment Segments: 4 Vital Signs Capillary Blood Glucose Reference Range: 80 - 120 mg / dl HBO Diabetic Blood Glucose Intervention Range: <131 mg/dl or >249 mg/dl Time Vitals Blood Respiratory Capillary Blood Glucose Pulse Action Type: Pulse: Temperature: Taken: Pressure: Rate: Glucose (mg/dl): Meter #: Oximetry (%) Taken: Pre 07:35 137/89 85 18 97.9 234 Post 10:12 160/84 70 18 97.6 109 Treatment Response Treatment Toleration: Well Treatment Completion Status: Treatment Completed without Adverse Event Additional  Procedure Documentation Tissue Sevierity: Necrosis of bone Physician HBO Attestation: I certify that I supervised this HBO treatment in accordance with Medicare guidelines. A trained emergency response team is readily available per Yes hospital policies and procedures. Continue HBOT as ordered. Yes Electronic Signature(s) Signed: 10/08/2022 12:44:25 PM By: Fredirick Maudlin MD FACS Entered By: Fredirick Maudlin on 10/08/2022 12:44:24 -------------------------------------------------------------------------------- HBO Safety Checklist Details Patient Name: Date of Service: [name], [name] 10/08/2022 8:00 A M Medical Record Number: [id number] Patient Account Number: [id number] Date of Birth/Sex: Treating RN: 1955-11-03 (67 y.o. [name] Primary Care Clinton Wahlberg: [name] Other Clinician: Referring Syna Gad: Treating Gurvir Schrom/Extender: [name], [name] ([id number]) [id number] Page 2 of 2 Weeks in Treatment: 25 HBO Safety Checklist Items Safety Checklist Consent Form Signed Patient voided / foley secured and emptied 0700-turkey sausage, egg, cheese biscuit and When did you last eato OJ Last dose of injectable or oral agent 0625 Ostomy pouch emptied and vented if applicable NA All implantable devices assessed, documented and approved NA Intravenous access site secured and place NA Valuables secured Linens and cotton and cotton/polyester blend (less than 51% polyester) Personal oil-based products / skin lotions / body lotions removed Wigs or hairpieces removed NA Smoking or tobacco materials removed NA Books / newspapers / magazines / loose paper removed NA Cologne, aftershave, perfume and deodorant removed NA Jewelry removed (may wrap wedding band) left hand ring finger ring covered Make-up removed NA Hair care products removed NA Battery operated devices (external) removed NA Heating patches and chemical  warmers removed NA Titanium eyewear removed NA Nail polish cured greater than 10 hours NA Casting material cured greater than 10 hours NA Hearing aids removed NA Loose dentures or partials removed NA Prosthetics have been removed NA Patient demonstrates correct use of air break device (if  applicable) Patient concerns have been addressed Patient grounding bracelet on and cord attached to chamber Specifics for Inpatients (complete in addition to above) Medication sheet sent with patient Intravenous medications needed or due during therapy sent with patient Drainage tubes (e.g. nasogastric tube or chest tube secured and vented) Endotracheal or Tracheotomy tube secured Cuff deflated of air and inflated with saline Airway suctioned Electronic Signature(s) Signed: 10/08/2022 4:19:15 PM By: [name] RN, [name] Entered By: [name] on 10/08/2022 08:31:53

## 2022-10-08 NOTE — Progress Notes (Signed)
LASZLO, FORNWALT [name] ([id number]) [id number] Page 1 of 8 Visit Report for 10/08/2022 Arrival Information Details Patient Name: Date of Service: [name], [name] 10/08/2022 10:15 [name] Medical Record Number: [id number] Patient Account Number: [id number] Date of Birth/Sex: Treating RN: Oct 27, 1955 (67 y.o. M) Primary Care Casper Pagliuca: [name] Other Clinician: Referring Letty Salvi: Treating Ziza Hastings/Extender: [name], [name] in Treatment: 25 Visit Information History Since Last Visit All ordered tests and consults were completed: No Patient Arrived: Wheel Chair Added or deleted any medications: No Arrival Time: 10:22 Any new allergies or adverse reactions: No Accompanied By: self Had [name] fall or experienced change in No Transfer Assistance: None activities of daily living that may affect Patient Identification Verified: Yes risk of falls: Secondary Verification Process Completed: Yes Signs or symptoms of abuse/neglect since last visito No Patient Requires Transmission-Based Precautions: No Hospitalized since last visit: No Patient Has Alerts: Yes Implantable device outside of the clinic excluding No cellular tissue based products placed in the center since last visit: Pain Present Now: No Electronic Signature(s) Signed: 10/08/2022 11:16:23 AM By: Worthy Rancher Entered By: Worthy Rancher on 10/08/2022 10:23:05 -------------------------------------------------------------------------------- Encounter Discharge Information Details Patient Name: Date of Service: [name], [name] 10/08/2022 10:15 [name] Medical Record Number: [id number] Patient Account Number: [id number] Date of Birth/Sex: Treating RN: 12-Apr-1956 (67 y.o. [name] Primary Care Ramiro Pangilinan: [name] Other Clinician: Referring [name]: Treating [name]/Extender: [name], [name] in Treatment: 25 Encounter Discharge Information Items Post Procedure  Vitals Discharge Condition: Stable Temperature (F): 97.6 Ambulatory Status: Ambulatory Pulse (bpm): 70 Discharge Destination: Home Respiratory Rate (breaths/min): 18 Transportation: Private Auto Blood Pressure (mmHg): 160/84 Accompanied By: self Schedule Follow-up Appointment: Yes Clinical Summary of Care: Patient Declined Electronic Signature(s) Signed: 10/08/2022 3:43:37 PM By: Sharyn Creamer RN, [name] Entered By: Sharyn Creamer on 10/08/2022 11:04:05 -------------------------------------------------------------------------------- Lower Extremity Assessment Details Patient Name: Date of Service: [name], [name] 10/08/2022 10:15 [name] Medical Record Number: [id number] Patient Account Number: [id number] Date of Birth/Sex: Treating RN: 1956/02/02 (67 y.o. [name] Primary Care Patrcia Schnepp: [name] Other Clinician: Referring Eluzer Howdeshell: Treating Aerion Bagdasarian/Extender: [name], [name] in Treatment: 25 Edema Assessment Assessed: [Left: No] [Right: No] T[LeftAEDIN, SAUCER [phone] [[id number] Page 2 of 8] Edema: [Left: No] [Right: No] Calf Left: Right: Point of Measurement: 36 cm From Medial Instep 39 cm 38 cm Ankle Left: Right: Point of Measurement: 9 cm From Medial Instep 22 cm 22.5 cm Vascular Assessment Pulses: Dorsalis Pedis Palpable: [Left:Yes] [Right:Yes] Electronic Signature(s) Signed: 10/08/2022 3:43:37 PM By: Sharyn Creamer RN, [name] Entered By: Sharyn Creamer on 10/08/2022 10:28:43 -------------------------------------------------------------------------------- Multi Wound Chart Details Patient Name: Date of Service: [name], [name] 10/08/2022 10:15 [name] Medical Record Number: [id number] Patient Account Number: [id number] Date of Birth/Sex: Treating RN: 09/09/1955 (67 y.o. M) Primary Care [name]: [name] Other Clinician: Referring Kreston Ahrendt: Treating [name]/Extender: [name], [name] in Treatment: 25 Vital Signs Height(in): 72 Capillary Blood Glucose(mg/dl): 109 Weight(lbs): 220 Pulse(bpm): 73 Body Mass Index(BMI): 29.8 Blood Pressure(mmHg): 160/84 Temperature(F): 97.6 Respiratory Rate(breaths/min): 18 [5:Photos:] [[name]:[name]] Left, Lateral Foot Right Amputation Site - [name] Wound Location: Transmetatarsal Surgical Injury Gradually Appeared [name] Wounding Event: Diabetic Wound/Ulcer of the Lower Diabetic Wound/Ulcer of the Lower [name] Primary Etiology: Extremity Extremity Open Surgical Wound [name] [name] Secondary Etiology: Asthma, Sleep Apnea, Hypertension, Asthma, Sleep Apnea, Hypertension, [name] Comorbid History: Peripheral Venous Disease, Type II Peripheral Venous Disease, Type II  Diabetes, Osteomyelitis, Neuropathy Diabetes, Osteomyelitis, Neuropathy 12/20/2021 07/16/2022 [name] Date Acquired: 25 12 [name] [name]: Open Open [name] Wound Status: No No [name] Wound Recurrence: 0.6x0.4x0.4 0.3x0.3x0.4 [name] Measurements L x W x D (cm) 0.188 0.071 [name] [name] (cm) : rea 0.075 0.028 [name] Volume (cm) : 98.10% 92.80% [name] % Reduction in [name] rea: 98.50% 97.10% [name] % Reduction in Volume: Grade 3 Grade 2 [name] Classification: Medium Small [name] Exudate [name] mount: Serosanguineous Serosanguineous [name] Exudate Type: red, brown red, brown [name] Exudate Color: Epibole Distinct, outline attached [name] Wound Margin: Large (67-100%) Large (67-100%) [name] Granulation [name] mount: Red Red [name] Granulation QualitySHELDON, LANGSETH [name] ([id number]) [id number].pdf Page 3 of 8 Small (1-33%) Small (1-33%) [name] Necrotic Amount: Fat Layer (Subcutaneous Tissue): Yes Fat Layer (Subcutaneous Tissue): Yes [name] Exposed Structures: Fascia: No Fascia: No Tendon: No Tendon: No Muscle: No Muscle: No Joint: No Joint: No Bone: No Bone: No Large (67-100%) Small (1-33%) [name] Epithelialization: Debridement - Selective/Open Wound Debridement -  Selective/Open Wound [name] Debridement: Pre-procedure Verification/Time Out 10:35 10:35 [name] Taken: Lidocaine 4% T opical Solution Lidocaine 4% T opical Solution [name] Pain Control: Callus, Slough Callus, Slough [name] Tissue Debrided: Non-Viable Tissue Non-Viable Tissue [name] Level: 0.4 0.09 [name] Debridement [name] (sq cm): rea Curette Curette [name] Instrument: Minimum Minimum [name] Bleeding: Pressure Pressure [name] Hemostasis [name] chieved: 0 0 [name] Procedural Pain: 0 0 [name] Post Procedural Pain: Procedure was tolerated well Procedure was tolerated well [name] Debridement Treatment Response: 0.6x0.4x0.4 0.3x0.3x0.4 [name] Post Debridement Measurements L x W x D (cm) 0.075 0.028 [name] Post Debridement Volume: (cm) Callus: Yes Callus: Yes [name] Periwound Skin Texture: Excoriation: No Excoriation: No Induration: No Induration: No Crepitus: No Crepitus: No Rash: No Rash: No Scarring: No Scarring: No Maceration: No Dry/Scaly: Yes [name] Periwound Skin Moisture: Dry/Scaly: No Maceration: No Atrophie Blanche: No Atrophie Blanche: No [name] Periwound Skin Color: Cyanosis: No Cyanosis: No Ecchymosis: No Ecchymosis: No Erythema: No Erythema: No Hemosiderin Staining: No Hemosiderin Staining: No Mottled: No Mottled: No Pallor: No Pallor: No Rubor: No Rubor: No No Abnormality No Abnormality [name] Temperature: Yes [name] [name] Tenderness on Palpation: Cellular or Tissue Based Product Cellular or Tissue Based Product [name] Procedures Performed: Debridement Debridement Treatment Notes Wound #5 (Foot) Wound Laterality: Left, Lateral Cleanser Peri-Wound Care Topical Primary Dressing Secondary Dressing Secured With Compression Wrap Compression Stockings Add-Ons Wound #6 (Amputation Site - Transmetatarsal) Wound Laterality: Right Cleanser Peri-Wound Care Topical Primary Dressing Secondary Dressing Secured With Compression Wrap Compression Stockings Add-Ons DAKKOTA, SPICKARD [name] ([id number])  [id number].pdf Page 4 of 8 Electronic Signature(s) Signed: 10/08/2022 11:54:45 AM By: Fredirick Maudlin MD FACS Entered By: Fredirick Maudlin on 10/08/2022 11:54:44 -------------------------------------------------------------------------------- Multi-Disciplinary Care Plan Details Patient Name: Date of Service: [name], [name] 10/08/2022 10:15 [name] Medical Record Number: [id number] Patient Account Number: [id number] Date of Birth/Sex: Treating RN: February 07, 1956 (67 y.o. [name] Primary Care Naara Kelty: [name] Other Clinician: Referring Lasalle Abee: Treating Galen Russman/Extender: Delton See in Treatment: 25 Multidisciplinary Care Plan reviewed with physician Active Inactive Osteomyelitis Nursing Diagnoses: Infection: osteomyelitis Goals: Diagnostic evaluation for osteomyelitis completed as ordered Date Initiated: 04/23/2022 Date Inactivated: 10/08/2022 Target Resolution Date: 10/09/2022 Goal Status: Met Signs and symptoms for osteomyelitis will be recognized and promptly addressed Date Initiated: 04/23/2022 Target Resolution Date: 10/23/2022 Goal Status: Active Interventions: Assess for signs and symptoms of osteomyelitis resolution every visit Provide education on osteomyelitis Screen for HBO Treatment Activities: Biopsy : 04/16/2022 Consult for HBO : 04/23/2022 Surgical debridement : 04/23/2022  Systemic antibiotics : 04/23/2022 T ordered outside of clinic : 04/23/2022 est Notes: Wound/Skin Impairment Nursing Diagnoses: Knowledge deficit related to ulceration/compromised skin integrity Goals: Patient/caregiver will verbalize understanding of skin care regimen Date Initiated: 04/14/2022 Target Resolution Date: 11/06/2022 Goal Status: Active Interventions: Assess patient/caregiver ability to perform ulcer/skin care regimen upon admission and as needed Assess ulceration(s) every visit Provide education on ulcer and skin  care Notes: Electronic Signature(s) Signed: 10/08/2022 3:43:37 PM By: Sharyn Creamer RN, [name] Entered By: Sharyn Creamer on 10/08/2022 10:34:07 [name] ([id number] Page 5 of 8 -------------------------------------------------------------------------------- Pain Assessment Details Patient Name: Date of Service: [name], [name] 10/08/2022 10:15 [name] Medical Record Number: [id number] Patient Account Number: [id number] Date of Birth/Sex: Treating RN: 07/28/1955 (67 y.o. M) Primary Care Alizon Schmeling: [name] Other Clinician: Referring Elisabeth Strom: Treating Shalona Harbour/Extender: [name], [name] in Treatment: 25 Active Problems Location of Pain Severity and Description of Pain Patient Has Paino No Site Locations Pain Management and Medication Current Pain Management: Electronic Signature(s) Signed: 10/08/2022 11:16:23 AM By: Worthy Rancher Entered By: Worthy Rancher on 10/08/2022 10:23:46 -------------------------------------------------------------------------------- Patient/Caregiver Education Details Patient Name: Date of Service: [name], Vinetta Bergamo 4/4/2024andnbsp10:15 [name] Medical Record Number: [id number] Patient Account Number: [id number] Date of Birth/Gender: Treating RN: 1956-02-14 (67 y.o. [name] Primary Care Physician: [name] Other Clinician: Referring Physician: Treating Physician/Extender: Delton See in Treatment: 25 Education Assessment Education Provided To: Patient Education Topics Provided Wound/Skin Impairment: Methods: Explain/Verbal Responses: State content correctly [employer]) Signed: 10/08/2022 3:43:37 PM By: Sharyn Creamer RN, [name] Entered By: Sharyn Creamer on 10/08/2022 10:33:25 Wound Assessment Details -------------------------------------------------------------------------------- [name] ([id number] Page 6 of 8 Patient Name: Date of Service: [name], [name] 10/08/2022 10:15 [name] Medical Record Number: [id number] Patient Account Number: [id number] Date of Birth/Sex: Treating RN: 07-19-1955 (67 y.o. [name] Primary Care Sadako Cegielski: [name] Other Clinician: Referring Ceasia Elwell: Treating Holland Nickson/Extender: [name], [name] in Treatment: 25 Wound Status Wound Number: 5 Primary Diabetic Wound/Ulcer of the Lower Extremity Etiology: Wound Location: Left, Lateral Foot Secondary Open Surgical Wound Wounding Event: Surgical Injury Etiology: Date Acquired: 12/20/2021 Wound Open [name]: 25 Status: Clustered Wound: No Comorbid Asthma, Sleep Apnea, Hypertension, Peripheral Venous Disease, History: Type II Diabetes, Osteomyelitis, Neuropathy Photos Wound Measurements Length: (cm) 0.6 % Reduction in Area: 98.1% Width: (cm) 0.4 % Reduction in Volume: 98.5% Depth: (cm) 0.4 Epithelialization: Large (67-100%) Area: (cm) 0.188 Tunneling: No Volume: (cm) 0.075 Undermining: No Wound Description Classification: Grade 3 Foul Odor After Cleansing: No Wound Margin: Epibole Slough/Fibrino No Exudate Amount: Medium Exudate Type: Serosanguineous Exudate Color: red, brown Wound Bed Granulation Amount: Large (67-100%) Exposed Structure Granulation Quality: Red Fascia Exposed: No Necrotic Amount: Small (1-33%) Fat Layer (Subcutaneous Tissue) Exposed: Yes Necrotic Quality: Adherent Slough Tendon Exposed: No Muscle Exposed: No Joint Exposed: No Bone Exposed: No Periwound Skin Texture Texture Color No Abnormalities Noted: No No Abnormalities Noted: Yes Callus: Yes Temperature / Pain Crepitus: No Temperature: No Abnormality Excoriation: No Tenderness on Palpation: Yes Induration: No Rash: No Scarring: No Moisture No Abnormalities Noted: No Dry / Scaly: No Maceration: No Treatment Notes Wound #5  (Foot) Wound Laterality: Left, Lateral Cleanser MAHARI, ZELADA [name] ([id number]) [phone].pdf Page 7 of 8 Peri-Wound Care Topical Primary Dressing Secondary Dressing Secured With Compression Wrap Compression Stockings Add-Ons Electronic Signature(s) Signed: 10/08/2022 11:16:23 AM By: Worthy Rancher Signed: 10/08/2022 3:43:37 PM By: Sharyn Creamer RN,  [name] Entered By: Worthy Rancher on 10/08/2022 10:33:33 -------------------------------------------------------------------------------- Wound Assessment Details Patient Name: Date of Service: [name], [name] 10/08/2022 10:15 [name] Medical Record Number: [id number] Patient Account Number: [id number] Date of Birth/Sex: Treating RN: [birth year] (67 y.o. [name] Primary Care Danne Vasek: [name] Other Clinician: Referring Danile Trier: Treating Tawanda Schall/Extender: [name], [name] in Treatment: 25 Wound Status Wound Number: 6 Primary Diabetic Wound/Ulcer of the Lower Extremity Etiology: Wound Location: Right Amputation Site - Transmetatarsal Wound Open Wounding Event: Gradually Appeared Status: Date Acquired: 07/16/2022 Comorbid Asthma, Sleep Apnea, Hypertension, Peripheral Venous Disease, [name]: 12 History: Type II Diabetes, Osteomyelitis, Neuropathy Clustered Wound: No Photos Wound Measurements Length: (cm) 0.3 Width: (cm) 0.3 Depth: (cm) 0.4 Area: (cm) 0.071 Volume: (cm) 0.028 % Reduction in Area: 92.8% % Reduction in Volume: 97.1% Epithelialization: Small (1-33%) Tunneling: No Undermining: No Wound Description Classification: Grade 2 Wound Margin: Distinct, outline attached Exudate Amount: Small Exudate Type: Serosanguineous Exudate Color: red, brown Foul Odor After Cleansing: No Slough/Fibrino Yes Wound Bed Granulation Amount: Large (67-100%) Exposed Structure Granulation Quality: Red Fascia Exposed: No Necrotic Amount: Small (1-33%) Fat Layer  (Subcutaneous Tissue) Exposed: Yes Necrotic Quality: Adherent Slough Tendon Exposed: No Muscle Exposed: No Joint Exposed: No BERKLEY, MAYBIN [name] ([id number].pdf Page 8 of 8 Bone Exposed: No Periwound Skin Texture Texture Color No Abnormalities Noted: No No Abnormalities Noted: Yes Callus: Yes Temperature / Pain Crepitus: No Temperature: No Abnormality Excoriation: No Induration: No Rash: No Scarring: No Moisture No Abnormalities Noted: No Dry / Scaly: Yes Maceration: No Treatment Notes Wound #6 (Amputation Site - Transmetatarsal) Wound Laterality: Right Cleanser Peri-Wound Care Topical Primary Dressing Secondary Dressing Secured With Compression Wrap Compression Stockings Add-Ons Electronic Signature(s) Signed: 10/08/2022 11:16:23 AM By: Worthy Rancher Signed: 10/08/2022 3:43:37 PM By: Sharyn Creamer RN, [name] Entered By: Worthy Rancher on 10/08/2022 10:32:09 -------------------------------------------------------------------------------- Vitals Details Patient Name: Date of Service: [name], [name] 10/08/2022 10:15 [name] Medical Record Number: [id number] Patient Account Number: [id number] Date of Birth/Sex: Treating RN: [birth year] (67 y.o. M) Primary Care Nettie Wyffels: [name] Other Clinician: Referring Gardiner Espana: Treating Krysta Bloomfield/Extender: [name], [name] in Treatment: 25 Vital Signs Time Taken: 10:23 Temperature (F): 97.6 Height (in): 72 Pulse (bpm): 70 Weight (lbs): 220 Respiratory Rate (breaths/min): 18 Body Mass Index (BMI): 29.8 Blood Pressure (mmHg): 160/84 Capillary Blood Glucose (mg/dl): 109 Reference Range: 80 - 120 mg / dl Electronic Signature(s) Signed: 10/08/2022 11:16:23 AM By: Worthy Rancher Entered By: Worthy Rancher on 10/08/2022 10:23:37

## 2022-10-08 NOTE — Progress Notes (Addendum)
[name], [name] [name] ([id number]) [id number] Page 1 of 14 Visit Report for 10/08/2022 Chief Complaint Document Details Patient Name: Date of Service: [name], [name] 10/08/2022 10:15 [name] Medical Record Number: [id number] Patient Account Number: [id number] Date of Birth/Sex: Treating RN: 03-16-1956 (67 y.o. M) Primary Care Provider: Roselee [name] Other Clinician: Referring Provider: Treating Provider/Extender: [name], [name] in Treatment: 25 Information Obtained from: Patient Chief Complaint 10/16/20; patient is here with [name] substantial wound on his right foot in the setting of [name] recent transmetatarsal amputation. 04/14/2022; referral from podiatry for postsurgical wound of nonhealing partial left fifth ray amputation, right foot (07/16/22): TMA dehiscence Electronic Signature(s) Signed: 10/08/2022 11:54:54 AM By: [name] Maudlin MD FACS Entered By: [name] on 10/08/2022 11:54:53 -------------------------------------------------------------------------------- Cellular or Tissue Based Product Details Patient Name: Date of Service: [name], [name] 10/08/2022 10:15 [name] Medical Record Number: [id number] Patient Account Number: [id number] Date of Birth/Sex: Treating RN: Aug 31, 1955 (67 y.o. [name] Primary Care Provider: Roselee [name] Other Clinician: Referring Provider: Treating Provider/Extender: [name] in Treatment: 25 Cellular or Tissue Based Product Type Wound #5 Left,Lateral Foot Applied to: Performed By: Physician [name] Maudlin, MD Cellular or Tissue Based Product Type: Grafix prime Level of Consciousness (Pre-procedure): Awake and Alert Pre-procedure Verification/Time Out Yes - 10:35 Taken: Location: genitalia / hands / feet / multiple digits Wound Size (sq cm): 0.24 Product Size (sq cm): 6 Waste Size (sq cm): 4 Waste Reason: wound size, remainder for other wound Amount of  Product Applied (sq cm): 2 Instrument Used: Forceps Lot #: [id number] Expiration Date: 09/16/2023 Fenestrated: No Reconstituted: Yes Solution Type: normal saline Solution Amount: 3 ml Lot #: [id number] Solution Expiration Date: 12/03/2024 Secured: Yes Secured With: Steri-Strips Dressing Applied: Yes Primary Dressing: Adaptic, Gauze Procedural Pain: 0 Post Procedural Pain: 0 Response to Treatment: Procedure was tolerated well Level of Consciousness (Post- Awake and Alert procedure): Post Procedure Diagnosis Same as Pre-procedure Notes Scribed for Dr [name] by [name] Creamer, RN [name] ([id number]) [phone].pdf Page 2 of 14 Electronic Signature(s) Signed: 10/08/2022 12:44:03 PM By: [name] Maudlin MD FACS Signed: 10/08/2022 3:43:37 PM By: [name] Creamer RN, [name] Entered By: [name] on 10/08/2022 10:51:25 -------------------------------------------------------------------------------- Cellular or Tissue Based Product Details Patient Name: Date of Service: [name], [name] 10/08/2022 10:15 [name] Medical Record Number: [id number] Patient Account Number: [id number] Date of Birth/Sex: Treating RN: [birth year] (67 y.o. [name] Primary Care Provider: Roselee [name] Other Clinician: Referring Provider: Treating Provider/Extender: [name] in Treatment: 25 Cellular or Tissue Based Product Type Wound #6 Right Amputation Site - Transmetatarsal Applied to: Performed By: Physician [name] Maudlin, MD Cellular or Tissue Based Product Type: Grafix prime Level of Consciousness (Pre-procedure): Awake and Alert Pre-procedure Verification/Time Out Yes - 10:35 Taken: Location: genitalia / hands / feet / multiple digits Wound Size (sq cm): 0.09 Product Size (sq cm): 6 Waste Size (sq cm): 4 Waste Reason: wound size, remainder for other wound Amount of Product Applied (sq cm): 2 Instrument Used: Forceps Lot #:  [id number] Expiration Date: 09/16/2023 Fenestrated: No Reconstituted: Yes Solution Type: normal saline Solution Amount: 3 ml Lot #: [id number] Solution Expiration Date: 12/03/2024 Secured: Yes Secured With: Steri-Strips Dressing Applied: Yes Primary Dressing: Adaptic, Gauze Procedural Pain: 0 Post Procedural Pain: 0 Response to Treatment: Procedure was tolerated well Level of Consciousness (Post- Awake and Alert procedure): Post Procedure Diagnosis Same as Pre-procedure Notes Scribed for Dr [name]  by [name] Creamer, RN Electronic Signature(s) Signed: 10/08/2022 12:44:03 PM By: [name] Maudlin MD FACS Signed: 10/08/2022 3:43:37 PM By: [name] Creamer RN, [name] Entered By: [name] on 10/08/2022 10:51:40 -------------------------------------------------------------------------------- Debridement Details Patient Name: Date of Service: [name], [name] 10/08/2022 10:15 [name] Medical Record Number: [id number] Patient Account Number: [id number] Date of Birth/Sex: Treating RN: [birth year] (67 y.o. [name] Primary Care Provider: Roselee [name] Other Clinician: Referring Provider: Treating Provider/Extender: [name] in Treatment: 25 Debridement Performed for Assessment: Wound #5 Left,Lateral Foot Performed By: Physician [name] Maudlin, MD Debridement Type: Debridement Severity of Tissue Pre Debridement: Fat layer exposed [name], [name] ([id number]) [id number].pdf Page 3 of 14 Level of Consciousness (Pre-procedure): Awake and Alert Pre-procedure Verification/Time Out Yes - 10:35 Taken: Start Time: 10:40 Pain Control: Lidocaine 4% T opical Solution T Area Debrided (L x W): otal 1 (cm) x 0.4 (cm) = 0.4 (cm) Tissue and other material debrided: Non-Viable, Callus, Slough, Slough Level: Non-Viable Tissue Debridement Description: Selective/Open Wound Instrument: Curette Bleeding: Minimum Hemostasis Achieved:  Pressure Procedural Pain: 0 Post Procedural Pain: 0 Response to Treatment: Procedure was tolerated well Level of Consciousness (Post- Awake and Alert procedure): Post Debridement Measurements of Total Wound Length: (cm) 0.6 Width: (cm) 0.4 Depth: (cm) 0.4 Volume: (cm) 0.075 Character of Wound/Ulcer Post Debridement: Improved Severity of Tissue Post Debridement: Fat layer exposed Post Procedure Diagnosis Same as Pre-procedure Notes Scribed for Dr [name] by [name] Electronic Signature(s) Signed: 10/08/2022 12:44:03 PM By: [name] Maudlin MD FACS Signed: 10/08/2022 3:43:37 PM By: [name] Creamer RN, [name] Entered By: [name] on 10/08/2022 10:42:34 -------------------------------------------------------------------------------- Debridement Details Patient Name: Date of Service: [name], [name] 10/08/2022 10:15 [name] Medical Record Number: [id number] Patient Account Number: [id number] Date of Birth/Sex: Treating RN: April 13, 1956 (67 y.o. [name] Primary Care Provider: Roselee [name] Other Clinician: Referring Provider: Treating Provider/Extender: [name], [name] in Treatment: 25 Debridement Performed for Assessment: Wound #6 Right Amputation Site - Transmetatarsal Performed By: Physician [name] Maudlin, MD Debridement Type: Debridement Severity of Tissue Pre Debridement: Fat layer exposed Level of Consciousness (Pre-procedure): Awake and Alert Pre-procedure Verification/Time Out Yes - 10:35 Taken: Start Time: 10:40 Pain Control: Lidocaine 4% T opical Solution T Area Debrided (L x W): otal 0.3 (cm) x 0.3 (cm) = 0.09 (cm) Tissue and other material debrided: Non-Viable, Callus, Slough, Slough Level: Non-Viable Tissue Debridement Description: Selective/Open Wound Instrument: Curette Bleeding: Minimum Hemostasis Achieved: Pressure Procedural Pain: 0 Post Procedural Pain: 0 Response to Treatment: Procedure was tolerated well Level of  Consciousness (Post- Awake and Alert procedure): Post Debridement Measurements of Total Wound Length: (cm) 0.3 Width: (cm) 0.3 Depth: (cm) 0.4 Volume: (cm) 0.028 Abramo, Cayce [name] ([id number] Page 4 of 14 Character of Wound/Ulcer Post Debridement: Improved Severity of Tissue Post Debridement: Fat layer exposed Post Procedure Diagnosis Same as Pre-procedure Notes Scribed for Dr [name] by [name] Creamer, RN Electronic Signature(s) Signed: 10/08/2022 12:44:03 PM By: [name] Maudlin MD FACS Signed: 10/08/2022 3:43:37 PM By: [name] Creamer RN, [name] Entered By: [name] on 10/08/2022 10:43:23 -------------------------------------------------------------------------------- HPI Details Patient Name: Date of Service: [name], [name] 10/08/2022 10:15 [name] Medical Record Number: [id number] Patient Account Number: [id number] Date of Birth/Sex: Treating RN: [birth year] (67 y.o. M) Primary Care Provider: Roselee [name] Other Clinician: Referring Provider: Treating Provider/Extender: [name], [name] in Treatment: 25 History of Present Illness HPI Description: ADMISSION 10/16/20 This is [name] 67 year old man who is  [name] type II diabetic. Initially seen by Dr. Unk Lightning of vein and vascular on 09/10/2021 for bilateral lower extremity rest pain right greater than left. His ABIs demonstrated monophasic waveforms at the ankles bilaterally with depressed toe pressures. His ABI in the right was 0.5 and on the left 0.28. There were no obtainable waveforms for TBI's. He underwent angiography on 09/10/2021 and had findings of severe PAD with 95% stenosis of the distal SFA. He also had peroneal and posterior tibial arteries occluded with significant collateralization in the calf there was reconstitution of the posterior tibial artery in the distal third of the calf. Ostia of the anterior tibial artery was not appreciated. He underwent [name] angioplasty of the  superficial femoral artery. He required admission to hospital from 09/12/2021 through 09/22/2021 with right foot cellulitis and sepsis. On 3/13 he underwent [name] right below-knee popliteal to posterior tibial bypass using [name] greater saphenous vein. Ultimately however he required [name] right TMA by podiatry which I believe was done on 3/17 for progressive diabetic foot infection. He was discharged to [facility] skilled facility. He arrives in clinic today with [name] large wound area over the entirety of his amputation site extending proximally. The attempted flap closure of the amputation site and his TMA has failed and the wound extends under the attempt to closure. Still some sutures in place. There is an odor but he is not systemically unwell. He is due for follow-up arterial studies and has an appointment with vascular surgery on 10/28/2021. They are using wet-to-dry dressings at [facility]. Past medical history includes type 2 diabetes with peripheral neuropathy, hypertension, obstructive sleep apnea 4/20; patient presents for follow-up. He is being discharged from his facility at [facility] today. They have been doing Dakin's wet-to-dry dressings. He states that his fiance can help with dressing changes. He is getting [facility] home [facility] to come out 3 times [name] week once he leaves the facility. He is scheduled to [name] vein and vascular on 5/12. He has not seen the wound himself. He puts covers over his face for wound exams. He currently denies systemic signs of infection. 4/28; patient admitted to the clinic 2 weeks ago with [name] dehisced right TMA in the setting of type 2 diabetes with significant PAD. He is status post revascularization. He has been discharged from the nursing home he was at and now is at home using Dakin's wet-to-dry dressings daily he has home health. He denies any systemic signs of infection 5/4; patient presents for follow-up. His fiance and home health have been changing his dressings. He  currently denies systemic signs of infection. He continues to put sheet covers over his face during our wound encounters because he does not want [name] look at the wound. 5/25; Patient presents for follow-up. He missed his last clinic appointment. He had [name] bone biopsy and culture done at last clinic visit that showed acute osteomyelitis with growth of E. coli, stenotrophomonas maltophilia and enteroccocus faecalis. [name] referral to infectious disease was made. He has not heard from them yet. He has been using Dakin's wet-to-dry dressings. He is now more comfortable with looking at the wound bed. 6/6; the patient comes into clinic today with 2 wounds on the left foot which apparently have been there for several months. This was not known to assess but was known by Dr. Donzetta Matters. In fact he underwent an angiogram yesterday. He had [name] laser atherectomy of the left posterior tibial artery with [name] 3 mm balloon angioplasty, also [name] stent of the last  left SFA. 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 This is [name] lot better than the last time I saw it. He has been using Dakin's wet-to-dry. He has an appointment with infectious disease for [name] bone biopsy which showed E. coli, stenotrophomonas and Enterococcus faecalis. The appointment is for tomorrow 04/14/2022 Mr. Shyam Heglund ajuddin is an uncontrolled type 2 diabetic on insulin that presents with [name] left foot ulcer. On 12/18/2021 he required [name] left fifth ray amputation. He had revision of this site on 6/17 by Dr. Posey Pronto, podiatry. He states he has had [name] wound VAC on this area for the past 3 weeks. It is unclear what the prior wound care has been. He has [name] history of osteomyelitis to this area and was treated with IV and oral antibiotics For 6 weeks by Dr. Linus Salmons of infectious disease. At his last follow-up on 02/20/2022 his  CRP was trending down and antibiotics were stopped. He also has [name] history of peripheral arterial disease status post stenting to the left SFA and balloon angioplasty to the left posterior tibial artery On 12/2021. He has follow-up with vein and vascular at the end of the month. He also has [name] wound to the dorsal aspect of the left foot that he has been placing Betadine on. 10/19; patient presents for follow-up. He had [name] bone biopsy of the left foot at last clinic visit showed osteomyelitis. The bone culture showed Enterobacter cloacae, Enterococcus faecalis, Staphylococcus aureus, viridans group strep, actinotigum schaelii. He has been referred to infectious disease And has an appointment on October 25. He has been using Dakin's wet-to-dry dressing to the lateral left foot wound and Hydrofera Blue and Medihoney to the dorsal foot wound. He has no issues or complaints today. He denies systemic signs of infection. [name], [name] ([id number]) [id number] Page 5 of 14 10/26; patient presents for follow-up. He saw Dr. Linus Salmons yesterday with infectious disease and has been prescribed doxycycline and Levaquin for his chronic osteomyelitis. He has been using Dakin's wet-to-dry dressings to the lateral left foot wound and Medihoney and Hydrofera Blue to the dorsal wound. We discussed [name] skin graft for the dorsal foot wound and he would like to proceed with insurance verification. 11/3; patient with [name] wound on the dorsal left foot and [name] substantial postsurgical area on the lateral left foot fifth ray amputation. He is also followed by Dr. Alton Revere of infectious disease. He had [name] bone biopsy from the left lateral foot that showed acute osteomyelitis. Swab culture showed multiple organisms. He has also been revascularized by infectious disease status post left SFA stenting and tibial laser atherectomy and angioplasty. An MRI of the left foot had previously shown osteomyelitis of the fifth  met head we have been using Dakin's wet-to-dry to the left lateral foot and Medihoney and Hydrofera Blue to the wound on the dorsal foot. 11/13; HBO treatment was discussed at last clinic visit and patient would like to proceed with this. He has orientation today. He has been approved for Grafix. We have been using Hydrofera Blue and Medihoney to the left dorsal foot and collagen to the left lateral foot. Patient has home health that comes in for dressing changes. He currently has no issues or complaints today. 12/7; patient missed his last clinic appointment. He was last seen 3 weeks ago.  He is currently off of antibiotics per ID. He completed [name] prolonged treatment. He has been using collagen to the lateral foot wound. He has been using Medihoney and Hydrofera Blue to the dorsal foot wound. He has been doing well with hyperbaric oxygen therapy. He has no issues or complaints today. He denies signs of of infection. 12/14; patient presents for follow-up. We have been using Grafix to the wound beds. He has had trouble equalizing the pressure in his ears during HBO treatments. It was recommended he follow-up with ENT He does not have an appointment yet. He currently denies signs of infection. . 12/21; patient presents for follow-up. He was seen by ENT and had tubes placed. He is continued HBO without issues. We have been using Grafix to the wound beds. There is been improvement in wound healing. 12/21; patient presents for follow-up. He has no issues or complaints today. There is been improvement in wound healing. 1/5; patient presents for follow-up. At last clinic visit Grafix was placed in standard fashion. He has no issues or complaints today. He is tolerating hyperbaric oxygen therapy well. He denies signs of infection. 1/11; patient presents for follow-up. We have been placing Grafix to the wound beds on the left foot. The dorsal left foot wound has healed. The left lateral foot wound has shown  improvement in healing. Unfortunately has had dehiscence of his previous surgical wound site on the right foot. He currently denies signs of infection. 1/19; patient presents for follow-up. Grafix was placed to the left foot wound at last clinic visit. He has been using Dakin's wet-to-dry dressings to the right foot wound. He continues HBO therapy without issues. He has shown improvement in wound healing to the left foot. 1/26; patient presents for follow-up. We been placing Grafix to the left foot wound and patient has been using Dakin's wet-to-dry dressings to the right foot wound. There is been improvement in wound healing. He continues HBO therapy without issues. 2/2; patient presents for follow-up. We have been using Grafix to the left foot wound and Dakin's wet-to-dry dressings to the right foot wound. He has no issues or complaints today. 2/8; patient presents for follow-up. We have been using Grafix to the left foot wound and Dakin's wet-to-dry dressings to the right foot wound. He has no issues or complaints today. He continues HBO without issues. 2/15; patient presents for follow-up. We have been using Grafix to the wound beds. He continues HBO without issues. He has been offloading the wound beds with his surgical shoe. 2/22; patient presents for follow-up. We have been using Grafix to the wound beds. He has no issues or complaints today. He continues HBO therapy without issues. 2/29; patient presents for follow-up. We have been using Grafix to the wound beds. There is been improvement in wound healing. He has no issues or complaints today. He continues HBO therapy. 3/7; patient presents for follow-up. We have been using Grafix to the wound beds. Yesterday patient had an arteriogram to the right lower extremity with [name] drug-eluting stent placed in the superficial femoral artery. He has no issues to report on today. 3/14; patient presents for follow-up. We have been using Grafix to the  wound beds. He continues to do HBO therapy without issues. 3/21; patient presents for follow-up. We have been using Grafix to the wound bed. He continues to do HBO therapy. There continues to be improvement in wound healing. 3/29; patient presents for follow-up. We have been using Grafix to the wound beds.  He continues to do HBO therapy. He has no issues or complaints today. There is been improvement in wound healing. 10/08/2022: Both wounds continue to improve. There is some periwound callus accumulation on both sites. No concern for infection. Electronic Signature(s) Signed: 10/08/2022 11:55:30 AM By: [name] Maudlin MD FACS Entered By: [name] on 10/08/2022 11:55:30 -------------------------------------------------------------------------------- Physical Exam Details Patient Name: Date of Service: [name], [name] 10/08/2022 10:15 [name] Medical Record Number: [id number] Patient Account Number: [id number] Date of Birth/Sex: Treating RN: 07/05/1956 (67 y.o. M) Primary Care Provider: Roselee [name] Other Clinician: Referring Provider: Treating Provider/Extender: [name], [name] in Treatment: 25 Constitutional Hypertensive, asymptomatic. . . . no acute distress. NYSIR, KRISHNAMOORTHY [name] ([id number]) [id number] Page 6 of 14 Respiratory Normal work of breathing on room air. Notes 10/08/2022: Both wounds continue to improve. There is some periwound callus accumulation on both sites. No concern for infection. Electronic Signature(s) Signed: 10/08/2022 11:58:27 AM By: [name] Maudlin MD FACS Entered By: [name] on 10/08/2022 11:58:27 -------------------------------------------------------------------------------- Physician Orders Details Patient Name: Date of Service: [name], [name] 10/08/2022 10:15 [name] Medical Record Number: [id number] Patient Account Number: [id number] Date of Birth/Sex: Treating RN: Apr 23, 1956 (67 y.o. [name] Primary Care Provider: Roselee [name] Other Clinician: Referring Provider: Treating Provider/Extender: [name], [name] in Treatment: 25 Verbal / Phone Orders: No Diagnosis Coding ICD-10 Coding Code Description [id number] Non-pressure chronic ulcer of other part of left foot with fat layer exposed M86.172 Other acute osteomyelitis, left ankle and foot L97.528 Non-pressure chronic ulcer of other part of left foot with other specified severity E11.621 Type 2 diabetes mellitus with foot ulcer E11.42 Type 2 diabetes mellitus with diabetic polyneuropathy S91.301A Unspecified open wound, right foot, initial encounter 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 severity Follow-up Appointments ppointment in 1 week. - Dr. Heber [place] Friday 10/16/2022 1115 after Hyperbarics. Return [name] ppointment in 2 weeks. - Dr Heber [place] Thursday 10/22/2022 @ 10:15 after hyperbarics Return [name] Other: - ****ONLY CHANGE OUTER DRESSINGS *** Anesthetic (In clinic) Topical Lidocaine 5% applied to wound bed Cellular or Tissue Based Products Cellular or Tissue Based Product Type: - Grafix # 3 applied 06/25/22 GRafix # 4 applied 07/02/22 Grafix # 5 applied 07/10/22 Grafix # 6 applied 07/17/22 Grafix # 7 applied 07/24/22---Also, go ahead and run IVR for more Grafix for left lateral foot Grafix #8 applied 07/31/2022 Grafix # 9 applied 08/07/22] left foot Grafix #10 applied 08/13/2022 left and right foot wounds today split the grafix in half. Graftx #11 applied to both wounds. Patient right foot approved for total of 12 between dates 08/18/2022-11/04/2022. Grafix #12 applied to both wounds. grafix #4 right foot split grafix and use to left foot #13. total can have 24. Grafix # 5 applied to right foot split grafix and use to left foot # 14. Grafix #6 right foot and Grafix #16 to left foot applied. Grafix #7 right foot and Grafix #17 left foot applied. Grafix  #8 right foot and Grafix #18 left foot applied Cellular or Tissue Based Product applied to wound bed, secured with steri-strips, cover with Adaptic or Mepitel. (DO NOT REMOVE). Edema Control - Lymphedema / SCD / Other Avoid standing for long periods of time. Off-Loading Open toe surgical shoe to: - wear when standing or walking. Hyperbaric Oxygen Therapy Evaluate for HBO Therapy - 07/24/22- Pt. to finish next 8 HBO treatments 07/24/22- DR. Hoffman wants pt. to be extended  for 40 more treatments Indication: - [place] Grade III to left lateral foot 2.5 ATA for 90 Minutes with 2 Five (5) Minute [name] Breaks ir Total Number of Treatments: - 40; extend for 40 more= 80 treatments One treatments per day (delivered Monday through Friday unless otherwise specified in Special Instructions below): Finger stick Blood Glucose Pre- and Post- HBOT Treatment. Follow Hyperbaric Oxygen Glycemia Protocol Afrin (Oxymetazoline HCL) 0.05% nasal spray - 1 spray in both nostrils daily as needed prior to HBO treatment for difficulty clearing ears [place], [place] [name] ([id number]) [id number] Page 7 of 14 Wound Treatment Patient Medications llergies: gemfibrozil, OxyContin, pork derived (porcine), simvastatin [name] Notifications Medication Indication Start End 10/08/2022 lidocaine DOSE topical 4 % cream - cream topical once daily GLYCEMIA INTERVENTIONS PROTOCOL PRE-HBO GLYCEMIA INTERVENTIONS ACTION INTERVENTION Obtain pre-HBO capillary blood glucose (ensure 1 physician order is in chart). [name]. Notify HBO physician and await physician orders. 2 If result is 70 mg/dl or below: B. If the result meets the hospital definition of [name] critical result, follow hospital policy. [name]. Give patient an 8 ounce Glucerna Shake, an 8 ounce Ensure, or 8 ounces of [name] Glucerna/Ensure equivalent dietary supplement*. B. Wait 30 minutes. If result is 71 mg/dl to 130 mg/dl: C. Retest patients capillary blood  glucose (CBG). D. If result greater than or equal to 110 mg/dl, proceed with HBO. If result less than 110 mg/dl, notify HBO physician and consider holding HBO. If result is 131 mg/dl to 249 mg/dl: [name]. Proceed with HBO. [name]. Notify HBO physician and await physician orders. B. It is recommended to hold HBO and do If result is 250 mg/dl or greater: blood/urine ketone testing. C. If the result meets the hospital definition of [name] critical result, follow hospital policy. POST-HBO GLYCEMIA INTERVENTIONS ACTION INTERVENTION Obtain post HBO capillary blood glucose (ensure 1 physician order is in chart). [name]. Notify HBO physician and await physician orders. 2 If result is 70 mg/dl or below: B. If the result meets the hospital definition of [name] critical result, follow hospital policy. [name]. Give patient an 8 ounce Glucerna Shake, an 8 ounce Ensure, or 8 ounces of [name] Glucerna/Ensure equivalent dietary supplement*. B. Wait 15 minutes for symptoms of If result is 71 mg/dl to 100 mg/dl: hypoglycemia (i.e. nervousness, anxiety, sweating, chills, clamminess, irritability, confusion, tachycardia or dizziness). C. If patient asymptomatic, discharge patient. If patient symptomatic, repeat capillary blood glucose (CBG) and notify HBO physician. If result is 101 mg/dl to 249 mg/dl: [name]. Discharge patient. [name]. Notify HBO physician and await physician orders. B. It is recommended to do blood/urine ketone If result is 250 mg/dl or greater: testing. C. If the result meets the hospital definition of [name] critical result, follow hospital policy. *Juice or candies are NOT equivalent products. If patient refuses the Glucerna or Ensure, please consult the hospital dietitian for an appropriate substitute. Electronic Signature(s) Signed: 10/08/2022 12:44:03 PM By: [name] Maudlin MD FACS Entered By: [name] on 10/08/2022  11:58:57 -------------------------------------------------------------------------------- Problem List Details Patient Name: Date of Service: [name], [name] 10/08/2022 10:15 [name] Medical Record Number: [id number] Patient Account Number: [id number] Date of Birth/Sex: Treating RN: [birth year] (67 y.o. M) Primary Care Provider: Roselee [name] Other Clinician: Referring Provider: Treating Provider/Extender: [name], [name] in Treatment: [address] [name] ([id number]) [id number] Page 8 of 14 Active Problems ICD-10 Encounter Code Description Active Date MDM Diagnosis L97.522 Non-pressure chronic ulcer of other part of left foot with fat layer exposed 04/14/2022 No Yes M86.172 Other  acute osteomyelitis, left ankle and foot 05/08/2022 No Yes L97.528 Non-pressure chronic ulcer of other part of left foot with other specified 04/14/2022 No Yes severity E11.621 Type 2 diabetes mellitus with foot ulcer 04/14/2022 No Yes E11.42 Type 2 diabetes mellitus with diabetic polyneuropathy 04/14/2022 No Yes S91.301A Unspecified open wound, right foot, initial encounter 07/16/2022 No Yes 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 07/16/2022 No Yes severity Inactive Problems Resolved Problems Electronic Signature(s) Signed: 10/08/2022 11:54:36 AM By: [name] Maudlin MD FACS Entered By: [name] on 10/08/2022 11:54:35 -------------------------------------------------------------------------------- Progress Note Details Patient Name: Date of Service: [name], [name] 10/08/2022 10:15 [name] Medical Record Number: [id number] Patient Account Number: [id number] Date of Birth/Sex: Treating RN: Nov 15, 1955 (67 y.o. M) Primary Care Provider: Roselee [name] Other Clinician: Referring Provider: Treating Provider/Extender: [name], [name] in Treatment: 25 Subjective Chief  Complaint Information obtained from Patient 10/16/20; patient is here with [name] substantial wound on his right foot in the setting of [name] recent transmetatarsal amputation. 04/14/2022; referral from podiatry for postsurgical wound of nonhealing partial left fifth ray amputation, right foot (07/16/22): TMA dehiscence History of Present Illness (HPI) ADMISSION 10/16/20 This is [name] 67 year old man who is [name] type II diabetic. Initially seen by Dr. Unk Lightning of vein and vascular on 09/10/2021 for bilateral lower extremity rest pain right greater than left. His ABIs demonstrated monophasic waveforms at the ankles bilaterally with depressed toe pressures. His ABI in the right was 0.5 and on the left 0.28. There were no obtainable waveforms for TBI's. He underwent angiography on 09/10/2021 and had findings of severe PAD with 95% stenosis of the distal SFA. He also had peroneal and posterior tibial arteries occluded with significant collateralization in the calf there was reconstitution of the posterior tibial artery in the distal third of the calf. Ostia of the anterior tibial artery was not appreciated. He underwent [name] angioplasty of the superficial femoral artery. He required admission to hospital from 09/12/2021 through 09/22/2021 with right foot cellulitis and sepsis. On 3/13 he underwent [name] right below-knee popliteal to posterior tibial bypass using [name] greater saphenous vein. Ultimately however he required [name] right TMA by podiatry which I believe was done on 3/17 for progressive diabetic foot infection. He was discharged to [facility] skilled facility. [name], [name] ([id number]) [id number] Page 9 of 14 He arrives in clinic today with [name] large wound area over the entirety of his amputation site extending proximally. The attempted flap closure of the amputation site and his TMA has failed and the wound extends under the attempt to closure. Still some sutures in place. There is an odor but he is  not systemically unwell. He is due for follow-up arterial studies and has an appointment with vascular surgery on 10/28/2021. They are using wet-to-dry dressings at [facility]. Past medical history includes type 2 diabetes with peripheral neuropathy, hypertension, obstructive sleep apnea 4/20; patient presents for follow-up. He is being discharged from his facility at [facility] today. They have been doing Dakin's wet-to-dry dressings. He states that his fiance can help with dressing changes. He is getting [facility] to come out 3 times [name] week once he leaves the facility. He is scheduled to [name] vein and vascular on 5/12. He has not seen the wound himself. He puts covers over his face for wound exams. He currently denies systemic signs of infection. 4/28; patient admitted to the clinic 2 weeks ago with [name] dehisced right TMA in the setting  of type 2 diabetes with significant PAD. He is status post revascularization. He has been discharged from the nursing home he was at and now is at home using Dakin's wet-to-dry dressings daily he has home health. He denies any systemic signs of infection 5/4; patient presents for follow-up. His fiance and home health have been changing his dressings. He currently denies systemic signs of infection. He continues to put sheet covers over his face during our wound encounters because he does not want [name] look at the wound. 5/25; Patient presents for follow-up. He missed his last clinic appointment. He had [name] bone biopsy and culture done at last clinic visit that showed acute osteomyelitis with growth of E. coli, stenotrophomonas maltophilia and enteroccocus faecalis. [name] referral to infectious disease was made. He has not heard from them yet. He has been using Dakin's wet-to-dry dressings. He is now more comfortable with looking at the wound bed. 6/6; the patient comes into clinic today with 2 wounds on the left foot which apparently have been there for several months.  This was not known to assess but was known by Dr. Donzetta Matters. In fact he underwent an angiogram yesterday. He had [name] laser atherectomy of the left posterior tibial artery with [name] 3 mm balloon angioplasty, also [name] stent of the last left SFA. 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 This is [name] lot better than the last time I saw it. He has been using Dakin's wet-to-dry. He has an appointment with infectious disease for [name] bone biopsy which showed E. coli, stenotrophomonas and Enterococcus faecalis. The appointment is for tomorrow 04/14/2022 Mr. Sagan Maione ajuddin is an uncontrolled type 2 diabetic on insulin that presents with [name] left foot ulcer. On 12/18/2021 he required [name] left fifth ray amputation. He had revision of this site on 6/17 by Dr. Posey Pronto, podiatry. He states he has had [name] wound VAC on this area for the past 3 weeks. It is unclear what the prior wound care has been. He has [name] history of osteomyelitis to this area and was treated with IV and oral antibiotics For 6 weeks by Dr. Linus Salmons of infectious disease. At his last follow-up on 02/20/2022 his CRP was trending down and antibiotics were stopped. He also has [name] history of peripheral arterial disease status post stenting to the left SFA and balloon angioplasty to the left posterior tibial artery On 12/2021. He has follow-up with vein and vascular at the end of the month. He also has [name] wound to the dorsal aspect of the left foot that he has been placing Betadine on. 10/19; patient presents for follow-up. He had [name] bone biopsy of the left foot at last clinic visit showed osteomyelitis. The bone culture showed Enterobacter cloacae, Enterococcus faecalis, Staphylococcus aureus, viridans group strep, actinotigum schaelii. He has been referred to infectious disease And has an appointment on October 25. He has been  using Dakin's wet-to-dry dressing to the lateral left foot wound and Hydrofera Blue and Medihoney to the dorsal foot wound. He has no issues or complaints today. He denies systemic signs of infection. 10/26; patient presents for follow-up. He saw Dr. Linus Salmons yesterday with infectious disease and has been prescribed doxycycline and Levaquin for his chronic osteomyelitis. He has been using Dakin's wet-to-dry dressings to the lateral left foot wound and Medihoney and  Hydrofera Blue to the dorsal wound. We discussed [name] skin graft for the dorsal foot wound and he would like to proceed with insurance verification. 11/3; patient with [name] wound on the dorsal left foot and [name] substantial postsurgical area on the lateral left foot fifth ray amputation. He is also followed by Dr. Alton Revere of infectious disease. He had [name] bone biopsy from the left lateral foot that showed acute osteomyelitis. Swab culture showed multiple organisms. He has also been revascularized by infectious disease status post left SFA stenting and tibial laser atherectomy and angioplasty. An MRI of the left foot had previously shown osteomyelitis of the fifth met head we have been using Dakin's wet-to-dry to the left lateral foot and Medihoney and Hydrofera Blue to the wound on the dorsal foot. 11/13; HBO treatment was discussed at last clinic visit and patient would like to proceed with this. He has orientation today. He has been approved for Grafix. We have been using Hydrofera Blue and Medihoney to the left dorsal foot and collagen to the left lateral foot. Patient has home health that comes in for dressing changes. He currently has no issues or complaints today. 12/7; patient missed his last clinic appointment. He was last seen 3 weeks ago. He is currently off of antibiotics per ID. He completed [name] prolonged treatment. He has been using collagen to the lateral foot wound. He has been using Medihoney and Hydrofera Blue to the dorsal foot wound. He has  been doing well with hyperbaric oxygen therapy. He has no issues or complaints today. He denies signs of of infection. 12/14; patient presents for follow-up. We have been using Grafix to the wound beds. He has had trouble equalizing the pressure in his ears during HBO treatments. It was recommended he follow-up with ENT He does not have an appointment yet. He currently denies signs of infection. . 12/21; patient presents for follow-up. He was seen by ENT and had tubes placed. He is continued HBO without issues. We have been using Grafix to the wound beds. There is been improvement in wound healing. 12/21; patient presents for follow-up. He has no issues or complaints today. There is been improvement in wound healing. 1/5; patient presents for follow-up. At last clinic visit Grafix was placed in standard fashion. He has no issues or complaints today. He is tolerating hyperbaric oxygen therapy well. He denies signs of infection. 1/11; patient presents for follow-up. We have been placing Grafix to the wound beds on the left foot. The dorsal left foot wound has healed. The left lateral foot wound has shown improvement in healing. Unfortunately has had dehiscence of his previous surgical wound site on the right foot. He currently denies signs of infection. 1/19; patient presents for follow-up. Grafix was placed to the left foot wound at last clinic visit. He has been using Dakin's wet-to-dry dressings to the right foot wound. He continues HBO therapy without issues. He has shown improvement in wound healing to the left foot. 1/26; patient presents for follow-up. We been placing Grafix to the left foot wound and patient has been using Dakin's wet-to-dry dressings to the right foot wound. There is been improvement in wound healing. He continues HBO therapy without issues. 2/2; patient presents for follow-up. We have been using Grafix to the left foot wound and Dakin's wet-to-dry dressings to the right foot  wound. He has no issues or complaints today. 2/8; patient presents for follow-up. We have been using Grafix to the left foot wound and Dakin's wet-to-dry  dressings to the right foot wound. He has no issues or complaints today. He continues HBO without issues. 2/15; patient presents for follow-up. We have been using Grafix to the wound beds. He continues HBO without issues. He has been offloading the wound beds with his surgical shoe. [name], [name] ([id number]) [id number] Page 10 of 14 2/22; patient presents for follow-up. We have been using Grafix to the wound beds. He has no issues or complaints today. He continues HBO therapy without issues. 2/29; patient presents for follow-up. We have been using Grafix to the wound beds. There is been improvement in wound healing. He has no issues or complaints today. He continues HBO therapy. 3/7; patient presents for follow-up. We have been using Grafix to the wound beds. Yesterday patient had an arteriogram to the right lower extremity with [name] drug-eluting stent placed in the superficial femoral artery. He has no issues to report on today. 3/14; patient presents for follow-up. We have been using Grafix to the wound beds. He continues to do HBO therapy without issues. 3/21; patient presents for follow-up. We have been using Grafix to the wound bed. He continues to do HBO therapy. There continues to be improvement in wound healing. 3/29; patient presents for follow-up. We have been using Grafix to the wound beds. He continues to do HBO therapy. He has no issues or complaints today. There is been improvement in wound healing. 10/08/2022: Both wounds continue to improve. There is some periwound callus accumulation on both sites. No concern for infection. Patient History Information obtained from Patient, Chart. Family History Diabetes - Mother,Siblings, Heart Disease - Siblings, Hypertension - Siblings, Stroke - Mother, No  family history of Cancer, Hereditary Spherocytosis, Kidney Disease, Lung Disease, Seizures, Thyroid Problems, Tuberculosis. Social History Former smoker, Marital Status - Single, Alcohol Use - Never, Drug Use - No History, Caffeine Use - Daily. Medical History Eyes Denies history of Cataracts, Glaucoma, Optic Neuritis Respiratory Patient has history of Asthma, Sleep Apnea Denies history of Aspiration, Chronic Obstructive Pulmonary Disease (COPD), Pneumothorax, Tuberculosis Cardiovascular Patient has history of Hypertension, Peripheral Venous Disease Denies history of Angina, Arrhythmia, Congestive Heart Failure, Coronary Artery Disease, Deep Vein Thrombosis, Hypotension, Myocardial Infarction, Peripheral Arterial Disease, Phlebitis, Vasculitis Endocrine Patient has history of Type II Diabetes Denies history of Type I Diabetes Integumentary (Skin) Denies history of History of Burn Musculoskeletal Patient has history of Osteomyelitis Denies history of Gout, Rheumatoid Arthritis, Osteoarthritis Neurologic Patient has history of Neuropathy Denies history of Dementia, Quadriplegia, Paraplegia, Seizure Disorder Hospitalization/Surgery History - 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 Posey Pronto. - aortogram 12/08/2021 Dr. Virl Cagey [facility]. Medical [name] Surgical History Notes nd Psychiatric PTSD Objective Constitutional Hypertensive, asymptomatic. no acute distress. Vitals Time Taken: 10:23 AM, Height: 72 in, Weight: 220 lbs, BMI: 29.8, Temperature: 97.6 F, Pulse: 70 bpm, Respiratory Rate: 18 breaths/min, Blood Pressure: 160/84 mmHg, Capillary Blood Glucose: 109 mg/dl. Respiratory Normal work of breathing on room air. General Notes: 10/08/2022: Both wounds continue to improve. There is some periwound callus accumulation on both sites. No concern for infection. Integumentary (Hair, Skin) Wound #5 status is Open. Original cause of wound was Surgical Injury. The date acquired was:  12/20/2021. The wound has been in treatment 25 weeks. The wound is located on the Left,Lateral Foot. The wound measures 0.6cm length x 0.4cm width x 0.4cm depth; 0.188cm^2 area and 0.075cm^3 volume. There is Fat Layer (Subcutaneous Tissue) exposed. There is no tunneling or undermining noted. There is [name] medium amount of serosanguineous drainage noted.  The wound margin is epibole. There is large (67-100%) red granulation within the wound bed. There is [name] small (1-33%) amount of necrotic tissue within the wound bed including Adherent Slough. The periwound skin appearance had no abnormalities noted for color. The periwound skin appearance exhibited: Callus. The periwound skin appearance did not exhibit: Crepitus, Excoriation, Induration, Rash, Scarring, Dry/Scaly, Maceration. Periwound temperature was noted as No Abnormality. The periwound has tenderness on palpation. [name], [name] ([id number]) [id number] Page 11 of 14 Wound #6 status is Open. Original cause of wound was Gradually Appeared. The date acquired was: 07/16/2022. The wound has been in treatment 12 weeks. The wound is located on the Right Amputation Site - Transmetatarsal. The wound measures 0.3cm length x 0.3cm width x 0.4cm depth; 0.071cm^2 area and 0.028cm^3 volume. There is Fat Layer (Subcutaneous Tissue) exposed. There is no tunneling or undermining noted. There is [name] small amount of serosanguineous drainage noted. The wound margin is distinct with the outline attached to the wound base. There is large (67-100%) red granulation within the wound bed. There is [name] small (1-33%) amount of necrotic tissue within the wound bed including Adherent Slough. The periwound skin appearance had no abnormalities noted for color. The periwound skin appearance exhibited: Callus, Dry/Scaly. The periwound skin appearance did not exhibit: Crepitus, Excoriation, Induration, Rash, Scarring, Maceration. Periwound temperature was noted  as No Abnormality. Assessment Active Problems ICD-10 Non-pressure chronic ulcer of other part of left foot with fat layer exposed Other acute osteomyelitis, left ankle and foot Non-pressure chronic ulcer of other part of left foot with other specified severity Type 2 diabetes mellitus with foot ulcer Type 2 diabetes mellitus with diabetic polyneuropathy Unspecified open wound, right foot, initial encounter 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 severity Procedures Wound #5 Pre-procedure diagnosis of Wound #5 is [name] Diabetic Wound/Ulcer of the Lower Extremity located on the Left,Lateral Foot .Severity of Tissue Pre Debridement is: Fat layer exposed. There was [name] Selective/Open Wound Non-Viable Tissue Debridement with [name] total area of 0.4 sq cm performed by [name] Maudlin, MD. With the following instrument(s): Curette to remove Non-Viable tissue/material. Material removed includes Callus and Slough and after achieving pain control using Lidocaine 4% T opical Solution. No specimens were taken. [name] time out was conducted at 10:35, prior to the start of the procedure. [name] Minimum amount of bleeding was controlled with Pressure. The procedure was tolerated well with [name] pain level of 0 throughout and [name] pain level of 0 following the procedure. Post Debridement Measurements: 0.6cm length x 0.4cm width x 0.4cm depth; 0.075cm^3 volume. Character of Wound/Ulcer Post Debridement is improved. Severity of Tissue Post Debridement is: Fat layer exposed. Post procedure Diagnosis Wound #5: Same as Pre-Procedure General Notes: Scribed for Dr [name] by [name]. 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 [name] skin graft procedure using [name] bioengineered skin substitute/cellular or tissue based product was performed by [name] Maudlin, MD with the following instrument(s): Forceps. Grafix prime was applied and  secured with Steri-Strips. 2 sq cm of product was utilized and 4 sq cm was wasted due to wound size, remainder for other wound. Post Application, Adaptic, Gauze was applied. [name] Time Out was conducted at 10:35, prior to the start of the procedure. The procedure was tolerated well with [name] pain level of 0 throughout and [name] pain level of 0 following the procedure. Post procedure Diagnosis Wound #5: Same as Pre-Procedure General Notes:  Scribed for Dr [name] by [name] Creamer, RN. 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 There was [name] Selective/Open Wound Non-Viable Tissue Debridement with [name] total area of 0.09 sq cm performed by [name] Maudlin, MD. With the following instrument(s): Curette to remove Non-Viable tissue/material. Material removed includes Callus and Slough and after achieving pain control using Lidocaine 4% Topical Solution. No specimens were taken. [name] time out was conducted at 10:35, prior to the start of the procedure. [name] Minimum amount of bleeding was controlled with Pressure. The procedure was tolerated well with [name] pain level of 0 throughout and [name] pain level of 0 following the procedure. Post Debridement Measurements: 0.3cm length x 0.3cm width x 0.4cm depth; 0.028cm^3 volume. Character of Wound/Ulcer Post Debridement is improved. Severity of Tissue Post Debridement is: Fat layer exposed. Post procedure Diagnosis Wound #6: Same as Pre-Procedure General Notes: Scribed for Dr [name] by [name] Creamer, RN. 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 [name] skin graft procedure using [name] bioengineered skin substitute/cellular or tissue based product was performed by [name] Maudlin, MD with the following instrument(s): Forceps. Grafix prime was  applied and secured with Steri-Strips. 2 sq cm of product was utilized and 4 sq cm was wasted due to wound size, remainder for other wound. Post Application, Adaptic, Gauze was applied. [name] Time Out was conducted at 10:35, prior to the start of the procedure. The procedure was tolerated well with [name] pain level of 0 throughout and [name] pain level of 0 following the procedure. Post procedure Diagnosis Wound #6: Same as Pre-Procedure General Notes: Scribed for Dr [name] by [name] Creamer, RN. Plan Follow-up Appointments: Return Appointment in 1 week. - Dr. Heber [place] Friday 10/16/2022 1115 after Hyperbarics. Return Appointment in 2 weeks. - Dr Heber [place] Thursday 10/22/2022 @ 10:15 after hyperbarics Other: - ****ONLY CHANGE OUTER DRESSINGS *** Anesthetic: (In clinic) Topical Lidocaine 5% applied to wound bed Cellular or Tissue Based Products: Cellular or Tissue Based Product Type: - Grafix # 3 applied 06/25/22 GRafix # 4 applied 07/02/22 Grafix # 5 applied 07/10/22 Grafix # 6 applied 07/17/22 Grafix # 7 applied 07/24/22---Also, go ahead and run IVR for more Grafix for left lateral foot Grafix #8 applied 07/31/2022 Grafix # 9 applied 08/07/22] left foot Grafix #10 applied 08/13/2022 left and right foot wounds today split the grafix in half. Graftx #11 applied to both wounds. Patient right foot approved for total of 12 between dates 08/18/2022-11/04/2022. Grafix #12 applied to both wounds. grafix #4 right foot split grafix and use to left foot #13. total can have 24. Grafix # 5 applied to right foot split grafix and use to left foot # 14. Grafix #6 right foot and Grafix #16 to left foot applied. Grafix #7 right foot and Grafix #17 left foot [name], [name] ([id number]) [phone].pdf Page 12 of 14 applied. Grafix #8 right foot and Grafix #18 left foot applied Cellular or Tissue Based Product applied to wound bed, secured with steri-strips, cover with Adaptic or Mepitel. (DO NOT REMOVE). Edema  Control - Lymphedema / SCD / Other: Avoid standing for long periods of time. Off-Loading: Open toe surgical shoe to: - wear when standing or walking. Hyperbaric Oxygen Therapy: Evaluate for HBO Therapy - 07/24/22- Pt. to finish next 8 HBO treatments 07/24/22- DR. Heber [place] wants  pt. 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 Follow Hyperbaric Oxygen Glycemia Protocol Afrin (Oxymetazoline HCL) 0.05% nasal spray - 1 spray in both nostrils daily as needed prior to HBO treatment for difficulty clearing ears The following medication(s) was prescribed: lidocaine topical 4 % cream cream topical once daily was prescribed at facility 10/08/2022: Both wounds continue to improve. There is some periwound callus accumulation on both sites. No concern for infection. I used [name] curette to debride callus and slough from both wounds. The piece of Grafix was then divided with approximately two thirds of the sheet for the left foot and one third of the sheet for the right. The skin substitute was moistened with saline and packed into each of these wounds. It was secured in situ with Adaptic and Steri-Strips. Gauze sponges were used as bolsters and his feet were wrapped. He will continue his hyperbaric oxygen therapy and follow-up with Dr. Heber [place] next week. Electronic Signature(s) Signed: 10/08/2022 12:00:24 PM By: [name] Maudlin MD FACS Entered By: [name] on 10/08/2022 12:00:24 -------------------------------------------------------------------------------- HxROS Details Patient Name: Date of Service: [name], [name] 10/08/2022 10:15 [name] Medical Record  Number: [id number] Patient Account Number: [id number] Date of Birth/Sex: Treating RN: [birth year] (67 y.o. M) Primary Care Provider: Roselee [name] Other Clinician: Referring Provider: Treating Provider/Extender: [name] in Treatment: 25 Information Obtained From Patient Chart Eyes Medical History: Negative for: Cataracts; Glaucoma; Optic Neuritis Respiratory Medical History: Positive for: Asthma; Sleep Apnea Negative for: Aspiration; Chronic Obstructive Pulmonary Disease (COPD); Pneumothorax; Tuberculosis Cardiovascular Medical History: Positive for: Hypertension; Peripheral Venous Disease Negative for: Angina; Arrhythmia; Congestive Heart Failure; Coronary Artery Disease; Deep Vein Thrombosis; Hypotension; Myocardial Infarction; Peripheral Arterial Disease; Phlebitis; Vasculitis Endocrine Medical History: Positive for: Type II Diabetes Negative for: Type I Diabetes Time with diabetes: 20 years Treated with: Insulin, Oral agents Blood sugar tested every day: Yes Tested : 4x day Integumentary (Skin) Medical History: Negative for: History of Burn [name], [name] ([id number]) [id number] Page 13 of 14 Musculoskeletal Medical History: Positive for: Osteomyelitis Negative for: Gout; Rheumatoid Arthritis; Osteoarthritis Neurologic Medical History: Positive for: Neuropathy Negative for: Dementia; Quadriplegia; Paraplegia; Seizure Disorder Psychiatric Medical History: Past Medical History Notes: PTSD Immunizations Pneumococcal Vaccine: Received Pneumococcal Vaccination: No Implantable Devices None Hospitalization / Surgery History Type of Hospitalization/Surgery 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 Posey Pronto aortogram 12/08/2021 Dr. Virl Cagey [facility] Family and Social History Cancer: No; Diabetes: Yes - Mother,Siblings; Heart Disease: Yes - Siblings; Hereditary Spherocytosis: No; Hypertension: Yes -  Siblings; Kidney Disease: No; Lung Disease: No; Seizures: No; Stroke: Yes - Mother; Thyroid Problems: No; Tuberculosis: No; Former smoker; Marital Status - Single; Alcohol Use: Never; Drug Use: No History; Caffeine Use: Daily; Financial Concerns: No; Food, Clothing or Shelter Needs: No; Support System Lacking: No; Transportation Concerns: No Electronic Signature(s) Signed: 10/08/2022 12:44:03 PM By: [name] Maudlin MD FACS Entered By: [name] on 10/08/2022 11:55:42 -------------------------------------------------------------------------------- SuperBill Details Patient Name: Date of Service: [name], [name] 10/08/2022 Medical Record Number: [id number] Patient Account Number: [id number] Date of Birth/Sex: Treating RN: 1955/12/01 (67 y.o. M) Primary Care Provider: Roselee [name] Other Clinician: Referring Provider:  Treating Provider/Extender: Otho Perl, [name] in Treatment: 25 Diagnosis Coding ICD-10 Codes Code Description [phone] Non-pressure chronic ulcer of other part of left foot with fat layer exposed M86.172 Other acute osteomyelitis, left ankle and foot L97.528 Non-pressure chronic ulcer of other part of left foot with other specified severity E11.621 Type 2 diabetes mellitus with foot ulcer E11.42 Type 2 diabetes mellitus with diabetic polyneuropathy S91.301A Unspecified open wound, right foot, initial encounter 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 severity Facility Procedures : MICHAE, FILIPEK Code: [id number] [name] ([id number] Description: 15275 - SKIN SUB GRAFT FACE/NK/HF/G ICD-10 Diagnosis Description 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 sever  E11.621 Type 2 diabetes mellitus with foot ulcer 0) [id number] Modifier: ity [phone] Quantity: 1 .pdf Page 14  of 14 : CPT4 Code: [id number] Q41 ICD L9 L9 E1 Description: 86 Epifix 2cm x 3cm -10 Diagnosis Description 7.522 Non-pressure chronic ulcer of other part of left foot with fat layer exposed 7.528 Non-pressure chronic ulcer of other part of left foot with other specified severity 1.621 Type 2 diabetes  mellitus with foot ulcer Modifier: 6 Quantity: Physician Procedures : CPT4 Code Description Modifier [id number] 99214 - WC PHYS LEVEL 4 - EST PT 25 ICD-10 Diagnosis Description 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.518 Non-pressure chronic ulcer of other part of right foot  with other specified severity M86.172 Other acute osteomyelitis, left ankle and foot E11.621 Type 2 diabetes mellitus with foot ulcer Quantity: 1 : [id number] 15275 - WC PHYS SKIN SUB GRAFT FACE/NK/HF/G ICD-10 Diagnosis Description 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  severity E11.621 Type 2 diabetes mellitus with foot ulcer Quantity: 1 Electronic Signature(s) Signed: 10/13/2022 12:24:42 PM By: Pearletha Alfred Signed: 10/13/2022 1:31:27 PM By: Duanne Guess MD FACS Previous Signature: 10/08/2022 12:01:25 PM Version By: Duanne Guess MD FACS Entered By: Pearletha Alfred on 10/13/2022 12:24:40

## 2022-10-08 NOTE — Progress Notes (Signed)
HARLY, SIDDALL A ([id number]) [id number] Page 1 of 1 Visit Report for 10/08/2022 SuperBill Details Patient Name: Date of Service: [place], [name] 10/08/2022 Medical Record Number: [id number] Patient Account Number: [id number] Date of Birth/Sex: Treating RN: 1955/11/17 (67 y.o. [name], [name] Primary Care Provider: [name] Other Clinician: Referring Provider: Treating Provider/Extender: Romie Jumper, [name] in Treatment: 25 Diagnosis Coding ICD-10 Codes Code Description [phone] Non-pressure chronic ulcer of other part of left foot with fat layer exposed M86.172 Other acute osteomyelitis, left ankle and foot L97.528 Non-pressure chronic ulcer of other part of left foot with other specified severity E11.621 Type 2 diabetes mellitus with foot ulcer E11.42 Type 2 diabetes mellitus with diabetic polyneuropathy S91.301A Unspecified open wound, right foot, initial encounter 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 severity Facility Procedures CPT4 Code Description Modifier Quantity [id number] G0277-(Facility Use Only) HBOT full body chamber, 34min , 4 Physician Procedures Quantity CPT4 Code Description Modifier [id number] - WC PHYS HYPERBARIC OXYGEN THERAPY 1 ICD-10 Diagnosis Description M86.172 Other acute osteomyelitis, left ankle and foot E11.621 Type 2 diabetes mellitus with foot ulcer Electronic Signature(s) Signed: 10/08/2022 12:44:03 PM By: Fredirick Maudlin MD FACS Signed: 10/08/2022 4:19:15 PM By: [name] RN, [name] Entered By: [name] on 10/08/2022 10:34:14

## 2022-10-08 NOTE — Progress Notes (Signed)
KODEY, SOARES A ([id number]) [id number] Page 1 of 2 Visit Report for 10/08/2022 Arrival Information Details Patient Name: Date of Service: [name], [name] 10/08/2022 8:00 A M Medical Record Number: [id number] Patient Account Number: [id number] Date of Birth/Sex: Treating RN: [birth year] (67 y.o. [name] Primary Care Fahima Cifelli: [name] Other Clinician: Referring Grethel Zenk: Treating Felissa Blouch/Extender: Romie Jumper, [name] in Treatment: 25 Visit Information History Since Last Visit Added or deleted any medications: No Patient Arrived: Wheel Chair Any new allergies or adverse reactions: No Arrival Time: 07:35 Had a fall or experienced change in No Accompanied By: self activities of daily living that may affect Transfer Assistance: None risk of falls: Patient Identification Verified: Yes Signs or symptoms of abuse/neglect since last visito No Secondary Verification Process Completed: Yes Hospitalized since last visit: No Patient Requires Transmission-Based Precautions: No Implantable device outside of the clinic excluding No Patient Has Alerts: Yes cellular tissue based products placed in the center since last visit: Has Dressing in Place as Prescribed: Yes Pain Present Now: No Electronic Signature(s) Signed: 10/08/2022 4:19:15 PM By: [name] RN, [name] Entered By: [name] on 10/08/2022 08:29:47 -------------------------------------------------------------------------------- Encounter Discharge Information Details Patient Name: Date of Service: [name], [name] 10/08/2022 8:00 A M Medical Record Number: [id number] Patient Account Number: [id number] Date of Birth/Sex: Treating RN: 05/01/1956 (67 y.o. [name] Primary Care Donevin Sainsbury: [name] Other Clinician: Referring Jarrett Chicoine: Treating Crystalee Ventress/Extender: Delton See in Treatment: 25 Encounter Discharge Information  Items Discharge Condition: Stable Ambulatory Status: Wheelchair Discharge Destination: Other (Note Required) Telephoned: No Orders Sent: No Transportation: Private Auto Accompanied By: self Schedule Follow-up Appointment: Yes Clinical Summary of Care: Notes Patient transferred to treatment room for [facility] appt. Electronic Signature(s) Signed: 10/08/2022 4:19:15 PM By: [name] RN, [name] Entered By: [name] on 10/08/2022 10:35:09 -------------------------------------------------------------------------------- Vitals Details Patient Name: Date of Service: [name], [name] 10/08/2022 8:00 A M Medical Record Number: [id number] Patient Account Number: [id number] Date of Birth/Sex: Treating RN: Nov 23, 1955 (67 y.o. [name] Primary Care Jamese Trauger: [name] Other Clinician: Referring Monnica Saltsman: Treating Ariyah Sedlack/Extender: Romie Jumper, [name] in Treatment: [address] A ([id number]) [id number] Page 2 of 2 Vital Signs Time Taken: 07:35 Temperature (F): 97.9 Height (in): 72 Pulse (bpm): 85 Weight (lbs): 220 Respiratory Rate (breaths/min): 18 Body Mass Index (BMI): 29.8 Blood Pressure (mmHg): 137/89 Capillary Blood Glucose (mg/dl): 234 Reference Range: 80 - 120 mg / dl Electronic Signature(s) Signed: 10/08/2022 4:19:15 PM By: [name] RN, [name] Entered By: [name] on 10/08/2022 08:30:18

## 2022-10-09 ENCOUNTER — Encounter (HOSPITAL_BASED_OUTPATIENT_CLINIC_OR_DEPARTMENT_OTHER): Payer: Medicare HMO | Admitting: General Surgery

## 2022-10-09 DIAGNOSIS — E11621 Type 2 diabetes mellitus with foot ulcer: Secondary | ICD-10-CM | POA: Diagnosis not present

## 2022-10-09 LAB — GLUCOSE, CAPILLARY
Glucose-Capillary: 237 mg/dL — ABNORMAL HIGH (ref 70–99)
Glucose-Capillary: 142 mg/dL — ABNORMAL HIGH (ref 70–99)

## 2022-10-09 NOTE — Progress Notes (Signed)
[name], VANEPPS A ([id number]) [id number] Page 1 of 1 Visit Report for 10/09/2022 SuperBill Details Patient Name: Date of Service: [name], [place] [name] 10/09/2022 Medical Record Number: [id number] Patient Account Number: [id number] Date of Birth/Sex: Treating RN: 1955/08/19 (67 y.o. [name], [name] Primary Care Provider: [name] Other Clinician: Referring Provider: Treating Provider/Extender: Otho Perl, Billie Lade in Treatment: 25 Diagnosis Coding ICD-10 Codes Code Description [phone] Non-pressure chronic ulcer of other part of left foot with fat layer exposed M86.172 Other acute osteomyelitis, left ankle and foot L97.528 Non-pressure chronic ulcer of other part of left foot with other specified severity E11.621 Type 2 diabetes mellitus with foot ulcer E11.42 Type 2 diabetes mellitus with diabetic polyneuropathy S91.301A Unspecified open wound, right foot, initial encounter 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 severity Facility Procedures CPT4 Code Description Modifier Quantity [id number] G0277-(Facility Use Only) HBOT full body chamber, , 4 Physician Procedures Quantity CPT4 Code Description Modifier [id number] 99183 - WC PHYS HYPERBARIC OXYGEN THERAPY 1 ICD-10 Diagnosis Description M86.172 Other acute osteomyelitis, left ankle and foot E11.621 Type 2 diabetes mellitus with foot ulcer Electronic Signature(s) Signed: 10/09/2022 12:19:37 PM By: Duanne Guess MD FACS Signed: 10/09/2022 1:00:20 PM By: [name], [name] Entered By: Shawn Stall on 10/09/2022 10:24:56

## 2022-10-09 NOTE — Progress Notes (Signed)
[name], [name] ([id number]) [id number] Page 1 of 2 Visit Report for 10/09/2022 HBO Details Patient Name: Date of Service: [name], [name] 10/09/2022 8:00 [name] Medical Record Number: [id number] Patient Account Number: [id number] Date of Birth/Sex: Treating RN: 02/17/1956 (67 y.o. [name]) Deaton, Yvonne KendallBobbi Primary Care Jasai Sorg: Tama HeadingsBates, Darren Other Clinician: Referring Lance Huaracha: Treating Delmo Matty/Extender: Otho Perlannon, Jennifer Bates, Billie Ladearren Weeks in Treatment: 25 HBO Treatment Course Details Treatment Course Number: 1 Ordering Onie Kasparek: Geralyn CorwinHoffman, Jessica T Treatments Ordered: otal 80 HBO Treatment Start Date: 06/01/2022 HBO Indication: Diabetic Ulcer(s) of the Lower Extremity Standard/Conservative Wound Care tried and failed greater than or equal to 30 days HBO Treatment Details Treatment Number: 80 Patient Type: Outpatient Chamber Type: Monoplace Chamber Serial #: [id number] Treatment Protocol: 2.5 ATA with 90 minutes oxygen, with two 5 minute air breaks Treatment Details Compression Rate Down: 2.0 psi / minute De-Compression Rate Up: 2.0 psi / minute [name] breaks and breathing ir Compress Tx Pressure periods Decompress Decompress Begins Reached (leave unused spaces Begins Ends blank) Chamber Pressure (ATA 1 2.5 2.5 2.5 2.5 2.5 - - 2.5 1 ) Clock Time (24 hr) 08:00 08:14 08:44 08:49 09:19 09:24 - - 09:54 10:05 Treatment Length: 125 (minutes) Treatment Segments: 4 Vital Signs Capillary Blood Glucose Reference Range: 80 - 120 mg / dl HBO Diabetic Blood Glucose Intervention Range: <131 mg/dl or >782>249 mg/dl Time Vitals Blood Respiratory Capillary Blood Glucose Pulse Action Type: Pulse: Temperature: Taken: Pressure: Rate: Glucose (mg/dl): Meter #: Oximetry (%) Taken: Pre 07:54 164/77 83 20 97.9 237 Post 10:07 139/72 70 16 97 142 Treatment Response Treatment Toleration: Well Treatment Completion Status: Treatment Completed without Adverse Event Additional Procedure  Documentation Tissue Sevierity: Necrosis of bone Physician HBO Attestation: I certify that I supervised this HBO treatment in accordance with Medicare guidelines. [name] trained emergency response team is readily available per Yes hospital policies and procedures. Continue HBOT as ordered. Yes Electronic Signature(s) Signed: 10/09/2022 12:20:03 PM By: Duanne Guessannon, Jennifer MD FACS Entered By: Duanne Guessannon, Jennifer on 10/09/2022 12:20:03 -------------------------------------------------------------------------------- HBO Safety Checklist Details Patient Name: Date of Service: [name], [name]. 10/09/2022 8:00 [name] Medical Record Number: [id number] Patient Account Number: [id number] Date of Birth/Sex: Treating RN: 02/13/1956 (67 y.o. [name]) Deaton, Bobbi Primary Care Leomia Blake: Tama HeadingsBates, Darren Other Clinician: Referring Lanaya Bennis: Treating Piper Albro/Extender: Hannah BeatCannon, Jennifer Bates, Darren Rodocker, Shep [name] ([id number]) [id number] Page 2 of 2 Weeks in Treatment: 25 HBO Safety Checklist Items Safety Checklist Consent Form Signed Patient voided / foley secured and emptied 0700-turkey sausage, egg, cheese biscuit and When did you last eato OJ Last dose of injectable or oral agent 0625 Ostomy pouch emptied and vented if applicable [name] All implantable devices assessed, documented and approved [name] Intravenous access site secured and place [name] Valuables secured Linens and cotton and cotton/polyester blend (less than 51% polyester) Personal oil-based products / skin lotions / body lotions removed [name] Wigs or hairpieces removed [name] Smoking or tobacco materials removed [name] Books / newspapers / magazines / loose paper removed [name] Cologne, aftershave, perfume and deodorant removed [name] Jewelry removed (may wrap wedding band) Make-up removed [name] Hair care products removed [name] Battery operated devices (external) removed [name] Heating patches and chemical warmers removed [name] Titanium eyewear  removed [name] Nail polish cured greater than 10 hours [name] Casting material cured greater than 10 hours [name] Hearing aids removed [name] Loose dentures or partials removed [name] Prosthetics have been removed [name] Patient demonstrates correct use of air break device (if applicable) Patient concerns have been  addressed Patient grounding bracelet on and cord attached to chamber Specifics for Inpatients (complete in addition to above) Medication sheet sent with patient Intravenous medications needed or due during therapy sent with patient Drainage tubes (e.g. nasogastric tube or chest tube secured and vented) Endotracheal or Tracheotomy tube secured Cuff deflated of air and inflated with saline Airway suctioned Electronic Signature(s) Signed: 10/09/2022 1:00:20 PM By: [name], [name] Entered By: Shawn Stall on 10/09/2022 08:56:09

## 2022-10-09 NOTE — Progress Notes (Signed)
[name], [name] ([id number]) [id number] Page 1 of 2 Visit Report for 10/09/2022 Arrival Information Details Patient Name: Date of Service: [name], [place] [name] 10/09/2022 8:00 [name] Medical Record Number: [id number] Patient Account Number: [id number] Date of Birth/Sex: Treating RN: 09-30-1955 (67 y.o. [name] Primary Care Jarian Longoria: [name] Other Clinician: Referring Trevar Boehringer: Treating Courvoisier Hamblen/Extender: Otho Perl, Billie Lade in Treatment: 25 Visit Information History Since Last Visit Added or deleted any medications: No Patient Arrived: Wheel Chair Any new allergies or adverse reactions: No Arrival Time: 07:54 Had [name] fall or experienced change in No Accompanied By: self activities of daily living that may affect Transfer Assistance: Manual risk of falls: Patient Identification Verified: Yes Signs or symptoms of abuse/neglect since last visito No Secondary Verification Process Completed: Yes Hospitalized since last visit: No Patient Requires Transmission-Based Precautions: No Implantable device outside of the clinic excluding No Patient Has Alerts: Yes cellular tissue based products placed in the center since last visit: Has Dressing in Place as Prescribed: Yes Pain Present Now: No Electronic Signature(s) Signed: 10/09/2022 1:00:20 PM By: [name], [name] Entered By: Shawn Stall on 10/09/2022 08:55:14 -------------------------------------------------------------------------------- Encounter Discharge Information Details Patient Name: Date of Service: [name], [name] 10/09/2022 8:00 [name] Medical Record Number: [id number] Patient Account Number: [id number] Date of Birth/Sex: Treating RN: September 24, 1955 (67 y.o. [name] Primary Care [name]: [name] Other Clinician: Referring [name]: Treating Clell Trahan/Extender: [name] in Treatment: 25 Encounter Discharge Information  Items Discharge Condition: Stable Ambulatory Status: Wheelchair Discharge Destination: Home Transportation: Private Auto Accompanied By: self Schedule Follow-up Appointment: Yes Clinical Summary of Care: Electronic Signature(s) Signed: 10/09/2022 1:00:20 PM By: [name], [name] Entered By: Shawn Stall on 10/09/2022 10:25:15 -------------------------------------------------------------------------------- Vitals Details Patient Name: Date of Service: [name], [name] 10/09/2022 8:00 [name] Medical Record Number: [id number] Patient Account Number: [id number] Date of Birth/Sex: Treating RN: [birth year] (67 y.o. [name] Primary Care Karem Farha: [name] Other Clinician: Referring Makaleigh Reinard: Treating Waldemar Siegel/Extender: Otho Perl, [name] in Treatment: 25 Vital Signs Time Taken: 07:54 Temperature (F): 97.9 [name], [name] [name] ([id number]) [phone].pdf Page 2 of 2 Height (in): 72 Pulse (bpm): 83 Weight (lbs): 220 Respiratory Rate (breaths/min): 20 Body Mass Index (BMI): 29.8 Blood Pressure (mmHg): 164/77 Capillary Blood Glucose (mg/dl): 353 Reference Range: 80 - 120 mg / dl Electronic Signature(s) Signed: 10/09/2022 1:00:20 PM By: [name], [name] Entered By: Shawn Stall on 10/09/2022 08:55:38

## 2022-10-14 ENCOUNTER — Encounter (HOSPITAL_BASED_OUTPATIENT_CLINIC_OR_DEPARTMENT_OTHER): Payer: Medicare HMO | Admitting: Physician Assistant

## 2022-10-14 DIAGNOSIS — E11621 Type 2 diabetes mellitus with foot ulcer: Secondary | ICD-10-CM | POA: Diagnosis not present

## 2022-10-14 LAB — GLUCOSE, CAPILLARY
Glucose-Capillary: 287 mg/dL — ABNORMAL HIGH (ref 70–99)
Glucose-Capillary: 164 mg/dL — ABNORMAL HIGH (ref 70–99)

## 2022-10-14 NOTE — Progress Notes (Signed)
ITAY, AVELLINO [name] ([id number]) [id number] Page 1 of 2 Visit Report for 10/14/2022 Arrival Information Details Patient Name: Date of Service: [name], [name] 10/14/2022 8:00 [name] Medical Record Number: [id number] Patient Account Number: [id number] Date of Birth/Sex: Treating RN: Sep 06, 1955 (67 y.o. [name] Primary Care [name]: [name] Other Clinician: Karl [name] Referring [name]: Treating [name]: 26 Visit Information History Since Last Visit All ordered tests and consults were completed: Yes Patient Arrived: Wheel Chair Added or deleted any medications: No Arrival Time: 07:30 Any new allergies or adverse reactions: No Accompanied By: None Had [name] fall or experienced change in No Transfer Assistance: EasyPivot Patient Lift activities of daily living that may affect Patient Identification Verified: Yes risk of falls: Secondary Verification Process Completed: Yes Signs or symptoms of abuse/neglect since last visito No Patient Requires Transmission-Based Precautions: No Hospitalized since last visit: No Patient Has Alerts: Yes Implantable device outside of the clinic excluding No cellular tissue based products placed in the center since last visit: Pain Present Now: No Electronic Signature(s) Signed: 10/14/2022 1:43:45 PM By: [name] EMT Entered By: [name] on 10/14/2022 13:43:44 -------------------------------------------------------------------------------- Encounter Discharge Information Details Patient Name: Date of Service: [name], [name] 10/14/2022 8:00 [name] Medical Record Number: [id number] Patient Account Number: [id number] Date of Birth/Sex: Treating RN: 11-11-1955 (67 y.o. [name] Primary Care [name]: [name] Other Clinician: Karl [name] Referring [name]: Treating [name]/Extender: [name],  [name] in [name]: 26 Encounter Discharge Information Items Discharge Condition: Stable Ambulatory Status: Wheelchair Discharge Destination: Home Transportation: Private Auto Accompanied By: None Schedule Follow-up Appointment: Yes Clinical Summary of Care: Electronic Signature(s) Signed: 10/14/2022 1:50:25 PM By: [name] EMT Entered By: [name] on 10/14/2022 13:50:25 -------------------------------------------------------------------------------- Vitals Details Patient Name: Date of Service: [name], [name] 10/14/2022 8:00 [name] Medical Record Number: [id number] Patient Account Number: [id number] Date of Birth/Sex: Treating RN: Jan 06, 1956 (67 y.o. [name] Primary Care [name]: [name] Other Clinician: Karl [name] Referring [name]: Treating [name]/Extender: [name], [name] in [name]: 26 Vital Signs Time Taken: 08:01 Temperature (F): 97.4 [name] ([id number]) [id number] Page 2 of 2 Height (in): 72 Pulse (bpm): 87 Weight (lbs): 220 Respiratory Rate (breaths/min): 18 Body Mass Index (BMI): 29.8 Blood Pressure (mmHg): 126/88 Capillary Blood Glucose (mg/dl): 606 Reference Range: 80 - 120 mg / dl Electronic Signature(s) Signed: 10/14/2022 1:44:28 PM By: [name] EMT Entered By: [name] on 10/14/2022 13:44:28

## 2022-10-14 NOTE — Progress Notes (Signed)
[name], MAZZUCCO [name] ([id number]) [id number] Page 1 of 2 Visit Report for 10/14/2022 HBO Details Patient Name: Date of Service: [name], [name] 10/14/2022 8:00 [name] Medical Record Number: [id number] Patient Account Number: [id number] Date of Birth/Sex: Treating RN: Sep 25, 1955 (67 y.o. [name] Primary Care Tandra Rosado: [name] Other Clinician: [name] Referring Darcel Frane: Treating Irfan Veal/Extender: [name], [name] in Treatment: 26 HBO Treatment Course Details Treatment Course Number: 1 Ordering Atara Paterson: Geralyn Corwin T Treatments Ordered: otal 100 HBO Treatment Start Date: 06/01/2022 HBO Indication: Diabetic Ulcer(s) of the Lower Extremity Standard/Conservative Wound Care tried and failed greater than or equal to 30 days HBO Treatment Details Treatment Number: 81 Patient Type: Outpatient Chamber Type: Monoplace Chamber Serial #: [id number] Treatment Protocol: 2.5 ATA with 90 minutes oxygen, with two 5 minute air breaks Treatment Details Compression Rate Down: 2.0 psi / minute De-Compression Rate Up: 2.0 psi / minute [name] breaks and breathing ir Compress Tx Pressure periods Decompress Decompress Begins Reached (leave unused spaces Begins Ends blank) Chamber Pressure (ATA 1 2.5 2.5 2.5 2.5 2.5 - - 2.5 1 ) Clock Time (24 hr) 08:09 08:21 08:51 08:56 09:26 09:31 - - 10:01 10:12 Treatment Length: 123 (minutes) Treatment Segments: 4 Vital Signs Capillary Blood Glucose Reference Range: 80 - 120 mg / dl HBO Diabetic Blood Glucose Intervention Range: <131 mg/dl or >893 mg/dl Time Vitals Blood Respiratory Capillary Blood Glucose Pulse Action Type: Pulse: Temperature: Taken: Pressure: Rate: Glucose (mg/dl): Meter #: Oximetry (%) Taken: Pre 08:01 126/88 87 18 97.4 187 Post 10:17 150/97 70 20 97.3 164 Treatment Response Treatment Toleration: Well Treatment Completion Status: Treatment Completed without Adverse  Event Additional Procedure Documentation Tissue Sevierity: Necrosis of bone Electronic Signature(s) Signed: 10/14/2022 1:48:26 PM By: [name] EMT Signed: 10/14/2022 5:51:12 PM By: [name] Entered By: [name] on 10/14/2022 13:48:25 -------------------------------------------------------------------------------- HBO Safety Checklist Details Patient Name: Date of Service: [name], [name] 10/14/2022 8:00 [name] Medical Record Number: [id number] Patient Account Number: [id number] Date of Birth/Sex: Treating RN: [birth year] (67 y.o. [name] Primary Care Quintara Bost: [name] Other Clinician: [name] Referring Viggo Perko: Treating Saumya Hukill/Extender: [name], [name] in Treatment: 26 HBO Safety Checklist Items Safety Checklist [name], EWERT [name] ([id number]) [id number] Page 2 of 2 Consent Form Signed Patient voided / foley secured and emptied When did you last eato 0715 Last dose of injectable or oral agent 0630 Ostomy pouch emptied and vented if applicable NA All implantable devices assessed, documented and approved NA Intravenous access site secured and place NA Valuables secured Linens and cotton and cotton/polyester blend (less than 51% polyester) Personal oil-based products / skin lotions / body lotions removed Wigs or hairpieces removed NA Smoking or tobacco materials removed Books / newspapers / magazines / loose paper removed Cologne, aftershave, perfume and deodorant removed Jewelry removed (may wrap wedding band) Make-up removed NA Hair care products removed Battery operated devices (external) removed Heating patches and chemical warmers removed Titanium eyewear removed NA Nail polish cured greater than 10 hours NA Casting material cured greater than 10 hours NA Hearing aids removed NA Loose dentures or partials removed removed by patient Prosthetics have been removed NA Patient  demonstrates correct use of air break device (if applicable) Patient concerns have been addressed Patient grounding bracelet on and cord attached to chamber Specifics for Inpatients (complete in addition to above) Medication sheet sent with patient NA Intravenous medications needed or due during therapy sent with  patient NA Drainage tubes (e.g. nasogastric tube or chest tube secured and vented) NA Endotracheal or Tracheotomy tube secured NA Cuff deflated of air and inflated with saline NA Airway suctioned NA Notes The safety checklist was done before the treatment was started. Electronic Signature(s) Signed: 10/14/2022 1:46:27 PM By: [name] EMT Entered By: [name] on 10/14/2022 13:46:26

## 2022-10-15 ENCOUNTER — Encounter (HOSPITAL_BASED_OUTPATIENT_CLINIC_OR_DEPARTMENT_OTHER): Payer: Medicare HMO | Admitting: Internal Medicine

## 2022-10-15 DIAGNOSIS — E11621 Type 2 diabetes mellitus with foot ulcer: Secondary | ICD-10-CM

## 2022-10-15 DIAGNOSIS — L97528 Non-pressure chronic ulcer of other part of left foot with other specified severity: Secondary | ICD-10-CM | POA: Diagnosis not present

## 2022-10-15 DIAGNOSIS — L97522 Non-pressure chronic ulcer of other part of left foot with fat layer exposed: Secondary | ICD-10-CM | POA: Diagnosis not present

## 2022-10-15 DIAGNOSIS — M86172 Other acute osteomyelitis, left ankle and foot: Secondary | ICD-10-CM | POA: Diagnosis not present

## 2022-10-15 DIAGNOSIS — L97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 severity: Secondary | ICD-10-CM

## 2022-10-15 LAB — GLUCOSE, CAPILLARY
Glucose-Capillary: 352 mg/dL — ABNORMAL HIGH (ref 70–99)
Glucose-Capillary: 206 mg/dL — ABNORMAL HIGH (ref 70–99)

## 2022-10-15 NOTE — Progress Notes (Addendum)
MARLO, BOSSERT A ([id number]) [id number] Page 1 of 2 Visit Report for 10/15/2022 HBO Details Patient Name: Date of Service: [name], [place] [name] 10/15/2022 8:00 A M Medical Record Number: [id number] Patient Account Number: [id number] Date of Birth/Sex: Treating RN: 09/10/1955 (67 y.o. Marlan Palau Primary Care Drue Harr: [name] Other Clinician: [name] Referring Jashay Roddy: Treating Tomika Eckles/Extender: Nelda Bucks in Treatment: 26 HBO Treatment Course Details Treatment Course Number: 1 Ordering Quintarius Ferns: Geralyn Corwin T Treatments Ordered: otal 100 HBO Treatment Start Date: 06/01/2022 HBO Indication: Diabetic Ulcer(s) of the Lower Extremity Standard/Conservative Wound Care tried and failed greater than or equal to 30 days HBO Treatment Details Treatment Number: 82 Patient Type: Outpatient Chamber Type: Monoplace Chamber Serial #: [id number] Treatment Protocol: 2.5 ATA with 90 minutes oxygen, with two 5 minute air breaks Treatment Details Compression Rate Down: 2.0 psi / minute De-Compression Rate Up: 2.0 psi / minute A breaks and breathing ir Compress Tx Pressure periods Decompress Decompress Begins Reached (leave unused spaces Begins Ends blank) Chamber Pressure (ATA 1 2.5 2.5 2.5 2.5 2.5 - - 2.5 1 ) Clock Time (24 hr) 08:09 08:22 08:52 08:57 09:27 09:32 - - 10:02 10:14 Treatment Length: 125 (minutes) Treatment Segments: 4 Vital Signs Capillary Blood Glucose Reference Range: 80 - 120 mg / dl HBO Diabetic Blood Glucose Intervention Range: <131 mg/dl or >628 mg/dl Time Vitals Blood Respiratory Capillary Blood Glucose Pulse Action Type: Pulse: Temperature: Taken: Pressure: Rate: Glucose (mg/dl): Meter #: Oximetry (%) Taken: Pre 07:53 158/83 84 18 98.3 352 Post 10:19 149/93 74 20 98.3 206 Treatment Response Treatment Toleration: Well Treatment Completion Status: Treatment Completed without Adverse  Event Additional Procedure Documentation Tissue Sevierity: Necrosis of muscle Physician HBO Attestation: I certify that I supervised this HBO treatment in accordance with Medicare guidelines. A trained emergency response team is readily available per Yes hospital policies and procedures. Continue HBOT as ordered. Yes Electronic Signature(s) Signed: 10/15/2022 3:29:47 PM By: Geralyn Corwin DO Previous Signature: 10/15/2022 1:44:49 PM Version By: [name] EMT Entered By: Geralyn Corwin on 10/15/2022 15:28:41 -------------------------------------------------------------------------------- HBO Safety Checklist Details Patient Name: Date of Service: [name], [name] 10/15/2022 8:00 A M Medical Record Number: [id number] Patient Account Number: [id number] Date of Birth/Sex: Treating RN: 1956-02-24 (67 y.o. Marlan Palau Primary Care [name] Milnes: [name] Other Clinician: Deaunte, Rubinstein A ([id number]) [id number] Page 2 of 2 Referring Dustina Scoggin: Treating Kaylem Gidney/Extender: Mariam Dollar, [name] in Treatment: 26 HBO Safety Checklist Items Safety Checklist Consent Form Signed Patient voided / foley secured and emptied When did you last eato 0700 Last dose of injectable or oral agent 0630 Ostomy pouch emptied and vented if applicable NA All implantable devices assessed, documented and approved NA Intravenous access site secured and place NA Valuables secured Linens and cotton and cotton/polyester blend (less than 51% polyester) Personal oil-based products / skin lotions / body lotions removed Wigs or hairpieces removed NA Smoking or tobacco materials removed Books / newspapers / magazines / loose paper removed Cologne, aftershave, perfume and deodorant removed Jewelry removed (may wrap wedding band) Make-up removed NA Hair care products removed Battery operated devices (external) removed Heating patches  and chemical warmers removed Titanium eyewear removed NA Nail polish cured greater than 10 hours NA Casting material cured greater than 10 hours NA Hearing aids removed NA Loose dentures or partials removed removed by patient Prosthetics have been removed NA Patient demonstrates correct use of air break device (if applicable) Patient concerns  have been addressed Patient grounding bracelet on and cord attached to chamber Specifics for Inpatients (complete in addition to above) Medication sheet sent with patient NA Intravenous medications needed or due during therapy sent with patient NA Drainage tubes (e.g. nasogastric tube or chest tube secured and vented) NA Endotracheal or Tracheotomy tube secured NA Cuff deflated of air and inflated with saline NA Airway suctioned NA Notes The safety checklist was done before the treatment was started. Electronic Signature(s) Signed: 10/15/2022 1:43:41 PM By: [name] EMT Entered By: [name] on 10/15/2022 13:43:40

## 2022-10-15 NOTE — Progress Notes (Signed)
WILLMER, HILTON [name] ([id number]) [id number] Page 1 of 2 Visit Report for 10/15/2022 Problem List Details Patient Name: Date of Service: [name], [place] [name] 10/15/2022 8:00 [name] Medical Record Number: [id number] Patient Account Number: [id number] Date of Birth/Sex: Treating RN: 13-Oct-1955 (67 y.o. 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/14/2022 No Yes exposed M86.172 Other acute osteomyelitis, left ankle and foot 05/08/2022 No Yes L97.528 Non-pressure chronic ulcer of other part of left foot with other 04/14/2022 No Yes specified severity E11.621 Type 2 diabetes mellitus with foot ulcer 04/14/2022 No Yes E11.42 Type 2 diabetes mellitus with diabetic polyneuropathy 04/14/2022 No Yes S91.301A Unspecified open wound, right foot, initial encounter 07/16/2022 No Yes 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 10/15/2022 Medical Record Number: [id number] Patient Account Number: [id number] Date of Birth/Sex: Treating RN: 1955/11/01 (67 y.o. 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 specified severity E11.621 Type 2 diabetes mellitus with foot ulcer E11.42 Type 2 diabetes mellitus with diabetic polyneuropathy S91.301A Unspecified open wound, right foot, initial encounter 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 severity Facility Procedures : CPT4 Code Description: [id number]-(Facility Use Only) HBOT full body chamber, , ICD-10 Diagnosis Description E11.621 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.522 Non-pressure chronic ulcer of other part of left foot with fat lay L97.528  Non-pressure chronic ulcer of other part of left foot with other s M86.172 Other acute osteomyelitis, left ankle and foot Modifier: er exposed pecified severity Quantity: 4 Physician Procedures : CPT4 Code Description Modifier [id number] [phone] - WC PHYS HYPERBARIC OXYGEN THERAPY ICD-10 Diagnosis Description E11.621 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.528  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

## 2022-10-15 NOTE — Progress Notes (Addendum)
DEKOTA, WASLEY [name] ([id number]) [id number] Page 1 of 14 Visit Report for 10/15/2022 Chief Complaint Document Details Patient Name: Date of Service: [name], [name] [name] [name]. 10/15/2022 10:30 [name] Medical Record Number: [id number] Patient Account Number: [id number] Date of Birth/Sex: Treating RN: 1955-07-14 (67 y.o. M) Primary Care Provider: Tama [name] Other Clinician: Referring Provider: Treating Provider/Extender: [name], [name] in Treatment: 26 Information Obtained from: Patient Chief Complaint 10/16/20; patient is here with [name] substantial wound on his right foot in the setting of [name] recent transmetatarsal amputation. 04/14/2022; referral from podiatry for postsurgical wound of nonhealing partial left fifth ray amputation, right foot (07/16/22): TMA dehiscence Electronic Signature(s) Signed: 10/15/2022 11:16:11 AM By: [name] Corwin DO Entered By: [name] on 10/15/2022 11:11:50 -------------------------------------------------------------------------------- Cellular or Tissue Based Product Details Patient Name: Date of Service: [name], [name] [name]. 10/15/2022 10:30 [name] Medical Record Number: [id number] Patient Account Number: [id number] Date of Birth/Sex: Treating RN: 1956-03-07 (67 y.o. [name] Primary Care Provider: Tama [name] Other Clinician: Referring Provider: Treating Provider/Extender: [name] in Treatment: 26 Cellular or Tissue Based Product Type Wound #5 Left,Lateral Foot Applied to: Performed By: Physician [name] Corwin, DO Cellular or Tissue Based Product Type: Grafix prime Level of Consciousness (Pre-procedure): Awake and Alert Pre-procedure Verification/Time Out Yes - 10:58 Taken: Location: genitalia / hands / feet / multiple digits Wound Size (sq cm): 0.15 Product Size (sq cm): 1 Waste Size (sq cm): 0 Waste Reason: wound size Amount of Product Applied (sq cm):  1 Instrument Used: Forceps Lot #: [id number] Expiration Date: 05/23/2023 Fenestrated: No Reconstituted: Yes Solution Type: normal saline Solution Amount: 3ml Lot #: [id number] Solution Expiration Date: 12/03/2024 Procedural Pain: 0 Post Procedural Pain: 0 Response to Treatment: Procedure was tolerated well Level of Consciousness (Post- Awake and Alert procedure): Post Procedure Diagnosis Same as Pre-procedure Notes Scribed for Dr [name] by [name] Pulling, RN Electronic Signature(s) Signed: 10/26/2022 9:25:08 AM By: [name] Corwin DO Signed: 11/11/2022 4:57:17 PM By: [name] Stall RN, [name] Previous Signature: 10/15/2022 11:16:11 AM Version By: Shanna Cisco [name] ([id number]) [id number] Page 2 of 14 Previous Signature: 10/15/2022 11:16:11 AM Version By: [name] Corwin DO Previous Signature: 10/15/2022 4:44:09 PM Version By: [name] Pulling RN, [name] Entered By: [name] on 10/21/2022 13:51:18 -------------------------------------------------------------------------------- Cellular or Tissue Based Product Details Patient Name: Date of Service: [name], [name] [name]. 10/15/2022 10:30 [name] Medical Record Number: [id number] Patient Account Number: [id number] Date of Birth/Sex: Treating RN: 30-Nov-1955 (67 y.o. [name] Primary Care Provider: Tama [name] Other Clinician: Referring Provider: Treating Provider/Extender: [name] in Treatment: 26 Cellular or Tissue Based Product Type Wound #6 Right Amputation Site - Transmetatarsal Applied to: Performed By: Physician [name] Corwin, DO Cellular or Tissue Based Product Type: Grafix prime Level of Consciousness (Pre-procedure): Awake and Alert Pre-procedure Verification/Time Out Yes - 10:58 Taken: Location: genitalia / hands / feet / multiple digits Wound Size (sq cm): 0.04 Product Size (sq cm): 1 Waste Size (sq cm): 1 Waste Reason: wound size Amount  of Product Applied (sq cm): 0 Instrument Used: Forceps Lot #: [id number] Expiration Date: 05/23/2023 Fenestrated: No Reconstituted: Yes Solution Type: normal saline Solution Amount: 3ml Lot #: [id number] Solution Expiration Date: 12/03/2024 Procedural Pain: 0 Post Procedural Pain: 0 Response to Treatment: Procedure was tolerated well Level of Consciousness (Post- Awake and Alert procedure): Post Procedure Diagnosis Same as Pre-procedure Notes Scribed for Dr [name] by [name] Pulling, RN Electronic  Signature(s) Signed: 10/26/2022 9:25:08 AM By: [name] Corwin DO Signed: 11/11/2022 4:57:17 PM By: [name] Stall RN, [name] Previous Signature: 10/15/2022 11:16:11 AM Version By: [name] Corwin DO Previous Signature: 10/15/2022 4:44:09 PM Version By: [name] Pulling RN, [name] Entered By: [name] on 10/21/2022 13:52:30 -------------------------------------------------------------------------------- Debridement Details Patient Name: Date of Service: [name], [name] [name]. 10/15/2022 10:30 [name] Medical Record Number: [id number] Patient Account Number: [id number] Date of Birth/Sex: Treating RN: [birth year] (67 y.o. [name] Primary Care Provider: Tama [name] Other Clinician: Referring Provider: Treating Provider/Extender: [name] in Treatment: 26 Debridement Performed for Assessment: Wound #6 Right Amputation Site - Transmetatarsal Performed By: Physician [name] Corwin, DO Debridement Type: Debridement Severity of Tissue Pre Debridement: Fat layer exposed Level of Consciousness (Pre-procedure): Awake and Alert Pre-procedure Verification/Time Out Yes - 10:58 Taken: Start Time: 11:01 Pain Control: Lidocaine 5% topical ointment T Area Debrided (L x W): otal 0.5 (cm) x 0.5 (cm) = 0.25 (cm) [name], [name] ([id number]) [id number] Page 3 of 14 Tissue and other material debrided: Non-Viable, Callus, Slough, Subcutaneous,  Slough Level: Skin/Subcutaneous Tissue Debridement Description: Excisional Instrument: Curette Bleeding: Minimum Hemostasis Achieved: Pressure Response to Treatment: Procedure was tolerated well Level of Consciousness (Post- Awake and Alert procedure): Post Debridement Measurements of Total Wound Length: (cm) 0.5 Width: (cm) 0.5 Depth: (cm) 0.5 Volume: (cm) 0.098 Character of Wound/Ulcer Post Debridement: Improved Severity of Tissue Post Debridement: Fat layer exposed Post Procedure Diagnosis Same as Pre-procedure Notes Scribed for Dr [name] by [name] Pulling, RN Electronic Signature(s) Signed: 10/15/2022 11:16:11 AM By: [name] Corwin DO Signed: 10/15/2022 4:44:09 PM By: [name] Pulling RN, [name] Entered By: [name] on 10/15/2022 11:03:44 -------------------------------------------------------------------------------- Debridement Details Patient Name: Date of Service: [name], [name] [name]. 10/15/2022 10:30 [name] Medical Record Number: [id number] Patient Account Number: [id number] Date of Birth/Sex: Treating RN: [birth year] (67 y.o. [name] Primary Care Provider: Tama [name] Other Clinician: Referring Provider: Treating Provider/Extender: [name] in Treatment: 26 Debridement Performed for Assessment: Wound #5 Left,Lateral Foot Performed By: Physician [name] Corwin, DO Debridement Type: Debridement Severity of Tissue Pre Debridement: Fat layer exposed Level of Consciousness (Pre-procedure): Awake and Alert Pre-procedure Verification/Time Out Yes - 10:58 Taken: Start Time: 11:01 Pain Control: Lidocaine 5% topical ointment T Area Debrided (L x W): otal 0.5 (cm) x 0.3 (cm) = 0.15 (cm) Tissue and other material debrided: Non-Viable, Callus, Slough, Slough Level: Non-Viable Tissue Debridement Description: Selective/Open Wound Instrument: Curette Bleeding: Minimum Hemostasis Achieved: Pressure Response to Treatment: Procedure  was tolerated well Level of Consciousness (Post- Awake and Alert procedure): Post Debridement Measurements of Total Wound Length: (cm) 0.5 Width: (cm) 0.3 Depth: (cm) 0.2 Volume: (cm) 0.024 Character of Wound/Ulcer Post Debridement: Improved Severity of Tissue Post Debridement: Fat layer exposed Post Procedure Diagnosis Same as Pre-procedure Notes Scribed for Dr [name] by [name] Pulling, RN [name] ([id number]) [phone] Page 4 of 14 Electronic Signature(s) Signed: 10/15/2022 11:16:11 AM By: [name] Corwin DO Signed: 10/15/2022 4:44:09 PM By: [name] Pulling RN, [name] Entered By: [name] on 10/15/2022 11:04:50 -------------------------------------------------------------------------------- HPI Details Patient Name: Date of Service: [name], [name] [name]. 10/15/2022 10:30 [name] Medical Record Number: [id number] Patient Account Number: [id number] Date of Birth/Sex: Treating RN: [birth year] (67 y.o. M) Primary Care Provider: Tama [name] Other Clinician: Referring Provider: Treating Provider/Extender: [name], [name] in Treatment: 26 History of Present Illness HPI Description: ADMISSION 10/16/20 This is [name] 67 year old man who is [name] type  II diabetic. Initially seen by Dr. Sherral Hammers of vein and vascular on 09/10/2021 for bilateral lower extremity rest pain right greater than left. His ABIs demonstrated monophasic waveforms at the ankles bilaterally with depressed toe pressures. His ABI in the right was 0.5 and on the left 0.28. There were no obtainable waveforms for TBI's. He underwent angiography on 09/10/2021 and had findings of severe PAD with 95% stenosis of the distal SFA. He also had peroneal and posterior tibial arteries occluded with significant collateralization in the calf there was reconstitution of the posterior tibial artery in the distal third of the calf. Ostia of the anterior tibial artery was not appreciated. He  underwent [name] angioplasty of the superficial femoral artery. He required admission to hospital from 09/12/2021 through 09/22/2021 with right foot cellulitis and sepsis. On 3/13 he underwent [name] right below-knee popliteal to posterior tibial bypass using [name] greater saphenous vein. Ultimately however he required [name] right TMA by podiatry which I believe was done on 3/17 for progressive diabetic foot infection. He was discharged to [facility] skilled facility. He arrives in clinic today with [name] large wound area over the entirety of his amputation site extending proximally. The attempted flap closure of the amputation site and his TMA has failed and the wound extends under the attempt to closure. Still some sutures in place. There is an odor but he is not systemically unwell. He is due for follow-up arterial studies and has an appointment with vascular surgery on 10/28/2021. They are using wet-to-dry dressings at [facility]. Past medical history includes type 2 diabetes with peripheral neuropathy, hypertension, obstructive sleep apnea 4/20; patient presents for follow-up. He is being discharged from his facility at [facility] today. They have been doing Dakin's wet-to-dry dressings. He states that his fiance can help with dressing changes. He is getting [facility] home [facility] to come out 3 times [name] week once he leaves the facility. He is scheduled to see vein and vascular on 5/12. He has not seen the wound himself. He puts covers over his face for wound exams. He currently denies systemic signs of infection. 4/28; patient admitted to the clinic 2 weeks ago with [name] dehisced right TMA in the setting of type 2 diabetes with significant PAD. He is status post revascularization. He has been discharged from the nursing home he was at and now is at home using Dakin's wet-to-dry dressings daily he has home health. He denies any systemic signs of infection 5/4; patient presents for follow-up. His fiance and home health have been  changing his dressings. He currently denies systemic signs of infection. He continues to put sheet covers over his face during our wound encounters because he does not want [name] look at the wound. 5/25; Patient presents for follow-up. He missed his last clinic appointment. He had [name] bone biopsy and culture done at last clinic visit that showed acute osteomyelitis with growth of E. coli, stenotrophomonas maltophilia and enteroccocus faecalis. [name] referral to infectious disease was made. He has not heard from them yet. He has been using Dakin's wet-to-dry dressings. He is now more comfortable with looking at the wound bed. 6/6; the patient comes into clinic today with 2 wounds on the left foot which apparently have been there for several months. This was not known to assess but was known by Dr. Randie Heinz. In fact he underwent an angiogram yesterday. He had [name] laser atherectomy of the left posterior tibial artery with [name] 3 mm balloon angioplasty, also [name] stent of the last left SFA.  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 This is [name] lot better than the last time I saw it. He has been using Dakin's wet-to-dry. He has an appointment with infectious disease for [name] bone biopsy which showed E. coli, stenotrophomonas and Enterococcus faecalis. The appointment is for tomorrow 04/14/2022 Mr. Orvin Ratte ajuddin is an uncontrolled type 2 diabetic on insulin that presents with [name] left foot ulcer. On 12/18/2021 he required [name] left fifth ray amputation. He had revision of this site on 6/17 by Dr. Allena Katz, podiatry. He states he has had [name] wound VAC on this area for the past 3 weeks. It is unclear what the prior wound care has been. He has [name] history of osteomyelitis to this area and was treated with IV and oral antibiotics For 6 weeks by Dr. Luciana Axe of infectious disease. At his last  follow-up on 02/20/2022 his CRP was trending down and antibiotics were stopped. He also has [name] history of peripheral arterial disease status post stenting to the left SFA and balloon angioplasty to the left posterior tibial artery On 12/2021. He has follow-up with vein and vascular at the end of the month. He also has [name] wound to the dorsal aspect of the left foot that he has been placing Betadine on. 10/19; patient presents for follow-up. He had [name] bone biopsy of the left foot at last clinic visit showed osteomyelitis. The bone culture showed Enterobacter cloacae, Enterococcus faecalis, Staphylococcus aureus, viridans group strep, actinotigum schaelii. He has been referred to infectious disease And has an appointment on October 25. He has been using Dakin's wet-to-dry dressing to the lateral left foot wound and Hydrofera Blue and Medihoney to the dorsal foot wound. He has no issues or complaints today. He denies systemic signs of infection. 10/26; patient presents for follow-up. He saw Dr. Luciana Axe yesterday with infectious disease and has been prescribed doxycycline and Levaquin for his chronic osteomyelitis. He has been using Dakin's wet-to-dry dressings to the lateral left foot wound and Medihoney and Hydrofera Blue to the dorsal wound. We discussed [name] skin graft for the dorsal foot wound and he would like to proceed with insurance verification. 11/3; patient with [name] wound on the dorsal left foot and [name] substantial postsurgical area on the lateral left foot fifth ray amputation. He is also followed by Dr. Rosetta Posner of infectious disease. He had [name] bone biopsy from the left lateral foot that showed acute osteomyelitis. Swab culture showed multiple organisms. He has also been revascularized by infectious disease status post left SFA stenting and tibial laser atherectomy and angioplasty. An MRI of the left foot had previously shown osteomyelitis of the fifth met head we have been using Dakin's wet-to-dry to the left  lateral foot and Medihoney and Hydrofera Blue to the wound on the dorsal foot. 11/13; HBO treatment was discussed at last clinic visit and patient would like to proceed with this. He has orientation today. He has been approved for Grafix. We have been using Hydrofera Blue and Medihoney to the left dorsal foot and collagen to the left lateral foot. Patient has home health that comes in for [name], [name] ([id number]) [id number] Page 5 of 14 dressing changes. He currently has no issues or complaints today. 12/7; patient missed his last clinic appointment. He was last seen 3 weeks ago. He is  currently off of antibiotics per ID. He completed [name] prolonged treatment. He has been using collagen to the lateral foot wound. He has been using Medihoney and Hydrofera Blue to the dorsal foot wound. He has been doing well with hyperbaric oxygen therapy. He has no issues or complaints today. He denies signs of of infection. 12/14; patient presents for follow-up. We have been using Grafix to the wound beds. He has had trouble equalizing the pressure in his ears during HBO treatments. It was recommended he follow-up with ENT He does not have an appointment yet. He currently denies signs of infection. . 12/21; patient presents for follow-up. He was seen by ENT and had tubes placed. He is continued HBO without issues. We have been using Grafix to the wound beds. There is been improvement in wound healing. 12/21; patient presents for follow-up. He has no issues or complaints today. There is been improvement in wound healing. 1/5; patient presents for follow-up. At last clinic visit Grafix was placed in standard fashion. He has no issues or complaints today. He is tolerating hyperbaric oxygen therapy well. He denies signs of infection. 1/11; patient presents for follow-up. We have been placing Grafix to the wound beds on the left foot. The dorsal left foot wound has healed. The left  lateral foot wound has shown improvement in healing. Unfortunately has had dehiscence of his previous surgical wound site on the right foot. He currently denies signs of infection. 1/19; patient presents for follow-up. Grafix was placed to the left foot wound at last clinic visit. He has been using Dakin's wet-to-dry dressings to the right foot wound. He continues HBO therapy without issues. He has shown improvement in wound healing to the left foot. 1/26; patient presents for follow-up. We been placing Grafix to the left foot wound and patient has been using Dakin's wet-to-dry dressings to the right foot wound. There is been improvement in wound healing. He continues HBO therapy without issues. 2/2; patient presents for follow-up. We have been using Grafix to the left foot wound and Dakin's wet-to-dry dressings to the right foot wound. He has no issues or complaints today. 2/8; patient presents for follow-up. We have been using Grafix to the left foot wound and Dakin's wet-to-dry dressings to the right foot wound. He has no issues or complaints today. He continues HBO without issues. 2/15; patient presents for follow-up. We have been using Grafix to the wound beds. He continues HBO without issues. He has been offloading the wound beds with his surgical shoe. 2/22; patient presents for follow-up. We have been using Grafix to the wound beds. He has no issues or complaints today. He continues HBO therapy without issues. 2/29; patient presents for follow-up. We have been using Grafix to the wound beds. There is been improvement in wound healing. He has no issues or complaints today. He continues HBO therapy. 3/7; patient presents for follow-up. We have been using Grafix to the wound beds. Yesterday patient had an arteriogram to the right lower extremity with [name] drug-eluting stent placed in the superficial femoral artery. He has no issues to report on today. 3/14; patient presents for follow-up. We  have been using Grafix to the wound beds. He continues to do HBO therapy without issues. 3/21; patient presents for follow-up. We have been using Grafix to the wound bed. He continues to do HBO therapy. There continues to be improvement in wound healing. 3/29; patient presents for follow-up. We have been using Grafix to the wound beds. He continues  to do HBO therapy. He has no issues or complaints today. There is been improvement in wound healing. 10/08/2022: Both wounds continue to improve. There is some periwound callus accumulation on both sites. No concern for infection. 4/11; patient presents for follow-up. We have been using Grafix to the wound beds. He has no issues or complaints today. He continues with HBO therapy. There is been improvement in healing to the left foot. The right foot wound is stable. We discussed doing the total contact cast on the right foot and. Patient was agreeable to proceeding with this. We will start this at next clinic visit. Electronic Signature(s) Signed: 10/15/2022 11:16:11 AM By: [name] Corwin DO Entered By: [name] on 10/15/2022 11:12:36 -------------------------------------------------------------------------------- Physical Exam Details Patient Name: Date of Service: [name], [name] [name]. 10/15/2022 10:30 [name] Medical Record Number: [id number] Patient Account Number: [id number] Date of Birth/Sex: Treating RN: [birth year] (67 y.o. M) Primary Care Provider: Tama [name] Other Clinician: Referring Provider: Treating Provider/Extender: [name], [name] in Treatment: 26 Constitutional respirations regular, non-labored and within target range for patient.. Cardiovascular 2+ dorsalis pedis/posterior tibialis pulses. Psychiatric pleasant and cooperative. [name], [name] ([id number]) [id number] Page 6 of 14 Notes T the plantar aspect of the right foot there is an open wound with nonviable tissue,  granulation tissue and callus o T the lateral aspect of the left foot there is an open wound with granulation tissue slough and callus. No surrounding signs of infection to the wound bed. o Electronic Signature(s) Signed: 10/15/2022 11:16:11 AM By: [name] Corwin DO Entered By: [name] on 10/15/2022 11:13:46 -------------------------------------------------------------------------------- Physician Orders Details Patient Name: Date of Service: [name], [name] [name]. 10/15/2022 10:30 [name] Medical Record Number: [id number] Patient Account Number: [id number] Date of Birth/Sex: Treating RN: Apr 21, 1956 (67 y.o. [name] Primary Care Provider: Tama [name] Other Clinician: Referring Provider: Treating Provider/Extender: [name] in Treatment: 26 Verbal / Phone Orders: No Diagnosis Coding Follow-up Appointments ppointment in 1 week. - Dr. Mikey [name] Tuesday 10/20/2022 1115 after Hyperbarics. for cast Return [name] Other: - Dr [name] Thursday 10/22/22 @ 1030 after hyperbaric for cast change Anesthetic (In clinic) Topical Lidocaine 5% applied to wound bed Cellular or Tissue Based Products Cellular or Tissue Based Product Type: - Grafix # 3 applied 06/25/22 GRafix # 4 applied 07/02/22 Grafix # 5 applied 07/10/22 Grafix # 6 applied 07/17/22 Grafix # 7 applied 07/24/22---Also, go ahead and run IVR for more Grafix for left lateral foot Grafix #8 applied 07/31/2022 Grafix # 9 applied 08/07/22] left foot Grafix #10 applied 08/13/2022 left and right foot wounds today split the grafix in half. Graftx #11 applied to both wounds. Patient right foot approved for total of 12 between dates 08/18/2022-11/04/2022. Grafix #12 applied to both wounds. grafix #4 right foot split grafix and use to left foot #13. total can have 24. Grafix # 5 applied to right foot split grafix and use to left foot # 14. Grafix #6 right foot and Grafix #16 to left foot applied. Grafix #7 right  foot and Grafix #17 left foot applied. Grafix #8 right foot and Grafix #18 left foot applied Grafix #9 right foot and Grafix #19 left foot applied Cellular or Tissue Based Product applied to wound bed, secured with steri-strips, cover with Adaptic or Mepitel. (DO NOT REMOVE). Bathing/ Shower/ Hygiene May shower with protection but do not get wound dressing(s) wet. Protect dressing(s) with water repellant cover (for example, large plastic bag) or [name]  cast cover and may then take shower. Edema Control - Lymphedema / SCD / Other Avoid standing for long periods of time. Off-Loading Total Contact Cast to Right Lower Extremity - T be placed Tuesday 10/20/22 o Open toe surgical shoe to: - wear when standing or walking. Hyperbaric Oxygen Therapy Evaluate for HBO Therapy - 07/24/22- Pt. to finish next 8 HBO treatments 07/24/22- DR. Meta Kroenke wants pt. to be extended for 40 more treatments Indication: - Wagner Grade III to left lateral foot 2.5 ATA for 90 Minutes with 2 Five (5) Minute [name] Breaks ir Total Number of Treatments: - 40; extend for 40 more= 80 treatments One treatments per day (delivered Monday through Friday unless otherwise specified in Special Instructions below): Finger stick Blood Glucose Pre- and Post- HBOT Treatment. Follow Hyperbaric Oxygen Glycemia Protocol Afrin (Oxymetazoline HCL) 0.05% nasal spray - 1 spray in both nostrils daily as needed prior to HBO treatment for difficulty clearing ears Wound Treatment Wound #5 - Foot Wound Laterality: Left, Lateral Secondary Dressing: ABD Pad, 5x9 1 x Per Day/30 Days Discharge Instructions: Apply over primary dressing as directed. Secured With: [employer], 4.5x3.1 (in/yd) 1 x Per Day/30 Days Discharge Instructions: Secure with Kerlix as directed. Secured With: 50M Medipore H Soft Cloth Surgical T ape, 4 x 10 (in/yd) 1 x Per Day/30 Days Discharge Instructions: Secure with tape as directed. ALIKA, KRISKO [name] ([id number])  [id number] Page 7 of 14 Wound #6 - Amputation Site - Transmetatarsal Wound Laterality: Right Secondary Dressing: ABD Pad, 5x9 1 x Per Day/30 Days Discharge Instructions: Apply over primary dressing as directed. Secured With: [employer], 4.5x3.1 (in/yd) 1 x Per Day/30 Days Discharge Instructions: Secure with Kerlix as directed. Secured With: 50M Medipore H Soft Cloth Surgical T ape, 4 x 10 (in/yd) 1 x Per Day/30 Days Discharge Instructions: Secure with tape as directed. Patient Medications llergies: gemfibrozil, OxyContin, pork derived (porcine), simvastatin [name] Notifications Medication Indication Start End prior to debridement 10/15/2022 lidocaine DOSE topical 5 % ointment - ointment topical once daily GLYCEMIA INTERVENTIONS PROTOCOL PRE-HBO GLYCEMIA INTERVENTIONS ACTION INTERVENTION Obtain pre-HBO capillary blood glucose (ensure 1 physician order is in chart). [name]. Notify HBO physician and await physician orders. 2 If result is 70 mg/dl or below: B. If the result meets the hospital definition of [name] critical result, follow hospital policy. [name]. Give patient an 8 ounce Glucerna Shake, an 8 ounce Ensure, or 8 ounces of [name] Glucerna/Ensure equivalent dietary supplement*. B. Wait 30 minutes. If result is 71 mg/dl to 409 mg/dl: C. Retest patients capillary blood glucose (CBG). D. If result greater than or equal to 110 mg/dl, proceed with HBO. If result less than 110 mg/dl, notify HBO physician and consider holding HBO. If result is 131 mg/dl to 811 mg/dl: [name]. Proceed with HBO. [name]. Notify HBO physician and await physician orders. B. It is recommended to hold HBO and do If result is 250 mg/dl or greater: blood/urine ketone testing. C. If the result meets the hospital definition of [name] critical result, follow hospital policy. POST-HBO GLYCEMIA INTERVENTIONS ACTION INTERVENTION Obtain post HBO capillary blood glucose (ensure 1 physician order is in  chart). [name]. Notify HBO physician and await physician orders. 2 If result is 70 mg/dl or below: B. If the result meets the hospital definition of [name] critical result, follow hospital policy. [name]. Give patient an 8 ounce Glucerna Shake, an 8 ounce Ensure, or 8 ounces of [name] Glucerna/Ensure equivalent dietary supplement*. B. Wait 15 minutes for symptoms of If result is  71 mg/dl to 784 mg/dl: hypoglycemia (i.e. nervousness, anxiety, sweating, chills, clamminess, irritability, confusion, tachycardia or dizziness). C. If patient asymptomatic, discharge patient. If patient symptomatic, repeat capillary blood glucose (CBG) and notify HBO physician. If result is 101 mg/dl to 696 mg/dl: [name]. Discharge patient. [name]. Notify HBO physician and await physician orders. B. It is recommended to do blood/urine ketone If result is 250 mg/dl or greater: testing. C. If the result meets the hospital definition of [name] critical result, follow hospital policy. *Juice or candies are NOT equivalent products. If patient refuses the Glucerna or Ensure, please consult the hospital dietitian for an appropriate substitute. Electronic Signature(s) Signed: 10/15/2022 11:55:32 AM By: [name] Corwin DO Signed: 10/15/2022 4:44:09 PM By: [name] Pulling RN, [name] Previous Signature: 10/15/2022 11:16:11 AM Version By: [name] Corwin DO Entered By: [name] on 10/15/2022 11:50:44 [name] ([id number]) [id number] Page 8 of 14 -------------------------------------------------------------------------------- Problem List Details Patient Name: Date of Service: [name], [name] [name] [name]. 10/15/2022 10:30 [name] Medical Record Number: [id number] Patient Account Number: [id number] Date of Birth/Sex: Treating RN: [birth year] (67 y.o. M) Primary Care Provider: Tama [name] Other Clinician: Referring Provider: Treating Provider/Extender: [name] in Treatment: 26 Active  Problems ICD-10 Encounter Code Description Active Date MDM Diagnosis L97.522 Non-pressure chronic ulcer of other part of left foot with fat layer exposed 04/14/2022 No Yes M86.172 Other acute osteomyelitis, left ankle and foot 05/08/2022 No Yes L97.528 Non-pressure chronic ulcer of other part of left foot with other specified 04/14/2022 No Yes severity E11.621 Type 2 diabetes mellitus with foot ulcer 04/14/2022 No Yes E11.42 Type 2 diabetes mellitus with diabetic polyneuropathy 04/14/2022 No Yes S91.301A Unspecified open wound, right foot, initial encounter 07/16/2022 No Yes 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 07/16/2022 No Yes severity Inactive Problems Resolved Problems Electronic Signature(s) Signed: 10/15/2022 11:16:11 AM By: [name] Corwin DO Entered By: [name] on 10/15/2022 11:11:31 -------------------------------------------------------------------------------- Progress Note Details Patient Name: Date of Service: [name], [name] [name]. 10/15/2022 10:30 [name] Medical Record Number: [id number] Patient Account Number: [id number] Date of Birth/Sex: Treating RN: 05-31-1956 (67 y.o. M) Primary Care Provider: Tama [name] Other Clinician: Referring Provider: Treating Provider/Extender: [name], [name] in Treatment: 26 Subjective Chief Complaint Information obtained from Patient 10/16/20; patient is here with [name] substantial wound on his right foot in the setting of [name] recent transmetatarsal amputation. 04/14/2022; referral from podiatry for YISHAI, FAVRE [name] ([id number]) [id number] Page 9 of 14 postsurgical wound of nonhealing partial left fifth ray amputation, right foot (07/16/22): TMA dehiscence History of Present Illness (HPI) ADMISSION 10/16/20 This is [name] 67 year old man who is [name] type II diabetic. Initially seen by Dr. Sherral Hammers of vein and vascular on 09/10/2021 for bilateral lower  extremity rest pain right greater than left. His ABIs demonstrated monophasic waveforms at the ankles bilaterally with depressed toe pressures. His ABI in the right was 0.5 and on the left 0.28. There were no obtainable waveforms for TBI's. He underwent angiography on 09/10/2021 and had findings of severe PAD with 95% stenosis of the distal SFA. He also had peroneal and posterior tibial arteries occluded with significant collateralization in the calf there was reconstitution of the posterior tibial artery in the distal third of the calf. Ostia of the anterior tibial artery was not appreciated. He underwent [name] angioplasty of the superficial femoral artery. He required admission to hospital from 09/12/2021 through 09/22/2021 with right foot cellulitis and sepsis. On 3/13  he underwent [name] right below-knee popliteal to posterior tibial bypass using [name] greater saphenous vein. Ultimately however he required [name] right TMA by podiatry which I believe was done on 3/17 for progressive diabetic foot infection. He was discharged to [facility] skilled facility. He arrives in clinic today with [name] large wound area over the entirety of his amputation site extending proximally. The attempted flap closure of the amputation site and his TMA has failed and the wound extends under the attempt to closure. Still some sutures in place. There is an odor but he is not systemically unwell. He is due for follow-up arterial studies and has an appointment with vascular surgery on 10/28/2021. They are using wet-to-dry dressings at [facility]. Past medical history includes type 2 diabetes with peripheral neuropathy, hypertension, obstructive sleep apnea 4/20; patient presents for follow-up. He is being discharged from his facility at [facility] today. They have been doing Dakin's wet-to-dry dressings. He states that his fiance can help with dressing changes. He is getting [facility] home [facility] to come out 3 times [name] week once he leaves the facility.  He is scheduled to see vein and vascular on 5/12. He has not seen the wound himself. He puts covers over his face for wound exams. He currently denies systemic signs of infection. 4/28; patient admitted to the clinic 2 weeks ago with [name] dehisced right TMA in the setting of type 2 diabetes with significant PAD. He is status post revascularization. He has been discharged from the nursing home he was at and now is at home using Dakin's wet-to-dry dressings daily he has home health. He denies any systemic signs of infection 5/4; patient presents for follow-up. His fiance and home health have been changing his dressings. He currently denies systemic signs of infection. He continues to put sheet covers over his face during our wound encounters because he does not want [name] look at the wound. 5/25; Patient presents for follow-up. He missed his last clinic appointment. He had [name] bone biopsy and culture done at last clinic visit that showed acute osteomyelitis with growth of E. coli, stenotrophomonas maltophilia and enteroccocus faecalis. [name] referral to infectious disease was made. He has not heard from them yet. He has been using Dakin's wet-to-dry dressings. He is now more comfortable with looking at the wound bed. 6/6; the patient comes into clinic today with 2 wounds on the left foot which apparently have been there for several months. This was not known to assess but was known by Dr. Randie Heinz. In fact he underwent an angiogram yesterday. He had [name] laser atherectomy of the left posterior tibial artery with [name] 3 mm balloon angioplasty, also [name] stent of the last left SFA. 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 This is [name] lot better than the last time I saw it. He has been using Dakin's wet-to-dry. He has an appointment with infectious disease for [name] bone  biopsy which showed E. coli, stenotrophomonas and Enterococcus faecalis. The appointment is for tomorrow 04/14/2022 Mr. Andrez Spiva ajuddin is an uncontrolled type 2 diabetic on insulin that presents with [name] left foot ulcer. On 12/18/2021 he required [name] left fifth ray amputation. He had revision of this site on 6/17 by Dr. Allena Katz, podiatry. He states he has had [name] wound VAC on this area for the past 3 weeks.  It is unclear what the prior wound care has been. He has [name] history of osteomyelitis to this area and was treated with IV and oral antibiotics For 6 weeks by Dr. Luciana Axe of infectious disease. At his last follow-up on 02/20/2022 his CRP was trending down and antibiotics were stopped. He also has [name] history of peripheral arterial disease status post stenting to the left SFA and balloon angioplasty to the left posterior tibial artery On 12/2021. He has follow-up with vein and vascular at the end of the month. He also has [name] wound to the dorsal aspect of the left foot that he has been placing Betadine on. 10/19; patient presents for follow-up. He had [name] bone biopsy of the left foot at last clinic visit showed osteomyelitis. The bone culture showed Enterobacter cloacae, Enterococcus faecalis, Staphylococcus aureus, viridans group strep, actinotigum schaelii. He has been referred to infectious disease And has an appointment on October 25. He has been using Dakin's wet-to-dry dressing to the lateral left foot wound and Hydrofera Blue and Medihoney to the dorsal foot wound. He has no issues or complaints today. He denies systemic signs of infection. 10/26; patient presents for follow-up. He saw Dr. Luciana Axe yesterday with infectious disease and has been prescribed doxycycline and Levaquin for his chronic osteomyelitis. He has been using Dakin's wet-to-dry dressings to the lateral left foot wound and Medihoney and Hydrofera Blue to the dorsal wound. We discussed [name] skin graft for the dorsal foot wound and he would like to  proceed with insurance verification. 11/3; patient with [name] wound on the dorsal left foot and [name] substantial postsurgical area on the lateral left foot fifth ray amputation. He is also followed by Dr. Rosetta Posner of infectious disease. He had [name] bone biopsy from the left lateral foot that showed acute osteomyelitis. Swab culture showed multiple organisms. He has also been revascularized by infectious disease status post left SFA stenting and tibial laser atherectomy and angioplasty. An MRI of the left foot had previously shown osteomyelitis of the fifth met head we have been using Dakin's wet-to-dry to the left lateral foot and Medihoney and Hydrofera Blue to the wound on the dorsal foot. 11/13; HBO treatment was discussed at last clinic visit and patient would like to proceed with this. He has orientation today. He has been approved for Grafix. We have been using Hydrofera Blue and Medihoney to the left dorsal foot and collagen to the left lateral foot. Patient has home health that comes in for dressing changes. He currently has no issues or complaints today. 12/7; patient missed his last clinic appointment. He was last seen 3 weeks ago. He is currently off of antibiotics per ID. He completed [name] prolonged treatment. He has been using collagen to the lateral foot wound. He has been using Medihoney and Hydrofera Blue to the dorsal foot wound. He has been doing well with hyperbaric oxygen therapy. He has no issues or complaints today. He denies signs of of infection. 12/14; patient presents for follow-up. We have been using Grafix to the wound beds. He has had trouble equalizing the pressure in his ears during HBO treatments. It was recommended he follow-up with ENT He does not have an appointment yet. He currently denies signs of infection. . 12/21; patient presents for follow-up. He was seen by ENT and had tubes placed. He is continued HBO without issues. We have been using Grafix to the wound beds. There is  been improvement in wound healing. 12/21; patient presents for follow-up. He has  no issues or complaints today. There is been improvement in wound healing. 1/5; patient presents for follow-up. At last clinic visit Grafix was placed in standard fashion. He has no issues or complaints today. He is tolerating hyperbaric oxygen therapy well. He denies signs of infection. 1/11; patient presents for follow-up. We have been placing Grafix to the wound beds on the left foot. The dorsal left foot wound has healed. The left lateral foot wound has shown improvement in healing. Unfortunately has had dehiscence of his previous surgical wound site on the right foot. He currently denies signs of infection. [name], [name] ([id number]) [id number] Page 10 of 14 1/19; patient presents for follow-up. Grafix was placed to the left foot wound at last clinic visit. He has been using Dakin's wet-to-dry dressings to the right foot wound. He continues HBO therapy without issues. He has shown improvement in wound healing to the left foot. 1/26; patient presents for follow-up. We been placing Grafix to the left foot wound and patient has been using Dakin's wet-to-dry dressings to the right foot wound. There is been improvement in wound healing. He continues HBO therapy without issues. 2/2; patient presents for follow-up. We have been using Grafix to the left foot wound and Dakin's wet-to-dry dressings to the right foot wound. He has no issues or complaints today. 2/8; patient presents for follow-up. We have been using Grafix to the left foot wound and Dakin's wet-to-dry dressings to the right foot wound. He has no issues or complaints today. He continues HBO without issues. 2/15; patient presents for follow-up. We have been using Grafix to the wound beds. He continues HBO without issues. He has been offloading the wound beds with his surgical shoe. 2/22; patient presents for follow-up. We have  been using Grafix to the wound beds. He has no issues or complaints today. He continues HBO therapy without issues. 2/29; patient presents for follow-up. We have been using Grafix to the wound beds. There is been improvement in wound healing. He has no issues or complaints today. He continues HBO therapy. 3/7; patient presents for follow-up. We have been using Grafix to the wound beds. Yesterday patient had an arteriogram to the right lower extremity with [name] drug-eluting stent placed in the superficial femoral artery. He has no issues to report on today. 3/14; patient presents for follow-up. We have been using Grafix to the wound beds. He continues to do HBO therapy without issues. 3/21; patient presents for follow-up. We have been using Grafix to the wound bed. He continues to do HBO therapy. There continues to be improvement in wound healing. 3/29; patient presents for follow-up. We have been using Grafix to the wound beds. He continues to do HBO therapy. He has no issues or complaints today. There is been improvement in wound healing. 10/08/2022: Both wounds continue to improve. There is some periwound callus accumulation on both sites. No concern for infection. 4/11; patient presents for follow-up. We have been using Grafix to the wound beds. He has no issues or complaints today. He continues with HBO therapy. There is been improvement in healing to the left foot. The right foot wound is stable. We discussed doing the total contact cast on the right foot and. Patient was agreeable to proceeding with this. We will start this at next clinic visit. Patient History Information obtained from Patient, Chart. Family History Diabetes - Mother,Siblings, Heart Disease - Siblings, Hypertension - Siblings, Stroke - Mother, No family history of Cancer, Hereditary Spherocytosis, Kidney Disease, Lung Disease,  Seizures, Thyroid Problems, Tuberculosis. Social History Former smoker, Marital Status - Single,  Alcohol Use - Never, Drug Use - No History, Caffeine Use - Daily. Medical History Eyes Denies history of Cataracts, Glaucoma, Optic Neuritis Respiratory Patient has history of Asthma, Sleep Apnea Denies history of Aspiration, Chronic Obstructive Pulmonary Disease (COPD), Pneumothorax, Tuberculosis Cardiovascular Patient has history of Hypertension, Peripheral Venous Disease Denies history of Angina, Arrhythmia, Congestive Heart Failure, Coronary Artery Disease, Deep Vein Thrombosis, Hypotension, Myocardial Infarction, Peripheral Arterial Disease, Phlebitis, Vasculitis Endocrine Patient has history of Type II Diabetes Denies history of Type I Diabetes Integumentary (Skin) Denies history of History of Burn Musculoskeletal Patient has history of Osteomyelitis Denies history of Gout, Rheumatoid Arthritis, Osteoarthritis Neurologic Patient has history of Neuropathy Denies history of Dementia, Quadriplegia, Paraplegia, Seizure Disorder Hospitalization/Surgery History - 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 Allena Katz. - aortogram 12/08/2021 Dr. Karin Lieu [facility]. Medical [name] Surgical History Notes nd Psychiatric PTSD Objective Constitutional respirations regular, non-labored and within target range for patient.Marland Kitchen [name], [name] ([id number]) [id number] Page 11 of 14 Vitals Time Taken: 10:19 AM, Height: 72 in, Weight: 220 lbs, BMI: 29.8, Temperature: 98.3 F, Pulse: 74 bpm, Respiratory Rate: 20 breaths/min, Blood Pressure: 156/117 mmHg, Capillary Blood Glucose: 206 mg/dl. Cardiovascular 2+ dorsalis pedis/posterior tibialis pulses. Psychiatric pleasant and cooperative. General Notes: T the plantar aspect of the right foot there is an open wound with nonviable tissue, granulation tissue and callus T the lateral aspect of the left o o foot there is an open wound with granulation tissue slough and callus. No surrounding signs of infection to the  wound bed. Integumentary (Hair, Skin) Wound #5 status is Open. Original cause of wound was Surgical Injury. The date acquired was: 12/20/2021. The wound has been in treatment 26 weeks. The wound is located on the Left,Lateral Foot. The wound measures 0.5cm length x 0.3cm width x 0.2cm depth; 0.118cm^2 area and 0.024cm^3 volume. There is Fat Layer (Subcutaneous Tissue) exposed. There is no tunneling or undermining noted. There is [name] medium amount of serosanguineous drainage noted. The wound margin is epibole. There is large (67-100%) red granulation within the wound bed. There is [name] small (1-33%) amount of necrotic tissue within the wound bed including Adherent Slough. The periwound skin appearance had no abnormalities noted for color. The periwound skin appearance exhibited: Callus. The periwound skin appearance did not exhibit: Crepitus, Excoriation, Induration, Rash, Scarring, Dry/Scaly, Maceration. Periwound temperature was noted as No Abnormality. The periwound has tenderness on palpation. Wound #6 status is Open. Original cause of wound was Gradually Appeared. The date acquired was: 07/16/2022. The wound has been in treatment 13 weeks. The wound is located on the Right Amputation Site - Transmetatarsal. The wound measures 0.2cm length x 0.2cm width x 0.4cm depth; 0.031cm^2 area and 0.013cm^3 volume. There is Fat Layer (Subcutaneous Tissue) exposed. There is [name] small amount of serosanguineous drainage noted. The wound margin is distinct with the outline attached to the wound base. There is large (67-100%) red granulation within the wound bed. There is [name] small (1-33%) amount of necrotic tissue within the wound bed including Adherent Slough. The periwound skin appearance had no abnormalities noted for color. The periwound skin appearance exhibited: Callus, Dry/Scaly. The periwound skin appearance did not exhibit: Crepitus, Excoriation, Induration, Rash, Scarring, Maceration. Periwound temperature was  noted as No Abnormality. Assessment Active Problems ICD-10 Non-pressure chronic ulcer of other part of left foot with fat layer exposed Other acute osteomyelitis, left ankle and foot Non-pressure chronic ulcer of other  part of left foot with other specified severity Type 2 diabetes mellitus with foot ulcer Type 2 diabetes mellitus with diabetic polyneuropathy Unspecified open wound, right foot, initial encounter 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 severity Patient's right lower extremity wound is stable. I debrided nonviable tissue. Grafix was placed in standard fashion. I recommended at this time [name] total contact cast. We will proceed with this at next clinic visit. The left foot appears well-healing. It has improved in size and. Since last clinic visit. I debrided nonviable tissue here. Part of Grafix was placed here as well. Follow-up in 1 week. Continue HBO therapy. Procedures Wound #5 Pre-procedure diagnosis of Wound #5 is [name] Diabetic Wound/Ulcer of the Lower Extremity located on the Left,Lateral Foot .Severity of Tissue Pre Debridement is: Fat layer exposed. There was [name] Selective/Open Wound Non-Viable Tissue Debridement with [name] total area of 0.15 sq cm performed by [name] Corwin, DO. With the following instrument(s): Curette to remove Non-Viable tissue/material. Material removed includes Callus and Slough and after achieving pain control using Lidocaine 5% topical ointment. No specimens were taken. [name] time out was conducted at 10:58, prior to the start of the procedure. [name] Minimum amount of bleeding was controlled with Pressure. The procedure was tolerated well. Post Debridement Measurements: 0.5cm length x 0.3cm width x 0.2cm depth; 0.024cm^3 volume. Character of Wound/Ulcer Post Debridement is improved. Severity of Tissue Post Debridement is: Fat layer exposed. Post procedure Diagnosis Wound #5: Same as Pre-Procedure General Notes: Scribed for Dr  [name] by [name] Pulling, RN. 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 [name] skin graft procedure using [name] bioengineered skin substitute/cellular or tissue based product was performed by [name] Corwin, DO with the following instrument(s): Forceps. Grafix prime was applied. 1 sq cm of product was utilized and 0 sq cm was wasted due to wound size. [name] Time Out was conducted at 10:58, prior to the start of the procedure. The procedure was tolerated well with [name] pain level of 0 throughout and [name] pain level of 0 following the procedure. Post procedure Diagnosis Wound #5: Same as Pre-Procedure General Notes: Scribed for Dr [name] by [name] Pulling, RN. 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 There was [name] Excisional Skin/Subcutaneous Tissue Debridement with [name] total area of 0.25 sq cm performed by [name] Corwin, DO. With the following instrument(s): Curette to remove Non-Viable tissue/material. Material removed includes Callus, Subcutaneous Tissue, and Slough after achieving pain control using Lidocaine 5% topical ointment. No specimens were taken. [name] time out was conducted at 10:58, prior to the start of the procedure. [name] Minimum amount of bleeding was controlled with Pressure. The procedure was tolerated well. Post Debridement Measurements: 0.5cm length x 0.5cm width x 0.5cm depth; 0.098cm^3 volume. Character of Wound/Ulcer Post Debridement is improved. Severity of Tissue Post Debridement is: Fat layer exposed. Post procedure Diagnosis Wound #6: Same as Pre-Procedure General Notes: Scribed for Dr [name] by [name] Pulling, RN. 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 [name]  skin graft procedure using [name] bioengineered skin substitute/cellular or tissue based product was performed by [name] Corwin, DO with the following instrument(s): [name], [name] ([id number]) [id number] Page 12 of 14 Forceps. Grafix prime was applied.  0 sq cm of product was utilized and 1 sq cm was wasted due to wound size. [name] Time Out was conducted at 10:58, prior to the start of the procedure. The procedure was tolerated well with [name] pain level of 0 throughout and [name] pain level of 0 following the procedure. Post procedure Diagnosis Wound #6: Same as Pre-Procedure General Notes: Scribed for Dr [name] by [name] Pulling, RN. Plan Follow-up Appointments: Return Appointment in 1 week. - Dr. Mikey [name] Tuesday 10/20/2022 1115 after Hyperbarics. for cast Other: - Dr [name] Thursday 10/22/22 @ 1030 after hyperbaric for cast change Anesthetic: (In clinic) Topical Lidocaine 5% applied to wound bed Cellular or Tissue Based Products: Cellular or Tissue Based Product Type: - Grafix # 3 applied 06/25/22 GRafix # 4 applied 07/02/22 Grafix # 5 applied 07/10/22 Grafix # 6 applied 07/17/22 Grafix # 7 applied 07/24/22---Also, go ahead and run IVR for more Grafix for left lateral foot Grafix #8 applied 07/31/2022 Grafix # 9 applied 08/07/22] left foot Grafix #10 applied 08/13/2022 left and right foot wounds today split the grafix in half. Graftx #11 applied to both wounds. Patient right foot approved for total of 12 between dates 08/18/2022-11/04/2022. Grafix #12 applied to both wounds. grafix #4 right foot split grafix and use to left foot #13. total can have 24. Grafix # 5 applied to right foot split grafix and use to left foot # 14. Grafix #6 right foot and Grafix #16 to left foot applied. Grafix #7 right foot and Grafix #17 left foot applied. Grafix #8 right foot and Grafix #18 left foot applied Grafix #9 right foot and Grafix #19 left foot applied Cellular or Tissue Based Product applied to  wound bed, secured with steri-strips, cover with Adaptic or Mepitel. (DO NOT REMOVE). Bathing/ Shower/ Hygiene: May shower with protection but do not get wound dressing(s) wet. Protect dressing(s) with water repellant cover (for example, large plastic bag) or [name] cast cover and may then take shower. Edema Control - Lymphedema / SCD / Other: Avoid standing for long periods of time. Off-Loading: T Contact Cast to Right Lower Extremity - T be placed Tuesday 10/20/22 otal o Open toe surgical shoe to: - wear when standing or walking. Hyperbaric Oxygen Therapy: Evaluate for HBO Therapy - 07/24/22- Pt. to finish next 8 HBO treatments 07/24/22- DR. George Alcantar wants pt. 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 Follow Hyperbaric Oxygen Glycemia Protocol Afrin (Oxymetazoline HCL) 0.05% nasal spray - 1 spray in both nostrils daily as needed prior to HBO treatment for difficulty clearing ears The following medication(s) was prescribed: lidocaine topical 5 % ointment ointment topical once daily for prior to debridement was prescribed at facility WOUND #5: - Foot Wound Laterality: Left, Lateral Secondary Dressing: ABD Pad, 5x9 1 x Per Day/30 Days Discharge Instructions: Apply over primary dressing as directed. Secured With: [employer], 4.5x3.1 (in/yd) 1 x Per Day/30 Days Discharge Instructions: Secure with Kerlix as directed. Secured With: 13M Medipore H Soft Cloth Surgical T ape, 4 x 10 (in/yd) 1 x Per Day/30 Days Discharge Instructions: Secure with tape as directed. WOUND #6: - Amputation Site - Transmetatarsal Wound Laterality: Right Secondary Dressing:  ABD  Pad, 5x9 1 x Per Day/30 Days Discharge Instructions: Apply over primary dressing as directed. Secured With: [employer], 4.5x3.1 (in/yd) 1 x Per Day/30 Days Discharge Instructions: Secure with Kerlix as directed. Secured With: 40M Medipore H Soft Cloth Surgical T ape, 4 x 10 (in/yd) 1 x Per Day/30 Days Discharge Instructions: Secure with tape as directed. 1. In office sharp debridement 2. Grafix placed in standard fashion 3. Follow-up in 1 week 4. Continue HBO therapy 5. Continue aggressive offloadingoosurgical shoe with peg assist Electronic Signature(s) Signed: 10/26/2022 9:25:08 AM By: [name] Corwin DO Signed: 11/11/2022 4:57:17 PM By: [name] Stall RN, [name] Previous Signature: 10/16/2022 3:51:37 PM Version By: [name] Stall RN, [name] Previous Signature: 10/19/2022 11:42:23 AM Version By: [name] Corwin DO Previous Signature: 10/15/2022 11:16:11 AM Version By: [name] Corwin DO Entered By: [name] on 10/21/2022 13:52:46 -------------------------------------------------------------------------------- HxROS Details Patient Name: Date of Service: [name] [name], [name] [name]. 10/15/2022 10:30 [name] Medical Record Number: [id number] Patient Account Number: [id number] Date of Birth/Sex: Treating RN: 15-Jan-1956 (67 y.o. M) Primary Care Provider: Tama [name] Other Clinician: Referring Provider: Treating Provider/Extender: [name], [name] in Treatment: [address] [place] [name] ([id number]) [id number] Page 13 of 14 Information Obtained From Patient Chart Eyes Medical History: Negative for: Cataracts; Glaucoma; Optic Neuritis Respiratory Medical History: Positive for: Asthma; Sleep Apnea Negative for: Aspiration; Chronic Obstructive Pulmonary Disease (COPD); Pneumothorax; Tuberculosis Cardiovascular Medical History: Positive for: Hypertension; Peripheral Venous Disease Negative for: Angina; Arrhythmia; Congestive Heart Failure;  Coronary Artery Disease; Deep Vein Thrombosis; Hypotension; Myocardial Infarction; Peripheral Arterial Disease; Phlebitis; Vasculitis Endocrine Medical History: Positive for: Type II Diabetes Negative for: Type I Diabetes Time with diabetes: 20 years Treated with: Insulin, Oral agents Blood sugar tested every day: Yes Tested : 4x day Integumentary (Skin) Medical History: Negative for: History of Burn Musculoskeletal Medical History: Positive for: Osteomyelitis Negative for: Gout; Rheumatoid Arthritis; Osteoarthritis Neurologic Medical History: Positive for: Neuropathy Negative for: Dementia; Quadriplegia; Paraplegia; Seizure Disorder Psychiatric Medical History: Past Medical History Notes: PTSD Immunizations Pneumococcal Vaccine: Received Pneumococcal Vaccination: No Implantable Devices None Hospitalization / Surgery History Type of Hospitalization/Surgery 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 Allena Katz aortogram 12/08/2021 Dr. Karin Lieu [facility] Family and Social History Cancer: No; Diabetes: Yes - Mother,Siblings; Heart Disease: Yes - Siblings; Hereditary Spherocytosis: No; Hypertension: Yes - Siblings; Kidney Disease: No; Lung Disease: No; Seizures: No; Stroke: Yes - Mother; Thyroid Problems: No; Tuberculosis: No; Former smoker; Marital Status - Single; Alcohol Use: Never; Drug Use: No History; Caffeine Use: Daily; Financial Concerns: No; Food, Clothing or Shelter Needs: No; Support System Lacking: No; Transportation Concerns: No Electronic Signature(s) Signed: 10/15/2022 11:16:11 AM By: [name] Corwin DO Entered By: [name] on 10/15/2022 11:12:57 [name] ([id number]) [id number] Page 14 of 14 -------------------------------------------------------------------------------- SuperBill Details Patient Name: Date of Service: [name] [name], [name] [name] [name]. 10/15/2022 Medical Record Number: [id number] Patient Account Number:  [id number] Date of Birth/Sex: Treating RN: 03/15/1956 (67 y.o. M) Primary Care Provider: Tama [name] Other Clinician: Referring Provider: Treating Provider/Extender: [name], [name] in Treatment: 26 Diagnosis Coding ICD-10 Codes Code Description [phone] Non-pressure chronic ulcer of other part of left foot with fat layer exposed M86.172 Other acute osteomyelitis, left ankle and foot L97.528 Non-pressure chronic ulcer of other part of left foot with other specified severity E11.621 Type 2 diabetes mellitus with foot ulcer E11.42 Type 2 diabetes mellitus with diabetic polyneuropathy S91.301A Unspecified open wound, right foot, initial encounter L97.518 Non-pressure chronic ulcer of other part  of right foot with other specified severity Facility Procedures : CPT4 Code: 16109604 Description: Q4133- Grafix PL 16 mm disc (2 units) Modifier: Quantity: 2 : CPT4 Code: [id number] Description: 15275 - SKIN SUB GRAFT FACE/NK/HF/G ICD-10 Diagnosis Description E11.621 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 seve L97.522 Non-pressure chronic ulcer of  other part of left foot with fat layer exposed Modifier: rity Quantity: 1 Physician Procedures : CPT4 Code Description Modifier [id number] 15275 - WC PHYS SKIN SUB GRAFT FACE/NK/HF/G ICD-10 Diagnosis Description E11.621 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  severity L97.522 Non-pressure chronic ulcer of other part of left foot with fat layer exposed Quantity: 1 Electronic Signature(s) Signed: 10/26/2022 9:25:08 AM By: [name] Corwin DO Signed: 11/11/2022 4:57:17 PM By: [name] Stall RN, [name] Previous Signature: 10/15/2022 11:16:11 AM Version By: [name] Corwin DO Entered By: [name] on 10/21/2022 13:53:12

## 2022-10-15 NOTE — Progress Notes (Signed)
[name], [name] ([id number]) [id number] Page 1 of 8 Visit Report for 10/15/2022 Arrival Information Details Patient Name: Date of Service: [name], [name]. 10/15/2022 10:30 [name] Medical Record Number: [id number] Patient Account Number: [id number] Date of Birth/Sex: Treating RN: 12/16/1955 (67 y.o. [name]) [name], [name] Primary Care [name]: [name], [name] Other Clinician: Referring Ninoshka Wainwright: Treating Katleen Carraway/Extender: [name], [name], [name] Weeks in Treatment: 26 Visit Information History Since Last Visit Added or deleted any medications: No Patient Arrived: Wheel Chair Any new allergies or adverse reactions: No Arrival Time: 10:44 Had [name] fall or experienced change in No Accompanied By: spouse activities of daily living that may affect Transfer Assistance: None risk of falls: Patient Identification Verified: Yes Signs or symptoms of abuse/neglect since last visito No Secondary Verification Process Completed: Yes Hospitalized since last visit: No Patient Requires Transmission-Based Precautions: No Implantable device outside of the clinic excluding No Patient Has Alerts: Yes cellular tissue based products placed in the center since last visit: Has Dressing in Place as Prescribed: Yes Pain Present Now: No Electronic Signature(s) Signed: 10/15/2022 4:44:09 PM By: Redmond PullingPalmer, Carrie RN, [name] Entered By: Redmond PullingPalmer, [name] on 10/15/2022 10:45:16 -------------------------------------------------------------------------------- Encounter Discharge Information Details Patient Name: Date of Service: [name], [name]. 10/15/2022 10:30 [name] Medical Record Number: [id number] Patient Account Number: [id number] Date of Birth/Sex: Treating RN: 12/24/1955 (67 y.o. [name]) [name], [name] Primary Care [name]: [name], [name] Other Clinician: Referring [name]: Treating [name]/Extender: [name], [name], [name] Weeks in Treatment: 26 Encounter Discharge  Information Items Post Procedure Vitals Discharge Condition: Stable Temperature (F): 98.3 Ambulatory Status: Ambulatory Pulse (bpm): 74 Discharge Destination: Home Respiratory Rate (breaths/min): 18 Transportation: Private Auto Blood Pressure (mmHg): 156/117 Accompanied By: spouse Schedule Follow-up Appointment: Yes Clinical Summary of Care: Patient Declined Electronic Signature(s) Signed: 10/15/2022 4:44:09 PM By: Redmond PullingPalmer, Carrie RN, [name] Entered By: Redmond PullingPalmer, [name] on 10/15/2022 11:56:59 -------------------------------------------------------------------------------- Lower Extremity Assessment Details Patient Name: Date of Service: [name], [name]. 10/15/2022 10:30 [name] Medical Record Number: [id number] Patient Account Number: [id number] Date of Birth/Sex: Treating RN: 05/10/1956 (67 y.o. [name]) [name], [name] Primary Care Crystalee Ventress: [name], [name] Other Clinician: Referring Burleigh Brockmann: Treating [name]/Extender: [name] DollarHoffman, [name], [name] Weeks in Treatment: 26 Edema Assessment Assessed: [Left: No] [Right: No] T[LeftArnetha [name]: [name], [name] ([id number])]([id number])] [Right: [id number] Page 2 of 8] Edema: [Left: No] [Right: No] Calf Left: Right: Point of Measurement: 36 cm From Medial Instep 39 cm 38 cm Ankle Left: Right: Point of Measurement: 9 cm From Medial Instep 22 cm 22.5 cm Vascular Assessment Pulses: Dorsalis Pedis Palpable: [Left:Yes] [Right:Yes] Electronic Signature(s) Signed: 10/15/2022 4:44:09 PM By: Redmond PullingPalmer, Carrie RN, [name] Entered By: Redmond PullingPalmer, [name] on 10/15/2022 10:47:13 -------------------------------------------------------------------------------- Multi Wound Chart Details Patient Name: Date of Service: [name], [name]. 10/15/2022 10:30 [name] Medical Record Number: [id number] Patient Account Number: [id number] Date of Birth/Sex: Treating RN: 08/21/1955 (67 y.o. M) Primary Care Taylormarie Register: [name], [name] Other Clinician: Referring  Onofrio Klemp: Treating [name]/Extender: [name] DollarHoffman, [name], [name] Weeks in Treatment: 26 Vital Signs Height(in): 72 Capillary Blood Glucose(mg/dl): 235206 Weight(lbs): 573220 Pulse(bpm): 74 Body Mass Index(BMI): 29.8 Blood Pressure(mmHg): 149/93 Temperature(F): 98.3 Respiratory Rate(breaths/min): 20 [5:Photos:] [[name]:[name]] Left, Lateral Foot Right Amputation Site - [name] Wound Location: Transmetatarsal Surgical Injury Gradually Appeared [name] Wounding Event: Diabetic Wound/Ulcer of the Lower Diabetic Wound/Ulcer of the Lower [name] Primary Etiology: Extremity Extremity Open Surgical Wound [name] [name] Secondary Etiology: Asthma, Sleep Apnea, Hypertension, Asthma, Sleep Apnea, Hypertension, [name] Comorbid History: Peripheral Venous Disease, Type II Peripheral Venous  Disease, Type II Diabetes, Osteomyelitis, Neuropathy Diabetes, Osteomyelitis, Neuropathy 12/20/2021 07/16/2022 [name] Date Acquired: 63 13 [name] Weeks of Treatment: Open Open [name] Wound Status: No No [name] Wound Recurrence: 0.5x0.3x0.2 0.2x0.2x0.4 [name] Measurements L x W x D (cm) 0.118 0.031 [name] [name] (cm) : rea 0.024 0.013 [name] Volume (cm) : 98.80% 96.80% [name] % Reduction in [name] rea: 99.50% 98.70% [name] % Reduction in Volume: Grade 3 Grade 2 [name] Classification: Medium Small [name] Exudate [name] mount: Serosanguineous Serosanguineous [name] Exudate Type: red, brown red, brown [name] Exudate Color: Epibole Distinct, outline attached [name] Wound Margin: Large (67-100%) Large (67-100%) [name] Granulation [name] mount: Red Red [name] Granulation QualityJARRAH, [name] [name] ([id number]) [id number] Page 3 of 8 Small (1-33%) Small (1-33%) [name] Necrotic Amount: Fat Layer (Subcutaneous Tissue): Yes Fat Layer (Subcutaneous Tissue): Yes [name] Exposed Structures: Fascia: No Fascia: No Tendon: No Tendon: No Muscle: No Muscle: No Joint: No Joint: No Bone: No Bone: No Large (67-100%) Small (1-33%) [name] Epithelialization: Debridement  - Selective/Open Wound Debridement - Excisional [name] Debridement: Pre-procedure Verification/Time Out 10:58 10:58 [name] Taken: Lidocaine 5% topical ointment Lidocaine 5% topical ointment [name] Pain Control: Callus, Slough Callus, Subcutaneous, Slough [name] Tissue Debrided: Non-Viable Tissue Skin/Subcutaneous Tissue [name] Level: 0.15 0.25 [name] Debridement [name] (sq cm): rea Curette Curette [name] Instrument: Minimum Minimum [name] Bleeding: Pressure Pressure [name] Hemostasis [name] chieved: Procedure was tolerated well Procedure was tolerated well [name] Debridement Treatment Response: 0.5x0.3x0.2 0.5x0.5x0.5 [name] Post Debridement Measurements L x W x D (cm) 0.024 0.098 [name] Post Debridement Volume: (cm) Callus: Yes Callus: Yes [name] Periwound Skin Texture: Excoriation: No Excoriation: No Induration: No Induration: No Crepitus: No Crepitus: No Rash: No Rash: No Scarring: No Scarring: No Maceration: No Dry/Scaly: Yes [name] Periwound Skin Moisture: Dry/Scaly: No Maceration: No Atrophie Blanche: No Atrophie Blanche: No [name] Periwound Skin Color: Cyanosis: No Cyanosis: No Ecchymosis: No Ecchymosis: No Erythema: No Erythema: No Hemosiderin Staining: No Hemosiderin Staining: No Mottled: No Mottled: No Pallor: No Pallor: No Rubor: No Rubor: No No Abnormality No Abnormality [name] Temperature: Yes [name] [name] Tenderness on Palpation: Cellular or Tissue Based Product Cellular or Tissue Based Product [name] Procedures Performed: Debridement Debridement Treatment Notes Electronic Signature(s) Signed: 10/15/2022 11:16:11 AM By: Geralyn Corwin DO Entered By: Geralyn Corwin on 10/15/2022 11:11:37 -------------------------------------------------------------------------------- Multi-Disciplinary Care Plan Details Patient Name: Date of Service: [name] Cousins, [name]. 10/15/2022 10:30 [name] Medical Record Number: [id number] Patient Account Number: [id number] Date of Birth/Sex: Treating RN: 05-31-1956 (67  y.o. [name] Cools Primary Care Kasra Melvin: [name] Headings Other Clinician: Referring Valrie Jia: Treating Miken Stecher/Extender: [name] Bucks in Treatment: 26 Multidisciplinary Care Plan reviewed with physician Active Inactive Osteomyelitis Nursing Diagnoses: Infection: osteomyelitis Goals: Diagnostic evaluation for osteomyelitis completed as ordered Date Initiated: 04/23/2022 Date Inactivated: 10/08/2022 Target Resolution Date: 10/09/2022 Goal Status: Met Signs and symptoms for osteomyelitis will be recognized and promptly addressed Date Initiated: 04/23/2022 Target Resolution Date: 10/23/2022 Goal Status: Active Interventions: TELVIS, GORSUCH ([id number]) [phone].pdf Page 4 of 8 Assess for signs and symptoms of osteomyelitis resolution every visit Provide education on osteomyelitis Screen for HBO Treatment Activities: Biopsy : 04/16/2022 Consult for HBO : 04/23/2022 Surgical debridement : 04/23/2022 Systemic antibiotics : 04/23/2022 T ordered outside of clinic : 04/23/2022 est Notes: Wound/Skin Impairment Nursing Diagnoses: Knowledge deficit related to ulceration/compromised skin integrity Goals: Patient/caregiver will verbalize understanding of skin care regimen Date Initiated: 04/14/2022 Target Resolution Date: 11/06/2022 Goal Status: Active Interventions: Assess patient/caregiver ability to perform ulcer/skin care regimen upon admission and as needed  Assess ulceration(s) every visit Provide education on ulcer and skin care Notes: Electronic Signature(s) Signed: 10/15/2022 4:44:09 PM By: Redmond Pulling RN, [name] Entered By: Redmond Pulling on 10/15/2022 10:57:21 -------------------------------------------------------------------------------- Pain Assessment Details Patient Name: Date of Service: [name] Cousins, [name]. 10/15/2022 10:30 [name] Medical Record Number: [id number] Patient Account Number: [id number] Date of  Birth/Sex: Treating RN: 1955-11-03 (67 y.o. [name] Cools Primary Care Mariann Palo: [name] Headings Other Clinician: Referring Glen Blatchley: Treating Mckynleigh Mussell/Extender: [name] Bucks in Treatment: 26 Active Problems Location of Pain Severity and Description of Pain Patient Has Paino No Site Locations Pain Management and Medication Current Pain Management: Electronic Signature(s) [name], [name] ([id number]) [id number] Page 5 of 8 Signed: 10/15/2022 4:44:09 PM By: Redmond Pulling RN, [name] Entered By: Redmond Pulling on 10/15/2022 10:45:58 -------------------------------------------------------------------------------- Patient/Caregiver Education Details Patient Name: Date of Service: [name] Cousins, Londell Moh 4/11/2024andnbsp10:30 [name] Medical Record Number: [id number] Patient Account Number: [id number] Date of Birth/Gender: Treating RN: 1955/10/23 (67 y.o. [name] Cools Primary Care Physician: [name] Headings Other Clinician: Referring Physician: Treating Physician/Extender: [name] Bucks in Treatment: 26 Education Assessment Education Provided To: Patient Education Topics Provided Wound/Skin Impairment: Methods: Explain/Verbal Responses: State content correctly [name]) Signed: 10/15/2022 4:44:09 PM By: Redmond Pulling RN, [name] Entered By: Redmond Pulling on 10/15/2022 10:57:37 -------------------------------------------------------------------------------- Wound Assessment Details Patient Name: Date of Service: [name] Cousins, [name]. 10/15/2022 10:30 [name] Medical Record Number: [id number] Patient Account Number: [id number] Date of Birth/Sex: Treating RN: 01/24/1956 (67 y.o. [name] Cools Primary Care Trevel Dillenbeck: [name] Headings Other Clinician: Referring Marlisha Vanwyk: Treating Rodric Punch/Extender: [name], [name] Weeks in Treatment: 26 Wound Status Wound Number: 5 Primary Diabetic  Wound/Ulcer of the Lower Extremity Etiology: Wound Location: Left, Lateral Foot Secondary Open Surgical Wound Wounding Event: Surgical Injury Etiology: Date Acquired: 12/20/2021 Wound Open Weeks Of Treatment: 26 Status: Clustered Wound: No Comorbid Asthma, Sleep Apnea, Hypertension, Peripheral Venous Disease, History: Type II Diabetes, Osteomyelitis, Neuropathy Photos Wound Measurements Length: (cm) 0.5 Width: (cm) 0.3 Depth: (cm) 0.2 Area: (cm) 0.1 Volume: (cm) 0.0 [name], [name] ([id number]) % Reduction in Area: 98.8% % Reduction in Volume: 99.5% Epithelialization: Large (67-100%) 18 Tunneling: No 24 Undermining: No [id number] Page 6 of 8 Wound Description Classification: Grade 3 Wound Margin: Epibole Exudate Amount: Medium Exudate Type: Serosanguineous Exudate Color: red, brown Foul Odor After Cleansing: No Slough/Fibrino No Wound Bed Granulation Amount: Large (67-100%) Exposed Structure Granulation Quality: Red Fascia Exposed: No Necrotic Amount: Small (1-33%) Fat Layer (Subcutaneous Tissue) Exposed: Yes Necrotic Quality: Adherent Slough Tendon Exposed: No Muscle Exposed: No Joint Exposed: No Bone Exposed: No Periwound Skin Texture Texture Color No Abnormalities Noted: No No Abnormalities Noted: Yes Callus: Yes Temperature / Pain Crepitus: No Temperature: No Abnormality Excoriation: No Tenderness on Palpation: Yes Induration: No Rash: No Scarring: No Moisture No Abnormalities Noted: No Dry / Scaly: No Maceration: No Treatment Notes Wound #5 (Foot) Wound Laterality: Left, Lateral Cleanser Peri-Wound Care Topical Primary Dressing Secondary Dressing ABD Pad, 5x9 Discharge Instruction: Apply over primary dressing as directed. Secured With [employer], 4.5x3.1 (in/yd) Discharge Instruction: Secure with Kerlix as directed. 70M Medipore H Soft Cloth Surgical T ape, 4 x 10 (in/yd) Discharge Instruction:  Secure with tape as directed. Compression Wrap Compression Stockings Add-Ons Electronic Signature(s) Signed: 10/15/2022 4:44:09 PM By: Redmond Pulling RN, [name] Entered By: Redmond Pulling on 10/15/2022 10:53:37 -------------------------------------------------------------------------------- Wound Assessment Details Patient Name: Date of Service: TA [name], [name]. 10/15/2022 10:30  [name] Medical Record Number: [id number] Patient Account Number: [id number] Date of Birth/Sex: Treating RN: [birth year] (67 y.o. [name] Cools Primary Care Mei Suits: [name] Headings Other Clinician: Referring Danylah Holden: Treating Nhyla Nappi/Extender: [name], [name] Weeks in Treatment: [address] Wound Status [name], [name] ([id number]) [id number] Page 7 of 8 Wound Number: 6 Primary Diabetic Wound/Ulcer of the Lower Extremity Etiology: Wound Location: Right Amputation Site - Transmetatarsal Wound Open Wounding Event: Gradually Appeared Status: Date Acquired: 07/16/2022 Comorbid Asthma, Sleep Apnea, Hypertension, Peripheral Venous Disease, Weeks Of Treatment: 13 History: Type II Diabetes, Osteomyelitis, Neuropathy Clustered Wound: No Photos Wound Measurements Length: (cm) 0.2 Width: (cm) 0.2 Depth: (cm) 0.4 Area: (cm) 0.031 Volume: (cm) 0.013 % Reduction in Area: 96.8% % Reduction in Volume: 98.7% Epithelialization: Small (1-33%) Wound Description Classification: Grade 2 Wound Margin: Distinct, outline attached Exudate Amount: Small Exudate Type: Serosanguineous Exudate Color: red, brown Foul Odor After Cleansing: No Slough/Fibrino Yes Wound Bed Granulation Amount: Large (67-100%) Exposed Structure Granulation Quality: Red Fascia Exposed: No Necrotic Amount: Small (1-33%) Fat Layer (Subcutaneous Tissue) Exposed: Yes Necrotic Quality: Adherent Slough Tendon Exposed: No Muscle Exposed: No Joint Exposed: No Bone Exposed: No Periwound Skin Texture Texture  Color No Abnormalities Noted: No No Abnormalities Noted: Yes Callus: Yes Temperature / Pain Crepitus: No Temperature: No Abnormality Excoriation: No Induration: No Rash: No Scarring: No Moisture No Abnormalities Noted: No Dry / Scaly: Yes Maceration: No Treatment Notes Wound #6 (Amputation Site - Transmetatarsal) Wound Laterality: Right Cleanser Peri-Wound Care Topical Primary Dressing Secondary Dressing ABD Pad, 5x9 Discharge Instruction: Apply over primary dressing as directed. Secured With [name], [name] ([id number]) [id number] Page 8 of 8 Kerlix Roll Sterile, 4.5x3.1 (in/yd) Discharge Instruction: Secure with Kerlix as directed. 46M Medipore H Soft Cloth Surgical T ape, 4 x 10 (in/yd) Discharge Instruction: Secure with tape as directed. Compression Wrap Compression Stockings Add-Ons Electronic Signature(s) Signed: 10/15/2022 4:44:09 PM By: Redmond Pulling RN, [name] Entered By: Redmond Pulling on 10/15/2022 10:54:19 -------------------------------------------------------------------------------- Vitals Details Patient Name: Date of Service: [name] Cousins, [name]. 10/15/2022 10:30 [name] Medical Record Number: [id number] Patient Account Number: [id number] Date of Birth/Sex: Treating RN: 02-27-1956 (67 y.o. [name] Cools Primary Care Augustus Zurawski: [name] Headings Other Clinician: Referring Maren Wiesen: Treating Kerisha Goughnour/Extender: [name], [name] Weeks in Treatment: 26 Vital Signs Time Taken: 10:19 Temperature (F): 98.3 Height (in): 72 Pulse (bpm): 74 Weight (lbs): 220 Respiratory Rate (breaths/min): 20 Body Mass Index (BMI): 29.8 Blood Pressure (mmHg): 156/117 Capillary Blood Glucose (mg/dl): 564 Reference Range: 80 - 120 mg / dl Electronic Signature(s) Signed: 10/15/2022 4:44:09 PM By: Redmond Pulling RN, [name] Entered By: Redmond Pulling on 10/15/2022 11:56:49

## 2022-10-15 NOTE — Progress Notes (Signed)
DIJON, MCMEANS [name] ([id number]) [id number] Page 1 of 2 Visit Report for 10/14/2022 Problem List Details Patient Name: Date of Service: [name], [name] 10/14/2022 8:00 [name] Medical Record Number: [id number] Patient Account Number: [id number] Date of Birth/Sex: Treating RN: [name] (67 y.o. [name] Primary Care Provider: Tama [name] Other Clinician: Karl [name] Referring Provider: Treating Provider/Extender: [name], [name] in Treatment: 26 Active Problems ICD-10 Encounter Code Description Active Date MDM Diagnosis [phone] Non-pressure chronic ulcer of other part of left foot with fat layer 04/14/2022 No Yes exposed M86.172 Other acute osteomyelitis, left ankle and foot 05/08/2022 No Yes L97.528 Non-pressure chronic ulcer of other part of left foot with other 04/14/2022 No Yes specified severity E11.621 Type 2 diabetes mellitus with foot ulcer 04/14/2022 No Yes E11.42 Type 2 diabetes mellitus with diabetic polyneuropathy 04/14/2022 No Yes S91.301A Unspecified open wound, right foot, initial encounter 07/16/2022 No Yes 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 07/16/2022 No Yes specified severity Inactive Problems Resolved Problems Electronic Signature(s) Signed: 10/14/2022 1:49:13 PM By: [name] EMT Signed: 10/14/2022 5:51:12 PM By: [name] Entered By: [name] on 10/14/2022 13:49:12 -------------------------------------------------------------------------------- SuperBill Details Patient Name: Date of Service: [name], [name] 10/14/2022 Medical Record Number: [id number] Patient Account Number: [id number] Date of Birth/Sex: Treating RN: [birth year][name] (67 y.o. [name] Primary Care Provider: Tama [name] Other Clinician: Karl [name] Referring Provider: Treating Provider/Extender: [name], [name] in Treatment: [address] [name] ([id number]) [id number] Page 2 of 2 ICD-10 Codes Code Description [phone] Non-pressure chronic ulcer of other part of left foot with fat layer exposed M86.172 Other acute osteomyelitis, left ankle and foot L97.528 Non-pressure chronic ulcer of other part of left foot with other specified severity E11.621 Type 2 diabetes mellitus with foot ulcer E11.42 Type 2 diabetes mellitus with diabetic polyneuropathy S91.301A Unspecified open wound, right foot, initial encounter 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 severity Facility Procedures : CPT4 Code Description: [id number] G0277-(Facility Use Only) HBOT full body chamber, , ICD-10 Diagnosis Description E11.621 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.522 Non-pressure chronic ulcer of other part of left foot with fat lay L97.528  Non-pressure chronic ulcer of other part of left foot with other s M86.172 Other acute osteomyelitis, left ankle and foot Modifier: er exposed pecified severity Quantity: 4 Physician Procedures : CPT4 Code Description Modifier [id number] - WC PHYS HYPERBARIC OXYGEN THERAPY ICD-10 Diagnosis Description E11.621 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.528  Non-pressure chronic ulcer of other part of left foot with other specified severit M86.172 Other acute osteomyelitis, left ankle and foot Quantity: 1 y Psychologist, [facility]) Signed: 10/14/2022 1:49:06 PM By: [name] EMT Signed: 10/14/2022 5:51:12 PM By: [name] Entered By: [name] on 10/14/2022 13:49:05

## 2022-10-15 NOTE — Progress Notes (Signed)
ELIYAH, STADLER [name] ([id number]) [id number] Page 1 of 2 Visit Report for 10/15/2022 Arrival Information Details Patient Name: Date of Service: [name], [name]. 10/15/2022 8:00 [name] Medical Record Number: [id number] Patient Account Number: [id number] Date of Birth/Sex: Treating RN: 08-10-1955 (67 y.o. [name] Primary Care Keats Kingry: [name] Other Clinician: [name] Referring Jelitza Manninen: Treating Abra Lingenfelter/Extender: Nelda Bucks in Treatment: 26 Visit Information History Since Last Visit All ordered tests and consults were completed: Yes Patient Arrived: Wheel Chair Added or deleted any medications: No Arrival Time: 07:31 Any new allergies or adverse reactions: No Accompanied By: None Had [name] fall or experienced change in No Transfer Assistance: None activities of daily living that may affect Patient Identification Verified: Yes risk of falls: Secondary Verification Process Completed: Yes Signs or symptoms of abuse/neglect since last visito No Patient Requires Transmission-Based Precautions: No Hospitalized since last visit: No Patient Has Alerts: Yes Implantable device outside of the clinic excluding No cellular tissue based products placed in the center since last visit: Pain Present Now: No Electronic Signature(s) Signed: 10/15/2022 1:35:32 PM By: [name] EMT Entered By: [name] on 10/15/2022 13:35:32 -------------------------------------------------------------------------------- Encounter Discharge Information Details Patient Name: Date of Service: [name], [name] 10/15/2022 8:00 [name] Medical Record Number: [id number] Patient Account Number: [id number] Date of Birth/Sex: Treating RN: 04-30-1956 (67 y.o. [name] Primary Care Quinette Hentges: [name] Other Clinician: [name] Referring Brick Ketcher: Treating Roschelle Calandra/Extender: Nelda Bucks in  Treatment: 26 Encounter Discharge Information Items Discharge Condition: Stable Ambulatory Status: Wheelchair Discharge Destination: Other (Note Required) Accompanied By: Wife Schedule Follow-up Appointment: Yes Clinical Summary of Care: Notes The patient had [name] after his HBO treatment. The patient was pushed in [name] wheelchair to the [facility] lobby. Electronic Signature(s) Signed: 10/15/2022 1:48:00 PM By: [name] EMT Entered By: [name] on 10/15/2022 13:48:00 -------------------------------------------------------------------------------- Vitals Details Patient Name: Date of Service: [name], [name] 10/15/2022 8:00 [name] Medical Record Number: [id number] Patient Account Number: [id number] Date of Birth/Sex: Treating RN: 1956-04-19 (67 y.o. [name] Primary Care Danyiel Crespin: [name] Other Clinician: [name] Referring Sadhana Frater: Treating Takota Cahalan/Extender: Nelda Bucks in Treatment: [address] ARGIE, [name] ([id number]) [id number] Page 2 of 2 Time Taken: 07:53 Temperature (F): 98.3 Height (in): 72 Pulse (bpm): 84 Weight (lbs): 220 Respiratory Rate (breaths/min): 18 Body Mass Index (BMI): 29.8 Blood Pressure (mmHg): 158/83 Capillary Blood Glucose (mg/dl): 421 Reference Range: 80 - 120 mg / dl Notes Dr. Mikey Bussing informed of Blood sugar. Electronic Signature(s) Signed: 10/15/2022 1:39:15 PM By: [name] EMT Entered By: [name] on 10/15/2022 13:39:15

## 2022-10-16 ENCOUNTER — Encounter (HOSPITAL_BASED_OUTPATIENT_CLINIC_OR_DEPARTMENT_OTHER): Payer: Medicare HMO | Admitting: Internal Medicine

## 2022-10-16 ENCOUNTER — Ambulatory Visit (HOSPITAL_BASED_OUTPATIENT_CLINIC_OR_DEPARTMENT_OTHER): Payer: Medicare HMO | Admitting: Internal Medicine

## 2022-10-16 DIAGNOSIS — L97522 Non-pressure chronic ulcer of other part of left foot with fat layer exposed: Secondary | ICD-10-CM

## 2022-10-16 DIAGNOSIS — L97528 Non-pressure chronic ulcer of other part of left foot with other specified severity: Secondary | ICD-10-CM

## 2022-10-16 DIAGNOSIS — E11621 Type 2 diabetes mellitus with foot ulcer: Secondary | ICD-10-CM

## 2022-10-16 DIAGNOSIS — M86172 Other acute osteomyelitis, left ankle and foot: Secondary | ICD-10-CM

## 2022-10-16 LAB — GLUCOSE, CAPILLARY
Glucose-Capillary: 360 mg/dL — ABNORMAL HIGH (ref 70–99)
Glucose-Capillary: 203 mg/dL — ABNORMAL HIGH (ref 70–99)

## 2022-10-16 NOTE — Progress Notes (Signed)
JOSEMANUEL, EAKINS A ([id number]) [id number] Page 1 of 2 Visit Report for 10/16/2022 HBO Details Patient Name: Date of Service: Charlynne Cousins, [place] [name] 10/16/2022 8:00 A M Medical Record Number: [id number] Patient Account Number: [id number] Date of Birth/Sex: Treating RN: 10-Aug-1955 (67 y.o. [name], [name] Primary Care Jerzy Roepke: [name] Other Clinician: [name] Referring Galina Haddox: Treating Ellissa Ayo/Extender: Nelda Bucks in Treatment: 26 HBO Treatment Course Details Treatment Course Number: 1 Ordering Brenson Hartman: Geralyn Corwin T Treatments Ordered: otal 100 HBO Treatment Start Date: 06/01/2022 HBO Indication: Diabetic Ulcer(s) of the Lower Extremity Standard/Conservative Wound Care tried and failed greater than or equal to 30 days HBO Treatment Details Treatment Number: 83 Patient Type: Outpatient Chamber Type: Monoplace Chamber Serial #: [id number] Treatment Protocol: 2.5 ATA with 90 minutes oxygen, with two 5 minute air breaks Treatment Details Compression Rate Down: 2.0 psi / minute De-Compression Rate Up: 2.0 psi / minute A breaks and breathing Decompress Decompress ir Compress Tx Pressure periods Begins Reached Begins Ends (leave unused spaces blank) Chamber Pressure (ATA 1 2.5 2.5 2.5 2.5 2.5 - - 2.5 1 ) Clock Time (24 hr) 08:08 08:20 08:20 08:50 08:55 09:25 09:30 - 10:00 10:13 Treatment Length: 125 (minutes) Treatment Segments: 4 Vital Signs Capillary Blood Glucose Reference Range: 80 - 120 mg / dl HBO Diabetic Blood Glucose Intervention Range: <131 mg/dl or >782 mg/dl Time Vitals Blood Respiratory Capillary Blood Glucose Pulse Action Type: Pulse: Temperature: Taken: Pressure: Rate: Glucose (mg/dl): Meter #: Oximetry (%) Taken: Pre 07:39 145/78 75 20 97.4 360 Post 10:18 169/89 70 18 97.2 203 Treatment Response Treatment Toleration: Well Treatment Completion Status: Treatment Completed without  Adverse Event Additional Procedure Documentation Tissue Sevierity: Necrosis of bone Physician HBO Attestation: I certify that I supervised this HBO treatment in accordance with Medicare guidelines. A trained emergency response team is readily available per Yes hospital policies and procedures. Continue HBOT as ordered. Yes Electronic Signature(s) Signed: 10/19/2022 11:42:23 AM By: Geralyn Corwin DO Previous Signature: 10/16/2022 11:02:39 AM Version By: [name] EMT Entered By: Geralyn Corwin on 10/19/2022 09:08:07 -------------------------------------------------------------------------------- HBO Safety Checklist Details Patient Name: Date of Service: Charlynne Cousins, [name] 10/16/2022 8:00 A M Medical Record Number: [id number] Patient Account Number: [id number] Date of Birth/Sex: Treating RN: 1955/11/25 (67 y.o. [name], [name] Primary Care Jesper Stirewalt: [name] Other Clinician: [name] Referring Tynesia Harral: Treating Jericho Alcorn/Extender: Mansoor, Hillyard A ([id number]) [id number] Page 2 of 2 Weeks in Treatment: 26 HBO Safety Checklist Items Safety Checklist Consent Form Signed Patient voided / foley secured and emptied When did you last eato 0700 Last dose of injectable or oral agent 0625 Ostomy pouch emptied and vented if applicable NA All implantable devices assessed, documented and approved NA Intravenous access site secured and place NA Valuables secured Linens and cotton and cotton/polyester blend (less than 51% polyester) Personal oil-based products / skin lotions / body lotions removed Wigs or hairpieces removed NA Smoking or tobacco materials removed Books / newspapers / magazines / loose paper removed Cologne, aftershave, perfume and deodorant removed Jewelry removed (may wrap wedding band) Make-up removed NA Hair care products removed Battery operated devices (external) removed Heating  patches and chemical warmers removed Titanium eyewear removed NA Nail polish cured greater than 10 hours NA Casting material cured greater than 10 hours NA Hearing aids removed NA Loose dentures or partials removed removed by patient Prosthetics have been removed NA Patient demonstrates correct use of air break device (if applicable) Patient concerns  have been addressed Patient grounding bracelet on and cord attached to chamber Specifics for Inpatients (complete in addition to above) Medication sheet sent with patient NA Intravenous medications needed or due during therapy sent with patient NA Drainage tubes (e.g. nasogastric tube or chest tube secured and vented) NA Endotracheal or Tracheotomy tube secured NA Cuff deflated of air and inflated with saline NA Airway suctioned NA Notes The safety checklist was done before the treatment was started. Electronic Signature(s) Signed: 10/16/2022 11:01:22 AM By: [name] EMT Entered By: [name] on 10/16/2022 11:01:22

## 2022-10-16 NOTE — Progress Notes (Signed)
ERSKINE, RUDOW A ([id number]) [id number] Page 1 of 2 Visit Report for 10/16/2022 Arrival Information Details Patient Name: Date of Service: [name], [name] 10/16/2022 8:00 A M Medical Record Number: [id number] Patient Account Number: [id number] Date of Birth/Sex: Treating RN: 1956/01/26 (67 y.o. [name], [name] Primary Care Skye Rodarte: [name] Other Clinician: [name] Referring Kaidan Spengler: Treating Arin Vanosdol/Extender: Nelda Bucks in Treatment: 26 Visit Information History Since Last Visit All ordered tests and consults were completed: Yes Patient Arrived: Wheel Chair Added or deleted any medications: No Arrival Time: 07:31 Any new allergies or adverse reactions: No Accompanied By: None Had a fall or experienced change in No Transfer Assistance: None activities of daily living that may affect Patient Identification Verified: Yes risk of falls: Secondary Verification Process Completed: Yes Signs or symptoms of abuse/neglect since last visito No Patient Requires Transmission-Based Precautions: No Hospitalized since last visit: No Patient Has Alerts: Yes Implantable device outside of the clinic excluding No cellular tissue based products placed in the center since last visit: Pain Present Now: No Electronic Signature(s) Signed: 10/16/2022 10:59:29 AM By: [name] EMT Entered By: [name] on 10/16/2022 10:59:28 -------------------------------------------------------------------------------- Encounter Discharge Information Details Patient Name: Date of Service: [name], [name] 10/16/2022 8:00 A M Medical Record Number: [id number] Patient Account Number: [id number] Date of Birth/Sex: Treating RN: [birth year] (67 y.o. [name], [name] Primary Care Jaymarion Trombly: [name] Other Clinician: [name] Referring Rupinder Livingston: Treating Payson Evrard/Extender: Nelda Bucks in  Treatment: 26 Encounter Discharge Information Items Discharge Condition: Stable Ambulatory Status: Wheelchair Discharge Destination: Home Transportation: Private Auto Accompanied By: None Schedule Follow-up Appointment: Yes Clinical Summary of Care: Electronic Signature(s) Signed: 10/16/2022 11:04:20 AM By: [name] EMT Entered By: [name] on 10/16/2022 11:04:20 -------------------------------------------------------------------------------- Vitals Details Patient Name: Date of Service: [name], [name] 10/16/2022 8:00 A M Medical Record Number: [id number] Patient Account Number: [id number] Date of Birth/Sex: Treating RN: [birth year] (67 y.o. [name], [name] Primary Care Valene Villa: [name] Other Clinician: [name] Referring Shakeria Robinette: Treating Myriam Brandhorst/Extender: Mariam Dollar, [name] in Treatment: 26 Vital Signs Time Taken: 07:39 Temperature (F): 97.4 HAZIEL, ORZECH A ([id number]) [id number] Page 2 of 2 Height (in): 72 Pulse (bpm): 75 Weight (lbs): 220 Respiratory Rate (breaths/min): 20 Body Mass Index (BMI): 29.8 Blood Pressure (mmHg): 145/78 Capillary Blood Glucose (mg/dl): 081 Reference Range: 80 - 120 mg / dl Notes Dr. Mikey Bussing informed of Blood sugar. Electronic Signature(s) Signed: 10/16/2022 11:00:11 AM By: [name] EMT Entered By: [name] on 10/16/2022 11:00:11

## 2022-10-19 ENCOUNTER — Encounter (HOSPITAL_BASED_OUTPATIENT_CLINIC_OR_DEPARTMENT_OTHER): Payer: Medicare HMO | Admitting: Internal Medicine

## 2022-10-19 DIAGNOSIS — L97528 Non-pressure chronic ulcer of other part of left foot with other specified severity: Secondary | ICD-10-CM | POA: Diagnosis not present

## 2022-10-19 DIAGNOSIS — L97522 Non-pressure chronic ulcer of other part of left foot with fat layer exposed: Secondary | ICD-10-CM

## 2022-10-19 DIAGNOSIS — E11621 Type 2 diabetes mellitus with foot ulcer: Secondary | ICD-10-CM

## 2022-10-19 DIAGNOSIS — M86172 Other acute osteomyelitis, left ankle and foot: Secondary | ICD-10-CM

## 2022-10-19 LAB — GLUCOSE, CAPILLARY
Glucose-Capillary: 286 mg/dL — ABNORMAL HIGH (ref 70–99)
Glucose-Capillary: 88 mg/dL (ref 70–99)

## 2022-10-19 NOTE — Progress Notes (Signed)
TARIK, VANNOTE A ([id number]) [id number] Page 1 of 1 Visit Report for 10/19/2022 SuperBill Details Patient Name: Date of Service: [name], [name] 10/19/2022 Medical Record Number: [id number] Patient Account Number: [id number] Date of Birth/Sex: Treating RN: 10-06-1955 (67 y.o. Damaris Schooner Primary Care Provider: [name] Other Clinician: Haywood Pao Referring Provider: Treating Provider/Extender: Nelda Bucks in Treatment: 26 Diagnosis Coding ICD-10 Codes Code Description [phone] Non-pressure chronic ulcer of other part of left foot with fat layer exposed M86.172 Other acute osteomyelitis, left ankle and foot L97.528 Non-pressure chronic ulcer of other part of left foot with other specified severity E11.621 Type 2 diabetes mellitus with foot ulcer E11.42 Type 2 diabetes mellitus with diabetic polyneuropathy S91.301A Unspecified open wound, right foot, initial encounter 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 severity Facility Procedures CPT4 Code Description Modifier Quantity [id number] G0277-(Facility Use Only) HBOT full body chamber, , 4 ICD-10 Diagnosis Description E11.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 M86.172 Other acute osteomyelitis, left ankle and foot Physician Procedures Quantity CPT4 Code Description Modifier [id number] - WC PHYS HYPERBARIC OXYGEN THERAPY 1 ICD-10 Diagnosis Description E11.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

## 2022-10-19 NOTE — Progress Notes (Addendum)
FREAD, ACKROYD A ([id number]) [id number] Page 1 of 2 Visit Report for 10/19/2022 Arrival Information Details Patient Name: Date of Service: [name], [place] [name] 10/19/2022 8:00 A M Medical Record Number: [id number] Patient Account Number: [id number] Date of Birth/Sex: Treating RN: 24-Jun-1956 (67 y.o. Damaris Schooner Primary Care Erla Bacchi: [name] Other Clinician: Haywood Pao Referring Doria Fern: Treating Ramina Hulet/Extender: Nelda Bucks in Treatment: 26 Visit Information History Since Last Visit All ordered tests and consults were completed: Yes Patient Arrived: Wheel Chair Added or deleted any medications: No Arrival Time: 07:26 Any new allergies or adverse reactions: No Accompanied By: self Had a fall or experienced change in No Transfer Assistance: None activities of daily living that may affect Patient Identification Verified: Yes risk of falls: Secondary Verification Process Completed: Yes Signs or symptoms of abuse/neglect since last visito No Patient Requires Transmission-Based Precautions: No Hospitalized since last visit: No Patient Has Alerts: Yes Implantable device outside of the clinic excluding No cellular tissue based products placed in the center since last visit: Pain Present Now: No Electronic Signature(s) Signed: 10/19/2022 8:39:16 AM By: Haywood Pao CHT EMT BS , , Entered By: Haywood Pao on 10/19/2022 08:39:16 -------------------------------------------------------------------------------- Encounter Discharge Information Details Patient Name: Date of Service: [name], [name] 10/19/2022 8:00 A M Medical Record Number: [id number] Patient Account Number: [id number] Date of Birth/Sex: Treating RN: Nov 03, 1955 (67 y.o. Damaris Schooner Primary Care Meldon Hanzlik: [name] Other Clinician: Haywood Pao Referring Hommer Cunliffe: Treating Blayton Huttner/Extender: Nelda Bucks in Treatment: 26 Encounter Discharge Information Items Discharge Condition: Stable Ambulatory Status: Wheelchair Discharge Destination: Home Transportation: Private Auto Accompanied By: self Schedule Follow-up Appointment: No Clinical Summary of Care: Electronic Signature(s) Signed: 10/19/2022 11:22:30 AM By: Haywood Pao CHT EMT BS , , Entered By: Haywood Pao on 10/19/2022 11:22:30 -------------------------------------------------------------------------------- Vitals Details Patient Name: Date of Service: [name], [name] 10/19/2022 8:00 A M Medical Record Number: [id number] Patient Account Number: [id number] Date of Birth/Sex: Treating RN: 1955/08/30 (67 y.o. Damaris Schooner Primary Care Schwanda Zima: [name] Other Clinician: Haywood Pao Referring Quenisha Lovins: Treating Jood Retana/Extender: Mariam Dollar, [name] in Treatment: 26 Vital Signs Time Taken: 08:03 Temperature (F): 98.2 KASHDYN, WESTMEYER A ([id number]) [id number] Page 2 of 2 Height (in): 72 Pulse (bpm): 93 Weight (lbs): 220 Respiratory Rate (breaths/min): 18 Body Mass Index (BMI): 29.8 Blood Pressure (mmHg): 160/89 Capillary Blood Glucose (mg/dl): 314 Reference Range: 80 - 120 mg / dl Electronic Signature(s) Signed: 10/19/2022 8:39:43 AM By: Haywood Pao CHT EMT BS , , Entered By: Haywood Pao on 10/19/2022 08:39:43

## 2022-10-19 NOTE — Progress Notes (Signed)
PHINNEAS, MOGA A ([id number]) [id number] Page 1 of 2 Visit Report for 10/16/2022 Problem List Details Patient Name: Date of Service: [name], [place] [name] 10/16/2022 8:00 A M Medical Record Number: [id number] Patient Account Number: [id number] Date of Birth/Sex: Treating RN: Jun 28, 1956 (67 y.o. [name], [name] Primary Care Provider: [name] Other Clinician: [name] Referring Provider: Treating Provider/Extender: Nelda Bucks in Treatment: 26 Active Problems ICD-10 Encounter Code Description Active Date MDM Diagnosis [phone] Non-pressure chronic ulcer of other part of left foot with fat layer 04/14/2022 No Yes exposed M86.172 Other acute osteomyelitis, left ankle and foot 05/08/2022 No Yes L97.528 Non-pressure chronic ulcer of other part of left foot with other 04/14/2022 No Yes specified severity E11.621 Type 2 diabetes mellitus with foot ulcer 04/14/2022 No Yes E11.42 Type 2 diabetes mellitus with diabetic polyneuropathy 04/14/2022 No Yes S91.301A Unspecified open wound, right foot, initial encounter 07/16/2022 No Yes 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 07/16/2022 No Yes specified severity Inactive Problems Resolved Problems Electronic Signature(s) Signed: 10/16/2022 11:03:28 AM By: [name] EMT Signed: 10/19/2022 11:42:23 AM By: Geralyn Corwin DO Entered By: [name] on 10/16/2022 11:03:28 -------------------------------------------------------------------------------- SuperBill Details Patient Name: Date of Service: [name], [name] 10/16/2022 Medical Record Number: [id number] Patient Account Number: [id number] Date of Birth/Sex: Treating RN: 23-Nov-1955 (67 y.o. [name], [name] Primary Care Provider: [name] Other Clinician: [name] Referring Provider: Treating Provider/Extender: Nelda Bucks in Treatment:  [address] TEVEN, HEILEMAN A ([id number]) [id number] Page 2 of 2 ICD-10 Codes Code Description [phone] Non-pressure chronic ulcer of other part of left foot with fat layer exposed M86.172 Other acute osteomyelitis, left ankle and foot L97.528 Non-pressure chronic ulcer of other part of left foot with other specified severity E11.621 Type 2 diabetes mellitus with foot ulcer E11.42 Type 2 diabetes mellitus with diabetic polyneuropathy S91.301A Unspecified open wound, right foot, initial encounter 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 severity Facility Procedures : CPT4 Code Description: [id number] G0277-(Facility Use Only) HBOT full body chamber, , ICD-10 Diagnosis Description E11.621 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.522 Non-pressure chronic ulcer of other part of left foot with fat lay L97.528  Non-pressure chronic ulcer of other part of left foot with other s M86.172 Other acute osteomyelitis, left ankle and foot Modifier: er exposed pecified severity Quantity: 4 Physician Procedures : CPT4 Code Description Modifier [id number] [phone] - WC PHYS HYPERBARIC OXYGEN THERAPY ICD-10 Diagnosis Description E11.621 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.528  Non-pressure chronic ulcer of other part of left foot with other specified severit M86.172 Other acute osteomyelitis, left ankle and foot Quantity: 1 y Electronic Signature(s) Signed: 10/16/2022 11:03:16 AM By: [name] EMT Signed: 10/19/2022 11:42:23 AM By: Geralyn Corwin DO Entered By: [name] on 10/16/2022 11:03:15

## 2022-10-19 NOTE — Progress Notes (Addendum)
[name] ([id number]) [id number] Page 1 of 2 Visit Report for 10/19/2022 HBO Details Patient Name: Date of Service: [name], [name]. 10/19/2022 8:00 [name] Medical Record Number: [id number] Patient Account Number: [id number] Date of Birth/Sex: Treating RN: [birth year] (67 y.o. Damaris Schooner Primary Care Milon Dethloff: [name] Other Clinician: Haywood Pao Referring Koa Zoeller: Treating Atiba Kimberlin/Extender: Nelda Bucks in Treatment: 26 HBO Treatment Course Details Treatment Course Number: 1 Ordering Annaleise Burger: Geralyn Corwin T Treatments Ordered: otal 100 HBO Treatment Start Date: 06/01/2022 HBO Indication: Diabetic Ulcer(s) of the Lower Extremity Standard/Conservative Wound Care tried and failed greater than or equal to 30 days HBO Treatment Details Treatment Number: 84 Patient Type: Outpatient Chamber Type: Monoplace Chamber Serial #: [id number] Treatment Protocol: 2.5 ATA with 90 minutes oxygen, with two 5 minute air breaks Treatment Details Compression Rate Down: 1.5 psi / minute De-Compression Rate Up: 2.0 psi / minute [name] breaks and breathing ir Compress Tx Pressure periods Decompress Decompress Begins Reached (leave unused spaces Begins Ends blank) Chamber Pressure (ATA 1 2.5 2.5 2.5 2.5 2.5 - - 2.5 1 ) Clock Time (24 hr) 08:05 08:19 08:49 08:54 09:24 09:29 - - 09:59 10:09 Treatment Length: 124 (minutes) Treatment Segments: 4 Vital Signs Capillary Blood Glucose Reference Range: 80 - 120 mg / dl HBO Diabetic Blood Glucose Intervention Range: <131 mg/dl or >244 mg/dl Type: Time Vitals Blood Pulse: Respiratory Temperature: Capillary Blood Glucose Pulse Action Taken: Pressure: Rate: Glucose (mg/dl): Meter #: Oximetry (%) Taken: Pre 08:03 160/89 93 18 98.2 286 patient asymptomatic for hyperglycemia Post 10:11 157/95 77 18 97.3 88 Given 8 oz Glucerna per protocol. Treatment Response Treatment Toleration:  Well Treatment Completion Status: Treatment Completed without Adverse Event Treatment Notes [name] arrived with [name] 286 mg/dL blood glucose level. Patient stated he had some thirst but otherwise felt okay. After performing [name] safety check, patient was placed in the chamber which was compressed with 100% oxygen at [name] rate of 2 psi/min after confirming normal ear equalization. He tolerated the treatment and the subsequent decompression at [name] rate of 2 psi/min. He denied any issues with ear equalization and/or pain. Post-treatment blood glucose level was 88 mg/dL. He was given an 8 oz Glucerna and, per protocol, after 15 minutes he was asymptomatic for hypoglycemia. He was stable upon discharge. Additional Procedure Documentation Tissue Sevierity: Necrosis of bone Physician HBO Attestation: I certify that I supervised this HBO treatment in accordance with Medicare guidelines. [name] trained emergency response team is readily available per Yes hospital policies and procedures. Continue HBOT as ordered. Yes Electronic Signature(s) Signed: 10/19/2022 1:47:27 PM By: Geralyn Corwin DO Previous Signature: 10/19/2022 11:21:39 AM Version By: Haywood Pao CHT EMT BS , , Previous Signature: 10/19/2022 11:21:15 AM Version By: Haywood Pao CHT EMT BS , , Entered By: Geralyn Corwin on 10/19/2022 13:46:49 [name] ([id number]) [id number] Page 2 of 2 -------------------------------------------------------------------------------- HBO Safety Checklist Details Patient Name: Date of Service: [name], [name]. 10/19/2022 8:00 [name] Medical Record Number: [id number] Patient Account Number: [id number] Date of Birth/Sex: Treating RN: [birth year] (67 y.o. Damaris Schooner Primary Care Shatyra Becka: [name] Other Clinician: Haywood Pao Referring Abir Craine: Treating Orlen Leedy/Extender: Nelda Bucks in Treatment: 26 HBO Safety Checklist  Items Safety Checklist Consent Form Signed Patient voided / foley secured and emptied When did you last eato 0630 Last dose of injectable or oral agent 0620 Ostomy pouch emptied and vented if applicable NA All implantable devices assessed,  documented and approved NA Intravenous access site secured and place NA Valuables secured Linens and cotton and cotton/polyester blend (less than 51% polyester) Personal oil-based products / skin lotions / body lotions removed Wigs or hairpieces removed NA Smoking or tobacco materials removed NA Books / newspapers / magazines / loose paper removed Cologne, aftershave, perfume and deodorant removed Jewelry removed (may wrap wedding band) Make-up removed NA Hair care products removed Battery operated devices (external) removed Heating patches and chemical warmers removed Titanium eyewear removed Nail polish cured greater than 10 hours NA Casting material cured greater than 10 hours NA Hearing aids removed NA Loose dentures or partials removed dentures removed Prosthetics have been removed NA Patient demonstrates correct use of air break device (if applicable) Patient concerns have been addressed Patient grounding bracelet on and cord attached to chamber Specifics for Inpatients (complete in addition to above) Medication sheet sent with patient NA Intravenous medications needed or due during therapy sent with patient NA Drainage tubes (e.g. nasogastric tube or chest tube secured and vented) NA Endotracheal or Tracheotomy tube secured NA Cuff deflated of air and inflated with saline NA Airway suctioned NA Notes Paper version used prior to treatment start. Electronic Signature(s) Signed: 10/19/2022 8:41:03 AM By: Haywood Pao CHT EMT BS , , Entered By: Haywood Pao on 10/19/2022 08:41:02

## 2022-10-20 ENCOUNTER — Encounter (HOSPITAL_BASED_OUTPATIENT_CLINIC_OR_DEPARTMENT_OTHER): Payer: Medicare HMO | Admitting: Internal Medicine

## 2022-10-21 ENCOUNTER — Encounter (HOSPITAL_BASED_OUTPATIENT_CLINIC_OR_DEPARTMENT_OTHER): Payer: Medicare HMO | Admitting: Physician Assistant

## 2022-10-21 ENCOUNTER — Ambulatory Visit: Payer: Medicare HMO | Admitting: Podiatry

## 2022-10-21 DIAGNOSIS — Z89431 Acquired absence of right foot: Secondary | ICD-10-CM

## 2022-10-21 DIAGNOSIS — Z89422 Acquired absence of other left toe(s): Secondary | ICD-10-CM | POA: Diagnosis not present

## 2022-10-21 DIAGNOSIS — E1142 Type 2 diabetes mellitus with diabetic polyneuropathy: Secondary | ICD-10-CM | POA: Diagnosis not present

## 2022-10-21 DIAGNOSIS — E11621 Type 2 diabetes mellitus with foot ulcer: Secondary | ICD-10-CM | POA: Diagnosis not present

## 2022-10-21 LAB — GLUCOSE, CAPILLARY
Glucose-Capillary: 331 mg/dL — ABNORMAL HIGH (ref 70–99)
Glucose-Capillary: 198 mg/dL — ABNORMAL HIGH (ref 70–99)

## 2022-10-21 NOTE — Progress Notes (Unsigned)
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 male returns for post-op check.  Patient states he is doing okay.  He is being followed at the wound care center who is managing the wound.  He is now concerned with the right transmetatarsal amputation wound site.  He has not seen the wound care center yet for this  Review of Systems: Negative except as noted in the HPI. Denies N/V/F/Ch.  Past Medical History:  Diagnosis Date   Diabetes mellitus with peripheral vascular disease    Hypertension    Neuropathy    PTSD (post-traumatic stress disorder)    Sleep apnea     Current Outpatient Medications:    albuterol (VENTOLIN HFA) 108 (90 Base) MCG/ACT inhaler, Inhale 2 puffs into the lungs every 6 (six) hours as needed for wheezing or shortness of breath., Disp: 8 g, Rfl: 0   amLODipine (NORVASC) 5 MG tablet, Take 1 tablet (5 mg total) by mouth every morning., Disp: 30 tablet, Rfl: 0   atorvastatin (LIPITOR) 80 MG tablet, Take 1 tablet (80 mg total) by mouth at bedtime., Disp: 30 tablet, Rfl: 0   benzonatate (TESSALON) 100 MG capsule, Take 1 capsule (100 mg total) by mouth every 8 (eight) hours., Disp: 28 capsule, Rfl: 0   buPROPion (WELLBUTRIN XL) 300 MG 24 hr tablet, Take 1 tablet (300 mg total) by mouth every morning., Disp: 30 tablet, Rfl: 0   carboxymethylcellulose (REFRESH PLUS) 0.5 % SOLN, Place 2 drops into both eyes in the morning, at noon, in the evening, and at bedtime., Disp: , Rfl:    cholecalciferol (VITAMIN D) 25 MCG (1000 UNIT) tablet, Take 1,000 Units by mouth daily., Disp: , Rfl:    clopidogrel (PLAVIX) 75 MG tablet, Take 1 tablet (75 mg total) by mouth daily., Disp: 30 tablet, Rfl: 11   empagliflozin (JARDIANCE) 25 MG TABS tablet, Take 1 tablet (25 mg total)  by mouth daily., Disp: 30 tablet, Rfl: 0   fluticasone furoate-vilanterol (BREO ELLIPTA) 100-25 MCG/INH AEPB, Inhale 1 puff into the lungs daily., Disp: 60 each, Rfl: 5   gabapentin (NEURONTIN) 300 MG capsule, Take 600 mg by mouth 2 (two) times daily as needed (pain)., Disp: , Rfl:    insulin aspart (NOVOLOG) 100 UNIT/ML injection, CBG 70 - 120: 0 units CBG 121 - 150: 1 unit CBG 151 - 200: 2 units CBG 201 - 250: 3 units CBG 251 - 300: 5 units CBG 301 - 350: 7 units CBG 351 - 400: 9 units, Disp: 10 mL, Rfl: 11   insulin aspart protamine - aspart (NOVOLOG 70/30 MIX) (70-30) 100 UNIT/ML FlexPen, Inject 40-80 Units into the skin See admin instructions. Take 80 units in the morning and 40 units at bedtime, Disp: , Rfl:    insulin glargine-yfgn (SEMGLEE) 100 UNIT/ML injection, Inject 0.2 mLs (20 Units total) into the skin 2 (two) times daily., Disp: 10 mL, Rfl: 11   lisinopril (ZESTRIL) 20 MG tablet, Take 20 mg by mouth daily., Disp: , Rfl:    lurasidone (LATUDA) 40 MG TABS tablet, Take 1 tablet (40 mg total) by mouth daily with supper., Disp: 30 tablet, Rfl: 0   meclizine (ANTIVERT) 25 MG tablet, Take 1 tablet (25 mg total) by mouth 3 (three) times  daily as needed for dizziness., Disp: 30 tablet, Rfl: 0   metoprolol tartrate (LOPRESSOR) 50 MG tablet, Take 1 tablet (50 mg total) by mouth 2 (two) times daily., Disp: 60 tablet, Rfl: 0   OLANZapine (ZYPREXA) 2.5 MG tablet, Take 1 tablet (2.5 mg total) by mouth at bedtime., Disp: 30 tablet, Rfl: 0   omeprazole (PRILOSEC) 20 MG capsule, Take 1 capsule (20 mg total) by mouth at bedtime., Disp: 30 capsule, Rfl: 0   OXcarbazepine (TRILEPTAL) 150 MG tablet, Take 1 tablet (150 mg total) by mouth 2 (two) times daily., Disp: 60 tablet, Rfl: 0   oxyCODONE (ROXICODONE) 15 MG immediate release tablet, Take 15 mg by mouth every 6 (six) hours as needed (pain)., Disp: , Rfl:    senna-docusate (SENOKOT-S) 8.6-50 MG tablet, Take 1 tablet by mouth at bedtime as needed for mild  constipation., Disp: , Rfl:   Social History   Tobacco Use  Smoking Status Former   Types: Cigarettes   Passive exposure: Never  Smokeless Tobacco Never    Allergies  Allergen Reactions   Gemfibrozil Other (See Comments)    Doesn't know its been awhile   Oxycontin [Oxycodone Hcl] Other (See Comments)    Bad reaction "took me to a place I never wanna go again"   Pork-Derived Products    Simvastatin Other (See Comments)    Says he doesn't know its been awhile   Objective:  There were no vitals filed for this visit. There is no height or weight on file to calculate BMI. Constitutional Well developed. Well nourished.  Vascular Foot warm and well perfused. Capillary refill normal to all digits.   Neurologic Normal speech. Oriented to person, place, and time. Epicritic sensation to light touch grossly present bilaterally.  Dermatologic Left partial fifth ray wound healing well.  Granular wound bed noted no exposure of bone noted. No malodor.  No purulent drainage noted.  Right transmetatarsal wound dehiscence noted limited to the breakdown of the skin.  No infection noted.  Orthopedic: No further tenderness to palpation noted about the surgical site.   Radiographs: None Assessment:   No diagnosis found.     Plan:  Patient was evaluated and treated and all questions answered.  Right transmetatarsal wound superficial dehiscence -Continue Betadine wet-to-dry dressing.  I encouraged the patient to have the wound care center take a look at it and help with healing.  Patient states understanding.  Left lateral foot wound -Clinically healing well.  Wound care center is primarily managing this wound. Looking same.  Referred to the wound care center.  Continue Betadine wet-to-dry Right superficial TMA dehiscence will have the wound care center take a look at it  Left side healing well.

## 2022-10-22 ENCOUNTER — Encounter (HOSPITAL_BASED_OUTPATIENT_CLINIC_OR_DEPARTMENT_OTHER): Payer: Medicare HMO | Admitting: Internal Medicine

## 2022-10-22 NOTE — Progress Notes (Signed)
TUPAC, JEFFUS [name] ([id number]) [id number] Page 1 of 14 Visit Report for 09/24/2022 Chief Complaint Document Details Patient Name: Date of Service: [name], [place] [name]. 09/24/2022 10:30 [name] Medical Record Number: [id number] Patient Account Number: [id number] Date of Birth/Sex: Treating RN: 1956/05/03 (67 y.o. M) Primary Care Provider: Tama [name] Other Clinician: Referring Provider: Treating Provider/Extender: [name], [name] in Treatment: 23 Information Obtained from: Patient Chief Complaint 10/16/20; patient is here with [name] substantial wound on his right foot in the setting of [name] recent transmetatarsal amputation. 04/14/2022; referral from podiatry for postsurgical wound of nonhealing partial left fifth ray amputation, right foot (07/16/22): TMA dehiscence Electronic Signature(s) Signed: 09/24/2022 1:29:58 PM By: Geralyn Corwin DO Entered By: Geralyn Corwin on 09/24/2022 11:38:03 -------------------------------------------------------------------------------- Cellular or Tissue Based Product Details Patient Name: Date of Service: [name], [name]. 09/24/2022 10:30 [name] Medical Record Number: [id number] Patient Account Number: [id number] Date of Birth/Sex: Treating RN: [birth year] (67 y.o. [name] Primary Care Provider: Tama [name] Other Clinician: Referring Provider: Treating Provider/Extender: Nelda Bucks in Treatment: 23 Cellular or Tissue Based Product Type Wound #6 Right Amputation Site - Transmetatarsal Applied to: Performed By: Physician Geralyn Corwin, DO Cellular or Tissue Based Product Type: Grafix prime Level of Consciousness (Pre-procedure): Awake and Alert Pre-procedure Verification/Time Out Yes - 11:15 Taken: Location: genitalia / hands / feet / multiple digits Wound Size (sq cm): 0.04 Product Size (sq cm): 1 Waste Size (sq cm): 1 Waste Reason: wound size Amount of Product  Applied (sq cm): 0 Instrument Used: Forceps, Scissors Lot #: [id number] Order #: 6 Expiration Date: 04/18/2023 Fenestrated: No Reconstituted: Yes Solution Type: normal saline Solution Amount: 5ml Lot #: [id number] Solution Expiration Date: 12/03/2024 Secured: Yes Secured With: Steri-Strips, Adaptic Dressing Applied: Yes Primary Dressing: cutimed sorbact lightly packed over grafix. Procedural Pain: 0 Post Procedural Pain: 0 Response to Treatment: Procedure was tolerated well Level of Consciousness (Post- Awake and Alert procedure): Post Procedure Diagnosis Same as Pre-procedure Notes [name], [name] ([id number]) [phone].pdf Page 2 of 14 Scribed for Dr. Mikey Bussing by Brenton Grills RN. Electronic Signature(s) Signed: 09/24/2022 1:29:58 PM By: Geralyn Corwin DO Signed: 10/22/2022 1:55:51 PM By: Brenton Grills Entered By: Brenton Grills on 09/24/2022 11:20:32 -------------------------------------------------------------------------------- Cellular or Tissue Based Product Details Patient Name: Date of Service: [name], [name]. 09/24/2022 10:30 [name] Medical Record Number: [id number] Patient Account Number: [id number] Date of Birth/Sex: Treating RN: 01/23/1956 (67 y.o. [name] Primary Care Provider: Tama [name] Other Clinician: Referring Provider: Treating Provider/Extender: Nelda Bucks in Treatment: 23 Cellular or Tissue Based Product Type Wound #5 Left,Lateral Foot Applied to: Performed By: Physician Geralyn Corwin, DO Cellular or Tissue Based Product Type: Grafix prime Level of Consciousness (Pre-procedure): Awake and Alert Pre-procedure Verification/Time Out Yes - 11:15 Taken: Location: genitalia / hands / feet / multiple digits Wound Size (sq cm): 0.48 Product Size (sq cm): 1 Waste Size (sq cm): 0 Amount of Product Applied (sq cm): 1 Instrument Used: Forceps, Scissors Lot #: [id number] Order #:  [id number] Expiration Date: 04/18/2023 Fenestrated: No Reconstituted: Yes Solution Type: normal saline Solution Amount: 5ml Lot #: [id number] Solution Expiration Date: 12/03/2024 Secured: Yes Secured With: Steri-Strips, Adaptic Dressing Applied: No Procedural Pain: 0 Post Procedural Pain: 0 Response to Treatment: Procedure was tolerated well Level of Consciousness (Post- Awake and Alert procedure): Post Procedure Diagnosis Same as Pre-procedure Electronic Signature(s) Signed: 09/24/2022 1:29:58 PM By: Geralyn Corwin DO Signed: 10/22/2022 1:55:51  PM By: Brenton Grills Entered By: Brenton Grills on 09/24/2022 11:22:12 -------------------------------------------------------------------------------- Debridement Details Patient Name: Date of Service: [name], [name]. 09/24/2022 10:30 [name] Medical Record Number: [id number] Patient Account Number: [id number] Date of Birth/Sex: Treating RN: Oct 23, 1955 (67 y.o. [name] Primary Care Provider: Tama [name] Other Clinician: Referring Provider: Treating Provider/Extender: Nelda Bucks in Treatment: 23 Debridement Performed for Assessment: Wound #5 Left,Lateral Foot Performed By: Physician Geralyn Corwin, DO Debridement Type: Debridement Severity of Tissue Pre Debridement: Fat layer exposed Level of Consciousness (Pre-procedure): Awake and Alert Pre-procedure Verification/Time Out Yes - 11:05 Taken: [name], [name] ([id number]) [phone].pdf Page 3 of 14 Start Time: 11:06 Pain Control: Lidocaine 4% T opical Solution T Area Debrided (L x W): otal 2.4 (cm) x 0.2 (cm) = 0.48 (cm) Tissue and other material debrided: Callus, Slough, Slough Level: Non-Viable Tissue Debridement Description: Selective/Open Wound Instrument: Curette Bleeding: Minimum Hemostasis Achieved: Pressure End Time: 11:06 Procedural Pain: 0 Post Procedural Pain: 0 Response to Treatment: Procedure was  tolerated well Level of Consciousness (Post- Awake and Alert procedure): Post Debridement Measurements of Total Wound Length: (cm) 2.4 Width: (cm) 0.2 Depth: (cm) 0.3 Volume: (cm) 0.113 Character of Wound/Ulcer Post Debridement: Stable Severity of Tissue Post Debridement: Fat layer exposed Post Procedure Diagnosis Same as Pre-procedure Notes Scribed on behalf of Dr. Mikey Bussing by Brenton Grills RN. Electronic Signature(s) Signed: 09/24/2022 1:29:58 PM By: Geralyn Corwin DO Signed: 10/22/2022 1:55:51 PM By: Brenton Grills Entered By: Brenton Grills on 09/24/2022 11:11:21 -------------------------------------------------------------------------------- Debridement Details Patient Name: Date of Service: [name], [name]. 09/24/2022 10:30 [name] Medical Record Number: [id number] Patient Account Number: [id number] Date of Birth/Sex: Treating RN: [birth year] (67 y.o. [name] Primary Care Provider: Tama [name] Other Clinician: Referring Provider: Treating Provider/Extender: Nelda Bucks in Treatment: 23 Debridement Performed for Assessment: Wound #6 Right Amputation Site - Transmetatarsal Performed By: Physician Geralyn Corwin, DO Debridement Type: Debridement Severity of Tissue Pre Debridement: Fat layer exposed Level of Consciousness (Pre-procedure): Awake and Alert Pre-procedure Verification/Time Out Yes - 11:05 Taken: Start Time: 11:06 Pain Control: Lidocaine 4% T opical Solution T Area Debrided (L x W): otal 1 (cm) x 1 (cm) = 1 (cm) Tissue and other material debrided: Viable, Non-Viable, Callus, Slough, Skin: Dermis , Skin: Epidermis, Slough Level: Skin/Epidermis Debridement Description: Selective/Open Wound Instrument: Curette Bleeding: Minimum Hemostasis Achieved: Pressure End Time: 11:06 Procedural Pain: 0 Post Procedural Pain: 0 Response to Treatment: Procedure was tolerated well Level of Consciousness (Post- Awake and  Alert procedure): Post Debridement Measurements of Total Wound Length: (cm) 0.2 Width: (cm) 0.2 Depth: (cm) 0.3 Volume: (cm) 0.009 Character of Wound/Ulcer Post Debridement: Stable [name], [name] ([id number]) [phone].pdf Page 4 of 14 Severity of Tissue Post Debridement: Fat layer exposed Post Procedure Diagnosis Same as Pre-procedure Notes Scribed for Dr. Mikey Bussing by Brenton Grills RN. Electronic Signature(s) Signed: 09/24/2022 1:29:58 PM By: Geralyn Corwin DO Signed: 10/22/2022 1:55:51 PM By: Brenton Grills Entered By: Brenton Grills on 09/24/2022 11:14:00 -------------------------------------------------------------------------------- HPI Details Patient Name: Date of Service: [name], [name]. 09/24/2022 10:30 [name] Medical Record Number: [id number] Patient Account Number: [id number] Date of Birth/Sex: Treating RN: 03-17-1956 (67 y.o. M) Primary Care Provider: Tama [name] Other Clinician: Referring Provider: Treating Provider/Extender: [name], [name] in Treatment: 23 History of Present Illness HPI Description: ADMISSION 10/16/20 This is [name] 67 year old man who is [name] type II diabetic. Initially seen by Dr. Sherral Hammers of vein and vascular  on 09/10/2021 for bilateral lower extremity rest pain right greater than left. His ABIs demonstrated monophasic waveforms at the ankles bilaterally with depressed toe pressures. His ABI in the right was 0.5 and on the left 0.28. There were no obtainable waveforms for TBI's. He underwent angiography on 09/10/2021 and had findings of severe PAD with 95% stenosis of the distal SFA. He also had peroneal and posterior tibial arteries occluded with significant collateralization in the calf there was reconstitution of the posterior tibial artery in the distal third of the calf. Ostia of the anterior tibial artery was not appreciated. He underwent [name] angioplasty of the superficial femoral artery. He required  admission to hospital from 09/12/2021 through 09/22/2021 with right foot cellulitis and sepsis. On 3/13 he underwent [name] right below-knee popliteal to posterior tibial bypass using [name] greater saphenous vein. Ultimately however he required [name] right TMA by podiatry which I believe was done on 3/17 for progressive diabetic foot infection. He was discharged to [facility] skilled facility. He arrives in clinic today with [name] large wound area over the entirety of his amputation site extending proximally. The attempted flap closure of the amputation site and his TMA has failed and the wound extends under the attempt to closure. Still some sutures in place. There is an odor but he is not systemically unwell. He is due for follow-up arterial studies and has an appointment with vascular surgery on 10/28/2021. They are using wet-to-dry dressings at [facility]. Past medical history includes type 2 diabetes with peripheral neuropathy, hypertension, obstructive sleep apnea 4/20; patient presents for follow-up. He is being discharged from his facility at [facility] today. They have been doing Dakin's wet-to-dry dressings. He states that his fiance can help with dressing changes. He is getting [facility] home [facility] to come out 3 times [name] week once he leaves the facility. He is scheduled to see vein and vascular on 5/12. He has not seen the wound himself. He puts covers over his face for wound exams. He currently denies systemic signs of infection. 4/28; patient admitted to the clinic 2 weeks ago with [name] dehisced right TMA in the setting of type 2 diabetes with significant PAD. He is status post revascularization. He has been discharged from the nursing home he was at and now is at home using Dakin's wet-to-dry dressings daily he has home health. He denies any systemic signs of infection 5/4; patient presents for follow-up. His fiance and home health have been changing his dressings. He currently denies systemic signs of infection.  He continues to put sheet covers over his face during our wound encounters because he does not want [name] look at the wound. 5/25; Patient presents for follow-up. He missed his last clinic appointment. He had [name] bone biopsy and culture done at last clinic visit that showed acute osteomyelitis with growth of E. coli, stenotrophomonas maltophilia and enteroccocus faecalis. [name] referral to infectious disease was made. He has not heard from them yet. He has been using Dakin's wet-to-dry dressings. He is now more comfortable with looking at the wound bed. 6/6; the patient comes into clinic today with 2 wounds on the left foot which apparently have been there for several months. This was not known to assess but was known by Dr. Randie Heinz. In fact he underwent an angiogram yesterday. He had [name] laser atherectomy of the left posterior tibial artery with [name] 3 mm balloon angioplasty, also [name] stent of the last left SFA. 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 This is [name] lot better than the last time I saw it. He has been using Dakin's wet-to-dry. He has an appointment with infectious disease for [name] bone biopsy which showed E. coli, stenotrophomonas and Enterococcus faecalis. The appointment is for tomorrow 04/14/2022 Mr. Estill Llerena ajuddin is an uncontrolled type 2 diabetic on insulin that presents with [name] left foot ulcer. On 12/18/2021 he required [name] left fifth ray amputation. He had revision of this site on 6/17 by Dr. Allena Katz, podiatry. He states he has had [name] wound VAC on this area for the past 3 weeks. It is unclear what the prior wound care has been. He has [name] history of osteomyelitis to this area and was treated with IV and oral antibiotics For 6 weeks by Dr. Luciana Axe of infectious disease. At his last follow-up on 02/20/2022 his CRP was trending down and antibiotics were  stopped. He also has [name] history of peripheral arterial disease status post stenting to the left SFA and balloon angioplasty to the left posterior tibial artery On 12/2021. He has follow-up with vein and vascular at the end of the month. He also has [name] wound to the dorsal aspect of the left foot that he has been placing Betadine on. 10/19; patient presents for follow-up. He had [name] bone biopsy of the left foot at last clinic visit showed osteomyelitis. The bone culture showed Enterobacter cloacae, Enterococcus faecalis, Staphylococcus aureus, viridans group strep, actinotigum schaelii. He has been referred to infectious disease And has an appointment on October 25. He has been using Dakin's wet-to-dry dressing to the lateral left foot wound and Hydrofera Blue and Medihoney to the dorsal foot wound. He has no issues or complaints today. He denies systemic signs of infection. 10/26; patient presents for follow-up. He saw Dr. Luciana Axe yesterday with infectious disease and has been prescribed doxycycline and Levaquin for his chronic [name], [name] ([id number]) [id number] Page 5 of 14 osteomyelitis. He has been using Dakin's wet-to-dry dressings to the lateral left foot wound and Medihoney and Hydrofera Blue to the dorsal wound. We discussed [name] skin graft for the dorsal foot wound and he would like to proceed with insurance verification. 11/3; patient with [name] wound on the dorsal left foot and [name] substantial postsurgical area on the lateral left foot fifth ray amputation. He is also followed by Dr. Rosetta Posner of infectious disease. He had [name] bone biopsy from the left lateral foot that showed acute osteomyelitis. Swab culture showed multiple organisms. He has also been revascularized by infectious disease status post left SFA stenting and tibial laser atherectomy and angioplasty. An MRI of the left foot had previously shown osteomyelitis of the fifth met head we have been using Dakin's  wet-to-dry to the left lateral foot and Medihoney and Hydrofera Blue to the wound on the dorsal foot. 11/13; HBO treatment was discussed at last clinic visit and patient would like to proceed with this. He has orientation today. He has been approved for Grafix. We have been using Hydrofera Blue and Medihoney to the left dorsal foot and collagen to the left lateral foot. Patient has home health that comes in for dressing changes. He currently has no issues or complaints today. 12/7; patient missed his last clinic appointment. He was last seen 3 weeks ago. He is currently off of antibiotics per ID. He completed [name] prolonged treatment.  He has been using collagen to the lateral foot wound. He has been using Medihoney and Hydrofera Blue to the dorsal foot wound. He has been doing well with hyperbaric oxygen therapy. He has no issues or complaints today. He denies signs of of infection. 12/14; patient presents for follow-up. We have been using Grafix to the wound beds. He has had trouble equalizing the pressure in his ears during HBO treatments. It was recommended he follow-up with ENT He does not have an appointment yet. He currently denies signs of infection. . 12/21; patient presents for follow-up. He was seen by ENT and had tubes placed. He is continued HBO without issues. We have been using Grafix to the wound beds. There is been improvement in wound healing. 12/21; patient presents for follow-up. He has no issues or complaints today. There is been improvement in wound healing. 1/5; patient presents for follow-up. At last clinic visit Grafix was placed in standard fashion. He has no issues or complaints today. He is tolerating hyperbaric oxygen therapy well. He denies signs of infection. 1/11; patient presents for follow-up. We have been placing Grafix to the wound beds on the left foot. The dorsal left foot wound has healed. The left lateral foot wound has shown improvement in healing. Unfortunately  has had dehiscence of his previous surgical wound site on the right foot. He currently denies signs of infection. 1/19; patient presents for follow-up. Grafix was placed to the left foot wound at last clinic visit. He has been using Dakin's wet-to-dry dressings to the right foot wound. He continues HBO therapy without issues. He has shown improvement in wound healing to the left foot. 1/26; patient presents for follow-up. We been placing Grafix to the left foot wound and patient has been using Dakin's wet-to-dry dressings to the right foot wound. There is been improvement in wound healing. He continues HBO therapy without issues. 2/2; patient presents for follow-up. We have been using Grafix to the left foot wound and Dakin's wet-to-dry dressings to the right foot wound. He has no issues or complaints today. 2/8; patient presents for follow-up. We have been using Grafix to the left foot wound and Dakin's wet-to-dry dressings to the right foot wound. He has no issues or complaints today. He continues HBO without issues. 2/15; patient presents for follow-up. We have been using Grafix to the wound beds. He continues HBO without issues. He has been offloading the wound beds with his surgical shoe. 2/22; patient presents for follow-up. We have been using Grafix to the wound beds. He has no issues or complaints today. He continues HBO therapy without issues. 2/29; patient presents for follow-up. We have been using Grafix to the wound beds. There is been improvement in wound healing. He has no issues or complaints today. He continues HBO therapy. 3/7; patient presents for follow-up. We have been using Grafix to the wound beds. Yesterday patient had an arteriogram to the right lower extremity with [name] drug-eluting stent placed in the superficial femoral artery. He has no issues to report on today. 3/14; patient presents for follow-up. We have been using Grafix to the wound beds. He continues to do HBO  therapy without issues. 3/21; patient presents for follow-up. We have been using Grafix to the wound bed. He continues to do HBO therapy. There continues to be improvement in wound healing. Electronic Signature(s) Signed: 09/24/2022 1:29:58 PM By: Geralyn Corwin DO Entered By: Geralyn Corwin on 09/24/2022 11:39:54 -------------------------------------------------------------------------------- Physical Exam Details Patient Name: Date of Service: [name]  JUDDIN, [name]. 09/24/2022 10:30 [name] Medical Record Number: [id number] Patient Account Number: [id number] Date of Birth/Sex: Treating RN: March 23, 1956 (67 y.o. M) Primary Care Provider: Tama [name] Other Clinician: Referring Provider: Treating Provider/Extender: [name], [name] in Treatment: 23 Constitutional respirations regular, non-labored and within target range for patient.. Cardiovascular 2+ dorsalis pedis/posterior tibialis pulses. Psychiatric pleasant and cooperative. [name], [name] ([id number]) [id number] Page 6 of 14 Notes Left foot: T the lateral aspect there is an open wound with granulation tissue and nonviable tissue. No probing to bone . Right foot: Along the previous TMA o site there is an open wound with nonviable tissue, granulation tissue and callus. Does not probe to bone. No signs of surrounding soft tissue infection. Electronic Signature(s) Signed: 09/24/2022 1:29:58 PM By: Geralyn Corwin DO Entered By: Geralyn Corwin on 09/24/2022 11:40:23 -------------------------------------------------------------------------------- Physician Orders Details Patient Name: Date of Service: [name], [name]. 09/24/2022 10:30 [name] Medical Record Number: [id number] Patient Account Number: [id number] Date of Birth/Sex: Treating RN: 1955/07/08 (67 y.o. [name] Primary Care Provider: Tama [name] Other Clinician: Referring Provider: Treating Provider/Extender:  Nelda Bucks in Treatment: 23 Verbal / Phone Orders: No Diagnosis Coding Follow-up Appointments ppointment in 1 week. - Dr. Mikey Bussing and [name] Rm # 9 Friday 10/02/22 @ 0930 after hyperbarics Return [name] ppointment in 2 weeks. - Dr. Lady Gary Covering (Dr. Mikey Bussing patient) Thursday 10/08/2022 1015 Return [name] Other: - ****ONLY CHANGE OUTER DRESSINGS *** Anesthetic (In clinic) Topical Lidocaine 5% applied to wound bed Cellular or Tissue Based Products Cellular or Tissue Based Product Type: - Grafix # 3 applied 06/25/22 GRafix # 4 applied 07/02/22 Grafix # 5 applied 07/10/22 Grafix # 6 applied 07/17/22 Grafix # 7 applied 07/24/22---Also, go ahead and run IVR for more Grafix for left lateral foot Grafix #8 applied 07/31/2022 Grafix # 9 applied 08/07/22] left foot Grafix #10 applied 08/13/2022 left and right foot wounds today split the grafix in half. Graftx #11 applied to both wounds. Patient right foot approved for total of 12 between dates 08/18/2022-11/04/2022. Grafix #12 applied to both wounds. grafix #4 right foot split grafix and use to left foot #13. total can have 24. Grafix # 5 applied to right foot split grafix and use to left foot # 14. Grafix #6 right foot and Grafix #16 to left foot applied. Cellular or Tissue Based Product applied to wound bed, secured with steri-strips, cover with Adaptic or Mepitel. (DO NOT REMOVE). Edema Control - Lymphedema / SCD / Other Avoid standing for long periods of time. Off-Loading Open toe surgical shoe to: - wear when standing or walking. Hyperbaric Oxygen Therapy Evaluate for HBO Therapy - 07/24/22- Pt. to finish next 8 HBO treatments 07/24/22- DR. Reghan Thul wants pt. to be extended for 40 more treatments Indication: - Wagner Grade III to left lateral foot 2.5 ATA for 90 Minutes with 2 Five (5) Minute [name] Breaks ir Total Number of Treatments: - 40; extend for 40 more= 80 treatments One treatments per day (delivered Monday through  Friday unless otherwise specified in Special Instructions below): Finger stick Blood Glucose Pre- and Post- HBOT Treatment. Follow Hyperbaric Oxygen Glycemia Protocol Afrin (Oxymetazoline HCL) 0.05% nasal spray - 1 spray in both nostrils daily as needed prior to HBO treatment for difficulty clearing ears Wound Treatment Wound #5 - Foot Wound Laterality: Left, Lateral Cleanser: Wound Cleanser (Generic) 1 x Per Day/30 Days Discharge Instructions: Cleanse the wound with wound cleanser prior to applying [name]  clean dressing using gauze sponges, not tissue or cotton balls. Peri-Wound Care: Skin Prep (Generic) 1 x Per Day/30 Days Discharge Instructions: Use skin prep as directed Prim Dressing: Cutimed Sorbact Swab 1 x Per Day/30 Days ary Discharge Instructions: Apply to wound bed behind grafix Prim Dressing: Grafix (Generic) 1 x Per Day/30 Days ary Discharge Instructions: applied by provider. Prim Dressing: adaptic and steri-strips ary 1 x Per Day/30 Days [name], [name] ([id number]) [id number] Page 7 of 14 Discharge Instructions: to secure grafix to wound bed. leave in place. Secondary Dressing: ABD Pad, 8x10 (Generic) 1 x Per Day/30 Days Discharge Instructions: Apply over primary dressing as directed. Secured With: [employer], 4.5x3.1 (in/yd) (Generic) 1 x Per Day/30 Days Discharge Instructions: Secure with Kerlix as directed. Secured With: 41M Medipore H Soft Cloth Surgical T ape, 4 x 10 (in/yd) (Generic) 1 x Per Day/30 Days Discharge Instructions: Secure with tape as directed. Wound #6 - Amputation Site - Transmetatarsal Wound Laterality: Right Cleanser: Wound Cleanser (Generic) 1 x Per Day/30 Days Discharge Instructions: Cleanse the wound with wound cleanser prior to applying [name] clean dressing using gauze sponges, not tissue or cotton balls. Peri-Wound Care: Skin Prep (Generic) 1 x Per Day/30 Days Discharge Instructions: Use skin prep as directed Prim  Dressing: Grafix (Generic) 1 x Per Day/30 Days ary Discharge Instructions: applied by provider. Prim Dressing: adaptic and steri-strips 1 x Per Day/30 Days ary Discharge Instructions: to secure grafix to wound bed. leave in place. Prim Dressing: cutimed sorbact 1 x Per Day/30 Days ary Discharge Instructions: lightly pack into wound bed behind the grafix. Secondary Dressing: ABD Pad, 8x10 (Generic) 1 x Per Day/30 Days Discharge Instructions: Apply over primary dressing as directed. Secured With: [employer], 4.5x3.1 (in/yd) (Generic) 1 x Per Day/30 Days Discharge Instructions: Secure with Kerlix as directed. Secured With: 41M Medipore H Soft Cloth Surgical T ape, 4 x 10 (in/yd) (Generic) 1 x Per Day/30 Days Discharge Instructions: Secure with tape as directed. GLYCEMIA INTERVENTIONS PROTOCOL PRE-HBO GLYCEMIA INTERVENTIONS ACTION INTERVENTION Obtain pre-HBO capillary blood glucose (ensure 1 physician order is in chart). [name]. Notify HBO physician and await physician orders. 2 If result is 70 mg/dl or below: B. If the result meets the hospital definition of [name] critical result, follow hospital policy. [name]. Give patient an 8 ounce Glucerna Shake, an 8 ounce Ensure, or 8 ounces of [name] Glucerna/Ensure equivalent dietary supplement*. B. Wait 30 minutes. If result is 71 mg/dl to 409 mg/dl: C. Retest patients capillary blood glucose (CBG). D. If result greater than or equal to 110 mg/dl, proceed with HBO. If result less than 110 mg/dl, notify HBO physician and consider holding HBO. If result is 131 mg/dl to 811 mg/dl: [name]. Proceed with HBO. [name]. Notify HBO physician and await physician orders. B. It is recommended to hold HBO and do If result is 250 mg/dl or greater: blood/urine ketone testing. C. If the result meets the hospital definition of [name] critical result, follow hospital policy. POST-HBO GLYCEMIA INTERVENTIONS ACTION INTERVENTION Obtain post HBO capillary blood glucose  (ensure 1 physician order is in chart). [name]. Notify HBO physician and await physician orders. 2 If result is 70 mg/dl or below: B. If the result meets the hospital definition of [name] critical result, follow hospital policy. [name]. Give patient an 8 ounce Glucerna Shake, an 8 ounce Ensure, or 8 ounces of [name] Glucerna/Ensure equivalent dietary supplement*. B. Wait 15 minutes for symptoms of If result is 71 mg/dl to 914 mg/dl: hypoglycemia (i.e. nervousness,  anxiety, sweating, chills, clamminess, irritability, confusion, tachycardia or dizziness). C. If patient asymptomatic, discharge patient. If patient symptomatic, repeat capillary blood KHUSH, PASION [name] ([id number]) [phone].pdf Page 8 of 14 glucose (CBG) and notify HBO physician. If result is 101 mg/dl to 841 mg/dl: [name]. Discharge patient. [name]. Notify HBO physician and await physician orders. B. It is recommended to do blood/urine ketone If result is 250 mg/dl or greater: testing. C. If the result meets the hospital definition of [name] critical result, follow hospital policy. *Juice or candies are NOT equivalent products. If patient refuses the Glucerna or Ensure, please consult the hospital dietitian for an appropriate substitute. Electronic Signature(s) Signed: 09/24/2022 1:29:58 PM By: Geralyn Corwin DO Entered By: Geralyn Corwin on 09/24/2022 11:40:32 -------------------------------------------------------------------------------- Problem List Details Patient Name: Date of Service: [name], [name]. 09/24/2022 10:30 [name] Medical Record Number: [id number] Patient Account Number: [id number] Date of Birth/Sex: Treating RN: 17-May-1956 (67 y.o. M) Primary Care Provider: Tama [name] Other Clinician: Referring Provider: Treating Provider/Extender: Nelda Bucks in Treatment: 23 Active Problems ICD-10 Encounter Code Description Active Date MDM Diagnosis L97.522 Non-pressure  chronic ulcer of other part of left foot with fat layer exposed 04/14/2022 No Yes M86.172 Other acute osteomyelitis, left ankle and foot 05/08/2022 No Yes L97.528 Non-pressure chronic ulcer of other part of left foot with other specified 04/14/2022 No Yes severity E11.621 Type 2 diabetes mellitus with foot ulcer 04/14/2022 No Yes E11.42 Type 2 diabetes mellitus with diabetic polyneuropathy 04/14/2022 No Yes S91.301A Unspecified open wound, right foot, initial encounter 07/16/2022 No Yes 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 07/16/2022 No Yes severity Inactive Problems Resolved Problems Electronic Signature(s) Signed: 09/24/2022 1:29:58 PM By: Geralyn Corwin DO Entered By: Geralyn Corwin on 09/24/2022 11:37:42 Lum Keas [name] ([id number]) [id number] Page 9 of 14 -------------------------------------------------------------------------------- Progress Note Details Patient Name: Date of Service: [name], [place] [name]. 09/24/2022 10:30 [name] Medical Record Number: [id number] Patient Account Number: [id number] Date of Birth/Sex: Treating RN: 06-07-1956 (67 y.o. M) Primary Care Provider: Tama [name] Other Clinician: Referring Provider: Treating Provider/Extender: [name], [name] in Treatment: 23 Subjective Chief Complaint Information obtained from Patient 10/16/20; patient is here with [name] substantial wound on his right foot in the setting of [name] recent transmetatarsal amputation. 04/14/2022; referral from podiatry for postsurgical wound of nonhealing partial left fifth ray amputation, right foot (07/16/22): TMA dehiscence History of Present Illness (HPI) ADMISSION 10/16/20 This is [name] 67 year old man who is [name] type II diabetic. Initially seen by Dr. Sherral Hammers of vein and vascular on 09/10/2021 for bilateral lower extremity rest pain right greater than left. His ABIs demonstrated monophasic waveforms at the  ankles bilaterally with depressed toe pressures. His ABI in the right was 0.5 and on the left 0.28. There were no obtainable waveforms for TBI's. He underwent angiography on 09/10/2021 and had findings of severe PAD with 95% stenosis of the distal SFA. He also had peroneal and posterior tibial arteries occluded with significant collateralization in the calf there was reconstitution of the posterior tibial artery in the distal third of the calf. Ostia of the anterior tibial artery was not appreciated. He underwent [name] angioplasty of the superficial femoral artery. He required admission to hospital from 09/12/2021 through 09/22/2021 with right foot cellulitis and sepsis. On 3/13 he underwent [name] right below-knee popliteal to posterior tibial bypass using [name] greater saphenous vein. Ultimately however he required [name] right TMA by podiatry which I believe  was done on 3/17 for progressive diabetic foot infection. He was discharged to [facility] skilled facility. He arrives in clinic today with [name] large wound area over the entirety of his amputation site extending proximally. The attempted flap closure of the amputation site and his TMA has failed and the wound extends under the attempt to closure. Still some sutures in place. There is an odor but he is not systemically unwell. He is due for follow-up arterial studies and has an appointment with vascular surgery on 10/28/2021. They are using wet-to-dry dressings at [facility]. Past medical history includes type 2 diabetes with peripheral neuropathy, hypertension, obstructive sleep apnea 4/20; patient presents for follow-up. He is being discharged from his facility at [facility] today. They have been doing Dakin's wet-to-dry dressings. He states that his fiance can help with dressing changes. He is getting [facility] to come out 3 times [name] week once he leaves the facility. He is scheduled to see vein and vascular on 5/12. He has not seen the wound himself. He puts  covers over his face for wound exams. He currently denies systemic signs of infection. 4/28; patient admitted to the clinic 2 weeks ago with [name] dehisced right TMA in the setting of type 2 diabetes with significant PAD. He is status post revascularization. He has been discharged from the nursing home he was at and now is at home using Dakin's wet-to-dry dressings daily he has home health. He denies any systemic signs of infection 5/4; patient presents for follow-up. His fiance and home health have been changing his dressings. He currently denies systemic signs of infection. He continues to put sheet covers over his face during our wound encounters because he does not want [name] look at the wound. 5/25; Patient presents for follow-up. He missed his last clinic appointment. He had [name] bone biopsy and culture done at last clinic visit that showed acute osteomyelitis with growth of E. coli, stenotrophomonas maltophilia and enteroccocus faecalis. [name] referral to infectious disease was made. He has not heard from them yet. He has been using Dakin's wet-to-dry dressings. He is now more comfortable with looking at the wound bed. 6/6; the patient comes into clinic today with 2 wounds on the left foot which apparently have been there for several months. This was not known to assess but was known by Dr. Randie Heinz. In fact he underwent an angiogram yesterday. He had [name] laser atherectomy of the left posterior tibial artery with [name] 3 mm balloon angioplasty, also [name] stent of the last left SFA. 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 This is [name] lot better than the last time I saw it. He has been using Dakin's wet-to-dry. He has an appointment with infectious disease for [name] bone biopsy which showed E. coli, stenotrophomonas and Enterococcus faecalis. The appointment is for  tomorrow 04/14/2022 Mr. Johnte Portnoy ajuddin is an uncontrolled type 2 diabetic on insulin that presents with [name] left foot ulcer. On 12/18/2021 he required [name] left fifth ray amputation. He had revision of this site on 6/17 by Dr. Allena Katz, podiatry. He states he has had [name] wound VAC on this area for the past 3 weeks. It is unclear what the prior wound care has been. He has [name] history of osteomyelitis to this area and was treated with IV and oral antibiotics  For 6 weeks by Dr. Luciana Axe of infectious disease. At his last follow-up on 02/20/2022 his CRP was trending down and antibiotics were stopped. He also has [name] history of peripheral arterial disease status post stenting to the left SFA and balloon angioplasty to the left posterior tibial artery On 12/2021. He has follow-up with vein and vascular at the end of the month. He also has [name] wound to the dorsal aspect of the left foot that he has been placing Betadine on. 10/19; patient presents for follow-up. He had [name] bone biopsy of the left foot at last clinic visit showed osteomyelitis. The bone culture showed Enterobacter cloacae, Enterococcus faecalis, Staphylococcus aureus, viridans group strep, actinotigum schaelii. He has been referred to infectious disease And has an appointment on October 25. He has been using Dakin's wet-to-dry dressing to the lateral left foot wound and Hydrofera Blue and Medihoney to the dorsal foot wound. He has no issues or complaints today. He denies systemic signs of infection. 10/26; patient presents for follow-up. He saw Dr. Luciana Axe yesterday with infectious disease and has been prescribed doxycycline and Levaquin for his chronic osteomyelitis. He has been using Dakin's wet-to-dry dressings to the lateral left foot wound and Medihoney and Hydrofera Blue to the dorsal wound. We discussed [name] skin graft for the dorsal foot wound and he would like to proceed with insurance verification. 11/3; patient with [name] wound on the dorsal left foot and [name]  substantial postsurgical area on the lateral left foot fifth ray amputation. He is also followed by Dr. Rosetta Posner of infectious disease. He had [name] bone biopsy from the left lateral foot that showed acute osteomyelitis. Swab culture showed multiple organisms. He has also been revascularized by infectious disease status post left SFA stenting and tibial laser atherectomy and angioplasty. An MRI of the left foot had previously shown osteomyelitis of the fifth met head we have been using Dakin's wet-to-dry to the left lateral foot and Medihoney and Hydrofera Blue to the wound on the dorsal foot. 11/13; HBO treatment was discussed at last clinic visit and patient would like to proceed with this. He has orientation today. He has been approved for Grafix. We have been using Hydrofera Blue and Medihoney to the left dorsal foot and collagen to the left lateral foot. Patient has home health that comes in for dressing changes. He currently has no issues or complaints today. 12/7; patient missed his last clinic appointment. He was last seen 3 weeks ago. He is currently off of antibiotics per ID. He completed [name] prolonged treatment. He has been using collagen to the lateral foot wound. He has been using Medihoney and Hydrofera Blue to the dorsal foot wound. He has been doing well with [name], [name] ([id number]) [id number] Page 10 of 14 hyperbaric oxygen therapy. He has no issues or complaints today. He denies signs of of infection. 12/14; patient presents for follow-up. We have been using Grafix to the wound beds. He has had trouble equalizing the pressure in his ears during HBO treatments. It was recommended he follow-up with ENT He does not have an appointment yet. He currently denies signs of infection. . 12/21; patient presents for follow-up. He was seen by ENT and had tubes placed. He is continued HBO without issues. We have been using Grafix to the wound beds. There is been  improvement in wound healing. 12/21; patient presents for follow-up. He has no issues or complaints today. There is been improvement in wound healing. 1/5; patient presents for follow-up. At  last clinic visit Grafix was placed in standard fashion. He has no issues or complaints today. He is tolerating hyperbaric oxygen therapy well. He denies signs of infection. 1/11; patient presents for follow-up. We have been placing Grafix to the wound beds on the left foot. The dorsal left foot wound has healed. The left lateral foot wound has shown improvement in healing. Unfortunately has had dehiscence of his previous surgical wound site on the right foot. He currently denies signs of infection. 1/19; patient presents for follow-up. Grafix was placed to the left foot wound at last clinic visit. He has been using Dakin's wet-to-dry dressings to the right foot wound. He continues HBO therapy without issues. He has shown improvement in wound healing to the left foot. 1/26; patient presents for follow-up. We been placing Grafix to the left foot wound and patient has been using Dakin's wet-to-dry dressings to the right foot wound. There is been improvement in wound healing. He continues HBO therapy without issues. 2/2; patient presents for follow-up. We have been using Grafix to the left foot wound and Dakin's wet-to-dry dressings to the right foot wound. He has no issues or complaints today. 2/8; patient presents for follow-up. We have been using Grafix to the left foot wound and Dakin's wet-to-dry dressings to the right foot wound. He has no issues or complaints today. He continues HBO without issues. 2/15; patient presents for follow-up. We have been using Grafix to the wound beds. He continues HBO without issues. He has been offloading the wound beds with his surgical shoe. 2/22; patient presents for follow-up. We have been using Grafix to the wound beds. He has no issues or complaints today. He continues HBO  therapy without issues. 2/29; patient presents for follow-up. We have been using Grafix to the wound beds. There is been improvement in wound healing. He has no issues or complaints today. He continues HBO therapy. 3/7; patient presents for follow-up. We have been using Grafix to the wound beds. Yesterday patient had an arteriogram to the right lower extremity with [name] drug-eluting stent placed in the superficial femoral artery. He has no issues to report on today. 3/14; patient presents for follow-up. We have been using Grafix to the wound beds. He continues to do HBO therapy without issues. 3/21; patient presents for follow-up. We have been using Grafix to the wound bed. He continues to do HBO therapy. There continues to be improvement in wound healing. Patient History Information obtained from Patient, Chart. Family History Diabetes - Mother,Siblings, Heart Disease - Siblings, Hypertension - Siblings, Stroke - Mother, No family history of Cancer, Hereditary Spherocytosis, Kidney Disease, Lung Disease, Seizures, Thyroid Problems, Tuberculosis. Social History Former smoker, Marital Status - Single, Alcohol Use - Never, Drug Use - No History, Caffeine Use - Daily. Medical History Eyes Denies history of Cataracts, Glaucoma, Optic Neuritis Respiratory Patient has history of Asthma, Sleep Apnea Denies history of Aspiration, Chronic Obstructive Pulmonary Disease (COPD), Pneumothorax, Tuberculosis Cardiovascular Patient has history of Hypertension, Peripheral Venous Disease Denies history of Angina, Arrhythmia, Congestive Heart Failure, Coronary Artery Disease, Deep Vein Thrombosis, Hypotension, Myocardial Infarction, Peripheral Arterial Disease, Phlebitis, Vasculitis Endocrine Patient has history of Type II Diabetes Denies history of Type I Diabetes Integumentary (Skin) Denies history of History of Burn Musculoskeletal Patient has history of Osteomyelitis Denies history of Gout, Rheumatoid  Arthritis, Osteoarthritis Neurologic Patient has history of Neuropathy Denies history of Dementia, Quadriplegia, Paraplegia, Seizure Disorder Hospitalization/Surgery History - 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 Allena Katz. -  aortogram 12/08/2021 Dr. Karin Lieu [facility]. Medical [name] Surgical History Notes nd Psychiatric PTSD [name], [name] ([id number]) [phone].pdf Page 11 of 14 Objective Constitutional respirations regular, non-labored and within target range for patient.. Vitals Time Taken: 10:38 AM, Height: 72 in, Weight: 220 lbs, BMI: 29.8, Temperature: 97.5 F, Pulse: 74 bpm, Respiratory Rate: 18 breaths/min, Blood Pressure: 133/82 mmHg, Capillary Blood Glucose: 145 mg/dl. Cardiovascular 2+ dorsalis pedis/posterior tibialis pulses. Psychiatric pleasant and cooperative. General Notes: Left foot: T the lateral aspect there is an open wound with granulation tissue and nonviable tissue. No probing to bone . Right foot: Along the o previous TMA site there is an open wound with nonviable tissue, granulation tissue and callus. Does not probe to bone. No signs of surrounding soft tissue infection. Integumentary (Hair, Skin) Wound #5 status is Open. Original cause of wound was Surgical Injury. The date acquired was: 12/20/2021. The wound has been in treatment 23 weeks. The wound is located on the Left,Lateral Foot. The wound measures 2.4cm length x 0.2cm width x 0.3cm depth; 0.377cm^2 area and 0.113cm^3 volume. There is Fat Layer (Subcutaneous Tissue) exposed. There is no tunneling or undermining noted. There is [name] medium amount of serosanguineous drainage noted. The wound margin is epibole. There is large (67-100%) red granulation within the wound bed. There is [name] small (1-33%) amount of necrotic tissue within the wound bed including Adherent Slough. The periwound skin appearance had no abnormalities noted for color. The periwound skin appearance  exhibited: Callus. The periwound skin appearance did not exhibit: Crepitus, Excoriation, Induration, Rash, Scarring, Dry/Scaly, Maceration. Periwound temperature was noted as No Abnormality. The periwound has tenderness on palpation. Wound #6 status is Open. Original cause of wound was Gradually Appeared. The date acquired was: 07/16/2022. The wound has been in treatment 10 weeks. The wound is located on the Right Amputation Site - Transmetatarsal. The wound measures 0.2cm length x 0.2cm width x 0.3cm depth; 0.031cm^2 area and 0.009cm^3 volume. There is Fat Layer (Subcutaneous Tissue) exposed. There is no tunneling or undermining noted. There is [name] small amount of serosanguineous drainage noted. The wound margin is distinct with the outline attached to the wound base. There is large (67-100%) red granulation within the wound bed. There is no necrotic tissue within the wound bed. The periwound skin appearance had no abnormalities noted for color. The periwound skin appearance exhibited: Callus, Dry/Scaly. The periwound skin appearance did not exhibit: Crepitus, Excoriation, Induration, Rash, Scarring, Maceration. Periwound temperature was noted as No Abnormality. Assessment Active Problems ICD-10 Non-pressure chronic ulcer of other part of left foot with fat layer exposed Other acute osteomyelitis, left ankle and foot Non-pressure chronic ulcer of other part of left foot with other specified severity Type 2 diabetes mellitus with foot ulcer Type 2 diabetes mellitus with diabetic polyneuropathy Unspecified open wound, right foot, initial encounter 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 severity Patient's wounds appear well-healing. I debrided nonviable tissue and Grafix was placed in standard fashion. I recommended continuing to aggressively offload the areas with his surgical shoes. Continue HBO therapy. Procedures Wound #5 Pre-procedure diagnosis of Wound #5  is [name] Diabetic Wound/Ulcer of the Lower Extremity located on the Left,Lateral Foot .Severity of Tissue Pre Debridement is: Fat layer exposed. There was [name] Selective/Open Wound Non-Viable Tissue Debridement with [name] total area of 0.48 sq cm performed by Geralyn Corwin, DO. With the following instrument(s): Curette Material removed includes Callus and Slough and after achieving pain control using Lidocaine 4% Topical Solution. No  specimens were taken. [name] time out was conducted at 11:05, prior to the start of the procedure. [name] Minimum amount of bleeding was controlled with Pressure. The procedure was tolerated well with [name] pain level of 0 throughout and [name] pain level of 0 following the procedure. Post Debridement Measurements: 2.4cm length x 0.2cm width x 0.3cm depth; 0.113cm^3 volume. Character of Wound/Ulcer Post Debridement is stable. Severity of Tissue Post Debridement is: Fat layer exposed. Post procedure Diagnosis Wound #5: Same as Pre-Procedure General Notes: Scribed on behalf of Dr. Mikey Bussing by Brenton Grills RN.. 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 [name] skin graft procedure using [name] bioengineered skin substitute/cellular or tissue based product was performed by Geralyn Corwin, DO with the following instrument(s): Forceps and Scissors. Grafix prime was applied and secured with Steri-Strips and Adaptic. 1 sq cm of product was utilized and 0 sq cm was wasted. Post Application, no dressing was applied. [name] Time Out was conducted at 11:15, prior to the start of the procedure. The procedure was tolerated well with [name] pain level of 0 throughout and [name] pain level of 0 following the procedure. Post procedure Diagnosis Wound #5: Same as Pre-Procedure . 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 There was [name] Selective/Open Wound Skin/Epidermis Debridement with [name] total area of 1 sq cm performed by Geralyn Corwin, DO. With the following instrument(s): Curette to remove Viable and Non-Viable tissue/material. Material removed includes Callus, Slough, Skin: Dermis, and Skin: Epidermis after achieving pain control using Lidocaine 4% Topical Solution. No specimens were taken. [name] time out was conducted at 11:05, prior to the start of the procedure. [name] Minimum amount of bleeding was controlled with Pressure. The procedure was tolerated well with [name] pain level of 0 throughout and [name] pain level of 0 following the procedure. Post Debridement Measurements: 0.2cm length x 0.2cm width x 0.3cm depth; 0.009cm^3 volume. Character of Wound/Ulcer Post Debridement is stable. Severity of Tissue Post Debridement is: Fat layer exposed. Post procedure Diagnosis Wound #6: Same as Pre-Procedure General Notes: Scribed for Dr. Mikey Bussing by Brenton Grills RN.. 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 [name] skin graft procedure using [name] bioengineered skin substitute/cellular or tissue based product was performed by Geralyn Corwin, DO with the following instrument(s): Forceps and Scissors. Grafix prime was applied and secured with Steri-Strips and Adaptic. 0 sq cm of product was utilized and 1 sq cm was wasted due to wound size. Post Application, cutimed sorbact lightly packed over grafix. was applied. [name] Time Out was conducted at 11:15, prior to the start of the procedure. The procedure was tolerated well with [name] pain level of 0 throughout and [name] pain level of 0 following the procedure. Post procedure Diagnosis Wound #6: Same as Pre-Procedure General Notes: Scribed for Dr. Mikey Bussing by Brenton Grills RN. Plan Follow-up  Appointments: Return Appointment in 1 week. - Dr. Mikey Bussing and [name] Rm # 9 Friday 10/02/22 @ 0930 after hyperbarics Return Appointment in 2 weeks. - Dr. Lady Gary Covering (Dr. Mikey Bussing patient) Thursday 10/08/2022 1015 Other: - ****ONLY CHANGE OUTER DRESSINGS *** Anesthetic: (In clinic) Topical Lidocaine 5% applied to wound bed Cellular or Tissue Based Products: Cellular or Tissue  Based Product Type: - Grafix # 3 applied 06/25/22 GRafix # 4 applied 07/02/22 Grafix # 5 applied 07/10/22 Grafix # 6 applied 07/17/22 Grafix # 7 applied 07/24/22---Also, go ahead and run IVR for more Grafix for left lateral foot Grafix #8 applied 07/31/2022 Grafix # 9 applied 08/07/22] left foot Grafix #10 applied 08/13/2022 left and right foot wounds today split the grafix in half. Graftx #11 applied to both wounds. Patient right foot approved for total of 12 between dates 08/18/2022-11/04/2022. Grafix #12 applied to both wounds. grafix #4 right foot split grafix and use to left foot #13. total can have 24. Grafix # 5 applied to right foot split grafix and use to left foot # 14. Grafix #6 right foot and Grafix #16 to left foot applied. Cellular or Tissue Based Product applied to wound bed, secured with steri-strips, cover with Adaptic or Mepitel. (DO NOT REMOVE). Edema Control - Lymphedema / SCD / Other: Avoid standing for long periods of time. Off-Loading: Open toe surgical shoe to: - wear when standing or walking. Hyperbaric Oxygen Therapy: Evaluate for HBO Therapy - 07/24/22- Pt. to finish next 8 HBO treatments 07/24/22- DR. Chastidy Ranker wants pt. to be extended for 40 more treatments Indication: - Wagner Grade III to left lateral foot 2.5 ATA for 90 Minutes with 2 Five (5) Minute Air Breaks T Number of Treatments: - 40; extend for 40 more= 80 treatments otal One treatments per day (delivered Monday through Friday unless otherwise specified in Special Instructions below): Finger stick Blood Glucose Pre- and Post- HBOT  Treatment. Follow Hyperbaric Oxygen Glycemia Protocol Afrin (Oxymetazoline HCL) 0.05% nasal spray - 1 spray in both nostrils daily as needed prior to HBO treatment for difficulty clearing ears WOUND #5: - Foot Wound Laterality: Left, Lateral Cleanser: Wound Cleanser (Generic) 1 x Per Day/30 Days Discharge Instructions: Cleanse the wound with wound cleanser prior to applying [name] clean dressing using gauze sponges, not tissue or cotton balls. Peri-Wound Care: Skin Prep (Generic) 1 x Per Day/30 Days Discharge Instructions: Use skin prep as directed Prim Dressing: Cutimed Sorbact Swab 1 x Per Day/30 Days ary Discharge Instructions: Apply to wound bed behind grafix Prim Dressing: Grafix (Generic) 1 x Per Day/30 Days ary Discharge Instructions: applied by provider. Prim Dressing: adaptic and steri-strips 1 x Per Day/30 Days ary Discharge Instructions: to secure grafix to wound bed. leave in place. Secondary Dressing: ABD Pad, 8x10 (Generic) 1 x Per Day/30 Days Discharge Instructions: Apply over primary dressing as directed. Secured With: [employer], 4.5x3.1 (in/yd) (Generic) 1 x Per Day/30 Days Discharge Instructions: Secure with Kerlix as directed. Secured With: 4M Medipore H Soft Cloth Surgical T ape, 4 x 10 (in/yd) (Generic) 1 x Per Day/30 Days Discharge Instructions: Secure with tape as directed. WOUND #6: - Amputation Site - Transmetatarsal Wound Laterality: Right Cleanser: Wound Cleanser (Generic) 1 x Per Day/30 Days Discharge Instructions: Cleanse the wound with wound cleanser prior to applying [name] clean dressing using gauze sponges, not tissue or cotton balls. Peri-Wound Care: Skin Prep (Generic) 1 x Per Day/30 Days Discharge Instructions: Use skin prep as directed Prim Dressing: Grafix (Generic) 1 x Per Day/30 Days ary Discharge Instructions: applied by provider. Prim Dressing: adaptic and steri-strips 1 x Per Day/30 Days ary Discharge Instructions: to secure grafix to wound  bed. leave in place. Prim Dressing: cutimed sorbact 1 x Per Day/30 Days ary Discharge Instructions: lightly pack into wound bed behind the grafix. Secondary Dressing: ABD Pad, 8x10 (Generic) 1 x Per Day/30 Days  Discharge Instructions: Apply over primary dressing as directed. Secured With: [employer], 4.5x3.1 (in/yd) (Generic) 1 x Per Day/30 Days Discharge Instructions: Secure with Kerlix as directed. Secured With: 40M Medipore H Soft Cloth Surgical T ape, 4 x 10 (in/yd) (Generic) 1 x Per Day/30 Days Discharge Instructions: Secure with tape as directed. 1. In office sharp debridement 2. Grafix placed in standard fashion 3. Follow-up in 1 week 4. Continue HBO therapy [name], [name] ([id number]) [id number] Page 13 of 14 Electronic Signature(s) Signed: 09/24/2022 1:29:58 PM By: Geralyn Corwin DO Entered By: Geralyn Corwin on 09/24/2022 11:41:28 -------------------------------------------------------------------------------- HxROS Details Patient Name: Date of Service: [name], [name]. 09/24/2022 10:30 [name] Medical Record Number: [id number] Patient Account Number: [id number] Date of Birth/Sex: Treating RN: 12/31/1955 (67 y.o. M) Primary Care Provider: Tama [name] Other Clinician: Referring Provider: Treating Provider/Extender: Nelda Bucks in Treatment: 23 Information Obtained From Patient Chart Eyes Medical History: Negative for: Cataracts; Glaucoma; Optic Neuritis Respiratory Medical History: Positive for: Asthma; Sleep Apnea Negative for: Aspiration; Chronic Obstructive Pulmonary Disease (COPD); Pneumothorax; Tuberculosis Cardiovascular Medical History: Positive for: Hypertension; Peripheral Venous Disease Negative for: Angina; Arrhythmia; Congestive Heart Failure; Coronary Artery Disease; Deep Vein Thrombosis; Hypotension; Myocardial Infarction; Peripheral Arterial Disease; Phlebitis;  Vasculitis Endocrine Medical History: Positive for: Type II Diabetes Negative for: Type I Diabetes Time with diabetes: 20 years Treated with: Insulin, Oral agents Blood sugar tested every day: Yes Tested : 4x day Integumentary (Skin) Medical History: Negative for: History of Burn Musculoskeletal Medical History: Positive for: Osteomyelitis Negative for: Gout; Rheumatoid Arthritis; Osteoarthritis Neurologic Medical History: Positive for: Neuropathy Negative for: Dementia; Quadriplegia; Paraplegia; Seizure Disorder Psychiatric Medical History: Past Medical History Notes: PTSD Immunizations Pneumococcal Vaccine: Received Pneumococcal Vaccination: No Implantable Devices [name], [name] ([id number]) [id number] Page 14 of 14 None Hospitalization / Surgery History Type of Hospitalization/Surgery 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 Allena Katz aortogram 12/08/2021 Dr. Karin Lieu [facility] Family and Social History Cancer: No; Diabetes: Yes - Mother,Siblings; Heart Disease: Yes - Siblings; Hereditary Spherocytosis: No; Hypertension: Yes - Siblings; Kidney Disease: No; Lung Disease: No; Seizures: No; Stroke: Yes - Mother; Thyroid Problems: No; Tuberculosis: No; Former smoker; Marital Status - Single; Alcohol Use: Never; Drug Use: No History; Caffeine Use: Daily; Financial Concerns: No; Food, Clothing or Shelter Needs: No; Support System Lacking: No; Transportation Concerns: No Electronic Signature(s) Signed: 09/24/2022 1:29:58 PM By: Geralyn Corwin DO Entered By: Geralyn Corwin on 09/24/2022 11:40:00 -------------------------------------------------------------------------------- SuperBill Details Patient Name: Date of Service: [name], [name]. 09/24/2022 Medical Record Number: [id number] Patient Account Number: [id number] Date of Birth/Sex: Treating RN: Mar 11, 1956 (67 y.o. [name] Primary Care Provider: Tama [name] Other  Clinician: Referring Provider: Treating Provider/Extender: [name], [name] in Treatment: 23 Diagnosis Coding ICD-10 Codes Code Description [phone] Non-pressure chronic ulcer of other part of left foot with fat layer exposed M86.172 Other acute osteomyelitis, left ankle and foot L97.528 Non-pressure chronic ulcer of other part of left foot with other specified severity E11.621 Type 2 diabetes mellitus with foot ulcer E11.42 Type 2 diabetes mellitus with diabetic polyneuropathy S91.301A Unspecified open wound, right foot, initial encounter 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 severity Facility Procedures : CPT4 Code: 41324401 Description: Q4133- Grafix PL 16 mm disc (2 units) Modifier: Quantity: 2 : CPT4 Code: [id number] Description: 15275 - SKIN SUB GRAFT FACE/NK/HF/G ICD-10 Diagnosis Description 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  specified seve L97.522 Non-pressure chronic ulcer of other part of left foot with fat layer exposed  E11.621 Type 2 diabetes mellitus with foot ulcer Modifier: rity Quantity: 1 Physician Procedures : CPT4 Code Description Modifier [id number] 15275 - WC PHYS SKIN SUB GRAFT FACE/NK/HF/G ICD-10 Diagnosis Description 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 severity L97.522 Non-pressure chronic ulcer of other  part of left foot with fat layer exposed E11.621 Type 2 diabetes mellitus with foot ulcer Quantity: 1 Electronic Signature(s) Signed: 09/24/2022 1:29:58 PM By: Geralyn Corwin DO Entered By: Geralyn Corwin on 09/24/2022 11:41:41

## 2022-10-22 NOTE — Progress Notes (Signed)
CRYSTAL, ELLWOOD A ([id number]) [id number] Page 1 of 2 Visit Report for 10/21/2022 Arrival Information Details Patient Name: Date of Service: [name], [name] 10/21/2022 2:00 PM Medical Record Number: [id number] Patient Account Number: [id number] Date of Birth/Sex: Treating RN: 11/19/1955 (67 y.o. [name] Primary Care [name]: [name] Other Clinician: Karl [name] Referring [name]: Treating [name]/Extender: [name], [name] in Treatment: 27 Visit Information History Since Last Visit All ordered tests and consults were completed: Yes Patient Arrived: Wheel Chair Added or deleted any medications: No Arrival Time: 12:57 Any new allergies or adverse reactions: No Accompanied By: None Had a fall or experienced change in No Transfer Assistance: None activities of daily living that may affect Patient Identification Verified: Yes risk of falls: Secondary Verification Process Completed: Yes Signs or symptoms of abuse/neglect since last visito No Patient Requires Transmission-Based Precautions: No Hospitalized since last visit: No Patient Has Alerts: Yes Implantable device outside of the clinic excluding No cellular tissue based products placed in the center since last visit: Pain Present Now: No Electronic Signature(s) Signed: 10/21/2022 4:29:08 PM By: [name] EMT Entered By: [name] on 10/21/2022 16:29:08 -------------------------------------------------------------------------------- Encounter Discharge Information Details Patient Name: Date of Service: [name], [name] 10/21/2022 2:00 PM Medical Record Number: [id number] Patient Account Number: [id number] Date of Birth/Sex: Treating RN: 18-Mar-1956 (67 y.o. [name] Primary Care [name]: [name] Other Clinician: Karl [name] Referring Tarika Mckethan: Treating [name]/Extender: [name], [name] in Treatment:  27 Encounter Discharge Information Items Discharge Condition: Stable Ambulatory Status: Wheelchair Discharge Destination: Home Transportation: Private Auto Accompanied By: None Schedule Follow-up Appointment: Yes Clinical Summary of Care: Electronic Signature(s) Signed: 10/21/2022 4:45:28 PM By: [name] EMT Entered By: [name] on 10/21/2022 16:45:28 -------------------------------------------------------------------------------- Vitals Details Patient Name: Date of Service: [name], [name] 10/21/2022 2:00 PM Medical Record Number: [id number] Patient Account Number: [id number] Date of Birth/Sex: Treating RN: 1955-07-12 (67 y.o. [name] Primary Care [name]: [name] Other Clinician: Karl [name] Referring Shaneese Tait: Treating [name]/Extender: [name], [name] in Treatment: 27 Vital Signs Time Taken: 13:03 Temperature (F): 97.5 [name], [name] ([id number]) [phone].pdf Page 2 of 2 Height (in): 72 Pulse (bpm): 98 Weight (lbs): 220 Respiratory Rate (breaths/min): 18 Body Mass Index (BMI): 29.8 Blood Pressure (mmHg): 154/96 Capillary Blood Glucose (mg/dl): 416 Reference Range: 80 - 120 mg / dl Electronic Signature(s) Signed: 10/21/2022 4:38:09 PM By: [name] EMT Entered By: [name] on 10/21/2022 16:38:09

## 2022-10-22 NOTE — Progress Notes (Signed)
TYRESSE, JAYSON A ([id number]) [id number] Page 1 of 2 Visit Report for 10/21/2022 Problem List Details Patient Name: Date of Service: [name], [name] 10/21/2022 2:00 PM Medical Record Number: [id number] Patient Account Number: [id number] Date of Birth/Sex: Treating RN: 25-Oct-1955 (67 y.o. [name], [name] Primary Care Provider: [name] Other Clinician: Karl [name] Referring Provider: Treating Provider/Extender: [name], [name] in Treatment: 27 Active Problems ICD-10 Encounter Code Description Active Date MDM Diagnosis L97.522 Non-pressure chronic ulcer of other part of left foot with fat layer 04/14/2022 No Yes exposed M86.172 Other acute osteomyelitis, left ankle and foot 05/08/2022 No Yes L97.528 Non-pressure chronic ulcer of other part of left foot with other 04/14/2022 No Yes specified severity E11.621 Type 2 diabetes mellitus with foot ulcer 04/14/2022 No Yes E11.42 Type 2 diabetes mellitus with diabetic polyneuropathy 04/14/2022 No Yes S91.301A Unspecified open wound, right foot, initial encounter 07/16/2022 No Yes 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 07/16/2022 No Yes specified severity Inactive Problems Resolved Problems Electronic Signature(s) Signed: 10/21/2022 4:44:58 PM By: [name] EMT Signed: 10/21/2022 6:42:55 PM By: [name] Derry PA-C Entered By: [name] on 10/21/2022 16:44:58 -------------------------------------------------------------------------------- SuperBill Details Patient Name: Date of Service: [name], [name] 10/21/2022 Medical Record Number: [id number] Patient Account Number: [id number] Date of Birth/Sex: Treating RN: [birth year] (67 y.o. [name] Primary Care Provider: [name] Other Clinician: Karl [name] Referring Provider: Treating Provider/Extender: [name], [name] in Treatment: [address] JAHLIL, ZILLER A ([id number]) [id number] Page 2 of 2 ICD-10 Codes Code Description [phone] Non-pressure chronic ulcer of other part of left foot with fat layer exposed M86.172 Other acute osteomyelitis, left ankle and foot L97.528 Non-pressure chronic ulcer of other part of left foot with other specified severity E11.621 Type 2 diabetes mellitus with foot ulcer E11.42 Type 2 diabetes mellitus with diabetic polyneuropathy S91.301A Unspecified open wound, right foot, initial encounter 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 severity Facility Procedures : CPT4 Code Description: [id number] G0277-(Facility Use Only) HBOT full body chamber, , ICD-10 Diagnosis Description E11.621 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.522 Non-pressure chronic ulcer of other part of left foot with fat lay L97.528  Non-pressure chronic ulcer of other part of left foot with other s M86.172 Other acute osteomyelitis, left ankle and foot Modifier: er exposed pecified severity Quantity: 4 Physician Procedures : CPT4 Code Description Modifier [id number] [phone] - WC PHYS HYPERBARIC OXYGEN THERAPY ICD-10 Diagnosis Description E11.621 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.528  Non-pressure chronic ulcer of other part of left foot with other specified severit M86.172 Other acute osteomyelitis, left ankle and foot Quantity: 1 y Psychologist, [facility] probation services) Signed: 10/21/2022 4:44:53 PM By: [name] EMT Signed: 10/21/2022 6:42:55 PM By: [name] Derry PA-C Entered By: [name] on 10/21/2022 16:44:52

## 2022-10-22 NOTE — Progress Notes (Signed)
LOUDON, KRAKOW A ([id number]) [id number] Page 1 of 8 Visit Report for 09/24/2022 Arrival Information Details Patient Name: Date of Service: [name], [place] [name] 09/24/2022 10:30 A M Medical Record Number: [id number] Patient Account Number: [id number] Date of Birth/Sex: Treating RN: 03/05/1956 (67 y.o. M) Primary Care Donyae Kilner: [name] Other Clinician: Referring Carmilla Granville: Treating Loic Hobin/Extender: Nelda Bucks in Treatment: 23 Visit Information History Since Last Visit Added or deleted any medications: No Patient Arrived: Walker Any new allergies or adverse reactions: No Arrival Time: 10:40 Had a fall or experienced change in No Accompanied By: self activities of daily living that may affect Transfer Assistance: None risk of falls: Patient Identification Verified: Yes Signs or symptoms of abuse/neglect since last visito No Secondary Verification Process Completed: Yes Hospitalized since last visit: No Patient Requires Transmission-Based Precautions: No Implantable device outside of the clinic excluding No Patient Has Alerts: Yes cellular tissue based products placed in the center since last visit: Has Dressing in Place as Prescribed: Yes Pain Present Now: No Electronic Signature(s) Signed: 09/28/2022 4:16:21 PM By: Thayer Dallas Entered By: Thayer Dallas on 09/24/2022 10:41:14 -------------------------------------------------------------------------------- Encounter Discharge Information Details Patient Name: Date of Service: [name], [name] 09/24/2022 10:30 A M Medical Record Number: [id number] Patient Account Number: [id number] Date of Birth/Sex: Treating RN: August 12, 1955 (67 y.o. [name] Primary Care Tiamarie Furnari: [name] Other Clinician: Referring Minnah Llamas: Treating Roxane Puerto/Extender: Nelda Bucks in Treatment: 23 Encounter Discharge Information Items Post Procedure  Vitals Discharge Condition: Stable Temperature (F): 97.5 Ambulatory Status: Ambulatory Pulse (bpm): 74 Discharge Destination: Home Respiratory Rate (breaths/min): 18 Transportation: Private Auto Blood Pressure (mmHg): 133/82 Accompanied By: self Schedule Follow-up Appointment: Yes Clinical Summary of Care: Patient Declined Electronic Signature(s) Signed: 10/22/2022 1:55:51 PM By: Brenton Grills Entered By: Brenton Grills on 09/24/2022 11:30:41 -------------------------------------------------------------------------------- Lower Extremity Assessment Details Patient Name: Date of Service: [name], [place] [name] 09/24/2022 10:30 A M Medical Record Number: [id number] Patient Account Number: [id number] Date of Birth/Sex: Treating RN: 06-27-1956 (67 y.o. M) Primary Care Nataleigh Griffin: [name] Other Clinician: Referring Elmarie Devlin: Treating Xzayvier Fagin/Extender: Mariam Dollar, [name] in Treatment: 23 Edema Assessment Assessed: [Left: No] [Right: No] T[LeftEDIBERTO, SENS A ([id number])] [Right: [id number] Page 2 of 8] Edema: [Left: No] [Right: No] Calf Left: Right: Point of Measurement: 36 cm From Medial Instep 40 cm 40 cm Ankle Left: Right: Point of Measurement: 9 cm From Medial Instep 22.5 cm 22.2 cm Electronic Signature(s) Signed: 09/28/2022 4:16:21 PM By: Thayer Dallas Entered By: Thayer Dallas on 09/24/2022 10:46:30 -------------------------------------------------------------------------------- Multi Wound Chart Details Patient Name: Date of Service: [name], [name] 09/24/2022 10:30 A M Medical Record Number: [id number] Patient Account Number: [id number] Date of Birth/Sex: Treating RN: 01/13/1956 (67 y.o. M) Primary Care Tanaka Gillen: [name] Other Clinician: Referring Pearson Picou: Treating Diamond Jentz/Extender: Mariam Dollar, [name] in Treatment: 23 Vital Signs Height(in): 72 Capillary Blood Glucose(mg/dl):  606 Weight(lbs): 301 Pulse(bpm): 74 Body Mass Index(BMI): 29.8 Blood Pressure(mmHg): 133/82 Temperature(F): 97.5 Respiratory Rate(breaths/min): 18 [5:Photos:] [N/A:N/A] Left, Lateral Foot Right Amputation Site - N/A Wound Location: Transmetatarsal Surgical Injury Gradually Appeared N/A Wounding Event: Diabetic Wound/Ulcer of the Lower Diabetic Wound/Ulcer of the Lower N/A Primary Etiology: Extremity Extremity Open Surgical Wound N/A N/A Secondary Etiology: Asthma, Sleep Apnea, Hypertension, Asthma, Sleep Apnea, Hypertension, N/A Comorbid History: Peripheral Venous Disease, Type II Peripheral Venous Disease, Type II Diabetes, Osteomyelitis, Neuropathy Diabetes, Osteomyelitis, Neuropathy 12/20/2021 07/16/2022 N/A Date Acquired: 23 10 N/A Weeks of  Treatment: Open Open N/A Wound Status: No No N/A Wound Recurrence: 2.4x0.2x0.3 0.2x0.2x0.3 N/A Measurements L x W x D (cm) 0.377 0.031 N/A A (cm) : rea 0.113 0.009 N/A Volume (cm) : 96.20% 96.80% N/A % Reduction in A rea: 97.70% 99.10% N/A % Reduction in Volume: Grade 3 Grade 2 N/A Classification: Medium Small N/A Exudate A mount: Serosanguineous Serosanguineous N/A Exudate Type: red, brown red, brown N/A Exudate Color: Epibole Distinct, outline attached N/A Wound Margin: Large (67-100%) Large (67-100%) N/A Granulation A mount: Red Red N/A Granulation Quality: Small (1-33%) None Present (0%) N/A Necrotic A mount: Fat Layer (Subcutaneous Tissue): Yes Fat Layer (Subcutaneous Tissue): Yes N/A Exposed Structures: Fascia: No Fascia: No Tendon: No Tendon: No Muscle: No Muscle: No Joint: No Joint: No Bone: No Bone: No Large (67-100%) Small (1-33%) N/A EpithelializationORVEL, CUTSFORTH A ([id number]) [id number] Page 3 of 8 Debridement - Selective/Open Wound Debridement - Selective/Open Wound N/A Debridement: 11:05 11:05 N/A Pre-procedure Verification/Time Out Taken: Lidocaine 4%  T opical Solution Lidocaine 4% T opical Solution N/A Pain Control: Callus, Slough Callus, Slough N/A Tissue Debrided: Non-Viable Tissue Skin/Epidermis N/A Level: 0.48 1 N/A Debridement A (sq cm): rea Curette Curette N/A Instrument: Minimum Minimum N/A Bleeding: Pressure Pressure N/A Hemostasis A chieved: 0 0 N/A Procedural Pain: 0 0 N/A Post Procedural Pain: Procedure was tolerated well Procedure was tolerated well N/A Debridement Treatment Response: 2.4x0.2x0.3 0.2x0.2x0.3 N/A Post Debridement Measurements L x W x D (cm) 0.113 0.009 N/A Post Debridement Volume: (cm) Callus: Yes Callus: Yes N/A Periwound Skin Texture: Excoriation: No Excoriation: No Induration: No Induration: No Crepitus: No Crepitus: No Rash: No Rash: No Scarring: No Scarring: No Maceration: No Dry/Scaly: Yes N/A Periwound Skin Moisture: Dry/Scaly: No Maceration: No Atrophie Blanche: No Atrophie Blanche: No N/A Periwound Skin Color: Cyanosis: No Cyanosis: No Ecchymosis: No Ecchymosis: No Erythema: No Erythema: No Hemosiderin Staining: No Hemosiderin Staining: No Mottled: No Mottled: No Pallor: No Pallor: No Rubor: No Rubor: No No Abnormality No Abnormality N/A Temperature: Yes N/A N/A Tenderness on Palpation: Cellular or Tissue Based Product Cellular or Tissue Based Product N/A Procedures Performed: Debridement Debridement Treatment Notes Wound #5 (Foot) Wound Laterality: Left, Lateral Cleanser Wound Cleanser Discharge Instruction: Cleanse the wound with wound cleanser prior to applying a clean dressing using gauze sponges, not tissue or cotton balls. Peri-Wound Care Skin Prep Discharge Instruction: Use skin prep as directed Topical Primary Dressing Cutimed Sorbact Swab Discharge Instruction: Apply to wound bed behind grafix Grafix Discharge Instruction: applied by Ronzell Laban. adaptic and steri-strips Discharge Instruction: to secure grafix to wound bed. leave in  place. Secondary Dressing ABD Pad, 8x10 Discharge Instruction: Apply over primary dressing as directed. Secured With [employer], 4.5x3.1 (in/yd) Discharge Instruction: Secure with Kerlix as directed. 12M Medipore H Soft Cloth Surgical T ape, 4 x 10 (in/yd) Discharge Instruction: Secure with tape as directed. Compression Wrap Compression Stockings Add-Ons Wound #6 (Amputation Site - Transmetatarsal) Wound Laterality: Right Cleanser Wound Cleanser TANNOR, PYON A ([id number]) [id number] Page 4 of 8 Discharge Instruction: Cleanse the wound with wound cleanser prior to applying a clean dressing using gauze sponges, not tissue or cotton balls. Peri-Wound Care Skin Prep Discharge Instruction: Use skin prep as directed Topical Primary Dressing Grafix Discharge Instruction: applied by Angelly Spearing. adaptic and steri-strips Discharge Instruction: to secure grafix to wound bed. leave in place. cutimed sorbact Discharge Instruction: lightly pack into wound bed behind the grafix. Secondary Dressing ABD Pad, 8x10 Discharge Instruction: Apply over primary dressing as directed. Secured With [employer]  Roll Sterile, 4.5x3.1 (in/yd) Discharge Instruction: Secure with Kerlix as directed. 24M Medipore H Soft Cloth Surgical T ape, 4 x 10 (in/yd) Discharge Instruction: Secure with tape as directed. Compression Wrap Compression Stockings Add-Ons Electronic Signature(s) Signed: 09/24/2022 1:29:58 PM By: Geralyn Corwin DO Entered By: Geralyn Corwin on 09/24/2022 11:37:50 -------------------------------------------------------------------------------- Multi-Disciplinary Care Plan Details Patient Name: Date of Service: [name], [name] 09/24/2022 10:30 A M Medical Record Number: [id number] Patient Account Number: [id number] Date of Birth/Sex: Treating RN: Jul 02, 1956 (67 y.o. [name] Primary Care Xitlalli Newhard: [name] Other Clinician: Referring  Rosan Calbert: Treating Anwar Crill/Extender: Nelda Bucks in Treatment: 23 Multidisciplinary Care Plan reviewed with physician Active Inactive Osteomyelitis Nursing Diagnoses: Infection: osteomyelitis Goals: Diagnostic evaluation for osteomyelitis completed as ordered Date Initiated: 04/23/2022 Target Resolution Date: 10/09/2022 Goal Status: Active Signs and symptoms for osteomyelitis will be recognized and promptly addressed Date Initiated: 04/23/2022 Target Resolution Date: 10/09/2022 Goal Status: Active Interventions: Assess for signs and symptoms of osteomyelitis resolution every visit Provide education on osteomyelitis Screen for HBO Treatment Activities: Biopsy : 04/16/2022 JARY, LOUVIER A ([id number]) [phone].pdf Page 5 of 8 Consult for HBO : 04/23/2022 Surgical debridement : 04/23/2022 Systemic antibiotics : 04/23/2022 T ordered outside of clinic : 04/23/2022 est Notes: Wound/Skin Impairment Nursing Diagnoses: Knowledge deficit related to ulceration/compromised skin integrity Goals: Patient/caregiver will verbalize understanding of skin care regimen Date Initiated: 04/14/2022 Target Resolution Date: 10/09/2022 Goal Status: Active Interventions: Assess patient/caregiver ability to perform ulcer/skin care regimen upon admission and as needed Assess ulceration(s) every visit Provide education on ulcer and skin care Notes: Electronic Signature(s) Signed: 10/22/2022 1:55:51 PM By: Brenton Grills Entered By: Brenton Grills on 09/24/2022 11:03:52 -------------------------------------------------------------------------------- Pain Assessment Details Patient Name: Date of Service: [name], [name] 09/24/2022 10:30 A M Medical Record Number: [id number] Patient Account Number: [id number] Date of Birth/Sex: Treating RN: [birth year] (67 y.o. M) Primary Care Emonee Winkowski: [name] Other Clinician: Referring Trent Gabler: Treating  Xochil Shanker/Extender: Mariam Dollar, [name] in Treatment: 23 Active Problems Location of Pain Severity and Description of Pain Patient Has Paino No Site Locations Pain Management and Medication Current Pain Management: Electronic Signature(s) Signed: 09/28/2022 4:16:21 PM By: Thayer Dallas Entered By: Thayer Dallas on 09/24/2022 10:41:35 [name] ([id number]) [id number] Page 6 of 8 -------------------------------------------------------------------------------- Patient/Caregiver Education Details Patient Name: Date of Service: [name], [place] [name] 3/21/2024andnbsp10:30 A M Medical Record Number: [id number] Patient Account Number: [id number] Date of Birth/Gender: Treating RN: 30-Jun-1956 (67 y.o. [name] Primary Care Physician: [name] Other Clinician: Referring Physician: Treating Physician/Extender: Nelda Bucks in Treatment: 23 Education Assessment Education Provided To: Patient Education Topics Provided Wound Debridement: Methods: Explain/Verbal Responses: Reinforcements needed, State content correctly Electronic Signature(s) Signed: 10/22/2022 1:55:51 PM By: Brenton Grills Entered By: Brenton Grills on 09/24/2022 11:04:31 -------------------------------------------------------------------------------- Wound Assessment Details Patient Name: Date of Service: [name], [name] 09/24/2022 10:30 A M Medical Record Number: [id number] Patient Account Number: [id number] Date of Birth/Sex: Treating RN: 1955/09/28 (67 y.o. M) Primary Care Ainara Eldridge: [name] Other Clinician: Referring Jazzlynn Rawe: Treating Zyonna Vardaman/Extender: Mariam Dollar, [name] in Treatment: 23 Wound Status Wound Number: 5 Primary Diabetic Wound/Ulcer of the Lower Extremity Etiology: Wound Location: Left, Lateral Foot Secondary Open Surgical Wound Wounding Event: Surgical Injury Etiology: Date  Acquired: 12/20/2021 Wound Open Weeks Of Treatment: 23 Status: Clustered Wound: No Comorbid Asthma, Sleep Apnea, Hypertension, Peripheral Venous Disease, History: Type II Diabetes, Osteomyelitis, Neuropathy Photos Wound Measurements Length: (cm) 2.4  Width: (cm) 0.2 Depth: (cm) 0.3 Area: (cm) 0.377 Volume: (cm) 0.113 % Reduction in Area: 96.2% % Reduction in Volume: 97.7% Epithelialization: Large (67-100%) Tunneling: No Undermining: No Wound Description Classification: Grade 3 Wound Margin: Epibole Exudate Amount: Medium Exudate Type: Serosanguineous Gheen, Lowen A ([id number]) Exudate Color: red, brown Foul Odor After Cleansing: No Slough/Fibrino Yes [phone].pdf Page 7 of 8 Wound Bed Granulation Amount: Large (67-100%) Exposed Structure Granulation Quality: Red Fascia Exposed: No Necrotic Amount: Small (1-33%) Fat Layer (Subcutaneous Tissue) Exposed: Yes Necrotic Quality: Adherent Slough Tendon Exposed: No Muscle Exposed: No Joint Exposed: No Bone Exposed: No Periwound Skin Texture Texture Color No Abnormalities Noted: No No Abnormalities Noted: Yes Callus: Yes Temperature / Pain Crepitus: No Temperature: No Abnormality Excoriation: No Tenderness on Palpation: Yes Induration: No Rash: No Scarring: No Moisture No Abnormalities Noted: No Dry / Scaly: No Maceration: No Electronic Signature(s) Signed: 09/28/2022 4:16:21 PM By: Thayer Dallas Entered By: Thayer Dallas on 09/24/2022 10:54:08 -------------------------------------------------------------------------------- Wound Assessment Details Patient Name: Date of Service: [name], [name] 09/24/2022 10:30 A M Medical Record Number: [id number] Patient Account Number: [id number] Date of Birth/Sex: Treating RN: [birth year] (67 y.o. M) Primary Care Maribeth Jiles: [name] Other Clinician: Referring Anvith Mauriello: Treating Boden Stucky/Extender: Mariam Dollar, [name]  in Treatment: 23 Wound Status Wound Number: 6 Primary Diabetic Wound/Ulcer of the Lower Extremity Etiology: Wound Location: Right Amputation Site - Transmetatarsal Wound Open Wounding Event: Gradually Appeared Status: Date Acquired: 07/16/2022 Comorbid Asthma, Sleep Apnea, Hypertension, Peripheral Venous Disease, Weeks Of Treatment: 10 History: Type II Diabetes, Osteomyelitis, Neuropathy Clustered Wound: No Photos Wound Measurements Length: (cm) 0.2 Width: (cm) 0.2 Depth: (cm) 0.3 Area: (cm) 0.031 Volume: (cm) 0.009 % Reduction in Area: 96.8% % Reduction in Volume: 99.1% Epithelialization: Small (1-33%) Tunneling: No Undermining: No Wound Description Classification: Grade 2 Kathol, Karthikeya A ([id number]) Wound Margin: Distinct, outline attached Exudate Amount: Small Exudate Type: Serosanguineous Exudate Color: red, brown Foul Odor After Cleansing: No [id number] Page 8 of 8 Slough/Fibrino Yes Wound Bed Granulation Amount: Large (67-100%) Exposed Structure Granulation Quality: Red Fascia Exposed: No Necrotic Amount: None Present (0%) Fat Layer (Subcutaneous Tissue) Exposed: Yes Tendon Exposed: No Muscle Exposed: No Joint Exposed: No Bone Exposed: No Periwound Skin Texture Texture Color No Abnormalities Noted: No No Abnormalities Noted: Yes Callus: Yes Temperature / Pain Crepitus: No Temperature: No Abnormality Excoriation: No Induration: No Rash: No Scarring: No Moisture No Abnormalities Noted: No Dry / Scaly: Yes Maceration: No Electronic Signature(s) Signed: 09/28/2022 4:16:21 PM By: Thayer Dallas Entered By: Thayer Dallas on 09/24/2022 10:54:45 -------------------------------------------------------------------------------- Vitals Details Patient Name: Date of Service: [name], [name] 09/24/2022 10:30 A M Medical Record Number: [id number] Patient Account Number: [id number] Date of Birth/Sex: Treating RN: 01/08/1956  (67 y.o. M) Primary Care Jashayla Glatfelter: [name] Other Clinician: Haywood Pao Referring Stacy Sailer: Treating Callista Hoh/Extender: Mariam Dollar, [name] in Treatment: 23 Vital Signs Time Taken: 10:38 Temperature (F): 97.5 Height (in): 72 Pulse (bpm): 74 Weight (lbs): 220 Respiratory Rate (breaths/min): 18 Body Mass Index (BMI): 29.8 Blood Pressure (mmHg): 133/82 Capillary Blood Glucose (mg/dl): 093 Reference Range: 80 - 120 mg / dl Electronic Signature(s) Signed: 09/25/2022 8:52:56 AM By: Haywood Pao CHT EMT BS , , Entered By: Haywood Pao on 09/24/2022 10:38:59

## 2022-10-22 NOTE — Progress Notes (Addendum)
AXEL, LUIKART A ([id number]) [id number] Page 1 of 2 Visit Report for 10/21/2022 HBO Details Patient Name: Date of Service: [name], [place] [name] 10/21/2022 2:00 PM Medical Record Number: [id number] Patient Account Number: [id number] Date of Birth/Sex: Treating RN: [birth year] (67 y.o. [name], [name] Primary Care [name]: [name] Other Clinician: [name] Referring Hamish Banks: Treating [name]: [name], [name] in Treatment: 27 HBO Treatment Course Details Treatment Course Number: 1 Ordering Brinton Brandel: Geralyn Corwin T Treatments Ordered: otal 100 HBO Treatment Start 06/01/2022 Date: HBO Indication: Diabetic Ulcer(s) of the Lower Extremity HBO Treatment End 10/21/2022 Date: Standard/Conservative Wound Care tried and failed greater than or equal to 30 days HBO Discharge Treatment Series Incomplete; Patient Choice- Social Outcome: Determinants HBO Treatment Details Treatment Number: 85 Patient Type: Outpatient Chamber Type: Monoplace Chamber Serial #: [id number] Treatment Protocol: 2.5 ATA with 90 minutes oxygen, with two 5 minute air breaks Treatment Details Compression Rate Down: 2.0 psi / minute De-Compression Rate Up: 2.0 psi / minute A breaks and breathing ir Compress Tx Pressure periods Decompress Decompress Begins Reached (leave unused spaces Begins Ends blank) Chamber Pressure (ATA 1 2.5 2.5 2.5 2.5 2.5 - - 2.5 1 ) Clock Time (24 hr) 14:06 14:20 14:51 14:56 15:26 15:31 - - 16:01 16:10 Treatment Length: 124 (minutes) Treatment Segments: 4 Vital Signs Capillary Blood Glucose Reference Range: 80 - 120 mg / dl HBO Diabetic Blood Glucose Intervention Range: <131 mg/dl or >782 mg/dl Time Vitals Blood Respiratory Capillary Blood Glucose Pulse Action Type: Pulse: Temperature: Taken: Pressure: Rate: Glucose (mg/dl): Meter #: Oximetry (%) Taken: Pre 13:03 154/96 98 18 97.5 331 Post 16:16 162/87 86 18 97.4  198 Treatment Response Treatment Toleration: Well Treatment Completion Status: Treatment Completed without Adverse Event Additional Procedure Documentation Tissue Sevierity: Necrosis of bone Electronic Signature(s) Signed: 11/24/2022 5:15:28 PM By: [name], [name] Signed: 01/04/2023 3:13:10 PM By: [name] Previous Signature: 10/21/2022 4:44:18 PM Version By: [name] EMT Previous Signature: 10/21/2022 6:42:55 PM Version By: [name] Entered By: Shawn Stall on 11/24/2022 17:15:27 [name] ([id number]) [id number] Page 2 of 2 -------------------------------------------------------------------------------- HBO Safety Checklist Details Patient Name: Date of Service: [name], [place] [name] 10/21/2022 2:00 PM Medical Record Number: [id number] Patient Account Number: [id number] Date of Birth/Sex: Treating RN: 1955/10/19 (67 y.o. [name], Millard.Loa Primary Care Lenita Peregrina: [name] Other Clinician: [name] Referring Emmalie Haigh: Treating Lakina Mcintire/Extender: [name], [name] in Treatment: 27 HBO Safety Checklist Items Safety Checklist Consent Form Signed Patient voided / foley secured and emptied When did you last eato 1245 Last dose of injectable or oral agent 0630 Ostomy pouch emptied and vented if applicable NA All implantable devices assessed, documented and approved NA Intravenous access site secured and place NA Valuables secured Linens and cotton and cotton/polyester blend (less than 51% polyester) Personal oil-based products / skin lotions / body lotions removed Wigs or hairpieces removed NA Smoking or tobacco materials removed Books / newspapers / magazines / loose paper removed Cologne, aftershave, perfume and deodorant removed Jewelry removed (may wrap wedding band) Make-up removed NA Hair care products removed Battery operated devices (external) removed Heating patches and chemical  warmers removed Titanium eyewear removed NA Nail polish cured greater than 10 hours NA Casting material cured greater than 10 hours NA Hearing aids removed NA Loose dentures or partials removed removed by patient Prosthetics have been removed NA Patient demonstrates correct use of air break device (if applicable) Patient concerns have  been addressed Patient grounding bracelet on and cord attached to chamber Specifics for Inpatients (complete in addition to above) Medication sheet sent with patient NA Intravenous medications needed or due during therapy sent with patient NA Drainage tubes (e.g. nasogastric tube or chest tube secured and vented) NA Endotracheal or Tracheotomy tube secured NA Cuff deflated of air and inflated with saline NA Airway suctioned NA Notes The safety checklist was done before the treatment was started. Electronic Signature(s) Signed: 10/21/2022 4:42:53 PM By: [name] EMT Entered By: [name] on 10/21/2022 16:42:52

## 2022-10-23 ENCOUNTER — Encounter (HOSPITAL_BASED_OUTPATIENT_CLINIC_OR_DEPARTMENT_OTHER): Payer: Medicare HMO | Admitting: Internal Medicine

## 2022-10-26 ENCOUNTER — Encounter (HOSPITAL_BASED_OUTPATIENT_CLINIC_OR_DEPARTMENT_OTHER): Payer: Medicare HMO | Admitting: Internal Medicine

## 2022-10-27 ENCOUNTER — Encounter (HOSPITAL_BASED_OUTPATIENT_CLINIC_OR_DEPARTMENT_OTHER): Payer: Medicare HMO | Admitting: Internal Medicine

## 2022-10-28 ENCOUNTER — Encounter (HOSPITAL_BASED_OUTPATIENT_CLINIC_OR_DEPARTMENT_OTHER): Payer: Medicare HMO | Admitting: Physician Assistant

## 2022-10-29 ENCOUNTER — Encounter (HOSPITAL_BASED_OUTPATIENT_CLINIC_OR_DEPARTMENT_OTHER): Payer: Medicare HMO | Admitting: Internal Medicine

## 2022-10-30 ENCOUNTER — Ambulatory Visit (HOSPITAL_COMMUNITY): Admission: RE | Admit: 2022-10-30 | Payer: Medicare HMO | Source: Ambulatory Visit

## 2022-10-30 ENCOUNTER — Ambulatory Visit: Payer: Medicare HMO | Admitting: Vascular Surgery

## 2022-10-30 ENCOUNTER — Encounter (HOSPITAL_BASED_OUTPATIENT_CLINIC_OR_DEPARTMENT_OTHER): Payer: Medicare HMO | Admitting: Internal Medicine

## 2022-10-30 ENCOUNTER — Ambulatory Visit (HOSPITAL_COMMUNITY): Payer: Medicare HMO

## 2022-11-02 ENCOUNTER — Encounter (HOSPITAL_BASED_OUTPATIENT_CLINIC_OR_DEPARTMENT_OTHER): Payer: Medicare HMO | Admitting: Internal Medicine

## 2022-11-03 ENCOUNTER — Encounter (HOSPITAL_BASED_OUTPATIENT_CLINIC_OR_DEPARTMENT_OTHER): Payer: Medicare HMO | Admitting: Internal Medicine

## 2022-11-19 NOTE — Progress Notes (Signed)
ESTIN, LAMSON A ([id number]) [id number] Page 1 of 1 Visit Report for 10/05/2022 SuperBill Details Patient Name: Date of Service: [name], [place] [name] 10/05/2022 Medical Record Number: [id number] Patient Account Number: [id number] Date of Birth/Sex: Treating RN: 20-Jul-1955 (67 y.o. [name], [name] Primary Care Provider: [name] Other Clinician: Haywood Pao Referring Provider: Treating Provider/Extender: Nelda Bucks in Treatment: 24 Diagnosis Coding ICD-10 Codes Code Description [phone] Non-pressure chronic ulcer of other part of left foot with fat layer exposed M86.172 Other acute osteomyelitis, left ankle and foot L97.528 Non-pressure chronic ulcer of other part of left foot with other specified severity E11.621 Type 2 diabetes mellitus with foot ulcer E11.42 Type 2 diabetes mellitus with diabetic polyneuropathy S91.301A Unspecified open wound, right foot, initial encounter 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 severity Facility Procedures CPT4 Code Description Modifier Quantity [id number] G0277-(Facility Use Only) HBOT full body chamber, , 4 Physician Procedures Quantity CPT4 Code Description Modifier [id number] - WC PHYS HYPERBARIC OXYGEN THERAPY 1 ICD-10 Diagnosis Description M86.172 Other acute osteomyelitis, left ankle and foot Electronic Signature(s) Signed: 10/05/2022 12:44:10 PM By: Duanne Guess MD FACS Signed: 11/19/2022 4:22:18 PM By: Geralyn Corwin DO Entered By: Duanne Guess on 10/05/2022 12:44:09

## 2022-11-19 NOTE — Progress Notes (Signed)
EAGLE, KILDUFF A ([id number]) [id number] Page 1 of 2 Visit Report for 10/05/2022 HBO Details Patient Name: Date of Service: [name], [place] [name] 10/05/2022 8:00 A M Medical Record Number: [id number] Patient Account Number: [id number] Date of Birth/Sex: Treating RN: 1955/10/24 (67 y.o. [name], [name] Primary Care Sophia Sperry: [name] Other Clinician: Haywood Pao Referring Ahmiyah Coil: Treating Maynor Mwangi/Extender: Nelda Bucks in Treatment: 24 HBO Treatment Course Details Treatment Course Number: 1 Ordering Loann Chahal: Geralyn Corwin T Treatments Ordered: otal 80 HBO Treatment Start Date: 06/01/2022 HBO Indication: Diabetic Ulcer(s) of the Lower Extremity Standard/Conservative Wound Care tried and failed greater than or equal to 30 days HBO Treatment Details Treatment Number: 76 Patient Type: Outpatient Chamber Type: Monoplace Chamber Serial #: [id number] Treatment Protocol: 2.5 ATA with 90 minutes oxygen, with two 5 minute air breaks Treatment Details Compression Rate Down: 2.0 psi / minute De-Compression Rate Up: 2.0 psi / minute A breaks and breathing ir Compress Tx Pressure periods Decompress Decompress Begins Reached (leave unused spaces Begins Ends blank) Chamber Pressure (ATA 1 2.5 2.5 2.5 2.5 2.5 - - 2.5 1 ) Clock Time (24 hr) 08:03 08:19 08:49 08:54 09:24 09:29 - - 09:59 10:09 Treatment Length: 126 (minutes) Treatment Segments: 4 Vital Signs Capillary Blood Glucose Reference Range: 80 - 120 mg / dl HBO Diabetic Blood Glucose Intervention Range: <131 mg/dl or >782 mg/dl Time Vitals Blood Respiratory Capillary Blood Glucose Pulse Action Type: Pulse: Temperature: Taken: Pressure: Rate: Glucose (mg/dl): Meter #: Oximetry (%) Taken: Pre 07:31 130/81 97 17 97.7 181 Post 10:15 151/96 84 17 97 127 Treatment Response Treatment Toleration: Well Treatment Completion Status: Treatment Completed without Adverse  Event Physician HBO Attestation: I certify that I supervised this HBO treatment in accordance with Medicare guidelines. A trained emergency response team is readily available per Yes hospital policies and procedures. Continue HBOT as ordered. Yes Electronic Signature(s) Signed: 10/05/2022 12:43:53 PM By: Duanne Guess MD FACS Signed: 11/19/2022 4:22:18 PM By: Geralyn Corwin DO Entered By: Duanne Guess on 10/05/2022 12:43:52 -------------------------------------------------------------------------------- HBO Safety Checklist Details Patient Name: Date of Service: [name], [name] 10/05/2022 8:00 A M Medical Record Number: [id number] Patient Account Number: [id number] Date of Birth/Sex: Treating RN: 12/01/1955 (67 y.o. [name], [name] Primary Care Shamra Bradeen: [name] Other Clinician: Haywood Pao Referring Samik Balkcom: Treating Fia Hebert/Extender: Nelda Bucks in Treatment: [address] A ([id number]) [id number] Page 2 of 2 HBO Safety Checklist Items Safety Checklist Consent Form Signed Patient voided / foley secured and emptied When did you last eato 0710 Last dose of injectable or oral agent 0630 Ostomy pouch emptied and vented if applicable NA All implantable devices assessed, documented and approved NA Intravenous access site secured and place NA Valuables secured Linens and cotton and cotton/polyester blend (less than 51% polyester) Personal oil-based products / skin lotions / body lotions removed NA Wigs or hairpieces removed NA Smoking or tobacco materials removed NA Books / newspapers / magazines / loose paper removed NA Cologne, aftershave, perfume and deodorant removed NA Jewelry removed (may wrap wedding band) NA Make-up removed NA Hair care products removed NA Battery operated devices (external) removed NA Heating patches and chemical warmers removed NA Titanium eyewear  removed NA Nail polish cured greater than 10 hours NA Casting material cured greater than 10 hours NA Hearing aids removed NA Loose dentures or partials removed NA Prosthetics have been removed NA Patient demonstrates correct use of air break device (if applicable) Patient concerns have been addressed  Patient grounding bracelet on and cord attached to chamber Specifics for Inpatients (complete in addition to above) Medication sheet sent with patient Intravenous medications needed or due during therapy sent with patient Drainage tubes (e.g. nasogastric tube or chest tube secured and vented) Endotracheal or Tracheotomy tube secured Cuff deflated of air and inflated with saline Airway suctioned Electronic Signature(s) Signed: 10/07/2022 4:24:30 PM By: Fonnie Mu RN Entered By: Fonnie Mu on 10/05/2022 16:10:96

## 2023-06-13 IMAGING — CT CT HEAD W/O CM
3 series · 15 of 47 positions shown, 18 images · non-contrast
Comparison: None Available.

CLINICAL DATA: Syncope.



[Series 3: head wo · axial · 0.47mm/px · z∈[+1448,+1598]mm · 9 of 36 slices shown, 12 images]
[im 3/36  brain]
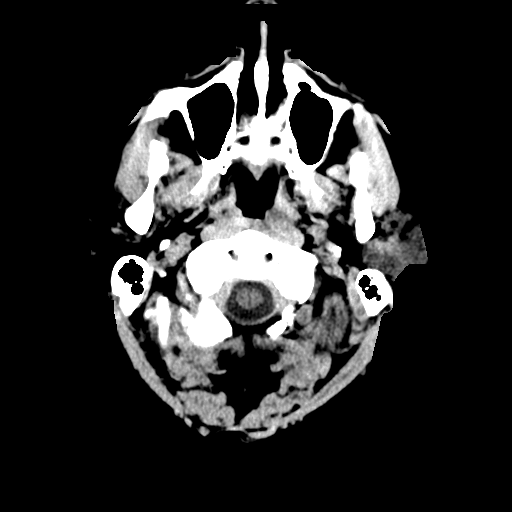
[im 3/36  bone]
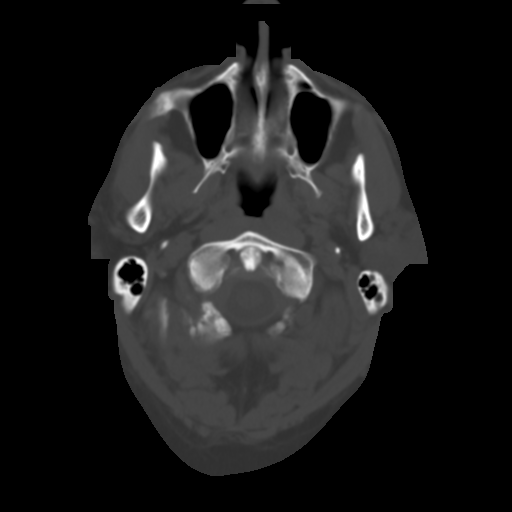
[im 7/36  brain]
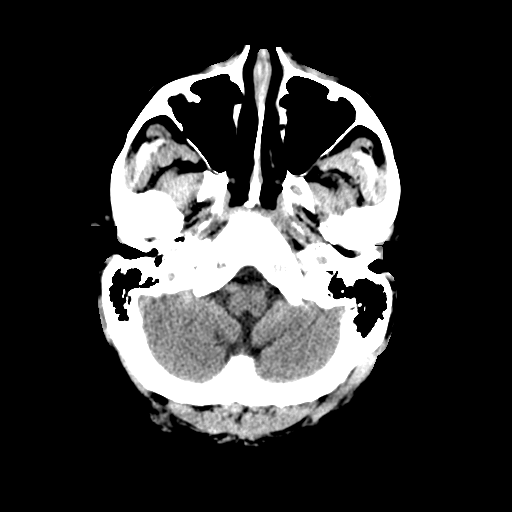
[im 10/36  brain]
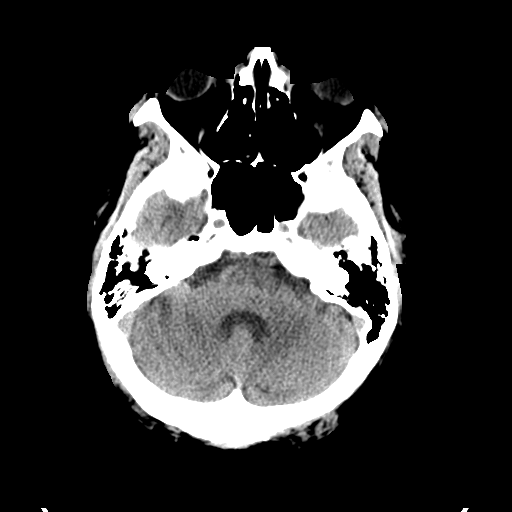
[im 14/36  brain]
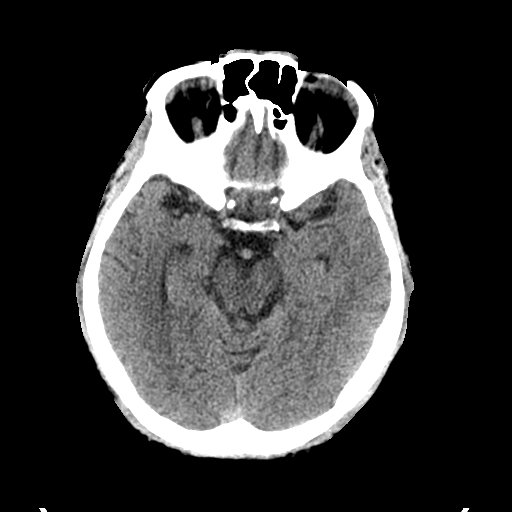
[im 19/36  brain]
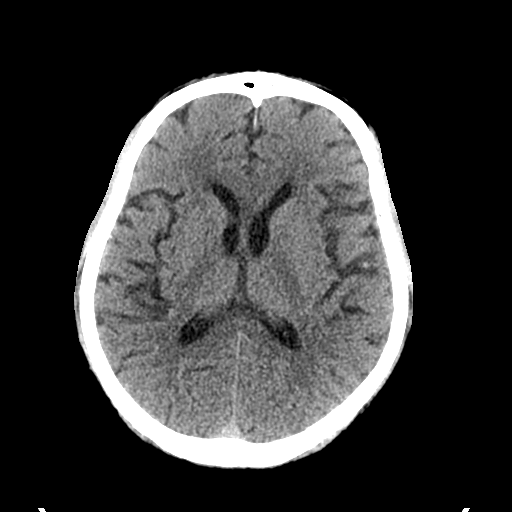
[im 19/36  bone]
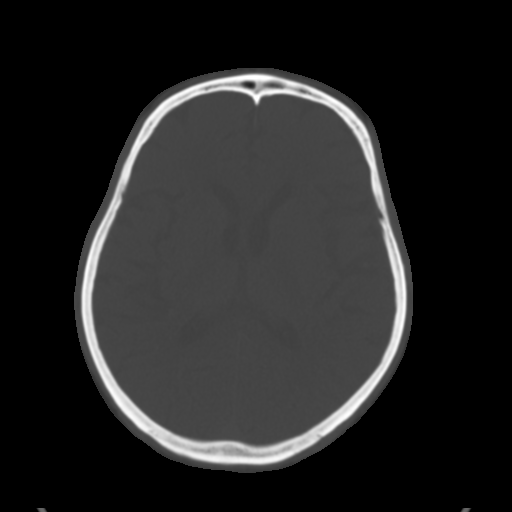
[im 22/36  brain]
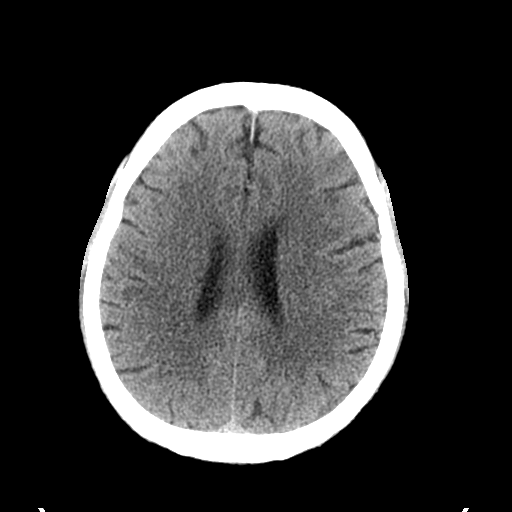
[im 26/36  brain]
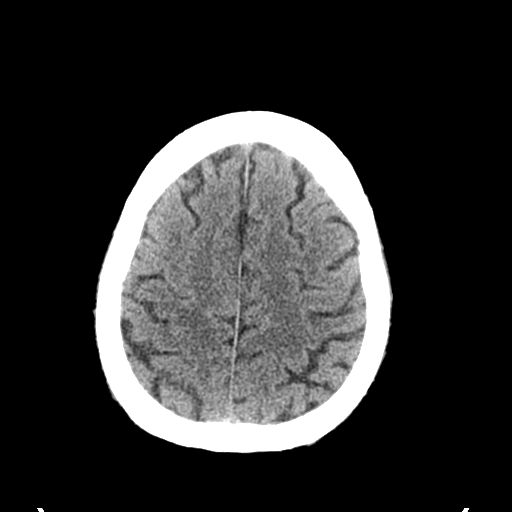
[im 29/36  brain]
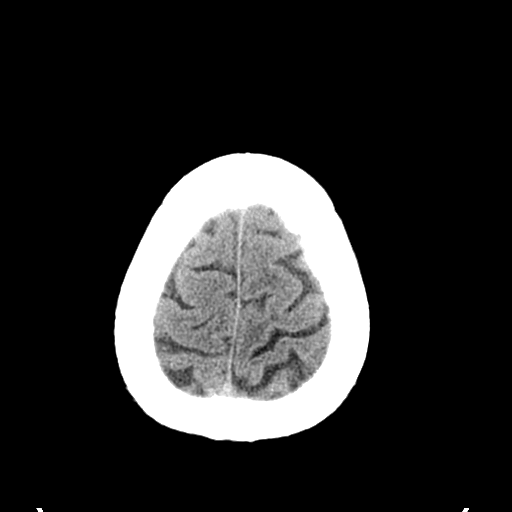
[im 33/36  brain]
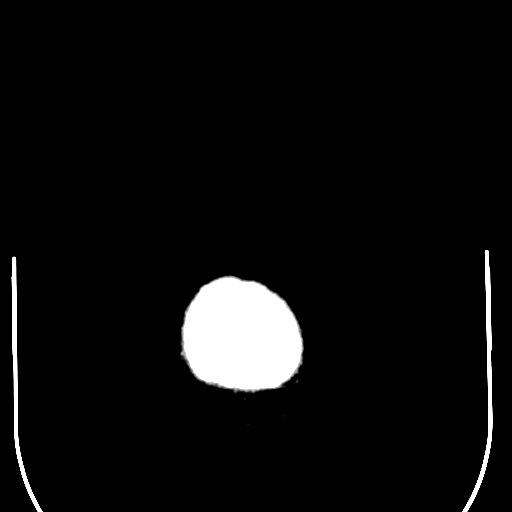
[im 33/36  bone]
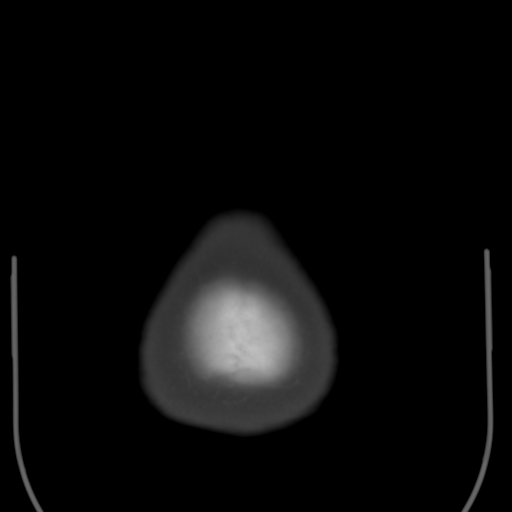

[Series 6: coronal soft tissue · coronal · 0.35mm/px · 3 of 73 slices shown]
[im 25/73  brain]
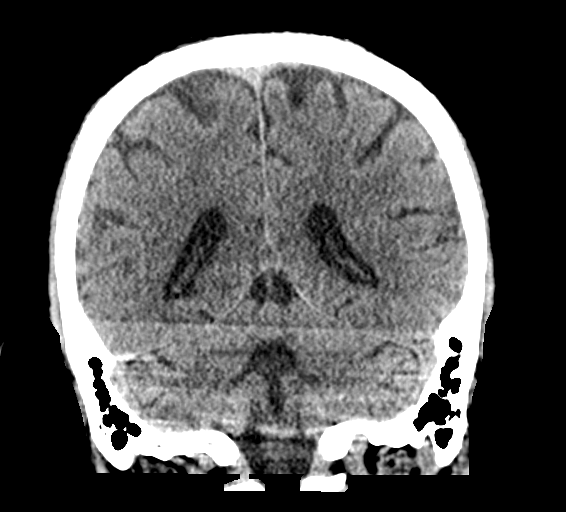
[im 33/73  brain]
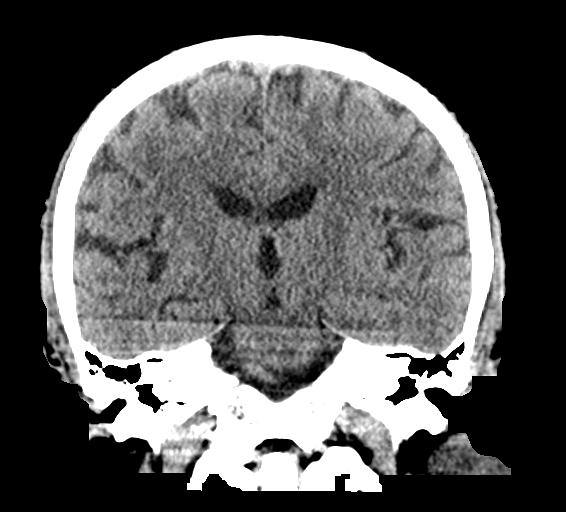
[im 41/73  brain]
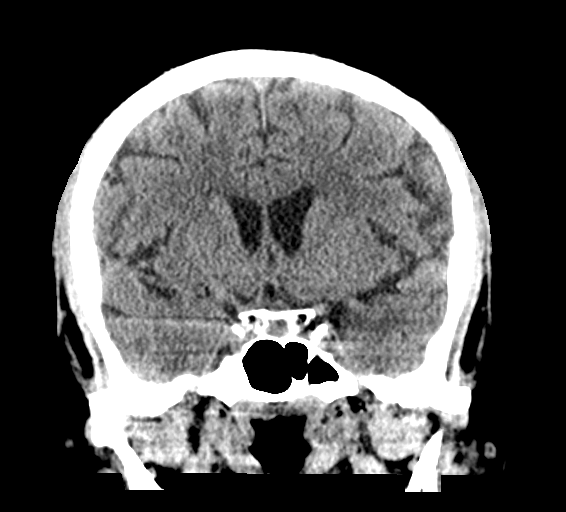

[Series 7: sagittal soft tissue · sagittal · 0.37mm/px · 3 of 62 slices shown]
[im 21/62  brain]
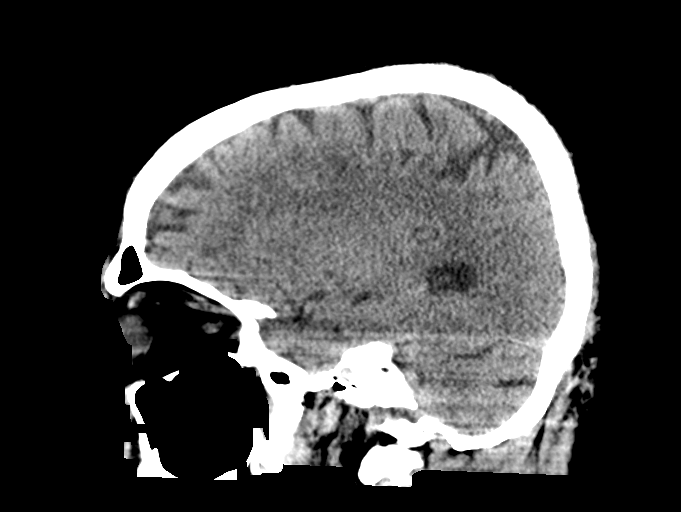
[im 31/62  brain]
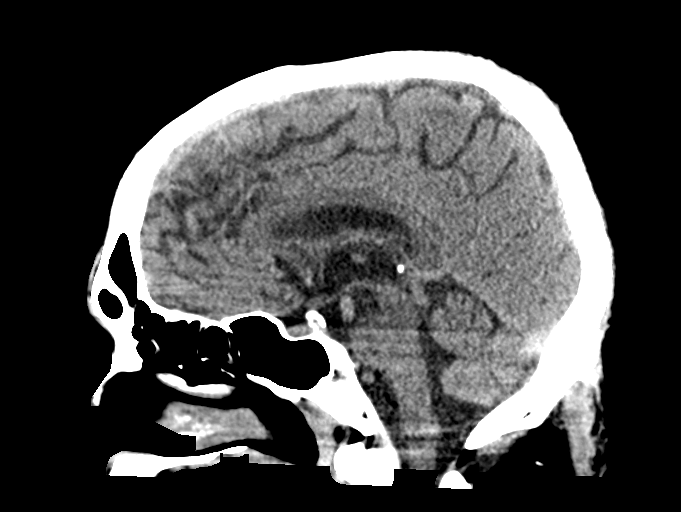
[im 41/62  brain]
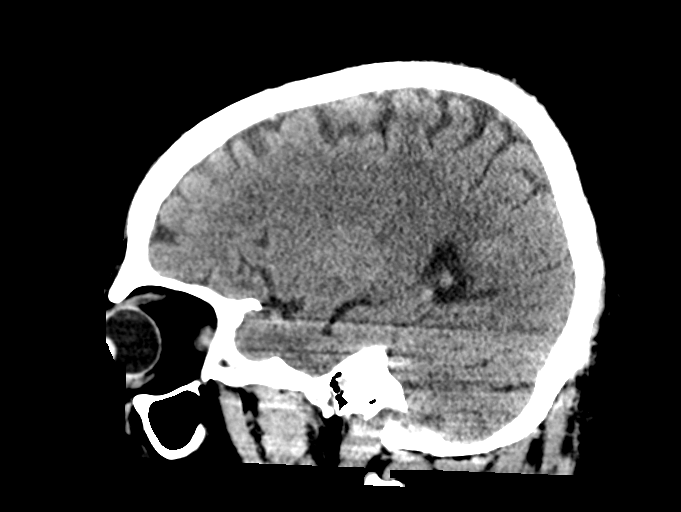

[15 of 47 positions shown; findings below may reference images not displayed]

FINDINGS: Brain: No evidence of acute infarction, hemorrhage, hydrocephalus,
extra-axial collection or mass lesion/mass effect. Mild atrophy.

Vascular: No hyperdense vessel or unexpected calcification.

Skull: Normal. Negative for fracture or focal lesion.

Sinuses/Orbits: No acute finding.

Other: None
IMPRESSION: No acute intracranial process.

## 2023-06-13 IMAGING — DX DG CHEST 1V PORT
2 series · 2 of 2 positions shown · non-contrast
Comparison: Radiograph April 05, 2021

CLINICAL DATA: Weakness

EXAM:
PORTABLE CHEST 1 VIEW

[chest ap (1 of 2)]
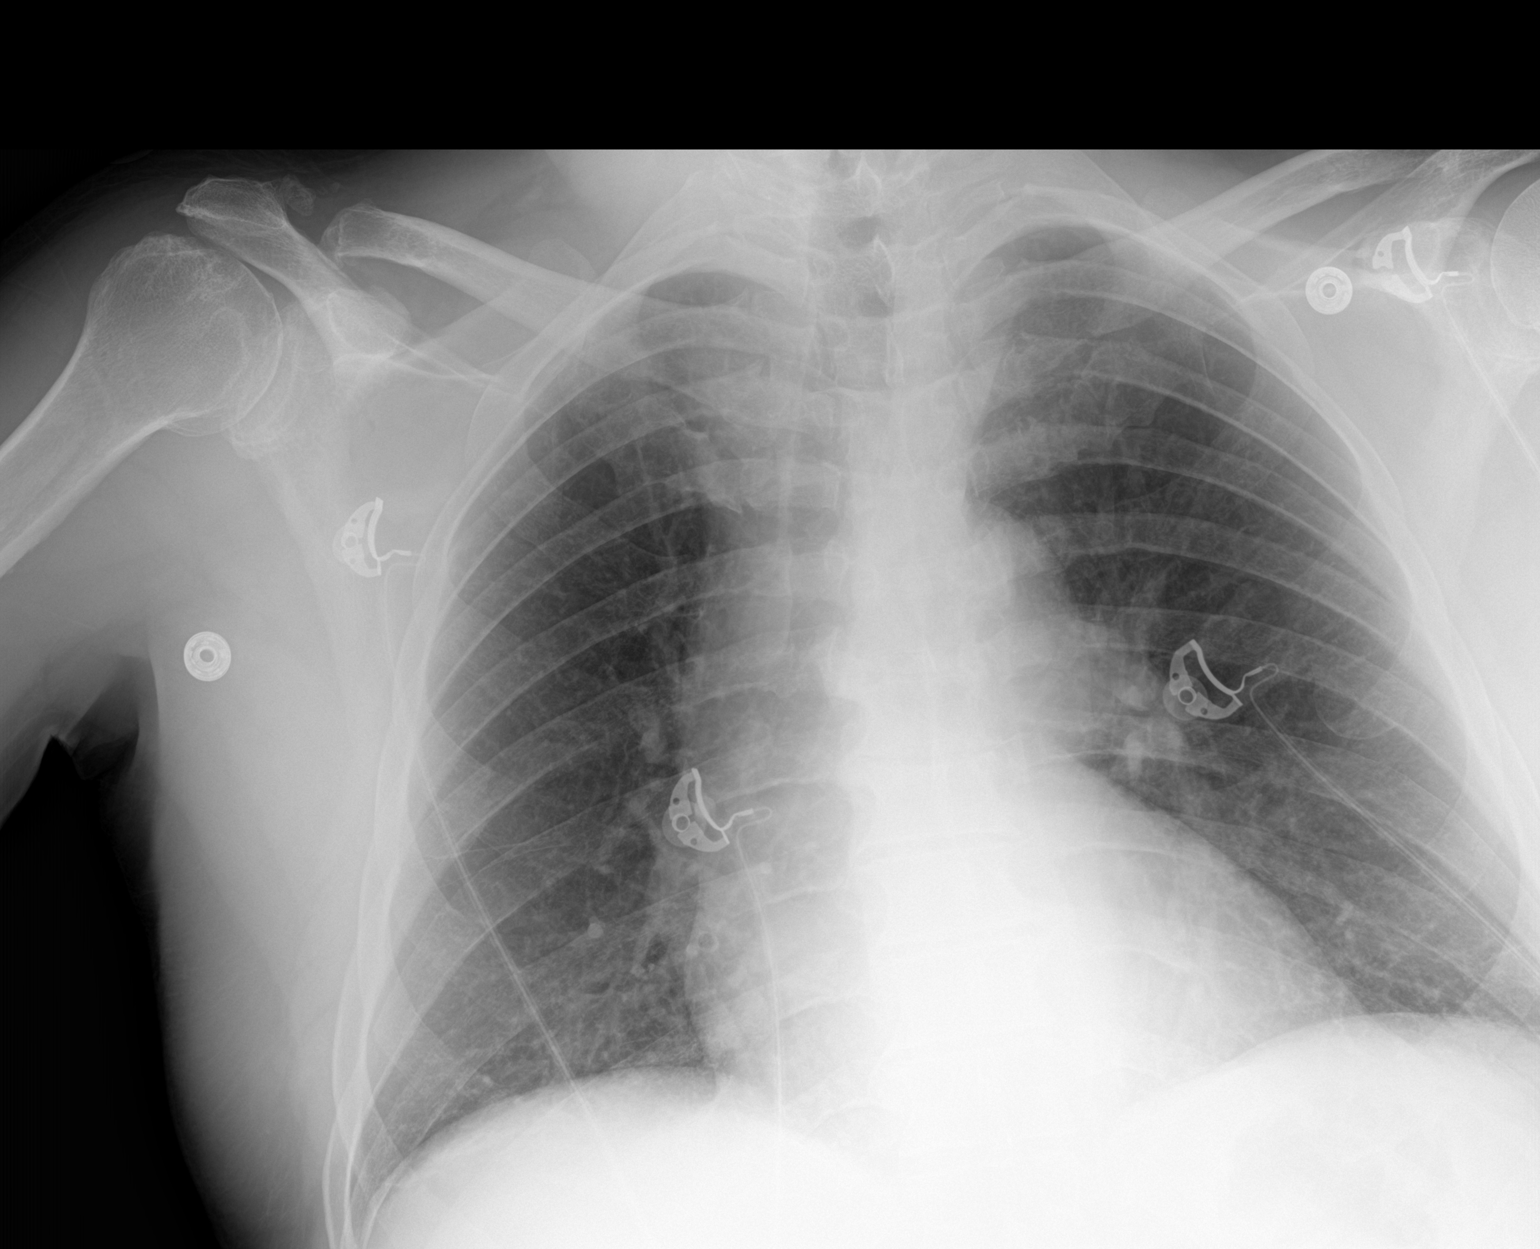

[chest ap (2 of 2)]
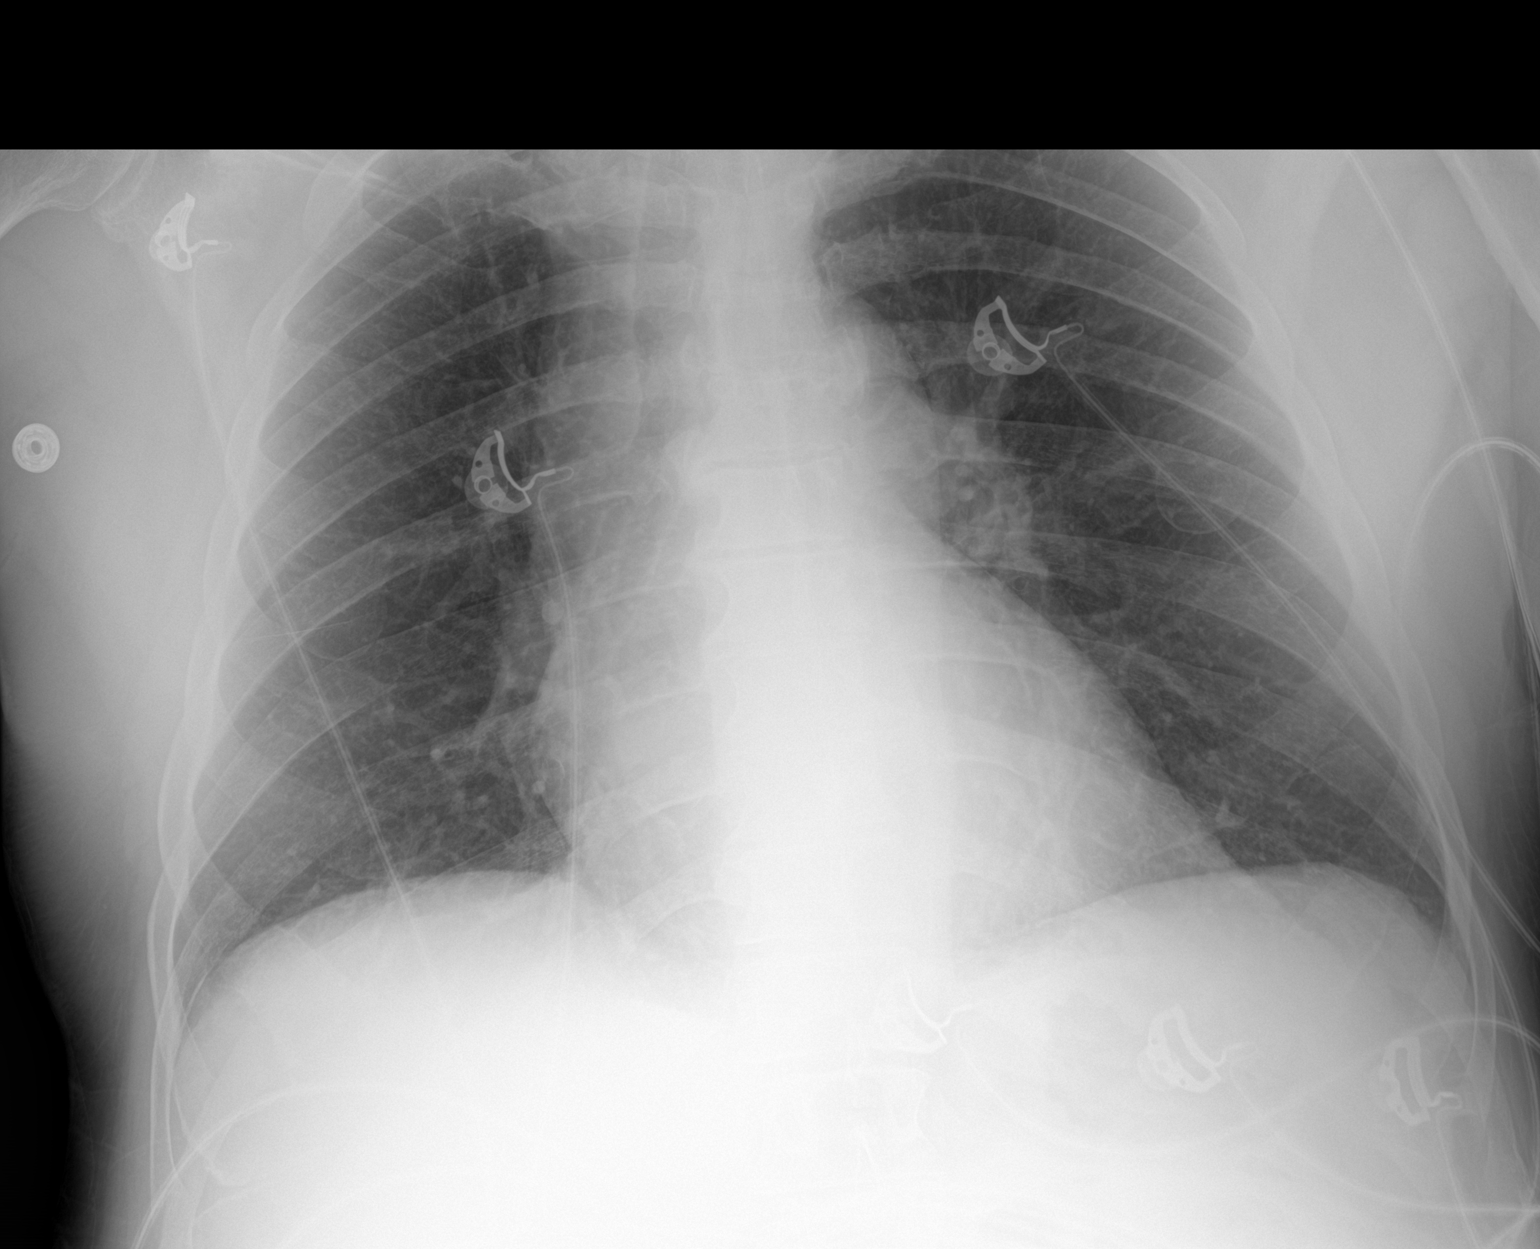

[2 of 2 positions shown; findings below may reference images not displayed]

FINDINGS: The heart size and mediastinal contours are within normal limits. No
focal airspace consolidation. No visible pleural effusion or
pneumothorax. The visualized skeletal structures are unremarkable.
IMPRESSION: No acute cardiopulmonary disease.

## 2023-09-27 ENCOUNTER — Other Ambulatory Visit: Payer: Self-pay | Admitting: Vascular Surgery

## 2023-09-27 DIAGNOSIS — I70223 Atherosclerosis of native arteries of extremities with rest pain, bilateral legs: Secondary | ICD-10-CM
